# Patient Record
Sex: Male | Born: 1975 | Hispanic: Yes | Marital: Single | Smoking: Current every day smoker
Health system: Southern US, Community
[De-identification: ages and names within clinical notes are randomized; demographics above are authoritative.]

## PROBLEM LIST (undated history)

## (undated) DIAGNOSIS — R0989 Other specified symptoms and signs involving the circulatory and respiratory systems: Secondary | ICD-10-CM

## (undated) DIAGNOSIS — I10 Essential (primary) hypertension: Secondary | ICD-10-CM

## (undated) DIAGNOSIS — F209 Schizophrenia, unspecified: Secondary | ICD-10-CM

## (undated) DIAGNOSIS — J45909 Unspecified asthma, uncomplicated: Secondary | ICD-10-CM

## (undated) DIAGNOSIS — F419 Anxiety disorder, unspecified: Secondary | ICD-10-CM

## (undated) HISTORY — DX: Anxiety disorder, unspecified: F41.9

## (undated) HISTORY — DX: Other specified symptoms and signs involving the circulatory and respiratory systems: R09.89

## (undated) HISTORY — DX: Essential (primary) hypertension: I10

---

## 2002-01-23 ENCOUNTER — Inpatient Hospital Stay (HOSPITAL_COMMUNITY): Admission: EM | Admit: 2002-01-23 | Discharge: 2002-01-30 | Payer: Self-pay | Admitting: *Deleted

## 2002-01-27 ENCOUNTER — Encounter (HOSPITAL_COMMUNITY): Payer: Self-pay | Admitting: Psychiatry

## 2002-02-17 ENCOUNTER — Emergency Department (HOSPITAL_COMMUNITY): Admission: EM | Admit: 2002-02-17 | Discharge: 2002-02-18 | Payer: Self-pay | Admitting: Emergency Medicine

## 2002-02-18 ENCOUNTER — Inpatient Hospital Stay (HOSPITAL_COMMUNITY): Admission: EM | Admit: 2002-02-18 | Discharge: 2002-02-21 | Payer: Self-pay | Admitting: Psychiatry

## 2002-02-18 ENCOUNTER — Encounter: Payer: Self-pay | Admitting: Emergency Medicine

## 2002-04-23 ENCOUNTER — Inpatient Hospital Stay (HOSPITAL_COMMUNITY): Admission: EM | Admit: 2002-04-23 | Discharge: 2002-05-01 | Payer: Self-pay | Admitting: Psychiatry

## 2002-05-08 ENCOUNTER — Inpatient Hospital Stay (HOSPITAL_COMMUNITY): Admission: EM | Admit: 2002-05-08 | Discharge: 2002-05-17 | Payer: Self-pay | Admitting: Psychiatry

## 2002-05-18 ENCOUNTER — Emergency Department (HOSPITAL_COMMUNITY): Admission: EM | Admit: 2002-05-18 | Discharge: 2002-05-18 | Payer: Self-pay | Admitting: Emergency Medicine

## 2002-06-01 ENCOUNTER — Emergency Department (HOSPITAL_COMMUNITY): Admission: EM | Admit: 2002-06-01 | Discharge: 2002-06-01 | Payer: Self-pay | Admitting: Emergency Medicine

## 2002-09-19 ENCOUNTER — Emergency Department (HOSPITAL_COMMUNITY): Admission: EM | Admit: 2002-09-19 | Discharge: 2002-09-19 | Payer: Self-pay | Admitting: Emergency Medicine

## 2004-09-09 ENCOUNTER — Ambulatory Visit: Payer: Self-pay | Admitting: Psychiatry

## 2004-09-09 ENCOUNTER — Inpatient Hospital Stay (HOSPITAL_COMMUNITY): Admission: RE | Admit: 2004-09-09 | Discharge: 2004-09-18 | Payer: Self-pay | Admitting: Psychiatry

## 2005-02-25 ENCOUNTER — Inpatient Hospital Stay (HOSPITAL_COMMUNITY): Admission: AD | Admit: 2005-02-25 | Discharge: 2005-03-05 | Payer: Self-pay | Admitting: Psychiatry

## 2005-02-26 ENCOUNTER — Ambulatory Visit: Payer: Self-pay | Admitting: Psychiatry

## 2005-03-11 ENCOUNTER — Inpatient Hospital Stay (HOSPITAL_COMMUNITY): Admission: EM | Admit: 2005-03-11 | Discharge: 2005-03-17 | Payer: Self-pay | Admitting: Psychiatry

## 2005-03-22 ENCOUNTER — Inpatient Hospital Stay (HOSPITAL_COMMUNITY): Admission: RE | Admit: 2005-03-22 | Discharge: 2005-04-02 | Payer: Self-pay | Admitting: Psychiatry

## 2005-03-25 ENCOUNTER — Ambulatory Visit: Payer: Self-pay | Admitting: Psychiatry

## 2005-04-28 ENCOUNTER — Inpatient Hospital Stay (HOSPITAL_COMMUNITY): Admission: EM | Admit: 2005-04-28 | Discharge: 2005-05-04 | Payer: Self-pay | Admitting: Psychiatry

## 2005-04-28 ENCOUNTER — Ambulatory Visit: Payer: Self-pay | Admitting: Psychiatry

## 2010-12-25 DIAGNOSIS — F329 Major depressive disorder, single episode, unspecified: Secondary | ICD-10-CM

## 2010-12-25 DIAGNOSIS — F419 Anxiety disorder, unspecified: Secondary | ICD-10-CM

## 2010-12-25 DIAGNOSIS — I10 Essential (primary) hypertension: Secondary | ICD-10-CM

## 2010-12-25 DIAGNOSIS — E119 Type 2 diabetes mellitus without complications: Secondary | ICD-10-CM | POA: Insufficient documentation

## 2010-12-25 DIAGNOSIS — R0989 Other specified symptoms and signs involving the circulatory and respiratory systems: Secondary | ICD-10-CM

## 2013-03-09 ENCOUNTER — Ambulatory Visit (INDEPENDENT_AMBULATORY_CARE_PROVIDER_SITE_OTHER): Payer: Medicare Other

## 2013-03-09 VITALS — BP 107/73 | HR 76 | Resp 16

## 2013-03-09 DIAGNOSIS — E1142 Type 2 diabetes mellitus with diabetic polyneuropathy: Secondary | ICD-10-CM

## 2013-03-09 DIAGNOSIS — E1140 Type 2 diabetes mellitus with diabetic neuropathy, unspecified: Secondary | ICD-10-CM

## 2013-03-09 DIAGNOSIS — L608 Other nail disorders: Secondary | ICD-10-CM

## 2013-03-09 DIAGNOSIS — E1149 Type 2 diabetes mellitus with other diabetic neurological complication: Secondary | ICD-10-CM

## 2013-03-09 NOTE — Patient Instructions (Signed)

## 2013-03-09 NOTE — Progress Notes (Signed)
  Subjective:    Patient ID: Tony Cunningham, male    DOB: 1975-07-04, 37 y.o.   MRN: [id number]  HPI patient presents for followup diabetic foot and nail care in debridement of nails. No new changes medication her health status. Nails thick and incurvated in ingrowing. TRIM NAILS  Review of Systems  HENT: Positive for sinus pressure.   All other systems reviewed and are negative.       Objective:   Physical Exam  Constitutional: He is oriented to person, place, and time. He appears well-developed and well-nourished.  Cardiovascular:  Pulses:      Dorsalis pedis pulses are 2+ on the right side, and 2+ on the left side.       Posterior tibial pulses are 1+ on the right side, and 1+ on the left side.  Capillary refill timed 3-4 seconds all digits bilateral. Skin temperature warm. No varicosities noted. No edema noted. No open wounds or ulcerations.  Musculoskeletal:  Rectus foot type bilateral mild flexible digital contractures noted bilateral with adductovarus rotation . Normal muscle strengths.  Neurological: He is alert and oriented to person, place, and time. He has normal strength and normal reflexes.  Epicritic sensation is intact although diminished on Semmes Weinstein testing to distal digits. There is normal plantar response. DTRs unremarkable.  Skin: Skin is warm and dry. No cyanosis. Nails show no clubbing.  Skin color and pigment normal hair growth diminished bilateral temperature cool. Nails thickened and yellowed incurvated and brittle and friable tender on palpation and debridement.  Psychiatric: He has a normal mood and affect. His behavior is normal.          Assessment & Plan:  Systems diabetes with mild neuropathy cocking factors. Plan at this time is debridement of nails x10 the presence diabetes complications Tony Cunningham for future palliative care in an as-needed basis recommended 3 month followup  Alvan Dame DPM

## 2013-06-15 ENCOUNTER — Ambulatory Visit (INDEPENDENT_AMBULATORY_CARE_PROVIDER_SITE_OTHER): Payer: Medicare Other

## 2013-06-15 VITALS — BP 107/50 | HR 89 | Resp 18

## 2013-06-15 DIAGNOSIS — E1142 Type 2 diabetes mellitus with diabetic polyneuropathy: Secondary | ICD-10-CM

## 2013-06-15 DIAGNOSIS — L608 Other nail disorders: Secondary | ICD-10-CM

## 2013-06-15 DIAGNOSIS — E1140 Type 2 diabetes mellitus with diabetic neuropathy, unspecified: Secondary | ICD-10-CM

## 2013-06-15 DIAGNOSIS — E1149 Type 2 diabetes mellitus with other diabetic neurological complication: Secondary | ICD-10-CM

## 2013-06-15 NOTE — Patient Instructions (Signed)
Diabetes and Foot Care Diabetes may cause you to have problems because of poor blood supply (circulation) to your feet and legs. This may cause the skin on your feet to become thinner, break easier, and heal more slowly. Your skin may become dry, and the skin may peel and crack. You may also have nerve damage in your legs and feet causing decreased feeling in them. You may not notice minor injuries to your feet that could lead to infections or more serious problems. Taking care of your feet is one of the most important things you can do for yourself.  HOME CARE INSTRUCTIONS  Wear shoes at all times, even in the house. Do not go barefoot. Bare feet are easily injured.  Check your feet daily for blisters, cuts, and redness. If you cannot see the bottom of your feet, use a mirror or ask someone for help.  Wash your feet with warm water (do not use hot water) and mild soap. Then pat your feet and the areas between your toes until they are completely dry. Do not soak your feet as this can dry your skin.  Apply a moisturizing lotion or petroleum jelly (that does not contain alcohol and is unscented) to the skin on your feet and to dry, brittle toenails. Do not apply lotion between your toes.  Trim your toenails straight across. Do not dig under them or around the cuticle. File the edges of your nails with an emery board or nail file.  Do not cut corns or calluses or try to remove them with medicine.  Wear clean socks or stockings every day. Make sure they are not too tight. Do not wear knee-high stockings since they may decrease blood flow to your legs.  Wear shoes that fit properly and have enough cushioning. To break in new shoes, wear them for just a few hours a day. This prevents you from injuring your feet. Always look in your shoes before you put them on to be sure there are no objects inside.  Do not cross your legs. This may decrease the blood flow to your feet.  If you find a minor scrape,  cut, or break in the skin on your feet, keep it and the skin around it clean and dry. These areas may be cleansed with mild soap and water. Do not cleanse the area with peroxide, alcohol, or iodine.  When you remove an adhesive bandage, be sure not to damage the skin around it.  If you have a wound, look at it several times a day to make sure it is healing.  Do not use heating pads or hot water bottles. They may burn your skin. If you have lost feeling in your feet or legs, you may not know it is happening until it is too late.  Make sure your health care provider performs a complete foot exam at least annually or more often if you have foot problems. Report any cuts, sores, or bruises to your health care provider immediately. SEEK MEDICAL CARE IF:   You have an injury that is not healing.  You have cuts or breaks in the skin.  You have an ingrown nail.  You notice redness on your legs or feet.  You feel burning or tingling in your legs or feet.  You have pain or cramps in your legs and feet.  Your legs or feet are numb.  Your feet always feel cold. SEEK IMMEDIATE MEDICAL CARE IF:   There is increasing redness,   swelling, or pain in or around a wound.  There is a red line that goes up your leg.  Pus is coming from a wound.  You develop a fever or as directed by your health care provider.  You notice a bad smell coming from an ulcer or wound. Document Released: 05/08/2000 Document Revised: 01/11/2013 Document Reviewed: 10/18/2012 Exit[employer] Patient Information 2014 [employer].  

## 2013-06-15 NOTE — Progress Notes (Addendum)
   Subjective:    Patient ID: Tony Cunningham, male    DOB: 1975-09-05, 38 y.o.   MRN: [id number]  HPI I am here to get my toenails trimmed    Review of Systems no new changes or find     Objective:   Physical Exam Vascular status is intact as follows DP +2/4 bilateral PT plus one over 4 bilateral Refill timed 3-4 seconds all digits skin temperature warm turgor diminished no edema rubor pallor or varicosities noted neurologically epicritic and proprioceptive sensations decreased on Semmes Weinstein testing absent sensation distal digits and plantar forefoot and arch. There is normal plantar response DTRs are listed dermatologically skin color pigment normal hair growth present but diminished nails criptotic incurvated friable and brittle one through 5 bilateral. There is no open wounds ulcerations no secondary infection is noted current time findings consistent with onychomycosis and friability the nails noted that exam.       Assessment & Plan:  Assessment diabetes with peripheral neuropathy and complications thick dystrophic friable gratified nails debridement presence of diabetes and complications return for future palliative care and as-needed basis suggest a 3 month followup maintain continued cream or lotion to the dry skin daily also topical antifungal as needed to the affected nail such as Fungi-Nail. Next  Alvan Dame DPM  Addendum to note patient has also been approved for diabetic accident shoes 3 primary physician at this time patient is measured for Extra-depth shoes and 3 pairs of custom molded dual density Plastizote inlays. Patient does have diabetes with complicating factors digital contractures with history keratoses no current ulcer or no infection or patient has had diabetic shoes in the past with good success need replacing at this time as recommended. Patient will be contacted in the same for fitting and dispensing when shoes are ready next  Alvan Dame DPM

## 2013-09-14 ENCOUNTER — Ambulatory Visit (INDEPENDENT_AMBULATORY_CARE_PROVIDER_SITE_OTHER): Payer: Medicare Other

## 2013-09-14 VITALS — BP 104/67 | HR 74 | Resp 18

## 2013-09-14 DIAGNOSIS — E1140 Type 2 diabetes mellitus with diabetic neuropathy, unspecified: Secondary | ICD-10-CM

## 2013-09-14 DIAGNOSIS — L608 Other nail disorders: Secondary | ICD-10-CM

## 2013-09-14 DIAGNOSIS — E1149 Type 2 diabetes mellitus with other diabetic neurological complication: Secondary | ICD-10-CM

## 2013-09-14 NOTE — Patient Instructions (Signed)
Diabetes and Foot Care Diabetes may cause you to have problems because of poor blood supply (circulation) to your feet and legs. This may cause the skin on your feet to become thinner, break easier, and heal more slowly. Your skin may become dry, and the skin may peel and crack. You may also have nerve damage in your legs and feet causing decreased feeling in them. You may not notice minor injuries to your feet that could lead to infections or more serious problems. Taking care of your feet is one of the most important things you can do for yourself.  HOME CARE INSTRUCTIONS  Wear shoes at all times, even in the house. Do not go barefoot. Bare feet are easily injured.  Check your feet daily for blisters, cuts, and redness. If you cannot see the bottom of your feet, use a mirror or ask someone for help.  Wash your feet with warm water (do not use hot water) and mild soap. Then pat your feet and the areas between your toes until they are completely dry. Do not soak your feet as this can dry your skin.  Apply a moisturizing lotion or petroleum jelly (that does not contain alcohol and is unscented) to the skin on your feet and to dry, brittle toenails. Do not apply lotion between your toes.  Trim your toenails straight across. Do not dig under them or around the cuticle. File the edges of your nails with an emery board or nail file.  Do not cut corns or calluses or try to remove them with medicine.  Wear clean socks or stockings every day. Make sure they are not too tight. Do not wear knee-high stockings since they may decrease blood flow to your legs.  Wear shoes that fit properly and have enough cushioning. To break in new shoes, wear them for just a few hours a day. This prevents you from injuring your feet. Always look in your shoes before you put them on to be sure there are no objects inside.  Do not cross your legs. This may decrease the blood flow to your feet.  If you find a minor scrape,  cut, or break in the skin on your feet, keep it and the skin around it clean and dry. These areas may be cleansed with mild soap and water. Do not cleanse the area with peroxide, alcohol, or iodine.  When you remove an adhesive bandage, be sure not to damage the skin around it.  If you have a wound, look at it several times a day to make sure it is healing.  Do not use heating pads or hot water bottles. They may burn your skin. If you have lost feeling in your feet or legs, you may not know it is happening until it is too late.  Make sure your health care provider performs a complete foot exam at least annually or more often if you have foot problems. Report any cuts, sores, or bruises to your health care provider immediately. SEEK MEDICAL CARE IF:   You have an injury that is not healing.  You have cuts or breaks in the skin.  You have an ingrown nail.  You notice redness on your legs or feet.  You feel burning or tingling in your legs or feet.  You have pain or cramps in your legs and feet.  Your legs or feet are numb.  Your feet always feel cold. SEEK IMMEDIATE MEDICAL CARE IF:   There is increasing redness,   swelling, or pain in or around a wound.  There is a red line that goes up your leg.  Pus is coming from a wound.  You develop a fever or as directed by your health care provider.  You notice a bad smell coming from an ulcer or wound. Document Released: 05/08/2000 Document Revised: 01/11/2013 Document Reviewed: 10/18/2012 Exit[employer] Patient Information 2014 [employer].  

## 2013-09-14 NOTE — Progress Notes (Signed)
   Subjective:    Patient ID: Noel Rodier, male    DOB: 08/01/1975, 38 y.o.   MRN: [id number]  HPI I am here to get my toenails trimmed up    Review of Systems no new systemic changes or findings noted     Objective:   Physical Exam Lower extremity objective findings as follows pedal pulses palpable DP +2/4 bilateral PT plus one over 4 bilateral capillary refill timed 3-4 seconds all digits skin temperature warm turgor diminished there is no edema rubor pallor or varicosities noted. Neurologic epicritic and proprioceptive sensations intact although diminished on Semmes Weinstein testing to the digits and plantar forefoot. There is normal plantar response and DTRs noted. Dermatologically skin color pigment normal hair growth absent but diminished distally nails somewhat thick brittle criptotic incurvated 1 through 5 bilateral. No open wounds no infection is no discharge or drainage. Orthopedic exam reveals rectus foot type mild flexible digital contractures are noted no other osseous abnormalities noted.       Assessment & Plan:  Assessment this time his diabetes with mild peripheral neuropathy and complications thick brittle friable criptotic nails debrided 1 through 5 bilateral the presence of diabetes and neuropathy. Return in 3 months for followup and continued palliative care patient had been measured for diabetic shoes however the shoes and he selected our backorder will be contacted immediately as soon as those are available for fitting and dispensing.  Alvan Dame DPM

## 2013-12-21 ENCOUNTER — Ambulatory Visit: Payer: Medicare Other

## 2014-08-20 ENCOUNTER — Ambulatory Visit: Payer: Medicare Other

## 2015-09-19 ENCOUNTER — Other Ambulatory Visit: Payer: Self-pay | Admitting: Orthopedic Surgery

## 2015-09-19 DIAGNOSIS — M4306 Spondylolysis, lumbar region: Secondary | ICD-10-CM

## 2015-10-02 ENCOUNTER — Ambulatory Visit
Admission: RE | Admit: 2015-10-02 | Discharge: 2015-10-02 | Disposition: A | Discharge status: Home / Self Care | Payer: Medicare Other | Source: Ambulatory Visit | Attending: Orthopedic Surgery | Admitting: Orthopedic Surgery

## 2015-10-02 DIAGNOSIS — M4306 Spondylolysis, lumbar region: Secondary | ICD-10-CM

## 2015-10-02 MED ORDER — IOPAMIDOL (ISOVUE-M 200) INJECTION 41%
15.0000 mL | Freq: Once | INTRAMUSCULAR | Status: AC
  Administered 2015-10-02: 15 mL via INTRATHECAL

## 2015-10-02 MED ORDER — DIAZEPAM 5 MG PO TABS
10.0000 mg | ORAL_TABLET | Freq: Once | ORAL | Status: AC
  Administered 2015-10-02: 10 mg via ORAL

## 2015-10-02 NOTE — Discharge Instructions (Signed)
Myelogram Discharge Instructions  1. Go home and rest quietly for the next 24 hours.  It is important to lie flat for the next 24 hours.  Get up only to go to the restroom.  You may lie in the bed or on a couch on your back, your stomach, your left side or your right side.  You may have one pillow under your head.  You may have pillows between your knees while you are on your side or under your knees while you are on your back.  2. DO NOT drive today.  Recline the seat as far back as it will go, while still wearing your seat belt, on the way home.  3. You may get up to go to the bathroom as needed.  You may sit up for 10 minutes to eat.  You may resume your normal diet and medications unless otherwise indicated.  Drink lots of extra fluids today and tomorrow.  4. The incidence of headache, nausea, or vomiting is about 5% (one in 20 patients).  If you develop a headache, lie flat and drink plenty of fluids until the headache goes away.  Caffeinated beverages may be helpful.  If you develop severe nausea and vomiting or a headache that does not go away with flat bed rest, call [phone].  5. You may resume normal activities after your 24 hours of bed rest is over; however, do not exert yourself strongly or do any heavy lifting tomorrow. If when you get up you have a headache when standing, go back to bed and force fluids for another 24 hours.  6. Call your physician for a follow-up appointment.  The results of your myelogram will be sent directly to your physician by the following day.  7. If you have any questions or if complications develop after you arrive home, please call [phone].  Discharge instructions have been explained to the patient.  The patient, or the person responsible for the patient, fully understands these instructions.      May resume Remoron and Pristiq on Oct 03, 2015, after 11:00 am.

## 2015-10-02 NOTE — Progress Notes (Signed)
Patient states he has been off Pristiq and Remeron for at least the past two days.

## 2016-01-22 DIAGNOSIS — E875 Hyperkalemia: Secondary | ICD-10-CM

## 2016-01-22 DIAGNOSIS — F1110 Opioid abuse, uncomplicated: Secondary | ICD-10-CM | POA: Diagnosis not present

## 2016-01-22 DIAGNOSIS — F410 Panic disorder [episodic paroxysmal anxiety] without agoraphobia: Secondary | ICD-10-CM | POA: Diagnosis not present

## 2016-01-22 DIAGNOSIS — M549 Dorsalgia, unspecified: Secondary | ICD-10-CM | POA: Diagnosis not present

## 2016-01-22 DIAGNOSIS — R079 Chest pain, unspecified: Secondary | ICD-10-CM | POA: Diagnosis not present

## 2016-01-22 DIAGNOSIS — I10 Essential (primary) hypertension: Secondary | ICD-10-CM | POA: Diagnosis not present

## 2016-01-22 DIAGNOSIS — F329 Major depressive disorder, single episode, unspecified: Secondary | ICD-10-CM | POA: Diagnosis not present

## 2016-01-22 DIAGNOSIS — E119 Type 2 diabetes mellitus without complications: Secondary | ICD-10-CM

## 2016-01-23 DIAGNOSIS — E875 Hyperkalemia: Secondary | ICD-10-CM | POA: Diagnosis not present

## 2016-01-23 DIAGNOSIS — I10 Essential (primary) hypertension: Secondary | ICD-10-CM | POA: Diagnosis not present

## 2016-01-23 DIAGNOSIS — R079 Chest pain, unspecified: Secondary | ICD-10-CM | POA: Diagnosis not present

## 2016-01-23 DIAGNOSIS — F1110 Opioid abuse, uncomplicated: Secondary | ICD-10-CM | POA: Diagnosis not present

## 2016-01-23 DIAGNOSIS — M549 Dorsalgia, unspecified: Secondary | ICD-10-CM | POA: Diagnosis not present

## 2016-01-23 DIAGNOSIS — E119 Type 2 diabetes mellitus without complications: Secondary | ICD-10-CM | POA: Diagnosis not present

## 2016-01-23 DIAGNOSIS — F329 Major depressive disorder, single episode, unspecified: Secondary | ICD-10-CM | POA: Diagnosis not present

## 2016-01-23 DIAGNOSIS — F410 Panic disorder [episodic paroxysmal anxiety] without agoraphobia: Secondary | ICD-10-CM | POA: Diagnosis not present

## 2016-02-07 ENCOUNTER — Encounter (HOSPITAL_COMMUNITY): Payer: Self-pay

## 2016-02-07 ENCOUNTER — Inpatient Hospital Stay (HOSPITAL_COMMUNITY)
Admission: AD | Admit: 2016-02-07 | Discharge: 2016-02-21 | DRG: 885 | Disposition: A | Discharge status: Home / Self Care | Payer: Medicare Other | Source: Intra-hospital | Attending: Psychiatry | Admitting: Psychiatry

## 2016-02-07 DIAGNOSIS — G4700 Insomnia, unspecified: Secondary | ICD-10-CM | POA: Diagnosis present

## 2016-02-07 DIAGNOSIS — J45909 Unspecified asthma, uncomplicated: Secondary | ICD-10-CM | POA: Diagnosis present

## 2016-02-07 DIAGNOSIS — Z794 Long term (current) use of insulin: Secondary | ICD-10-CM | POA: Diagnosis not present

## 2016-02-07 DIAGNOSIS — F333 Major depressive disorder, recurrent, severe with psychotic symptoms: Secondary | ICD-10-CM | POA: Diagnosis present

## 2016-02-07 DIAGNOSIS — F17210 Nicotine dependence, cigarettes, uncomplicated: Secondary | ICD-10-CM | POA: Diagnosis present

## 2016-02-07 DIAGNOSIS — R45851 Suicidal ideations: Secondary | ICD-10-CM | POA: Diagnosis present

## 2016-02-07 DIAGNOSIS — E119 Type 2 diabetes mellitus without complications: Secondary | ICD-10-CM | POA: Diagnosis present

## 2016-02-07 DIAGNOSIS — Z79899 Other long term (current) drug therapy: Secondary | ICD-10-CM | POA: Diagnosis not present

## 2016-02-07 HISTORY — DX: Unspecified asthma, uncomplicated: J45.909

## 2016-02-07 LAB — GLUCOSE, CAPILLARY
GLUCOSE-CAPILLARY: 524 mg/dL — AB (ref 65–99)
Glucose-Capillary: 473 mg/dL — ABNORMAL HIGH (ref 65–99)

## 2016-02-07 MED ORDER — LISINOPRIL 20 MG PO TABS
20.0000 mg | ORAL_TABLET | Freq: Every day | ORAL | Status: DC
  Administered 2016-02-08 – 2016-02-21 (×11): 20 mg via ORAL
  Filled 2016-02-07 (×15): qty 1

## 2016-02-07 MED ORDER — HYDROCHLOROTHIAZIDE 25 MG PO TABS
25.0000 mg | ORAL_TABLET | Freq: Every day | ORAL | Status: DC
  Administered 2016-02-08 – 2016-02-21 (×12): 25 mg via ORAL
  Filled 2016-02-07 (×15): qty 1

## 2016-02-07 MED ORDER — INSULIN ASPART 100 UNIT/ML ~~LOC~~ SOLN
15.0000 [IU] | Freq: Once | SUBCUTANEOUS | Status: AC
Start: 2016-02-07 — End: 2016-02-07
  Administered 2016-02-07: 15 [IU] via SUBCUTANEOUS

## 2016-02-07 MED ORDER — VENLAFAXINE HCL ER 150 MG PO CP24
150.0000 mg | ORAL_CAPSULE | Freq: Every day | ORAL | Status: DC
  Administered 2016-02-08 – 2016-02-12 (×5): 150 mg via ORAL
  Filled 2016-02-07 (×7): qty 1

## 2016-02-07 MED ORDER — INSULIN ASPART 100 UNIT/ML ~~LOC~~ SOLN: 0.0000 [IU] | Freq: Three times a day (TID) | SUBCUTANEOUS | Status: DC

## 2016-02-07 MED ORDER — TRAZODONE HCL 50 MG PO TABS
50.0000 mg | ORAL_TABLET | Freq: Every evening | ORAL | Status: DC | PRN
  Filled 2016-02-07 (×2): qty 1

## 2016-02-07 MED ORDER — ACETAMINOPHEN 325 MG PO TABS
650.0000 mg | ORAL_TABLET | Freq: Four times a day (QID) | ORAL | Status: DC | PRN
  Administered 2016-02-08 – 2016-02-18 (×10): 650 mg via ORAL
  Filled 2016-02-07 (×9): qty 2

## 2016-02-07 MED ORDER — INSULIN ASPART 100 UNIT/ML ~~LOC~~ SOLN
0.0000 [IU] | Freq: Every day | SUBCUTANEOUS | Status: DC
  Administered 2016-02-09: 2 [IU] via SUBCUTANEOUS
  Administered 2016-02-11: 5 [IU] via SUBCUTANEOUS
  Administered 2016-02-12: 2 [IU] via SUBCUTANEOUS
  Administered 2016-02-13: 4 [IU] via SUBCUTANEOUS
  Administered 2016-02-14: 2 [IU] via SUBCUTANEOUS
  Administered 2016-02-15: 3 [IU] via SUBCUTANEOUS
  Administered 2016-02-17 – 2016-02-20 (×2): 2 [IU] via SUBCUTANEOUS

## 2016-02-07 MED ORDER — INSULIN ASPART 100 UNIT/ML ~~LOC~~ SOLN
0.0000 [IU] | Freq: Three times a day (TID) | SUBCUTANEOUS | Status: DC
  Administered 2016-02-07: 20 [IU] via SUBCUTANEOUS
  Administered 2016-02-08 (×2): 4 [IU] via SUBCUTANEOUS
  Administered 2016-02-09: 20 [IU] via SUBCUTANEOUS
  Administered 2016-02-09 (×2): 7 [IU] via SUBCUTANEOUS
  Administered 2016-02-10: 20 [IU] via SUBCUTANEOUS
  Administered 2016-02-10 (×2): 7 [IU] via SUBCUTANEOUS
  Administered 2016-02-11: 4 [IU] via SUBCUTANEOUS
  Administered 2016-02-11: 7 [IU] via SUBCUTANEOUS
  Administered 2016-02-11 – 2016-02-12 (×2): 4 [IU] via SUBCUTANEOUS
  Administered 2016-02-12: 20 [IU] via SUBCUTANEOUS
  Administered 2016-02-12 – 2016-02-13 (×3): 7 [IU] via SUBCUTANEOUS
  Administered 2016-02-13: 3 [IU] via SUBCUTANEOUS
  Administered 2016-02-14: 12:00:00 via SUBCUTANEOUS
  Administered 2016-02-14 (×2): 7 [IU] via SUBCUTANEOUS
  Administered 2016-02-15: 4 [IU] via SUBCUTANEOUS
  Administered 2016-02-15: 11 [IU] via SUBCUTANEOUS
  Administered 2016-02-15: 7 [IU] via SUBCUTANEOUS
  Administered 2016-02-16: 4 [IU] via SUBCUTANEOUS
  Administered 2016-02-16: 15 [IU] via SUBCUTANEOUS
  Administered 2016-02-16: 11 [IU] via SUBCUTANEOUS
  Administered 2016-02-17: 7 [IU] via SUBCUTANEOUS
  Administered 2016-02-17: 4 [IU] via SUBCUTANEOUS
  Administered 2016-02-17: 11 [IU] via SUBCUTANEOUS
  Administered 2016-02-18: 4 [IU] via SUBCUTANEOUS
  Administered 2016-02-18: 7 [IU] via SUBCUTANEOUS
  Administered 2016-02-18: 3 [IU] via SUBCUTANEOUS
  Administered 2016-02-19: 4 [IU] via SUBCUTANEOUS
  Administered 2016-02-19: 20 [IU] via SUBCUTANEOUS
  Administered 2016-02-19: 4 [IU] via SUBCUTANEOUS
  Administered 2016-02-20: 7 [IU] via SUBCUTANEOUS
  Administered 2016-02-20: 3 [IU] via SUBCUTANEOUS
  Administered 2016-02-20: 4 [IU] via SUBCUTANEOUS
  Administered 2016-02-21: 3 [IU] via SUBCUTANEOUS

## 2016-02-07 MED ORDER — SIMVASTATIN 40 MG PO TABS
40.0000 mg | ORAL_TABLET | Freq: Every day | ORAL | Status: DC
  Administered 2016-02-07 – 2016-02-20 (×14): 40 mg via ORAL
  Filled 2016-02-07: qty 2
  Filled 2016-02-07: qty 1
  Filled 2016-02-07: qty 2
  Filled 2016-02-07 (×14): qty 1

## 2016-02-07 MED ORDER — HYDROXYZINE HCL 25 MG PO TABS
25.0000 mg | ORAL_TABLET | Freq: Three times a day (TID) | ORAL | Status: DC | PRN
  Administered 2016-02-07 – 2016-02-20 (×20): 25 mg via ORAL
  Filled 2016-02-07 (×20): qty 1

## 2016-02-07 MED ORDER — LISINOPRIL-HYDROCHLOROTHIAZIDE 20-25 MG PO TABS: 1.0000 | ORAL_TABLET | Freq: Every day | ORAL | Status: DC

## 2016-02-07 MED ORDER — PREGABALIN 100 MG PO CAPS
100.0000 mg | ORAL_CAPSULE | Freq: Three times a day (TID) | ORAL | Status: DC
  Administered 2016-02-08 – 2016-02-21 (×40): 100 mg via ORAL
  Filled 2016-02-07 (×40): qty 1

## 2016-02-07 MED ORDER — INFLUENZA VAC SPLIT QUAD 0.5 ML IM SUSY
0.5000 mL | PREFILLED_SYRINGE | INTRAMUSCULAR | Status: DC
  Filled 2016-02-07: qty 0.5

## 2016-02-07 MED ORDER — PREDNISONE 20 MG PO TABS
20.0000 mg | ORAL_TABLET | Freq: Every day | ORAL | Status: AC
Start: 2016-02-08 — End: 2016-02-10
  Administered 2016-02-08 – 2016-02-10 (×3): 20 mg via ORAL
  Filled 2016-02-07 (×5): qty 1

## 2016-02-07 MED ORDER — MAGNESIUM HYDROXIDE 400 MG/5ML PO SUSP: 30.0000 mL | Freq: Every day | ORAL | Status: DC | PRN

## 2016-02-07 MED ORDER — MONTELUKAST SODIUM 10 MG PO TABS
10.0000 mg | ORAL_TABLET | Freq: Every day | ORAL | Status: DC
  Administered 2016-02-07 – 2016-02-20 (×14): 10 mg via ORAL
  Filled 2016-02-07 (×16): qty 1

## 2016-02-07 MED ORDER — MIRTAZAPINE 30 MG PO TABS
30.0000 mg | ORAL_TABLET | Freq: Every day | ORAL | Status: DC
  Administered 2016-02-07 – 2016-02-09 (×3): 30 mg via ORAL
  Filled 2016-02-07 (×6): qty 1

## 2016-02-07 MED ORDER — INSULIN ASPART 100 UNIT/ML ~~LOC~~ SOLN
6.0000 [IU] | Freq: Three times a day (TID) | SUBCUTANEOUS | Status: DC
  Administered 2016-02-08 – 2016-02-13 (×17): 6 [IU] via SUBCUTANEOUS

## 2016-02-07 MED ORDER — ALUM & MAG HYDROXIDE-SIMETH 200-200-20 MG/5ML PO SUSP: 30.0000 mL | ORAL | Status: DC | PRN

## 2016-02-07 NOTE — Progress Notes (Addendum)
Patient ID: Tony Cunningham, male   DOB: 17-Sep-1975, 40 y.o.   MRN: [id number] Received report from [facility]. RN. D: writer introduced self to client. Client expresses anxiety. A: Medications given, (see MAR). Staffed with Drenda Freeze, NP client CBG of 473. Instructed to give 20 units of Novolog, after  meal that was given on admit. Then follow up with NP for further orders after CBG in 1 hr. Staff will monitor q45min for safety. R: Client is safe on the unit.

## 2016-02-07 NOTE — Progress Notes (Signed)
Patient ID: Tony Cunningham, male   DOB: 02-10-1976, 40 y.o.   MRN: [id number] Reported given to [name], RN. For further assessment and follow up orders.

## 2016-02-07 NOTE — BH Assessment (Signed)
Tele Assessment Note  *assessed at [facility]   Tony Cunningham is an 40 y.o. male who presents to hospital with complaints of suicidal ideations with plan to overdose on "a handful of medication". These thoughts started 1.5 days ago. He states that he has a history of depression and it has been increasing lately. He has a history of inpatient admissions but could not provide dates. He states that he has been hearing voices as well that "sound like mumbling". Pt was flat and depressed with slow and soft speech. He states that he is having a hard time concentrating and it is difficult for him to talk right now. He states that he currently lives with his mom and they have a lot of conflict. He did not elaborate on what the conflict was. Pt states he is unemployed right now. He reports not being able to sleep and fair appetite. He states that he has an outpatient medication management provider and is taking his medications as prescribed. Pt denies HI or history of violence or aggression.    Diagnosis: Major Depressive Disorder Recurrent Severe with psychotic features   Past Medical History:  Past Medical History:  Diagnosis Date  . Anxiety   . Diabetes mellitus   . High blood pressure   . Sinus complaint     No past surgical history on file.  Family History: No family history on file.  Social History:  reports that he has been smoking Cigarettes.  He has been smoking about 0.50 packs per day. He has never used smokeless tobacco. He reports that he does not drink alcohol or use drugs.  Additional Social History:  Alcohol / Drug Use History of alcohol / drug use?: No history of alcohol / drug abuse  CIWA:   COWS:    PATIENT STRENGTHS: (choose at least two) Average or above average intelligence Motivation for treatment/growth  Allergies:  Allergies  Allergen Reactions  . Seroquel [Quetiapine Fumerate]     Home Medications:  (Not in a hospital admission)  OB/GYN Status:  No LMP for male  patient.  General Assessment Data Location of Assessment:  [facility]) TTS Assessment: Out of system Is this a Tele or Face-to-Face Assessment?: Tele Assessment (Out of system) Is this an Initial Assessment or a Re-assessment for this encounter?: Initial Assessment Marital status: Single Living Arrangements: Parent (Mom) Can pt return to current living arrangement?: Yes Admission Status: Voluntary Is patient capable of signing voluntary admission?: Yes Referral Source: Self/Family/Friend Insurance type: Medicare     Crisis Care Plan Living Arrangements: Parent (Mom) Legal Guardian: Other: (Self) Name of Psychiatrist: "can't remember" but states he has a provider Name of Therapist: no  Education Status Is patient currently in school?: No Highest grade of school patient has completed: 11th Name of school: NA Contact person: NA  Risk to self with the past 6 months Suicidal Ideation: Yes-Currently Present Has patient been a risk to self within the past 6 months prior to admission? : Yes Suicidal Intent: Yes-Currently Present Has patient had any suicidal intent within the past 6 months prior to admission? : Yes Is patient at risk for suicide?: Yes Suicidal Plan?: Yes-Currently Present Has patient had any suicidal plan within the past 6 months prior to admission? : Yes Specify Current Suicidal Plan: Overdose on a "handful of medication" Access to Means: Yes Specify Access to Suicidal Means: access to meds What has been your use of drugs/alcohol within the last 12 months?: denies use of drugs or alcohol Previous Attempts/Gestures:  No Triggers for Past Attempts: None known Intentional Self Injurious Behavior: None Family Suicide History: Unknown Recent stressful life event(s): Conflict (Comment) (conflict with mom) Persecutory voices/beliefs?: Yes Depression: Yes Depression Symptoms: Despondent, Fatigue, Feeling worthless/self pity Substance abuse history and/or treatment for  substance abuse?: No Suicide prevention information given to non-admitted patients: Not applicable  Risk to Others within the past 6 months Homicidal Ideation: No Does patient have any lifetime risk of violence toward others beyond the six months prior to admission? : No Thoughts of Harm to Others: No Current Homicidal Intent: No Current Homicidal Plan: No Access to Homicidal Means: No Identified Victim: none History of harm to others?: No Assessment of Violence: None Noted Violent Behavior Description: none Does patient have access to weapons?: No Criminal Charges Pending?: No Does patient have a court date: No Is patient on probation?: No  Psychosis Hallucinations: Auditory Delusions: None noted  Mental Status Report Appearance/Hygiene: Unremarkable Eye Contact: Fair Motor Activity: Freedom of movement Speech: Slow Level of Consciousness: Quiet/awake Mood: Depressed Affect: Blunted, Depressed Anxiety Level: None Thought Processes: Coherent Judgement: Impaired Orientation: Person, Place, Time, Situation Obsessive Compulsive Thoughts/Behaviors: None  Cognitive Functioning Concentration: Decreased Memory: Recent Intact, Remote Intact IQ: Average Insight: Fair Impulse Control: Fair Appetite: Fair Sleep: Decreased Total Hours of Sleep:  (not sleeping- unsure how many hours a night) Vegetative Symptoms: None  ADLScreening [facility] Assessment Services) Patient's cognitive ability adequate to safely complete daily activities?: Yes Patient able to express need for assistance with ADLs?: Yes Independently performs ADLs?: Yes (appropriate for developmental age)  Prior Inpatient Therapy Prior Inpatient Therapy: Yes Prior Therapy Dates:  (Pt unsure) Prior Therapy Facilty/Provider(s):  (pt unsure) Reason for Treatment: Depression, SI  Prior Outpatient Therapy Prior Outpatient Therapy: Yes Prior Therapy Dates: ongoing Prior Therapy Facilty/Provider(s): pt does not  remember name of provider Reason for Treatment: Depression Does patient have an ACCT team?: No Does patient have Intensive In-House Services?  : No Does patient have [facility] services? : No Does patient have P4CC services?: No  ADL Screening (condition at time of admission) Patient's cognitive ability adequate to safely complete daily activities?: Yes Is the patient deaf or have difficulty hearing?: No Does the patient have difficulty seeing, even when wearing glasses/contacts?: No Does the patient have difficulty concentrating, remembering, or making decisions?: No Patient able to express need for assistance with ADLs?: Yes Does the patient have difficulty dressing or bathing?: No Independently performs ADLs?: Yes (appropriate for developmental age) Does the patient have difficulty walking or climbing stairs?: No Weakness of Legs: None Weakness of Arms/Hands: None  Home Assistive Devices/Equipment Home Assistive Devices/Equipment: None  Therapy Consults (therapy consults require a physician order) PT Evaluation Needed: No OT Evalulation Needed: No SLP Evaluation Needed: No Abuse/Neglect Assessment (Assessment to be complete while patient is alone) Physical Abuse: Denies Verbal Abuse: Denies Sexual Abuse: Denies Exploitation of patient/patient's resources: Denies Self-Neglect: Denies Values / Beliefs Cultural Requests During Hospitalization: None Spiritual Requests During Hospitalization: None Consults Spiritual Care Consult Needed: No Social Work Consult Needed: No Merchant navy officer (For Healthcare) Does patient have an advance directive?: No Would patient like information on creating an advanced directive?: No - patient declined information    Additional Information 1:1 In Past 12 Months?: No CIRT Risk: No Elopement Risk: No Does patient have medical clearance?: Yes     Disposition:  Disposition Initial Assessment Completed for this Encounter: Yes Disposition  of Patient: Inpatient treatment program Type of inpatient treatment program: Adult  Tony Cunningham 02/07/2016 3:36 PM

## 2016-02-07 NOTE — Progress Notes (Signed)
Tony Cunningham is a 40 year old male being admitted voluntarily to [facility] from [facility] ED.  He came to the ED with thoughts of suicide for two days and thought about taking a handful of pills but decided against it.  He doesn't feel safe.  He admitted that he has been hearing voices but cannot understand what they are telling him.  He reported current stressor as not getting along with his mother.  He has history of diabetes, HTN, arthritis, high cholesterol and new onset neck pain in which they prescribed prednisone.  He continues to c/o neck pain 10/10.  He appears to be in no physical distress.  He continues to voice suicidal thoughts with no plan and is able to contract for safety on the unit.  He admits to hearing voices but reports they are just mumbling voices.  He denies HI or visual hallucinations.  He reported being very anxious, trouble sleeping, feeling hopeless, helpless and worthless.  He reported that he would like to be placed in a group home when he is discharged.  Encouraged him to talk with the doctor and social worker about his wishes.  Oriented him to the unit.  Admission paperwork completed and signed.  Belongings searched and secured in locker # 37.  Skin assessment completed and noted scar on chin from dog attack when he was younger.  Q 15 minute checks initiated for safety.  We will monitor the progress towards his goals.

## 2016-02-07 NOTE — Progress Notes (Signed)
Patient ID: Tony Cunningham, male   DOB: 03/22/1976, 40 y.o.   MRN: [id number] Per State regulations 482.30 this chart was reviewed for medical necessity with respect to the patient's admission/duration of stay.    Next review date: 02/11/16  Thurman Coyer, BSN, RN-BC  Case Manager

## 2016-02-07 NOTE — Progress Notes (Signed)
Psychoeducational Group Note  Date:  02/07/2016 Time:  2324  Group Topic/Focus:  Wrap-Up Group:   The focus of this group is to help patients review their daily goal of treatment and discuss progress on daily workbooks.   Participation Level: Did Not Attend  Participation Quality:  Not Applicable  Affect:  Not Applicable  Cognitive:  Not Applicable  Insight:  Not Applicable  Engagement in Group: Not Applicable  Additional Comments:  The patient did not attend group since he wasn't admitted until later in the evening.    Hazle Coca S 02/07/2016, 11:24 PM

## 2016-02-07 NOTE — Tx Team (Signed)
Initial Treatment Plan 02/07/2016 9:21 PM Tony Cunningham [id number]:[id number]    PATIENT STRESSORS: Financial difficulties Health problems Marital or family conflict   PATIENT STRENGTHS: Wellsite geologist fund of knowledge   PATIENT IDENTIFIED PROBLEMS: Depression  Anxiety  Suicidal ideation  Psychosis  "I want to get better and not have anxiety"  "I want my neck better"           DISCHARGE CRITERIA:  Improved stabilization in mood, thinking, and/or behavior Verbal commitment to aftercare and medication compliance  PRELIMINARY DISCHARGE PLAN: Outpatient therapy Medication management  PATIENT/FAMILY INVOLVEMENT: This treatment plan has been presented to and reviewed with the patient, Tony Cunningham.  The patient and family have been given the opportunity to ask questions and make suggestions.  Levin Bacon, RN 02/07/2016, 9:21 PM

## 2016-02-08 LAB — GLUCOSE, CAPILLARY
GLUCOSE-CAPILLARY: 327 mg/dL — AB (ref 65–99)
GLUCOSE-CAPILLARY: 419 mg/dL — AB (ref 65–99)
GLUCOSE-CAPILLARY: 420 mg/dL — AB (ref 65–99)
Glucose-Capillary: 160 mg/dL — ABNORMAL HIGH (ref 65–99)
Glucose-Capillary: 165 mg/dL — ABNORMAL HIGH (ref 65–99)
Glucose-Capillary: 266 mg/dL — ABNORMAL HIGH (ref 65–99)

## 2016-02-08 LAB — TSH: TSH: 0.334 u[IU]/mL — AB (ref 0.350–4.500)

## 2016-02-08 MED ORDER — INSULIN ASPART 100 UNIT/ML ~~LOC~~ SOLN
10.0000 [IU] | Freq: Once | SUBCUTANEOUS | Status: AC
  Administered 2016-02-08: 10 [IU] via SUBCUTANEOUS

## 2016-02-08 MED ORDER — METFORMIN HCL 500 MG PO TABS
500.0000 mg | ORAL_TABLET | Freq: Two times a day (BID) | ORAL | Status: DC
  Administered 2016-02-09 – 2016-02-21 (×25): 500 mg via ORAL
  Filled 2016-02-08 (×29): qty 1

## 2016-02-08 MED ORDER — INSULIN ASPART 100 UNIT/ML ~~LOC~~ SOLN
20.0000 [IU] | SUBCUTANEOUS | Status: AC
  Administered 2016-02-08: 20 [IU] via SUBCUTANEOUS

## 2016-02-08 MED ORDER — CANAGLIFLOZIN 300 MG PO TABS
300.0000 mg | ORAL_TABLET | Freq: Every day | ORAL | Status: DC
  Administered 2016-02-09 – 2016-02-21 (×13): 300 mg via ORAL
  Filled 2016-02-08 (×16): qty 1

## 2016-02-08 NOTE — H&P (Signed)
Psychiatric Admission Assessment Adult  Patient Identification: Tony Cunningham MRN:  [id number] Date of Evaluation:  02/08/2016 Chief Complaint:  MDD WITH PSYCHOTIC FEATURES Principal Diagnosis: MDD (major depressive disorder), recurrent, severe, with psychosis (HCC) Diagnosis:   Patient Active Problem List   Diagnosis Date Noted  . MDD (major depressive disorder), recurrent, severe, with psychosis (HCC) [F33.3] 02/07/2016    Priority: High  . Sinus complaint [R09.89] 12/25/2010  . Diabetes mellitus [250] 12/25/2010  . Depression [F32.9] 12/25/2010  . Anxiety [F41.9] 12/25/2010  . High blood pressure [I10] 12/25/2010   History of Present Illness: Tony Cunningham is an 40 y.o. male who presents to hospital with complaints of suicidal ideations with plan to overdose on "a handful of medication". These thoughts started 1.5 days ago. He states that he has a history of depression and it has been increasing lately. He has a history of inpatient admissions but could not provide dates. He states that he has been hearing voices as well that "sound like mumbling". Pt was flat and depressed with slow and soft speech. He states that he is having a hard time concentrating and it is difficult for him to talk right now. He states that he currently lives with his mom and they have a lot of conflict. He did not elaborate on what the conflict was. Pt states he is unemployed right now. He reports not being able to sleep and fair appetite. He states that he has an outpatient medication management provider and is taking his medications as prescribed. Pt denies HI or history of violence or aggression.    Associated Signs/Symptoms: Depression Symptoms:  depressed mood, (Hypo) Manic Symptoms:  Irritable Mood, Anxiety Symptoms:  Excessive Worry, Psychotic Symptoms:  na PTSD Symptoms: NA Total Time spent with patient: 45 minutes  Past Psychiatric History:  See HPi  Is the patient at risk to self? Yes.    Has the  patient been a risk to self in the past 6 months? Yes.    Has the patient been a risk to self within the distant past? Yes.    Is the patient a risk to others? No.  Has the patient been a risk to others in the past 6 months? No.  Has the patient been a risk to others within the distant past? No.   Prior Inpatient Therapy: Prior Inpatient Therapy: Yes Prior Therapy Dates:  (Pt unsure) Prior Therapy Facilty/Provider(s):  (pt unsure) Reason for Treatment: Depression, SI Prior Outpatient Therapy: Prior Outpatient Therapy: Yes Prior Therapy Dates: ongoing Prior Therapy Facilty/Provider(s): pt does not remember name of provider Reason for Treatment: Depression Does patient have an ACCT team?: No Does patient have Intensive In-House Services?  : No Does patient have [facility] services? : No Does patient have P4CC services?: No  Alcohol Screening: 1. How often do you have a drink containing alcohol?: Never 9. Have you or someone else been injured as a result of your drinking?: No 10. Has a relative or friend or a doctor or another health worker been concerned about your drinking or suggested you cut down?: No Alcohol Use Disorder Identification Test Final Score (AUDIT): 0 Brief Intervention: AUDIT score less than 7 or less-screening does not suggest unhealthy drinking-brief intervention not indicated Substance Abuse History in the last 12 months:  Yes.   Consequences of Substance Abuse: NA Previous Psychotropic Medications: Yes  Psychological Evaluations: Yes  Past Medical History:  Past Medical History:  Diagnosis Date  . Anxiety   . Asthma   .  Diabetes mellitus   . High blood pressure   . Sinus complaint    History reviewed. No pertinent surgical history. Family History: History reviewed. No pertinent family history. Family Psychiatric  History: see HPI Tobacco Screening: Have you used any form of tobacco in the last 30 days? (Cigarettes, Smokeless Tobacco, Cigars, and/or Pipes):  Yes Tobacco use, Select all that apply: 5 or more cigarettes per day Are you interested in Tobacco Cessation Medications?: Yes, will notify MD for an order Counseled patient on smoking cessation including recognizing danger situations, developing coping skills and basic information about quitting provided: Refused/Declined practical counseling Social History:  History  Alcohol Use No     History  Drug Use No    Additional Social History: Marital status: Single    Pain Medications: Ibuprofen Prescriptions: see MAR Over the Counter: see MAR History of alcohol / drug use?: No history of alcohol / drug abuse                    Allergies:   Allergies  Allergen Reactions  . Seroquel [Quetiapine Fumerate]    Lab Results:  Results for orders placed or performed during the hospital encounter of 02/07/16 (from the past 48 hour(s))  Glucose, capillary     Status: Abnormal   Collection Time: 02/07/16  9:12 PM  Result Value Ref Range   Glucose-Capillary 473 (H) 65 - 99 mg/dL   Comment 1 Notify RN   Glucose, capillary     Status: Abnormal   Collection Time: 02/07/16 10:45 PM  Result Value Ref Range   Glucose-Capillary 524 (HH) 65 - 99 mg/dL  Glucose, capillary     Status: Abnormal   Collection Time: 02/08/16 12:06 AM  Result Value Ref Range   Glucose-Capillary 327 (H) 65 - 99 mg/dL  Glucose, capillary     Status: Abnormal   Collection Time: 02/08/16  5:49 AM  Result Value Ref Range   Glucose-Capillary 160 (H) 65 - 99 mg/dL   Comment 1 Notify RN   Glucose, capillary     Status: Abnormal   Collection Time: 02/08/16 11:39 AM  Result Value Ref Range   Glucose-Capillary 165 (H) 65 - 99 mg/dL   Comment 1 Notify RN    Comment 2 Document in Chart     Blood Alcohol level:  No results found for: [facility]  Metabolic Disorder Labs:  No results found for: HGBA1C, MPG No results found for: PROLACTIN No results found for: CHOL, TRIG, HDL, CHOLHDL, VLDL, LDLCALC  Current  Medications: Current Facility-Administered Medications  Medication Dose Route Frequency Provider Last Rate Last Dose  . acetaminophen (TYLENOL) tablet 650 mg  650 mg Oral Q6H PRN Truman Hayward, FNP   650 mg at 02/08/16 1204  . alum & mag hydroxide-simeth (MAALOX/MYLANTA) 200-200-20 MG/5ML suspension 30 mL  30 mL Oral Q4H PRN Truman Hayward, FNP      . lisinopril (PRINIVIL,ZESTRIL) tablet 20 mg  20 mg Oral Daily Saramma Eappen, MD   20 mg at 02/08/16 0830   And  . hydrochlorothiazide (HYDRODIURIL) tablet 25 mg  25 mg Oral Daily Saramma Eappen, MD   25 mg at 02/08/16 0830  . hydrOXYzine (ATARAX/VISTARIL) tablet 25 mg  25 mg Oral TID PRN Truman Hayward, FNP   25 mg at 02/08/16 3151  . Influenza vac split quadrivalent PF (FLUARIX) injection 0.5 mL  0.5 mL Intramuscular Tomorrow-1000 Saramma Eappen, MD      . insulin aspart (novoLOG) injection 0-20 Units  0-20 Units Subcutaneous TID WC Kristeen Mans, NP   4 Units at 02/08/16 1206  . insulin aspart (novoLOG) injection 0-5 Units  0-5 Units Subcutaneous QHS Kristeen Mans, NP   Stopped at 02/07/16 2256  . insulin aspart (novoLOG) injection 6 Units  6 Units Subcutaneous TID WC Kristeen Mans, NP   6 Units at 02/08/16 1206  . magnesium hydroxide (MILK OF MAGNESIA) suspension 30 mL  30 mL Oral Daily PRN Truman Hayward, FNP      . mirtazapine (REMERON) tablet 30 mg  30 mg Oral QHS Kristeen Mans, NP   30 mg at 02/07/16 2142  . montelukast (SINGULAIR) tablet 10 mg  10 mg Oral QHS Kristeen Mans, NP   10 mg at 02/07/16 2142  . predniSONE (DELTASONE) tablet 20 mg  20 mg Oral Q breakfast Kristeen Mans, NP   20 mg at 02/08/16 0830  . pregabalin (LYRICA) capsule 100 mg  100 mg Oral TID Kristeen Mans, NP   100 mg at 02/08/16 1204  . simvastatin (ZOCOR) tablet 40 mg  40 mg Oral QHS Kristeen Mans, NP   40 mg at 02/07/16 2143  . venlafaxine XR (EFFEXOR-XR) 24 hr capsule 150 mg  150 mg Oral Q breakfast Kristeen Mans, NP   150 mg at 02/08/16 0830   PTA  Medications: Prescriptions Prior to Admission  Medication Sig Dispense Refill Last Dose  . aspirin 81 MG tablet Take 81 mg by mouth daily.     Past Week at Unknown time  . cyclobenzaprine (FLEXERIL) 10 MG tablet Take 10 mg by mouth 2 (two) times daily.     Past Week at Unknown time  . desvenlafaxine (PRISTIQ) 50 MG 24 hr tablet Take 50 mg by mouth daily.     Past Week at Unknown time  . Ergocalciferol (VITAMIN D2 PO) Take 1.25 mg by mouth daily. thursdays   Past Week at Unknown time  . fenofibrate (TRICOR) 145 MG tablet Take 145 mg by mouth daily.     Past Week at Unknown time  . fluticasone (FLONASE) 50 MCG/ACT nasal spray    Past Week at Unknown time  . glipiZIDE (GLUCOTROL) 10 MG tablet Take 10 mg by mouth daily.     Past Week at Unknown time  . insulin regular (NOVOLIN R) 100 units/mL injection Inject into the skin 3 (three) times daily before meals.     Past Week at Unknown time  . Liraglutide (VICTOZA) 18 MG/3ML SOPN Inject into the skin.   Past Week at Unknown time  . lisinopril-hydrochlorothiazide (PRINZIDE,ZESTORETIC) 20-25 MG per tablet Take 1 tablet by mouth daily.     Past Week at Unknown time  . mirtazapine (REMERON) 15 MG tablet Take 15 mg by mouth at bedtime.     Past Week at Unknown time  . omeprazole (PRILOSEC) 20 MG capsule Take 20 mg by mouth daily.     Past Week at Unknown time  . pioglitazone (ACTOS) 15 MG tablet Take 15 mg by mouth daily.     Past Week at Unknown time  . potassium chloride (K-DUR,KLOR-CON) 10 MEQ tablet    Past Week at Unknown time  . pregabalin (LYRICA) 75 MG capsule Take 75 mg by mouth 2 (two) times daily.     Past Week at Unknown time  . simvastatin (ZOCOR) 40 MG tablet Take 40 mg by mouth at bedtime.     Past Week at Unknown time  Musculoskeletal: Strength & Muscle Tone: within normal limits Gait & Station: normal Patient leans: [name]  Psychiatric Specialty Exam: Physical Exam  ROS  Blood pressure 105/73, pulse 77, temperature 98.4 F (36.9 C),  resp. rate 18, height 6' (1.829 m), weight 84.8 kg (187 lb), SpO2 98 %.Body mass index is 25.36 kg/m.  General Appearance: Disheveled  Eye Contact:  Good  Speech:  Garbled  Volume:  Normal  Mood:  Depressed  Affect:  Flat  Thought Process:  Disorganized  Orientation:  Full (Time, Place, and Person)  Thought Content:  Rumination  Suicidal Thoughts:  No  Homicidal Thoughts:  No  Memory:  Immediate;   Poor Recent;   Poor Remote;   Fair  Judgement:  Fair  Insight:  Fair  Psychomotor Activity:  Normal  Concentration:  Concentration: Fair and Attention Span: Fair  Recall:  [employer] of Knowledge:  Fair  Language:  Fair  Akathisia:  Yes  Handed:  Right  AIMS (if indicated):     Assets:  Communication Skills  ADL's:  Intact  Cognition:  WNL  Sleep:  Number of Hours: 5.5    Treatment Plan Summary: Daily contact with patient to assess and evaluate symptoms and progress in treatment and Medication management  Observation Level/Precautions:  15 minute checks  Laboratory:  per ED  Psychotherapy:  group  Medications:  Effexor XR 150 mg daily, Remeron 30 mg insomnia, Vistaril 25 mg PRN anxiety  Consultations:  As needed  Discharge Concerns:  safety  Estimated LOS:  2-7 days  Other:     Physician Treatment Plan for Primary Diagnosis: MDD (major depressive disorder), recurrent, severe, with psychosis (HCC) Long Term Goal(s): Improvement in symptoms so as ready for discharge  Short Term Goals: Ability to identify changes in lifestyle to reduce recurrence of condition will improve, Ability to verbalize feelings will improve, Ability to disclose and discuss suicidal ideas, Ability to demonstrate self-control will improve, Ability to identify and develop effective coping behaviors will improve, Ability to maintain clinical measurements within normal limits will improve, Compliance with prescribed medications will improve and Ability to identify triggers associated with substance  abuse/mental health issues will improve  Physician Treatment Plan for Secondary Diagnosis: Principal Problem:   MDD (major depressive disorder), recurrent, severe, with psychosis (HCC)  Long Term Goal(s): Improvement in symptoms so as ready for discharge  Short Term Goals: Ability to identify changes in lifestyle to reduce recurrence of condition will improve, Ability to verbalize feelings will improve, Ability to disclose and discuss suicidal ideas, Ability to demonstrate self-control will improve, Ability to identify and develop effective coping behaviors will improve, Ability to maintain clinical measurements within normal limits will improve, Compliance with prescribed medications will improve and Ability to identify triggers associated with substance abuse/mental health issues will improve  I certify that inpatient services furnished can reasonably be expected to improve the patient's condition.    [facility], NP [facility] 9/16/20173:52 PM  I have had examined the patient and agreed with the findings of H&P and treatment plan. Also have done suicide assessment on this patient at that time.

## 2016-02-08 NOTE — [facility] Group Notes (Signed)
[facility] LCSW Group Therapy  02/08/2016 1:00 PM  Type of Therapy:  Group Therapy  Participation Level:  Active  Participation Quality:  Appropriate  Affect:  Appropriate  Cognitive:  Alert and Oriented  Insight:  Limited  Engagement in Therapy:  Engaged  Modes of Intervention:  Discussion  Summary of Progress/Problems: Group discussed self sabotage. Group stayed very engaged on different areas of self sabotage. Group discussed several life changes: people, environment and thinking. Group further identified the importance of planning beyond the surface of the issue and engage with understanding the depth of change in the treatment and recover process. Group encouraged to identify several parts of themselves including strengths and weaknesses in order to create an appropriate plan for progressive recovery. Patient was silent but posture and eye contact affirm patient's engagement during group.   Beverly Sessions 02/08/2016

## 2016-02-08 NOTE — Plan of Care (Signed)
Problem: Medication: Goal: Compliance with prescribed medication regimen will improve Outcome: Progressing Pt has been compliant with medications tonight.    

## 2016-02-08 NOTE — Progress Notes (Signed)
Inpatient Diabetes Program Recommendations  AACE/ADA: New Consensus Statement on Inpatient Glycemic Control (2015)  Target Ranges:  Prepandial:   less than 140 mg/dL      Peak postprandial:   less than 180 mg/dL (1-2 hours)      Critically ill patients:  140 - 180 mg/dL   Lab Results  Component Value Date   GLUCAP 419 (H) 02/08/2016    Review of Glycemic Control  Diabetes history: DM2 Outpatient Diabetes medications: Victoza 1.8 mg QD, Actos15 mg QD, Regular insulin tidwc Current orders for Inpatient glycemic control: Novolog resistant tidwc and hs + 6 units tidwc  On Prednisone 20 mg QD. Uncontrolled blood sugars.  Inpatient Diabetes Program Recommendations:    Add Lantus 18 units QHS Add glipizide 10 mg QD Encourage pt to make healthy choices in cafeteria. HgbA1C to assess glycemic control prior to admission.  Will follow. Thank you. Ailene Ards, RD, LDN, CDE Inpatient Diabetes Coordinator [phone]

## 2016-02-08 NOTE — Progress Notes (Signed)
D: Pt was in the day room upon initial approach.  Pt has irritable, anxious affect and mood.  He reports his day was "so-so."  His goal is "trying to stay awake."  Pt denies SI/HI, denies hallucinations, reports pain from headache of 8/10.  Pt has been visible in milieu at times.  Pt attended evening group.   A: Introduced self to pt.  Met with pt and offered support and encouragement.  Actively listened to pt.  Pt is slow to respond to questions asked by Probation officer.  Medications administered per order.  PRN medication administered for anxiety.  On-call provider, Serena Colonel, notified of pt's CBG of 420 mg/dL.  Novolog 10 units x1 now ordered and administered.  CBG was 266 mg/dL when rechecked.  Provider notified, no further orders given.  R: Pt is compliant with medications.  Pt verbally contracts for safety.  Will continue to monitor and assess.

## 2016-02-08 NOTE — [facility] Suicide Risk Assessment (Signed)
[facility] Admission Suicide Risk Assessment   Nursing information obtained from:  Patient Demographic factors:  Male, Low socioeconomic status Current Mental Status:  Suicidal ideation indicated by patient Loss Factors:  Decline in physical health Historical Factors:  Family history of mental illness or substance abuse, Prior suicide attempts Risk Reduction Factors:  Living with another person, especially a relative  Total Time spent with patient: 1.5 hours Principal Problem: <principal problem not specified> Diagnosis:   Patient Active Problem List   Diagnosis Date Noted  . MDD (major depressive disorder), recurrent, severe, with psychosis (HCC) [F33.3] 02/07/2016  . Sinus complaint [R09.89] 12/25/2010  . Diabetes mellitus [250] 12/25/2010  . Depression [F32.9] 12/25/2010  . Anxiety [F41.9] 12/25/2010  . High blood pressure [I10] 12/25/2010   Subjective Data: Sad, depressed, flat affect. Amotivated. Not willing to talk much.   Continued Clinical Symptoms:  Alcohol Use Disorder Identification Test Final Score (AUDIT): 0 The "Alcohol Use Disorders Identification Test", Guidelines for Use in Primary Care, Second Edition.  World Science writer [facility]). Score between 0-7:  no or low risk or alcohol related problems. Score between 8-15:  moderate risk of alcohol related problems. Score between 16-19:  high risk of alcohol related problems. Score 20 or above:  warrants further diagnostic evaluation for alcohol dependence and treatment.   CLINICAL FACTORS:   Depression:   Anhedonia Comorbid alcohol abuse/dependence Hopelessness Impulsivity Alcohol/Substance Abuse/Dependencies   Musculoskeletal: Strength & Muscle Tone: within normal limits Gait & Station: normal Patient leans: no lean  Psychiatric Specialty Exam: Physical Exam  Constitutional: He appears well-developed.  HENT:  Head: Normocephalic.    Review of Systems  Cardiovascular: Negative for chest pain.   Psychiatric/Behavioral: Positive for depression, substance abuse and suicidal ideas.    Blood pressure 105/73, pulse 77, temperature 98.4 F (36.9 C), resp. rate 18, height 6' (1.829 m), weight 84.8 kg (187 lb), SpO2 98 %.Body mass index is 25.36 kg/m.  General Appearance: Casual  Eye Contact:  Fair  Speech:  Clear and Coherent and Slow  Volume:  Decreased  Mood:  Depressed and Dysphoric  Affect:  Congruent and Constricted  Thought Process:  Linear  Orientation:  Full (Time, Place, and Person)  Thought Content:  Rumination  Suicidal Thoughts:  No. Had toughts at admission  Homicidal Thoughts:  No  Memory:  Immediate;   Fair Recent;   Fair  Judgement:  Poor  Insight:  Shallow  Psychomotor Activity:  Decreased  Concentration:  Concentration: Fair and Attention Span: Fair  Recall:  [employer] of Knowledge:  Fair  Language:  Fair  Akathisia:  No  Handed:  Right  AIMS (if indicated):     Assets:  Desire for Improvement  ADL's:  Intact  Cognition:  WNL  Sleep:  Number of Hours: 5.5      COGNITIVE FEATURES THAT CONTRIBUTE TO RISK:  Closed-mindedness    SUICIDE RISK:   Moderate:  Frequent suicidal ideation with limited intensity, and duration, some specificity in terms of plans, no associated intent, good self-control, limited dysphoria/symptomatology, some risk factors present, and identifiable protective factors, including available and accessible social support.   PLAN OF CARE: Admit for stabilization. Safety and medication management. Stabilize and attend groups .   I certify that inpatient services furnished can reasonably be expected to improve the patient's condition.  Thresa Ross, MD 02/08/2016, 10:34 AM

## 2016-02-08 NOTE — Progress Notes (Signed)
CBG  CBG @ 2245 was 0524; Pt got 15 units of Novolog (STAT); CBG came down to 0327 @ 0006. CBG @ 0549 was 0160. Pt was never symptomatic.

## 2016-02-08 NOTE — Progress Notes (Signed)
Adult Psychoeducational Group Note  Date:  02/08/2016 Time:  9:13 PM  Group Topic/Focus:  Wrap-Up Group:   The focus of this group is to help patients review their daily goal of treatment and discuss progress on daily workbooks.   Participation Level:  Active  Participation Quality:  Appropriate and Attentive  Affect:  Appropriate  Cognitive:  Appropriate  Insight: Appropriate  Engagement in Group:  Engaged  Modes of Intervention:  Discussion  Additional Comments:  Pt stated his goal for today was to stay awake. Pt stated one positive thing for today is that he was able to get off the unit and play basketball. Pt was encouraged to make his needs known to staff.  Caswell Corwin 02/08/2016, 9:13 PM

## 2016-02-08 NOTE — Progress Notes (Signed)
D: Patient in bed this AM as he was awakened several times last night due to unstable blood sugars. Spoke briefly with patient 1:1. Rates sleep as poor, appetite as poor, energy as low and concentration as poor. Patient's affect flat, sad and anxious with congruent mood. Eye contact is brief. Mild confusion noted as patient repeats questions. Endorsing AH in the form of "mumbles." No VH. Denies pain, physical problems. Requesting "something to help me relax."  A: Medicated per orders, vistaril prn given. Emotional support offered and self inventory reviewed.   R: On reassess, patient is asleep. Patient denies SI/HI and remains safe on level III obs.

## 2016-02-08 NOTE — [facility] Group Notes (Signed)
[facility] Group Notes:  (Nursing/MHT/Case Management/Adjunct)  Date:  02/08/2016  Time:  0930  Type of Therapy:  Nurse Education  Participation Level:  Did Not Attend  Participation Quality:    Affect:    Cognitive:    Insight:    Engagement in Group:    Modes of Intervention:    Summary of Progress/Problems: Patient was invited however elected to remain in bed as he was up several times during the night with unstable CBGs.  Merian Capron [facility] 02/08/2016, 1000

## 2016-02-09 DIAGNOSIS — F333 Major depressive disorder, recurrent, severe with psychotic symptoms: Principal | ICD-10-CM

## 2016-02-09 LAB — GLUCOSE, CAPILLARY
GLUCOSE-CAPILLARY: 203 mg/dL — AB (ref 65–99)
Glucose-Capillary: 229 mg/dL — ABNORMAL HIGH (ref 65–99)
Glucose-Capillary: 356 mg/dL — ABNORMAL HIGH (ref 65–99)
Glucose-Capillary: 225 mg/dL — ABNORMAL HIGH (ref 65–99)

## 2016-02-09 LAB — COMPREHENSIVE METABOLIC PANEL
ALBUMIN: 4.1 g/dL (ref 3.5–5.0)
ALT: 28 U/L (ref 17–63)
AST: 25 U/L (ref 15–41)
Alkaline Phosphatase: 43 U/L (ref 38–126)
Anion gap: 10 (ref 5–15)
BUN: 36 mg/dL — AB (ref 6–20)
CALCIUM: 9.3 mg/dL (ref 8.9–10.3)
CHLORIDE: 95 mmol/L — AB (ref 101–111)
CO2: 29 mmol/L (ref 22–32)
Creatinine, Ser: 1.25 mg/dL — ABNORMAL HIGH (ref 0.61–1.24)
GFR calc Af Amer: >60 mL/min (ref >60)
GLUCOSE: 311 mg/dL — AB (ref 65–99)
POTASSIUM: 4 mmol/L (ref 3.5–5.1)
SODIUM: 134 mmol/L — AB (ref 135–145)
TOTAL PROTEIN: 6.9 g/dL (ref 6.5–8.1)
Total Bilirubin: 0.4 mg/dL (ref 0.3–1.2)

## 2016-02-09 LAB — LIPID PANEL
CHOL/HDL RATIO: 4.5 ratio
CHOLESTEROL: 197 mg/dL (ref 0–200)
HDL: 44 mg/dL (ref >40)
LDL Cholesterol: 99 mg/dL (ref 0–99)
Triglycerides: 269 mg/dL — ABNORMAL HIGH (ref <150)
VLDL: 54 mg/dL — AB (ref 0–40)

## 2016-02-09 MED ORDER — DICLOFENAC SODIUM 1 % TD GEL
2.0000 g | Freq: Two times a day (BID) | TRANSDERMAL | Status: DC
  Administered 2016-02-09 – 2016-02-21 (×17): 2 g via TOPICAL
  Filled 2016-02-09 (×2): qty 100

## 2016-02-09 MED ORDER — NICOTINE 21 MG/24HR TD PT24
21.0000 mg | MEDICATED_PATCH | Freq: Every day | TRANSDERMAL | Status: DC
Start: 2016-02-09 — End: 2016-02-21
  Administered 2016-02-09 – 2016-02-20 (×12): 21 mg via TRANSDERMAL
  Filled 2016-02-09 (×15): qty 1

## 2016-02-09 MED ORDER — INSULIN GLARGINE 100 UNIT/ML ~~LOC~~ SOLN
18.0000 [IU] | Freq: Every day | SUBCUTANEOUS | Status: DC
  Administered 2016-02-09 – 2016-02-12 (×4): 18 [IU] via SUBCUTANEOUS

## 2016-02-09 NOTE — Progress Notes (Signed)
[facility] MD Progress Note  02/09/2016 12:36 PM Tony Cunningham  MRN:  [id number] Subjective:  Patient is still c/o insomnia.   Objective:  Tony Cunningham, came in with suicidal thoughts.  Hx depression.  He states he feels ok.  He doesn't feel better nor worse.  He did report insomnia.  His speech is flat.  Forwarded little.  He denies SI HI.  No thought blocking evident.    Principal Problem: MDD (major depressive disorder), recurrent, severe, with psychosis (HCC) Diagnosis:   Patient Active Problem List   Diagnosis Date Noted  . MDD (major depressive disorder), recurrent, severe, with psychosis (HCC) [F33.3] 02/07/2016    Priority: High  . Sinus complaint [R09.89] 12/25/2010  . Diabetes mellitus [250] 12/25/2010  . Depression [F32.9] 12/25/2010  . Anxiety [F41.9] 12/25/2010  . High blood pressure [I10] 12/25/2010   Total Time spent with patient: 30 minutes  Past Psychiatric History: see HPI  Past Medical History:  Past Medical History:  Diagnosis Date  . Anxiety   . Asthma   . Diabetes mellitus   . High blood pressure   . Sinus complaint    History reviewed. No pertinent surgical history. Family History: History reviewed. No pertinent family history. Family Psychiatric  History: see HPI Social History:  History  Alcohol Use No     History  Drug Use No    Social History   Social History  . Marital status: Single    Spouse name: [name]  . Number of children: [name]  . Years of education: [name]   Social History Main Topics  . Smoking status: Current Every Day Smoker    Packs/day: 0.50    Types: Cigarettes  . Smokeless tobacco: Never Used  . Alcohol use No  . Drug use: No  . Sexual activity: Not Currently   Other Topics Concern  . None   Social History Narrative  . None   Additional Social History:    Pain Medications: Ibuprofen Prescriptions: see MAR Over the Counter: see MAR History of alcohol / drug use?: No history of alcohol / drug abuse      Sleep:  Good  Appetite:  Good  Current Medications: Current Facility-Administered Medications  Medication Dose Route Frequency Provider Last Rate Last Dose  . acetaminophen (TYLENOL) tablet 650 mg  650 mg Oral Q6H PRN Truman Hayward, FNP   650 mg at 02/09/16 0840  . alum & mag hydroxide-simeth (MAALOX/MYLANTA) 200-200-20 MG/5ML suspension 30 mL  30 mL Oral Q4H PRN Truman Hayward, FNP      . canagliflozin (INVOKANA) tablet 300 mg  300 mg Oral QAC breakfast Kristeen Mans, NP   300 mg at 02/09/16 4098  . lisinopril (PRINIVIL,ZESTRIL) tablet 20 mg  20 mg Oral Daily Jomarie Longs, MD   20 mg at 02/09/16 0839   And  . hydrochlorothiazide (HYDRODIURIL) tablet 25 mg  25 mg Oral Daily Jomarie Longs, MD   25 mg at 02/09/16 0840  . hydrOXYzine (ATARAX/VISTARIL) tablet 25 mg  25 mg Oral TID PRN Truman Hayward, FNP   25 mg at 02/08/16 2152  . Influenza vac split quadrivalent PF (FLUARIX) injection 0.5 mL  0.5 mL Intramuscular Tomorrow-1000 Saramma Eappen, MD      . insulin aspart (novoLOG) injection 0-20 Units  0-20 Units Subcutaneous TID WC Kristeen Mans, NP   7 Units at 02/09/16 1230  . insulin aspart (novoLOG) injection 0-5 Units  0-5 Units Subcutaneous QHS Kristeen Mans, NP   Stopped  at 02/07/16 2256  . insulin aspart (novoLOG) injection 6 Units  6 Units Subcutaneous TID WC Kristeen Mans, NP   6 Units at 02/09/16 1227  . magnesium hydroxide (MILK OF MAGNESIA) suspension 30 mL  30 mL Oral Daily PRN Truman Hayward, FNP      . metFORMIN (GLUCOPHAGE) tablet 500 mg  500 mg Oral BID WC Kristeen Mans, NP   500 mg at 02/09/16 0840  . mirtazapine (REMERON) tablet 30 mg  30 mg Oral QHS Kristeen Mans, NP   30 mg at 02/08/16 2152  . montelukast (SINGULAIR) tablet 10 mg  10 mg Oral QHS Kristeen Mans, NP   10 mg at 02/08/16 2152  . nicotine (NICODERM CQ - dosed in mg/24 hours) patch 21 mg  21 mg Transdermal Daily Jomarie Longs, MD   21 mg at 02/09/16 0846  . predniSONE (DELTASONE) tablet 20 mg  20 mg Oral Q breakfast  Kristeen Mans, NP   20 mg at 02/09/16 0840  . pregabalin (LYRICA) capsule 100 mg  100 mg Oral TID Kristeen Mans, NP   100 mg at 02/09/16 0840  . simvastatin (ZOCOR) tablet 40 mg  40 mg Oral QHS Kristeen Mans, NP   40 mg at 02/08/16 2152  . venlafaxine XR (EFFEXOR-XR) 24 hr capsule 150 mg  150 mg Oral Q breakfast Kristeen Mans, NP   150 mg at 02/09/16 7035    Lab Results:  Results for orders placed or performed during the hospital encounter of 02/07/16 (from the past 48 hour(s))  Glucose, capillary     Status: Abnormal   Collection Time: 02/07/16  9:12 PM  Result Value Ref Range   Glucose-Capillary 473 (H) 65 - 99 mg/dL   Comment 1 Notify RN   Glucose, capillary     Status: Abnormal   Collection Time: 02/07/16 10:45 PM  Result Value Ref Range   Glucose-Capillary 524 (HH) 65 - 99 mg/dL  Glucose, capillary     Status: Abnormal   Collection Time: 02/08/16 12:06 AM  Result Value Ref Range   Glucose-Capillary 327 (H) 65 - 99 mg/dL  Glucose, capillary     Status: Abnormal   Collection Time: 02/08/16  5:49 AM  Result Value Ref Range   Glucose-Capillary 160 (H) 65 - 99 mg/dL   Comment 1 Notify RN   Glucose, capillary     Status: Abnormal   Collection Time: 02/08/16 11:39 AM  Result Value Ref Range   Glucose-Capillary 165 (H) 65 - 99 mg/dL   Comment 1 Notify RN    Comment 2 Document in Chart   Glucose, capillary     Status: Abnormal   Collection Time: 02/08/16  5:08 PM  Result Value Ref Range   Glucose-Capillary 419 (H) 65 - 99 mg/dL   Comment 1 Notify RN    Comment 2 Document in Chart   Lipid panel     Status: Abnormal   Collection Time: 02/08/16  6:13 PM  Result Value Ref Range   Cholesterol 197 0 - 200 mg/dL   Triglycerides 009 (H) <150 mg/dL   HDL 44 >38 mg/dL   Total CHOL/HDL Ratio 4.5 RATIO   VLDL 54 (H) 0 - 40 mg/dL   LDL Cholesterol 99 0 - 99 mg/dL    Comment:        Total Cholesterol/HDL:CHD Risk Coronary Heart Disease Risk Table  Men   Women  1/2  Average Risk   3.4   3.3  Average Risk       5.0   4.4  2 X Average Risk   9.6   7.1  3 X Average Risk  23.4   11.0        Use the calculated Patient Ratio above and the CHD Risk Table to determine the patient's CHD Risk.        ATP III CLASSIFICATION (LDL):  <100     mg/dL   Optimal  081-683  mg/dL   Near or Above                    Optimal  130-159  mg/dL   Borderline  870-658  mg/dL   High  >260     mg/dL   Very High Performed at [facility]   TSH     Status: Abnormal   Collection Time: 02/08/16  6:13 PM  Result Value Ref Range   TSH 0.334 (L) 0.350 - 4.500 uIU/mL    Comment: Performed at [facility]  Glucose, capillary     Status: Abnormal   Collection Time: 02/08/16  8:37 PM  Result Value Ref Range   Glucose-Capillary 420 (H) 65 - 99 mg/dL   Comment 1 Notify RN   Glucose, capillary     Status: Abnormal   Collection Time: 02/08/16 10:41 PM  Result Value Ref Range   Glucose-Capillary 266 (H) 65 - 99 mg/dL  Glucose, capillary     Status: Abnormal   Collection Time: 02/09/16  6:15 AM  Result Value Ref Range   Glucose-Capillary 203 (H) 65 - 99 mg/dL   Comment 1 Notify RN   Glucose, capillary     Status: Abnormal   Collection Time: 02/09/16 12:01 PM  Result Value Ref Range   Glucose-Capillary 229 (H) 65 - 99 mg/dL    Blood Alcohol level:  No results found for: [facility]  Metabolic Disorder Labs: No results found for: HGBA1C, MPG No results found for: PROLACTIN Lab Results  Component Value Date   CHOL 197 02/08/2016   TRIG 269 (H) 02/08/2016   HDL 44 02/08/2016   CHOLHDL 4.5 02/08/2016   VLDL 54 (H) 02/08/2016   LDLCALC 99 02/08/2016    Physical Findings: AIMS: Facial and Oral Movements Muscles of Facial Expression: None, normal Lips and Perioral Area: None, normal Jaw: None, normal Tongue: None, normal,Extremity Movements Upper (arms, wrists, hands, fingers): None, normal Lower (legs, knees, ankles, toes): None, normal, Trunk  Movements Neck, shoulders, hips: None, normal, Overall Severity Severity of abnormal movements (highest score from questions above): None, normal Incapacitation due to abnormal movements: None, normal Patient's awareness of abnormal movements (rate only patient's report): No Awareness, Dental Status Current problems with teeth and/or dentures?: No Does patient usually wear dentures?: No  CIWA:    COWS:     Musculoskeletal: Strength & Muscle Tone: within normal limits Gait & Station: normal Patient leans: [name]  Psychiatric Specialty Exam: Physical Exam  Nursing note and vitals reviewed.   ROS  Blood pressure 108/75, pulse 66, temperature 97.6 F (36.4 C), resp. rate 16, height 6' (1.829 m), weight 84.8 kg (187 lb), SpO2 98 %.Body mass index is 25.36 kg/m.  General Appearance: Disheveled  Eye Contact:  Fair  Speech:  Clear and Coherent  Volume:  Normal  Mood:  Anxious  Affect:  Depressed and Flat  Thought Process:  Disorganized  Orientation:  Full (Time, Place, and Person)  Thought Content:  Rumination  Suicidal Thoughts:  No  Homicidal Thoughts:  No  Memory:  Immediate;   Good Recent;   Good Remote;   Good  Judgement:  Fair  Insight:  Fair  Psychomotor Activity:  Normal  Concentration:  Concentration: Fair and Attention Span: Fair  Recall:  Good  Fund of Knowledge:  Fair  Language:  Good  Akathisia:  Negative  Handed:  Right  AIMS (if indicated):     Assets:  Desire for Improvement Resilience  ADL's:  Intact  Cognition:  WNL  Sleep:  Number of Hours: 6.25   Treatment Plan Summary: Daily contact with patient to assess and evaluate symptoms and progress in treatment and Medication management  Admit for crisis management and mood stabilization. Medication management to re-stabilize current mood symptoms Group counseling sessions for coping skills Medical consults as needed Review and reinstate any pertinent home medications for other health problems   Lindwood Qua, NP [facility] 02/09/2016, 12:36 PM   Agree with NP Progress Note as above

## 2016-02-09 NOTE — [facility] Group Notes (Signed)
[facility] LCSW Group Therapy  02/09/2016 10:00 AM  Type of Therapy:  Group Therapy  Participation Level:  Did Not Attend   Beverly Sessions 02/09/2016, 1:05 PM

## 2016-02-09 NOTE — Progress Notes (Signed)
NSG 7a-7p shift:   D:  Pt. Had initially been resistant, irritable and guarded, but eventually rejoined the milieu as the day progressed.  His CBG's have continue to fluctuate due to noncompliance with diet.  He expresses frustration with vistaril because "it doesn't help; I think I need ativan".    A: Support, education, and encouragement provided as needed.  Level 3 checks continued for safety.  R: Pt. moderately receptive to intervention/s.  Safety maintained.  Joaquin Music, RN

## 2016-02-09 NOTE — [facility] Group Notes (Signed)
[facility] Group Notes:  (Nursing/MHT/Case Management/Adjunct)  Date:  02/09/2016  Time:  0900   Type of Therapy:  Nurse Education  Participation Level:  Did Not Attend  Summary of Progress/Problems: Pt didn't attend. He was invited.  Maurine Simmering 02/09/2016, 10:43 AM
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
## 2016-02-09 NOTE — Progress Notes (Signed)
Adult Psychoeducational Group Note  Date:  02/09/2016 Time:  8:54 PM  Group Topic/Focus:  Wrap-Up Group:   The focus of this group is to help patients review their daily goal of treatment and discuss progress on daily workbooks.   Participation Level:  Active  Participation Quality:  Appropriate and Attentive  Affect:  Appropriate  Cognitive:  Appropriate  Insight: Appropriate and Good  Engagement in Group:  Engaged  Modes of Intervention:  Discussion  Additional Comments:  Pt stated he had a good day today, his morning started out rough, but it got better as the day progressed. Pt was glad he got to go outside and it helped him open up. Pt was encouraged to make his needs known to staff.   Caswell Corwin 02/09/2016, 8:54 PM

## 2016-02-10 LAB — GLUCOSE, CAPILLARY
GLUCOSE-CAPILLARY: 223 mg/dL — AB (ref 65–99)
GLUCOSE-CAPILLARY: 213 mg/dL — AB (ref 65–99)
GLUCOSE-CAPILLARY: 147 mg/dL — AB (ref 65–99)
Glucose-Capillary: 436 mg/dL — ABNORMAL HIGH (ref 65–99)
Glucose-Capillary: 194 mg/dL — ABNORMAL HIGH (ref 65–99)

## 2016-02-10 LAB — HEMOGLOBIN A1C
HEMOGLOBIN A1C: 10 % — AB (ref 4.8–5.6)
MEAN PLASMA GLUCOSE: 240 mg/dL

## 2016-02-10 LAB — PROLACTIN: Prolactin: 3 ng/mL — ABNORMAL LOW (ref 4.0–15.2)

## 2016-02-10 MED ORDER — ARIPIPRAZOLE 10 MG PO TABS
10.0000 mg | ORAL_TABLET | Freq: Every day | ORAL | Status: DC
  Administered 2016-02-11 – 2016-02-21 (×11): 10 mg via ORAL
  Filled 2016-02-10 (×12): qty 1

## 2016-02-10 MED ORDER — ARIPIPRAZOLE 5 MG PO TABS
5.0000 mg | ORAL_TABLET | Freq: Every day | ORAL | Status: DC
  Administered 2016-02-10: 5 mg via ORAL
  Filled 2016-02-10 (×4): qty 1

## 2016-02-10 MED ORDER — TRAZODONE HCL 150 MG PO TABS
75.0000 mg | ORAL_TABLET | Freq: Every evening | ORAL | Status: DC | PRN
  Administered 2016-02-10: 75 mg via ORAL
  Filled 2016-02-10: qty 1

## 2016-02-10 MED ORDER — TRAZODONE HCL 50 MG PO TABS: 50.0000 mg | ORAL_TABLET | Freq: Every evening | ORAL | Status: DC | PRN

## 2016-02-10 NOTE — Progress Notes (Signed)
Inpatient Diabetes Program Recommendations  AACE/ADA: New Consensus Statement on Inpatient Glycemic Control (2015)  Target Ranges:  Prepandial:   less than 140 mg/dL      Peak postprandial:   less than 180 mg/dL (1-2 hours)      Critically ill patients:  140 - 180 mg/dL   Lab Results  Component Value Date   GLUCAP 213 (H) 02/10/2016   HGBA1C 10.0 (H) 02/09/2016    Review of Glycemic Control  FBS still elevated after addition of Lantus 18 units QHS. Post-prandial blood sugars continue to be elevated. Needs insulin adjustment. HgbA1C of 10.0% indicates poor glycemic control at home.  Inpatient Diabetes Program Recommendations:    Increase Lantus to 24 units QHS Increase Novolog to 8 units tidwc for meal coverage insulin.  Pt will need to f/u with PCP for diabetes management after d/c.  Will continue to follow. Thank you. Ailene Ards, RD, LDN, CDE Inpatient Diabetes Coordinator [phone]

## 2016-02-10 NOTE — Plan of Care (Signed)
Problem: Self-Concept: Goal: Level of anxiety will decrease Outcome: Not Progressing Continues to express feelings of anxiety.

## 2016-02-10 NOTE — Progress Notes (Signed)
Patient ID: Tony Cunningham, male   DOB: 01-Feb-1976, 40 y.o.   MRN: [id number] Patient's CBG rechecked.  CBG is 194.

## 2016-02-10 NOTE — Progress Notes (Signed)
Patient ID: Tony Cunningham, male   DOB: [birth year], 40 y.o.   MRN: [id number] Patient's CBG is 436.  Informed MD and NP of same.  NP advised give 20 units novolog plus 6 units of meal coverage.  Patient states he ate chocolate cake for lunch.  Will recheck CBG around 630 per NP.

## 2016-02-10 NOTE — Progress Notes (Signed)
Recreation Therapy Notes  Date: 02/10/16 Time: 0930 Location: 300 Hall Group Room  Group Topic: Stress Management  Goal Area(s) Addresses:  Patient will verbalize importance of using healthy stress management.  Patient will identify positive emotions associated with healthy stress management.   Intervention: Stress Management  Activity :  Progressive Muscle Relaxation.  LRT introduced the technique of progressive muscle relaxation.  LRT read a script so patients could participate in the activity.  Patients were to follow along as LRT read script.  Education:  Stress Management, Discharge Planning.   Education Outcome: Needs additional education  Clinical Observations/Feedback: Pt did not attend group.    [name], LRT/CTRS         [name], Stephone Gum A 02/10/2016 1:01 PM

## 2016-02-10 NOTE — Progress Notes (Signed)
D: Pt was in the day room upon initial approach.  Pt has anxious affect and mood.  He reports he feels "better, doing better."  His goal is to "try my best."  Pt reports the best part of his day was going outside "walking, talking, socializing."  Pt denies SI/HI, denies hallucinations, reports chronic neck pain of 6/10.  Pt has been visible in milieu interacting with peers and staff appropriately.  Pt attended evening group.   A: Actively listened to pt and offered support and encouragement.  Medication education provided.  Medications administered per order.  PRN medication administered for anxiety and pain. R: Pt is compliant with medications.  Pt verbally contracts for safety.  Will continue to monitor and assess.

## 2016-02-10 NOTE — Progress Notes (Signed)
Adult Psychoeducational Group Note  Date:  02/10/2016 Time:  11:25 AM  Group Topic/Focus:  Goals Group:   The focus of this group is to help patients establish daily goals to achieve during treatment and discuss how the patient can incorporate goal setting into their daily lives to aide in recovery.   Participation Level:  Active  Participation Quality:  Appropriate  Affect:  Appropriate  Cognitive:  Alert and Appropriate  Insight: Appropriate and Good  Engagement in Group:  Engaged  Modes of Intervention:  Discussion  Additional Comments:  Pt did participate in group this morning.  Pt states he has not had much sleep in the last few days and would like to talk to doctor about getting him on a new medication. Krislynn Gronau R Toshiko Kemler 02/10/2016, 11:25 AM

## 2016-02-10 NOTE — Progress Notes (Addendum)
[facility] MD Progress Note  02/10/2016 6:35 PM Tony Cunningham  MRN:  [id number] Subjective:  Patient reports partial improvement compared to admission but overall still feels depressed, sad. At this time denies active suicidal ideations and is able to contract for safety on the unit . Sleep, which he states has been poor, has improved partially, but is still " not good ". Hallucinations have decreased partially but have not resolved- describes as a mumbling that he cannot make out . Denies medication side effects.  Objective:  I have discussed case with treatment team and have met with patient. Patient presents with depressed mood and a constricted, vaguely irritable affect, but states he is feeling better than on admission and does smile appropriately during session . As noted, reports ongoing auditory hallucinations, does not appear internally preoccupied at this time and no delusions are noted . Denies medication side effects . No disruptive or agitated behaviors on unit, going to some groups . Limited socialization with peers    Principal Problem: MDD (major depressive disorder), recurrent, severe, with psychosis ([place]) Diagnosis:   Patient Active Problem List   Diagnosis Date Noted  . MDD (major depressive disorder), recurrent, severe, with psychosis ([place]) [F33.3] 02/07/2016  . Sinus complaint [R09.89] 12/25/2010  . Diabetes mellitus [250] 12/25/2010  . Depression [F32.9] 12/25/2010  . Anxiety [F41.9] 12/25/2010  . High blood pressure [I10] 12/25/2010   Total Time spent with patient: 20 minutes   Past Psychiatric History: see HPI  Past Medical History:  Past Medical History:  Diagnosis Date  . Anxiety   . Asthma   . Diabetes mellitus   . High blood pressure   . Sinus complaint    History reviewed. No pertinent surgical history. Family History: History reviewed. No pertinent family history. Family Psychiatric  History: see HPI Social History:  History  Alcohol Use No      History  Drug Use No    Social History   Social History  . Marital status: Single    Spouse name: [name]  . Number of children: [name]  . Years of education: [name]   Social History Main Topics  . Smoking status: Current Every Day Smoker    Packs/day: 0.50    Types: Cigarettes  . Smokeless tobacco: Never Used  . Alcohol use No  . Drug use: No  . Sexual activity: Not Currently   Other Topics Concern  . None   Social History Narrative  . None   Additional Social History:    Pain Medications: Ibuprofen Prescriptions: see MAR Over the Counter: see MAR History of alcohol / drug use?: No history of alcohol / drug abuse      Sleep:  Fair   Appetite:  Good  Current Medications: Current Facility-Administered Medications  Medication Dose Route Frequency Provider Last Rate Last Dose  . acetaminophen (TYLENOL) tablet 650 mg  650 mg Oral Q6H PRN Nanci Pina, FNP   650 mg at 02/09/16 1956  . alum & mag hydroxide-simeth (MAALOX/MYLANTA) 200-200-20 MG/5ML suspension 30 mL  30 mL Oral Q4H PRN Nanci Pina, FNP      . ARIPiprazole (ABILIFY) tablet 5 mg  5 mg Oral Daily Jenne Campus, MD   5 mg at 02/10/16 1448  . canagliflozin (INVOKANA) tablet 300 mg  300 mg Oral QAC breakfast Lurena Nida, NP   300 mg at 02/10/16 4401  . diclofenac sodium (VOLTAREN) 1 % transdermal gel 2 g  2 g Topical BID Kerrie Buffalo, NP  2 g at 02/10/16 1658  . lisinopril (PRINIVIL,ZESTRIL) tablet 20 mg  20 mg Oral Daily Ursula Alert, MD   Stopped at 02/10/16 0817   And  . hydrochlorothiazide (HYDRODIURIL) tablet 25 mg  25 mg Oral Daily Ursula Alert, MD   Stopped at 02/10/16 0817  . hydrOXYzine (ATARAX/VISTARIL) tablet 25 mg  25 mg Oral TID PRN Nanci Pina, FNP   25 mg at 02/10/16 1204  . Influenza vac split quadrivalent PF (FLUARIX) injection 0.5 mL  0.5 mL Intramuscular Tomorrow-1000 Saramma Eappen, MD      . insulin aspart (novoLOG) injection 0-20 Units  0-20 Units Subcutaneous TID WC Lurena Nida, NP   20 Units at 02/10/16 1655  . insulin aspart (novoLOG) injection 0-5 Units  0-5 Units Subcutaneous QHS Lurena Nida, NP   2 Units at 02/09/16 2140  . insulin aspart (novoLOG) injection 6 Units  6 Units Subcutaneous TID WC Lurena Nida, NP   6 Units at 02/10/16 1656  . insulin glargine (LANTUS) injection 18 Units  18 Units Subcutaneous QHS Lurena Nida, NP   18 Units at 02/09/16 2138  . magnesium hydroxide (MILK OF MAGNESIA) suspension 30 mL  30 mL Oral Daily PRN Nanci Pina, FNP      . metFORMIN (GLUCOPHAGE) tablet 500 mg  500 mg Oral BID WC Lurena Nida, NP   500 mg at 02/10/16 1656  . montelukast (SINGULAIR) tablet 10 mg  10 mg Oral QHS Lurena Nida, NP   10 mg at 02/09/16 2147  . nicotine (NICODERM CQ - dosed in mg/24 hours) patch 21 mg  21 mg Transdermal Daily Ursula Alert, MD   21 mg at 02/10/16 0817  . pregabalin (LYRICA) capsule 100 mg  100 mg Oral TID Lurena Nida, NP   100 mg at 02/10/16 1656  . simvastatin (ZOCOR) tablet 40 mg  40 mg Oral QHS Lurena Nida, NP   40 mg at 02/09/16 2147  . traZODone (DESYREL) tablet 50 mg  50 mg Oral QHS PRN Jenne Campus, MD      . venlafaxine XR (EFFEXOR-XR) 24 hr capsule 150 mg  150 mg Oral Q breakfast Lurena Nida, NP   150 mg at 02/10/16 0813    Lab Results:  Results for orders placed or performed during the hospital encounter of 02/07/16 (from the past 48 hour(s))  Glucose, capillary     Status: Abnormal   Collection Time: 02/08/16  8:37 PM  Result Value Ref Range   Glucose-Capillary 420 (H) 65 - 99 mg/dL   Comment 1 Notify RN   Glucose, capillary     Status: Abnormal   Collection Time: 02/08/16 10:41 PM  Result Value Ref Range   Glucose-Capillary 266 (H) 65 - 99 mg/dL  Glucose, capillary     Status: Abnormal   Collection Time: 02/09/16  6:15 AM  Result Value Ref Range   Glucose-Capillary 203 (H) 65 - 99 mg/dL   Comment 1 Notify RN   Hemoglobin A1c     Status: Abnormal   Collection Time: 02/09/16  6:25 AM   Result Value Ref Range   Hgb A1c MFr Bld 10.0 (H) 4.8 - 5.6 %    Comment: (NOTE)         Pre-diabetes: 5.7 - 6.4         Diabetes: >6.4         Glycemic control for adults with diabetes: <7.0    Mean Plasma  Glucose 240 mg/dL    Comment: (NOTE) Performed At: [facility] [place], [place] [id number] Lindon Romp MD [id number] Performed at [facility]   Glucose, capillary     Status: Abnormal   Collection Time: 02/09/16 12:01 PM  Result Value Ref Range   Glucose-Capillary 229 (H) 65 - 99 mg/dL  Glucose, capillary     Status: Abnormal   Collection Time: 02/09/16  5:05 PM  Result Value Ref Range   Glucose-Capillary 356 (H) 65 - 99 mg/dL  Comprehensive metabolic panel     Status: Abnormal   Collection Time: 02/09/16  6:14 PM  Result Value Ref Range   Sodium 134 (L) 135 - 145 mmol/L   Potassium 4.0 3.5 - 5.1 mmol/L   Chloride 95 (L) 101 - 111 mmol/L   CO2 29 22 - 32 mmol/L   Glucose, Bld 311 (H) 65 - 99 mg/dL   BUN 36 (H) 6 - 20 mg/dL   Creatinine, Ser 1.25 (H) 0.61 - 1.24 mg/dL   Calcium 9.3 8.9 - 10.3 mg/dL   Total Protein 6.9 6.5 - 8.1 g/dL   Albumin 4.1 3.5 - 5.0 g/dL   AST 25 15 - 41 U/L   ALT 28 17 - 63 U/L   Alkaline Phosphatase 43 38 - 126 U/L   Total Bilirubin 0.4 0.3 - 1.2 mg/dL   GFR calc non Af Amer >60 >60 mL/min   GFR calc Af Amer >60 >60 mL/min    Comment: (NOTE) The eGFR has been calculated using the CKD EPI equation. This calculation has not been validated in all clinical situations. eGFR's persistently <60 mL/min signify possible Chronic Kidney Disease.    Anion gap 10 5 - 15    Comment: Performed at [facility]  Glucose, capillary     Status: Abnormal   Collection Time: 02/09/16  8:37 PM  Result Value Ref Range   Glucose-Capillary 225 (H) 65 - 99 mg/dL  Glucose, capillary     Status: Abnormal   Collection Time: 02/10/16  6:04 AM  Result Value Ref Range   Glucose-Capillary 223 (H)  65 - 99 mg/dL  Glucose, capillary     Status: Abnormal   Collection Time: 02/10/16 12:02 PM  Result Value Ref Range   Glucose-Capillary 213 (H) 65 - 99 mg/dL  Glucose, capillary     Status: Abnormal   Collection Time: 02/10/16  4:49 PM  Result Value Ref Range   Glucose-Capillary 436 (H) 65 - 99 mg/dL  Glucose, capillary     Status: Abnormal   Collection Time: 02/10/16  6:27 PM  Result Value Ref Range   Glucose-Capillary 194 (H) 65 - 99 mg/dL    Blood Alcohol level:  No results found for: [facility]  Metabolic Disorder Labs: Lab Results  Component Value Date   HGBA1C 10.0 (H) 02/09/2016   MPG 240 02/09/2016   Lab Results  Component Value Date   PROLACTIN 3.0 (L) 02/08/2016   Lab Results  Component Value Date   CHOL 197 02/08/2016   TRIG 269 (H) 02/08/2016   HDL 44 02/08/2016   CHOLHDL 4.5 02/08/2016   VLDL 54 (H) 02/08/2016   LDLCALC 99 02/08/2016    Physical Findings: AIMS: Facial and Oral Movements Muscles of Facial Expression: None, normal Lips and Perioral Area: None, normal Jaw: None, normal Tongue: None, normal,Extremity Movements Upper (arms, wrists, hands, fingers): None, normal Lower (legs, knees, ankles, toes): None, normal, Trunk Movements Neck, shoulders, hips: None,  normal, Overall Severity Severity of abnormal movements (highest score from questions above): None, normal Incapacitation due to abnormal movements: None, normal Patient's awareness of abnormal movements (rate only patient's report): No Awareness, Dental Status Current problems with teeth and/or dentures?: No Does patient usually wear dentures?: No  CIWA:    COWS:     Musculoskeletal: Strength & Muscle Tone: within normal limits Gait & Station: normal Patient leans: [name]  Psychiatric Specialty Exam: Physical Exam  Nursing note and vitals reviewed.   ROS denies headache, denies chest pain, no shortness of breath  Blood pressure 108/69, pulse 79, temperature 98.2 F (36.8 C), temperature  source Oral, resp. rate 18, height 6' (1.829 m), weight 187 lb (84.8 kg), SpO2 98 %.Body mass index is 25.36 kg/m.  General Appearance: Fairly Groomed  Eye Contact:  Good  Speech:  Normal Rate  Volume:  Normal  Mood:  Depressed  Affect:  Restricted  Thought Process:  Linear  Orientation:  Full (Time, Place, and Person)  Thought Content:  Describes auditory hallucinations, described as mumbling - does not appear internally preoccupied and no delusions are expressed   Suicidal Thoughts:  At this time denies any plan or intention of hurting self or of suicide and contracts for safety on unit   Homicidal Thoughts:  No  Memory:  Immediate;   Good Recent;   Good Remote;   Good  Judgement:  Fair  Insight:  Fair  Psychomotor Activity:  Normal  Concentration:  Concentration: Good and Attention Span: Good  Recall:  Good  Fund of Knowledge:  Good  Language:  Good  Akathisia:  Negative  Handed:  Right  AIMS (if indicated):     Assets:  Desire for Improvement Resilience  ADL's:  Intact  Cognition:  WNL  Sleep:  Number of Hours: 6.25    Assessment - patient has improved partially, but remains depressed, with a constricted and slightly irritable affect, and reporting some ongoing neuro-vegetative symptoms ( fair sleep, decreased energy level) , as well as some persistence of auditory hallucinations, although does appear overtly psychotic or paranoid . Denies medication side effects at this time . Treatment Plan Summary: Daily contact with patient to assess and evaluate symptoms and progress in treatment and Medication management  Encourage group and milieu participation to work on coping skills and symptom reduction  Increase Abilify to 10 mgrs QDAY to address residual mood and psychotic symptoms Continue Effexor XR 150 mgrs QAM for depression  Increase Trazodone to 75 mgrs QHS PRN for insomnia  Continue Vistaril PRNs for anxiety as needed  Continue DM management  Will order T3/T4 levels  to follow up on low TSH, and will order CBC to follow up on renal function Treatment team working on disposition options    Neita Garnet, MD  02/10/2016, 6:35 PMPatient ID: Tony Cunningham, male   DOB: 1975-11-16, 40 y.o.   MRN: [id number]

## 2016-02-10 NOTE — Tx Team (Signed)
Interdisciplinary Treatment and Diagnostic Plan Update  02/10/2016 Time of Session: 9:40 AM  Tony Cunningham MRN: [id number]  Principal Diagnosis: MDD (major depressive disorder), recurrent, severe, with psychosis ([place])  Secondary Diagnoses: Principal Problem:   MDD (major depressive disorder), recurrent, severe, with psychosis ([place])   Current Medications:  Current Facility-Administered Medications  Medication Dose Route Frequency Provider Last Rate Last Dose  . acetaminophen (TYLENOL) tablet 650 mg  650 mg Oral Q6H PRN Nanci Pina, FNP   650 mg at 02/09/16 1956  . alum & mag hydroxide-simeth (MAALOX/MYLANTA) 200-200-20 MG/5ML suspension 30 mL  30 mL Oral Q4H PRN Nanci Pina, FNP      . canagliflozin (INVOKANA) tablet 300 mg  300 mg Oral QAC breakfast Lurena Nida, NP   300 mg at 02/10/16 1601  . diclofenac sodium (VOLTAREN) 1 % transdermal gel 2 g  2 g Topical BID Kerrie Buffalo, NP   2 g at 02/10/16 0932  . lisinopril (PRINIVIL,ZESTRIL) tablet 20 mg  20 mg Oral Daily Ursula Alert, MD   Stopped at 02/10/16 0817   And  . hydrochlorothiazide (HYDRODIURIL) tablet 25 mg  25 mg Oral Daily Ursula Alert, MD   Stopped at 02/10/16 0817  . hydrOXYzine (ATARAX/VISTARIL) tablet 25 mg  25 mg Oral TID PRN Nanci Pina, FNP   25 mg at 02/09/16 2147  . Influenza vac split quadrivalent PF (FLUARIX) injection 0.5 mL  0.5 mL Intramuscular Tomorrow-1000 Saramma Eappen, MD      . insulin aspart (novoLOG) injection 0-20 Units  0-20 Units Subcutaneous TID WC Lurena Nida, NP   7 Units at 02/10/16 [phone]  . insulin aspart (novoLOG) injection 0-5 Units  0-5 Units Subcutaneous QHS Lurena Nida, NP   2 Units at 02/09/16 2140  . insulin aspart (novoLOG) injection 6 Units  6 Units Subcutaneous TID WC Lurena Nida, NP   6 Units at 02/10/16 [phone]  . insulin glargine (LANTUS) injection 18 Units  18 Units Subcutaneous QHS Lurena Nida, NP   18 Units at 02/09/16 2138  . magnesium hydroxide (MILK OF MAGNESIA)  suspension 30 mL  30 mL Oral Daily PRN Nanci Pina, FNP      . metFORMIN (GLUCOPHAGE) tablet 500 mg  500 mg Oral BID WC Lurena Nida, NP   500 mg at 02/10/16 0813  . mirtazapine (REMERON) tablet 30 mg  30 mg Oral QHS Lurena Nida, NP   30 mg at 02/09/16 2147  . montelukast (SINGULAIR) tablet 10 mg  10 mg Oral QHS Lurena Nida, NP   10 mg at 02/09/16 2147  . nicotine (NICODERM CQ - dosed in mg/24 hours) patch 21 mg  21 mg Transdermal Daily Ursula Alert, MD   21 mg at 02/10/16 0817  . pregabalin (LYRICA) capsule 100 mg  100 mg Oral TID Lurena Nida, NP   100 mg at 02/10/16 0813  . simvastatin (ZOCOR) tablet 40 mg  40 mg Oral QHS Lurena Nida, NP   40 mg at 02/09/16 2147  . venlafaxine XR (EFFEXOR-XR) 24 hr capsule 150 mg  150 mg Oral Q breakfast Lurena Nida, NP   150 mg at 02/10/16 0813    PTA Medications: Prescriptions Prior to Admission  Medication Sig Dispense Refill Last Dose  . aspirin 81 MG tablet Take 81 mg by mouth daily.     Past Week at Unknown time  . cyclobenzaprine (FLEXERIL) 10 MG tablet Take 10 mg by mouth 2 (  two) times daily.     Past Week at Unknown time  . desvenlafaxine (PRISTIQ) 50 MG 24 hr tablet Take 50 mg by mouth daily.     Past Week at Unknown time  . Ergocalciferol (VITAMIN D2 PO) Take 1.25 mg by mouth daily. thursdays   Past Week at Unknown time  . fenofibrate (TRICOR) 145 MG tablet Take 145 mg by mouth daily.     Past Week at Unknown time  . fluticasone (FLONASE) 50 MCG/ACT nasal spray    Past Week at Unknown time  . glipiZIDE (GLUCOTROL) 10 MG tablet Take 10 mg by mouth daily.     Past Week at Unknown time  . insulin regular (NOVOLIN R) 100 units/mL injection Inject into the skin 3 (three) times daily before meals.     Past Week at Unknown time  . Liraglutide (VICTOZA) 18 MG/3ML SOPN Inject into the skin.   Past Week at Unknown time  . lisinopril-hydrochlorothiazide (PRINZIDE,ZESTORETIC) 20-25 MG per tablet Take 1 tablet by mouth daily.     Past Week at  Unknown time  . mirtazapine (REMERON) 15 MG tablet Take 15 mg by mouth at bedtime.     Past Week at Unknown time  . omeprazole (PRILOSEC) 20 MG capsule Take 20 mg by mouth daily.     Past Week at Unknown time  . pioglitazone (ACTOS) 15 MG tablet Take 15 mg by mouth daily.     Past Week at Unknown time  . potassium chloride (K-DUR,KLOR-CON) 10 MEQ tablet    Past Week at Unknown time  . pregabalin (LYRICA) 75 MG capsule Take 75 mg by mouth 2 (two) times daily.     Past Week at Unknown time  . simvastatin (ZOCOR) 40 MG tablet Take 40 mg by mouth at bedtime.     Past Week at Unknown time    Treatment Modalities: Medication Management, Group therapy, Case management,  1 to 1 session with clinician, Psychoeducation, Recreational therapy.   Physician Treatment Plan for Primary Diagnosis: MDD (major depressive disorder), recurrent, severe, with psychosis ([place]) Long Term Goal(s): Improvement in symptoms so as ready for discharge  Short Term Goals: Ability to identify changes in lifestyle to reduce recurrence of condition will improve, Ability to verbalize feelings will improve, Ability to disclose and discuss suicidal ideas, Ability to demonstrate self-control will improve, Ability to identify and develop effective coping behaviors will improve, Ability to maintain clinical measurements within normal limits will improve, Compliance with prescribed medications will improve and Ability to identify triggers associated with substance abuse/mental health issues will improve  Medication Management: Evaluate patient's response, side effects, and tolerance of medication regimen.  Therapeutic Interventions: 1 to 1 sessions, Unit Group sessions and Medication administration.  Evaluation of Outcomes: Not Met  Physician Treatment Plan for Secondary Diagnosis: Principal Problem:   MDD (major depressive disorder), recurrent, severe, with psychosis ([place])   Long Term Goal(s): Improvement in symptoms so as ready  for discharge  Short Term Goals: Ability to identify changes in lifestyle to reduce recurrence of condition will improve, Ability to verbalize feelings will improve, Ability to disclose and discuss suicidal ideas, Ability to demonstrate self-control will improve, Ability to identify and develop effective coping behaviors will improve, Ability to maintain clinical measurements within normal limits will improve, Compliance with prescribed medications will improve and Ability to identify triggers associated with substance abuse/mental health issues will improve  Medication Management: Evaluate patient's response, side effects, and tolerance of medication regimen.  Therapeutic Interventions: 1 to  1 sessions, Unit Group sessions and Medication administration.  Evaluation of Outcomes: Not Met   RN Treatment Plan for Primary Diagnosis: MDD (major depressive disorder), recurrent, severe, with psychosis ([place]) Long Term Goal(s): Knowledge of disease and therapeutic regimen to maintain health will improve  Short Term Goals: Ability to verbalize feelings will improve, Ability to disclose and discuss suicidal ideas and Ability to identify and develop effective coping behaviors will improve  Medication Management: RN will administer medications as ordered by provider, will assess and evaluate patient's response and provide education to patient for prescribed medication. RN will report any adverse and/or side effects to prescribing provider.  Therapeutic Interventions: 1 on 1 counseling sessions, Psychoeducation, Medication administration, Evaluate responses to treatment, Monitor vital signs and CBGs as ordered, Perform/monitor CIWA, COWS, AIMS and Fall Risk screenings as ordered, Perform wound care treatments as ordered.  Evaluation of Outcomes: Not Met   LCSW Treatment Plan for Primary Diagnosis: MDD (major depressive disorder), recurrent, severe, with psychosis ([place]) Long Term Goal(s): Safe transition to  appropriate next level of care at discharge, Engage patient in therapeutic group addressing interpersonal concerns.  Short Term Goals: Engage patient in aftercare planning with referrals and resources, Increase ability to appropriately verbalize feelings, Identify triggers associated with mental health/substance abuse issues and Increase skills for wellness and recovery  Therapeutic Interventions: Assess for all discharge needs, 1 to 1 time with Social worker, Explore available resources and support systems, Assess for adequacy in community support network, Educate family and significant other(s) on suicide prevention, Complete Psychosocial Assessment, Interpersonal group therapy.  Evaluation of Outcomes: Met   Progress in Treatment: Attending groups: Minimally Participating in groups: Minimally Taking medication as prescribed: Yes, MD continues to assess for medication changes as needed Toleration medication: Yes, no side effects reported at this time Family/Significant other contact made: No, CSW attempting to make contact with father Patient understands diagnosis: Continuing to assess Discussing patient identified problems/goals with staff: Yes Medical problems stabilized or resolved: Yes Denies suicidal/homicidal ideation: Yes Issues/concerns per patient self-inventory: None Other: [name]  New problem(s) identified: None identified at this time.   New Short Term/Long Term Goal(s): None identified at this time.   Discharge Plan or Barriers: CSW will assess for appropriate discharge plan and relevant barriers.   Reason for Continuation of Hospitalization: Anxiety Depression Medication stabilization Suicidal ideation   Estimated Length of Stay: 3-5 days  Attendees: Patient: 02/10/2016  9:40 AM  Physician: Dr. Parke Poisson 02/10/2016  9:40 AM  Nursing: Darrol Angel, Mayra Neer, RN 02/10/2016  9:40 AM  RN Care Manager: Lars Pinks, RN 02/10/2016  9:40 AM  Social Worker: Peri Maris, LCSW; Erasmo Downer Drinkard, LCSW 02/10/2016  9:40 AM  Recreational Therapist:  02/10/2016  9:40 AM  Other: Lindell Spar, NP; Ricky Ala, NP 02/10/2016  9:40 AM  Other:  02/10/2016  9:40 AM  Other: 02/10/2016  9:40 AM    Scribe for Treatment Team: Bo Mcclintock, LCSW 02/10/2016 9:40 AM

## 2016-02-10 NOTE — Progress Notes (Signed)
Patient ID: Tony Cunningham, male   DOB: 01/01/1976, 40 y.o.   MRN: [id number] D: Patient complains of severe neck pain.  He was given valtaren cream for same.  Patient rates his depression as a 7; hopelessness and anxiety as a 6.  His sleep continues to be poor; his appetite is fair; energy level is low and concentration is good.  He denies any thoughts of self harm.  He could not name a goal today, but states, "I will try my best."  Patient is soft spoken with flat, blunted affect.  His mood is depressed.  Patient is guarded with staff and peers. A: Continue to monitor medication management and MD orders.  Safety checks continued every 15 minutes per protocol.  Offer support and encouragement as needed. R: Patient is receptive to staff; his behavior is appropriate.

## 2016-02-10 NOTE — Plan of Care (Signed)
Problem: Safety: Goal: Periods of time without injury will increase Outcome: Progressing Pt has not harmed self or others tonight.  He denies SI/HI.  He verbally contracts for safety.

## 2016-02-10 NOTE — [facility] Group Notes (Signed)
[facility] LCSW Group Therapy  02/10/2016 1:15pm  Type of Therapy:  Group Therapy vercoming Obstacles  Participation Level:  Reserved  Participation Quality:  Attentive  Affect:  Appropriate  Cognitive:  Appropriate and Oriented  Insight:  Developing/Improving and Improving  Engagement in Therapy:  Improving  Modes of Intervention:  Discussion, Exploration, Problem-solving and Support  Description of Group:   In this group patients will be encouraged to explore what they see as obstacles to their own wellness and recovery. They will be guided to discuss their thoughts, feelings, and behaviors related to these obstacles. The group will process together ways to cope with barriers, with attention given to specific choices patients can make. Each patient will be challenged to identify changes they are motivated to make in order to overcome their obstacles. This group will be process-oriented, with patients participating in exploration of their own experiences as well as giving and receiving support and challenge from other group members.  Summary of Patient Progress: Pt participated minimally but was attentive throughout.   Therapeutic Modalities:   Cognitive Behavioral Therapy Solution Focused Therapy Motivational Interviewing Relapse Prevention Therapy   Chad Cordial, LCSWA 02/10/2016 4:40 PM

## 2016-02-11 LAB — BASIC METABOLIC PANEL
ANION GAP: 7 (ref 5–15)
BUN: 12 mg/dL (ref 6–20)
CALCIUM: 8.8 mg/dL — AB (ref 8.9–10.3)
CO2: 27 mmol/L (ref 22–32)
CREATININE: 0.63 mg/dL (ref 0.61–1.24)
Chloride: 102 mmol/L (ref 101–111)
GFR calc Af Amer: >60 mL/min (ref >60)
GLUCOSE: 189 mg/dL — AB (ref 65–99)
Potassium: 4.3 mmol/L (ref 3.5–5.1)
Sodium: 136 mmol/L (ref 135–145)

## 2016-02-11 LAB — GLUCOSE, CAPILLARY
GLUCOSE-CAPILLARY: 161 mg/dL — AB (ref 65–99)
GLUCOSE-CAPILLARY: 358 mg/dL — AB (ref 65–99)
Glucose-Capillary: 200 mg/dL — ABNORMAL HIGH (ref 65–99)
Glucose-Capillary: 245 mg/dL — ABNORMAL HIGH (ref 65–99)

## 2016-02-11 LAB — T4, FREE: Free T4: 0.87 ng/dL (ref 0.61–1.12)

## 2016-02-11 MED ORDER — TRAZODONE HCL 100 MG PO TABS
100.0000 mg | ORAL_TABLET | Freq: Every evening | ORAL | Status: DC | PRN
  Administered 2016-02-11 – 2016-02-20 (×9): 100 mg via ORAL
  Filled 2016-02-11 (×11): qty 1

## 2016-02-11 NOTE — [facility] Group Notes (Signed)
Pt attended spiritual care group on grief and loss facilitated by chaplain Burnis Kingfisher   Group opened with brief discussion and psycho-social ed around grief and loss in relationships and in relation to self - identifying life patterns, circumstances, changes that cause losses. Established group norm of speaking from own life experience. Group goal of establishing open and affirming space for members to share loss and experience with grief, normalize grief experience and provide psycho social education and grief support.   Tony Cunningham was present throughout group.  Alert and oriented x4 with appropriate affect.  Engaged in group, he voiced support for other group members.

## 2016-02-11 NOTE — Progress Notes (Signed)
Adult Psychoeducational Group Note  Date:  02/11/2016 Time:  9:31 PM  Group Topic/Focus:  Wrap-Up Group:   The focus of this group is to help patients review their daily goal of treatment and discuss progress on daily workbooks.   Participation Level:  Active  Participation Quality:  Appropriate  Affect:  Appropriate  Cognitive:  Appropriate  Insight: Appropriate  Engagement in Group:  Engaged  Modes of Intervention:  Discussion  Additional Comments:  Patient goal was to speak with MD and to try and get sleep. Patient has accomplished his goal. Casilda Carls 02/11/2016, 9:31 PM

## 2016-02-11 NOTE — [facility] Group Notes (Signed)
The focus of this group is to educate the patient on the purpose and policies of crisis stabilization and provide a format to answer questions about their admission.  The group details unit policies and expectations of patients while admitted. Patient attended the 0900 morning group. Patient was engaging and appropriate. 

## 2016-02-11 NOTE — Progress Notes (Signed)
Pt has been observed most of the evening in the dayroom.  He reports that his day has been ok.  He is still concerned about not getting any sleep tonight and was reminded that his sleep aid had been increased.  He contracts for safety.  He denies HI/AVH.  He makes his needs known to staff.  His CBG was 147, so he only received his Lantus.  Pt would like to go to a group home after discharge.  Support and encouragement offered.  Discharge plans are in process.  Safety maintained with q15 minute checks.

## 2016-02-11 NOTE — Progress Notes (Signed)
D: Patient denies SI/HI and A/V hallucinations; patient reports sleep is fair; reports appetite is fair; reports energy level is normal ; reports ability to concentrate is good; rates depression as 6/10; rates hopelessness 7/10; rates anxiety as 7/10;   A: Monitored q 15 minutes; patient encouraged to attend groups; patient educated about medications; patient given medications per physician orders; patient encouraged to express feelings and/or concerns  R: Patient sad and flat; patient forwards little information; patient is pleasant; patient is minimal with his peers; patient was able to set goal to talk with staff 1:1 when having feelings of SI; patient is taking medications as prescribed and tolerating medications; patient is attending all groups

## 2016-02-11 NOTE — Progress Notes (Addendum)
[facility] MD Progress Note  02/11/2016 5:01 PM Tony Cunningham  MRN:  [id number] Subjective:  Patient reports that he feels he is slowly getting better- he reports he feels less depressed, less severely anxious, and states that his hallucinations - described as mumblings- have subsided . He denies medication side effects . He does continue to complain of insomnia , states that last night slept fairly, in spite of Trazodone. He is future oriented, and states he is hoping he will be able to go to a residential setting on discharge.  Objective:  I have discussed case with treatment team and have met with patient. Patient has presented calm, cooperative on approach, no disruptive or agitated behaviors on unit. He is more visible on unit, but interactions with others are limited, and tends to forward little.  As above, reports partially improving mood , and decreased hallucinations - at this time not presenting internally preoccupied and no delusions expressed . Labs- Creatinine level normalized- 0.63       FT4 0.87 ( WNL)     Principal Problem: MDD (major depressive disorder), recurrent, severe, with psychosis (HCC) Diagnosis:   Patient Active Problem List   Diagnosis Date Noted  . MDD (major depressive disorder), recurrent, severe, with psychosis (HCC) [F33.3] 02/07/2016  . Sinus complaint [R09.89] 12/25/2010  . Diabetes mellitus [250] 12/25/2010  . Depression [F32.9] 12/25/2010  . Anxiety [F41.9] 12/25/2010  . High blood pressure [I10] 12/25/2010   Total Time spent with patient: 20 minutes   Past Psychiatric History: see HPI  Past Medical History:  Past Medical History:  Diagnosis Date  . Anxiety   . Asthma   . Diabetes mellitus   . High blood pressure   . Sinus complaint    History reviewed. No pertinent surgical history. Family History: History reviewed. No pertinent family history. Family Psychiatric  History: see HPI Social History:  History  Alcohol Use No     History  Drug Use  No    Social History   Social History  . Marital status: Single    Spouse name: [name]  . Number of children: [name]  . Years of education: [name]   Social History Main Topics  . Smoking status: Current Every Day Smoker    Packs/day: 0.50    Types: Cigarettes  . Smokeless tobacco: Never Used  . Alcohol use No  . Drug use: No  . Sexual activity: Not Currently   Other Topics Concern  . None   Social History Narrative  . None   Additional Social History:    Pain Medications: Ibuprofen Prescriptions: see MAR Over the Counter: see MAR History of alcohol / drug use?: No history of alcohol / drug abuse      Sleep:  Fair   Appetite:  Good  Current Medications: Current Facility-Administered Medications  Medication Dose Route Frequency Provider Last Rate Last Dose  . acetaminophen (TYLENOL) tablet 650 mg  650 mg Oral Q6H PRN Truman Hayward, FNP   650 mg at 02/11/16 0610  . alum & mag hydroxide-simeth (MAALOX/MYLANTA) 200-200-20 MG/5ML suspension 30 mL  30 mL Oral Q4H PRN Truman Hayward, FNP      . ARIPiprazole (ABILIFY) tablet 10 mg  10 mg Oral Daily Craige Cotta, MD   10 mg at 02/11/16 0827  . canagliflozin (INVOKANA) tablet 300 mg  300 mg Oral QAC breakfast Kristeen Mans, NP   300 mg at 02/11/16 1581  . diclofenac sodium (VOLTAREN) 1 % transdermal gel 2 g  2 g Topical BID Kerrie Buffalo, NP   2 g at 02/11/16 0827  . lisinopril (PRINIVIL,ZESTRIL) tablet 20 mg  20 mg Oral Daily Saramma Eappen, MD   20 mg at 02/11/16 0827   And  . hydrochlorothiazide (HYDRODIURIL) tablet 25 mg  25 mg Oral Daily Saramma Eappen, MD   25 mg at 02/11/16 0827  . hydrOXYzine (ATARAX/VISTARIL) tablet 25 mg  25 mg Oral TID PRN Nanci Pina, FNP   25 mg at 02/11/16 0610  . Influenza vac split quadrivalent PF (FLUARIX) injection 0.5 mL  0.5 mL Intramuscular Tomorrow-1000 Saramma Eappen, MD      . insulin aspart (novoLOG) injection 0-20 Units  0-20 Units Subcutaneous TID WC Lurena Nida, NP   4 Units at  02/11/16 1217  . insulin aspart (novoLOG) injection 0-5 Units  0-5 Units Subcutaneous QHS Lurena Nida, NP   2 Units at 02/09/16 2140  . insulin aspart (novoLOG) injection 6 Units  6 Units Subcutaneous TID WC Lurena Nida, NP   6 Units at 02/11/16 1256  . insulin glargine (LANTUS) injection 18 Units  18 Units Subcutaneous QHS Lurena Nida, NP   18 Units at 02/10/16 2216  . magnesium hydroxide (MILK OF MAGNESIA) suspension 30 mL  30 mL Oral Daily PRN Nanci Pina, FNP      . metFORMIN (GLUCOPHAGE) tablet 500 mg  500 mg Oral BID WC Lurena Nida, NP   500 mg at 02/11/16 0827  . montelukast (SINGULAIR) tablet 10 mg  10 mg Oral QHS Lurena Nida, NP   10 mg at 02/10/16 2215  . nicotine (NICODERM CQ - dosed in mg/24 hours) patch 21 mg  21 mg Transdermal Daily Ursula Alert, MD   21 mg at 02/11/16 0827  . pregabalin (LYRICA) capsule 100 mg  100 mg Oral TID Lurena Nida, NP   100 mg at 02/11/16 1211  . simvastatin (ZOCOR) tablet 40 mg  40 mg Oral QHS Lurena Nida, NP   40 mg at 02/10/16 2215  . traZODone (DESYREL) tablet 75 mg  75 mg Oral QHS PRN Jenne Campus, MD   75 mg at 02/10/16 2216  . venlafaxine XR (EFFEXOR-XR) 24 hr capsule 150 mg  150 mg Oral Q breakfast Lurena Nida, NP   150 mg at 02/11/16 0827    Lab Results:  Results for orders placed or performed during the hospital encounter of 02/07/16 (from the past 48 hour(s))  Glucose, capillary     Status: Abnormal   Collection Time: 02/09/16  5:05 PM  Result Value Ref Range   Glucose-Capillary 356 (H) 65 - 99 mg/dL  Comprehensive metabolic panel     Status: Abnormal   Collection Time: 02/09/16  6:14 PM  Result Value Ref Range   Sodium 134 (L) 135 - 145 mmol/L   Potassium 4.0 3.5 - 5.1 mmol/L   Chloride 95 (L) 101 - 111 mmol/L   CO2 29 22 - 32 mmol/L   Glucose, Bld 311 (H) 65 - 99 mg/dL   BUN 36 (H) 6 - 20 mg/dL   Creatinine, Ser 1.25 (H) 0.61 - 1.24 mg/dL   Calcium 9.3 8.9 - 10.3 mg/dL   Total Protein 6.9 6.5 - 8.1 g/dL    Albumin 4.1 3.5 - 5.0 g/dL   AST 25 15 - 41 U/L   ALT 28 17 - 63 U/L   Alkaline Phosphatase 43 38 - 126 U/L   Total Bilirubin 0.4 0.3 -  1.2 mg/dL   GFR calc non Af Amer >60 >60 mL/min   GFR calc Af Amer >60 >60 mL/min    Comment: (NOTE) The eGFR has been calculated using the CKD EPI equation. This calculation has not been validated in all clinical situations. eGFR's persistently <60 mL/min signify possible Chronic Kidney Disease.    Anion gap 10 5 - 15    Comment: Performed at [facility]  Glucose, capillary     Status: Abnormal   Collection Time: 02/09/16  8:37 PM  Result Value Ref Range   Glucose-Capillary 225 (H) 65 - 99 mg/dL  Glucose, capillary     Status: Abnormal   Collection Time: 02/10/16  6:04 AM  Result Value Ref Range   Glucose-Capillary 223 (H) 65 - 99 mg/dL  Glucose, capillary     Status: Abnormal   Collection Time: 02/10/16 12:02 PM  Result Value Ref Range   Glucose-Capillary 213 (H) 65 - 99 mg/dL  Glucose, capillary     Status: Abnormal   Collection Time: 02/10/16  4:49 PM  Result Value Ref Range   Glucose-Capillary 436 (H) 65 - 99 mg/dL  Glucose, capillary     Status: Abnormal   Collection Time: 02/10/16  6:27 PM  Result Value Ref Range   Glucose-Capillary 194 (H) 65 - 99 mg/dL  Glucose, capillary     Status: Abnormal   Collection Time: 02/10/16  9:55 PM  Result Value Ref Range   Glucose-Capillary 147 (H) 65 - 99 mg/dL   Comment 1 Notify RN   Glucose, capillary     Status: Abnormal   Collection Time: 02/11/16  5:59 AM  Result Value Ref Range   Glucose-Capillary 200 (H) 65 - 99 mg/dL   Comment 1 Notify RN   T4, free     Status: None   Collection Time: 02/11/16  6:14 AM  Result Value Ref Range   Free T4 0.87 0.61 - 1.12 ng/dL    Comment: (NOTE) Biotin ingestion may interfere with free T4 tests. If the results are inconsistent with the TSH level, previous test results, or the clinical presentation, then consider biotin interference.  If needed, order repeat testing after stopping biotin. Performed at [facility] metabolic panel     Status: Abnormal   Collection Time: 02/11/16  6:14 AM  Result Value Ref Range   Sodium 136 135 - 145 mmol/L   Potassium 4.3 3.5 - 5.1 mmol/L   Chloride 102 101 - 111 mmol/L   CO2 27 22 - 32 mmol/L   Glucose, Bld 189 (H) 65 - 99 mg/dL   BUN 12 6 - 20 mg/dL   Creatinine, Ser 0.63 0.61 - 1.24 mg/dL   Calcium 8.8 (L) 8.9 - 10.3 mg/dL   GFR calc non Af Amer >60 >60 mL/min   GFR calc Af Amer >60 >60 mL/min    Comment: (NOTE) The eGFR has been calculated using the CKD EPI equation. This calculation has not been validated in all clinical situations. eGFR's persistently <60 mL/min signify possible Chronic Kidney Disease.    Anion gap 7 5 - 15    Comment: Performed at [facility]  Glucose, capillary     Status: Abnormal   Collection Time: 02/11/16 12:03 PM  Result Value Ref Range   Glucose-Capillary 161 (H) 65 - 99 mg/dL    Blood Alcohol level:  No results found for: [facility]  Metabolic Disorder Labs: Lab Results  Component Value Date  HGBA1C 10.0 (H) 02/09/2016   MPG 240 02/09/2016   Lab Results  Component Value Date   PROLACTIN 3.0 (L) 02/08/2016   Lab Results  Component Value Date   CHOL 197 02/08/2016   TRIG 269 (H) 02/08/2016   HDL 44 02/08/2016   CHOLHDL 4.5 02/08/2016   VLDL 54 (H) 02/08/2016   LDLCALC 99 02/08/2016    Physical Findings: AIMS: Facial and Oral Movements Muscles of Facial Expression: None, normal Lips and Perioral Area: None, normal Jaw: None, normal Tongue: None, normal,Extremity Movements Upper (arms, wrists, hands, fingers): None, normal Lower (legs, knees, ankles, toes): None, normal, Trunk Movements Neck, shoulders, hips: None, normal, Overall Severity Severity of abnormal movements (highest score from questions above): None, normal Incapacitation due to abnormal movements: None, normal Patient's awareness  of abnormal movements (rate only patient's report): No Awareness, Dental Status Current problems with teeth and/or dentures?: No Does patient usually wear dentures?: No  CIWA:    COWS:     Musculoskeletal: Strength & Muscle Tone: within normal limits Gait & Station: normal Patient leans: [name]  Psychiatric Specialty Exam: Physical Exam  Nursing note and vitals reviewed.   ROS denies headache, denies chest pain, no shortness of breath  Blood pressure 105/66, pulse 65, temperature 98.5 F (36.9 C), temperature source Oral, resp. rate 16, height 6' (1.829 m), weight 187 lb (84.8 kg), SpO2 98 %.Body mass index is 25.36 kg/m.  General Appearance: Fairly Groomed  Eye Contact:  Good  Speech:  Normal Rate  Volume:  Normal  Mood:  Still depressed but has improved partially compared to admission  Affect:  Still constricted, but improving, smiles at times appropriately during session   Thought Process:  Linear  Orientation:  Full (Time, Place, and Person)  Thought Content:  Reports auditory hallucinations have decreased significantly, and does not express any delusions , does not appear internally preoccupied   Suicidal Thoughts:  At this time denies any plan or intention of hurting self or of suicide and contracts for safety on unit   Homicidal Thoughts:  No  Memory:  Immediate;   Good Recent;   Good Remote;   Good  Judgement:  Improving   Insight:   Improving   Psychomotor Activity:  Normal  Concentration:  Concentration: Good and Attention Span: Good  Recall:  Good  Fund of Knowledge:  Good  Language:  Good  Akathisia:  Negative  Handed:  Right  AIMS (if indicated):     Assets:  Desire for Improvement Resilience  ADL's:  Intact  Cognition:  WNL  Sleep:  Number of Hours: 4    Assessment - patient continues to gradually improve, and states he is less depressed, does present with slowly improving range of affect, and is more future oriented . Also reports auditory hallucinations  have subsided. Major complaint at this time is ongoing insomnia in spite of Trazodone, which he tolerated well, but which " did not help that much".  Treatment Plan Summary: Daily contact with patient to assess and evaluate symptoms and progress in treatment and Medication management  Encourage group and milieu participation to work on coping skills and symptom reduction  Continue  Abilify  10 mgrs QDAY to for  mood and psychotic symptoms Continue Effexor XR 150 mgrs QAM for depression  Increase Trazodone to 100 mgrs QHS PRN for insomnia  Continue Vistaril PRNs for anxiety as needed  Continue DM management  Treatment team working on disposition options    Neita Garnet, MD  02/11/2016,  5:01 PMPatient ID: Tony Cunningham, male   DOB: 1976/02/23, 40 y.o.   MRN: [id number]

## 2016-02-11 NOTE — [facility] Group Notes (Signed)
[facility] LCSW Group Therapy 02/11/2016 1:15 PM  Type of Therapy: Group Therapy- Feelings about Diagnosis  Pt did not attend, declined invitation.   Chad Cordial, LCSWA 02/11/2016 4:29 PM

## 2016-02-11 NOTE — [place] FL2 (Deleted)
[place] MEDICAID FL2 LEVEL OF CARE SCREENING TOOL     IDENTIFICATION  Patient Name: Tony Cunningham Birthdate: 1975-12-26 Sex: male Admission Date (Current Location): 02/07/2016  [facility] and [place] Number:  [name] [id number] T Facility and Address:   [facility], [address], [place]  [address])      Provider Number: [id number]  Attending Physician Name and Address:  Jomarie Longs, MD  Relative Name and Phone Number:       Current Level of Care: Hospital Recommended Level of Care: Assisted Living Facility, Family Care Home Prior Approval Number:    Date Approved/Denied:   PASRR Number:    Discharge Plan: Domiciliary (Rest home)    Current Diagnoses: Patient Active Problem List   Diagnosis Date Noted  . MDD (major depressive disorder), recurrent, severe, with psychosis (HCC) 02/07/2016  . Sinus complaint 12/25/2010  . Diabetes mellitus 12/25/2010  . Depression 12/25/2010  . Anxiety 12/25/2010  . High blood pressure 12/25/2010    Orientation RESPIRATION BLADDER Height & Weight     Self, Time, Situation, Place (Oriented x4)  Normal Continent Weight: 187 lb (84.8 kg) Height:  6' (182.9 cm)  BEHAVIORAL SYMPTOMS/MOOD NEUROLOGICAL BOWEL NUTRITION STATUS      Continent Diet (Regular diet)  AMBULATORY STATUS COMMUNICATION OF NEEDS Skin   Independent Verbally                         Personal Care Assistance Level of Assistance  Bathing, Feeding, Dressing, Total care Bathing Assistance: Independent Feeding assistance: Independent Dressing Assistance: Independent Total Care Assistance: Independent   Functional Limitations Info  Sight, Hearing, Speech Sight Info: Adequate Hearing Info: Adequate Speech Info: Adequate    SPECIAL CARE FACTORS FREQUENCY                       Contractures Contractures Info: Not present    Additional Factors Info  Psychotropic, Allergies   Allergies Info: Allergic to Seroquel Psychotropic  Info: For meds, see MAR         Current Medications (02/11/2016):  This is the current hospital active medication list Current Facility-Administered Medications  Medication Dose Route Frequency Provider Last Rate Last Dose  . acetaminophen (TYLENOL) tablet 650 mg  650 mg Oral Q6H PRN Truman Hayward, FNP   650 mg at 02/11/16 0610  . alum & mag hydroxide-simeth (MAALOX/MYLANTA) 200-200-20 MG/5ML suspension 30 mL  30 mL Oral Q4H PRN Truman Hayward, FNP      . ARIPiprazole (ABILIFY) tablet 10 mg  10 mg Oral Daily Craige Cotta, MD   10 mg at 02/11/16 0827  . canagliflozin (INVOKANA) tablet 300 mg  300 mg Oral QAC breakfast Kristeen Mans, NP   300 mg at 02/11/16 9892  . diclofenac sodium (VOLTAREN) 1 % transdermal gel 2 g  2 g Topical BID Adonis Brook, NP   2 g at 02/11/16 0827  . lisinopril (PRINIVIL,ZESTRIL) tablet 20 mg  20 mg Oral Daily Saramma Eappen, MD   20 mg at 02/11/16 0827   And  . hydrochlorothiazide (HYDRODIURIL) tablet 25 mg  25 mg Oral Daily Saramma Eappen, MD   25 mg at 02/11/16 0827  . hydrOXYzine (ATARAX/VISTARIL) tablet 25 mg  25 mg Oral TID PRN Truman Hayward, FNP   25 mg at 02/11/16 0610  . Influenza vac split quadrivalent PF (FLUARIX) injection 0.5 mL  0.5 mL Intramuscular Tomorrow-1000 Jomarie Longs, MD      .  insulin aspart (novoLOG) injection 0-20 Units  0-20 Units Subcutaneous TID WC Kristeen Mans, NP   4 Units at 02/11/16 0640  . insulin aspart (novoLOG) injection 0-5 Units  0-5 Units Subcutaneous QHS Kristeen Mans, NP   2 Units at 02/09/16 2140  . insulin aspart (novoLOG) injection 6 Units  6 Units Subcutaneous TID WC Kristeen Mans, NP   6 Units at 02/11/16 [phone]  . insulin glargine (LANTUS) injection 18 Units  18 Units Subcutaneous QHS Kristeen Mans, NP   18 Units at 02/10/16 2216  . magnesium hydroxide (MILK OF MAGNESIA) suspension 30 mL  30 mL Oral Daily PRN Truman Hayward, FNP      . metFORMIN (GLUCOPHAGE) tablet 500 mg  500 mg Oral BID WC Kristeen Mans, NP    500 mg at 02/11/16 0827  . montelukast (SINGULAIR) tablet 10 mg  10 mg Oral QHS Kristeen Mans, NP   10 mg at 02/10/16 2215  . nicotine (NICODERM CQ - dosed in mg/24 hours) patch 21 mg  21 mg Transdermal Daily Jomarie Longs, MD   21 mg at 02/11/16 0827  . pregabalin (LYRICA) capsule 100 mg  100 mg Oral TID Kristeen Mans, NP   100 mg at 02/11/16 0827  . simvastatin (ZOCOR) tablet 40 mg  40 mg Oral QHS Kristeen Mans, NP   40 mg at 02/10/16 2215  . traZODone (DESYREL) tablet 75 mg  75 mg Oral QHS PRN Craige Cotta, MD   75 mg at 02/10/16 2216  . venlafaxine XR (EFFEXOR-XR) 24 hr capsule 150 mg  150 mg Oral Q breakfast Kristeen Mans, NP   150 mg at 02/11/16 0827     Discharge Medications: Please see discharge summary for a list of discharge medications.  Relevant Imaging Results:  Relevant Lab Results:   Additional Information    Sallee Lange, LCSW

## 2016-02-12 LAB — GLUCOSE, CAPILLARY
Glucose-Capillary: 166 mg/dL — ABNORMAL HIGH (ref 65–99)
Glucose-Capillary: 226 mg/dL — ABNORMAL HIGH (ref 65–99)
Glucose-Capillary: 393 mg/dL — ABNORMAL HIGH (ref 65–99)
Glucose-Capillary: 233 mg/dL — ABNORMAL HIGH (ref 65–99)

## 2016-02-12 LAB — T3, FREE: T3, Free: 2.4 pg/mL (ref 2.0–4.4)

## 2016-02-12 MED ORDER — VENLAFAXINE HCL ER 150 MG PO CP24
225.0000 mg | ORAL_CAPSULE | Freq: Every day | ORAL | Status: DC
  Administered 2016-02-13 – 2016-02-17 (×5): 225 mg via ORAL
  Filled 2016-02-12 (×6): qty 1

## 2016-02-12 NOTE — Progress Notes (Signed)
Adult Psychoeducational Group Note  Date:  02/12/2016 Time:  10:31 PM  Group Topic/Focus:  Wrap-Up Group:   The focus of this group is to help patients review their daily goal of treatment and discuss progress on daily workbooks.   Participation Level:  Minimal  Participation Quality:  Appropriate  Affect:  Flat  Cognitive:  Alert  Insight: Appropriate  Engagement in Group:  Engaged  Modes of Intervention:  Discussion  Additional Comments:  Pt states that today was a good day. He is looking forward to getting his x-ray on his neck. Kaleen Odea R 02/12/2016, 10:31 PM

## 2016-02-12 NOTE — Progress Notes (Signed)
Patient ID: Tony Cunningham, male   DOB: 07-06-1975, 40 y.o.   MRN: [id number]  Pt currently presents with a flat affect and anxious behavior. Pt reports to writer that their goal is to "get some sleep." Pt asks the same question repeatedly, slow to respond. Pt reports poor sleep with current medication regimen. Pt verbalizes understanding of glucose monitoring but has concerns that his blood sugar will drop too low, pt CBG average 300. Pt seems to have paranoia about medications during administration, asks which one is which and what color multiple times, adheres to medication regiment with encouragement.  Pt provided with medications per providers orders. Pt's labs and vitals were monitored throughout the night. Pt supported emotionally and encouraged to express concerns and questions. Pt educated on medications and diabetes control medication.   Pt's safety ensured with 15 minute and environmental checks. Pt currently denies SI/HI and A/V hallucinations. Pt verbally agrees to seek staff if SI/HI or A/VH occurs and to consult with staff before acting on any harmful thoughts. Pt needs more teaching on blood sugar monitoring and sliding scale coverage. Will continue POC.

## 2016-02-12 NOTE — Progress Notes (Signed)
Pt seems a little more relaxed this evening, although he is still concerned about getting some restful sleep tonight.  He has been in the dayroom this evening, but has minimal interaction with his peers.  He denies SI/HI/AVH at this time.  Pt makes his needs known to staff.  He took his hs meds and also received the prn Trazodone 100 mg for sleep. He was told that it would take affect within the hour.  Prior to going to bed he asked if he could have coffee.  He was reminded that his chief complaint at this time was insomnia.  He stated that the coffee was decaf, but Clinical research associate reminded him that it still had some caffeine in it and was NOT caffeine free.  Pt settled for a cup of ice water at the urging of Clinical research associate.  His CBG was elevated tonight at 358.  He received 5 units of Novolog along with his Lantus.  Support and encouragement offered.  Pt is receptive to suggestions of staff. He is pleasant and cooperative.  Discharge plans are in process.  Pt wants to go to a group home instead of returning to his mother's home.  Safety maintained with q15 minute checks.

## 2016-02-12 NOTE — Progress Notes (Signed)
D: Patient denies SI/HI and A/V hallucinations; patient reports sleep is fair because he slept four hours; reports appetite is fair ; reports energy level is low ; reports ability to concentration is poor; rates depression as 5/10; rates hopelessness 8/10; rates anxiety as 7/10;   A: Monitored q 15 minutes; patient encouraged to attend groups; patient educated about medications; patient given medications per physician orders; patient encouraged to express feelings and/or concerns  R: Patient is cooperative; patient is flat and sad; patient is anxious at times; patient's interaction with staff and peers is minimal; patient was able to set goal to talk with staff 1:1 when having feelings of SI; patient is taking medications as prescribed and tolerating medications; patient is attending all groups

## 2016-02-12 NOTE — [facility] Group Notes (Signed)
[facility] LCSW Aftercare Discharge Planning Group Note  02/12/2016 8:45 AM  Pt did not attend, declined invitation.   Chad Cordial, LCSWA 02/12/2016 10:07 AM

## 2016-02-12 NOTE — Plan of Care (Signed)
Problem: Medication: Goal: Compliance with prescribed medication regimen will improve Outcome: Adequate for Discharge Pt is compliant with meds on the unit and asks appropriate questions staring new medications.

## 2016-02-12 NOTE — Progress Notes (Signed)
[facility] MD Progress Note  02/12/2016 3:45 PM Takoda Janowiak  MRN:  [id number] Subjective:  Patient reports partial improvement compared to admission - states he continues to feel depressed, but that he is better. Reports significantly missing /having ruminations about family members he has had little contact with in a long time, to include siblings who live out of state and father . States he is closest to mother,and was living with her , but that living with her on a 24/7 basis was resulting in increasing conflict . States " when I saw her once or twice a month, when I was in a group home, we had a really good relationship". Denies medication side effects.  Objective:  I have discussed case with treatment team and have met with patient. Remains calm , pleasant on approach, cooperative. Denies medication side effects. No disruptive or agitated behaviors on unit, does tend to keep to self, and although visible on unit, his interactions with peers are limited . His insomnia has improved significantly but partially. States he slept between 4-5 hours last night, which although still fair, is " much better than what I had been able to sleep before ". He remains future oriented and is hoping to be able to get into a group home setting . As noted, reports some improvement in mood , but continues to present with a somewhat constricted , sad affect , and acknowledges that he does continue to feel depressed .     Principal Problem: MDD (major depressive disorder), recurrent, severe, with psychosis ([place]) Diagnosis:   Patient Active Problem List   Diagnosis Date Noted  . MDD (major depressive disorder), recurrent, severe, with psychosis ([place]) [F33.3] 02/07/2016  . Sinus complaint [R09.89] 12/25/2010  . Diabetes mellitus [250] 12/25/2010  . Depression [F32.9] 12/25/2010  . Anxiety [F41.9] 12/25/2010  . High blood pressure [I10] 12/25/2010   Total Time spent with patient: 20 minutes   Past Psychiatric  History: see HPI  Past Medical History:  Past Medical History:  Diagnosis Date  . Anxiety   . Asthma   . Diabetes mellitus   . High blood pressure   . Sinus complaint    History reviewed. No pertinent surgical history. Family History: History reviewed. No pertinent family history. Family Psychiatric  History: see HPI Social History:  History  Alcohol Use No     History  Drug Use No    Social History   Social History  . Marital status: Single    Spouse name: [name]  . Number of children: [name]  . Years of education: [name]   Social History Main Topics  . Smoking status: Current Every Day Smoker    Packs/day: 0.50    Types: Cigarettes  . Smokeless tobacco: Never Used  . Alcohol use No  . Drug use: No  . Sexual activity: Not Currently   Other Topics Concern  . None   Social History Narrative  . None   Additional Social History:    Pain Medications: Ibuprofen Prescriptions: see MAR Over the Counter: see MAR History of alcohol / drug use?: No history of alcohol / drug abuse      Sleep:  Fair - gradually improving   Appetite:  Good  Current Medications: Current Facility-Administered Medications  Medication Dose Route Frequency Provider Last Rate Last Dose  . acetaminophen (TYLENOL) tablet 650 mg  650 mg Oral Q6H PRN Nanci Pina, FNP   650 mg at 02/12/16 1213  . alum & mag hydroxide-simeth (MAALOX/MYLANTA) 200-200-20  MG/5ML suspension 30 mL  30 mL Oral Q4H PRN Nanci Pina, FNP      . ARIPiprazole (ABILIFY) tablet 10 mg  10 mg Oral Daily Jenne Campus, MD   10 mg at 02/12/16 0809  . canagliflozin (INVOKANA) tablet 300 mg  300 mg Oral QAC breakfast Lurena Nida, NP   300 mg at 02/12/16 0631  . diclofenac sodium (VOLTAREN) 1 % transdermal gel 2 g  2 g Topical BID Kerrie Buffalo, NP   2 g at 02/12/16 0811  . lisinopril (PRINIVIL,ZESTRIL) tablet 20 mg  20 mg Oral Daily Saramma Eappen, MD   20 mg at 02/12/16 0809   And  . hydrochlorothiazide (HYDRODIURIL)  tablet 25 mg  25 mg Oral Daily Saramma Eappen, MD   25 mg at 02/12/16 0811  . hydrOXYzine (ATARAX/VISTARIL) tablet 25 mg  25 mg Oral TID PRN Nanci Pina, FNP   25 mg at 02/12/16 1212  . Influenza vac split quadrivalent PF (FLUARIX) injection 0.5 mL  0.5 mL Intramuscular Tomorrow-1000 Saramma Eappen, MD      . insulin aspart (novoLOG) injection 0-20 Units  0-20 Units Subcutaneous TID WC Lurena Nida, NP   7 Units at 02/12/16 1214  . insulin aspart (novoLOG) injection 0-5 Units  0-5 Units Subcutaneous QHS Lurena Nida, NP   5 Units at 02/11/16 2102  . insulin aspart (novoLOG) injection 6 Units  6 Units Subcutaneous TID WC Lurena Nida, NP   6 Units at 02/12/16 1215  . insulin glargine (LANTUS) injection 18 Units  18 Units Subcutaneous QHS Lurena Nida, NP   18 Units at 02/11/16 2102  . magnesium hydroxide (MILK OF MAGNESIA) suspension 30 mL  30 mL Oral Daily PRN Nanci Pina, FNP      . metFORMIN (GLUCOPHAGE) tablet 500 mg  500 mg Oral BID WC Lurena Nida, NP   500 mg at 02/12/16 0809  . montelukast (SINGULAIR) tablet 10 mg  10 mg Oral QHS Lurena Nida, NP   10 mg at 02/11/16 2101  . nicotine (NICODERM CQ - dosed in mg/24 hours) patch 21 mg  21 mg Transdermal Daily Ursula Alert, MD   21 mg at 02/12/16 0569  . pregabalin (LYRICA) capsule 100 mg  100 mg Oral TID Lurena Nida, NP   100 mg at 02/12/16 1213  . simvastatin (ZOCOR) tablet 40 mg  40 mg Oral QHS Lurena Nida, NP   40 mg at 02/11/16 2101  . traZODone (DESYREL) tablet 100 mg  100 mg Oral QHS PRN Jenne Campus, MD   100 mg at 02/11/16 2137  . venlafaxine XR (EFFEXOR-XR) 24 hr capsule 150 mg  150 mg Oral Q breakfast Lurena Nida, NP   150 mg at 02/12/16 7948    Lab Results:  Results for orders placed or performed during the hospital encounter of 02/07/16 (from the past 48 hour(s))  Glucose, capillary     Status: Abnormal   Collection Time: 02/10/16  4:49 PM  Result Value Ref Range   Glucose-Capillary 436 (H) 65 - 99 mg/dL   Glucose, capillary     Status: Abnormal   Collection Time: 02/10/16  6:27 PM  Result Value Ref Range   Glucose-Capillary 194 (H) 65 - 99 mg/dL  Glucose, capillary     Status: Abnormal   Collection Time: 02/10/16  9:55 PM  Result Value Ref Range   Glucose-Capillary 147 (H) 65 - 99 mg/dL   Comment  1 Notify RN   Glucose, capillary     Status: Abnormal   Collection Time: 02/11/16  5:59 AM  Result Value Ref Range   Glucose-Capillary 200 (H) 65 - 99 mg/dL   Comment 1 Notify RN   T4, free     Status: None   Collection Time: 02/11/16  6:14 AM  Result Value Ref Range   Free T4 0.87 0.61 - 1.12 ng/dL    Comment: (NOTE) Biotin ingestion may interfere with free T4 tests. If the results are inconsistent with the TSH level, previous test results, or the clinical presentation, then consider biotin interference. If needed, order repeat testing after stopping biotin. Performed at [facility]   T3, free     Status: None   Collection Time: 02/11/16  6:14 AM  Result Value Ref Range   T3, Free 2.4 2.0 - 4.4 pg/mL    Comment: (NOTE) Performed At: [facility] [place], [place] [id number] Lindon Romp MD [id number] Performed at [place] metabolic panel     Status: Abnormal   Collection Time: 02/11/16  6:14 AM  Result Value Ref Range   Sodium 136 135 - 145 mmol/L   Potassium 4.3 3.5 - 5.1 mmol/L   Chloride 102 101 - 111 mmol/L   CO2 27 22 - 32 mmol/L   Glucose, Bld 189 (H) 65 - 99 mg/dL   BUN 12 6 - 20 mg/dL   Creatinine, Ser 0.63 0.61 - 1.24 mg/dL   Calcium 8.8 (L) 8.9 - 10.3 mg/dL   GFR calc non Af Amer >60 >60 mL/min   GFR calc Af Amer >60 >60 mL/min    Comment: (NOTE) The eGFR has been calculated using the CKD EPI equation. This calculation has not been validated in all clinical situations. eGFR's persistently <60 mL/min signify possible Chronic Kidney Disease.    Anion gap 7 5 - 15    Comment: Performed at [facility]  Glucose, capillary     Status: Abnormal   Collection Time: 02/11/16 12:03 PM  Result Value Ref Range   Glucose-Capillary 161 (H) 65 - 99 mg/dL  Glucose, capillary     Status: Abnormal   Collection Time: 02/11/16  5:16 PM  Result Value Ref Range   Glucose-Capillary 245 (H) 65 - 99 mg/dL   Comment 1 Notify RN    Comment 2 Document in Chart   Glucose, capillary     Status: Abnormal   Collection Time: 02/11/16  7:58 PM  Result Value Ref Range   Glucose-Capillary 358 (H) 65 - 99 mg/dL  Glucose, capillary     Status: Abnormal   Collection Time: 02/12/16  5:59 AM  Result Value Ref Range   Glucose-Capillary 166 (H) 65 - 99 mg/dL  Glucose, capillary     Status: Abnormal   Collection Time: 02/12/16 12:05 PM  Result Value Ref Range   Glucose-Capillary 226 (H) 65 - 99 mg/dL    Blood Alcohol level:  No results found for: [facility]  Metabolic Disorder Labs: Lab Results  Component Value Date   HGBA1C 10.0 (H) 02/09/2016   MPG 240 02/09/2016   Lab Results  Component Value Date   PROLACTIN 3.0 (L) 02/08/2016   Lab Results  Component Value Date   CHOL 197 02/08/2016   TRIG 269 (H) 02/08/2016   HDL 44 02/08/2016   CHOLHDL 4.5 02/08/2016   VLDL 54 (H) 02/08/2016   LDLCALC 99 02/08/2016  Physical Findings: AIMS: Facial and Oral Movements Muscles of Facial Expression: None, normal Lips and Perioral Area: None, normal Jaw: None, normal Tongue: None, normal,Extremity Movements Upper (arms, wrists, hands, fingers): None, normal Lower (legs, knees, ankles, toes): None, normal, Trunk Movements Neck, shoulders, hips: None, normal, Overall Severity Severity of abnormal movements (highest score from questions above): None, normal Incapacitation due to abnormal movements: None, normal Patient's awareness of abnormal movements (rate only patient's report): No Awareness, Dental Status Current problems with teeth and/or dentures?: No Does patient usually wear  dentures?: No  CIWA:    COWS:     Musculoskeletal: Strength & Muscle Tone: within normal limits Gait & Station: normal Patient leans: [name]  Psychiatric Specialty Exam: Physical Exam  Nursing note and vitals reviewed.   ROS denies headache, denies chest pain, no shortness of breath  Blood pressure (!) 103/59, pulse 79, temperature 98.1 F (36.7 C), temperature source Oral, resp. rate 18, height 6' (1.829 m), weight 187 lb (84.8 kg), SpO2 98 %.Body mass index is 25.36 kg/m.  General Appearance: Fairly Groomed  Eye Contact:  Good  Speech:  Normal Rate  Volume:  Normal  Mood:  Still depressed but has improved partially compared to admission  Affect:  Still constricted, but improving, smiles at times appropriately during session   Thought Process:  Linear  Orientation:  Full (Time, Place, and Person)  Thought Content:  Reports auditory hallucinations have decreased significantly, and does not express any delusions , does not appear internally preoccupied   Suicidal Thoughts:  At this time denies any plan or intention of hurting self or of suicide and contracts for safety on unit   Homicidal Thoughts:  No  Memory:  Immediate;   Good Recent;   Good Remote;   Good  Judgement:  Improving   Insight:   Improving   Psychomotor Activity:  Normal  Concentration:  Concentration: Good and Attention Span: Good  Recall:  Good  Fund of Knowledge:  Good  Language:  Good  Akathisia:  Negative  Handed:  Right  AIMS (if indicated):     Assets:  Desire for Improvement Resilience  ADL's:  Intact  Cognition:  WNL  Sleep:  Number of Hours: 4    Assessment - patient continues to gradually improve, and states he is less depressed, does present with slowly improving range of affect, and is more future oriented . Also reports auditory hallucinations have subsided. Major complaint at this time is ongoing insomnia in spite of Trazodone, which he tolerated well, but which " did not help that  much".  Treatment Plan Summary: Daily contact with patient to assess and evaluate symptoms and progress in treatment and Medication management  Encourage group and milieu participation to work on coping skills and symptom reduction  Continue  Abilify  10 mgrs QDAY to for  mood and psychotic symptoms Increase  Effexor XR to 225 mgrs QAM for ongoing depression  Continue Trazodone  100 mgrs QHS PRN for insomnia  Continue Vistaril PRNs for anxiety as needed  Continue DM management  Treatment team working on disposition options -  Patient is requesting   being referred for group home placement:  [place] working on PASSR/ otherforms needed for this disposition process .    Neita Garnet, MD  02/12/2016, 3:45 PMPatient ID: Sung Amabile, male   DOB: 11-12-1975, 40 y.o.   MRN: [id number]

## 2016-02-12 NOTE — NC FL2 (Signed)
[name] MEDICAID FL2 LEVEL OF CARE SCREENING TOOL     IDENTIFICATION  Patient Name: Tony Cunningham Birthdate: 01/15/1976 Sex: male Admission Date (Current Location): 02/07/2016  [facility] and [place] Number:  [name] [id number] T Facility and Address:   [facility], [address] [place]  [address])      Provider Number: [id number]  Attending Physician Name and Address:  Nehemiah Massed MD  Relative Name and Phone Number:       Current Level of Care: Hospital Recommended Level of Care: Assisted Living Facility, Family Care Home Prior Approval Number:    Date Approved/Denied:   PASRR Number:    Discharge Plan: Domiciliary (Rest home)    Current Diagnoses: Patient Active Problem List   Diagnosis Date Noted  . MDD (major depressive disorder), recurrent, severe, with psychosis ([facility]) 02/07/2016  . Sinus complaint 12/25/2010  . Diabetes mellitus 12/25/2010  . Depression 12/25/2010  . Anxiety 12/25/2010  . High blood pressure 12/25/2010    Orientation RESPIRATION BLADDER Height & Weight     Self, Time, Situation, Place (Oriented x4)  Normal Continent Weight: 187 lb (84.8 kg) Height:  6' (182.9 cm)  BEHAVIORAL SYMPTOMS/MOOD NEUROLOGICAL BOWEL NUTRITION STATUS      Continent Diet (Regular diet)  AMBULATORY STATUS COMMUNICATION OF NEEDS Skin   Independent Verbally                         Personal Care Assistance Level of Assistance  Bathing, Feeding, Dressing, Total care Bathing Assistance: Independent Feeding assistance: Independent Dressing Assistance: Independent Total Care Assistance: Independent   Functional Limitations Info  Sight, Hearing, Speech Sight Info: Adequate Hearing Info: Adequate Speech Info: Adequate    SPECIAL CARE FACTORS FREQUENCY                       Contractures Contractures Info: Not present    Additional Factors Info  Psychotropic, Allergies   Allergies Info: Allergic to Seroquel Psychotropic Info:  For meds, see MAR         Current Medications (02/12/2016):  This is the current hospital active medication list Current Facility-Administered Medications  Medication Dose Route Frequency Provider Last Rate Last Dose  . acetaminophen (TYLENOL) tablet 650 mg  650 mg Oral Q6H PRN Truman Hayward, FNP   650 mg at 02/12/16 1213  . alum & mag hydroxide-simeth (MAALOX/MYLANTA) 200-200-20 MG/5ML suspension 30 mL  30 mL Oral Q4H PRN Truman Hayward, FNP      . ARIPiprazole (ABILIFY) tablet 10 mg  10 mg Oral Daily Craige Cotta, MD   10 mg at 02/12/16 0809  . canagliflozin (INVOKANA) tablet 300 mg  300 mg Oral QAC breakfast Kristeen Mans, NP   300 mg at 02/12/16 0631  . diclofenac sodium (VOLTAREN) 1 % transdermal gel 2 g  2 g Topical BID Adonis Brook, NP   2 g at 02/12/16 0811  . lisinopril (PRINIVIL,ZESTRIL) tablet 20 mg  20 mg Oral Daily Saramma Eappen, MD   20 mg at 02/12/16 0809   And  . hydrochlorothiazide (HYDRODIURIL) tablet 25 mg  25 mg Oral Daily Saramma Eappen, MD   25 mg at 02/12/16 0811  . hydrOXYzine (ATARAX/VISTARIL) tablet 25 mg  25 mg Oral TID PRN Truman Hayward, FNP   25 mg at 02/12/16 1212  . Influenza vac split quadrivalent PF (FLUARIX) injection 0.5 mL  0.5 mL Intramuscular Tomorrow-1000 Jomarie Longs, MD      .  insulin aspart (novoLOG) injection 0-20 Units  0-20 Units Subcutaneous TID WC Kristeen Mans, NP   7 Units at 02/12/16 1214  . insulin aspart (novoLOG) injection 0-5 Units  0-5 Units Subcutaneous QHS Kristeen Mans, NP   5 Units at 02/11/16 2102  . insulin aspart (novoLOG) injection 6 Units  6 Units Subcutaneous TID WC Kristeen Mans, NP   6 Units at 02/12/16 1215  . insulin glargine (LANTUS) injection 18 Units  18 Units Subcutaneous QHS Kristeen Mans, NP   18 Units at 02/11/16 2102  . magnesium hydroxide (MILK OF MAGNESIA) suspension 30 mL  30 mL Oral Daily PRN Truman Hayward, FNP      . metFORMIN (GLUCOPHAGE) tablet 500 mg  500 mg Oral BID WC Kristeen Mans, NP   500  mg at 02/12/16 0809  . montelukast (SINGULAIR) tablet 10 mg  10 mg Oral QHS Kristeen Mans, NP   10 mg at 02/11/16 2101  . nicotine (NICODERM CQ - dosed in mg/24 hours) patch 21 mg  21 mg Transdermal Daily Jomarie Longs, MD   21 mg at 02/12/16 4585  . pregabalin (LYRICA) capsule 100 mg  100 mg Oral TID Kristeen Mans, NP   100 mg at 02/12/16 1213  . simvastatin (ZOCOR) tablet 40 mg  40 mg Oral QHS Kristeen Mans, NP   40 mg at 02/11/16 2101  . traZODone (DESYREL) tablet 100 mg  100 mg Oral QHS PRN Craige Cotta, MD   100 mg at 02/11/16 2137  . venlafaxine XR (EFFEXOR-XR) 24 hr capsule 150 mg  150 mg Oral Q breakfast Kristeen Mans, NP   150 mg at 02/12/16 9292     Discharge Medications: Please see discharge summary for a list of discharge medications.  Relevant Imaging Results:  Relevant Lab Results:   Additional Information    Sallee Lange, LCSW

## 2016-02-12 NOTE — [facility] Group Notes (Signed)
[facility] LCSW Group Therapy 02/12/2016 1:15 PM  Type of Therapy: Group Therapy- Emotion Regulation  Participation Level: Reserved  Participation Quality:  Appropriate  Affect: Appropriate  Cognitive: Alert and Oriented   Insight:  Developing/Improving  Engagement in Therapy: Developing/Improving and Engaged   Modes of Intervention: Clarification, Confrontation, Discussion, Education, Exploration, Limit-setting, Orientation, Problem-solving, Rapport Building, Dance movement psychotherapist, Socialization and Support  Summary of Progress/Problems: The topic for group today was emotional regulation. This group focused on both positive and negative emotion identification and allowed group members to process ways to identify feelings, regulate negative emotions, and find healthy ways to manage internal/external emotions. Group members were asked to reflect on a time when their reaction to an emotion led to a negative outcome and explored how alternative responses using emotion regulation would have benefited them. Group members were also asked to discuss a time when emotion regulation was utilized when a negative emotion was experienced. Pt was reserved in discussion but was able to identify coping skills that could help regulate his anxiety and anger.    Chad Cordial, LCSWA 02/12/2016 4:02 PM

## 2016-02-12 NOTE — Progress Notes (Signed)
Recreation Therapy Notes  Date: 02/12/16 Time: 0930 Location: [address] Group Room  Group Topic: Stress Management  Goal Area(s) Addresses:  Patient will verbalize importance of using healthy stress management.  Patient will identify positive emotions associated with healthy stress management.   Intervention: Stress Management  Activity :  Karlton Lemon Imagery.  LRT introduced the technique of guided imagery to patients.  Patients were to follow along as LRT read script.  LRT read script so patients could participate in the technique.  Education:  Stress Management, Discharge Planning.   Education Outcome: Needs additional education  Clinical Observations/Feedback: Pt did not attend group.   [name], LRT/CTRS         [name] A 02/12/2016 12:25 PM

## 2016-02-13 LAB — GLUCOSE, CAPILLARY
GLUCOSE-CAPILLARY: 306 mg/dL — AB (ref 65–99)
Glucose-Capillary: 208 mg/dL — ABNORMAL HIGH (ref 65–99)
Glucose-Capillary: 150 mg/dL — ABNORMAL HIGH (ref 65–99)
Glucose-Capillary: 214 mg/dL — ABNORMAL HIGH (ref 65–99)

## 2016-02-13 MED ORDER — INSULIN GLARGINE 100 UNIT/ML ~~LOC~~ SOLN
20.0000 [IU] | Freq: Every day | SUBCUTANEOUS | Status: DC
  Administered 2016-02-13 – 2016-02-16 (×4): 20 [IU] via SUBCUTANEOUS

## 2016-02-13 MED ORDER — INSULIN ASPART 100 UNIT/ML ~~LOC~~ SOLN
8.0000 [IU] | Freq: Three times a day (TID) | SUBCUTANEOUS | Status: DC
Start: 2016-02-13 — End: 2016-02-17
  Administered 2016-02-13 – 2016-02-17 (×12): 8 [IU] via SUBCUTANEOUS

## 2016-02-13 NOTE — Progress Notes (Signed)
Adult Psychoeducational Group Note  Date:  02/13/2016 Time:  0900 am  Group Topic/Focus:  Orientation:   The focus of this group is to educate the patient on the purpose and policies of crisis stabilization and provide a format to answer questions about their admission.  The group details unit policies and expectations of patients while admitted.   Participation Level:  Active  Participation Quality:  Appropriate  Affect:  Anxious  Cognitive:  Appropriate  Insight: Appropriate  Engagement in Group:  Engaged  Modes of Intervention:  Discussion and Education  Additional Comments:   Celicia Minahan L 02/13/2016, 4:53 PM

## 2016-02-13 NOTE — Progress Notes (Signed)
Patient ID: Tony Cunningham, male   DOB: 1975-06-10, 40 y.o.   MRN: [id number]   Pt currently presents with a flat affect and sullen behavior. Pt reports to writer that their goal was to "see my family tonight." Pt states "I am sad" and ranks his "sadnnes" at an 8/10. Reports that he was happy and sad to see his family. Pt reports poor sleep with current medication regimen.   Pt provided with medications per providers orders. Pt's labs and vitals were monitored throughout the night. Pt supported emotionally and encouraged to express concerns and questions. Pt educated on medications and insulin injections.  Pt's safety ensured with 15 minute and environmental checks. Pt currently denies SI/HI and A/V hallucinations. Pt verbally agrees to seek staff if SI/HI or A/VH occurs and to consult with staff before acting on any harmful thoughts. Pt reports that he has mixed feelings about his discharge plan to reside in a group home. Will continue POC.

## 2016-02-13 NOTE — Progress Notes (Signed)
[facility] MD Progress Note  02/13/2016 4:51 PM Sair Faulcon  MRN:  [id number] Subjective:  Patient reports improving mood today, states " today is a better day ". He complains of chronic back and neck pain but states " it really does not bother me that much". He states he is motivated in going to [place] - states he is worried because " nobody has come to see me yet ". At this time reports improved sleep, energy level, and appetite. At this time denies medication side effects   Objective:  I have discussed case with treatment team and have met with patient. He has been visible on unit, and has been going to some groups . He states he has felt more sociable and that he has been " talking a little more " in groups and with peers . He presents with partially improved range of affect , smiles briefly at times . Eating pop corn, calm, cooperative on approach. Denies medication side effects. No disruptive or agitated behaviors on unit, behavior calm and in control . He is invested in going to a group home, states " it is the best plan for me ". Expresses concern that " nobody has come to see me yet ".  Of note, denies any hallucinations today and does not appear internally preoccupied       Principal Problem: MDD (major depressive disorder), recurrent, severe, with psychosis ([place]) Diagnosis:   Patient Active Problem List   Diagnosis Date Noted  . MDD (major depressive disorder), recurrent, severe, with psychosis ([place]) [F33.3] 02/07/2016  . Sinus complaint [R09.89] 12/25/2010  . Diabetes mellitus [250] 12/25/2010  . Depression [F32.9] 12/25/2010  . Anxiety [F41.9] 12/25/2010  . High blood pressure [I10] 12/25/2010   Total Time spent with patient: 20 minutes   Past Psychiatric History: see HPI  Past Medical History:  Past Medical History:  Diagnosis Date  . Anxiety   . Asthma   . Diabetes mellitus   . High blood pressure   . Sinus complaint    History reviewed. No pertinent surgical  history. Family History: History reviewed. No pertinent family history. Family Psychiatric  History: see HPI Social History:  History  Alcohol Use No     History  Drug Use No    Social History   Social History  . Marital status: Single    Spouse name: [name]  . Number of children: [name]  . Years of education: [name]   Social History Main Topics  . Smoking status: Current Every Day Smoker    Packs/day: 0.50    Types: Cigarettes  . Smokeless tobacco: Never Used  . Alcohol use No  . Drug use: No  . Sexual activity: Not Currently   Other Topics Concern  . None   Social History Narrative  . None   Additional Social History:    Pain Medications: Ibuprofen Prescriptions: see MAR Over the Counter: see MAR History of alcohol / drug use?: No history of alcohol / drug abuse      Sleep:   Improving   Appetite:  Good  Current Medications: Current Facility-Administered Medications  Medication Dose Route Frequency Provider Last Rate Last Dose  . acetaminophen (TYLENOL) tablet 650 mg  650 mg Oral Q6H PRN Nanci Pina, FNP   650 mg at 02/12/16 1213  . alum & mag hydroxide-simeth (MAALOX/MYLANTA) 200-200-20 MG/5ML suspension 30 mL  30 mL Oral Q4H PRN Nanci Pina, FNP      . ARIPiprazole (ABILIFY) tablet 10  mg  10 mg Oral Daily Jenne Campus, MD   10 mg at 02/13/16 0844  . canagliflozin (INVOKANA) tablet 300 mg  300 mg Oral QAC breakfast Lurena Nida, NP   300 mg at 02/13/16 9417  . diclofenac sodium (VOLTAREN) 1 % transdermal gel 2 g  2 g Topical BID Kerrie Buffalo, NP   2 g at 02/12/16 1630  . lisinopril (PRINIVIL,ZESTRIL) tablet 20 mg  20 mg Oral Daily Ursula Alert, MD   20 mg at 02/13/16 0844   And  . hydrochlorothiazide (HYDRODIURIL) tablet 25 mg  25 mg Oral Daily Ursula Alert, MD   25 mg at 02/13/16 0844  . hydrOXYzine (ATARAX/VISTARIL) tablet 25 mg  25 mg Oral TID PRN Nanci Pina, FNP   25 mg at 02/13/16 1212  . Influenza vac split quadrivalent PF (FLUARIX)  injection 0.5 mL  0.5 mL Intramuscular Tomorrow-1000 Saramma Eappen, MD      . insulin aspart (novoLOG) injection 0-20 Units  0-20 Units Subcutaneous TID WC Lurena Nida, NP   3 Units at 02/13/16 1214  . insulin aspart (novoLOG) injection 0-5 Units  0-5 Units Subcutaneous QHS Lurena Nida, NP   2 Units at 02/12/16 2142  . insulin aspart (novoLOG) injection 8 Units  8 Units Subcutaneous TID WC Kerrie Buffalo, NP      . insulin glargine (LANTUS) injection 20 Units  20 Units Subcutaneous QHS Kerrie Buffalo, NP      . magnesium hydroxide (MILK OF MAGNESIA) suspension 30 mL  30 mL Oral Daily PRN Nanci Pina, FNP      . metFORMIN (GLUCOPHAGE) tablet 500 mg  500 mg Oral BID WC Lurena Nida, NP   500 mg at 02/13/16 0844  . montelukast (SINGULAIR) tablet 10 mg  10 mg Oral QHS Lurena Nida, NP   10 mg at 02/12/16 2131  . nicotine (NICODERM CQ - dosed in mg/24 hours) patch 21 mg  21 mg Transdermal Daily Ursula Alert, MD   21 mg at 02/13/16 0849  . pregabalin (LYRICA) capsule 100 mg  100 mg Oral TID Lurena Nida, NP   100 mg at 02/13/16 1212  . simvastatin (ZOCOR) tablet 40 mg  40 mg Oral QHS Lurena Nida, NP   40 mg at 02/12/16 2131  . traZODone (DESYREL) tablet 100 mg  100 mg Oral QHS PRN Jenne Campus, MD   100 mg at 02/12/16 2139  . venlafaxine XR (EFFEXOR-XR) 24 hr capsule 225 mg  225 mg Oral Q breakfast Jenne Campus, MD   225 mg at 02/13/16 0844    Lab Results:  Results for orders placed or performed during the hospital encounter of 02/07/16 (from the past 48 hour(s))  Glucose, capillary     Status: Abnormal   Collection Time: 02/11/16  5:16 PM  Result Value Ref Range   Glucose-Capillary 245 (H) 65 - 99 mg/dL   Comment 1 Notify RN    Comment 2 Document in Chart   Glucose, capillary     Status: Abnormal   Collection Time: 02/11/16  7:58 PM  Result Value Ref Range   Glucose-Capillary 358 (H) 65 - 99 mg/dL  Glucose, capillary     Status: Abnormal   Collection Time: 02/12/16   5:59 AM  Result Value Ref Range   Glucose-Capillary 166 (H) 65 - 99 mg/dL  Glucose, capillary     Status: Abnormal   Collection Time: 02/12/16 12:05 PM  Result Value Ref  Range   Glucose-Capillary 226 (H) 65 - 99 mg/dL  Glucose, capillary     Status: Abnormal   Collection Time: 02/12/16  5:10 PM  Result Value Ref Range   Glucose-Capillary 393 (H) 65 - 99 mg/dL  Glucose, capillary     Status: Abnormal   Collection Time: 02/12/16  8:26 PM  Result Value Ref Range   Glucose-Capillary 233 (H) 65 - 99 mg/dL  Glucose, capillary     Status: Abnormal   Collection Time: 02/13/16  6:24 AM  Result Value Ref Range   Glucose-Capillary 208 (H) 65 - 99 mg/dL   Comment 1 QC Due   Glucose, capillary     Status: Abnormal   Collection Time: 02/13/16 12:05 PM  Result Value Ref Range   Glucose-Capillary 150 (H) 65 - 99 mg/dL    Blood Alcohol level:  No results found for: [facility]  Metabolic Disorder Labs: Lab Results  Component Value Date   HGBA1C 10.0 (H) 02/09/2016   MPG 240 02/09/2016   Lab Results  Component Value Date   PROLACTIN 3.0 (L) 02/08/2016   Lab Results  Component Value Date   CHOL 197 02/08/2016   TRIG 269 (H) 02/08/2016   HDL 44 02/08/2016   CHOLHDL 4.5 02/08/2016   VLDL 54 (H) 02/08/2016   LDLCALC 99 02/08/2016    Physical Findings: AIMS: Facial and Oral Movements Muscles of Facial Expression: None, normal Lips and Perioral Area: None, normal Jaw: None, normal Tongue: None, normal,Extremity Movements Upper (arms, wrists, hands, fingers): None, normal Lower (legs, knees, ankles, toes): None, normal, Trunk Movements Neck, shoulders, hips: None, normal, Overall Severity Severity of abnormal movements (highest score from questions above): None, normal Incapacitation due to abnormal movements: None, normal Patient's awareness of abnormal movements (rate only patient's report): No Awareness, Dental Status Current problems with teeth and/or dentures?: No Does patient  usually wear dentures?: No  CIWA:    COWS:     Musculoskeletal: Strength & Muscle Tone: within normal limits Gait & Station: normal Patient leans: [name]  Psychiatric Specialty Exam: Physical Exam  Nursing note and vitals reviewed.   ROS denies headache, denies chest pain, no shortness of breath, describes neck and lower back pain, discomfort   Blood pressure 95/67, pulse 84, temperature 98 F (36.7 C), temperature source Oral, resp. rate 16, height 6' (1.829 m), weight 187 lb (84.8 kg), SpO2 98 %.Body mass index is 25.36 kg/m.  General Appearance:  Grooming has tended to improve   Eye Contact:  Good  Speech:  Normal Rate  Volume:  Normal  Mood:   Less depressed, partially improved mood   Affect:   improving, more reactive   Thought Process:  Linear  Orientation:  Full (Time, Place, and Person)  Thought Content:  Reports improved hallucinations and states he has had no hallucinations today- does not appear internally preoccupied   Suicidal Thoughts:  At this time denies any plan or intention of hurting self or of suicide and contracts for safety on unit   Homicidal Thoughts:  No denies any violent or homicidal ideations   Memory:  Immediate;   Good Recent;   Good Remote;   Good  Judgement:  Improving   Insight:   Improving   Psychomotor Activity:  Normal  Concentration:  Concentration: Good and Attention Span: Good  Recall:  Good  Fund of Knowledge:  Good  Language:  Good  Akathisia:  Negative  Handed:  Right  AIMS (if indicated):     Assets:  Desire for Improvement Resilience  ADL's:  Intact  Cognition:  WNL  Sleep:  Number of Hours: 4.75    Assessment - Patient continues to improve gradually but significantly compared to his admission presentation - mood is improving , affect is more reactive, he is less avoidant and more visible in day room, grooming is improved, hallucinations have resolved. At this time treatment plan continues to be for patient to go to a Group Home  on discharge. Thus far tolerating medications well .   Treatment Plan Summary: Daily contact with patient to assess and evaluate symptoms and progress in treatment and Medication management  Encourage group and milieu participation to work on coping skills and symptom reduction  Continue  Abilify  10 mgrs QDAY to for  mood and psychotic symptoms Continue   Effexor XR  225 mgrs QAM for ongoing depression  Continue Trazodone  100 mgrs QHS PRN for insomnia  Continue Vistaril PRNs for anxiety as needed  Continue DM management  Treatment team working on disposition options -  Patient is requesting   being referred for group home placement:  [place] working on PASSR/ otherforms needed for this disposition process .    Neita Garnet, MD  02/13/2016, 4:50 PMPatient ID: Sung Amabile, male   DOB: 1976-01-31, 40 y.o.   MRN: [id number]

## 2016-02-13 NOTE — Progress Notes (Signed)
Pt attended karaoke group this evening.  

## 2016-02-13 NOTE — Progress Notes (Signed)
D: Pt presents with flat affect and depressed mood. Pt rates depression 6/10. Anxiety 7/10. Hopeless 7/10. Pt  Denies suicidal thoughts and verbally contracts for safety. Pt requested prn med for anxiety. Antianxiety med given at pt request. Pt c/o ongoing neck pain. Pt reported having pain to his neck for the last 4 weeks. Pt denies injury to neck. Pt given scheduled meds for discomfort. MD made aware.  A:  Diabetes recommendations adjusted by Gaspar Cola, NP. Medications administered as ordered per MD. Medications reviewed with pt. Verbal support provided. Pt encouraged to attend groups. 15 minute checks performed for safety.  R: Pt receptive to tx.

## 2016-02-13 NOTE — [facility] Group Notes (Signed)
[facility] Group Therapy 02/13/2016 1:15pm  Type of Therapy: Mental Health Association Presentation  Participation Level: Active  Participation Quality: Attentive  Affect: Appropriate  Cognitive: Oriented  Insight: Developing/Improving  Engagement in Therapy: Engaged  Modes of Intervention: Discussion, Education and Socialization  Summary of Progress/Problems: Mental Health Association (MHA) Speaker came to talk about his personal journey with substance abuse and addiction. The pt processed ways by which to relate to the speaker. MHA speaker provided handouts and educational information pertaining to groups and services offered by the [facility]. Pt was engaged in speaker's presentation and was receptive to resources provided.    Chad Cordial, LCSWA 02/13/2016 1:53 PM

## 2016-02-13 NOTE — Progress Notes (Signed)
Inpatient Diabetes Program Recommendations  AACE/ADA: New Consensus Statement on Inpatient Glycemic Control (2015)  Target Ranges:  Prepandial:   less than 140 mg/dL      Peak postprandial:   less than 180 mg/dL (1-2 hours)      Critically ill patients:  140 - 180 mg/dL   Lab Results  Component Value Date   GLUCAP 208 (H) 02/13/2016   HGBA1C 10.0 (H) 02/09/2016    Review of Glycemic Control    Review of Glycemic Control  FBS still elevated after addition of Lantus 18 units QHS. Post-prandial blood sugars continue to be elevated. Needs insulin adjustment. HgbA1C of 10.0% indicates poor glycemic control at home.  Inpatient Diabetes Program Recommendations:    Increase Lantus to 20 units QHS Increase Novolog to 8 units tidwc for meal coverage insulin.  Pt will need to f/u with PCP for diabetes management after d/c.  Will continue to follow. Thank you. Ailene Ards, RD, LDN, CDE Inpatient Diabetes Coordinator [phone]

## 2016-02-14 LAB — GLUCOSE, CAPILLARY
GLUCOSE-CAPILLARY: 165 mg/dL — AB (ref 65–99)
GLUCOSE-CAPILLARY: 239 mg/dL — AB (ref 65–99)
Glucose-Capillary: 236 mg/dL — ABNORMAL HIGH (ref 65–99)
Glucose-Capillary: 249 mg/dL — ABNORMAL HIGH (ref 65–99)

## 2016-02-14 NOTE — Progress Notes (Signed)
[facility] Group Notes:  (Nursing/MHT/Case Management/Adjunct)  Date:  02/14/2016  Time:  11:55 PM  Type of Therapy:  Psychoeducational Skills  Participation Level:  Minimal  Participation Quality:  Attentive  Affect:  Appropriate  Cognitive:  Appropriate  Insight:  Appropriate  Engagement in Group:  Developing/Improving  Modes of Intervention:  Education  Summary of Progress/Problems: Patient verbalized that he had a good day overall and that he spent his time coloring. As for the theme of the day, his relapse prevention will involve trying to stay as busy as possible.   Hazle Coca S 02/14/2016, 11:55 PM

## 2016-02-14 NOTE — Progress Notes (Signed)
[name] MD Progress Note  02/14/2016 4:19 PM Tony Cunningham  MRN:  [id number] Subjective:  States that he is getting better and better. Per nursing, he spends time in group but also tends to sleep a lot. He is compliant with taking meds.  We are monitoring blood sugars.   Objective:  I have discussed case with treatment team and have met with patient. Patient is brighter and mood has improved.  Seen attending groups.  Denies medication side effects. No disruptive or agitated behaviors on unit, behavior calm and in control . Denies any hallucinations today and does not appear internally preoccupied       Principal Problem: MDD (major depressive disorder), recurrent, severe, with psychosis ([place]) Diagnosis:   Patient Active Problem List   Diagnosis Date Noted  . MDD (major depressive disorder), recurrent, severe, with psychosis ([place]) [F33.3] 02/07/2016    Priority: High  . Sinus complaint [R09.89] 12/25/2010  . Diabetes mellitus [250] 12/25/2010  . Depression [F32.9] 12/25/2010  . Anxiety [F41.9] 12/25/2010  . High blood pressure [I10] 12/25/2010   Total Time spent with patient: 20 minutes   Past Psychiatric History: see HPI  Past Medical History:  Past Medical History:  Diagnosis Date  . Anxiety   . Asthma   . Diabetes mellitus   . High blood pressure   . Sinus complaint    History reviewed. No pertinent surgical history. Family History: History reviewed. No pertinent family history. Family Psychiatric  History: see HPI Social History:  History  Alcohol Use No     History  Drug Use No    Social History   Social History  . Marital status: Single    Spouse name: [name]  . Number of children: [name]  . Years of education: [name]   Social History Main Topics  . Smoking status: Current Every Day Smoker    Packs/day: 0.50    Types: Cigarettes  . Smokeless tobacco: Never Used  . Alcohol use No  . Drug use: No  . Sexual activity: Not Currently   Other Topics Concern  . None    Social History Narrative  . None   Additional Social History:    Pain Medications: Ibuprofen Prescriptions: see MAR Over the Counter: see MAR History of alcohol / drug use?: No history of alcohol / drug abuse      Sleep:   Improving   Appetite:  Good  Current Medications: Current Facility-Administered Medications  Medication Dose Route Frequency Provider Last Rate Last Dose  . acetaminophen (TYLENOL) tablet 650 mg  650 mg Oral Q6H PRN Nanci Pina, FNP   650 mg at 02/14/16 1226  . alum & mag hydroxide-simeth (MAALOX/MYLANTA) 200-200-20 MG/5ML suspension 30 mL  30 mL Oral Q4H PRN Nanci Pina, FNP      . ARIPiprazole (ABILIFY) tablet 10 mg  10 mg Oral Daily Jenne Campus, MD   10 mg at 02/14/16 0854  . canagliflozin (INVOKANA) tablet 300 mg  300 mg Oral QAC breakfast Lurena Nida, NP   300 mg at 02/14/16 0630  . diclofenac sodium (VOLTAREN) 1 % transdermal gel 2 g  2 g Topical BID Kerrie Buffalo, NP   2 g at 02/14/16 0856  . lisinopril (PRINIVIL,ZESTRIL) tablet 20 mg  20 mg Oral Daily Ursula Alert, MD   20 mg at 02/13/16 0844   And  . hydrochlorothiazide (HYDRODIURIL) tablet 25 mg  25 mg Oral Daily Ursula Alert, MD   25 mg at 02/13/16 0844  .  hydrOXYzine (ATARAX/VISTARIL) tablet 25 mg  25 mg Oral TID PRN Nanci Pina, FNP   25 mg at 02/14/16 0420  . Influenza vac split quadrivalent PF (FLUARIX) injection 0.5 mL  0.5 mL Intramuscular Tomorrow-1000 Saramma Eappen, MD      . insulin aspart (novoLOG) injection 0-20 Units  0-20 Units Subcutaneous TID WC Lurena Nida, NP      . insulin aspart (novoLOG) injection 0-5 Units  0-5 Units Subcutaneous QHS Lurena Nida, NP   4 Units at 02/13/16 2222  . insulin aspart (novoLOG) injection 8 Units  8 Units Subcutaneous TID WC Kerrie Buffalo, NP   8 Units at 02/14/16 1223  . insulin glargine (LANTUS) injection 20 Units  20 Units Subcutaneous QHS Kerrie Buffalo, NP   20 Units at 02/13/16 2223  . magnesium hydroxide (MILK OF  MAGNESIA) suspension 30 mL  30 mL Oral Daily PRN Nanci Pina, FNP      . metFORMIN (GLUCOPHAGE) tablet 500 mg  500 mg Oral BID WC Lurena Nida, NP   500 mg at 02/14/16 0854  . montelukast (SINGULAIR) tablet 10 mg  10 mg Oral QHS Lurena Nida, NP   10 mg at 02/13/16 2217  . nicotine (NICODERM CQ - dosed in mg/24 hours) patch 21 mg  21 mg Transdermal Daily Saramma Eappen, MD   21 mg at 02/14/16 0800  . pregabalin (LYRICA) capsule 100 mg  100 mg Oral TID Lurena Nida, NP   100 mg at 02/14/16 1225  . simvastatin (ZOCOR) tablet 40 mg  40 mg Oral QHS Lurena Nida, NP   40 mg at 02/13/16 2217  . traZODone (DESYREL) tablet 100 mg  100 mg Oral QHS PRN Jenne Campus, MD   100 mg at 02/13/16 2242  . venlafaxine XR (EFFEXOR-XR) 24 hr capsule 225 mg  225 mg Oral Q breakfast Jenne Campus, MD   225 mg at 02/14/16 0854    Lab Results:  Results for orders placed or performed during the hospital encounter of 02/07/16 (from the past 48 hour(s))  Glucose, capillary     Status: Abnormal   Collection Time: 02/12/16  5:10 PM  Result Value Ref Range   Glucose-Capillary 393 (H) 65 - 99 mg/dL  Glucose, capillary     Status: Abnormal   Collection Time: 02/12/16  8:26 PM  Result Value Ref Range   Glucose-Capillary 233 (H) 65 - 99 mg/dL  Glucose, capillary     Status: Abnormal   Collection Time: 02/13/16  6:24 AM  Result Value Ref Range   Glucose-Capillary 208 (H) 65 - 99 mg/dL   Comment 1 QC Due   Glucose, capillary     Status: Abnormal   Collection Time: 02/13/16 12:05 PM  Result Value Ref Range   Glucose-Capillary 150 (H) 65 - 99 mg/dL  Glucose, capillary     Status: Abnormal   Collection Time: 02/13/16  5:19 PM  Result Value Ref Range   Glucose-Capillary 214 (H) 65 - 99 mg/dL  Glucose, capillary     Status: Abnormal   Collection Time: 02/13/16  9:37 PM  Result Value Ref Range   Glucose-Capillary 306 (H) 65 - 99 mg/dL  Glucose, capillary     Status: Abnormal   Collection Time: 02/14/16   5:35 AM  Result Value Ref Range   Glucose-Capillary 236 (H) 65 - 99 mg/dL  Glucose, capillary     Status: Abnormal   Collection Time: 02/14/16 12:14 PM  Result  Value Ref Range   Glucose-Capillary 165 (H) 65 - 99 mg/dL    Blood Alcohol level:  No results found for: [facility]  Metabolic Disorder Labs: Lab Results  Component Value Date   HGBA1C 10.0 (H) 02/09/2016   MPG 240 02/09/2016   Lab Results  Component Value Date   PROLACTIN 3.0 (L) 02/08/2016   Lab Results  Component Value Date   CHOL 197 02/08/2016   TRIG 269 (H) 02/08/2016   HDL 44 02/08/2016   CHOLHDL 4.5 02/08/2016   VLDL 54 (H) 02/08/2016   LDLCALC 99 02/08/2016    Physical Findings: AIMS: Facial and Oral Movements Muscles of Facial Expression: None, normal Lips and Perioral Area: None, normal Jaw: None, normal Tongue: None, normal,Extremity Movements Upper (arms, wrists, hands, fingers): None, normal Lower (legs, knees, ankles, toes): None, normal, Trunk Movements Neck, shoulders, hips: None, normal, Overall Severity Severity of abnormal movements (highest score from questions above): None, normal Incapacitation due to abnormal movements: None, normal Patient's awareness of abnormal movements (rate only patient's report): No Awareness, Dental Status Current problems with teeth and/or dentures?: No Does patient usually wear dentures?: No  CIWA:    COWS:     Musculoskeletal: Strength & Muscle Tone: within normal limits Gait & Station: normal Patient leans: [name]  Psychiatric Specialty Exam: Physical Exam  Nursing note and vitals reviewed.   ROS denies headache, denies chest pain, no shortness of breath, describes neck and lower back pain, discomfort   Blood pressure 113/61, pulse (!) 101, temperature 98.1 F (36.7 C), temperature source Oral, resp. rate 16, height 6' (1.829 m), weight 84.8 kg (187 lb), SpO2 98 %.Body mass index is 25.36 kg/m.  General Appearance:  Grooming has tended to improve   Eye  Contact:  Good  Speech:  Normal Rate  Volume:  Normal  Mood:   Less depressed, partially improved mood   Affect:   improving, more reactive   Thought Process:  Linear  Orientation:  Full (Time, Place, and Person)  Thought Content:  Reports improved hallucinations and states he has had no hallucinations today- does not appear internally preoccupied   Suicidal Thoughts:  At this time denies any plan or intention of hurting self or of suicide and contracts for safety on unit   Homicidal Thoughts:  No denies any violent or homicidal ideations   Memory:  Immediate;   Good Recent;   Good Remote;   Good  Judgement:  Improving   Insight:   Improving   Psychomotor Activity:  Normal  Concentration:  Concentration: Good and Attention Span: Good  Recall:  Good  Fund of Knowledge:  Good  Language:  Good  Akathisia:  Negative  Handed:  Right  AIMS (if indicated):     Assets:  Desire for Improvement Resilience  ADL's:  Intact  Cognition:  WNL  Sleep:  Number of Hours: 5    Assessment - Patient continues to improve gradually but significantly compared to his admission presentation - mood is improving , affect is more reactive, he is less avoidant and more visible in day room, grooming is improved, hallucinations have resolved. At this time treatment plan continues to be for patient to go to a Group Home on discharge. Thus far tolerating medications well .   Treatment Plan Summary: Daily contact with patient to assess and evaluate symptoms and progress in treatment and Medication management  Encourage group and milieu participation to work on coping skills and symptom reduction  Continue  Abilify  10 mgrs  QDAY to for  mood and psychotic symptoms Continue   Effexor XR  225 mgrs QAM for ongoing depression  Continue Trazodone  100 mgrs QHS PRN for insomnia  Continue Vistaril PRNs for anxiety as needed  Continue DM management  Treatment team working on disposition options -  Patient is requesting    being referred for group home placement:  [facility] working on PASSR/ otherforms needed for this disposition process .    Janett Labella, NP [facility] 02/14/2016, 4:19 PM  Agree with NP progress note as above  Neita Garnet, MD

## 2016-02-14 NOTE — [facility] Group Notes (Signed)
[facility] LCSW Group Therapy 02/14/2016 1:15pm  Type of Therapy: Group Therapy- Feelings Around Relapse and Recovery  Participation Level: Reserved  Participation Quality:  Off-topic at times  Affect:  Withdrawn  Cognitive: Alert and Oriented   Insight:  Developing   Engagement in Therapy: Developing/Improving and Engaged   Modes of Intervention: Clarification, Confrontation, Discussion, Education, Exploration, Limit-setting, Orientation, Problem-solving, Rapport Building, Dance movement psychotherapist, Socialization and Support  Summary of Progress/Problems: The topic for today was feelings about relapse. The group discussed what relapse prevention is to them and identified triggers that they are on the path to relapse. Members also processed their feeling towards relapse and were able to relate to common experiences. Group also discussed coping skills that can be used for relapse prevention.  Pt attempted to participate in group discussion but it appeared that he had difficulty following the flow of conversation. He was able to identify a need to develop better coping skills to avoid relapse.    Therapeutic Modalities:   Cognitive Behavioral Therapy Solution-Focused Therapy Assertiveness Training Relapse Prevention Therapy    Lamar Sprinkles [phone] 02/14/2016 6:28 PM

## 2016-02-14 NOTE — Progress Notes (Signed)
Patient A/O, no noted distress. Denies SI/HI/AVH. Patient continues to c/o neck pain, administered prn pain regimens and scheduled. CBG 165 (lunch) and 239 (supper). Encourage patient this morning, not to stay in bed all day. He was able to master the task by seating in the day room. He stated he slept well. Depression 5/10, hopelessness 7/10, and anxiety 6/10. Educated patient on medications. Staff will continue to monitor, meet needs, and maintain safety.

## 2016-02-14 NOTE — Progress Notes (Signed)
Recreation Therapy Notes  Date: 02/14/16 Time: 0930 Location: 300 Hall Group Room  Group Topic: Stress Management  Goal Area(s) Addresses:  Patient will verbalize importance of using healthy stress management.  Patient will identify positive emotions associated with healthy stress management.   Behavioral Response: Engaged  Intervention: Stress Management  Activity :  [facility].  LRT introduced the concept of guided imagery.  LRT read a script to allow patients to participate in activity.  Patients were to follow along as LRT read script.  Education:  Stress Management, Discharge Planning.   Education Outcome: Acknowledges edcuation/In group clarification offered/Needs additional education  Clinical Observations/Feedback:  Pt attended group.   [name], LRT/CTRS         [name] A 02/14/2016 12:47 PM

## 2016-02-15 LAB — GLUCOSE, CAPILLARY
GLUCOSE-CAPILLARY: 220 mg/dL — AB (ref 65–99)
GLUCOSE-CAPILLARY: 293 mg/dL — AB (ref 65–99)
Glucose-Capillary: 166 mg/dL — ABNORMAL HIGH (ref 65–99)
Glucose-Capillary: 275 mg/dL — ABNORMAL HIGH (ref 65–99)

## 2016-02-15 NOTE — Progress Notes (Signed)
Data. Patient denies SI/HI/AVH. Affect continues to be blunt and his mood is depressed, per his report. His interactions with people are limited and patient reports he feels anxious R/T, "What people are saying about me", but when asked to elaborate, patient refused. Patient noted I the dayroom coloring. Patients hygiene poor and has odor. New scrubs given and patient encouraged to bathe and change cloths and have his personal cloths washed. On his self assessment patient reports 7/10 for depression and 6/10 for hopelessness and anxiety. His goal for today is: "Color and stay busy".  Action. PRN given for anxiety. Emotional support and encouragement offered. Education provided on medication, indications and side effect. Q 15 minute checks done for safety. Response. Safety on the unit maintained through 15 minute checks.  PRN medication somewhat effective. Medications taken as prescribed. Attended groups. Remained calm and appropriate through out shift.

## 2016-02-15 NOTE — Plan of Care (Signed)
Problem: Medication: Goal: Compliance with prescribed medication regimen will improve Outcome: Progressing Patient was compliant with his scheduled medications.   

## 2016-02-15 NOTE — Progress Notes (Signed)
Patient has been up and active on the unit, attended group this evening and has voiced no complaints. Patient currently denies having pain, -si/hi/a/v hall. Support and encouragement offered, safety maintained on unit, will continue to monitor.  

## 2016-02-15 NOTE — Progress Notes (Signed)
[facility] MD Progress Note  02/15/2016 1:19 PM Tony Cunningham  MRN:  [id number] Subjective:  I'm Doing good. I worked on some I statements today.   Objective:  I have discussed case with treatment team and have met with patient. Patient is brighter and mood has improved, he continues to present as constricted. He acknowledges Probation officer at this time, and greets appropriately although conversation is limited.   Seen attending groups, and his goal today is "I statements." He does report having some coping skills of breathing, walking, and talking to people.  Denies medication side effects. No disruptive or agitated behaviors on unit, behavior calm and in control. Denies any hallucinations today and does not appear internally preoccupied.   Principal Problem: MDD (major depressive disorder), recurrent, severe, with psychosis ([place]) Diagnosis:   Patient Active Problem List   Diagnosis Date Noted  . MDD (major depressive disorder), recurrent, severe, with psychosis ([place]) [F33.3] 02/07/2016  . Sinus complaint [R09.89] 12/25/2010  . Diabetes mellitus [250] 12/25/2010  . Depression [F32.9] 12/25/2010  . Anxiety [F41.9] 12/25/2010  . High blood pressure [I10] 12/25/2010   Total Time spent with patient: 20 minutes   Past Psychiatric History: see HPI  Past Medical History:  Past Medical History:  Diagnosis Date  . Anxiety   . Asthma   . Diabetes mellitus   . High blood pressure   . Sinus complaint    History reviewed. No pertinent surgical history. Family History: History reviewed. No pertinent family history. Family Psychiatric  History: see HPI Social History:  History  Alcohol Use No     History  Drug Use No    Social History   Social History  . Marital status: Single    Spouse name: [name]  . Number of children: [name]  . Years of education: [name]   Social History Main Topics  . Smoking status: Current Every Day Smoker    Packs/day: 0.50    Types: Cigarettes  . Smokeless tobacco: Never Used   . Alcohol use No  . Drug use: No  . Sexual activity: Not Currently   Other Topics Concern  . None   Social History Narrative  . None   Additional Social History:    Pain Medications: Ibuprofen Prescriptions: see MAR Over the Counter: see MAR History of alcohol / drug use?: No history of alcohol / drug abuse      Sleep:   Improving   Appetite:  Good  Current Medications: Current Facility-Administered Medications  Medication Dose Route Frequency Provider Last Rate Last Dose  . acetaminophen (TYLENOL) tablet 650 mg  650 mg Oral Q6H PRN Nanci Pina, FNP   650 mg at 02/14/16 2208  . alum & mag hydroxide-simeth (MAALOX/MYLANTA) 200-200-20 MG/5ML suspension 30 mL  30 mL Oral Q4H PRN Nanci Pina, FNP      . ARIPiprazole (ABILIFY) tablet 10 mg  10 mg Oral Daily Jenne Campus, MD   10 mg at 02/15/16 0824  . canagliflozin (INVOKANA) tablet 300 mg  300 mg Oral QAC breakfast Lurena Nida, NP   300 mg at 02/15/16 9407  . diclofenac sodium (VOLTAREN) 1 % transdermal gel 2 g  2 g Topical BID Kerrie Buffalo, NP   2 g at 02/15/16 6808  . lisinopril (PRINIVIL,ZESTRIL) tablet 20 mg  20 mg Oral Daily Ursula Alert, MD   20 mg at 02/15/16 0824   And  . hydrochlorothiazide (HYDRODIURIL) tablet 25 mg  25 mg Oral Daily Ursula Alert, MD  25 mg at 02/15/16 0824  . hydrOXYzine (ATARAX/VISTARIL) tablet 25 mg  25 mg Oral TID PRN Nanci Pina, FNP   25 mg at 02/14/16 0420  . Influenza vac split quadrivalent PF (FLUARIX) injection 0.5 mL  0.5 mL Intramuscular Tomorrow-1000 Saramma Eappen, MD      . insulin aspart (novoLOG) injection 0-20 Units  0-20 Units Subcutaneous TID WC Lurena Nida, NP   4 Units at 02/15/16 1203  . insulin aspart (novoLOG) injection 0-5 Units  0-5 Units Subcutaneous QHS Lurena Nida, NP   2 Units at 02/14/16 2202  . insulin aspart (novoLOG) injection 8 Units  8 Units Subcutaneous TID WC Kerrie Buffalo, NP   8 Units at 02/15/16 1203  . insulin glargine (LANTUS)  injection 20 Units  20 Units Subcutaneous QHS Kerrie Buffalo, NP   20 Units at 02/14/16 2201  . magnesium hydroxide (MILK OF MAGNESIA) suspension 30 mL  30 mL Oral Daily PRN Nanci Pina, FNP      . metFORMIN (GLUCOPHAGE) tablet 500 mg  500 mg Oral BID WC Lurena Nida, NP   500 mg at 02/15/16 0823  . montelukast (SINGULAIR) tablet 10 mg  10 mg Oral QHS Lurena Nida, NP   10 mg at 02/14/16 2201  . nicotine (NICODERM CQ - dosed in mg/24 hours) patch 21 mg  21 mg Transdermal Daily Ursula Alert, MD   21 mg at 02/15/16 0831  . pregabalin (LYRICA) capsule 100 mg  100 mg Oral TID Lurena Nida, NP   100 mg at 02/15/16 1159  . simvastatin (ZOCOR) tablet 40 mg  40 mg Oral QHS Lurena Nida, NP   40 mg at 02/14/16 2201  . traZODone (DESYREL) tablet 100 mg  100 mg Oral QHS PRN Jenne Campus, MD   100 mg at 02/14/16 2201  . venlafaxine XR (EFFEXOR-XR) 24 hr capsule 225 mg  225 mg Oral Q breakfast Jenne Campus, MD   225 mg at 02/15/16 8088    Lab Results:  Results for orders placed or performed during the hospital encounter of 02/07/16 (from the past 48 hour(s))  Glucose, capillary     Status: Abnormal   Collection Time: 02/13/16  5:19 PM  Result Value Ref Range   Glucose-Capillary 214 (H) 65 - 99 mg/dL  Glucose, capillary     Status: Abnormal   Collection Time: 02/13/16  9:37 PM  Result Value Ref Range   Glucose-Capillary 306 (H) 65 - 99 mg/dL  Glucose, capillary     Status: Abnormal   Collection Time: 02/14/16  5:35 AM  Result Value Ref Range   Glucose-Capillary 236 (H) 65 - 99 mg/dL  Glucose, capillary     Status: Abnormal   Collection Time: 02/14/16 12:14 PM  Result Value Ref Range   Glucose-Capillary 165 (H) 65 - 99 mg/dL  Glucose, capillary     Status: Abnormal   Collection Time: 02/14/16  4:47 PM  Result Value Ref Range   Glucose-Capillary 239 (H) 65 - 99 mg/dL  Glucose, capillary     Status: Abnormal   Collection Time: 02/14/16  8:35 PM  Result Value Ref Range    Glucose-Capillary 249 (H) 65 - 99 mg/dL   Comment 1 Notify RN   Glucose, capillary     Status: Abnormal   Collection Time: 02/15/16  5:41 AM  Result Value Ref Range   Glucose-Capillary 220 (H) 65 - 99 mg/dL   Comment 1 Notify RN  Glucose, capillary     Status: Abnormal   Collection Time: 02/15/16 11:48 AM  Result Value Ref Range   Glucose-Capillary 166 (H) 65 - 99 mg/dL   Comment 1 Notify RN    Comment 2 Document in Chart     Blood Alcohol level:  No results found for: [facility]  Metabolic Disorder Labs: Lab Results  Component Value Date   HGBA1C 10.0 (H) 02/09/2016   MPG 240 02/09/2016   Lab Results  Component Value Date   PROLACTIN 3.0 (L) 02/08/2016   Lab Results  Component Value Date   CHOL 197 02/08/2016   TRIG 269 (H) 02/08/2016   HDL 44 02/08/2016   CHOLHDL 4.5 02/08/2016   VLDL 54 (H) 02/08/2016   LDLCALC 99 02/08/2016    Physical Findings: AIMS: Facial and Oral Movements Muscles of Facial Expression: None, normal Lips and Perioral Area: None, normal Jaw: None, normal Tongue: None, normal,Extremity Movements Upper (arms, wrists, hands, fingers): None, normal Lower (legs, knees, ankles, toes): None, normal, Trunk Movements Neck, shoulders, hips: None, normal, Overall Severity Severity of abnormal movements (highest score from questions above): None, normal Incapacitation due to abnormal movements: None, normal Patient's awareness of abnormal movements (rate only patient's report): No Awareness, Dental Status Current problems with teeth and/or dentures?: No Does patient usually wear dentures?: No  CIWA:    COWS:     Musculoskeletal: Strength & Muscle Tone: within normal limits Gait & Station: normal Patient leans: [name]  Psychiatric Specialty Exam: Physical Exam  Nursing note and vitals reviewed.   ROS  denies headache, denies chest pain, no shortness of breath, describes neck and lower back pain, discomfort   Blood pressure 108/69, pulse 79,  temperature 99.2 F (37.3 C), temperature source Oral, resp. rate 16, height 6' (1.829 m), weight 84.8 kg (187 lb), SpO2 98 %.Body mass index is 25.36 kg/m.  General Appearance:  Grooming has tended to improve   Eye Contact:  Good  Speech:  Normal Rate  Volume:  Normal  Mood:   Less depressed, partially improved mood   Affect:   improving, more reactive   Thought Process:  Linear  Orientation:  Full (Time, Place, and Person)  Thought Content:  Reports improved hallucinations and states he has had no hallucinations today- does not appear internally preoccupied   Suicidal Thoughts:  At this time denies any plan or intention of hurting self or of suicide and contracts for safety on unit   Homicidal Thoughts:  No denies any violent or homicidal ideations   Memory:  Immediate;   Good Recent;   Good Remote;   Good  Judgement:  Improving   Insight:   Improving   Psychomotor Activity:  Normal  Concentration:  Concentration: Good and Attention Span: Good  Recall:  Good  Fund of Knowledge:  Good  Language:  Good  Akathisia:  Negative  Handed:  Right  AIMS (if indicated):     Assets:  Desire for Improvement Resilience  ADL's:  Intact  Cognition:  WNL  Sleep:  Number of Hours: 6    Assessment - Patient continues to improve gradually but significantly compared to his admission presentation - mood is improving , affect is more reactive, he is less avoidant and more visible in day room, grooming is improved, hallucinations have resolved. At this time treatment plan continues to be for patient to go to a Group Home on discharge. Thus far tolerating medications well .   Treatment Plan Summary: Daily contact with patient to  assess and evaluate symptoms and progress in treatment and Medication management   Encourage group and milieu participation to work on coping skills and symptom reduction  Continue  Abilify  10 mgrs QDAY to for  mood and psychotic symptoms Continue   Effexor XR  225 mgrs  QAM for ongoing depression  Continue Trazodone  100 mgrs QHS PRN for insomnia  Continue Vistaril PRNs for anxiety as needed  Continue DM management  Treatment team working on disposition options -  Patient is requesting   being referred for group home placement:  [place] working on PASSR/ other forms needed for this disposition process .   Nanci Pina, FNP [facility] 02/15/2016, 1:19 PM

## 2016-02-15 NOTE — [facility] Group Notes (Signed)
[facility] LCSW Group Note  Group session was about Understanding Change and Initiating Change.  Participation - Present.  Began the discussion by having group participants identify Changes they can Control and then Changes they Can't Control. Then revisited with the group the list of Changes that can be Controlled to identify if the Changes are Hard or Easy to control. Particularly focused the idea of Changing on Thinking, Personality and Actions which all participants identified as Hard to change.   1- Gained understanding of the inevitability of Change. 2- Assessed each Change individually. 3- Understand the importance of change or Rewards of change. 4- Identifying one small step toward big Change.  Patient progress - Patient very observant during group and spoke very breifly but was able tot encourage group by identifying the outcomes of change when you commit to them.   Beverly Sessions MSW, LCSW

## 2016-02-15 NOTE — [facility] Group Notes (Signed)
[facility] Group Notes:  (Nursing/MHT/Case Management/Adjunct)  Date:  02/15/2016  Time:  10:45 AM  Type of Therapy:  Nurse Education  Participation Level:  Active  Participation Quality:  Appropriate  Affect:  Appropriate  Cognitive:  Alert and Appropriate  Insight:  Appropriate  Engagement in Group:  Engaged  Modes of Intervention:  Problem-solving  Summary of Progress/Problems: Patient was able to state his goal for today as: To be a little happier. He was able to identify three positive statements about himself, that he will use throughout the day.  Almira Bar 02/15/2016, 10:45 AM

## 2016-02-16 LAB — GLUCOSE, CAPILLARY
GLUCOSE-CAPILLARY: 176 mg/dL — AB (ref 65–99)
GLUCOSE-CAPILLARY: 258 mg/dL — AB (ref 65–99)
GLUCOSE-CAPILLARY: 190 mg/dL — AB (ref 65–99)
Glucose-Capillary: 334 mg/dL — ABNORMAL HIGH (ref 65–99)

## 2016-02-16 NOTE — Plan of Care (Signed)
Problem: Education: Goal: Knowledge of the prescribed therapeutic regimen will improve Outcome: Progressing Patient has taken his medications as scheduled, with minimal prompting.

## 2016-02-16 NOTE — Progress Notes (Signed)
D: Revel states he had a good day. Denies SI/HI/AVH at this time. Contracts for safety.  A: Encouragement and support given. Q15 minute room checks for patient safety. Medications administered as prescribed.  R: Continue to monitor for patient safety and medication effectiveness.

## 2016-02-16 NOTE — Progress Notes (Signed)
[facility] Group Notes:  (Nursing/MHT/Case Management/Adjunct)  Date:  02/16/2016  Time:  12:59 AM  Type of Therapy:  Psychoeducational Skills  Participation Level:  Minimal  Participation Quality:  Attentive  Affect:  Flat  Cognitive:  Lacking  Insight:  Lacking  Engagement in Group:  Lacking  Modes of Intervention:  Education  Summary of Progress/Problems: The patient had little to share with the group except to say that he had a pretty good day. His coping skill (theme of the day) will be to "draw".   Hazle Coca S 02/16/2016, 12:59 AM

## 2016-02-16 NOTE — Progress Notes (Signed)
[facility] MD Progress Note  02/16/2016 11:36 AM Tony Cunningham  MRN:  [id number] Subjective:  I'm alright today. I forgot what we talked about today in group. "change." But I did participate and I like that group. I learned some more coping skills of video  Games, reading, and walking. I wanted to know what my diagnosis was? I am on Abilify and they say that it is for Bipolar.   Objective:  I have discussed case with treatment team and have met with patient. AM nurse reports patient is experiencing tremors and would like some Cogentin. No other somatic complaints noted at this time.  Patient continues to present with brighter affect and mood has improved, he is very engaging and more talkative today. He acknowledges Probation officer at this time, and greets appropriately although conversation and thought process has improved.  Seen attending groups, and his goal today is "I statements." He does report having some new coping skills of video games, walking, and reading.  Denies medication side effects. No disruptive or agitated behaviors on unit, behavior calm and in control. Denies any hallucinations today and does not appear internally preoccupied. Discussed with patient about his concerns, he denies any complaints at this time. He is concerned about his diagnosis however, and the medication he is taking is used for something else. Patient advised that we will refer him to psychologist at [facility] who will be able to test him for certain illnesses. He is advised that Abilify can be used and is used as a buffer to help with depression medication, as well as Bipolar.   Principal Problem: MDD (major depressive disorder), recurrent, severe, with psychosis ([place]) Diagnosis:   Patient Active Problem List   Diagnosis Date Noted  . MDD (major depressive disorder), recurrent, severe, with psychosis ([place]) [F33.3] 02/07/2016  . Sinus complaint [R09.89] 12/25/2010  . Diabetes mellitus [250] 12/25/2010  . Depression [F32.9] 12/25/2010   . Anxiety [F41.9] 12/25/2010  . High blood pressure [I10] 12/25/2010   Total Time spent with patient: 20 minutes   Past Psychiatric History: see HPI  Past Medical History:  Past Medical History:  Diagnosis Date  . Anxiety   . Asthma   . Diabetes mellitus   . High blood pressure   . Sinus complaint    History reviewed. No pertinent surgical history. Family History: History reviewed. No pertinent family history. Family Psychiatric  History: see HPI Social History:  History  Alcohol Use No     History  Drug Use No    Social History   Social History  . Marital status: Single    Spouse name: [name]  . Number of children: [name]  . Years of education: [name]   Social History Main Topics  . Smoking status: Current Every Day Smoker    Packs/day: 0.50    Types: Cigarettes  . Smokeless tobacco: Never Used  . Alcohol use No  . Drug use: No  . Sexual activity: Not Currently   Other Topics Concern  . None   Social History Narrative  . None   Additional Social History:    Pain Medications: Ibuprofen Prescriptions: see MAR Over the Counter: see MAR History of alcohol / drug use?: No history of alcohol / drug abuse      Sleep:   Good   Appetite:  Good  Current Medications: Current Facility-Administered Medications  Medication Dose Route Frequency Provider Last Rate Last Dose  . acetaminophen (TYLENOL) tablet 650 mg  650 mg Oral Q6H PRN Nanci Pina,  FNP   650 mg at 02/14/16 2208  . alum & mag hydroxide-simeth (MAALOX/MYLANTA) 200-200-20 MG/5ML suspension 30 mL  30 mL Oral Q4H PRN Nanci Pina, FNP      . ARIPiprazole (ABILIFY) tablet 10 mg  10 mg Oral Daily Jenne Campus, MD   10 mg at 02/16/16 0852  . canagliflozin (INVOKANA) tablet 300 mg  300 mg Oral QAC breakfast Lurena Nida, NP   300 mg at 02/16/16 0615  . diclofenac sodium (VOLTAREN) 1 % transdermal gel 2 g  2 g Topical BID Kerrie Buffalo, NP   2 g at 02/15/16 1717  . lisinopril (PRINIVIL,ZESTRIL) tablet  20 mg  20 mg Oral Daily Ursula Alert, MD   20 mg at 02/15/16 0824   And  . hydrochlorothiazide (HYDRODIURIL) tablet 25 mg  25 mg Oral Daily Saramma Eappen, MD   25 mg at 02/16/16 0852  . hydrOXYzine (ATARAX/VISTARIL) tablet 25 mg  25 mg Oral TID PRN Nanci Pina, FNP   25 mg at 02/15/16 1723  . Influenza vac split quadrivalent PF (FLUARIX) injection 0.5 mL  0.5 mL Intramuscular Tomorrow-1000 Saramma Eappen, MD      . insulin aspart (novoLOG) injection 0-20 Units  0-20 Units Subcutaneous TID WC Lurena Nida, NP   4 Units at 02/16/16 [phone]  . insulin aspart (novoLOG) injection 0-5 Units  0-5 Units Subcutaneous QHS Lurena Nida, NP   3 Units at 02/15/16 2145  . insulin aspart (novoLOG) injection 8 Units  8 Units Subcutaneous TID WC Kerrie Buffalo, NP   8 Units at 02/16/16 [phone]  . insulin glargine (LANTUS) injection 20 Units  20 Units Subcutaneous QHS Kerrie Buffalo, NP   20 Units at 02/15/16 2146  . magnesium hydroxide (MILK OF MAGNESIA) suspension 30 mL  30 mL Oral Daily PRN Nanci Pina, FNP      . metFORMIN (GLUCOPHAGE) tablet 500 mg  500 mg Oral BID WC Lurena Nida, NP   500 mg at 02/16/16 0852  . montelukast (SINGULAIR) tablet 10 mg  10 mg Oral QHS Lurena Nida, NP   10 mg at 02/15/16 2142  . nicotine (NICODERM CQ - dosed in mg/24 hours) patch 21 mg  21 mg Transdermal Daily Ursula Alert, MD   21 mg at 02/16/16 0851  . pregabalin (LYRICA) capsule 100 mg  100 mg Oral TID Lurena Nida, NP   100 mg at 02/16/16 0851  . simvastatin (ZOCOR) tablet 40 mg  40 mg Oral QHS Lurena Nida, NP   40 mg at 02/15/16 2142  . traZODone (DESYREL) tablet 100 mg  100 mg Oral QHS PRN Jenne Campus, MD   100 mg at 02/15/16 2232  . venlafaxine XR (EFFEXOR-XR) 24 hr capsule 225 mg  225 mg Oral Q breakfast Jenne Campus, MD   225 mg at 02/16/16 4128    Lab Results:  Results for orders placed or performed during the hospital encounter of 02/07/16 (from the past 48 hour(s))  Glucose, capillary      Status: Abnormal   Collection Time: 02/14/16 12:14 PM  Result Value Ref Range   Glucose-Capillary 165 (H) 65 - 99 mg/dL  Glucose, capillary     Status: Abnormal   Collection Time: 02/14/16  4:47 PM  Result Value Ref Range   Glucose-Capillary 239 (H) 65 - 99 mg/dL  Glucose, capillary     Status: Abnormal   Collection Time: 02/14/16  8:35 PM  Result Value Ref  Range   Glucose-Capillary 249 (H) 65 - 99 mg/dL   Comment 1 Notify RN   Glucose, capillary     Status: Abnormal   Collection Time: 02/15/16  5:41 AM  Result Value Ref Range   Glucose-Capillary 220 (H) 65 - 99 mg/dL   Comment 1 Notify RN   Glucose, capillary     Status: Abnormal   Collection Time: 02/15/16 11:48 AM  Result Value Ref Range   Glucose-Capillary 166 (H) 65 - 99 mg/dL   Comment 1 Notify RN    Comment 2 Document in Chart   Glucose, capillary     Status: Abnormal   Collection Time: 02/15/16  4:57 PM  Result Value Ref Range   Glucose-Capillary 293 (H) 65 - 99 mg/dL  Glucose, capillary     Status: Abnormal   Collection Time: 02/15/16  8:41 PM  Result Value Ref Range   Glucose-Capillary 275 (H) 65 - 99 mg/dL   Comment 1 Notify RN   Glucose, capillary     Status: Abnormal   Collection Time: 02/16/16  5:37 AM  Result Value Ref Range   Glucose-Capillary 176 (H) 65 - 99 mg/dL   Comment 1 Notify RN     Blood Alcohol level:  No results found for: [facility]  Metabolic Disorder Labs: Lab Results  Component Value Date   HGBA1C 10.0 (H) 02/09/2016   MPG 240 02/09/2016   Lab Results  Component Value Date   PROLACTIN 3.0 (L) 02/08/2016   Lab Results  Component Value Date   CHOL 197 02/08/2016   TRIG 269 (H) 02/08/2016   HDL 44 02/08/2016   CHOLHDL 4.5 02/08/2016   VLDL 54 (H) 02/08/2016   LDLCALC 99 02/08/2016    Physical Findings: AIMS: Facial and Oral Movements Muscles of Facial Expression: None, normal Lips and Perioral Area: None, normal Jaw: None, normal Tongue: None, normal,Extremity Movements Upper  (arms, wrists, hands, fingers): Minimal Lower (legs, knees, ankles, toes): None, normal, Trunk Movements Neck, shoulders, hips: None, normal, Overall Severity Severity of abnormal movements (highest score from questions above): Minimal Incapacitation due to abnormal movements: None, normal Patient's awareness of abnormal movements (rate only patient's report): Aware, mild distress, Dental Status Current problems with teeth and/or dentures?: No Does patient usually wear dentures?: No  CIWA:    COWS:     Musculoskeletal: Strength & Muscle Tone: within normal limits Gait & Station: normal Patient leans: [name]  Psychiatric Specialty Exam: Physical Exam  Nursing note and vitals reviewed.   ROS  denies headache, denies chest pain, no shortness of breath, describes neck and lower back pain, discomfort   Blood pressure 93/69, pulse 78, temperature 99.1 F (37.3 C), resp. rate 16, height 6' (1.829 m), weight 84.8 kg (187 lb), SpO2 98 %.Body mass index is 25.36 kg/m.  General Appearance:  Grooming has tended to improve   Eye Contact:  Good  Speech:  Normal Rate  Volume:  Normal  Mood:   Euthymic, brightens upon approach  Affect:   improving, more reactive congruent  Thought Process:  Goal Directed  Orientation:  Full (Time, Place, and Person)  Thought Content:   he has had no hallucinations today- does not appear internally preoccupied   Suicidal Thoughts:  At this time denies any plan or intention of hurting self or of suicide and contracts for safety on unit   Homicidal Thoughts:  No denies any violent or homicidal ideations   Memory:  Immediate;   Good Recent;   Good Remote;  Good  Judgement:  Improving   Insight:   Improving   Psychomotor Activity:  Normal  Concentration:  Concentration: Good and Attention Span: Good  Recall:  Good  Fund of Knowledge:  Good  Language:  Good  Akathisia:  Negative  Handed:  Right  AIMS (if indicated):     Assets:  Desire for  Improvement Resilience  ADL's:  Intact  Cognition:  WNL  Sleep:  Number of Hours: 5.5    Assessment - Patient continues to improve gradually but significantly compared to his admission presentation - mood is improving , affect is more reactive, he is less avoidant and more visible in day room, grooming is improved, hallucinations have resolved. At this time treatment plan continues to be for patient to go to a Group Home on discharge. Thus far tolerating medications well .   Treatment Plan Summary: Daily contact with patient to assess and evaluate symptoms and progress in treatment and Medication management   Encourage group and milieu participation to work on coping skills and symptom reduction  Continue  Abilify  10 mgrs QDAY to for  mood and psychotic symptoms Continue   Effexor XR  225 mgrs QAM for ongoing depression  Continue Trazodone  100 mgrs QHS PRN for insomnia  Continue Vistaril PRNs for anxiety as needed  Continue DM management  Treatment team working on disposition options -  Patient is requesting   being referred for group home placement:  [place] working on PASSR/ other forms needed for this disposition process .   Nanci Pina, FNP [facility] 02/16/2016, 11:36 AM

## 2016-02-16 NOTE — Progress Notes (Signed)
Data. Patient denies SI/HI/AVH. Affect continues to be blunt, though he will smile with interaction. Patient having lags in responcess to questions and often answers inappropriately and then asks for the question to be repeated, then answers appropriately. Appears to be responding to internal stimulation and to be paranoid.   Patient interacting only minimally with staff and patients. He will sit in the day room, with others, but does not initiate interaction. He has spent much of shift in his room. On his self assessment patient reports 4/10 for depression, 8/10 for hopelessness and 6/10 for anxiety. Patient does not have a goal today. Action. Emotional support and encouragement offered. Education provided on medication, indications and side effect. Q 15 minute checks done for safety. Response. Safety on the unit maintained through 15 minute checks.  Medications taken as prescribed. Attended groups. Remained calm and appropriate through out shift.

## 2016-02-16 NOTE — [facility] Group Notes (Signed)
[facility] LCSW Group Therapy  02/16/2016 10:00 AM  Type of Therapy:  Group Therapy  Participation Level:  None  Participation Quality:  Appropriate  Affect:  Appropriate  Cognitive:  Alert and Oriented  Insight:  None  Engagement in Therapy:  Lacking  Modes of Intervention:  Discussion  Summary of Progress/Problems: Group today was about appropriate goal setting beyond treatment. Patients began by visualizing Goals for themselves and then thinking about barriers for those goals. Patients were then asked to identify 1 small thing they can do in order to work on that barrier. Almost all of the group participants could identify an appropriate, manageable short term goal and most of them identified that they had never processed their goals into smaller pieces. Group went on to discuss how to create smaller goals to address larger issues and process the difference between excuses and problems.  Beverly Sessions 02/16/2016, 5:06 PM

## 2016-02-16 NOTE — Plan of Care (Signed)
Problem: Health Behavior/Discharge Planning: Goal: Compliance with prescribed medication regimen will improve Outcome: Progressing Pt has been compliant with scheduled medications tonight.    

## 2016-02-16 NOTE — Progress Notes (Signed)
D: Pt was in the day room upon initial approach.  Pt has depressed affect and mood.  He reports he had a "rough" day.  Pt reports "I can't concentrate right."  Pt is slow to respond to some questions asked by Clinical research associate.  Pt denies SI/HI, denies hallucinations, denies pain.  Pt has been visible in milieu interacting with peers and staff appropriately.  Pt attended evening group.   A: Introduced self to pt.  Actively listened to pt and offered support and encouragement.  Medication administered per order.  PRN medication administered for sleep. R: Pt is compliant with medications.  Pt verbally contracts for safety.  Will continue to monitor and assess.

## 2016-02-16 NOTE — [facility] Group Notes (Signed)
[facility] Group Notes:  (Nursing/MHT/Case Management/Adjunct)  Date:  02/16/2016  Time:  4:35 PM  Type of Therapy:  Psychoeducational Skills  Participation Level:  Minimal  Participation Quality:  Appropriate  Affect:  Depressed  Cognitive:  Appropriate  Insight:  Limited  Engagement in Group:  Limited  Modes of Intervention:  Discussion and Education  Summary of Progress/Problems: Patient attended group. He was unable to think of a goal at time of group.   Marzetta Board E 02/16/2016, 4:35 PM

## 2016-02-17 DIAGNOSIS — Z794 Long term (current) use of insulin: Secondary | ICD-10-CM

## 2016-02-17 DIAGNOSIS — Z79899 Other long term (current) drug therapy: Secondary | ICD-10-CM

## 2016-02-17 DIAGNOSIS — F17210 Nicotine dependence, cigarettes, uncomplicated: Secondary | ICD-10-CM

## 2016-02-17 LAB — GLUCOSE, CAPILLARY
GLUCOSE-CAPILLARY: 151 mg/dL — AB (ref 65–99)
GLUCOSE-CAPILLARY: 241 mg/dL — AB (ref 65–99)
Glucose-Capillary: 256 mg/dL — ABNORMAL HIGH (ref 65–99)
Glucose-Capillary: 241 mg/dL — ABNORMAL HIGH (ref 65–99)

## 2016-02-17 MED ORDER — BENZTROPINE MESYLATE 0.5 MG PO TABS
0.5000 mg | ORAL_TABLET | Freq: Two times a day (BID) | ORAL | Status: DC | PRN
  Administered 2016-02-17 – 2016-02-18 (×3): 0.5 mg via ORAL
  Filled 2016-02-17 (×3): qty 1

## 2016-02-17 MED ORDER — INSULIN ASPART 100 UNIT/ML ~~LOC~~ SOLN
10.0000 [IU] | Freq: Three times a day (TID) | SUBCUTANEOUS | Status: DC
  Administered 2016-02-17 – 2016-02-20 (×10): 10 [IU] via SUBCUTANEOUS

## 2016-02-17 MED ORDER — INSULIN GLARGINE 100 UNIT/ML ~~LOC~~ SOLN
22.0000 [IU] | Freq: Every day | SUBCUTANEOUS | Status: DC
  Administered 2016-02-17 – 2016-02-20 (×4): 22 [IU] via SUBCUTANEOUS

## 2016-02-17 MED ORDER — VENLAFAXINE HCL ER 37.5 MG PO CP24
187.5000 mg | ORAL_CAPSULE | Freq: Every day | ORAL | Status: DC
  Administered 2016-02-18 – 2016-02-21 (×4): 187.5 mg via ORAL
  Filled 2016-02-17 (×5): qty 1

## 2016-02-17 NOTE — Progress Notes (Signed)
Inpatient Diabetes Program Recommendations  AACE/ADA: New Consensus Statement on Inpatient Glycemic Control (2015)  Target Ranges:  Prepandial:   less than 140 mg/dL      Peak postprandial:   less than 180 mg/dL (1-2 hours)      Critically ill patients:  140 - 180 mg/dL   Results for Tony Cunningham, WINDERS (MRN [id number]) as of 02/17/2016 09:27  Ref. Range 02/16/2016 05:37 02/16/2016 11:33 02/16/2016 16:53 02/16/2016 20:42  Glucose-Capillary Latest Ref Range: 65 - 99 mg/dL 159 (H) 458 (H) 592 (H) 190 (H)    Current Insulin Orders: Lantus 20 units QHS      Novolog Resistant Correction Scale/ SSI (0-20 units) TID AC + HS      Novolog 8 units tidwc      Metformin 500 mg bid      Invokana 300 mg daily     MD- Please consider the following in-hospital insulin adjustments:  1. Increase Lantus slightly to 22 units QHS  2. Increase Novolog Meal Coverage to 10 units tid with meals (hold if pt eats <50% of meal)      --Will follow patient during hospitalization--  Ambrose Finland RN, MSN, CDE Diabetes Coordinator Inpatient Glycemic Control Team Team Pager: [phone] (8a-5p)

## 2016-02-17 NOTE — Progress Notes (Signed)
Adult Psychoeducational Group Note  Date:  02/16/2016 Time:  2135  Group Topic/Focus:  Wrap-Up Group:   The focus of this group is to help patients review their daily goal of treatment and discuss progress on daily workbooks.   Participation Level:  Active  Participation Quality:  Appropriate  Affect:  Appropriate  Cognitive:  Alert, Appropriate and Oriented  Insight: Appropriate  Engagement in Group:  Engaged  Modes of Intervention:  Discussion  Additional Comments:  Patient attended group and said that his day was a 8.  He did not have any goals for today, but his coping skill was socializing with others.   Tony Cunningham Tony Cunningham 02/17/2016, 12:55 AM

## 2016-02-17 NOTE — Progress Notes (Signed)
Adult Psychoeducational Group Note  Date:  02/17/2016 Time:  10:28 PM  Group Topic/Focus:  Wrap-Up Group:   The focus of this group is to help patients review their daily goal of treatment and discuss progress on daily workbooks.   Participation Level:  Active  Participation Quality:  Appropriate  Affect:  Appropriate  Cognitive:  Alert  Insight: Appropriate  Engagement in Group:  Engaged  Modes of Intervention:  Discussion  Additional Comments:  Patient states, "I had a very good day". Patient's goal for today was to stay awake. Patient met goal.  Tajai Ihde L Rogena Deupree 02/17/2016, 10:28 PM

## 2016-02-17 NOTE — Progress Notes (Signed)
D: Patient up and visible in the milieu. Spoke with patient 1:1. Rates sleep good, appetite good, energy normal and concentration good. Patient's affect flat, anxious and preoccupied with depressed and anxious mood. Rating his depression at a 4/10, hopelessness at a 310 and anxiety at an 8/10. States goal for today is to "stay awake and stay focused." Complains of chronic neck pain of a 7/10, no other physical problems.   A: Medicated per orders, tylenol given prn for pain. Emotional support offered and self inventory reviewed.   R: On reassess, patient's pain is a 4/10. Patient denies SI/HI and remains safe on level III obs.

## 2016-02-17 NOTE — [facility] Group Notes (Signed)
[facility] LCSW Group Therapy  02/17/2016 1:15pm  Type of Therapy:  Group Therapy vercoming Obstacles  Participation Level:  Reserved  Participation Quality:  Attentive  Affect:  Appropriate  Cognitive:  Appropriate and Oriented  Insight:  Limited  Engagement in Therapy:  Improving  Modes of Intervention:  Discussion, Exploration, Problem-solving and Support  Description of Group:   In this group patients will be encouraged to explore what they see as obstacles to their own wellness and recovery. They will be guided to discuss their thoughts, feelings, and behaviors related to these obstacles. The group will process together ways to cope with barriers, with attention given to specific choices patients can make. Each patient will be challenged to identify changes they are motivated to make in order to overcome their obstacles. This group will be process-oriented, with patients participating in exploration of their own experiences as well as giving and receiving support and challenge from other group members.  Summary of Patient Progress: Pt identified conflict with his mother as a primary obstacle that he is facing. Pt was attentive to conversation of peers and was engaged; he offered affirmative gestures as well.    Therapeutic Modalities:   Cognitive Behavioral Therapy Solution Focused Therapy Motivational Interviewing Relapse Prevention Therapy   Chad Cordial, LCSWA 02/17/2016 4:25 PM

## 2016-02-17 NOTE — Progress Notes (Signed)
Patient ID: Tony Cunningham, male   DOB: 05-27-1975, 40 y.o.   MRN: [id number] PER STATE REGULATIONS 482.30  THIS CHART WAS REVIEWED FOR MEDICAL NECESSITY WITH RESPECT TO THE PATIENT'S ADMISSION/ DURATION OF STAY.  NEXT REVIEW DATE: 02/19/2016  Willa Rough, RN, BSN CASE MANAGER

## 2016-02-17 NOTE — Progress Notes (Signed)
[facility] MD Progress Note  02/17/2016 11:53 AM Tony Cunningham  MRN:  [id number] Subjective:  Patient reporting feelinI'm alright today but complaining of upper extremity tremors which is quite visible to this writer when patient stretches his upper extremities out. Venlafaxine has been increased from zero to 225 mg over the past 10 days and patient is currently on 80m abilify. Patient also complains of his heart pounding but heart rate this this morning 83.     Objective:  I have discussed case with treatment team and have met with patient. AM nurse reports patient is experiencing tremors and would like to have something for. No other somatic complaints noted at this time.    Principal Problem: MDD (major depressive disorder), recurrent, severe, with psychosis ([place] Diagnosis:   Patient Active Problem List   Diagnosis Date Noted  . MDD (major depressive disorder), recurrent, severe, with psychosis ([place] [F33.3] 02/07/2016  . Sinus complaint [R09.89] 12/25/2010  . Diabetes mellitus [250] 12/25/2010  . Depression [F32.9] 12/25/2010  . Anxiety [F41.9] 12/25/2010  . High blood pressure [I10] 12/25/2010   Total Time spent with patient: 30 minutes   Past Psychiatric History: see HPI  Past Medical History:  Past Medical History:  Diagnosis Date  . Anxiety   . Asthma   . Diabetes mellitus   . High blood pressure   . Sinus complaint    History reviewed. No pertinent surgical history. Family History: History reviewed. No pertinent family history. Family Psychiatric  History: see HPI Social History:  History  Alcohol Use No     History  Drug Use No    Social History   Social History  . Marital status: Single    Spouse name: [name]  . Number of children: [name]  . Years of education: [name]   Social History Main Topics  . Smoking status: Current Every Day Smoker    Packs/day: 0.50    Types: Cigarettes  . Smokeless tobacco: Never Used  . Alcohol use No  . Drug use: No  . Sexual activity:  Not Currently   Other Topics Concern  . None   Social History Narrative  . None   Additional Social History:    Pain Medications: Ibuprofen Prescriptions: see MAR Over the Counter: see MAR History of alcohol / drug use?: No history of alcohol / drug abuse      Sleep:   Good   Appetite:  Good  Current Medications: Current Facility-Administered Medications  Medication Dose Route Frequency Provider Last Rate Last Dose  . acetaminophen (TYLENOL) tablet 650 mg  650 mg Oral Q6H PRN TNanci Pina FNP   650 mg at 02/17/16 0810  . alum & mag hydroxide-simeth (MAALOX/MYLANTA) 200-200-20 MG/5ML suspension 30 mL  30 mL Oral Q4H PRN TNanci Pina FNP      . ARIPiprazole (ABILIFY) tablet 10 mg  10 mg Oral Daily FJenne Campus MD   10 mg at 02/17/16 0809  . canagliflozin (INVOKANA) tablet 300 mg  300 mg Oral QAC breakfast FLurena Nida NP   300 mg at 02/17/16 0559  . diclofenac sodium (VOLTAREN) 1 % transdermal gel 2 g  2 g Topical BID SKerrie Buffalo NP   2 g at 02/17/16 0813  . lisinopril (PRINIVIL,ZESTRIL) tablet 20 mg  20 mg Oral Daily Saramma Eappen, MD   20 mg at 02/17/16 0809   And  . hydrochlorothiazide (HYDRODIURIL) tablet 25 mg  25 mg Oral Daily SUrsula Alert MD   25 mg at 02/17/16  0800  . hydrOXYzine (ATARAX/VISTARIL) tablet 25 mg  25 mg Oral TID PRN Nanci Pina, FNP   25 mg at 02/16/16 1718  . Influenza vac split quadrivalent PF (FLUARIX) injection 0.5 mL  0.5 mL Intramuscular Tomorrow-1000 Saramma Eappen, MD      . insulin aspart (novoLOG) injection 0-20 Units  0-20 Units Subcutaneous TID WC Lurena Nida, NP   4 Units at 02/17/16 0601  . insulin aspart (novoLOG) injection 0-5 Units  0-5 Units Subcutaneous QHS Lurena Nida, NP   3 Units at 02/15/16 2145  . insulin aspart (novoLOG) injection 8 Units  8 Units Subcutaneous TID WC Kerrie Buffalo, NP   8 Units at 02/17/16 0600  . insulin glargine (LANTUS) injection 20 Units  20 Units Subcutaneous QHS Kerrie Buffalo, NP    20 Units at 02/16/16 2207  . magnesium hydroxide (MILK OF MAGNESIA) suspension 30 mL  30 mL Oral Daily PRN Nanci Pina, FNP      . metFORMIN (GLUCOPHAGE) tablet 500 mg  500 mg Oral BID WC Lurena Nida, NP   500 mg at 02/17/16 0809  . montelukast (SINGULAIR) tablet 10 mg  10 mg Oral QHS Lurena Nida, NP   10 mg at 02/16/16 2208  . nicotine (NICODERM CQ - dosed in mg/24 hours) patch 21 mg  21 mg Transdermal Daily Ursula Alert, MD   21 mg at 02/17/16 0809  . pregabalin (LYRICA) capsule 100 mg  100 mg Oral TID Lurena Nida, NP   100 mg at 02/17/16 0809  . simvastatin (ZOCOR) tablet 40 mg  40 mg Oral QHS Lurena Nida, NP   40 mg at 02/16/16 2208  . traZODone (DESYREL) tablet 100 mg  100 mg Oral QHS PRN Jenne Campus, MD   100 mg at 02/16/16 2208  . venlafaxine XR (EFFEXOR-XR) 24 hr capsule 225 mg  225 mg Oral Q breakfast Jenne Campus, MD   225 mg at 02/17/16 0809    Lab Results:  Results for orders placed or performed during the hospital encounter of 02/07/16 (from the past 48 hour(s))  Glucose, capillary     Status: Abnormal   Collection Time: 02/15/16  4:57 PM  Result Value Ref Range   Glucose-Capillary 293 (H) 65 - 99 mg/dL  Glucose, capillary     Status: Abnormal   Collection Time: 02/15/16  8:41 PM  Result Value Ref Range   Glucose-Capillary 275 (H) 65 - 99 mg/dL   Comment 1 Notify RN   Glucose, capillary     Status: Abnormal   Collection Time: 02/16/16  5:37 AM  Result Value Ref Range   Glucose-Capillary 176 (H) 65 - 99 mg/dL   Comment 1 Notify RN   Glucose, capillary     Status: Abnormal   Collection Time: 02/16/16 11:33 AM  Result Value Ref Range   Glucose-Capillary 334 (H) 65 - 99 mg/dL  Glucose, capillary     Status: Abnormal   Collection Time: 02/16/16  4:53 PM  Result Value Ref Range   Glucose-Capillary 258 (H) 65 - 99 mg/dL  Glucose, capillary     Status: Abnormal   Collection Time: 02/16/16  8:42 PM  Result Value Ref Range   Glucose-Capillary 190 (H)  65 - 99 mg/dL   Comment 1 Notify RN   Glucose, capillary     Status: Abnormal   Collection Time: 02/17/16  5:26 AM  Result Value Ref Range   Glucose-Capillary 151 (H) 65 -  99 mg/dL   Comment 1 Notify RN     Blood Alcohol level:  No results found for: [facility]  Metabolic Disorder Labs: Lab Results  Component Value Date   HGBA1C 10.0 (H) 02/09/2016   MPG 240 02/09/2016   Lab Results  Component Value Date   PROLACTIN 3.0 (L) 02/08/2016   Lab Results  Component Value Date   CHOL 197 02/08/2016   TRIG 269 (H) 02/08/2016   HDL 44 02/08/2016   CHOLHDL 4.5 02/08/2016   VLDL 54 (H) 02/08/2016   LDLCALC 99 02/08/2016    Physical Findings: AIMS: Facial and Oral Movements Muscles of Facial Expression: None, normal Lips and Perioral Area: None, normal Jaw: None, normal Tongue: None, normal,Extremity Movements Upper (arms, wrists, hands, fingers): Minimal Lower (legs, knees, ankles, toes): None, normal, Trunk Movements Neck, shoulders, hips: None, normal, Overall Severity Severity of abnormal movements (highest score from questions above): Minimal Incapacitation due to abnormal movements: None, normal Patient's awareness of abnormal movements (rate only patient's report): Aware, mild distress, Dental Status Current problems with teeth and/or dentures?: No Does patient usually wear dentures?: No  CIWA:    COWS:     Musculoskeletal: Strength & Muscle Tone: within normal limits Gait & Station: normal Patient leans: [name]  Psychiatric Specialty Exam: Physical Exam  Nursing note and vitals reviewed.   ROS denies headache, denies chest pain, no shortness of breath, describes neck and lower back pain, discomfort   Blood pressure 116/79, pulse 83, temperature 97.1 F (36.2 C), temperature source Oral, resp. rate 18, height 6' (1.829 m), weight 84.8 kg (187 lb), SpO2 98 %.Body mass index is 25.36 kg/m.  General Appearance:  Grooming has tended to improve   Eye Contact:  Good   Speech:  Normal Rate  Volume:  Normal  Mood:   Euthymic, brightens upon approach  Affect:   improving, more reactive congruent  Thought Process:  Goal Directed  Orientation:  Full (Time, Place, and Person)  Thought Content:   he has had no hallucinations today- does not appear internally preoccupied   Suicidal Thoughts:  At this time denies any plan or intention of hurting self or of suicide and contracts for safety on unit   Homicidal Thoughts:  No denies any violent or homicidal ideations   Memory:  Immediate;   Good Recent;   Good Remote;   Good  Judgement:  Improving   Insight:   Improving   Psychomotor Activity:  Normal  Concentration:  Concentration: Good and Attention Span: Good  Recall:  Good  Fund of Knowledge:  Good  Language:  Good  Akathisia:  Negative  Handed:  Right  AIMS (if indicated):     Assets:  Desire for Improvement Resilience  ADL's:  Intact  Cognition:  WNL  Sleep:  Number of Hours: 5.75    Assessment - Patient continues to improve gradually but significantly compared to his admission presentation - mood is improving , affect is more reactive, he is less avoidant and more visible in day room, grooming is improved, hallucinations have resolved. At this time treatment plan continues to be for patient to go to a Group Home on discharge. Thus far tolerating medications well .   Treatment Plan Summary: Daily contact with patient to assess and evaluate symptoms and progress in treatment and Medication management  Decrease venlafaxine to 187.5 daily Start cogentin .26m bid Encourage group and milieu participation to work on coping skills and symptom reduction  Continue  Abilify  10 mgrs QDAY  to for  mood and psychotic symptoms Continue   Effexor XR  225 mgrs QAM for ongoing depression  Continue Trazodone  100 mgrs QHS PRN for insomnia  Continue Vistaril PRNs for anxiety as needed  Continue DM management  Treatment team working on disposition options -   Patient is requesting   being referred for group home placement:  CSW working on PASSR/ other forms needed for this disposition process .   Ruffin Frederick, MD [facility] 02/17/2016, 11:53 AM

## 2016-02-17 NOTE — Tx Team (Signed)
Interdisciplinary Treatment and Diagnostic Plan Update **Late entry from 9/22** 02/17/2016 Time of Session: 8:54 AM  Tony Cunningham MRN: [id number]  Principal Diagnosis: MDD (major depressive disorder), recurrent, severe, with psychosis (HCC)  Secondary Diagnoses: Principal Problem:   MDD (major depressive disorder), recurrent, severe, with psychosis (HCC)   Current Medications:  Current Facility-Administered Medications  Medication Dose Route Frequency Provider Last Rate Last Dose  . acetaminophen (TYLENOL) tablet 650 mg  650 mg Oral Q6H PRN Truman Hayward, FNP   650 mg at 02/17/16 0810  . alum & mag hydroxide-simeth (MAALOX/MYLANTA) 200-200-20 MG/5ML suspension 30 mL  30 mL Oral Q4H PRN Truman Hayward, FNP      . ARIPiprazole (ABILIFY) tablet 10 mg  10 mg Oral Daily Craige Cotta, MD   10 mg at 02/17/16 0809  . canagliflozin (INVOKANA) tablet 300 mg  300 mg Oral QAC breakfast Kristeen Mans, NP   300 mg at 02/17/16 0559  . diclofenac sodium (VOLTAREN) 1 % transdermal gel 2 g  2 g Topical BID Adonis Brook, NP   2 g at 02/17/16 0813  . lisinopril (PRINIVIL,ZESTRIL) tablet 20 mg  20 mg Oral Daily Saramma Eappen, MD   20 mg at 02/17/16 0809   And  . hydrochlorothiazide (HYDRODIURIL) tablet 25 mg  25 mg Oral Daily Saramma Eappen, MD   25 mg at 02/17/16 0800  . hydrOXYzine (ATARAX/VISTARIL) tablet 25 mg  25 mg Oral TID PRN Truman Hayward, FNP   25 mg at 02/16/16 1718  . Influenza vac split quadrivalent PF (FLUARIX) injection 0.5 mL  0.5 mL Intramuscular Tomorrow-1000 Saramma Eappen, MD      . insulin aspart (novoLOG) injection 0-20 Units  0-20 Units Subcutaneous TID WC Kristeen Mans, NP   4 Units at 02/17/16 0601  . insulin aspart (novoLOG) injection 0-5 Units  0-5 Units Subcutaneous QHS Kristeen Mans, NP   3 Units at 02/15/16 2145  . insulin aspart (novoLOG) injection 8 Units  8 Units Subcutaneous TID WC Adonis Brook, NP   8 Units at 02/17/16 0600  . insulin glargine (LANTUS) injection  20 Units  20 Units Subcutaneous QHS Adonis Brook, NP   20 Units at 02/16/16 2207  . magnesium hydroxide (MILK OF MAGNESIA) suspension 30 mL  30 mL Oral Daily PRN Truman Hayward, FNP      . metFORMIN (GLUCOPHAGE) tablet 500 mg  500 mg Oral BID WC Kristeen Mans, NP   500 mg at 02/17/16 0809  . montelukast (SINGULAIR) tablet 10 mg  10 mg Oral QHS Kristeen Mans, NP   10 mg at 02/16/16 2208  . nicotine (NICODERM CQ - dosed in mg/24 hours) patch 21 mg  21 mg Transdermal Daily Jomarie Longs, MD   21 mg at 02/17/16 0809  . pregabalin (LYRICA) capsule 100 mg  100 mg Oral TID Kristeen Mans, NP   100 mg at 02/17/16 0809  . simvastatin (ZOCOR) tablet 40 mg  40 mg Oral QHS Kristeen Mans, NP   40 mg at 02/16/16 2208  . traZODone (DESYREL) tablet 100 mg  100 mg Oral QHS PRN Craige Cotta, MD   100 mg at 02/16/16 2208  . venlafaxine XR (EFFEXOR-XR) 24 hr capsule 225 mg  225 mg Oral Q breakfast Craige Cotta, MD   225 mg at 02/17/16 0809    PTA Medications: Prescriptions Prior to Admission  Medication Sig Dispense Refill Last Dose  . aspirin 81 MG tablet Take  81 mg by mouth daily.     Past Week at Unknown time  . cyclobenzaprine (FLEXERIL) 10 MG tablet Take 10 mg by mouth 2 (two) times daily.     Past Week at Unknown time  . desvenlafaxine (PRISTIQ) 50 MG 24 hr tablet Take 50 mg by mouth daily.     Past Week at Unknown time  . Ergocalciferol (VITAMIN D2 PO) Take 1.25 mg by mouth daily. thursdays   Past Week at Unknown time  . fenofibrate (TRICOR) 145 MG tablet Take 145 mg by mouth daily.     Past Week at Unknown time  . fluticasone (FLONASE) 50 MCG/ACT nasal spray    Past Week at Unknown time  . glipiZIDE (GLUCOTROL) 10 MG tablet Take 10 mg by mouth daily.     Past Week at Unknown time  . insulin regular (NOVOLIN R) 100 units/mL injection Inject into the skin 3 (three) times daily before meals.     Past Week at Unknown time  . Liraglutide (VICTOZA) 18 MG/3ML SOPN Inject into the skin.   Past Week at  Unknown time  . lisinopril-hydrochlorothiazide (PRINZIDE,ZESTORETIC) 20-25 MG per tablet Take 1 tablet by mouth daily.     Past Week at Unknown time  . mirtazapine (REMERON) 15 MG tablet Take 15 mg by mouth at bedtime.     Past Week at Unknown time  . omeprazole (PRILOSEC) 20 MG capsule Take 20 mg by mouth daily.     Past Week at Unknown time  . pioglitazone (ACTOS) 15 MG tablet Take 15 mg by mouth daily.     Past Week at Unknown time  . potassium chloride (K-DUR,KLOR-CON) 10 MEQ tablet    Past Week at Unknown time  . pregabalin (LYRICA) 75 MG capsule Take 75 mg by mouth 2 (two) times daily.     Past Week at Unknown time  . simvastatin (ZOCOR) 40 MG tablet Take 40 mg by mouth at bedtime.     Past Week at Unknown time    Treatment Modalities: Medication Management, Group therapy, Case management,  1 to 1 session with clinician, Psychoeducation, Recreational therapy.   Physician Treatment Plan for Primary Diagnosis: MDD (major depressive disorder), recurrent, severe, with psychosis (HCC) Long Term Goal(s): Improvement in symptoms so as ready for discharge  Short Term Goals: Ability to identify changes in lifestyle to reduce recurrence of condition will improve, Ability to verbalize feelings will improve, Ability to disclose and discuss suicidal ideas, Ability to demonstrate self-control will improve, Ability to identify and develop effective coping behaviors will improve, Ability to maintain clinical measurements within normal limits will improve, Compliance with prescribed medications will improve and Ability to identify triggers associated with substance abuse/mental health issues will improve  Medication Management: Evaluate patient's response, side effects, and tolerance of medication regimen.  Therapeutic Interventions: 1 to 1 sessions, Unit Group sessions and Medication administration.  Evaluation of Outcomes: Progressing  Physician Treatment Plan for Secondary Diagnosis: Principal  Problem:   MDD (major depressive disorder), recurrent, severe, with psychosis (HCC)   Long Term Goal(s): Improvement in symptoms so as ready for discharge  Short Term Goals: Ability to identify changes in lifestyle to reduce recurrence of condition will improve, Ability to verbalize feelings will improve, Ability to disclose and discuss suicidal ideas, Ability to demonstrate self-control will improve, Ability to identify and develop effective coping behaviors will improve, Ability to maintain clinical measurements within normal limits will improve, Compliance with prescribed medications will improve and Ability to identify triggers  associated with substance abuse/mental health issues will improve  Medication Management: Evaluate patient's response, side effects, and tolerance of medication regimen.  Therapeutic Interventions: 1 to 1 sessions, Unit Group sessions and Medication administration.  Evaluation of Outcomes: Progressing   RN Treatment Plan for Primary Diagnosis: MDD (major depressive disorder), recurrent, severe, with psychosis (HCC) Long Term Goal(s): Knowledge of disease and therapeutic regimen to maintain health will improve  Short Term Goals: Ability to verbalize feelings will improve, Ability to disclose and discuss suicidal ideas and Ability to identify and develop effective coping behaviors will improve  Medication Management: RN will administer medications as ordered by provider, will assess and evaluate patient's response and provide education to patient for prescribed medication. RN will report any adverse and/or side effects to prescribing provider.  Therapeutic Interventions: 1 on 1 counseling sessions, Psychoeducation, Medication administration, Evaluate responses to treatment, Monitor vital signs and CBGs as ordered, Perform/monitor CIWA, COWS, AIMS and Fall Risk screenings as ordered, Perform wound care treatments as ordered.  Evaluation of Outcomes:  Progressing   LCSW Treatment Plan for Primary Diagnosis: MDD (major depressive disorder), recurrent, severe, with psychosis (HCC) Long Term Goal(s): Safe transition to appropriate next level of care at discharge, Engage patient in therapeutic group addressing interpersonal concerns.  Short Term Goals: Engage patient in aftercare planning with referrals and resources, Increase ability to appropriately verbalize feelings, Identify triggers associated with mental health/substance abuse issues and Increase skills for wellness and recovery  Therapeutic Interventions: Assess for all discharge needs, 1 to 1 time with Social worker, Explore available resources and support systems, Assess for adequacy in community support network, Educate family and significant other(s) on suicide prevention, Complete Psychosocial Assessment, Interpersonal group therapy.  Evaluation of Outcomes: Progressing   Progress in Treatment: Attending groups: Yes Participating in groups: Minimally Taking medication as prescribed: Yes, MD continues to assess for medication changes as needed Toleration medication: Yes, no side effects reported at this time Family/Significant other contact made: No, CSW attempting to make contact with father Patient understands diagnosis: Continuing to assess Discussing patient identified problems/goals with staff: Yes Medical problems stabilized or resolved: Yes Denies suicidal/homicidal ideation: Yes Issues/concerns per patient self-inventory: None Other: [name]  New problem(s) identified: None identified at this time.   New Short Term/Long Term Goal(s): None identified at this time.   Discharge Plan or Barriers: CSW will assess for appropriate discharge plan and relevant barriers.   02/17/16: CSW seeking group home placement for Pt; awaiting responses.   Reason for Continuation of Hospitalization: Anxiety Depression Medication stabilization Suicidal ideation   Estimated Length of  Stay: 3-5 days  Attendees: Patient: 02/17/2016  8:54 AM  Physician: Dr. Jama Flavors 02/17/2016  8:54 AM  Nursing: Aubery Lapping, RN 02/17/2016  8:54 AM  RN Care Manager: Onnie Boer, RN 02/17/2016  8:54 AM  Social Worker: Chad Cordial, LCSW; Kristin Drinkard, LCSW 02/17/2016  8:54 AM  Recreational Therapist:  02/17/2016  8:54 AM  Other: Armandina Stammer, NP; Hillery Jacks, NP 02/17/2016  8:54 AM  Other:  02/17/2016  8:54 AM  Other: 02/17/2016  8:54 AM    Scribe for Treatment Team: Elaina Hoops, LCSW 02/17/2016 8:54 AM

## 2016-02-17 NOTE — Plan of Care (Signed)
Problem: Safety: Goal: Ability to remain free from injury will improve Outcome: Progressing Patient denies SI. Verbal contract for safety should that change.  Problem: Medication: Goal: Compliance with prescribed medication regimen will improve Outcome: Progressing Patient med compliant.

## 2016-02-18 LAB — GLUCOSE, CAPILLARY
GLUCOSE-CAPILLARY: 164 mg/dL — AB (ref 65–99)
GLUCOSE-CAPILLARY: 150 mg/dL — AB (ref 65–99)
Glucose-Capillary: 225 mg/dL — ABNORMAL HIGH (ref 65–99)
Glucose-Capillary: 165 mg/dL — ABNORMAL HIGH (ref 65–99)

## 2016-02-18 MED ORDER — TUBERCULIN PPD 5 UNIT/0.1ML ID SOLN
5.0000 [IU] | Freq: Once | INTRADERMAL | Status: AC
  Administered 2016-02-18: 5 [IU] via INTRADERMAL

## 2016-02-18 MED ORDER — BENZTROPINE MESYLATE 0.5 MG PO TABS
0.5000 mg | ORAL_TABLET | Freq: Two times a day (BID) | ORAL | Status: DC
  Administered 2016-02-18 – 2016-02-19 (×2): 0.5 mg via ORAL
  Filled 2016-02-18 (×4): qty 1

## 2016-02-18 NOTE — [facility] Group Notes (Signed)
[facility] LCSW Group Therapy 02/18/2016 1:15 PM  Type of Therapy: Group Therapy- Feelings about Diagnosis  Participation Level: Minimal  Participation Quality:  Attentive  Affect:  Appropriate  Cognitive: Alert and Oriented   Insight:  Unable to assess  Engagement in Therapy: Developing/Improving and Engaged   Modes of Intervention: Clarification, Confrontation, Discussion, Education, Exploration, Limit-setting, Orientation, Problem-solving, Rapport Building, Dance movement psychotherapist, Socialization and Support  Description of Group:   This group will allow patients to explore their thoughts and feelings about diagnoses they have received. Patients will be guided to explore their level of understanding and acceptance of these diagnoses. Facilitator will encourage patients to process their thoughts and feelings about the reactions of others to their diagnosis, and will guide patients in identifying ways to discuss their diagnosis with significant others in their lives. This group will be process-oriented, with patients participating in exploration of their own experiences as well as giving and receiving support and challenge from other group members.  Summary of Progress/Problems:  Pt continues to participate minimally in group discussion but remains attentive throughout.  Therapeutic Modalities:   Cognitive Behavioral Therapy Solution Focused Therapy Motivational Interviewing Relapse Prevention Therapy  Chad Cordial, LCSWA 02/18/2016 3:49 PM

## 2016-02-18 NOTE — [facility] Group Notes (Signed)
[facility] Group Notes:  (Nursing/MHT/Case Management/Adjunct)  Date:  02/18/2016  Time:  0900 am  Type of Therapy:  Orientation Group  Participation Level:  Minimal  Participation Quality:  Resistant  Affect:  Flat  Cognitive:  Appropriate  Insight:  Improving  Engagement in Group:  Resistant  Modes of Intervention:  Support  Summary of Progress/Problems: Mohsen asked appropriate questions.  He sat quietly and did not engage with most of the conversation during group.  Cranford Mon 02/18/2016, 10:51 AM

## 2016-02-18 NOTE — Progress Notes (Signed)
[facility] MD Progress Note  02/18/2016 11:57 AM Tony Cunningham  MRN:  [id number] Subjective:  Patient reporting feeling better and denies hearing voices today and said he did 2 days ago. He is waiting for assessment by a group home today but says he can go stay with Mother if not accepted. No suicide ideation.  Continues to complain of upper extremiety tremors which got better with a dose of cogentin.       Objective:  I have discussed case with treatment team and have met with patient. AM nurse reports patient is still experiencing tremors.   Principal Problem: MDD (major depressive disorder), recurrent, severe, with psychosis ([place]) Diagnosis:   Patient Active Problem List   Diagnosis Date Noted  . MDD (major depressive disorder), recurrent, severe, with psychosis ([place]) [F33.3] 02/07/2016  . Sinus complaint [R09.89] 12/25/2010  . Diabetes mellitus [250] 12/25/2010  . Depression [F32.9] 12/25/2010  . Anxiety [F41.9] 12/25/2010  . High blood pressure [I10] 12/25/2010   Total Time spent with patient: 30 minutes   Past Psychiatric History: see HPI  Past Medical History:  Past Medical History:  Diagnosis Date  . Anxiety   . Asthma   . Diabetes mellitus   . High blood pressure   . Sinus complaint    History reviewed. No pertinent surgical history. Family History: History reviewed. No pertinent family history. Family Psychiatric  History: see HPI Social History:  History  Alcohol Use No     History  Drug Use No    Social History   Social History  . Marital status: Single    Spouse name: [name]  . Number of children: [name]  . Years of education: [name]   Social History Main Topics  . Smoking status: Current Every Day Smoker    Packs/day: 0.50    Types: Cigarettes  . Smokeless tobacco: Never Used  . Alcohol use No  . Drug use: No  . Sexual activity: Not Currently   Other Topics Concern  . None   Social History Narrative  . None   Additional Social History:    Pain Medications:  Ibuprofen Prescriptions: see MAR Over the Counter: see MAR History of alcohol / drug use?: No history of alcohol / drug abuse      Sleep:   Good   Appetite:  Good  Current Medications: Current Facility-Administered Medications  Medication Dose Route Frequency Provider Last Rate Last Dose  . acetaminophen (TYLENOL) tablet 650 mg  650 mg Oral Q6H PRN Nanci Pina, FNP   650 mg at 02/17/16 0810  . alum & mag hydroxide-simeth (MAALOX/MYLANTA) 200-200-20 MG/5ML suspension 30 mL  30 mL Oral Q4H PRN Nanci Pina, FNP      . ARIPiprazole (ABILIFY) tablet 10 mg  10 mg Oral Daily Jenne Campus, MD   10 mg at 02/18/16 8127  . benztropine (COGENTIN) tablet 0.5 mg  0.5 mg Oral BID PRN Sueanne Margarita, MD   0.5 mg at 02/17/16 2126  . benztropine (COGENTIN) tablet 0.5 mg  0.5 mg Oral BID Sueanne Margarita, MD      . canagliflozin [facility]) tablet 300 mg  300 mg Oral QAC breakfast Lurena Nida, NP   300 mg at 02/18/16 0644  . diclofenac sodium (VOLTAREN) 1 % transdermal gel 2 g  2 g Topical BID Kerrie Buffalo, NP   2 g at 02/18/16 0815  . lisinopril (PRINIVIL,ZESTRIL) tablet 20 mg  20 mg Oral Daily Ursula Alert, MD   20 mg  at 02/18/16 0813   And  . hydrochlorothiazide (HYDRODIURIL) tablet 25 mg  25 mg Oral Daily Ursula Alert, MD   25 mg at 02/18/16 0812  . hydrOXYzine (ATARAX/VISTARIL) tablet 25 mg  25 mg Oral TID PRN Nanci Pina, FNP   25 mg at 02/17/16 1626  . Influenza vac split quadrivalent PF (FLUARIX) injection 0.5 mL  0.5 mL Intramuscular Tomorrow-1000 Saramma Eappen, MD      . insulin aspart (novoLOG) injection 0-20 Units  0-20 Units Subcutaneous TID WC Lurena Nida, NP   4 Units at 02/18/16 [phone]  . insulin aspart (novoLOG) injection 0-5 Units  0-5 Units Subcutaneous QHS Lurena Nida, NP   2 Units at 02/17/16 2129  . insulin aspart (novoLOG) injection 10 Units  10 Units Subcutaneous TID WC Encarnacion Slates, NP   10 Units at 02/18/16 0645  . insulin glargine (LANTUS) injection 22  Units  22 Units Subcutaneous QHS Encarnacion Slates, NP   22 Units at 02/17/16 2129  . magnesium hydroxide (MILK OF MAGNESIA) suspension 30 mL  30 mL Oral Daily PRN Nanci Pina, FNP      . metFORMIN (GLUCOPHAGE) tablet 500 mg  500 mg Oral BID WC Lurena Nida, NP   500 mg at 02/18/16 2956  . montelukast (SINGULAIR) tablet 10 mg  10 mg Oral QHS Lurena Nida, NP   10 mg at 02/17/16 2122  . nicotine (NICODERM CQ - dosed in mg/24 hours) patch 21 mg  21 mg Transdermal Daily Ursula Alert, MD   21 mg at 02/18/16 0814  . pregabalin (LYRICA) capsule 100 mg  100 mg Oral TID Lurena Nida, NP   100 mg at 02/18/16 2130  . simvastatin (ZOCOR) tablet 40 mg  40 mg Oral QHS Lurena Nida, NP   40 mg at 02/17/16 2122  . traZODone (DESYREL) tablet 100 mg  100 mg Oral QHS PRN Jenne Campus, MD   100 mg at 02/17/16 2302  . tuberculin injection 5 Units  5 Units Intradermal Once Sueanne Margarita, MD      . venlafaxine XR (EFFEXOR-XR) 24 hr capsule 187.5 mg  187.5 mg Oral Q breakfast Sueanne Margarita, MD   187.5 mg at 02/18/16 0813    Lab Results:  Results for orders placed or performed during the hospital encounter of 02/07/16 (from the past 48 hour(s))  Glucose, capillary     Status: Abnormal   Collection Time: 02/16/16  4:53 PM  Result Value Ref Range   Glucose-Capillary 258 (H) 65 - 99 mg/dL  Glucose, capillary     Status: Abnormal   Collection Time: 02/16/16  8:42 PM  Result Value Ref Range   Glucose-Capillary 190 (H) 65 - 99 mg/dL   Comment 1 Notify RN   Glucose, capillary     Status: Abnormal   Collection Time: 02/17/16  5:26 AM  Result Value Ref Range   Glucose-Capillary 151 (H) 65 - 99 mg/dL   Comment 1 Notify RN   Glucose, capillary     Status: Abnormal   Collection Time: 02/17/16 12:02 PM  Result Value Ref Range   Glucose-Capillary 241 (H) 65 - 99 mg/dL   Comment 1 Notify RN   Glucose, capillary     Status: Abnormal   Collection Time: 02/17/16  5:28 PM  Result Value Ref Range    Glucose-Capillary 256 (H) 65 - 99 mg/dL  Glucose, capillary     Status: Abnormal   Collection  Time: 02/17/16  8:57 PM  Result Value Ref Range   Glucose-Capillary 241 (H) 65 - 99 mg/dL  Glucose, capillary     Status: Abnormal   Collection Time: 02/18/16  6:11 AM  Result Value Ref Range   Glucose-Capillary 164 (H) 65 - 99 mg/dL  Glucose, capillary     Status: Abnormal   Collection Time: 02/18/16 11:49 AM  Result Value Ref Range   Glucose-Capillary 150 (H) 65 - 99 mg/dL    Blood Alcohol level:  No results found for: [facility]  Metabolic Disorder Labs: Lab Results  Component Value Date   HGBA1C 10.0 (H) 02/09/2016   MPG 240 02/09/2016   Lab Results  Component Value Date   PROLACTIN 3.0 (L) 02/08/2016   Lab Results  Component Value Date   CHOL 197 02/08/2016   TRIG 269 (H) 02/08/2016   HDL 44 02/08/2016   CHOLHDL 4.5 02/08/2016   VLDL 54 (H) 02/08/2016   LDLCALC 99 02/08/2016    Physical Findings: AIMS: Facial and Oral Movements Muscles of Facial Expression: None, normal Lips and Perioral Area: None, normal Jaw: None, normal Tongue: None, normal,Extremity Movements Upper (arms, wrists, hands, fingers): Minimal Lower (legs, knees, ankles, toes): None, normal, Trunk Movements Neck, shoulders, hips: None, normal, Overall Severity Severity of abnormal movements (highest score from questions above): Minimal Incapacitation due to abnormal movements: None, normal Patient's awareness of abnormal movements (rate only patient's report): Aware, mild distress, Dental Status Current problems with teeth and/or dentures?: No Does patient usually wear dentures?: No  CIWA:    COWS:     Musculoskeletal: Strength & Muscle Tone: within normal limits Gait & Station: normal Patient leans: [name]  Psychiatric Specialty Exam: Physical Exam  Nursing note and vitals reviewed.   ROS denies headache, denies chest pain, no shortness of breath, describes neck and lower back pain, discomfort    Blood pressure 109/77, pulse 90, temperature 97.7 F (36.5 C), temperature source Oral, resp. rate 16, height 6' (1.829 m), weight 84.8 kg (187 lb), SpO2 98 %.Body mass index is 25.36 kg/m.  General Appearance:  Grooming has tended to improve   Eye Contact:  Good  Speech:  Normal Rate  Volume:  Normal  Mood:   Euthymic, brightens upon approach  Affect:   improving, more reactive congruent  Thought Process:  Goal Directed  Orientation:  Full (Time, Place, and Person)  Thought Content:   No hallucinations reported today  Suicidal Thoughts:  At this time denies any plan or intention of hurting self or of suicide and contracts for safety on unit   Homicidal Thoughts:  No denies any violent or homicidal ideations   Memory:  Immediate;   Good Recent;   Good Remote;   Good  Judgement:  Improving   Insight:   Improving   Psychomotor Activity:  Normal  Concentration:  Concentration: Good and Attention Span: Good  Recall:  Good  Fund of Knowledge:  Good  Language:  Good  Akathisia:  Negative  Handed:  Right  AIMS (if indicated):     Assets:  Desire for Improvement Resilience  ADL's:  Intact  Cognition:  WNL  Sleep:  Number of Hours: 5.5    Assessment - Patient continues to improve gradually but significantly compared to his admission presentation - mood is improving , affect is more reactive, he is less avoidant and more visible in day room, grooming is improved, hallucinations have resolved. At this time treatment plan continues to be for patient to go to  a Group Home on discharge. Thus far tolerating medications well .   Treatment Plan Summary: Daily contact with patient to assess and evaluate symptoms and progress in treatment and Medication management  Decrease venlafaxine to 187.5 daily Continue cogentin .13m bid Encourage group and milieu participation to work on cRadiographer, therapeuticand symptom reduction  Continue  Abilify  10 mgrs QDAY to for  mood and psychotic symptoms Continue    Effexor XR  225 mgrs QAM for ongoing depression  Continue Trazodone  100 mgrs QHS PRN for insomnia  Continue Vistaril PRNs for anxiety as needed  Continue DM management  Treatment team working on disposition options -  Patient is requesting   being referred for group home placement:  [place] other [place] needed for this disposition process .   URuffin Frederick MD [facility]9/26/2017, 11:57 AM

## 2016-02-18 NOTE — Progress Notes (Signed)
D: Pt presents with flat affect and depressed mood. Pt denies SI, HI and AVH at present. Per pt "I'm okay,  I'm not hearing no voices right now". Rates his pain 5/10. Visible in milieu with peers during activities. Minimal but appropriate interactions observed.  A: Emotional support and availability provided to pt. Scheduled and PRN medications (Cogentin--tremors) administered as prescribed. Encouraged to voice concerns. Q 15 minutes checks maintained for safety without incident thus far.  R: Pt receptive to care. Compliant with medications as ordered. Cooperative with unit routines. Attended scheduled unit groups. Remains safe on and off unit without issues. POC remains effective.

## 2016-02-18 NOTE — B[facility] Group Notes (Signed)
Chaplain encountered Tony Cunningham in his room on [address].   Invited to attend group on Grief and Loss.  Tony Cunningham did not attend group.

## 2016-02-18 NOTE — Progress Notes (Signed)
D: Pt endorses moderate anxiety; states, "I am a little anxious right now." Pt was observed in the dayroom withdrawn to self. Pt is flat and slow to respond. Pt could be easily angered; became verbally aggressive when name was mispronounced by another Pt. A: Medications offered as prescribed.  Support, encouragement, and safe environment provided.  15-minute safety checks continue. R: Pt was med compliant.  Pt attended wrap-up group. Safety checks continue.

## 2016-02-18 NOTE — [facility] Group Notes (Signed)
Adult Psychoeducational Group Note  Date:  02/18/2016 Time:  9:13 PM  Group Topic/Focus:  Wrap-Up Group:   The focus of this group is to help patients review their daily goal of treatment and discuss progress on daily workbooks.   Participation Level:  Active  Participation Quality:  Appropriate  Affect:  Appropriate  Cognitive:  Appropriate  Insight: Good  Engagement in Group:  Engaged  Modes of Intervention:  Discussion  Additional Comments:  Pt stated that his goal for today was to talk to the doctor about his discharge. He feels that he has accomplished his goal. Today he rated his day an 8 out of 10. Delia Chimes 02/18/2016, 9:13 PM

## 2016-02-18 NOTE — Progress Notes (Signed)
Patient ID: Tony Cunningham, male   DOB: 1975-08-18, 40 y.o.   MRN: [id number] D: Client visible on the unit, reports of his day "its good, feel good, not sluggish, felt like I  had energy" A: Writer provided emotional support, encouraged client to verbalize any concerns. Staff will monitor q10min for safety R: Client is safe on the unit, attended group.

## 2016-02-19 LAB — GLUCOSE, CAPILLARY
GLUCOSE-CAPILLARY: 193 mg/dL — AB (ref 65–99)
GLUCOSE-CAPILLARY: 181 mg/dL — AB (ref 65–99)
GLUCOSE-CAPILLARY: 220 mg/dL — AB (ref 65–99)
Glucose-Capillary: 351 mg/dL — ABNORMAL HIGH (ref 65–99)
Glucose-Capillary: 173 mg/dL — ABNORMAL HIGH (ref 65–99)

## 2016-02-19 MED ORDER — BENZTROPINE MESYLATE 1 MG PO TABS
1.0000 mg | ORAL_TABLET | Freq: Two times a day (BID) | ORAL | Status: DC
  Administered 2016-02-19 – 2016-02-21 (×4): 1 mg via ORAL
  Filled 2016-02-19 (×6): qty 1

## 2016-02-19 NOTE — Progress Notes (Signed)
[facility] MD Progress Note  02/19/2016 10:29 AM Tony Cunningham  MRN:  [id number] Subjective:  Patient reporting feeling better and denies hearing voices today and said he did 2 days ago. He is waiting for assessment by a group home today but says he can go stay with Mother if not accepted. No suicide ideation.  Upper extremity tremors are less but he is now complaining of muscle stiffness.         Objective:  I have discussed case with treatment team and have met with patient. AM nurse reports that patients assessment by a group home was successful and that they might have a bed for him in 2 days  Principal Problem: MDD (major depressive disorder), recurrent, severe, with psychosis ([name]) Diagnosis:   Patient Active Problem List   Diagnosis Date Noted  . MDD (major depressive disorder), recurrent, severe, with psychosis (Tony Cunningham) [F33.3] 02/07/2016  . Sinus complaint [R09.89] 12/25/2010  . Diabetes mellitus [250] 12/25/2010  . Depression [F32.9] 12/25/2010  . Anxiety [F41.9] 12/25/2010  . High blood pressure [I10] 12/25/2010   Total Time spent with patient: 30 minutes   Past Psychiatric History: see HPI  Past Medical History:  Past Medical History:  Diagnosis Date  . Anxiety   . Asthma   . Diabetes mellitus   . High blood pressure   . Sinus complaint    History reviewed. No pertinent surgical history. Family History: History reviewed. No pertinent family history. Family Psychiatric  History: see HPI Social History:  History  Alcohol Use No     History  Drug Use No    Social History   Social History  . Marital status: Single    Spouse name: [name]  . Number of children: [name]  . Years of education: [name]   Social History Main Topics  . Smoking status: Current Every Day Smoker    Packs/day: 0.50    Types: Cigarettes  . Smokeless tobacco: Never Used  . Alcohol use No  . Drug use: No  . Sexual activity: Not Currently   Other Topics Concern  . None   Social History Narrative  .  None   Additional Social History:    Pain Medications: Ibuprofen Prescriptions: see MAR Over the Counter: see MAR History of alcohol / drug use?: No history of alcohol / drug abuse      Sleep:   Good   Appetite:  Good  Current Medications: Current Facility-Administered Medications  Medication Dose Route Frequency Provider Last Rate Last Dose  . acetaminophen (TYLENOL) tablet 650 mg  650 mg Oral Q6H PRN Nanci Pina, FNP   650 mg at 02/18/16 1717  . alum & mag hydroxide-simeth (MAALOX/MYLANTA) 200-200-20 MG/5ML suspension 30 mL  30 mL Oral Q4H PRN Nanci Pina, FNP      . ARIPiprazole (ABILIFY) tablet 10 mg  10 mg Oral Daily Jenne Campus, MD   10 mg at 02/19/16 0825  . benztropine (COGENTIN) tablet 0.5 mg  0.5 mg Oral BID PRN Sueanne Margarita, MD   0.5 mg at 02/18/16 1209  . benztropine (COGENTIN) tablet 0.5 mg  0.5 mg Oral BID Sueanne Margarita, MD   0.5 mg at 02/19/16 0825  . canagliflozin (INVOKANA) tablet 300 mg  300 mg Oral QAC breakfast Lurena Nida, NP   300 mg at 02/19/16 0630  . diclofenac sodium (VOLTAREN) 1 % transdermal gel 2 g  2 g Topical BID Kerrie Buffalo, NP   2 g at 02/18/16 1718  .  lisinopril (PRINIVIL,ZESTRIL) tablet 20 mg  20 mg Oral Daily Ursula Alert, MD   20 mg at 02/19/16 0826   And  . hydrochlorothiazide (HYDRODIURIL) tablet 25 mg  25 mg Oral Daily Saramma Eappen, MD   25 mg at 02/19/16 0825  . hydrOXYzine (ATARAX/VISTARIL) tablet 25 mg  25 mg Oral TID PRN Nanci Pina, FNP   25 mg at 02/17/16 1626  . Influenza vac split quadrivalent PF (FLUARIX) injection 0.5 mL  0.5 mL Intramuscular Tomorrow-1000 Saramma Eappen, MD      . insulin aspart (novoLOG) injection 0-20 Units  0-20 Units Subcutaneous TID WC Lurena Nida, NP   4 Units at 02/19/16 0630  . insulin aspart (novoLOG) injection 0-5 Units  0-5 Units Subcutaneous QHS Lurena Nida, NP   2 Units at 02/17/16 2129  . insulin aspart (novoLOG) injection 10 Units  10 Units Subcutaneous TID WC Encarnacion Slates, NP   10 Units at 02/19/16 0631  . insulin glargine (LANTUS) injection 22 Units  22 Units Subcutaneous QHS Encarnacion Slates, NP   22 Units at 02/18/16 2218  . magnesium hydroxide (MILK OF MAGNESIA) suspension 30 mL  30 mL Oral Daily PRN Nanci Pina, FNP      . metFORMIN (GLUCOPHAGE) tablet 500 mg  500 mg Oral BID WC Lurena Nida, NP   500 mg at 02/19/16 0826  . montelukast (SINGULAIR) tablet 10 mg  10 mg Oral QHS Lurena Nida, NP   10 mg at 02/18/16 2217  . nicotine (NICODERM CQ - dosed in mg/24 hours) patch 21 mg  21 mg Transdermal Daily Ursula Alert, MD   21 mg at 02/19/16 0825  . pregabalin (LYRICA) capsule 100 mg  100 mg Oral TID Lurena Nida, NP   100 mg at 02/19/16 0825  . simvastatin (ZOCOR) tablet 40 mg  40 mg Oral QHS Lurena Nida, NP   40 mg at 02/18/16 2217  . traZODone (DESYREL) tablet 100 mg  100 mg Oral QHS PRN Jenne Campus, MD   100 mg at 02/17/16 2302  . tuberculin injection 5 Units  5 Units Intradermal Once Sueanne Margarita, MD   5 Units at 02/18/16 1200  . venlafaxine XR (EFFEXOR-XR) 24 hr capsule 187.5 mg  187.5 mg Oral Q breakfast Sueanne Margarita, MD   187.5 mg at 02/19/16 0825    Lab Results:  Results for orders placed or performed during the hospital encounter of 02/07/16 (from the past 48 hour(s))  Glucose, capillary     Status: Abnormal   Collection Time: 02/17/16 12:02 PM  Result Value Ref Range   Glucose-Capillary 241 (H) 65 - 99 mg/dL   Comment 1 Notify RN   Glucose, capillary     Status: Abnormal   Collection Time: 02/17/16  5:28 PM  Result Value Ref Range   Glucose-Capillary 256 (H) 65 - 99 mg/dL  Glucose, capillary     Status: Abnormal   Collection Time: 02/17/16  8:57 PM  Result Value Ref Range   Glucose-Capillary 241 (H) 65 - 99 mg/dL  Glucose, capillary     Status: Abnormal   Collection Time: 02/18/16  6:11 AM  Result Value Ref Range   Glucose-Capillary 164 (H) 65 - 99 mg/dL  Glucose, capillary     Status: Abnormal   Collection Time:  02/18/16 11:49 AM  Result Value Ref Range   Glucose-Capillary 150 (H) 65 - 99 mg/dL  Glucose, capillary  Status: Abnormal   Collection Time: 02/18/16  5:00 PM  Result Value Ref Range   Glucose-Capillary 225 (H) 65 - 99 mg/dL   Comment 1 Notify RN   Glucose, capillary     Status: Abnormal   Collection Time: 02/18/16  8:14 PM  Result Value Ref Range   Glucose-Capillary 165 (H) 65 - 99 mg/dL  Glucose, capillary     Status: Abnormal   Collection Time: 02/19/16  6:26 AM  Result Value Ref Range   Glucose-Capillary 193 (H) 65 - 99 mg/dL    Blood Alcohol level:  No results found for: [facility]  Metabolic Disorder Labs: Lab Results  Component Value Date   HGBA1C 10.0 (H) 02/09/2016   MPG 240 02/09/2016   Lab Results  Component Value Date   PROLACTIN 3.0 (L) 02/08/2016   Lab Results  Component Value Date   CHOL 197 02/08/2016   TRIG 269 (H) 02/08/2016   HDL 44 02/08/2016   CHOLHDL 4.5 02/08/2016   VLDL 54 (H) 02/08/2016   LDLCALC 99 02/08/2016    Physical Findings: AIMS: Facial and Oral Movements Muscles of Facial Expression: None, normal Lips and Perioral Area: None, normal Jaw: None, normal Tongue: None, normal,Extremity Movements Upper (arms, wrists, hands, fingers): Minimal Lower (legs, knees, ankles, toes): None, normal, Trunk Movements Neck, shoulders, hips: None, normal, Overall Severity Severity of abnormal movements (highest score from questions above): Minimal Incapacitation due to abnormal movements: None, normal Patient's awareness of abnormal movements (rate only patient's report): Aware, mild distress, Dental Status Current problems with teeth and/or dentures?: No Does patient usually wear dentures?: No  CIWA:    COWS:     Musculoskeletal: Strength & Muscle Tone: within normal limits Gait & Station: normal Patient leans: [name]  Psychiatric Specialty Exam: Physical Exam  Nursing note and vitals reviewed.   ROS denies headache, denies chest pain, no  shortness of breath, describes neck and lower back pain, discomfort   Blood pressure 92/65, pulse 78, temperature 97.3 F (36.3 C), temperature source Oral, resp. rate 18, height 6' (1.829 m), weight 84.8 kg (187 lb), SpO2 98 %.Body mass index is 25.36 kg/m.  General Appearance:  Grooming has tended to improve   Eye Contact:  Good  Speech:  Normal Rate  Volume:  Normal  Mood:   Euthymic, brightens upon approach  Affect:   improving, more reactive congruent  Thought Process:  Goal Directed  Orientation:  Full (Time, Place, and Person)  Thought Content:   No hallucinations reported today  Suicidal Thoughts:  At this time denies any plan or intention of hurting self or of suicide and contracts for safety on unit   Homicidal Thoughts:  No denies any violent or homicidal ideations   Memory:  Immediate;   Good Recent;   Good Remote;   Good  Judgement:  Improving   Insight:   Improving   Psychomotor Activity:  Normal  Concentration:  Concentration: Good and Attention Span: Good  Recall:  Good  Fund of Knowledge:  Good  Language:  Good  Akathisia:  Negative  Handed:  Right  AIMS (if indicated):     Assets:  Desire for Improvement Resilience  ADL's:  Intact  Cognition:  WNL  Sleep:  Number of Hours: 6    Assessment - Patient continues to improve gradually but significantly compared to his admission presentation - mood is improving , affect is more reactive, he is less avoidant and more visible in day room, grooming is improved, hallucinations have resolved.  At this time treatment plan continues to be for patient to go to a Group Home on discharge. Thus far tolerating medications well .   Treatment Plan Summary: Daily contact with patient to assess and evaluate symptoms and progress in treatment and Medication management  Decrease venlafaxine to 187.5 daily Increase  cogentin to 77m bid Encourage group and milieu participation to work on coping skills and symptom reduction  Continue   Abilify  10 mgrs QDAY to for  mood and psychotic symptoms Continue   Effexor XR  225 mgrs QAM for ongoing depression  Continue Trazodone  100 mgrs QHS PRN for insomnia  Continue Vistaril PRNs for anxiety as needed  Continue DM management  Treatment team working on disposition options -  Patient is requesting   being referred for group home placement:  [place] other [place] needed for [place] disposition process .   URuffin Frederick MD [facility]9/27/2017, 10:29 AM

## 2016-02-19 NOTE — [facility] Group Notes (Signed)
Pt attended group today. Pt stated he spoke to Dr regarding shaking in his hands. He said the Dr. Prescribed him something to help him with that. Pt stated he feels better and the medicine has helped.

## 2016-02-19 NOTE — Progress Notes (Signed)
Patient ID: Tony Cunningham, male   DOB: [birth year], 40 y.o.   MRN: [id number] PER STATE REGULATIONS 482.30  THIS CHART WAS REVIEWED FOR MEDICAL NECESSITY WITH RESPECT TO THE PATIENT'S ADMISSION/ DURATION OF STAY.  NEXT REVIEW DATE: 02/23/2016  Willa Rough, RN, BSN CASE MANAGER

## 2016-02-19 NOTE — Plan of Care (Addendum)
Problem: Self-Concept: Goal: Ability to verbalize positive feelings about self will improve Outcome: Progressing Client verbalizes positive feelings about self AEB report of his day "It's been good" "feel good, not sluggish, felt like I had energy"

## 2016-02-19 NOTE — Plan of Care (Signed)
Problem: Safety: Goal: Periods of time without injury will increase Outcome: Progressing Pt. remains a low fall risk, denies SI/HI at this time, Q 15 checks in place.    

## 2016-02-19 NOTE — [facility] Group Notes (Signed)
[facility] LCSW Group Therapy 02/19/2016 1:15 PM  Type of Therapy: Group Therapy- Emotion Regulation  Participation Level: Minimal  Participation Quality:  Reserved  Affect: Appropriate  Cognitive: Alert and Oriented   Insight:  Developing/Improving  Engagement in Therapy: Developing/Improving and Engaged   Modes of Intervention: Clarification, Confrontation, Discussion, Education, Exploration, Limit-setting, Orientation, Problem-solving, Rapport Building, Dance movement psychotherapist, Socialization and Support  Summary of Progress/Problems: The topic for group today was emotional regulation. This group focused on both positive and negative emotion identification and allowed group members to process ways to identify feelings, regulate negative emotions, and find healthy ways to manage internal/external emotions. Group members were asked to reflect on a time when their reaction to an emotion led to a negative outcome and explored how alternative responses using emotion regulation would have benefited them. Group members were also asked to discuss a time when emotion regulation was utilized when a negative emotion was experienced. Pt continues to be reserved in discussion but was attentive and receptive to the handout.    Chad Cordial, LCSWA 02/19/2016 3:28 PM

## 2016-02-19 NOTE — Progress Notes (Signed)
D: Pt presents with flat affect and depressed mood. Pt reports decreased depression 7/10. Anxiety 6/10. hopeless 4/10. Pt denies suicidal thoughts and verbally contracts for safety. Pt denies AVH. Pt stated that he's going to a group home after discharge. Pt noted to have mild bilat hand tremors. Pt given scheduled cogentin for tremors. Pt compliant with tx today.  A: Medications administered as ordered per MD. Verbal support provided. Pt encouraged to attend groups. 15 minute checks performed for safety.  R: Pt receptive to tx.

## 2016-02-20 LAB — GLUCOSE, CAPILLARY
GLUCOSE-CAPILLARY: 150 mg/dL — AB (ref 65–99)
GLUCOSE-CAPILLARY: 225 mg/dL — AB (ref 65–99)
Glucose-Capillary: 185 mg/dL — ABNORMAL HIGH (ref 65–99)
Glucose-Capillary: 249 mg/dL — ABNORMAL HIGH (ref 65–99)

## 2016-02-20 MED ORDER — INSULIN ASPART 100 UNIT/ML ~~LOC~~ SOLN
15.0000 [IU] | Freq: Three times a day (TID) | SUBCUTANEOUS | Status: DC
  Administered 2016-02-21: 15 [IU] via SUBCUTANEOUS

## 2016-02-20 NOTE — [facility] Group Notes (Signed)
[facility] Group Therapy 02/20/2016 1:15pm  Type of Therapy: Mental Health Association Presentation  Participation Level: Active  Participation Quality: Attentive  Affect: Appropriate  Cognitive: Oriented  Insight: Developing/Improving  Engagement in Therapy: Engaged  Modes of Intervention: Discussion, Education and Socialization  Summary of Progress/Problems: Mental Health Association (MHA) Speaker came to talk about his personal journey with substance abuse and addiction. The pt processed ways by which to relate to the speaker. MHA speaker provided handouts and educational information pertaining to groups and services offered by the [facility]. Pt was engaged in speaker's presentation and was receptive to resources provided.    Chad Cordial, LCSWA 02/20/2016 1:55 PM

## 2016-02-20 NOTE — Progress Notes (Deleted)
Error in charting.

## 2016-02-20 NOTE — [facility] Group Notes (Signed)
Pt attended group and the subject was on lifestyle changes. Writer ask what is one thing you would change about yourself or your life? Pt stated to start eating better and not argue with mom as much.

## 2016-02-20 NOTE — Progress Notes (Signed)
Adult Psychoeducational Group Note  Date:  02/20/2016 Time:  0845 am  Group Topic/Focus:  Orientation:   The focus of this group is to educate the patient on the purpose and policies of crisis stabilization and provide a format to answer questions about their admission.  The group details unit policies and expectations of patients while admitted.   Participation Level:  Active  Participation Quality:  Appropriate  Affect:  Appropriate  Cognitive:  Appropriate  Insight: Appropriate  Engagement in Group:  Engaged  Modes of Intervention:  Discussion, Education and Orientation  Additional Comments:   Tony Cunningham 02/20/2016, 4:06 PM

## 2016-02-20 NOTE — Progress Notes (Signed)
Inpatient Diabetes Program Recommendations  AACE/ADA: New Consensus Statement on Inpatient Glycemic Control (2015)  Target Ranges:  Prepandial:   less than 140 mg/dL      Peak postprandial:   less than 180 mg/dL (1-2 hours)      Critically ill patients:  140 - 180 mg/dL   Results for MEZIAH, BLASINGAME (MRN [id number]) as of 02/20/2016 11:50  Ref. Range 02/18/2016 06:11 02/18/2016 11:49 02/18/2016 17:00 02/18/2016 20:14 02/19/2016 06:26 02/19/2016 11:48 02/19/2016 12:02 02/19/2016 17:20 02/19/2016 21:09 02/20/2016 06:15  Glucose-Capillary Latest Ref Range: 65 - 99 mg/dL 086 (H) 578 (H) 469 (H) 165 (H) 193 (H) 220 (H) 181 (H) 351 (H) 173 (H) 150 (H)   Results for CRIS, TALAVERA (MRN [id number]) as of 02/20/2016 11:50  Ref. Range 02/09/2016 06:25  Hemoglobin A1C Latest Ref Range: 4.8 - 5.6 % 10.0 (H)    Review of Glycemic Control  Diabetes history: DM2 Outpatient Diabetes medications: Current orders for Inpatient glycemic control: Novolog 0-20 units TID WC, 0-5 units QHS, 10 units w/ meals, Lantus 22 units QHS, Metformin 500 mg BID, Invokana 300 mg daily before breakfast.   Inpatient Diabetes Program Recommendations: Please consider increasing Novolog meal coverage to 15 units TID with meals.  Thank you,  Kristine Linea, RN, BSN Diabetes Coordinator Inpatient Diabetes Program [phone] (Team Pager) [phone] ([facility] office) [phone] [facility] office) [phone] [facility] office)

## 2016-02-20 NOTE — Progress Notes (Signed)
  D :Pt reports decreasing symptoms. Depression 5/10. Anxiety 6/10. Pt reports constantly feeling anxious. Pt requested vistaril for anxiety. Pt denies suicidal thoughts. Pt c/o bilat hand tremors. Pt given scheduled cogentin.  A: PPD read and documented. See chart for documentation. Medications reviewed with pt. Medications administered as ordered per MD. Verbal support provided. Pt encouraged to attend groups. 15 minute checks performed for safety. R: Pt receptive to tx.

## 2016-02-20 NOTE — Plan of Care (Signed)
Problem: Activity: Goal: Interest or engagement in activities will improve Outcome: Progressing Pt. attended karoke this evening.

## 2016-02-20 NOTE — Progress Notes (Signed)
D: Pt. is up and visible in the milieu, to himself.  Denies having any SI/HI/AVH/Pain at this time. Pt. presents with a depressed affect and mood. Pt. attended karaoke this evening.Pt. States "my day was great". When asked about D/C, Pt. states he feels ready to go to the group home.  A: Encouragement and support given. Meds. ordered and given. BS @ bedtime was 249. PRNs Trazodone and Visteril requested and given. Will re-eval as necessary.   R: Safety maintained with Q 15 checks. Continues to follow treatment plan and will monitor closely. No questions/concerns at this time.

## 2016-02-20 NOTE — Tx Team (Signed)
Interdisciplinary Treatment and Diagnostic Plan Update **Late entry from 9/27** 02/20/2016 Time of Session: 1:51 PM  Tony Cunningham MRN: [id number]  Principal Diagnosis: MDD (major depressive disorder), recurrent, severe, with psychosis (HCC)  Secondary Diagnoses: Principal Problem:   MDD (major depressive disorder), recurrent, severe, with psychosis (HCC)   Current Medications:  Current Facility-Administered Medications  Medication Dose Route Frequency Provider Last Rate Last Dose  . acetaminophen (TYLENOL) tablet 650 mg  650 mg Oral Q6H PRN Truman Hayward, FNP   650 mg at 02/18/16 1717  . alum & mag hydroxide-simeth (MAALOX/MYLANTA) 200-200-20 MG/5ML suspension 30 mL  30 mL Oral Q4H PRN Truman Hayward, FNP      . ARIPiprazole (ABILIFY) tablet 10 mg  10 mg Oral Daily Craige Cotta, MD   10 mg at 02/20/16 0829  . benztropine (COGENTIN) tablet 0.5 mg  0.5 mg Oral BID PRN Molinda Bailiff, MD   0.5 mg at 02/18/16 1209  . benztropine (COGENTIN) tablet 1 mg  1 mg Oral BID Molinda Bailiff, MD   1 mg at 02/20/16 0829  . canagliflozin (INVOKANA) tablet 300 mg  300 mg Oral QAC breakfast Kristeen Mans, NP   300 mg at 02/20/16 0175  . diclofenac sodium (VOLTAREN) 1 % transdermal gel 2 g  2 g Topical BID Adonis Brook, NP   2 g at 02/20/16 0829  . lisinopril (PRINIVIL,ZESTRIL) tablet 20 mg  20 mg Oral Daily Jomarie Longs, MD   20 mg at 02/20/16 0828   And  . hydrochlorothiazide (HYDRODIURIL) tablet 25 mg  25 mg Oral Daily Saramma Eappen, MD   25 mg at 02/20/16 0828  . hydrOXYzine (ATARAX/VISTARIL) tablet 25 mg  25 mg Oral TID PRN Truman Hayward, FNP   25 mg at 02/20/16 0645  . Influenza vac split quadrivalent PF (FLUARIX) injection 0.5 mL  0.5 mL Intramuscular Tomorrow-1000 Saramma Eappen, MD      . insulin aspart (novoLOG) injection 0-20 Units  0-20 Units Subcutaneous TID WC Kristeen Mans, NP   4 Units at 02/20/16 1219  . insulin aspart (novoLOG) injection 0-5 Units  0-5 Units Subcutaneous QHS  Kristeen Mans, NP   2 Units at 02/17/16 2129  . insulin aspart (novoLOG) injection 10 Units  10 Units Subcutaneous TID WC Sanjuana Kava, NP   10 Units at 02/20/16 1220  . insulin glargine (LANTUS) injection 22 Units  22 Units Subcutaneous QHS Sanjuana Kava, NP   22 Units at 02/19/16 2253  . magnesium hydroxide (MILK OF MAGNESIA) suspension 30 mL  30 mL Oral Daily PRN Truman Hayward, FNP      . metFORMIN (GLUCOPHAGE) tablet 500 mg  500 mg Oral BID WC Kristeen Mans, NP   500 mg at 02/20/16 1025  . montelukast (SINGULAIR) tablet 10 mg  10 mg Oral QHS Kristeen Mans, NP   10 mg at 02/19/16 2253  . nicotine (NICODERM CQ - dosed in mg/24 hours) patch 21 mg  21 mg Transdermal Daily Jomarie Longs, MD   21 mg at 02/20/16 0829  . pregabalin (LYRICA) capsule 100 mg  100 mg Oral TID Kristeen Mans, NP   100 mg at 02/20/16 1217  . simvastatin (ZOCOR) tablet 40 mg  40 mg Oral QHS Kristeen Mans, NP   40 mg at 02/19/16 2253  . traZODone (DESYREL) tablet 100 mg  100 mg Oral QHS PRN Craige Cotta, MD   100 mg at 02/19/16 2253  .  venlafaxine XR (EFFEXOR-XR) 24 hr capsule 187.5 mg  187.5 mg Oral Q breakfast Molinda Bailiff, MD   187.5 mg at 02/20/16 1610    PTA Medications: Prescriptions Prior to Admission  Medication Sig Dispense Refill Last Dose  . aspirin 81 MG tablet Take 81 mg by mouth daily.     Past Week at Unknown time  . cyclobenzaprine (FLEXERIL) 10 MG tablet Take 10 mg by mouth 2 (two) times daily.     Past Week at Unknown time  . desvenlafaxine (PRISTIQ) 50 MG 24 hr tablet Take 50 mg by mouth daily.     Past Week at Unknown time  . Ergocalciferol (VITAMIN D2 PO) Take 1.25 mg by mouth daily. thursdays   Past Week at Unknown time  . fenofibrate (TRICOR) 145 MG tablet Take 145 mg by mouth daily.     Past Week at Unknown time  . fluticasone (FLONASE) 50 MCG/ACT nasal spray    Past Week at Unknown time  . glipiZIDE (GLUCOTROL) 10 MG tablet Take 10 mg by mouth daily.     Past Week at Unknown time  .  insulin regular (NOVOLIN R) 100 units/mL injection Inject into the skin 3 (three) times daily before meals.     Past Week at Unknown time  . Liraglutide (VICTOZA) 18 MG/3ML SOPN Inject into the skin.   Past Week at Unknown time  . lisinopril-hydrochlorothiazide (PRINZIDE,ZESTORETIC) 20-25 MG per tablet Take 1 tablet by mouth daily.     Past Week at Unknown time  . mirtazapine (REMERON) 15 MG tablet Take 15 mg by mouth at bedtime.     Past Week at Unknown time  . omeprazole (PRILOSEC) 20 MG capsule Take 20 mg by mouth daily.     Past Week at Unknown time  . pioglitazone (ACTOS) 15 MG tablet Take 15 mg by mouth daily.     Past Week at Unknown time  . potassium chloride (K-DUR,KLOR-CON) 10 MEQ tablet    Past Week at Unknown time  . pregabalin (LYRICA) 75 MG capsule Take 75 mg by mouth 2 (two) times daily.     Past Week at Unknown time  . simvastatin (ZOCOR) 40 MG tablet Take 40 mg by mouth at bedtime.     Past Week at Unknown time    Treatment Modalities: Medication Management, Group therapy, Case management,  1 to 1 session with clinician, Psychoeducation, Recreational therapy.   Physician Treatment Plan for Primary Diagnosis: MDD (major depressive disorder), recurrent, severe, with psychosis (HCC) Long Term Goal(s): Improvement in symptoms so as ready for discharge  Short Term Goals: Ability to identify changes in lifestyle to reduce recurrence of condition will improve, Ability to verbalize feelings will improve, Ability to disclose and discuss suicidal ideas, Ability to demonstrate self-control will improve, Ability to identify and develop effective coping behaviors will improve, Ability to maintain clinical measurements within normal limits will improve, Compliance with prescribed medications will improve and Ability to identify triggers associated with substance abuse/mental health issues will improve  Medication Management: Evaluate patient's response, side effects, and tolerance of  medication regimen.  Therapeutic Interventions: 1 to 1 sessions, Unit Group sessions and Medication administration.  Evaluation of Outcomes: Adequate for Discharge  Physician Treatment Plan for Secondary Diagnosis: Principal Problem:   MDD (major depressive disorder), recurrent, severe, with psychosis (HCC)   Long Term Goal(s): Improvement in symptoms so as ready for discharge  Short Term Goals: Ability to identify changes in lifestyle to reduce recurrence of condition will improve,  Ability to verbalize feelings will improve, Ability to disclose and discuss suicidal ideas, Ability to demonstrate self-control will improve, Ability to identify and develop effective coping behaviors will improve, Ability to maintain clinical measurements within normal limits will improve, Compliance with prescribed medications will improve and Ability to identify triggers associated with substance abuse/mental health issues will improve  Medication Management: Evaluate patient's response, side effects, and tolerance of medication regimen.  Therapeutic Interventions: 1 to 1 sessions, Unit Group sessions and Medication administration.  Evaluation of Outcomes: Adequate for Discharge   RN Treatment Plan for Primary Diagnosis: MDD (major depressive disorder), recurrent, severe, with psychosis (HCC) Long Term Goal(s): Knowledge of disease and therapeutic regimen to maintain health will improve  Short Term Goals: Ability to verbalize feelings will improve, Ability to disclose and discuss suicidal ideas and Ability to identify and develop effective coping behaviors will improve  Medication Management: RN will administer medications as ordered by provider, will assess and evaluate patient's response and provide education to patient for prescribed medication. RN will report any adverse and/or side effects to prescribing provider.  Therapeutic Interventions: 1 on 1 counseling sessions, Psychoeducation, Medication  administration, Evaluate responses to treatment, Monitor vital signs and CBGs as ordered, Perform/monitor CIWA, COWS, AIMS and Fall Risk screenings as ordered, Perform wound care treatments as ordered.  Evaluation of Outcomes: Adequate for Discharge   LCSW Treatment Plan for Primary Diagnosis: MDD (major depressive disorder), recurrent, severe, with psychosis (HCC) Long Term Goal(s): Safe transition to appropriate next level of care at discharge, Engage patient in therapeutic group addressing interpersonal concerns.  Short Term Goals: Engage patient in aftercare planning with referrals and resources, Increase ability to appropriately verbalize feelings, Identify triggers associated with mental health/substance abuse issues and Increase skills for wellness and recovery  Therapeutic Interventions: Assess for all discharge needs, 1 to 1 time with Social worker, Explore available resources and support systems, Assess for adequacy in community support network, Educate family and significant other(s) on suicide prevention, Complete Psychosocial Assessment, Interpersonal group therapy.  Evaluation of Outcomes: Adequate for Discharge   Progress in Treatment: Attending groups: Yes Participating in groups: Minimally Taking medication as prescribed: Yes, MD continues to assess for medication changes as needed Toleration medication: Yes, no side effects reported at this time Family/Significant other contact made: No, CSW attempting to make contact with father/mother Patient understands diagnosis: Continuing to assess Discussing patient identified problems/goals with staff: Yes Medical problems stabilized or resolved: Yes Denies suicidal/homicidal ideation: Yes Issues/concerns per patient self-inventory: None Other: [name]  New problem(s) identified: None identified at this time.   New Short Term/Long Term Goal(s): None identified at this time.   Discharge Plan or Barriers: CSW will assess for  appropriate discharge plan and relevant barriers.   02/17/16: CSW seeking group home placement for Pt; awaiting responses.  02/20/2016: Pt dishcarging to [facility]; will follow-up with [facility]   Reason for Continuation of Hospitalization: Anxiety Depression Medication stabilization Suicidal ideation   Estimated Length of Stay: 1 day  Attendees: Patient: 02/20/2016  1:51 PM  Physician: Dr. Seth Bake 02/20/2016  1:51 PM  Nursing: Merian Capron, Liborio Nixon 02/20/2016  1:51 PM  RN Care Manager: Onnie Boer, RN 02/20/2016  1:51 PM  Social Worker: Chad Cordial, LCSW; Kristin Drinkard, LCSW 02/20/2016  1:51 PM  Recreational Therapist:  02/20/2016  1:51 PM  Other: Armandina Stammer, NP; Hillery Jacks, NP 02/20/2016  1:51 PM  Other:  02/20/2016  1:51 PM  Other: 02/20/2016  1:51 PM  Scribe for Treatment Team: Elaina Hoops, LCSW 02/20/2016 1:51 PM

## 2016-02-20 NOTE — Progress Notes (Signed)
D: Pt. is up and visible in the milieu, watching TV alone. Denies having any SI/HI/AVH/Pain at this time. Pt. presents with a depressed and flat affect and mood. Pt. states that he gets "frustrated with this tense feeling in my upper arms and shoulders". Pt. has mild bilat. tremors. Pt. was given opportunity to discuss feelings and encourage to pass on complaint to treatment team in the a.m.   A: Encouragement and support given. Pt. BS @ bedtime was 173. Meds. ordered and given. PRN Trazodone and Visteril requested and given. Will re-eval as necessary.   R: Safety maintained with Q 15 checks. Continues to follow treatment plan and will monitor closely.

## 2016-02-20 NOTE — Progress Notes (Signed)
Writer reviewed DM recommendation with Nanine Means, NP. NP gave verbal order to writer to modify meal coverage Novolog insulin to 15 u. Order modified by this Clinical research associate and witness by Foy Gu[place], RN., for second opinion.

## 2016-02-20 NOTE — Progress Notes (Signed)
[facility] MD Progress Note  02/20/2016 1:29 PM Tony Cunningham  MRN:  [id number] Subjective:  Patient reporting feeling better and denies hearing voices today.Less upper extremity tremors and neck stiffness.  Objective:  I have discussed case with treatment team and have met with patient. AM nurse reports that patient has had no complaints. Says he is nervous about his discharge tomorrow even though he is looking forwards to it  Principal Problem: MDD (major depressive disorder), recurrent, severe, with psychosis ([place]) Diagnosis:   Patient Active Problem List   Diagnosis Date Noted  . MDD (major depressive disorder), recurrent, severe, with psychosis ([place]) [F33.3] 02/07/2016  . Sinus complaint [R09.89] 12/25/2010  . Diabetes mellitus [250] 12/25/2010  . Depression [F32.9] 12/25/2010  . Anxiety [F41.9] 12/25/2010  . High blood pressure [I10] 12/25/2010   Total Time spent with patient: 30 minutes   Past Psychiatric History: see HPI  Past Medical History:  Past Medical History:  Diagnosis Date  . Anxiety   . Asthma   . Diabetes mellitus   . High blood pressure   . Sinus complaint    History reviewed. No pertinent surgical history. Family History: History reviewed. No pertinent family history. Family Psychiatric  History: see HPI Social History:  History  Alcohol Use No     History  Drug Use No    Social History   Social History  . Marital status: Single    Spouse name: [name]  . Number of children: [name]  . Years of education: [name]   Social History Main Topics  . Smoking status: Current Every Day Smoker    Packs/day: 0.50    Types: Cigarettes  . Smokeless tobacco: Never Used  . Alcohol use No  . Drug use: No  . Sexual activity: Not Currently   Other Topics Concern  . None   Social History Narrative  . None   Additional Social History:    Pain Medications: Ibuprofen Prescriptions: see MAR Over the Counter: see MAR History of alcohol / drug use?: No history of alcohol /  drug abuse      Sleep:   Good   Appetite:  Good  Current Medications: Current Facility-Administered Medications  Medication Dose Route Frequency Provider Last Rate Last Dose  . acetaminophen (TYLENOL) tablet 650 mg  650 mg Oral Q6H PRN Nanci Pina, FNP   650 mg at 02/18/16 1717  . alum & mag hydroxide-simeth (MAALOX/MYLANTA) 200-200-20 MG/5ML suspension 30 mL  30 mL Oral Q4H PRN Nanci Pina, FNP      . ARIPiprazole (ABILIFY) tablet 10 mg  10 mg Oral Daily Jenne Campus, MD   10 mg at 02/20/16 0829  . benztropine (COGENTIN) tablet 0.5 mg  0.5 mg Oral BID PRN Sueanne Margarita, MD   0.5 mg at 02/18/16 1209  . benztropine (COGENTIN) tablet 1 mg  1 mg Oral BID Sueanne Margarita, MD   1 mg at 02/20/16 0829  . canagliflozin (INVOKANA) tablet 300 mg  300 mg Oral QAC breakfast Lurena Nida, NP   300 mg at 02/20/16 5993  . diclofenac sodium (VOLTAREN) 1 % transdermal gel 2 g  2 g Topical BID Kerrie Buffalo, NP   2 g at 02/20/16 0829  . lisinopril (PRINIVIL,ZESTRIL) tablet 20 mg  20 mg Oral Daily Ursula Alert, MD   20 mg at 02/20/16 5701   And  . hydrochlorothiazide (HYDRODIURIL) tablet 25 mg  25 mg Oral Daily Ursula Alert, MD   25 mg at 02/20/16  2992  . hydrOXYzine (ATARAX/VISTARIL) tablet 25 mg  25 mg Oral TID PRN Nanci Pina, FNP   25 mg at 02/20/16 0645  . Influenza vac split quadrivalent PF (FLUARIX) injection 0.5 mL  0.5 mL Intramuscular Tomorrow-1000 Saramma Eappen, MD      . insulin aspart (novoLOG) injection 0-20 Units  0-20 Units Subcutaneous TID WC Lurena Nida, NP   4 Units at 02/20/16 1219  . insulin aspart (novoLOG) injection 0-5 Units  0-5 Units Subcutaneous QHS Lurena Nida, NP   2 Units at 02/17/16 2129  . insulin aspart (novoLOG) injection 10 Units  10 Units Subcutaneous TID WC Encarnacion Slates, NP   10 Units at 02/20/16 1220  . insulin glargine (LANTUS) injection 22 Units  22 Units Subcutaneous QHS Encarnacion Slates, NP   22 Units at 02/19/16 2253  . magnesium hydroxide  (MILK OF MAGNESIA) suspension 30 mL  30 mL Oral Daily PRN Nanci Pina, FNP      . metFORMIN (GLUCOPHAGE) tablet 500 mg  500 mg Oral BID WC Lurena Nida, NP   500 mg at 02/20/16 4268  . montelukast (SINGULAIR) tablet 10 mg  10 mg Oral QHS Lurena Nida, NP   10 mg at 02/19/16 2253  . nicotine (NICODERM CQ - dosed in mg/24 hours) patch 21 mg  21 mg Transdermal Daily Ursula Alert, MD   21 mg at 02/20/16 0829  . pregabalin (LYRICA) capsule 100 mg  100 mg Oral TID Lurena Nida, NP   100 mg at 02/20/16 1217  . simvastatin (ZOCOR) tablet 40 mg  40 mg Oral QHS Lurena Nida, NP   40 mg at 02/19/16 2253  . traZODone (DESYREL) tablet 100 mg  100 mg Oral QHS PRN Jenne Campus, MD   100 mg at 02/19/16 2253  . venlafaxine XR (EFFEXOR-XR) 24 hr capsule 187.5 mg  187.5 mg Oral Q breakfast Sueanne Margarita, MD   187.5 mg at 02/20/16 3419    Lab Results:  Results for orders placed or performed during the hospital encounter of 02/07/16 (from the past 48 hour(s))  Glucose, capillary     Status: Abnormal   Collection Time: 02/18/16  5:00 PM  Result Value Ref Range   Glucose-Capillary 225 (H) 65 - 99 mg/dL   Comment 1 Notify RN   Glucose, capillary     Status: Abnormal   Collection Time: 02/18/16  8:14 PM  Result Value Ref Range   Glucose-Capillary 165 (H) 65 - 99 mg/dL  Glucose, capillary     Status: Abnormal   Collection Time: 02/19/16  6:26 AM  Result Value Ref Range   Glucose-Capillary 193 (H) 65 - 99 mg/dL  Glucose, capillary     Status: Abnormal   Collection Time: 02/19/16 11:48 AM  Result Value Ref Range   Glucose-Capillary 220 (H) 65 - 99 mg/dL  Glucose, capillary     Status: Abnormal   Collection Time: 02/19/16 12:02 PM  Result Value Ref Range   Glucose-Capillary 181 (H) 65 - 99 mg/dL   Comment 1 Notify RN   Glucose, capillary     Status: Abnormal   Collection Time: 02/19/16  5:20 PM  Result Value Ref Range   Glucose-Capillary 351 (H) 65 - 99 mg/dL   Comment 1 Notify RN    Glucose, capillary     Status: Abnormal   Collection Time: 02/19/16  9:09 PM  Result Value Ref Range   Glucose-Capillary 173 (H) 65 -  99 mg/dL  Glucose, capillary     Status: Abnormal   Collection Time: 02/20/16  6:15 AM  Result Value Ref Range   Glucose-Capillary 150 (H) 65 - 99 mg/dL  Glucose, capillary     Status: Abnormal   Collection Time: 02/20/16 12:16 PM  Result Value Ref Range   Glucose-Capillary 185 (H) 65 - 99 mg/dL   Comment 1 Notify RN     Blood Alcohol level:  No results found for: [facility]  Metabolic Disorder Labs: Lab Results  Component Value Date   HGBA1C 10.0 (H) 02/09/2016   MPG 240 02/09/2016   Lab Results  Component Value Date   PROLACTIN 3.0 (L) 02/08/2016   Lab Results  Component Value Date   CHOL 197 02/08/2016   TRIG 269 (H) 02/08/2016   HDL 44 02/08/2016   CHOLHDL 4.5 02/08/2016   VLDL 54 (H) 02/08/2016   LDLCALC 99 02/08/2016    Physical Findings: AIMS: Facial and Oral Movements Muscles of Facial Expression: None, normal Lips and Perioral Area: None, normal Jaw: None, normal Tongue: None, normal,Extremity Movements Upper (arms, wrists, hands, fingers): Minimal Lower (legs, knees, ankles, toes): None, normal, Trunk Movements Neck, shoulders, hips: None, normal, Overall Severity Severity of abnormal movements (highest score from questions above): Minimal Incapacitation due to abnormal movements: None, normal Patient's awareness of abnormal movements (rate only patient's report): Aware, mild distress, Dental Status Current problems with teeth and/or dentures?: No Does patient usually wear dentures?: No  CIWA:    COWS:     Musculoskeletal: Strength & Muscle Tone: within normal limits Gait & Station: normal Patient leans: [name]  Psychiatric Specialty Exam: Physical Exam  Nursing note and vitals reviewed.   ROS denies headache, denies chest pain, no shortness of breath, describes neck and lower back pain, discomfort   Blood pressure  108/61, pulse 87, temperature 98.4 F (36.9 C), temperature source Oral, resp. rate 18, height 6' (1.829 m), weight 84.8 kg (187 lb), SpO2 98 %.Body mass index is 25.36 kg/m.  General Appearance:  Grooming has tended to improve   Eye Contact:  Good  Speech:  Normal Rate  Volume:  Normal  Mood:   Euthymic, brightens upon approach  Affect:   improving, more reactive congruent  Thought Process:  Goal Directed  Orientation:  Full (Time, Place, and Person)  Thought Content:   No hallucinations reported today  Suicidal Thoughts:  At this time denies any plan or intention of hurting self or of suicide and contracts for safety on unit   Homicidal Thoughts:  No denies any violent or homicidal ideations   Memory:  Immediate;   Good Recent;   Good Remote;   Good  Judgement:  Improving   Insight:   Improving   Psychomotor Activity:  Normal  Concentration:  Concentration: Good and Attention Span: Good  Recall:  Good  Fund of Knowledge:  Good  Language:  Good  Akathisia:  Negative  Handed:  Right  AIMS (if indicated):     Assets:  Desire for Improvement Resilience  ADL's:  Intact  Cognition:  WNL  Sleep:  Number of Hours: 6    Assessment - Patient continues to improve gradually but significantly compared to his admission presentation - mood is improving , affect is more reactive, he is less avoidant and more visible in day room, grooming is improved, hallucinations have resolved. At this time treatment plan continues to be for patient to go to a Group Home on discharge. Thus far tolerating medications well .  Treatment Plan Summary: Daily contact with patient to assess and evaluate symptoms and progress in treatment and Medication management  Decrease venlafaxine to 187.5 daily Increase  cogentin to 65m bid Encourage group and milieu participation to work on coping skills and symptom reduction  Continue  Abilify  10 mgrs QDAY to for  mood and psychotic symptoms Continue   Effexor XR   225 mgrs QAM for ongoing depression  Continue Trazodone  100 mgrs QHS PRN for insomnia  Continue Vistaril PRNs for anxiety as needed  Continue DM management  Treatment team working on disposition options -  Patient is requesting   being referred for group home placement:  CJenkinsworking on PASSR/ other forms needed for this disposition process .   URuffin Frederick MD [facility]9/28/2017, 1:29 PM

## 2016-02-21 LAB — GLUCOSE, CAPILLARY: Glucose-Capillary: 129 mg/dL — ABNORMAL HIGH (ref 65–99)

## 2016-02-21 MED ORDER — INSULIN ASPART 100 UNIT/ML ~~LOC~~ SOLN: 15.0000 [IU] | Freq: Three times a day (TID) | SUBCUTANEOUS | 0 refills | Status: DC

## 2016-02-21 MED ORDER — SIMVASTATIN 40 MG PO TABS: 40.0000 mg | ORAL_TABLET | Freq: Every day | ORAL | 0 refills | Status: DC

## 2016-02-21 MED ORDER — LISINOPRIL-HYDROCHLOROTHIAZIDE 20-25 MG PO TABS: 1.0000 | ORAL_TABLET | Freq: Every day | ORAL | 0 refills | Status: DC

## 2016-02-21 MED ORDER — ARIPIPRAZOLE 10 MG PO TABS: 10.0000 mg | ORAL_TABLET | Freq: Every day | ORAL | 0 refills | Status: DC

## 2016-02-21 MED ORDER — DICLOFENAC SODIUM 1 % TD GEL: 2.0000 g | Freq: Two times a day (BID) | TRANSDERMAL | Status: DC

## 2016-02-21 MED ORDER — LISINOPRIL-HYDROCHLOROTHIAZIDE 20-25 MG PO TABS: 1.0000 | ORAL_TABLET | Freq: Every day | ORAL | Status: DC

## 2016-02-21 MED ORDER — TRAZODONE HCL 100 MG PO TABS: 100.0000 mg | ORAL_TABLET | Freq: Every evening | ORAL | 0 refills | Status: DC | PRN

## 2016-02-21 MED ORDER — METFORMIN HCL 500 MG PO TABS: 500.0000 mg | ORAL_TABLET | Freq: Two times a day (BID) | ORAL | Status: DC

## 2016-02-21 MED ORDER — MONTELUKAST SODIUM 10 MG PO TABS: 10.0000 mg | ORAL_TABLET | Freq: Every day | ORAL | Status: DC

## 2016-02-21 MED ORDER — NICOTINE 21 MG/24HR TD PT24: 21.0000 mg | MEDICATED_PATCH | Freq: Every day | TRANSDERMAL | 0 refills | Status: DC

## 2016-02-21 MED ORDER — PREGABALIN 75 MG PO CAPS: 75.0000 mg | ORAL_CAPSULE | Freq: Two times a day (BID) | ORAL | Status: DC

## 2016-02-21 MED ORDER — VENLAFAXINE HCL ER 37.5 MG PO CP24: 187.5000 mg | ORAL_CAPSULE | Freq: Every day | ORAL | 0 refills | Status: DC

## 2016-02-21 MED ORDER — CANAGLIFLOZIN 300 MG PO TABS: 300.0000 mg | ORAL_TABLET | Freq: Every day | ORAL | 0 refills | Status: DC

## 2016-02-21 MED ORDER — PREGABALIN 75 MG PO CAPS: 75.0000 mg | ORAL_CAPSULE | Freq: Two times a day (BID) | ORAL | 0 refills | Status: DC

## 2016-02-21 MED ORDER — MONTELUKAST SODIUM 10 MG PO TABS: 10.0000 mg | ORAL_TABLET | Freq: Every day | ORAL | 0 refills | Status: DC

## 2016-02-21 MED ORDER — METFORMIN HCL 500 MG PO TABS: 500.0000 mg | ORAL_TABLET | Freq: Two times a day (BID) | ORAL | 0 refills | Status: DC

## 2016-02-21 MED ORDER — INSULIN GLARGINE 100 UNIT/ML ~~LOC~~ SOLN: 22.0000 [IU] | Freq: Every day | SUBCUTANEOUS | 0 refills | Status: DC

## 2016-02-21 MED ORDER — BENZTROPINE MESYLATE 1 MG PO TABS: 1.0000 mg | ORAL_TABLET | Freq: Two times a day (BID) | ORAL | 0 refills | Status: DC

## 2016-02-21 MED ORDER — HYDROXYZINE HCL 25 MG PO TABS: 25.0000 mg | ORAL_TABLET | Freq: Three times a day (TID) | ORAL | 0 refills | Status: DC | PRN

## 2016-02-21 NOTE — Discharge Summary (Signed)
Physician Discharge Summary Note  Patient:  Tony Cunningham is an 40 y.o., male MRN:  [id number] DOB:  1975/09/09 Patient phone:  [phone] (home)  Patient address:   [address] [place] [address],  Total Time spent with patient: Greater than 30 minutes  Date of Admission:  02/07/2016  Date of Discharge: 02-21-16  Reason for Admission: Suicidal thoughts with plans to overdose.  Principal Problem: MDD (major depressive disorder), recurrent, severe, with psychosis [facility])  Discharge Diagnoses: Patient Active Problem List   Diagnosis Date Noted  . MDD (major depressive disorder), recurrent, severe, with psychosis ([facility]) [F33.3] 02/07/2016  . Sinus complaint [R09.89] 12/25/2010  . Diabetes mellitus [250] 12/25/2010  . Depression [F32.9] 12/25/2010  . Anxiety [F41.9] 12/25/2010  . High blood pressure [I10] 12/25/2010   Past Psychiatric History: Major depression  Past Medical History:  Past Medical History:  Diagnosis Date  . Anxiety   . Asthma   . Diabetes mellitus   . High blood pressure   . Sinus complaint    History reviewed. No pertinent surgical history.  Family History: History reviewed. No pertinent family history.  Family Psychiatric  History: See H&P  Social History:  History  Alcohol Use No     History  Drug Use No    Social History   Social History  . Marital status: Single    Spouse name: [name]  . Number of children: [name]  . Years of education: [name]   Social History Main Topics  . Smoking status: Current Every Day Smoker    Packs/day: 0.50    Types: Cigarettes  . Smokeless tobacco: Never Used  . Alcohol use No  . Drug use: No  . Sexual activity: Not Currently   Other Topics Concern  . None   Social History Narrative  . None   Hospital Course: Monico Diazis an 40 y.o.malewhopresents to hospital with complaints of suicidal ideations with plan to overdose on "a handful of medication". These thoughts started 1.5 days ago. He states  that he has a history of depression and it has been increasing lately. He has a history of inpatient admissions but could not provide dates. He states that he has been hearing voices as well that "sound like mumbling". Pt was flat and depressed with slow and soft speech. He states that he is having a hard time concentrating and it is difficult for him to talk right now.    Estiven was admitted to th [facility] adult unit with complaints of worsening symptoms of depression triggering auditory hallucinations & suicidal ideations with plans to overdose. He did not cite any related stressors as the trigger. He does have have hx of chronic depression. Codey was in need of mood stabilization treatments. During the course of his treatment, he was medicated & discharged on, Abilify for mood control, Benztropine 1 mg for EPS, Hydroxyzine 25 mg prn for anxiety, Nicotine patch 21 mg for smoking cessation, Effexor 187.5 mg for depression & Trazodone 100 mg for insomnia. He was enrolled & participated in the group counseling sessions being offered & held on this unit. He was counseled & learned coping skills that should help him cope better & maintain mood stability after discharge. He was resumed on all her pertinent home medications for the other previously existing medical issues that he presented. He tolerated her treatment regimen without any adverse effects reported.   While his treatment was on going, Alexi's improvement was monitored by observation & his daily reports of  symptom reduction noted.  His emotional & mental status were monitored by daily self-inventory reports completed by him & the clinical staff. He was evaluated daily by the treatment team for mood stability & the need for continued recovery after discharge. His motivation was an integral factor in his recovery & mood stability. he was offered further treatment options upon discharge & will follow up with the outpatient psychiatric services as listed below.      Upon discharge, Devern was both mentally & medically stable for discharge. He is currently denying suicidal, homicidal ideation, auditory, visual/tactile hallucinations, delusional thoughts & or paranoia. He left [facility] with all personal belongings in no apparent distress. Transportation his group home staff.  Physical Findings: AIMS: Facial and Oral Movements Muscles of Facial Expression: None, normal Lips and Perioral Area: None, normal Jaw: None, normal Tongue: None, normal,Extremity Movements Upper (arms, wrists, hands, fingers): Minimal Lower (legs, knees, ankles, toes): None, normal, Trunk Movements Neck, shoulders, hips: None, normal, Overall Severity Severity of abnormal movements (highest score from questions above): None, normal Incapacitation due to abnormal movements: None, normal Patient's awareness of abnormal movements (rate only patient's report): Aware, no distress, Dental Status Current problems with teeth and/or dentures?: No Does patient usually wear dentures?: No  CIWA:    COWS:     Musculoskeletal: Strength & Muscle Tone: within normal limits Gait & Station: normal Patient leans: [name]  Psychiatric Specialty Exam: Physical Exam  Constitutional: He appears well-developed.  HENT:  Head: Normocephalic.  Eyes: Pupils are equal, round, and reactive to light.  Neck: Normal range of motion.  Cardiovascular: Normal rate.   Respiratory: Effort normal.  GI: Soft.  Genitourinary:  Genitourinary Comments: Denies any issues in this area  Musculoskeletal: Normal range of motion.  Neurological: He is alert.  Skin: Skin is warm.    Review of Systems  Constitutional: Negative.   HENT: Negative.   Eyes: Negative.   Respiratory: Negative.   Cardiovascular: Negative.   Gastrointestinal: Negative.   Genitourinary: Negative.   Musculoskeletal: Negative.   Skin: Negative.   Neurological: Negative.   Endo/Heme/Allergies: Negative.   Psychiatric/Behavioral: Positive  for depression (Stable). Negative for hallucinations, memory loss, substance abuse and suicidal ideas. The patient has insomnia (Stable). The patient is not nervous/anxious.     Blood pressure 102/66, pulse 75, temperature 97.7 F (36.5 C), temperature source Oral, resp. rate 16, height 6' (1.829 m), weight 84.8 kg (187 lb), SpO2 98 %.Body mass index is 25.36 kg/m.  See Md's SRA   Have you used any form of tobacco in the last 30 days? (Cigarettes, Smokeless Tobacco, Cigars, and/or Pipes): Yes  Has this patient used any form of tobacco in the last 30 days? (Cigarettes, Smokeless Tobacco, Cigars, and/or Pipes):Yes, provided with nicotine patch prescription.  Blood Alcohol level:  No results found for: [facility]  Metabolic Disorder Labs:  Lab Results  Component Value Date   HGBA1C 10.0 (H) 02/09/2016   MPG 240 02/09/2016   Lab Results  Component Value Date   PROLACTIN 3.0 (L) 02/08/2016   Lab Results  Component Value Date   CHOL 197 02/08/2016   TRIG 269 (H) 02/08/2016   HDL 44 02/08/2016   CHOLHDL 4.5 02/08/2016   VLDL 54 (H) 02/08/2016   LDLCALC 99 02/08/2016    See Psychiatric Specialty Exam and Suicide Risk Assessment completed by Attending Physician prior to discharge.  Discharge destination:  Home  Is patient on multiple antipsychotic therapies at discharge:  No   Has Patient had three  or more failed trials of antipsychotic monotherapy by history:  No  Recommended Plan for Multiple Antipsychotic Therapies: NA    Medication List    STOP taking these medications   aspirin 81 MG tablet   cyclobenzaprine 10 MG tablet Commonly known as:  FLEXERIL   fenofibrate 145 MG tablet Commonly known as:  TRICOR   fluticasone 50 MCG/ACT nasal spray Commonly known as:  FLONASE   glipiZIDE 10 MG tablet Commonly known as:  GLUCOTROL   mirtazapine 15 MG tablet Commonly known as:  REMERON   NOVOLIN R 100 units/mL injection Generic drug:  insulin regular   omeprazole 20 MG  capsule Commonly known as:  PRILOSEC   pioglitazone 15 MG tablet Commonly known as:  ACTOS   potassium chloride 10 MEQ tablet Commonly known as:  K-DUR,KLOR-CON   PRISTIQ 50 MG 24 hr tablet Generic drug:  desvenlafaxine   VICTOZA 18 MG/3ML Sopn Generic drug:  Liraglutide   VITAMIN D2 PO     TAKE these medications     Indication  ARIPiprazole 10 MG tablet Commonly known as:  ABILIFY Take 1 tablet (10 mg total) by mouth daily. For mood control Start taking on:  02/22/2016  Indication:  Mood control   benztropine 1 MG tablet Commonly known as:  COGENTIN Take 1 tablet (1 mg total) by mouth 2 (two) times daily. For prevention of drug induced tremors  Indication:  Extrapyramidal Reaction caused by Medications   canagliflozin 300 MG Tabs tablet Commonly known as:  INVOKANA Take 1 tablet (300 mg total) by mouth daily before breakfast. For diabetes management Start taking on:  02/22/2016  Indication:  Type 2 Diabetes   diclofenac sodium 1 % Gel Commonly known as:  VOLTAREN Apply 2 g topically 2 (two) times daily. For arthritic pain  Indication:  Joint Damage causing Pain and Loss of Function   hydrOXYzine 25 MG tablet Commonly known as:  ATARAX/VISTARIL Take 1 tablet (25 mg total) by mouth 3 (three) times daily as needed for anxiety.  Indication:  Anxiety Neurosis   insulin aspart 100 UNIT/ML injection Commonly known as:  novoLOG Inject 15 Units into the skin 3 (three) times daily with meals. For diabetes management  Indication:  Type 2 Diabetes   insulin glargine 100 UNIT/ML injection Commonly known as:  LANTUS Inject 0.22 mLs (22 Units total) into the skin at bedtime. For diabetes management  Indication:  Type 2 Diabetes   lisinopril-hydrochlorothiazide 20-25 MG tablet Commonly known as:  PRINZIDE,ZESTORETIC Take 1 tablet by mouth daily. For high blood pressure What changed:  additional instructions  Indication:  High Blood Pressure Disorder   metFORMIN 500 MG  tablet Commonly known as:  GLUCOPHAGE Take 1 tablet (500 mg total) by mouth 2 (two) times daily with a meal. For diabetes management  Indication:  Type 2 Diabetes   montelukast 10 MG tablet Commonly known as:  SINGULAIR Take 1 tablet (10 mg total) by mouth at bedtime. For Asthma  Indication:  Asthma   nicotine 21 mg/24hr patch Commonly known as:  NICODERM CQ - dosed in mg/24 hours Place 1 patch (21 mg total) onto the skin daily. For smoking cessation Start taking on:  02/22/2016  Indication:  Nicotine Addiction   pregabalin 75 MG capsule Commonly known as:  LYRICA Take 1 capsule (75 mg total) by mouth 2 (two) times daily. For pain What changed:  additional instructions  Indication:  Neuropathic Pain   simvastatin 40 MG tablet Commonly known as:  ZOCOR Take 1  tablet (40 mg total) by mouth at bedtime. For high cholesterol What changed:  additional instructions  Indication:  Inherited Heterozygous Hypercholesterolemia   traZODone 100 MG tablet Commonly known as:  DESYREL Take 1 tablet (100 mg total) by mouth at bedtime as needed for sleep.  Indication:  Trouble Sleeping   venlafaxine XR 37.5 MG 24 hr capsule Commonly known as:  EFFEXOR-XR Take 5 capsules (187.5 mg total) by mouth daily with breakfast. For depression Start taking on:  02/22/2016  Indication:  Major Depressive Disorder      Follow-up Information    [facility] .   Why:  Pleas use the walk-in clinic (open M-F 8am-3pm) to be established with medication management and therapy. Contact information: [address] [place] [address] [phone]          Follow-up recommendations: Activity:  As tolerated Diet: As recommended by your primary care doctor. Keep all scheduled follow-up appointments as recommended.  Comments: Patient is instructed prior to discharge to: Take all medications as prescribed by his/her mental healthcare provider. Report any adverse effects and or reactions  from the medicines to his/her outpatient provider promptly. Patient has been instructed & cautioned: To not engage in alcohol and or illegal drug use while on prescription medicines. In the event of worsening symptoms, patient is instructed to call the crisis hotline, 911 and or go to the nearest ED for appropriate evaluation and treatment of symptoms. To follow-up with his/her primary care provider for your other medical issues, concerns and or health care needs.   Signed: Sanjuana Kava, NP, PMHNP, FNP-BC 02/21/2016, 9:33 AM

## 2016-02-21 NOTE — [facility] Suicide Risk Assessment (Signed)
[facility] INPATIENT:  Family/Significant Other Suicide Prevention Education  Suicide Prevention Education:  Contact Attempts: [name], Pt's mother [phone],  has been identified by the patient as the family member/significant other with whom the patient will be residing, and identified as the person(s) who will aid the patient in the event of a mental health crisis.  With written consent from the patient, two attempts were made to provide suicide prevention education, prior to and/or following the patient's discharge.  We were unsuccessful in providing suicide prevention education.  A suicide education pamphlet was given to the patient to share with family/significant other.  Date and time of first attemptL: 02/20/16 @ 1:00pm Date and time of second attempt: 02/21/16 @ 10:30am  Tony Cunningham 02/21/2016, 10:29 AM

## 2016-02-21 NOTE — [place] FL2 (Signed)
[place] MEDICAID FL2 LEVEL OF CARE SCREENING TOOL     IDENTIFICATION  Patient Name: Tony Cunningham Birthdate: [birth year] Sex: male Admission Date (Current Location): 02/07/2016  [facility] and [place] Number:  [name] [id number] Facility and Address:   [facility], [address], [place]  [address])      Provider Number: [id number]  Attending Physician Name and Address:  Jomarie Longs, MD  Relative Name and Phone Number:       Current Level of Care: Hospital Recommended Level of Care: Assisted Living Facility, [facility] Prior Approval Number:    Date Approved/Denied: 02/17/16 PASRR Number: [id number]  Discharge Plan: Domiciliary (Rest home) [facility]   Current Diagnoses: Patient Active Problem List   Diagnosis Date Noted  . MDD (major depressive disorder), recurrent, severe, with psychosis (HCC) 02/07/2016  . Sinus complaint 12/25/2010  . Diabetes mellitus 12/25/2010  . Depression 12/25/2010  . Anxiety 12/25/2010  . High blood pressure 12/25/2010    Orientation RESPIRATION BLADDER Height & Weight     Self, Time, Situation, Place (Oriented x4)  Normal Continent Weight: 187 lb (84.8 kg) Height:  6' (182.9 cm)  BEHAVIORAL SYMPTOMS/MOOD NEUROLOGICAL BOWEL NUTRITION STATUS   (WNL)   Continent Diet ((with diabetic accomodations))  AMBULATORY STATUS COMMUNICATION OF NEEDS Skin   Independent Verbally                         Personal Care Assistance Level of Assistance  Bathing, Feeding, Dressing, Total care Bathing Assistance: Independent Feeding assistance: Independent Dressing Assistance: Independent Total Care Assistance: Independent   Functional Limitations Info  Sight, Hearing, Speech Sight Info: Adequate Hearing Info: Adequate Speech Info: Adequate    SPECIAL CARE FACTORS FREQUENCY                       Contractures Contractures Info: Not present    Additional Factors Info  Psychotropic, Allergies    Allergies Info: Seroquel Psychotropic Info: see below         Current Medications (02/21/2016):  This is the current hospital active medication list Current Facility-Administered Medications  Medication Dose Route Frequency Provider Last Rate Last Dose  . acetaminophen (TYLENOL) tablet 650 mg  650 mg Oral Q6H PRN Truman Hayward, FNP   650 mg at 02/18/16 1717  . alum & mag hydroxide-simeth (MAALOX/MYLANTA) 200-200-20 MG/5ML suspension 30 mL  30 mL Oral Q4H PRN Truman Hayward, FNP      . ARIPiprazole (ABILIFY) tablet 10 mg  10 mg Oral Daily Craige Cotta, MD   10 mg at 02/21/16 0842  . benztropine (COGENTIN) tablet 0.5 mg  0.5 mg Oral BID PRN Molinda Bailiff, MD   0.5 mg at 02/18/16 1209  . benztropine (COGENTIN) tablet 1 mg  1 mg Oral BID Molinda Bailiff, MD   1 mg at 02/21/16 0842  . canagliflozin (INVOKANA) tablet 300 mg  300 mg Oral QAC breakfast Kristeen Mans, NP   300 mg at 02/21/16 0636  . diclofenac sodium (VOLTAREN) 1 % transdermal gel 2 g  2 g Topical BID Adonis Brook, NP   2 g at 02/20/16 0829  . lisinopril (PRINIVIL,ZESTRIL) tablet 20 mg  20 mg Oral Daily Jomarie Longs, MD   20 mg at 02/21/16 0842   And  . hydrochlorothiazide (HYDRODIURIL) tablet 25 mg  25 mg Oral Daily Jomarie Longs, MD   25 mg at 02/21/16 0842  . hydrOXYzine (  ATARAX/VISTARIL) tablet 25 mg  25 mg Oral TID PRN Truman Hayward, FNP   25 mg at 02/20/16 2156  . Influenza vac split quadrivalent PF (FLUARIX) injection 0.5 mL  0.5 mL Intramuscular Tomorrow-1000 Saramma Eappen, MD      . insulin aspart (novoLOG) injection 0-20 Units  0-20 Units Subcutaneous TID WC Kristeen Mans, NP   3 Units at 02/21/16 [phone]  . insulin aspart (novoLOG) injection 0-5 Units  0-5 Units Subcutaneous QHS Kristeen Mans, NP   2 Units at 02/20/16 2152  . insulin aspart (novoLOG) injection 15 Units  15 Units Subcutaneous TID WC Charm Rings, NP   15 Units at 02/21/16 [phone]  . insulin glargine (LANTUS) injection 22 Units  22 Units  Subcutaneous QHS Sanjuana Kava, NP   22 Units at 02/20/16 2153  . magnesium hydroxide (MILK OF MAGNESIA) suspension 30 mL  30 mL Oral Daily PRN Truman Hayward, FNP      . metFORMIN (GLUCOPHAGE) tablet 500 mg  500 mg Oral BID WC Kristeen Mans, NP   500 mg at 02/21/16 0842  . montelukast (SINGULAIR) tablet 10 mg  10 mg Oral QHS Kristeen Mans, NP   10 mg at 02/20/16 2156  . nicotine (NICODERM CQ - dosed in mg/24 hours) patch 21 mg  21 mg Transdermal Daily Jomarie Longs, MD   21 mg at 02/20/16 0829  . pregabalin (LYRICA) capsule 100 mg  100 mg Oral TID Kristeen Mans, NP   100 mg at 02/21/16 0845  . simvastatin (ZOCOR) tablet 40 mg  40 mg Oral QHS Kristeen Mans, NP   40 mg at 02/20/16 2156  . traZODone (DESYREL) tablet 100 mg  100 mg Oral QHS PRN Craige Cotta, MD   100 mg at 02/20/16 2156  . venlafaxine XR (EFFEXOR-XR) 24 hr capsule 187.5 mg  187.5 mg Oral Q breakfast Molinda Bailiff, MD   187.5 mg at 02/21/16 0841     Discharge Medications: TAKE these medications     Indication  ARIPiprazole 10 MG tablet Commonly known as:  ABILIFY Take 1 tablet (10 mg total) by mouth daily. For mood control Start taking on:  02/22/2016 Indication:  Mood control  benztropine 1 MG tablet Commonly known as:  COGENTIN Take 1 tablet (1 mg total) by mouth 2 (two) times daily. For prevention of drug induced tremors Indication:  Extrapyramidal Reaction caused by Medications  canagliflozin 300 MG Tabs tablet Commonly known as:  INVOKANA Take 1 tablet (300 mg total) by mouth daily before breakfast. For diabetes management Start taking on:  02/22/2016 Indication:  Type 2 Diabetes  diclofenac sodium 1 % Gel Commonly known as:  VOLTAREN Apply 2 g topically 2 (two) times daily. For arthritic pain Indication:  Joint Damage causing Pain and Loss of Function  hydrOXYzine 25 MG tablet Commonly known as:  ATARAX/VISTARIL Take 1 tablet (25 mg total) by mouth 3 (three) times daily as needed for anxiety. Indication:   Anxiety Neurosis  insulin aspart 100 UNIT/ML injection Commonly known as:  novoLOG Inject 15 Units into the skin 3 (three) times daily with meals. For diabetes management Indication:  Type 2 Diabetes  insulin glargine 100 UNIT/ML injection Commonly known as:  LANTUS Inject 0.22 mLs (22 Units total) into the skin at bedtime. For diabetes management Indication:  Type 2 Diabetes  lisinopril-hydrochlorothiazide 20-25 MG tablet Commonly known as:  PRINZIDE,ZESTORETIC Take 1 tablet by mouth daily. For high blood pressure What changed:  additional instructions Indication:  High Blood Pressure Disorder  metFORMIN 500 MG tablet Commonly known as:  GLUCOPHAGE Take 1 tablet (500 mg total) by mouth 2 (two) times daily with a meal. For diabetes management Indication:  Type 2 Diabetes  montelukast 10 MG tablet Commonly known as:  SINGULAIR Take 1 tablet (10 mg total) by mouth at bedtime. For Asthma Indication:  Asthma  nicotine 21 mg/24hr patch Commonly known as:  NICODERM CQ - dosed in mg/24 hours Place 1 patch (21 mg total) onto the skin daily. For smoking cessation Start taking on:  02/22/2016 Indication:  Nicotine Addiction  pregabalin 75 MG capsule Commonly known as:  LYRICA Take 1 capsule (75 mg total) by mouth 2 (two) times daily. For pain What changed:  additional instructions Indication:  Neuropathic Pain  simvastatin 40 MG tablet Commonly known as:  ZOCOR Take 1 tablet (40 mg total) by mouth at bedtime. For high cholesterol What changed:  additional instructions Indication:  Inherited Heterozygous Hypercholesterolemia  traZODone 100 MG tablet Commonly known as:  DESYREL Take 1 tablet (100 mg total) by mouth at bedtime as needed for sleep. Indication:  Trouble Sleeping  venlafaxine XR 37.5 MG 24 hr capsule Commonly known as:  EFFEXOR-XR Take 5 capsules (187.5 mg total) by mouth daily with breakfast. For depression Start taking on:  02/22/2016 Indication:  Major Depressive Disorder      Relevant Imaging Results: [name]  Relevant Lab Results:   Additional Information    Jessicalynn Deshong Lavonna Rua, LCSW

## 2016-02-21 NOTE — BHH Suicide Risk Assessment (Signed)
[facility] Discharge Suicide Risk Assessment   Principal Problem: MDD (major depressive disorder), recurrent, severe, with psychosis (HCC) Discharge Diagnoses:  Patient Active Problem List   Diagnosis Date Noted  . MDD (major depressive disorder), recurrent, severe, with psychosis (HCC) [F33.3] 02/07/2016  . Sinus complaint [R09.89] 12/25/2010  . Diabetes mellitus [250] 12/25/2010  . Depression [F32.9] 12/25/2010  . Anxiety [F41.9] 12/25/2010  . High blood pressure [I10] 12/25/2010    Total Time spent with patient: 30 minutes  Musculoskeletal: Strength & Muscle Tone: within normal limits Gait & Station: normal Patient leans: [name]  Psychiatric Specialty Exam: ROS  Blood pressure 102/66, pulse 75, temperature 97.7 F (36.5 C), temperature source Oral, resp. rate 16, height 6' (1.829 m), weight 84.8 kg (187 lb), SpO2 98 %.Body mass index is 25.36 kg/m.  General Appearance: Casual  Eye Contact::  Good  Speech:  Clear and Coherent and Normal Rate409  Volume:  Normal  Mood:  Euthymic  Affect:  Congruent  Thought Process:  Coherent and Goal Directed  Orientation:  Full (Time, Place, and Person)  Thought Content:  Logical  Suicidal Thoughts:  No  Homicidal Thoughts:  No  Memory:  Immediate;   Fair Recent;   Fair Remote;   Fair  Judgement:  Fair  Insight:  Fair  Psychomotor Activity:  Normal  Concentration:  Fair  Recall:  [employer] of Knowledge:Fair  Language: Fair  Akathisia:  No  Handed:  Right  AIMS (if indicated):     Assets:  Communication Skills Desire for Improvement Financial Resources/Insurance Housing Social Support Transportation  Sleep:  Number of Hours: 5.5  Cognition: WNL  ADL's:  Intact   Mental Status Per Nursing Assessment::   On Admission:  Suicidal ideation indicated by patient  Demographic Factors:  Male and Low socioeconomic status  Loss Factors: None  Historical Factors: Family history of suicide and Family history of mental illness or  substance abuse  Risk Reduction Factors:   Religious beliefs about death, Living with another person, especially a relative and Positive therapeutic relationship  Continued Clinical Symptoms: None   Cognitive Features That Contribute To Risk:  None    Suicide Risk:  Minimal: No identifiable suicidal ideation.  Patients presenting with no risk factors but with morbid ruminations; may be classified as minimal risk based on the severity of the depressive symptoms  Follow-up Information    [facility] .   Why:  Pleas use the walk-in clinic (open M-F 8am-3pm) to be established with medication management and therapy. Contact information: [address] [place] [address] [phone]           Plan Of Care/Follow-up recommendations:  Activity:  Patent is being referred to an assissted living facility who will supervise his care  Wynelle Bourgeois, MD 02/21/2016, 10:14 AM

## 2016-02-21 NOTE — Progress Notes (Signed)
Tony Cunningham is readied for his dc to group home this am. He completes his suicide safety plan, it is reviewed with him by Clinical research associate and cc given to him to take with him. All belongings in his locker are returned to him and suicide safety plan along with AVS along with MD Suicide Risk assessment and  MD Transition record  All reveiwed with pt these documents arre given to patient to take with him. He writes on his daily assessment he rates his depression " 6", he does not rate his anxiety and his hopelessness he rates a "5", respectively. He does say that he has had thougths of suicide today..but he says " I do this all the time./..I  know I'm ok". 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 ALl dc teaching completed and pt escorted to building entrance and dc'd per poc.Per SW , previously faxed documents for pt admission stays on chart. NP made aware of pt statements.

## 2016-02-21 NOTE — Progress Notes (Signed)
  [facility] Adult Case Management Discharge Plan :  Will you be returning to the same living situation after discharge:  No. Pt is going to [facility] At discharge, do you have transportation home?: Yes,  group home staff to provide transportation Do you have the ability to pay for your medications: Yes,  Pt provided with prescriptions  Release of information consent forms completed and in the chart;  Patient's signature needed at discharge.  Patient to Follow up at: Follow-up Information    [facility] .   Why:  Pleas use the walk-in clinic (open M-F 8am-3pm) to be established with medication management and therapy. Contact information: [address] [place] [address] [phone]           Next level of care provider has access to [facility] Link:no  Safety Planning and Suicide Prevention discussed: Yes,  with Pt; 2 unsuccessful attempts with mother  Have you used any form of tobacco in the last 30 days? (Cigarettes, Smokeless Tobacco, Cigars, and/or Pipes): Yes  Has patient been referred to the Quitline?: Patient refused referral  Patient has been referred for addiction treatment: Yes  Tony Cunningham 02/21/2016, 10:30 AM

## 2016-03-18 NOTE — Clinical Social Work Note (Addendum)
Call from Tami Lin, [facility] [phone]), re patient's need for refills on psychiatric medications.  Per Aundria Rud, pt went to [facility] in first week of October and was not scheduled by facility for medications management.  Aundria Rud states she was told to "wait for them to call w an appointment."  CSW encouraged Aundria Rud to return to [facility] and have patient seen ASAP for medications as he discharged w prescription for 30 days of meds only and this cannot be refilled.  Aundria Rud voiced understanding and also stated that she would contact [facility] if she cannot get prompt service at [facility].  Santa Genera, LCSW Lead Clinical Social Worker Phone:  [phone]

## 2016-07-05 ENCOUNTER — Emergency Department
Admission: EM | Admit: 2016-07-05 | Discharge: 2016-07-06 | Disposition: A | Discharge status: Other Acute Inpatient Hospital | Payer: Medicare Other | Attending: Emergency Medicine | Admitting: Emergency Medicine

## 2016-07-05 DIAGNOSIS — Z79899 Other long term (current) drug therapy: Secondary | ICD-10-CM | POA: Insufficient documentation

## 2016-07-05 DIAGNOSIS — F329 Major depressive disorder, single episode, unspecified: Secondary | ICD-10-CM | POA: Insufficient documentation

## 2016-07-05 DIAGNOSIS — F17210 Nicotine dependence, cigarettes, uncomplicated: Secondary | ICD-10-CM | POA: Insufficient documentation

## 2016-07-05 DIAGNOSIS — R45851 Suicidal ideations: Secondary | ICD-10-CM

## 2016-07-05 DIAGNOSIS — J45909 Unspecified asthma, uncomplicated: Secondary | ICD-10-CM | POA: Insufficient documentation

## 2016-07-05 DIAGNOSIS — F333 Major depressive disorder, recurrent, severe with psychotic symptoms: Secondary | ICD-10-CM | POA: Diagnosis not present

## 2016-07-05 DIAGNOSIS — E119 Type 2 diabetes mellitus without complications: Secondary | ICD-10-CM | POA: Diagnosis not present

## 2016-07-05 DIAGNOSIS — Z794 Long term (current) use of insulin: Secondary | ICD-10-CM | POA: Diagnosis not present

## 2016-07-05 DIAGNOSIS — R440 Auditory hallucinations: Secondary | ICD-10-CM

## 2016-07-05 LAB — COMPREHENSIVE METABOLIC PANEL
ALBUMIN: 3.9 g/dL (ref 3.5–5.0)
ALK PHOS: 57 U/L (ref 38–126)
ALT: 24 U/L (ref 17–63)
AST: 16 U/L (ref 15–41)
Anion gap: 10 (ref 5–15)
BILIRUBIN TOTAL: 1.2 mg/dL (ref 0.3–1.2)
BUN: 18 mg/dL (ref 6–20)
CO2: 28 mmol/L (ref 22–32)
CREATININE: 0.85 mg/dL (ref 0.61–1.24)
Calcium: 9.3 mg/dL (ref 8.9–10.3)
Chloride: 97 mmol/L — ABNORMAL LOW (ref 101–111)
GFR calc Af Amer: >60 mL/min (ref >60)
Glucose, Bld: 201 mg/dL — ABNORMAL HIGH (ref 65–99)
POTASSIUM: 3.9 mmol/L (ref 3.5–5.1)
SODIUM: 135 mmol/L (ref 135–145)
TOTAL PROTEIN: 6.6 g/dL (ref 6.5–8.1)

## 2016-07-05 LAB — URINE DRUG SCREEN, QUALITATIVE (ARMC ONLY)
Amphetamines, Ur Screen: NOT DETECTED
BARBITURATES, UR SCREEN: NOT DETECTED
BENZODIAZEPINE, UR SCRN: NOT DETECTED
COCAINE METABOLITE, UR ~~LOC~~: NOT DETECTED
Cannabinoid 50 Ng, Ur ~~LOC~~: NOT DETECTED
MDMA (Ecstasy)Ur Screen: NOT DETECTED
METHADONE SCREEN, URINE: NOT DETECTED
OPIATE, UR SCREEN: NOT DETECTED
PHENCYCLIDINE (PCP) UR S: NOT DETECTED
Tricyclic, Ur Screen: NOT DETECTED

## 2016-07-05 LAB — CBC
HEMATOCRIT: 46.3 % (ref 40.0–52.0)
HEMOGLOBIN: 16.8 g/dL (ref 13.0–18.0)
MCH: 31.9 pg (ref 26.0–34.0)
MCHC: 36.3 g/dL — ABNORMAL HIGH (ref 32.0–36.0)
MCV: 88 fL (ref 80.0–100.0)
Platelets: 205 10*3/uL (ref 150–440)
RBC: 5.27 MIL/uL (ref 4.40–5.90)
RDW: 13 % (ref 11.5–14.5)
WBC: 7.3 10*3/uL (ref 3.8–10.6)

## 2016-07-05 LAB — SALICYLATE LEVEL: Salicylate Lvl: <7 mg/dL (ref 2.8–30.0)

## 2016-07-05 LAB — ETHANOL

## 2016-07-05 LAB — GLUCOSE, CAPILLARY
Glucose-Capillary: 323 mg/dL — ABNORMAL HIGH (ref 65–99)
Glucose-Capillary: 392 mg/dL — ABNORMAL HIGH (ref 65–99)

## 2016-07-05 LAB — ACETAMINOPHEN LEVEL: Acetaminophen (Tylenol), Serum: <10 ug/mL — ABNORMAL LOW (ref 10–30)

## 2016-07-05 MED ORDER — SIMVASTATIN 40 MG PO TABS
40.0000 mg | ORAL_TABLET | Freq: Every day | ORAL | Status: DC
  Administered 2016-07-05: 40 mg via ORAL
  Filled 2016-07-05: qty 1

## 2016-07-05 MED ORDER — NICOTINE 21 MG/24HR TD PT24
21.0000 mg | MEDICATED_PATCH | Freq: Every day | TRANSDERMAL | Status: DC
  Administered 2016-07-05 – 2016-07-06 (×2): 21 mg via TRANSDERMAL
  Filled 2016-07-05 (×2): qty 1

## 2016-07-05 MED ORDER — CANAGLIFLOZIN 300 MG PO TABS: 300.0000 mg | ORAL_TABLET | Freq: Every day | ORAL | Status: DC

## 2016-07-05 MED ORDER — HYDROXYZINE HCL 25 MG PO TABS: 25.0000 mg | ORAL_TABLET | Freq: Three times a day (TID) | ORAL | Status: DC | PRN

## 2016-07-05 MED ORDER — INSULIN GLARGINE 100 UNIT/ML ~~LOC~~ SOLN
22.0000 [IU] | Freq: Every day | SUBCUTANEOUS | Status: DC
  Administered 2016-07-05: 22 [IU] via SUBCUTANEOUS
  Filled 2016-07-05 (×2): qty 0.22

## 2016-07-05 MED ORDER — OLANZAPINE 5 MG PO TABS
5.0000 mg | ORAL_TABLET | Freq: Two times a day (BID) | ORAL | Status: DC
  Administered 2016-07-05 – 2016-07-06 (×2): 5 mg via ORAL
  Filled 2016-07-05 (×2): qty 1

## 2016-07-05 MED ORDER — LISINOPRIL 20 MG PO TABS
20.0000 mg | ORAL_TABLET | Freq: Every day | ORAL | Status: DC
  Administered 2016-07-06: 20 mg via ORAL
  Filled 2016-07-05: qty 1

## 2016-07-05 MED ORDER — BENZTROPINE MESYLATE 1 MG PO TABS
1.0000 mg | ORAL_TABLET | Freq: Two times a day (BID) | ORAL | Status: DC
  Administered 2016-07-06 (×2): 1 mg via ORAL
  Filled 2016-07-05 (×2): qty 1

## 2016-07-05 MED ORDER — INSULIN ASPART 100 UNIT/ML ~~LOC~~ SOLN
0.0000 [IU] | Freq: Three times a day (TID) | SUBCUTANEOUS | Status: DC
  Administered 2016-07-05: 11 [IU] via SUBCUTANEOUS
  Administered 2016-07-06: 5 [IU] via SUBCUTANEOUS
  Administered 2016-07-06: 3 [IU] via SUBCUTANEOUS
  Filled 2016-07-05 (×2): qty 5
  Filled 2016-07-05: qty 11
  Filled 2016-07-05: qty 3

## 2016-07-05 MED ORDER — LORAZEPAM 2 MG PO TABS
2.0000 mg | ORAL_TABLET | Freq: Four times a day (QID) | ORAL | Status: DC | PRN
  Administered 2016-07-05 – 2016-07-06 (×2): 2 mg via ORAL
  Filled 2016-07-05 (×3): qty 1

## 2016-07-05 MED ORDER — MONTELUKAST SODIUM 10 MG PO TABS
10.0000 mg | ORAL_TABLET | Freq: Every day | ORAL | Status: DC
  Administered 2016-07-05: 10 mg via ORAL
  Filled 2016-07-05 (×2): qty 1

## 2016-07-05 MED ORDER — OLANZAPINE 5 MG PO TABS: 5.0000 mg | ORAL_TABLET | Freq: Every day | ORAL | Status: DC

## 2016-07-05 MED ORDER — HYDROCHLOROTHIAZIDE 25 MG PO TABS
25.0000 mg | ORAL_TABLET | Freq: Every day | ORAL | Status: DC
  Administered 2016-07-06: 25 mg via ORAL
  Filled 2016-07-05 (×2): qty 1

## 2016-07-05 MED ORDER — TRAZODONE HCL 100 MG PO TABS
100.0000 mg | ORAL_TABLET | Freq: Every evening | ORAL | Status: DC | PRN
  Administered 2016-07-05: 100 mg via ORAL
  Filled 2016-07-05: qty 1

## 2016-07-05 MED ORDER — LISINOPRIL-HYDROCHLOROTHIAZIDE 20-25 MG PO TABS: 1.0000 | ORAL_TABLET | Freq: Every day | ORAL | Status: DC

## 2016-07-05 NOTE — ED Notes (Signed)
Report was received Amy H,RN; Pt. Verbalizes no complaints or distress; verbalizes having S.I.; with a plan to walk into traffic; Hi. Continue to monitor with 15 min. Monitoring.

## 2016-07-05 NOTE — ED Notes (Signed)
Misty Stanley from Group Home called to give number [phone]. Call for problems. I also verified patient's home medications as well at this time and entered into the computer.
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
## 2016-07-05 NOTE — ED Provider Notes (Addendum)
[facility] Emergency Department Provider Note  Time seen: 12:39 PM  I have reviewed the triage vital signs and the nursing notes.   HISTORY  Chief Complaint Suicidal    HPI Tony Cunningham is a 41 y.o. male with a past medical history of anxiety, diabetes, high blood pressure, presents to the emergency department for suicidal ideation. According to the patient he has been hearing voices for a long time but over the past few days they have been telling him to kill himself by walking into traffic. Denies any homicidal ideation. Denies any substance uses. Patient does state he's having mild headache with pain in the back of his head which is been ongoing for the past 3 or 4 days.  Past Medical History:  Diagnosis Date  . Anxiety   . Asthma   . Diabetes mellitus   . High blood pressure   . Sinus complaint     Patient Active Problem List   Diagnosis Date Noted  . MDD (major depressive disorder), recurrent, severe, with psychosis (HCC) 02/07/2016  . Sinus complaint 12/25/2010  . Diabetes mellitus 12/25/2010  . Depression 12/25/2010  . Anxiety 12/25/2010  . High blood pressure 12/25/2010    No past surgical history on file.  Prior to Admission medications   Medication Sig Start Date End Date Taking? Authorizing Provider  ARIPiprazole (ABILIFY) 10 MG tablet Take 1 tablet (10 mg total) by mouth daily. For mood control 02/22/16   Sanjuana Kava, NP  benztropine (COGENTIN) 1 MG tablet Take 1 tablet (1 mg total) by mouth 2 (two) times daily. For prevention of drug induced tremors 02/21/16   Sanjuana Kava, NP  canagliflozin (INVOKANA) 300 MG TABS tablet Take 1 tablet (300 mg total) by mouth daily before breakfast. For diabetes management 02/22/16   Sanjuana Kava, NP  diclofenac sodium (VOLTAREN) 1 % GEL Apply 2 g topically 2 (two) times daily. For arthritic pain 02/21/16   Sanjuana Kava, NP  hydrOXYzine (ATARAX/VISTARIL) 25 MG tablet Take 1 tablet (25 mg total) by mouth  3 (three) times daily as needed for anxiety. 02/21/16   Sanjuana Kava, NP  insulin aspart (NOVOLOG) 100 UNIT/ML injection Inject 15 Units into the skin 3 (three) times daily with meals. For diabetes management 02/21/16   Sanjuana Kava, NP  insulin glargine (LANTUS) 100 UNIT/ML injection Inject 0.22 mLs (22 Units total) into the skin at bedtime. For diabetes management 02/21/16   Sanjuana Kava, NP  lisinopril-hydrochlorothiazide (PRINZIDE,ZESTORETIC) 20-25 MG tablet Take 1 tablet by mouth daily. For high blood pressure 02/21/16   Sanjuana Kava, NP  metFORMIN (GLUCOPHAGE) 500 MG tablet Take 1 tablet (500 mg total) by mouth 2 (two) times daily with a meal. For diabetes management 02/21/16   Sanjuana Kava, NP  montelukast (SINGULAIR) 10 MG tablet Take 1 tablet (10 mg total) by mouth at bedtime. For Asthma 02/21/16   Sanjuana Kava, NP  nicotine (NICODERM CQ - DOSED IN MG/24 HOURS) 21 mg/24hr patch Place 1 patch (21 mg total) onto the skin daily. For smoking cessation 02/22/16   Sanjuana Kava, NP  pregabalin (LYRICA) 75 MG capsule Take 1 capsule (75 mg total) by mouth 2 (two) times daily. For pain 02/21/16   Sanjuana Kava, NP  simvastatin (ZOCOR) 40 MG tablet Take 1 tablet (40 mg total) by mouth at bedtime. For high cholesterol 02/21/16   Sanjuana Kava, NP  traZODone (DESYREL) 100 MG tablet Take 1 tablet (  100 mg total) by mouth at bedtime as needed for sleep. 02/21/16   Sanjuana Kava, NP  venlafaxine XR (EFFEXOR-XR) 37.5 MG 24 hr capsule Take 5 capsules (187.5 mg total) by mouth daily with breakfast. For depression 02/22/16   Sanjuana Kava, NP    Allergies  Allergen Reactions  . Seroquel [Quetiapine Fumerate]     No family history on file.  Social History Social History  Substance Use Topics  . Smoking status: Current Every Day Smoker    Packs/day: 0.50    Types: Cigarettes  . Smokeless tobacco: Never Used  . Alcohol use No    Review of Systems Constitutional: Negative for fever. Cardiovascular:  Negative for chest pain. Respiratory: Negative for shortness of breath. Gastrointestinal: Negative for abdominal pain Neurological: Mild headache. Denies focal weakness or numbness. 10-point ROS otherwise negative.  ____________________________________________   PHYSICAL EXAM:  VITAL SIGNS: ED Triage Vitals  Enc Vitals Group     BP 07/05/16 1131 113/81     Pulse Rate 07/05/16 1131 (!) 106     Resp 07/05/16 1131 20     Temp 07/05/16 1131 98.8 F (37.1 C)     Temp Source 07/05/16 1131 Oral     SpO2 07/05/16 1131 95 %     Weight 07/05/16 1131 180 lb (81.6 kg)     Height 07/05/16 1131 6' (1.829 m)     Head Circumference --      Peak Flow --      Pain Score 07/05/16 1144 8     Pain Loc --      Pain Edu? --      Excl. in GC? --     Constitutional: Alert and oriented. Well appearing and in no distress. Eyes: Normal exam ENT   Head: Normocephalic and atraumatic   Mouth/Throat: Mucous membranes are moist. Cardiovascular: Normal rate, regular rhythm. No murmur Respiratory: Normal respiratory effort without tachypnea nor retractions. Breath sounds are clear Gastrointestinal: Soft and nontender. No distention. Musculoskeletal: Nontender with normal range of motion in all extremities.  Neurologic:  Normal speech and language. No gross focal neurologic deficits Skin:  Skin is warm, dry and intact.  Psychiatric: Patient endorsing suicidal ideation.  ____________________________________________   INITIAL IMPRESSION / ASSESSMENT AND PLAN / ED COURSE  Pertinent labs & imaging results that were available during my care of the patient were reviewed by me and considered in my medical decision making (see chart for details).  The patient presents to the emergency department with auditory hallucinations and suicidal ideation. Given the patient's symptoms will place under involuntary commitment and have psychiatry see the patient. Patient is on medical complaint today is a mild  headache. We will treat with Tylenol. Labs are reassuring.  Patient's labs are largely within normal limits.   Patient has been seen by psychiatry and they're recommending inpatient admission. We will dose Ativan as needed.  ____________________________________________   FINAL CLINICAL IMPRESSION(S) / ED DIAGNOSES  Auditory hallucinations Suicidal ideation    Minna Antis, MD 07/05/16 1433    Minna Antis, MD 07/05/16 1452

## 2016-07-05 NOTE — BH Assessment (Signed)
Referral information for Psychiatric Hospitalization faxed to;    [facility] [phone] or [phone]   [facility] [phone]),    [facility] [phone] or [phone]),    [address] [phone]),    [facility] [phone]),    [facility] [phone]),    [facility] [phone]).

## 2016-07-05 NOTE — ED Notes (Signed)
Pt medicated with ativan, orders updated in the computer. nicoderm patch applied.

## 2016-07-05 NOTE — ED Notes (Signed)
[facility] called and initiated [facility]

## 2016-07-05 NOTE — ED Notes (Signed)
Pt  cooperative with staff. Pt endorses intermittent SI with a plan to walk into traffic. Pt stated he was in the hospital because "I want to get my life back together." Pt stated he has not slept in 4 days. Pt confused as to why he was moved from the quad. RN explained the purpose of this unit. Pt slow to respond, often giving RN a blank stare. No concerns voiced. Maintained on 15 minute checks and observation by security camera for safety.

## 2016-07-05 NOTE — ED Triage Notes (Addendum)
BIB EMS from home. Pt reports pain in back of head, states he has trouble concentrating and remembering things for past 3-4. Pt states he has been shaking for past 3 days and has not slept in 4 days.  Pt states he feels like he is going crazy. Pt begins to cry in triage. States has been hearing voices telling him to walk out into traffic. Reports a history of depression and being on effexor. States he does not remember if he has taken his medication. States he lives in a group home. Denies alcohol or drug abuse

## 2016-07-05 NOTE — ED Notes (Signed)
ED Provider at bedside. 

## 2016-07-05 NOTE — BH Assessment (Signed)
Writer made attempts to contact patient's [employer] to get collateral information but was unsuccessful. Numbers listed in his chart are his personal cellular [phone]) and the other number is a nonworking number([phone]).

## 2016-07-06 ENCOUNTER — Encounter: Payer: Self-pay | Admitting: Psychiatry

## 2016-07-06 ENCOUNTER — Inpatient Hospital Stay
Admission: EM | Admit: 2016-07-06 | Discharge: 2016-07-17 | DRG: 885 | Disposition: A | Discharge status: Home / Self Care | Payer: Medicare Other | Source: Intra-hospital | Attending: Psychiatry | Admitting: Psychiatry

## 2016-07-06 DIAGNOSIS — F333 Major depressive disorder, recurrent, severe with psychotic symptoms: Principal | ICD-10-CM | POA: Diagnosis present

## 2016-07-06 DIAGNOSIS — R45851 Suicidal ideations: Secondary | ICD-10-CM | POA: Diagnosis present

## 2016-07-06 DIAGNOSIS — F17200 Nicotine dependence, unspecified, uncomplicated: Secondary | ICD-10-CM

## 2016-07-06 DIAGNOSIS — Z8249 Family history of ischemic heart disease and other diseases of the circulatory system: Secondary | ICD-10-CM

## 2016-07-06 DIAGNOSIS — Z79899 Other long term (current) drug therapy: Secondary | ICD-10-CM

## 2016-07-06 DIAGNOSIS — I10 Essential (primary) hypertension: Secondary | ICD-10-CM | POA: Diagnosis present

## 2016-07-06 DIAGNOSIS — G4700 Insomnia, unspecified: Secondary | ICD-10-CM | POA: Diagnosis present

## 2016-07-06 DIAGNOSIS — F259 Schizoaffective disorder, unspecified: Secondary | ICD-10-CM | POA: Diagnosis not present

## 2016-07-06 DIAGNOSIS — Z888 Allergy status to other drugs, medicaments and biological substances status: Secondary | ICD-10-CM

## 2016-07-06 DIAGNOSIS — F17210 Nicotine dependence, cigarettes, uncomplicated: Secondary | ICD-10-CM | POA: Diagnosis present

## 2016-07-06 DIAGNOSIS — F1910 Other psychoactive substance abuse, uncomplicated: Secondary | ICD-10-CM

## 2016-07-06 DIAGNOSIS — E785 Hyperlipidemia, unspecified: Secondary | ICD-10-CM | POA: Diagnosis present

## 2016-07-06 DIAGNOSIS — Z833 Family history of diabetes mellitus: Secondary | ICD-10-CM

## 2016-07-06 DIAGNOSIS — E1165 Type 2 diabetes mellitus with hyperglycemia: Secondary | ICD-10-CM | POA: Diagnosis present

## 2016-07-06 DIAGNOSIS — F329 Major depressive disorder, single episode, unspecified: Secondary | ICD-10-CM | POA: Diagnosis not present

## 2016-07-06 DIAGNOSIS — F251 Schizoaffective disorder, depressive type: Secondary | ICD-10-CM | POA: Diagnosis not present

## 2016-07-06 DIAGNOSIS — Z794 Long term (current) use of insulin: Secondary | ICD-10-CM

## 2016-07-06 DIAGNOSIS — E119 Type 2 diabetes mellitus without complications: Secondary | ICD-10-CM

## 2016-07-06 DIAGNOSIS — G2401 Drug induced subacute dyskinesia: Secondary | ICD-10-CM

## 2016-07-06 DIAGNOSIS — J45909 Unspecified asthma, uncomplicated: Secondary | ICD-10-CM

## 2016-07-06 LAB — GLUCOSE, CAPILLARY
Glucose-Capillary: 215 mg/dL — ABNORMAL HIGH (ref 65–99)
Glucose-Capillary: 180 mg/dL — ABNORMAL HIGH (ref 65–99)
Glucose-Capillary: 308 mg/dL — ABNORMAL HIGH (ref 65–99)
Glucose-Capillary: 385 mg/dL — ABNORMAL HIGH (ref 65–99)

## 2016-07-06 MED ORDER — TRAZODONE HCL 100 MG PO TABS: 100.0000 mg | ORAL_TABLET | Freq: Every evening | ORAL | Status: DC | PRN

## 2016-07-06 MED ORDER — ALUM & MAG HYDROXIDE-SIMETH 200-200-20 MG/5ML PO SUSP: 30.0000 mL | ORAL | Status: DC | PRN

## 2016-07-06 MED ORDER — HYDROXYZINE HCL 25 MG PO TABS: 25.0000 mg | ORAL_TABLET | Freq: Three times a day (TID) | ORAL | Status: DC | PRN

## 2016-07-06 MED ORDER — ACETAMINOPHEN 325 MG PO TABS
650.0000 mg | ORAL_TABLET | Freq: Four times a day (QID) | ORAL | Status: DC | PRN
  Administered 2016-07-06: 650 mg via ORAL
  Filled 2016-07-06: qty 2

## 2016-07-06 MED ORDER — MAGNESIUM HYDROXIDE 400 MG/5ML PO SUSP: 30.0000 mL | Freq: Every day | ORAL | Status: DC | PRN

## 2016-07-06 MED ORDER — BENZTROPINE MESYLATE 1 MG PO TABS
1.0000 mg | ORAL_TABLET | Freq: Two times a day (BID) | ORAL | Status: DC
  Administered 2016-07-06 – 2016-07-07 (×2): 1 mg via ORAL
  Filled 2016-07-06 (×2): qty 1

## 2016-07-06 MED ORDER — ARIPIPRAZOLE 10 MG PO TABS
10.0000 mg | ORAL_TABLET | Freq: Every day | ORAL | Status: DC
  Administered 2016-07-06: 10 mg via ORAL
  Filled 2016-07-06: qty 1

## 2016-07-06 MED ORDER — SIMVASTATIN 40 MG PO TABS
40.0000 mg | ORAL_TABLET | Freq: Every day | ORAL | Status: DC
  Administered 2016-07-06 – 2016-07-16 (×11): 40 mg via ORAL
  Filled 2016-07-06 (×11): qty 1

## 2016-07-06 MED ORDER — METFORMIN HCL 500 MG PO TABS
500.0000 mg | ORAL_TABLET | Freq: Two times a day (BID) | ORAL | Status: DC
  Administered 2016-07-06: 500 mg via ORAL
  Filled 2016-07-06: qty 1

## 2016-07-06 MED ORDER — LISINOPRIL 10 MG PO TABS
20.0000 mg | ORAL_TABLET | Freq: Every day | ORAL | Status: DC
  Administered 2016-07-07 – 2016-07-16 (×10): 20 mg via ORAL
  Filled 2016-07-06 (×10): qty 2

## 2016-07-06 MED ORDER — INSULIN ASPART 100 UNIT/ML ~~LOC~~ SOLN
0.0000 [IU] | Freq: Three times a day (TID) | SUBCUTANEOUS | Status: DC
  Administered 2016-07-06: 11 [IU] via SUBCUTANEOUS
  Administered 2016-07-07: 15 [IU] via SUBCUTANEOUS
  Administered 2016-07-07: 8 [IU] via SUBCUTANEOUS
  Administered 2016-07-07: 15 [IU] via SUBCUTANEOUS
  Administered 2016-07-08: 8 [IU] via SUBCUTANEOUS
  Administered 2016-07-08: 2 [IU] via SUBCUTANEOUS
  Administered 2016-07-08: 8 [IU] via SUBCUTANEOUS
  Administered 2016-07-09: 15 [IU] via SUBCUTANEOUS
  Administered 2016-07-09: 11 [IU] via SUBCUTANEOUS
  Administered 2016-07-09: 3 [IU] via SUBCUTANEOUS
  Administered 2016-07-10 (×2): 11 [IU] via SUBCUTANEOUS
  Administered 2016-07-10: 5 [IU] via SUBCUTANEOUS
  Administered 2016-07-11: 3 [IU] via SUBCUTANEOUS
  Administered 2016-07-11 (×2): 2 [IU] via SUBCUTANEOUS
  Administered 2016-07-12 (×2): 8 [IU] via SUBCUTANEOUS
  Administered 2016-07-13: 11 [IU] via SUBCUTANEOUS
  Administered 2016-07-13: 3 [IU] via SUBCUTANEOUS
  Administered 2016-07-14: 11 [IU] via SUBCUTANEOUS
  Administered 2016-07-14: 5 [IU] via SUBCUTANEOUS
  Administered 2016-07-15: 3 [IU] via SUBCUTANEOUS
  Administered 2016-07-15: 8 [IU] via SUBCUTANEOUS
  Administered 2016-07-15: 3 [IU] via SUBCUTANEOUS
  Administered 2016-07-16 (×2): 2 [IU] via SUBCUTANEOUS
  Administered 2016-07-16: 3 [IU] via SUBCUTANEOUS
  Filled 2016-07-06: qty 3
  Filled 2016-07-06: qty 11
  Filled 2016-07-06: qty 8
  Filled 2016-07-06 (×2): qty 3
  Filled 2016-07-06: qty 8
  Filled 2016-07-06: qty 11
  Filled 2016-07-06: qty 2
  Filled 2016-07-06: qty 1
  Filled 2016-07-06: qty 2
  Filled 2016-07-06: qty 5
  Filled 2016-07-06: qty 8
  Filled 2016-07-06: qty 11
  Filled 2016-07-06 (×2): qty 2
  Filled 2016-07-06: qty 11
  Filled 2016-07-06: qty 8
  Filled 2016-07-06: qty 5
  Filled 2016-07-06: qty 11
  Filled 2016-07-06: qty 15
  Filled 2016-07-06: qty 21
  Filled 2016-07-06: qty 2

## 2016-07-06 MED ORDER — METFORMIN HCL 500 MG PO TABS
500.0000 mg | ORAL_TABLET | Freq: Two times a day (BID) | ORAL | Status: DC
  Administered 2016-07-07 – 2016-07-10 (×7): 500 mg via ORAL
  Filled 2016-07-06 (×7): qty 1

## 2016-07-06 MED ORDER — MONTELUKAST SODIUM 10 MG PO TABS
10.0000 mg | ORAL_TABLET | Freq: Every day | ORAL | Status: DC
  Administered 2016-07-06 – 2016-07-16 (×11): 10 mg via ORAL
  Filled 2016-07-06 (×11): qty 1

## 2016-07-06 MED ORDER — VENLAFAXINE HCL ER 75 MG PO CP24
75.0000 mg | ORAL_CAPSULE | Freq: Every day | ORAL | Status: DC
  Administered 2016-07-07 – 2016-07-09 (×3): 75 mg via ORAL
  Filled 2016-07-06 (×3): qty 1

## 2016-07-06 MED ORDER — VENLAFAXINE HCL ER 75 MG PO CP24: 75.0000 mg | ORAL_CAPSULE | Freq: Every day | ORAL | Status: DC

## 2016-07-06 MED ORDER — ARIPIPRAZOLE 10 MG PO TABS
10.0000 mg | ORAL_TABLET | Freq: Every day | ORAL | Status: DC
  Administered 2016-07-07: 10 mg via ORAL
  Filled 2016-07-06 (×2): qty 1

## 2016-07-06 MED ORDER — HYDROCHLOROTHIAZIDE 25 MG PO TABS
25.0000 mg | ORAL_TABLET | Freq: Every day | ORAL | Status: DC
  Administered 2016-07-07 – 2016-07-16 (×10): 25 mg via ORAL
  Filled 2016-07-06 (×10): qty 1

## 2016-07-06 MED ORDER — INSULIN GLARGINE 100 UNIT/ML ~~LOC~~ SOLN
22.0000 [IU] | Freq: Every day | SUBCUTANEOUS | Status: DC
  Administered 2016-07-06: 22 [IU] via SUBCUTANEOUS
  Filled 2016-07-06 (×2): qty 0.22

## 2016-07-06 MED ORDER — LORAZEPAM 2 MG PO TABS
2.0000 mg | ORAL_TABLET | Freq: Four times a day (QID) | ORAL | Status: DC | PRN
  Administered 2016-07-06 – 2016-07-07 (×2): 2 mg via ORAL
  Filled 2016-07-06 (×2): qty 1

## 2016-07-06 MED ORDER — NICOTINE 21 MG/24HR TD PT24
21.0000 mg | MEDICATED_PATCH | Freq: Every day | TRANSDERMAL | Status: DC
  Administered 2016-07-07 – 2016-07-17 (×11): 21 mg via TRANSDERMAL
  Filled 2016-07-06 (×11): qty 1

## 2016-07-06 NOTE — Progress Notes (Signed)
Patient ID: Tony Cunningham, male   DOB: June 10, 1975, 41 y.o.   MRN: [id number] PER STATE REGULATIONS 482.30  THIS CHART WAS REVIEWED FOR MEDICAL NECESSITY WITH RESPECT TO THE PATIENT'S ADMISSION/DURATION OF STAY.  NEXT REVIEW DATE: 07/10/16  Loura Halt, RN, BSN CASE MANAGER

## 2016-07-06 NOTE — [facility] Group Notes (Signed)
[facility] Group Notes:  (Nursing/MHT/Case Management/Adjunct)  Date:  07/06/2016  Time:  11:39 PM  Type of Therapy:  Group Therapy  Participation Level:  Active  Participation Quality:  Attentive  Affect:  Flat  Cognitive:  Alert  Insight:  Appropriate  Engagement in Group:  Limited  Modes of Intervention:  Discussion  Summary of Progress/Problems: Pt came into group late. When pt arrived he stated that he did not anything to say because he had just arrived on the unit today.   Veva Holes 07/06/2016, 11:39 PM

## 2016-07-06 NOTE — ED Notes (Addendum)
Pt compliant  with medications. CBG was 308. Pt aware he will be transferred to BMU this afternoon. Pt accepting. Maintained on 15 minute checks and observation by security camera for safety.

## 2016-07-06 NOTE — ED Provider Notes (Signed)
-----------------------------------------   9:09 AM on 07/06/2016 -----------------------------------------   Blood pressure 121/81, pulse 97, temperature 98.3 F (36.8 C), temperature source Oral, resp. rate 18, height 6' (1.829 m), weight 180 lb (81.6 kg), SpO2 100 %.  The patient had no acute events since last update.  Calm and cooperative at this time. Meets inpatient criteria, weight listed to multiple facilities.    Rebecka Apley, MD 07/06/16 [phone]

## 2016-07-06 NOTE — Progress Notes (Addendum)
Patient with depressed affect, cooperative with admission interview and admission assessment. Skin check with no wounds or bruises. Skin check with no contraband. Patient slightly anxious, but manages well. Patient oriented to unit and schedule briefly. Slight delay when responding. Denies HI at this time. States AH present as mumbling telling him to hurt self, patient is able to ignore. States he "came off medications at group home:. Safety maintained. Patient showers, dresses and eats evening meal. Insulin administered as ordered. Safety maintained.

## 2016-07-06 NOTE — ED Notes (Signed)

## 2016-07-06 NOTE — Tx Team (Signed)
Initial Treatment Plan 07/06/2016 5:06 PM [name] KCL:[id number]    PATIENT STRESSORS: Health problems Medication change or noncompliance   PATIENT STRENGTHS: Capable of independent living Physical Health   PATIENT IDENTIFIED PROBLEMS: Anxiety    Auditory hallucinations/Suicidal ideas                 DISCHARGE CRITERIA:  Adequate post-discharge living arrangements Improved stabilization in mood, thinking, and/or behavior  PRELIMINARY DISCHARGE PLAN: Attend aftercare/continuing care group Return to previous living arrangement  PATIENT/FAMILY INVOLVEMENT: This treatment plan has been presented to and reviewed with the patient, Tony Cunningham, and/or family member, .  The patient and family have been given the opportunity to ask questions and make suggestions.  Ignacia Felling, RN 07/06/2016, 5:06 PM

## 2016-07-06 NOTE — ED Notes (Signed)
Pt will be transferred to BMU. Pt is IVC. Belongings will be sent with pt. Pt accepting.

## 2016-07-06 NOTE — ED Notes (Signed)
Patient resting quietly in room. No noted distress or abnormal behaviors noted. Will continue 15 minute checks and observation by security camera for safety. 

## 2016-07-06 NOTE — Consult Note (Signed)
[facility] Consult   Reason for Consult:  Consult for 41 year old man came to the hospital saying he was suicidal Referring Physician:  Quale Patient Identification: Tony Cunningham MRN:  [id number] Principal Diagnosis: MDD (major depressive disorder), recurrent, severe, with psychosis ([place]) Diagnosis:   Patient Active Problem List   Diagnosis Date Noted  . MDD (major depressive disorder), recurrent, severe, with psychosis ([place]) [F33.3] 02/07/2016  . Sinus complaint [R09.89] 12/25/2010  . Diabetes ([place]) [E11.9] 12/25/2010  . Depression [F32.9] 12/25/2010  . Anxiety [F41.9] 12/25/2010  . High blood pressure [I10] 12/25/2010    Total Time spent with patient: 1 hour  Subjective:   Tony Cunningham is a 41 y.o. male patient admitted with "I'm suicidal and hearing voices".  HPI:  Patient interviewed. Chart reviewed. 41 year old man came to the emergency room from his group home. They called an ambulance. He says that he is suicidal. He says he has thoughts of wanting to kill himself by cutting himself. He's been feeling bad for about 3 days. Mood feels down and sad. He says that he is hearing voices telling him to hurt himself. He also says that he is having memories of his mother. This is a little hard for me to put together as he tells me that his mother is still alive and that he still is in contact with her. He also tells me that the people at the group home have been unpleasant to him but not going to any detail about that. He presents as a aloof and withdrawn person and is difficult to get much history from him. Denies alcohol or drug abuse. Says that he does take his medication as prescribed. No homicidal ideation.  Social history: Living in a group home. Has been there about 4-6 months he estimates. Says that he does not like it.  Medical history: Has diabetes and is insulin-dependent. Also high blood pressure and possible COPD  Substance abuse history: He denies that he drinks  or uses drugs and denies there being any past history of alcohol or drug abuse.  Past Psychiatric History: Patient has a long history of mental illness and has had multiple hospitalizations including multiple hospitalizations in [place]. Most of those records are unavailable to me. 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 Not sure whether he ever returns to an adequate baseline. He tells me that he has tried to cut himself and kill himself in the past. Denies any history of violence. He can't remember the names of any of his medicines. Outpatient medicines listed on admission included Abilify and Effexor.  Risk to Self: Suicidal Ideation: Yes-Currently Present Suicidal Intent: Yes-Currently Present Is patient at risk for suicide?: Yes Suicidal Plan?: Yes-Currently Present Specify Current Suicidal Plan: Cutting self Access to Means: No What has been your use of drugs/alcohol within the last 12 months?: Reports of none How many times?: 2 Other Self Harm Risks: Reports of none Triggers for Past Attempts: Family contact, Hallucinations Intentional Self Injurious Behavior: None Risk to Others: Homicidal Ideation: No Thoughts of Harm to Others: No Current Homicidal Intent: No Current Homicidal Plan: No Access to Homicidal Means: No Identified Victim: Reports of none History of harm to others?: No Assessment of Violence: None Noted Violent Behavior Description: Reports of none Does patient have access to weapons?: No Criminal Charges Pending?: No Does patient have a court date: No Prior Inpatient Therapy: Prior Inpatient Therapy: Yes Prior Therapy Dates: Unable to remember dates  Prior Therapy Facilty/Provider(s): [place], [place] Reason for Treatment: SI & A/H Prior Outpatient Therapy: Prior Outpatient Therapy: Yes Prior Therapy Dates: Current Prior Therapy Facilty/Provider(s): Unable to remember dates Reason  for Treatment: SI & A/V Does patient have an ACCT team?: No Does patient have Intensive In-House Services?  : No Does patient have [facility] services? : No Does patient have P4CC services?: No  Past Medical History:  Past Medical History:  Diagnosis Date  . Anxiety   . Asthma   . Diabetes mellitus   . High blood pressure   . Sinus complaint    No past surgical history on file. Family History: No family history on file. Family Psychiatric  History: Denies knowing of any family history Social History:  History  Alcohol Use No     History  Drug Use No    Social History   Social History  . Marital status: Single    Spouse name: [name]  . Number of children: [name]  . Years of education: [name]   Social History Main Topics  . Smoking status: Current Every Day Smoker    Packs/day: 0.50    Types: Cigarettes  . Smokeless tobacco: Never Used  . Alcohol use No  . Drug use: No  . Sexual activity: Not Currently   Other Topics Concern  . Not on file   Social History Narrative  . No narrative on file   Additional Social History:    Allergies:   Allergies  Allergen Reactions  . Seroquel [Quetiapine Fumerate]     Labs:  Results for orders placed or performed during the hospital encounter of 07/05/16 (from the past 48 hour(s))  Comprehensive metabolic panel     Status: Abnormal   Collection Time: 07/05/16 11:47 AM  Result Value Ref Range   Sodium 135 135 - 145 mmol/L   Potassium 3.9 3.5 - 5.1 mmol/L   Chloride 97 (L) 101 - 111 mmol/L   CO2 28 22 - 32 mmol/L   Glucose, Bld 201 (H) 65 - 99 mg/dL   BUN 18 6 - 20 mg/dL   Creatinine, Ser 0.85 0.61 - 1.24 mg/dL   Calcium 9.3 8.9 - 10.3 mg/dL   Total Protein 6.6 6.5 - 8.1 g/dL   Albumin 3.9 3.5 - 5.0 g/dL   AST 16 15 - 41 U/L   ALT 24 17 - 63 U/L   Alkaline Phosphatase 57 38 - 126 U/L   Total Bilirubin 1.2 0.3 - 1.2 mg/dL   GFR calc non Af Amer >60 >60 mL/min   GFR calc Af Amer >60 >60 mL/min    Comment: (NOTE) The eGFR has  been calculated using the CKD EPI equation. This calculation has not been validated in all clinical situations. eGFR's persistently <60 mL/min signify possible Chronic Kidney Disease.    Anion gap 10 5 - 15  Ethanol     Status: None   Collection Time: 07/05/16 11:47 AM  Result Value Ref Range   Alcohol, Ethyl (B) <5 <5 mg/dL    Comment:        LOWEST DETECTABLE LIMIT FOR SERUM ALCOHOL IS 5 mg/dL FOR MEDICAL PURPOSES ONLY   Salicylate level     Status: None   Collection Time: 07/05/16 11:47 AM  Result Value Ref Range   Salicylate Lvl <1.1 2.8 - 30.0 mg/dL  Acetaminophen level     Status: Abnormal   Collection Time: 07/05/16 11:47 AM  Result Value Ref Range   Acetaminophen (  Tylenol), Serum <10 (L) 10 - 30 ug/mL    Comment:        THERAPEUTIC CONCENTRATIONS VARY SIGNIFICANTLY. A RANGE OF 10-30 ug/mL MAY BE AN EFFECTIVE CONCENTRATION FOR MANY PATIENTS. HOWEVER, SOME ARE BEST TREATED AT CONCENTRATIONS OUTSIDE THIS RANGE. ACETAMINOPHEN CONCENTRATIONS >150 ug/mL AT 4 HOURS AFTER INGESTION AND >50 ug/mL AT 12 HOURS AFTER INGESTION ARE OFTEN ASSOCIATED WITH TOXIC REACTIONS.   cbc     Status: Abnormal   Collection Time: 07/05/16 11:47 AM  Result Value Ref Range   WBC 7.3 3.8 - 10.6 K/uL   RBC 5.27 4.40 - 5.90 MIL/uL   Hemoglobin 16.8 13.0 - 18.0 g/dL    Comment: RESULT REPEATED AND VERIFIED   HCT 46.3 40.0 - 52.0 %    Comment: RESULT REPEATED AND VERIFIED   MCV 88.0 80.0 - 100.0 fL   MCH 31.9 26.0 - 34.0 pg   MCHC 36.3 (H) 32.0 - 36.0 g/dL   RDW 13.0 11.5 - 14.5 %   Platelets 205 150 - 440 K/uL  Urine Drug Screen, Qualitative     Status: None   Collection Time: 07/05/16 11:47 AM  Result Value Ref Range   Tricyclic, Ur Screen NONE DETECTED NONE DETECTED   Amphetamines, Ur Screen NONE DETECTED NONE DETECTED   MDMA (Ecstasy)Ur Screen NONE DETECTED NONE DETECTED   Cocaine Metabolite,Ur [place] NONE DETECTED NONE DETECTED   Opiate, Ur Screen NONE DETECTED NONE DETECTED    Phencyclidine (PCP) Ur S NONE DETECTED NONE DETECTED   Cannabinoid 50 Ng, Ur [place] NONE DETECTED NONE DETECTED   Barbiturates, Ur Screen NONE DETECTED NONE DETECTED   Benzodiazepine, Ur Scrn NONE DETECTED NONE DETECTED   Methadone Scn, Ur NONE DETECTED NONE DETECTED    Comment: (NOTE) 725  Tricyclics, urine               Cutoff 1000 ng/mL 200  Amphetamines, urine             Cutoff 1000 ng/mL 300  MDMA (Ecstasy), urine           Cutoff 500 ng/mL 400  Cocaine Metabolite, urine       Cutoff 300 ng/mL 500  Opiate, urine                   Cutoff 300 ng/mL 600  Phencyclidine (PCP), urine      Cutoff 25 ng/mL 700  Cannabinoid, urine              Cutoff 50 ng/mL 800  Barbiturates, urine             Cutoff 200 ng/mL 900  Benzodiazepine, urine           Cutoff 200 ng/mL 1000 Methadone, urine                Cutoff 300 ng/mL 1100 1200 The urine drug screen provides only a preliminary, unconfirmed 1300 analytical test result and should not be used for non-medical 1400 purposes. Clinical consideration and professional judgment should 1500 be applied to any positive drug screen result due to possible 1600 interfering substances. A more specific alternate chemical method 1700 must be used in order to obtain a confirmed analytical result.  1800 Gas chromato graphy / mass spectrometry (GC/MS) is the preferred 1900 confirmatory method.   Glucose, capillary     Status: Abnormal   Collection Time: 07/05/16  4:53 PM  Result Value Ref Range   Glucose-Capillary 323 (H) 65 - 99 mg/dL  Glucose,  capillary     Status: Abnormal   Collection Time: 07/05/16  8:35 PM  Result Value Ref Range   Glucose-Capillary 392 (H) 65 - 99 mg/dL  Glucose, capillary     Status: Abnormal   Collection Time: 07/06/16  6:43 AM  Result Value Ref Range   Glucose-Capillary 215 (H) 65 - 99 mg/dL  Glucose, capillary     Status: Abnormal   Collection Time: 07/06/16 11:33 AM  Result Value Ref Range   Glucose-Capillary 180 (H) 65 - 99  mg/dL    Current Facility-Administered Medications  Medication Dose Route Frequency Provider Last Rate Last Dose  . ARIPiprazole (ABILIFY) tablet 10 mg  10 mg Oral Daily Gonzella Lex, MD      . benztropine (COGENTIN) tablet 1 mg  1 mg Oral BID Harvest Dark, MD   1 mg at 07/06/16 0815  . lisinopril (PRINIVIL,ZESTRIL) tablet 20 mg  20 mg Oral Daily Harvest Dark, MD   20 mg at 07/06/16 0815   And  . hydrochlorothiazide (HYDRODIURIL) tablet 25 mg  25 mg Oral Daily Harvest Dark, MD   25 mg at 07/06/16 1027  . hydrOXYzine (ATARAX/VISTARIL) tablet 25 mg  25 mg Oral TID PRN Harvest Dark, MD      . insulin aspart (novoLOG) injection 0-15 Units  0-15 Units Subcutaneous TID WC Harvest Dark, MD   3 Units at 07/06/16 1240  . insulin glargine (LANTUS) injection 22 Units  22 Units Subcutaneous QHS Harvest Dark, MD   22 Units at 07/05/16 2110  . LORazepam (ATIVAN) tablet 2 mg  2 mg Oral Q6H PRN Harvest Dark, MD   2 mg at 07/06/16 1027  . metFORMIN (GLUCOPHAGE) tablet 500 mg  500 mg Oral BID WC Gonzella Lex, MD      . montelukast (SINGULAIR) tablet 10 mg  10 mg Oral QHS Harvest Dark, MD   10 mg at 07/05/16 2111  . nicotine (NICODERM CQ - dosed in mg/24 hours) patch 21 mg  21 mg Transdermal Daily Harvest Dark, MD   21 mg at 07/06/16 0816  . simvastatin (ZOCOR) tablet 40 mg  40 mg Oral QHS Harvest Dark, MD   40 mg at 07/05/16 2111  . traZODone (DESYREL) tablet 100 mg  100 mg Oral QHS PRN Harvest Dark, MD   100 mg at 07/05/16 2112  . [START ON 07/07/2016] venlafaxine XR (EFFEXOR-XR) 24 hr capsule 75 mg  75 mg Oral Q breakfast Gonzella Lex, MD       Current Outpatient Prescriptions  Medication Sig Dispense Refill  . ARIPiprazole (ABILIFY) 10 MG tablet Take 1 tablet (10 mg total) by mouth daily. For mood control 30 tablet 0  . benztropine (COGENTIN) 1 MG tablet Take 1 tablet (1 mg total) by mouth 2 (two) times daily. For prevention of drug induced tremors  60 tablet 0  . canagliflozin (INVOKANA) 300 MG TABS tablet Take 1 tablet (300 mg total) by mouth daily before breakfast. For diabetes management 30 tablet 0  . hydrOXYzine (ATARAX/VISTARIL) 25 MG tablet Take 1 tablet (25 mg total) by mouth 3 (three) times daily as needed for anxiety. 60 tablet 0  . insulin glargine (LANTUS) 100 UNIT/ML injection Inject 0.22 mLs (22 Units total) into the skin at bedtime. For diabetes management 10 mL 0  . lisinopril-hydrochlorothiazide (PRINZIDE,ZESTORETIC) 20-25 MG tablet Take 1 tablet by mouth daily. For high blood pressure 30 tablet 0  . montelukast (SINGULAIR) 10 MG tablet Take 1 tablet (10 mg total) by  mouth at bedtime. For Asthma 30 tablet 0  . simvastatin (ZOCOR) 40 MG tablet Take 1 tablet (40 mg total) by mouth at bedtime. For high cholesterol 15 tablet 0  . traZODone (DESYREL) 100 MG tablet Take 1 tablet (100 mg total) by mouth at bedtime as needed for sleep. (Patient taking differently: Take 150 mg by mouth at bedtime as needed for sleep. ) 30 tablet 0  . diclofenac sodium (VOLTAREN) 1 % GEL Apply 2 g topically 2 (two) times daily. For arthritic pain    . insulin aspart (NOVOLOG) 100 UNIT/ML injection Inject 15 Units into the skin 3 (three) times daily with meals. For diabetes management 10 mL 0  . metFORMIN (GLUCOPHAGE) 500 MG tablet Take 1 tablet (500 mg total) by mouth 2 (two) times daily with a meal. For diabetes management 60 tablet 0  . nicotine (NICODERM CQ - DOSED IN MG/24 HOURS) 21 mg/24hr patch Place 1 patch (21 mg total) onto the skin daily. For smoking cessation 28 patch 0  . pregabalin (LYRICA) 75 MG capsule Take 1 capsule (75 mg total) by mouth 2 (two) times daily. For pain 16 capsule 0  . venlafaxine XR (EFFEXOR-XR) 37.5 MG 24 hr capsule Take 5 capsules (187.5 mg total) by mouth daily with breakfast. For depression (Patient taking differently: Take 150 mg by mouth daily with breakfast. For depression) 150 capsule 0     Musculoskeletal: Strength & Muscle Tone: within normal limits Gait & Station: normal Patient leans: [name]  Psychiatric Specialty Exam: Physical Exam  Nursing note and vitals reviewed. Constitutional: He appears well-developed and well-nourished.  HENT:  Head: Normocephalic and atraumatic.  Eyes: Conjunctivae are normal. Pupils are equal, round, and reactive to light.  Neck: Normal range of motion.  Cardiovascular: Regular rhythm and normal heart sounds.   Respiratory: Effort normal. No respiratory distress.  GI: Soft.  Musculoskeletal: Normal range of motion.  Neurological: He is alert.  Skin: Skin is warm and dry.  Psychiatric: His affect is blunt. His speech is delayed. He is slowed, withdrawn and actively hallucinating. Thought content is paranoid. Cognition and memory are impaired. He expresses impulsivity. He exhibits a depressed mood. He expresses suicidal ideation.    Review of Systems  Constitutional: Negative.   HENT: Negative.   Eyes: Negative.   Respiratory: Negative.   Cardiovascular: Negative.   Gastrointestinal: Negative.   Musculoskeletal: Negative.   Skin: Negative.   Neurological: Negative.   Psychiatric/Behavioral: Positive for depression, hallucinations, memory loss and suicidal ideas. Negative for substance abuse. The patient is nervous/anxious and has insomnia.     Blood pressure 121/81, pulse 97, temperature 98.3 F (36.8 C), temperature source Oral, resp. rate 18, height 6' (1.829 m), weight 81.6 kg (180 lb), SpO2 100 %.Body mass index is 24.41 kg/m.  General Appearance: Fairly Groomed  Eye Contact:  Minimal  Speech:  Slow  Volume:  Decreased  Mood:  Depressed and Dysphoric  Affect:  Constricted  Thought Process:  Goal Directed  Orientation:  Full (Time, Place, and Person)  Thought Content:  Rumination  Suicidal Thoughts:  Yes.  with intent/plan  Homicidal Thoughts:  No  Memory:  Immediate;   Good Recent;   Fair Remote;   Fair  Judgement:   Impaired  Insight:  Shallow  Psychomotor Activity:  Decreased  Concentration:  Concentration: Fair  Recall:  [employer] of Knowledge:  Fair  Language:  Fair  Akathisia:  No  Handed:  Right  AIMS (if indicated):  Assets:  Communication Skills Desire for Improvement Financial Resources/Insurance Housing Physical Health Resilience Social Support  ADL's:  Intact  Cognition:  Impaired,  Mild  Sleep:        Treatment Plan Summary: Daily contact with patient to assess and evaluate symptoms and progress in treatment, Medication management and Plan Patient is being admitted to the psychiatry unit. I am changing the venlafaxine to 75 mg a day and changing the Zyprexa he has been given here back to the Abilify. Continue his insulin and restart metformin. Continue medicine for blood pressure. Full set of labs will be obtained. Engage patient in groups and activities on the unit. 15 minute checks in place. Reviewed case with TTS.  Disposition: Recommend psychiatric Inpatient admission when medically cleared. Supportive therapy provided about ongoing stressors.  Alethia Berthold, MD 07/06/2016 3:19 PM

## 2016-07-06 NOTE — BH Assessment (Signed)
Pt has been placed on the wait list for [facility]

## 2016-07-06 NOTE — BH Assessment (Signed)
Patient has been accepted to [facility].  Patient assigned to room 304 Admitting physician is Dr. Ardyth Harps Attending physician is Dr. Ardyth Harps Call report to [phone] Representative was Zola Button and [facility] Staff are aware of admission

## 2016-07-07 ENCOUNTER — Encounter: Payer: Self-pay | Admitting: Psychiatry

## 2016-07-07 DIAGNOSIS — F251 Schizoaffective disorder, depressive type: Secondary | ICD-10-CM

## 2016-07-07 DIAGNOSIS — E785 Hyperlipidemia, unspecified: Secondary | ICD-10-CM

## 2016-07-07 DIAGNOSIS — F17200 Nicotine dependence, unspecified, uncomplicated: Secondary | ICD-10-CM

## 2016-07-07 DIAGNOSIS — J45909 Unspecified asthma, uncomplicated: Secondary | ICD-10-CM

## 2016-07-07 LAB — TSH: TSH: 2.739 u[IU]/mL (ref 0.350–4.500)

## 2016-07-07 LAB — BASIC METABOLIC PANEL
Anion gap: 10 (ref 5–15)
BUN: 16 mg/dL (ref 6–20)
CALCIUM: 9.2 mg/dL (ref 8.9–10.3)
CHLORIDE: 91 mmol/L — AB (ref 101–111)
CO2: 30 mmol/L (ref 22–32)
CREATININE: 1 mg/dL (ref 0.61–1.24)
Glucose, Bld: 326 mg/dL — ABNORMAL HIGH (ref 65–99)
Potassium: 3.7 mmol/L (ref 3.5–5.1)
Sodium: 131 mmol/L — ABNORMAL LOW (ref 135–145)

## 2016-07-07 LAB — CBC
HEMATOCRIT: 49.8 % (ref 40.0–52.0)
HEMOGLOBIN: 17.1 g/dL (ref 13.0–18.0)
MCH: 30.9 pg (ref 26.0–34.0)
MCHC: 34.4 g/dL (ref 32.0–36.0)
MCV: 89.9 fL (ref 80.0–100.0)
Platelets: 226 10*3/uL (ref 150–440)
RBC: 5.54 MIL/uL (ref 4.40–5.90)
RDW: 13.2 % (ref 11.5–14.5)
WBC: 7 10*3/uL (ref 3.8–10.6)

## 2016-07-07 LAB — GLUCOSE, CAPILLARY
GLUCOSE-CAPILLARY: 295 mg/dL — AB (ref 65–99)
GLUCOSE-CAPILLARY: 475 mg/dL — AB (ref 65–99)
GLUCOSE-CAPILLARY: 446 mg/dL — AB (ref 65–99)
GLUCOSE-CAPILLARY: 395 mg/dL — AB (ref 65–99)
GLUCOSE-CAPILLARY: 336 mg/dL — AB (ref 65–99)
GLUCOSE-CAPILLARY: 240 mg/dL — AB (ref 65–99)
Glucose-Capillary: 422 mg/dL — ABNORMAL HIGH (ref 65–99)
Glucose-Capillary: 442 mg/dL — ABNORMAL HIGH (ref 65–99)

## 2016-07-07 LAB — LIPID PANEL
CHOL/HDL RATIO: 3.6 ratio
CHOLESTEROL: 120 mg/dL (ref 0–200)
HDL: 33 mg/dL — ABNORMAL LOW (ref >40)
LDL Cholesterol: 56 mg/dL (ref 0–99)
Triglycerides: 153 mg/dL — ABNORMAL HIGH (ref <150)
VLDL: 31 mg/dL (ref 0–40)

## 2016-07-07 MED ORDER — CLONAZEPAM 0.5 MG PO TABS
0.5000 mg | ORAL_TABLET | Freq: Every day | ORAL | Status: DC
  Administered 2016-07-07 – 2016-07-08 (×2): 0.5 mg via ORAL
  Filled 2016-07-07 (×2): qty 1

## 2016-07-07 MED ORDER — INSULIN ASPART 100 UNIT/ML ~~LOC~~ SOLN: 0.0000 [IU] | Freq: Three times a day (TID) | SUBCUTANEOUS | Status: DC

## 2016-07-07 MED ORDER — INSULIN ASPART 100 UNIT/ML ~~LOC~~ SOLN
6.0000 [IU] | Freq: Three times a day (TID) | SUBCUTANEOUS | Status: DC
  Administered 2016-07-07: 6 [IU] via SUBCUTANEOUS

## 2016-07-07 MED ORDER — OLANZAPINE 5 MG PO TABS
7.5000 mg | ORAL_TABLET | Freq: Every day | ORAL | Status: DC
  Administered 2016-07-07: 7.5 mg via ORAL
  Filled 2016-07-07: qty 2

## 2016-07-07 MED ORDER — AMANTADINE HCL 100 MG PO CAPS
100.0000 mg | ORAL_CAPSULE | Freq: Two times a day (BID) | ORAL | Status: DC
  Administered 2016-07-07 – 2016-07-17 (×20): 100 mg via ORAL
  Filled 2016-07-07 (×20): qty 1

## 2016-07-07 MED ORDER — INSULIN ASPART 100 UNIT/ML ~~LOC~~ SOLN
0.0000 [IU] | Freq: Every day | SUBCUTANEOUS | Status: DC
  Administered 2016-07-07: 5 [IU] via SUBCUTANEOUS
  Administered 2016-07-10 – 2016-07-13 (×2): 2 [IU] via SUBCUTANEOUS
  Filled 2016-07-07: qty 5

## 2016-07-07 MED ORDER — INSULIN ASPART 100 UNIT/ML ~~LOC~~ SOLN
10.0000 [IU] | Freq: Once | SUBCUTANEOUS | Status: AC
  Administered 2016-07-07: 10 [IU] via SUBCUTANEOUS
  Filled 2016-07-07: qty 10

## 2016-07-07 MED ORDER — INSULIN ASPART 100 UNIT/ML ~~LOC~~ SOLN
15.0000 [IU] | Freq: Three times a day (TID) | SUBCUTANEOUS | Status: DC
  Administered 2016-07-07 – 2016-07-13 (×17): 15 [IU] via SUBCUTANEOUS
  Filled 2016-07-07 (×7): qty 15
  Filled 2016-07-07: qty 1
  Filled 2016-07-07 (×8): qty 15

## 2016-07-07 MED ORDER — INSULIN GLARGINE 100 UNIT/ML ~~LOC~~ SOLN
30.0000 [IU] | Freq: Every day | SUBCUTANEOUS | Status: DC
  Administered 2016-07-07: 30 [IU] via SUBCUTANEOUS
  Filled 2016-07-07 (×2): qty 0.3

## 2016-07-07 NOTE — BHH Suicide Risk Assessment (Addendum)
[facility] Admission Suicide Risk Assessment   Nursing information obtained from:    Demographic factors:    Current Mental Status:    Loss Factors:    Historical Factors:    Risk Reduction Factors:     Total Time spent with patient: 1 hour Principal Problem: Schizoaffective disorder (HCC) Diagnosis:   Patient Active Problem List   Diagnosis Date Noted  . Tobacco use disorder [F17.200] 07/07/2016  . Dyslipidemia [E78.5] 07/07/2016  . Asthma [J45.909] 07/07/2016  . Schizoaffective disorder (HCC) [F25.9] 07/07/2016  . HTN (hypertension) [I10] 07/06/2016  . Diabetes (HCC) [E11.9] 12/25/2010   Subjective Data:   Continued Clinical Symptoms:  Alcohol Use Disorder Identification Test Final Score (AUDIT): 0 The "Alcohol Use Disorders Identification Test", Guidelines for Use in Primary Care, Second Edition.  World Science writer [facility]). Score between 0-7:  no or low risk or alcohol related problems. Score between 8-15:  moderate risk of alcohol related problems. Score between 16-19:  high risk of alcohol related problems. Score 20 or above:  warrants further diagnostic evaluation for alcohol dependence and treatment.   CLINICAL FACTORS:   Depression:   Hopelessness Insomnia Severe Currently Psychotic Previous Psychiatric Diagnoses and Treatments Medical Diagnoses and Treatments/Surgeries   M  Psychiatric Specialty Exam: Physical Exam  ROS  Blood pressure 104/76, pulse 89, temperature 98.6 F (37 C), temperature source Oral, resp. rate 18, height 6' (1.829 m), weight 74.8 kg (165 lb), SpO2 100 %.Body mass index is 22.38 kg/m.                                                    Sleep:  Number of Hours: 6.45      COGNITIVE FEATURES THAT CONTRIBUTE TO RISK:  Loss of executive function    SUICIDE RISK:   Moderate:  Frequent suicidal ideation with limited intensity, and duration, some specificity in terms of plans, no associated intent, good  self-control, limited dysphoria/symptomatology, some risk factors present, and identifiable protective factors, including available and accessible social support.  PLAN OF CARE: admit to [facility]  I certify that inpatient services furnished can reasonably be expected to improve the patient's condition.   [name], MD 07/07/2016, 4:05 PM

## 2016-07-07 NOTE — Progress Notes (Signed)
41 year old man admitted from group home after reporting feeling suicidal and stating he wanted to kill himself by cutting himself. Today he has been compliant with medications.Denies denies SI/HI and contracts for safety, denies depression, reports anxiety 7/10. Requested and received ativan 2 mg at 0800 with good effect. He is disheveled and his gait is unsteady with shuffling and swaying. Bed was moved to be closer to the nursing station and encouraged to use a wheelchair for safety.   At 1127 capillary glucose was 422, MD notified. 15 units sliding scale and 6 units standing novolog were administered.  1305 recheck was 475. MD notified again and diabetes management consulted. They reviewed home medications and made recommendations.  1430 recheck was 442. MD notified 10 units one time was ordered. Will recheck again at 1630 for efficacy of coverage.   Will continue to monitor for change in status and encourage a safe environment.

## 2016-07-07 NOTE — Progress Notes (Signed)
Hospitalist, Dr. Amado Coe called via telephone. Updated Dr. Amado Coe regarding BMP and CBC results. Per Dr. Amado Coe, give tonight's dose of lantus (see MAR) at 2000 instead of 2200. Will update order.

## 2016-07-07 NOTE — Progress Notes (Addendum)
Received page from RN caring for pt.  CBG at 11:30am was 422 mg/dl followed by CBG of 654 mg/dl at 1pm.  Reviewed pt's home meds.  Per records, pt supposedly takes Novolog 15 units TID with meals at home.  Eating well per RN caring for pt.  Recommend increasing Novolog to 15 units TID with meals (hold if pt eats <50% of meal).  RN to call MD with recs and secure orders for pt.  Also recommend checking CBG by 2 to 3pm and make sure CBG is coming down.  If not, could give another dose of Novolog for correction but will need MD order for one time dose of Novolog.  RN aware.   --Will follow patient during hospitalization--  Ambrose Finland RN, MSN, CDE Diabetes Coordinator Inpatient Glycemic Control Team Team Pager: [phone] (8a-5p)

## 2016-07-07 NOTE — Progress Notes (Signed)
Patient was seen by hospitalist. Last blood sugar after meal time insulin and sliding scale was 395. Will retake blood glucose at 1830 and administer bedtime lantus at 1900 per MD request. Dr. Ardyth Harps updated on patient status per request.

## 2016-07-07 NOTE — Progress Notes (Signed)
Recreation Therapy Notes  INPATIENT RECREATION THERAPY ASSESSMENT  Patient Details Name: Tony Cunningham MRN: [id number] DOB: [birth year] Today's Date: 07/07/2016  Patient Stressors: Family, Friends Youth worker mom who he hasn't seen in a while; lack of supportive friends)  Coping Skills:   Isolate, Arguments, Avoidance, Exercise, Art/Dance, Talking, Sports, Music  Personal Challenges: Anger, Communication, Concentration, Decision-Making, Expressing Yourself, Relationships, Self-Esteem/Confidence, Social Interaction, Stress Management, Time Management, Trusting Others  Leisure Interests (2+):  Sports - [employer], Games - Clinical cytogeneticist games, Individual - Other (Comment) (Art)  Awareness of Community Resources:     [employer]:     Current Use:    If no, Barriers?:    Patient Strengths:  Paramedic, creative  Patient Identified Areas of Improvement:  Drawing  Current Recreation Participation:  Nothing  Patient Goal for Hospitalization:  None  [place] of Residence:  [place] of Residence:  [place]   Current SI (including self-harm):   ("I don't know")  Current HI:   ("Sometimes I do feel that way")  Consent to Intern Participation: [name]   Jacquelynn Cree, LRT/CTRS 07/07/2016, 4:07 PM

## 2016-07-07 NOTE — Plan of Care (Signed)
Problem: Safety: Goal: Ability to remain free from injury will improve Outcome: Progressing Reminded to wear non-slip socks, Q15 minute checks maintained, assess for increased sedation from medications.

## 2016-07-07 NOTE — [facility] Group Notes (Signed)
[facility] LCSW Group Therapy Note  Date/Time: 07/07/16 1500  Type of Therapy/Topic:  Group Therapy:  Feelings about Diagnosis  Participation Level:  Active   Mood: pleasant   Description of Group:    This group will allow patients to explore their thoughts and feelings about diagnoses they have received. Patients will be guided to explore their level of understanding and acceptance of these diagnoses. Facilitator will encourage patients to process their thoughts and feelings about the reactions of others to their diagnosis, and will guide patients in identifying ways to discuss their diagnosis with significant others in their lives. This group will be process-oriented, with patients participating in exploration of their own experiences as well as giving and receiving support and challenge from other group members.   Therapeutic Goals: 1. Patient will demonstrate understanding of diagnosis as evidence by identifying two or more symptoms of the disorder:  2. Patient will be able to express two feelings regarding the diagnosis 3. Patient will demonstrate ability to communicate their needs through discussion and/or role plays  Summary of Patient Progress: Pt was an active participant in the group and made a number of contributions to the discussion.  Pt was able to identify thoughts and feelings related to his diagnosis.     Therapeutic Modalities:   Cognitive Behavioral Therapy Brief Therapy Feelings Identification   Daleen Squibb, LCSW

## 2016-07-07 NOTE — Consult Note (Signed)
Reason for Consult: No chief complaint on file.  Referring Physician: [name], MD  Chief complaint - Accu-Chek greater than 450 HPI: Tony Cunningham is an 42 y.o. male with past medical history of hyperlipidemia asthma and uncontrolled diabetes mellitus is admitted to the [facility] for major depressive disorder with psychosis. Hospitalist team is consulted as the patient's Accu-Chek was greater than 450. Patient was watching TV in [facility] during my examination. Denies any complaints. He reported that he always feels thirsty which is not a new complaint. Having trouble falling asleep otherwise no complaints    Past Medical History:  Diagnosis Date  . Anxiety   . Asthma   . Diabetes mellitus   . High blood pressure   . Sinus complaint     History reviewed. No pertinent surgical history.  Family history diabetes mellitus and hypertension runs in his family Social History:  reports that he has been smoking Cigarettes.  He has been smoking about 0.50 packs per day. He has never used smokeless tobacco. He reports that he does not drink alcohol or use drugs.  Allergies:  Allergies  Allergen Reactions  . Seroquel [Quetiapine Fumerate]     Medications: I have reviewed the patient's current medications.  Results for orders placed or performed during the hospital encounter of 07/06/16 (from the past 48 hour(s))  Glucose, capillary     Status: Abnormal   Collection Time: 07/06/16  9:11 PM  Result Value Ref Range   Glucose-Capillary 385 (H) 65 - 99 mg/dL  Glucose, capillary     Status: Abnormal   Collection Time: 07/07/16  6:37 AM  Result Value Ref Range   Glucose-Capillary 295 (H) 65 - 99 mg/dL  Lipid panel     Status: Abnormal   Collection Time: 07/07/16  6:45 AM  Result Value Ref Range   Cholesterol 120 0 - 200 mg/dL   Triglycerides 401 (H) <150 mg/dL   HDL 33 (L) >02 mg/dL   Total CHOL/HDL Ratio 3.6 RATIO   VLDL 31 0 - 40 mg/dL   LDL Cholesterol 56 0 - 99 mg/dL    Comment:         Total Cholesterol/HDL:CHD Risk Coronary Heart Disease Risk Table                     Men   Women  1/2 Average Risk   3.4   3.3  Average Risk       5.0   4.4  2 X Average Risk   9.6   7.1  3 X Average Risk  23.4   11.0        Use the calculated Patient Ratio above and the CHD Risk Table to determine the patient's CHD Risk.        ATP III CLASSIFICATION (LDL):  <100     mg/dL   Optimal  725-366  mg/dL   Near or Above                    Optimal  130-159  mg/dL   Borderline  440-347  mg/dL   High  >425     mg/dL   Very High   TSH     Status: None   Collection Time: 07/07/16  6:45 AM  Result Value Ref Range   TSH 2.739 0.350 - 4.500 uIU/mL    Comment: Performed by a 3rd Generation assay with a functional sensitivity of <=0.01 uIU/mL.  Glucose, capillary     Status:  Abnormal   Collection Time: 07/07/16 11:27 AM  Result Value Ref Range   Glucose-Capillary 422 (H) 65 - 99 mg/dL   Comment 1 Call MD NNP PA CNM   Glucose, capillary     Status: Abnormal   Collection Time: 07/07/16  1:04 PM  Result Value Ref Range   Glucose-Capillary 475 (H) 65 - 99 mg/dL  Glucose, capillary     Status: Abnormal   Collection Time: 07/07/16  2:24 PM  Result Value Ref Range   Glucose-Capillary 446 (H) 65 - 99 mg/dL  Glucose, capillary     Status: Abnormal   Collection Time: 07/07/16  4:26 PM  Result Value Ref Range   Glucose-Capillary 442 (H) 65 - 99 mg/dL    No results found.  ROS:  CONSTITUTIONAL: Denies fevers, chills. Denies any fatigue, weakness.  EYES: Denies blurry vision, double vision, eye pain. EARS, NOSE, THROAT: Denies tinnitus, ear pain, hearing loss. RESPIRATORY: Denies cough, wheeze, shortness of breath.  CARDIOVASCULAR: Denies chest pain, palpitations, edema.  GASTROINTESTINAL: Denies nausea, vomiting, diarrhea, abdominal pain. Denies bright red blood per rectum. GENITOURINARY: Denies dysuria, hematuria. ENDOCRINE: Denies nocturia or thyroid problems. HEMATOLOGIC AND  LYMPHATIC: Denies easy bruising or bleeding. SKIN: Denies rash or lesion. MUSCULOSKELETAL: Denies pain in neck, back, shoulder, knees, hips or arthritic symptoms.  NEUROLOGIC: Denies paralysis, paresthesias.  PSYCHIATRIC: Denies anxiety or depressive symptoms. Blood pressure 104/76, pulse 89, temperature 98.6 F (37 C), temperature source Oral, resp. rate 18, height 6' (1.829 m), weight 74.8 kg (165 lb), SpO2 100 %.   PHYSICAL EXAMINATION:  GENERAL: Well-nourished, well-developed , currently in no acute distress.  HEAD: Normocephalic, atraumatic.  EYES: Pupils equal, round, and reactive to light. Extraocular muscles intact. No scleral icterus.  MOUTH: Moist mucosal membranes. Dentition intact. No abscess noted. EARS, NOSE, THROAT: Clear without exudates. No external lesions.  NECK: Supple. No thyromegaly. No nodules. No JVD.  PULMONARY: Clear to auscultation bilaterally without wheezes, rales, or rhonchi. No use of accessory muscles. Good respiratory effort. CHEST: Nontender to palpation.  CARDIOVASCULAR: S1, S2, regular rate and rhythm. No murmurs, rubs, or gallops.  GASTROINTESTINAL: Soft, nontender, nondistended. No masses. Positive bowel sounds. No hepatosplenomegaly. MUSCULOSKELETAL: No swelling, clubbing, edema. Range of motion full in all extremities. NEUROLOGIC: Cranial nerves II-XII intact. No gross focal neurological deficits. Sensation intact. Reflexes intact. SKIN: No ulcerations, lesions, rash, cyanosis. Skin warm, dry. Turgor intact. PSYCHIATRIC: Mood, affect within normal limits. Patient awake, alert, oriented x 3. Insight and judgment intact.   Assessment/Plan:  Tony Cunningham is an 41 y.o. male with past medical history of hyperlipidemia asthma and uncontrolled diabetes mellitus is admitted to the [facility] for major depressive disorder with psychosis. Hospitalist team is consulted as the patient's Accu-Chek was greater than 450.  # Insulin-dependent diabetes mellitus  uncontrolled Check hemoglobin A1c Patient has just received her NovoLog 30 units at 4:30 PM, repeat Accu-Chek at around 360 according to the nurse's report Patient is asymptomatic Stat BMP and CBC ordered which are pending at this time Patients outpatient Diabetes medications: Invokana 300 mg QAM, Lantus 22 untis QHS, Novolog 15 units TID with meals, Metformin 500 mg BID patient's NovoLog 15 units 3 times a day with meals was resumed at the time of admission including metformin 500 mg by mouth twice a day. Patient is on moderate dose insulin sliding scale Lantus dose increased from 22 units to 30 units daily at bedtime Diabetic coordinator is following   # HTN- continue home medication lisinopril hydrochlorothiazide and titrate as  needed  #Hyperlipidemia continue Zocor home medication  #History of asthma-continue home medications Singulair  # Tobacco abuse disorder - nicotine patch 21 mg daily     plan of care discussed with the patient and RN     TOTAL TIME TAKING CARE OF THIS PATIENT: 41 minutes.   Note: This dictation was prepared with Dragon dictation along with smaller phrase technology. Any transcriptional errors that result from this process are unintentional.   @[facility] @ Pager - [phone] 07/07/2016, 5:45 PM

## 2016-07-07 NOTE — Plan of Care (Signed)
Problem: Activity: Goal: Sleeping patterns will improve Outcome: Progressing Patient slept for Estimated Hours of 6.45; every 15 minutes safety round maintained, no injury or falls during this shift.    

## 2016-07-07 NOTE — Progress Notes (Signed)
Inpatient Diabetes Program Recommendations  AACE/ADA: New Consensus Statement on Inpatient Glycemic Control (2015)  Target Ranges:  Prepandial:   less than 140 mg/dL      Peak postprandial:   less than 180 mg/dL (1-2 hours)      Critically ill patients:  140 - 180 mg/dL   Results for Tony Cunningham, Tony Cunningham (MRN [id number]) as of 07/07/2016 09:38  Ref. Range 07/06/2016 06:43 07/06/2016 11:33 07/06/2016 16:07 07/06/2016 21:11 07/07/2016 06:37  Glucose-Capillary Latest Ref Range: 65 - 99 mg/dL 314 (H) 970 (H) 263 (H) 385 (H) 295 (H)  Review of Glycemic Control  Diabetes history: DM2 Outpatient Diabetes medications: Invokana 300 mg QAM, Lantus 22 untis QHS, Novolog 15 units TID with meals, Metformin 500 mg BID Current orders for Inpatient glycemic control: Lantus 22 units QHS, Novolog 0-15 units TID with meals, Novolog 0-5 units QHS, Metformin 500 mg BID  Inpatient Diabetes Program Recommendations: Insulin - Basal: Please consider increasing Lantus to 30 units QHS. Correction (SSI): Please consider adding Novolog 0-5 units QHS for bedtime correction. Insulin - Meal Coverage: Please consider ordering Novolog 6 units TID with meals for meal coverage.  Thanks, Orlando Penner, RN, MSN, CDE Diabetes Coordinator Inpatient Diabetes Program [phone] (Team Pager from 8am to 5pm)
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
## 2016-07-07 NOTE — Progress Notes (Signed)
Endocrine no longer seeing pts in the hospital.  Trying to contact hospitalist for a consult.

## 2016-07-07 NOTE — Progress Notes (Signed)
Recreation Therapy Notes  Date: 02.13.18 Time: 9:30 am Location: Craft Room  Group Topic: Goal Setting  Goal Area(s) Addresses:  Patient will write at least one goal. Patient will write at least one obstacle.  Behavioral Response: Attentive, Interactive  Intervention: Recovery Goal Chart  Activity: Patients were instructed to make a Recovery Goal Chart including goals, obstacles, the date they started working on their goal, and the date they achieved their goal.  Education: LRT educated patients on healthy ways they can celebrate reaching their goals.  Education Outcome: In group clarification offered  Clinical Observations/Feedback: Patient wrote goals and obstacles. Patient contributed to group discussion by stating that he does not always want to work on his goals.   Jacquelynn Cree, LRT/CTRS 07/07/2016 10:15 AM

## 2016-07-07 NOTE — Progress Notes (Signed)
Patient ID: Tony Cunningham, male   DOB: [birth year], 41 y.o.   MRN: [id number] Appearance: rough looking and disheveled, lean, noted for lips smacking (TD), CBG=385, received 22 Units of Lantus at bedtime, requested/received Ativan 2 mg PO, PRN for Anxiety/Agitation. Mood and affect pleasant, quiet, withdrawn, minimal interactions with peers. Patient had an incontinence of Bowel "I didn't make it on time to the bathroom ..." assisted to clean up by Ms Annice Pih, MHT.

## 2016-07-08 LAB — GLUCOSE, CAPILLARY
GLUCOSE-CAPILLARY: 135 mg/dL — AB (ref 65–99)
GLUCOSE-CAPILLARY: 89 mg/dL (ref 65–99)
Glucose-Capillary: 296 mg/dL — ABNORMAL HIGH (ref 65–99)
Glucose-Capillary: 177 mg/dL — ABNORMAL HIGH (ref 65–99)
Glucose-Capillary: 284 mg/dL — ABNORMAL HIGH (ref 65–99)

## 2016-07-08 LAB — PROLACTIN: Prolactin: 3.4 ng/mL — ABNORMAL LOW (ref 4.0–15.2)

## 2016-07-08 LAB — HEMOGLOBIN A1C
HEMOGLOBIN A1C: 12.8 % — AB (ref 4.8–5.6)
Mean Plasma Glucose: 321 mg/dL

## 2016-07-08 MED ORDER — INSULIN GLARGINE 100 UNIT/ML ~~LOC~~ SOLN
30.0000 [IU] | Freq: Every day | SUBCUTANEOUS | Status: DC
  Filled 2016-07-08 (×2): qty 0.3

## 2016-07-08 MED ORDER — OLANZAPINE 10 MG PO TABS
10.0000 mg | ORAL_TABLET | Freq: Every day | ORAL | Status: DC
  Administered 2016-07-08 – 2016-07-12 (×5): 10 mg via ORAL
  Filled 2016-07-08 (×5): qty 1

## 2016-07-08 MED ORDER — INSULIN GLARGINE 100 UNIT/ML ~~LOC~~ SOLN
36.0000 [IU] | Freq: Every day | SUBCUTANEOUS | Status: DC
Start: 2016-07-08 — End: 2016-07-08
  Filled 2016-07-08: qty 0.36

## 2016-07-08 NOTE — Progress Notes (Signed)
Patient ID: Tony Cunningham, male   DOB: 1975-09-30, 41 y.o.   MRN: [id number] Still disheveled, quiet, pleasant, no behavioral problems, denied pain, denied SI/HI, denied AV/H; fixated on medications at bedtime, medication adjustments discussed, CBG=240, Lantus given as scheduled. Patient was reminded to call for help, explained why he was moved closer to the nurses' station, the reason for [facility]; all the changes were made to prevent a Fall. Patient out of his room about midnight with bottom of his scrub mid way, confused, sleep walking, unaware of his surroundings, Officer Juliette Alcide assisted to show him to the day room to fetch water and back to his room; medications regimen review and adjustment may be needed to reduce delirium.

## 2016-07-08 NOTE — Progress Notes (Signed)
Recreation Therapy Notes  Date: 02.14.18 Time: 9:30 am Location: Craft Room  Group Topic: Self-esteem  Goal Area(s) Addresses:  Patient will write at least one positive trait about self. Patient will verbalize benefit of having a healthy self-esteem.  Behavioral Response: Did not attend  Intervention: I Am  Activity: Patients were given a worksheet with the letter I on it and were instructed to write as many positive traits inside the letter.  Education: LRT educated patients on ways they can increase their self-esteem.  Education Outcome: Patient did not attend group.   Clinical Observations/Feedback: Patient did not attend group.  Jacquelynn Cree, LRT/CTRS 07/08/2016 9:58 AM

## 2016-07-08 NOTE — Plan of Care (Signed)
Problem: Activity: Goal: Sleeping patterns will improve Outcome: Progressing Patient slept for Estimated Hours of 5.45; every 15 minutes safety round maintained, no injury or falls during this shift.    

## 2016-07-08 NOTE — Progress Notes (Addendum)
[facility] MD Progress Note  07/08/2016 9:11 AM Tony Cunningham  MRN:  [id number] Subjective:  41 y/o male came to our ER on 2/11 by EMS due to pain in back of head, stated he had trouble concentrating and remembering things for past 3-4. Pt stated he had been shaking for past 3 days and had not slept in 4 days. Patient reported auditory hallucinations telling him to walk into traffic.  He later reported having command hallucinations telling him to cut himself and bleed out.  Said he had prior suicidal attempts in the past which were commanded by hallucinations.   Patient lives in a group home [phone] ([place]) and has been there since December 2017. Prior to that he was living with his mother in [place].  Patient has peers support services through [facility] in [place]. He goes to [facility] in [place].  2/14 patient was seen during treatment team today. He says that he has been is sleeping and eating okay. He says that he hasn't had any hallucinations in 2 days. He denies having suicidality, homicidality. He denies having side effects from medications other than tremors. He denies any physical complaints. He does continue to feel depressed and rates his mood as a 7 out of 10. Feels that his mood has not improved since admission. He appeared confused. He told us today was 07/03/2016. Says that he was in [facility], he couldn't tell me why he was in the hospital or in which part of the hospital he wants. He said that he was only here for treatment. When I ask him what holiday was today he said he was Gwynne Edinger (today is actually [employer])   Per nursing: Still disheveled, quiet, pleasant, no behavioral problems, denied pain, denied SI/HI, denied AV/H; fixated on medications at bedtime, medication adjustments discussed, CBG=240, Lantus given as scheduled. Patient was reminded to call for help, explained why he was moved closer to the  nurses' station, the reason for [facility]; all the changes were made to prevent a Fall. Patient out of his room about midnight with bottom of his scrub mid way, confused, sleep walking, unaware of his surroundings, Officer Rip Harbour assisted to show him to the day room to fetch water and back to his room; medications regimen review and adjustment may be needed to reduce delirium.  Principal Problem: Schizoaffective disorder ([place]) Diagnosis:   Patient Active Problem List   Diagnosis Date Noted  . Tobacco use disorder [F17.200] 07/07/2016  . Dyslipidemia [E78.5] 07/07/2016  . Asthma [J45.909] 07/07/2016  . Schizoaffective disorder ([place]) [F25.9] 07/07/2016  . HTN (hypertension) [I10] 07/06/2016  . Diabetes ([place]) [E11.9] 12/25/2010   Total Time spent with patient: 30 minutes  Past Psychiatric History: Patient has a long history of mental illness and has had multiple hospitalizations including multiple hospitalizations in [place]. Most of those records are unavailable to me. 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 Not sure whether he ever returns to an adequate baseline. He tells me that he has tried to cut himself and kill himself in the past. Denies any history of violence. He can't remember the names of any of his medicines. Outpatient medicines listed on admission included Abilify and Effexor.  Also hospitalized at [facility], [place].  Per review of records looks like he has been only diagnosed with major depressive disorder, he also has been diagnosed with polysubstance abuse that he has abused  prescription medications.   Past Medical History: Has diabetes and is insulin-dependent. Also high blood pressure and possible COPD Past Medical History:  Diagnosis Date  . Anxiety   . Asthma   . Diabetes mellitus   . High blood pressure   . Sinus complaint    History reviewed. No pertinent surgical history.  Family History: History  reviewed. No pertinent family history.  Family Psychiatric  History: unknown pt was uncooperative  Social History: Living in a group home. Has been there about 4-6 months he estimates. Says that he does not like it.  11 grade education.  Does not have a legal guardian. History  Alcohol Use No     History  Drug Use No    Social History   Social History  . Marital status: Single    Spouse name: [name]  . Number of children: [name]  . Years of education: [name]   Social History Main Topics  . Smoking status: Current Every Day Smoker    Packs/day: 0.50    Types: Cigarettes  . Smokeless tobacco: Never Used  . Alcohol use No  . Drug use: No  . Sexual activity: Not Currently   Other Topics Concern  . None   Social History Narrative  . None     Current Medications: Current Facility-Administered Medications  Medication Dose Route Frequency Provider Last Rate Last Dose  . acetaminophen (TYLENOL) tablet 650 mg  650 mg Oral Q6H PRN Gonzella Lex, MD   650 mg at 07/06/16 2120  . alum & mag hydroxide-simeth (MAALOX/MYLANTA) 200-200-20 MG/5ML suspension 30 mL  30 mL Oral Q4H PRN Gonzella Lex, MD      . amantadine (SYMMETREL) capsule 100 mg  100 mg Oral BID Hildred Priest, MD   100 mg at 07/08/16 0745  . clonazePAM (KLONOPIN) tablet 0.5 mg  0.5 mg Oral QHS Hildred Priest, MD   0.5 mg at 07/07/16 2029  . lisinopril (PRINIVIL,ZESTRIL) tablet 20 mg  20 mg Oral Daily Gonzella Lex, MD   20 mg at 07/08/16 0745   And  . hydrochlorothiazide (HYDRODIURIL) tablet 25 mg  25 mg Oral Daily Gonzella Lex, MD   25 mg at 07/08/16 0745  . insulin aspart (novoLOG) injection 0-15 Units  0-15 Units Subcutaneous TID WC Gonzella Lex, MD   8 Units at 07/08/16 [phone]  . insulin aspart (novoLOG) injection 0-5 Units  0-5 Units Subcutaneous QHS Hildred Priest, MD   5 Units at 07/07/16 2148  . insulin aspart (novoLOG) injection 15 Units  15 Units Subcutaneous TID WC Hildred Priest, MD   15 Units at 07/08/16 0817  . insulin glargine (LANTUS) injection 36 Units  36 Units Subcutaneous QHS Srikar Sudini, MD      . magnesium hydroxide (MILK OF MAGNESIA) suspension 30 mL  30 mL Oral Daily PRN Gonzella Lex, MD      . metFORMIN (GLUCOPHAGE) tablet 500 mg  500 mg Oral BID WC Gonzella Lex, MD   500 mg at 07/08/16 0745  . montelukast (SINGULAIR) tablet 10 mg  10 mg Oral QHS Gonzella Lex, MD   10 mg at 07/07/16 2030  . nicotine (NICODERM CQ - dosed in mg/24 hours) patch 21 mg  21 mg Transdermal Daily Gonzella Lex, MD   21 mg at 07/08/16 0748  . OLANZapine (ZYPREXA) tablet 7.5 mg  7.5 mg Oral QHS Hildred Priest, MD   7.5 mg at 07/07/16 2029  . simvastatin (ZOCOR) tablet  40 mg  40 mg Oral QHS Gonzella Lex, MD   40 mg at 07/07/16 2148  . venlafaxine XR (EFFEXOR-XR) 24 hr capsule 75 mg  75 mg Oral Q breakfast Gonzella Lex, MD   75 mg at 07/08/16 0745    Lab Results:  Results for orders placed or performed during the hospital encounter of 07/06/16 (from the past 48 hour(s))  Glucose, capillary     Status: Abnormal   Collection Time: 07/06/16  9:11 PM  Result Value Ref Range   Glucose-Capillary 385 (H) 65 - 99 mg/dL  Glucose, capillary     Status: Abnormal   Collection Time: 07/07/16  6:37 AM  Result Value Ref Range   Glucose-Capillary 295 (H) 65 - 99 mg/dL  Lipid panel     Status: Abnormal   Collection Time: 07/07/16  6:45 AM  Result Value Ref Range   Cholesterol 120 0 - 200 mg/dL   Triglycerides 153 (H) <150 mg/dL   HDL 33 (L) >40 mg/dL   Total CHOL/HDL Ratio 3.6 RATIO   VLDL 31 0 - 40 mg/dL   LDL Cholesterol 56 0 - 99 mg/dL    Comment:        Total Cholesterol/HDL:CHD Risk Coronary Heart Disease Risk Table                     Men   Women  1/2 Average Risk   3.4   3.3  Average Risk       5.0   4.4  2 X Average Risk   9.6   7.1  3 X Average Risk  23.4   11.0        Use the calculated Patient Ratio above and the CHD Risk  Table to determine the patient's CHD Risk.        ATP III CLASSIFICATION (LDL):  <100     mg/dL   Optimal  100-129  mg/dL   Near or Above                    Optimal  130-159  mg/dL   Borderline  160-189  mg/dL   High  >190     mg/dL   Very High   Prolactin     Status: Abnormal   Collection Time: 07/07/16  6:45 AM  Result Value Ref Range   Prolactin 3.4 (L) 4.0 - 15.2 ng/mL    Comment: (NOTE) Performed At: [facility] [place], [place] [id number] Lindon Romp MD [id number]   TSH     Status: None   Collection Time: 07/07/16  6:45 AM  Result Value Ref Range   TSH 2.739 0.350 - 4.500 uIU/mL    Comment: Performed by a 3rd Generation assay with a functional sensitivity of <=0.01 uIU/mL.  Glucose, capillary     Status: Abnormal   Collection Time: 07/07/16 11:27 AM  Result Value Ref Range   Glucose-Capillary 422 (H) 65 - 99 mg/dL   Comment 1 Call MD NNP PA CNM   Glucose, capillary     Status: Abnormal   Collection Time: 07/07/16  1:04 PM  Result Value Ref Range   Glucose-Capillary 475 (H) 65 - 99 mg/dL  Glucose, capillary     Status: Abnormal   Collection Time: 07/07/16  2:24 PM  Result Value Ref Range   Glucose-Capillary 446 (H) 65 - 99 mg/dL  Glucose, capillary     Status: Abnormal   Collection Time: 07/07/16  4:26 PM  Result Value Ref Range   Glucose-Capillary 442 (H) 65 - 99 mg/dL  Glucose, capillary     Status: Abnormal   Collection Time: 07/07/16  5:09 PM  Result Value Ref Range   Glucose-Capillary 395 (H) 65 - 99 mg/dL  Basic metabolic panel     Status: Abnormal   Collection Time: 07/07/16  5:25 PM  Result Value Ref Range   Sodium 131 (L) 135 - 145 mmol/L   Potassium 3.7 3.5 - 5.1 mmol/L   Chloride 91 (L) 101 - 111 mmol/L   CO2 30 22 - 32 mmol/L   Glucose, Bld 326 (H) 65 - 99 mg/dL   BUN 16 6 - 20 mg/dL   Creatinine, Ser 1.00 0.61 - 1.24 mg/dL   Calcium 9.2 8.9 - 10.3 mg/dL   GFR calc non Af Amer >60 >60 mL/min   GFR calc Af Amer  >60 >60 mL/min    Comment: (NOTE) The eGFR has been calculated using the CKD EPI equation. This calculation has not been validated in all clinical situations. eGFR's persistently <60 mL/min signify possible Chronic Kidney Disease.    Anion gap 10 5 - 15  CBC     Status: None   Collection Time: 07/07/16  5:25 PM  Result Value Ref Range   WBC 7.0 3.8 - 10.6 K/uL   RBC 5.54 4.40 - 5.90 MIL/uL   Hemoglobin 17.1 13.0 - 18.0 g/dL   HCT 49.8 40.0 - 52.0 %   MCV 89.9 80.0 - 100.0 fL   MCH 30.9 26.0 - 34.0 pg   MCHC 34.4 32.0 - 36.0 g/dL   RDW 13.2 11.5 - 14.5 %   Platelets 226 150 - 440 K/uL  Glucose, capillary     Status: Abnormal   Collection Time: 07/07/16  6:32 PM  Result Value Ref Range   Glucose-Capillary 336 (H) 65 - 99 mg/dL  Glucose, capillary     Status: Abnormal   Collection Time: 07/07/16  8:27 PM  Result Value Ref Range   Glucose-Capillary 240 (H) 65 - 99 mg/dL  Glucose, capillary     Status: Abnormal   Collection Time: 07/08/16  6:48 AM  Result Value Ref Range   Glucose-Capillary 296 (H) 65 - 99 mg/dL    Blood Alcohol level:  Lab Results  Component Value Date   ETH <5 74/94/4967    Metabolic Disorder Labs: Lab Results  Component Value Date   HGBA1C 10.0 (H) 02/09/2016   MPG 240 02/09/2016   Lab Results  Component Value Date   PROLACTIN 3.4 (L) 07/07/2016   PROLACTIN 3.0 (L) 02/08/2016   Lab Results  Component Value Date   CHOL 120 07/07/2016   TRIG 153 (H) 07/07/2016   HDL 33 (L) 07/07/2016   CHOLHDL 3.6 07/07/2016   VLDL 31 07/07/2016   LDLCALC 56 07/07/2016   LDLCALC 99 02/08/2016    Physical Findings: AIMS: Facial and Oral Movements Muscles of Facial Expression: Mild Lips and Perioral Area: Moderate Jaw: Mild Tongue: Minimal,Extremity Movements Upper (arms, wrists, hands, fingers): None, normal Lower (legs, knees, ankles, toes): None, normal, Trunk Movements Neck, shoulders, hips: None, normal, Overall Severity Severity of abnormal  movements (highest score from questions above): Minimal Incapacitation due to abnormal movements: None, normal Patient's awareness of abnormal movements (rate only patient's report): No Awareness, Dental Status Current problems with teeth and/or dentures?: No Does patient usually wear dentures?: No (Patient on Cogentin)  CIWA:    COWS:     Musculoskeletal:  Strength & Muscle Tone: within normal limits Gait & Station: shuffle Patient leans: [name]  Psychiatric Specialty Exam: Physical Exam  Constitutional: He is oriented to person, place, and time. He appears well-developed and well-nourished.  HENT:  Head: Normocephalic and atraumatic.  Eyes: Conjunctivae and EOM are normal.  Neck: Normal range of motion.  Respiratory: Effort normal.  Musculoskeletal: Normal range of motion.  Neurological: He is alert and oriented to person, place, and time.    Review of Systems  Constitutional: Negative.   HENT: Negative.   Eyes: Negative.   Respiratory: Negative.   Cardiovascular: Negative.   Gastrointestinal: Negative.   Genitourinary: Negative.   Skin: Negative.   Neurological: Negative.   Endo/Heme/Allergies: Negative.   Psychiatric/Behavioral: Positive for depression. Negative for hallucinations, memory loss, substance abuse and suicidal ideas. The patient is nervous/anxious. The patient does not have insomnia.     Blood pressure 104/76, pulse 89, temperature 98.6 F (37 C), temperature source Oral, resp. rate 18, height 6' (1.829 m), weight 74.8 kg (165 lb), SpO2 100 %.Body mass index is 22.38 kg/m.  General Appearance: Fairly Groomed  Eye Contact:  Minimal  Speech:  Slow  Volume:  Decreased  Mood:  Dysphoric  Affect:  Blunt  Thought Process:  Linear and Descriptions of Associations: Intact  Orientation:  Full (Time, Place, and Person)  Thought Content:  Hallucinations: None  Suicidal Thoughts:  No  Homicidal Thoughts:  No  Memory:  Immediate;   Poor Recent;   Poor Remote;    Poor  Judgement:  Impaired  Insight:  Lacking  Psychomotor Activity:  Decreased  Concentration:  Concentration: Poor and Attention Span: Poor  Recall:  Poor  Fund of Knowledge:  Poor  Language:  Poor  Akathisia:  No  Handed:    AIMS (if indicated):     Assets:  Agricultural consultant Housing  ADL's:  Intact  Cognition:  Impaired,  Mild  Sleep:  Number of Hours: 5.45     Treatment Plan Summary:  41 y/o male with depression, SI and hallucinations.  Multiple prior hospitalization and prior suicidal attempts.  Likely Schizoaffective diosorder.  Per review of records patient has history of abusing opiates, Lyrica, Remeron and Flexeril. Here he has been frequently asking for Ativan or clonazepam.  Depressive symptoms: continue Effexor 75 mg daily  Psychosis: will d/c abilify as he could have been experiencing akathisia.  He has been started on olanzapine. He yesterday he received 7.5 mg. Today I'll plan to increase the dose to 10 mg daily at bedtime  Akathisia: will d/c benztropine and use amantadine 100 mg po bid  Insomnia: Continue  klonopin 0.5 mg qhs   History of abusing opiates, Flexeril, Lyrica and mirtazapine. We'll benefit from continuing outpatient substance abuse services. We'll not discharge him controlled substances.   Agitation: patient has not been agitated. Orders for Ativan had been discontinued as he appears confused   For diabetes the patient will be started on NovoLog 15 units 3 times a day with meals. Lantus will be increased to 30 units at bedtime. Patient has orders for supplemental insulin.  Also continue metformin 500 mg twice a day.   Patient is currently being followed by hospitalist service  Hypertension: Continue lisinopril 20 mg a day and hydrochlorothiazide 25 mg a day  Dyslipidemia continue Zocor 40 mg at bedtime  Asthma continue Singulair 10 mg at bedtime  Tobacco use disorder continue nicotine patch 21 mg  a day  Unsteady gait: No evidence of unsteady  gait today. Continue  fall precautions  Labs: hemoglobin A1c, lipid panel, TSH all completed.  Diet carb modified and low sodium  Vital signs daily  Precautions every 15 minute checks  Hospitalization status involuntary  Disposition: Once stable the patient will be discharged back to his group home. Today I contacted the group home. They reported the patient is not problematic. He is welcome back  Follow up: continue to f/u with [facility] in [place].  Continue with peers support through [facility] in [place].  [facility] is [name] phone number is [phone].   Hildred Priest, MD 07/08/2016, 9:11 AM

## 2016-07-08 NOTE — [facility] Suicide Risk Assessment (Signed)
[facility] INPATIENT:  Family/Significant Other Suicide Prevention Education  Suicide Prevention Education:  Education Completed; Tami Lin, Group Home staff,  (name of family member/significant other) has been identified by the patient as the family member/significant other with whom the patient will be residing, and identified as the person(s) who will aid the patient in the event of a mental health crisis (suicidal ideations/suicide attempt).  With written consent from the patient, the family member/significant other has been provided the following suicide prevention education, prior to the and/or following the discharge of the patient.  The suicide prevention education provided includes the following:  Suicide risk factors  Suicide prevention and interventions  National Suicide Hotline telephone number  [facility] assessment telephone number  [facility] Emergency Assistance 911  [facility] and/or Residential Mobile Crisis Unit telephone number  Request made of family/significant other to:  Remove weapons (e.g., guns, rifles, knives), all items previously/currently identified as safety concern.    Remove drugs/medications (over-the-counter, prescriptions, illicit drugs), all items previously/currently identified as a safety concern.  The family member/significant other verbalizes understanding of the suicide prevention education information provided.  The family member/significant other agrees to remove the items of safety concern listed above.  Glennon Mac, MSW, LCSW 07/08/2016, 5:04 PM

## 2016-07-08 NOTE — Progress Notes (Signed)
Pt requesting klonopin for anxiety, klonopin ordered only at bedtime. Pt has been in bed asleep all morning except to come out for meals and treatment team meeting. Dr. Ardyth Harps informed, no new orders at this time. Will continue to monitor.

## 2016-07-08 NOTE — Tx Team (Addendum)
Interdisciplinary Treatment and Diagnostic Plan Update  07/08/2016 Time of Session: 10:15am Tony Cunningham MRN: [id number]  Principal Diagnosis: Schizoaffective disorder [facility])  Secondary Diagnoses: Principal Problem:   Schizoaffective disorder (HCC) Active Problems:   Diabetes (HCC)   HTN (hypertension)   Tobacco use disorder   Dyslipidemia   Asthma   Current Medications:  Current Facility-Administered Medications  Medication Dose Route Frequency Provider Last Rate Last Dose  . acetaminophen (TYLENOL) tablet 650 mg  650 mg Oral Q6H PRN [name], MD   650 mg at 07/06/16 2120  . alum & mag hydroxide-simeth (MAALOX/MYLANTA) 200-200-20 MG/5ML suspension 30 mL  30 mL Oral Q4H PRN [name], MD      . amantadine (SYMMETREL) capsule 100 mg  100 mg Oral BID [name], MD   100 mg at 07/08/16 0745  . clonazePAM (KLONOPIN) tablet 0.5 mg  0.5 mg Oral QHS [name], MD   0.5 mg at 07/07/16 2029  . lisinopril (PRINIVIL,ZESTRIL) tablet 20 mg  20 mg Oral Daily [name], MD   20 mg at 07/08/16 0745   And  . hydrochlorothiazide (HYDRODIURIL) tablet 25 mg  25 mg Oral Daily [name], MD   25 mg at 07/08/16 0745  . insulin aspart (novoLOG) injection 0-15 Units  0-15 Units Subcutaneous TID WC [name], MD   2 Units at 07/08/16 1207  . insulin aspart (novoLOG) injection 0-5 Units  0-5 Units Subcutaneous QHS [name], MD   5 Units at 07/07/16 2148  . insulin aspart (novoLOG) injection 15 Units  15 Units Subcutaneous TID WC [name], MD   15 Units at 07/08/16 1207  . insulin glargine (LANTUS) injection 30 Units  30 Units Subcutaneous QHS Srikar Sudini, MD      . magnesium hydroxide (MILK OF MAGNESIA) suspension 30 mL  30 mL Oral Daily PRN [name], MD      . metFORMIN (GLUCOPHAGE) tablet 500 mg  500 mg Oral BID WC [name], MD   500 mg at 07/08/16 0745  . montelukast (SINGULAIR) tablet 10 mg  10 mg Oral  QHS [name], MD   10 mg at 07/07/16 2030  . nicotine (NICODERM CQ - dosed in mg/24 hours) patch 21 mg  21 mg Transdermal Daily [name], MD   21 mg at 07/08/16 0748  . OLANZapine (ZYPREXA) tablet 10 mg  10 mg Oral QHS [name], MD      . simvastatin (ZOCOR) tablet 40 mg  40 mg Oral QHS [name], MD   40 mg at 07/07/16 2148  . venlafaxine XR (EFFEXOR-XR) 24 hr capsule 75 mg  75 mg Oral Q breakfast [name], MD   75 mg at 07/08/16 0745   PTA Medications: Prescriptions Prior to Admission  Medication Sig Dispense Refill Last Dose  . ARIPiprazole (ABILIFY) 10 MG tablet Take 1 tablet (10 mg total) by mouth daily. For mood control 30 tablet 0   . benztropine (COGENTIN) 1 MG tablet Take 1 tablet (1 mg total) by mouth 2 (two) times daily. For prevention of drug induced tremors 60 tablet 0   . canagliflozin (INVOKANA) 300 MG TABS tablet Take 1 tablet (300 mg total) by mouth daily before breakfast. For diabetes management 30 tablet 0   . diclofenac sodium (VOLTAREN) 1 % GEL Apply 2 g topically 2 (two) times daily. For arthritic pain     . hydrOXYzine (ATARAX/VISTARIL) 25 MG tablet Take 1  tablet (25 mg total) by mouth 3 (three) times daily as needed for anxiety. 60 tablet 0   . insulin aspart (NOVOLOG) 100 UNIT/ML injection Inject 15 Units into the skin 3 (three) times daily with meals. For diabetes management 10 mL 0   . insulin glargine (LANTUS) 100 UNIT/ML injection Inject 0.22 mLs (22 Units total) into the skin at bedtime. For diabetes management 10 mL 0   . lisinopril-hydrochlorothiazide (PRINZIDE,ZESTORETIC) 20-25 MG tablet Take 1 tablet by mouth daily. For high blood pressure 30 tablet 0   . metFORMIN (GLUCOPHAGE) 500 MG tablet Take 1 tablet (500 mg total) by mouth 2 (two) times daily with a meal. For diabetes management 60 tablet 0   . montelukast (SINGULAIR) 10 MG tablet Take 1 tablet (10 mg total) by mouth at bedtime. For Asthma 30 tablet 0   . nicotine  (NICODERM CQ - DOSED IN MG/24 HOURS) 21 mg/24hr patch Place 1 patch (21 mg total) onto the skin daily. For smoking cessation 28 patch 0   . pregabalin (LYRICA) 75 MG capsule Take 1 capsule (75 mg total) by mouth 2 (two) times daily. For pain 16 capsule 0   . simvastatin (ZOCOR) 40 MG tablet Take 1 tablet (40 mg total) by mouth at bedtime. For high cholesterol 15 tablet 0   . traZODone (DESYREL) 100 MG tablet Take 1 tablet (100 mg total) by mouth at bedtime as needed for sleep. (Patient taking differently: Take 150 mg by mouth at bedtime as needed for sleep. ) 30 tablet 0   . venlafaxine XR (EFFEXOR-XR) 37.5 MG 24 hr capsule Take 5 capsules (187.5 mg total) by mouth daily with breakfast. For depression (Patient taking differently: Take 150 mg by mouth daily with breakfast. For depression) 150 capsule 0     Patient Stressors: Health problems Medication change or noncompliance  Patient Strengths: Capable of independent living Physical Health  Treatment Modalities: Medication Management, Group therapy, Case management,  1 to 1 session with clinician, Psychoeducation, Recreational therapy.   Physician Treatment Plan for Primary Diagnosis: Schizoaffective disorder (HCC) Long Term Goal(s): Improvement in symptoms so as ready for discharge Improvement in symptoms so as ready for discharge   Short Term Goals: Ability to identify changes in lifestyle to reduce recurrence of condition will improve Ability to verbalize feelings will improve Ability to disclose and discuss suicidal ideas Ability to demonstrate self-control will improve Ability to disclose and discuss suicidal ideas Ability to demonstrate self-control will improve Ability to identify and develop effective coping behaviors will improve  Medication Management: Evaluate patient's response, side effects, and tolerance of medication regimen.  Therapeutic Interventions: 1 to 1 sessions, Unit Group sessions and Medication  administration.  Evaluation of Outcomes: Progressing  Physician Treatment Plan for Secondary Diagnosis: Principal Problem:   Schizoaffective disorder (HCC) Active Problems:   Diabetes (HCC)   HTN (hypertension)   Tobacco use disorder   Dyslipidemia   Asthma  Long Term Goal(s): Improvement in symptoms so as ready for discharge Improvement in symptoms so as ready for discharge   Short Term Goals: Ability to identify changes in lifestyle to reduce recurrence of condition will improve Ability to verbalize feelings will improve Ability to disclose and discuss suicidal ideas Ability to demonstrate self-control will improve Ability to disclose and discuss suicidal ideas Ability to demonstrate self-control will improve Ability to identify and develop effective coping behaviors will improve     Medication Management: Evaluate patient's response, side effects, and tolerance of medication regimen.  Therapeutic Interventions: 1  to 1 sessions, Unit Group sessions and Medication administration.  Evaluation of Outcomes: Progressing   RN Treatment Plan for Primary Diagnosis: Schizoaffective disorder (HCC) Long Term Goal(s): Knowledge of disease and therapeutic regimen to maintain health will improve  Short Term Goals: Ability to verbalize frustration and anger appropriately will improve, Ability to demonstrate self-control, Ability to disclose and discuss suicidal ideas, Ability to identify and develop effective coping behaviors will improve and Compliance with prescribed medications will improve  Medication Management: RN will administer medications as ordered by provider, will assess and evaluate patient's response and provide education to patient for prescribed medication. RN will report any adverse and/or side effects to prescribing provider.  Therapeutic Interventions: 1 on 1 counseling sessions, Psychoeducation, Medication administration, Evaluate responses to treatment, Monitor vital  signs and CBGs as ordered, Perform/monitor CIWA, COWS, AIMS and Fall Risk screenings as ordered, Perform wound care treatments as ordered.  Evaluation of Outcomes: Progressing   LCSW Treatment Plan for Primary Diagnosis: Schizoaffective disorder (HCC) Long Term Goal(s): Safe transition to appropriate next level of care at discharge, Engage patient in therapeutic group addressing interpersonal concerns.  Short Term Goals: Engage patient in aftercare planning with referrals and resources, Increase social support, Increase ability to appropriately verbalize feelings and Increase skills for wellness and recovery  Therapeutic Interventions: Assess for all discharge needs, 1 to 1 time with Social worker, Explore available resources and support systems, Assess for adequacy in community support network, Educate family and significant other(s) on suicide prevention, Complete Psychosocial Assessment, Interpersonal group therapy.  Evaluation of Outcomes: Progressing    Recreational Therapy Treatment Plan for Primary Diagnosis: Schizoaffective disorder (HCC) Long Term Goal(s): Interest or engagement in recreation therapeutic interventions will improve  Short Term Goals: Increase self-esteem, Increase stress management skills  Treatment Modalities: Group Therapy and Individual Treatment Sessions  Therapeutic Interventions: Psychoeducation  Evaluation of Outcomes: Progressing   Progress in Treatment: Attending groups: No. Participating in groups: No. Taking medication as prescribed: Yes. Toleration medication: Yes. Family/Significant other contact made: Yes, individual(s) contacted:   [facility] Patient understands diagnosis: Yes. Discussing patient identified problems/goals with staff: Yes. Medical problems stabilized or resolved: Yes. Denies suicidal/homicidal ideation: Yes. Issues/concerns per patient self-inventory: No. Other:    New problem(s) identified: No, Describe:      New Short Term/Long Term Goal(s):  Discharge Plan or Barriers:   Reason for Continuation of Hospitalization: Anxiety Delusions  Depression Hallucinations  Estimated Length of Stay:5-7 days  Attendees: Patient: Tony Cunningham 07/08/2016 5:25 PM  Physician: Ardyth Harps 07/08/2016 5:25 PM  Nursing: Leonia Reader 07/08/2016 5:25 PM  RN Care Manager: 07/08/2016 5:25 PM  Social Worker: Marshell Garfinkel 07/08/2016 5:25 PM  Recreational Therapist: Hershal Coria, LRT/CTRS 07/08/2016 5:25 PM  Other:  07/08/2016 5:25 PM  Other:  07/08/2016 5:25 PM  Other: 07/08/2016 5:25 PM    Scribe for Treatment Team: Glennon Mac, LCSW 07/08/2016 5:25 PM

## 2016-07-08 NOTE — [facility] Group Notes (Signed)
[facility] Group Notes:  (Nursing/MHT/Case Management/Adjunct)  Date:  07/08/2016  Time:  4:20 AM  Type of Therapy:  Psychoeducational Skills  Participation Level:  Active  Participation Quality:  Appropriate, Attentive and Sharing  Affect:  Appropriate  Cognitive:  Appropriate  Insight:  Appropriate  Engagement in Group:  Engaged  Modes of Intervention:  Discussion, Socialization and Support  Summary of Progress/Problems:  Tony Cunningham 07/08/2016, 4:20 AM

## 2016-07-08 NOTE — Progress Notes (Signed)
Pt awake, alert, oriented. Isolative to room, requesting to stay in bed and sleep this morning, but up out of bed appropriately interacting with staff/peers this afternoon. Went outside for group, but did not attend other groups today. Denies SI/HI/AVH. Medication compliant. Lip smacking noted this afternoon. Appetite good, blood glucose improving with insulin regimen. Pt is pleasant and cooperative with nurse, appears to be ambulating better than yesterday, steady.   Support and encouragement provided. medication administered with education. Safety maintained with every 15 minute checks. Will continue to monitor.

## 2016-07-08 NOTE — B[facility] Counselor (Signed)
Adult Comprehensive Assessment  Patient ID: Tony Cunningham, male   DOB: [birth year], 41 y.o.   MRN: [id number]  Information Source: Information source: Patient  Current Stressors:  Family Relationships: (P) limited support Financial / Lack of resources (include bankruptcy): fixed income Social relationships: limited supports  Living/Environment/Situation:  Living Arrangements: Group Home What is atmosphere in current home: Chaotic  Family History:  Marital status: Single Are you sexually active?: Yes What is your sexual orientation?: heterosexual Has your sexual activity been affected by drugs, alcohol, medication, or emotional stress?: none Does patient have children?: No How many children?: 0  Childhood History:  By whom was/is the patient raised?: Both parents Additional childhood history information: Pt reports having a good childhood.  No issues reported.  Description of patient's relationship with caregiver when they were a child: Pt reports being closer to his father.  Witnessed domestic violence?: No Has patient been effected by domestic violence as an adult?: No  Education:  Highest grade of school patient has completed: 11th Grade  Employment/Work Situation:   Employment situation: On disability Why is patient on disability: mental health How long has patient been on disability: "all my life" Patient's job has been impacted by current illness: No What is the longest time patient has a held a job?: [employer] Where was the patient employed at that time?: 8-11 years Has patient ever been in the [employer]?: No Has patient ever served in combat?: No Did You Receive Any Psychiatric Treatment/Services While in Equities trader?: No Are There Guns or Other Weapons in Your Home?: No  Financial Resources:   Surveyor, quantity resources: Insurance claims handler  Alcohol/Substance Abuse:   If attempted suicide, did drugs/alcohol play a role in this?: No Alcohol/Substance Abuse  Treatment Hx: Denies past history  Social Support System:   Forensic psychologist System: Estate agent:   Leisure and Hobbies: video games  Strengths/Needs:      Discharge Plan:   Does patient have access to transportation?:  ([facility] ) Currently receiving community mental health services: Yes (From Whom) ([facility],    [facility] [employer] [facility], Office manager through [facility].) If no, would patient like referral for services when discharged?: No Does patient have financial barriers related to discharge medications?: No  Summary/Recommendations:   Summary and Recommendations (to be completed by the evaluator): Pt is 41 yo male with history of shizophrenia who was admitted to unitwith complaints of worsening voices.  He was living at [facility] group home and will likely return there at discharge.  While on the unit he will have the opportuni  [employer]. 07/08/2016

## 2016-07-09 LAB — GLUCOSE, CAPILLARY
GLUCOSE-CAPILLARY: 340 mg/dL — AB (ref 65–99)
GLUCOSE-CAPILLARY: 370 mg/dL — AB (ref 65–99)
Glucose-Capillary: 177 mg/dL — ABNORMAL HIGH (ref 65–99)
Glucose-Capillary: 75 mg/dL (ref 65–99)
Glucose-Capillary: 139 mg/dL — ABNORMAL HIGH (ref 65–99)

## 2016-07-09 MED ORDER — INSULIN GLARGINE 100 UNIT/ML ~~LOC~~ SOLN
30.0000 [IU] | Freq: Every day | SUBCUTANEOUS | Status: DC
  Administered 2016-07-09 – 2016-07-12 (×4): 30 [IU] via SUBCUTANEOUS
  Filled 2016-07-09 (×5): qty 0.3

## 2016-07-09 MED ORDER — VENLAFAXINE HCL ER 75 MG PO CP24
150.0000 mg | ORAL_CAPSULE | Freq: Every day | ORAL | Status: DC
  Administered 2016-07-10 – 2016-07-13 (×4): 150 mg via ORAL
  Filled 2016-07-09 (×4): qty 2

## 2016-07-09 NOTE — [facility] Group Notes (Signed)
[facility] LCSW Group Therapy Note  Type of Therapy and Topic:  Group Therapy:  Goals Group: SMART Goals  Participation Level:  Patient did not attend group. CSW invited patient to group.   Description of Group:   The purpose of a daily goals group is to assist and guide patients in setting recovery/wellness-related goals.  The objective is to set goals as they relate to the crisis in which they were admitted. Patients will be using SMART goal modalities to set measurable goals.  Characteristics of realistic goals will be discussed and patients will be assisted in setting and processing how one will reach their goal. Facilitator will also assist patients in applying interventions and coping skills learned in psycho-education groups to the SMART goal and process how one will achieve defined goal.  Therapeutic Goals: -Patients will develop and document one goal related to or their crisis in which brought them into treatment. -Patients will be guided by LCSW using SMART goal setting modality in how to set a measurable, attainable, realistic and time sensitive goal.  -Patients will process barriers in reaching goal. -Patients will process interventions in how to overcome and successful in reaching goal.   Summary of Patient Progress:  Patient Goal: Patient did not attend group. CSW invited patient to group.    Therapeutic Modalities:   Motivational Interviewing Engineer, manufacturing systems Therapy Crisis Intervention Model SMART goals setting   Orel Hord G. Garnette Czech MSW, LCSWA 07/09/2016 11:11 AM

## 2016-07-09 NOTE — Plan of Care (Signed)
Problem: Activity: Goal: Sleeping patterns will improve Outcome: Progressing Patient slept for Estimated Hours of 7; every 15 minutes safety round maintained, no injury or falls during this shift.    

## 2016-07-09 NOTE — Progress Notes (Signed)
Inpatient Diabetes Program Recommendations  AACE/ADA: New Consensus Statement on Inpatient Glycemic Control (2015)  Target Ranges:  Prepandial:   less than 140 mg/dL      Peak postprandial:   less than 180 mg/dL (1-2 hours)      Critically ill patients:  140 - 180 mg/dL   Lab Results  Component Value Date   GLUCAP 340 (H) 07/09/2016   HGBA1C 12.8 (H) 07/07/2016    Review of Glycemic Control  Results for SUSANO, CLECKLER (MRN [id number]) as of 07/09/2016 09:04  Ref. Range 07/08/2016 11:20 07/08/2016 15:08 07/08/2016 16:38 07/08/2016 21:07 07/09/2016 06:51  Glucose-Capillary Latest Ref Range: 65 - 99 mg/dL 235 (H) 361 (H) 443 (H) 89 340 (H)    Diabetes history: DM2 Outpatient Diabetes medications: Invokana 300 mg QAM, Lantus 22 untis QHS, Novolog 15 units TID with meals, Metformin 500 mg BID Current orders for Inpatient glycemic control: Lantus 30 units QHS, Novolog 0-15 units TID with meals, Novolog 0-5 units QHS, Metformin 500 mg BID, Novolog 15 units tid   Fasting blood sugar 340 mg/dl this morning because the Lantus insulin was held last night. DO NOT HOLD BASAL INSULIN.  Continue Lantus insulin as ordered.   Susette Racer, RN, BA, MHA, CDE Diabetes Coordinator Inpatient Diabetes Program  [phone] (Team Pager) [phone] [facility] Office) 07/09/2016 9:17 AM

## 2016-07-09 NOTE — B[facility] Group Notes (Signed)
[facility] Group Notes:  (Nursing/MHT/Case Management/Adjunct)  Date:  07/09/2016  Time:  9:31 PM  Type of Therapy:  Psychoeducational Skills  Participation Level:  Active  Participation Quality:  Appropriate  Affect:  Appropriate  Cognitive:  Appropriate  Insight:  Good  Engagement in Group:  Supportive  Modes of Intervention:  Activity  Summary of Progress/Problems:  Tony Cunningham 07/09/2016, 9:31 PM

## 2016-07-09 NOTE — Progress Notes (Signed)
Recreation Therapy Notes  Date: 02.15.18 Time: 9:30 am Location: Craft Room  Group Topic: Leisure Education  Goal Area(s) Addresses:  Patient will identify things they are grateful for.  Patient will identify how being grateful can influence decision making.  Behavioral Response: Did not attend  Intervention: Grateful Wheel  Activity: Patients were given an I Am Grateful For worksheet and were instructed to write things they are grateful for under each category.  Education: LRT educated patients on leisure and why it is important.  Education Outcome: Patient did not attend group.   Clinical Observations/Feedback: Patient did not attend group.  Jacquelynn Cree, LRT/CTRS 07/09/2016 10:00 AM

## 2016-07-09 NOTE — B[facility] Group Notes (Signed)
[facility] LCSW Group Therapy Note  Date/Time: 07/09/16  Type of Therapy/Topic:  Group Therapy:  Balance in Life  Participation Level:  moderate  Description of Group:    This group will address the concept of balance and how it feels and looks when one is unbalanced. Patients will be encouraged to process areas in their lives that are out of balance, and identify reasons for remaining unbalanced. Facilitators will guide patients utilizing problem- solving interventions to address and correct the stressor making their life unbalanced. Understanding and applying boundaries will be explored and addressed for obtaining  and maintaining a balanced life. Patients will be encouraged to explore ways to assertively make their unbalanced needs known to significant others in their lives, using other group members and facilitator for support and feedback.  Therapeutic Goals: 1. Patient will identify two or more emotions or situations they have that consume much of in their lives. 2. Patient will identify signs/triggers that life has become out of balance:  3. Patient will identify two ways to set boundaries in order to achieve balance in their lives:  4. Patient will demonstrate ability to communicate their needs through discussion and/or role plays  Summary of Patient Progress: PT has some difficulty participating in the group discussion but did identify that he saw less of his family than he would like.  Pt reports that he attends a day program as a way to productively use his time since he is not employed.      Therapeutic Modalities:   Cognitive Behavioral Therapy Solution-Focused Therapy Assertiveness Training  Daleen Squibb, [place]

## 2016-07-09 NOTE — Progress Notes (Signed)
Patient ID: Tony Cunningham, male   DOB: 1975-10-28, 41 y.o.   MRN: [id number] CBG=89, long acting insulin (30Units Lantus) held, appearance still disheveled, no behavioral problems, denied SI/SIB, denied AV/H; fixated on medications. No moment of acute delirium today.

## 2016-07-09 NOTE — Progress Notes (Signed)
[facility] MD Progress Note  07/09/2016 9:41 AM Tony Cunningham  MRN:  [id number] Subjective:  41 y/o male came to our ER on 2/11 by EMS due to pain in back of head, stated he had trouble concentrating and remembering things for past 3-4. Pt stated he had been shaking for past 3 days and had not slept in 4 days. Patient reported auditory hallucinations telling him to walk into traffic.  He later reported having command hallucinations telling him to cut himself and bleed out.  Said he had prior suicidal attempts in the past which were commanded by hallucinations.   Patient lives in a group home 336 [phone] ([place]) and has been there since December 2017. Prior to that he was living with his mother in [place].  Patient has peers support services through [facility] in [place]. He goes to [facility] in [place].  2/14 patient was seen during treatment team today. He says that he has been is sleeping and eating okay. He says that he hasn't had any hallucinations in 2 days. He denies having suicidality, homicidality. He denies having side effects from medications other than tremors. He denies any physical complaints. He does continue to feel depressed and rates his mood as a 7 out of 10. Feels that his mood has not improved since admission. He appeared confused. He told us today was 07/03/2016. Says that he was in [facility], he couldn't tell me why he was in the hospital or in which part of the hospital he wants. He said that he was only here for treatment. When I ask him what holiday was today he said he was Gwynne Edinger (today is actually Valentins)  2/15 patient continues to provide very short and brief responses. Patient tells me he has been eating okay and has been is sleeping well at night. He no longer has hallucinations or suicidal thoughts. He continues to feel depressed and rates his mood as a 7 out of 10 x 10 being the worst. He is  tolerating well medications and denies any side effects. He continues to feel very weak and says that he has tremors. He has been asking for prn medications often especially benzodiazepines. Per nursing staff the patient stated in his room all day yesterday and did not participate in any groups  This morning the patient was found sleeping in his bed and appeared very anergic. He was immediately encouraged to get out of bed and start participating in programming. After that the patient was seen walking around the unit.  Per nursing: CBG=89, long acting insulin (30Units Lantus) held, appearance still disheveled, no behavioral problems, denied SI/SIB, denied AV/H; fixated on medications. No moment of acute delirium today.  Pt awake, alert, oriented. Isolative to room, requesting to stay in bed and sleep this morning, but up out of bed appropriately interacting with staff/peers this afternoon. Went outside for group, but did not attend other groups today. Denies SI/HI/AVH. Medication compliant. Lip smacking noted this afternoon. Appetite good, blood glucose improving with insulin regimen. Pt is pleasant and cooperative with nurse, appears to be ambulating better than yesterday, steady.   Support and encouragement provided. medication administered with education. Safety maintained with every 15 minute checks. Will continue to monitor.   Principal Problem: Schizoaffective disorder ([place]) Diagnosis:   Patient Active Problem List   Diagnosis Date Noted  . Tobacco use disorder [F17.200] 07/07/2016  . Dyslipidemia [E78.5] 07/07/2016  . Asthma [J45.909] 07/07/2016  . Schizoaffective  disorder ([place]) [F25.9] 07/07/2016  . HTN (hypertension) [I10] 07/06/2016  . Diabetes ([place]) [E11.9] 12/25/2010   Total Time spent with patient: 30 minutes  Past Psychiatric History: Patient has a long history of mental illness and has had multiple hospitalizations including multiple hospitalizations in [place]. Most of  those records are unavailable to me. 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 Not sure whether he ever returns to an adequate baseline. He tells me that he has tried to cut himself and kill himself in the past. Denies any history of violence. He can't remember the names of any of his medicines. Outpatient medicines listed on admission included Abilify and Effexor.  Also hospitalized at [facility], [place].  Per review of records looks like he has been only diagnosed with major depressive disorder, he also has been diagnosed with polysubstance abuse that he has abused prescription medications.   Past Medical History: Has diabetes and is insulin-dependent. Also high blood pressure and possible COPD Past Medical History:  Diagnosis Date  . Anxiety   . Asthma   . Diabetes mellitus   . High blood pressure   . Sinus complaint    History reviewed. No pertinent surgical history.  Family History: History reviewed. No pertinent family history.  Family Psychiatric  History: unknown pt was uncooperative  Social History: Living in a group home. Has been there about 4-6 months he estimates. Says that he does not like it.  11 grade education.  Does not have a legal guardian. History  Alcohol Use No     History  Drug Use No    Social History   Social History  . Marital status: Single    Spouse name: [name]  . Number of children: [name]  . Years of education: [name]   Social History Main Topics  . Smoking status: Current Every Day Smoker    Packs/day: 0.50    Types: Cigarettes  . Smokeless tobacco: Never Used  . Alcohol use No  . Drug use: No  . Sexual activity: Not Currently   Other Topics Concern  . None   Social History Narrative  . None     Current Medications: Current Facility-Administered Medications  Medication Dose Route Frequency Provider Last Rate Last Dose  . acetaminophen (TYLENOL) tablet 650 mg  650 mg Oral Q6H  PRN Gonzella Lex, MD   650 mg at 07/06/16 2120  . alum & mag hydroxide-simeth (MAALOX/MYLANTA) 200-200-20 MG/5ML suspension 30 mL  30 mL Oral Q4H PRN Gonzella Lex, MD      . amantadine (SYMMETREL) capsule 100 mg  100 mg Oral BID Hildred Priest, MD   100 mg at 07/09/16 0814  . lisinopril (PRINIVIL,ZESTRIL) tablet 20 mg  20 mg Oral Daily Gonzella Lex, MD   20 mg at 07/09/16 3382   And  . hydrochlorothiazide (HYDRODIURIL) tablet 25 mg  25 mg Oral Daily Gonzella Lex, MD   25 mg at 07/09/16 0814  . insulin aspart (novoLOG) injection 0-15 Units  0-15 Units Subcutaneous TID WC Gonzella Lex, MD   11 Units at 07/09/16 0818  . insulin aspart (novoLOG) injection 0-5 Units  0-5 Units Subcutaneous QHS Hildred Priest, MD   5 Units at 07/07/16 2148  . insulin aspart (novoLOG) injection 15 Units  15 Units Subcutaneous TID WC Hildred Priest, MD   15 Units at 07/09/16 [phone]  . insulin glargine (LANTUS) injection 30 Units  30 Units Subcutaneous  QHS Hildred Priest, MD      . magnesium hydroxide (MILK OF MAGNESIA) suspension 30 mL  30 mL Oral Daily PRN Gonzella Lex, MD      . metFORMIN (GLUCOPHAGE) tablet 500 mg  500 mg Oral BID WC Gonzella Lex, MD   500 mg at 07/09/16 0814  . montelukast (SINGULAIR) tablet 10 mg  10 mg Oral QHS Gonzella Lex, MD   10 mg at 07/08/16 2116  . nicotine (NICODERM CQ - dosed in mg/24 hours) patch 21 mg  21 mg Transdermal Daily Gonzella Lex, MD   21 mg at 07/09/16 0818  . OLANZapine (ZYPREXA) tablet 10 mg  10 mg Oral QHS Hildred Priest, MD   10 mg at 07/08/16 2116  . simvastatin (ZOCOR) tablet 40 mg  40 mg Oral QHS Gonzella Lex, MD   40 mg at 07/08/16 2116  . [START ON 07/10/2016] venlafaxine XR (EFFEXOR-XR) 24 hr capsule 150 mg  150 mg Oral Q breakfast Hildred Priest, MD        Lab Results:  Results for orders placed or performed during the hospital encounter of 07/06/16 (from the past 48 hour(s))   Glucose, capillary     Status: Abnormal   Collection Time: 07/07/16 11:27 AM  Result Value Ref Range   Glucose-Capillary 422 (H) 65 - 99 mg/dL   Comment 1 Call MD NNP PA CNM   Glucose, capillary     Status: Abnormal   Collection Time: 07/07/16  1:04 PM  Result Value Ref Range   Glucose-Capillary 475 (H) 65 - 99 mg/dL  Glucose, capillary     Status: Abnormal   Collection Time: 07/07/16  2:24 PM  Result Value Ref Range   Glucose-Capillary 446 (H) 65 - 99 mg/dL  Glucose, capillary     Status: Abnormal   Collection Time: 07/07/16  4:26 PM  Result Value Ref Range   Glucose-Capillary 442 (H) 65 - 99 mg/dL  Glucose, capillary     Status: Abnormal   Collection Time: 07/07/16  5:09 PM  Result Value Ref Range   Glucose-Capillary 395 (H) 65 - 99 mg/dL  Basic metabolic panel     Status: Abnormal   Collection Time: 07/07/16  5:25 PM  Result Value Ref Range   Sodium 131 (L) 135 - 145 mmol/L   Potassium 3.7 3.5 - 5.1 mmol/L   Chloride 91 (L) 101 - 111 mmol/L   CO2 30 22 - 32 mmol/L   Glucose, Bld 326 (H) 65 - 99 mg/dL   BUN 16 6 - 20 mg/dL   Creatinine, Ser 1.00 0.61 - 1.24 mg/dL   Calcium 9.2 8.9 - 10.3 mg/dL   GFR calc non Af Amer >60 >60 mL/min   GFR calc Af Amer >60 >60 mL/min    Comment: (NOTE) The eGFR has been calculated using the CKD EPI equation. This calculation has not been validated in all clinical situations. eGFR's persistently <60 mL/min signify possible Chronic Kidney Disease.    Anion gap 10 5 - 15  CBC     Status: None   Collection Time: 07/07/16  5:25 PM  Result Value Ref Range   WBC 7.0 3.8 - 10.6 K/uL   RBC 5.54 4.40 - 5.90 MIL/uL   Hemoglobin 17.1 13.0 - 18.0 g/dL   HCT 49.8 40.0 - 52.0 %   MCV 89.9 80.0 - 100.0 fL   MCH 30.9 26.0 - 34.0 pg   MCHC 34.4 32.0 - 36.0 g/dL  RDW 13.2 11.5 - 14.5 %   Platelets 226 150 - 440 K/uL  Glucose, capillary     Status: Abnormal   Collection Time: 07/07/16  6:32 PM  Result Value Ref Range   Glucose-Capillary 336 (H)  65 - 99 mg/dL  Glucose, capillary     Status: Abnormal   Collection Time: 07/07/16  8:27 PM  Result Value Ref Range   Glucose-Capillary 240 (H) 65 - 99 mg/dL  Glucose, capillary     Status: Abnormal   Collection Time: 07/08/16  6:48 AM  Result Value Ref Range   Glucose-Capillary 296 (H) 65 - 99 mg/dL  Glucose, capillary     Status: Abnormal   Collection Time: 07/08/16 11:20 AM  Result Value Ref Range   Glucose-Capillary 135 (H) 65 - 99 mg/dL  Glucose, capillary     Status: Abnormal   Collection Time: 07/08/16  3:08 PM  Result Value Ref Range   Glucose-Capillary 177 (H) 65 - 99 mg/dL  Glucose, capillary     Status: Abnormal   Collection Time: 07/08/16  4:38 PM  Result Value Ref Range   Glucose-Capillary 284 (H) 65 - 99 mg/dL   Comment 1 Notify RN   Glucose, capillary     Status: None   Collection Time: 07/08/16  9:07 PM  Result Value Ref Range   Glucose-Capillary 89 65 - 99 mg/dL  Glucose, capillary     Status: Abnormal   Collection Time: 07/09/16  6:51 AM  Result Value Ref Range   Glucose-Capillary 340 (H) 65 - 99 mg/dL    Blood Alcohol level:  Lab Results  Component Value Date   ETH <5 01/75/1025    Metabolic Disorder Labs: Lab Results  Component Value Date   HGBA1C 12.8 (H) 07/07/2016   MPG 321 07/07/2016   MPG 240 02/09/2016   Lab Results  Component Value Date   PROLACTIN 3.4 (L) 07/07/2016   PROLACTIN 3.0 (L) 02/08/2016   Lab Results  Component Value Date   CHOL 120 07/07/2016   TRIG 153 (H) 07/07/2016   HDL 33 (L) 07/07/2016   CHOLHDL 3.6 07/07/2016   VLDL 31 07/07/2016   LDLCALC 56 07/07/2016   LDLCALC 99 02/08/2016    Physical Findings: AIMS: Facial and Oral Movements Muscles of Facial Expression: Mild Lips and Perioral Area: Moderate Jaw: Mild Tongue: Minimal,Extremity Movements Upper (arms, wrists, hands, fingers): None, normal Lower (legs, knees, ankles, toes): None, normal, Trunk Movements Neck, shoulders, hips: None, normal, Overall  Severity Severity of abnormal movements (highest score from questions above): Minimal Incapacitation due to abnormal movements: None, normal Patient's awareness of abnormal movements (rate only patient's report): No Awareness, Dental Status Current problems with teeth and/or dentures?: No Does patient usually wear dentures?: No (Patient on Cogentin)  CIWA:    COWS:     Musculoskeletal: Strength & Muscle Tone: within normal limits Gait & Station: shuffle Patient leans: [name]  Psychiatric Specialty Exam: Physical Exam  Constitutional: He is oriented to person, place, and time. He appears well-developed and well-nourished.  HENT:  Head: Normocephalic and atraumatic.  Eyes: Conjunctivae and EOM are normal.  Neck: Normal range of motion.  Respiratory: Effort normal.  Musculoskeletal: Normal range of motion.  Neurological: He is alert and oriented to person, place, and time.    Review of Systems  Constitutional: Negative.   HENT: Negative.   Eyes: Negative.   Respiratory: Negative.   Cardiovascular: Negative.   Gastrointestinal: Negative.   Genitourinary: Negative.   Skin: Negative.  Neurological: Negative.   Endo/Heme/Allergies: Negative.   Psychiatric/Behavioral: Positive for depression. Negative for hallucinations, memory loss, substance abuse and suicidal ideas. The patient is nervous/anxious. The patient does not have insomnia.     Blood pressure 99/68, pulse 79, temperature 97.7 F (36.5 C), resp. rate 18, height 6' (1.829 m), weight 74.8 kg (165 lb), SpO2 100 %.Body mass index is 22.38 kg/m.  General Appearance: Fairly Groomed  Eye Contact:  Minimal  Speech:  Slow  Volume:  Decreased  Mood:  Dysphoric  Affect:  Blunt  Thought Process:  Linear and Descriptions of Associations: Intact  Orientation:  Full (Time, Place, and Person)  Thought Content:  Hallucinations: None  Suicidal Thoughts:  No  Homicidal Thoughts:  No  Memory:  Immediate;   Poor Recent;    Poor Remote;   Poor  Judgement:  Impaired  Insight:  Lacking  Psychomotor Activity:  Decreased  Concentration:  Concentration: Poor and Attention Span: Poor  Recall:  Poor  Fund of Knowledge:  Poor  Language:  Poor  Akathisia:  No  Handed:    AIMS (if indicated):     Assets:  Agricultural consultant Housing  ADL's:  Intact  Cognition:  Impaired,  Mild  Sleep:  Number of Hours: 7     Treatment Plan Summary:  41 y/o male with depression, SI and hallucinations.  Multiple prior hospitalization and prior suicidal attempts.  Likely Schizoaffective diosorder.  Not much different since admission, answers are still very basic and sure. The patient has not been participating in programming and has been in his room most of the time. Appears quite anergic.  Per review of records patient has history of abusing opiates, Lyrica, Remeron and Flexeril. Here he has been frequently asking for Ativan or clonazepam.  Depressive symptoms: continue Effexor But we'll increase to 150 mg daily  Psychosis: She is to slowly be improving with olanzapine which is currently at 10 mg daily at bedtime  Akathisia: will d/c benztropine and use amantadine 100 mg po bid  Insomnia: Patient has been is sleeping well. There is no evidence of akathisia at this point. I will discontinue clonazepam. Patient has history of abusing prescription medications in the past.   History of abusing opiates, Flexeril, Lyrica and mirtazapine. We'll benefit from continuing outpatient substance abuse services. We'll not discharge him controlled substances.   Agitation: patient has not been agitated. Orders for Ativan had been discontinued as he appears confused   For diabetes: continue  NovoLog 15 units 3 times a day with meals. Lantus 30 units at bedtime. Patient has orders for supplemental insulin.  Also continue metformin 500 mg twice a day.   Patient is currently being followed by hospitalist  service  Hypertension: Continue lisinopril 20 mg a day and hydrochlorothiazide 25 mg a day  Dyslipidemia continue Zocor 40 mg at bedtime  Asthma continue Singulair 10 mg at bedtime  Tobacco use disorder continue nicotine patch 21 mg a day  Unsteady gait: No evidence of unsteady gait today. Continue  fall precautions  Labs: hemoglobin A1c, lipid panel, TSH all completed.  Diet carb modified and low sodium  Vital signs daily  Precautions every 15 minute checks  Hospitalization status involuntary  Disposition: Once stable the patient will be discharged back to his group home. Today I contacted the group home. They reported the patient is not problematic. He is welcome back  Follow up: continue to f/u with [facility] in [place].  Continue with peers support through  [facility] in [place].  [facility] is [name]'s phone number is [phone].  Possible discharge in the next 5-7 days.   Hildred Priest, MD 07/09/2016, 9:41 AM

## 2016-07-10 LAB — GLUCOSE, CAPILLARY
Glucose-Capillary: 312 mg/dL — ABNORMAL HIGH (ref 65–99)
Glucose-Capillary: 211 mg/dL — ABNORMAL HIGH (ref 65–99)
Glucose-Capillary: 327 mg/dL — ABNORMAL HIGH (ref 65–99)
Glucose-Capillary: 238 mg/dL — ABNORMAL HIGH (ref 65–99)

## 2016-07-10 MED ORDER — METFORMIN HCL 500 MG PO TABS
1000.0000 mg | ORAL_TABLET | Freq: Two times a day (BID) | ORAL | Status: DC
  Administered 2016-07-10 – 2016-07-17 (×14): 1000 mg via ORAL
  Filled 2016-07-10 (×14): qty 2

## 2016-07-10 NOTE — Progress Notes (Signed)
Inpatient Diabetes Program Recommendations  AACE/ADA: New Consensus Statement on Inpatient Glycemic Control (2015)  Target Ranges:  Prepandial:   less than 140 mg/dL      Peak postprandial:   less than 180 mg/dL (1-2 hours)      Critically ill patients:  140 - 180 mg/dL   Lab Results  Component Value Date   GLUCAP 312 (H) 07/10/2016   HGBA1C 12.8 (H) 07/07/2016    Review of Glycemic Control   Results for HARU, SHAFF (MRN [id number]) as of 07/10/2016 08:06  Ref. Range 07/09/2016 11:49 07/09/2016 16:19 07/09/2016 20:11 07/09/2016 21:33 07/10/2016 06:33  Glucose-Capillary Latest Ref Range: 65 - 99 mg/dL 765 (H) 465 (H) 75 035 (H) 312 (H)    Diabetes history:DM2 Outpatient Diabetes medications: Invokana 300 mg QAM, Lantus 22 untis QHS, Novolog 15 units TID with meals, Metformin 500 mg BID Current orders for Inpatient glycemic control: Lantus 30 units QHS, Novolog 0-15 units TID with meals, Novolog 0-5 units QHS, Metformin 500 mg BID, Novolog 15 units tid  A1C 12.8%.  Fasting blood sugar still high despite Lantus insulin. Consider increasing Metformin to 1000mg  bid.    RN- please be sure that insulin is given within one of of the blood sugar being checked- this will ensure the Novolog correction insulin dose is accurately dosed.   , RN, BA, MHA, CDE Diabetes Coordinator Inpatient Glycemic Control Team [phone] (Team Pager) [phone] [facility]) 07/10/2016 8:10 AM

## 2016-07-10 NOTE — Progress Notes (Signed)
[facility] MD Progress Note  07/10/2016 1:03 AM Tony Cunningham  MRN:  [id number] Subjective:  41 y/o male came to our ER on 2/11 by EMS due to pain in back of head, stated he had trouble concentrating and remembering things for past 3-4. Pt stated he had been shaking for past 3 days and had not slept in 4 days. Patient reported auditory hallucinations telling him to walk into traffic.  He later reported having command hallucinations telling him to cut himself and bleed out.  Said he had prior suicidal attempts in the past which were commanded by hallucinations.   Patient lives in a group home [phone] [facility]) and has been there since December 2017. Prior to that he was living with his mother in [place].  Patient has peers support services through [facility] in [place]. He goes to [facility] in [place].  2/16 patient continues to be a poor historian. His thought processes slow. He only provides very vague and short answers. He seems less distressed than at admission, his affect appears brighter. He he has been coming out of his room more but but is still needs encouragement to get out of participating in groups. This morning I encouraged him to participate in recreational therapy groups actually did quite well there and was very happy coloring. The patient seems to be slowly improving and responding to the medications. He has been denying suicidality and hallucinations.  Per nursing: Isolates in room except for medications and snack. Blood sugar 139. 30 units Lantus given as ordered. Appears dishelved and unkempt. Lip smacking noted this evening. .pt requesting klonopin. Pt informed klonopin discontinued. No behavior issues noted. No voiced thoughts of hurting himself. Denies AV/H. q 15 min checks maintained for safety. No c/o pain/discomfort noted.   Principal Problem: Schizoaffective disorder (HCC) Diagnosis:   Patient Active Problem List    Diagnosis Date Noted  . Tobacco use disorder [F17.200] 07/07/2016  . Dyslipidemia [E78.5] 07/07/2016  . Asthma [J45.909] 07/07/2016  . Schizoaffective disorder (HCC) [F25.9] 07/07/2016  . HTN (hypertension) [I10] 07/06/2016  . Diabetes (HCC) [E11.9] 12/25/2010   Total Time spent with patient: 30 minutes  Past Psychiatric History: Patient has a long history of mental illness and has had multiple hospitalizations including multiple hospitalizations in [place]. Most of those records are unavailable to me. 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 Not sure whether he ever returns to an adequate baseline. He tells me that he has tried to cut himself and kill himself in the past. Denies any history of violence. He can't remember the names of any of his medicines. Outpatient medicines listed on admission included Abilify and Effexor.  Also hospitalized at [facility], [place].  Per review of records looks like he has been only diagnosed with major depressive disorder, he also has been diagnosed with polysubstance abuse that he has abused prescription medications.   Past Medical History: Has diabetes and is insulin-dependent. Also high blood pressure and possible COPD Past Medical History:  Diagnosis Date  . Anxiety   . Asthma   . Diabetes mellitus   . High blood pressure   . Sinus complaint    History reviewed. No pertinent surgical history.  Family History: History reviewed. No pertinent family history.  Family Psychiatric  History: unknown pt was uncooperative  Social History: Living in a group home. Has been there about 4-6 months he estimates. Says that he  does not like it.  11 grade education.  Does not have a legal guardian. History  Alcohol Use No     History  Drug Use No    Social History   Social History  . Marital status: Single    Spouse name: [name]  . Number of children: [name]  . Years of education: [name]    Social History Main Topics  . Smoking status: Current Every Day Smoker    Packs/day: 0.50    Types: Cigarettes  . Smokeless tobacco: Never Used  . Alcohol use No  . Drug use: No  . Sexual activity: Not Currently   Other Topics Concern  . None   Social History Narrative  . None     Current Medications: Current Facility-Administered Medications  Medication Dose Route Frequency Provider Last Rate Last Dose  . acetaminophen (TYLENOL) tablet 650 mg  650 mg Oral Q6H PRN [name], MD   650 mg at 07/06/16 2120  . alum & mag hydroxide-simeth (MAALOX/MYLANTA) 200-200-20 MG/5ML suspension 30 mL  30 mL Oral Q4H PRN [name], MD      . amantadine (SYMMETREL) capsule 100 mg  100 mg Oral BID [name], MD   100 mg at 07/09/16 1614  . lisinopril (PRINIVIL,ZESTRIL) tablet 20 mg  20 mg Oral Daily [name], MD   20 mg at 07/09/16 9528   And  . hydrochlorothiazide (HYDRODIURIL) tablet 25 mg  25 mg Oral Daily [name], MD   25 mg at 07/09/16 0814  . insulin aspart (novoLOG) injection 0-15 Units  0-15 Units Subcutaneous TID WC [name], MD   3 Units at 07/09/16 1628  . insulin aspart (novoLOG) injection 0-5 Units  0-5 Units Subcutaneous QHS [name], MD   5 Units at 07/07/16 2148  . insulin aspart (novoLOG) injection 15 Units  15 Units Subcutaneous TID WC [name], MD   15 Units at 07/09/16 1627  . insulin glargine (LANTUS) injection 30 Units  30 Units Subcutaneous QHS [name], MD   30 Units at 07/09/16 2137  . magnesium hydroxide (MILK OF MAGNESIA) suspension 30 mL  30 mL Oral Daily PRN [name], MD      . metFORMIN (GLUCOPHAGE) tablet 500 mg  500 mg Oral BID WC [name], MD   500 mg at 07/09/16 1614  . montelukast (SINGULAIR) tablet 10 mg  10 mg Oral QHS [name], MD   10 mg at 07/09/16 2138  . nicotine (NICODERM CQ - dosed in mg/24 hours) patch 21 mg  21 mg Transdermal Daily [name], MD   21 mg at 07/09/16 0818  . OLANZapine (ZYPREXA) tablet 10 mg  10 mg Oral QHS [name], MD   10 mg at 07/09/16 2138  . simvastatin (ZOCOR) tablet 40 mg  40 mg Oral QHS [name], MD   40 mg at 07/09/16 2138  . venlafaxine XR (EFFEXOR-XR) 24 hr capsule 150 mg  150 mg Oral Q breakfast [name], MD        Lab Results:  Results for orders placed or performed during the hospital encounter of 07/06/16 (from the past 48 hour(s))  Glucose, capillary     Status: Abnormal   Collection Time: 07/08/16  6:48 AM  Result Value Ref Range   Glucose-Capillary 296 (H) 65 - 99 mg/dL  Glucose, capillary     Status: Abnormal   Collection Time: 07/08/16 11:20 AM  Result  Value Ref Range   Glucose-Capillary 135 (H) 65 - 99 mg/dL  Glucose, capillary     Status: Abnormal   Collection Time: 07/08/16  3:08 PM  Result Value Ref Range   Glucose-Capillary 177 (H) 65 - 99 mg/dL  Glucose, capillary     Status: Abnormal   Collection Time: 07/08/16  4:38 PM  Result Value Ref Range   Glucose-Capillary 284 (H) 65 - 99 mg/dL   Comment 1 Notify RN   Glucose, capillary     Status: None   Collection Time: 07/08/16  9:07 PM  Result Value Ref Range   Glucose-Capillary 89 65 - 99 mg/dL  Glucose, capillary     Status: Abnormal   Collection Time: 07/09/16  6:51 AM  Result Value Ref Range   Glucose-Capillary 340 (H) 65 - 99 mg/dL  Glucose, capillary     Status: Abnormal   Collection Time: 07/09/16 11:49 AM  Result Value Ref Range   Glucose-Capillary 370 (H) 65 - 99 mg/dL  Glucose, capillary     Status: Abnormal   Collection Time: 07/09/16  4:19 PM  Result Value Ref Range   Glucose-Capillary 177 (H) 65 - 99 mg/dL  Glucose, capillary     Status: None   Collection Time: 07/09/16  8:11 PM  Result Value Ref Range   Glucose-Capillary 75 65 - 99 mg/dL   Comment 1 Notify RN    Comment 2 Document in Chart   Glucose, capillary     Status: Abnormal   Collection Time: 07/09/16   9:33 PM  Result Value Ref Range   Glucose-Capillary 139 (H) 65 - 99 mg/dL    Blood Alcohol level:  Lab Results  Component Value Date   ETH <5 07/05/2016    Metabolic Disorder Labs: Lab Results  Component Value Date   HGBA1C 12.8 (H) 07/07/2016   MPG 321 07/07/2016   MPG 240 02/09/2016   Lab Results  Component Value Date   PROLACTIN 3.4 (L) 07/07/2016   PROLACTIN 3.0 (L) 02/08/2016   Lab Results  Component Value Date   CHOL 120 07/07/2016   TRIG 153 (H) 07/07/2016   HDL 33 (L) 07/07/2016   CHOLHDL 3.6 07/07/2016   VLDL 31 07/07/2016   LDLCALC 56 07/07/2016   LDLCALC 99 02/08/2016    Physical Findings: AIMS: Facial and Oral Movements Muscles of Facial Expression: Mild Lips and Perioral Area: Moderate Jaw: Mild Tongue: Minimal,Extremity Movements Upper (arms, wrists, hands, fingers): None, normal Lower (legs, knees, ankles, toes): None, normal, Trunk Movements Neck, shoulders, hips: None, normal, Overall Severity Severity of abnormal movements (highest score from questions above): Minimal Incapacitation due to abnormal movements: None, normal Patient's awareness of abnormal movements (rate only patient's report): No Awareness, Dental Status Current problems with teeth and/or dentures?: No Does patient usually wear dentures?: No (Patient on Cogentin)  CIWA:    COWS:     Musculoskeletal: Strength & Muscle Tone: within normal limits Gait & Station: shuffle Patient leans: [name]  Psychiatric Specialty Exam: Physical Exam  Constitutional: He is oriented to person, place, and time. He appears well-developed and well-nourished.  HENT:  Head: Normocephalic and atraumatic.  Eyes: Conjunctivae and EOM are normal.  Neck: Normal range of motion.  Respiratory: Effort normal.  Musculoskeletal: Normal range of motion.  Neurological: He is alert and oriented to person, place, and time.    Review of Systems  Constitutional: Negative.   HENT: Negative.   Eyes: Negative.    Respiratory: Negative.   Cardiovascular: Negative.  Gastrointestinal: Negative.   Genitourinary: Negative.   Skin: Negative.   Neurological: Negative.   Endo/Heme/Allergies: Negative.   Psychiatric/Behavioral: Positive for depression. Negative for hallucinations, memory loss, substance abuse and suicidal ideas. The patient is nervous/anxious. The patient does not have insomnia.     Blood pressure 99/68, pulse 79, temperature 97.7 F (36.5 C), resp. rate 18, height 6' (1.829 m), weight 74.8 kg (165 lb), SpO2 100 %.Body mass index is 22.38 kg/m.  General Appearance: Fairly Groomed  Eye Contact:  Minimal  Speech:  Slow  Volume:  Decreased  Mood:  Dysphoric  Affect:  Blunt  Thought Process:  Linear and Descriptions of Associations: Intact  Orientation:  Full (Time, Place, and Person)  Thought Content:  Hallucinations: None  Suicidal Thoughts:  No  Homicidal Thoughts:  No  Memory:  Immediate;   Poor Recent;   Poor Remote;   Poor  Judgement:  Impaired  Insight:  Lacking  Psychomotor Activity:  Decreased  Concentration:  Concentration: Poor and Attention Span: Poor  Recall:  Poor  Fund of Knowledge:  Poor  Language:  Poor  Akathisia:  No  Handed:    AIMS (if indicated):     Assets:  Architect Housing  ADL's:  Intact  Cognition:  Impaired,  Mild  Sleep:  Number of Hours: 7     Treatment Plan Summary:  41 y/o male with depression, SI and hallucinations.  Multiple prior hospitalization and prior suicidal attempts.  Likely Schizoaffective diosorder.  Not much different since admission, answers are still very basic and sure. The patient has not been participating in programming and has been in his room most of the time. Appears quite anergic.  Per review of records patient has history of abusing opiates, Lyrica, Remeron and Flexeril. Here he has been frequently asking for Ativan or clonazepam.  Depressive symptoms: continue Effexor  which has been increased to 150 mg po q am  Psychosis: he is to slowly be improving with olanzapine which is currently at 10 mg daily at bedtime.  Pt was taking abilify at admission that was likely causing akathisia  Akathisia: continue amantadine 100 mg po bid  Insomnia: sleeping well at night  History of abusing opiates, Flexeril, Lyrica and mirtazapine. We'll benefit from continuing outpatient substance abuse services. We'll not discharge him with controlled substances.  Keeps asking nurses for klonopin   For diabetes: continue  NovoLog 15 units 3 times a day with meals. Lantus 30 units at bedtime. Patient has orders for supplemental insulin.  Also continue metformin 500 mg twice a day.   Patient is currently being followed by hospitalist service  Hypertension: Continue lisinopril 20 mg a day and hydrochlorothiazide 25 mg a day  Dyslipidemia continue Zocor 40 mg at bedtime  Asthma continue Singulair 10 mg at bedtime  Tobacco use disorder continue nicotine patch 21 mg a day  Unsteady gait: No evidence of unsteady gait today. Continue  fall precautions  Labs: hemoglobin A1c, lipid panel, TSH all completed.  Diet carb modified and low sodium  Vital signs daily  Precautions every 15 minute checks  Hospitalization status now voluntary  Disposition: Once stable the patient will be discharged back to his group home.   Follow up: continue to f/u with [facility] in [place].  Continue with peers support through [facility] in [place].  Pe[facility] support is [name]'s phone number is [phone]. Also attending day program.  Possible discharge in the next 3- 5 days. Hopefully we can discharge  back to the group home early next week.   [name], MD 07/10/2016, 1:03 AM

## 2016-07-10 NOTE — [facility] Group Notes (Signed)
[facility] LCSW Group Therapy Note  Date/Time: 07/10/16, 1300  Type of Therapy and Topic:  Group Therapy:  Feelings around Relapse and Recovery  Participation Level:  Active   Mood: pleasant  Description of Group:    Patients in this group will discuss emotions they experience before and after a relapse. They will process how experiencing these feelings, or avoidance of experiencing them, relates to having a relapse. Facilitator will guide patients to explore emotions they have related to recovery. Patients will be encouraged to process which emotions are more powerful. They will be guided to discuss the emotional reaction significant others in their lives may have to patients' relapse or recovery. Patients will be assisted in exploring ways to respond to the emotions of others without this contributing to a relapse.  Therapeutic Goals: 1. Patient will identify two or more emotions that lead to relapse for them:  2. Patient will identify two emotions that result when they relapse:  3. Patient will identify two emotions related to recovery:  4. Patient will demonstrate ability to communicate their needs through discussion and/or role plays.   Summary of Patient Progress: Pt was able to identify sadness as an emotion that he struggles to cope with.  Pt made several contributions to the group discussion regarding relapse prevention planning and triggers.     Therapeutic Modalities:   Cognitive Behavioral Therapy Solution-Focused Therapy Assertiveness Training Relapse Prevention Therapy  Daleen Squibb, LCSW

## 2016-07-10 NOTE — Plan of Care (Signed)
Problem: [facility] Participation in Recreation Therapeutic Interventions Goal: STG-Patient will demonstrate improved self esteem by identif STG: Self-Esteem - Within 4 treatment sessions, patient will verbalize at least 5 positive affirmation statements in each of 2 treatment sessions to increase self-esteem.  Outcome: Progressing Treatment Session 1; Completed 1 out of 2: At approximately 2:45 pm, LRT met with patient in consultation room. Patient verbalized 5 positive affirmation statements. Patient reported it felt "very good". LRT encouraged patient to continue saying positive affirmation statements.  Leonette Monarch, LRT/CTRS 02.16.18 3:29 pm Goal: STG-Other Recreation Therapy Goal (Specify) STG: Stress Management - Within 4 treatment sessions, patient will verbalize understanding of the stress management techniques in each of 2 treatment sessions to increase stress management skills.  Outcome: Progressing Treatment Session 1; Completed 1 out of 2: At approximately 2:45 pm, LRT met with patient in consultation room. LRT educated and provided patient with handouts on stress management techniques. Patient verbalized understanding. LRT encouraged patient to read over and practice the stress management techniques.  Leonette Monarch, LRT/CTRS 02.16.18 3:30 pm

## 2016-07-10 NOTE — Progress Notes (Signed)
Isolates in room except for medications and snack. Blood sugar 139. 30 units Lantus given as ordered. Appears dishelved and unkempt. Lip smacking noted this evening. .pt requesting klonopin. Pt informed klonopin discontinued. No behavior issues noted. No voiced thoughts of hurting himself. Denies AV/H. q 15 min checks maintained for safety. No c/o pain/discomfort noted.

## 2016-07-10 NOTE — Progress Notes (Signed)
Recreation Therapy Notes  Date: 02.16.18 Time: 9:30 am Location: Craft Room  Group Topic: Coping Skills  Goal Area(s) Addresses:  Patient will participate in healthy coping skill. Patient will verbalize benefit of using art as a coping skill.  Behavioral Response: Attentive, Interactive  Intervention: Coloring  Activity: Patients were given coloring sheets to color and were instructed to think about the emotions they were feeling as well as what their minds were focused on.  Education: LRT educated patients on healthy coping skills.  Education Outcome: Acknowledges education/In group clarification offered   Clinical Observations/Feedback: Patient colored coloring sheet. Patient contributed to group discussion by stating what emotions he was feeling and what his mind was focused on.  Jacquelynn Cree, LRT/CTRS 07/10/2016 10:27 AM

## 2016-07-10 NOTE — Progress Notes (Signed)
Calm and cooperative. Blood sugar 312 at 0630. No s/s of hyper/hypoglycemia. Will continue to  Monitor.

## 2016-07-11 DIAGNOSIS — F259 Schizoaffective disorder, unspecified: Secondary | ICD-10-CM

## 2016-07-11 LAB — GLUCOSE, CAPILLARY
GLUCOSE-CAPILLARY: 179 mg/dL — AB (ref 65–99)
GLUCOSE-CAPILLARY: 137 mg/dL — AB (ref 65–99)
Glucose-Capillary: 134 mg/dL — ABNORMAL HIGH (ref 65–99)
Glucose-Capillary: 155 mg/dL — ABNORMAL HIGH (ref 65–99)

## 2016-07-11 MED ORDER — TRAZODONE HCL 50 MG PO TABS
50.0000 mg | ORAL_TABLET | Freq: Every evening | ORAL | Status: DC | PRN
Start: 2016-07-11 — End: 2016-07-14
  Administered 2016-07-11: 50 mg via ORAL
  Filled 2016-07-11: qty 1

## 2016-07-11 NOTE — [facility] Group Notes (Signed)
[facility] LCSW Group Therapy  07/11/2016 3:51 PM  Type of Therapy:  Group Therapy  Participation Level:  Patient did not attend group. CSW invited patient to group.   Summary of Progress/Problems: Self-responsibility/accountability- Patients discussed self responsibility/accountability  and how it impacts them. Patients were asked to define these concepts in their own words. They discussed taking ownership of their actions and the challenges they have with taking accountability for their self. CSW introduced "YOU vs. I" statements and explained how "I" statements identifies how they feel about the situation without being threatening or offensive to others and could help improve their communication with others. Examples were provided of each. They were challenged to identify changes that are needed in order to improve self responsibility/accountability. CSW provided inspirational quotes that focused on the patients taking accountability for actions both good and bad. Patients were asked to read the quotes out loud and share their thoughts with the group about their quote.  Theadore Blunck G. Garnette Czech MSW, LCSWA 07/11/2016, 3:52 PM

## 2016-07-11 NOTE — Progress Notes (Signed)
[facility] MD Progress Note  07/11/2016 3:22 PM Tony Cunningham  MRN:  [id number] Subjective:   41 y/o male with depression, SI and hallucinations. Multiple prior hospitalization and prior suicidal attempts,  Schizoaffective disorder. Pt still endorsing depression,  but denies SI. Pt has blunted affect, reports mood as " so so" , poor sleep, denies AH.  Principal Problem: Schizoaffective disorder (HCC) Diagnosis:   Patient Active Problem List   Diagnosis Date Noted  . Tobacco use disorder [F17.200] 07/07/2016  . Dyslipidemia [E78.5] 07/07/2016  . Asthma [J45.909] 07/07/2016  . Schizoaffective disorder (HCC) [F25.9] 07/07/2016  . HTN (hypertension) [I10] 07/06/2016  . Diabetes (HCC) [E11.9] 12/25/2010   Total Time spent with patient: 30 minutes  Past Psychiatric History:   Past Medical History:  Past Medical History:  Diagnosis Date  . Anxiety   . Asthma   . Diabetes mellitus   . High blood pressure   . Sinus complaint    History reviewed. No pertinent surgical history. Family History: History reviewed. No pertinent family history. Family Psychiatric  History:  Social History:  History  Alcohol Use No     History  Drug Use No    Social History   Social History  . Marital status: Single    Spouse name: [name]  . Number of children: [name]  . Years of education: [name]   Social History Main Topics  . Smoking status: Current Every Day Smoker    Packs/day: 0.50    Types: Cigarettes  . Smokeless tobacco: Never Used  . Alcohol use No  . Drug use: No  . Sexual activity: Not Currently   Other Topics Concern  . None   Social History Narrative  . None   Additional Social History:                         Sleep: Poor  Appetite:  Fair  Current Medications: Current Facility-Administered Medications  Medication Dose Route Frequency Provider Last Rate Last Dose  . acetaminophen (TYLENOL) tablet 650 mg  650 mg Oral Q6H PRN [name], MD   650 mg at 07/06/16 2120  .  alum & mag hydroxide-simeth (MAALOX/MYLANTA) 200-200-20 MG/5ML suspension 30 mL  30 mL Oral Q4H PRN [name], MD      . amantadine (SYMMETREL) capsule 100 mg  100 mg Oral BID [name], MD   100 mg at 07/11/16 0846  . lisinopril (PRINIVIL,ZESTRIL) tablet 20 mg  20 mg Oral Daily [name], MD   20 mg at 07/11/16 0846   And  . hydrochlorothiazide (HYDRODIURIL) tablet 25 mg  25 mg Oral Daily [name], MD   25 mg at 07/11/16 0846  . insulin aspart (novoLOG) injection 0-15 Units  0-15 Units Subcutaneous TID WC [name], MD   2 Units at 07/11/16 1139  . insulin aspart (novoLOG) injection 0-5 Units  0-5 Units Subcutaneous QHS [name], MD   2 Units at 07/10/16 2142  . insulin aspart (novoLOG) injection 15 Units  15 Units Subcutaneous TID WC [name], MD   15 Units at 07/11/16 1141  . insulin glargine (LANTUS) injection 30 Units  30 Units Subcutaneous QHS [name], MD   30 Units at 07/10/16 2143  . magnesium hydroxide (MILK OF MAGNESIA) suspension 30 mL  30 mL Oral Daily PRN [name], MD      . metFORMIN (GLUCOPHAGE) tablet 1,000 mg  1,000 mg Oral BID WC [name],  MD   1,000 mg at 07/11/16 0846  . montelukast (SINGULAIR) tablet 10 mg  10 mg Oral QHS [name], MD   10 mg at 07/10/16 2136  . nicotine (NICODERM CQ - dosed in mg/24 hours) patch 21 mg  21 mg Transdermal Daily [name], MD   21 mg at 07/11/16 0846  . OLANZapine (ZYPREXA) tablet 10 mg  10 mg Oral QHS [name], MD   10 mg at 07/10/16 2136  . simvastatin (ZOCOR) tablet 40 mg  40 mg Oral QHS [name], MD   40 mg at 07/10/16 2137  . venlafaxine XR (EFFEXOR-XR) 24 hr capsule 150 mg  150 mg Oral Q breakfast [name], MD   150 mg at 07/11/16 4132    Lab Results:  Results for orders placed or performed during the hospital encounter of 07/06/16 (from the past 48 hour(s))  Glucose, capillary      Status: Abnormal   Collection Time: 07/09/16  4:19 PM  Result Value Ref Range   Glucose-Capillary 177 (H) 65 - 99 mg/dL  Glucose, capillary     Status: None   Collection Time: 07/09/16  8:11 PM  Result Value Ref Range   Glucose-Capillary 75 65 - 99 mg/dL   Comment 1 Notify RN    Comment 2 Document in Chart   Glucose, capillary     Status: Abnormal   Collection Time: 07/09/16  9:33 PM  Result Value Ref Range   Glucose-Capillary 139 (H) 65 - 99 mg/dL  Glucose, capillary     Status: Abnormal   Collection Time: 07/10/16  6:33 AM  Result Value Ref Range   Glucose-Capillary 312 (H) 65 - 99 mg/dL   Comment 1 Notify RN    Comment 2 Document in Chart   Glucose, capillary     Status: Abnormal   Collection Time: 07/10/16 11:21 AM  Result Value Ref Range   Glucose-Capillary 211 (H) 65 - 99 mg/dL   Comment 1 Notify RN   Glucose, capillary     Status: Abnormal   Collection Time: 07/10/16  4:05 PM  Result Value Ref Range   Glucose-Capillary 327 (H) 65 - 99 mg/dL   Comment 1 Notify RN   Glucose, capillary     Status: Abnormal   Collection Time: 07/10/16  9:40 PM  Result Value Ref Range   Glucose-Capillary 238 (H) 65 - 99 mg/dL  Glucose, capillary     Status: Abnormal   Collection Time: 07/11/16  6:13 AM  Result Value Ref Range   Glucose-Capillary 179 (H) 65 - 99 mg/dL  Glucose, capillary     Status: Abnormal   Collection Time: 07/11/16 11:21 AM  Result Value Ref Range   Glucose-Capillary 137 (H) 65 - 99 mg/dL    Blood Alcohol level:  Lab Results  Component Value Date   ETH <5 07/05/2016    Metabolic Disorder Labs: Lab Results  Component Value Date   HGBA1C 12.8 (H) 07/07/2016   MPG 321 07/07/2016   MPG 240 02/09/2016   Lab Results  Component Value Date   PROLACTIN 3.4 (L) 07/07/2016   PROLACTIN 3.0 (L) 02/08/2016   Lab Results  Component Value Date   CHOL 120 07/07/2016   TRIG 153 (H) 07/07/2016   HDL 33 (L) 07/07/2016   CHOLHDL 3.6 07/07/2016   VLDL 31  07/07/2016   LDLCALC 56 07/07/2016   LDLCALC 99 02/08/2016    Physical Findings: AIMS: Facial and Oral Movements Muscles of Facial  Expression: Mild Lips and Perioral Area: Moderate Jaw: Mild Tongue: Minimal,Extremity Movements Upper (arms, wrists, hands, fingers): None, normal Lower (legs, knees, ankles, toes): None, normal, Trunk Movements Neck, shoulders, hips: None, normal, Overall Severity Severity of abnormal movements (highest score from questions above): Minimal Incapacitation due to abnormal movements: None, normal Patient's awareness of abnormal movements (rate only patient's report): No Awareness, Dental Status Current problems with teeth and/or dentures?: No Does patient usually wear dentures?: No (Patient on Cogentin)  CIWA:    COWS:     Musculoskeletal: Strength & Muscle Tone: within normal limits Gait & Station: shuffle Patient leans: [name]  Psychiatric Specialty Exam: Physical Exam  Nursing note and vitals reviewed.   ROS  Blood pressure 101/70, pulse 63, temperature 98.1 F (36.7 C), resp. rate 16, height 6' (1.829 m), weight 74.8 kg (165 lb), SpO2 99 %.Body mass index is 22.38 kg/m.  General Appearance: Fairly Groomed  Eye Contact:  Minimal  Speech:  Slow  Volume:  Decreased  Mood:  Dysphoric  Affect:  Blunt  Thought Process:  Linear and Descriptions of Associations: Intact  Orientation:  Full (Time, Place, and Person)  Thought Content:  Hallucinations: None  Suicidal Thoughts:  No  Homicidal Thoughts:  No  Memory:  Immediate;   Poor Recent;   Poor Remote;   Poor  Judgement:  Impaired  Insight:  Lacking  Psychomotor Activity:  Decreased  Concentration:  Concentration: Poor and Attention Span: Poor  Recall:  Poor  Fund of Knowledge:  Poor  Language:  Poor  Akathisia:  No  Handed:    AIMS (if indicated):     Assets:  Architect Housing  ADL's:  Intact  Cognition:  Impaired,  Mild  Sleep:  Number of  Hours: 7.3                                                          Treatment Plan Summary: Daily contact with patient to assess and evaluate symptoms and progress in treatment and Medication management 41 y/o male with depression, SI and hallucinations. Multiple prior hospitalization and prior suicidal attempts. Likely Schizoaffective diosorder.   The patient has not been participating in programming and has been in his room most of the time. Appears quite anergic.  Patient has history of abusing opiates, Lyrica, Remeron and Flexeril.  Depressive symptoms: continue Effexor which has been increased to 150 mg po q am  Psychosis: he is to slowly be improving with olanzapine which is currently at 10 mg daily at bedtime.  Pt was taking abilify at admission that was likely causing akathisia  Akathisia: continue amantadine 100 mg po bid  Insomnia: trazodone for sleep  History of abusing opiates, Flexeril, Lyrica and mirtazapine. We'll benefit from continuing outpatient substance abuse services. We'll not discharge him with controlled substances.    For diabetes: continue  NovoLog 15units 3 times a day with meals. Lantus 30 units at bedtime. Patient has orders for supplemental insulin. Also continue metformin 500 mg twice a day.  Patient is currently being followed by hospitalist service  Hypertension: Continue lisinopril 20 mg a day and hydrochlorothiazide 25 mg a day  Dyslipidemia continue Zocor 40 mg at bedtime  Asthma continue Singulair 10 mg at bedtime  Tobacco use disorder continue nicotine patch 21 mg a day  Unsteady gait: No evidence of unsteady gait today. Continue  fall precautions  Labs: hemoglobin A1c, lipid panel, TSH all completed.  Diet carb modified and low sodium  Vital signs daily  Precautions every 15 minute checks  Hospitalization status now voluntary  Disposition: Once stable the patient will be discharged back  to his group home.   Follow up: continue to f/u with [facility] in [place].  Continue with peers support through [facility] in [place].  Peers support is [name]'s phone number is [phone]. Also attending day program.  Possible discharge in the next 3- 5 days  Beverly Sessions, MD 07/11/2016, 3:22 PM

## 2016-07-11 NOTE — Tx Team (Signed)
Interdisciplinary Treatment and Diagnostic Plan Update  07/11/2016 (Late Entry from 07/10/2016) Time of Session: 10:30am Tony Cunningham MRN: [id number]  Principal Diagnosis: Schizoaffective disorder [facility])  Secondary Diagnoses: Principal Problem:   Schizoaffective disorder (HCC) Active Problems:   Diabetes (HCC)   HTN (hypertension)   Tobacco use disorder   Dyslipidemia   Asthma   Current Medications:  Current Facility-Administered Medications  Medication Dose Route Frequency Provider Last Rate Last Dose  . acetaminophen (TYLENOL) tablet 650 mg  650 mg Oral Q6H PRN [name], MD   650 mg at 07/06/16 2120  . alum & mag hydroxide-simeth (MAALOX/MYLANTA) 200-200-20 MG/5ML suspension 30 mL  30 mL Oral Q4H PRN [name], MD      . amantadine (SYMMETREL) capsule 100 mg  100 mg Oral BID [name], MD   100 mg at 07/11/16 0846  . lisinopril (PRINIVIL,ZESTRIL) tablet 20 mg  20 mg Oral Daily [name], MD   20 mg at 07/11/16 0846   And  . hydrochlorothiazide (HYDRODIURIL) tablet 25 mg  25 mg Oral Daily [name], MD   25 mg at 07/11/16 0846  . insulin aspart (novoLOG) injection 0-15 Units  0-15 Units Subcutaneous TID WC [name], MD   3 Units at 07/11/16 [phone]  . insulin aspart (novoLOG) injection 0-5 Units  0-5 Units Subcutaneous QHS [name], MD   2 Units at 07/10/16 2142  . insulin aspart (novoLOG) injection 15 Units  15 Units Subcutaneous TID WC [name], MD   15 Units at 07/11/16 0850  . insulin glargine (LANTUS) injection 30 Units  30 Units Subcutaneous QHS [name], MD   30 Units at 07/10/16 2143  . magnesium hydroxide (MILK OF MAGNESIA) suspension 30 mL  30 mL Oral Daily PRN [name], MD      . metFORMIN (GLUCOPHAGE) tablet 1,000 mg  1,000 mg Oral BID WC [name], MD   1,000 mg at 07/11/16 0846  . montelukast (SINGULAIR) tablet 10 mg  10 mg Oral QHS [name], MD   10 mg at  07/10/16 2136  . nicotine (NICODERM CQ - dosed in mg/24 hours) patch 21 mg  21 mg Transdermal Daily [name], MD   21 mg at 07/11/16 0846  . OLANZapine (ZYPREXA) tablet 10 mg  10 mg Oral QHS [name], MD   10 mg at 07/10/16 2136  . simvastatin (ZOCOR) tablet 40 mg  40 mg Oral QHS [name], MD   40 mg at 07/10/16 2137  . venlafaxine XR (EFFEXOR-XR) 24 hr capsule 150 mg  150 mg Oral Q breakfast [name], MD   150 mg at 07/11/16 5449   PTA Medications: Prescriptions Prior to Admission  Medication Sig Dispense Refill Last Dose  . ARIPiprazole (ABILIFY) 10 MG tablet Take 1 tablet (10 mg total) by mouth daily. For mood control 30 tablet 0   . benztropine (COGENTIN) 1 MG tablet Take 1 tablet (1 mg total) by mouth 2 (two) times daily. For prevention of drug induced tremors 60 tablet 0   . canagliflozin (INVOKANA) 300 MG TABS tablet Take 1 tablet (300 mg total) by mouth daily before breakfast. For diabetes management 30 tablet 0   . diclofenac sodium (VOLTAREN) 1 % GEL Apply 2 g topically 2 (two) times daily. For arthritic pain     . hydrOXYzine (ATARAX/VISTARIL) 25 MG tablet Take 1 tablet (25 mg total) by mouth 3 (three) times daily as needed for anxiety.  60 tablet 0   . insulin aspart (NOVOLOG) 100 UNIT/ML injection Inject 15 Units into the skin 3 (three) times daily with meals. For diabetes management 10 mL 0   . insulin glargine (LANTUS) 100 UNIT/ML injection Inject 0.22 mLs (22 Units total) into the skin at bedtime. For diabetes management 10 mL 0   . lisinopril-hydrochlorothiazide (PRINZIDE,ZESTORETIC) 20-25 MG tablet Take 1 tablet by mouth daily. For high blood pressure 30 tablet 0   . metFORMIN (GLUCOPHAGE) 500 MG tablet Take 1 tablet (500 mg total) by mouth 2 (two) times daily with a meal. For diabetes management 60 tablet 0   . montelukast (SINGULAIR) 10 MG tablet Take 1 tablet (10 mg total) by mouth at bedtime. For Asthma 30 tablet 0   . nicotine  (NICODERM CQ - DOSED IN MG/24 HOURS) 21 mg/24hr patch Place 1 patch (21 mg total) onto the skin daily. For smoking cessation 28 patch 0   . pregabalin (LYRICA) 75 MG capsule Take 1 capsule (75 mg total) by mouth 2 (two) times daily. For pain 16 capsule 0   . simvastatin (ZOCOR) 40 MG tablet Take 1 tablet (40 mg total) by mouth at bedtime. For high cholesterol 15 tablet 0   . traZODone (DESYREL) 100 MG tablet Take 1 tablet (100 mg total) by mouth at bedtime as needed for sleep. (Patient taking differently: Take 150 mg by mouth at bedtime as needed for sleep. ) 30 tablet 0   . venlafaxine XR (EFFEXOR-XR) 37.5 MG 24 hr capsule Take 5 capsules (187.5 mg total) by mouth daily with breakfast. For depression (Patient taking differently: Take 150 mg by mouth daily with breakfast. For depression) 150 capsule 0     Patient Stressors: Health problems Medication change or noncompliance  Patient Strengths: Capable of independent living Physical Health  Treatment Modalities: Medication Management, Group therapy, Case management,  1 to 1 session with clinician, Psychoeducation, Recreational therapy.   Physician Treatment Plan for Primary Diagnosis: Schizoaffective disorder (HCC) Long Term Goal(s): Improvement in symptoms so as ready for discharge Improvement in symptoms so as ready for discharge   Short Term Goals: Ability to identify changes in lifestyle to reduce recurrence of condition will improve Ability to verbalize feelings will improve Ability to disclose and discuss suicidal ideas Ability to demonstrate self-control will improve Ability to disclose and discuss suicidal ideas Ability to demonstrate self-control will improve Ability to identify and develop effective coping behaviors will improve  Medication Management: Evaluate patient's response, side effects, and tolerance of medication regimen.  Therapeutic Interventions: 1 to 1 sessions, Unit Group sessions and Medication  administration.  Evaluation of Outcomes: Progressing  Physician Treatment Plan for Secondary Diagnosis: Principal Problem:   Schizoaffective disorder (HCC) Active Problems:   Diabetes (HCC)   HTN (hypertension)   Tobacco use disorder   Dyslipidemia   Asthma  Long Term Goal(s): Improvement in symptoms so as ready for discharge Improvement in symptoms so as ready for discharge   Short Term Goals: Ability to identify changes in lifestyle to reduce recurrence of condition will improve Ability to verbalize feelings will improve Ability to disclose and discuss suicidal ideas Ability to demonstrate self-control will improve Ability to disclose and discuss suicidal ideas Ability to demonstrate self-control will improve Ability to identify and develop effective coping behaviors will improve     Medication Management: Evaluate patient's response, side effects, and tolerance of medication regimen.  Therapeutic Interventions: 1 to 1 sessions, Unit Group sessions and Medication administration.  Evaluation of Outcomes: Progressing  RN Treatment Plan for Primary Diagnosis: Schizoaffective disorder (HCC) Long Term Goal(s): Knowledge of disease and therapeutic regimen to maintain health will improve  Short Term Goals: Ability to verbalize frustration and anger appropriately will improve, Ability to demonstrate self-control, Ability to disclose and discuss suicidal ideas, Ability to identify and develop effective coping behaviors will improve and Compliance with prescribed medications will improve  Medication Management: RN will administer medications as ordered by provider, will assess and evaluate patient's response and provide education to patient for prescribed medication. RN will report any adverse and/or side effects to prescribing provider.  Therapeutic Interventions: 1 on 1 counseling sessions, Psychoeducation, Medication administration, Evaluate responses to treatment, Monitor vital  signs and CBGs as ordered, Perform/monitor CIWA, COWS, AIMS and Fall Risk screenings as ordered, Perform wound care treatments as ordered.  Evaluation of Outcomes: Progressing   LCSW Treatment Plan for Primary Diagnosis: Schizoaffective disorder (HCC) Long Term Goal(s): Safe transition to appropriate next level of care at discharge, Engage patient in therapeutic group addressing interpersonal concerns.  Short Term Goals: Engage patient in aftercare planning with referrals and resources, Increase social support, Increase ability to appropriately verbalize feelings and Increase skills for wellness and recovery  Therapeutic Interventions: Assess for all discharge needs, 1 to 1 time with Social worker, Explore available resources and support systems, Assess for adequacy in community support network, Educate family and significant other(s) on suicide prevention, Complete Psychosocial Assessment, Interpersonal group therapy.  Evaluation of Outcomes: Progressing   Progress in Treatment: Attending groups: Yes. Participating in groups: Yes. Taking medication as prescribed: Yes. Toleration medication: Yes. Family/Significant other contact made: Yes, individual(s) contacted:  group home owner Patient understands diagnosis: Yes. Discussing patient identified problems/goals with staff: Yes. Medical problems stabilized or resolved: Yes. Denies suicidal/homicidal ideation: Yes. Issues/concerns per patient self-inventory: No. Other: n/a  New problem(s) identified: None identified at this time.   New Short Term/Long Term Goal(s): None identified at this time.   Discharge Plan or Barriers: Patient will discharge back to group home and follow-up with [facility] in [place].   Reason for Continuation of Hospitalization: Delusions  Depression Hallucinations  Estimated Length of Stay: 7 days.   Attendees: Patient: Tony Cunningham 07/11/2016 10:04 AM  Physician: Dr. Radene JourneyJayme Cloud, MD 07/11/2016 10:04 AM  Nursing: Leonia Reader, RN 07/11/2016 10:04 AM  RN Care Manager: 07/11/2016 10:04 AM  Social Worker: Fredrich Birks. Garnette Czech MSW, LCSWA 07/11/2016 10:04 AM  Recreational Therapist: Jacquelynn Cree, LRT/CTRS 07/11/2016 10:04 AM  Other:  07/11/2016 10:04 AM  Other:  07/11/2016 10:04 AM  Other: 07/11/2016 10:04 AM    Scribe for Treatment Team: Arelia Longest, LCSWA 07/11/2016 10:07 AM

## 2016-07-11 NOTE — Progress Notes (Signed)
Affect flat.  Denies SI?  More talkative today evening joking.  Talking about when he is going to get to home.  Asked, "Do you think I am getting better"  Explained that he is and that he is more talkative and that he is smiling now. No auditory or visual hallucinations.  Support and encouragement given.  Safety maintained.

## 2016-07-12 LAB — GLUCOSE, CAPILLARY
GLUCOSE-CAPILLARY: 266 mg/dL — AB (ref 65–99)
Glucose-Capillary: 92 mg/dL (ref 65–99)
Glucose-Capillary: 290 mg/dL — ABNORMAL HIGH (ref 65–99)
Glucose-Capillary: 122 mg/dL — ABNORMAL HIGH (ref 65–99)

## 2016-07-12 MED ORDER — HYDROXYZINE HCL 50 MG PO TABS
50.0000 mg | ORAL_TABLET | Freq: Once | ORAL | Status: AC | PRN
  Administered 2016-07-12: 50 mg via ORAL
  Filled 2016-07-12: qty 1

## 2016-07-12 NOTE — [facility] Group Notes (Signed)
[facility] LCSW Group Therapy  07/12/2016 3:19 PM  Type of Therapy:  Group Therapy  Participation Level:  Minimal  Participation Quality:  Attentive  Affect:  Appropriate  Cognitive:  Alert  Insight:  Improving  Engagement in Therapy:  Improving  Modes of Intervention:  Activity, Discussion, Education, Problem-solving, Reality Testing, Socialization and Support  Summary of Progress/Problems: Recognizing Triggers: Patients defined triggers and discussed the importance of recognizing their personal warning signs. Patients identified their own triggers and how they tend to cope with stressful situations. Patients discussed areas such as people, places, things, and thoughts that rigger certain emotions for them. CSW provided support to patients and discussed safety planning for when these triggers occur. Group participants had opportunities to share openly with the group and participate in a group discussion while providing support and feedback to their peers. Patient discussed noticing physical symptoms in his body when he gets angry or upset. Patient states he starts to shake, isolation, and headaches.   Jayanna Kroeger G. Garnette Czech MSW, LCSWA 07/12/2016, 3:21 PM

## 2016-07-12 NOTE — Progress Notes (Signed)
[facility] MD Progress Note  07/12/2016 12:59 PM Tony Cunningham  MRN:  [id number] Subjective:   41 y/o male with depression, SI and hallucinations. Multiple prior hospitalization and prior suicidal attempts,  Schizoaffective disorder. Pt reports that  depression is better, but he is still isolative, guarded, responding to IS at times, ,   denies SI. Pt has blunted affect, reports mood as " ok" ,   Principal Problem: Schizoaffective disorder (HCC) Diagnosis:   Patient Active Problem List   Diagnosis Date Noted  . Tobacco use disorder [F17.200] 07/07/2016  . Dyslipidemia [E78.5] 07/07/2016  . Asthma [J45.909] 07/07/2016  . Schizoaffective disorder (HCC) [F25.9] 07/07/2016  . HTN (hypertension) [I10] 07/06/2016  . Diabetes (HCC) [E11.9] 12/25/2010   Total Time spent with patient: 30 minutes  Past Psychiatric History:   Past Medical History:  Past Medical History:  Diagnosis Date  . Anxiety   . Asthma   . Diabetes mellitus   . High blood pressure   . Sinus complaint    History reviewed. No pertinent surgical history. Family History: History reviewed. No pertinent family history. Family Psychiatric  History:  Social History:  History  Alcohol Use No     History  Drug Use No    Social History   Social History  . Marital status: Single    Spouse name: [name]  . Number of children: [name]  . Years of education: [name]   Social History Main Topics  . Smoking status: Current Every Day Smoker    Packs/day: 0.50    Types: Cigarettes  . Smokeless tobacco: Never Used  . Alcohol use No  . Drug use: No  . Sexual activity: Not Currently   Other Topics Concern  . None   Social History Narrative  . None   Additional Social History:                         Sleep: Poor  Appetite:  Fair  Current Medications: Current Facility-Administered Medications  Medication Dose Route Frequency Provider Last Rate Last Dose  . acetaminophen (TYLENOL) tablet 650 mg  650 mg Oral Q6H PRN  [name], MD   650 mg at 07/06/16 2120  . alum & mag hydroxide-simeth (MAALOX/MYLANTA) 200-200-20 MG/5ML suspension 30 mL  30 mL Oral Q4H PRN [name], MD      . amantadine (SYMMETREL) capsule 100 mg  100 mg Oral BID [name], MD   100 mg at 07/12/16 0915  . lisinopril (PRINIVIL,ZESTRIL) tablet 20 mg  20 mg Oral Daily [name], MD   20 mg at 07/12/16 0915   And  . hydrochlorothiazide (HYDRODIURIL) tablet 25 mg  25 mg Oral Daily [name], MD   25 mg at 07/12/16 0915  . insulin aspart (novoLOG) injection 0-15 Units  0-15 Units Subcutaneous TID WC [name], MD   8 Units at 07/12/16 [phone]  . insulin aspart (novoLOG) injection 0-5 Units  0-5 Units Subcutaneous QHS [name], MD   2 Units at 07/10/16 2142  . insulin aspart (novoLOG) injection 15 Units  15 Units Subcutaneous TID WC [name], MD   15 Units at 07/12/16 0859  . insulin glargine (LANTUS) injection 30 Units  30 Units Subcutaneous QHS [name], MD   30 Units at 07/11/16 2158  . magnesium hydroxide (MILK OF MAGNESIA) suspension 30 mL  30 mL Oral Daily PRN [name], MD      . metFORMIN (GLUCOPHAGE)  tablet 1,000 mg  1,000 mg Oral BID WC [name], MD   1,000 mg at 07/12/16 0915  . montelukast (SINGULAIR) tablet 10 mg  10 mg Oral QHS [name], MD   10 mg at 07/11/16 2158  . nicotine (NICODERM CQ - dosed in mg/24 hours) patch 21 mg  21 mg Transdermal Daily [name], MD   21 mg at 07/12/16 0916  . OLANZapine (ZYPREXA) tablet 10 mg  10 mg Oral QHS [name], MD   10 mg at 07/11/16 2158  . simvastatin (ZOCOR) tablet 40 mg  40 mg Oral QHS [name], MD   40 mg at 07/11/16 2158  . traZODone (DESYREL) tablet 50 mg  50 mg Oral QHS PRN Beverly Sessions, MD   50 mg at 07/11/16 2158  . venlafaxine XR (EFFEXOR-XR) 24 hr capsule 150 mg  150 mg Oral Q breakfast [name], MD   150 mg at 07/12/16  0915    Lab Results:  Results for orders placed or performed during the hospital encounter of 07/06/16 (from the past 48 hour(s))  Glucose, capillary     Status: Abnormal   Collection Time: 07/10/16  4:05 PM  Result Value Ref Range   Glucose-Capillary 327 (H) 65 - 99 mg/dL   Comment 1 Notify RN   Glucose, capillary     Status: Abnormal   Collection Time: 07/10/16  9:40 PM  Result Value Ref Range   Glucose-Capillary 238 (H) 65 - 99 mg/dL  Glucose, capillary     Status: Abnormal   Collection Time: 07/11/16  6:13 AM  Result Value Ref Range   Glucose-Capillary 179 (H) 65 - 99 mg/dL  Glucose, capillary     Status: Abnormal   Collection Time: 07/11/16 11:21 AM  Result Value Ref Range   Glucose-Capillary 137 (H) 65 - 99 mg/dL  Glucose, capillary     Status: Abnormal   Collection Time: 07/11/16  4:22 PM  Result Value Ref Range   Glucose-Capillary 134 (H) 65 - 99 mg/dL  Glucose, capillary     Status: Abnormal   Collection Time: 07/11/16  8:17 PM  Result Value Ref Range   Glucose-Capillary 155 (H) 65 - 99 mg/dL  Glucose, capillary     Status: Abnormal   Collection Time: 07/12/16  6:27 AM  Result Value Ref Range   Glucose-Capillary 266 (H) 65 - 99 mg/dL  Glucose, capillary     Status: None   Collection Time: 07/12/16 11:47 AM  Result Value Ref Range   Glucose-Capillary 92 65 - 99 mg/dL    Blood Alcohol level:  Lab Results  Component Value Date   ETH <5 07/05/2016    Metabolic Disorder Labs: Lab Results  Component Value Date   HGBA1C 12.8 (H) 07/07/2016   MPG 321 07/07/2016   MPG 240 02/09/2016   Lab Results  Component Value Date   PROLACTIN 3.4 (L) 07/07/2016   PROLACTIN 3.0 (L) 02/08/2016   Lab Results  Component Value Date   CHOL 120 07/07/2016   TRIG 153 (H) 07/07/2016   HDL 33 (L) 07/07/2016   CHOLHDL 3.6 07/07/2016   VLDL 31 07/07/2016   LDLCALC 56 07/07/2016   LDLCALC 99 02/08/2016    Physical Findings: AIMS: Facial and Oral Movements Muscles of  Facial Expression: Mild Lips and Perioral Area: Moderate Jaw: Mild Tongue: Minimal,Extremity Movements Upper (arms, wrists, hands, fingers): None, normal Lower (legs, knees, ankles, toes): None, normal, Trunk Movements Neck, shoulders, hips: None, normal, Overall  Severity Severity of abnormal movements (highest score from questions above): Minimal Incapacitation due to abnormal movements: None, normal Patient's awareness of abnormal movements (rate only patient's report): No Awareness, Dental Status Current problems with teeth and/or dentures?: No Does patient usually wear dentures?: No (Patient on Cogentin)  CIWA:    COWS:     Musculoskeletal: Strength & Muscle Tone: within normal limits Gait & Station: shuffle Patient leans: [name]  Psychiatric Specialty Exam: Physical Exam  Nursing note and vitals reviewed.   Review of Systems  Psychiatric/Behavioral: Positive for hallucinations.    Blood pressure 104/71, pulse 61, temperature 98.1 F (36.7 C), temperature source Oral, resp. rate 16, height 6' (1.829 m), weight 74.8 kg (165 lb), SpO2 99 %.Body mass index is 22.38 kg/m.  General Appearance: Fairly Groomed  Eye Contact:  Minimal  Speech:  Slow  Volume:  Decreased  Mood:  Dysphoric  Affect:  Blunted  Thought Process:  Linear and Descriptions of Associations: Intact  Orientation:  Full (Time, Place, and Person)  Thought Content: responding to IS at times, guarded  Suicidal Thoughts:  No  Homicidal Thoughts:  No  Memory:  Immediate;   Poor Recent;   Poor Remote;   Poor  Judgement:  Impaired  Insight:  Lacking  Psychomotor Activity:  Decreased  Concentration:  Concentration: Poor and Attention Span: Poor  Recall:  Poor  Fund of Knowledge:  Poor  Language:  Poor  Akathisia:  No  Handed:    AIMS (if indicated):     Assets:  Architect Housing  ADL's:  Intact  Cognition:  Impaired,  Mild  Sleep:  Number of Hours:                                                           Treatment Plan Summary: Daily contact with patient to assess and evaluate symptoms and progress in treatment and Medication management 41 y/o male with depression, SI and hallucinations. Multiple prior hospitalization and prior suicidal attempts. Likely Schizoaffective diosorder.   The patient has not been participating in programming and has been in his room most of the time. Appears quite anergic.  Patient has history of abusing opiates, Lyrica, Remeron and Flexeril.  Depressive symptoms: continue Effexor which has been increased to 150 mg po q am  Psychosis: he is to slowly be improving with olanzapine which is currently at 10 mg daily at bedtime.  Pt was taking abilify at admission that was likely causing akathisia  Akathisia: continue amantadine 100 mg po bid  Insomnia: trazodone for sleep  History of abusing opiates, Flexeril, Lyrica and mirtazapine. We'll benefit from continuing outpatient substance abuse services. We'll not discharge him with controlled substances.    For diabetes: continue  NovoLog 15units 3 times a day with meals. Lantus 30 units at bedtime. Patient has orders for supplemental insulin. Also continue metformin 500 mg twice a day.  Patient is currently being followed by hospitalist service  Hypertension: Continue lisinopril 20 mg a day and hydrochlorothiazide 25 mg a day  Dyslipidemia continue Zocor 40 mg at bedtime  Asthma continue Singulair 10 mg at bedtime  Tobacco use disorder continue nicotine patch 21 mg a day  Unsteady gait: No evidence of unsteady gait today. Continue  fall precautions  Labs: hemoglobin A1c, lipid panel, TSH all  completed.  Diet carb modified and low sodium  Vital signs daily  Precautions every 15 minute checks  Hospitalization status now voluntary  Disposition: Once stable the patient will be discharged back to his group home.    Follow up: continue to f/u with [facility] in [place].  Continue with peers support through [facility] in [place].  Peers support is [name]'s phone number is [phone]. Also attending day program.  Possible discharge in the next 3- 5 days  Beverly Sessions, MD 07/12/2016, 12:59 PMPatient ID: Tony Cunningham, male   DOB: [birth year], 41 y.o.   MRN: [id number]

## 2016-07-12 NOTE — Progress Notes (Signed)
Pt denies SI/HI/AVH. Guarded. Visible in milieu minimal interaction with peers. Denies pain. Affect blunted. Medication compliant. Voices no additional concerns at this time. Safety maintained. Will continue to monitor 

## 2016-07-12 NOTE — Progress Notes (Signed)
Patient affect slightly brighter, speech quiet with good eye contact. Verbalizes needs appropriately to staff. Quiet with peers. MD into visit. Blood glucose monitored and recorded with no s/s of hypo/hyperglycemia. No SI/HI at this time. Safety maintained.

## 2016-07-12 NOTE — Plan of Care (Signed)
Problem: Activity: Goal: Interest or engagement in activities will improve Outcome: Progressing Pt visible in dayroom interacting appropriately with staff and peers during free time.

## 2016-07-12 NOTE — Plan of Care (Signed)
Problem: Coping: Goal: Ability to use eye contact when communicating with others will improve Outcome: Progressing Pt does not avoid eye contact when communicating with others, but eye contact noted to be intense at times.

## 2016-07-12 NOTE — Progress Notes (Signed)
Pt denies SI/HI/AVH. Guarded. Visible in milieu minimal interaction with peers. Denies pain. Affect blunted. Medication compliant. Voices no additional concerns at this time. Safety maintained. Will continue to monitor

## 2016-07-13 LAB — GLUCOSE, CAPILLARY
GLUCOSE-CAPILLARY: 231 mg/dL — AB (ref 65–99)
Glucose-Capillary: 316 mg/dL — ABNORMAL HIGH (ref 65–99)
Glucose-Capillary: 120 mg/dL — ABNORMAL HIGH (ref 65–99)
Glucose-Capillary: 170 mg/dL — ABNORMAL HIGH (ref 65–99)

## 2016-07-13 MED ORDER — INSULIN ASPART 100 UNIT/ML ~~LOC~~ SOLN
17.0000 [IU] | Freq: Three times a day (TID) | SUBCUTANEOUS | Status: DC
  Administered 2016-07-13 – 2016-07-17 (×11): 17 [IU] via SUBCUTANEOUS
  Filled 2016-07-13 (×7): qty 17

## 2016-07-13 MED ORDER — OLANZAPINE 5 MG PO TABS
15.0000 mg | ORAL_TABLET | Freq: Every day | ORAL | Status: DC
  Administered 2016-07-13 – 2016-07-14 (×2): 15 mg via ORAL
  Filled 2016-07-13 (×2): qty 1
  Filled 2016-07-13: qty 2

## 2016-07-13 MED ORDER — INSULIN GLARGINE 100 UNIT/ML ~~LOC~~ SOLN
34.0000 [IU] | Freq: Every day | SUBCUTANEOUS | Status: DC
  Administered 2016-07-13: 34 [IU] via SUBCUTANEOUS
  Filled 2016-07-13 (×2): qty 0.34

## 2016-07-13 MED ORDER — VENLAFAXINE HCL ER 75 MG PO CP24
225.0000 mg | ORAL_CAPSULE | Freq: Every day | ORAL | Status: DC
  Administered 2016-07-14: 225 mg via ORAL
  Filled 2016-07-13: qty 3

## 2016-07-13 NOTE — Progress Notes (Signed)
Pt denies SI/HI/AVH, although at times appears to be preoccupied with internal stimuli. Walking around unit with fists clenched. Stated "I wish they hadn't stopped the Ativan". Eye contact, staring. MD notified and one time order for Vistaril 50 was given for anxiety, provided relief. Pt medication compliant. Visible in dayroom with minimal interaction. Denies pain. Cooperative with plan of care. Safety maintained. Will continue to monitor.

## 2016-07-13 NOTE — Progress Notes (Signed)
Recreation Therapy Notes  Date: 02.19.18 Time: 9:30 am Location: Day Room on [facility]   Group Topic: Self-expression   Goal Area(s) Addresses:  Patient will be able to identify a color that represents each emotion. Patient will verbalize benefit of using as a means of self-expression. Patient will verbalize one emotion experienced while participating in activity.   Behavioral Response: Attentive, Interactive   Intervention: The Colors Within Me   Activity: Patients were given a blank face worksheet and were instructed to pick a color for each emotion they were feeling and show on the worksheet how much of that emotion they were feeling.   Education: LRT educated patients on other forms of self-expression.   Education Outcome: In group clarification offered   Clinical Observations/Feedback: Patient picked a color for each emotion he was feeling and showed on the worksheet how much of that emotion he was feeling. Patient contributed to group discussion by stating what emotions he was feeling.  Jacquelynn Cree, LRT/CTRS 07/13/2016 10:22 AM

## 2016-07-13 NOTE — Progress Notes (Signed)
Patient presents with sad affect but brightens on approach. Denies SI, HI, AVH. Pt noted smiling, but minimal interaction with staff and peers. Isolates to self and room, comes out mainly for meals. No complaints of anxiety. Encouragement and support offered. Medications given as prescribed. Safety checks maintained.  Pt receptive and remains safe on unit with q 15 min checks.

## 2016-07-13 NOTE — Plan of Care (Signed)
Problem: Education: Goal: Emotional status will improve Outcome: Progressing Patient noted smiling. Denies SI, HI, AVH. Reports feeling better.

## 2016-07-13 NOTE — Progress Notes (Addendum)
Inpatient Diabetes Program Recommendations  AACE/ADA: New Consensus Statement on Inpatient Glycemic Control (2015)  Target Ranges:  Prepandial:   less than 140 mg/dL      Peak postprandial:   less than 180 mg/dL (1-2 hours)      Critically ill patients:  140 - 180 mg/dL   Results for FREEMAN, BORBA (MRN [id number]) as of 07/13/2016 07:59  Ref. Range 07/12/2016 06:27 07/12/2016 11:47 07/12/2016 15:50 07/12/2016 20:06 07/13/2016 06:29  Glucose-Capillary Latest Ref Range: 65 - 99 mg/dL 350 (H) 92 093 (H) 818 (H) 316 (H)   Review of Glycemic Control   Current orders for Inpatient glycemic control: Lantus 30 units QHS, Novolog 15 units TID with meals, Novolog 0-15 units TID with meals, Novolog 0-5 units QHS, Metformin 1000 mg BID  Inpatient Diabetes Program Recommendations: Insulin - Basal: Please consider increasing Lantus to 34 units QHS. Insulin - Meal Coverage: Please consider increasing meal coverage to Novolog 17 units TID with meals.  Thanks, Orlando Penner, RN, MSN, CDE Diabetes Coordinator Inpatient Diabetes Program [phone] (Team Pager from 8am to 5pm)

## 2016-07-13 NOTE — [facility] Group Notes (Signed)
[facility] Group Notes:  (Nursing/MHT/Case Management/Adjunct)  Date:  07/13/2016  Time:  10:24 PM  Type of Therapy:  Psychoeducational Skills  Participation Level:  Active  Participation Quality:  Appropriate  Affect:  Appropriate  Cognitive:  Alert  Insight:  Good  Engagement in Group:  Engaged  Modes of Intervention:  Activity  Summary of Progress/Problems:  Tony Cunningham 07/13/2016, 10:24 PM

## 2016-07-13 NOTE — Progress Notes (Signed)
[facility] MD Progress Note  07/13/2016 12:42 PM Tony Cunningham  MRN:  [id number] Subjective:  41 y/o male came to our ER on 2/11 by EMS due to pain in back of head, stated he had trouble concentrating and remembering things for past 3-4. Pt stated he had been shaking for past 3 days and had not slept in 4 days. Patient reported auditory hallucinations telling him to walk into traffic.  He later reported having command hallucinations telling him to cut himself and bleed out.  Said he had prior suicidal attempts in the past which were commanded by hallucinations.   Patient lives in a group home 336 [phone] [facility]) and has been there since December 2017. Prior to that he was living with his mother in [place].  Patient has peers support services through [facility] in [place]. He goes to [facility] in [place].  2/19 patient says he does not feel ready for discharge. He says "I know my body". Patient says he feels very anxious and has racing thoughts. He continues to be very vague in his answers. He is withdrawn and stays in his room a lot. He needs to be prompted in order to participate in programming. He denies auditory or visual hallucinations. He denies suicidality or homicidality.  Per nursing: Pt denies SI/HI/AVH, although at times appears to be preoccupied with internal stimuli. Walking around unit with fists clenched. Stated "I wish they hadn't stopped the Ativan". Eye contact, staring. MD notified and one time order for Vistaril 50 was given for anxiety, provided relief. Pt medication compliant. Visible in dayroom with minimal interaction. Denies pain. Cooperative with plan of care. Safety maintained. Will continue to monitor.  Principal Problem: Schizoaffective disorder (HCC) Diagnosis:   Patient Active Problem List   Diagnosis Date Noted  . Tobacco use disorder [F17.200] 07/07/2016  . Dyslipidemia [E78.5] 07/07/2016  . Asthma [J45.909]  07/07/2016  . Schizoaffective disorder (HCC) [F25.9] 07/07/2016  . HTN (hypertension) [I10] 07/06/2016  . Diabetes (HCC) [E11.9] 12/25/2010   Total Time spent with patient: 30 minutes  Past Psychiatric History: Patient has a long history of mental illness and has had multiple hospitalizations including multiple hospitalizations in [place]. Most of those records are unavailable to me. 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 Not sure whether he ever returns to an adequate baseline. He tells me that he has tried to cut himself and kill himself in the past. Denies any history of violence. He can't remember the names of any of his medicines. Outpatient medicines listed on admission included Abilify and Effexor.  Also hospitalized at [facility], [place].  Per review of records looks like he has been only diagnosed with major depressive disorder, he also has been diagnosed with polysubstance abuse that he has abused prescription medications.   Past Medical History: Has diabetes and is insulin-dependent. Also high blood pressure and possible COPD Past Medical History:  Diagnosis Date  . Anxiety   . Asthma   . Diabetes mellitus   . High blood pressure   . Sinus complaint    History reviewed. No pertinent surgical history.  Family History: History reviewed. No pertinent family history.  Family Psychiatric  History: unknown pt was uncooperative  Social History: Living in a group home. Has been there about 4-6 months he estimates. Says that he does not like it.  11 grade education.  Does not have a legal guardian. History  Alcohol Use No     History  Drug Use No    Social History   Social History  . Marital status: Single    Spouse name: [name]  . Number of children: [name]  . Years of education: [name]   Social History Main Topics  . Smoking status: Current Every Day Smoker    Packs/day: 0.50    Types: Cigarettes  .  Smokeless tobacco: Never Used  . Alcohol use No  . Drug use: No  . Sexual activity: Not Currently   Other Topics Concern  . None   Social History Narrative  . None     Current Medications: Current Facility-Administered Medications  Medication Dose Route Frequency Provider Last Rate Last Dose  . acetaminophen (TYLENOL) tablet 650 mg  650 mg Oral Q6H PRN [name], MD   650 mg at 07/06/16 2120  . alum & mag hydroxide-simeth (MAALOX/MYLANTA) 200-200-20 MG/5ML suspension 30 mL  30 mL Oral Q4H PRN [name], MD      . amantadine (SYMMETREL) capsule 100 mg  100 mg Oral BID [name], MD   100 mg at 07/13/16 0827  . lisinopril (PRINIVIL,ZESTRIL) tablet 20 mg  20 mg Oral Daily [name], MD   20 mg at 07/13/16 0827   And  . hydrochlorothiazide (HYDRODIURIL) tablet 25 mg  25 mg Oral Daily [name], MD   25 mg at 07/13/16 0827  . insulin aspart (novoLOG) injection 0-15 Units  0-15 Units Subcutaneous TID WC [name], MD   11 Units at 07/13/16 [phone]  . insulin aspart (novoLOG) injection 0-5 Units  0-5 Units Subcutaneous QHS [name], MD   2 Units at 07/10/16 2142  . insulin aspart (novoLOG) injection 17 Units  17 Units Subcutaneous TID WC [name], MD   17 Units at 07/13/16 1204  . insulin glargine (LANTUS) injection 34 Units  34 Units Subcutaneous QHS [name], MD      . magnesium hydroxide (MILK OF MAGNESIA) suspension 30 mL  30 mL Oral Daily PRN [name], MD      . metFORMIN (GLUCOPHAGE) tablet 1,000 mg  1,000 mg Oral BID WC [name], MD   1,000 mg at 07/13/16 0827  . montelukast (SINGULAIR) tablet 10 mg  10 mg Oral QHS [name], MD   10 mg at 07/12/16 2209  . nicotine (NICODERM CQ - dosed in mg/24 hours) patch 21 mg  21 mg Transdermal Daily [name], MD   21 mg at 07/13/16 0827  . OLANZapine (ZYPREXA) tablet 15 mg  15 mg Oral QHS [name], MD      .  simvastatin (ZOCOR) tablet 40 mg  40 mg Oral QHS [name], MD   40 mg at 07/12/16 2209  . traZODone (DESYREL) tablet 50 mg  50 mg Oral QHS PRN Beverly Sessions, MD   50 mg at 07/11/16 2158  . [START ON 07/14/2016] venlafaxine XR (EFFEXOR-XR) 24 hr capsule 225 mg  225 mg Oral Q breakfast [name], MD        Lab Results:  Results for orders placed or performed during the hospital encounter of 07/06/16 (from the past 48 hour(s))  Glucose, capillary     Status: Abnormal   Collection Time: 07/11/16  4:22 PM  Result Value Ref Range   Glucose-Capillary 134 (H) 65 - 99 mg/dL  Glucose, capillary     Status: Abnormal   Collection Time: 07/11/16  8:17  PM  Result Value Ref Range   Glucose-Capillary 155 (H) 65 - 99 mg/dL  Glucose, capillary     Status: Abnormal   Collection Time: 07/12/16  6:27 AM  Result Value Ref Range   Glucose-Capillary 266 (H) 65 - 99 mg/dL  Glucose, capillary     Status: None   Collection Time: 07/12/16 11:47 AM  Result Value Ref Range   Glucose-Capillary 92 65 - 99 mg/dL  Glucose, capillary     Status: Abnormal   Collection Time: 07/12/16  3:50 PM  Result Value Ref Range   Glucose-Capillary 290 (H) 65 - 99 mg/dL  Glucose, capillary     Status: Abnormal   Collection Time: 07/12/16  8:06 PM  Result Value Ref Range   Glucose-Capillary 122 (H) 65 - 99 mg/dL  Glucose, capillary     Status: Abnormal   Collection Time: 07/13/16  6:29 AM  Result Value Ref Range   Glucose-Capillary 316 (H) 65 - 99 mg/dL  Glucose, capillary     Status: Abnormal   Collection Time: 07/13/16 11:14 AM  Result Value Ref Range   Glucose-Capillary 120 (H) 65 - 99 mg/dL    Blood Alcohol level:  Lab Results  Component Value Date   ETH <5 07/05/2016    Metabolic Disorder Labs: Lab Results  Component Value Date   HGBA1C 12.8 (H) 07/07/2016   MPG 321 07/07/2016   MPG 240 02/09/2016   Lab Results  Component Value Date   PROLACTIN 3.4 (L) 07/07/2016   PROLACTIN 3.0  (L) 02/08/2016   Lab Results  Component Value Date   CHOL 120 07/07/2016   TRIG 153 (H) 07/07/2016   HDL 33 (L) 07/07/2016   CHOLHDL 3.6 07/07/2016   VLDL 31 07/07/2016   LDLCALC 56 07/07/2016   LDLCALC 99 02/08/2016    Physical Findings: AIMS: Facial and Oral Movements Muscles of Facial Expression: Mild Lips and Perioral Area: Moderate Jaw: Mild Tongue: Minimal,Extremity Movements Upper (arms, wrists, hands, fingers): None, normal Lower (legs, knees, ankles, toes): None, normal, Trunk Movements Neck, shoulders, hips: None, normal, Overall Severity Severity of abnormal movements (highest score from questions above): Minimal Incapacitation due to abnormal movements: None, normal Patient's awareness of abnormal movements (rate only patient's report): No Awareness, Dental Status Current problems with teeth and/or dentures?: No Does patient usually wear dentures?: No (Patient on Cogentin)  CIWA:    COWS:     Musculoskeletal: Strength & Muscle Tone: within normal limits Gait & Station: shuffle Patient leans: [name]  Psychiatric Specialty Exam: Physical Exam  Constitutional: He is oriented to person, place, and time. He appears well-developed and well-nourished.  HENT:  Head: Normocephalic and atraumatic.  Eyes: Conjunctivae and EOM are normal.  Neck: Normal range of motion.  Respiratory: Effort normal.  Musculoskeletal: Normal range of motion.  Neurological: He is alert and oriented to person, place, and time.    Review of Systems  Constitutional: Negative.   HENT: Negative.   Eyes: Negative.   Respiratory: Negative.   Cardiovascular: Negative.   Gastrointestinal: Negative.   Genitourinary: Negative.   Skin: Negative.   Neurological: Negative.   Endo/Heme/Allergies: Negative.   Psychiatric/Behavioral: Positive for depression. Negative for hallucinations, memory loss, substance abuse and suicidal ideas. The patient is nervous/anxious. The patient does not have  insomnia.     Blood pressure 104/71, pulse 61, temperature 98.2 F (36.8 C), temperature source Oral, resp. rate 18, height 6' (1.829 m), weight 74.8 kg (165 lb), SpO2 99 %.Body mass index is 22.38  kg/m.  General Appearance: Fairly Groomed  Eye Contact:  Minimal  Speech:  Slow  Volume:  Decreased  Mood:  Dysphoric  Affect:  Blunt  Thought Process:  Linear and Descriptions of Associations: Intact  Orientation:  Full (Time, Place, and Person)  Thought Content:  Hallucinations: None  Suicidal Thoughts:  No  Homicidal Thoughts:  No  Memory:  Immediate;   Poor Recent;   Poor Remote;   Poor  Judgement:  Impaired  Insight:  Lacking  Psychomotor Activity:  Decreased  Concentration:  Concentration: Poor and Attention Span: Poor  Recall:  Poor  Fund of Knowledge:  Poor  Language:  Poor  Akathisia:  No  Handed:    AIMS (if indicated):     Assets:  Architect Housing  ADL's:  Intact  Cognition:  Impaired,  Mild  Sleep:  Number of Hours: 6.15     Treatment Plan Summary:  Patient tells me he is still not ready for discharge. He complains of having severe anxiety and racing thoughts.  41 y/o male with depression, SI and hallucinations.  Multiple prior hospitalization and prior suicidal attempts.  Likely Schizoaffective diosorder.  Not much different since admission, answers are still very basic and sure. The patient has not been participating in programming and has been in his room most of the time. Appears quite anergic.  Per review of records patient has history of abusing opiates, Lyrica, Remeron and Flexeril. Here he has been frequently asking for Ativan or clonazepam.  Depressive symptoms: continue Effexor XR. I will increase the dose of Effexor XR to 300 mg today  Psychosis: he is to slowly be improving with olanzapine. Today due to his concerns with no improvement of his anxiety and racing thoughts I plan to increase the olanzapine to  15 mg at night.  Pt was taking abilify at admission that was likely causing akathisia  Akathisia: continue amantadine 100 mg po bid  Insomnia: sleeping well at night  History of abusing opiates, Flexeril, Lyrica and mirtazapine. We'll benefit from continuing outpatient substance abuse services. We'll not discharge him with controlled substances.  Keeps asking nurses for klonopin   For diabetes: continue  NovoLog but will increase to 17 units 3 times a day with meals. Lantus will be increased to 34 units at bedtime. Patient has orders for supplemental insulin.  Also continue metformin 500 mg twice a day.   Patient is currently being followed by hospitalist service. Recommendations received today from diabetes coordinator  Hypertension: Continue lisinopril 20 mg a day and hydrochlorothiazide 25 mg a day  Dyslipidemia continue Zocor 40 mg at bedtime  Asthma continue Singulair 10 mg at bedtime  Tobacco use disorder continue nicotine patch 21 mg a day  Unsteady gait: No evidence of unsteady gait today.   Labs: hemoglobin A1c, lipid panel, TSH all completed.  Diet carb modified and low sodium  Vital signs daily  Precautions every 15 minute checks  Hospitalization status now voluntary  Disposition: Once stable the patient will be discharged back to his group home.   Follow up: continue to f/u with [facility] in [place].  Continue with peers support through [facility] in [place].  Peers support is [name]'s phone number is [phone]. Also attending day program.  Possible discharge in the next 3 days.    [name], MD 07/13/2016, 12:42 PM

## 2016-07-13 NOTE — [facility] Group Notes (Signed)
[facility] Group Notes:  (Nursing/MHT/Case Management/Adjunct)  Date:  07/13/2016  Time:  1:16 AM  Type of Therapy:  Group Therapy  Participation Level:  Active  Participation Quality:  Appropriate  Affect:  Appropriate  Cognitive:  Appropriate  Insight:  Good and Improving  Engagement in Group:  Developing/Improving  Modes of Intervention:  n/a  Summary of Progress/Problems:  Tony Cunningham 07/13/2016, 1:16 AM

## 2016-07-13 NOTE — [facility] Group Notes (Signed)
[facility] LCSW Group Therapy Note  Date/Time: 07/13/2016, 1:00pm  Type of Therapy and Topic:  Group Therapy:  Overcoming Obstacles  Participation Level:  Minimal  Description of Group:    In this group patients will be encouraged to explore what they see as obstacles to their own wellness and recovery. They will be guided to discuss their thoughts, feelings, and behaviors related to these obstacles. The group will process together ways to cope with barriers, with attention given to specific choices patients can make. Each patient will be challenged to identify changes they are motivated to make in order to overcome their obstacles. This group will be process-oriented, with patients participating in exploration of their own experiences as well as giving and receiving support and challenge from other group members.  Therapeutic Goals: 1. Patient will identify personal and current obstacles as they relate to admission. 2. Patient will identify barriers that currently interfere with their wellness or overcoming obstacles.  3. Patient will identify feelings, thought process and behaviors related to these barriers. 4. Patient will identify two changes they are willing to make to overcome these obstacles:    Summary of Patient Progress   Pt quiet during group without much participation but afterward stayed to talk with CSW about his continued anxiety and feeling as though thoughts are disorganized and he's having trouble focusing.   Therapeutic Modalities:   Cognitive Behavioral Therapy Solution Focused Therapy Motivational Interviewing Relapse Prevention Therapy  Jake Shark, MSW, LCSW

## 2016-07-13 NOTE — Plan of Care (Signed)
Problem: [facility] Participation in Recreation Therapeutic Interventions Goal: STG-Patient will demonstrate improved self esteem by identif STG: Self-Esteem - Within 4 treatment sessions, patient will verbalize at least 5 positive affirmation statements in each of 2 treatment sessions to increase self-esteem.  Outcome: Completed/Met Date Met: 07/13/16 Treatment Session 2; Completed 2 out of 2: At approximately 11:30 am, LRT met with patient in consultation room. Patient verbalized 5 positive affirmation statements. Patient reported it felt good. LRT encouraged patient to continue saying positive affirmation statements.  Leonette Monarch, LRT/CTRS 02.19.18 12:07 pm Goal: STG-Other Recreation Therapy Goal (Specify) STG: Stress Management - Within 4 treatment sessions, patient will verbalize understanding of the stress management techniques in each of 2 treatment sessions to increase stress management skills.  Outcome: Completed/Met Date Met: 07/13/16 Treatment Session 2; Completed 2 out of 2: At approximately 11:30 am, LRT met with patient in consultation room. Patient verbalized understanding of the stress management techniques. LRT encouraged patient to use the techniques.  Leonette Monarch, LRT/CTRS  02.19.18 12:09 pm

## 2016-07-14 LAB — GLUCOSE, CAPILLARY
GLUCOSE-CAPILLARY: 239 mg/dL — AB (ref 65–99)
GLUCOSE-CAPILLARY: 322 mg/dL — AB (ref 65–99)
GLUCOSE-CAPILLARY: 129 mg/dL — AB (ref 65–99)
Glucose-Capillary: 77 mg/dL (ref 65–99)

## 2016-07-14 MED ORDER — INSULIN GLARGINE 100 UNIT/ML ~~LOC~~ SOLN
37.0000 [IU] | Freq: Every day | SUBCUTANEOUS | Status: DC
  Administered 2016-07-14 – 2016-07-16 (×3): 37 [IU] via SUBCUTANEOUS
  Filled 2016-07-14 (×4): qty 0.37

## 2016-07-14 MED ORDER — INSULIN ASPART 100 UNIT/ML ~~LOC~~ SOLN
8.0000 [IU] | Freq: Once | SUBCUTANEOUS | Status: AC
  Administered 2016-07-14: 8 [IU] via SUBCUTANEOUS
  Filled 2016-07-14: qty 8

## 2016-07-14 MED ORDER — VENLAFAXINE HCL ER 75 MG PO CP24
300.0000 mg | ORAL_CAPSULE | Freq: Every day | ORAL | Status: DC
  Administered 2016-07-15 – 2016-07-17 (×3): 300 mg via ORAL
  Filled 2016-07-14 (×3): qty 4

## 2016-07-14 NOTE — Progress Notes (Signed)
Inpatient Diabetes Program Recommendations  AACE/ADA: New Consensus Statement on Inpatient Glycemic Control (2015)  Target Ranges:  Prepandial:   less than 140 mg/dL      Peak postprandial:   less than 180 mg/dL (1-2 hours)      Critically ill patients:  140 - 180 mg/dL  Results for JASER, FULLEN (MRN [id number]) as of 07/14/2016 07:40  Ref. Range 07/13/2016 06:29 07/13/2016 11:14 07/13/2016 16:24 07/13/2016 21:15 07/14/2016 07:29  Glucose-Capillary Latest Ref Range: 65 - 99 mg/dL 194 (H) 174 (H) 081 (H) 231 (H) 239 (H)    Review of Glycemic Control  Current orders for Inpatient glycemic control:  Lantus 34 units QHS, Novolog 17 units TID with meals, Novolog 0-15 units TID with meals, Novolog 0-5 units QHS, Metformin 1000 mg BID  Inpatient Diabetes Program Recommendations:  Insulin - Basal: Plase consider increasing Lantus to 37 units QHS.  Thanks, Orlando Penner, RN, MSN, CDE Diabetes Coordinator Inpatient Diabetes Program [phone] (Team Pager from 8am to 5pm)

## 2016-07-14 NOTE — Progress Notes (Signed)
Recreation Therapy Notes  Date: 02.20.18 Time: 9:30 am Location: Day Room on [facility]   Group Topic: Goal Setting   Goal Area(s) Addresses:  Patient will be able to identify one goal. Patient will verbalize benefit of setting goals.   Behavioral Response: Arrived late,Attentive   Intervention: Step By Step   Activity: Patients were given a worksheet with a foot on it and were instructed to write a goal inside the foot. Patients were instructed to write supportive statements on the outside of the foot.   Education: LRT educated patients on ways to celebrate reaching their goals in a healthy way.   Education Outcome: In group clarification offered   Clinical Observations/Feedback: Patient arrived to group at approximately 9:55 am. LRT explained activity. Patient wrote goal. Patient did not contribute to group discussion.  Jacquelynn Cree, LRT/CTRS 07/14/2016 10:11 AM

## 2016-07-14 NOTE — Progress Notes (Signed)
Patient ID: Tony Cunningham, male   DOB: 06/25/1975, 40 y.o.   MRN: [id number] Minimal interaction with others, but visible in and out of his room, gait steady, appearance slightly improved, still asking for more medications; TD - lip smacking and involuntarily moving noted, denied any CAH - Command Auditory Hallucinations, denied SI/SIB.

## 2016-07-14 NOTE — Plan of Care (Addendum)
Problem: Activity: Goal: Sleeping patterns will improve Outcome: Progressing Patient slept for Estimated Hours of 7.45; every 15 minutes safety round maintained, no injury or falls during this shift.    

## 2016-07-14 NOTE — Progress Notes (Addendum)
Patient with depressed affect, withdrawn, quiet with peers. Quiet even when staff initiates interaction. No SI/HI at this time. Minimal interaction with peers. Rests in bed in room during free time. Safety maintained. Blood glucose monitored and recorded. Blood glucose 77 at noon time. MD aware and Diabetic coordination notified. Hold 17 units and administer 8 units with meal. No s/s of hyper/hypoglycemia.

## 2016-07-14 NOTE — Progress Notes (Addendum)
[facility] MD Progress Note  07/14/2016 10:54 AM Tony Cunningham  MRN:  [id number] Subjective:  41 y/o male came to our ER on 2/11 by EMS due to pain in back of head, stated he had trouble concentrating and remembering things for past 3-4. Pt stated he had been shaking for past 3 days and had not slept in 4 days. Patient reported auditory hallucinations telling him to walk into traffic.  He later reported having command hallucinations telling him to cut himself and bleed out.  Said he had prior suicidal attempts in the past which were commanded by hallucinations.   Patient lives in a group home [phone] [facility]) and has been there since December 2017. Prior to that he was living with his mother in [place].  Patient has peers support services through [facility] in [place]. He goes to [facility] in [place].  2/19 patient says he does not feel ready for discharge. He says "I know my body". Patient says he feels very anxious and has racing thoughts. He continues to be very vague in his answers. He is withdrawn and stays in his room a lot. He needs to be prompted in order to participate in programming. He denies auditory or visual hallucinations. He denies suicidality or homicidality.  2/20 patient was found in his room relating the morning. Lying in bed with the lights off. Once again his report of issues and problems is very limited. His answers are very vague. Patient is really unable to express how he feels. He says he is a little bit better than yesterday but at the same time tells me he doesn't feel his medications are working. He tells me he is very tired but then contradicted himself just minutes later. He was encouraged to attend group which he died. Social worker told me that he appears to be preoccupied as if he were having hallucinations. He however has been consistently denying having hallucinations. Some of the nurses also over the weekend felt  that he could have been interacting to internal stimuli. Yesterday dose of olanzapine was increased from 10 mg to 15.  Per nursing: Patient with depressed affect, withdrawn, quiet with peers. Quiet even when staff initiates interaction. No SI/HI at this time. Minimal interaction with peers. Rests in bed in room during free time. Safety maintained.   Principal Problem: Schizoaffective disorder (HCC) Diagnosis:   Patient Active Problem List   Diagnosis Date Noted  . Tobacco use disorder [F17.200] 07/07/2016  . Dyslipidemia [E78.5] 07/07/2016  . Asthma [J45.909] 07/07/2016  . Schizoaffective disorder (HCC) [F25.9] 07/07/2016  . HTN (hypertension) [I10] 07/06/2016  . Diabetes (HCC) [E11.9] 12/25/2010   Total Time spent with patient: 30 minutes  Past Psychiatric History: Patient has a long history of mental illness and has had multiple hospitalizations including multiple hospitalizations in [place]. Most of those records are unavailable to me. 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 Not sure whether he ever returns to an adequate baseline. He tells me that he has tried to cut himself and kill himself in the past. Denies any history of violence. He can't remember the names of any of his medicines. Outpatient medicines listed on admission included Abilify and Effexor.  Also hospitalized at [facility], [place].  Per review of records looks like he has been only diagnosed with major depressive disorder, he also has been diagnosed with polysubstance abuse that he has abused prescription  medications.   Past Medical History: Has diabetes and is insulin-dependent. Also high blood pressure and possible COPD Past Medical History:  Diagnosis Date  . Anxiety   . Asthma   . Diabetes mellitus   . High blood pressure   . Sinus complaint    History reviewed. No pertinent surgical history.  Family History: History reviewed. No pertinent  family history.  Family Psychiatric  History: unknown pt was uncooperative  Social History: Living in a group home. Has been there about 4-6 months he estimates. Says that he does not like it.  11 grade education.  Does not have a legal guardian. History  Alcohol Use No     History  Drug Use No    Social History   Social History  . Marital status: Single    Spouse name: [name]  . Number of children: [name]  . Years of education: [name]   Social History Main Topics  . Smoking status: Current Every Day Smoker    Packs/day: 0.50    Types: Cigarettes  . Smokeless tobacco: Never Used  . Alcohol use No  . Drug use: No  . Sexual activity: Not Currently   Other Topics Concern  . None   Social History Narrative  . None     Current Medications: Current Facility-Administered Medications  Medication Dose Route Frequency Provider Last Rate Last Dose  . acetaminophen (TYLENOL) tablet 650 mg  650 mg Oral Q6H PRN [name], MD   650 mg at 07/06/16 2120  . alum & mag hydroxide-simeth (MAALOX/MYLANTA) 200-200-20 MG/5ML suspension 30 mL  30 mL Oral Q4H PRN [name], MD      . amantadine (SYMMETREL) capsule 100 mg  100 mg Oral BID [name], MD   100 mg at 07/14/16 0849  . lisinopril (PRINIVIL,ZESTRIL) tablet 20 mg  20 mg Oral Daily [name], MD   20 mg at 07/14/16 0848   And  . hydrochlorothiazide (HYDRODIURIL) tablet 25 mg  25 mg Oral Daily [name], MD   25 mg at 07/14/16 0848  . insulin aspart (novoLOG) injection 0-15 Units  0-15 Units Subcutaneous TID WC [name], MD   5 Units at 07/14/16 0756  . insulin aspart (novoLOG) injection 0-5 Units  0-5 Units Subcutaneous QHS [name], MD   2 Units at 07/13/16 2143  . insulin aspart (novoLOG) injection 17 Units  17 Units Subcutaneous TID WC [name], MD   17 Units at 07/14/16 0755  . insulin glargine (LANTUS) injection 37 Units  37 Units Subcutaneous QHS [name], MD      . magnesium hydroxide (MILK OF MAGNESIA) suspension 30 mL  30 mL Oral Daily PRN [name], MD      . metFORMIN (GLUCOPHAGE) tablet 1,000 mg  1,000 mg Oral BID WC [name], MD   1,000 mg at 07/14/16 0849  . montelukast (SINGULAIR) tablet 10 mg  10 mg Oral QHS [name], MD   10 mg at 07/13/16 2141  . nicotine (NICODERM CQ - dosed in mg/24 hours) patch 21 mg  21 mg Transdermal Daily [name], MD   21 mg at 07/14/16 0848  . OLANZapine (ZYPREXA) tablet 15 mg  15 mg Oral QHS [name], MD   15 mg at 07/13/16 2141  . simvastatin (ZOCOR) tablet 40 mg  40 mg Oral QHS [name], MD   40 mg at 07/13/16 2141  . traZODone (DESYREL) tablet 50  mg  50 mg Oral QHS PRN Beverly Sessions, MD   50 mg at 07/11/16 2158  . venlafaxine XR (EFFEXOR-XR) 24 hr capsule 225 mg  225 mg Oral Q breakfast [name], MD   225 mg at 07/14/16 3151    Lab Results:  Results for orders placed or performed during the hospital encounter of 07/06/16 (from the past 48 hour(s))  Glucose, capillary     Status: None   Collection Time: 07/12/16 11:47 AM  Result Value Ref Range   Glucose-Capillary 92 65 - 99 mg/dL  Glucose, capillary     Status: Abnormal   Collection Time: 07/12/16  3:50 PM  Result Value Ref Range   Glucose-Capillary 290 (H) 65 - 99 mg/dL  Glucose, capillary     Status: Abnormal   Collection Time: 07/12/16  8:06 PM  Result Value Ref Range   Glucose-Capillary 122 (H) 65 - 99 mg/dL  Glucose, capillary     Status: Abnormal   Collection Time: 07/13/16  6:29 AM  Result Value Ref Range   Glucose-Capillary 316 (H) 65 - 99 mg/dL  Glucose, capillary     Status: Abnormal   Collection Time: 07/13/16 11:14 AM  Result Value Ref Range   Glucose-Capillary 120 (H) 65 - 99 mg/dL  Glucose, capillary     Status: Abnormal   Collection Time: 07/13/16  4:24 PM  Result Value Ref Range   Glucose-Capillary 170 (H) 65 - 99 mg/dL  Glucose,  capillary     Status: Abnormal   Collection Time: 07/13/16  9:15 PM  Result Value Ref Range   Glucose-Capillary 231 (H) 65 - 99 mg/dL  Glucose, capillary     Status: Abnormal   Collection Time: 07/14/16  7:29 AM  Result Value Ref Range   Glucose-Capillary 239 (H) 65 - 99 mg/dL    Blood Alcohol level:  Lab Results  Component Value Date   ETH <5 07/05/2016    Metabolic Disorder Labs: Lab Results  Component Value Date   HGBA1C 12.8 (H) 07/07/2016   MPG 321 07/07/2016   MPG 240 02/09/2016   Lab Results  Component Value Date   PROLACTIN 3.4 (L) 07/07/2016   PROLACTIN 3.0 (L) 02/08/2016   Lab Results  Component Value Date   CHOL 120 07/07/2016   TRIG 153 (H) 07/07/2016   HDL 33 (L) 07/07/2016   CHOLHDL 3.6 07/07/2016   VLDL 31 07/07/2016   LDLCALC 56 07/07/2016   LDLCALC 99 02/08/2016    Physical Findings: AIMS: Facial and Oral Movements Muscles of Facial Expression: Mild Lips and Perioral Area: Moderate Jaw: Mild Tongue: Minimal,Extremity Movements Upper (arms, wrists, hands, fingers): None, normal Lower (legs, knees, ankles, toes): None, normal, Trunk Movements Neck, shoulders, hips: None, normal, Overall Severity Severity of abnormal movements (highest score from questions above): Minimal Incapacitation due to abnormal movements: None, normal Patient's awareness of abnormal movements (rate only patient's report): No Awareness, Dental Status Current problems with teeth and/or dentures?: No Does patient usually wear dentures?: No (Patient on Cogentin)  CIWA:    COWS:     Musculoskeletal: Strength & Muscle Tone: within normal limits Gait & Station: shuffle Patient leans: [name]  Psychiatric Specialty Exam: Physical Exam  Constitutional: He is oriented to person, place, and time. He appears well-developed and well-nourished.  HENT:  Head: Normocephalic and atraumatic.  Eyes: Conjunctivae and EOM are normal.  Neck: Normal range of motion.  Respiratory: Effort  normal.  Musculoskeletal: Normal range of motion.  Neurological: He is alert and  oriented to person, place, and time.    Review of Systems  Constitutional: Negative.   HENT: Negative.   Eyes: Negative.   Respiratory: Negative.   Cardiovascular: Negative.   Gastrointestinal: Negative.   Genitourinary: Negative.   Skin: Negative.   Neurological: Negative.   Endo/Heme/Allergies: Negative.   Psychiatric/Behavioral: Positive for depression. Negative for hallucinations, memory loss, substance abuse and suicidal ideas. The patient is nervous/anxious. The patient does not have insomnia.     Blood pressure 107/67, pulse 64, temperature 98.2 F (36.8 C), temperature source Oral, resp. rate 18, height 6' (1.829 m), weight 74.8 kg (165 lb), SpO2 99 %.Body mass index is 22.38 kg/m.  General Appearance: Fairly Groomed  Eye Contact:  Minimal  Speech:  Slow  Volume:  Decreased  Mood:  Dysphoric  Affect:  Blunt  Thought Process:  Linear and Descriptions of Associations: Intact  Orientation:  Full (Time, Place, and Person)  Thought Content:  Hallucinations: None  Suicidal Thoughts:  No  Homicidal Thoughts:  No  Memory:  Immediate;   Poor Recent;   Poor Remote;   Poor  Judgement:  Impaired  Insight:  Lacking  Psychomotor Activity:  Decreased  Concentration:  Concentration: Poor and Attention Span: Poor  Recall:  Poor  Fund of Knowledge:  Poor  Language:  Poor  Akathisia:  No  Handed:    AIMS (if indicated):     Assets:  Architect Housing  ADL's:  Intact  Cognition:  Impaired,  Mild  Sleep:  Number of Hours: 7.45     Treatment Plan Summary:  Patient tells me he is still not ready for discharge. He complains of having severe anxiety and racing thoughts.  41 y/o male with depression, SI and hallucinations.  Multiple prior hospitalization and prior suicidal attempts.  Likely Schizoaffective diosorder.  Not much different since admission,  answers are still very basic and sure. The patient has not been participating in programming and has been in his room most of the time. Appears quite anergic.  Per review of records patient has history of abusing opiates, Lyrica, Remeron and Flexeril. Here he has been frequently asking for Ativan or clonazepam.  Depressive symptoms: continue Effexor XR Which has been titrated up to 300 mg a day  Psychosis: He appears to be slowly improving with olanzapine. Continue olanzapine 15 mg by mouth daily at bedtime (this dose was just increased last night).  Akathisia: continue amantadine 100 mg po bid  Insomnia: sleeping well at night. More than 7 hours  History of abusing opiates, Flexeril, Lyrica and mirtazapine. We'll benefit from continuing outpatient substance abuse services. We'll not discharge him with controlled substances.  Keeps asking nurses for klonopin   For diabetes: continue  NovoLog but will increase to 17 units 3 times a day with meals. Lantus will be increased to 37 units at bedtime. Patient has orders for supplemental insulin.  Also continue metformin 500 mg twice a day.   Patient is currently being followed by hospitalist service. Recommendations received today from diabetes coordinator  Hypertension: Continue lisinopril 20 mg a day and hydrochlorothiazide 25 mg a day  Dyslipidemia continue Zocor 40 mg at bedtime  Asthma continue Singulair 10 mg at bedtime  Tobacco use disorder continue nicotine patch 21 mg a day  Unsteady gait: No evidence of unsteady gait today.   Labs: hemoglobin A1c, lipid panel, TSH all completed.  Diet carb modified and low sodium  Vital signs daily  Precautions every 15 minute checks  Hospitalization status now voluntary  Disposition: Once stable the patient will be discharged back to his group home.   Follow up: continue to f/u with [facility] in [place].  Continue with peers support through [facility] in [place].   Peers support is [name]'s phone number is [phone]. Also attending day program.  Possible discharge in the next 3 days.    [name], MD 07/14/2016, 10:54 AM

## 2016-07-14 NOTE — [facility] Group Notes (Signed)
[facility] LCSW Group Therapy Note  Date/Time:07/14/2016, 3pm  Type of Therapy/Topic:  Group Therapy:  Feelings about Diagnosis  Participation Level:  Active   Mood: States mood okay   Description of Group:    This group will allow patients to explore their thoughts and feelings about diagnoses they have received. Patients will be guided to explore their level of understanding and acceptance of these diagnoses. Facilitator will encourage patients to process their thoughts and feelings about the reactions of others to their diagnosis, and will guide patients in identifying ways to discuss their diagnosis with significant others in their lives. This group will be process-oriented, with patients participating in exploration of their own experiences as well as giving and receiving support and challenge from other group members.   Therapeutic Goals: 1. Patient will demonstrate understanding of diagnosis as evidence by identifying two or more symptoms of the disorder:  2. Patient will be able to express two feelings regarding the diagnosis 3. Patient will demonstrate ability to communicate their needs through discussion and/or role plays  Summary of Patient Progress:    Pt able to meet therapeutic goals listed above.    Therapeutic Modalities:   Cognitive Behavioral Therapy Brief Therapy Feelings Identification   Jake Shark, MSW, LCSW

## 2016-07-15 LAB — GLUCOSE, CAPILLARY
GLUCOSE-CAPILLARY: 267 mg/dL — AB (ref 65–99)
GLUCOSE-CAPILLARY: 181 mg/dL — AB (ref 65–99)
GLUCOSE-CAPILLARY: 135 mg/dL — AB (ref 65–99)
GLUCOSE-CAPILLARY: 141 mg/dL — AB (ref 65–99)

## 2016-07-15 MED ORDER — OLANZAPINE 10 MG PO TABS
20.0000 mg | ORAL_TABLET | Freq: Every day | ORAL | Status: DC
  Administered 2016-07-15 – 2016-07-16 (×2): 20 mg via ORAL
  Filled 2016-07-15 (×2): qty 2

## 2016-07-15 MED ORDER — TRAZODONE HCL 100 MG PO TABS
100.0000 mg | ORAL_TABLET | Freq: Every evening | ORAL | Status: DC | PRN
  Administered 2016-07-15 – 2016-07-16 (×2): 100 mg via ORAL
  Filled 2016-07-15 (×2): qty 1

## 2016-07-15 NOTE — Progress Notes (Signed)
Recreation Therapy Notes  Date: 02.21.18 Time: 9:30 am Location: Craft Room  Group Topic: Self-esteem  Goal Area(s) Addresses:  Patients will be able to identify at least one positive trait about self. Patient will verbalize benefit of having a healthy self-esteem.  Behavioral Response: Did not attend  Intervention: I Am  Activity: Patients were given worksheets with the letter I on it and were instructed to write as many positive traits about themselves inside the letter.  Education: LRT educated patients on ways they can increase their self-esteem.  Education Outcome: Patient did not attend group.   Clinical Observations/Feedback: Patient did not attend group.  Jacquelynn Cree, LRT/CTRS 07/15/2016 10:21 AM

## 2016-07-15 NOTE — [facility] Group Notes (Signed)
[facility] LCSW Group Therapy  07/15/2016 2:08 PM  Type of Therapy:  Group Therapy  Participation Level:  Patient did not attend group. CSW invited patient to group.   Summary of Progress/Problems: Emotional Regulation: Patients will identify both negative and positive emotions. They will discuss emotions they have difficulty regulating and how they impact their lives. Patients will be asked to identify healthy coping skills to combat unhealthy reactions to negative emotions.    Yiannis Tulloch G. Garnette Czech MSW, LCSWA 07/15/2016, 2:09 PM

## 2016-07-15 NOTE — Plan of Care (Signed)
Problem: Activity: Goal: Sleeping patterns will improve Outcome: Progressing Patient slept for Estimated Hours of 6.15; every 15 minutes safety round maintained, no injury or falls during this shift.    

## 2016-07-15 NOTE — Progress Notes (Signed)
Patient ID: Tony Cunningham, male   DOB: [birth year], 41 y.o.   MRN: [id number] Appearance and skin condition remains poorly kept, A&Ox3, denied SI/HI, denied AV/H. Continues to be fixated on medications; "where is my Trazodone, I suppose to get it at bedtime, my doctor ordered it .Marland Kitchen.." Patient was satisfied after I showed him that the medication was discontinued.

## 2016-07-15 NOTE — Plan of Care (Signed)
Problem: Activity: Goal: Sleeping patterns will improve Outcome: Progressing Patient slept for Estimated Hours of 5.45; every 15 minutes safety round maintained, no injury or falls during this shift.    

## 2016-07-15 NOTE — Plan of Care (Signed)
Problem: Education: Goal: Mental status will improve Outcome: Progressing Denies AVH. Smiling out of room more

## 2016-07-15 NOTE — Progress Notes (Signed)
Inpatient Diabetes Program Recommendations  AACE/ADA: New Consensus Statement on Inpatient Glycemic Control (2015)  Target Ranges:  Prepandial:   less than 140 mg/dL      Peak postprandial:   less than 180 mg/dL (1-2 hours)      Critically ill patients:  140 - 180 mg/dL   Results for KAHLEN, MORAIS (MRN [id number]) as of 07/15/2016 12:34  Ref. Range 07/14/2016 07:29 07/14/2016 11:24 07/14/2016 16:33 07/14/2016 20:43  Glucose-Capillary Latest Ref Range: 65 - 99 mg/dL 732 (H) 77 202 (H) 542 (H)   Results for KHAI, ARRONA (MRN [id number]) as of 07/15/2016 12:34  Ref. Range 07/15/2016 06:45 07/15/2016 11:21  Glucose-Capillary Latest Ref Range: 65 - 99 mg/dL 315 (H) 176 (H)     Current orders for Inpatient glycemic control: Lantus 37 units QHS, Novolog 17 units TID with meals, Novolog 0-15 units TID with meals, Novolog 0-5 units QHS, Metformin 1000 mg BID  Inpatient Diabetes Program Recommendations:  Insulin - Basal: Plase consider increasing Lantus to 40 units QHS.     --Will follow patient during hospitalization--  Ambrose Finland RN, MSN, CDE Diabetes Coordinator Inpatient Glycemic Control Team Team Pager: [phone] (8a-5p)

## 2016-07-15 NOTE — Progress Notes (Signed)
[facility] MD Progress Note  07/15/2016 11:35 AM Tony Cunningham  MRN:  [id number] Subjective:  41 y/o male came to our ER on 2/11 by EMS due to pain in back of head, stated he had trouble concentrating and remembering things for past 3-4. Pt stated he had been shaking for past 3 days and had not slept in 4 days. Patient reported auditory hallucinations telling him to walk into traffic.  He later reported having command hallucinations telling him to cut himself and bleed out.  Said he had prior suicidal attempts in the past which were commanded by hallucinations.   Patient lives in a group home [phone] [facility]) and has been there since December 2017. Prior to that he was living with his mother in [place].  Patient has peers support services through [facility] in [place]. He goes to [facility] in [place].  2/19 patient says he does not feel ready for discharge. He says "I know my body". Patient says he feels very anxious and has racing thoughts. He continues to be very vague in his answers. He is withdrawn and stays in his room a lot. He needs to be prompted in order to participate in programming. He denies auditory or visual hallucinations. He denies suicidality or homicidality.  2/20 patient was found in his room relating the morning. Lying in bed with the lights off. Once again his report of issues and problems is very limited. His answers are very vague. Patient is really unable to express how he feels. He says he is a little bit better than yesterday but at the same time tells me he doesn't feel his medications are working. He tells me he is very tired but then contradicted himself just minutes later. He was encouraged to attend group which he died. Social worker told me that he appears to be preoccupied as if he were having hallucinations. He however has been consistently denying having hallucinations. Some of the nurses also over the weekend felt  that he could have been interacting to internal stimuli. Yesterday dose of olanzapine was increased from 10 mg to 15.  2/21 really quite vague. He was found in his room lying in bed with the lights off. He tells me he feels tired. He did not sleep very well last night. His energy is low. His mood is a 4 out of 10 x 10 being the best. His insight is still very high. He tells me his thoughts continue to race but they are not as bad as they were before. He denies suicidality, homicidality or auditory or visual hallucinations. He denies any physical complaints.  Per nursing: Appearance and skin condition remains poorly kept, A&Ox3, denied SI/HI, denied AV/H. Continues to be fixated on medications; "where is my Trazodone, I suppose to get it at bedtime, my doctor ordered it .Marland Kitchen.." Patient was satisfied after I showed him that the medication was discontinued.   Principal Problem: Schizoaffective disorder (HCC) Diagnosis:   Patient Active Problem List   Diagnosis Date Noted  . Tobacco use disorder [F17.200] 07/07/2016  . Dyslipidemia [E78.5] 07/07/2016  . Asthma [J45.909] 07/07/2016  . Schizoaffective disorder (HCC) [F25.9] 07/07/2016  . HTN (hypertension) [I10] 07/06/2016  . Diabetes (HCC) [E11.9] 12/25/2010   Total Time spent with patient: 30 minutes  Past Psychiatric History: Patient has a long history of mental illness and has had multiple hospitalizations including multiple hospitalizations in [place]. Most of those records are unavailable to me. 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 Not sure whether he ever returns to an adequate baseline. He tells me that he has tried to cut himself and kill himself in the past. Denies any history of violence. He can't remember the names of any of his medicines. Outpatient medicines listed on admission included Abilify and Effexor.  Also hospitalized at [facility], [place].  Per review of  records looks like he has been only diagnosed with major depressive disorder, he also has been diagnosed with polysubstance abuse that he has abused prescription medications.   Past Medical History: Has diabetes and is insulin-dependent. Also high blood pressure and possible COPD Past Medical History:  Diagnosis Date  . Anxiety   . Asthma   . Diabetes mellitus   . High blood pressure   . Sinus complaint    History reviewed. No pertinent surgical history.  Family History: History reviewed. No pertinent family history.  Family Psychiatric  History: unknown pt was uncooperative  Social History: Living in a group home. Has been there about 4-6 months he estimates. Says that he does not like it.  11 grade education.  Does not have a legal guardian. History  Alcohol Use No     History  Drug Use No    Social History   Social History  . Marital status: Single    Spouse name: [name]  . Number of children: [name]  . Years of education: [name]   Social History Main Topics  . Smoking status: Current Every Day Smoker    Packs/day: 0.50    Types: Cigarettes  . Smokeless tobacco: Never Used  . Alcohol use No  . Drug use: No  . Sexual activity: Not Currently   Other Topics Concern  . None   Social History Narrative  . None     Current Medications: Current Facility-Administered Medications  Medication Dose Route Frequency Provider Last Rate Last Dose  . acetaminophen (TYLENOL) tablet 650 mg  650 mg Oral Q6H PRN [name], MD   650 mg at 07/06/16 2120  . alum & mag hydroxide-simeth (MAALOX/MYLANTA) 200-200-20 MG/5ML suspension 30 mL  30 mL Oral Q4H PRN [name], MD      . amantadine (SYMMETREL) capsule 100 mg  100 mg Oral BID [name], MD   100 mg at 07/15/16 0826  . lisinopril (PRINIVIL,ZESTRIL) tablet 20 mg  20 mg Oral Daily [name], MD   20 mg at 07/15/16 0825   And  . hydrochlorothiazide (HYDRODIURIL) tablet 25 mg  25 mg Oral Daily [name],  MD   25 mg at 07/15/16 0825  . insulin aspart (novoLOG) injection 0-15 Units  0-15 Units Subcutaneous TID WC [name], MD   3 Units at 07/15/16 1200  . insulin aspart (novoLOG) injection 0-5 Units  0-5 Units Subcutaneous QHS [name], MD   2 Units at 07/13/16 2143  . insulin aspart (novoLOG) injection 17 Units  17 Units Subcutaneous TID WC [name], MD   17 Units at 07/15/16 1123  . insulin glargine (LANTUS) injection 37 Units  37 Units Subcutaneous QHS [name], MD   37 Units at 07/14/16 2210  . magnesium hydroxide (MILK OF MAGNESIA) suspension 30 mL  30 mL Oral Daily PRN [name], MD      . metFORMIN (GLUCOPHAGE) tablet 1,000 mg  1,000 mg Oral BID WC [name], MD   1,000 mg at 07/15/16 0825  .  montelukast (SINGULAIR) tablet 10 mg  10 mg Oral QHS [name], MD   10 mg at 07/14/16 2209  . nicotine (NICODERM CQ - dosed in mg/24 hours) patch 21 mg  21 mg Transdermal Daily [name], MD   21 mg at 07/15/16 0826  . OLANZapine (ZYPREXA) tablet 15 mg  15 mg Oral QHS [name], MD   15 mg at 07/14/16 2210  . simvastatin (ZOCOR) tablet 40 mg  40 mg Oral QHS [name], MD   40 mg at 07/14/16 2210  . venlafaxine XR (EFFEXOR-XR) 24 hr capsule 300 mg  300 mg Oral Q breakfast [name], MD   300 mg at 07/15/16 5597    Lab Results:  Results for orders placed or performed during the hospital encounter of 07/06/16 (from the past 48 hour(s))  Glucose, capillary     Status: Abnormal   Collection Time: 07/13/16  4:24 PM  Result Value Ref Range   Glucose-Capillary 170 (H) 65 - 99 mg/dL  Glucose, capillary     Status: Abnormal   Collection Time: 07/13/16  9:15 PM  Result Value Ref Range   Glucose-Capillary 231 (H) 65 - 99 mg/dL  Glucose, capillary     Status: Abnormal   Collection Time: 07/14/16  7:29 AM  Result Value Ref Range   Glucose-Capillary 239 (H) 65 - 99 mg/dL  Glucose,  capillary     Status: None   Collection Time: 07/14/16 11:24 AM  Result Value Ref Range   Glucose-Capillary 77 65 - 99 mg/dL  Glucose, capillary     Status: Abnormal   Collection Time: 07/14/16  4:33 PM  Result Value Ref Range   Glucose-Capillary 322 (H) 65 - 99 mg/dL   Comment 1 Notify RN   Glucose, capillary     Status: Abnormal   Collection Time: 07/14/16  8:43 PM  Result Value Ref Range   Glucose-Capillary 129 (H) 65 - 99 mg/dL  Glucose, capillary     Status: Abnormal   Collection Time: 07/15/16  6:45 AM  Result Value Ref Range   Glucose-Capillary 267 (H) 65 - 99 mg/dL  Glucose, capillary     Status: Abnormal   Collection Time: 07/15/16 11:21 AM  Result Value Ref Range   Glucose-Capillary 181 (H) 65 - 99 mg/dL    Blood Alcohol level:  Lab Results  Component Value Date   ETH <5 07/05/2016    Metabolic Disorder Labs: Lab Results  Component Value Date   HGBA1C 12.8 (H) 07/07/2016   MPG 321 07/07/2016   MPG 240 02/09/2016   Lab Results  Component Value Date   PROLACTIN 3.4 (L) 07/07/2016   PROLACTIN 3.0 (L) 02/08/2016   Lab Results  Component Value Date   CHOL 120 07/07/2016   TRIG 153 (H) 07/07/2016   HDL 33 (L) 07/07/2016   CHOLHDL 3.6 07/07/2016   VLDL 31 07/07/2016   LDLCALC 56 07/07/2016   LDLCALC 99 02/08/2016    Physical Findings: AIMS: Facial and Oral Movements Muscles of Facial Expression: Mild Lips and Perioral Area: Moderate Jaw: Mild Tongue: Minimal,Extremity Movements Upper (arms, wrists, hands, fingers): None, normal Lower (legs, knees, ankles, toes): None, normal, Trunk Movements Neck, shoulders, hips: None, normal, Overall Severity Severity of abnormal movements (highest score from questions above): Minimal Incapacitation due to abnormal movements: None, normal Patient's awareness of abnormal movements (rate only patient's report): No Awareness, Dental Status Current problems with teeth and/or dentures?: No Does patient usually wear  dentures?: No (  Patient on Cogentin)  CIWA:    COWS:     Musculoskeletal: Strength & Muscle Tone: within normal limits Gait & Station: shuffle Patient leans: [name]  Psychiatric Specialty Exam: Physical Exam  Constitutional: He is oriented to person, place, and time. He appears well-developed and well-nourished.  HENT:  Head: Normocephalic and atraumatic.  Eyes: Conjunctivae and EOM are normal.  Neck: Normal range of motion.  Respiratory: Effort normal.  Musculoskeletal: Normal range of motion.  Neurological: He is alert and oriented to person, place, and time.    Review of Systems  Constitutional: Negative.   HENT: Negative.   Eyes: Negative.   Respiratory: Negative.   Cardiovascular: Negative.   Gastrointestinal: Negative.   Genitourinary: Negative.   Skin: Negative.   Neurological: Negative.   Endo/Heme/Allergies: Negative.   Psychiatric/Behavioral: Positive for depression. Negative for hallucinations, memory loss, substance abuse and suicidal ideas. The patient is nervous/anxious. The patient does not have insomnia.     Blood pressure 105/71, pulse 63, temperature 98.3 F (36.8 C), resp. rate 18, height 6' (1.829 m), weight 74.8 kg (165 lb), SpO2 100 %.Body mass index is 22.38 kg/m.  General Appearance: Fairly Groomed  Eye Contact:  Minimal  Speech:  Slow  Volume:  Decreased  Mood:  Dysphoric  Affect:  Blunt  Thought Process:  Linear and Descriptions of Associations: Intact  Orientation:  Full (Time, Place, and Person)  Thought Content:  Hallucinations: None  Suicidal Thoughts:  No  Homicidal Thoughts:  No  Memory:  Immediate;   Poor Recent;   Poor Remote;   Poor  Judgement:  Impaired  Insight:  Lacking  Psychomotor Activity:  Decreased  Concentration:  Concentration: Poor and Attention Span: Poor  Recall:  Poor  Fund of Knowledge:  Poor  Language:  Poor  Akathisia:  No  Handed:    AIMS (if indicated):     Assets:  Medical laboratory scientific officer Housing  ADL's:  Intact  Cognition:  Impaired,  Mild  Sleep:  Number of Hours: 5.45     Treatment Plan Summary:  Patient tells me he is still not ready for discharge. He complains of having severe anxiety and racing thoughts.  41 y/o male with depression, SI and hallucinations.  Multiple prior hospitalization and prior suicidal attempts.  Likely Schizoaffective diosorder.  Not much different since admission, answers are still very basic and sure. The patient has not been participating in programming and has been in his room most of the time. Appears quite anergic.  Per review of records patient has history of abusing opiates, Lyrica, Remeron and Flexeril. Here he has been frequently asking for Ativan or clonazepam.  Depressive symptoms: continue Effexor XR Which has been titrated up to 300 mg a day  Psychosis: He appears to be slowly improving with olanzapine. Continue olanzapine but we'll increase the dose tonight to 20 mg at bedtime  Akathisia: continue amantadine 100 mg po bid  Insomnia: will target insomnia with zyprexa.  History of abusing opiates, Flexeril, Lyrica and mirtazapine. We'll benefit from continuing outpatient substance abuse services. We'll not discharge him with controlled substances.  Keeps asking nurses for klonopin   For diabetes: continue  NovoLog but will increase to 17 units 3 times a day with meals. Lantus  37 units at bedtime. Patient has orders for supplemental insulin.  Also continue metformin 500 mg twice a day.   Patient is currently being followed by hospitalist service. Recommendations received today from diabetes coordinator  Hypertension: Continue lisinopril 20  mg a day and hydrochlorothiazide 25 mg a day  Dyslipidemia continue Zocor 40 mg at bedtime  Asthma continue Singulair 10 mg at bedtime  Tobacco use disorder continue nicotine patch 21 mg a day  Unsteady gait: No evidence of unsteady gait today.   Labs:  hemoglobin A1c, lipid panel, TSH all completed.  Diet carb modified and low sodium  Vital signs daily  Precautions every 15 minute checks  Hospitalization status now voluntary  Disposition: Once stable the patient will be discharged back to his group home.   Follow up: continue to f/u with [facility] in [place].  Continue with peers support through [facility] in [place].  Peers support is [name]'s phone number is [phone]. Also attending day program.  Possible discharge Friday   [name], MD 07/15/2016, 11:35 AM

## 2016-07-15 NOTE — Tx Team (Signed)
Interdisciplinary Treatment and Diagnostic Plan Update  07/15/2016  Time of Session: 10:30am Fitz Matsuo MRN: [id number]  Principal Diagnosis: Schizoaffective disorder [facility])  Secondary Diagnoses: Principal Problem:   Schizoaffective disorder (HCC) Active Problems:   Diabetes (HCC)   HTN (hypertension)   Tobacco use disorder   Dyslipidemia   Asthma   Current Medications:  Current Facility-Administered Medications  Medication Dose Route Frequency Provider Last Rate Last Dose  . acetaminophen (TYLENOL) tablet 650 mg  650 mg Oral Q6H PRN [name], MD   650 mg at 07/06/16 2120  . alum & mag hydroxide-simeth (MAALOX/MYLANTA) 200-200-20 MG/5ML suspension 30 mL  30 mL Oral Q4H PRN [name], MD      . amantadine (SYMMETREL) capsule 100 mg  100 mg Oral BID [name], MD   100 mg at 07/15/16 0826  . lisinopril (PRINIVIL,ZESTRIL) tablet 20 mg  20 mg Oral Daily [name], MD   20 mg at 07/15/16 0825   And  . hydrochlorothiazide (HYDRODIURIL) tablet 25 mg  25 mg Oral Daily [name], MD   25 mg at 07/15/16 0825  . insulin aspart (novoLOG) injection 0-15 Units  0-15 Units Subcutaneous TID WC [name], MD   8 Units at 07/15/16 [phone]  . insulin aspart (novoLOG) injection 0-5 Units  0-5 Units Subcutaneous QHS [name], MD   2 Units at 07/13/16 2143  . insulin aspart (novoLOG) injection 17 Units  17 Units Subcutaneous TID WC [name], MD   17 Units at 07/15/16 [phone]  . insulin glargine (LANTUS) injection 37 Units  37 Units Subcutaneous QHS [name], MD   37 Units at 07/14/16 2210  . magnesium hydroxide (MILK OF MAGNESIA) suspension 30 mL  30 mL Oral Daily PRN [name], MD      . metFORMIN (GLUCOPHAGE) tablet 1,000 mg  1,000 mg Oral BID WC [name], MD   1,000 mg at 07/15/16 0825  . montelukast (SINGULAIR) tablet 10 mg  10 mg Oral QHS [name], MD   10 mg at 07/14/16 2209  . nicotine  (NICODERM CQ - dosed in mg/24 hours) patch 21 mg  21 mg Transdermal Daily [name], MD   21 mg at 07/15/16 0826  . OLANZapine (ZYPREXA) tablet 15 mg  15 mg Oral QHS [name], MD   15 mg at 07/14/16 2210  . simvastatin (ZOCOR) tablet 40 mg  40 mg Oral QHS [name], MD   40 mg at 07/14/16 2210  . venlafaxine XR (EFFEXOR-XR) 24 hr capsule 300 mg  300 mg Oral Q breakfast [name], MD   300 mg at 07/15/16 6503   PTA Medications: Prescriptions Prior to Admission  Medication Sig Dispense Refill Last Dose  . ARIPiprazole (ABILIFY) 10 MG tablet Take 1 tablet (10 mg total) by mouth daily. For mood control 30 tablet 0   . benztropine (COGENTIN) 1 MG tablet Take 1 tablet (1 mg total) by mouth 2 (two) times daily. For prevention of drug induced tremors 60 tablet 0   . canagliflozin (INVOKANA) 300 MG TABS tablet Take 1 tablet (300 mg total) by mouth daily before breakfast. For diabetes management 30 tablet 0   . diclofenac sodium (VOLTAREN) 1 % GEL Apply 2 g topically 2 (two) times daily. For arthritic pain     . hydrOXYzine (ATARAX/VISTARIL) 25 MG tablet Take 1 tablet (25 mg total) by mouth 3 (three) times daily as needed for anxiety. 60 tablet 0   .  insulin aspart (NOVOLOG) 100 UNIT/ML injection Inject 15 Units into the skin 3 (three) times daily with meals. For diabetes management 10 mL 0   . insulin glargine (LANTUS) 100 UNIT/ML injection Inject 0.22 mLs (22 Units total) into the skin at bedtime. For diabetes management 10 mL 0   . lisinopril-hydrochlorothiazide (PRINZIDE,ZESTORETIC) 20-25 MG tablet Take 1 tablet by mouth daily. For high blood pressure 30 tablet 0   . metFORMIN (GLUCOPHAGE) 500 MG tablet Take 1 tablet (500 mg total) by mouth 2 (two) times daily with a meal. For diabetes management 60 tablet 0   . montelukast (SINGULAIR) 10 MG tablet Take 1 tablet (10 mg total) by mouth at bedtime. For Asthma 30 tablet 0   . nicotine (NICODERM CQ - DOSED IN MG/24  HOURS) 21 mg/24hr patch Place 1 patch (21 mg total) onto the skin daily. For smoking cessation 28 patch 0   . pregabalin (LYRICA) 75 MG capsule Take 1 capsule (75 mg total) by mouth 2 (two) times daily. For pain 16 capsule 0   . simvastatin (ZOCOR) 40 MG tablet Take 1 tablet (40 mg total) by mouth at bedtime. For high cholesterol 15 tablet 0   . traZODone (DESYREL) 100 MG tablet Take 1 tablet (100 mg total) by mouth at bedtime as needed for sleep. (Patient taking differently: Take 150 mg by mouth at bedtime as needed for sleep. ) 30 tablet 0   . venlafaxine XR (EFFEXOR-XR) 37.5 MG 24 hr capsule Take 5 capsules (187.5 mg total) by mouth daily with breakfast. For depression (Patient taking differently: Take 150 mg by mouth daily with breakfast. For depression) 150 capsule 0     Patient Stressors: Health problems Medication change or noncompliance  Patient Strengths: Capable of independent living Physical Health  Treatment Modalities: Medication Management, Group therapy, Case management,  1 to 1 session with clinician, Psychoeducation, Recreational therapy.   Physician Treatment Plan for Primary Diagnosis: Schizoaffective disorder (HCC) Long Term Goal(s): Improvement in symptoms so as ready for discharge Improvement in symptoms so as ready for discharge   Short Term Goals: Ability to identify changes in lifestyle to reduce recurrence of condition will improve Ability to verbalize feelings will improve Ability to disclose and discuss suicidal ideas Ability to demonstrate self-control will improve Ability to disclose and discuss suicidal ideas Ability to demonstrate self-control will improve Ability to identify and develop effective coping behaviors will improve  Medication Management: Evaluate patient's response, side effects, and tolerance of medication regimen.  Therapeutic Interventions: 1 to 1 sessions, Unit Group sessions and Medication administration.  Evaluation of Outcomes:  Progressing  Physician Treatment Plan for Secondary Diagnosis: Principal Problem:   Schizoaffective disorder (HCC) Active Problems:   Diabetes (HCC)   HTN (hypertension)   Tobacco use disorder   Dyslipidemia   Asthma  Long Term Goal(s): Improvement in symptoms so as ready for discharge Improvement in symptoms so as ready for discharge   Short Term Goals: Ability to identify changes in lifestyle to reduce recurrence of condition will improve Ability to verbalize feelings will improve Ability to disclose and discuss suicidal ideas Ability to demonstrate self-control will improve Ability to disclose and discuss suicidal ideas Ability to demonstrate self-control will improve Ability to identify and develop effective coping behaviors will improve     Medication Management: Evaluate patient's response, side effects, and tolerance of medication regimen.  Therapeutic Interventions: 1 to 1 sessions, Unit Group sessions and Medication administration.  Evaluation of Outcomes: Progressing   RN Treatment Plan for  Primary Diagnosis: Schizoaffective disorder (HCC) Long Term Goal(s): Knowledge of disease and therapeutic regimen to maintain health will improve  Short Term Goals: Ability to verbalize frustration and anger appropriately will improve, Ability to demonstrate self-control, Ability to disclose and discuss suicidal ideas, Ability to identify and develop effective coping behaviors will improve and Compliance with prescribed medications will improve  Medication Management: RN will administer medications as ordered by provider, will assess and evaluate patient's response and provide education to patient for prescribed medication. RN will report any adverse and/or side effects to prescribing provider.  Therapeutic Interventions: 1 on 1 counseling sessions, Psychoeducation, Medication administration, Evaluate responses to treatment, Monitor vital signs and CBGs as ordered, Perform/monitor  CIWA, COWS, AIMS and Fall Risk screenings as ordered, Perform wound care treatments as ordered.  Evaluation of Outcomes: Progressing   LCSW Treatment Plan for Primary Diagnosis: Schizoaffective disorder (HCC) Long Term Goal(s): Safe transition to appropriate next level of care at discharge, Engage patient in therapeutic group addressing interpersonal concerns.  Short Term Goals: Engage patient in aftercare planning with referrals and resources, Increase social support, Increase ability to appropriately verbalize feelings and Increase skills for wellness and recovery  Therapeutic Interventions: Assess for all discharge needs, 1 to 1 time with Social worker, Explore available resources and support systems, Assess for adequacy in community support network, Educate family and significant other(s) on suicide prevention, Complete Psychosocial Assessment, Interpersonal group therapy.  Evaluation of Outcomes: Progressing   Progress in Treatment: Attending groups: Yes. Participating in groups: Yes. Taking medication as prescribed: Yes. Toleration medication: Yes. Family/Significant other contact made: Yes, individual(s) contacted:  group home owner Patient understands diagnosis: Yes. Discussing patient identified problems/goals with staff: Yes. Medical problems stabilized or resolved: Yes. Denies suicidal/homicidal ideation: Yes. Issues/concerns per patient self-inventory: No. Other: n/a  New problem(s) identified: None identified at this time.   New Short Term/Long Term Goal(s): None identified at this time.   Discharge Plan or Barriers: Patient will discharge back to group home and follow-up with [facility] in [place].   Reason for Continuation of Hospitalization: Delusions  Depression Hallucinations  Estimated Length of Stay: 7 days.   Attendees: Patient: Tony Cunningham 07/15/2016 11:02 AM  Physician: Dr. Radene JourneyJayme Cloud, MD 07/15/2016 11:02 AM   Nursing: Hulan Amato, RN 07/15/2016 11:02 AM  RN Care Manager: 07/15/2016 11:02 AM  Social Worker: Hampton Abbot, MSW, LCSWA 07/15/2016 11:02 AM  Recreational Therapist: Jacquelynn Cree, LRT/CTRS 07/15/2016 11:02 AM    Scribe for Treatment Team: Lynden Oxford, LCSWA 07/15/2016 11:02 AM

## 2016-07-16 DIAGNOSIS — G2401 Drug induced subacute dyskinesia: Secondary | ICD-10-CM

## 2016-07-16 LAB — GLUCOSE, CAPILLARY
GLUCOSE-CAPILLARY: 184 mg/dL — AB (ref 65–99)
GLUCOSE-CAPILLARY: 128 mg/dL — AB (ref 65–99)
GLUCOSE-CAPILLARY: 100 mg/dL — AB (ref 65–99)
Glucose-Capillary: 128 mg/dL — ABNORMAL HIGH (ref 65–99)

## 2016-07-16 MED ORDER — INSULIN GLARGINE 100 UNIT/ML ~~LOC~~ SOLN: 37.0000 [IU] | Freq: Every day | SUBCUTANEOUS | 0 refills | Status: DC

## 2016-07-16 MED ORDER — AMANTADINE HCL 100 MG PO CAPS: 100.0000 mg | ORAL_CAPSULE | Freq: Two times a day (BID) | ORAL | 0 refills | Status: DC

## 2016-07-16 MED ORDER — VENLAFAXINE HCL ER 150 MG PO CP24: 300.0000 mg | ORAL_CAPSULE | Freq: Every day | ORAL | 0 refills | Status: DC

## 2016-07-16 MED ORDER — METFORMIN HCL 1000 MG PO TABS: 1000.0000 mg | ORAL_TABLET | Freq: Two times a day (BID) | ORAL | 0 refills | Status: DC

## 2016-07-16 MED ORDER — OLANZAPINE 20 MG PO TABS: 20.0000 mg | ORAL_TABLET | Freq: Every day | ORAL | 0 refills | Status: DC

## 2016-07-16 MED ORDER — TRAZODONE HCL 100 MG PO TABS: 100.0000 mg | ORAL_TABLET | Freq: Every day | ORAL | 0 refills | Status: DC

## 2016-07-16 MED ORDER — INSULIN ASPART 100 UNIT/ML ~~LOC~~ SOLN: 17.0000 [IU] | Freq: Three times a day (TID) | SUBCUTANEOUS | 0 refills | Status: DC

## 2016-07-16 NOTE — Progress Notes (Signed)
Inpatient Diabetes Program Recommendations  AACE/ADA: New Consensus Statement on Inpatient Glycemic Control (2015)  Target Ranges:  Prepandial:   less than 140 mg/dL      Peak postprandial:   less than 180 mg/dL (1-2 hours)      Critically ill patients:  140 - 180 mg/dL   Lab Results  Component Value Date   GLUCAP 184 (H) 07/16/2016   HGBA1C 12.8 (H) 07/07/2016    Review of Glycemic Control  Results for [name], PINK (MRN [id number]) as of 07/16/2016 11:48  Ref. Range 07/15/2016 11:21 07/15/2016 16:58 07/15/2016 20:50 07/16/2016 06:49 07/16/2016 11:38  Glucose-Capillary Latest Ref Range: 65 - 99 mg/dL 782 (H) 423 (H) 536 (H) 128 (H) 184 (H)    Current orders for Inpatient glycemic control: Lantus 37 units QHS, Novolog 17units TID with meals, Novolog 0-15 units TID with meals, Novolog 0-5 units QHS, Metformin 1000 mg BID  Agree with current medications for blood sugar management.   Susette Racer, RN, BA, MHA, CDE Diabetes Coordinator Inpatient Diabetes Program  [phone] (Team Pager) [phone] [facility] Office) 07/16/2016 11:50 AM

## 2016-07-16 NOTE — Progress Notes (Signed)
Recreation Therapy Notes  Date: 02.22.18 Time: 8:30 am Location: Craft Room  Group Topic: Leisure Education  Goal Area(s) Addresses:  Patient will identify things they are grateful for. Patient will identify how being grateful can influence decision making.  Behavioral Response: Did not attend  Intervention: Grateful Wheel  Activity: Patients were given an I am Grateful For worksheet and were instructed to write things they are grateful for under each category.  Education: LRT educated patients on leisure and why it is important  Education Outcome: Patient did not attend group.   Clinical Observations/Feedback: Patient did not attend group.  Jacquelynn Cree, LRT/CTRS 07/16/2016 10:15 AM
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
## 2016-07-16 NOTE — Progress Notes (Signed)
[facility] MD Progress Note  07/16/2016 9:52 AM Tony Cunningham  MRN:  [id number] Subjective:  41 y/o male came to our ER on 2/11 by EMS due to pain in back of head, stated he had trouble concentrating and remembering things for past 3-4. Pt stated he had been shaking for past 3 days and had not slept in 4 days. Patient reported auditory hallucinations telling him to walk into traffic.  He later reported having command hallucinations telling him to cut himself and bleed out.  Said he had prior suicidal attempts in the past which were commanded by hallucinations.   Patient lives in a group home [phone] [facility]) and has been there since December 2017. Prior to that he was living with his mother in [place].  Patient has peers support services through [facility] in [place]. He goes to [facility] in [place].  2/19 patient says he does not feel ready for discharge. He says "I know my body". Patient says he feels very anxious and has racing thoughts. He continues to be very vague in his answers. He is withdrawn and stays in his room a lot. He needs to be prompted in order to participate in programming. He denies auditory or visual hallucinations. He denies suicidality or homicidality.  2/20 patient was found in his room relating the morning. Lying in bed with the lights off. Once again his report of issues and problems is very limited. His answers are very vague. Patient is really unable to express how he feels. He says he is a little bit better than yesterday but at the same time tells me he doesn't feel his medications are working. He tells me he is very tired but then contradicted himself just minutes later. He was encouraged to attend group which he died. Social worker told me that he appears to be preoccupied as if he were having hallucinations. He however has been consistently denying having hallucinations. Some of the nurses also over the weekend felt  that he could have been interacting to internal stimuli. Yesterday dose of olanzapine was increased from 10 mg to 15.  2/21 really quite vague. He was found in his room lying in bed with the lights off. He tells me he feels tired. He did not sleep very well last night. His energy is low. His mood is a 4 out of 10 x 10 being the best. His insight is still very high. He tells me his thoughts continue to race but they are not as bad as they were before. He denies suicidality, homicidality or auditory or visual hallucinations. He denies any physical complaints.  2/22 patient during answer any questions today. He did have a smile on his face. He is unable to tell me much but denies having side effects from medications, hallucinations, suicidality or homicidality. He is open to the day of discharge back to the group home in the next day or 2.  Per nursing: D: Pt denies SI/HI/AVH. Pt is pleasant and cooperative, affect is flat and sad. Patient's thought are organized. Writer noted patient having involuntary movement like lip smacking and clinching his teeth together. Pt  appears less anxious and he is interacting with peers and staff appropriately.  A: Pt was offered support and encouragement. Pt was given scheduled medications. Pt was encouraged to attend groups. Q 15 minute checks were done for safety.  R:Pt did not attend group. Pt is complaint with  medication. Pt has no complaints.Pt receptive  to treatment and safety maintained on unit.  Principal Problem: Schizoaffective disorder (HCC) Diagnosis:   Patient Active Problem List   Diagnosis Date Noted  . Tardive dyskinesia [G24.01] 07/16/2016  . Tobacco use disorder [F17.200] 07/07/2016  . Dyslipidemia [E78.5] 07/07/2016  . Asthma [J45.909] 07/07/2016  . Schizoaffective disorder (HCC) [F25.9] 07/07/2016  . HTN (hypertension) [I10] 07/06/2016  . Diabetes (HCC) [E11.9] 12/25/2010   Total Time spent with patient: 30 minutes  Past Psychiatric  History: Patient has a long history of mental illness and has had multiple hospitalizations including multiple hospitalizations in [place]. Most of those records are unavailable to me. 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 Not sure whether he ever returns to an adequate baseline. He tells me that he has tried to cut himself and kill himself in the past. Denies any history of violence. He can't remember the names of any of his medicines. Outpatient medicines listed on admission included Abilify and Effexor.  Also hospitalized at [facility] [place].  Per review of records looks like he has been only diagnosed with major depressive disorder, he also has been diagnosed with polysubstance abuse that he has abused prescription medications.   Past Medical History: Has diabetes and is insulin-dependent. Also high blood pressure and possible COPD Past Medical History:  Diagnosis Date  . Anxiety   . Asthma   . Diabetes mellitus   . High blood pressure   . Sinus complaint    History reviewed. No pertinent surgical history.  Family History: History reviewed. No pertinent family history.  Family Psychiatric  History: unknown pt was uncooperative  Social History: Living in a group home. Has been there about 4-6 months he estimates. Says that he does not like it.  11 grade education.  Does not have a legal guardian. History  Alcohol Use No     History  Drug Use No    Social History   Social History  . Marital status: Single    Spouse name: [name]  . Number of children: [name]  . Years of education: [name]   Social History Main Topics  . Smoking status: Current Every Day Smoker    Packs/day: 0.50    Types: Cigarettes  . Smokeless tobacco: Never Used  . Alcohol use No  . Drug use: No  . Sexual activity: Not Currently   Other Topics Concern  . None   Social History Narrative  . None     Current Medications: Current  Facility-Administered Medications  Medication Dose Route Frequency Provider Last Rate Last Dose  . acetaminophen (TYLENOL) tablet 650 mg  650 mg Oral Q6H PRN [name], MD   650 mg at 07/06/16 2120  . alum & mag hydroxide-simeth (MAALOX/MYLANTA) 200-200-20 MG/5ML suspension 30 mL  30 mL Oral Q4H PRN [name], MD      . amantadine (SYMMETREL) capsule 100 mg  100 mg Oral BID [name], MD   100 mg at 07/16/16 7591  . lisinopril (PRINIVIL,ZESTRIL) tablet 20 mg  20 mg Oral Daily [name], MD   20 mg at 07/16/16 6384   And  . hydrochlorothiazide (HYDRODIURIL) tablet 25 mg  25 mg Oral Daily [name], MD   25 mg at 07/16/16 6659  . insulin aspart (novoLOG) injection 0-15 Units  0-15 Units Subcutaneous TID WC [name], MD   2 Units at 07/16/16 [id number]  . insulin aspart (novoLOG) injection 0-5 Units  0-5 Units Subcutaneous QHS [name], MD   2 Units at 07/13/16 2143  . insulin aspart (novoLOG) injection 17 Units  17 Units Subcutaneous TID WC [name], MD   17 Units at 07/16/16 0818  . insulin glargine (LANTUS) injection 37 Units  37 Units Subcutaneous QHS [name], MD   37 Units at 07/15/16 2154  . magnesium hydroxide (MILK OF MAGNESIA) suspension 30 mL  30 mL Oral Daily PRN [name], MD      . metFORMIN (GLUCOPHAGE) tablet 1,000 mg  1,000 mg Oral BID WC [name], MD   1,000 mg at 07/16/16 0814  . montelukast (SINGULAIR) tablet 10 mg  10 mg Oral QHS [name], MD   10 mg at 07/15/16 2153  . nicotine (NICODERM CQ - dosed in mg/24 hours) patch 21 mg  21 mg Transdermal Daily [name], MD   21 mg at 07/16/16 0814  . OLANZapine (ZYPREXA) tablet 20 mg  20 mg Oral QHS [name], MD   20 mg at 07/15/16 2153  . simvastatin (ZOCOR) tablet 40 mg  40 mg Oral QHS [name], MD   40 mg at 07/15/16 2153  . traZODone (DESYREL) tablet 100 mg  100 mg Oral QHS PRN [name],  MD   100 mg at 07/15/16 2306  . venlafaxine XR (EFFEXOR-XR) 24 hr capsule 300 mg  300 mg Oral Q breakfast [name], MD   300 mg at 07/16/16 0813    Lab Results:  Results for orders placed or performed during the hospital encounter of 07/06/16 (from the past 48 hour(s))  Glucose, capillary     Status: None   Collection Time: 07/14/16 11:24 AM  Result Value Ref Range   Glucose-Capillary 77 65 - 99 mg/dL  Glucose, capillary     Status: Abnormal   Collection Time: 07/14/16  4:33 PM  Result Value Ref Range   Glucose-Capillary 322 (H) 65 - 99 mg/dL   Comment 1 Notify RN   Glucose, capillary     Status: Abnormal   Collection Time: 07/14/16  8:43 PM  Result Value Ref Range   Glucose-Capillary 129 (H) 65 - 99 mg/dL  Glucose, capillary     Status: Abnormal   Collection Time: 07/15/16  6:45 AM  Result Value Ref Range   Glucose-Capillary 267 (H) 65 - 99 mg/dL  Glucose, capillary     Status: Abnormal   Collection Time: 07/15/16 11:21 AM  Result Value Ref Range   Glucose-Capillary 181 (H) 65 - 99 mg/dL  Glucose, capillary     Status: Abnormal   Collection Time: 07/15/16  4:58 PM  Result Value Ref Range   Glucose-Capillary 135 (H) 65 - 99 mg/dL  Glucose, capillary     Status: Abnormal   Collection Time: 07/15/16  8:50 PM  Result Value Ref Range   Glucose-Capillary 141 (H) 65 - 99 mg/dL   Comment 1 Notify RN     Blood Alcohol level:  Lab Results  Component Value Date   ETH <5 07/05/2016    Metabolic Disorder Labs: Lab Results  Component Value Date   HGBA1C 12.8 (H) 07/07/2016   MPG 321 07/07/2016   MPG 240 02/09/2016   Lab Results  Component Value Date   PROLACTIN 3.4 (L) 07/07/2016   PROLACTIN 3.0 (L) 02/08/2016   Lab Results  Component Value Date   CHOL 120 07/07/2016   TRIG 153 (H) 07/07/2016   HDL 33 (L) 07/07/2016  CHOLHDL 3.6 07/07/2016   VLDL 31 07/07/2016   LDLCALC 56 07/07/2016   LDLCALC 99 02/08/2016    Physical Findings: AIMS: Facial  and Oral Movements Muscles of Facial Expression: Mild Lips and Perioral Area: Moderate Jaw: Mild Tongue: Minimal,Extremity Movements Upper (arms, wrists, hands, fingers): None, normal Lower (legs, knees, ankles, toes): None, normal, Trunk Movements Neck, shoulders, hips: None, normal, Overall Severity Severity of abnormal movements (highest score from questions above): Minimal Incapacitation due to abnormal movements: None, normal Patient's awareness of abnormal movements (rate only patient's report): No Awareness, Dental Status Current problems with teeth and/or dentures?: No Does patient usually wear dentures?: No (Patient on Cogentin)  CIWA:    COWS:     Musculoskeletal: Strength & Muscle Tone: within normal limits Gait & Station: shuffle Patient leans: [name]  Psychiatric Specialty Exam: Physical Exam  Constitutional: He is oriented to person, place, and time. He appears well-developed and well-nourished.  HENT:  Head: Normocephalic and atraumatic.  Eyes: Conjunctivae and EOM are normal.  Neck: Normal range of motion.  Respiratory: Effort normal.  Musculoskeletal: Normal range of motion.  Neurological: He is alert and oriented to person, place, and time.    Review of Systems  Constitutional: Negative.   HENT: Negative.   Eyes: Negative.   Respiratory: Negative.   Cardiovascular: Negative.   Gastrointestinal: Negative.   Genitourinary: Negative.   Skin: Negative.   Neurological: Negative.   Endo/Heme/Allergies: Negative.   Psychiatric/Behavioral: Positive for depression. Negative for hallucinations, memory loss, substance abuse and suicidal ideas. The patient is nervous/anxious. The patient does not have insomnia.     Blood pressure 104/73, pulse 68, temperature 98.3 F (36.8 C), resp. rate 18, height 6' (1.829 m), weight 74.8 kg (165 lb), SpO2 100 %.Body mass index is 22.38 kg/m.  General Appearance: Fairly Groomed  Eye Contact:  Minimal  Speech:  Slow  Volume:   Decreased  Mood:  Dysphoric  Affect:  Constricted  Thought Process:  Linear and Descriptions of Associations: Intact  Orientation:  Full (Time, Place, and Person)  Thought Content:  Hallucinations: None  Suicidal Thoughts:  No  Homicidal Thoughts:  No  Memory:  Immediate;   Poor Recent;   Poor Remote;   Poor  Judgement:  Impaired  Insight:  Lacking  Psychomotor Activity:  Decreased  Concentration:  Concentration: Poor and Attention Span: Poor  Recall:  Poor  Fund of Knowledge:  Poor  Language:  Poor  Akathisia:  No  Handed:    AIMS (if indicated):     Assets:  Architect Housing  ADL's:  Intact  Cognition:  Impaired,  Mild  Sleep:  Number of Hours: 6.3     Treatment Plan Summary:  Patient tells me he is still not ready for discharge. He complains of having severe anxiety and racing thoughts.  41 y/o male with depression, SI and hallucinations.  Multiple prior hospitalization and prior suicidal attempts.  Likely Schizoaffective diosorder.  Not much different since admission, answers are still very basic and sure. The patient has not been participating in programming and has been in his room most of the time. Appears quite anergic.  Per review of records patient has history of abusing opiates, Lyrica, Remeron and Flexeril. Here he has been frequently asking for Ativan or clonazepam.  Depressive symptoms: continue Effexor XR Which has been titrated up to 300 mg a day  Psychosis: He appears to be slowly improving with olanzapine. Continue olanzapine 20 mg at bedtime which was just  increased yesterday  Akathisia: continue amantadine 100 mg po bid  Insomnia: will target insomnia with zyprexa--- slept 6 hours last night  History of abusing opiates, Flexeril, Lyrica and mirtazapine. We'll benefit from continuing outpatient substance abuse services. We'll not discharge him with controlled substances.  Keeps asking nurses for  klonopin   For diabetes: continue  NovoLog  17 units 3 times a day with meals. Lantus  37 units at bedtime. Patient has orders for supplemental insulin.  Also continue metformin 500 mg twice a day.   Patient is currently being followed by hospitalist service. Recommendations received today from diabetes coordinator  Hypertension: Continue lisinopril 20 mg a day and hydrochlorothiazide 25 mg a day  Dyslipidemia continue Zocor 40 mg at bedtime  Asthma continue Singulair 10 mg at bedtime  Tobacco use disorder continue nicotine patch 21 mg a day  Unsteady gait: No evidence of unsteady gait today. Resolved  Labs: hemoglobin A1c, lipid panel, TSH all completed.  Diet carb modified and low sodium  Vital signs daily  Precautions every 15 minute checks  Hospitalization status now voluntary  Disposition: Once stable the patient will be discharged back to his group home.   Follow up: continue to f/u with [facility] in [place].  Continue with peers support through [facility] in [place].  Peers support is [name]'s phone number is [phone]. Also attending day program.  Possible discharge in 24 h.   [name], MD 07/16/2016, 9:52 AM

## 2016-07-16 NOTE — Progress Notes (Signed)
Affect flat.  Denies SI/HI/AVH.  Pleasant and cooperative.  Medication and group compliant.   Visible in the milieu.  Talks when engaged.  Smiles at time.  Verbalizes that he is ready to go home.  Support and encouragement offered.  Safety maintained.

## 2016-07-16 NOTE — [facility] Group Notes (Signed)
[facility] LCSW Group Therapy  07/16/2016 2:44 PM  Type of Therapy:  Group Therapy  Participation Level:  Patient did not attend group. CSW invited patient to group.   Summary of Progress/Problems: Balance in life: Patients will discuss the concept of balance and how it looks and feels to be unbalanced. Pt will identify areas in their life that is unbalanced and ways to become more balanced. They discussed what aspects in their lives has influenced their self care. Patients also discussed self care in the areas of self regulation/control, hygiene/appearance, sleep/relaxation, healthy leisure, healthy eating habits, exercise, inner peace/spirituality, self improvement, sobriety, and health management. They were challenged to identify changes that are needed in order to improve self care.   Darrien Belter G. Garnette Czech MSW, LCSWA 07/16/2016, 2:45 PM

## 2016-07-16 NOTE — [facility] Group Notes (Signed)
[facility] LCSW Group Therapy Note  Type of Therapy and Topic:  Group Therapy:  Goals Group: SMART Goals  Participation Level:  Patient did not attend group. CSW invited patient to group.   Description of Group:   The purpose of a daily goals group is to assist and guide patients in setting recovery/wellness-related goals.  The objective is to set goals as they relate to the crisis in which they were admitted. Patients will be using SMART goal modalities to set measurable goals.  Characteristics of realistic goals will be discussed and patients will be assisted in setting and processing how one will reach their goal. Facilitator will also assist patients in applying interventions and coping skills learned in psycho-education groups to the SMART goal and process how one will achieve defined goal.  Therapeutic Goals: -Patients will develop and document one goal related to or their crisis in which brought them into treatment. -Patients will be guided by LCSW using SMART goal setting modality in how to set a measurable, attainable, realistic and time sensitive goal.  -Patients will process barriers in reaching goal. -Patients will process interventions in how to overcome and successful in reaching goal.   Summary of Patient Progress:  Patient Goal: Patient did not attend group. CSW invited patient to group.    Therapeutic Modalities:   Motivational Interviewing Engineer, manufacturing systems Therapy Crisis Intervention Model SMART goals setting  Tony Cunningham MSW, [facility] 07/16/2016 10:57 AM

## 2016-07-16 NOTE — Plan of Care (Signed)
Problem: Education: Goal: Verbalization of understanding the information provided will improve Outcome: Progressing Patient verbalized information on  Medication and plan of car.

## 2016-07-16 NOTE — Progress Notes (Signed)
D: Pt denies SI/HI/AVH. Pt is pleasant and cooperative, affect is flat and sad. Patient's thought are organized. Writer noted patient having involuntary movement like lip smacking and clinching his teeth together. Pt  appears less anxious and he is interacting with peers and staff appropriately.  A: Pt was offered support and encouragement. Pt was given scheduled medications. Pt was encouraged to attend groups. Q 15 minute checks were done for safety.  R:Pt did not attend group. Pt is complaint with  medication. Pt has no complaints.Pt receptive to treatment and safety maintained on unit.

## 2016-07-16 NOTE — [place] FL2 (Signed)
[place] MEDICAID FL2 LEVEL OF CARE SCREENING TOOL     IDENTIFICATION  Patient Name: Tony Cunningham Birthdate: 28-Oct-1975 Sex: male Admission Date (Current Location): 07/06/2016  [place] and [place] Number:  [address] [id number] T Facility and Address:  [facility], [address] [place], [place] [address]      Provider Number: [id number]  Attending Physician Name and Address:  Barnabas Harries*  Relative Name and Phone Number:  Durenda Age [phone]    Current Level of Care: Hospital Recommended Level of Care: Family Care Home (Group home) Prior Approval Number:    Date Approved/Denied:   PASRR Number:    Discharge Plan: Other (Comment) (group home)    Current Diagnoses: Patient Active Problem List   Diagnosis Date Noted  . Tardive dyskinesia 07/16/2016  . Tobacco use disorder 07/07/2016  . Dyslipidemia 07/07/2016  . Asthma 07/07/2016  . Schizoaffective disorder (HCC) 07/07/2016  . HTN (hypertension) 07/06/2016  . Diabetes (HCC) 12/25/2010    Orientation RESPIRATION BLADDER Height & Weight     Self, Time, Situation, Place (x4)  Normal Continent Weight: 165 lb (74.8 kg) Height:  6' (182.9 cm)  BEHAVIORAL SYMPTOMS/MOOD NEUROLOGICAL BOWEL NUTRITION STATUS   ([name])   Continent  ([name])  AMBULATORY STATUS COMMUNICATION OF NEEDS Skin   Independent Verbally Normal                       Personal Care Assistance Level of Assistance   ([name])           Functional Limitations Info   ([name])          SPECIAL CARE FACTORS FREQUENCY   ([name])                    Contractures Contractures Info: Not present    Additional Factors Info   (Full code status)               Current Medications (07/16/2016):  This is the current hospital active medication list Current Facility-Administered Medications  Medication Dose Route Frequency Provider Last Rate Last Dose  . acetaminophen (TYLENOL) tablet 650 mg  650 mg Oral Q6H  PRN [name], MD   650 mg at 07/06/16 2120  . alum & mag hydroxide-simeth (MAALOX/MYLANTA) 200-200-20 MG/5ML suspension 30 mL  30 mL Oral Q4H PRN [name], MD      . amantadine (SYMMETREL) capsule 100 mg  100 mg Oral BID [name], MD   100 mg at 07/16/16 3419  . lisinopril (PRINIVIL,ZESTRIL) tablet 20 mg  20 mg Oral Daily [name], MD   20 mg at 07/16/16 3790   And  . hydrochlorothiazide (HYDRODIURIL) tablet 25 mg  25 mg Oral Daily [name], MD   25 mg at 07/16/16 2409  . insulin aspart (novoLOG) injection 0-15 Units  0-15 Units Subcutaneous TID WC [name], MD   3 Units at 07/16/16 1140  . insulin aspart (novoLOG) injection 0-5 Units  0-5 Units Subcutaneous QHS [name], MD   2 Units at 07/13/16 2143  . insulin aspart (novoLOG) injection 17 Units  17 Units Subcutaneous TID WC [name], MD   17 Units at 07/16/16 1141  . insulin glargine (LANTUS) injection 37 Units  37 Units Subcutaneous QHS [name], MD   37 Units at 07/15/16 2154  . magnesium hydroxide (MILK OF MAGNESIA) suspension 30 mL  30 mL Oral Daily PRN [name], MD      .  metFORMIN (GLUCOPHAGE) tablet 1,000 mg  1,000 mg Oral BID WC [name], MD   1,000 mg at 07/16/16 7829  . montelukast (SINGULAIR) tablet 10 mg  10 mg Oral QHS [name], MD   10 mg at 07/15/16 2153  . nicotine (NICODERM CQ - dosed in mg/24 hours) patch 21 mg  21 mg Transdermal Daily [name], MD   21 mg at 07/16/16 0814  . OLANZapine (ZYPREXA) tablet 20 mg  20 mg Oral QHS [name], MD   20 mg at 07/15/16 2153  . simvastatin (ZOCOR) tablet 40 mg  40 mg Oral QHS [name], MD   40 mg at 07/15/16 2153  . traZODone (DESYREL) tablet 100 mg  100 mg Oral QHS PRN [name], MD   100 mg at 07/15/16 2306  . venlafaxine XR (EFFEXOR-XR) 24 hr capsule 300 mg  300 mg Oral Q breakfast [name], MD   300 mg at  07/16/16 5621     Discharge Medications: Please see discharge summary for a list of discharge medications.  Relevant Imaging Results:  Relevant Lab Results:   Additional Information    Lynden Oxford, LCSWA

## 2016-07-16 NOTE — [facility] Suicide Risk Assessment (Signed)
[facility] Discharge Suicide Risk Assessment   Principal Problem: Schizoaffective disorder [facility]) Discharge Diagnoses:  Patient Active Problem List   Diagnosis Date Noted  . Tardive dyskinesia [G24.01] 07/16/2016  . Tobacco use disorder [F17.200] 07/07/2016  . Dyslipidemia [E78.5] 07/07/2016  . Asthma [J45.909] 07/07/2016  . Schizoaffective disorder (HCC) [F25.9] 07/07/2016  . HTN (hypertension) [I10] 07/06/2016  . Diabetes (HCC) [E11.9] 12/25/2010      Psychiatric Specialty Exam: ROS  Blood pressure 98/67, pulse 74, temperature 98.2 F (36.8 C), temperature source Oral, resp. rate 18, height 6' (1.829 m), weight 74.8 kg (165 lb), SpO2 99 %.Body mass index is 22.38 kg/m.                                                       Mental Status Per Nursing Assessment::   On Admission:     Demographic Factors:  Male  Loss Factors: Decrease in vocational status  Historical Factors: Impulsivity  Risk Reduction Factors:   Living with another person, especially a relative and Positive social support  No access to guns  Continued Clinical Symptoms:  Previous Psychiatric Diagnoses and Treatments Medical Diagnoses and Treatments/Surgeries  Cognitive Features That Contribute To Risk:  Loss of executive function    Suicide Risk:  Minimal: No identifiable suicidal ideation.  Patients presenting with no risk factors but with morbid ruminations; may be classified as minimal risk based on the severity of the depressive symptoms  Follow-up Information    [facility]. Go on 07/22/2016.   Why:  Your appointment with [facility] is Wednesday, Feb. 28th @ 11:40 AM. Please have your discharge paperwork at this appointment for medication management and adjustments.  Contact information: [address] [place] [address] [phone]           [name], MD 07/17/2016, 9:18 AM

## 2016-07-17 DIAGNOSIS — F1910 Other psychoactive substance abuse, uncomplicated: Secondary | ICD-10-CM

## 2016-07-17 LAB — GLUCOSE, CAPILLARY: Glucose-Capillary: 115 mg/dL — ABNORMAL HIGH (ref 65–99)

## 2016-07-17 NOTE — Progress Notes (Signed)
  [facility] Adult Case Management Discharge Plan :  Will you be returning to the same living situation after discharge:  Yes,  back to group home. At discharge, do you have transportation home?: Yes,  group home will pick pt up. Do you have the ability to pay for your medications: Yes,  Medicaid/Medicare  Release of information consent forms completed and in the chart;  Patient's signature needed at discharge.  Patient to Follow up at: Follow-up Information    [facility]. Go on 07/22/2016.   Why:  Your appointment with [facility] is Wednesday, Feb. 28th @ 11:40 AM. Please have your discharge paperwork at this appointment for medication management and adjustments.  Contact information: [address] [place] [address] [phone]           Next level of care provider has access to [facility] Link:no  Safety Planning and Suicide Prevention discussed: Yes,  group home director and patient.  Have you used any form of tobacco in the last 30 days? (Cigarettes, Smokeless Tobacco, Cigars, and/or Pipes): Yes  Has patient been referred to the Quitline?: Patient refused referral  Patient has been referred for addiction treatment: [name]  Lynden Oxford, MSW, LCSW-A 07/17/2016, 9:10 AM

## 2016-07-17 NOTE — Progress Notes (Signed)
Recreation Therapy Notes  Date: 02.23.18 Time: 9:30 am Location: Craft Room  Group Topic: Coping Skills  Goal Area(s) Addresses:  Patient will participate in a healthy coping skill. Patient will verbalize benefit of using art as a coping skill.  Behavioral Response: Attentive  Intervention: Coloring  Activity: Patients were given coloring sheets to color and were instructed to think about the emotions they were feeling as well as what their minds were focused on.  Education: LRT educated patients on healthy coping skills.  Education Outcome: In group clarification offered   Clinical Observations/Feedback: Patient colored coloring sheet. Patient did not contribute to group discussion.  Jacquelynn Cree, LRT/CTRS 07/17/2016 10:30 AM

## 2016-07-17 NOTE — Progress Notes (Addendum)
D: Pt denies SI/HI/AVH. Pt is pleasant and cooperative, affect is flat and sad. Patient's thought are organized. Writer noted patient having involuntary movement ; lip smacking during shift. Pt  appears less anxious and he is interacting with peers and staff appropriately.  A: Pt was offered support and encouragement. Pt was given scheduled medications. Pt was encouraged to attend groups. Q 15 minute checks were done for safety.  R:Pt did not attend group. Pt is complaint with  medication. Pt has no complaints.Pt receptive to treatment and safety maintained on unit.

## 2016-07-17 NOTE — [facility] Group Notes (Signed)
[facility] Group Notes:  (Nursing/MHT/Case Management/Adjunct)  Date:  07/17/2016  Time:  12:15 AM  Type of Therapy:  Psychoeducational Skills  Participation Level:  Active  Participation Quality:  Appropriate  Affect:  Appropriate  Cognitive:  Appropriate  Insight:  Good  Engagement in Group:  Engaged  Modes of Intervention:  Activity  Summary of Progress/Problems:  Tony Cunningham 07/17/2016, 12:15 AM

## 2016-07-17 NOTE — Tx Team (Signed)
Interdisciplinary Treatment and Diagnostic Plan Update  07/17/2016  Time of Session: 10:30am [name] MRN: [id number]  Principal Diagnosis: Schizoaffective disorder [facility])  Secondary Diagnoses: Principal Problem:   Schizoaffective disorder (HCC) Active Problems:   Diabetes (HCC)   HTN (hypertension)   Tobacco use disorder   Dyslipidemia   Asthma   Tardive dyskinesia   Polysubstance abuse   Current Medications:  Current Facility-Administered Medications  Medication Dose Route Frequency Provider Last Rate Last Dose  . acetaminophen (TYLENOL) tablet 650 mg  650 mg Oral Q6H PRN [name], MD   650 mg at 07/06/16 2120  . alum & mag hydroxide-simeth (MAALOX/MYLANTA) 200-200-20 MG/5ML suspension 30 mL  30 mL Oral Q4H PRN [name], MD      . amantadine (SYMMETREL) capsule 100 mg  100 mg Oral BID [name], MD   100 mg at 07/17/16 0801  . lisinopril (PRINIVIL,ZESTRIL) tablet 20 mg  20 mg Oral Daily [name], MD   20 mg at 07/16/16 4098   And  . hydrochlorothiazide (HYDRODIURIL) tablet 25 mg  25 mg Oral Daily [name], MD   25 mg at 07/16/16 1191  . insulin aspart (novoLOG) injection 0-15 Units  0-15 Units Subcutaneous TID WC [name], MD   2 Units at 07/16/16 1625  . insulin aspart (novoLOG) injection 0-5 Units  0-5 Units Subcutaneous QHS [name], MD   2 Units at 07/13/16 2143  . insulin aspart (novoLOG) injection 17 Units  17 Units Subcutaneous TID WC [name], MD   17 Units at 07/17/16 0802  . insulin glargine (LANTUS) injection 37 Units  37 Units Subcutaneous QHS [name], MD   37 Units at 07/16/16 2144  . magnesium hydroxide (MILK OF MAGNESIA) suspension 30 mL  30 mL Oral Daily PRN [name], MD      . metFORMIN (GLUCOPHAGE) tablet 1,000 mg  1,000 mg Oral BID WC [name], MD   1,000 mg at 07/17/16 0801  . montelukast (SINGULAIR) tablet 10 mg  10 mg Oral QHS [name], MD   10 mg at 07/16/16 2143  . nicotine (NICODERM CQ - dosed in mg/24 hours) patch 21 mg  21 mg Transdermal Daily [name], MD   21 mg at 07/17/16 0801  . OLANZapine (ZYPREXA) tablet 20 mg  20 mg Oral QHS [name], MD   20 mg at 07/16/16 2143  . simvastatin (ZOCOR) tablet 40 mg  40 mg Oral QHS [name], MD   40 mg at 07/16/16 2143  . traZODone (DESYREL) tablet 100 mg  100 mg Oral QHS PRN [name], MD   100 mg at 07/16/16 2148  . venlafaxine XR (EFFEXOR-XR) 24 hr capsule 300 mg  300 mg Oral Q breakfast [name], MD   300 mg at 07/17/16 0801   PTA Medications: Prescriptions Prior to Admission  Medication Sig Dispense Refill Last Dose  . ARIPiprazole (ABILIFY) 10 MG tablet Take 1 tablet (10 mg total) by mouth daily. For mood control 30 tablet 0   . benztropine (COGENTIN) 1 MG tablet Take 1 tablet (1 mg total) by mouth 2 (two) times daily. For prevention of drug induced tremors 60 tablet 0   . canagliflozin (INVOKANA) 300 MG TABS tablet Take 1 tablet (300 mg total) by mouth daily before breakfast. For diabetes management 30 tablet 0   . diclofenac sodium (VOLTAREN) 1 % GEL Apply 2 g topically 2 (two) times daily.  For arthritic pain     . hydrOXYzine (ATARAX/VISTARIL) 25 MG tablet Take 1 tablet (25 mg total) by mouth 3 (three) times daily as needed for anxiety. 60 tablet 0   . insulin aspart (NOVOLOG) 100 UNIT/ML injection Inject 15 Units into the skin 3 (three) times daily with meals. For diabetes management 10 mL 0   . insulin glargine (LANTUS) 100 UNIT/ML injection Inject 0.22 mLs (22 Units total) into the skin at bedtime. For diabetes management 10 mL 0   . lisinopril-hydrochlorothiazide (PRINZIDE,ZESTORETIC) 20-25 MG tablet Take 1 tablet by mouth daily. For high blood pressure 30 tablet 0   . metFORMIN (GLUCOPHAGE) 500 MG tablet Take 1 tablet (500 mg total) by mouth 2 (two) times daily with a meal. For diabetes management 60 tablet 0   .  montelukast (SINGULAIR) 10 MG tablet Take 1 tablet (10 mg total) by mouth at bedtime. For Asthma 30 tablet 0   . nicotine (NICODERM CQ - DOSED IN MG/24 HOURS) 21 mg/24hr patch Place 1 patch (21 mg total) onto the skin daily. For smoking cessation 28 patch 0   . pregabalin (LYRICA) 75 MG capsule Take 1 capsule (75 mg total) by mouth 2 (two) times daily. For pain 16 capsule 0   . simvastatin (ZOCOR) 40 MG tablet Take 1 tablet (40 mg total) by mouth at bedtime. For high cholesterol 15 tablet 0   . traZODone (DESYREL) 100 MG tablet Take 1 tablet (100 mg total) by mouth at bedtime as needed for sleep. (Patient taking differently: Take 150 mg by mouth at bedtime as needed for sleep. ) 30 tablet 0   . venlafaxine XR (EFFEXOR-XR) 37.5 MG 24 hr capsule Take 5 capsules (187.5 mg total) by mouth daily with breakfast. For depression (Patient taking differently: Take 150 mg by mouth daily with breakfast. For depression) 150 capsule 0     Patient Stressors: Health problems Medication change or noncompliance  Patient Strengths: Capable of independent living Physical Health  Treatment Modalities: Medication Management, Group therapy, Case management,  1 to 1 session with clinician, Psychoeducation, Recreational therapy.   Physician Treatment Plan for Primary Diagnosis: Schizoaffective disorder (HCC) Long Term Goal(s): Improvement in symptoms so as ready for discharge Improvement in symptoms so as ready for discharge   Short Term Goals: Ability to identify changes in lifestyle to reduce recurrence of condition will improve Ability to verbalize feelings will improve Ability to disclose and discuss suicidal ideas Ability to demonstrate self-control will improve Ability to disclose and discuss suicidal ideas Ability to demonstrate self-control will improve Ability to identify and develop effective coping behaviors will improve  Medication Management: Evaluate patient's response, side effects, and tolerance  of medication regimen.  Therapeutic Interventions: 1 to 1 sessions, Unit Group sessions and Medication administration.  Evaluation of Outcomes: Adequate for discharge.  Physician Treatment Plan for Secondary Diagnosis: Principal Problem:   Schizoaffective disorder (HCC) Active Problems:   Diabetes (HCC)   HTN (hypertension)   Tobacco use disorder   Dyslipidemia   Asthma   Tardive dyskinesia   Polysubstance abuse  Long Term Goal(s): Improvement in symptoms so as ready for discharge Improvement in symptoms so as ready for discharge   Short Term Goals: Ability to identify changes in lifestyle to reduce recurrence of condition will improve Ability to verbalize feelings will improve Ability to disclose and discuss suicidal ideas Ability to demonstrate self-control will improve Ability to disclose and discuss suicidal ideas Ability to demonstrate self-control will improve Ability to identify and develop  effective coping behaviors will improve     Medication Management: Evaluate patient's response, side effects, and tolerance of medication regimen.  Therapeutic Interventions: 1 to 1 sessions, Unit Group sessions and Medication administration.  Evaluation of Outcomes: Adequate for discharge.  RN Treatment Plan for Primary Diagnosis: Schizoaffective disorder (HCC) Long Term Goal(s): Knowledge of disease and therapeutic regimen to maintain health will improve  Short Term Goals: Ability to verbalize frustration and anger appropriately will improve, Ability to demonstrate self-control, Ability to disclose and discuss suicidal ideas, Ability to identify and develop effective coping behaviors will improve and Compliance with prescribed medications will improve  Medication Management: RN will administer medications as ordered by provider, will assess and evaluate patient's response and provide education to patient for prescribed medication. RN will report any adverse and/or side effects to  prescribing provider.  Therapeutic Interventions: 1 on 1 counseling sessions, Psychoeducation, Medication administration, Evaluate responses to treatment, Monitor vital signs and CBGs as ordered, Perform/monitor CIWA, COWS, AIMS and Fall Risk screenings as ordered, Perform wound care treatments as ordered.  Evaluation of Outcomes: Adequate for discharge.   LCSW Treatment Plan for Primary Diagnosis: Schizoaffective disorder [facility]) Long Term Goal(s): Safe transition to appropriate next level of care at discharge, Engage patient in therapeutic group addressing interpersonal concerns.  Short Term Goals: Engage patient in aftercare planning with referrals and resources, Increase social support, Increase ability to appropriately verbalize feelings and Increase skills for wellness and recovery  Therapeutic Interventions: Assess for all discharge needs, 1 to 1 time with Social worker, Explore available resources and support systems, Assess for adequacy in community support network, Educate family and significant other(s) on suicide prevention, Complete Psychosocial Assessment, Interpersonal group therapy.  Evaluation of Outcomes: Adequate for discharge.   Progress in Treatment: Attending groups: Yes. Participating in groups: Yes. Taking medication as prescribed: Yes. Toleration medication: Yes. Family/Significant other contact made: Yes, individual(s) contacted:  group home owner Patient understands diagnosis: Yes. Discussing patient identified problems/goals with staff: Yes. Medical problems stabilized or resolved: Yes. Denies suicidal/homicidal ideation: Yes. Issues/concerns per patient self-inventory: No. Other: n/a  New problem(s) identified: None identified at this time.   New Short Term/Long Term Goal(s): None identified at this time.   Discharge Plan or Barriers: Patient will discharge back to group home and follow-up with [facility] in [place].   Reason for  Continuation of Hospitalization: Delusions  Depression Hallucinations  Estimated Length of Stay: D/C 07/17/2016  Attendees: Patient: Tony Cunningham 07/17/2016 11:07 AM  Physician: Dr. Radene JourneyJayme Cloud, MD 07/17/2016 11:07 AM  Nursing: Leonia Reader, BSN, RN 07/17/2016 11:07 AM  RN Care Manager: 07/17/2016 11:07 AM  Social Worker: Hampton Abbot, MSW, LCSWA 07/17/2016 11:07 AM  Recreational Therapist: Jacquelynn Cree, LRT/CTRS 07/17/2016 11:07 AM    Scribe for Treatment Team: Lynden Oxford, LCSWA 07/17/2016 11:07 AM

## 2016-07-17 NOTE — Progress Notes (Signed)
Recreation Therapy Notes  INPATIENT RECREATION TR PLAN  Patient Details Name: Tony Cunningham MRN: [id number] DOB: 27-Sep-1975 Today's Date: 07/17/2016  Rec Therapy Plan Is patient appropriate for Therapeutic Recreation?: Yes Treatment times per week: At least once a week TR Treatment/Interventions: 1:1 session, Group participation (Comment) (Appropriate participation in daily recreational therapy tx)  Discharge Criteria Pt will be discharged from therapy if:: Treatment goals are met, Discharged Treatment plan/goals/alternatives discussed and agreed upon by:: Patient/family  Discharge Summary Short term goals set: See Care Plan Short term goals met: Complete Progress toward goals comments: One-to-one attended Which groups?: Goal setting, Coping skills, Other (Comment) (Self-expression) One-to-one attended: Self-esteem, stress management Reason goals not met: [name] Therapeutic equipment acquired: None Reason patient discharged from therapy: Discharge from hospital Pt/family agrees with progress & goals achieved: Yes Date patient discharged from therapy: 07/17/16   Leonette [facility]arch, LRT/CTRS 07/17/2016, 2:30 PM

## 2016-07-17 NOTE — Progress Notes (Signed)
Patient verbalizes that he is nervous about leaving.  States that he feels safer here.  Asked if he trusted the staff at the group home and if he wanted to return.  Verbalized that he did.  Further stated that if he starts to hear voices or feel unsafe to let group home staff know just like he does here.  Verbalized that he would.   Discharge instructions reviewed with patient and group home staff member, both verbalized understanding.  Informed group home staff of patient concerns and staff member reassured patient that they would look out for him and keep him safe.  FL2, prescriptions given and copy of discharge instructions, transition record and discharge suicide risk assessment given to group home staff member.  Personal belongings returned.  Care relinquished to group home staff.  This Clinical research associate escorted both to main entrance.

## 2016-07-24 ENCOUNTER — Encounter (HOSPITAL_COMMUNITY): Payer: Self-pay | Admitting: Emergency Medicine

## 2016-07-24 ENCOUNTER — Emergency Department (HOSPITAL_COMMUNITY)
Admission: EM | Admit: 2016-07-24 | Discharge: 2016-07-25 | Disposition: A | Discharge status: Other Acute Inpatient Hospital | Payer: Medicare Other | Attending: Emergency Medicine | Admitting: Emergency Medicine

## 2016-07-24 DIAGNOSIS — F17210 Nicotine dependence, cigarettes, uncomplicated: Secondary | ICD-10-CM | POA: Diagnosis not present

## 2016-07-24 DIAGNOSIS — Z794 Long term (current) use of insulin: Secondary | ICD-10-CM | POA: Insufficient documentation

## 2016-07-24 DIAGNOSIS — R440 Auditory hallucinations: Secondary | ICD-10-CM

## 2016-07-24 DIAGNOSIS — G2401 Drug induced subacute dyskinesia: Secondary | ICD-10-CM | POA: Diagnosis not present

## 2016-07-24 DIAGNOSIS — J45909 Unspecified asthma, uncomplicated: Secondary | ICD-10-CM | POA: Diagnosis not present

## 2016-07-24 DIAGNOSIS — F251 Schizoaffective disorder, depressive type: Secondary | ICD-10-CM | POA: Diagnosis not present

## 2016-07-24 DIAGNOSIS — Z79899 Other long term (current) drug therapy: Secondary | ICD-10-CM | POA: Insufficient documentation

## 2016-07-24 DIAGNOSIS — I10 Essential (primary) hypertension: Secondary | ICD-10-CM | POA: Insufficient documentation

## 2016-07-24 DIAGNOSIS — G4700 Insomnia, unspecified: Secondary | ICD-10-CM | POA: Insufficient documentation

## 2016-07-24 DIAGNOSIS — E119 Type 2 diabetes mellitus without complications: Secondary | ICD-10-CM | POA: Diagnosis not present

## 2016-07-24 LAB — URINALYSIS, ROUTINE W REFLEX MICROSCOPIC
BILIRUBIN URINE: NEGATIVE
Glucose, UA: >=500 mg/dL — AB
HGB URINE DIPSTICK: NEGATIVE
Ketones, ur: 5 mg/dL — AB
Leukocytes, UA: NEGATIVE
NITRITE: NEGATIVE
PROTEIN: NEGATIVE mg/dL
SPECIFIC GRAVITY, URINE: 1.009 (ref 1.005–1.030)
Squamous Epithelial / LPF: NONE SEEN
pH: 6 (ref 5.0–8.0)

## 2016-07-24 LAB — COMPREHENSIVE METABOLIC PANEL
ALBUMIN: 4.1 g/dL (ref 3.5–5.0)
ALT: 23 U/L (ref 17–63)
ANION GAP: 11 (ref 5–15)
AST: 17 U/L (ref 15–41)
Alkaline Phosphatase: 75 U/L (ref 38–126)
BUN: 14 mg/dL (ref 6–20)
CALCIUM: 9.2 mg/dL (ref 8.9–10.3)
CO2: 29 mmol/L (ref 22–32)
Chloride: 90 mmol/L — ABNORMAL LOW (ref 101–111)
Creatinine, Ser: 0.88 mg/dL (ref 0.61–1.24)
GFR calc non Af Amer: >60 mL/min (ref >60)
GLUCOSE: 323 mg/dL — AB (ref 65–99)
POTASSIUM: 4.7 mmol/L (ref 3.5–5.1)
SODIUM: 130 mmol/L — AB (ref 135–145)
TOTAL PROTEIN: 6.7 g/dL (ref 6.5–8.1)
Total Bilirubin: 0.9 mg/dL (ref 0.3–1.2)

## 2016-07-24 LAB — CBC
HEMATOCRIT: 47.8 % (ref 39.0–52.0)
HEMOGLOBIN: 17.2 g/dL — AB (ref 13.0–17.0)
MCH: 31.6 pg (ref 26.0–34.0)
MCHC: 36 g/dL (ref 30.0–36.0)
MCV: 87.9 fL (ref 78.0–100.0)
Platelets: 195 10*3/uL (ref 150–400)
RBC: 5.44 MIL/uL (ref 4.22–5.81)
RDW: 12.9 % (ref 11.5–15.5)
WBC: 9 10*3/uL (ref 4.0–10.5)

## 2016-07-24 LAB — RAPID URINE DRUG SCREEN, HOSP PERFORMED
Amphetamines: NOT DETECTED
BARBITURATES: NOT DETECTED
Benzodiazepines: NOT DETECTED
Cocaine: NOT DETECTED
Opiates: NOT DETECTED
TETRAHYDROCANNABINOL: NOT DETECTED

## 2016-07-24 LAB — CBG MONITORING, ED: GLUCOSE-CAPILLARY: 325 mg/dL — AB (ref 65–99)

## 2016-07-24 LAB — LIPASE, BLOOD: Lipase: 36 U/L (ref 11–51)

## 2016-07-24 LAB — ETHANOL

## 2016-07-24 LAB — SALICYLATE LEVEL

## 2016-07-24 LAB — ACETAMINOPHEN LEVEL: Acetaminophen (Tylenol), Serum: <10 ug/mL — ABNORMAL LOW (ref 10–30)

## 2016-07-24 MED ORDER — MONTELUKAST SODIUM 10 MG PO TABS
10.0000 mg | ORAL_TABLET | Freq: Every day | ORAL | Status: DC
  Administered 2016-07-24 – 2016-07-25 (×2): 10 mg via ORAL
  Filled 2016-07-24 (×2): qty 1

## 2016-07-24 MED ORDER — OLANZAPINE 10 MG PO TABS
20.0000 mg | ORAL_TABLET | Freq: Every day | ORAL | Status: DC
  Administered 2016-07-24 – 2016-07-25 (×2): 20 mg via ORAL
  Filled 2016-07-24 (×2): qty 2

## 2016-07-24 MED ORDER — METFORMIN HCL 500 MG PO TABS
1000.0000 mg | ORAL_TABLET | Freq: Two times a day (BID) | ORAL | Status: DC
  Administered 2016-07-25 (×2): 1000 mg via ORAL
  Filled 2016-07-24 (×2): qty 2

## 2016-07-24 MED ORDER — LISINOPRIL-HYDROCHLOROTHIAZIDE 20-25 MG PO TABS: 1.0000 | ORAL_TABLET | Freq: Every day | ORAL | Status: DC

## 2016-07-24 MED ORDER — HYDROCHLOROTHIAZIDE 25 MG PO TABS
25.0000 mg | ORAL_TABLET | Freq: Every day | ORAL | Status: DC
  Administered 2016-07-25: 25 mg via ORAL
  Filled 2016-07-24: qty 1

## 2016-07-24 MED ORDER — LISINOPRIL 20 MG PO TABS
20.0000 mg | ORAL_TABLET | Freq: Every day | ORAL | Status: DC
  Administered 2016-07-25: 20 mg via ORAL
  Filled 2016-07-24: qty 1

## 2016-07-24 MED ORDER — HYDRALAZINE HCL 25 MG PO TABS
25.0000 mg | ORAL_TABLET | Freq: Three times a day (TID) | ORAL | Status: DC
Start: 2016-07-24 — End: 2016-07-26
  Administered 2016-07-24 – 2016-07-25 (×4): 25 mg via ORAL
  Filled 2016-07-24 (×4): qty 1

## 2016-07-24 MED ORDER — INSULIN GLARGINE 100 UNIT/ML ~~LOC~~ SOLN
22.0000 [IU] | Freq: Every day | SUBCUTANEOUS | Status: DC
  Administered 2016-07-24 – 2016-07-25 (×2): 22 [IU] via SUBCUTANEOUS
  Filled 2016-07-24 (×2): qty 0.22

## 2016-07-24 MED ORDER — VENLAFAXINE HCL ER 75 MG PO CP24
150.0000 mg | ORAL_CAPSULE | Freq: Every day | ORAL | Status: DC
  Administered 2016-07-25: 150 mg via ORAL
  Filled 2016-07-24: qty 2

## 2016-07-24 MED ORDER — CANAGLIFLOZIN 300 MG PO TABS
300.0000 mg | ORAL_TABLET | Freq: Every day | ORAL | Status: DC
  Administered 2016-07-25: 300 mg via ORAL
  Filled 2016-07-24 (×2): qty 1

## 2016-07-24 MED ORDER — AMANTADINE HCL 100 MG PO CAPS
100.0000 mg | ORAL_CAPSULE | Freq: Two times a day (BID) | ORAL | Status: DC
  Administered 2016-07-24 – 2016-07-25 (×3): 100 mg via ORAL
  Filled 2016-07-24 (×3): qty 1

## 2016-07-24 MED ORDER — INSULIN ASPART 100 UNIT/ML ~~LOC~~ SOLN
10.0000 [IU] | Freq: Once | SUBCUTANEOUS | Status: AC
  Administered 2016-07-24: 10 [IU] via SUBCUTANEOUS
  Filled 2016-07-24: qty 1

## 2016-07-24 MED ORDER — TRAZODONE HCL 100 MG PO TABS
100.0000 mg | ORAL_TABLET | Freq: Every day | ORAL | Status: DC
  Administered 2016-07-24 – 2016-07-25 (×2): 100 mg via ORAL
  Filled 2016-07-24 (×2): qty 1

## 2016-07-24 MED ORDER — SIMVASTATIN 40 MG PO TABS
40.0000 mg | ORAL_TABLET | Freq: Every day | ORAL | Status: DC
  Administered 2016-07-24 – 2016-07-25 (×2): 40 mg via ORAL
  Filled 2016-07-24 (×2): qty 1
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
## 2016-07-24 NOTE — ED Notes (Signed)
Bed: FHL45 Expected date:  Expected time:  Means of arrival:  Comments: [place]

## 2016-07-24 NOTE — ED Notes (Addendum)
Pt has in his belongings bag a pair of grey sneakers, blue jeans with a black belt, a blue and white jacket and a green and lack shirt. Pt has been wanded by security

## 2016-07-24 NOTE — ED Provider Notes (Signed)
[facility]-EMERGENCY DEPT Provider Note   CSN: [id number] Arrival date & time: 07/24/16  1206     History   Chief Complaint Chief Complaint  Patient presents with  . Abdominal Pain  . Nausea  . Hallucinations    HPI Tony Cunningham is a 41 y.o. male who presents to the ED reporting posterior neck and head pain, mild nausea, anxiety, worsening insomnia and auditory hallucinations that are telling him that "I need to die" and are telling him to walk in front of a car to kill himself. Patient states "I feel like going crazy". Patient denies current suicidal ideation, homicidal ideation, visual or tactile hallucinations. Patient denies tobacco abuse and illicit drug use. The symptoms started last night. Patient has a long history of anxiety, depression, suicidal ideations and hallucinations, akathisia with multiple prior hospitalizations for prior suicidal attempts. Patient also has history of insomnia, opiate abuse, T2DM on insulin and metformin, hypertension, dyslipidemia, asthma, tobacco abuse. Patient is living in a group home, [facility], he is here with his peers support who assisted with history of present illness. Patient tells me that the last time he had auditory hallucinations was when he was admitted to the hospital on 07/17/2016, after discharge he denies having auditory hallucinations, suicidal ideations or homicidal ideations. Patient states he has been compliant with all his home medications. Patient states that he was recently started on olanzapine a week ago, he is concerned that this medication is causing adverse effects.    Per chart review patient was admitted for suicidal ideations and hallucinations and discharged on 07/17/2016. Discharge summary notes that patient has been taking olanzapine for quite some time.   HPI  Past Medical History:  Diagnosis Date  . Anxiety   . Asthma   . Diabetes mellitus   . High blood pressure   . Sinus complaint     Patient Active  Problem List   Diagnosis Date Noted  . Polysubstance abuse 07/17/2016  . Tardive dyskinesia 07/16/2016  . Tobacco use disorder 07/07/2016  . Dyslipidemia 07/07/2016  . Asthma 07/07/2016  . Schizoaffective disorder (HCC) 07/07/2016  . HTN (hypertension) 07/06/2016  . Diabetes (HCC) 12/25/2010    History reviewed. No pertinent surgical history.     Home Medications    Prior to Admission medications   Medication Sig Start Date End Date Taking? Authorizing Provider  amantadine (SYMMETREL) 100 MG capsule Take 1 capsule (100 mg total) by mouth 2 (two) times daily. 07/16/16   [name], MD  insulin aspart (NOVOLOG) 100 UNIT/ML injection Inject 17 Units into the skin 3 (three) times daily with meals. 07/16/16   [name], MD  insulin glargine (LANTUS) 100 UNIT/ML injection Inject 0.37 mLs (37 Units total) into the skin at bedtime. 07/16/16   [name], MD  lisinopril-hydrochlorothiazide (PRINZIDE,ZESTORETIC) 20-25 MG tablet Take 1 tablet by mouth daily. For high blood pressure 02/21/16   Sanjuana Kava, NP  metFORMIN (GLUCOPHAGE) 1000 MG tablet Take 1 tablet (1,000 mg total) by mouth 2 (two) times daily with a meal. 07/16/16   [name], MD  montelukast (SINGULAIR) 10 MG tablet Take 1 tablet (10 mg total) by mouth at bedtime. For Asthma 02/21/16   Sanjuana Kava, NP  OLANZapine (ZYPREXA) 20 MG tablet Take 1 tablet (20 mg total) by mouth at bedtime. 07/16/16   [name], MD  simvastatin (ZOCOR) 40 MG tablet Take 1 tablet (40 mg total) by mouth at bedtime. For high cholesterol 02/21/16   Sanjuana Kava,  NP  traZODone (DESYREL) 100 MG tablet Take 1 tablet (100 mg total) by mouth at bedtime. 07/16/16   [name], MD  venlafaxine XR (EFFEXOR-XR) 150 MG 24 hr capsule Take 2 capsules (300 mg total) by mouth daily with breakfast. 07/17/16   [name], MD    Family History No family history on  file.  Social History Social History  Substance Use Topics  . Smoking status: Current Every Day Smoker    Packs/day: 0.50    Types: Cigarettes  . Smokeless tobacco: Never Used  . Alcohol use No     Allergies   Patient has no known allergies.   Review of Systems Review of Systems  Constitutional: Negative for chills and fever.  HENT: Negative for congestion and sore throat.   Eyes: Negative for visual disturbance.  Respiratory: Negative for cough and shortness of breath.   Cardiovascular: Negative for chest pain and palpitations.  Gastrointestinal: Positive for nausea. Negative for abdominal pain, blood in stool, constipation, diarrhea and vomiting.  Endocrine: Negative for polydipsia and polyuria.  Genitourinary: Negative for difficulty urinating, flank pain and hematuria.  Musculoskeletal: Positive for neck pain. Negative for joint swelling and myalgias.  Skin: Negative for rash.  Neurological: Positive for headaches. Negative for dizziness, syncope, weakness and light-headedness.  Hematological: Negative.   Psychiatric/Behavioral: Positive for hallucinations and sleep disturbance. Negative for suicidal ideas. The patient is nervous/anxious.      Physical Exam Updated Vital Signs BP 117/77   Pulse 82   Temp 98 F (36.7 C) (Oral)   Resp 15   Wt 81.6 kg   SpO2 95%   BMI 24.41 kg/m   Physical Exam  Constitutional: He is oriented to person, place, and time. He appears well-developed and well-nourished. No distress.  Anxious appearing. Flat affect.  HENT:  Head: Normocephalic and atraumatic.  Nose: Nose normal.  Mouth/Throat: Oropharynx is clear and moist. No oropharyngeal exudate.  Eyes: Conjunctivae and EOM are normal. Pupils are equal, round, and reactive to light.  Neck: Normal range of motion. Neck supple. No JVD present. No tracheal deviation present.  Left sided and posterior neck tenderness.  Occipital prominence tenderness.  FROM of cervical spine.   Cardiovascular: Normal rate, regular rhythm, normal heart sounds and intact distal pulses.   No murmur heard. Pulmonary/Chest: Effort normal and breath sounds normal. No respiratory distress. He has no wheezes. He has no rales.  Abdominal: Soft. Bowel sounds are normal. He exhibits no distension. There is no tenderness.  Musculoskeletal: Normal range of motion. He exhibits no deformity.  Lymphadenopathy:    He has no cervical adenopathy.  Neurological: He is alert and oriented to person, place, and time. No cranial nerve deficit or sensory deficit. He exhibits normal muscle tone.  Generalized low amplitude tremor. No nystagmus. No meningeal signs.   Skin: Skin is warm and dry. Capillary refill takes less than 2 seconds.  Psychiatric: He has a normal mood and affect. His behavior is normal. Judgment and thought content normal.  Nursing note and vitals reviewed.    ED Treatments / Results  Labs (all labs ordered are listed, but only abnormal results are displayed) Labs Reviewed  COMPREHENSIVE METABOLIC PANEL - Abnormal; Notable for the following:       Result Value   Sodium 130 (*)    Chloride 90 (*)    Glucose, Bld 323 (*)    All other components within normal limits  CBC - Abnormal; Notable for the following:    Hemoglobin 17.2 (*)  All other components within normal limits  URINALYSIS, ROUTINE W REFLEX MICROSCOPIC - Abnormal; Notable for the following:    Color, Urine STRAW (*)    Glucose, UA >=500 (*)    Ketones, ur 5 (*)    Bacteria, UA FEW (*)    All other components within normal limits  ACETAMINOPHEN LEVEL - Abnormal; Notable for the following:    Acetaminophen (Tylenol), Serum <10 (*)    All other components within normal limits  LIPASE, BLOOD  ETHANOL  SALICYLATE LEVEL  RAPID URINE DRUG SCREEN, HOSP PERFORMED    EKG  EKG Interpretation None       Radiology No results found.  Procedures Procedures (including critical care time)  Medications Ordered  in ED Medications - No data to display   Initial Impression / Assessment and Plan / ED Course  I have reviewed the triage vital signs and the nursing notes.  Pertinent labs & imaging results that were available during my care of the patient were reviewed by me and considered in my medical decision making (see chart for details).  Clinical Course as of Jul 25 1654  Fri Jul 24, 2016  1559 Patient's companion called group home to ask about recent medication changes, per group home patient started taking olanzapine one week ago.  All other meds unchanged  [CG]  1651 Updated patient about labs.  Patient still reporting hallucinations, states he does not feel safe going home  [CG]    Clinical Course User Index [CG] Liberty Handy, PA-C   41 year old male with extensive psychiatric history including depression, anxiety, previous suicidal attempts and recent admission for hallucinations and suicidal ideations on 07/17/2016 presents to the emergency department reporting auditory hallucinations telling him to kill himself and walk into traffic. Hallucinations started last night and are associated with pain to the back of his head and his neck. No other associated symptoms. I reviewed patient's recent discharge summary, it appears that at that time he also presented with pain in the back of his head and neck and auditory hallucinations. Patient's companion is at bedside and states patient has not been sleeping well since discharge and has been stating "i am going crazy".  Patient's companion called patient's group home who states patient has been compliant with psych medications. Patient is having active auditory hallucinations. ED lab work unremarkable. Patient will be placed on psych hold. TTS consulted.  Final Clinical Impressions(s) / ED Diagnoses   Final diagnoses:  Auditory hallucinations    New Prescriptions New Prescriptions   No medications on file     Jerrell Mylar 07/24/16  1656    Azalia Bilis, MD 07/24/16 [phone]

## 2016-07-24 NOTE — ED Notes (Signed)
SBAR Report received from previous nurse. Pt received calm and visible on unit. Pt denies current SI/ HI, depression, anxiety 5/10, or pain at this time, and appears otherwise stable and free of distress pt endorses current A/ V H at this time stating they are"milder". Pt reminded of camera surveillance, q 15 min rounds, and rules of the milieu. Will continue to assess.

## 2016-07-24 NOTE — ED Triage Notes (Signed)
Pt complaint of generalized abdominal pain with associated nausea onset this morning. Pt denies emesis or diarrhea. Pt continues to verbalize AH related to new medication change; denies SI/HI.

## 2016-07-24 NOTE — ED Notes (Signed)
Called EDP to verify that pt was to received long acting insulin in full regular dose. EDP confirmed pt dosage for this night, even though pt has already received 10 units short acting.

## 2016-07-25 ENCOUNTER — Encounter (HOSPITAL_COMMUNITY): Payer: Self-pay | Admitting: Registered Nurse

## 2016-07-25 DIAGNOSIS — G2401 Drug induced subacute dyskinesia: Secondary | ICD-10-CM

## 2016-07-25 DIAGNOSIS — F251 Schizoaffective disorder, depressive type: Secondary | ICD-10-CM

## 2016-07-25 DIAGNOSIS — Z79899 Other long term (current) drug therapy: Secondary | ICD-10-CM | POA: Diagnosis not present

## 2016-07-25 DIAGNOSIS — R440 Auditory hallucinations: Secondary | ICD-10-CM | POA: Diagnosis not present

## 2016-07-25 DIAGNOSIS — F17210 Nicotine dependence, cigarettes, uncomplicated: Secondary | ICD-10-CM | POA: Diagnosis not present

## 2016-07-25 LAB — CBG MONITORING, ED
Glucose-Capillary: 223 mg/dL — ABNORMAL HIGH (ref 65–99)
Glucose-Capillary: 253 mg/dL — ABNORMAL HIGH (ref 65–99)

## 2016-07-25 MED ORDER — CANAGLIFLOZIN 300 MG PO TABS: 300.0000 mg | ORAL_TABLET | Freq: Every day | ORAL | Status: DC

## 2016-07-25 MED ORDER — BENZTROPINE MESYLATE 0.5 MG PO TABS
0.5000 mg | ORAL_TABLET | Freq: Two times a day (BID) | ORAL | Status: DC
  Administered 2016-07-25 (×2): 0.5 mg via ORAL
  Filled 2016-07-25 (×2): qty 1

## 2016-07-25 NOTE — ED Notes (Signed)
Patient transported to [facility] via [facility]; no distress noted at discharge.

## 2016-07-25 NOTE — ED Notes (Signed)
Called to give report. Per unit secretary, advised to call again in an hour when nurse would be available for report.

## 2016-07-25 NOTE — Progress Notes (Addendum)
Thomes Cake from [facility] called and accepted pt to Dr Robet Leu.  Bed 105B. Call report to [phone].  Bed is available immediately. Garner Nash, MSW, LCSW Clinical Social Worker 07/25/2016 5:10 PM

## 2016-07-25 NOTE — BH Assessment (Signed)
Patient has been referred to the following:  [facility] - [phone] [facility] - [phone] [facility] - [phone] [facility] - [phone] [address] - [phone] [facility] - [phone] [facility] - [phone] [facility] - [phone] [facility]  - [phone] [place] [address] - [phone] [facility] - [phone]  Davina Poke, [place] Therapeutic Triage Specialist [facility] 07/25/2016 12:43 PM

## 2016-07-25 NOTE — ED Notes (Signed)
Introduced self to patient. Pt oriented to unit expectations.  Assessed pt for:  A) Anxiety &/or agitation: Pt has been calm and cooperative today. He stays in his room.  Pt reports hearing voices to step out in front of a car. His speech is slow and uncertain. He has tremors.   S) Safety: Safety maintained with q-15-minute checks and hourly rounds by staff.  A) ADLs: Pt able to perform ADLs independently.  P) Pick-Up (room cleanliness): Pt's room clean and free of clutter.

## 2016-07-25 NOTE — ED Notes (Signed)
Patient resting in bed with his eyes closed; no distress noted.

## 2016-07-25 NOTE — Discharge Instructions (Signed)
Transfer to [facility] 

## 2016-07-25 NOTE — ED Notes (Signed)
Attempted to call report to [facility], no answer at this time.

## 2016-07-25 NOTE — ED Notes (Signed)
[facility] notified of need for transportation of patient to [facility].

## 2016-07-25 NOTE — ED Provider Notes (Signed)
BH team indicates pt accepted to [facility], Dr Robet Leu accepting doc.   Patient appears comfortable, nad.  Vitals:   07/25/16 1020 07/25/16 2037  BP: 107/77 102/65  Pulse: 96 77  Resp: 18 18  Temp:  98.3 F (36.8 C)   Pt currently appears stable for transfer.     Cathren Laine, MD 07/25/16 2214

## 2016-07-25 NOTE — Consult Note (Signed)
[place] Psychiatry Consult   Reason for Consult:  Psychosis Referring Physician:  EDP Patient Identification: Tony Cunningham MRN:  [id number] Principal Diagnosis: Schizoaffective disorder [facility]) Diagnosis:   Patient Active Problem List   Diagnosis Date Noted  . Polysubstance abuse [F19.10] 07/17/2016  . Tardive dyskinesia [G24.01] 07/16/2016  . Tobacco use disorder [F17.200] 07/07/2016  . Dyslipidemia [E78.5] 07/07/2016  . Asthma [J45.909] 07/07/2016  . Schizoaffective disorder ([place]) [F25.9] 07/07/2016  . HTN (hypertension) [I10] 07/06/2016  . Diabetes ([place]) [E11.9] 12/25/2010    Total Time spent with patient: 45 minutes  Subjective:   Tony Cunningham is a 41 y.o. male patient presented to [facility] with complaints of auditory hallucinations.  HPI:  Tony Cunningham 41 y.o. male   Past Psychiatric History: patient seen by Dr. Modesta Messing and this provider.  Chart reviewed 07/25/16.   On evaluation:  Tony Cunningham reports that he was hearing voices telling him to get ran over.  States that the voices were getting worse and couldn't controlled and it was difficult to distract.  He denies suicidal thoughts but states that he is unable to control the voices.  Patient endorses depression, fatigue, and decreased appetite.  Patient denies homicidal ideation   Risk to Self: Suicidal Ideation: Yes-Currently Present Suicidal Intent: Yes-Currently Present Is patient at risk for suicide?: No Suicidal Plan?: Yes-Currently Present Specify Current Suicidal Plan:  (cutting self ) Access to Means: No What has been your use of drugs/alcohol within the last 12 months?:  (denies; hx of oxycodone use) How many times?:  (2x's) Other Self Harm Risks:  (reports of none ) Triggers for Past Attempts: Family contact, Hallucinations Intentional Self Injurious Behavior: None Risk to Others: Homicidal Ideation: No Thoughts of Harm to Others: No Current Homicidal Intent: No Current Homicidal Plan: No Access to  Homicidal Means: No Identified Victim:  (Reports none ) History of harm to others?: No Assessment of Violence: None Noted Does patient have access to weapons?: No Criminal Charges Pending?: No Does patient have a court date: No Prior Inpatient Therapy: Prior Inpatient Therapy: Yes Prior Therapy Dates: Unable to remember dates Prior Therapy Facilty/Provider(s): [place], [place] Reason for Treatment: SI & A/H Prior Outpatient Therapy: Prior Outpatient Therapy: Yes Prior Therapy Dates: Current Prior Therapy Facilty/Provider(s): Unable to remember dates Reason for Treatment: SI & A/V Does patient have an ACCT team?: No Does patient have Intensive In-House Services?  : No Does patient have [facility] services? : No  Past Medical History:  Past Medical History:  Diagnosis Date  . Anxiety   . Asthma   . Diabetes mellitus   . High blood pressure   . Sinus complaint    History reviewed. No pertinent surgical history. Family History: History reviewed. No pertinent family history. Family Psychiatric  History: Unaware Social History:  History  Alcohol Use No     History  Drug Use No    Social History   Social History  . Marital status: Single    Spouse name: [name]  . Number of children: [name]  . Years of education: [name]   Social History Main Topics  . Smoking status: Current Every Day Smoker    Packs/day: 0.50    Types: Cigarettes  . Smokeless tobacco: Never Used  . Alcohol use No  . Drug use: No  . Sexual activity: Not Currently   Other Topics Concern  . None   Social History Narrative  . None   Additional Social History:    Allergies:  No Known Allergies  Labs:  Results for orders placed or performed during the hospital encounter of 07/24/16 (from the past 48 hour(s))  Lipase, blood     Status: None   Collection Time: 07/24/16 12:21 PM  Result Value Ref Range   Lipase 36 11 - 51 U/L  Comprehensive metabolic panel     Status: Abnormal    Collection Time: 07/24/16 12:21 PM  Result Value Ref Range   Sodium 130 (L) 135 - 145 mmol/L   Potassium 4.7 3.5 - 5.1 mmol/L   Chloride 90 (L) 101 - 111 mmol/L   CO2 29 22 - 32 mmol/L   Glucose, Bld 323 (H) 65 - 99 mg/dL   BUN 14 6 - 20 mg/dL   Creatinine, Ser 0.88 0.61 - 1.24 mg/dL   Calcium 9.2 8.9 - 10.3 mg/dL   Total Protein 6.7 6.5 - 8.1 g/dL   Albumin 4.1 3.5 - 5.0 g/dL   AST 17 15 - 41 U/L   ALT 23 17 - 63 U/L   Alkaline Phosphatase 75 38 - 126 U/L   Total Bilirubin 0.9 0.3 - 1.2 mg/dL   GFR calc non Af Amer >60 >60 mL/min   GFR calc Af Amer >60 >60 mL/min    Comment: (NOTE) The eGFR has been calculated using the CKD EPI equation. This calculation has not been validated in all clinical situations. eGFR's persistently <60 mL/min signify possible Chronic Kidney Disease.    Anion gap 11 5 - 15  CBC     Status: Abnormal   Collection Time: 07/24/16 12:21 PM  Result Value Ref Range   WBC 9.0 4.0 - 10.5 K/uL   RBC 5.44 4.22 - 5.81 MIL/uL   Hemoglobin 17.2 (H) 13.0 - 17.0 g/dL   HCT 47.8 39.0 - 52.0 %   MCV 87.9 78.0 - 100.0 fL   MCH 31.6 26.0 - 34.0 pg   MCHC 36.0 30.0 - 36.0 g/dL   RDW 12.9 11.5 - 15.5 %   Platelets 195 150 - 400 K/uL  Ethanol     Status: None   Collection Time: 07/24/16 12:21 PM  Result Value Ref Range   Alcohol, Ethyl (B) <5 <5 mg/dL    Comment:        LOWEST DETECTABLE LIMIT FOR SERUM ALCOHOL IS 5 mg/dL FOR MEDICAL PURPOSES ONLY   Salicylate level     Status: None   Collection Time: 07/24/16 12:21 PM  Result Value Ref Range   Salicylate Lvl <1.7 2.8 - 30.0 mg/dL  Acetaminophen level     Status: Abnormal   Collection Time: 07/24/16 12:21 PM  Result Value Ref Range   Acetaminophen (Tylenol), Serum <10 (L) 10 - 30 ug/mL    Comment:        THERAPEUTIC CONCENTRATIONS VARY SIGNIFICANTLY. A RANGE OF 10-30 ug/mL MAY BE AN EFFECTIVE CONCENTRATION FOR MANY PATIENTS. HOWEVER, SOME ARE BEST TREATED AT CONCENTRATIONS OUTSIDE  THIS RANGE. ACETAMINOPHEN CONCENTRATIONS >150 ug/mL AT 4 HOURS AFTER INGESTION AND >50 ug/mL AT 12 HOURS AFTER INGESTION ARE OFTEN ASSOCIATED WITH TOXIC REACTIONS.   Urinalysis, Routine w reflex microscopic     Status: Abnormal   Collection Time: 07/24/16  3:15 PM  Result Value Ref Range   Color, Urine STRAW (A) YELLOW   APPearance CLEAR CLEAR   Specific Gravity, Urine 1.009 1.005 - 1.030   pH 6.0 5.0 - 8.0   Glucose, UA >=500 (A) NEGATIVE mg/dL   Hgb urine dipstick NEGATIVE NEGATIVE   Bilirubin Urine  NEGATIVE NEGATIVE   Ketones, ur 5 (A) NEGATIVE mg/dL   Protein, ur NEGATIVE NEGATIVE mg/dL   Nitrite NEGATIVE NEGATIVE   Leukocytes, UA NEGATIVE NEGATIVE   RBC / HPF 0-5 0 - 5 RBC/hpf   WBC, UA 0-5 0 - 5 WBC/hpf   Bacteria, UA FEW (A) NONE SEEN   Squamous Epithelial / LPF NONE SEEN NONE SEEN  Rapid urine drug screen (hospital performed)     Status: None   Collection Time: 07/24/16  3:15 PM  Result Value Ref Range   Opiates NONE DETECTED NONE DETECTED   Cocaine NONE DETECTED NONE DETECTED   Benzodiazepines NONE DETECTED NONE DETECTED   Amphetamines NONE DETECTED NONE DETECTED   Tetrahydrocannabinol NONE DETECTED NONE DETECTED   Barbiturates NONE DETECTED NONE DETECTED    Comment:        DRUG SCREEN FOR MEDICAL PURPOSES ONLY.  IF CONFIRMATION IS NEEDED FOR ANY PURPOSE, NOTIFY LAB WITHIN 5 DAYS.        LOWEST DETECTABLE LIMITS FOR URINE DRUG SCREEN Drug Class       Cutoff (ng/mL) Amphetamine      1000 Barbiturate      200 Benzodiazepine   798 Tricyclics       921 Opiates          300 Cocaine          300 THC              50   CBG monitoring, ED     Status: Abnormal   Collection Time: 07/24/16  6:55 PM  Result Value Ref Range   Glucose-Capillary 325 (H) 65 - 99 mg/dL    Current Facility-Administered Medications  Medication Dose Route Frequency Provider Last Rate Last Dose  . amantadine (SYMMETREL) capsule 100 mg  100 mg Oral BID Kinnie Feil, PA-C   100 mg  at 07/25/16 1022  . benztropine (COGENTIN) tablet 0.5 mg  0.5 mg Oral BID Shuvon B Rankin, NP      . canagliflozin (INVOKANA) tablet 300 mg  300 mg Oral QAC breakfast Kinnie Feil, PA-C   300 mg at 07/25/16 1941  . hydrALAZINE (APRESOLINE) tablet 25 mg  25 mg Oral TID Kinnie Feil, PA-C   25 mg at 07/25/16 1022  . hydrochlorothiazide (HYDRODIURIL) tablet 25 mg  25 mg Oral Daily Jola Schmidt, MD   25 mg at 07/25/16 1022  . insulin glargine (LANTUS) injection 22 Units  22 Units Subcutaneous QHS Kinnie Feil, PA-C   22 Units at 07/24/16 2305  . lisinopril (PRINIVIL,ZESTRIL) tablet 20 mg  20 mg Oral Daily Jola Schmidt, MD   20 mg at 07/25/16 1022  . metFORMIN (GLUCOPHAGE) tablet 1,000 mg  1,000 mg Oral BID WC Kinnie Feil, PA-C   1,000 mg at 07/25/16 0724  . montelukast (SINGULAIR) tablet 10 mg  10 mg Oral QHS Kinnie Feil, PA-C   10 mg at 07/24/16 2257  . OLANZapine (ZYPREXA) tablet 20 mg  20 mg Oral QHS Kinnie Feil, PA-C   20 mg at 07/24/16 2257  . simvastatin (ZOCOR) tablet 40 mg  40 mg Oral QHS Kinnie Feil, PA-C   40 mg at 07/24/16 2257  . traZODone (DESYREL) tablet 100 mg  100 mg Oral QHS Kinnie Feil, PA-C   100 mg at 07/24/16 2257  . venlafaxine XR (EFFEXOR-XR) 24 hr capsule 150 mg  150 mg Oral Q breakfast Kinnie Feil, PA-C   150 mg at 07/25/16  0724   Current Outpatient Prescriptions  Medication Sig Dispense Refill  . amantadine (SYMMETREL) 100 MG capsule Take 1 capsule (100 mg total) by mouth 2 (two) times daily. 60 capsule 0  . canagliflozin (INVOKANA) 300 MG TABS tablet Take 300 mg by mouth daily before breakfast.    . hydrALAZINE (APRESOLINE) 25 MG tablet Take 25 mg by mouth 3 (three) times daily.    . insulin glargine (LANTUS) 100 UNIT/ML injection Inject 0.37 mLs (37 Units total) into the skin at bedtime. (Patient taking differently: Inject 22 Units into the skin at bedtime. ) 11 mL 0  . lisinopril-hydrochlorothiazide (PRINZIDE,ZESTORETIC)  20-25 MG tablet Take 1 tablet by mouth daily. For high blood pressure 30 tablet 0  . metFORMIN (GLUCOPHAGE) 1000 MG tablet Take 1 tablet (1,000 mg total) by mouth 2 (two) times daily with a meal. 60 tablet 0  . montelukast (SINGULAIR) 10 MG tablet Take 1 tablet (10 mg total) by mouth at bedtime. For Asthma 30 tablet 0  . OLANZapine (ZYPREXA) 20 MG tablet Take 1 tablet (20 mg total) by mouth at bedtime. 30 tablet 0  . simvastatin (ZOCOR) 40 MG tablet Take 1 tablet (40 mg total) by mouth at bedtime. For high cholesterol 15 tablet 0  . traZODone (DESYREL) 150 MG tablet Take 150 mg by mouth at bedtime.    Marland Kitchen venlafaxine XR (EFFEXOR-XR) 150 MG 24 hr capsule Take 2 capsules (300 mg total) by mouth daily with breakfast. (Patient taking differently: Take 150 mg by mouth daily with breakfast. ) 60 capsule 0  . traZODone (DESYREL) 100 MG tablet Take 1 tablet (100 mg total) by mouth at bedtime. (Patient not taking: Reported on 07/24/2016) 30 tablet 0    Musculoskeletal: Strength & Muscle Tone: within normal limits Gait & Station: normal Patient leans: [name]  Psychiatric Specialty Exam: Physical Exam  Constitutional: He appears well-developed and well-nourished.    Review of Systems  Psychiatric/Behavioral: Positive for depression, substance abuse and suicidal ideas. The patient is nervous/anxious and has insomnia.   All other systems reviewed and are negative.   Blood pressure 107/77, pulse 96, temperature 98.1 F (36.7 C), temperature source Oral, resp. rate 18, weight 81.6 kg (180 lb), SpO2 98 %.Body mass index is 24.41 kg/m.  General Appearance: Fairly Groomed  Eye Contact:  Good  Speech:  Clear and Coherent  Volume:  Decreased  Mood:  Depressed  Affect:  Constricted  Thought Process:  Linear  Orientation:  Full (Time, Place, and Person)  Thought Content:  Hallucinations: Auditory Command:  to walk in to traffic  Suicidal Thoughts:  No  Homicidal Thoughts:  No  Memory:  Immediate;    Poor Recent;   Poor Remote;   Poor  Judgement:  Impaired  Insight:  Lacking  Psychomotor Activity:  Decreased  Concentration:  Concentration: Poor and Attention Span: Poor  Recall:  Poor  Fund of Knowledge:  Fair  Language:  Fair  Akathisia:  Yes  Handed:  Right  AIMS (if indicated):     Assets:  Communication Skills Desire for Improvement  ADL's:  Intact  Cognition:  WNL  Sleep:        Treatment Plan Summary: Daily contact with patient to assess and evaluate symptoms and progress in treatment and Medication management Medications started Zyprexa 20 mg daily at bed time for psychosis Trazodone 100 mg Qhs prn for insomnia Effexor XR 150 mg at breakfast for Major Depression Cogentin 0.5 mg bid for EPS Disposition: Recommend psychiatric Inpatient admission when  medically cleared.    Rankin, Shuvon, NP 07/25/2016 3:36 PM

## 2016-09-23 ENCOUNTER — Encounter: Payer: Self-pay | Admitting: Emergency Medicine

## 2016-09-23 ENCOUNTER — Emergency Department
Admission: EM | Admit: 2016-09-23 | Discharge: 2016-09-23 | Disposition: A | Discharge status: Other Acute Inpatient Hospital | Payer: Medicare Other | Attending: Emergency Medicine | Admitting: Emergency Medicine

## 2016-09-23 ENCOUNTER — Inpatient Hospital Stay
Admission: AD | Admit: 2016-09-23 | Discharge: 2016-10-03 | DRG: 885 | Disposition: A | Discharge status: Home / Self Care | Payer: Medicare Other | Source: Intra-hospital | Attending: Psychiatry | Admitting: Psychiatry

## 2016-09-23 DIAGNOSIS — R440 Auditory hallucinations: Secondary | ICD-10-CM | POA: Insufficient documentation

## 2016-09-23 DIAGNOSIS — J45909 Unspecified asthma, uncomplicated: Secondary | ICD-10-CM | POA: Diagnosis present

## 2016-09-23 DIAGNOSIS — G2401 Drug induced subacute dyskinesia: Secondary | ICD-10-CM | POA: Diagnosis present

## 2016-09-23 DIAGNOSIS — Z915 Personal history of self-harm: Secondary | ICD-10-CM

## 2016-09-23 DIAGNOSIS — E871 Hypo-osmolality and hyponatremia: Secondary | ICD-10-CM | POA: Insufficient documentation

## 2016-09-23 DIAGNOSIS — F17210 Nicotine dependence, cigarettes, uncomplicated: Secondary | ICD-10-CM | POA: Diagnosis not present

## 2016-09-23 DIAGNOSIS — I10 Essential (primary) hypertension: Secondary | ICD-10-CM | POA: Diagnosis present

## 2016-09-23 DIAGNOSIS — F251 Schizoaffective disorder, depressive type: Principal | ICD-10-CM | POA: Diagnosis present

## 2016-09-23 DIAGNOSIS — E1165 Type 2 diabetes mellitus with hyperglycemia: Secondary | ICD-10-CM | POA: Diagnosis present

## 2016-09-23 DIAGNOSIS — Z79899 Other long term (current) drug therapy: Secondary | ICD-10-CM | POA: Insufficient documentation

## 2016-09-23 DIAGNOSIS — E119 Type 2 diabetes mellitus without complications: Secondary | ICD-10-CM

## 2016-09-23 DIAGNOSIS — F419 Anxiety disorder, unspecified: Secondary | ICD-10-CM | POA: Diagnosis present

## 2016-09-23 DIAGNOSIS — Z794 Long term (current) use of insulin: Secondary | ICD-10-CM | POA: Diagnosis not present

## 2016-09-23 DIAGNOSIS — E785 Hyperlipidemia, unspecified: Secondary | ICD-10-CM | POA: Diagnosis present

## 2016-09-23 DIAGNOSIS — F1910 Other psychoactive substance abuse, uncomplicated: Secondary | ICD-10-CM | POA: Diagnosis present

## 2016-09-23 DIAGNOSIS — Z720 Tobacco use: Secondary | ICD-10-CM | POA: Diagnosis not present

## 2016-09-23 DIAGNOSIS — G8929 Other chronic pain: Secondary | ICD-10-CM | POA: Insufficient documentation

## 2016-09-23 DIAGNOSIS — Z046 Encounter for general psychiatric examination, requested by authority: Secondary | ICD-10-CM | POA: Diagnosis present

## 2016-09-23 DIAGNOSIS — Z716 Tobacco abuse counseling: Secondary | ICD-10-CM

## 2016-09-23 DIAGNOSIS — F1110 Opioid abuse, uncomplicated: Secondary | ICD-10-CM | POA: Diagnosis present

## 2016-09-23 DIAGNOSIS — M542 Cervicalgia: Secondary | ICD-10-CM | POA: Diagnosis not present

## 2016-09-23 DIAGNOSIS — G4700 Insomnia, unspecified: Secondary | ICD-10-CM | POA: Diagnosis present

## 2016-09-23 DIAGNOSIS — Z7984 Long term (current) use of oral hypoglycemic drugs: Secondary | ICD-10-CM | POA: Insufficient documentation

## 2016-09-23 DIAGNOSIS — R739 Hyperglycemia, unspecified: Secondary | ICD-10-CM

## 2016-09-23 DIAGNOSIS — R45851 Suicidal ideations: Secondary | ICD-10-CM | POA: Diagnosis present

## 2016-09-23 DIAGNOSIS — F17200 Nicotine dependence, unspecified, uncomplicated: Secondary | ICD-10-CM | POA: Diagnosis present

## 2016-09-23 LAB — URINALYSIS, COMPLETE (UACMP) WITH MICROSCOPIC
BILIRUBIN URINE: NEGATIVE
Glucose, UA: >=500 mg/dL — AB
Hgb urine dipstick: NEGATIVE
KETONES UR: 20 mg/dL — AB
Leukocytes, UA: NEGATIVE
Nitrite: NEGATIVE
PH: 6 (ref 5.0–8.0)
PROTEIN: 30 mg/dL — AB
SQUAMOUS EPITHELIAL / LPF: NONE SEEN
Specific Gravity, Urine: 1.012 (ref 1.005–1.030)

## 2016-09-23 LAB — CBC
HCT: 47 % (ref 40.0–52.0)
Hemoglobin: 16.4 g/dL (ref 13.0–18.0)
MCH: 31.8 pg (ref 26.0–34.0)
MCHC: 34.9 g/dL (ref 32.0–36.0)
MCV: 91 fL (ref 80.0–100.0)
Platelets: 215 10*3/uL (ref 150–440)
RBC: 5.17 MIL/uL (ref 4.40–5.90)
RDW: 13.1 % (ref 11.5–14.5)
WBC: 8.5 10*3/uL (ref 3.8–10.6)

## 2016-09-23 LAB — BASIC METABOLIC PANEL
ANION GAP: 7 (ref 5–15)
BUN: 9 mg/dL (ref 6–20)
CALCIUM: 8.3 mg/dL — AB (ref 8.9–10.3)
CO2: 29 mmol/L (ref 22–32)
Chloride: 95 mmol/L — ABNORMAL LOW (ref 101–111)
Creatinine, Ser: 0.57 mg/dL — ABNORMAL LOW (ref 0.61–1.24)
Glucose, Bld: 237 mg/dL — ABNORMAL HIGH (ref 65–99)
Potassium: 3.6 mmol/L (ref 3.5–5.1)
SODIUM: 131 mmol/L — AB (ref 135–145)

## 2016-09-23 LAB — URINE DRUG SCREEN, QUALITATIVE (ARMC ONLY)
Amphetamines, Ur Screen: NOT DETECTED
BARBITURATES, UR SCREEN: NOT DETECTED
BENZODIAZEPINE, UR SCRN: NOT DETECTED
CANNABINOID 50 NG, UR ~~LOC~~: NOT DETECTED
Cocaine Metabolite,Ur ~~LOC~~: NOT DETECTED
MDMA (Ecstasy)Ur Screen: NOT DETECTED
Methadone Scn, Ur: NOT DETECTED
OPIATE, UR SCREEN: NOT DETECTED
PHENCYCLIDINE (PCP) UR S: NOT DETECTED
Tricyclic, Ur Screen: NOT DETECTED

## 2016-09-23 LAB — COMPREHENSIVE METABOLIC PANEL
ALT: 19 U/L (ref 17–63)
ANION GAP: 13 (ref 5–15)
AST: 22 U/L (ref 15–41)
Albumin: 4 g/dL (ref 3.5–5.0)
Alkaline Phosphatase: 68 U/L (ref 38–126)
BUN: 10 mg/dL (ref 6–20)
CHLORIDE: 91 mmol/L — AB (ref 101–111)
CO2: 27 mmol/L (ref 22–32)
CREATININE: 0.69 mg/dL (ref 0.61–1.24)
Calcium: 9 mg/dL (ref 8.9–10.3)
GFR calc Af Amer: >60 mL/min (ref >60)
Glucose, Bld: 396 mg/dL — ABNORMAL HIGH (ref 65–99)
POTASSIUM: 4.3 mmol/L (ref 3.5–5.1)
Sodium: 131 mmol/L — ABNORMAL LOW (ref 135–145)
Total Bilirubin: 0.6 mg/dL (ref 0.3–1.2)
Total Protein: 6.5 g/dL (ref 6.5–8.1)

## 2016-09-23 LAB — ETHANOL

## 2016-09-23 LAB — SALICYLATE LEVEL

## 2016-09-23 LAB — GLUCOSE, CAPILLARY
GLUCOSE-CAPILLARY: 349 mg/dL — AB (ref 65–99)
GLUCOSE-CAPILLARY: 217 mg/dL — AB (ref 65–99)
GLUCOSE-CAPILLARY: 335 mg/dL — AB (ref 65–99)
Glucose-Capillary: 322 mg/dL — ABNORMAL HIGH (ref 65–99)

## 2016-09-23 LAB — ACETAMINOPHEN LEVEL

## 2016-09-23 MED ORDER — INSULIN ASPART 100 UNIT/ML ~~LOC~~ SOLN
0.0000 [IU] | Freq: Three times a day (TID) | SUBCUTANEOUS | Status: DC
  Administered 2016-09-23: 5 [IU] via SUBCUTANEOUS
  Filled 2016-09-23: qty 5

## 2016-09-23 MED ORDER — METFORMIN HCL 500 MG PO TABS
1000.0000 mg | ORAL_TABLET | Freq: Two times a day (BID) | ORAL | Status: DC
  Administered 2016-09-23: 1000 mg via ORAL
  Filled 2016-09-23: qty 2

## 2016-09-23 MED ORDER — SODIUM CHLORIDE 0.9 % IV BOLUS (SEPSIS)
1000.0000 mL | Freq: Once | INTRAVENOUS | Status: AC
  Administered 2016-09-23: 1000 mL via INTRAVENOUS

## 2016-09-23 MED ORDER — ACETAMINOPHEN 325 MG PO TABS
650.0000 mg | ORAL_TABLET | Freq: Four times a day (QID) | ORAL | Status: DC | PRN
  Administered 2016-09-24 – 2016-10-02 (×6): 650 mg via ORAL
  Filled 2016-09-23 (×8): qty 2

## 2016-09-23 MED ORDER — ALUM & MAG HYDROXIDE-SIMETH 200-200-20 MG/5ML PO SUSP: 30.0000 mL | ORAL | Status: DC | PRN

## 2016-09-23 MED ORDER — SIMVASTATIN 40 MG PO TABS
40.0000 mg | ORAL_TABLET | Freq: Every day | ORAL | Status: DC
  Administered 2016-09-24 – 2016-10-02 (×9): 40 mg via ORAL
  Filled 2016-09-23 (×9): qty 1

## 2016-09-23 MED ORDER — LISINOPRIL 20 MG PO TABS
20.0000 mg | ORAL_TABLET | Freq: Every day | ORAL | Status: DC
  Administered 2016-09-24 – 2016-10-03 (×10): 20 mg via ORAL
  Filled 2016-09-23 (×10): qty 1

## 2016-09-23 MED ORDER — VENLAFAXINE HCL ER 150 MG PO CP24: 300.0000 mg | ORAL_CAPSULE | Freq: Every day | ORAL | Status: DC

## 2016-09-23 MED ORDER — LISINOPRIL 10 MG PO TABS: 20.0000 mg | ORAL_TABLET | Freq: Every day | ORAL | Status: DC

## 2016-09-23 MED ORDER — AMANTADINE HCL 100 MG PO CAPS
100.0000 mg | ORAL_CAPSULE | Freq: Two times a day (BID) | ORAL | Status: DC
  Administered 2016-09-23: 100 mg via ORAL
  Filled 2016-09-23: qty 1

## 2016-09-23 MED ORDER — AMANTADINE HCL 100 MG PO CAPS
100.0000 mg | ORAL_CAPSULE | Freq: Two times a day (BID) | ORAL | Status: DC
  Administered 2016-09-24 – 2016-09-25 (×4): 100 mg via ORAL
  Filled 2016-09-23 (×4): qty 1

## 2016-09-23 MED ORDER — MONTELUKAST SODIUM 10 MG PO TABS
10.0000 mg | ORAL_TABLET | Freq: Every day | ORAL | Status: DC
  Administered 2016-09-23: 10 mg via ORAL
  Filled 2016-09-23: qty 1

## 2016-09-23 MED ORDER — INSULIN ASPART 100 UNIT/ML ~~LOC~~ SOLN
0.0000 [IU] | Freq: Three times a day (TID) | SUBCUTANEOUS | Status: DC
  Administered 2016-09-24: 11 [IU] via SUBCUTANEOUS

## 2016-09-23 MED ORDER — HYDRALAZINE HCL 50 MG PO TABS
25.0000 mg | ORAL_TABLET | Freq: Three times a day (TID) | ORAL | Status: DC
  Filled 2016-09-23: qty 1

## 2016-09-23 MED ORDER — SIMVASTATIN 10 MG PO TABS
40.0000 mg | ORAL_TABLET | Freq: Every day | ORAL | Status: DC
  Administered 2016-09-23: 40 mg via ORAL
  Filled 2016-09-23: qty 4

## 2016-09-23 MED ORDER — TRAZODONE HCL 100 MG PO TABS: 100.0000 mg | ORAL_TABLET | Freq: Every day | ORAL | Status: DC

## 2016-09-23 MED ORDER — INSULIN GLARGINE 100 UNIT/ML ~~LOC~~ SOLN
37.0000 [IU] | Freq: Every day | SUBCUTANEOUS | Status: DC
  Filled 2016-09-23: qty 0.37

## 2016-09-23 MED ORDER — LURASIDONE HCL 40 MG PO TABS
40.0000 mg | ORAL_TABLET | Freq: Every day | ORAL | Status: DC
  Administered 2016-09-24: 40 mg via ORAL
  Filled 2016-09-23: qty 1

## 2016-09-23 MED ORDER — HYDRALAZINE HCL 50 MG PO TABS
25.0000 mg | ORAL_TABLET | Freq: Three times a day (TID) | ORAL | Status: DC
  Administered 2016-09-24 – 2016-10-03 (×29): 25 mg via ORAL
  Filled 2016-09-23 (×13): qty 1
  Filled 2016-09-23: qty 2
  Filled 2016-09-23 (×11): qty 1
  Filled 2016-09-23: qty 2
  Filled 2016-09-23 (×5): qty 1

## 2016-09-23 MED ORDER — INSULIN ASPART 100 UNIT/ML ~~LOC~~ SOLN
SUBCUTANEOUS | Status: AC
  Filled 2016-09-23: qty 1

## 2016-09-23 MED ORDER — METFORMIN HCL 500 MG PO TABS
1000.0000 mg | ORAL_TABLET | Freq: Two times a day (BID) | ORAL | Status: DC
  Administered 2016-09-24 – 2016-10-03 (×19): 1000 mg via ORAL
  Filled 2016-09-23 (×19): qty 2

## 2016-09-23 MED ORDER — TRAZODONE HCL 100 MG PO TABS
100.0000 mg | ORAL_TABLET | Freq: Every day | ORAL | Status: DC
  Administered 2016-09-23: 100 mg via ORAL
  Filled 2016-09-23: qty 1

## 2016-09-23 MED ORDER — VENLAFAXINE HCL ER 75 MG PO CP24
300.0000 mg | ORAL_CAPSULE | Freq: Every day | ORAL | Status: DC
  Administered 2016-09-24 – 2016-09-25 (×2): 300 mg via ORAL
  Filled 2016-09-23 (×2): qty 4

## 2016-09-23 MED ORDER — NICOTINE 21 MG/24HR TD PT24
21.0000 mg | MEDICATED_PATCH | Freq: Every day | TRANSDERMAL | Status: DC
  Administered 2016-09-24 – 2016-10-03 (×10): 21 mg via TRANSDERMAL
  Filled 2016-09-23 (×10): qty 1

## 2016-09-23 MED ORDER — MONTELUKAST SODIUM 10 MG PO TABS
10.0000 mg | ORAL_TABLET | Freq: Every day | ORAL | Status: DC
  Administered 2016-09-24 – 2016-10-02 (×9): 10 mg via ORAL
  Filled 2016-09-23 (×10): qty 1

## 2016-09-23 MED ORDER — MAGNESIUM HYDROXIDE 400 MG/5ML PO SUSP: 30.0000 mL | Freq: Every day | ORAL | Status: DC | PRN

## 2016-09-23 MED ORDER — LURASIDONE HCL 80 MG PO TABS: 40.0000 mg | ORAL_TABLET | Freq: Every day | ORAL | Status: DC

## 2016-09-23 MED ORDER — INSULIN ASPART 100 UNIT/ML ~~LOC~~ SOLN
10.0000 [IU] | Freq: Once | SUBCUTANEOUS | Status: AC
  Administered 2016-09-23: 10 [IU] via SUBCUTANEOUS
  Filled 2016-09-23: qty 10

## 2016-09-23 MED ORDER — INSULIN GLARGINE 100 UNIT/ML ~~LOC~~ SOLN
37.0000 [IU] | Freq: Every day | SUBCUTANEOUS | Status: DC
  Administered 2016-09-23: 37 [IU] via SUBCUTANEOUS
  Filled 2016-09-23: qty 0.37

## 2016-09-23 NOTE — Progress Notes (Signed)
Patient ID: Tony Cunningham, male   DOB: 13-Dec-1975, 41 y.o.   MRN: [id number] Admitted to unit after considered medically stable, appearance and conditions poorly kept, bushy and unkept beards; noticeable EPS -Tardive Dyskinesia: involuntary movement of mouth, no smacking of lips yet; chart review indicated that he received Symmetrel prior to admission to the unit; patient is stiff and has parkinson-like walk. Towels, wash cloth and toiletries provided, patient encouraged to take a shower after unit and room orientation.  Skin assessment and contraband search completed, Gross Skin Intact, no open wounds, no tattoo site, noticeable for a lot of body hair. No contraband found on patient and in his belongings.

## 2016-09-23 NOTE — Tx Team (Signed)
Initial Treatment Plan 09/23/2016 11:36 PM Tony Cunningham NOB:[id number]    PATIENT STRESSORS: Financial difficulties Medication change or noncompliance   PATIENT STRENGTHS: Motivation for treatment/growth   PATIENT IDENTIFIED PROBLEMS: Mood Instability  Suicidal Thoughts ---to jump in front of Traffic  Auditory Hallucination.Marland KitchenHearing voices telling me to jump in front of traffic.."  DM II               DISCHARGE CRITERIA:  Ability to meet basic life and health needs Improved stabilization in mood, thinking, and/or behavior Motivation to continue treatment in a less acute level of care  PRELIMINARY DISCHARGE PLAN: Outpatient therapy Return to previous living arrangement  PATIENT/FAMILY INVOLVEMENT: This treatment plan has been presented to and reviewed with the patient, Tony Cunningham.  The patient have been given the opportunity to ask questions and make suggestions.  Cleotis Nipper, RN 09/23/2016, 11:36 PM

## 2016-09-23 NOTE — ED Notes (Signed)
ekg was completed and given to dr .Sharma Covert

## 2016-09-23 NOTE — ED Notes (Signed)
PT IVC PENDING PSYCH CONSULT. 

## 2016-09-23 NOTE — BH Assessment (Addendum)
TTS interview complete. Dr. Toni Amend recommends inpatient admission pending medical clearance. Unit Charge RN [facility]) provided with pt MRN. Pt chart pending review for possible placement with [facility].

## 2016-09-23 NOTE — ED Notes (Signed)
Pt given glass of ice water 

## 2016-09-23 NOTE — BHH Counselor (Signed)
Patient is to be admitted to [facility] by Dr. Toni Amend.  Attending Physician will be Dr. Ardyth Harps.   Patient has been assigned to room 314, by [facility] Charge Nurse Gwen.   Intake Paper Work has been signed and placed on patient chart.  ER staff is aware of the admission.

## 2016-09-23 NOTE — ED Notes (Signed)
PT IVC/SEEN BY DR [name]/RECOMMENDS PSYCHIATRIC INPATIENT ADMISSION WHEN MEDICALLY CLEARED. 

## 2016-09-23 NOTE — Consult Note (Signed)
[facility] Face-to-Face Psychiatry Consult   Reason for Consult:  Consult for 41 year old man with a history of chronic mental health problems who presented to the emergency room reporting suicidal thoughts. Referring Physician:  Mariea Clonts Patient Identification: Tony Cunningham MRN:  [id number] Principal Diagnosis: Schizoaffective disorder [facility]) Diagnosis:   Patient Active Problem List   Diagnosis Date Noted  . Suicidal ideation [R45.851] 09/23/2016  . Auditory hallucinations [R44.0]   . Polysubstance abuse [F19.10] 07/17/2016  . Tardive dyskinesia [G24.01] 07/16/2016  . Tobacco use disorder [F17.200] 07/07/2016  . Dyslipidemia [E78.5] 07/07/2016  . Asthma [J45.909] 07/07/2016  . Schizoaffective disorder ([place]) [F25.9] 07/07/2016  . HTN (hypertension) [I10] 07/06/2016  . Diabetes ([place]) [E11.9] 12/25/2010    Total Time spent with patient: 1 hour  Subjective:   Tony Cunningham is a 42 y.o. male patient admitted with "the voices are really bad".  HPI:  Patient interviewed chart reviewed. Patient reports that he's been having loud auditory hallucinations that have been much worse for the past 2 days. Haven't been able to sleep for the last few days. Mood is feeling depressed and agitated. He says he is having active thoughts about killing himself. Yesterday he considered jumping into upon to drown himself in today thought about walking out into traffic. He says the voices tell him that he should go and kill himself. Mood feels very sad depressed and upset also somewhat angry although without any homicidal ideation. Patient claims to be compliant with all of his medicine. Denies that he's been abusing drugs or alcohol. Doesn't report any specific new psychosocial stress.  Medical history: Patient has diabetes requiring insulin. His blood sugar on presentation here was over 300 and appears to be poorly controlled. Also history of high blood pressure chronic neck pain  Social history: Patient resides in a group  home is been there for about a month or 2. Doesn't like it. Says he doesn't stay in very close contact with family medical members. Closest family member is a mother [employer] cabin crew.  Substance abuse history: The old chart carries a diagnosis of polysubstance abuse but the patient denies he's been using alcohol or drugs and denies any past history of it and I can't find much detail.  Past Psychiatric History: Patient has a history of chronic mental health problems carries a diagnosis of either severe psychotic depression or schizoaffective disorder. Has had multiple hospitalizations going back a few years. Doesn't appear to of really ever fully recovered. He does have a past history of self injury and suicide attempts. He's been on multiple antipsychotics and antidepressants with only transient improvement. Currently appears to be on Zyprexa which could be part of why his blood sugar is so very high.  Risk to Self: Is patient at risk for suicide?: Yes Risk to Others:   Prior Inpatient Therapy:   Prior Outpatient Therapy:    Past Medical History:  Past Medical History:  Diagnosis Date  . Anxiety   . Asthma   . Diabetes mellitus   . High blood pressure   . Sinus complaint    History reviewed. No pertinent surgical history. Family History: No family history on file. Family Psychiatric  History: He does not know of any family history of mental health problems Social History:  History  Alcohol Use No     History  Drug Use No    Social History   Social History  . Marital status: Single    Spouse name: [name]  . Number of children: [name]  . Years  of education: [name]   Social History Main Topics  . Smoking status: Current Every Day Smoker    Packs/day: 0.50    Types: Cigarettes  . Smokeless tobacco: Never Used  . Alcohol use No  . Drug use: No  . Sexual activity: Not Currently   Other Topics Concern  . None   Social History Narrative  . None   Additional Social History:    Allergies:   No Known Allergies  Labs:  Results for orders placed or performed during the hospital encounter of 09/23/16 (from the past 48 hour(s))  Comprehensive metabolic panel     Status: Abnormal   Collection Time: 09/23/16 11:19 AM  Result Value Ref Range   Sodium 131 (L) 135 - 145 mmol/L   Potassium 4.3 3.5 - 5.1 mmol/L   Chloride 91 (L) 101 - 111 mmol/L   CO2 27 22 - 32 mmol/L   Glucose, Bld 396 (H) 65 - 99 mg/dL   BUN 10 6 - 20 mg/dL   Creatinine, Ser 0.69 0.61 - 1.24 mg/dL   Calcium 9.0 8.9 - 10.3 mg/dL   Total Protein 6.5 6.5 - 8.1 g/dL   Albumin 4.0 3.5 - 5.0 g/dL   AST 22 15 - 41 U/L   ALT 19 17 - 63 U/L   Alkaline Phosphatase 68 38 - 126 U/L   Total Bilirubin 0.6 0.3 - 1.2 mg/dL   GFR calc non Af Amer >60 >60 mL/min   GFR calc Af Amer >60 >60 mL/min    Comment: (NOTE) The eGFR has been calculated using the CKD EPI equation. This calculation has not been validated in all clinical situations. eGFR's persistently <60 mL/min signify possible Chronic Kidney Disease.    Anion gap 13 5 - 15  Ethanol     Status: None   Collection Time: 09/23/16 11:19 AM  Result Value Ref Range   Alcohol, Ethyl (B) <5 <5 mg/dL    Comment:        LOWEST DETECTABLE LIMIT FOR SERUM ALCOHOL IS 5 mg/dL FOR MEDICAL PURPOSES ONLY   Salicylate level     Status: None   Collection Time: 09/23/16 11:19 AM  Result Value Ref Range   Salicylate Lvl <9.3 2.8 - 30.0 mg/dL  Acetaminophen level     Status: Abnormal   Collection Time: 09/23/16 11:19 AM  Result Value Ref Range   Acetaminophen (Tylenol), Serum <10 (L) 10 - 30 ug/mL    Comment:        THERAPEUTIC CONCENTRATIONS VARY SIGNIFICANTLY. A RANGE OF 10-30 ug/mL MAY BE AN EFFECTIVE CONCENTRATION FOR MANY PATIENTS. HOWEVER, SOME ARE BEST TREATED AT CONCENTRATIONS OUTSIDE THIS RANGE. ACETAMINOPHEN CONCENTRATIONS >150 ug/mL AT 4 HOURS AFTER INGESTION AND >50 ug/mL AT 12 HOURS AFTER INGESTION ARE OFTEN ASSOCIATED WITH TOXIC REACTIONS.   cbc      Status: None   Collection Time: 09/23/16 11:19 AM  Result Value Ref Range   WBC 8.5 3.8 - 10.6 K/uL   RBC 5.17 4.40 - 5.90 MIL/uL   Hemoglobin 16.4 13.0 - 18.0 g/dL   HCT 47.0 40.0 - 52.0 %   MCV 91.0 80.0 - 100.0 fL   MCH 31.8 26.0 - 34.0 pg   MCHC 34.9 32.0 - 36.0 g/dL   RDW 13.1 11.5 - 14.5 %   Platelets 215 150 - 440 K/uL  Urine Drug Screen, Qualitative     Status: None   Collection Time: 09/23/16 11:19 AM  Result Value Ref Range   Tricyclic, Ur  Screen NONE DETECTED NONE DETECTED   Amphetamines, Ur Screen NONE DETECTED NONE DETECTED   MDMA (Ecstasy)Ur Screen NONE DETECTED NONE DETECTED   Cocaine Metabolite,Ur [name] NONE DETECTED NONE DETECTED   Opiate, Ur Screen NONE DETECTED NONE DETECTED   Phencyclidine (PCP) Ur S NONE DETECTED NONE DETECTED   Cannabinoid 50 Ng, Ur [place] NONE DETECTED NONE DETECTED   Barbiturates, Ur Screen NONE DETECTED NONE DETECTED   Benzodiazepine, Ur Scrn NONE DETECTED NONE DETECTED   Methadone Scn, Ur NONE DETECTED NONE DETECTED    Comment: (NOTE) 546  Tricyclics, urine               Cutoff 1000 ng/mL 200  Amphetamines, urine             Cutoff 1000 ng/mL 300  MDMA (Ecstasy), urine           Cutoff 500 ng/mL 400  Cocaine Metabolite, urine       Cutoff 300 ng/mL 500  Opiate, urine                   Cutoff 300 ng/mL 600  Phencyclidine (PCP), urine      Cutoff 25 ng/mL 700  Cannabinoid, urine              Cutoff 50 ng/mL 800  Barbiturates, urine             Cutoff 200 ng/mL 900  Benzodiazepine, urine           Cutoff 200 ng/mL 1000 Methadone, urine                Cutoff 300 ng/mL 1100 1200 The urine drug screen provides only a preliminary, unconfirmed 1300 analytical test result and should not be used for non-medical 1400 purposes. Clinical consideration and professional judgment should 1500 be applied to any positive drug screen result due to possible 1600 interfering substances. A more specific alternate chemical method 1700 must be used in order to  obtain a confirmed analytical result.  1800 Gas chromato graphy / mass spectrometry (GC/MS) is the preferred 1900 confirmatory method.   Urinalysis, Complete w Microscopic     Status: Abnormal   Collection Time: 09/23/16 11:19 AM  Result Value Ref Range   Color, Urine YELLOW (A) YELLOW   APPearance CLEAR (A) CLEAR   Specific Gravity, Urine 1.012 1.005 - 1.030   pH 6.0 5.0 - 8.0   Glucose, UA >=500 (A) NEGATIVE mg/dL   Hgb urine dipstick NEGATIVE NEGATIVE   Bilirubin Urine NEGATIVE NEGATIVE   Ketones, ur 20 (A) NEGATIVE mg/dL   Protein, ur 30 (A) NEGATIVE mg/dL   Nitrite NEGATIVE NEGATIVE   Leukocytes, UA NEGATIVE NEGATIVE   RBC / HPF 0-5 0 - 5 RBC/hpf   WBC, UA 0-5 0 - 5 WBC/hpf   Bacteria, UA FEW (A) NONE SEEN   Squamous Epithelial / LPF NONE SEEN NONE SEEN   Mucous PRESENT    Budding Yeast PRESENT    Sperm, UA PRESENT   Glucose, capillary     Status: Abnormal   Collection Time: 09/23/16 12:16 PM  Result Value Ref Range   Glucose-Capillary 322 (H) 65 - 99 mg/dL  Glucose, capillary     Status: Abnormal   Collection Time: 09/23/16  1:23 PM  Result Value Ref Range   Glucose-Capillary 349 (H) 65 - 99 mg/dL    Current Facility-Administered Medications  Medication Dose Route Frequency Provider Last Rate Last Dose  . amantadine (SYMMETREL) capsule 100 mg  100 mg Oral BID Gonzella Lex, MD      . hydrALAZINE (APRESOLINE) tablet 25 mg  25 mg Oral Q8H Royann Wildasin T Annel Zunker, MD      . insulin aspart (novoLOG) injection 0-15 Units  0-15 Units Subcutaneous TID WC Gonzella Lex, MD      . insulin glargine (LANTUS) injection 37 Units  37 Units Subcutaneous QHS Gonzella Lex, MD      . lisinopril (PRINIVIL,ZESTRIL) tablet 20 mg  20 mg Oral Daily Gonzella Lex, MD      . Derrill Memo ON 09/24/2016] lurasidone (LATUDA) tablet 40 mg  40 mg Oral Q breakfast Gonzella Lex, MD      . metFORMIN (GLUCOPHAGE) tablet 1,000 mg  1,000 mg Oral BID WC Gonzella Lex, MD      . montelukast (SINGULAIR) tablet 10  mg  10 mg Oral QHS Gonzella Lex, MD      . simvastatin (ZOCOR) tablet 40 mg  40 mg Oral q1800 Gonzella Lex, MD      . traZODone (DESYREL) tablet 100 mg  100 mg Oral QHS Gonzella Lex, MD      . Derrill Memo ON 09/24/2016] venlafaxine XR (EFFEXOR-XR) 24 hr capsule 300 mg  300 mg Oral Q breakfast Gonzella Lex, MD       Current Outpatient Prescriptions  Medication Sig Dispense Refill  . amantadine (SYMMETREL) 100 MG capsule Take 1 capsule (100 mg total) by mouth 2 (two) times daily. 60 capsule 0  . canagliflozin (INVOKANA) 300 MG TABS tablet Take 300 mg by mouth daily before breakfast.    . hydrALAZINE (APRESOLINE) 25 MG tablet Take 25 mg by mouth 3 (three) times daily.    . insulin glargine (LANTUS) 100 UNIT/ML injection Inject 0.37 mLs (37 Units total) into the skin at bedtime. (Patient taking differently: Inject 22 Units into the skin at bedtime. ) 11 mL 0  . lisinopril-hydrochlorothiazide (PRINZIDE,ZESTORETIC) 20-25 MG tablet Take 1 tablet by mouth daily. For high blood pressure 30 tablet 0  . metFORMIN (GLUCOPHAGE) 1000 MG tablet Take 1 tablet (1,000 mg total) by mouth 2 (two) times daily with a meal. 60 tablet 0  . montelukast (SINGULAIR) 10 MG tablet Take 1 tablet (10 mg total) by mouth at bedtime. For Asthma 30 tablet 0  . OLANZapine (ZYPREXA) 20 MG tablet Take 1 tablet (20 mg total) by mouth at bedtime. 30 tablet 0  . simvastatin (ZOCOR) 40 MG tablet Take 1 tablet (40 mg total) by mouth at bedtime. For high cholesterol 15 tablet 0  . traZODone (DESYREL) 100 MG tablet Take 1 tablet (100 mg total) by mouth at bedtime. (Patient not taking: Reported on 07/24/2016) 30 tablet 0  . traZODone (DESYREL) 150 MG tablet Take 150 mg by mouth at bedtime.    Marland Kitchen venlafaxine XR (EFFEXOR-XR) 150 MG 24 hr capsule Take 2 capsules (300 mg total) by mouth daily with breakfast. (Patient taking differently: Take 150 mg by mouth daily with breakfast. ) 60 capsule 0    Musculoskeletal: Strength & Muscle Tone: within  normal limits Gait & Station: normal Patient leans: [name]  Psychiatric Specialty Exam: Physical Exam  Nursing note and vitals reviewed. Constitutional: He appears well-developed and well-nourished.  HENT:  Head: Normocephalic and atraumatic.  Eyes: Conjunctivae are normal. Pupils are equal, round, and reactive to light.  Neck: Normal range of motion.  Cardiovascular: Regular rhythm and normal heart sounds.   Respiratory: Effort normal and breath sounds normal. No respiratory  distress.  GI: Soft.  Musculoskeletal: Normal range of motion.  Neurological: He is alert.  Skin: Skin is warm and dry.  Psychiatric: His affect is blunt. His speech is delayed. He is slowed and withdrawn. He expresses impulsivity and inappropriate judgment. He exhibits a depressed mood. He expresses suicidal ideation. He expresses suicidal plans. He exhibits abnormal recent memory.    Review of Systems  Constitutional: Negative.   HENT: Negative.   Eyes: Negative.   Respiratory: Negative.   Cardiovascular: Negative.   Gastrointestinal: Negative.   Musculoskeletal: Negative.   Skin: Negative.   Neurological: Negative.   Psychiatric/Behavioral: Positive for depression, hallucinations, memory loss and suicidal ideas. Negative for substance abuse. The patient is nervous/anxious and has insomnia.     Blood pressure 117/73, pulse 91, temperature 98.1 F (36.7 C), temperature source Oral, resp. rate 16, height '5\' 7"'  (1.702 m), weight 80.7 kg (178 lb), SpO2 96 %.Body mass index is 27.88 kg/m.  General Appearance: Casual  Eye Contact:  Minimal  Speech:  Slow  Volume:  Decreased  Mood:  Depressed  Affect:  Constricted  Thought Process:  Goal Directed  Orientation:  Full (Time, Place, and Person)  Thought Content:  Rumination and Tangential  Suicidal Thoughts:  Yes.  with intent/plan  Homicidal Thoughts:  Yes.  without intent/plan  Memory:  Immediate;   Fair Recent;   Fair Remote;   Fair  Judgement:  Fair   Insight:  Fair  Psychomotor Activity:  Decreased  Concentration:  Concentration: Fair  Recall:  [employer] of Knowledge:  Fair  Language:  Fair  Akathisia:  No  Handed:  Right  AIMS (if indicated):     Assets:  Desire for Improvement Housing Social Support  ADL's:  Impaired  Cognition:  Impaired,  Mild  Sleep:        Treatment Plan Summary: Daily contact with patient to assess and evaluate symptoms and progress in treatment, Medication management and Plan 41 year old man with a history of chronic mental health problems presents to the emergency room claiming that for the past 2 days his symptoms have been much worse. He says he's hearing voices that tell him to kill himself. He describes active thoughts and plans about trying to kill himself. Patient's mood is sad and depressed he is very withdrawn and isolated. Unclear if he may of been medicine noncompliant but his blood sugars very high. Case reviewed with emergency room doctor and TTS. Patient will ultimately need inpatient psychiatric hospitalization. They're going to try and get his blood sugars down a little bit here in the emergency room. I have restarted outpatient medication but I discontinued the Zyprexa because of concern about his blood sugar. In any case it doesn't seem like it was helping. Replace it with Latuda 40 mg a day. Patient will be admitted down stairs with full set of labs for precautions.  Disposition: Recommend psychiatric Inpatient admission when medically cleared. Supportive therapy provided about ongoing stressors. Discussed crisis plan, support from social network, calling 911, coming to the Emergency Department, and calling Suicide Hotline.  Alethia Berthold, MD 09/23/2016 2:43 PM

## 2016-09-23 NOTE — ED Triage Notes (Signed)
Arrives with mobile crisis. History of hearing voices, now telling him to harm himself. States has had thoughts of harming self, would run in traffic or drown self.

## 2016-09-23 NOTE — ED Provider Notes (Signed)
[facility] Emergency Department Provider Note  ____________________________________________  Time seen: Approximately 12:25 PM  I have reviewed the triage vital signs and the nursing notes.   HISTORY  Chief Complaint Psychiatric Evaluation    HPI Tony Cunningham is a 41 y.o. male with a history of schizoaffective disorder, polysubstance abuse, tardive dyskinesia, presenting for auditory hallucinations per the patient reports that he has been hearing voices "telling me to get run over." He has some chronic neck pain but otherwise denies any acute medical complaints. On arrival to the emergency department his blood sugar is 396. He denies any infectious symptoms. He states he has been taking his metformin, last dose this morning.   Past Medical History:  Diagnosis Date  . Anxiety   . Asthma   . Diabetes mellitus   . High blood pressure   . Sinus complaint     Patient Active Problem List   Diagnosis Date Noted  . Auditory hallucinations   . Polysubstance abuse 07/17/2016  . Tardive dyskinesia 07/16/2016  . Tobacco use disorder 07/07/2016  . Dyslipidemia 07/07/2016  . Asthma 07/07/2016  . Schizoaffective disorder ([facility]) 07/07/2016  . HTN (hypertension) 07/06/2016  . Diabetes ([facility]) 12/25/2010    History reviewed. No pertinent surgical history.  Current Outpatient Rx  . Order #: [id number] Class: Print  . Order #: [id number] Class: Historical Med  . Order #: [id number] Class: Historical Med  . Order #: [id number] Class: Print  . Order #: [id number] Class: Normal  . Order #: [id number] Class: Print  . Order #: [id number] Class: Normal  . Order #: [id number] Class: Print  . Order #: [id number] Class: Normal  . Order #: [id number] Class: Print  . Order #: [id number] Class: Historical Med  . Order #: [id number] Class: Print    Allergies Patient has no known allergies.  No family history on file.  Social History Social History  Substance Use Topics  . Smoking  status: Current Every Day Smoker    Packs/day: 0.50    Types: Cigarettes  . Smokeless tobacco: Never Used  . Alcohol use No    Review of Systems Constitutional: No fever/chills. No lightheadedness or syncope. Eyes: No visual changes. No eye discharge. ENT: No sore throat. No congestion or rhinorrhea. No ear pain. Positive chronic neck pain that is unchanged. Cardiovascular: Denies chest pain. Denies palpitations. Respiratory: Denies shortness of breath.  No cough. Gastrointestinal: No abdominal pain.  No nausea, no vomiting.  No diarrhea.  No constipation. Tolerating food well. Genitourinary: Negative for dysuria. Musculoskeletal: Negative for back pain. Skin: Negative for rash. Neurological: Negative for headaches. No focal numbness, tingling or weakness.  Psychiatric:Auditory hallucinations. Denies SI, HI or visual hallucinations but Endocrine:Hyperglycemia. 10-point ROS otherwise negative.  ____________________________________________   PHYSICAL EXAM:  VITAL SIGNS: ED Triage Vitals [09/23/16 1059]  Enc Vitals Group     BP 103/63     Pulse Rate (!) 103     Resp 18     Temp 98.8 F (37.1 C)     Temp Source Oral     SpO2 96 %     Weight 178 lb (80.7 kg)     Height 5\' 7"  (1.702 m)     Head Circumference      Peak Flow      Pain Score      Pain Loc      Pain Edu?      Excl. in GC?     Constitutional: Alert and oriented. Well appearing and in no acute distress. Answers questions appropriately. Eyes: Conjunctivae are normal.  EOMI. No scleral icterus. Head: Atraumatic. Nose: No congestion/rhinnorhea. Mouth/Throat: Mucous membranes are moist.  Neck: No stridor.  Supple.  No meningismus. Full range of motion without significant pain. No midline C-spine tenderness to palpation, step-offs or deformities. Cardiovascular: Normal rate, regular rhythm. No murmurs, rubs or gallops.  Respiratory: Normal respiratory effort.  No accessory muscle use or retractions. Lungs CTAB.   No wheezes, rales or ronchi. Gastrointestinal: Soft, nontender and nondistended.  No guarding or rebound.  No peritoneal signs. Musculoskeletal: No LE edema.  Neurologic:  A&Ox3.  Speech is clear.  Face and smile are symmetric.  EOMI.  Moves all extremities well. Skin:  Skin is warm, dry and intact. No rash noted. Psychiatric: Depressed mood and flat affect. On my examination, the patient endorses auditory hallucinations which are telling him to kill himself. No HI or visual hallucinations.  ____________________________________________   LABS (all labs ordered are listed, but only abnormal results are displayed)  Labs Reviewed  COMPREHENSIVE METABOLIC PANEL - Abnormal; Notable for the following:       Result Value   Sodium 131 (*)    Chloride 91 (*)    Glucose, Bld 396 (*)    All other components within normal limits  ACETAMINOPHEN LEVEL - Abnormal; Notable for the following:    Acetaminophen (Tylenol), Serum <10 (*)    All other components within normal limits  GLUCOSE, CAPILLARY - Abnormal; Notable for the following:    Glucose-Capillary 322 (*)    All other components within normal limits  ETHANOL  SALICYLATE LEVEL  CBC  URINE DRUG SCREEN, QUALITATIVE ([facility] ONLY)   ____________________________________________  EKG  ED ECG REPORT I, Rockne Menghini, the attending physician, personally viewed and interpreted this ECG.   Date: 09/23/2016  EKG Time: 1313  Rate: 90  Rhythm: normal sinus rhythm  Axis: normal  Intervals:none  ST&T Change: No STEMI  ____________________________________________  RADIOLOGY  No results found.  ____________________________________________   PROCEDURES  Procedure(s) performed: None  Procedures  Critical Care performed: No ____________________________________________   INITIAL IMPRESSION / ASSESSMENT AND PLAN / ED COURSE  Pertinent labs & imaging results that were available during my care of the patient were reviewed by me  and considered in my medical decision making (see chart for details).  41 y.o. male with diabetes, schizoaffective disorder, presenting for auditory hallucinations time to kill himself. From a medical perspective, the patient does not have any acute symptoms, but he is found to be profoundly hyperglycemic without DKA today. He has a mild hyponatremia, which is likely from his hyperglycemia. We will encourage oral hydration, as he is able to tolerate by mouth. In addition, I'll give him subcutaneous insulin, and we will reevaluate his hyperglycemia. He has no signs or symptoms of other causes of hypoglycemia including infection today. Plan psychiatric disposition.  ----------------------------------------- 2:45 PM on 09/23/2016 -----------------------------------------  The patient's workup in the emergency department is reassuring. His hyperglycemia is improving and he is medically cleared for psychiatric disposition. He has been seen by the psychiatrist, who does feel that he warrants inpatient admission. ____________________________________________  FINAL CLINICAL IMPRESSION(S) / ED DIAGNOSES  Final diagnoses:  Hyperglycemia  Chronic neck pain  Hyponatremia  Auditory hallucinations         NEW MEDICATIONS STARTED DURING THIS VISIT:  New Prescriptions   No medications on file      Rockne Menghini, MD 09/23/16 1446

## 2016-09-23 NOTE — ED Notes (Signed)
CBG done. Then given food tray per Dr Sharma Covert.

## 2016-09-23 NOTE — BH Assessment (Signed)
Tele Assessment Note   Tony Cunningham is an 41 y.o. male. Pt has h/o schizoaffective d/o. Pt reports AH w/ command to harm self. Pt reports SI with plan to walk into traffic. Pt unable to contract for safety. Pt denis HI, command to harm others and access to weapons. Pt reports multiple depressive sxs. Pt denies substance use. Pt reports ongoing outpatient therapy. Clinician is unable to understand pt responses when asked name of therapist/facility. Pt reports decreased grooming and staying in bed more Pt reports sleep disturbance and typically receives 3hrs of sleep per night. Pt reports 4lb weight loss. Pt denies h/o abuse. Pt denies any difficulty performing ADLs.    Diagnosis: Schizoaffective d/o  Past Medical History:  Past Medical History:  Diagnosis Date  . Anxiety   . Asthma   . Diabetes mellitus   . High blood pressure   . Sinus complaint     History reviewed. No pertinent surgical history.  Family History: No family history on file.  Social History:  reports that he has been smoking Cigarettes.  He has been smoking about 0.50 packs per day. He has never used smokeless tobacco. He reports that he does not drink alcohol or use drugs.  Additional Social History:  Alcohol / Drug Use Pain Medications: Pt denies abuse. Prescriptions: Pt denies abuse. Over the Counter: Pt denies abuse. History of alcohol / drug use?: No history of alcohol / drug abuse  CIWA: CIWA-Ar BP: 117/73 Pulse Rate: 91 COWS:    PATIENT STRENGTHS: (choose at least two) Average or above average intelligence Supportive family/friends  Allergies: No Known Allergies  Home Medications:  (Not in a hospital admission)  OB/GYN Status:  No LMP for male patient.  General Assessment Data Location of Assessment: [facility] ED TTS Assessment: In system Is this a Tele or Face-to-Face Assessment?: Face-to-Face Is this an Initial Assessment or a Re-assessment for this encounter?: Initial Assessment Marital status:   (Not Reported) Is patient pregnant?: No Pregnancy Status: No Living Arrangements: Group Home Can pt return to current living arrangement?: Yes Admission Status: Voluntary Is patient capable of signing voluntary admission?: Yes Referral Source: Self/Family/Friend Insurance type: Medicare     Crisis Care Plan Living Arrangements: Group Home Name of Psychiatrist: Pt reports outpatient care Name of Therapist: Pt reports outpatient care  Education Status Is patient currently in school?: No Highest grade of school patient has completed: 11  Risk to self with the past 6 months Suicidal Ideation: Yes-Currently Present Has patient been a risk to self within the past 6 months prior to admission? : No Suicidal Intent: Yes-Currently Present Has patient had any suicidal intent within the past 6 months prior to admission? : No Is patient at risk for suicide?: Yes Suicidal Plan?: Yes-Currently Present Has patient had any suicidal plan within the past 6 months prior to admission? : No Specify Current Suicidal Plan: Walk into traffic Access to Means: Yes Specify Access to Suicidal Means: Access to traffic What has been your use of drugs/alcohol within the last 12 months?: Pt denies alcohol/drug use Previous Attempts/Gestures: Yes How many times?: 2 Other Self Harm Risks: AH w/ command to harm self Triggers for Past Attempts: Unknown Intentional Self Injurious Behavior: None Family Suicide History: Unknown Recent stressful life event(s): Other (Comment) (Psychosis) Persecutory voices/beliefs?: No Depression: Yes Depression Symptoms: Insomnia, Feeling angry/irritable Substance abuse history and/or treatment for substance abuse?: No Suicide prevention information given to non-admitted patients: Not applicable  Risk to Others within the past 6 months Homicidal  Ideation: No Does patient have any lifetime risk of violence toward others beyond the six months prior to admission? : No Thoughts  of Harm to Others: No Current Homicidal Intent: No Current Homicidal Plan: No Access to Homicidal Means: No History of harm to others?: No Does patient have access to weapons?: No Criminal Charges Pending?: No Does patient have a court date: No Is patient on probation?: No  Psychosis Hallucinations: Auditory, With command Delusions: None noted  Mental Status Report Appearance/Hygiene: In scrubs Eye Contact: Fair Motor Activity: Unremarkable Speech: Logical/coherent, Soft Level of Consciousness: Quiet/awake, Sleeping Mood: Depressed Affect: Depressed Anxiety Level: Minimal Thought Processes: Coherent, Relevant Judgement: Unimpaired Orientation: Person, Place, Time, Situation Obsessive Compulsive Thoughts/Behaviors: None  Cognitive Functioning Concentration: Normal Memory: Recent Intact, Remote Intact IQ: Average Insight: Good Impulse Control: Fair Appetite: Fair Weight Loss: 4 Weight Gain: 0 Sleep: Decreased Total Hours of Sleep: 3 Vegetative Symptoms: Decreased grooming, Staying in bed  ADLScreening [facility] Assessment Services) Patient's cognitive ability adequate to safely complete daily activities?: Yes Patient able to express need for assistance with ADLs?: Yes Independently performs ADLs?: Yes (appropriate for developmental age)  Prior Inpatient Therapy Prior Inpatient Therapy: Yes Prior Therapy Dates: Multiple Prior Therapy Facilty/Provider(s): [facility] Reason for Treatment: Schizoaffective d/o  Prior Outpatient Therapy Prior Outpatient Therapy: Yes Prior Therapy Dates: Ongoing Prior Therapy Facilty/Provider(s): [facility] Reason for Treatment: Schizoaffective d/o Does patient have an ACCT team?: No Does patient have Intensive In-House Services?  : No Does patient have [facility] services? : Unknown Does patient have P4CC services?: No  ADL Screening (condition at time of admission) Patient's cognitive ability adequate to safely complete daily activities?: Yes Is  the patient deaf or have difficulty hearing?: No Does the patient have difficulty seeing, even when wearing glasses/contacts?: No Does the patient have difficulty concentrating, remembering, or making decisions?: No Patient able to express need for assistance with ADLs?: Yes Does the patient have difficulty dressing or bathing?: No Independently performs ADLs?: Yes (appropriate for developmental age) Does the patient have difficulty walking or climbing stairs?: No Weakness of Legs: None Weakness of Arms/Hands: None  Home Assistive Devices/Equipment Home Assistive Devices/Equipment: None  Therapy Consults (therapy consults require a physician order) PT Evaluation Needed: No OT Evalulation Needed: No SLP Evaluation Needed: No Abuse/Neglect Assessment (Assessment to be complete while patient is alone) Physical Abuse: Denies Verbal Abuse: Denies Sexual Abuse: Denies Exploitation of patient/patient's resources: Denies Self-Neglect: Denies Values / Beliefs Cultural Requests During Hospitalization: None Spiritual Requests During Hospitalization: None Consults Spiritual Care Consult Needed: No Social Work Consult Needed: No Merchant navy officer (For Healthcare) Does Patient Have a Medical Advance Directive?: No Would patient like information on creating a medical advance directive?: No - Patient declined Nutrition Screen- [facility] Adult/[facility]/AP Patient's home diet: Regular  Additional Information 1:1 In Past 12 Months?: No CIRT Risk: No Elopement Risk: No Does patient have medical clearance?: No     Disposition:  Disposition Initial Assessment Completed for this Encounter: Yes Disposition of Patient: Inpatient treatment program Type of inpatient treatment program: Adult (Pending medical clearance per Dr. Toni Amend)  Tony Cunningham J [place] 09/23/2016 6:11 PM

## 2016-09-24 ENCOUNTER — Encounter: Payer: Self-pay | Admitting: Psychiatry

## 2016-09-24 LAB — BASIC METABOLIC PANEL
Anion gap: 9 (ref 5–15)
BUN: 8 mg/dL (ref 6–20)
CO2: 28 mmol/L (ref 22–32)
CREATININE: 0.49 mg/dL — AB (ref 0.61–1.24)
Calcium: 8.7 mg/dL — ABNORMAL LOW (ref 8.9–10.3)
Chloride: 98 mmol/L — ABNORMAL LOW (ref 101–111)
GFR calc Af Amer: >60 mL/min (ref >60)
Glucose, Bld: 330 mg/dL — ABNORMAL HIGH (ref 65–99)
Potassium: 3.8 mmol/L (ref 3.5–5.1)
SODIUM: 135 mmol/L (ref 135–145)

## 2016-09-24 LAB — GLUCOSE, CAPILLARY
GLUCOSE-CAPILLARY: 234 mg/dL — AB (ref 65–99)
Glucose-Capillary: 347 mg/dL — ABNORMAL HIGH (ref 65–99)
Glucose-Capillary: 217 mg/dL — ABNORMAL HIGH (ref 65–99)
Glucose-Capillary: 266 mg/dL — ABNORMAL HIGH (ref 65–99)

## 2016-09-24 LAB — TSH: TSH: 1.689 u[IU]/mL (ref 0.350–4.500)

## 2016-09-24 MED ORDER — INSULIN ASPART 100 UNIT/ML ~~LOC~~ SOLN
0.0000 [IU] | Freq: Every day | SUBCUTANEOUS | Status: DC
  Administered 2016-09-24: 2 [IU] via SUBCUTANEOUS
  Administered 2016-09-25: 4 [IU] via SUBCUTANEOUS
  Administered 2016-09-26: 3 [IU] via SUBCUTANEOUS
  Administered 2016-09-28: 5 [IU] via SUBCUTANEOUS
  Administered 2016-09-29: 3 [IU] via SUBCUTANEOUS
  Administered 2016-09-30: 4 [IU] via SUBCUTANEOUS
  Filled 2016-09-24: qty 3
  Filled 2016-09-24: qty 2
  Filled 2016-09-24: qty 3
  Filled 2016-09-24: qty 5

## 2016-09-24 MED ORDER — INSULIN ASPART 100 UNIT/ML ~~LOC~~ SOLN
0.0000 [IU] | Freq: Three times a day (TID) | SUBCUTANEOUS | Status: DC
  Administered 2016-09-24: 5 [IU] via SUBCUTANEOUS
  Administered 2016-09-24: 8 [IU] via SUBCUTANEOUS
  Administered 2016-09-25: 11 [IU] via SUBCUTANEOUS
  Administered 2016-09-25: 2 [IU] via SUBCUTANEOUS
  Administered 2016-09-25: 5 [IU] via SUBCUTANEOUS
  Administered 2016-09-26: 2 [IU] via SUBCUTANEOUS
  Administered 2016-09-26: 11 [IU] via SUBCUTANEOUS
  Administered 2016-09-27: 15 [IU] via SUBCUTANEOUS
  Administered 2016-09-27: 3 [IU] via SUBCUTANEOUS
  Administered 2016-09-27: 2 [IU] via SUBCUTANEOUS
  Administered 2016-09-28 (×2): 3 [IU] via SUBCUTANEOUS
  Administered 2016-09-29: 11 [IU] via SUBCUTANEOUS
  Administered 2016-09-29: 3 [IU] via SUBCUTANEOUS
  Administered 2016-09-29 – 2016-09-30 (×2): 15 [IU] via SUBCUTANEOUS
  Administered 2016-09-30: 8 [IU] via SUBCUTANEOUS
  Administered 2016-09-30 – 2016-10-01 (×2): 3 [IU] via SUBCUTANEOUS
  Administered 2016-10-01: 5 [IU] via SUBCUTANEOUS
  Administered 2016-10-02: 8 [IU] via SUBCUTANEOUS
  Administered 2016-10-02 (×2): 3 [IU] via SUBCUTANEOUS
  Administered 2016-10-03: 8 [IU] via SUBCUTANEOUS
  Filled 2016-09-24: qty 15
  Filled 2016-09-24 (×3): qty 3
  Filled 2016-09-24 (×2): qty 11
  Filled 2016-09-24: qty 4
  Filled 2016-09-24: qty 5
  Filled 2016-09-24 (×2): qty 2
  Filled 2016-09-24: qty 3
  Filled 2016-09-24: qty 11
  Filled 2016-09-24: qty 15
  Filled 2016-09-24: qty 5
  Filled 2016-09-24: qty 8
  Filled 2016-09-24: qty 7
  Filled 2016-09-24: qty 3
  Filled 2016-09-24: qty 5
  Filled 2016-09-24: qty 8

## 2016-09-24 MED ORDER — INSULIN GLARGINE 100 UNIT/ML ~~LOC~~ SOLN
41.0000 [IU] | Freq: Every day | SUBCUTANEOUS | Status: DC
  Administered 2016-09-24 – 2016-09-30 (×7): 41 [IU] via SUBCUTANEOUS
  Filled 2016-09-24 (×8): qty 0.41

## 2016-09-24 MED ORDER — DIPHENHYDRAMINE HCL 25 MG PO CAPS
50.0000 mg | ORAL_CAPSULE | Freq: Every day | ORAL | Status: DC
  Administered 2016-09-24: 50 mg via ORAL
  Filled 2016-09-24: qty 2

## 2016-09-24 MED ORDER — LAMOTRIGINE 25 MG PO TABS
25.0000 mg | ORAL_TABLET | Freq: Every day | ORAL | Status: DC
  Administered 2016-09-24 – 2016-09-30 (×7): 25 mg via ORAL
  Filled 2016-09-24 (×7): qty 1

## 2016-09-24 MED ORDER — ZIPRASIDONE HCL 40 MG PO CAPS
60.0000 mg | ORAL_CAPSULE | Freq: Two times a day (BID) | ORAL | Status: DC
  Administered 2016-09-24 – 2016-09-25 (×2): 60 mg via ORAL
  Filled 2016-09-24 (×2): qty 1

## 2016-09-24 NOTE — [facility]HH Suicide Risk Assessment (Signed)
[facility] Admission Suicide Risk Assessment   Nursing information obtained from:  Patient, Review of record Demographic factors:  Male, Caucasian, Low socioeconomic status, Unemployed Current Mental Status:  Suicide plan, Self-harm thoughts, Intention to act on suicide plan Loss Factors:  Financial problems / change in socioeconomic status Historical Factors:  Prior suicide attempts Risk Reduction Factors:  NA  Total Time spent with patient: 1 hour Principal Problem: Schizoaffective disorder, depressive type (HCC) Diagnosis:   Patient Active Problem List   Diagnosis Date Noted  . Suicidal ideation [R45.851] 09/23/2016  . Schizoaffective disorder, depressive type (HCC) [F25.1] 09/23/2016  . Polysubstance abuse [F19.10] 07/17/2016  . Tardive dyskinesia [G24.01] 07/16/2016  . Tobacco use disorder [F17.200] 07/07/2016  . Dyslipidemia [E78.5] 07/07/2016  . Asthma [J45.909] 07/07/2016  . HTN (hypertension) [I10] 07/06/2016  . Diabetes (HCC) [E11.9] 12/25/2010   Subjective Data:   Continued Clinical Symptoms:  Alcohol Use Disorder Identification Test Final Score (AUDIT): 0 The "Alcohol Use Disorders Identification Test", Guidelines for Use in Primary Care, Second Edition.  World Science writer [facility]). Score between 0-7:  no or low risk or alcohol related problems. Score between 8-15:  moderate risk of alcohol related problems. Score between 16-19:  high risk of alcohol related problems. Score 20 or above:  warrants further diagnostic evaluation for alcohol dependence and treatment.   CLINICAL FACTORS:   Depression:   Impulsivity Insomnia More than one psychiatric diagnosis Previous Psychiatric Diagnoses and Treatments Medical Diagnoses and Treatments/Surgeries     Psychiatric Specialty Exam: Physical Exam  ROS  Blood pressure (!) 100/58, pulse 79, temperature 98.1 F (36.7 C), temperature source Oral, resp. rate 18, height 5\' 8"  (1.727 m), weight 80.7 kg (178 lb), SpO2 98  %.Body mass index is 27.06 kg/m.                                                    Sleep:  Number of Hours: 4      COGNITIVE FEATURES THAT CONTRIBUTE TO RISK:  Loss of executive function    SUICIDE RISK:   Moderate:  Frequent suicidal ideation with limited intensity, and duration, some specificity in terms of plans, no associated intent, good self-control, limited dysphoria/symptomatology, some risk factors present, and identifiable protective factors, including available and accessible social support.  PLAN OF CARE: admit to [facility]  I certify that inpatient services furnished can reasonably be expected to improve the patient's condition.   [facility], MD 09/24/2016, 11:24 AM

## 2016-09-24 NOTE — Plan of Care (Signed)
Problem: Activity: Goal: Sleeping patterns will improve Outcome: Progressing Patient slept for Estimated Hours of 4; every 15 minutes safety round maintained, no injury or falls during this shift.

## 2016-09-24 NOTE — Progress Notes (Signed)
Recreation Therapy Notes  Date: 05.03.18 Time: 9:30 am Location: Craft Room  Group Topic: Leisure Education  Goal Area(s) Addresses:  Patient will identify things they are grateful for. Patient will identify how being grateful can influence decision making.  Behavioral Response: Did not attend  Intervention: Grateful Wheel  Activity: Patients were given an I Am Grateful For worksheet and were instructed to write things they were grateful for under each category.  Education: LRT educated patients on leisure.  Education Outcome: Patient did not attend group.  Clinical Observations/Feedback: Patient did not attend group.  Jacquelynn Cree, LRT/CTRS 09/24/2016 10:13 AM

## 2016-09-24 NOTE — Plan of Care (Signed)
Problem: Medication: Goal: Compliance with prescribed medication regimen will improve Outcome: Progressing Pt is compliant with medications.
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
## 2016-09-24 NOTE — Progress Notes (Signed)
Recreation Therapy Notes  INPATIENT RECREATION THERAPY ASSESSMENT  Patient Details Name: Tony Cunningham MRN: [id number] DOB: Jan 25, 1976 Today's Date: 09/24/2016  Patient Stressors: Other (Comment) (Group home - lots of arguments)  Coping Skills:   Arguments, Avoidance, Exercise, Art/Dance, Talking, Music, Sports, Other (Comment) (Breathing)  Personal Challenges: Anger, Communication, Concentration, Decision-Making, Expressing Yourself, Relationships, Self-Esteem/Confidence, Stress Management, Trusting Others  Leisure Interests (2+):  Games - Video games, Exercise - Walking  Awareness of Community Resources:  No  Community Resources:     Current Use:    If no, Barriers?:    Patient Strengths:  Good at drawing, quiet  Patient Identified Areas of Improvement:  'I don't know"  Current Recreation Participation:  Nothing  Patient Goal for Hospitalization:  To get better  [place] of Residence:  [place] of Residence:  [place]   Current SI (including self-harm):  No  Current HI:  No  Consent to Intern Participation: [name]   Jacquelynn Cree, LRT/CTRS 09/24/2016, 4:26 PM

## 2016-09-24 NOTE — [facility] Group Notes (Signed)
[facility] LCSW Group Therapy  09/24/2016 3:13 PM  Type of Therapy:  Group Therapy  Participation Level:  Patient did not attend group. CSW invited patient to group.   Summary of Progress/Problems: Balance in life: Patients will discuss the concept of balance and how it looks and feels to be unbalanced. Pt will identify areas in their life that is unbalanced and ways to become more balanced. They discussed what aspects in their lives has influenced their self care. Patients also discussed self care in the areas of self regulation/control, hygiene/appearance, sleep/relaxation, healthy leisure, healthy eating habits, exercise, inner peace/spirituality, self improvement, sobriety, and health management. They were challenged to identify changes that are needed in order to improve self care.  Aquil Duhe G. Garnette Czech MSW, LCSWA 09/24/2016, 3:15 PM

## 2016-09-24 NOTE — Progress Notes (Signed)
Inpatient Diabetes Program Recommendations  AACE/ADA: New Consensus Statement on Inpatient Glycemic Control (2015)  Target Ranges:  Prepandial:   less than 140 mg/dL      Peak postprandial:   less than 180 mg/dL (1-2 hours)      Critically ill patients:  140 - 180 mg/dL   Lab Results  Component Value Date   GLUCAP 347 (H) 09/24/2016   HGBA1C 12.8 (H) 07/07/2016    Review of Glycemic Control  Results for [place], BLOOR (MRN [id number]) as of 09/24/2016 07:50  Ref. Range 09/23/2016 12:16 09/23/2016 13:23 09/23/2016 17:07 09/23/2016 21:53 09/24/2016 06:47  Glucose-Capillary Latest Ref Range: 65 - 99 mg/dL 157 (H) 262 (H) 035 (H) 335 (H) 347 (H)     Diabetes history: Type 2 Outpatient Diabetes medications: Lantus 37 units qhs, Metformin 1000mg  bid  Current orders for Inpatient glycemic control: Lantus 37 units qhs, Metformin 1000mg  bid, Novolog 0-15 units tid  Inpatient Diabetes Program Recommendations:     Fasting blood sugar elevated- consider increasing Lantus to 41 units qhs (10% increase from current dose).  Consider adding Novolog 0-5 units qhs   , RN, , Susette Racer, CDE Diabetes Coordinator Inpatient Diabetes Program  [phone] (Team Pager) [phone] [facility] Office) 09/24/2016 7:53 AM

## 2016-09-24 NOTE — Progress Notes (Signed)
Pt isolative to room, in bed most of the day. Refused breakfast, ate lunch. Complains of poor appetite. Minimal interaction with staff/peers. Did go outside with group for short time this afternoon. Continues to have tardive dyskinesia, shuffling gait, tremors, clenched teeth. Complained of headache, PRN pain medication given as ordered. Compliant with medications. Pleasant and cooperative. Complains of poor sleep, issues at group home. Denies SI/HI/AVH. Does not appear to be responding to internal stimuli. Appears disheveled with body odor present. Support and encouragement provided. Medications administered as ordered with education. Safety maintained with every 15 minute checks. Will continue to monitor.

## 2016-09-24 NOTE — Progress Notes (Signed)
Chaplain visited the patient and patient said that he did not make a request to see a Chaplain. Chaplain thanked patient and left.   09/24/16 0900  Clinical Encounter Type  Visited With Patient  Visit Type Initial  Referral From Nurse  Consult/Referral To Chaplain  Spiritual Encounters  Spiritual Needs Other (Comment)

## 2016-09-25 LAB — GLUCOSE, CAPILLARY
GLUCOSE-CAPILLARY: 210 mg/dL — AB (ref 65–99)
GLUCOSE-CAPILLARY: 339 mg/dL — AB (ref 65–99)
Glucose-Capillary: 154 mg/dL — ABNORMAL HIGH (ref 65–99)
Glucose-Capillary: 315 mg/dL — ABNORMAL HIGH (ref 65–99)

## 2016-09-25 LAB — HEMOGLOBIN A1C
Hgb A1c MFr Bld: 11.2 % — ABNORMAL HIGH (ref 4.8–5.6)
MEAN PLASMA GLUCOSE: 275 mg/dL

## 2016-09-25 LAB — PROLACTIN: PROLACTIN: 24.5 ng/mL — AB (ref 4.0–15.2)

## 2016-09-25 MED ORDER — LURASIDONE HCL 40 MG PO TABS
80.0000 mg | ORAL_TABLET | Freq: Every day | ORAL | Status: DC
  Administered 2016-09-25: 80 mg via ORAL
  Filled 2016-09-25: qty 2

## 2016-09-25 MED ORDER — TEMAZEPAM 15 MG PO CAPS
15.0000 mg | ORAL_CAPSULE | Freq: Every day | ORAL | Status: DC
  Administered 2016-09-25 – 2016-09-28 (×4): 15 mg via ORAL
  Filled 2016-09-25 (×5): qty 1

## 2016-09-25 MED ORDER — PRIMIDONE 50 MG PO TABS
50.0000 mg | ORAL_TABLET | Freq: Three times a day (TID) | ORAL | Status: DC
  Administered 2016-09-25 – 2016-09-27 (×6): 50 mg via ORAL
  Filled 2016-09-25 (×7): qty 1

## 2016-09-25 MED ORDER — VENLAFAXINE HCL ER 75 MG PO CP24
150.0000 mg | ORAL_CAPSULE | Freq: Every day | ORAL | Status: DC
  Administered 2016-09-26 – 2016-10-03 (×8): 150 mg via ORAL
  Filled 2016-09-25 (×8): qty 2

## 2016-09-25 NOTE — [facility] Group Notes (Signed)
[facility] LCSW Group Therapy  09/25/2016 3:44 PM  Type of Therapy:  Group Therapy  Participation Level:  Patient did not attend group. CSW invited patient to group.   Summary of Progress/Problems: Feelings around Relapse. Group members discussed the meaning of relapse and shared personal stories of relapse, how it affected them and others, and how they perceived themselves during this time. Group members were encouraged to identify triggers, warning signs and coping skills used when facing the possibility of relapse. Social supports were discussed and explored in detail. Patients also discussed facing disappointment and how that can trigger someone to relapse.  Kewan Mcnease G. Garnette Czech MSW, LCSWA 09/25/2016, 3:44 PM

## 2016-09-25 NOTE — Progress Notes (Signed)
D: Pt denies SI/HI/AVH. Pt is pleasant and cooperative, affect is flat speech is tangential, thoughts are sometimes disorganized, he appears less anxious and he is interacting with peers and staff appropriately.  A: Pt was offered support and encouragement. Pt was given scheduled medications. Pt was encouraged to attend groups. Q 15 minute checks were done for safety.  R:Pt attends groups and interacts well with peers and staff. Pt is taking medication. Pt has no complaints.Pt receptive to treatment and safety maintained on unit.

## 2016-09-25 NOTE — Progress Notes (Signed)
Recreation Therapy Notes  Date: 05.04.18 Time: 9:30 am Location: Craft Room  Group Topic: Coping Skills  Goal Area(s) Addresses:  Patient will participate in healthy coping skill. Patient will verbalize benefit of using art as a coping skill.  Behavioral Response: Did not attend  Intervention: Coloring  Activity: Patients were given coloring sheets to color and were instructed to think about the emotions they were feeling as well as what their minds were focused on.  Education: LRT educated patients on healthy coping skills.  Education Outcome: Patient did not attend group.  Clinical Observations/Feedback: Patient did not attend group.  Jacquelynn Cree, LRT/CTRS 09/25/2016 10:14 AM

## 2016-09-25 NOTE — Progress Notes (Signed)
First am patient reports that he was not hearing well.  Endorses AH, cannot make out what they are saying and complaining of anxiety and having headache. Patient returned to his room to lay down. Dr. Jennet Maduro made aware.  As day progressed patient verbalized feeling much better.  Visible in the milieu.  `No initiation of conversation although engages when approached.  Denies SI and depression.  Continually asks "Do you think I will be okay"  Support and encouragement offered.  Safety checks maintained.  Scheduled medications given.

## 2016-09-25 NOTE — Tx Team (Addendum)
Interdisciplinary Treatment and Diagnostic Plan Update  09/25/2016 Time of Session: 1107 Tony Cunningham MRN: [id number]  Principal Diagnosis: Schizoaffective disorder, depressive type ([place])  Secondary Diagnoses: Principal Problem:   Schizoaffective disorder, depressive type ([place]) Active Problems:   Diabetes ([place]   HTN (hypertension)   Tobacco use disorder   Dyslipidemia   Asthma   Tardive dyskinesia   Polysubstance abuse   Suicidal ideation   Current Medications:  Current Facility-Administered Medications  Medication Dose Route Frequency Provider Last Rate Last Dose  . acetaminophen (TYLENOL) tablet 650 mg  650 mg Oral Q6H PRN Gonzella Lex, MD   650 mg at 09/24/16 1400  . alum & mag hydroxide-simeth (MAALOX/MYLANTA) 200-200-20 MG/5ML suspension 30 mL  30 mL Oral Q4H PRN Gonzella Lex, MD      . amantadine (SYMMETREL) capsule 100 mg  100 mg Oral BID Gonzella Lex, MD   100 mg at 09/25/16 0851  . diphenhydrAMINE (BENADRYL) capsule 50 mg  50 mg Oral QHS Hildred Priest, MD   50 mg at 09/24/16 2221  . hydrALAZINE (APRESOLINE) tablet 25 mg  25 mg Oral Q8H Gonzella Lex, MD   25 mg at 09/25/16 0640  . insulin aspart (novoLOG) injection 0-15 Units  0-15 Units Subcutaneous TID WC Hildred Priest, MD   5 Units at 09/25/16 1136  . insulin aspart (novoLOG) injection 0-5 Units  0-5 Units Subcutaneous QHS Hildred Priest, MD   2 Units at 09/24/16 2222  . insulin glargine (LANTUS) injection 41 Units  41 Units Subcutaneous QHS Hildred Priest, MD   41 Units at 09/24/16 2221  . lamoTRIgine (LAMICTAL) tablet 25 mg  25 mg Oral Daily Hildred Priest, MD   25 mg at 09/25/16 0852  . lisinopril (PRINIVIL,ZESTRIL) tablet 20 mg  20 mg Oral Daily Gonzella Lex, MD   20 mg at 09/25/16 0851  . magnesium hydroxide (MILK OF MAGNESIA) suspension 30 mL  30 mL Oral Daily PRN Gonzella Lex, MD      . metFORMIN (GLUCOPHAGE) tablet 1,000 mg  1,000 mg Oral BID WC  Gonzella Lex, MD   1,000 mg at 09/25/16 0851  . montelukast (SINGULAIR) tablet 10 mg  10 mg Oral QHS Gonzella Lex, MD   10 mg at 09/24/16 2220  . nicotine (NICODERM CQ - dosed in mg/24 hours) patch 21 mg  21 mg Transdermal Daily Hildred Priest, MD   21 mg at 09/25/16 0852  . simvastatin (ZOCOR) tablet 40 mg  40 mg Oral q1800 Gonzella Lex, MD   40 mg at 09/24/16 1649  . venlafaxine XR (EFFEXOR-XR) 24 hr capsule 300 mg  300 mg Oral Q breakfast Gonzella Lex, MD   300 mg at 09/25/16 0851  . ziprasidone (GEODON) capsule 60 mg  60 mg Oral BID WC Hildred Priest, MD   60 mg at 09/25/16 3875   PTA Medications: Prescriptions Prior to Admission  Medication Sig Dispense Refill Last Dose  . amantadine (SYMMETREL) 100 MG capsule Take 1 capsule (100 mg total) by mouth 2 (two) times daily. (Patient not taking: Reported on 09/23/2016) 60 capsule 0 Not Taking at Unknown time  . benztropine (COGENTIN) 0.5 MG tablet Take 0.5 mg by mouth daily.   09/23/2016 at 0800  . insulin glargine (LANTUS) 100 UNIT/ML injection Inject 0.37 mLs (37 Units total) into the skin at bedtime. (Patient not taking: Reported on 09/23/2016) 11 mL 0 Not Taking at Unknown time  . insulin NPH-regular Human (NOVOLIN 70/30) (70-30)  100 UNIT/ML injection Inject 30 Units into the skin daily with breakfast.   09/23/2016 at 0800  . lisinopril-hydrochlorothiazide (PRINZIDE,ZESTORETIC) 20-25 MG tablet Take 1 tablet by mouth daily. For high blood pressure 30 tablet 0 09/23/2016 at 0800  . meloxicam (MOBIC) 7.5 MG tablet Take 7.5 mg by mouth daily.   09/23/2016 at 0800  . metFORMIN (GLUCOPHAGE) 1000 MG tablet Take 1 tablet (1,000 mg total) by mouth 2 (two) times daily with a meal. 60 tablet 0 09/23/2016 at 0800  . montelukast (SINGULAIR) 10 MG tablet Take 1 tablet (10 mg total) by mouth at bedtime. For Asthma 30 tablet 0 09/22/2016 at 2000  . OLANZapine (ZYPREXA) 20 MG tablet Take 1 tablet (20 mg total) by mouth at bedtime. (Patient not  taking: Reported on 09/23/2016) 30 tablet 0 Not Taking at Unknown time  . risperiDONE (RISPERDAL) 2 MG tablet Take 2 mg by mouth 2 (two) times daily.   09/23/2016 at 0800  . simvastatin (ZOCOR) 40 MG tablet Take 1 tablet (40 mg total) by mouth at bedtime. For high cholesterol 15 tablet 0 09/22/2016 at 2000  . traZODone (DESYREL) 100 MG tablet Take 1 tablet (100 mg total) by mouth at bedtime. (Patient taking differently: Take 150 mg by mouth at bedtime. ) 30 tablet 0 09/22/2016 at 2000  . venlafaxine XR (EFFEXOR-XR) 150 MG 24 hr capsule Take 2 capsules (300 mg total) by mouth daily with breakfast. 60 capsule 0 07/24/2016 at Unknown time    Patient Stressors: Financial difficulties Medication change or noncompliance  Patient Strengths: Motivation for treatment/growth  Treatment Modalities: Medication Management, Group therapy, Case management,  1 to 1 session with clinician, Psychoeducation, Recreational therapy.   Physician Treatment Plan for Primary Diagnosis: Schizoaffective disorder, depressive type ([place]) Long Term Goal(s): Improvement in symptoms so as ready for discharge Improvement in symptoms so as ready for discharge   Short Term Goals: Ability to verbalize feelings will improve Ability to demonstrate self-control will improve Ability to identify and develop effective coping behaviors will improve Ability to identify and develop effective coping behaviors will improve Ability to identify triggers associated with substance abuse/mental health issues will improve  Medication Management: Evaluate patient's response, side effects, and tolerance of medication regimen.  Therapeutic Interventions: 1 to 1 sessions, Unit Group sessions and Medication administration.  Evaluation of Outcomes: Not Met  Physician Treatment Plan for Secondary Diagnosis: Principal Problem:   Schizoaffective disorder, depressive type ([place]) Active Problems:   Diabetes ([place])   HTN (hypertension)   Tobacco use  disorder   Dyslipidemia   Asthma   Tardive dyskinesia   Polysubstance abuse   Suicidal ideation  Long Term Goal(s): Improvement in symptoms so as ready for discharge Improvement in symptoms so as ready for discharge   Short Term Goals: Ability to verbalize feelings will improve Ability to demonstrate self-control will improve Ability to identify and develop effective coping behaviors will improve Ability to identify and develop effective coping behaviors will improve Ability to identify triggers associated with substance abuse/mental health issues will improve     Medication Management: Evaluate patient's response, side effects, and tolerance of medication regimen.  Therapeutic Interventions: 1 to 1 sessions, Unit Group sessions and Medication administration.  Evaluation of Outcomes: Not Met   RN Treatment Plan for Primary Diagnosis: Schizoaffective disorder, depressive type ([place]) Long Term Goal(s): Knowledge of disease and therapeutic regimen to maintain health will improve  Short Term Goals: Ability to identify and develop effective coping behaviors will improve and Compliance with prescribed  medications will improve  Medication Management: RN will administer medications as ordered by provider, will assess and evaluate patient's response and provide education to patient for prescribed medication. RN will report any adverse and/or side effects to prescribing provider.  Therapeutic Interventions: 1 on 1 counseling sessions, Psychoeducation, Medication administration, Evaluate responses to treatment, Monitor vital signs and CBGs as ordered, Perform/monitor CIWA, COWS, AIMS and Fall Risk screenings as ordered, Perform wound care treatments as ordered.  Evaluation of Outcomes: Not Met   LCSW Treatment Plan for Primary Diagnosis: Schizoaffective disorder, depressive type ([place]) Long Term Goal(s): Safe transition to appropriate next level of care at discharge, Engage patient in  therapeutic group addressing interpersonal concerns.  Short Term Goals: Engage patient in aftercare planning with referrals and resources and Increase skills for wellness and recovery  Therapeutic Interventions: Assess for all discharge needs, 1 to 1 time with Social worker, Explore available resources and support systems, Assess for adequacy in community support network, Educate family and significant other(s) on suicide prevention, Complete Psychosocial Assessment, Interpersonal group therapy.  Evaluation of Outcomes: Not Met    Recreational Therapy Treatment Plan for Primary Diagnosis: Schizoaffective disorder, depressive type ([place]) Long Term Goal(s): Patient will participate in recreation therapy treatment in at least 2 group sessions without prompting from LRT  Short Term Goals: Increase self-esteem  Treatment Modalities: Group Therapy and Individual Treatment Sessions  Therapeutic Interventions: Psychoeducation  Evaluation of Outcomes: Progressing   Progress in Treatment: Attending groups: No. Participating in groups: No. Taking medication as prescribed: Yes. Toleration medication: Yes. Family/Significant other contact made: No, will contact:  mother Patient understands diagnosis: Yes. Discussing patient identified problems/goals with staff: Yes. Medical problems stabilized or resolved: Yes. Denies suicidal/homicidal ideation: Yes. Issues/concerns per patient self-inventory: No. Other: none  New problem(s) identified: No, Describe:  none  New Short Term/Long Term Goal(s): Pt goal: I can't think straight.  Discharge Plan or Barriers: Pt will return to outpatient care.  Reason for Continuation of Hospitalization: Hallucinations Medication stabilization  Estimated Length of Stay: 5-7 days.  Attendees: Patient: Tony Cunningham 09/25/2016  Physician: Dr. Bary Leriche, MD 09/25/2016   Nursing:  09/25/2016   RN Care Manager: 09/25/2016   Social Worker: Lurline Idol, LCSW 09/25/2016    Recreational Therapist: Drue Flirt, LRT/CTRS  09/25/2016   Other:  09/25/2016   Other:  09/25/2016        Scribe for Treatment Team: Joanne Chars, [place] 09/25/2016 11:44 AM

## 2016-09-25 NOTE — BHH Counselor (Signed)
Adult Comprehensive Assessment  Patient ID: Tony Cunningham, male   DOB: Oct 08, 1975, 41 y.o.   MRN: [id number]  Information Source: Information source: Patient  Current Stressors:  Social relationships: PT reports problems at his group home.  Reports people yelling at him.  Living/Environment/Situation:  Living Arrangements: Group Home Living conditions (as described by patient or guardian): Pt does not like it at this group home. How long has patient lived in current situation?: 3 months What is atmosphere in current home: Chaotic  Family History:  Marital status: Single Are you sexually active?: Yes What is your sexual orientation?: heterosexual Has your sexual activity been affected by drugs, alcohol, medication, or emotional stress?: none Does patient have children?: No  Childhood History:  By whom was/is the patient raised?: Both parents Additional childhood history information: Pt reports having a good childhood.  No issues reported.  Description of patient's relationship with caregiver when they were a child: Pt reports being closer to his father.  Patient's description of current relationship with people who raised him/her: Pt sees his parents weekly.  Pt reports he gets along with his parents. How were you disciplined when you got in trouble as a child/adolescent?: appropriate discipline Does patient have siblings?: Yes Number of Siblings: 3 Description of patient's current relationship with siblings: 2 brothers, 1 sister - Pt reports being close to 1 brother and his sister.   Did patient suffer any verbal/emotional/physical/sexual abuse as a child?: No Did patient suffer from severe childhood neglect?: No Has patient ever been sexually abused/assaulted/raped as an adolescent or adult?: No Was the patient ever a victim of a crime or a disaster?: No Witnessed domestic violence?: No Has patient been effected by domestic violence as an adult?: No  Education:  Highest grade of  school patient has completed: 11 Currently a Consulting civil engineer?: No Learning disability?:  (unsure)  Employment/Work Situation:   Employment situation: On disability Why is patient on disability: mental health How long has patient been on disability: "all my life" Patient's job has been impacted by current illness: No What is the longest time patient has a held a job?: [employer] Where was the patient employed at that time?: 8-11 years Has patient ever been in the [employer]?: No Are There Guns or Other Weapons in Your Home?: No  Financial Resources:   Surveyor, quantity resources: Writer Does patient have a Lawyer or guardian?: No  Alcohol/Substance Abuse:   What has been your use of drugs/alcohol within the last 12 months?: Pt denies any alcohol or drug use If attempted suicide, did drugs/alcohol play a role in this?: No Alcohol/Substance Abuse Treatment Hx: Denies past history Has alcohol/substance abuse ever caused legal problems?: No  Social Support System:   Conservation officer, nature Support System: Fair Museum/gallery exhibitions officer System: mom Type of faith/religion: Scientist, research (life sciences):   Leisure and Hobbies: video games  Strengths/Needs:   What things does the patient do well?: Pt unable to answer In what areas does patient struggle / problems for patient: PT unable to answer  Discharge Plan:   Does patient have access to transportation?: Yes Will patient be returning to same living situation after discharge?: Yes Currently receiving community mental health services: Yes (From Whom) Does patient have financial barriers related to discharge medications?: No  Summary/Recommendations:   Summary and Recommendations (to be completed by the evaluator): Pt is 41 year old male from [place].  He is diagnosed with schizoaffective disorder and was admitted after reporting command auditory hallucinations telling him  to harm himself.  Recommendations for pt  include crisis stabilization, therapeutic milieu, attend and participate in groups, medication management, and development of comprehensive mental wellness plan.  Upon discharge, pt will return to outpatient care.  Tony Cunningham. 09/25/2016

## 2016-09-25 NOTE — Progress Notes (Signed)
Endorses AH but doesn't hear them clearly. [name] SI/HI/VH. Affect anxious and preoccupied. Denies pain. Frequently isolates to room. Denies pain. Appropriate and polite during interaction. Medication compliant. Voices no additional concerns at this time. Safety maintained. Will continue to monitor.

## 2016-09-25 NOTE — Progress Notes (Signed)
[facility] MD Progress Note  09/25/2016 11:46 AM Tony Cunningham  MRN:  [id number]  Subjective:  41 year old single male with history of schizoaffective disorder depressed type patient came voluntarily to our emergency department in the company of mobile crisis unit. He was complaining of hearing voices which were commanding him to harm himself. Patient reported that he was thinking about killing himself by running into traffic or drowning himself.  5/4. [name] appears anxious today. States he did not sleep well and is feeling very nervous. Experiencing shaking and hand tremors. Denies suicidality, homicidality, auditory or visual hallucinations.   Per Nurse:  D: Pt denies SI/HI/AVH. Pt is pleasant and cooperative, affect is flat speech is tangential, thoughts are sometimes disorganized, he appears less anxious and he is interacting with peers and staff appropriately.   A: Pt was offered support and encouragement. Pt was given scheduled medications. Pt was encouraged to attend groups. Q 15 minute checks were done for safety.   R:Pt attends groups and interacts well with peers and staff. Pt is taking medication. Pt has no complaints.Pt receptive to treatment and safety maintained on unit  Principal Problem: Schizoaffective disorder, depressive type ([place]) Diagnosis:   Patient Active Problem List   Diagnosis Date Noted  . Suicidal ideation [R45.851] 09/23/2016  . Schizoaffective disorder, depressive type (Tony Cunningham) [F25.1] 09/23/2016  . Polysubstance abuse [F19.10] 07/17/2016  . Tardive dyskinesia [G24.01] 07/16/2016  . Tobacco use disorder [F17.200] 07/07/2016  . Dyslipidemia [E78.5] 07/07/2016  . Asthma [J45.909] 07/07/2016  . HTN (hypertension) [I10] 07/06/2016  . Diabetes ([place]) [E11.9] 12/25/2010   Total Time spent with patient: 30 minutes  Past Psychiatric History: Patient has a long history of mental illness and has had multiple hospitalizations including multiple hospitalizations in [place].  Most of those records are unavailable to me. 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 Not sure whether he ever returns to an adequate baseline. He tells me that he has tried to cut himself and kill himself in the past. Denies any history of violence. He can't remember the names of any of his medicines. Outpatient medicines listed on admission included Abilify and Effexor. Also hospitalized at [facility] [place].  Past Medical History:  Past Medical History:  Diagnosis Date  . Anxiety   . Asthma   . Diabetes mellitus   . High blood pressure   . Sinus complaint    History reviewed. No pertinent surgical history.   Family History: History reviewed. No pertinent family history.   Family Psychiatric  History: None known   Social History: Living in a group home. Has been there about 4-6 months he estimates. Says that he does not like it. 11 grade education. Does not have a legal guardian  History  Alcohol Use No     History  Drug Use No    Social History   Social History  . Marital status: Single    Spouse name: [name]  . Number of children: [name]  . Years of education: [name]   Social History Main Topics  . Smoking status: Current Every Day Smoker    Packs/day: 0.50    Types: Cigarettes  . Smokeless tobacco: Never Used  . Alcohol use No  . Drug use: No  . Sexual activity: Not Currently   Other Topics Concern  . None   Social History Narrative  . None   Sleep: Poor  Appetite:  Fair  Current Medications: Current Facility-Administered Medications  Medication Dose Route Frequency Provider Last Rate Last Dose  . acetaminophen (TYLENOL) tablet 650 mg  650 mg Oral Q6H PRN Gonzella Lex, MD   650 mg at 09/24/16 1400  . alum & mag hydroxide-simeth (MAALOX/MYLANTA) 200-200-20 MG/5ML suspension 30 mL  30 mL Oral Q4H PRN Gonzella Lex, MD      . amantadine (SYMMETREL) capsule 100 mg  100 mg Oral BID Gonzella Lex, MD    100 mg at 09/25/16 0851  . diphenhydrAMINE (BENADRYL) capsule 50 mg  50 mg Oral QHS Hildred Priest, MD   50 mg at 09/24/16 2221  . hydrALAZINE (APRESOLINE) tablet 25 mg  25 mg Oral Q8H Gonzella Lex, MD   25 mg at 09/25/16 0640  . insulin aspart (novoLOG) injection 0-15 Units  0-15 Units Subcutaneous TID WC Hildred Priest, MD   5 Units at 09/25/16 1136  . insulin aspart (novoLOG) injection 0-5 Units  0-5 Units Subcutaneous QHS Hildred Priest, MD   2 Units at 09/24/16 2222  . insulin glargine (LANTUS) injection 41 Units  41 Units Subcutaneous QHS Hildred Priest, MD   41 Units at 09/24/16 2221  . lamoTRIgine (LAMICTAL) tablet 25 mg  25 mg Oral Daily Hildred Priest, MD   25 mg at 09/25/16 0852  . lisinopril (PRINIVIL,ZESTRIL) tablet 20 mg  20 mg Oral Daily Gonzella Lex, MD   20 mg at 09/25/16 0851  . magnesium hydroxide (MILK OF MAGNESIA) suspension 30 mL  30 mL Oral Daily PRN Gonzella Lex, MD      . metFORMIN (GLUCOPHAGE) tablet 1,000 mg  1,000 mg Oral BID WC Gonzella Lex, MD   1,000 mg at 09/25/16 0851  . montelukast (SINGULAIR) tablet 10 mg  10 mg Oral QHS Gonzella Lex, MD   10 mg at 09/24/16 2220  . nicotine (NICODERM CQ - dosed in mg/24 hours) patch 21 mg  21 mg Transdermal Daily Hildred Priest, MD   21 mg at 09/25/16 0852  . simvastatin (ZOCOR) tablet 40 mg  40 mg Oral q1800 Gonzella Lex, MD   40 mg at 09/24/16 1649  . venlafaxine XR (EFFEXOR-XR) 24 hr capsule 300 mg  300 mg Oral Q breakfast Gonzella Lex, MD   300 mg at 09/25/16 0851  . ziprasidone (GEODON) capsule 60 mg  60 mg Oral BID WC Hildred Priest, MD   60 mg at 09/25/16 4174    Lab Results:  Results for orders placed or performed during the hospital encounter of 09/23/16 (from the past 48 hour(s))  Glucose, capillary     Status: Abnormal   Collection Time: 09/24/16  6:47 AM  Result Value Ref Range   Glucose-Capillary 347 (H) 65 - 99 mg/dL   Hemoglobin A1c     Status: Abnormal   Collection Time: 09/24/16  7:20 AM  Result Value Ref Range   Hgb A1c MFr Bld 11.2 (H) 4.8 - 5.6 %    Comment: (NOTE)         Pre-diabetes: 5.7 - 6.4         Diabetes: >6.4         Glycemic control for adults with diabetes: <7.0    Mean Plasma Glucose 275 mg/dL    Comment: (NOTE) Performed At: [facility] [address] [place], [place] [id number] Lindon Romp MD [id number]:[id number]   Prolactin     Status: Abnormal   Collection Time: 09/24/16  7:20 AM  Result Value Ref Range   Prolactin  24.5 (H) 4.0 - 15.2 ng/mL    Comment: (NOTE) Performed At: [facility] [place], [place] [id number] Lindon Romp MD [id number]   TSH     Status: None   Collection Time: 09/24/16  7:20 AM  Result Value Ref Range   TSH 1.689 0.350 - 4.500 uIU/mL    Comment: Performed by a 3rd Generation assay with a functional sensitivity of <=0.01 uIU/mL.  Basic metabolic panel     Status: Abnormal   Collection Time: 09/24/16  7:20 AM  Result Value Ref Range   Sodium 135 135 - 145 mmol/L   Potassium 3.8 3.5 - 5.1 mmol/L   Chloride 98 (L) 101 - 111 mmol/L   CO2 28 22 - 32 mmol/L   Glucose, Bld 330 (H) 65 - 99 mg/dL   BUN 8 6 - 20 mg/dL   Creatinine, Ser 0.49 (L) 0.61 - 1.24 mg/dL   Calcium 8.7 (L) 8.9 - 10.3 mg/dL   GFR calc non Af Amer >60 >60 mL/min   GFR calc Af Amer >60 >60 mL/min    Comment: (NOTE) The eGFR has been calculated using the CKD EPI equation. This calculation has not been validated in all clinical situations. eGFR's persistently <60 mL/min signify possible Chronic Kidney Disease.    Anion gap 9 5 - 15  Glucose, capillary     Status: Abnormal   Collection Time: 09/24/16 11:40 AM  Result Value Ref Range   Glucose-Capillary 217 (H) 65 - 99 mg/dL  Glucose, capillary     Status: Abnormal   Collection Time: 09/24/16  4:45 PM  Result Value Ref Range   Glucose-Capillary 266 (H) 65 - 99 mg/dL  Glucose, capillary      Status: Abnormal   Collection Time: 09/24/16  8:29 PM  Result Value Ref Range   Glucose-Capillary 234 (H) 65 - 99 mg/dL  Glucose, capillary     Status: Abnormal   Collection Time: 09/25/16  6:53 AM  Result Value Ref Range   Glucose-Capillary 154 (H) 65 - 99 mg/dL  Glucose, capillary     Status: Abnormal   Collection Time: 09/25/16 11:31 AM  Result Value Ref Range   Glucose-Capillary 210 (H) 65 - 99 mg/dL    Blood Alcohol level:  Lab Results  Component Value Date   ETH <5 09/23/2016   ETH <5 03/55/9741    Metabolic Disorder Labs: Lab Results  Component Value Date   HGBA1C 11.2 (H) 09/24/2016   MPG 275 09/24/2016   MPG 321 07/07/2016   Lab Results  Component Value Date   PROLACTIN 24.5 (H) 09/24/2016   PROLACTIN 3.4 (L) 07/07/2016   Lab Results  Component Value Date   CHOL 120 07/07/2016   TRIG 153 (H) 07/07/2016   HDL 33 (L) 07/07/2016   CHOLHDL 3.6 07/07/2016   VLDL 31 07/07/2016   LDLCALC 56 07/07/2016   LDLCALC 99 02/08/2016    Physical Findings: AIMS: Facial and Oral Movements Muscles of Facial Expression: Severe Lips and Perioral Area: Severe Jaw: None, normal Tongue: Moderate,Extremity Movements Upper (arms, wrists, hands, fingers): Mild Lower (legs, knees, ankles, toes): None, normal, Trunk Movements Neck, shoulders, hips: None, normal, Overall Severity Severity of abnormal movements (highest score from questions above): Moderate Incapacitation due to abnormal movements: None, normal Patient's awareness of abnormal movements (rate only patient's report): No Awareness, Dental Status Current problems with teeth and/or dentures?: No Does patient usually wear dentures?: No  CIWA:    COWS:  Musculoskeletal: Strength & Muscle Tone: within normal limits Gait & Station: normal Patient leans: [name]  Psychiatric Specialty Exam: Physical Exam  Constitutional: He is oriented to person, place, and time. He appears well-developed and well-nourished.   HENT:  Head: Normocephalic.  Respiratory: Effort normal.  Neurological: He is alert and oriented to person, place, and time.    Review of Systems  Constitutional: Negative.   HENT: Negative.   Eyes: Negative.   Respiratory: Negative.   Cardiovascular: Negative.   Gastrointestinal: Negative.   Genitourinary: Negative.   Musculoskeletal: Negative.   Skin: Negative.   Neurological: Positive for tremors. Negative for dizziness, tingling, sensory change, speech change, focal weakness, seizures, loss of consciousness and headaches.  Endo/Heme/Allergies: Negative.   Psychiatric/Behavioral: Negative for depression, hallucinations, memory loss, substance abuse and suicidal ideas. The patient is nervous/anxious. The patient does not have insomnia.     Blood pressure 118/84, pulse 74, temperature 98.5 F (36.9 C), temperature source Oral, resp. rate 18, height 5' 8" (1.727 m), weight 80.7 kg (178 lb), SpO2 99 %.Body mass index is 27.06 kg/m.  General Appearance: Disheveled  Eye Contact:  Good  Speech:  Slow  Volume:  Decreased  Mood:  Anxious and Dysphoric  Affect:  Appropriate  Thought Process:  Irrelevant  Orientation:  NA  Thought Content:  Illogical  Suicidal Thoughts:  No  Homicidal Thoughts:  No  Memory:  Immediate;   Fair Recent;   Poor Remote;   Poor  Judgement:  Poor  Insight:  Lacking  Psychomotor Activity:  Normal  Concentration:  Concentration: Fair and Attention Span: Fair  Recall:  Poor  Fund of Knowledge:  Poor  Language:  Fair  Akathisia:  No  Handed:    AIMS (if indicated):     Assets:    ADL's:  Intact  Cognition:  Impaired,  Mild  Sleep:  Number of Hours: 7.75     Treatment Plan Summary:  41 y/o male with depression, SI and hallucinations. Multiple prior hospitalization and prior suicidal attempts. Likely Schizoaffective diosorder.   Per review of records patient has history of abusing opiates, Lyrica, Remeron and Flexeril. Here he has been  frequently asking for Ativan or clonazepam.  Depressive symptoms: continue Effexor; lamictal for mood stabilization  Psychosis: d/c olanzapine due to poorly controlled diabetes and Geodon for restlessness and started Latuda 80 mg.   Akathisia/TD: continue amantadine 100 mg po bid  Insomnia: Continue benadryl   History of abusing opiates, Flexeril, Lyrica and mirtazapine. We'll benefit from continuing outpatient substance abuse services. We'll not discharge him with controlled substances.   For diabetes: Lantus 41 units at bedtime as recommended by DM coordinator. Patient has orders for supplemental insulin. Also continue metformin bid   Hypertension: Continue lisinopril a day and Hydralazine tid  Dyslipidemia continue Zocor at bedtime  Asthma continue Singulair at bedtime  Tobacco use disorder: pt receivednicotine patch 21 mg a day  Labs: hemoglobin A1c, lipid panel, TSH all completed---we had all of these from Feb 2018  Diet carb modified and low sodium  Disposition: will be discharged back to his group home.   Follow up: continue to f/u with [facility] in [place]. Continue day program.  Will refer for case coordination   Doristine Mango, [place] 09/25/2016, 11:46 AM

## 2016-09-25 NOTE — Plan of Care (Signed)
Problem: Coping: Goal: Ability to verbalize feelings will improve Outcome: Progressing Patient verbalized feelings to staff.    

## 2016-09-26 LAB — GLUCOSE, CAPILLARY
GLUCOSE-CAPILLARY: 134 mg/dL — AB (ref 65–99)
GLUCOSE-CAPILLARY: 229 mg/dL — AB (ref 65–99)
Glucose-Capillary: 98 mg/dL (ref 65–99)
Glucose-Capillary: 122 mg/dL — ABNORMAL HIGH (ref 65–99)
Glucose-Capillary: 318 mg/dL — ABNORMAL HIGH (ref 65–99)

## 2016-09-26 MED ORDER — DIPHENHYDRAMINE HCL 25 MG PO CAPS
50.0000 mg | ORAL_CAPSULE | Freq: Once | ORAL | Status: AC
Start: 2016-09-26 — End: 2016-09-26
  Administered 2016-09-26: 50 mg via ORAL
  Filled 2016-09-26: qty 2

## 2016-09-26 MED ORDER — LORAZEPAM 2 MG/ML IJ SOLN
2.0000 mg | INTRAMUSCULAR | Status: AC
  Administered 2016-09-26: 2 mg via INTRAMUSCULAR
  Filled 2016-09-26: qty 1

## 2016-09-26 MED ORDER — KETOROLAC TROMETHAMINE 30 MG/ML IJ SOLN
30.0000 mg | Freq: Once | INTRAMUSCULAR | Status: AC
  Administered 2016-09-26: 30 mg via INTRAMUSCULAR
  Filled 2016-09-26: qty 1

## 2016-09-26 MED ORDER — AMANTADINE HCL 100 MG PO CAPS
100.0000 mg | ORAL_CAPSULE | Freq: Two times a day (BID) | ORAL | Status: DC
  Administered 2016-09-26 – 2016-10-03 (×14): 100 mg via ORAL
  Filled 2016-09-26 (×14): qty 1

## 2016-09-26 MED ORDER — DIPHENHYDRAMINE HCL 50 MG/ML IJ SOLN
50.0000 mg | INTRAMUSCULAR | Status: AC
  Administered 2016-09-26: 50 mg via INTRAMUSCULAR
  Filled 2016-09-26: qty 1

## 2016-09-26 NOTE — [facility] Group Notes (Signed)
[facility] LCSW Group Therapy  09/26/2016 2:17 PM  Type of Therapy:  Group Therapy  Participation Level:  Patient did not attend group. CSW invited patient to group.   Summary of Progress/Problems: Communications: Patients identify how individuals communicate with one another appropriately and inappropriately. Patients will be guided to discuss their thoughts, feelings, and behaviors related to barriers when communicating. The group will process together ways to execute positive and appropriate communications.   Gurvir Schrom G. Garnette Czech MSW, LCSWA 09/26/2016, 2:17 PM

## 2016-09-26 NOTE — Progress Notes (Signed)
No new orders per Dr. Caprice Kluver see pt today.

## 2016-09-26 NOTE — Progress Notes (Signed)
Pt to nurse stating "I think I need my neck x-rayed. I'm going crazy. I just want to relax, but I can't." Pt tearful, crying. Denies AVH. Appears to be very stiff, having hand tremors, shuffling gait, anxious. Paged Dr. Toni Amend. Tylenol given as ordered PRN. Safety maintained. Will continue to monitor.

## 2016-09-26 NOTE — Progress Notes (Signed)
Spent most of the day in bed, lying flat on back with obvious tremors of his body. Reports relief after IM of ativan, benadryl and Toradol given as ordered for neck pain/stiffness. Denies hallucinations to this nurse, but endorses "feeling crazy, what if I might hurt someone?" Denies SI/HI/AVH. Did not attend group. Pt is medication compliant. Appears flat, depressed. Support and encouragement provided. Medications administered with education. Safety maintained with every 15 minute checks. Will continue to monitor.

## 2016-09-26 NOTE — Plan of Care (Signed)
Problem: Activity: Goal: Interest or engagement in leisure activities will improve Outcome: Not Progressing Pt currently reports not feel well, "feeling crazy." In bed most of the day, poor appetite.

## 2016-09-26 NOTE — Progress Notes (Signed)
[facility] MD Progress Note  09/26/2016 2:28 PM Tony Cunningham  MRN:  [id number]  Subjective:  41 year old single male with history of schizoaffective disorder depressed type patient came voluntarily to our emergency department in the company of mobile crisis unit. He was complaining of hearing voices which were commanding him to harm himself. Patient reported that he was thinking about killing himself by running into traffic or drowning himself.  5/4. Tony Cunningham appears anxious today. States he did not sleep well and is feeling very nervous. Experiencing shaking and hand tremors. Denies suicidality, homicidality, auditory or visual hallucinations.   Patient seen today is Saturday the fifth. Today he is complaining of severe neck pain. Also feeling tremulous all over. He was given Benadryl orally earlier in the day without any clear benefit. Continues to complain of pain. When I saw him in his room he is also complaining of just generally feeling terrible. Says he is still feeling confused and angry and mixed up. Still has hallucinations. At the same time the neck pain and the tremulousness are driving him crazy. Patient has not been violent or threatening on the unit. Generally appears to be cooperative. Just started Latuda last night which was a replacement for Geodon which replaced his previous Zyprexa  Per Nurse:  D: Pt denies SI/HI/AVH. Pt is pleasant and cooperative, affect is flat speech is tangential, thoughts are sometimes disorganized, he appears less anxious and he is interacting with peers and staff appropriately.   A: Pt was offered support and encouragement. Pt was given scheduled medications. Pt was encouraged to attend groups. Q 15 minute checks were done for safety.   R:Pt attends groups and interacts well with peers and staff. Pt is taking medication. Pt has no complaints.Pt receptive to treatment and safety maintained on unit  Principal Problem: Schizoaffective disorder, depressive type  (HCC) Diagnosis:   Patient Active Problem List   Diagnosis Date Noted  . Suicidal ideation [R45.851] 09/23/2016  . Schizoaffective disorder, depressive type (HCC) [F25.1] 09/23/2016  . Polysubstance abuse [F19.10] 07/17/2016  . Tardive dyskinesia [G24.01] 07/16/2016  . Tobacco use disorder [F17.200] 07/07/2016  . Dyslipidemia [E78.5] 07/07/2016  . Asthma [J45.909] 07/07/2016  . HTN (hypertension) [I10] 07/06/2016  . Diabetes (HCC) [E11.9] 12/25/2010   Total Time spent with patient: 30 minutes  Past Psychiatric History: Patient has a long history of mental illness and has had multiple hospitalizations including multiple hospitalizations in [place]. Most of those records are unavailable to me. 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 Not sure whether he ever returns to an adequate baseline. He tells me that he has tried to cut himself and kill himself in the past. Denies any history of violence. He can't remember the names of any of his medicines. Outpatient medicines listed on admission included Abilify and Effexor. Also hospitalized at [facility], [facility].  Past Medical History:  Past Medical History:  Diagnosis Date  . Anxiety   . Asthma   . Diabetes mellitus   . High blood pressure   . Sinus complaint    History reviewed. No pertinent surgical history.   Family History: History reviewed. No pertinent family history.   Family Psychiatric  History: None known   Social History: Living in a group home. Has been there about 4-6 months he estimates. Says that he does not like it. 11 grade education. Does not have a legal guardian  History  Alcohol Use No  History  Drug Use No    Social History   Social History  . Marital status: Single    Spouse name: [name]  . Number of children: [name]  . Years of education: [name]   Social History Main Topics  . Smoking status: Current Every Day Smoker    Packs/day: 0.50     Types: Cigarettes  . Smokeless tobacco: Never Used  . Alcohol use No  . Drug use: No  . Sexual activity: Not Currently   Other Topics Concern  . None   Social History Narrative  . None   Sleep: Poor  Appetite:  Fair  Current Medications: Current Facility-Administered Medications  Medication Dose Route Frequency Provider Last Rate Last Dose  . acetaminophen (TYLENOL) tablet 650 mg  650 mg Oral Q6H PRN Tymber Stallings, [name], MD   650 mg at 09/26/16 1055  . alum & mag hydroxide-simeth (MAALOX/MYLANTA) 200-200-20 MG/5ML suspension 30 mL  30 mL Oral Q4H PRN Wright Gravely T, MD      . amantadine (SYMMETREL) capsule 100 mg  100 mg Oral BID Norrine Ballester T, MD      . hydrALAZINE (APRESOLINE) tablet 25 mg  25 mg Oral Q8H Zailah Zagami T, MD   25 mg at 09/26/16 1410  . insulin aspart (novoLOG) injection 0-15 Units  0-15 Units Subcutaneous TID WC [name], MD   11 Units at 09/26/16 1115  . insulin aspart (novoLOG) injection 0-5 Units  0-5 Units Subcutaneous QHS [name], MD   4 Units at 09/25/16 2146  . insulin glargine (LANTUS) injection 41 Units  41 Units Subcutaneous QHS [name], MD   41 Units at 09/25/16 2145  . lamoTRIgine (LAMICTAL) tablet 25 mg  25 mg Oral Daily [name], MD   25 mg at 09/26/16 0842  . lisinopril (PRINIVIL,ZESTRIL) tablet 20 mg  20 mg Oral Daily Sherronda Sweigert, [name], MD   20 mg at 09/26/16 0842  . magnesium hydroxide (MILK OF MAGNESIA) suspension 30 mL  30 mL Oral Daily PRN Bryton Romagnoli T, MD      . metFORMIN (GLUCOPHAGE) tablet 1,000 mg  1,000 mg Oral BID WC Eilee Schader, [name], MD   1,000 mg at 09/26/16 0842  . montelukast (SINGULAIR) tablet 10 mg  10 mg Oral QHS Daryus Sowash, [name], MD   10 mg at 09/25/16 2144  . nicotine (NICODERM CQ - dosed in mg/24 hours) patch 21 mg  21 mg Transdermal Daily [name], MD   21 mg at 09/26/16 0845  . primidone (MYSOLINE) tablet 50 mg  50 mg Oral Q8H [name],  [name], MD   50 mg at 09/26/16 1411  . simvastatin (ZOCOR) tablet 40 mg  40 mg Oral q1800 Josephine Rudnick, [name], MD   40 mg at 09/25/16 1621  . temazepam (RESTORIL) capsule 15 mg  15 mg Oral QHS [name], [name], MD   15 mg at 09/25/16 2144  . venlafaxine XR (EFFEXOR-XR) 24 hr capsule 150 mg  150 mg Oral Q breakfast [name], [name], MD   150 mg at 09/26/16 5027    Lab Results:  Results for orders placed or performed during the hospital encounter of 09/23/16 (from the past 48 hour(s))  Glucose, capillary     Status: Abnormal   Collection Time: 09/24/16  4:45 PM  Result Value Ref Range   Glucose-Capillary 266 (H) 65 - 99 mg/dL  Glucose, capillary     Status: Abnormal   Collection Time: 09/24/16  8:29 PM  Result Value Ref Range   Glucose-Capillary 234 (H) 65 - 99 mg/dL  Glucose, capillary     Status: Abnormal   Collection Time: 09/25/16  6:53 AM  Result Value Ref Range   Glucose-Capillary 154 (H) 65 - 99 mg/dL  Glucose, capillary     Status: Abnormal   Collection Time: 09/25/16 11:31 AM  Result Value Ref Range   Glucose-Capillary 210 (H) 65 - 99 mg/dL  Glucose, capillary     Status: Abnormal   Collection Time: 09/25/16  4:12 PM  Result Value Ref Range   Glucose-Capillary 339 (H) 65 - 99 mg/dL   Comment 1 Notify RN   Glucose, capillary     Status: Abnormal   Collection Time: 09/25/16  7:59 PM  Result Value Ref Range   Glucose-Capillary 315 (H) 65 - 99 mg/dL  Glucose, capillary     Status: None   Collection Time: 09/26/16  6:41 AM  Result Value Ref Range   Glucose-Capillary 98 65 - 99 mg/dL  Glucose, capillary     Status: Abnormal   Collection Time: 09/26/16  8:50 AM  Result Value Ref Range   Glucose-Capillary 122 (H) 65 - 99 mg/dL  Glucose, capillary     Status: Abnormal   Collection Time: 09/26/16 11:13 AM  Result Value Ref Range   Glucose-Capillary 318 (H) 65 - 99 mg/dL   Comment 1 Notify RN     Blood Alcohol level:  Lab Results  Component Value Date   ETH <5  09/23/2016   ETH <5 07/24/2016    Metabolic Disorder Labs: Lab Results  Component Value Date   HGBA1C 11.2 (H) 09/24/2016   MPG 275 09/24/2016   MPG 321 07/07/2016   Lab Results  Component Value Date   PROLACTIN 24.5 (H) 09/24/2016   PROLACTIN 3.4 (L) 07/07/2016   Lab Results  Component Value Date   CHOL 120 07/07/2016   TRIG 153 (H) 07/07/2016   HDL 33 (L) 07/07/2016   CHOLHDL 3.6 07/07/2016   VLDL 31 07/07/2016   LDLCALC 56 07/07/2016   LDLCALC 99 02/08/2016    Physical Findings: AIMS: Facial and Oral Movements Muscles of Facial Expression: Severe Lips and Perioral Area: Severe Jaw: None, normal Tongue: Moderate,Extremity Movements Upper (arms, wrists, hands, fingers): Mild Lower (legs, knees, ankles, toes): None, normal, Trunk Movements Neck, shoulders, hips: None, normal, Overall Severity Severity of abnormal movements (highest score from questions above): Moderate Incapacitation due to abnormal movements: None, normal Patient's awareness of abnormal movements (rate only patient's report): No Awareness, Dental Status Current problems with teeth and/or dentures?: No Does patient usually wear dentures?: No  CIWA:    COWS:     Musculoskeletal: Strength & Muscle Tone: within normal limits Gait & Station: normal Patient leans: [name]  Psychiatric Specialty Exam: Physical Exam  Nursing note and vitals reviewed. Constitutional: He is oriented to person, place, and time. He appears well-developed and well-nourished.  HENT:  Head: Normocephalic and atraumatic.  Eyes: Conjunctivae are normal. Pupils are equal, round, and reactive to light.  Neck: Normal range of motion.  Cardiovascular: Normal heart sounds.   Respiratory: Effort normal.  GI: Soft.  Musculoskeletal: Normal range of motion.  Neurological: He is alert and oriented to person, place, and time. He displays tremor.  Patient's neck does appear to be stiff. He is able to move it but he is having pain. He  has tremors all over. Definitely has cogwheel rigidity in his arms.  Skin: Skin is warm and dry.  Psychiatric: His mood appears anxious. He is agitated, slowed and withdrawn. Thought content is paranoid. Cognition and memory are impaired. He expresses impulsivity. He expresses no homicidal and no suicidal ideation. He is noncommunicative. He is inattentive.    Review of Systems  Constitutional: Negative.   HENT: Negative.   Eyes: Negative.   Respiratory: Negative.   Cardiovascular: Negative.   Gastrointestinal: Negative.   Genitourinary: Negative.   Musculoskeletal: Positive for neck pain.  Skin: Negative.   Neurological: Positive for tremors. Negative for dizziness, tingling, sensory change, speech change, focal weakness, seizures, loss of consciousness and headaches.  Endo/Heme/Allergies: Negative.   Psychiatric/Behavioral: Negative for depression, hallucinations, memory loss, substance abuse and suicidal ideas. The patient is nervous/anxious. The patient does not have insomnia.     Blood pressure 120/71, pulse 76, temperature 98.2 F (36.8 C), resp. rate 18, height 5\' 8"  (1.727 m), weight 80.7 kg (178 lb), SpO2 99 %.Body mass index is 27.06 kg/m.  General Appearance: Disheveled  Eye Contact:  Good  Speech:  Slow  Volume:  Decreased  Mood:  Anxious and Dysphoric  Affect:  Appropriate  Thought Process:  Irrelevant  Orientation:  NA  Thought Content:  Illogical  Suicidal Thoughts:  No  Homicidal Thoughts:  No  Memory:  Immediate;   Fair Recent;   Poor Remote;   Poor  Judgement:  Poor  Insight:  Lacking  Psychomotor Activity:  Normal  Concentration:  Concentration: Fair and Attention Span: Fair  Recall:  Poor  Fund of Knowledge:  Poor  Language:  Fair  Akathisia:  No  Handed:    AIMS (if indicated):     Assets:    ADL's:  Intact  Cognition:  Impaired,  Mild  Sleep:  Number of Hours: 6.15     Treatment Plan Summary:  41 y/o male with depression, SI and  hallucinations. Multiple prior hospitalization and prior suicidal attempts. Likely Schizoaffective diosorder.   Per review of records patient has history of abusing opiates, Lyrica, Remeron and Flexeril. Here he has been frequently asking for Ativan or clonazepam.  Depressive symptoms: continue Effexor; lamictal for mood stabilization  Psychosis:Continues to have psychotic symptoms. Has not so far been able to tolerate a single medicine. Currently having what appear to probably be at least in part dystonic symptoms to antipsychotic. For now discontinuing Latuda until we can figure out what's going on medically.   Akathisia/TD: Amantadine had been transiently discontinued. Today the patient appears to be tremulous all over with stiff neck pain and cogwheel rigidity. He has been given IM doses of Benadryl and will also be restarted on his amantadine.  Insomnia: Continue benadryl   History of abusing opiates, Flexeril, Lyrica and mirtazapine. We'll benefit from continuing outpatient substance abuse services. We'll not discharge him with controlled substances.   For diabetes: Lantus 41 units at bedtime as recommended by DM coordinator. Patient has orders for supplemental insulin. Also continue metformin bid blood sugars tend to be still especially during the day. May need to have medical consult.  Hypertension: Continue lisinopril a day and Hydralazine tid  Dyslipidemia continue Zocor at bedtime  Asthma continue Singulair at bedtime  Tobacco use disorder: pt receivednicotine patch 21 mg a day  Labs: hemoglobin A1c, lipid panel, TSH all completed---we had all of these from Feb 2018  Diet carb modified and low sodium  Disposition: will be discharged back to his group home.   Follow up: continue to f/u with [facility] in [place]. Continue day program.  Will refer for case coordination patient encouraged to let nurses know how he is feeling. We are transiently  changing his medicine to try and help him feel better. He is given one IM shot of Ativan today as well.  Mordecai Rasmussen, MD 09/26/2016, 2:28 PM

## 2016-09-26 NOTE — [facility] Group Notes (Signed)
[facility] Group Notes:  (Nursing/MHT/Case Management/Adjunct)  Date:  09/26/2016  Time:  11:37 PM  Type of Therapy:  Group Therapy  Participation Level:  Did Not Attend  Participation Quality:   Summary of Progress/Problems:  Tony Cunningham 09/26/2016, 11:37 PM

## 2016-09-26 NOTE — Progress Notes (Signed)
Pt denies SI/HI/AVH. States he feels like he is "going crazy" and "feels angry". Eye contact staring and intense. Slow motor activity. Medication compliant. Minimal interaction with staff or peers. Ate evening snack. Medication compliant. Voices no additional concerns at this time. Encouragement and support provided. Safety maintained. Will continue to monitor.

## 2016-09-27 LAB — GLUCOSE, CAPILLARY
GLUCOSE-CAPILLARY: 171 mg/dL — AB (ref 65–99)
GLUCOSE-CAPILLARY: 137 mg/dL — AB (ref 65–99)
GLUCOSE-CAPILLARY: 99 mg/dL (ref 65–99)
Glucose-Capillary: 394 mg/dL — ABNORMAL HIGH (ref 65–99)

## 2016-09-27 MED ORDER — LORAZEPAM 2 MG/ML IJ SOLN
2.0000 mg | Freq: Once | INTRAMUSCULAR | Status: AC
  Administered 2016-09-27: 2 mg via INTRAMUSCULAR
  Filled 2016-09-27: qty 1

## 2016-09-27 MED ORDER — OLANZAPINE 5 MG PO TBDP
10.0000 mg | ORAL_TABLET | Freq: Once | ORAL | Status: AC
  Administered 2016-09-27: 10 mg via ORAL
  Filled 2016-09-27: qty 2

## 2016-09-27 MED ORDER — LURASIDONE HCL 40 MG PO TABS
40.0000 mg | ORAL_TABLET | Freq: Every day | ORAL | Status: DC
  Administered 2016-09-27 – 2016-09-28 (×2): 40 mg via ORAL
  Filled 2016-09-27 (×3): qty 1

## 2016-09-27 NOTE — [facility] Group Notes (Signed)
[facility] LCSW Group Therapy  09/27/2016 2:45 PM  Type of Therapy:  Group Therapy  Participation Level:  Patient did not attend group. CSW invited patient to group.   Summary of Progress/Problems: Coping Skills: Patients defined and discussed healthy coping skills. Patients identified healthy coping skills they would like to try during hospitalization and after discharge. CSW offered insight to varying coping skills that may have been new to patients such as practicing mindfulness.  Jerrion Tabbert G. Garnette Czech MSW, LCSWA 09/27/2016, 2:45 PM

## 2016-09-27 NOTE — Progress Notes (Signed)
Pt denies SI/HI/AVH. States he is "doing better than yesterday". Delayed responses. Intense eye contact. Improved affect noted from last evening. Forwards little and frequently isolates. Denies pain. Medication compliant. Voices no additional concerns at this time. Encouragement and support provided. Safety maintained. Will continue to monitor.

## 2016-09-27 NOTE — Progress Notes (Signed)
Patient looks tense and angry.  Holding self very stiff.  States "I am not doing good, I am very depressed and I feel like I am going crazy."  Patient eye contact is intense.   Delayed responses.  Denies hearing voices.  Isolating to self this shift.  Support offered.  Safety checks maintained.  Scheduled medications given.

## 2016-09-27 NOTE — Plan of Care (Signed)
Problem: Coping: Goal: Ability to cope will improve Outcome: Not Progressing Verbalizes that he feels like he is going to explode and that he does not know what he can do to help the situation.

## 2016-09-27 NOTE — Progress Notes (Signed)
[facility] MD Progress Note  09/27/2016 12:31 PM Clayton Bosserman  MRN:  [id number]  Subjective:  41 year old single male with history of schizoaffective disorder depressed type patient came voluntarily to our emergency department in the company of mobile crisis unit. He was complaining of hearing voices which were commanding him to harm himself. Patient reported that he was thinking about killing himself by running into traffic or drowning himself.  5/4. Mr Chrisangel appears anxious today. States he did not sleep well and is feeling very nervous. Experiencing shaking and hand tremors. Denies suicidality, homicidality, auditory or visual hallucinations.   Patient seen today is Saturday the fifth. Today he is complaining of severe neck pain. Also feeling tremulous all over. He was given Benadryl orally earlier in the day without any clear benefit. Continues to complain of pain. When I saw him in his room he is also complaining of just generally feeling terrible. Says he is still feeling confused and angry and mixed up. Still has hallucinations. At the same time the neck pain and the tremulousness are driving him crazy. Patient has not been violent or threatening on the unit. Generally appears to be cooperative. Just started Latuda last night which was a replacement for Geodon which replaced his previous Zyprexa  Patient seen today, Sunday the sixth. Patient this morning is complaining of the same things he complained of yesterday. He looks very agitated and wound up. Tremulous. Complains that he feels like he is going crazy and going to lose his mind. Talks about possibly feeling like he will explode. Not specifically threatening but looks very edgy. Nursing is aware of this as well. Patient has not taken any antipsychotics since yesterday and did receive resumption of his amantadine as well as Benadryl doses. I am no longer convinced that his shakiness is related to his antipsychotic medicine. Patient did have elevated  blood glucose probably from his Zyprexa. Not clear what medicines he will tolerate the best.  Per Nurse:  D: Pt denies SI/HI/AVH. Pt is pleasant and cooperative, affect is flat speech is tangential, thoughts are sometimes disorganized, he appears less anxious and he is interacting with peers and staff appropriately.   A: Pt was offered support and encouragement. Pt was given scheduled medications. Pt was encouraged to attend groups. Q 15 minute checks were done for safety.   R:Pt attends groups and interacts well with peers and staff. Pt is taking medication. Pt has no complaints.Pt receptive to treatment and safety maintained on unit  Principal Problem: Schizoaffective disorder, depressive type (HCC) Diagnosis:   Patient Active Problem List   Diagnosis Date Noted  . Suicidal ideation [R45.851] 09/23/2016  . Schizoaffective disorder, depressive type (HCC) [F25.1] 09/23/2016  . Polysubstance abuse [F19.10] 07/17/2016  . Tardive dyskinesia [G24.01] 07/16/2016  . Tobacco use disorder [F17.200] 07/07/2016  . Dyslipidemia [E78.5] 07/07/2016  . Asthma [J45.909] 07/07/2016  . HTN (hypertension) [I10] 07/06/2016  . Diabetes (HCC) [E11.9] 12/25/2010   Total Time spent with patient: 30 minutes  Past Psychiatric History: Patient has a long history of mental illness and has had multiple hospitalizations including multiple hospitalizations in [place]. Most of those records are unavailable to me. 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 Not sure whether he ever returns to an adequate baseline. He tells me that he has tried to cut himself and kill himself in the past. Denies any history of violence. He can't remember the names of any of his medicines. Outpatient medicines  listed on admission included Abilify and Effexor. Also hospitalized at [facility], [place].  Past Medical History:  Past Medical History:  Diagnosis Date  . Anxiety    . Asthma   . Diabetes mellitus   . High blood pressure   . Sinus complaint    History reviewed. No pertinent surgical history.   Family History: History reviewed. No pertinent family history.   Family Psychiatric  History: None known   Social History: Living in a group home. Has been there about 4-6 months he estimates. Says that he does not like it. 11 grade education. Does not have a legal guardian  History  Alcohol Use No     History  Drug Use No    Social History   Social History  . Marital status: Single    Spouse name: [name]  . Number of children: [name]  . Years of education: [name]   Social History Main Topics  . Smoking status: Current Every Day Smoker    Packs/day: 0.50    Types: Cigarettes  . Smokeless tobacco: Never Used  . Alcohol use No  . Drug use: No  . Sexual activity: Not Currently   Other Topics Concern  . None   Social History Narrative  . None   Sleep: Poor  Appetite:  Fair  Current Medications: Current Facility-Administered Medications  Medication Dose Route Frequency Provider Last Rate Last Dose  . acetaminophen (TYLENOL) tablet 650 mg  650 mg Oral Q6H PRN Millie Shorb, [name], MD   650 mg at 09/27/16 1121  . alum & mag hydroxide-simeth (MAALOX/MYLANTA) 200-200-20 MG/5ML suspension 30 mL  30 mL Oral Q4H PRN Linzy Darling T, MD      . amantadine (SYMMETREL) capsule 100 mg  100 mg Oral BID Massimo Hartland T, MD   100 mg at 09/27/16 0846  . hydrALAZINE (APRESOLINE) tablet 25 mg  25 mg Oral Q8H Kealohilani Maiorino T, MD   25 mg at 09/27/16 1143  . insulin aspart (novoLOG) injection 0-15 Units  0-15 Units Subcutaneous TID WC [name], MD   15 Units at 09/27/16 1142  . insulin aspart (novoLOG) injection 0-5 Units  0-5 Units Subcutaneous QHS [name], MD   3 Units at 09/26/16 2121  . insulin glargine (LANTUS) injection 41 Units  41 Units Subcutaneous QHS [name], MD   41 Units at 09/26/16 2123  . lamoTRIgine  (LAMICTAL) tablet 25 mg  25 mg Oral Daily [name], MD   25 mg at 09/27/16 0846  . lisinopril (PRINIVIL,ZESTRIL) tablet 20 mg  20 mg Oral Daily Benigna Delisi, [name], MD   20 mg at 09/27/16 0846  . magnesium hydroxide (MILK OF MAGNESIA) suspension 30 mL  30 mL Oral Daily PRN Mavery Milling T, MD      . metFORMIN (GLUCOPHAGE) tablet 1,000 mg  1,000 mg Oral BID WC Urania Pearlman, [name], MD   1,000 mg at 09/27/16 0846  . montelukast (SINGULAIR) tablet 10 mg  10 mg Oral QHS Kristyn Obyrne, [name], MD   10 mg at 09/26/16 2120  . nicotine (NICODERM CQ - dosed in mg/24 hours) patch 21 mg  21 mg Transdermal Daily [name], MD   21 mg at 09/27/16 0848  . primidone (MYSOLINE) tablet 50 mg  50 mg Oral Q8H [name], [name], MD   50 mg at 09/27/16 0626  . simvastatin (ZOCOR) tablet 40 mg  40 mg Oral q1800 Jabre Heo, [name], MD   40 mg at 09/26/16 1634  .  temazepam (RESTORIL) capsule 15 mg  15 mg Oral QHS [name], [name], MD   15 mg at 09/26/16 2120  . venlafaxine XR (EFFEXOR-XR) 24 hr capsule 150 mg  150 mg Oral Q breakfast [name], [name], MD   150 mg at 09/27/16 0846    Lab Results:  Results for orders placed or performed during the hospital encounter of 09/23/16 (from the past 48 hour(s))  Glucose, capillary     Status: Abnormal   Collection Time: 09/25/16  4:12 PM  Result Value Ref Range   Glucose-Capillary 339 (H) 65 - 99 mg/dL   Comment 1 Notify RN   Glucose, capillary     Status: Abnormal   Collection Time: 09/25/16  7:59 PM  Result Value Ref Range   Glucose-Capillary 315 (H) 65 - 99 mg/dL  Glucose, capillary     Status: None   Collection Time: 09/26/16  6:41 AM  Result Value Ref Range   Glucose-Capillary 98 65 - 99 mg/dL  Glucose, capillary     Status: Abnormal   Collection Time: 09/26/16  8:50 AM  Result Value Ref Range   Glucose-Capillary 122 (H) 65 - 99 mg/dL  Glucose, capillary     Status: Abnormal   Collection Time: 09/26/16 11:13 AM  Result Value Ref  Range   Glucose-Capillary 318 (H) 65 - 99 mg/dL   Comment 1 Notify RN   Glucose, capillary     Status: Abnormal   Collection Time: 09/26/16  4:32 PM  Result Value Ref Range   Glucose-Capillary 134 (H) 65 - 99 mg/dL  Glucose, capillary     Status: Abnormal   Collection Time: 09/26/16  8:19 PM  Result Value Ref Range   Glucose-Capillary 229 (H) 65 - 99 mg/dL  Glucose, capillary     Status: Abnormal   Collection Time: 09/27/16  6:30 AM  Result Value Ref Range   Glucose-Capillary 171 (H) 65 - 99 mg/dL  Glucose, capillary     Status: Abnormal   Collection Time: 09/27/16 11:38 AM  Result Value Ref Range   Glucose-Capillary 394 (H) 65 - 99 mg/dL   Comment 1 Notify RN     Blood Alcohol level:  Lab Results  Component Value Date   ETH <5 09/23/2016   ETH <5 07/24/2016    Metabolic Disorder Labs: Lab Results  Component Value Date   HGBA1C 11.2 (H) 09/24/2016   MPG 275 09/24/2016   MPG 321 07/07/2016   Lab Results  Component Value Date   PROLACTIN 24.5 (H) 09/24/2016   PROLACTIN 3.4 (L) 07/07/2016   Lab Results  Component Value Date   CHOL 120 07/07/2016   TRIG 153 (H) 07/07/2016   HDL 33 (L) 07/07/2016   CHOLHDL 3.6 07/07/2016   VLDL 31 07/07/2016   LDLCALC 56 07/07/2016   LDLCALC 99 02/08/2016    Physical Findings: AIMS: Facial and Oral Movements Muscles of Facial Expression: Severe Lips and Perioral Area: Severe Jaw: None, normal Tongue: Moderate,Extremity Movements Upper (arms, wrists, hands, fingers): Mild Lower (legs, knees, ankles, toes): None, normal, Trunk Movements Neck, shoulders, hips: None, normal, Overall Severity Severity of abnormal movements (highest score from questions above): Moderate Incapacitation due to abnormal movements: None, normal Patient's awareness of abnormal movements (rate only patient's report): No Awareness, Dental Status Current problems with teeth and/or dentures?: No Does patient usually wear dentures?: No  CIWA:    COWS:      Musculoskeletal: Strength & Muscle Tone: within normal limits Gait & Station: normal Patient  leans: [name]  Psychiatric Specialty Exam: Physical Exam  Nursing note and vitals reviewed. Constitutional: He is oriented to person, place, and time. He appears well-developed and well-nourished.  HENT:  Head: Normocephalic and atraumatic.  Eyes: Conjunctivae are normal. Pupils are equal, round, and reactive to light.  Neck: Normal range of motion.  Cardiovascular: Normal heart sounds.   Respiratory: Effort normal.  GI: Soft.  Musculoskeletal: Normal range of motion.  Neurological: He is alert and oriented to person, place, and time. He displays tremor.  Patient's neck does appear to be stiff. He is able to move it but he is having pain. He has tremors all over. Definitely has cogwheel rigidity in his arms.  Skin: Skin is warm and dry.  Psychiatric: His mood appears anxious. He is agitated, slowed and withdrawn. Thought content is paranoid. Cognition and memory are impaired. He expresses impulsivity. He expresses no homicidal and no suicidal ideation. He is noncommunicative. He is inattentive.    Review of Systems  Constitutional: Negative.   HENT: Negative.   Eyes: Negative.   Respiratory: Negative.   Cardiovascular: Negative.   Gastrointestinal: Negative.   Genitourinary: Negative.   Musculoskeletal: Positive for neck pain.  Skin: Negative.   Neurological: Positive for tremors. Negative for dizziness, tingling, sensory change, speech change, focal weakness, seizures, loss of consciousness and headaches.  Endo/Heme/Allergies: Negative.   Psychiatric/Behavioral: Negative for depression, hallucinations, memory loss, substance abuse and suicidal ideas. The patient is nervous/anxious. The patient does not have insomnia.     Blood pressure 113/74, pulse 78, temperature 98.7 F (37.1 C), temperature source Oral, resp. rate 18, height 5\' 8"  (1.727 m), weight 80.7 kg (178 lb), SpO2 99 %.Body  mass index is 27.06 kg/m.  General Appearance: Disheveled  Eye Contact:  Good  Speech:  Slow  Volume:  Decreased  Mood:  Anxious and Dysphoric  Affect:  Appropriate  Thought Process:  Irrelevant  Orientation:  NA  Thought Content:  Illogical  Suicidal Thoughts:  No  Homicidal Thoughts:  No  Memory:  Immediate;   Fair Recent;   Poor Remote;   Poor  Judgement:  Poor  Insight:  Lacking  Psychomotor Activity:  Normal  Concentration:  Concentration: Fair and Attention Span: Fair  Recall:  Poor  Fund of Knowledge:  Poor  Language:  Fair  Akathisia:  No  Handed:    AIMS (if indicated):     Assets:    ADL's:  Intact  Cognition:  Impaired,  Mild  Sleep:  Number of Hours: 7.45     Treatment Plan Summary:  41 y/o male with depression, SI and hallucinations. Multiple prior hospitalization and prior suicidal attempts. Likely Schizoaffective diosorder.   Per review of records patient has history of abusing opiates, Lyrica, Remeron and Flexeril. Here he has been frequently asking for Ativan or clonazepam.  Depressive symptoms: continue Effexor; lamictal for mood stabilization  Psychosis:Continues to have psychotic symptoms. Has not so far been able to tolerate a single medicine. Currently having what appear to probably be at least in part dystonic symptoms to antipsychotic. For now discontinuing Latuda until we can figure out what's going on medically.   Akathisia/TD: Amantadine had been transiently discontinued. Today the patient appears to be tremulous all over with stiff neck pain and cogwheel rigidity. He has been given IM doses of Benadryl and will also be restarted on his amantadine.  Insomnia: Continue benadryl   History of abusing opiates, Flexeril, Lyrica and mirtazapine. We'll benefit from continuing outpatient substance abuse  services. We'll not discharge him with controlled substances.   For diabetes: Lantus 41 units at bedtime as recommended by DM coordinator.  Patient has orders for supplemental insulin. Also continue metformin bid blood sugars tend to be still especially during the day. May need to have medical consult.  Hypertension: Continue lisinopril a day and Hydralazine tid  Dyslipidemia continue Zocor at bedtime  Asthma continue Singulair at bedtime  Tobacco use disorder: pt receivednicotine patch 21 mg a day  Labs: hemoglobin A1c, lipid panel, TSH all completed---we had all of these from Feb 2018  Diet carb modified and low sodium  Disposition: will be discharged back to his group home.   Follow up: continue to f/u with [facility] in [place]. Continue day program.  Will refer for case coordination patient encouraged to let nurses know how he is feeling. We are transiently changing his medicine to try and help him feel better. He is given one IM shot of Ativan today as well.  I did agree to give him another shot of Ativan today as the patient was looking very jittery. I also ordered a 1 time oral dose of Zyprexa. Reviewing his antipsychotics it is not completely clear what the best courses. At first I thought to put him back on his Zyprexa but I'm afraid of a return to high blood sugars. I will try resuming the Latuda as I am now not sure that that medicine was causing any side effects. Case reviewed with nursing.  Mordecai Rasmussen, MD 09/27/2016, 12:31 PM

## 2016-09-28 LAB — GLUCOSE, CAPILLARY
GLUCOSE-CAPILLARY: 192 mg/dL — AB (ref 65–99)
GLUCOSE-CAPILLARY: 118 mg/dL — AB (ref 65–99)
Glucose-Capillary: 166 mg/dL — ABNORMAL HIGH (ref 65–99)
Glucose-Capillary: 409 mg/dL — ABNORMAL HIGH (ref 65–99)

## 2016-09-28 MED ORDER — GABAPENTIN 100 MG PO CAPS
200.0000 mg | ORAL_CAPSULE | Freq: Three times a day (TID) | ORAL | Status: DC
  Administered 2016-09-28 – 2016-10-02 (×11): 200 mg via ORAL
  Filled 2016-09-28 (×12): qty 2

## 2016-09-28 NOTE — [facility] Group Notes (Signed)
[facility] Group Notes:  (Nursing/MHT/Case Management/Adjunct)  Date:  09/28/2016  Time:  4:11 PM  Type of Therapy:  Psychoeducational Skills  Participation Level:  Active  Participation Quality:  Appropriate  Affect:  Appropriate  Cognitive:  Alert and Oriented  Insight:  Appropriate  Engagement in Group:  Engaged  Modes of Intervention:  Discussion and Education  Summary of Progress/Problems:  Tony Cunningham 09/28/2016, 4:11 PM

## 2016-09-28 NOTE — Progress Notes (Signed)
Pt appears less anxious, stiff today. Reports feeling better, but states many times "I won't ever get out of here." Requests ativan, no orders for ativan. Continues to have delayed responses at times. Went outside with peers and participates in group discussion. Did not attend scheduled groups however. Pt is medication compliant. Reports good appetite, good sleep last night. Denies SI/HI/AVH, pain. Support and encouragement provided. Medications administered as ordered with education. Safety maintained with every 15 minute checks. Will continue to monitor.

## 2016-09-28 NOTE — [facility] Group Notes (Signed)
[facility] LCSW Group Therapy   09/28/2016 1:00 pm Type of Therapy: Group Therapy   Participation Level: CSW invited patient to group. Patient did not attend.   Hampton Abbot, MSW, LCSWA 09/28/2016, 2:09PM

## 2016-09-28 NOTE — Plan of Care (Signed)
Problem: Self-Concept: Goal: Level of anxiety will decrease Outcome: Progressing Appears less anxious, less stiff. Takes medications as ordered.

## 2016-09-28 NOTE — Progress Notes (Signed)
Patient continues to have the intense eye contact and delay in response.Denies suicidal or homicidal ideations and AV hallucinations.Isolated in room most of the time.Compliant with medications.Did not attended groups.Appetite & energy level good.Support & encouragement given.

## 2016-09-28 NOTE — Progress Notes (Signed)
Recreation Therapy Notes  Date: 05.07.18 Time: 9:30 am Location: Craft Room  Group Topic: Self-expression  Goal Area(s) Addresses:  Patient will effectively use art as a means of self-expression. Patient will recognize positive benefit of self-expression. Patient will be able to identify one emotion experienced during group session. Patient will identify use art as a coping skill.  Behavioral Response: Did not attend  Intervention: Two Faces of Me  Activity: Patients were given a blank face worksheet and were instructed to draw a line down the middle. On one side, patients were instructed to draw or write how they felt when they were admitted to the hospital. On the other side, patients were instructed to draw or write how they want to feel when they are d/c.  Education: LRT educated patients on other forms of self-expression.  Education Outcome: Patient did not attend group.  Clinical Observations/Feedback: Patient did not attend group.  Jacquelynn Cree, LRT/CTRS 09/28/2016 10:00 AM

## 2016-09-28 NOTE — Progress Notes (Signed)
[facility] MD Progress Note  09/28/2016 7:56 PM Tony Cunningham  MRN:  [id number]  Subjective:  41 year old single male with history of schizoaffective disorder depressed type patient came voluntarily to our emergency department in the company of mobile crisis unit. He was complaining of hearing voices which were commanding him to harm himself. Patient reported that he was thinking about killing himself by running into traffic or drowning himself.  5/4. Tony Cunningham appears anxious today. States he did not sleep well and is feeling very nervous. Experiencing shaking and hand tremors. Denies suicidality, homicidality, auditory or visual hallucinations.   Patient seen today is Saturday the fifth. Today he is complaining of severe neck pain. Also feeling tremulous all over. He was given Benadryl orally earlier in the day without any clear benefit. Continues to complain of pain. When I saw him in his room he is also complaining of just generally feeling terrible. Says he is still feeling confused and angry and mixed up. Still has hallucinations. At the same time the neck pain and the tremulousness are driving him crazy. Patient has not been violent or threatening on the unit. Generally appears to be cooperative. Just started Latuda last night which was a replacement for Geodon which replaced his previous Zyprexa  Patient seen today, Sunday the sixth. Patient this morning is complaining of the same things he complained of yesterday. He looks very agitated and wound up. Tremulous. Complains that he feels like he is going crazy and going to lose his mind. Talks about possibly feeling like he will explode. Not specifically threatening but looks very edgy. Nursing is aware of this as well. Patient has not taken any antipsychotics since yesterday and did receive resumption of his amantadine as well as Benadryl doses. I am no longer convinced that his shakiness is related to his antipsychotic medicine. Patient did have elevated  blood glucose probably from his Zyprexa. Not clear what medicines he will tolerate the best.  09/28/2016. Tony Cunningham is in his room flat on his belly most of the daay. When approached, he complains of severe anxiety and is asking for injection of Ativan. This was given over the weekend with improvement. I will not offer benzodiazepines or painkillers for neck pain. He complains of feeling depressed, restless and anxious. He denies auditory hallucinations. Sleep and appetite are fair,  Per Nurse:  Pt appears less anxious, stiff today. Reports feeling better, but states many times "I won't ever get out of here." Requests ativan, no orders for ativan. Continues to have delayed responses at times. Went outside with peers and participates in group discussion. Did not attend scheduled groups however. Pt is medication compliant. Reports good appetite, good sleep last night. Denies SI/HI/AVH, pain. Support and encouragement provided. Medications administered as ordered with education. Safety maintained with every 15 minute checks. Will continue to monitor.   Principal Problem: Schizoaffective disorder, depressive type (HCC) Diagnosis:   Patient Active Problem List   Diagnosis Date Noted  . Suicidal ideation [R45.851] 09/23/2016  . Schizoaffective disorder, depressive type (HCC) [F25.1] 09/23/2016  . Polysubstance abuse [F19.10] 07/17/2016  . Tardive dyskinesia [G24.01] 07/16/2016  . Tobacco use disorder [F17.200] 07/07/2016  . Dyslipidemia [E78.5] 07/07/2016  . Asthma [J45.909] 07/07/2016  . HTN (hypertension) [I10] 07/06/2016  . Diabetes (HCC) [E11.9] 12/25/2010   Total Time spent with patient: 30 minutes  Past Psychiatric History: Patient has a long history of mental illness and has had multiple hospitalizations including multiple hospitalizations in [place]. Most of those records are unavailable  to me. 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 Not sure whether he ever returns to an adequate baseline. He tells me that he has tried to cut himself and kill himself in the past. Denies any history of violence. He can't remember the names of any of his medicines. Outpatient medicines listed on admission included Abilify and Effexor. Also hospitalized at [facility], [place].  Past Medical History:  Past Medical History:  Diagnosis Date  . Anxiety   . Asthma   . Diabetes mellitus   . High blood pressure   . Sinus complaint    History reviewed. No pertinent surgical history.   Family History: History reviewed. No pertinent family history.   Family Psychiatric  History: None known   Social History: Living in a group home. Has been there about 4-6 months he estimates. Says that he does not like it. 11 grade education. Does not have a legal guardian  History  Alcohol Use No     History  Drug Use No    Social History   Social History  . Marital status: Single    Spouse name: [name]  . Number of children: [name]  . Years of education: [name]   Social History Main Topics  . Smoking status: Current Every Day Smoker    Packs/day: 0.50    Types: Cigarettes  . Smokeless tobacco: Never Used  . Alcohol use No  . Drug use: No  . Sexual activity: Not Currently   Other Topics Concern  . None   Social History Narrative  . None   Sleep: Poor  Appetite:  Fair  Current Medications: Current Facility-Administered Medications  Medication Dose Route Frequency Provider Last Rate Last Dose  . acetaminophen (TYLENOL) tablet 650 mg  650 mg Oral Q6H PRN [name], [name], MD   650 mg at 09/27/16 1121  . alum & mag hydroxide-simeth (MAALOX/MYLANTA) 200-200-20 MG/5ML suspension 30 mL  30 mL Oral Q4H PRN [name], [name], MD      . amantadine (SYMMETREL) capsule 100 mg  100 mg Oral BID [name], [name], MD   100 mg at 09/28/16 1631  . gabapentin (NEURONTIN) capsule 200 mg  200 mg Oral TID Yicel Shannon B, MD   200 mg at 09/28/16  1631  . hydrALAZINE (APRESOLINE) tablet 25 mg  25 mg Oral Q8H [name], [name], MD   25 mg at 09/28/16 1449  . insulin aspart (novoLOG) injection 0-15 Units  0-15 Units Subcutaneous TID WC [name], MD   3 Units at 09/28/16 1150  . insulin aspart (novoLOG) injection 0-5 Units  0-5 Units Subcutaneous QHS [name], MD   3 Units at 09/26/16 2121  . insulin glargine (LANTUS) injection 41 Units  41 Units Subcutaneous QHS [name], MD   41 Units at 09/27/16 2126  . lamoTRIgine (LAMICTAL) tablet 25 mg  25 mg Oral Daily [name], MD   25 mg at 09/28/16 0835  . lisinopril (PRINIVIL,ZESTRIL) tablet 20 mg  20 mg Oral Daily [name], [name], MD   20 mg at 09/28/16 0835  . lurasidone (LATUDA) tablet 40 mg  40 mg Oral QHS [name], [name], MD   40 mg at 09/27/16 2124  . magnesium hydroxide (MILK OF MAGNESIA) suspension 30 mL  30 mL Oral Daily PRN [name], [name], MD      . metFORMIN (GLUCOPHAGE) tablet 1,000 mg  1,000 mg Oral BID WC [name], John  T, MD   1,000 mg at 09/28/16 1631  . montelukast (SINGULAIR) tablet 10 mg  10 mg Oral QHS [name], [name], MD   10 mg at 09/27/16 2124  . nicotine (NICODERM CQ - dosed in mg/24 hours) patch 21 mg  21 mg Transdermal Daily [name], MD   21 mg at 09/28/16 0835  . simvastatin (ZOCOR) tablet 40 mg  40 mg Oral q1800 [name], [name], MD   40 mg at 09/28/16 1631  . temazepam (RESTORIL) capsule 15 mg  15 mg Oral QHS Clifton Kovacic B, MD   15 mg at 09/27/16 2124  . venlafaxine XR (EFFEXOR-XR) 24 hr capsule 150 mg  150 mg Oral Q breakfast Sindi Beckworth B, MD   150 mg at 09/28/16 2707    Lab Results:  Results for orders placed or performed during the hospital encounter of 09/23/16 (from the past 48 hour(s))  Glucose, capillary     Status: Abnormal   Collection Time: 09/26/16  8:19 PM  Result Value Ref Range   Glucose-Capillary 229 (H) 65 - 99 mg/dL  Glucose, capillary     Status:  Abnormal   Collection Time: 09/27/16  6:30 AM  Result Value Ref Range   Glucose-Capillary 171 (H) 65 - 99 mg/dL  Glucose, capillary     Status: Abnormal   Collection Time: 09/27/16 11:38 AM  Result Value Ref Range   Glucose-Capillary 394 (H) 65 - 99 mg/dL   Comment 1 Notify RN   Glucose, capillary     Status: Abnormal   Collection Time: 09/27/16  4:39 PM  Result Value Ref Range   Glucose-Capillary 137 (H) 65 - 99 mg/dL   Comment 1 Notify RN   Glucose, capillary     Status: None   Collection Time: 09/27/16  9:02 PM  Result Value Ref Range   Glucose-Capillary 99 65 - 99 mg/dL   Comment 1 Notify RN   Glucose, capillary     Status: Abnormal   Collection Time: 09/28/16  6:28 AM  Result Value Ref Range   Glucose-Capillary 166 (H) 65 - 99 mg/dL  Glucose, capillary     Status: Abnormal   Collection Time: 09/28/16 11:47 AM  Result Value Ref Range   Glucose-Capillary 192 (H) 65 - 99 mg/dL  Glucose, capillary     Status: Abnormal   Collection Time: 09/28/16  4:29 PM  Result Value Ref Range   Glucose-Capillary 118 (H) 65 - 99 mg/dL    Blood Alcohol level:  Lab Results  Component Value Date   ETH <5 09/23/2016   ETH <5 07/24/2016    Metabolic Disorder Labs: Lab Results  Component Value Date   HGBA1C 11.2 (H) 09/24/2016   MPG 275 09/24/2016   MPG 321 07/07/2016   Lab Results  Component Value Date   PROLACTIN 24.5 (H) 09/24/2016   PROLACTIN 3.4 (L) 07/07/2016   Lab Results  Component Value Date   CHOL 120 07/07/2016   TRIG 153 (H) 07/07/2016   HDL 33 (L) 07/07/2016   CHOLHDL 3.6 07/07/2016   VLDL 31 07/07/2016   LDLCALC 56 07/07/2016   LDLCALC 99 02/08/2016    Physical Findings: AIMS: Facial and Oral Movements Muscles of Facial Expression: Severe Lips and Perioral Area: Severe Jaw: None, normal Tongue: Moderate,Extremity Movements Upper (arms, wrists, hands, fingers): Mild Lower (legs, knees, ankles, toes): None, normal, Trunk Movements Neck, shoulders, hips:  None, normal, Overall Severity Severity of abnormal movements (highest score from questions above): Moderate Incapacitation due  to abnormal movements: None, normal Patient's awareness of abnormal movements (rate only patient's report): No Awareness, Dental Status Current problems with teeth and/or dentures?: No Does patient usually wear dentures?: No  CIWA:    COWS:     Musculoskeletal: Strength & Muscle Tone: within normal limits Gait & Station: normal Patient leans: [name]  Psychiatric Specialty Exam: Physical Exam  Nursing note and vitals reviewed. Constitutional: He appears well-nourished.  Neurological: He displays tremor.  Patient's neck does appear to be stiff. He is able to move it but he is having pain. He has tremors all over. Definitely has cogwheel rigidity in his arms.  Psychiatric: His speech is normal. His mood appears anxious. He is withdrawn. Thought content is paranoid. Cognition and memory are normal. He expresses impulsivity. He expresses no homicidal ideation.    Review of Systems  Musculoskeletal: Positive for neck pain.  Neurological: Positive for tremors.  Psychiatric/Behavioral: The patient is nervous/anxious.     Blood pressure 110/75, pulse 91, temperature 98.3 F (36.8 C), temperature source Oral, resp. rate 19, height 5\' 8"  (1.727 m), weight 80.7 kg (178 lb), SpO2 99 %.Body mass index is 27.06 kg/m.  General Appearance: Disheveled  Eye Contact:  Good  Speech:  Slow  Volume:  Decreased  Mood:  Anxious and Dysphoric  Affect:  Appropriate  Thought Process:  Irrelevant  Orientation:  NA  Thought Content:  Illogical  Suicidal Thoughts:  No  Homicidal Thoughts:  No  Memory:  Immediate;   Fair Recent;   Poor Remote;   Poor  Judgement:  Poor  Insight:  Lacking  Psychomotor Activity:  Normal  Concentration:  Concentration: Fair and Attention Span: Fair  Recall:  Poor  Fund of Knowledge:  Poor  Language:  Fair  Akathisia:  No  Handed:    AIMS (if  indicated):     Assets:    ADL's:  Intact  Cognition:  Impaired,  Mild  Sleep:  Number of Hours: 8.15     Treatment Plan Summary:  Tony. Is a 41 year old male with a history of schizoaffective disorder admitted for worsening depression, SI and hallucinations. There is also a history of abusing opiates, Lyrica, Remeron and Flexeril.  1. Suicidal ideation. The patient is able to contract for safety in the hospital.  2. Mood and psychosis. We lowered Effexor XR to 150 mg a day for depression and started Lamictal for mood stabilization. We discontinued olanzapine due to poorly controlled diabetes and started Geodon 60 mg twice daily. Geodon was discontinued due to what appeared like akathisia. We started 45.  3. EPS. We continued amantadine 100 mg po bid  4. Insomnia. Slept better with Restoril.   5. History of abusing opiates, Flexeril, Lyrica and mirtazapine. We'll benefit from continuing outpatient substance abuse services. We'll not discharge him with controlled substances.   6. Diabetes. We continued Metformin, increased Lantus to 41 units at bedtime as recommended by DM coordinator, continue ADA, SSI and blood glucose monitoring.   7. Hypertension. Continue lisinopril and Hydralazine.  8. Dyslipidemia. Continue Zocor.   9. Asthma. Continue Singulair 10 mg at bedtime  10. Tobacco use disorder. Nicotine patch is available.   11. Metabolic syndrome monitoring. Hemoglobin A1c, lipid panel, TSH all completed in Feb 2018.  12. EKG. Normal sinus rhythm, QTc 433.  13. Disposition. He will be discharged back to his group home. He will follow up with CB and continue day program. Will refer for case coordination.    Mar 2018, MD 09/28/2016, 7:56  PM

## 2016-09-28 NOTE — [facility] Suicide Risk Assessment (Addendum)
[facility] INPATIENT:  Family/Significant Other Suicide Prevention Education  Suicide Prevention Education:  Contact Attempts: [name], mother, [phone], has been identified by the patient as the family member/significant other with whom the patient will be residing, and identified as the person(s) who will aid the patient in the event of a mental health crisis.  With written consent from the patient, two attempts were made to provide suicide prevention education, prior to and/or following the patient's discharge.  We were unsuccessful in providing suicide prevention education.  A suicide education pamphlet was given to the patient to share with family/significant other.  Date and time of first attempt:09/28/16, 66 Date and time of second attempt: 09/29/16, 1027  [name], LCSW 09/28/2016, 1:27 PM

## 2016-09-29 LAB — GLUCOSE, CAPILLARY
GLUCOSE-CAPILLARY: 157 mg/dL — AB (ref 65–99)
GLUCOSE-CAPILLARY: 323 mg/dL — AB (ref 65–99)
Glucose-Capillary: 365 mg/dL — ABNORMAL HIGH (ref 65–99)
Glucose-Capillary: 282 mg/dL — ABNORMAL HIGH (ref 65–99)

## 2016-09-29 MED ORDER — LURASIDONE HCL 40 MG PO TABS
80.0000 mg | ORAL_TABLET | Freq: Every day | ORAL | Status: DC
  Administered 2016-09-29 – 2016-10-02 (×4): 80 mg via ORAL
  Filled 2016-09-29 (×4): qty 2

## 2016-09-29 MED ORDER — TEMAZEPAM 15 MG PO CAPS
30.0000 mg | ORAL_CAPSULE | Freq: Every day | ORAL | Status: DC
  Administered 2016-09-29 – 2016-10-02 (×4): 30 mg via ORAL
  Filled 2016-09-29 (×4): qty 2

## 2016-09-29 NOTE — Plan of Care (Signed)
Problem: Education: Goal: Utilization of techniques to improve thought processes will improve Outcome: Progressing Pt attended group today and stayed out of bed. Voiced his goal was to stay out of bed "to feel better." Improvement noted.

## 2016-09-29 NOTE — Progress Notes (Signed)
Recreation Therapy Notes  Date: 05.08.18 Time: 9:30 am Location: Craft Room  Group Topic: Goal Setting  Goal Area(s) Addresses:  Patient will identify a personal goal. Patient will identify at least one supportive comment.  Behavioral Response: Did not attend  Intervention: Step By Step  Activity: Patients were given a worksheet with a foot on it. Patients were instructed to write their goal inside the foot and supportive comments outside of the foot.  Education: LRT educated patients on healthy ways they can celebrate reaching their goals.  Education Outcome: Patient did not attend group.  Clinical Observations/Feedback: Patient did not attend group.  Jacquelynn Cree, LRT/CTRS 09/29/2016 10:00 AM

## 2016-09-29 NOTE — [facility] Group Notes (Signed)
[facility] LCSW Group Therapy Note  Date/Time: 09/29/16, 1500  Type of Therapy/Topic:  Group Therapy:  Feelings about Diagnosis  Participation Level:  Active   Mood: pleasant   Description of Group:    This group will allow patients to explore their thoughts and feelings about diagnoses they have received. Patients will be guided to explore their level of understanding and acceptance of these diagnoses. Facilitator will encourage patients to process their thoughts and feelings about the reactions of others to their diagnosis, and will guide patients in identifying ways to discuss their diagnosis with significant others in their lives. This group will be process-oriented, with patients participating in exploration of their own experiences as well as giving and receiving support and challenge from other group members.   Therapeutic Goals: 1. Patient will demonstrate understanding of diagnosis as evidence by identifying two or more symptoms of the disorder:  2. Patient will be able to express two feelings regarding the diagnosis 3. Patient will demonstrate ability to communicate their needs through discussion and/or role plays  Summary of Patient Progress: PT had good participation today in group.  He shared with the group that he has a bipolar diagnosis and asked CSW to go through the symptoms of bipolar disorder as well.  He lost focus towards the end but this was improvement over what CSW has seen previously.        Therapeutic Modalities:   Cognitive Behavioral Therapy Brief Therapy Feelings Identification   Daleen Squibb, LCSW

## 2016-09-29 NOTE — [facility] Group Notes (Signed)
[facility] Group Notes:  (Nursing/MHT/Case Management/Adjunct)  Date:  09/29/2016  Time:  6:23 AM  Type of Therapy:  Group Therapy  Participation Level:  Active  Participation Quality:  Appropriate  Affect:  Appropriate  Cognitive:  Appropriate  Insight:  Appropriate  Engagement in Group:  Engaged  Modes of Intervention:  n/a  Summary of Progress/Problems:  Veva Holes 09/29/2016, 6:23 AM

## 2016-09-29 NOTE — Plan of Care (Signed)
Problem: [facility] Participation in Recreation Therapeutic Interventions Goal: STG-Patient will demonstrate improved self esteem by identif STG: Self-Esteem - Within 4 treatment sessions, patient will verbalize at least 5 positive affirmation statements in each of 2 treatment sessions to increase self-esteem.  Outcome: Progressing Treatment Session 1; Completed 1 out of 2: At approximately 4:20 pm, LRT met with patient in patient room. Patient verbalized 5 positive affirmation statements. Patient reported it felt "good". LRT encouraged patient to continue saying positive affirmation statements.  Leonette Monarch, LRT/CTRS 05.08.18 4:35 pm

## 2016-09-29 NOTE — Progress Notes (Signed)
Attempted to discuss high A1C with patient. Mental Health Aid with me as I had a discussion with the patient as I was  uncomfortable with the patient (When I initially asked how he was, he stated "not good")  When I asked what he took at home for his diabetes, he reports Novolog 20 units at supper (none listed on his medication record)and he reports no other medications.  When I discussed the high A1C of 11.2%, he asked who got his blood and when. Re-assured it was with his other blood work draw- Mental Health Aide assisted in deescalating the patient's concerns.  I did not discuss his diabetes any further.   Susette Racer, RN, BA, MHA, CDE Diabetes Coordinator Inpatient Diabetes Program  [phone] (Team Pager) [phone] [facility] Office) 09/29/2016 1:31 PM

## 2016-09-29 NOTE — Plan of Care (Signed)
Problem: Coping: Goal: Ability to verbalize feelings will improve Outcome: Progressing Patient verbalized his feelings has improved .

## 2016-09-29 NOTE — Progress Notes (Signed)
Referral to care coordination made to [facility], [place]. Garner Nash, MSW, LCSW Clinical Social Worker 09/29/2016 10:27 AM

## 2016-09-29 NOTE — Progress Notes (Signed)
Pt appears much more relaxed today, appropriately interacts with staff/peers. Attends some groups. Some responses continue to be delayed, but others are sudden, depending on the conversation. Vacant stare. Continues to state, "I feel like I'm going to go crazy, am I crazy?" Pt is not able to tell me what he thinks/feels makes him "crazy." Fixated on taking ativan. Denies SI/HI/AVH. Pleasant, calm, cooperative. Pt is medication compliant. VS stable. Support and encouragement provided. Medications administered as ordered with education. Safety maintained with every 15 minute checks. Will continue to monitor.

## 2016-09-29 NOTE — Progress Notes (Signed)
[facility] MD Progress Note  09/29/2016 6:24 PM Tony Cunningham  MRN:  [id number]  Subjective:  41 year old single male with history of schizoaffective disorder depressed type patient came voluntarily to our emergency department in the company of mobile crisis unit. He was complaining of hearing voices which were commanding him to harm himself. Patient reported that he was thinking about killing himself by running into traffic or drowning himself.  5/4. Tony Cunningham appears anxious today. States he did not sleep well and is feeling very nervous. Experiencing shaking and hand tremors. Denies suicidality, homicidality, auditory or visual hallucinations.   Patient seen today is Saturday the fifth. Today he is complaining of severe neck pain. Also feeling tremulous all over. He was given Benadryl orally earlier in the day without any clear benefit. Continues to complain of pain. When I saw him in his room he is also complaining of just generally feeling terrible. Says he is still feeling confused and angry and mixed up. Still has hallucinations. At the same time the neck pain and the tremulousness are driving him crazy. Patient has not been violent or threatening on the unit. Generally appears to be cooperative. Just started Latuda last night which was a replacement for Geodon which replaced his previous Zyprexa  Patient seen today, Sunday the sixth. Patient this morning is complaining of the same things he complained of yesterday. He looks very agitated and wound up. Tremulous. Complains that he feels like he is going crazy and going to lose his mind. Talks about possibly feeling like he will explode. Not specifically threatening but looks very edgy. Nursing is aware of this as well. Patient has not taken any antipsychotics since yesterday and did receive resumption of his amantadine as well as Benadryl doses. I am no longer convinced that his shakiness is related to his antipsychotic medicine. Patient did have elevated  blood glucose probably from his Zyprexa. Not clear what medicines he will tolerate the best.  09/28/2016. Tony Cunningham is in his room flat on his belly most of the daay. When approached, he complains of severe anxiety and is asking for injection of Ativan. This was given over the weekend with improvement. I will not offer benzodiazepines or painkillers for neck pain. He complains of feeling depressed, restless and anxious. He denies auditory hallucinations. Sleep and appetite are fair.  09/29/2016. Tony Cunningham remains irritable, suspicious, edgy. He made odd comments to diabetes nurse coordinator and was unable to discuss high HgbA1C today. He keeps mostly to his room and hardly interacts with peers or staff. He was aking for Ativan injections today again. I do not see any tremors, stiffness or restlessness. He agreed to increase Latuda. He is not open to a mood stabilizer. Complains of poor sleep. There are no somatic complaints.  Per Nurse:  D: Pt denies SI/HI/AVH, affect is flat. Patient's thought are organized, with some delayed response noted during conversation, able to follow simple commands and receptive on the unit. Pt is pleasant and cooperative. Pt appears less anxious and he is interacting with peers and staff appropriately.  A: Pt was offered support and encouragement. Pt was given scheduled medications. Pt was encouraged to attend groups. Q 15 minute checks were done for safety.  R:Pt attends groups and interacts well with peers and staff. Pt is taking medication. Pt has no complaints.Pt receptive to treatment and safety maintained on unit.  Principal Problem: Schizoaffective disorder, depressive type (HCC) Diagnosis:   Patient Active Problem List   Diagnosis Date Noted  .  Suicidal ideation [R45.851] 09/23/2016  . Schizoaffective disorder, depressive type (HCC) [F25.1] 09/23/2016  . Polysubstance abuse [F19.10] 07/17/2016  . Tardive dyskinesia [G24.01] 07/16/2016  . Tobacco use disorder  [F17.200] 07/07/2016  . Dyslipidemia [E78.5] 07/07/2016  . Asthma [J45.909] 07/07/2016  . HTN (hypertension) [I10] 07/06/2016  . Diabetes (HCC) [E11.9] 12/25/2010   Total Time spent with patient: 30 minutes  Past Psychiatric History: Patient has a long history of mental illness and has had multiple hospitalizations including multiple hospitalizations in [place]. Most of those records are unavailable to me. 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 Not sure whether he ever returns to an adequate baseline. He tells me that he has tried to cut himself and kill himself in the past. Denies any history of violence. He can't remember the names of any of his medicines. Outpatient medicines listed on admission included Abilify and Effexor. Also hospitalized at [facility] [place].  Past Medical History:  Past Medical History:  Diagnosis Date  . Anxiety   . Asthma   . Diabetes mellitus   . High blood pressure   . Sinus complaint    History reviewed. No pertinent surgical history.   Family History: History reviewed. No pertinent family history.   Family Psychiatric  History: None known   Social History: Living in a group home. Has been there about 4-6 months he estimates. Says that he does not like it. 11 grade education. Does not have a legal guardian  History  Alcohol Use No     History  Drug Use No    Social History   Social History  . Marital status: Single    Spouse name: [name]  . Number of children: [name]  . Years of education: [name]   Social History Main Topics  . Smoking status: Current Every Day Smoker    Packs/day: 0.50    Types: Cigarettes  . Smokeless tobacco: Never Used  . Alcohol use No  . Drug use: No  . Sexual activity: Not Currently   Other Topics Concern  . None   Social History Narrative  . None   Sleep: Poor  Appetite:  Fair  Current Medications: Current Facility-Administered Medications   Medication Dose Route Frequency Provider Last Rate Last Dose  . acetaminophen (TYLENOL) tablet 650 mg  650 mg Oral Q6H PRN [name], [name], MD   650 mg at 09/29/16 0843  . alum & mag hydroxide-simeth (MAALOX/MYLANTA) 200-200-20 MG/5ML suspension 30 mL  30 mL Oral Q4H PRN [name], [name], MD      . amantadine (SYMMETREL) capsule 100 mg  100 mg Oral BID [name], [name], MD   100 mg at 09/29/16 1636  . gabapentin (NEURONTIN) capsule 200 mg  200 mg Oral TID Gean Laursen B, MD   200 mg at 09/29/16 1636  . hydrALAZINE (APRESOLINE) tablet 25 mg  25 mg Oral Q8H [name], [name], MD   25 mg at 09/29/16 1456  . insulin aspart (novoLOG) injection 0-15 Units  0-15 Units Subcutaneous TID WC [name], MD   15 Units at 09/29/16 1636  . insulin aspart (novoLOG) injection 0-5 Units  0-5 Units Subcutaneous QHS [name], MD   5 Units at 09/28/16 2147  . insulin glargine (LANTUS) injection 41 Units  41 Units Subcutaneous QHS [name], MD   41 Units at 09/28/16 2148  . lamoTRIgine (LAMICTAL) tablet 25 mg  25 mg Oral Daily [name], MD  25 mg at 09/29/16 0746  . lisinopril (PRINIVIL,ZESTRIL) tablet 20 mg  20 mg Oral Daily [name], [name], MD   20 mg at 09/29/16 0746  . lurasidone (LATUDA) tablet 80 mg  80 mg Oral Q supper Britnay Magnussen B, MD   80 mg at 09/29/16 1809  . magnesium hydroxide (MILK OF MAGNESIA) suspension 30 mL  30 mL Oral Daily PRN [name], [name], MD      . metFORMIN (GLUCOPHAGE) tablet 1,000 mg  1,000 mg Oral BID WC [name], [name], MD   1,000 mg at 09/29/16 1636  . montelukast (SINGULAIR) tablet 10 mg  10 mg Oral QHS [name], [name], MD   10 mg at 09/28/16 2148  . nicotine (NICODERM CQ - dosed in mg/24 hours) patch 21 mg  21 mg Transdermal Daily [name], MD   21 mg at 09/29/16 0751  . simvastatin (ZOCOR) tablet 40 mg  40 mg Oral q1800 [name], [name], MD   40 mg at 09/29/16 1636  . temazepam  (RESTORIL) capsule 30 mg  30 mg Oral QHS Darcee Dekker B, MD      . venlafaxine XR (EFFEXOR-XR) 24 hr capsule 150 mg  150 mg Oral Q breakfast Kedarius Aloisi B, MD   150 mg at 09/29/16 0746    Lab Results:  Results for orders placed or performed during the hospital encounter of 09/23/16 (from the past 48 hour(s))  Glucose, capillary     Status: None   Collection Time: 09/27/16  9:02 PM  Result Value Ref Range   Glucose-Capillary 99 65 - 99 mg/dL   Comment 1 Notify RN   Glucose, capillary     Status: Abnormal   Collection Time: 09/28/16  6:28 AM  Result Value Ref Range   Glucose-Capillary 166 (H) 65 - 99 mg/dL  Glucose, capillary     Status: Abnormal   Collection Time: 09/28/16 11:47 AM  Result Value Ref Range   Glucose-Capillary 192 (H) 65 - 99 mg/dL  Glucose, capillary     Status: Abnormal   Collection Time: 09/28/16  4:29 PM  Result Value Ref Range   Glucose-Capillary 118 (H) 65 - 99 mg/dL  Glucose, capillary     Status: Abnormal   Collection Time: 09/28/16  9:44 PM  Result Value Ref Range   Glucose-Capillary 409 (H) 65 - 99 mg/dL  Glucose, capillary     Status: Abnormal   Collection Time: 09/29/16  6:51 AM  Result Value Ref Range   Glucose-Capillary 157 (H) 65 - 99 mg/dL  Glucose, capillary     Status: Abnormal   Collection Time: 09/29/16 11:42 AM  Result Value Ref Range   Glucose-Capillary 323 (H) 65 - 99 mg/dL  Glucose, capillary     Status: Abnormal   Collection Time: 09/29/16  4:33 PM  Result Value Ref Range   Glucose-Capillary 365 (H) 65 - 99 mg/dL    Blood Alcohol level:  Lab Results  Component Value Date   ETH <5 09/23/2016   ETH <5 07/24/2016    Metabolic Disorder Labs: Lab Results  Component Value Date   HGBA1C 11.2 (H) 09/24/2016   MPG 275 09/24/2016   MPG 321 07/07/2016   Lab Results  Component Value Date   PROLACTIN 24.5 (H) 09/24/2016   PROLACTIN 3.4 (L) 07/07/2016   Lab Results  Component Value Date   CHOL 120 07/07/2016   TRIG  153 (H) 07/07/2016   HDL 33 (L) 07/07/2016   CHOLHDL 3.6 07/07/2016  VLDL 31 07/07/2016   LDLCALC 56 07/07/2016   LDLCALC 99 02/08/2016    Physical Findings: AIMS: Facial and Oral Movements Muscles of Facial Expression: Severe Lips and Perioral Area: Severe Jaw: None, normal Tongue: Moderate,Extremity Movements Upper (arms, wrists, hands, fingers): Mild Lower (legs, knees, ankles, toes): None, normal, Trunk Movements Neck, shoulders, hips: None, normal, Overall Severity Severity of abnormal movements (highest score from questions above): Moderate Incapacitation due to abnormal movements: None, normal Patient's awareness of abnormal movements (rate only patient's report): No Awareness, Dental Status Current problems with teeth and/or dentures?: No Does patient usually wear dentures?: No  CIWA:    COWS:     Musculoskeletal: Strength & Muscle Tone: within normal limits Gait & Station: normal Patient leans: [name]  Psychiatric Specialty Exam: Physical Exam  Nursing note and vitals reviewed. Constitutional: He appears well-nourished.  Neurological: He displays tremor.  Patient's neck does appear to be stiff. He is able to move it but he is having pain. He has tremors all over. Definitely has cogwheel rigidity in his arms.  Psychiatric: His speech is normal. His mood appears anxious. He is withdrawn. Thought content is paranoid. Cognition and memory are normal. He expresses impulsivity. He expresses no homicidal ideation.    Review of Systems  Musculoskeletal: Positive for neck pain.  Neurological: Positive for tremors.  Psychiatric/Behavioral: The patient is nervous/anxious.     Blood pressure 115/76, pulse 69, temperature 98.5 F (36.9 C), temperature source Oral, resp. rate 18, height 5\' 8"  (1.727 m), weight 80.7 kg (178 lb), SpO2 99 %.Body mass index is 27.06 kg/m.  General Appearance: Disheveled  Eye Contact:  Good  Speech:  Slow  Volume:  Decreased  Mood:  Anxious and  Dysphoric  Affect:  Appropriate  Thought Process:  Irrelevant  Orientation:  NA  Thought Content:  Illogical  Suicidal Thoughts:  No  Homicidal Thoughts:  No  Memory:  Immediate;   Fair Recent;   Poor Remote;   Poor  Judgement:  Poor  Insight:  Lacking  Psychomotor Activity:  Normal  Concentration:  Concentration: Fair and Attention Span: Fair  Recall:  Poor  Fund of Knowledge:  Poor  Language:  Fair  Akathisia:  No  Handed:    AIMS (if indicated):     Assets:    ADL's:  Intact  Cognition:  Impaired,  Mild  Sleep:  Number of Hours: 6.5     Treatment Plan Summary:  Tony. Ghosh is a 41 year old male with a history of schizoaffective disorder admitted for worsening depression, SI and hallucinations. There is also a history of abusing opiates, Lyrica, Remeron and Flexeril.  1. Suicidal ideation. The patient is able to contract for safety in the hospital.  2. Mood and psychosis. We lowered Effexor XR to 150 mg a day for depression and started Lamictal for mood stabilization. We discontinued olanzapine due to poorly controlled diabetes and briefly gave Geodon. Geodon was discontinued due to what appeared to be akathisia. We started 45. I will increase it to 80 mg.  3. EPS. We continued amantadine 100 mg po bid  4. Insomnia. We increased Restoril to 30 mg..   5. History of abusing opiates, Flexeril, Lyrica and mirtazapine. We'll benefit from continuing outpatient substance abuse services. We'll not discharge him with controlled substances.   6. Diabetes. We continued Metformin, increased Lantus to 41 units at bedtime as recommended by DM coordinator, continue ADA, SSI and blood glucose monitoring.   7. Hypertension. Continue lisinopril and Hydralazine.  8.  Dyslipidemia. Continue Zocor.   9. Asthma. Continue Singulair 10 mg at bedtime  10. Tobacco use disorder. Nicotine patch is available.   11. Metabolic syndrome monitoring. Hemoglobin A1c, lipid panel, TSH all  completed in Feb 2018.  12. EKG. Normal sinus rhythm, QTc 433.  13. Disposition. He will be discharged back to his group home. He will follow up with CB and continue day program. Will refer for case coordination.    Kristine Linea, MD 09/29/2016, 6:24 PM

## 2016-09-29 NOTE — Progress Notes (Signed)
D: Pt denies SI/HI/AVH, affect is flat. Patient's thought are organized, with some delayed response noted during conversation, able to follow simple commands and receptive on the unit. Pt is pleasant and cooperative. Pt appears less anxious and he is interacting with peers and staff appropriately.  A: Pt was offered support and encouragement. Pt was given scheduled medications. Pt was encouraged to attend groups. Q 15 minute checks were done for safety.  R:Pt attends groups and interacts well with peers and staff. Pt is taking medication. Pt has no complaints.Pt receptive to treatment and safety maintained on unit.

## 2016-09-30 LAB — GLUCOSE, CAPILLARY
GLUCOSE-CAPILLARY: 151 mg/dL — AB (ref 65–99)
Glucose-Capillary: 492 mg/dL — ABNORMAL HIGH (ref 65–99)
Glucose-Capillary: 272 mg/dL — ABNORMAL HIGH (ref 65–99)
Glucose-Capillary: 307 mg/dL — ABNORMAL HIGH (ref 65–99)

## 2016-09-30 MED ORDER — LAMOTRIGINE 25 MG PO TABS
50.0000 mg | ORAL_TABLET | Freq: Every day | ORAL | Status: DC
  Administered 2016-10-01 – 2016-10-02 (×2): 50 mg via ORAL
  Filled 2016-09-30 (×2): qty 2

## 2016-09-30 MED ORDER — INSULIN ASPART 100 UNIT/ML ~~LOC~~ SOLN
6.0000 [IU] | Freq: Three times a day (TID) | SUBCUTANEOUS | Status: DC
  Administered 2016-09-30 – 2016-10-01 (×4): 6 [IU] via SUBCUTANEOUS
  Filled 2016-09-30 (×4): qty 6
  Filled 2016-09-30: qty 0.06

## 2016-09-30 NOTE — Progress Notes (Addendum)
Inpatient Diabetes Program Recommendations  AACE/ADA: New Consensus Statement on Inpatient Glycemic Control (2015)  Target Ranges:  Prepandial:   less than 140 mg/dL      Peak postprandial:   less than 180 mg/dL (1-2 hours)      Critically ill patients:  140 - 180 mg/dL   Results for DEBRA, CALABRETTA (MRN [id number]) as of 09/30/2016 08:30  Ref. Range 09/29/2016 06:51 09/29/2016 11:42 09/29/2016 16:33 09/29/2016 20:09  Glucose-Capillary Latest Ref Range: 65 - 99 mg/dL 127 (H) 517 (H) 001 (H) 282 (H)    Home DM Meds: Lantus 37 units QHS (Patient Not taking)       Novolog 70/30 Mix- 30 units daily (unsure if patient taking)       Metformin 1000 mg BID  Current Insulin Orders: Lantus 41 units QHS           Novolog Moderate Correction Scale/ SSI (0-15 units) TID AC + HS      Metformin 1000 mg BID     MD- Note fasting blood glucose stable, however, patient having elevated post-prandial CBGs.  Eating 100% of meals per documentation.  Please consider starting Novolog Meal Coverage for this patient in-hospital:  Novolog 6 units TID with meals (hold if pt eats <50% of meal)  Can use the Glycemic Control Order set     --Will follow patient during hospitalization--  Ambrose Finland RN, MSN, CDE Diabetes Coordinator Inpatient Glycemic Control Team Team Pager: [phone] (8a-5p)

## 2016-09-30 NOTE — Tx Team (Signed)
Interdisciplinary Treatment and Diagnostic Plan Update  09/30/2016 Time of Session: 1130 Tony Cunningham MRN: [id number]  Principal Diagnosis: Schizoaffective disorder, depressive type (HCC)  Secondary Diagnoses: Principal Problem:   Schizoaffective disorder, depressive type (HCC) Active Problems:   Diabetes (HCC)   HTN (hypertension)   Tobacco use disorder   Dyslipidemia   Asthma   Tardive dyskinesia   Polysubstance abuse   Suicidal ideation   Current Medications:  Current Facility-Administered Medications  Medication Dose Route Frequency Provider Last Rate Last Dose  . acetaminophen (TYLENOL) tablet 650 mg  650 mg Oral Q6H PRN [name], [name], MD   650 mg at 09/29/16 0843  . alum & mag hydroxide-simeth (MAALOX/MYLANTA) 200-200-20 MG/5ML suspension 30 mL  30 mL Oral Q4H PRN [name], [name], MD      . amantadine (SYMMETREL) capsule 100 mg  100 mg Oral BID [name], [name], MD   100 mg at 09/30/16 0854  . gabapentin (NEURONTIN) capsule 200 mg  200 mg Oral TID [name], [name], MD   200 mg at 09/30/16 1143  . hydrALAZINE (APRESOLINE) tablet 25 mg  25 mg Oral Q8H [name], [name], MD   25 mg at 09/30/16 1143  . insulin aspart (novoLOG) injection 0-15 Units  0-15 Units Subcutaneous TID WC [name], MD   15 Units at 09/30/16 1143  . insulin aspart (novoLOG) injection 0-5 Units  0-5 Units Subcutaneous QHS [name], MD   3 Units at 09/29/16 2144  . insulin glargine (LANTUS) injection 41 Units  41 Units Subcutaneous QHS [name], MD   41 Units at 09/29/16 2141  . lamoTRIgine (LAMICTAL) tablet 25 mg  25 mg Oral Daily [name], MD   25 mg at 09/30/16 0854  . lisinopril (PRINIVIL,ZESTRIL) tablet 20 mg  20 mg Oral Daily [name], [name], MD   20 mg at 09/30/16 0854  . lurasidone (LATUDA) tablet 80 mg  80 mg Oral Q supper [name], [name], MD   80 mg at 09/29/16 1809  . magnesium hydroxide (MILK OF MAGNESIA) suspension 30  mL  30 mL Oral Daily PRN [name], [name], MD      . metFORMIN (GLUCOPHAGE) tablet 1,000 mg  1,000 mg Oral BID WC [name], [name], MD   1,000 mg at 09/30/16 0854  . montelukast (SINGULAIR) tablet 10 mg  10 mg Oral QHS [name], [name], MD   10 mg at 09/29/16 2139  . nicotine (NICODERM CQ - dosed in mg/24 hours) patch 21 mg  21 mg Transdermal Daily [name], MD   21 mg at 09/30/16 0854  . simvastatin (ZOCOR) tablet 40 mg  40 mg Oral q1800 [name], [name], MD   40 mg at 09/29/16 1636  . temazepam (RESTORIL) capsule 30 mg  30 mg Oral QHS [name], [name], MD   30 mg at 09/29/16 2140  . venlafaxine XR (EFFEXOR-XR) 24 hr capsule 150 mg  150 mg Oral Q breakfast [name], [name], MD   150 mg at 09/30/16 0854   PTA Medications: Prescriptions Prior to Admission  Medication Sig Dispense Refill Last Dose  . amantadine (SYMMETREL) 100 MG capsule Take 1 capsule (100 mg total) by mouth 2 (two) times daily. (Patient not taking: Reported on 09/23/2016) 60 capsule 0 Not Taking at Unknown time  . benztropine (COGENTIN) 0.5 MG tablet Take 0.5 mg by mouth daily.   09/23/2016 at 0800  . insulin glargine (LANTUS) 100 UNIT/ML injection Inject 0.37 mLs (37 Units total) into the  skin at bedtime. (Patient not taking: Reported on 09/23/2016) 11 mL 0 Not Taking at Unknown time  . insulin NPH-regular Human (NOVOLIN 70/30) (70-30) 100 UNIT/ML injection Inject 30 Units into the skin daily with breakfast.   09/23/2016 at 0800  . lisinopril-hydrochlorothiazide (PRINZIDE,ZESTORETIC) 20-25 MG tablet Take 1 tablet by mouth daily. For high blood pressure 30 tablet 0 09/23/2016 at 0800  . meloxicam (MOBIC) 7.5 MG tablet Take 7.5 mg by mouth daily.   09/23/2016 at 0800  . metFORMIN (GLUCOPHAGE) 1000 MG tablet Take 1 tablet (1,000 mg total) by mouth 2 (two) times daily with a meal. 60 tablet 0 09/23/2016 at 0800  . montelukast (SINGULAIR) 10 MG tablet Take 1 tablet (10 mg total) by mouth at bedtime. For Asthma 30 tablet 0  09/22/2016 at 2000  . OLANZapine (ZYPREXA) 20 MG tablet Take 1 tablet (20 mg total) by mouth at bedtime. (Patient not taking: Reported on 09/23/2016) 30 tablet 0 Not Taking at Unknown time  . risperiDONE (RISPERDAL) 2 MG tablet Take 2 mg by mouth 2 (two) times daily.   09/23/2016 at 0800  . simvastatin (ZOCOR) 40 MG tablet Take 1 tablet (40 mg total) by mouth at bedtime. For high cholesterol 15 tablet 0 09/22/2016 at 2000  . traZODone (DESYREL) 100 MG tablet Take 1 tablet (100 mg total) by mouth at bedtime. (Patient taking differently: Take 150 mg by mouth at bedtime. ) 30 tablet 0 09/22/2016 at 2000  . venlafaxine XR (EFFEXOR-XR) 150 MG 24 hr capsule Take 2 capsules (300 mg total) by mouth daily with breakfast. 60 capsule 0 07/24/2016 at Unknown time    Patient Stressors: Financial difficulties Medication change or noncompliance  Patient Strengths: Motivation for treatment/growth  Treatment Modalities: Medication Management, Group therapy, Case management,  1 to 1 session with clinician, Psychoeducation, Recreational therapy.   Physician Treatment Plan for Primary Diagnosis: Schizoaffective disorder, depressive type (HCC) Long Term Goal(s): Improvement in symptoms so as ready for discharge Improvement in symptoms so as ready for discharge   Short Term Goals: Ability to verbalize feelings will improve Ability to demonstrate self-control will improve Ability to identify and develop effective coping behaviors will improve Ability to identify and develop effective coping behaviors will improve Ability to identify triggers associated with substance abuse/mental health issues will improve  Medication Management: Evaluate patient's response, side effects, and tolerance of medication regimen.  Therapeutic Interventions: 1 to 1 sessions, Unit Group sessions and Medication administration.  Evaluation of Outcomes: Progressing  Physician Treatment Plan for Secondary Diagnosis: Principal Problem:    Schizoaffective disorder, depressive type (HCC) Active Problems:   Diabetes (HCC)   HTN (hypertension)   Tobacco use disorder   Dyslipidemia   Asthma   Tardive dyskinesia   Polysubstance abuse   Suicidal ideation  Long Term Goal(s): Improvement in symptoms so as ready for discharge Improvement in symptoms so as ready for discharge   Short Term Goals: Ability to verbalize feelings will improve Ability to demonstrate self-control will improve Ability to identify and develop effective coping behaviors will improve Ability to identify and develop effective coping behaviors will improve Ability to identify triggers associated with substance abuse/mental health issues will improve     Medication Management: Evaluate patient's response, side effects, and tolerance of medication regimen.  Therapeutic Interventions: 1 to 1 sessions, Unit Group sessions and Medication administration.  Evaluation of Outcomes: Progressing   RN Treatment Plan for Primary Diagnosis: Schizoaffective disorder, depressive type (HCC) Long Term Goal(s): Knowledge of disease and therapeutic regimen  to maintain health will improve  Short Term Goals: Ability to identify and develop effective coping behaviors will improve and Compliance with prescribed medications will improve  Medication Management: RN will administer medications as ordered by provider, will assess and evaluate patient's response and provide education to patient for prescribed medication. RN will report any adverse and/or side effects to prescribing provider.  Therapeutic Interventions: 1 on 1 counseling sessions, Psychoeducation, Medication administration, Evaluate responses to treatment, Monitor vital signs and CBGs as ordered, Perform/monitor CIWA, COWS, AIMS and Fall Risk screenings as ordered, Perform wound care treatments as ordered.  Evaluation of Outcomes: Progressing   LCSW Treatment Plan for Primary Diagnosis: Schizoaffective disorder,  depressive type (HCC) Long Term Goal(s): Safe transition to appropriate next level of care at discharge, Engage patient in therapeutic group addressing interpersonal concerns.  Short Term Goals: Engage patient in aftercare planning with referrals and resources and Increase skills for wellness and recovery  Therapeutic Interventions: Assess for all discharge needs, 1 to 1 time with Social worker, Explore available resources and support systems, Assess for adequacy in community support network, Educate family and significant other(s) on suicide prevention, Complete Psychosocial Assessment, Interpersonal group therapy.  Evaluation of Outcomes: Progressing    Recreational Therapy Treatment Plan for Primary Diagnosis: Schizoaffective disorder, depressive type (HCC) Long Term Goal(s): Patient will participate in recreation therapy treatment in at least 2 group sessions without prompting from LRT  Short Term Goals: Increase self-esteem  Treatment Modalities: Group Therapy and Individual Treatment Sessions  Therapeutic Interventions: Psychoeducation  Evaluation of Outcomes: Progressing   Progress in Treatment: Attending groups: Yes Participating in groups: Yes Taking medication as prescribed: Yes. Toleration medication: Yes. Family/Significant other contact made: No.  Attempts made Patient understands diagnosis: Yes. Discussing patient identified problems/goals with staff: Yes. Medical problems stabilized or resolved: Yes. Denies suicidal/homicidal ideation: Yes. Issues/concerns per patient self-inventory: No. Other: none  New problem(s) identified: No, Describe:  none  New Short Term/Long Term Goal(s): Pt goal: "I can't think straight."  Discharge Plan or Barriers: Pt will return to outpatient care.  Reason for Continuation of Hospitalization: Hallucinations Medication stabilization  Estimated Length of Stay: 5 days.  Attendees: Patient: 09/30/2016   Physician: Dr. Jennet Maduro,  MD 09/30/2016   Nursing: Leonia Reader, RN 09/30/2016   RN Care Manager: 09/30/2016   Social Worker: Daleen Squibb, LCSW 09/30/2016   Recreational Therapist: Hershal Coria, LRT/CTRS  09/30/2016   Other:  09/30/2016   Other:  09/30/2016   Other: 09/30/2016           Scribe for Treatment Team: [name], LCSW 09/30/2016 1:11 PM

## 2016-09-30 NOTE — [facility] Group Notes (Signed)
[facility] Group Notes:  (Nursing/MHT/Case Management/Adjunct)  Date:  09/30/2016  Time:  4:07 AM  Type of Therapy:  Psychoeducational Skills  Participation Level:  Active  Participation Quality:  Appropriate  Affect:  Appropriate  Cognitive:  Appropriate  Insight:  Appropriate and Good  Engagement in Group:  Engaged  Modes of Intervention:  Discussion, Socialization and Support  Summary of Progress/Problems:  Tony Cunningham 09/30/2016, 4:07 AM

## 2016-09-30 NOTE — Progress Notes (Signed)
Recreation Therapy Notes  Date: 05.09.18 Time: 9:30 am Location: Craft Room  Group Topic: Self-esteem  Goal Area(s) Addresses:  Patient will write at least one positive trait about self. Patient will verbalize benefit of having a healthy self-esteem.  Behavioral Response: Attentive  Intervention: I Am  Activity: Patients were given a worksheet with the letter I on it and were instructed to write as many positive traits about themselves inside the letter.  Education: LRT educated patients on ways to increase their self-esteem.  Education Outcome: In group clarification offered  Clinical Observations/Feedback: Patient wrote positive traits about self. Patient did not contribute to group discussion.  Jacquelynn Cree, LRT/CTRS 09/30/2016 10:14 AM

## 2016-09-30 NOTE — [facility] Group Notes (Signed)
[facility] LCSW Group Therapy   09/30/2016  1:00 pm   Type of Therapy: Group Therapy   Participation Level: Patient invited but did not attend.   Hampton Abbot, MSW, LCSWA 09/30/2016, 1:39PM

## 2016-09-30 NOTE — Plan of Care (Signed)
Problem: Coping: Goal: Ability to cope will improve Outcome: Not Progressing Verbalizes that he feels like he going to loose.  Unable to utilize any coping skills, requesting to have medications.  States "Can you help me"

## 2016-09-30 NOTE — Progress Notes (Signed)
Patient affect remains tense. Glaring at staff and peers bizarrely. Thoughts process seems to be somewhat paranoid and anxious. He continues to have delayed response times. Support offered.  Safety checks maintained.

## 2016-09-30 NOTE — Progress Notes (Signed)
[facility] MD Progress Note  09/30/2016 4:54 PM Jerl Munyan  MRN:  [id number]  Subjective:  41 year old single male with history of schizoaffective disorder depressed type patient came voluntarily to our emergency department in the company of mobile crisis unit. He was complaining of hearing voices which were commanding him to harm himself. Patient reported that he was thinking about killing himself by running into traffic or drowning himself.  5/4. Mr Denise appears anxious today. States he did not sleep well and is feeling very nervous. Experiencing shaking and hand tremors. Denies suicidality, homicidality, auditory or visual hallucinations.   Patient seen today is Saturday the fifth. Today he is complaining of severe neck pain. Also feeling tremulous all over. He was given Benadryl orally earlier in the day without any clear benefit. Continues to complain of pain. When I saw him in his room he is also complaining of just generally feeling terrible. Says he is still feeling confused and angry and mixed up. Still has hallucinations. At the same time the neck pain and the tremulousness are driving him crazy. Patient has not been violent or threatening on the unit. Generally appears to be cooperative. Just started Latuda last night which was a replacement for Geodon which replaced his previous Zyprexa  Patient seen today, Sunday the sixth. Patient this morning is complaining of the same things he complained of yesterday. He looks very agitated and wound up. Tremulous. Complains that he feels like he is going crazy and going to lose his mind. Talks about possibly feeling like he will explode. Not specifically threatening but looks very edgy. Nursing is aware of this as well. Patient has not taken any antipsychotics since yesterday and did receive resumption of his amantadine as well as Benadryl doses. I am no longer convinced that his shakiness is related to his antipsychotic medicine. Patient did have elevated  blood glucose probably from his Zyprexa. Not clear what medicines he will tolerate the best.  09/28/2016. Mr. Wendle is in his room flat on his belly most of the daay. When approached, he complains of severe anxiety and is asking for injection of Ativan. This was given over the weekend with improvement. I will not offer benzodiazepines or painkillers for neck pain. He complains of feeling depressed, restless and anxious. He denies auditory hallucinations. Sleep and appetite are fair.  09/29/2016. Mr. Rohail remains irritable, suspicious, edgy. He made odd comments to diabetes nurse coordinator and was unable to discuss high HgbA1C today. He keeps mostly to his room and hardly interacts with peers or staff. He was aking for Ativan injections today again. I do not see any tremors, stiffness or restlessness. He agreed to increase Latuda. He is not open to a mood stabilizer. Complains of poor sleep. There are no somatic complaints.  09/30/2016. Mr. Malakie seems much better today. He is out of his room and I was able to interview him in the office for the forst time. He is more relaxed and able to participate in conversation. He started going to groups. Sleep was still interrupted. He no longer complains of tremors or neck pain. Hid BG was rather high today. He admits to having snacks. I will add Novolog per [facility] recommendation. This morning the patient had an episode of crying for help "to feel better". It resolved without pharmacological intervention. The patient forgot about it by the time we talked.   Per Nurse:  D: Pt at the time of assessment was flat, isolative and withdrawn to self even when in  the dayroom. Pt at the time endorsed moderate anxiety and some mild neck pain; states, "my neck pain is about a 3 now; anxiety is about a 6." Pt however denied SI, HI or AVH. Pt could be slow to respond to question-as if to be thinking about the best answer to give. Pt remained calm and cooperative. A: Medications  offered as prescribed. Pt educated on non-pharmacological methods in coping with anxiety. All patient's questions and concerns addressed. Support, encouragement, and safe environment provided. Will continue to monitor for any changes. 15-minute safety checks continue. R: Pt was med compliant. Pt attended wrap-up group. Safety checks continue.  Principal Problem: Schizoaffective disorder, depressive type (HCC) Diagnosis:   Patient Active Problem List   Diagnosis Date Noted  . Suicidal ideation [R45.851] 09/23/2016  . Schizoaffective disorder, depressive type (HCC) [F25.1] 09/23/2016  . Polysubstance abuse [F19.10] 07/17/2016  . Tardive dyskinesia [G24.01] 07/16/2016  . Tobacco use disorder [F17.200] 07/07/2016  . Dyslipidemia [E78.5] 07/07/2016  . Asthma [J45.909] 07/07/2016  . HTN (hypertension) [I10] 07/06/2016  . Diabetes (HCC) [E11.9] 12/25/2010   Total Time spent with patient: 30 minutes  Past Psychiatric History: Patient has a long history of mental illness and has had multiple hospitalizations including multiple hospitalizations in [place]. Most of those records are unavailable to me. 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 Not sure whether he ever returns to an adequate baseline. He tells me that he has tried to cut himself and kill himself in the past. Denies any history of violence. He can't remember the names of any of his medicines. Outpatient medicines listed on admission included Abilify and Effexor. Also hospitalized at [facility], [place].  Past Medical History:  Past Medical History:  Diagnosis Date  . Anxiety   . Asthma   . Diabetes mellitus   . High blood pressure   . Sinus complaint    History reviewed. No pertinent surgical history.   Family History: History reviewed. No pertinent family history.   Family Psychiatric  History: None known   Social History: Living in a group home. Has been there  about 4-6 months he estimates. Says that he does not like it. 11 grade education. Does not have a legal guardian  History  Alcohol Use No     History  Drug Use No    Social History   Social History  . Marital status: Single    Spouse name: [name]  . Number of children: [name]  . Years of education: [name]   Social History Main Topics  . Smoking status: Current Every Day Smoker    Packs/day: 0.50    Types: Cigarettes  . Smokeless tobacco: Never Used  . Alcohol use No  . Drug use: No  . Sexual activity: Not Currently   Other Topics Concern  . None   Social History Narrative  . None   Sleep: Poor  Appetite:  Fair  Current Medications: Current Facility-Administered Medications  Medication Dose Route Frequency Provider Last Rate Last Dose  . acetaminophen (TYLENOL) tablet 650 mg  650 mg Oral Q6H PRN [name], [name], MD   650 mg at 09/29/16 0843  . alum & mag hydroxide-simeth (MAALOX/MYLANTA) 200-200-20 MG/5ML suspension 30 mL  30 mL Oral Q4H PRN [name], [name], MD      . amantadine (SYMMETREL) capsule 100 mg  100 mg Oral BID [name], [name], MD   100 mg at 09/30/16 1638  . gabapentin (  NEURONTIN) capsule 200 mg  200 mg Oral TID Gisele Pack B, MD   200 mg at 09/30/16 1638  . hydrALAZINE (APRESOLINE) tablet 25 mg  25 mg Oral Q8H [name], [name], MD   25 mg at 09/30/16 1143  . insulin aspart (novoLOG) injection 0-15 Units  0-15 Units Subcutaneous TID WC [name], MD   8 Units at 09/30/16 1640  . insulin aspart (novoLOG) injection 0-5 Units  0-5 Units Subcutaneous QHS [name], MD   3 Units at 09/29/16 2144  . insulin aspart (novoLOG) injection 6 Units  6 Units Subcutaneous TID WC Hashir Deleeuw B, MD   6 Units at 09/30/16 1640  . insulin glargine (LANTUS) injection 41 Units  41 Units Subcutaneous QHS [name], MD   41 Units at 09/29/16 2141  . [START ON 10/01/2016] lamoTRIgine (LAMICTAL) tablet 50 mg  50 mg Oral Daily  Kayte Borchard B, MD      . lisinopril (PRINIVIL,ZESTRIL) tablet 20 mg  20 mg Oral Daily [name], [name], MD   20 mg at 09/30/16 0854  . lurasidone (LATUDA) tablet 80 mg  80 mg Oral Q supper Doni Bacha B, MD   80 mg at 09/30/16 1638  . magnesium hydroxide (MILK OF MAGNESIA) suspension 30 mL  30 mL Oral Daily PRN [name], [name], MD      . metFORMIN (GLUCOPHAGE) tablet 1,000 mg  1,000 mg Oral BID WC [name], [name], MD   1,000 mg at 09/30/16 1638  . montelukast (SINGULAIR) tablet 10 mg  10 mg Oral QHS [name], [name], MD   10 mg at 09/29/16 2139  . nicotine (NICODERM CQ - dosed in mg/24 hours) patch 21 mg  21 mg Transdermal Daily [name], MD   21 mg at 09/30/16 0854  . simvastatin (ZOCOR) tablet 40 mg  40 mg Oral q1800 [name], [name], MD   40 mg at 09/30/16 1638  . temazepam (RESTORIL) capsule 30 mg  30 mg Oral QHS Jaze Rodino B, MD   30 mg at 09/29/16 2140  . venlafaxine XR (EFFEXOR-XR) 24 hr capsule 150 mg  150 mg Oral Q breakfast Candiss Galeana B, MD   150 mg at 09/30/16 0854    Lab Results:  Results for orders placed or performed during the hospital encounter of 09/23/16 (from the past 48 hour(s))  Glucose, capillary     Status: Abnormal   Collection Time: 09/28/16  9:44 PM  Result Value Ref Range   Glucose-Capillary 409 (H) 65 - 99 mg/dL  Glucose, capillary     Status: Abnormal   Collection Time: 09/29/16  6:51 AM  Result Value Ref Range   Glucose-Capillary 157 (H) 65 - 99 mg/dL  Glucose, capillary     Status: Abnormal   Collection Time: 09/29/16 11:42 AM  Result Value Ref Range   Glucose-Capillary 323 (H) 65 - 99 mg/dL  Glucose, capillary     Status: Abnormal   Collection Time: 09/29/16  4:33 PM  Result Value Ref Range   Glucose-Capillary 365 (H) 65 - 99 mg/dL  Glucose, capillary     Status: Abnormal   Collection Time: 09/29/16  8:09 PM  Result Value Ref Range   Glucose-Capillary 282 (H) 65 - 99 mg/dL  Glucose, capillary      Status: Abnormal   Collection Time: 09/30/16  6:42 AM  Result Value Ref Range   Glucose-Capillary 151 (H) 65 - 99 mg/dL  Glucose, capillary     Status: Abnormal  Collection Time: 09/30/16 11:36 AM  Result Value Ref Range   Glucose-Capillary 492 (H) 65 - 99 mg/dL   Comment 1 Notify RN   Glucose, capillary     Status: Abnormal   Collection Time: 09/30/16  4:31 PM  Result Value Ref Range   Glucose-Capillary 272 (H) 65 - 99 mg/dL   Comment 1 Notify RN     Blood Alcohol level:  Lab Results  Component Value Date   ETH <5 09/23/2016   ETH <5 07/24/2016    Metabolic Disorder Labs: Lab Results  Component Value Date   HGBA1C 11.2 (H) 09/24/2016   MPG 275 09/24/2016   MPG 321 07/07/2016   Lab Results  Component Value Date   PROLACTIN 24.5 (H) 09/24/2016   PROLACTIN 3.4 (L) 07/07/2016   Lab Results  Component Value Date   CHOL 120 07/07/2016   TRIG 153 (H) 07/07/2016   HDL 33 (L) 07/07/2016   CHOLHDL 3.6 07/07/2016   VLDL 31 07/07/2016   LDLCALC 56 07/07/2016   LDLCALC 99 02/08/2016    Physical Findings: AIMS: Facial and Oral Movements Muscles of Facial Expression: Severe Lips and Perioral Area: Severe Jaw: None, normal Tongue: Moderate,Extremity Movements Upper (arms, wrists, hands, fingers): Mild Lower (legs, knees, ankles, toes): None, normal, Trunk Movements Neck, shoulders, hips: None, normal, Overall Severity Severity of abnormal movements (highest score from questions above): Moderate Incapacitation due to abnormal movements: None, normal Patient's awareness of abnormal movements (rate only patient's report): No Awareness, Dental Status Current problems with teeth and/or dentures?: No Does patient usually wear dentures?: No  CIWA:    COWS:     Musculoskeletal: Strength & Muscle Tone: within normal limits Gait & Station: normal Patient leans: [name]  Psychiatric Specialty Exam: Physical Exam  Nursing note and vitals reviewed. Constitutional: He appears  well-nourished.  Neurological: He displays tremor.  Patient's neck does appear to be stiff. He is able to move it but he is having pain. He has tremors all over. Definitely has cogwheel rigidity in his arms.  Psychiatric: His speech is normal. His mood appears anxious. He is withdrawn. Thought content is paranoid. Cognition and memory are normal. He expresses impulsivity. He expresses no homicidal ideation.    Review of Systems  Musculoskeletal: Positive for neck pain.  Neurological: Positive for tremors.  Psychiatric/Behavioral: The patient is nervous/anxious.     Blood pressure 108/75, pulse 68, temperature 98.3 F (36.8 C), temperature source Oral, resp. rate 18, height 5\' 8"  (1.727 m), weight 80.7 kg (178 lb), SpO2 99 %.Body mass index is 27.06 kg/m.  General Appearance: Disheveled  Eye Contact:  Good  Speech:  Slow  Volume:  Decreased  Mood:  Anxious and Dysphoric  Affect:  Appropriate  Thought Process:  Irrelevant  Orientation:  NA  Thought Content:  Illogical  Suicidal Thoughts:  No  Homicidal Thoughts:  No  Memory:  Immediate;   Fair Recent;   Poor Remote;   Poor  Judgement:  Poor  Insight:  Lacking  Psychomotor Activity:  Normal  Concentration:  Concentration: Fair and Attention Span: Fair  Recall:  Poor  Fund of Knowledge:  Poor  Language:  Fair  Akathisia:  No  Handed:    AIMS (if indicated):     Assets:    ADL's:  Intact  Cognition:  Impaired,  Mild  Sleep:  Number of Hours: 6.5     Treatment Plan Summary:  Mr. Shin is a 41 year old male with a history of schizoaffective disorder admitted for  worsening depression, SI and hallucinations. There is also a history of abusing opiates, Lyrica, Remeron and Flexeril.  1. Suicidal ideation. The patient is able to contract for safety in the hospital.  2. Mood and psychosis. We lowered Effexor XR to 150 mg a day for depression and started Lamictal for mood stabilization. We discontinued olanzapine due to poorly  controlled diabetes and briefly gave Geodon. Geodon was discontinued due to what appeared to be akathisia. Wecontinue Latuda 80 mg and increase Lamictal to 50 mg.    3. EPS. We continued amantadine 100 mg po bid  4. Insomnia. We increased Restoril to 30 mg..   5. History of abusing opiates, Flexeril, Lyrica and mirtazapine. We'll benefit from continuing outpatient substance abuse services. We'll not discharge him with controlled substances.   6. Diabetes. We continued Metformin, increased Lantus to 41 units at bedtime and added Novolog 6 units with meas as recommended by DM coordinator, continue ADA, SSI and blood glucose monitoring.   7. Hypertension. Continue lisinopril and Hydralazine.  8. Dyslipidemia. Continue Zocor.   9. Asthma. Continue Singulair 10 mg at bedtime  10. Tobacco use disorder. Nicotine patch is available.   11. Metabolic syndrome monitoring. Hemoglobin A1c, lipid panel, TSH all completed in Feb 2018.  12. EKG. Normal sinus rhythm, QTc 433.  13. Disposition. He will be discharged back to his group home. He will follow up with CB and continue day program. Will refer for case coordination.    Kristine Linea, MD 09/30/2016, 4:54 PM

## 2016-09-30 NOTE — Progress Notes (Signed)
D: Pt at the time of assessment was flat, isolative and withdrawn to self even when in the dayroom. Pt at the time endorsed moderate anxiety and some mild neck pain; states, "my neck pain is about a 3 now; anxiety is about a 6." Pt however denied SI, HI or AVH. Pt could be slow to respond to question-as if to be thinking about the best answer to give. Pt remained calm and cooperative. A: Medications offered as prescribed. Pt educated on non-pharmacological methods in coping with anxiety. All patient's questions and concerns addressed. Support, encouragement, and safe environment provided. Will continue to monitor for any changes. 15-minute safety checks continue. R: Pt was med compliant. Pt attended wrap-up group. Safety checks continue.

## 2016-09-30 NOTE — Progress Notes (Signed)
Patient affect is tense.  Stares.  Tremors noted to arms and hands.  Continues to have delayed response times.  At one point during the day presented to nurses station in tears stating "I feel like I am going to loose it"  Dr. Jennet Maduro informed.  No further orders received.  Instructed patient to return to room and practice deep breathing.  Patient chose to sit in hall and one to the security offices talked with patient and patient started to calm.  Support offered.  Safety checks maintained.

## 2016-10-01 LAB — GLUCOSE, CAPILLARY
Glucose-Capillary: 79 mg/dL (ref 65–99)
Glucose-Capillary: 241 mg/dL — ABNORMAL HIGH (ref 65–99)
Glucose-Capillary: 190 mg/dL — ABNORMAL HIGH (ref 65–99)
Glucose-Capillary: 95 mg/dL (ref 65–99)

## 2016-10-01 MED ORDER — INSULIN ASPART 100 UNIT/ML ~~LOC~~ SOLN
7.0000 [IU] | Freq: Three times a day (TID) | SUBCUTANEOUS | Status: DC
  Administered 2016-10-02 – 2016-10-03 (×4): 7 [IU] via SUBCUTANEOUS
  Filled 2016-10-01 (×2): qty 7

## 2016-10-01 MED ORDER — INSULIN GLARGINE 100 UNIT/ML ~~LOC~~ SOLN
39.0000 [IU] | Freq: Every day | SUBCUTANEOUS | Status: DC
  Administered 2016-10-01 – 2016-10-02 (×2): 39 [IU] via SUBCUTANEOUS
  Filled 2016-10-01 (×3): qty 0.39

## 2016-10-01 NOTE — [facility] Group Notes (Signed)
[facility] LCSW Group Therapy  10/01/2016 1:40 PM  Type of Therapy:  Group Therapy  Participation Level:  Active  Participation Quality:  Appropriate  Affect:  Anxious and Appropriate  Cognitive:  Lacking  Insight:  Limited  Engagement in Therapy:  Engaged  Modes of Intervention:  Discussion, Education, Problem-solving, Dance movement psychotherapist and Support  Summary of Progress/Problems: Balance in life: Patients will discuss the concept of balance and how it looks and feels to be unbalanced. Pt will identify areas in their life that is unbalanced and ways to become more balanced. They discussed what aspects in their lives has influenced their self care. Patients also discussed self care in the areas of self regulation/control, hygiene/appearance, sleep/relaxation, healthy leisure, healthy eating habits, exercise, inner peace/spirituality, self improvement, sobriety, and health management. They were challenged to identify changes that are needed in order to improve self care.  Baker Moronta G. Garnette Czech MSW, LCSWA 10/01/2016, 1:40 PM

## 2016-10-01 NOTE — Progress Notes (Signed)
Inpatient Diabetes Program Recommendations  AACE/ADA: New Consensus Statement on Inpatient Glycemic Control (2015)  Target Ranges:  Prepandial:   less than 140 mg/dL      Peak postprandial:   less than 180 mg/dL (1-2 hours)      Critically ill patients:  140 - 180 mg/dL   Lab Results  Component Value Date   GLUCAP 241 (H) 10/01/2016   HGBA1C 11.2 (H) 09/24/2016    Review of Glycemic Control  Results for SWAYZE, PRIES (MRN [id number]) as of 10/01/2016 13:30  Ref. Range 09/30/2016 11:36 09/30/2016 16:31 09/30/2016 21:47 10/01/2016 06:49 10/01/2016 11:44  Glucose-Capillary Latest Ref Range: 65 - 99 mg/dL 902 (H) 409 (H) 735 (H) 79 241 (H)    Diabetes history: Type 2 Outpatient Diabetes medications: Lantus 37 units qhs, Metformin 1000mg  bid  Current orders for Inpatient glycemic control: Lantus 41 units qhs, Metformin 1000mg  bid, Novolog 0-15 units tid, Novolog 0-5 units qhs,  Novolog 6 units tid  Inpatient Diabetes Program Recommendations:   Consider decreasing Lantus to 39 units qhs and increase Novolog mealtime insulin to 7 units tid  , RN, , Susette Racer, CDE Diabetes Coordinator Inpatient Diabetes Program  [phone] (Team Pager) [phone] [facility] Office) 10/01/2016 1:35 PM

## 2016-10-01 NOTE — Progress Notes (Signed)
Denies SI/HI/AVH.  Affect flat.  Continues to stare/glare.  Continues to have delayed responses.  When asked how you are doing states "Alright"  Attended recreational therapy group.  Minimal interaction.  Isolates self.  Support and encouragement offered and safety checks maintained.

## 2016-10-01 NOTE — [facility] Group Notes (Signed)
[facility] Group Notes:  (Nursing/MHT/Case Management/Adjunct)  Date:  10/01/2016  Time:  6:06 PM  Type of Therapy:  Psychoeducational Skills  Participation Level:  Active  Participation Quality:  Appropriate, Attentive and Sharing  Affect:  Appropriate  Cognitive:  Alert and Appropriate  Insight:  Appropriate  Engagement in Group:  Engaged  Modes of Intervention:  Discussion, Education and Support  Summary of Progress/Problems:  Lynelle Smoke Maycee Blasco 10/01/2016, 6:06 PM

## 2016-10-01 NOTE — [facility] Group Notes (Signed)
[facility] Group Notes:  (Nursing/MHT/Case Management/Adjunct)  Date:  10/01/2016  Time:  10:18 PM  Type of Therapy:  Group Therapy  Participation Level:  Active  Participation Quality:  Appropriate  Affect:  Appropriate  Cognitive:  Alert  Insight:  Appropriate  Engagement in Group:  Engaged  Modes of Intervention:  Support  Summary of Progress/Problems:  Tony Cunningham 10/01/2016, 10:18 PM

## 2016-10-01 NOTE — Progress Notes (Signed)
[facility] MD Progress Note  10/01/2016 6:32 PM Tony Cunningham  MRN:  [id number]  Subjective:  41 year old single male with history of schizoaffective disorder depressed type patient came voluntarily to our emergency department in the company of mobile crisis unit. He was complaining of hearing voices which were commanding him to harm himself. Patient reported that he was thinking about killing himself by running into traffic or drowning himself.  5/4. [name] appears anxious today. States he did not sleep well and is feeling very nervous. Experiencing shaking and hand tremors. Denies suicidality, homicidality, auditory or visual hallucinations.   Patient seen today is Saturday the fifth. Today he is complaining of severe neck pain. Also feeling tremulous all over. He was given Benadryl orally earlier in the day without any clear benefit. Continues to complain of pain. When I saw him in his room he is also complaining of just generally feeling terrible. Says he is still feeling confused and angry and mixed up. Still has hallucinations. At the same time the neck pain and the tremulousness are driving him crazy. Patient has not been violent or threatening on the unit. Generally appears to be cooperative. Just started Latuda last night which was a replacement for Geodon which replaced his previous Zyprexa  Patient seen today, Sunday the sixth. Patient this morning is complaining of the same things he complained of yesterday. He looks very agitated and wound up. Tremulous. Complains that he feels like he is going crazy and going to lose his mind. Talks about possibly feeling like he will explode. Not specifically threatening but looks very edgy. Nursing is aware of this as well. Patient has not taken any antipsychotics since yesterday and did receive resumption of his amantadine as well as Benadryl doses. I am no longer convinced that his shakiness is related to his antipsychotic medicine. Patient did have elevated  blood glucose probably from his Zyprexa. Not clear what medicines he will tolerate the best.  09/28/2016. [name] is in his room flat on his belly most of the daay. When approached, he complains of severe anxiety and is asking for injection of Ativan. This was given over the weekend with improvement. I will not offer benzodiazepines or painkillers for neck pain. He complains of feeling depressed, restless and anxious. He denies auditory hallucinations. Sleep and appetite are fair.  09/29/2016. [name] remains irritable, suspicious, edgy. He made odd comments to diabetes nurse coordinator and was unable to discuss high HgbA1C today. He keeps mostly to his room and hardly interacts with peers or staff. He was aking for Ativan injections today again. I do not see any tremors, stiffness or restlessness. He agreed to increase Latuda. He is not open to a mood stabilizer. Complains of poor sleep. There are no somatic complaints.  09/30/2016. [name] seems much better today. He is out of his room and I was able to interview him in the office for the forst time. He is more relaxed and able to participate in conversation. He started going to groups. Sleep was still interrupted. He no longer complains of tremors or neck pain. Hid BG was rather high today. He admits to having snacks. I will add Novolog per [facility] recommendation. This morning the patient had an episode of crying for help "to feel better". It resolved without pharmacological intervention. The patient forgot about it by the time we talked.   10/01/2016. [name] was hardly visible on the unit today. He continues to isolate. He is ableto hold a brief  conversation. There is still latency of respnse. He denies hallucinations and claims to feel better. He denies side effects of medications but still feels on the edge.  Per Nurse:  Patient affect remains tense. Glaring at staff and peers bizarrely. Thoughts process seems to be somewhat paranoid and anxious.  He continues to have delayed response times. Support offered. Safety checks maintained.   Principal Problem: Schizoaffective disorder, depressive type (HCC) Diagnosis:   Patient Active Problem List   Diagnosis Date Noted  . Suicidal ideation [R45.851] 09/23/2016  . Schizoaffective disorder, depressive type (HCC) [F25.1] 09/23/2016  . Polysubstance abuse [F19.10] 07/17/2016  . Tardive dyskinesia [G24.01] 07/16/2016  . Tobacco use disorder [F17.200] 07/07/2016  . Dyslipidemia [E78.5] 07/07/2016  . Asthma [J45.909] 07/07/2016  . HTN (hypertension) [I10] 07/06/2016  . Diabetes (HCC) [E11.9] 12/25/2010   Total Time spent with patient: 30 minutes  Past Psychiatric History: Patient has a long history of mental illness and has had multiple hospitalizations including multiple hospitalizations in [place]. Most of those records are unavailable to me. 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 Not sure whether he ever returns to an adequate baseline. He tells me that he has tried to cut himself and kill himself in the past. Denies any history of violence. He can't remember the names of any of his medicines. Outpatient medicines listed on admission included Abilify and Effexor. Also hospitalized at [facility], [place].  Past Medical History:  Past Medical History:  Diagnosis Date  . Anxiety   . Asthma   . Diabetes mellitus   . High blood pressure   . Sinus complaint    History reviewed. No pertinent surgical history.   Family History: History reviewed. No pertinent family history.   Family Psychiatric  History: None known   Social History: Living in a group home. Has been there about 4-6 months he estimates. Says that he does not like it. 11 grade education. Does not have a legal guardian  History  Alcohol Use No     History  Drug Use No    Social History   Social History  . Marital status: Single    Spouse name: [name]   . Number of children: [name]  . Years of education: [name]   Social History Main Topics  . Smoking status: Current Every Day Smoker    Packs/day: 0.50    Types: Cigarettes  . Smokeless tobacco: Never Used  . Alcohol use No  . Drug use: No  . Sexual activity: Not Currently   Other Topics Concern  . None   Social History Narrative  . None   Sleep: Poor  Appetite:  Fair  Current Medications: Current Facility-Administered Medications  Medication Dose Route Frequency Provider Last Rate Last Dose  . acetaminophen (TYLENOL) tablet 650 mg  650 mg Oral Q6H PRN [name], [name], MD   650 mg at 09/29/16 0843  . alum & mag hydroxide-simeth (MAALOX/MYLANTA) 200-200-20 MG/5ML suspension 30 mL  30 mL Oral Q4H PRN [name], [name], MD      . amantadine (SYMMETREL) capsule 100 mg  100 mg Oral BID [name], [name], MD   100 mg at 10/01/16 1655  . gabapentin (NEURONTIN) capsule 200 mg  200 mg Oral TID Isadore Bokhari B, MD   200 mg at 10/01/16 1652  . hydrALAZINE (APRESOLINE) tablet 25 mg  25 mg Oral Q8H [name], [name], MD   25 mg at 10/01/16 1201  .  insulin aspart (novoLOG) injection 0-15 Units  0-15 Units Subcutaneous TID WC [name], MD   3 Units at 10/01/16 1700  . insulin aspart (novoLOG) injection 0-5 Units  0-5 Units Subcutaneous QHS [name], MD   4 Units at 09/30/16 2308  . [START ON 10/02/2016] insulin aspart (novoLOG) injection 7 Units  7 Units Subcutaneous TID WC Benjimen Kelley B, MD      . insulin glargine (LANTUS) injection 39 Units  39 Units Subcutaneous QHS T[place]on Roker B, MD      . lamoTRIgine (LAMICTAL) tablet 50 mg  50 mg Oral Daily Raymar Joiner B, MD   50 mg at 10/01/16 0759  . lisinopril (PRINIVIL,ZESTRIL) tablet 20 mg  20 mg Oral Daily [name], [name], MD   20 mg at 10/01/16 0759  . lurasidone (LATUDA) tablet 80 mg  80 mg Oral Q supper Leinaala Catanese B, MD   80 mg at 10/01/16 1655  . magnesium hydroxide (MILK OF  MAGNESIA) suspension 30 mL  30 mL Oral Daily PRN [name], [name], MD      . metFORMIN (GLUCOPHAGE) tablet 1,000 mg  1,000 mg Oral BID WC [name], [name], MD   1,000 mg at 10/01/16 1652  . montelukast (SINGULAIR) tablet 10 mg  10 mg Oral QHS [name], [name], MD   10 mg at 09/30/16 2143  . nicotine (NICODERM CQ - dosed in mg/24 hours) patch 21 mg  21 mg Transdermal Daily [name], MD   21 mg at 10/01/16 0800  . simvastatin (ZOCOR) tablet 40 mg  40 mg Oral q1800 [name], [name], MD   40 mg at 10/01/16 1800  . temazepam (RESTORIL) capsule 30 mg  30 mg Oral QHS Regan Mcbryar B, MD   30 mg at 09/30/16 2143  . venlafaxine XR (EFFEXOR-XR) 24 hr capsule 150 mg  150 mg Oral Q breakfast Paris Hohn B, MD   150 mg at 10/01/16 0759    Lab Results:  Results for orders placed or performed during the hospital encounter of 09/23/16 (from the past 48 hour(s))  Glucose, capillary     Status: Abnormal   Collection Time: 09/29/16  8:09 PM  Result Value Ref Range   Glucose-Capillary 282 (H) 65 - 99 mg/dL  Glucose, capillary     Status: Abnormal   Collection Time: 09/30/16  6:42 AM  Result Value Ref Range   Glucose-Capillary 151 (H) 65 - 99 mg/dL  Glucose, capillary     Status: Abnormal   Collection Time: 09/30/16 11:36 AM  Result Value Ref Range   Glucose-Capillary 492 (H) 65 - 99 mg/dL   Comment 1 Notify RN   Glucose, capillary     Status: Abnormal   Collection Time: 09/30/16  4:31 PM  Result Value Ref Range   Glucose-Capillary 272 (H) 65 - 99 mg/dL   Comment 1 Notify RN   Glucose, capillary     Status: Abnormal   Collection Time: 09/30/16  9:47 PM  Result Value Ref Range   Glucose-Capillary 307 (H) 65 - 99 mg/dL  Glucose, capillary     Status: None   Collection Time: 10/01/16  6:49 AM  Result Value Ref Range   Glucose-Capillary 79 65 - 99 mg/dL  Glucose, capillary     Status: Abnormal   Collection Time: 10/01/16 11:44 AM  Result Value Ref Range   Glucose-Capillary  241 (H) 65 - 99 mg/dL   Comment 1 Document in Chart   Glucose, capillary  Status: Abnormal   Collection Time: 10/01/16  4:30 PM  Result Value Ref Range   Glucose-Capillary 190 (H) 65 - 99 mg/dL   Comment 1 Notify RN     Blood Alcohol level:  Lab Results  Component Value Date   ETH <5 09/23/2016   ETH <5 07/24/2016    Metabolic Disorder Labs: Lab Results  Component Value Date   HGBA1C 11.2 (H) 09/24/2016   MPG 275 09/24/2016   MPG 321 07/07/2016   Lab Results  Component Value Date   PROLACTIN 24.5 (H) 09/24/2016   PROLACTIN 3.4 (L) 07/07/2016   Lab Results  Component Value Date   CHOL 120 07/07/2016   TRIG 153 (H) 07/07/2016   HDL 33 (L) 07/07/2016   CHOLHDL 3.6 07/07/2016   VLDL 31 07/07/2016   LDLCALC 56 07/07/2016   LDLCALC 99 02/08/2016    Physical Findings: AIMS: Facial and Oral Movements Muscles of Facial Expression: Severe Lips and Perioral Area: Severe Jaw: None, normal Tongue: Moderate,Extremity Movements Upper (arms, wrists, hands, fingers): Mild Lower (legs, knees, ankles, toes): None, normal, Trunk Movements Neck, shoulders, hips: None, normal, Overall Severity Severity of abnormal movements (highest score from questions above): Moderate Incapacitation due to abnormal movements: None, normal Patient's awareness of abnormal movements (rate only patient's report): No Awareness, Dental Status Current problems with teeth and/or dentures?: No Does patient usually wear dentures?: No  CIWA:    COWS:     Musculoskeletal: Strength & Muscle Tone: within normal limits Gait & Station: normal Patient leans: [name]  Psychiatric Specialty Exam: Physical Exam  Nursing note and vitals reviewed. Constitutional: He appears well-nourished.  Neurological: He displays tremor.  Patient's neck does appear to be stiff. He is able to move it but he is having pain. He has tremors all over. Definitely has cogwheel rigidity in his arms.  Psychiatric: His speech is  normal. His mood appears anxious. He is withdrawn. Thought content is paranoid. Cognition and memory are normal. He expresses impulsivity. He expresses no homicidal ideation.    Review of Systems  Musculoskeletal: Positive for neck pain.  Neurological: Positive for tremors.  Psychiatric/Behavioral: The patient is nervous/anxious.     Blood pressure 136/87, pulse 72, temperature 98.4 F (36.9 C), temperature source Oral, resp. rate 18, height 5\' 8"  (1.727 m), weight 80.7 kg (178 lb), SpO2 99 %.Body mass index is 27.06 kg/m.  General Appearance: Disheveled  Eye Contact:  Good  Speech:  Slow  Volume:  Decreased  Mood:  Anxious and Dysphoric  Affect:  Appropriate  Thought Process:  Irrelevant  Orientation:  NA  Thought Content:  Illogical  Suicidal Thoughts:  No  Homicidal Thoughts:  No  Memory:  Immediate;   Fair Recent;   Poor Remote;   Poor  Judgement:  Poor  Insight:  Lacking  Psychomotor Activity:  Normal  Concentration:  Concentration: Fair and Attention Span: Fair  Recall:  Poor  Fund of Knowledge:  Poor  Language:  Fair  Akathisia:  No  Handed:    AIMS (if indicated):     Assets:    ADL's:  Intact  Cognition:  Impaired,  Mild  Sleep:  Number of Hours: 5.25     Treatment Plan Summary:  [name] is a 41 year old male with a history of schizoaffective disorder admitted for worsening depression, SI and hallucinations. There is also a history of abusing opiates, Lyrica, Remeron and Flexeril.  1. Suicidal ideation. The patient is able to contract for safety in the hospital.  2. Mood and psychosis. Zyprexa was discontinued on admission due to poorly controlled diabetes. We briefly gave Geodon but it caused akathisia. We startedLatuda. He currently takes 80 mg. We continued Effexor and Lamicta, now at 50 mg nightly.     3. EPS. We continued amantadine 100 mg po bid  4. Insomnia. We increased Restoril to 30 mg but he still sleeps poorly.   5. History of abusing  opiates, Flexeril, Lyrica and mirtazapine. We'll benefit from continuing outpatient substance abuse services. We'll not discharge him with controlled substances.   6. Diabetes. We continued Metformin and adjusted insulin according to diabetes nurse coordinator suggestions. Today it is Lantus to 39 units at bedtime and Novolog 7 units with meals. He continues on ADA, SSI and blood glucose monitoring. Unfortunately, the patient consumes vast amount og snacks.  7. Hypertension. Continue lisinopril and Hydralazine.  8. Dyslipidemia. Continue Zocor.   9. Asthma. Continue Singulair 10 mg at bedtime  10. Tobacco use disorder. Nicotine patch is available.   11. Metabolic syndrome monitoring. Hemoglobin A1c, lipid panel, TSH all completed in Feb 2018.  12. EKG. Normal sinus rhythm, QTc 433.  13. Disposition. He will be discharged back to his group home. He will follow up with Dr. Mila Palmer at [facility] and continue day program. Case coordination was discussed with the SW.    Kristine Linea, MD 10/01/2016, 6:32 PM

## 2016-10-01 NOTE — [facility] Group Notes (Signed)
[facility] LCSW Group Therapy Note  Type of Therapy and Topic:  Group Therapy:  Goals Group: SMART Goals  Participation Level:  Patient did not attend group. CSW invited patient to group.   Description of Group:   The purpose of a daily goals group is to assist and guide patients in setting recovery/wellness-related goals.  The objective is to set goals as they relate to the crisis in which they were admitted. Patients will be using SMART goal modalities to set measurable goals.  Characteristics of realistic goals will be discussed and patients will be assisted in setting and processing how one will reach their goal. Facilitator will also assist patients in applying interventions and coping skills learned in psycho-education groups to the SMART goal and process how one will achieve defined goal.  Therapeutic Goals: -Patients will develop and document one goal related to or their crisis in which brought them into treatment. -Patients will be guided by LCSW using SMART goal setting modality in how to set a measurable, attainable, realistic and time sensitive goal.  -Patients will process barriers in reaching goal. -Patients will process interventions in how to overcome and successful in reaching goal.   Summary of Patient Progress:  Patient Goal: None identified at this time, patient did not attend group.   Therapeutic Modalities:   Motivational Interviewing Engineer, manufacturing systems Therapy Crisis Intervention Model SMART goals setting  Layonna Dobie G. Garnette Czech MSW, LCSWA 10/01/2016 10:59 AM

## 2016-10-01 NOTE — Progress Notes (Signed)
Recreation Therapy Notes  Date: 05.10.18 Time: 9:30 am Location: Craft Room  Group Topic: Leisure Education  Goal Area(s) Addresses:  Patient will identify things they are grateful for. Patient will identify how being grateful can influence decision making.  Behavioral Response: Attentive, Interactive  Intervention: Grateful Wheel  Activity: Patients were given an I Am Grateful For worksheet and were instructed to write things they were grateful for under each category.  Education: LRT educated patients on leisure.  Education Outcome: In group clarification offered  Clinical Observations/Feedback: Patient wrote things he was grateful for. Patient contributed to group discussion by stating things he is grateful for. Patient asked if he would be in the hospital forever. LRT informed patient he would not be in the hospital forever. At the end of group, patient asked if he would be okay. LRT told patient he would be fine.  Jacquelynn Cree, LRT/CTRS 10/01/2016 10:14 AM

## 2016-10-02 DIAGNOSIS — F251 Schizoaffective disorder, depressive type: Principal | ICD-10-CM

## 2016-10-02 LAB — GLUCOSE, CAPILLARY
GLUCOSE-CAPILLARY: 241 mg/dL — AB (ref 65–99)
GLUCOSE-CAPILLARY: 257 mg/dL — AB (ref 65–99)
GLUCOSE-CAPILLARY: 167 mg/dL — AB (ref 65–99)
Glucose-Capillary: 161 mg/dL — ABNORMAL HIGH (ref 65–99)

## 2016-10-02 MED ORDER — LURASIDONE HCL 80 MG PO TABS: 80.0000 mg | ORAL_TABLET | Freq: Every day | ORAL | 0 refills | Status: DC

## 2016-10-02 MED ORDER — AMANTADINE HCL 100 MG PO CAPS: 100.0000 mg | ORAL_CAPSULE | Freq: Two times a day (BID) | ORAL | 0 refills | Status: DC

## 2016-10-02 MED ORDER — LISINOPRIL 20 MG PO TABS: 20.0000 mg | ORAL_TABLET | Freq: Every day | ORAL | 0 refills | Status: DC

## 2016-10-02 MED ORDER — INSULIN GLARGINE 100 UNIT/ML ~~LOC~~ SOLN: 39.0000 [IU] | Freq: Every day | SUBCUTANEOUS | 0 refills | Status: DC

## 2016-10-02 MED ORDER — DIAZEPAM 10 MG PO TABS: 10.0000 mg | ORAL_TABLET | Freq: Every day | ORAL | 0 refills | Status: DC

## 2016-10-02 MED ORDER — GABAPENTIN 300 MG PO CAPS
300.0000 mg | ORAL_CAPSULE | Freq: Three times a day (TID) | ORAL | Status: DC
  Administered 2016-10-02 – 2016-10-03 (×4): 300 mg via ORAL
  Filled 2016-10-02 (×4): qty 1

## 2016-10-02 MED ORDER — HYDRALAZINE HCL 25 MG PO TABS: 25.0000 mg | ORAL_TABLET | Freq: Three times a day (TID) | ORAL | 0 refills | Status: DC

## 2016-10-02 MED ORDER — VENLAFAXINE HCL ER 150 MG PO CP24: 150.0000 mg | ORAL_CAPSULE | Freq: Every day | ORAL | 0 refills | Status: DC

## 2016-10-02 MED ORDER — INSULIN ASPART 100 UNIT/ML ~~LOC~~ SOLN: 7.0000 [IU] | Freq: Three times a day (TID) | SUBCUTANEOUS | 0 refills | Status: DC

## 2016-10-02 MED ORDER — GABAPENTIN 300 MG PO CAPS: 300.0000 mg | ORAL_CAPSULE | Freq: Three times a day (TID) | ORAL | 0 refills | Status: DC

## 2016-10-02 NOTE — Progress Notes (Signed)
D: Pt at the time of assessment was flat, isolative and withdrawn to room; remained in bed through the evening. Pt at the time endorsed moderate anxiety and depression; states, "I am still feeling depressed and anxious today." Pt denied pain, SI, HI or AVH. Pt could be slow to respond to questions. Pt remained calm and cooperative. A: Medications offered as prescribed. All patient's questions and concerns addressed. Support, encouragement, and safe environment provided. Will continue to monitor for any changes. 15-minute safety checks continue. R: Pt was med compliant. Pt attended wrap-up group. Safety checks continue.

## 2016-10-02 NOTE — Plan of Care (Signed)
Problem: [facility] Participation in Recreation Therapeutic Interventions Goal: STG-Patient will demonstrate improved self esteem by identif STG: Self-Esteem - Within 4 treatment sessions, patient will verbalize at least 5 positive affirmation statements in each of 2 treatment sessions to increase self-esteem.  Outcome: Completed/Met Date Met: 10/02/16 Treatment Session 2; Completed 2 out of 2: At approximately 11:35 am, LRT met with patient in patient room. Patient verbalized 5 positive affirmation statements. Patient reported it felt "good". LRT encouraged patient to continue saying positive affirmation statements.  Leonette Monarch, LRT/CTRS 05.11.18 11:51 am

## 2016-10-02 NOTE — [facility] Group Notes (Signed)
[facility] Group Notes:  (Nursing/MHT/Case Management/Adjunct)  Date:  10/02/2016  Time:  4:28 PM  Type of Therapy:  Psychoeducational Skills  Participation Level:  Did Not Attend  Tony Cunningham 10/02/2016, 4:28 PM

## 2016-10-02 NOTE — [facility] Suicide Risk Assessment (Addendum)
[facility] Discharge Suicide Risk Assessment   Principal Problem: Schizoaffective disorder, depressive type [facility]) Discharge Diagnoses:  Patient Active Problem List   Diagnosis Date Noted  . Suicidal ideation [R45.851] 09/23/2016  . Schizoaffective disorder, depressive type (HCC) [F25.1] 09/23/2016  . Polysubstance abuse [F19.10] 07/17/2016  . Tardive dyskinesia [G24.01] 07/16/2016  . Tobacco use disorder [F17.200] 07/07/2016  . Dyslipidemia [E78.5] 07/07/2016  . Asthma [J45.909] 07/07/2016  . HTN (hypertension) [I10] 07/06/2016  . Diabetes (HCC) [E11.9] 12/25/2010    Psychiatric Specialty Exam: ROS  Blood pressure 117/82, pulse 62, temperature 98.5 F (36.9 C), temperature source Oral, resp. rate 18, height 5\' 8"  (1.727 m), weight 80.7 kg (178 lb), SpO2 99 %.Body mass index is 27.06 kg/m.                                                       Mental Status Per Nursing Assessment::   On Admission:  Suicide plan, Self-harm thoughts, Intention to act on suicide plan  Demographic Factors:  Male  Loss Factors: Decrease in vocational status  Historical Factors: Prior suicide attempts and Impulsivity  Risk Reduction Factors:   Living with another person, especially a relative and Positive social support  No access to guns  Continued Clinical Symptoms:  More than one psychiatric diagnosis Previous Psychiatric Diagnoses and Treatments  Cognitive Features That Contribute To Risk:  Closed-mindedness and Loss of executive function    Suicide Risk:  Minimal: No identifiable suicidal ideation.  Patients presenting with no risk factors but with morbid ruminations; may be classified as minimal risk based on the severity of the depressive symptoms  Follow-up Information    [facility], [phone] Behavioral. Go on 10/07/2016.   Why:  Please attend your follow up appointment at [facility] with Dr. [facility] on 10/07/16 at 10:20am.  Please bring a copy of your  hospital discharge paperwork. Contact information: [address] [place] [phone]             [phone], MD 10/02/2016, 11:55 AM

## 2016-10-02 NOTE — Progress Notes (Signed)
  [facility] Adult Case Management Discharge Plan :  Will you be returning to the same living situation after discharge:  Yes,  group home At discharge, do you have transportation home?: Yes,  group home Do you have the ability to pay for your medications: Yes,  medicare  Release of information consent forms completed and in the chart;  Patient's signature needed at discharge.  Patient to Follow up at: Follow-up Information    [facility], [place]. Go on 10/07/2016.   Why:  Please attend your follow up appointment at [facility] with Dr. Thana Ates on 10/07/16 at 10:20am.  Please bring a copy of your hospital discharge paperwork. Contact information: [address] [place] [phone]           Next level of care provider has access to [facility] Link:no  Safety Planning and Suicide Prevention discussed: No. Attempts made  Have you used any form of tobacco in the last 30 days? (Cigarettes, Smokeless Tobacco, Cigars, and/or Pipes): Yes  Has patient been referred to the Quitline?: Patient refused referral  Patient has been referred for addiction treatment: Yes  [name], LCSW 10/02/2016, 1:05 PM

## 2016-10-02 NOTE — Progress Notes (Signed)
First am patient states that he feels better although continues to have intense look.  Later in morning presented to this writer crying stating that his neck hurt so bad that he was going to loose it.  Patient was standing with fists balled up and arms tense/rigid.  Instructed the patient to return to his room and lay down.  Informed Dr. Ardyth Harps during treatment team.  Dr. Ardyth Harps stated that patient said he felt better and had requested to go home.  Remainder of shift patient noted in halls.  No further complaints.  Support offered.  Safety maintained.

## 2016-10-02 NOTE — Progress Notes (Signed)
Recreation Therapy Notes  Date: 05.11.18 Time: 9:30 am Location: Craft Room  Group Topic: Coping Skills  Goal Area(s) Addresses:  Patient will participate in healthy coping skill. Patient will verbalize what makes art a good coping skill.  Behavioral Response: Did not attend  Intervention: Coloring  Activity: Patients were given coloring sheets to color and were instructed to think of what emotions they were feeling as well as what their minds were focused on.  Education: LRT educated patients on healthy coping skills.  Education Outcome: Patient did not attend group.  Clinical Observations/Feedback: Patient did not attend group.  Jacquelynn Cree, LRT/CTRS 10/02/2016 10:19 AM
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
## 2016-10-02 NOTE — [facility] Group Notes (Signed)
[facility] LCSW Group Therapy  10/02/2016 1:29 PM  Type of Therapy:  Group Therapy  Participation Level:  Patient did not attend group. CSW invited patient to group.   Summary of Progress/Problems: Stress management: Patients defined and discussed the topic of stress and the related symptoms and triggers for stress. Patients identified healthy coping skills they would like to try during hospitalization and after discharge to manage stress in a healthy way. CSW offered insight to varying stress management techniques.   Charly Holcomb G. Garnette Czech MSW, LCSWA 10/02/2016, 1:29 PM

## 2016-10-02 NOTE — Progress Notes (Addendum)
[facility] MD Progress Note  10/02/2016 12:35 PM Tony Cunningham  MRN:  [id number]  Subjective:  41 year old single male with history of schizoaffective disorder depressed type patient came voluntarily to our emergency department in the company of mobile crisis unit. He was complaining of hearing voices which were commanding him to harm himself. Patient reported that he was thinking about killing himself by running into traffic or drowning himself.  5/4. Tony Cunningham appears anxious today. States he did not sleep well and is feeling very nervous. Experiencing shaking and hand tremors. Denies suicidality, homicidality, auditory or visual hallucinations.   Patient seen today is Saturday the fifth. Today he is complaining of severe neck pain. Also feeling tremulous all over. He was given Benadryl orally earlier in the day without any clear benefit. Continues to complain of pain. When I saw him in his room he is also complaining of just generally feeling terrible. Says he is still feeling confused and angry and mixed up. Still has hallucinations. At the same time the neck pain and the tremulousness are driving him crazy. Patient has not been violent or threatening on the unit. Generally appears to be cooperative. Just started Latuda last night which was a replacement for Geodon which replaced his previous Zyprexa  Patient seen today, Sunday the sixth. Patient this morning is complaining of the same things he complained of yesterday. He looks very agitated and wound up. Tremulous. Complains that he feels like he is going crazy and going to lose his mind. Talks about possibly feeling like he will explode. Not specifically threatening but looks very edgy. Nursing is aware of this as well. Patient has not taken any antipsychotics since yesterday and did receive resumption of his amantadine as well as Benadryl doses. I am no longer convinced that his shakiness is related to his antipsychotic medicine. Patient did have elevated  blood glucose probably from his Zyprexa. Not clear what medicines he will tolerate the best.  09/28/2016. Tony Cunningham is in his room flat on his belly most of the daay. When approached, he complains of severe anxiety and is asking for injection of Ativan. This was given over the weekend with improvement. I will not offer benzodiazepines or painkillers for neck pain. He complains of feeling depressed, restless and anxious. He denies auditory hallucinations. Sleep and appetite are fair.  09/29/2016. Tony Cunningham remains irritable, suspicious, edgy. He made odd comments to diabetes nurse coordinator and was unable to discuss high HgbA1C today. He keeps mostly to his room and hardly interacts with peers or staff. He was aking for Ativan injections today again. I do not see any tremors, stiffness or restlessness. He agreed to increase Latuda. He is not open to a mood stabilizer. Complains of poor sleep. There are no somatic complaints.  09/30/2016. Tony Cunningham seems much better today. He is out of his room and I was able to interview him in the office for the forst time. He is more relaxed and able to participate in conversation. He started going to groups. Sleep was still interrupted. He no longer complains of tremors or neck pain. Hid BG was rather high today. He admits to having snacks. I will add Novolog per [facility] recommendation. This morning the patient had an episode of crying for help "to feel better". It resolved without pharmacological intervention. The patient forgot about it by the time we talked.   10/01/2016. Tony Cunningham was hardly visible on the unit today. He continues to isolate. He is ableto hold a brief  conversation. There is still latency of respnse. He denies hallucinations and claims to feel better. He denies side effects of medications but still feels on the edge.  5/11 patient reports he is actually feeling better. He says that he is not longer having thoughts of suicide or hallucinations. He was  actually smiling what we were talking. He denies having side effects from medications. Says that he has been is sleeping and eating better. As far as physical complaints he complains of neck pain which he has been complaining about for days. Earlier in the day he told there nurse that he was going to "lose it" because he didn't feel well due to the neck pain  Per Nurse:  [name] Gammon that he feels like he going to loose.  Unable to utilize any coping skills, requesting to have medications.  States "Can you help me"  Pt attended group today and stayed out of bed. Voiced his goal was to stay out of bed "to feel better." Improvement noted.    Principal Problem: Schizoaffective disorder, depressive type (HCC) Diagnosis:   Patient Active Problem List   Diagnosis Date Noted  . Suicidal ideation [R45.851] 09/23/2016  . Schizoaffective disorder, depressive type (HCC) [F25.1] 09/23/2016  . Polysubstance abuse [F19.10] 07/17/2016  . Tardive dyskinesia [G24.01] 07/16/2016  . Tobacco use disorder [F17.200] 07/07/2016  . Dyslipidemia [E78.5] 07/07/2016  . Asthma [J45.909] 07/07/2016  . HTN (hypertension) [I10] 07/06/2016  . Diabetes (HCC) [E11.9] 12/25/2010   Total Time spent with patient: 30 minutes  Past Psychiatric History: Patient has a long history of mental illness and has had multiple hospitalizations including multiple hospitalizations in [place]. Most of those records are unavailable to me. 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 Not sure whether he ever returns to an adequate baseline. He tells me that he has tried to cut himself and kill himself in the past. Denies any history of violence. He can't remember the names of any of his medicines. Outpatient medicines listed on admission included Abilify and Effexor. Also hospitalized at [facility], [place].  Past Medical History:  Past Medical History:  Diagnosis Date  . Anxiety    . Asthma   . Diabetes mellitus   . High blood pressure   . Sinus complaint    History reviewed. No pertinent surgical history.   Family History: History reviewed. No pertinent family history.   Family Psychiatric  History: None known   Social History: Living in a group home. Has been there about 4-6 months he estimates. Says that he does not like it. 11 grade education. Does not have a legal guardian  History  Alcohol Use No     History  Drug Use No    Social History   Social History  . Marital status: Single    Spouse name: [name]  . Number of children: [name]  . Years of education: [name]   Social History Main Topics  . Smoking status: Current Every Day Smoker    Packs/day: 0.50    Types: Cigarettes  . Smokeless tobacco: Never Used  . Alcohol use No  . Drug use: No  . Sexual activity: Not Currently   Other Topics Concern  . None   Social History Narrative  . None    Current Medications: Current Facility-Administered Medications  Medication Dose Route Frequency Provider Last Rate Last Dose  . acetaminophen (TYLENOL) tablet 650 mg  650 mg Oral Q6H PRN [name], [name],  MD   650 mg at 10/02/16 0648  . alum & mag hydroxide-simeth (MAALOX/MYLANTA) 200-200-20 MG/5ML suspension 30 mL  30 mL Oral Q4H PRN [name], [name], MD      . amantadine (SYMMETREL) capsule 100 mg  100 mg Oral BID [name], [name], MD   100 mg at 10/02/16 0857  . gabapentin (NEURONTIN) capsule 300 mg  300 mg Oral TID [name], MD   300 mg at 10/02/16 1206  . hydrALAZINE (APRESOLINE) tablet 25 mg  25 mg Oral Q8H [name], [name], MD   25 mg at 10/02/16 1206  . insulin aspart (novoLOG) injection 0-15 Units  0-15 Units Subcutaneous TID WC [name], MD   3 Units at 10/02/16 1208  . insulin aspart (novoLOG) injection 0-5 Units  0-5 Units Subcutaneous QHS [name], MD   4 Units at 09/30/16 2308  . insulin aspart (novoLOG) injection 7 Units  7 Units Subcutaneous  TID WC [name], [name], MD   7 Units at 10/02/16 1209  . insulin glargine (LANTUS) injection 39 Units  39 Units Subcutaneous QHS [name], [name], MD   39 Units at 10/01/16 2200  . lisinopril (PRINIVIL,ZESTRIL) tablet 20 mg  20 mg Oral Daily [name], [name], MD   20 mg at 10/02/16 0857  . lurasidone (LATUDA) tablet 80 mg  80 mg Oral Q supper [name], [name], MD   80 mg at 10/01/16 1655  . magnesium hydroxide (MILK OF MAGNESIA) suspension 30 mL  30 mL Oral Daily PRN [name], [name], MD      . metFORMIN (GLUCOPHAGE) tablet 1,000 mg  1,000 mg Oral BID WC [name], [name], MD   1,000 mg at 10/02/16 0857  . montelukast (SINGULAIR) tablet 10 mg  10 mg Oral QHS [name], [name], MD   10 mg at 10/01/16 2158  . nicotine (NICODERM CQ - dosed in mg/24 hours) patch 21 mg  21 mg Transdermal Daily [name], MD   21 mg at 10/02/16 0857  . simvastatin (ZOCOR) tablet 40 mg  40 mg Oral q1800 [name], [name], MD   40 mg at 10/01/16 1800  . temazepam (RESTORIL) capsule 30 mg  30 mg Oral QHS [name], [name], MD   30 mg at 10/01/16 2158  . venlafaxine XR (EFFEXOR-XR) 24 hr capsule 150 mg  150 mg Oral Q breakfast [name], [name], MD   150 mg at 10/02/16 0857    Lab Results:  Results for orders placed or performed during the hospital encounter of 09/23/16 (from the past 48 hour(s))  Glucose, capillary     Status: Abnormal   Collection Time: 09/30/16  4:31 PM  Result Value Ref Range   Glucose-Capillary 272 (H) 65 - 99 mg/dL   Comment 1 Notify RN   Glucose, capillary     Status: Abnormal   Collection Time: 09/30/16  9:47 PM  Result Value Ref Range   Glucose-Capillary 307 (H) 65 - 99 mg/dL  Glucose, capillary     Status: None   Collection Time: 10/01/16  6:49 AM  Result Value Ref Range   Glucose-Capillary 79 65 - 99 mg/dL  Glucose, capillary     Status: Abnormal   Collection Time: 10/01/16 11:44 AM  Result Value Ref Range   Glucose-Capillary 241 (H) 65 - 99 mg/dL    Comment 1 Document in Chart   Glucose, capillary     Status: Abnormal   Collection Time: 10/01/16  4:30 PM  Result Value Ref  Range   Glucose-Capillary 190 (H) 65 - 99 mg/dL   Comment 1 Notify RN   Glucose, capillary     Status: None   Collection Time: 10/01/16  8:41 PM  Result Value Ref Range   Glucose-Capillary 95 65 - 99 mg/dL  Glucose, capillary     Status: Abnormal   Collection Time: 10/02/16  6:39 AM  Result Value Ref Range   Glucose-Capillary 161 (H) 65 - 99 mg/dL  Glucose, capillary     Status: Abnormal   Collection Time: 10/02/16 11:26 AM  Result Value Ref Range   Glucose-Capillary 241 (H) 65 - 99 mg/dL    Blood Alcohol level:  Lab Results  Component Value Date   ETH <5 09/23/2016   ETH <5 07/24/2016    Metabolic Disorder Labs: Lab Results  Component Value Date   HGBA1C 11.2 (H) 09/24/2016   MPG 275 09/24/2016   MPG 321 07/07/2016   Lab Results  Component Value Date   PROLACTIN 24.5 (H) 09/24/2016   PROLACTIN 3.4 (L) 07/07/2016   Lab Results  Component Value Date   CHOL 120 07/07/2016   TRIG 153 (H) 07/07/2016   HDL 33 (L) 07/07/2016   CHOLHDL 3.6 07/07/2016   VLDL 31 07/07/2016   LDLCALC 56 07/07/2016   LDLCALC 99 02/08/2016    Physical Findings: AIMS: Facial and Oral Movements Muscles of Facial Expression: Severe Lips and Perioral Area: Severe Jaw: None, normal Tongue: Moderate,Extremity Movements Upper (arms, wrists, hands, fingers): Mild Lower (legs, knees, ankles, toes): None, normal, Trunk Movements Neck, shoulders, hips: None, normal, Overall Severity Severity of abnormal movements (highest score from questions above): Moderate Incapacitation due to abnormal movements: None, normal Patient's awareness of abnormal movements (rate only patient's report): No Awareness, Dental Status Current problems with teeth and/or dentures?: No Does patient usually wear dentures?: No  CIWA:    COWS:     Musculoskeletal: Strength & Muscle Tone: within  normal limits Gait & Station: normal Patient leans: [name]  Psychiatric Specialty Exam: Physical Exam  Nursing note and vitals reviewed. Constitutional: He appears well-developed and well-nourished.  Neurological: He displays tremor.  Patient's neck does appear to be stiff. He is able to move it but he is having pain. He has tremors all over. Definitely has cogwheel rigidity in his arms.  Psychiatric: His speech is normal. Cognition and memory are normal.    Review of Systems  Constitutional: Negative.   HENT: Negative.   Eyes: Negative.   Respiratory: Negative.   Cardiovascular: Negative.   Gastrointestinal: Negative.   Genitourinary: Negative.   Musculoskeletal: Positive for neck pain. Negative for back pain, falls, joint pain and myalgias.  Skin: Negative.   Neurological: Positive for tremors. Negative for dizziness, tingling, sensory change, speech change, focal weakness, seizures and headaches.  Endo/Heme/Allergies: Negative.   Psychiatric/Behavioral: Negative for depression, hallucinations, memory loss, substance abuse and suicidal ideas. The patient is nervous/anxious. The patient does not have insomnia.     Blood pressure 117/82, pulse 62, temperature 98.5 F (36.9 C), temperature source Oral, resp. rate 18, height 5\' 8"  (1.727 m), weight 80.7 kg (178 lb), SpO2 99 %.Body mass index is 27.06 kg/m.  General Appearance: Disheveled  Eye Contact:  Good  Speech:  Slow  Volume:  Decreased  Mood:  Anxious and Dysphoric  Affect:  Appropriate  Thought Process:  Linear and Descriptions of Associations: Intact  Orientation:  NA  Thought Content:  Logical and Hallucinations: None  Suicidal Thoughts:  No  Homicidal Thoughts:  No  Memory:  Immediate;   Fair Recent;   Poor Remote;   Poor  Judgement:  Impaired  Insight:  Shallow  Psychomotor Activity:  Normal  Concentration:  Concentration: Fair and Attention Span: Fair  Recall:  Poor  Fund of Knowledge:  Poor  Language:  Fair   Akathisia:  No  Handed:    AIMS (if indicated):     Assets:    ADL's:  Intact  Cognition:  Impaired,  Mild  Sleep:  Number of Hours: 5.25     Treatment Plan Summary:  Tony Cunningham is a 41 year old male with a history of schizoaffective disorder admitted for worsening depression, SI and hallucinations. There is also a history of abusing opiates, Lyrica, Remeron and Flexeril.  Suicidal ideation. The patient is able to contract for safety in the hospital.  Mood and psychosis. Zyprexa was discontinued on admission due to poorly controlled diabetes. We briefly gave Geodon but it caused akathisia. We startedLatuda. He currently takes 80 mg. We continued Effexor. Plan to discontinue Lamictal as patient is already on gabapentin  For anxiety the patient will be discharged on Effexor, gabapentin and Valium  EPS. We continued amantadine 100 mg po bid  Insomnia: currently on arge him Valium 10 mg daily at bedtime  History of abusing opiates, Flexeril, Lyrica and mirtazapine. We'll benefit from continuing outpatient substance abuse services.   Diabetes. We continued Metformin and adjusted insulin according to diabetes nurse coordinator suggestions. Today it is Lantus to 39 units at bedtime and Novolog 7 units with meals. He continues on ADA, SSI and blood glucose monitoring. Unfortunately, the patient consumes vast amount og snacks.  Hypertension. Continue lisinopril and Hydralazine.  Dyslipidemia. Continue Zocor.   Asthma. Continue Singulair 10 mg at bedtime  Tobacco use disorder. Nicotine patch is available.   Metabolic syndrome monitoring. Hemoglobin A1c, lipid panel, TSH all completed in Feb 2018.  EKG. Normal sinus rhythm, QTc 433.  Disposition. He will be discharged back to his group home. He will follow up with Dr. Mila Palmer at [facility] and continue day program. Case coordination was discussed with the SW.   We'll try to discharge in the next 24-48 hours as patient reports he is  doing better.   [name], MD 10/02/2016, 12:35 PM

## 2016-10-03 LAB — GLUCOSE, CAPILLARY
GLUCOSE-CAPILLARY: 269 mg/dL — AB (ref 65–99)
GLUCOSE-CAPILLARY: 246 mg/dL — AB (ref 65–99)

## 2016-10-03 NOTE — [facility] Group Notes (Signed)
[facility] Group Notes:  (Nursing/MHT/Case Management/Adjunct)  Date:  10/03/2016  Time:  2:14 AM  Type of Therapy:  Psychoeducational Skills  Participation Level:  Did Not Attend   Foy Guadalajara 10/03/2016, 2:14 AM

## 2016-10-03 NOTE — Progress Notes (Signed)
D: Pt denies SI/HI/AVH. Pt is pleasant and cooperative. Pt stated he was feeling better. Pt seen walking the unit at times, but kept to himself a lot.   A: Pt was offered support and encouragement. Pt was given scheduled medications. Pt was encourage to attend groups. Q 15 minute checks were done for safety.   R:Pt attends groups and interacts well with peers and staff. Pt is taking medication. Pt has no complaints.Pt receptive to treatment and safety maintained on unit.

## 2016-10-03 NOTE — NC FL2 (Signed)
MEDICAID FL2 LEVEL OF CARE SCREENING TOOL     IDENTIFICATION  Patient Name: Tony Cunningham Birthdate: 24-Jul-1975 Sex: male Admission Date (Current Location): 09/23/2016  [place] and [place] Number:  [address] [id number] T Facility and Address:  [facility], [address] [place], [place] [address]      Provider Number: [id number]  Attending Physician Name and Address:  Van Clines*  Relative Name and Phone Number:  [name], mother.  [phone]    Current Level of Care: Hospital Recommended Level of Care: Family Care Home Prior Approval Number:    Date Approved/Denied:   PASRR Number:    Discharge Plan: Other (Comment) (group home)    Current Diagnoses: Patient Active Problem List   Diagnosis Date Noted  . Suicidal ideation 09/23/2016  . Schizoaffective disorder, depressive type (HCC) 09/23/2016  . Polysubstance abuse 07/17/2016  . Tardive dyskinesia 07/16/2016  . Tobacco use disorder 07/07/2016  . Dyslipidemia 07/07/2016  . Asthma 07/07/2016  . HTN (hypertension) 07/06/2016  . Diabetes (HCC) 12/25/2010    Orientation RESPIRATION BLADDER Height & Weight     Self, Time, Situation, Place  Normal Continent Weight: 178 lb (80.7 kg) Height:  5\' 8"  (172.7 cm)  BEHAVIORAL SYMPTOMS/MOOD NEUROLOGICAL BOWEL NUTRITION STATUS   (na)  (na) Continent  (na)  AMBULATORY STATUS COMMUNICATION OF NEEDS Skin   Independent Verbally Normal                       Personal Care Assistance Level of Assistance   (na)           Functional Limitations Info   (na)          SPECIAL CARE FACTORS FREQUENCY   (na)                    Contractures Contractures Info: Not present    Additional Factors Info   (na)               Current Medications (10/03/2016):  This is the current hospital active medication list Current Facility-Administered Medications  Medication Dose Route Frequency Provider Last Rate  Last Dose  . acetaminophen (TYLENOL) tablet 650 mg  650 mg Oral Q6H PRN [name], 12/03/2016, MD   650 mg at 10/02/16 1730  . alum & mag hydroxide-simeth (MAALOX/MYLANTA) 200-200-20 MG/5ML suspension 30 mL  30 mL Oral Q4H PRN [name], [name], MD      . amantadine (SYMMETREL) capsule 100 mg  100 mg Oral BID [name], 12/02/16, MD   100 mg at 10/03/16 0917  . gabapentin (NEURONTIN) capsule 300 mg  300 mg Oral TID 12/03/16, MD   300 mg at 10/03/16 1146  . hydrALAZINE (APRESOLINE) tablet 25 mg  25 mg Oral Q8H [name], 12/03/16, MD   25 mg at 10/03/16 12/03/16  . insulin aspart (novoLOG) injection 0-15 Units  0-15 Units Subcutaneous TID WC 07-24-1984, MD   8 Units at 10/03/16 [phone]  . insulin aspart (novoLOG) injection 0-5 Units  0-5 Units Subcutaneous QHS 0093, MD   4 Units at 09/30/16 2308  . insulin aspart (novoLOG) injection 7 Units  7 Units Subcutaneous TID WC [name], [name], MD   7 Units at 10/03/16 0928  . insulin glargine (LANTUS) injection 39 Units  39 Units Subcutaneous QHS [name], [name], MD   39 Units at 10/02/16 2125  . lisinopril (PRINIVIL,ZESTRIL) tablet 20 mg  20 mg Oral Daily [name], [name],  MD   20 mg at 10/03/16 0918  . lurasidone (LATUDA) tablet 80 mg  80 mg Oral Q supper [name], [name], MD   80 mg at 10/02/16 1728  . magnesium hydroxide (MILK OF MAGNESIA) suspension 30 mL  30 mL Oral Daily PRN [name], [name], MD      . metFORMIN (GLUCOPHAGE) tablet 1,000 mg  1,000 mg Oral BID WC [name], [name], MD   1,000 mg at 10/03/16 0917  . montelukast (SINGULAIR) tablet 10 mg  10 mg Oral QHS [name], [name], MD   10 mg at 10/02/16 2124  . nicotine (NICODERM CQ - dosed in mg/24 hours) patch 21 mg  21 mg Transdermal Daily [name], MD   21 mg at 10/03/16 0917  . simvastatin (ZOCOR) tablet 40 mg  40 mg Oral q1800 [name], [name], MD   40 mg at 10/02/16 1728  . temazepam (RESTORIL) capsule 30 mg  30 mg Oral QHS  [name], [name], MD   30 mg at 10/02/16 2125  . venlafaxine XR (EFFEXOR-XR) 24 hr capsule 150 mg  150 mg Oral Q breakfast [name], [name], MD   150 mg at 10/03/16 9485     Discharge Medications: Please see discharge summary for a list of discharge medications.  Relevant Imaging Results:  Relevant Lab Results:   Additional Information None  [name], LCSW

## 2016-10-03 NOTE — Progress Notes (Signed)
D: Pt presents anxious, fidgety with flat affect on approach. Denies SI, HI, AVH and pain at time of assessment. Brightens up on interactions and forwards in his conversation with staff.  Rates his depression 0/10 and anxiety 4/10 "because I want to go back to my group home".  A: Scheduled medications administered as prescribed, effects monitored. D/C instructions including prescriptions and follow up appointment reviewed with pt and group home personnel Misty Stanley). All belongings in locker 15 returned to pt at time of departure. Encouraged pt voice concerns and comply with d/c orders as listed in AVS. Q 15 minutes safety checks maintained till time of d/c without self harm gestures or outburst.  R: Pt receptive to care. Verbalized understanding related to d/c instructions. Ambulatory with a steady gait. Appears to be in no physical distress at this time.

## 2016-10-03 NOTE — Plan of Care (Signed)
Problem: Coping: Goal: Ability to cope will improve Outcome: Progressing Pt stated he was doing better this evening, and was seen walking the unit appearing to be in no acute distress

## 2016-10-05 NOTE — Progress Notes (Signed)
Recreation Therapy Notes  INPATIENT RECREATION TR PLAN  Patient Details Name: Tony Cunningham MRN: [id number] DOB: 1975-11-07 Today's Date: 10/05/2016  Rec Therapy Plan Is patient appropriate for Therapeutic Recreation?: Yes Treatment times per week: At least once a week TR Treatment/Interventions: 1:1 session, Group participation (Comment) (Appropriate participation in daily recreational therapy)  Discharge Criteria Pt will be discharged from therapy if:: Treatment goals are met, Discharged Treatment plan/goals/alternatives discussed and agreed upon by:: Patient/family  Discharge Summary Short term goals set: See Care Plan Short term goals met: Complete Progress toward goals comments: One-to-one attended Which groups?: Leisure education, Self-esteem One-to-one attended: Self-esteem Reason goals not met: [name] Therapeutic equipment acquired: None Reason patient discharged from therapy: Discharge from hospital Pt/family agrees with progress & goals achieved: Yes Date patient discharged from therapy: 10/03/16   Leonette Monarch, LRT/CTRS 10/05/2016, 5:39 PM

## 2016-10-07 ENCOUNTER — Emergency Department
Admission: EM | Admit: 2016-10-07 | Discharge: 2016-10-07 | Disposition: A | Discharge status: Home / Self Care | Payer: Medicare Other | Attending: Emergency Medicine | Admitting: Emergency Medicine

## 2016-10-07 ENCOUNTER — Encounter: Payer: Self-pay | Admitting: Emergency Medicine

## 2016-10-07 ENCOUNTER — Emergency Department
Admission: EM | Admit: 2016-10-07 | Discharge: 2016-10-08 | Disposition: A | Discharge status: Home / Self Care | Payer: Medicare Other | Attending: Emergency Medicine | Admitting: Emergency Medicine

## 2016-10-07 DIAGNOSIS — M542 Cervicalgia: Secondary | ICD-10-CM | POA: Diagnosis present

## 2016-10-07 DIAGNOSIS — R739 Hyperglycemia, unspecified: Secondary | ICD-10-CM

## 2016-10-07 DIAGNOSIS — E119 Type 2 diabetes mellitus without complications: Secondary | ICD-10-CM | POA: Insufficient documentation

## 2016-10-07 DIAGNOSIS — Z794 Long term (current) use of insulin: Secondary | ICD-10-CM | POA: Insufficient documentation

## 2016-10-07 DIAGNOSIS — F329 Major depressive disorder, single episode, unspecified: Secondary | ICD-10-CM | POA: Insufficient documentation

## 2016-10-07 DIAGNOSIS — Z79899 Other long term (current) drug therapy: Secondary | ICD-10-CM | POA: Insufficient documentation

## 2016-10-07 DIAGNOSIS — J45909 Unspecified asthma, uncomplicated: Secondary | ICD-10-CM | POA: Diagnosis not present

## 2016-10-07 DIAGNOSIS — F17210 Nicotine dependence, cigarettes, uncomplicated: Secondary | ICD-10-CM | POA: Diagnosis not present

## 2016-10-07 DIAGNOSIS — I10 Essential (primary) hypertension: Secondary | ICD-10-CM | POA: Diagnosis not present

## 2016-10-07 DIAGNOSIS — M436 Torticollis: Secondary | ICD-10-CM | POA: Insufficient documentation

## 2016-10-07 DIAGNOSIS — R451 Restlessness and agitation: Secondary | ICD-10-CM | POA: Diagnosis present

## 2016-10-07 DIAGNOSIS — F251 Schizoaffective disorder, depressive type: Secondary | ICD-10-CM

## 2016-10-07 DIAGNOSIS — F32A Depression, unspecified: Secondary | ICD-10-CM

## 2016-10-07 DIAGNOSIS — E1165 Type 2 diabetes mellitus with hyperglycemia: Secondary | ICD-10-CM | POA: Insufficient documentation

## 2016-10-07 LAB — CBC
HEMATOCRIT: 46.3 % (ref 40.0–52.0)
Hemoglobin: 16.2 g/dL (ref 13.0–18.0)
MCH: 32.1 pg (ref 26.0–34.0)
MCHC: 34.9 g/dL (ref 32.0–36.0)
MCV: 92 fL (ref 80.0–100.0)
Platelets: 242 10*3/uL (ref 150–440)
RBC: 5.04 MIL/uL (ref 4.40–5.90)
RDW: 13 % (ref 11.5–14.5)
WBC: 7.1 10*3/uL (ref 3.8–10.6)

## 2016-10-07 LAB — ACETAMINOPHEN LEVEL: Acetaminophen (Tylenol), Serum: <10 ug/mL — ABNORMAL LOW (ref 10–30)

## 2016-10-07 LAB — ETHANOL

## 2016-10-07 LAB — URINE DRUG SCREEN, QUALITATIVE (ARMC ONLY)
Amphetamines, Ur Screen: NOT DETECTED
Barbiturates, Ur Screen: NOT DETECTED
Benzodiazepine, Ur Scrn: POSITIVE — AB
COCAINE METABOLITE, UR ~~LOC~~: NOT DETECTED
Cannabinoid 50 Ng, Ur ~~LOC~~: NOT DETECTED
MDMA (Ecstasy)Ur Screen: NOT DETECTED
METHADONE SCREEN, URINE: NOT DETECTED
OPIATE, UR SCREEN: NOT DETECTED
Phencyclidine (PCP) Ur S: NOT DETECTED
Tricyclic, Ur Screen: NOT DETECTED

## 2016-10-07 LAB — COMPREHENSIVE METABOLIC PANEL
ALBUMIN: 4.2 g/dL (ref 3.5–5.0)
ALT: 25 U/L (ref 17–63)
AST: 20 U/L (ref 15–41)
Alkaline Phosphatase: 82 U/L (ref 38–126)
Anion gap: 9 (ref 5–15)
BILIRUBIN TOTAL: 0.4 mg/dL (ref 0.3–1.2)
BUN: 9 mg/dL (ref 6–20)
CO2: 28 mmol/L (ref 22–32)
CREATININE: 0.76 mg/dL (ref 0.61–1.24)
Calcium: 9.1 mg/dL (ref 8.9–10.3)
Chloride: 93 mmol/L — ABNORMAL LOW (ref 101–111)
GFR calc Af Amer: >60 mL/min (ref >60)
GLUCOSE: 534 mg/dL — AB (ref 65–99)
Potassium: 4.5 mmol/L (ref 3.5–5.1)
Sodium: 130 mmol/L — ABNORMAL LOW (ref 135–145)
TOTAL PROTEIN: 6.9 g/dL (ref 6.5–8.1)

## 2016-10-07 LAB — SALICYLATE LEVEL: Salicylate Lvl: <7 mg/dL (ref 2.8–30.0)

## 2016-10-07 LAB — GLUCOSE, CAPILLARY: Glucose-Capillary: 500 mg/dL — ABNORMAL HIGH (ref 65–99)

## 2016-10-07 MED ORDER — INSULIN ASPART 100 UNIT/ML ~~LOC~~ SOLN
15.0000 [IU] | Freq: Once | SUBCUTANEOUS | Status: AC
  Administered 2016-10-07: 15 [IU] via SUBCUTANEOUS
  Filled 2016-10-07: qty 15

## 2016-10-07 MED ORDER — DIAZEPAM 5 MG PO TABS
5.0000 mg | ORAL_TABLET | Freq: Once | ORAL | Status: AC
  Administered 2016-10-07: 5 mg via ORAL
  Filled 2016-10-07: qty 1

## 2016-10-07 MED ORDER — ACETAMINOPHEN 500 MG PO TABS
1000.0000 mg | ORAL_TABLET | Freq: Once | ORAL | Status: AC
  Administered 2016-10-07: 1000 mg via ORAL
  Filled 2016-10-07: qty 2

## 2016-10-07 MED ORDER — KETOROLAC TROMETHAMINE 30 MG/ML IJ SOLN
60.0000 mg | Freq: Once | INTRAMUSCULAR | Status: AC
  Administered 2016-10-07: 60 mg via INTRAMUSCULAR
  Filled 2016-10-07: qty 2

## 2016-10-07 MED ORDER — CYCLOBENZAPRINE HCL 10 MG PO TABS: 10.0000 mg | ORAL_TABLET | Freq: Three times a day (TID) | ORAL | 0 refills | Status: DC | PRN

## 2016-10-07 MED ORDER — IBUPROFEN 600 MG PO TABS
600.0000 mg | ORAL_TABLET | Freq: Once | ORAL | Status: AC
  Administered 2016-10-07: 600 mg via ORAL
  Filled 2016-10-07: qty 1

## 2016-10-07 NOTE — BH Assessment (Signed)
Pt psychiatrically cleared per Dr.[name].

## 2016-10-07 NOTE — BH Assessment (Signed)
Pt psychiatrically cleared and recommended for d/c per Dr. Toni Amend.

## 2016-10-07 NOTE — Discharge Instructions (Signed)
1. Please have Mr. Marcott get his prescriptions filled. 2. Apply warm compress to affected area several times daily. 3. Return to the ER for worsening symptoms, extremity weakness, numbness/tingling or other concerns.

## 2016-10-07 NOTE — BH Assessment (Addendum)
Tele Assessment Note   Tony Cunningham is an 41 y.o. male. Pt has h/o Schizoaffective d/o. Pt was d/c from [facility] on 5.12.18 after receiving treatment for SI and psychosis. Pt currently presents to ED with c/o neck pain and concerns regarding medication ineffectiveness. Pt reports reports  Increased anxiety and agitation. Pt denies SI/HI and current hallucinations. Pt denies access to weapons.  Pt does have h/o suicide attempt and multiple inpatient admissions. Pt responds "I don't know" when asked if he has a medication management provider. Pt reports no ongoing OPT. Pt states he has not slept in 40 days. Pt responses are delayed as if experiencing difficulty with processing however, does respond appropriately to all inquires.  Diagnosis: Schizoaffective d/o  Past Medical History:  Past Medical History:  Diagnosis Date  . Anxiety   . Asthma   . Diabetes mellitus   . High blood pressure   . Sinus complaint     History reviewed. No pertinent surgical history.  Family History: No family history on file.  Social History:  reports that he has been smoking Cigarettes.  He has been smoking about 0.50 packs per day. He has never used smokeless tobacco. He reports that he does not drink alcohol or use drugs.  Additional Social History:  Alcohol / Drug Use Pain Medications: Pt denies abuse. Prescriptions: Pt denies abuse. Over the Counter: Pt denies abuse. History of alcohol / drug use?: No history of alcohol / drug abuse  CIWA: CIWA-Ar BP: 124/76 Pulse Rate: 88 COWS:    PATIENT STRENGTHS: (choose at least two) Average or above average intelligence Supportive family/friends  Allergies: No Known Allergies  Home Medications:  (Not in a hospital admission)  OB/GYN Status:  No LMP for male patient.  General Assessment Data Location of Assessment: [facility] ED TTS Assessment: In system Is this a Tele or Face-to-Face Assessment?: Face-to-Face Is this an Initial Assessment or a  Re-assessment for this encounter?: Initial Assessment Marital status: Single Is patient pregnant?: No Pregnancy Status: No Living Arrangements: Group Home ([facility]) Can pt return to current living arrangement?: Yes Admission Status: Voluntary Is patient capable of signing voluntary admission?: Yes Referral Source: Self/Family/Friend Insurance type: Medicare     Crisis Care Plan Living Arrangements: Group Home [facility]) Name of Psychiatrist: Pt states "I don't know" Name of Therapist: None  Education Status Is patient currently in school?: No Highest grade of school patient has completed: 11  Risk to self with the past 6 months Suicidal Ideation: No-Not Currently/Within Last 6 Months Has patient been a risk to self within the past 6 months prior to admission? : Yes Suicidal Intent: No-Not Currently/Within Last 6 Months Has patient had any suicidal intent within the past 6 months prior to admission? : Yes Is patient at risk for suicide?: No Suicidal Plan?: No-Not Currently/Within Last 6 Months Has patient had any suicidal plan within the past 6 months prior to admission? : Yes Access to Means: No What has been your use of drugs/alcohol within the last 12 months?: Pt denies drug/alcohol use Previous Attempts/Gestures: Yes How many times?: 2 Other Self Harm Risks: Pt denies Triggers for Past Attempts: Unknown Intentional Self Injurious Behavior: None Family Suicide History: Unknown Recent stressful life event(s): Other (Comment) (Medication Ineffectiveness, Neck Pain) Persecutory voices/beliefs?: No Depression: Yes Depression Symptoms: Insomnia, Tearfulness, Isolating Substance abuse history and/or treatment for substance abuse?: No Suicide prevention information given to non-admitted patients: Not applicable  Risk to Others within the past  6 months Homicidal Ideation: No Does patient have any lifetime risk of violence toward others  beyond the six months prior to admission? : No Thoughts of Harm to Others: No Current Homicidal Intent: No Current Homicidal Plan: No Access to Homicidal Means: No History of harm to others?: No Does patient have access to weapons?: No Criminal Charges Pending?: No Does patient have a court date: No Is patient on probation?: No  Psychosis Hallucinations: None noted Delusions: None noted  Mental Status Report Appearance/Hygiene: In scrubs, Disheveled Eye Contact: Fair Motor Activity: Restlessness Speech: Slow Level of Consciousness: Quiet/awake Mood: Anxious Affect: Anxious Anxiety Level: Moderate Thought Processes: Relevant, Coherent Judgement: Unimpaired Orientation: Person, Place, Time, Situation Obsessive Compulsive Thoughts/Behaviors: None  Cognitive Functioning Concentration: Decreased Memory: Recent Intact, Remote Intact IQ: Average Insight: Fair Impulse Control: Fair Appetite: Fair Weight Loss:  (Pt unsure) Weight Gain:  (Pt unsure) Sleep: No Change Total Hours of Sleep: 0 (pt reports last sleeping 40 days ago) Vegetative Symptoms: None  ADLScreening [facility] Assessment Services) Patient's cognitive ability adequate to safely complete daily activities?: Yes Patient able to express need for assistance with ADLs?: Yes Independently performs ADLs?: Yes (appropriate for developmental age)  Prior Inpatient Therapy Prior Inpatient Therapy: Yes Prior Therapy Dates: Multiple Prior Therapy Facilty/Provider(s): [facility] Reason for Treatment: Schizoaffective d/o  Prior Outpatient Therapy Prior Outpatient Therapy: Yes Prior Therapy Dates: Unkwn Prior Therapy Facilty/Provider(s): Unkwn Reason for Treatment: Schizoaffective d/o Does patient have an ACCT team?: No Does patient have Intensive In-House Services?  : No Does patient have [facility] services? : Unknown Does patient have P4CC services?: No  ADL Screening (condition at time of admission) Patient's cognitive  ability adequate to safely complete daily activities?: Yes Is the patient deaf or have difficulty hearing?: No Does the patient have difficulty seeing, even when wearing glasses/contacts?: No Does the patient have difficulty concentrating, remembering, or making decisions?: Yes Patient able to express need for assistance with ADLs?: Yes Does the patient have difficulty dressing or bathing?: No Independently performs ADLs?: Yes (appropriate for developmental age) Does the patient have difficulty walking or climbing stairs?: No Weakness of Legs: None Weakness of Arms/Hands: None  Home Assistive Devices/Equipment Home Assistive Devices/Equipment: None  Therapy Consults (therapy consults require a physician order) PT Evaluation Needed: No OT Evalulation Needed: No SLP Evaluation Needed: No Abuse/Neglect Assessment (Assessment to be complete while patient is alone) Physical Abuse: Denies Verbal Abuse: Denies Sexual Abuse: Denies Exploitation of patient/patient's resources: Denies Self-Neglect: Denies Values / Beliefs Cultural Requests During Hospitalization: None Spiritual Requests During Hospitalization: None Consults Spiritual Care Consult Needed: No Social Work Consult Needed: No Merchant navy officer (For Healthcare) Does Patient Have a Medical Advance Directive?: No Would patient like information on creating a medical advance directive?: No - Patient declined    Additional Information 1:1 In Past 12 Months?: No CIRT Risk: No Elopement Risk: No Does patient have medical clearance?: No     Disposition:  Disposition Initial Assessment Completed for this Encounter: Yes Disposition of Patient: Other dispositions Other disposition(s): Other (Comment) (Psych MD Consult)  Quintana Canelo J [place] 10/07/2016 3:54 PM

## 2016-10-07 NOTE — ED Notes (Signed)
Pt given meal tray.

## 2016-10-07 NOTE — ED Provider Notes (Signed)
[facility] Emergency Department Provider Note  ____________________________________________   First MD Initiated Contact with Patient 10/07/16 1409     (approximate)  I have reviewed the triage vital signs and the nursing notes.   HISTORY  Chief Complaint Medical Clearance    HPI Tony Aguas is a 41 y.o. male who self presents to the emergency department with mobile crisis representative secondary to increasing anxiety and agitation. He has a complex psychiatric history and says that recently he has felt more anxious. To the nurse he denies suicidal ideation to me he said he no longer wants to live. He is unable to give a clear plan. He does have a psychiatrist but is not seen a psychiatrist in the past 3 weeks. He also reports roughly 24 hours of moderate severity aching bilateral neck pain worse with movement and improved with rest. He denies trauma.   Past Medical History:  Diagnosis Date  . Anxiety   . Asthma   . Diabetes mellitus   . High blood pressure   . Sinus complaint     Patient Active Problem List   Diagnosis Date Noted  . Suicidal ideation 09/23/2016  . Schizoaffective disorder, depressive type (HCC) 09/23/2016  . Polysubstance abuse 07/17/2016  . Tardive dyskinesia 07/16/2016  . Tobacco use disorder 07/07/2016  . Dyslipidemia 07/07/2016  . Asthma 07/07/2016  . HTN (hypertension) 07/06/2016  . Diabetes (HCC) 12/25/2010    History reviewed. No pertinent surgical history.  Prior to Admission medications   Medication Sig Start Date End Date Taking? Authorizing Provider  amantadine (SYMMETREL) 100 MG capsule Take 1 capsule (100 mg total) by mouth 2 (two) times daily. 10/02/16  Yes [name], MD  diazepam (VALIUM) 10 MG tablet Take 1 tablet (10 mg total) by mouth daily. Give daily with supper 10/02/16  Yes [name], MD  gabapentin (NEURONTIN) 300 MG capsule Take 1 capsule (300 mg total) by mouth 3  (three) times daily. 10/02/16  Yes [name], MD  hydrALAZINE (APRESOLINE) 25 MG tablet Take 1 tablet (25 mg total) by mouth every 8 (eight) hours. 10/02/16  Yes [name], MD  insulin aspart (NOVOLOG) 100 UNIT/ML injection Inject 7 Units into the skin 3 (three) times daily with meals. 10/02/16  Yes [name], MD  insulin glargine (LANTUS) 100 UNIT/ML injection Inject 0.39 mLs (39 Units total) into the skin at bedtime. 10/02/16  Yes [name], MD  lisinopril (PRINIVIL,ZESTRIL) 20 MG tablet Take 1 tablet (20 mg total) by mouth daily. 10/03/16  Yes [name], MD  lurasidone (LATUDA) 80 MG TABS tablet Take 1 tablet (80 mg total) by mouth daily with supper. 10/02/16  Yes [name], MD  meloxicam (MOBIC) 7.5 MG tablet Take 7.5 mg by mouth daily.   Yes [provider]  metFORMIN (GLUCOPHAGE) 1000 MG tablet Take 1 tablet (1,000 mg total) by mouth 2 (two) times daily with a meal. 07/16/16  Yes [name], MD  montelukast (SINGULAIR) 10 MG tablet Take 1 tablet (10 mg total) by mouth at bedtime. For Asthma 02/21/16  Yes Armandina Stammer I, NP  simvastatin (ZOCOR) 40 MG tablet Take 1 tablet (40 mg total) by mouth at bedtime. For high cholesterol 02/21/16  Yes Armandina Stammer I, NP  venlafaxine XR (EFFEXOR-XR) 150 MG 24 hr capsule Take 1 capsule (150 mg total) by mouth daily with breakfast. 10/03/16  Yes [name], MD    Allergies Patient has no known allergies.  No family history on file.  Social  History Social History  Substance Use Topics  . Smoking status: Current Every Day Smoker    Packs/day: 0.50    Types: Cigarettes  . Smokeless tobacco: Never Used  . Alcohol use No    Review of Systems Constitutional: No fever/chills Eyes: No visual changes. ENT: No sore throat. Cardiovascular: Denies chest pain. Respiratory: Denies shortness of breath. Gastrointestinal: No  abdominal pain.  No nausea, no vomiting.  No diarrhea.  No constipation. Genitourinary: Negative for dysuria. Musculoskeletal: Negative for back pain. Skin: Negative for rash. Neurological: Negative for headaches, focal weakness or numbness.   ____________________________________________   PHYSICAL EXAM:  VITAL SIGNS: ED Triage Vitals  Enc Vitals Group     BP 10/07/16 1322 124/76     Pulse Rate 10/07/16 1322 88     Resp 10/07/16 1322 15     Temp 10/07/16 1322 99.4 F (37.4 C)     Temp Source 10/07/16 1322 Oral     SpO2 10/07/16 1322 95 %     Weight 10/07/16 1322 170 lb (77.1 kg)     Height 10/07/16 1322 6' (1.829 m)     Head Circumference --      Peak Flow --      Pain Score 10/07/16 1331 9     Pain Loc --      Pain Edu? --      Excl. in GC? --     Constitutional: Flat affect speaks in slow methodical sentences Eyes: PERRL EOMI. Head: Atraumatic. Nose: No congestion/rhinnorhea. Mouth/Throat: No trismus Neck: No stridor.  No midline tenderness for range of motion some spasm with right greater than left paraspinal tenderness Cardiovascular: Normal rate, regular rhythm. Grossly normal heart sounds.  Good peripheral circulation. Respiratory: Normal respiratory effort.  No retractions. Lungs CTAB and moving good air Gastrointestinal: Soft nontender Musculoskeletal: No lower extremity edema   Neurologic:  Normal speech and language. No gross focal neurologic deficits are appreciated. Skin:  Skin is warm, dry and intact. No rash noted. Psychiatric: Odd and flat affect    _______________________________________   LABS (all labs ordered are listed, but only abnormal results are displayed)  Labs Reviewed  COMPREHENSIVE METABOLIC PANEL - Abnormal; Notable for the following:       Result Value   Sodium 130 (*)    Chloride 93 (*)    Glucose, Bld 534 (*)    All other components within normal limits  ACETAMINOPHEN LEVEL - Abnormal; Notable for the following:     Acetaminophen (Tylenol), Serum <10 (*)    All other components within normal limits  URINE DRUG SCREEN, QUALITATIVE ([facility] ONLY) - Abnormal; Notable for the following:    Benzodiazepine, Ur Scrn POSITIVE (*)    All other components within normal limits  ETHANOL  SALICYLATE LEVEL  CBC    Hyperglycemia with no signs of diabetic ketoacidosis __________________________________________  EKG   ____________________________________________  RADIOLOGY   ____________________________________________   PROCEDURES  Procedure(s) performed: no  Procedures  Critical Care performed: no  Observation: no ____________________________________________   INITIAL IMPRESSION / ASSESSMENT AND PLAN / ED COURSE  Pertinent labs & imaging results that were available during my care of the patient were reviewed by me and considered in my medical decision making (see chart for details).  The patient comes to the emergency department with 2 complaints. First she reports being suicidal and requesting to speak to a psychiatrist. Secondly he reports what sounds like musculoskeletal neck pain. He is hyperglycemic here but with no signs of DKA.  I will treat him with subcutaneous insulin as well as NSAIDs for his pain and consult psychiatry. He is medically stable for psychiatric evaluation.      ____________________________________________   FINAL CLINICAL IMPRESSION(S) / ED DIAGNOSES  Final diagnoses:  Depression, unspecified depression type  Hyperglycemia      NEW MEDICATIONS STARTED DURING THIS VISIT:  New Prescriptions   No medications on file     Note:  This document was prepared using Dragon voice recognition software and may include unintentional dictation errors.     Merrily Brittle, MD 10/07/16 [phone]

## 2016-10-07 NOTE — ED Triage Notes (Addendum)
Pt presents to ED via [facility] from [facility] for neck and shoulder pain. Pt was just DC from here today with diagnosis of pinched nerve and prescription of flexeril. Pt states he did not get prescription filled. Pt slow to answer, states he has memory loss. Alert and oriented, no distress noted. EMS reports pt ate supper. VSS with EMS.

## 2016-10-07 NOTE — ED Triage Notes (Signed)
Pt comes into the ED via POv with the mobile criss representative c/o increased anxiety and agitation.  Patient was recently released from inpatient behavioral health on new medication.  Patient still feels anxious but denies SI or HI.  Patient presents restless and poor attention at this time.  Patient c/o neck and back pain and feels as though "my health just isn't right".  Patient agrees to see the psychiatrist to make sure all medications are doing okay and that they can help with the anxiety and lack of sleep. Group home is [facility] and can be reached at [phone].  Representative is Tami Lin.

## 2016-10-07 NOTE — Consult Note (Signed)
[facility] Consult   Reason for Consult:  Consult for 41 year old man with a history of schizophrenia came voluntarily to the emergency room today because of neck pain Referring Physician:  [place] Patient Identification: Tony Cunningham MRN:  [id number] Principal Diagnosis: Schizoaffective disorder, depressive type [facility]) Diagnosis:   Patient Active Problem List   Diagnosis Date Noted  . Muscle spasm [M62.838] 10/07/2016  . Suicidal ideation [R45.851] 09/23/2016  . Schizoaffective disorder, depressive type ([place]) [F25.1] 09/23/2016  . Polysubstance abuse [F19.10] 07/17/2016  . Tardive dyskinesia [G24.01] 07/16/2016  . Tobacco use disorder [F17.200] 07/07/2016  . Dyslipidemia [E78.5] 07/07/2016  . Asthma [J45.909] 07/07/2016  . HTN (hypertension) [I10] 07/06/2016  . Diabetes ([place]) [E11.9] 12/25/2010    Total Time spent with patient: 1 hour  Subjective:   Tony Cunningham is a 41 y.o. male patient admitted with "neck pain and anxiety".  HPI:  Patient interviewed. Chart reviewed. Known from previous hospitalization. 41 year old man with schizoaffective disorder who was just discharged from the hospital 4 days ago returns voluntarily complaining of neck pain and anxiety. He tells me that his neck and shoulders have been hurting almost all of the time. He feels nervous and tense. Can't specify anything in particular he feels nervous about. Sleep has been poor and interrupted. He denies having any auditory hallucinations since leaving the hospital. Denies any feelings of aggression. Denies suicidal ideation. Denies that he's been abusing drugs or alcohol. Reports that he has been compliant with offered medication. No specific new stressor identified.  Medical history: Patient has diabetes insulin-dependent also asthma history of dyslipidemia and high blood pressure  Social history: Patient is residing in a group home. He's been there for a few years. He says that he is tolerating it  okay. He likes some the people who live there with him. He finds it boring but not awful.  Substance abuse history: Reportedly has a past history of substance abuse but denies that he's been using any drugs or alcohol recently and it does not seem to of been a recent part of his issue.  Past Psychiatric History: Several years of schizoaffective disorder chronic psychosis. Several previous hospitalizations. Just discharged 4 days ago after a relatively lengthy hospitalization here. Has had some trouble finding medication that is both effective and tolerable. The current Latuda seems to be as good as anything so far. Patient does have a past history of suicidality and agitation.  Risk to Self: Suicidal Ideation: No-Not Currently/Within Last 6 Months Suicidal Intent: No-Not Currently/Within Last 6 Months Is patient at risk for suicide?: No Suicidal Plan?: No-Not Currently/Within Last 6 Months Access to Means: No What has been your use of drugs/alcohol within the last 12 months?: Pt denies drug/alcohol use How many times?: 2 Other Self Harm Risks: Pt denies Triggers for Past Attempts: Unknown Intentional Self Injurious Behavior: None Risk to Others: Homicidal Ideation: No Thoughts of Harm to Others: No Current Homicidal Intent: No Current Homicidal Plan: No Access to Homicidal Means: No History of harm to others?: No Does patient have access to weapons?: No Criminal Charges Pending?: No Does patient have a court date: No Prior Inpatient Therapy: Prior Inpatient Therapy: Yes Prior Therapy Dates: Multiple Prior Therapy Facilty/Provider(s): [facility] Reason for Treatment: Schizoaffective d/o Prior Outpatient Therapy: Prior Outpatient Therapy: Yes Prior Therapy Dates: Unkwn Prior Therapy Facilty/Provider(s): [facility]wn Reason for Treatment: Schizoaffective d/o Does patient have an ACCT team?: No Does patient have Intensive In-House Services?  : No Does patient have [facility] services? : Unknown Does  patient have P4CC services?: No  Past Medical History:  Past Medical History:  Diagnosis Date  . Anxiety   . Asthma   . Diabetes mellitus   . High blood pressure   . Sinus complaint    History reviewed. No pertinent surgical history. Family History: No family history on file. Family Psychiatric  History: Denies any family history Social History:  History  Alcohol Use No     History  Drug Use No    Social History   Social History  . Marital status: Single    Spouse name: [name]  . Number of children: [name]  . Years of education: [name]   Social History Main Topics  . Smoking status: Current Every Day Smoker    Packs/day: 0.50    Types: Cigarettes  . Smokeless tobacco: Never Used  . Alcohol use No  . Drug use: No  . Sexual activity: Not Currently   Other Topics Concern  . None   Social History Narrative  . None   Additional Social History:    Allergies:  No Known Allergies  Labs:  Results for orders placed or performed during the hospital encounter of 10/07/16 (from the past 48 hour(s))  Comprehensive metabolic panel     Status: Abnormal   Collection Time: 10/07/16  1:35 PM  Result Value Ref Range   Sodium 130 (L) 135 - 145 mmol/L   Potassium 4.5 3.5 - 5.1 mmol/L   Chloride 93 (L) 101 - 111 mmol/L   CO2 28 22 - 32 mmol/L   Glucose, Bld 534 (HH) 65 - 99 mg/dL    Comment: CRITICAL RESULT CALLED TO, READ BACK BY AND VERIFIED WITH NELLIE MONAR ON 10/07/16 AT 1409 QSD    BUN 9 6 - 20 mg/dL   Creatinine, Ser 0.76 0.61 - 1.24 mg/dL   Calcium 9.1 8.9 - 10.3 mg/dL   Total Protein 6.9 6.5 - 8.1 g/dL   Albumin 4.2 3.5 - 5.0 g/dL   AST 20 15 - 41 U/L   ALT 25 17 - 63 U/L   Alkaline Phosphatase 82 38 - 126 U/L   Total Bilirubin 0.4 0.3 - 1.2 mg/dL   GFR calc non Af Amer >60 >60 mL/min   GFR calc Af Amer >60 >60 mL/min    Comment: (NOTE) The eGFR has been calculated using the CKD EPI equation. This calculation has not been validated in all clinical situations. eGFR's  persistently <60 mL/min signify possible Chronic Kidney Disease.    Anion gap 9 5 - 15  Ethanol     Status: None   Collection Time: 10/07/16  1:35 PM  Result Value Ref Range   Alcohol, Ethyl (B) <5 <5 mg/dL    Comment:        LOWEST DETECTABLE LIMIT FOR SERUM ALCOHOL IS 5 mg/dL FOR MEDICAL PURPOSES ONLY   Salicylate level     Status: None   Collection Time: 10/07/16  1:35 PM  Result Value Ref Range   Salicylate Lvl <9.4 2.8 - 30.0 mg/dL  Acetaminophen level     Status: Abnormal   Collection Time: 10/07/16  1:35 PM  Result Value Ref Range   Acetaminophen (Tylenol), Serum <10 (L) 10 - 30 ug/mL    Comment:        THERAPEUTIC CONCENTRATIONS VARY SIGNIFICANTLY. A RANGE OF 10-30 ug/mL MAY BE AN EFFECTIVE CONCENTRATION FOR MANY PATIENTS. HOWEVER, SOME ARE BEST TREATED AT CONCENTRATIONS OUTSIDE THIS RANGE. ACETAMINOPHEN CONCENTRATIONS >150 ug/mL AT 4 HOURS AFTER INGESTION  AND >50 ug/mL AT 12 HOURS AFTER INGESTION ARE OFTEN ASSOCIATED WITH TOXIC REACTIONS.   cbc     Status: None   Collection Time: 10/07/16  1:35 PM  Result Value Ref Range   WBC 7.1 3.8 - 10.6 K/uL   RBC 5.04 4.40 - 5.90 MIL/uL   Hemoglobin 16.2 13.0 - 18.0 g/dL   HCT 46.3 40.0 - 52.0 %   MCV 92.0 80.0 - 100.0 fL   MCH 32.1 26.0 - 34.0 pg   MCHC 34.9 32.0 - 36.0 g/dL   RDW 13.0 11.5 - 14.5 %   Platelets 242 150 - 440 K/uL  Urine Drug Screen, Qualitative     Status: Abnormal   Collection Time: 10/07/16  1:35 PM  Result Value Ref Range   Tricyclic, Ur Screen NONE DETECTED NONE DETECTED   Amphetamines, Ur Screen NONE DETECTED NONE DETECTED   MDMA (Ecstasy)Ur Screen NONE DETECTED NONE DETECTED   Cocaine Metabolite,Ur [place] NONE DETECTED NONE DETECTED   Opiate, Ur Screen NONE DETECTED NONE DETECTED   Phencyclidine (PCP) Ur S NONE DETECTED NONE DETECTED   Cannabinoid 50 Ng, Ur [place] NONE DETECTED NONE DETECTED   Barbiturates, Ur Screen NONE DETECTED NONE DETECTED   Benzodiazepine, Ur Scrn POSITIVE (A) NONE  DETECTED   Methadone Scn, Ur NONE DETECTED NONE DETECTED    Comment: (NOTE) 194  Tricyclics, urine               Cutoff 1000 ng/mL 200  Amphetamines, urine             Cutoff 1000 ng/mL 300  MDMA (Ecstasy), urine           Cutoff 500 ng/mL 400  Cocaine Metabolite, urine       Cutoff 300 ng/mL 500  Opiate, urine                   Cutoff 300 ng/mL 600  Phencyclidine (PCP), urine      Cutoff 25 ng/mL 700  Cannabinoid, urine              Cutoff 50 ng/mL 800  Barbiturates, urine             Cutoff 200 ng/mL 900  Benzodiazepine, urine           Cutoff 200 ng/mL 1000 Methadone, urine                Cutoff 300 ng/mL 1100 1200 The urine drug screen provides only a preliminary, unconfirmed 1300 analytical test result and should not be used for non-medical 1400 purposes. Clinical consideration and professional judgment should 1500 be applied to any positive drug screen result due to possible 1600 interfering substances. A more specific alternate chemical method 1700 must be used in order to obtain a confirmed analytical result.  1800 Gas chromato graphy / mass spectrometry (GC/MS) is the preferred 1900 confirmatory method.   Glucose, capillary     Status: Abnormal   Collection Time: 10/07/16  5:26 PM  Result Value Ref Range   Glucose-Capillary 500 (H) 65 - 99 mg/dL    No current facility-administered medications for this encounter.    Current Outpatient Prescriptions  Medication Sig Dispense Refill  . amantadine (SYMMETREL) 100 MG capsule Take 1 capsule (100 mg total) by mouth 2 (two) times daily. 60 capsule 0  . diazepam (VALIUM) 10 MG tablet Take 1 tablet (10 mg total) by mouth daily. Give daily with supper 30 tablet 0  . gabapentin (NEURONTIN) 300 MG  capsule Take 1 capsule (300 mg total) by mouth 3 (three) times daily. 90 capsule 0  . hydrALAZINE (APRESOLINE) 25 MG tablet Take 1 tablet (25 mg total) by mouth every 8 (eight) hours. 90 tablet 0  . insulin aspart (NOVOLOG) 100 UNIT/ML  injection Inject 7 Units into the skin 3 (three) times daily with meals. 7 mL 0  . insulin glargine (LANTUS) 100 UNIT/ML injection Inject 0.39 mLs (39 Units total) into the skin at bedtime. 12 mL 0  . lisinopril (PRINIVIL,ZESTRIL) 20 MG tablet Take 1 tablet (20 mg total) by mouth daily. 30 tablet 0  . lurasidone (LATUDA) 80 MG TABS tablet Take 1 tablet (80 mg total) by mouth daily with supper. 30 tablet 0  . meloxicam (MOBIC) 7.5 MG tablet Take 7.5 mg by mouth daily.    . metFORMIN (GLUCOPHAGE) 1000 MG tablet Take 1 tablet (1,000 mg total) by mouth 2 (two) times daily with a meal. 60 tablet 0  . montelukast (SINGULAIR) 10 MG tablet Take 1 tablet (10 mg total) by mouth at bedtime. For Asthma 30 tablet 0  . simvastatin (ZOCOR) 40 MG tablet Take 1 tablet (40 mg total) by mouth at bedtime. For high cholesterol 15 tablet 0  . venlafaxine XR (EFFEXOR-XR) 150 MG 24 hr capsule Take 1 capsule (150 mg total) by mouth daily with breakfast. 30 capsule 0  . cyclobenzaprine (FLEXERIL) 10 MG tablet Take 1 tablet (10 mg total) by mouth 3 (three) times daily as needed for muscle spasms. 60 tablet 0    Musculoskeletal: Strength & Muscle Tone: within normal limits Gait & Station: normal Patient leans: [name]  Psychiatric Specialty Exam: Physical Exam  Nursing note and vitals reviewed. Constitutional: He appears well-developed and well-nourished.  HENT:  Head: Normocephalic and atraumatic.  Eyes: Conjunctivae are normal. Pupils are equal, round, and reactive to light.  Neck: Normal range of motion.  Cardiovascular: Regular rhythm and normal heart sounds.   Respiratory: Effort normal. No respiratory distress.  GI: Soft.  Musculoskeletal: Normal range of motion.  Neurological: He is alert.  Skin: Skin is warm and dry.  Psychiatric: His mood appears anxious. His speech is delayed. He is slowed. Thought content is paranoid. Cognition and memory are impaired. He expresses inappropriate judgment. He expresses no  homicidal and no suicidal ideation. He exhibits abnormal recent memory.    Review of Systems  Constitutional: Negative.   HENT: Negative.   Eyes: Negative.   Respiratory: Negative.   Cardiovascular: Negative.   Gastrointestinal: Negative.   Musculoskeletal: Positive for myalgias and neck pain.  Skin: Negative.   Neurological: Negative.   Psychiatric/Behavioral: Negative for depression, hallucinations, memory loss, substance abuse and suicidal ideas. The patient is nervous/anxious and has insomnia.     Blood pressure 124/76, pulse 88, temperature 99.4 F (37.4 C), temperature source Oral, resp. rate 15, height 6' (1.829 m), weight 77.1 kg (170 lb), SpO2 95 %.Body mass index is 23.06 kg/m.  General Appearance: Casual  Eye Contact:  Fair  Speech:  Clear and Coherent and Slow  Volume:  Decreased  Mood:  Dysphoric  Affect:  Constricted  Thought Process:  Disorganized  Orientation:  Full (Time, Place, and Person)  Thought Content:  Rumination and Tangential  Suicidal Thoughts:  No  Homicidal Thoughts:  No  Memory:  Immediate;   Good Recent;   Fair Remote;   Fair  Judgement:  Fair  Insight:  Shallow  Psychomotor Activity:  Decreased  Concentration:  Concentration: Good  Recall:  Fair  Fund of Knowledge:  Fair  Language:  Fair  Akathisia:  No  Handed:  Right  AIMS (if indicated):     Assets:  Desire for Improvement Financial Resources/Insurance Housing Resilience  ADL's:  Intact  Cognition:  Impaired,  Mild  Sleep:        Treatment Plan Summary: Daily contact with patient to assess and evaluate symptoms and progress in treatment, Medication management and Plan This is a 41 year old man with chronic psychotic and mood disorder who returns to the hospital with what appears to probably be psychosomatic pain. I recall very clearly from his recent hospitalization that the patient would develop psychosomatic muscle tension and pain when nervous. This seems to be the same thing.  He is not reporting acute psychotic symptoms and there is no evidence that he is acutely dangerous. I reviewed his medicines on discharge. He claims that they are not giving him anything for anxiety at home although if he is on what the discharge summary indicates he should be getting Valium at night as well as several other medications. I suggest a muscle relaxer as a reasonable compromise given all the other medicine he is on. Prescription written for Flexeril 10 mg 2 or 3 times a day as needed for muscle pain. Patient does not require hospitalization or involuntary commitment. Supportive counseling and explanation of plan with patient and ER doctor and TTS.  Disposition: Patient does not meet criteria for psychiatric inpatient admission. Supportive therapy provided about ongoing stressors.  Alethia Berthold, MD 10/07/2016 5:31 PM

## 2016-10-07 NOTE — ED Provider Notes (Signed)
[facility] Emergency Department Provider Note   ____________________________________________   First MD Initiated Contact with Patient 10/07/16 2326     (approximate)  I have reviewed the triage vital signs and the nursing notes.   HISTORY  Chief Complaint Neck Pain    HPI Jaxiel Kines is a 41 y.o. male who returns to the ED from group home for neck and shoulder pain. Patient was just discharged by psychiatry approximately 5:30 PM; seen foranxiety/agitation. While he was in the ED he also complained of a 1-2 day history of neck and shoulder pain bilaterally worse with movement. Thinks he may have "slept funny". States his group home did not get his prescription filled. In fact, when I asked him if he was taking the Valium as prescribed by his psychiatrist on 5/11, the patient tells me that the group home has not filled that either. Denies associated extremity weakness, numbness or tingling. Denies recent fever, chills, chest pain, shortness of breath, abdominal pain, nausea, vomiting, diarrhea. Denies recent travel or trauma.   Past Medical History:  Diagnosis Date  . Anxiety   . Asthma   . Diabetes mellitus   . High blood pressure   . Sinus complaint     Patient Active Problem List   Diagnosis Date Noted  . Muscle spasm 10/07/2016  . Suicidal ideation 09/23/2016  . Schizoaffective disorder, depressive type ([facility]) 09/23/2016  . Polysubstance abuse 07/17/2016  . Tardive dyskinesia 07/16/2016  . Tobacco use disorder 07/07/2016  . Dyslipidemia 07/07/2016  . Asthma 07/07/2016  . HTN (hypertension) 07/06/2016  . Diabetes ([facility]) 12/25/2010    History reviewed. No pertinent surgical history.  Prior to Admission medications   Medication Sig Start Date End Date Taking? Authorizing Provider  amantadine (SYMMETREL) 100 MG capsule Take 1 capsule (100 mg total) by mouth 2 (two) times daily. 10/02/16   [name], MD  cyclobenzaprine  (FLEXERIL) 10 MG tablet Take 1 tablet (10 mg total) by mouth 3 (three) times daily as needed for muscle spasms. 10/07/16   [name], [name], MD  diazepam (VALIUM) 10 MG tablet Take 1 tablet (10 mg total) by mouth daily. Give daily with supper 10/02/16   [name], MD  gabapentin (NEURONTIN) 300 MG capsule Take 1 capsule (300 mg total) by mouth 3 (three) times daily. 10/02/16   [name], MD  hydrALAZINE (APRESOLINE) 25 MG tablet Take 1 tablet (25 mg total) by mouth every 8 (eight) hours. 10/02/16   [name], MD  insulin aspart (NOVOLOG) 100 UNIT/ML injection Inject 7 Units into the skin 3 (three) times daily with meals. 10/02/16   [name], MD  insulin glargine (LANTUS) 100 UNIT/ML injection Inject 0.39 mLs (39 Units total) into the skin at bedtime. 10/02/16   [name], MD  lisinopril (PRINIVIL,ZESTRIL) 20 MG tablet Take 1 tablet (20 mg total) by mouth daily. 10/03/16   [name], MD  lurasidone (LATUDA) 80 MG TABS tablet Take 1 tablet (80 mg total) by mouth daily with supper. 10/02/16   [name], MD  meloxicam (MOBIC) 7.5 MG tablet Take 7.5 mg by mouth daily.    [provider]  metFORMIN (GLUCOPHAGE) 1000 MG tablet Take 1 tablet (1,000 mg total) by mouth 2 (two) times daily with a meal. 07/16/16   [name], MD  montelukast (SINGULAIR) 10 MG tablet Take 1 tablet (10 mg total) by mouth at bedtime. For Asthma 02/21/16   Armandina Stammer I, NP  simvastatin (ZOCOR) 40 MG tablet Take 1  tablet (40 mg total) by mouth at bedtime. For high cholesterol 02/21/16   Armandina Stammer I, NP  venlafaxine XR (EFFEXOR-XR) 150 MG 24 hr capsule Take 1 capsule (150 mg total) by mouth daily with breakfast. 10/03/16   [name], MD    Allergies Patient has no known allergies.  History reviewed. No pertinent family history.  Social History Social History  Substance Use  Topics  . Smoking status: Current Every Day Smoker    Packs/day: 0.50    Types: Cigarettes  . Smokeless tobacco: Never Used  . Alcohol use No    Review of Systems  Constitutional: No fever/chills. Eyes: No visual changes. ENT: No sore throat. Cardiovascular: Denies chest pain. Respiratory: Denies shortness of breath. Gastrointestinal: No abdominal pain.  No nausea, no vomiting.  No diarrhea.  No constipation. Genitourinary: Negative for dysuria. Musculoskeletal: Positive for neck pain. Skin: Negative for rash. Neurological: Negative for headaches, focal weakness or numbness.   ____________________________________________   PHYSICAL EXAM:  VITAL SIGNS: ED Triage Vitals  Enc Vitals Group     BP 10/07/16 2226 130/81     Pulse Rate 10/07/16 2226 72     Resp 10/07/16 2226 18     Temp 10/07/16 2226 98 F (36.7 C)     Temp Source 10/07/16 2226 Oral     SpO2 10/07/16 2226 100 %     Weight 10/07/16 2228 170 lb (77.1 kg)     Height 10/07/16 2228 6' (1.829 m)     Head Circumference --      Peak Flow --      Pain Score 10/07/16 2226 9     Pain Loc --      Pain Edu? --      Excl. in GC? --     Constitutional: Alert and oriented. Well appearing and in no acute distress. Eyes: Conjunctivae are normal. PERRL. EOMI. Head: Atraumatic. Nose: No congestion/rhinnorhea. Mouth/Throat: Mucous membranes are moist.  Oropharynx non-erythematous. Neck: No stridor.  No cervical spine tenderness to palpation.  Bilateral paraspinal muscle spasms. Decreased range of motion secondary to pain. No carotid bruits. Cardiovascular: Normal rate, regular rhythm. Grossly normal heart sounds.  Good peripheral circulation. Respiratory: Normal respiratory effort.  No retractions. Lungs CTAB. Gastrointestinal: Soft and nontender. No distention. No abdominal bruits. No CVA tenderness. Musculoskeletal: No lower extremity tenderness nor edema.  No joint effusions. Neurologic:  Normal speech and language. No  gross focal neurologic deficits are appreciated. No gait instability. Skin:  Skin is warm, dry and intact. No rash noted. Psychiatric: Mood and affect are flat. Speech and behavior are normal.  ____________________________________________   LABS (all labs ordered are listed, but only abnormal results are displayed)  Labs Reviewed - No data to display ____________________________________________  EKG  None ____________________________________________  RADIOLOGY  None ____________________________________________   PROCEDURES  Procedure(s) performed: None  Procedures  Critical Care performed: No  ____________________________________________   INITIAL IMPRESSION / ASSESSMENT AND PLAN / ED COURSE  Pertinent labs & imaging results that were available during my care of the patient were reviewed by me and considered in my medical decision making (see chart for details).  41 year old male with schizophrenia, diabetes and torticollis. Did not have his Flexeril filled from earlier visit. No focal neurological deficits on my examination. Does not exhibit unintentional or jerky movements suggestive of tardive dyskinesia, which patient has had previously. Feel most likely torticollis. Hold steroid taper given patient's diabetes and recent hyperglycemia. Patient tells me he is not actually taking Valium  which was prescribed by his psychiatrist because his group home has not yet filled the prescription. Will administer IM Toradol, oral Valium and referred to orthopedics for follow-up. Strict return precautions given. Patient verbalizes understanding and agrees with plan of care.   ____________________________________________   FINAL CLINICAL IMPRESSION(S) / ED DIAGNOSES  Final diagnoses:  Torticollis, acute      NEW MEDICATIONS STARTED DURING THIS VISIT:  New Prescriptions   No medications on file     Note:  This document was prepared using Dragon voice recognition  software and may include unintentional dictation errors.    Irean Hong, MD 10/08/16 [phone]

## 2016-10-07 NOTE — ED Notes (Signed)
Talked to Dr. Pershing Proud about pt care, no orders at this time

## 2016-10-07 NOTE — ED Notes (Signed)
[name] at bedside 

## 2016-10-07 NOTE — ED Provider Notes (Signed)
-----------------------------------------   5:34 PM on 10/07/2016 -----------------------------------------   Blood pressure 124/76, pulse 88, temperature 99.4 F (37.4 C), temperature source Oral, resp. rate 15, height 6' (1.829 m), weight 170 lb (77.1 kg), SpO2 95 %.  The patient had no acute events since last update.  Calm and cooperative at this time.    Patient's sugar is now 500. Given subcutaneous insulin. Will likely take longer for the sugar to resolve because of this. Has also eaten pizza in the interim. Says that he has his insulin as well as metformin at home and will comply with their usage. Deemed appropriate for discharge by Dr. Toni Amend was also given him a prescription for Flexeril. Patient will be discharged home. Not in DKA. No nausea or vomiting. Normal bicarbonate. Normal vital signs.    Myrna Blazer, MD 10/07/16 [phone]

## 2016-10-07 NOTE — ED Notes (Signed)
MD Rifenbark informed of critical lab value

## 2016-10-08 ENCOUNTER — Encounter: Payer: Self-pay | Admitting: Psychiatry

## 2016-10-08 ENCOUNTER — Inpatient Hospital Stay
Admission: AD | Admit: 2016-10-08 | Discharge: 2016-10-20 | DRG: 885 | Disposition: A | Discharge status: Home / Self Care | Payer: Medicare Other | Source: Intra-hospital | Attending: Psychiatry | Admitting: Psychiatry

## 2016-10-08 ENCOUNTER — Emergency Department
Admission: EM | Admit: 2016-10-08 | Discharge: 2016-10-08 | Disposition: A | Discharge status: Other Acute Inpatient Hospital | Payer: Medicare Other | Attending: Emergency Medicine | Admitting: Emergency Medicine

## 2016-10-08 DIAGNOSIS — E119 Type 2 diabetes mellitus without complications: Secondary | ICD-10-CM | POA: Diagnosis not present

## 2016-10-08 DIAGNOSIS — G2401 Drug induced subacute dyskinesia: Secondary | ICD-10-CM | POA: Diagnosis present

## 2016-10-08 DIAGNOSIS — R45851 Suicidal ideations: Secondary | ICD-10-CM | POA: Insufficient documentation

## 2016-10-08 DIAGNOSIS — E11649 Type 2 diabetes mellitus with hypoglycemia without coma: Secondary | ICD-10-CM | POA: Diagnosis present

## 2016-10-08 DIAGNOSIS — Z79899 Other long term (current) drug therapy: Secondary | ICD-10-CM | POA: Insufficient documentation

## 2016-10-08 DIAGNOSIS — R456 Violent behavior: Secondary | ICD-10-CM | POA: Diagnosis present

## 2016-10-08 DIAGNOSIS — J45909 Unspecified asthma, uncomplicated: Secondary | ICD-10-CM | POA: Diagnosis present

## 2016-10-08 DIAGNOSIS — F251 Schizoaffective disorder, depressive type: Secondary | ICD-10-CM | POA: Diagnosis present

## 2016-10-08 DIAGNOSIS — I10 Essential (primary) hypertension: Secondary | ICD-10-CM | POA: Diagnosis present

## 2016-10-08 DIAGNOSIS — E1165 Type 2 diabetes mellitus with hyperglycemia: Secondary | ICD-10-CM | POA: Diagnosis present

## 2016-10-08 DIAGNOSIS — G4700 Insomnia, unspecified: Secondary | ICD-10-CM | POA: Diagnosis not present

## 2016-10-08 DIAGNOSIS — M542 Cervicalgia: Secondary | ICD-10-CM | POA: Diagnosis present

## 2016-10-08 DIAGNOSIS — F1910 Other psychoactive substance abuse, uncomplicated: Secondary | ICD-10-CM | POA: Diagnosis present

## 2016-10-08 DIAGNOSIS — F17210 Nicotine dependence, cigarettes, uncomplicated: Secondary | ICD-10-CM | POA: Diagnosis present

## 2016-10-08 DIAGNOSIS — K117 Disturbances of salivary secretion: Secondary | ICD-10-CM | POA: Diagnosis not present

## 2016-10-08 DIAGNOSIS — E785 Hyperlipidemia, unspecified: Secondary | ICD-10-CM | POA: Diagnosis present

## 2016-10-08 DIAGNOSIS — F259 Schizoaffective disorder, unspecified: Secondary | ICD-10-CM | POA: Diagnosis not present

## 2016-10-08 DIAGNOSIS — Z915 Personal history of self-harm: Secondary | ICD-10-CM | POA: Diagnosis not present

## 2016-10-08 DIAGNOSIS — K5900 Constipation, unspecified: Secondary | ICD-10-CM | POA: Diagnosis present

## 2016-10-08 DIAGNOSIS — R4689 Other symptoms and signs involving appearance and behavior: Secondary | ICD-10-CM

## 2016-10-08 DIAGNOSIS — F17200 Nicotine dependence, unspecified, uncomplicated: Secondary | ICD-10-CM | POA: Diagnosis present

## 2016-10-08 DIAGNOSIS — F419 Anxiety disorder, unspecified: Secondary | ICD-10-CM | POA: Diagnosis present

## 2016-10-08 DIAGNOSIS — Z794 Long term (current) use of insulin: Secondary | ICD-10-CM

## 2016-10-08 DIAGNOSIS — F558 Abuse of other non-psychoactive substances: Secondary | ICD-10-CM | POA: Diagnosis not present

## 2016-10-08 LAB — GLUCOSE, CAPILLARY
GLUCOSE-CAPILLARY: 258 mg/dL — AB (ref 65–99)
Glucose-Capillary: 364 mg/dL — ABNORMAL HIGH (ref 65–99)
Glucose-Capillary: 196 mg/dL — ABNORMAL HIGH (ref 65–99)
Glucose-Capillary: 164 mg/dL — ABNORMAL HIGH (ref 65–99)

## 2016-10-08 MED ORDER — HYDROXYZINE HCL 25 MG PO TABS
ORAL_TABLET | ORAL | Status: AC
Start: 2016-10-08 — End: 2016-10-08
  Filled 2016-10-08: qty 2

## 2016-10-08 MED ORDER — VENLAFAXINE HCL ER 75 MG PO CP24
150.0000 mg | ORAL_CAPSULE | Freq: Every day | ORAL | Status: DC
  Administered 2016-10-09: 150 mg via ORAL
  Filled 2016-10-08: qty 2

## 2016-10-08 MED ORDER — METFORMIN HCL 500 MG PO TABS
1000.0000 mg | ORAL_TABLET | Freq: Two times a day (BID) | ORAL | Status: DC
Start: 2016-10-08 — End: 2016-10-08
  Administered 2016-10-08 (×2): 1000 mg via ORAL
  Filled 2016-10-08 (×2): qty 2

## 2016-10-08 MED ORDER — DIAZEPAM 5 MG PO TABS
10.0000 mg | ORAL_TABLET | Freq: Every day | ORAL | Status: DC
  Administered 2016-10-09 – 2016-10-10 (×2): 10 mg via ORAL
  Filled 2016-10-08 (×2): qty 2

## 2016-10-08 MED ORDER — ALUM & MAG HYDROXIDE-SIMETH 200-200-20 MG/5ML PO SUSP: 30.0000 mL | ORAL | Status: DC | PRN

## 2016-10-08 MED ORDER — GABAPENTIN 300 MG PO CAPS
300.0000 mg | ORAL_CAPSULE | Freq: Three times a day (TID) | ORAL | Status: DC
  Administered 2016-10-08 (×3): 300 mg via ORAL
  Filled 2016-10-08 (×3): qty 1

## 2016-10-08 MED ORDER — HYDRALAZINE HCL 25 MG PO TABS
25.0000 mg | ORAL_TABLET | Freq: Three times a day (TID) | ORAL | Status: DC
  Administered 2016-10-08 (×3): 25 mg via ORAL
  Filled 2016-10-08 (×4): qty 1

## 2016-10-08 MED ORDER — MELOXICAM 7.5 MG PO TABS
7.5000 mg | ORAL_TABLET | Freq: Every day | ORAL | Status: DC
  Administered 2016-10-08: 7.5 mg via ORAL
  Filled 2016-10-08: qty 1

## 2016-10-08 MED ORDER — INSULIN ASPART 100 UNIT/ML ~~LOC~~ SOLN: 0.0000 [IU] | Freq: Three times a day (TID) | SUBCUTANEOUS | Status: DC

## 2016-10-08 MED ORDER — VENLAFAXINE HCL ER 75 MG PO CP24
150.0000 mg | ORAL_CAPSULE | Freq: Every day | ORAL | Status: DC
  Administered 2016-10-08: 150 mg via ORAL
  Filled 2016-10-08: qty 2

## 2016-10-08 MED ORDER — SIMVASTATIN 40 MG PO TABS
40.0000 mg | ORAL_TABLET | Freq: Every day | ORAL | Status: DC
  Administered 2016-10-08: 40 mg via ORAL
  Filled 2016-10-08: qty 1

## 2016-10-08 MED ORDER — HYDROXYZINE HCL 25 MG PO TABS
25.0000 mg | ORAL_TABLET | Freq: Three times a day (TID) | ORAL | Status: DC | PRN
  Administered 2016-10-08: 25 mg via ORAL
  Filled 2016-10-08: qty 1

## 2016-10-08 MED ORDER — SIMVASTATIN 40 MG PO TABS
40.0000 mg | ORAL_TABLET | Freq: Every day | ORAL | Status: DC
  Administered 2016-10-09 – 2016-10-12 (×4): 40 mg via ORAL
  Filled 2016-10-08 (×4): qty 1

## 2016-10-08 MED ORDER — MAGNESIUM HYDROXIDE 400 MG/5ML PO SUSP: 30.0000 mL | Freq: Every day | ORAL | Status: DC | PRN

## 2016-10-08 MED ORDER — LISINOPRIL 20 MG PO TABS
20.0000 mg | ORAL_TABLET | Freq: Every day | ORAL | Status: DC
  Administered 2016-10-09 – 2016-10-20 (×10): 20 mg via ORAL
  Filled 2016-10-08 (×10): qty 1

## 2016-10-08 MED ORDER — HYDROXYZINE HCL 25 MG PO TABS
50.0000 mg | ORAL_TABLET | Freq: Once | ORAL | Status: AC
  Administered 2016-10-08: 50 mg via ORAL

## 2016-10-08 MED ORDER — INSULIN ASPART 100 UNIT/ML ~~LOC~~ SOLN
7.0000 [IU] | Freq: Three times a day (TID) | SUBCUTANEOUS | Status: DC
  Administered 2016-10-09 – 2016-10-14 (×16): 7 [IU] via SUBCUTANEOUS
  Filled 2016-10-08: qty 5
  Filled 2016-10-08: qty 1
  Filled 2016-10-08 (×4): qty 7
  Filled 2016-10-08: qty 2
  Filled 2016-10-08 (×2): qty 7
  Filled 2016-10-08: qty 3
  Filled 2016-10-08 (×7): qty 7

## 2016-10-08 MED ORDER — GABAPENTIN 300 MG PO CAPS
ORAL_CAPSULE | ORAL | Status: AC
  Filled 2016-10-08: qty 1

## 2016-10-08 MED ORDER — METFORMIN HCL 500 MG PO TABS
1000.0000 mg | ORAL_TABLET | Freq: Two times a day (BID) | ORAL | Status: DC
  Administered 2016-10-09 – 2016-10-20 (×24): 1000 mg via ORAL
  Filled 2016-10-08 (×24): qty 2

## 2016-10-08 MED ORDER — MONTELUKAST SODIUM 10 MG PO TABS
10.0000 mg | ORAL_TABLET | Freq: Every day | ORAL | Status: DC
  Administered 2016-10-08: 10 mg via ORAL
  Filled 2016-10-08: qty 1

## 2016-10-08 MED ORDER — LISINOPRIL 20 MG PO TABS
20.0000 mg | ORAL_TABLET | Freq: Every day | ORAL | Status: DC
  Administered 2016-10-08: 20 mg via ORAL
  Filled 2016-10-08: qty 2

## 2016-10-08 MED ORDER — INSULIN ASPART 100 UNIT/ML ~~LOC~~ SOLN: 0.0000 [IU] | Freq: Every day | SUBCUTANEOUS | Status: DC

## 2016-10-08 MED ORDER — INSULIN GLARGINE 100 UNIT/ML ~~LOC~~ SOLN
39.0000 [IU] | Freq: Every day | SUBCUTANEOUS | Status: DC
  Administered 2016-10-08: 39 [IU] via SUBCUTANEOUS
  Filled 2016-10-08 (×3): qty 0.39

## 2016-10-08 MED ORDER — HYDRALAZINE HCL 50 MG PO TABS
25.0000 mg | ORAL_TABLET | Freq: Three times a day (TID) | ORAL | Status: DC
  Filled 2016-10-08: qty 2

## 2016-10-08 MED ORDER — LURASIDONE HCL 40 MG PO TABS
80.0000 mg | ORAL_TABLET | Freq: Every day | ORAL | Status: DC
  Administered 2016-10-09: 80 mg via ORAL
  Filled 2016-10-08: qty 2

## 2016-10-08 MED ORDER — MELOXICAM 7.5 MG PO TABS
7.5000 mg | ORAL_TABLET | Freq: Every day | ORAL | Status: DC
  Administered 2016-10-09 – 2016-10-12 (×4): 7.5 mg via ORAL
  Filled 2016-10-08 (×4): qty 1

## 2016-10-08 MED ORDER — AMANTADINE HCL 100 MG PO CAPS
100.0000 mg | ORAL_CAPSULE | Freq: Two times a day (BID) | ORAL | Status: DC
  Administered 2016-10-09: 100 mg via ORAL
  Filled 2016-10-08: qty 1

## 2016-10-08 MED ORDER — MONTELUKAST SODIUM 10 MG PO TABS
10.0000 mg | ORAL_TABLET | Freq: Every day | ORAL | Status: DC
  Administered 2016-10-09 – 2016-10-12 (×4): 10 mg via ORAL
  Filled 2016-10-08 (×4): qty 1

## 2016-10-08 MED ORDER — INSULIN ASPART 100 UNIT/ML ~~LOC~~ SOLN
7.0000 [IU] | Freq: Three times a day (TID) | SUBCUTANEOUS | Status: DC
  Administered 2016-10-08 (×3): 7 [IU] via SUBCUTANEOUS
  Filled 2016-10-08 (×3): qty 7

## 2016-10-08 MED ORDER — ACETAMINOPHEN 325 MG PO TABS
650.0000 mg | ORAL_TABLET | Freq: Four times a day (QID) | ORAL | Status: DC | PRN
  Administered 2016-10-13: 650 mg via ORAL
  Filled 2016-10-08: qty 2

## 2016-10-08 MED ORDER — AMANTADINE HCL 100 MG PO CAPS
100.0000 mg | ORAL_CAPSULE | Freq: Two times a day (BID) | ORAL | Status: DC
  Administered 2016-10-08 (×2): 100 mg via ORAL
  Filled 2016-10-08 (×3): qty 1

## 2016-10-08 MED ORDER — INSULIN GLARGINE 100 UNIT/ML ~~LOC~~ SOLN
39.0000 [IU] | Freq: Every day | SUBCUTANEOUS | Status: DC
  Administered 2016-10-09 – 2016-10-12 (×4): 39 [IU] via SUBCUTANEOUS
  Filled 2016-10-08 (×5): qty 0.39

## 2016-10-08 MED ORDER — DIAZEPAM 5 MG PO TABS
10.0000 mg | ORAL_TABLET | Freq: Every day | ORAL | Status: DC
  Administered 2016-10-08: 10 mg via ORAL
  Filled 2016-10-08: qty 2

## 2016-10-08 MED ORDER — GABAPENTIN 300 MG PO CAPS
300.0000 mg | ORAL_CAPSULE | Freq: Three times a day (TID) | ORAL | Status: DC
  Administered 2016-10-09 – 2016-10-14 (×17): 300 mg via ORAL
  Filled 2016-10-08 (×17): qty 1

## 2016-10-08 MED ORDER — LURASIDONE HCL 80 MG PO TABS
80.0000 mg | ORAL_TABLET | Freq: Every day | ORAL | Status: DC
  Administered 2016-10-08: 80 mg via ORAL
  Filled 2016-10-08: qty 1

## 2016-10-08 NOTE — ED Notes (Signed)

## 2016-10-08 NOTE — ED Triage Notes (Signed)
Pt states he has been feeling aggressive towards the group home staff, denies any SI. Pt was seen here yesterday for the same.

## 2016-10-08 NOTE — ED Provider Notes (Signed)
[facility] Emergency Department Provider Note   ____________________________________________   First MD Initiated Contact with Patient 10/08/16 0304     (approximate)  I have reviewed the triage vital signs and the nursing notes.   HISTORY  Chief Complaint Aggressive Behavior    HPI Tony Cunningham is a 41 y.o. male who returns to the ED from group home accompanied by police with a chief complaint of aggression and anger. This is the patient's third visit to the ED in the past 24 hours. He has a history of schizoaffective disorder who was seen yesterday for same and discharged by psychiatry. He returned for a secondary complaint of neck spasm/torticollis. Shortly after he return to the group home, patient states he got upset with a staff member and started playing on the door with his fists. States he asked to come back before he hurt anyone. Currently is still angry but denies active SI/HI/AH/VH.He was given a Valium several hours ago and states his neck discomfort has improved. Currently voices no medical complaints. Requesting something for his anger.   Past Medical History:  Diagnosis Date  . Anxiety   . Asthma   . Diabetes mellitus   . High blood pressure   . Sinus complaint     Patient Active Problem List   Diagnosis Date Noted  . Muscle spasm 10/07/2016  . Suicidal ideation 09/23/2016  . Schizoaffective disorder, depressive type (HCC) 09/23/2016  . Polysubstance abuse 07/17/2016  . Tardive dyskinesia 07/16/2016  . Tobacco use disorder 07/07/2016  . Dyslipidemia 07/07/2016  . Asthma 07/07/2016  . HTN (hypertension) 07/06/2016  . Diabetes (HCC) 12/25/2010    No past surgical history on file.  Prior to Admission medications   Medication Sig Start Date End Date Taking? Authorizing Provider  amantadine (SYMMETREL) 100 MG capsule Take 1 capsule (100 mg total) by mouth 2 (two) times daily. 10/02/16  Yes [name], MD    cyclobenzaprine (FLEXERIL) 10 MG tablet Take 1 tablet (10 mg total) by mouth 3 (three) times daily as needed for muscle spasms. 10/07/16  Yes [name], [name], MD  diazepam (VALIUM) 10 MG tablet Take 1 tablet (10 mg total) by mouth daily. Give daily with supper 10/02/16  Yes [name], MD  gabapentin (NEURONTIN) 300 MG capsule Take 1 capsule (300 mg total) by mouth 3 (three) times daily. 10/02/16  Yes [name], MD  hydrALAZINE (APRESOLINE) 25 MG tablet Take 1 tablet (25 mg total) by mouth every 8 (eight) hours. 10/02/16  Yes [name], MD  insulin aspart (NOVOLOG) 100 UNIT/ML injection Inject 7 Units into the skin 3 (three) times daily with meals. 10/02/16  Yes [name], MD  insulin glargine (LANTUS) 100 UNIT/ML injection Inject 0.39 mLs (39 Units total) into the skin at bedtime. 10/02/16  Yes [name], MD  lisinopril (PRINIVIL,ZESTRIL) 20 MG tablet Take 1 tablet (20 mg total) by mouth daily. 10/03/16  Yes [name], MD  lurasidone (LATUDA) 80 MG TABS tablet Take 1 tablet (80 mg total) by mouth daily with supper. 10/02/16  Yes [name], MD  meloxicam (MOBIC) 7.5 MG tablet Take 7.5 mg by mouth daily.   Yes [provider]  metFORMIN (GLUCOPHAGE) 1000 MG tablet Take 1 tablet (1,000 mg total) by mouth 2 (two) times daily with a meal. 07/16/16  Yes [name], MD  montelukast (SINGULAIR) 10 MG tablet Take 1 tablet (10 mg total) by mouth at bedtime. For Asthma 02/21/16  Yes Sanjuana Kava, NP  simvastatin (ZOCOR) 40 MG tablet Take 1 tablet (40 mg total) by mouth at bedtime. For high cholesterol 02/21/16  Yes Armandina Stammer I, NP  venlafaxine XR (EFFEXOR-XR) 150 MG 24 hr capsule Take 1 capsule (150 mg total) by mouth daily with breakfast. 10/03/16  Yes [name], MD    Allergies Patient has no known allergies.  No family history on file.  Social  History Social History  Substance Use Topics  . Smoking status: Current Every Day Smoker    Packs/day: 0.50    Types: Cigarettes  . Smokeless tobacco: Never Used  . Alcohol use No    Review of Systems  Constitutional: No fever/chills. Eyes: No visual changes. ENT: No sore throat. Cardiovascular: Denies chest pain. Respiratory: Denies shortness of breath. Gastrointestinal: No abdominal pain.  No nausea, no vomiting.  No diarrhea.  No constipation. Genitourinary: Negative for dysuria. Musculoskeletal: Positive for neck pain. Negative for back pain. Skin: Negative for rash. Neurological: Negative for headaches, focal weakness or numbness. Psychiatric:Positive for anger and aggression. ____________________________________________   PHYSICAL EXAM:  VITAL SIGNS: ED Triage Vitals [10/08/16 0242]  Enc Vitals Group     BP 126/83     Pulse Rate 78     Resp 18     Temp 98.3 F (36.8 C)     Temp Source Oral     SpO2 100 %     Weight 170 lb (77.1 kg)     Height 5\' 9"  (1.753 m)     Head Circumference      Peak Flow      Pain Score 10     Pain Loc      Pain Edu?      Excl. in GC?     Constitutional: Alert and oriented. Well appearing and in no acute distress. Eyes: Conjunctivae are normal. PERRL. EOMI. Head: Atraumatic. Nose: No congestion/rhinnorhea. Mouth/Throat: Mucous membranes are moist.  Oropharynx non-erythematous. Neck: No stridor.  No cervical spine tenderness to palpation.  Decreased paraspinal muscle spasms from earlier exam. Cardiovascular: Normal rate, regular rhythm. Grossly normal heart sounds.  Good peripheral circulation. Respiratory: Normal respiratory effort.  No retractions. Lungs CTAB. Gastrointestinal: Soft and nontender. No distention. No abdominal bruits. No CVA tenderness. Musculoskeletal: No lower extremity tenderness nor edema.  No joint effusions. Neurologic:  Normal speech and language. No gross focal neurologic deficits are appreciated. No  gait instability. Skin:  Skin is warm, dry and intact. No rash noted. Psychiatric: Mood and affect are angry. Speech and behavior are normal.  ____________________________________________   LABS (all labs ordered are listed, but only abnormal results are displayed)  Labs Reviewed  CBG MONITORING, ED  CBG MONITORING, ED   ____________________________________________  EKG  None ____________________________________________  RADIOLOGY  None ____________________________________________   PROCEDURES  Procedure(s) performed: None  Procedures  Critical Care performed: No  ____________________________________________   INITIAL IMPRESSION / ASSESSMENT AND PLAN / ED COURSE  Pertinent labs & imaging results that were available during my care of the patient were reviewed by me and considered in my medical decision making (see chart for details).  41 year old male with schizoaffective disorder who returns to the ED anger and aggression. He is angry but cooperative. Patient had lab work and urinalysis within the past 24 hours. I have reviewed these and will order patient's home medications and Accu-Checks to monitor his blood sugar. Will keep patient voluntary in the ED pending TTS and psychiatry consults.  Clinical Course as of Oct 09 651  Thu Oct 08, 2016  1610 No events overnight. Patient sleeping. Remains voluntary pending psychiatry consult today.  [JS]    Clinical Course User Index [JS] Irean Hong, MD     ____________________________________________   FINAL CLINICAL IMPRESSION(S) / ED DIAGNOSES  Final diagnoses:  Schizoaffective disorder, unspecified type (HCC)  Aggression      NEW MEDICATIONS STARTED DURING THIS VISIT:  New Prescriptions   No medications on file     Note:  This document was prepared using Dragon voice recognition software and may include unintentional dictation errors.    Irean Hong, MD 10/08/16 [id number]

## 2016-10-08 NOTE — BH Assessment (Signed)
Patient is to be admitted to [facility] by Dr. Toni Amend.  Attending Physician will be Dr. Ardyth Harps.   Patient has been assigned to room 322, by [facility] Charge Nurse Escudilla Bonita F.   Intake Paper Work has been signed and placed on patient chart.  ER staff is aware of the admission Rivka Barbara, ER Sect.; Dr. Alphonzo Lemmings, ER MD; Vikki Ports Patient's Nurse & Leotis Shames, Patient Access).

## 2016-10-08 NOTE — Consult Note (Signed)
[name] Face-to-Face Psychiatry Consult   Reason for Consult:  Consult for 41 year old man with a history of mental illness who comes to the emergency room from his group home for the third time in 24 hours Referring Physician:  Scotty Court Patient Identification: Tony Cunningham MRN:  [id number] Principal Diagnosis: Schizoaffective disorder, depressive type [facility] Diagnosis:   Patient Active Problem List   Diagnosis Date Noted  . Muscle spasm [M62.838] 10/07/2016  . Suicidal ideation [R45.851] 09/23/2016  . Schizoaffective disorder, depressive type (HCC) [F25.1] 09/23/2016  . Polysubstance abuse [F19.10] 07/17/2016  . Tardive dyskinesia [G24.01] 07/16/2016  . Tobacco use disorder [F17.200] 07/07/2016  . Dyslipidemia [E78.5] 07/07/2016  . Asthma [J45.909] 07/07/2016  . HTN (hypertension) [I10] 07/06/2016  . Diabetes (HCC) [E11.9] 12/25/2010    Total Time spent with patient: 1 hour  Subjective:   Tony Cunningham is a 41 y.o. male patient admitted with "rage".  HPI:  Patient interviewed chart reviewed. Patient known from previous encounters. 41 year old man was brought from his group home last night with reports that he was angry hostile and agitated. This was his third presentation to the emergency room within 24 hours. The previous 2 presentations he had mostly been complaining about neck pain but had made it clear that this was also tied up with his anxiety. Patient tells me today that he feels so angry and enraged at the group home that he can't stay there. He is having thoughts about hurting someone else. Can't name anyone in particular. Denies thoughts about killing himself. He denies that he's been having hallucinations for the last day. He says he has been compliant with medicine. He was just discharged from the hospital on May 12 to this group home and returned here after only 4 days. Denies that he's been drinking or using drugs.  Social history: 41 year old man who appears to be chronically  disabled. Used to live with his parents more recently has been living in group homes. Does not seem to like it very well although he did tell me yesterday that he liked some of the people that he lived with. Parents I believe are still living.  Medical history: Patient has diabetes insulin-dependent. Hypertension. Dyslipidemia history of asthma. Possible tardive dyskinesia.  Substance abuse history: He denies any alcohol or drug abuse current or past  Past Psychiatric History: Patient has had several hospitalizations mostly clustered in recent years. Was at [facility] in 2017. 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 Diagnosis more recently has been schizoaffective disorder as it doesn't seem like he really ever returns to a functional baseline. Multiple antidepressants and antipsychotics have been tried. Zyprexa had been discontinued because of concern about elevated blood sugars. Geodon was tried briefly on his last hospitalization and more recently Jordan. History of suicide attempts or not. He has told me that he does have a history of cutting and suicide although there've been other times when he denied it. Denies having been seriously violent in the past although he sometimes presents as very angry  Risk to Self: Is patient at risk for suicide?: No Risk to Others:   Prior Inpatient Therapy:   Prior Outpatient Therapy:    Past Medical History:  Past Medical History:  Diagnosis Date  . Anxiety   . Asthma   . Diabetes mellitus   . High blood pressure   . Sinus complaint  No past surgical history on file. Family History: No family history on file. Family Psychiatric  History: Patient denies any family history of mental illness Social History:  History  Alcohol Use No     History  Drug Use No    Social History   Social History  . Marital status:  Single    Spouse name: [name]  . Number of children: [name]  . Years of education: [name]   Social History Main Topics  . Smoking status: Current Every Day Smoker    Packs/day: 0.50    Types: Cigarettes  . Smokeless tobacco: Never Used  . Alcohol use No  . Drug use: No  . Sexual activity: Not Currently   Other Topics Concern  . Not on file   Social History Narrative  . No narrative on file   Additional Social History:    Allergies:  No Known Allergies  Labs:  Results for orders placed or performed during the hospital encounter of 10/08/16 (from the past 48 hour(s))  Glucose, capillary     Status: Abnormal   Collection Time: 10/08/16  9:43 AM  Result Value Ref Range   Glucose-Capillary 364 (H) 65 - 99 mg/dL  Glucose, capillary     Status: Abnormal   Collection Time: 10/08/16 12:31 PM  Result Value Ref Range   Glucose-Capillary 258 (H) 65 - 99 mg/dL    Current Facility-Administered Medications  Medication Dose Route Frequency Provider Last Rate Last Dose  . amantadine (SYMMETREL) capsule 100 mg  100 mg Oral BID Irean Hong, MD   100 mg at 10/08/16 2549  . diazepam (VALIUM) tablet 10 mg  10 mg Oral Daily Irean Hong, MD   10 mg at 10/08/16 8264  . gabapentin (NEURONTIN) capsule 300 mg  300 mg Oral TID Irean Hong, MD   300 mg at 10/08/16 1527  . hydrALAZINE (APRESOLINE) tablet 25 mg  25 mg Oral Q8H Irean Hong, MD   25 mg at 10/08/16 1527  . insulin aspart (novoLOG) injection 7 Units  7 Units Subcutaneous TID WC Irean Hong, MD   7 Units at 10/08/16 1232  . insulin glargine (LANTUS) injection 39 Units  39 Units Subcutaneous QHS Irean Hong, MD      . lisinopril (PRINIVIL,ZESTRIL) tablet 20 mg  20 mg Oral Daily Irean Hong, MD   20 mg at 10/08/16 1583  . lurasidone (LATUDA) tablet 80 mg  80 mg Oral Q supper Irean Hong, MD      . meloxicam [facility]) tablet 7.5 mg  7.5 mg Oral Daily Irean Hong, MD   7.5 mg at 10/08/16 0940  . metFORMIN (GLUCOPHAGE) tablet 1,000 mg  1,000 mg  Oral BID WC Irean Hong, MD   1,000 mg at 10/08/16 7680  . montelukast (SINGULAIR) tablet 10 mg  10 mg Oral QHS Irean Hong, MD      . simvastatin (ZOCOR) tablet 40 mg  40 mg Oral QHS Irean Hong, MD      . venlafaxine XR [facility]) 24 hr capsule 150 mg  150 mg Oral Q breakfast Irean Hong, MD   150 mg at 10/08/16 8811   Current Outpatient Prescriptions  Medication Sig Dispense Refill  . amantadine (SYMMETREL) 100 MG capsule Take 1 capsule (100 mg total) by mouth 2 (two) times daily. 60 capsule 0  . cyclobenzaprine (FLEXERIL) 10 MG tablet Take 1 tablet (10 mg total) by mouth 3 (three) times daily  as needed for muscle spasms. 60 tablet 0  . diazepam (VALIUM) 10 MG tablet Take 1 tablet (10 mg total) by mouth daily. Give daily with supper 30 tablet 0  . gabapentin (NEURONTIN) 300 MG capsule Take 1 capsule (300 mg total) by mouth 3 (three) times daily. 90 capsule 0  . hydrALAZINE (APRESOLINE) 25 MG tablet Take 1 tablet (25 mg total) by mouth every 8 (eight) hours. 90 tablet 0  . insulin aspart (NOVOLOG) 100 UNIT/ML injection Inject 7 Units into the skin 3 (three) times daily with meals. 7 mL 0  . insulin glargine (LANTUS) 100 UNIT/ML injection Inject 0.39 mLs (39 Units total) into the skin at bedtime. 12 mL 0  . lisinopril (PRINIVIL,ZESTRIL) 20 MG tablet Take 1 tablet (20 mg total) by mouth daily. 30 tablet 0  . lurasidone (LATUDA) 80 MG TABS tablet Take 1 tablet (80 mg total) by mouth daily with supper. 30 tablet 0  . meloxicam (MOBIC) 7.5 MG tablet Take 7.5 mg by mouth daily.    . metFORMIN (GLUCOPHAGE) 1000 MG tablet Take 1 tablet (1,000 mg total) by mouth 2 (two) times daily with a meal. 60 tablet 0  . montelukast (SINGULAIR) 10 MG tablet Take 1 tablet (10 mg total) by mouth at bedtime. For Asthma 30 tablet 0  . simvastatin (ZOCOR) 40 MG tablet Take 1 tablet (40 mg total) by mouth at bedtime. For high cholesterol 15 tablet 0  . venlafaxine XR (EFFEXOR-XR) 150 MG 24 hr capsule Take 1 capsule  (150 mg total) by mouth daily with breakfast. 30 capsule 0    Musculoskeletal: Strength & Muscle Tone: within normal limits Gait & Station: normal Patient leans: [name]  Psychiatric Specialty Exam: Physical Exam  Nursing note and vitals reviewed. Constitutional: He appears well-developed and well-nourished.  HENT:  Head: Normocephalic and atraumatic.  Eyes: Conjunctivae are normal. Pupils are equal, round, and reactive to light.  Neck: Normal range of motion.  Cardiovascular: Regular rhythm and normal heart sounds.   Respiratory: Effort normal. No respiratory distress.  GI: Soft.  Musculoskeletal: Normal range of motion.  Neurological: He is alert.  Skin: Skin is warm and dry.  Psychiatric: His affect is blunt. His speech is delayed. He is slowed and withdrawn. He expresses impulsivity. He expresses homicidal ideation. He expresses no suicidal ideation. He exhibits abnormal recent memory.    Review of Systems  Constitutional: Negative.   HENT: Negative.   Eyes: Negative.   Respiratory: Negative.   Cardiovascular: Negative.   Gastrointestinal: Negative.   Musculoskeletal: Negative.   Skin: Negative.   Neurological: Negative.   Psychiatric/Behavioral: Positive for depression. Negative for hallucinations, memory loss, substance abuse and suicidal ideas. The patient is nervous/anxious and has insomnia.     Blood pressure 118/85, pulse 92, temperature 99.1 F (37.3 C), temperature source Oral, resp. rate 20, height 5\' 9"  (1.753 m), weight 77.1 kg (170 lb), SpO2 96 %.Body mass index is 25.1 kg/m.  General Appearance: Disheveled  Eye Contact:  Fair  Speech:  Slow  Volume:  Decreased  Mood:  Depressed and Irritable  Affect:  Constricted  Thought Process:  Goal Directed  Orientation:  Full (Time, Place, and Person)  Thought Content:  Illogical and Paranoid Ideation  Suicidal Thoughts:  No  Homicidal Thoughts:  Yes.  without intent/plan  Memory:  Immediate;   Good Recent;    Fair Remote;   Fair  Judgement:  Impaired  Insight:  Shallow  Psychomotor Activity:  Decreased  Concentration:  Concentration: Fair  Recall:  Jennelle Human of Knowledge:  Fair  Language:  Fair  Akathisia:  No  Handed:  Right  AIMS (if indicated):     Assets:  Desire for Improvement Housing Resilience  ADL's:  Impaired  Cognition:  Impaired,  Mild  Sleep:        Treatment Plan Summary: Daily contact with patient to assess and evaluate symptoms and progress in treatment, Medication management and Plan 41 year old man who appears to be failing to function at his group home. Reporting feelings of rage and homicidal ideation. Still seems to be very sluggish and withdrawn. Patient will be admitted back to the psychiatric ward. Continue IVC. Continue current outpatient medication recheck labs. Case reviewed with TTS and emergency room physician.  Disposition: Recommend psychiatric Inpatient admission when medically cleared. Supportive therapy provided about ongoing stressors.  Mordecai Rasmussen, MD 10/08/2016 4:32 PM

## 2016-10-08 NOTE — ED Notes (Signed)
BEHAVIORAL HEALTH ROUNDING Patient sleeping: No. Patient alert and oriented: yes Behavior appropriate: Yes.  ; If no, describe:  Nutrition and fluids offered: yes Toileting and hygiene offered: Yes  Sitter present: q15 minute observations and security  monitoring Law enforcement present: Yes  ODS  

## 2016-10-08 NOTE — ED Notes (Signed)
Pt. States he got upset with a staff member at group home.  Pt. States he was banging on door with his fists.

## 2016-10-08 NOTE — ED Notes (Signed)
Report to include Situation, Background, Assessment, and Recommendations received from Amy RN. Patient alert and oriented, warm and dry, in no acute distress. Patient denies SI, HI, AVH and pain. Patient made aware of Q15 minute rounds and security cameras for their safety. Patient instructed to come to me with needs or concerns.  

## 2016-10-08 NOTE — ED Notes (Signed)
Dinner brought to patient 

## 2016-10-08 NOTE — ED Notes (Signed)
Pt transferred to [facility] from ED in wine colored scrubs. Pt wanded and oriented to unit. Pt currently denies SI/HI and A/V hallucinations.Pt remains safe with 15 minute checks

## 2016-10-08 NOTE — Progress Notes (Signed)
Inpatient Diabetes Program Recommendations  AACE/ADA: New Consensus Statement on Inpatient Glycemic Control (2015)  Target Ranges:  Prepandial:   less than 140 mg/dL      Peak postprandial:   less than 180 mg/dL (1-2 hours)      Critically ill patients:  140 - 180 mg/dL   Lab Results  Component Value Date   GLUCAP 364 (H) 10/08/2016   HGBA1C 11.2 (H) 09/24/2016    Review of Glycemic Control   Results for Tony Cunningham, Tony Cunningham (MRN [id number]) as of 10/08/2016 10:32  Ref. Range 10/07/2016 17:26 10/08/2016 09:43  Glucose-Capillary Latest Ref Range: 65 - 99 mg/dL 086 (H) 578 (H)    Diabetes history:Type 2 Outpatient Diabetes medications: Lantus 39 units qhs, Metformin 1000mg  bid, Novolog 7 units tid  Current orders for Inpatient glycemic control: Lantus 39 units qhs, Metformin 1000mg  bid, Novolog 7 units tid  Inpatient Diabetes Program Recommendations: Please add Novolog 0-15 units tid, Novolog 0-5 units qhs  Please change diet to carb modified.  Please refrain from giving patient juice- diet drinks only.   Since the patient was in the ED last night, it appears he missed his Lantus insulin which is now contributing to the elevated CBG- consider giving earlier than hs today.    , RN, BA, MHA, CDE Diabetes Coordinator Inpatient Diabetes Program  [phone] (Team Pager) [phone] [facility] Office) 10/08/2016 10:34 AM ,

## 2016-10-08 NOTE — ED Notes (Signed)
Hourly rounding reveals patient in room. No complaints, stable, in no acute distress. Q15 minute rounds and monitoring via Security Cameras to continue. 

## 2016-10-08 NOTE — ED Notes (Signed)
Contacted group home of need to come pick up pt for d/c. Spoke with Leonette Most and he said he could not come or provide transportation but gave me owner's Misty Stanley) [phone]. Misty Stanley contacted and called back to say she would provide transportation

## 2016-10-08 NOTE — ED Notes (Signed)
Called BHU to provide report for pt to move to [facility] per MD order   I was told that I would need to wait for a call back

## 2016-10-08 NOTE — ED Notes (Signed)
Pt. Requested something to drink, pt. Given grape juice upon request.

## 2016-10-08 NOTE — ED Notes (Addendum)
ED  Is the patient under IVC or is there intent for IVC:  voluntary Is the patient medically cleared: Yes.   Is there vacancy in the ED BHU: Yes.   Is the population mix appropriate for patient: Yes.   Is the patient awaiting placement in inpatient or outpatient setting:   Has the patient had a psychiatric consult:  Consult pending  Survey of unit performed for contraband, proper placement and condition of furniture, tampering with fixtures in bathroom, shower, and each patient room: Yes.  ; Findings:  APPEARANCE/BEHAVIOR Calm and cooperative NEURO ASSESSMENT Orientation: oriented x 4 Denies pain Hallucinations: No.None noted (Hallucinations) Speech: Normal Gait: normal RESPIRATORY ASSESSMENT Even  Unlabored respirations  CARDIOVASCULAR ASSESSMENT Pulses equal   regular rate  Skin warm and dry   GASTROINTESTINAL ASSESSMENT no GI complaint EXTREMITIES Full ROM  PLAN OF CARE Provide calm/safe environment. Vital signs assessed twice daily. ED BHU Assessment once each 12-hour shift. Collaborate with intake RN daily or as condition indicates. Assure the ED provider has rounded once each shift. Provide and encourage hygiene. Provide redirection as needed. Assess for escalating behavior; address immediately and inform ED provider.  Assess family dynamic and appropriateness for visitation as needed: Yes.  ; If necessary, describe findings:  Educate the patient/family about BHU procedures/visitation: Yes.  ; If necessary, describe findings:

## 2016-10-08 NOTE — ED Notes (Signed)
Pt approached nursing station and reported that he "had an accident" (feces) in his pants. Pt given new scrubs, underwear, socks, towels and toiletries for shower. Pt taking shower at this time.

## 2016-10-08 NOTE — ED Notes (Signed)
Pt. Changed into burgundy scrubs upon arrival to 20 H bed.  Pt. Had one shirt, one pair of jeans, pair of shoes and one pair of underwear put in one bag.

## 2016-10-08 NOTE — ED Notes (Signed)
Pt observed with no unusual behavior  Appropriate to stimulation  No verbalized needs or concerns at this time  NAD assessed  Continue to monitor 

## 2016-10-08 NOTE — BH Assessment (Signed)
Patient presenting from [facility] with concerns of aggressive behavior towards staff. Pt denies SI and any auditory/visual hallucinations.  Pt is upset because the group home staff did not fill his prescription for pain.  Pt was seen and d/c from the ED yesterday for the same.  Pt is voluntary.  Pt does not meet criteria for inpatient hospitalization.

## 2016-10-09 DIAGNOSIS — F251 Schizoaffective disorder, depressive type: Principal | ICD-10-CM

## 2016-10-09 LAB — CBC WITH DIFFERENTIAL/PLATELET
BASOS ABS: 0.1 10*3/uL (ref 0–0.1)
BASOS PCT: 1 %
EOS ABS: 0.2 10*3/uL (ref 0–0.7)
Eosinophils Relative: 4 %
HCT: 45 % (ref 40.0–52.0)
Hemoglobin: 15.9 g/dL (ref 13.0–18.0)
Lymphocytes Relative: 37 %
Lymphs Abs: 2.3 10*3/uL (ref 1.0–3.6)
MCH: 32 pg (ref 26.0–34.0)
MCHC: 35.3 g/dL (ref 32.0–36.0)
MCV: 90.5 fL (ref 80.0–100.0)
MONO ABS: 0.3 10*3/uL (ref 0.2–1.0)
Monocytes Relative: 5 %
Neutro Abs: 3.3 10*3/uL (ref 1.4–6.5)
Neutrophils Relative %: 53 %
Platelets: 238 10*3/uL (ref 150–440)
RBC: 4.98 MIL/uL (ref 4.40–5.90)
RDW: 13.1 % (ref 11.5–14.5)
WBC: 6.1 10*3/uL (ref 3.8–10.6)

## 2016-10-09 LAB — GLUCOSE, CAPILLARY
GLUCOSE-CAPILLARY: 234 mg/dL — AB (ref 65–99)
GLUCOSE-CAPILLARY: 271 mg/dL — AB (ref 65–99)
Glucose-Capillary: 323 mg/dL — ABNORMAL HIGH (ref 65–99)
Glucose-Capillary: 185 mg/dL — ABNORMAL HIGH (ref 65–99)
Glucose-Capillary: 167 mg/dL — ABNORMAL HIGH (ref 65–99)

## 2016-10-09 LAB — LIPID PANEL
Cholesterol: 127 mg/dL (ref 0–200)
HDL: 39 mg/dL — ABNORMAL LOW (ref >40)
LDL CALC: 72 mg/dL (ref 0–99)
Total CHOL/HDL Ratio: 3.3 RATIO
Triglycerides: 80 mg/dL (ref <150)
VLDL: 16 mg/dL (ref 0–40)

## 2016-10-09 LAB — TSH: TSH: 1.474 u[IU]/mL (ref 0.350–4.500)

## 2016-10-09 LAB — TROPONIN I

## 2016-10-09 MED ORDER — CLOZAPINE 25 MG PO TABS
50.0000 mg | ORAL_TABLET | Freq: Every day | ORAL | Status: DC
  Administered 2016-10-09 – 2016-10-10 (×2): 50 mg via ORAL
  Filled 2016-10-09 (×2): qty 2

## 2016-10-09 MED ORDER — VENLAFAXINE HCL ER 75 MG PO CP24
75.0000 mg | ORAL_CAPSULE | Freq: Every day | ORAL | Status: DC
  Administered 2016-10-10 – 2016-10-12 (×3): 75 mg via ORAL
  Filled 2016-10-09 (×3): qty 1

## 2016-10-09 MED ORDER — NICOTINE 21 MG/24HR TD PT24
21.0000 mg | MEDICATED_PATCH | Freq: Every day | TRANSDERMAL | Status: DC
  Administered 2016-10-09 – 2016-10-19 (×11): 21 mg via TRANSDERMAL
  Filled 2016-10-09 (×12): qty 1

## 2016-10-09 MED ORDER — INSULIN ASPART 100 UNIT/ML ~~LOC~~ SOLN
0.0000 [IU] | Freq: Every day | SUBCUTANEOUS | Status: DC
  Administered 2016-10-10: 4 [IU] via SUBCUTANEOUS
  Administered 2016-10-12 – 2016-10-13 (×2): 3 [IU] via SUBCUTANEOUS
  Administered 2016-10-13: 5 [IU] via SUBCUTANEOUS
  Administered 2016-10-15: 3 [IU] via SUBCUTANEOUS
  Administered 2016-10-16 – 2016-10-17 (×2): 4 [IU] via SUBCUTANEOUS
  Administered 2016-10-18: 2 [IU] via SUBCUTANEOUS
  Administered 2016-10-19: 5 [IU] via SUBCUTANEOUS
  Filled 2016-10-09: qty 15
  Filled 2016-10-09: qty 8
  Filled 2016-10-09 (×3): qty 4

## 2016-10-09 MED ORDER — FLUVOXAMINE MALEATE 50 MG PO TABS
50.0000 mg | ORAL_TABLET | Freq: Every day | ORAL | Status: DC
  Administered 2016-10-09 – 2016-10-11 (×3): 50 mg via ORAL
  Filled 2016-10-09 (×3): qty 1

## 2016-10-09 MED ORDER — LIDOCAINE 5 % EX PTCH
1.0000 | MEDICATED_PATCH | CUTANEOUS | Status: DC
  Administered 2016-10-09 – 2016-10-20 (×12): 1 via TRANSDERMAL
  Filled 2016-10-09 (×12): qty 1

## 2016-10-09 MED ORDER — INSULIN ASPART 100 UNIT/ML ~~LOC~~ SOLN
0.0000 [IU] | Freq: Three times a day (TID) | SUBCUTANEOUS | Status: DC
  Administered 2016-10-09: 8 [IU] via SUBCUTANEOUS
  Administered 2016-10-09 – 2016-10-10 (×3): 3 [IU] via SUBCUTANEOUS
  Administered 2016-10-11: 11 [IU] via SUBCUTANEOUS
  Administered 2016-10-12: 8 [IU] via SUBCUTANEOUS
  Administered 2016-10-12: 3 [IU] via SUBCUTANEOUS
  Administered 2016-10-13: 2 [IU] via SUBCUTANEOUS
  Administered 2016-10-13: 15 [IU] via SUBCUTANEOUS
  Administered 2016-10-13: 5 [IU] via SUBCUTANEOUS
  Administered 2016-10-14: 3 [IU] via SUBCUTANEOUS
  Administered 2016-10-15: 15 [IU] via SUBCUTANEOUS
  Administered 2016-10-15: 3 [IU] via SUBCUTANEOUS
  Administered 2016-10-16: 2 [IU] via SUBCUTANEOUS
  Administered 2016-10-16: 3 [IU] via SUBCUTANEOUS
  Administered 2016-10-16: 8 [IU] via SUBCUTANEOUS
  Administered 2016-10-17: 11 [IU] via SUBCUTANEOUS
  Administered 2016-10-17: 8 [IU] via SUBCUTANEOUS
  Administered 2016-10-17 – 2016-10-18 (×3): 5 [IU] via SUBCUTANEOUS
  Administered 2016-10-19: 8 [IU] via SUBCUTANEOUS
  Administered 2016-10-19: 11 [IU] via SUBCUTANEOUS
  Administered 2016-10-20: 3 [IU] via SUBCUTANEOUS
  Administered 2016-10-20: 8 [IU] via SUBCUTANEOUS
  Administered 2016-10-20: 3 [IU] via SUBCUTANEOUS
  Filled 2016-10-09: qty 3
  Filled 2016-10-09: qty 11
  Filled 2016-10-09: qty 8
  Filled 2016-10-09: qty 11
  Filled 2016-10-09: qty 5
  Filled 2016-10-09 (×3): qty 3
  Filled 2016-10-09: qty 5
  Filled 2016-10-09 (×2): qty 8

## 2016-10-09 NOTE — Progress Notes (Signed)
Spoke with Tawanna Cooler in PT and she states that if someone doesn't make it down here this afternoon, they will come in the morning.

## 2016-10-09 NOTE — Plan of Care (Signed)
Problem: Education: Goal: Verbalization of understanding the information provided will improve Outcome: Progressing Treatment process explained: patient verbalized understanding

## 2016-10-09 NOTE — Plan of Care (Signed)
Problem: Education: Goal: Emotional status will improve Outcome: Progressing Patient was educated about group activities planned on the unit. Verbalized understanding and willing to participate

## 2016-10-09 NOTE — Progress Notes (Signed)
Patient newly admitted. Currently in bed sleeping. No sign of discomfort while asleep. Safety precautions maintained.

## 2016-10-09 NOTE — Plan of Care (Signed)
Problem: Coping: Goal: Ability to verbalize frustrations and anger appropriately will improve Outcome: Progressing Patient reported that he is is here to "work on my behavior, my anger.Marland Kitchenibuprofen get angry easily"

## 2016-10-09 NOTE — Progress Notes (Signed)
Pharmacy Consult for Clozapine Monitoring:  41 yo M to start a trial of Clozapine by Dr. Huntley Dec.   PER MD note 10/09/16- Schizoaffective d/o depressed type: multiple failures to antipsychotics. Pt frequently hospitalized.  Will start a trial of clozaril. Start 50 mg qhs.  For depressive Symptoms and we will start Luvox 50 mg daily at bedtime. Luvox will also increase the levels of Clozaril. By doing these we will need to lower doses of Clozaril for therapeutic effect. How will start tapering off Effexor. I will decrease his dose today to 75 mg Continued Latuda 80 mg for now however I plan to cross taper this medication with Clozaril. Likely [facility] will be discontinued prior to discharge   5/18  ANC= 3.3 prior to beginning Clozaril  This information was registered in the clozapine [facility] program. Next labs on 10/16/16   Bari Mantis PharmD Clinical Pharmacist 10/09/2016

## 2016-10-09 NOTE — Progress Notes (Signed)
Recreation Therapy Notes  Date: 05.18.18 Time: 9:30 am Location: Craft Room  Group Topic: Coping Skills  Goal Area(s) Addresses:  Patient will identify healthy coping skills. Patient will verbalize benefit of using healthy coping skills.  Behavioral Response: Did not attend   Intervention: Coping Skills Alphabet  Activity: Patients were given a Coping Skills Alphabet worksheet and were instructed to write healthy coping skills for each letter of the alphabet.  Education: LRT educated patients on healthy coping skills.  Education Outcome: Patient did not attend group.  Clinical Observations/Feedback: Patient did not attend group.  Jacquelynn Cree, LRT/CTRS 10/09/2016 10:11 AM

## 2016-10-09 NOTE — [facility] Suicide Risk Assessment (Addendum)
[facility] INPATIENT:  Family/Significant Other Suicide Prevention Education  Suicide Prevention Education:  Contact Attempts: [name], mother, [phone], has been identified by the patient as the family member/significant other with whom the patient will be residing, and identified as the person(s) who will aid the patient in the event of a mental health crisis.  With written consent from the patient, two attempts were made to provide suicide prevention education, prior to and/or following the patient's discharge.  We were unsuccessful in providing suicide prevention education.  A suicide education pamphlet was given to the patient to share with family/significant other.  Date and time of first attempt:10/09/16, 1550 Date and time of second attempt:10/12/16, 1125  [name], LCSW 10/09/2016, 3:51 PM

## 2016-10-09 NOTE — Progress Notes (Signed)
Initial Nutrition Assessment  DOCUMENTATION CODES:   Not applicable  INTERVENTION:   Diabetic Snacks  NUTRITION DIAGNOSIS:   Unintentional weight loss related to other (see comment) (diabetes) as evidenced by 13 percent weight loss in 8 months.  GOAL:   Patient will meet greater than or equal to 90% of their needs  MONITOR:   PO intake, Labs, Weight trends  REASON FOR ASSESSMENT:   Consult Assessment of nutrition requirement/status  ASSESSMENT:   41 year old man was brought from his group home last night with reports that he was angry hostile and agitated.    RD received consult to order diabetic snacks for this pt. RD discussed with RN appropriate snacks for this pt. Apparently, pt is able to get snacks of his choosing at 10am and 8pm snack times. RD recommend to RN to only allow pt to get diabetic friendly snacks. Pt sleeping at time of visit. RN suggested that RD not speak with pt today as pt is withdrawn and not talking to staff. RD will order diabetic friendly snacks. Per chart, pt has lost 24lbs(13%) in 8 months; this is significant given history. Pt currently eating 80-100% meals.   Medications reviewed and include: insulin, meloxicam, metformin, nicotine  Labs reviewed: Na 130(L), Cl 93(L) Blood glucose- 534 AIC 11.2(H)- 5/3  Diet Order:  Diet Carb Modified Fluid consistency: Thin; Room service appropriate? Yes  Skin:  Reviewed, no issues  Last BM:  none since admit  Height:   Ht Readings from Last 1 Encounters:  10/08/16 5\' 9"  (1.753 m)    Weight:   Wt Readings from Last 1 Encounters:  10/08/16 163 lb (73.9 kg)    Ideal Body Weight:  72.7 kg  BMI:  Body mass index is 24.07 kg/m.  Estimated Nutritional Needs:   Kcal:  1850-2150kcal/day   Protein:  74-89g/day   Fluid:  >1.8L/day   EDUCATION NEEDS:   Education needs no appropriate at this time  10/10/16 MS, RD, LDN Pager #- [phone]

## 2016-10-09 NOTE — Progress Notes (Signed)
Recreation Therapy Notes  At approximately 3:25 pm, LRT attempted assessment. Patient requested he would rather sleep and wanted LRT to come back later.  Jacquelynn Cree, LRT/CTRS 10/09/2016 3:58 PM

## 2016-10-09 NOTE — Evaluation (Signed)
Physical Therapy Evaluation Patient Details Name: Tony Cunningham MRN: [id number] DOB: 27-Sep-1975 Today's Date: 10/09/2016   History of Present Illness  41 y/o male here from group home after feelings of depression, multiple ER/admit visits.  Pt reports 2 weeks of neck pain (after falling out of bed?)  Clinical Impression  Difficult to fully assess pt as he was reserve and needed heavy provocation to gather minimal information.  He did take well to trying exercises/stretches after PT demonstrated and cues.  We spent ~10 minutes doing multiple reps of each stretch bilaterally with 15+ second holds. Instructed pt to do these 2-4x/day as he is able.  Pt reported good understanding, pt likely does not require further PT intervention.     Follow Up Recommendations No PT follow up    Equipment Recommendations       Recommendations for Other Services       Precautions / Restrictions Precautions Precautions: None Restrictions Weight Bearing Restrictions: No      Mobility  Bed Mobility Overal bed mobility: Independent             General bed mobility comments: no issues with mobility in/out of bed  Transfers Overall transfer level: Independent Equipment used: None             General transfer comment: Good confidence and balance getting to standing  Ambulation/Gait Ambulation/Gait assistance: Independent Ambulation Distance (Feet): 40 Feet Assistive device: None       General Gait Details: Pt has been walking all around the unit w/o issues, no safety concerns with ambulation  Stairs            Wheelchair Mobility    Modified Rankin (Stroke Patients Only)       Balance Overall balance assessment: Independent                                           Pertinent Vitals/Pain Pain Assessment: 0-10 Pain Score: 7  Pain Location: pt struggles to report, at times states it's more central, at times more laterally.  Reports clavicular pain, then  rhomboid, very scattered/inconsistent reports of pain.    Home Living Family/patient expects to be discharged to:: Private residence (hopes to go to [employer] home) Living Arrangements: Group Home             Home Equipment: None      Prior Function Level of Independence: Independent         Comments: Group home resident, does not like it.       Hand Dominance        Extremity/Trunk Assessment   Upper Extremity Assessment Upper Extremity Assessment: Generalized weakness;Overall [facility] for tasks assessed    Lower Extremity Assessment Lower Extremity Assessment: Overall WFL for tasks assessed;Generalized weakness    Cervical / Trunk Assessment Cervical / Trunk Assessment: Other exceptions Cervical / Trunk Exceptions: with with ~50 degrees of R rotation, ~60 L, limited side bending bilaterally with tight UTs, pain with flex and more so with extension  Communication   Communication: Expressive difficulties (pt needed heavy cuing to try getting basic history, etc)  Cognition Arousal/Alertness: Awake/alert Behavior During Therapy: Agitated;Anxious Overall Cognitive Status: Difficult to assess                                 General Comments: Pt  very difficult to pry much information out of, inconsistent reports of pain/limitations/etc      General Comments      Exercises Other Exercises Other Exercises: b/l UT stretches, levator scap stretches and corner push-up scap retraction streches.  Instructed pt and performed multiple bouts of each with 15+ second holds.  Pt able to perform with minimal supervision and reports good understanding.    Assessment/Plan    PT Assessment Patent does not need any further PT services  PT Problem List         PT Treatment Interventions      PT Goals (Current goals can be found in the Care Plan section)  Acute Rehab PT Goals Patient Stated Goal: Get rid of his neck pain PT Goal Formulation: All assessment and  education complete, DC therapy    Frequency     Barriers to discharge        Co-evaluation               AM-PAC PT "6 Clicks" Daily Activity  Outcome Measure Difficulty turning over in bed (including adjusting bedclothes, sheets and blankets)?: None Difficulty moving from lying on back to sitting on the side of the bed? : None Difficulty sitting down on and standing up from a chair with arms (e.g., wheelchair, bedside commode, etc,.)?: None Help needed moving to and from a bed to chair (including a wheelchair)?: None Help needed walking in hospital room?: None Help needed climbing 3-5 steps with a railing? : None 6 Click Score: 24    End of Session   Activity Tolerance: Patient limited by pain Patient left: in bed   PT Visit Diagnosis: Pain Pain - Right/Left: Right Pain - part of body: Shoulder    Time: 0102-7253 PT Time Calculation (min) (ACUTE ONLY): 27 min   Charges:   PT Evaluation $PT Eval Low Complexity: 1 Procedure PT Treatments $Therapeutic Exercise: 8-22 mins   PT G Codes:   PT G-Codes **NOT FOR INPATIENT CLASS** Functional Assessment Tool Used: AM-PAC 6 Clicks Basic Mobility Functional Limitation: Mobility: Walking and moving around Mobility: Walking and Moving Around Current Status (G6440): 0 percent impaired, limited or restricted Mobility: Walking and Moving Around Goal Status (H4742): 0 percent impaired, limited or restricted Mobility: Walking and Moving Around Discharge Status (V9563): 0 percent impaired, limited or restricted    Malachi Pro, DPT 10/09/2016, 4:46 PM

## 2016-10-09 NOTE — [facility] Counselor (Signed)
Adult Comprehensive Assessment  Patient ID: Tony Cunningham, male   DOB: December 02, 1975, 41 y.o.   MRN: [id number]  Information Source: Information source: Patient (PT reports his anxiety has been quite bad since discharge)  Current Stressors:  Physical health (include injuries & life threatening diseases): Pt says he worries about his health a lot.  Living/Environment/Situation:  Living Arrangements: Group Home Living conditions (as described by patient or guardian): Pt reports he was very anxious at group home, better at [facility] day program. How long has patient lived in current situation?: 3 months  Family History:  Marital status: Single Are you sexually active?: Yes What is your sexual orientation?: heterosexual Has your sexual activity been affected by drugs, alcohol, medication, or emotional stress?: none Does patient have children?: No  Childhood History:  By whom was/is the patient raised?: Both parents Additional childhood history information: Pt reports having a good childhood.  No issues reported.  Description of patient's relationship with caregiver when they were a child: Pt reports being closer to his father.  Patient's description of current relationship with people who raised him/her: Pt sees his parents weekly.  Pt reports he gets along with his parents. How were you disciplined when you got in trouble as a child/adolescent?: appropriate discipline Does patient have siblings?: Yes Description of patient's current relationship with siblings: 2 brothers, 1 sister - Pt reports being close to 1 brother and his sister.   Did patient suffer any verbal/emotional/physical/sexual abuse as a child?: No Did patient suffer from severe childhood neglect?: No Has patient ever been sexually abused/assaulted/raped as an adolescent or adult?: No Was the patient ever a victim of a crime or a disaster?: No Witnessed domestic violence?: No Has patient been effected by domestic violence as an  adult?: No  Education:  Highest grade of school patient has completed: 11 Currently a student?: No  Employment/Work Situation:   Employment situation: On disability Why is patient on disability: mental health How long has patient been on disability: "all my life" Patient's job has been impacted by current illness: No What is the longest time patient has a held a job?: [employer] Where was the patient employed at that time?: 8-11 years Has patient ever been in the [employer]?: No Are There Guns or Other Weapons in Your Home?: No  Financial Resources:   Surveyor, quantity resources: Writer Does patient have a Lawyer or guardian?: No  Alcohol/Substance Abuse:   What has been your use of drugs/alcohol within the last 12 months?: PT denies all drug or alcohol use If attempted suicide, did drugs/alcohol play a role in this?: No Alcohol/Substance Abuse Treatment Hx: Denies past history Has alcohol/substance abuse ever caused legal problems?: No  Social Support System:   Patient's Community Support System: Good Describe Community Support System: mom Type of faith/religion: Scientist, research (life sciences):   Leisure and Hobbies: video games  Strengths/Needs:   What things does the patient do well?: Pt reports going to [facility] day program and going to church were positive. In what areas does patient struggle / problems for patient: anxiety   Discharge Plan:   Does patient have access to transportation?: Yes Will patient be returning to same living situation after discharge?: Yes Currently receiving community mental health services: Yes (From Whom) ([facility]) Does patient have financial barriers related to discharge medications?: No  Summary/Recommendations:   Summary and Recommendations (to be completed by the evaluator): Pt is 41 year old male from [facility].  Pt is diagnosed with schizoaffective disorder  and was admitted due to psychosis after multiple recent ED visits.   Recommendations for pt include crisis stabilization, therapeutic milieu, attend and participate in group, medication management, and development of comprehensive mental wellness plan.  Pt will return to outpatient services upon discharge at [facility].  [name]. 10/09/2016

## 2016-10-09 NOTE — Tx Team (Signed)
Initial Treatment Plan 10/09/2016 3:07 AM Tony Cunningham ZGY:[id number]    PATIENT STRESSORS: Health problems Loss of support system   PATIENT STRENGTHS: Ability for insight Average or above average intelligence Communication skills Motivation for treatment/growth   PATIENT IDENTIFIED PROBLEMS: Depression  anxiety  Irritability                 DISCHARGE CRITERIA:  Ability to meet basic life and health needs Adequate post-discharge living arrangements Improved stabilization in mood, thinking, and/or behavior Motivation to continue treatment in a less acute level of care Safe-care adequate arrangements made  PRELIMINARY DISCHARGE PLAN: Outpatient therapy Participate in family therapy Placement in alternative living arrangements  PATIENT/FAMILY INVOLVEMENT: This treatment plan has been presented to and reviewed with the patient, Tony Cunningham.The patient has been given the opportunity to ask questions and make suggestions.  Olin Pia, RN 10/09/2016, 3:07 AM

## 2016-10-09 NOTE — Progress Notes (Signed)
D: Patient is compliant with his medications.  He is quiet and guarded with staff.  Patient was given a lidocaine patch for his neck pain and he states, "it feels good."  Patient has delayed responses when questions are asked.  He appears irritable and paranoid.  Patient shook his head "no" when asked if he had thoughts of self harm.  A diabetic consult was ordered and completed.  His CBGs are running lower with the sliding scale.  Patient remains isolative to room and has not been attending groups.  He is attending meals in the craft room.  Patient wants to go back to live with his mother upon discharge.  He does not want to return to the group home. A: Continue to monitor medication management and MD orders.  Safety checks completed every 15 minutes per protocol.  Offer support and encouragement as needed. R: Patient is isolative and withdrawn; he is guarded and cautious with staff.

## 2016-10-09 NOTE — [facility] Group Notes (Signed)
[facility] LCSW Group Therapy  10/09/2016 3:18 PM  Type of Therapy:  Group Therapy  Participation Level:  Patient did not attend group. CSW invited patient to group.   Summary of Progress/Problems: Stress management: Patients defined and discussed the topic of stress and the related symptoms and triggers for stress. Patients identified healthy coping skills they would like to try during hospitalization and after discharge to manage stress in a healthy way. CSW offered insight to varying stress management techniques.   Tony Cunningham G. Garnette Czech MSW, LCSWA 10/09/2016, 3:18 PM

## 2016-10-09 NOTE — Progress Notes (Signed)
Patient ID: Tony Cunningham, male   DOB: May 22, 1976, 41 y.o.   MRN: [id number] Patient presents voluntarily from the group home where he resides. Patient's caregivers complained that pt has been displaying aggressive behavior toward staff, irritable and not willing to follow directions. Denying suicidal thoughts but reports that he has been feeling depressed. Denies hallucinations: alert and oriented. Patient's was assessed, admitted and oriented to the unit. Skin assessment completed by this nurse, assisted by Tresa Endo, RN. No skin impairments noted. Patient was directed to his room and safety precautions initiated. Will be evaluated in AM.

## 2016-10-09 NOTE — Tx Team (Signed)
Interdisciplinary Treatment and Diagnostic Plan Update  10/09/2016 Time of Session: [place] MRN: [id number]  Principal Diagnosis: Schizoaffective disorder, depressive type ([place])  Secondary Diagnoses: Principal Problem:   Schizoaffective disorder, depressive type ([place]) Active Problems:   Diabetes ([place])   HTN (hypertension)   Tobacco use disorder   Dyslipidemia   Asthma   Tardive dyskinesia   Polysubstance abuse   Current Medications:  Current Facility-Administered Medications  Medication Dose Route Frequency Provider Last Rate Last Dose  . acetaminophen (TYLENOL) tablet 650 mg  650 mg Oral Q6H PRN [name], [name], MD      . alum & mag hydroxide-simeth (MAALOX/MYLANTA) 200-200-20 MG/5ML suspension 30 mL  30 mL Oral Q4H PRN [name], [name], MD      . cloZAPine (CLOZARIL) tablet 50 mg  50 mg Oral QHS Hildred Priest, MD      . diazepam (VALIUM) tablet 10 mg  10 mg Oral Daily [name], Madie Reno, MD   10 mg at 10/09/16 0752  . fluvoxaMINE (LUVOX) tablet 50 mg  50 mg Oral QHS Hildred Priest, MD      . gabapentin (NEURONTIN) capsule 300 mg  300 mg Oral TID [name], [name], MD   300 mg at 10/09/16 1119  . hydrALAZINE (APRESOLINE) tablet 25 mg  25 mg Oral Q8H [name], [name], MD      . insulin aspart (novoLOG) injection 0-15 Units  0-15 Units Subcutaneous TID WC Hildred Priest, MD   3 Units at 10/09/16 1145  . insulin aspart (novoLOG) injection 0-5 Units  0-5 Units Subcutaneous QHS Hernandez-Gonzalez, Seth Bake, MD      . insulin aspart (novoLOG) injection 7 Units  7 Units Subcutaneous TID WC [name], Madie Reno, MD   7 Units at 10/09/16 0753  . insulin glargine (LANTUS) injection 39 Units  39 Units Subcutaneous QHS [name], [name], MD      . lidocaine (LIDODERM) 5 % 1 patch  1 patch Transdermal Q24H Hildred Priest, MD   1 patch at 10/09/16 1119  . lisinopril (PRINIVIL,ZESTRIL) tablet 20 mg  20 mg Oral Daily [name], Madie Reno, MD   20 mg at 10/09/16  0752  . lurasidone (LATUDA) tablet 80 mg  80 mg Oral Q supper [name], [name], MD      . magnesium hydroxide (MILK OF MAGNESIA) suspension 30 mL  30 mL Oral Daily PRN [name], [name], MD      . meloxicam (MOBIC) tablet 7.5 mg  7.5 mg Oral Daily [name], [name], MD   7.5 mg at 10/09/16 0752  . metFORMIN (GLUCOPHAGE) tablet 1,000 mg  1,000 mg Oral BID WC [name], Madie Reno, MD   1,000 mg at 10/09/16 0752  . montelukast (SINGULAIR) tablet 10 mg  10 mg Oral QHS [name], [name], MD      . nicotine (NICODERM CQ - dosed in mg/24 hours) patch 21 mg  21 mg Transdermal Daily Hildred Priest, MD   21 mg at 10/09/16 1122  . simvastatin (ZOCOR) tablet 40 mg  40 mg Oral QHS [name], [name], MD      . Derrill Memo ON 10/10/2016] venlafaxine XR (EFFEXOR-XR) 24 hr capsule 75 mg  75 mg Oral Q breakfast Hildred Priest, MD       PTA Medications: Prescriptions Prior to Admission  Medication Sig Dispense Refill Last Dose  . amantadine (SYMMETREL) 100 MG capsule Take 1 capsule (100 mg total) by mouth 2 (two) times daily. 60 capsule 0 10/07/2016 at Unknown time  .  diazepam (VALIUM) 10 MG tablet Take 1 tablet (10 mg total) by mouth daily. Give daily with supper 30 tablet 0 10/07/2016 at Unknown time  . gabapentin (NEURONTIN) 300 MG capsule Take 1 capsule (300 mg total) by mouth 3 (three) times daily. 90 capsule 0 10/07/2016 at Unknown time  . hydrALAZINE (APRESOLINE) 25 MG tablet Take 1 tablet (25 mg total) by mouth every 8 (eight) hours. 90 tablet 0 10/07/2016 at Unknown time  . insulin aspart (NOVOLOG) 100 UNIT/ML injection Inject 7 Units into the skin 3 (three) times daily with meals. 7 mL 0 10/07/2016 at Unknown time  . insulin glargine (LANTUS) 100 UNIT/ML injection Inject 0.39 mLs (39 Units total) into the skin at bedtime. 12 mL 0 10/07/2016 at Unknown time  . lisinopril (PRINIVIL,ZESTRIL) 20 MG tablet Take 1 tablet (20 mg total) by mouth daily. 30 tablet 0 10/07/2016 at Unknown time  . lurasidone (LATUDA) 80  MG TABS tablet Take 1 tablet (80 mg total) by mouth daily with supper. 30 tablet 0 10/07/2016 at Unknown time  . meloxicam (MOBIC) 7.5 MG tablet Take 7.5 mg by mouth daily.   10/07/2016 at Unknown time  . metFORMIN (GLUCOPHAGE) 1000 MG tablet Take 1 tablet (1,000 mg total) by mouth 2 (two) times daily with a meal. 60 tablet 0 10/07/2016 at Unknown time  . montelukast (SINGULAIR) 10 MG tablet Take 1 tablet (10 mg total) by mouth at bedtime. For Asthma 30 tablet 0 10/07/2016 at Unknown time  . simvastatin (ZOCOR) 40 MG tablet Take 1 tablet (40 mg total) by mouth at bedtime. For high cholesterol 15 tablet 0 10/07/2016 at Unknown time  . venlafaxine XR (EFFEXOR-XR) 150 MG 24 hr capsule Take 1 capsule (150 mg total) by mouth daily with breakfast. 30 capsule 0 10/07/2016 at Unknown time  . cyclobenzaprine (FLEXERIL) 10 MG tablet Take 1 tablet (10 mg total) by mouth 3 (three) times daily as needed for muscle spasms. 60 tablet 0 10/07/2016 at Unknown time    Patient Stressors: Health problems Loss of support system  Patient Strengths: Ability for insight Average or above average intelligence Communication skills Motivation for treatment/growth  Treatment Modalities: Medication Management, Group therapy, Case management,  1 to 1 session with clinician, Psychoeducation, Recreational therapy.   Physician Treatment Plan for Primary Diagnosis: Schizoaffective disorder, depressive type ([place]) Long Term Goal(s): Improvement in symptoms so as ready for discharge Improvement in symptoms so as ready for discharge   Short Term Goals: Ability to verbalize feelings will improve Ability to demonstrate self-control will improve Ability to identify and develop effective coping behaviors will improve Ability to identify and develop effective coping behaviors will improve Ability to identify triggers associated with substance abuse/mental health issues will improve  Medication Management: Evaluate patient's response,  side effects, and tolerance of medication regimen.  Therapeutic Interventions: 1 to 1 sessions, Unit Group sessions and Medication administration.  Evaluation of Outcomes: Not Met  Physician Treatment Plan for Secondary Diagnosis: Principal Problem:   Schizoaffective disorder, depressive type ([place]) Active Problems:   Diabetes ([place])   HTN (hypertension)   Tobacco use disorder   Dyslipidemia   Asthma   Tardive dyskinesia   Polysubstance abuse  Long Term Goal(s): Improvement in symptoms so as ready for discharge Improvement in symptoms so as ready for discharge   Short Term Goals: Ability to verbalize feelings will improve Ability to demonstrate self-control will improve Ability to identify and develop effective coping behaviors will improve Ability to identify and develop effective coping behaviors  will improve Ability to identify triggers associated with substance abuse/mental health issues will improve     Medication Management: Evaluate patient's response, side effects, and tolerance of medication regimen.  Therapeutic Interventions: 1 to 1 sessions, Unit Group sessions and Medication administration.  Evaluation of Outcomes: Not Met   RN Treatment Plan for Primary Diagnosis: Schizoaffective disorder, depressive type ([place]) Long Term Goal(s): Knowledge of disease and therapeutic regimen to maintain health will improve  Short Term Goals: Ability to identify and develop effective coping behaviors will improve  Medication Management: RN will administer medications as ordered by provider, will assess and evaluate patient's response and provide education to patient for prescribed medication. RN will report any adverse and/or side effects to prescribing provider.  Therapeutic Interventions: 1 on 1 counseling sessions, Psychoeducation, Medication administration, Evaluate responses to treatment, Monitor vital signs and CBGs as ordered, Perform/monitor CIWA, COWS, AIMS and Fall Risk  screenings as ordered, Perform wound care treatments as ordered.  Evaluation of Outcomes: Not Met   LCSW Treatment Plan for Primary Diagnosis: Schizoaffective disorder, depressive type ([place]) Long Term Goal(s): Safe transition to appropriate next level of care at discharge, Engage patient in therapeutic group addressing interpersonal concerns.  Short Term Goals: Engage patient in aftercare planning with referrals and resources and Increase skills for wellness and recovery  Therapeutic Interventions: Assess for all discharge needs, 1 to 1 time with Social worker, Explore available resources and support systems, Assess for adequacy in community support network, Educate family and significant other(s) on suicide prevention, Complete Psychosocial Assessment, Interpersonal group therapy.  Evaluation of Outcomes: Not Met   Progress in Treatment: Attending groups: No. Participating in groups: No. Taking medication as prescribed: Yes. Toleration medication: Yes. Family/Significant other contact made: No, will contact:  when given permission Patient understands diagnosis: Yes. Discussing patient identified problems/goals with staff: Yes. Medical problems stabilized or resolved: Yes. Denies suicidal/homicidal ideation: Yes. Issues/concerns per patient self-inventory: No. Other: none  New problem(s) identified: No, Describe:  none  New Short Term/Long Term Goal(s): PT unable to articulate a goal today.  Discharge Plan or Barriers: Return to day program at [facility].  Reason for Continuation of Hospitalization: Medication stabilization  Estimated Length of Stay: 5-7 days.  Attendees: Patient: Tony Cunningham 10/09/2016   Physician: Dr. Jerilee Hoh, MD 10/09/2016   Nursing: Polly Cobia, RN 10/09/2016   RN Care Manager: 10/09/2016  Social Worker: Lurline Idol, LCSW 10/09/2016   Recreational Therapist: Everitt Amber, LRT/CTRS  10/09/2016   Other:  10/09/2016   Other:  10/09/2016   Other: 10/09/2016         Scribe for Treatment Team: Joanne Chars, LCSW 10/09/2016 11:45 AM

## 2016-10-09 NOTE — Plan of Care (Signed)
Problem: Safety: Goal: Ability to demonstrate self-control will improve Outcome: Progressing Patient demonstrates self control and has not exhibited any aggressive behavior.

## 2016-10-09 NOTE — [facility] Suicide Risk Assessment (Signed)
[facility] Admission Suicide Risk Assessment   Nursing information obtained from:    Demographic factors:    Current Mental Status:    Loss Factors:    Historical Factors:    Risk Reduction Factors:     Total Time spent with patient:  Principal Problem: Schizoaffective disorder, depressive type (HCC) Diagnosis:   Patient Active Problem List   Diagnosis Date Noted  . Schizoaffective disorder, depressive type (HCC) [F25.1] 09/23/2016  . Polysubstance abuse [F19.10] 07/17/2016  . Tardive dyskinesia [G24.01] 07/16/2016  . Tobacco use disorder [F17.200] 07/07/2016  . Dyslipidemia [E78.5] 07/07/2016  . Asthma [J45.909] 07/07/2016  . HTN (hypertension) [I10] 07/06/2016  . Diabetes (HCC) [E11.9] 12/25/2010   Subjective Data:   Continued Clinical Symptoms:  Alcohol Use Disorder Identification Test Final Score (AUDIT): 0 The "Alcohol Use Disorders Identification Test", Guidelines for Use in Primary Care, Second Edition.  World Science writer [facility]). Score between 0-7:  no or low risk or alcohol related problems. Score between 8-15:  moderate risk of alcohol related problems. Score between 16-19:  high risk of alcohol related problems. Score 20 or above:  warrants further diagnostic evaluation for alcohol dependence and treatment.   CLINICAL FACTORS:   Severe Anxiety and/or Agitation Depression:   Insomnia More than one psychiatric diagnosis Previous Psychiatric Diagnoses and Treatments Medical Diagnoses and Treatments/Surgeries   Musculoskeletal:   Psychiatric Specialty Exam: Physical Exam  ROS  Blood pressure 123/80, pulse 82, temperature 98.7 F (37.1 C), temperature source Oral, resp. rate 19, height 5\' 9"  (1.753 m), weight 73.9 kg (163 lb), SpO2 98 %.Body mass index is 24.07 kg/m.                                                    Sleep:  Number of Hours: 5.5      COGNITIVE FEATURES THAT CONTRIBUTE TO RISK:  Loss of executive function     SUICIDE RISK:   Moderate:  Frequent suicidal ideation with limited intensity, and duration, some specificity in terms of plans, no associated intent, good self-control, limited dysphoria/symptomatology, some risk factors present, and identifiable protective factors, including available and accessible social support.  PLAN OF CARE: admit to [facility]  I certify that inpatient services furnished can reasonably be expected to improve the patient's condition.   [facility], MD 10/09/2016, 9:19 AM

## 2016-10-09 NOTE — Progress Notes (Addendum)
Pharmacy Consult for Clozapine Monitoring:  41 yo M to start a trial of Clozapine by Dr. Huntley Dec. Patient needs to be enrolled in Clozapine REMS program by MD.  Lisette Grinder MD note 10/09/16- Schizoaffective d/o depressed type: multiple failures to antipsychotics. Pt frequently hospitalized.  Will start a trial of clozaril. Start 50 mg qhs.  For depressive Symptoms and we will start Luvox 50 mg daily at bedtime. Luvox will also increase the levels of Clozaril. By doing these we will need to lower doses of Clozaril for therapeutic effect. How will start tapering off Effexor. I will decrease his dose today to 75 mg Continued Latuda 80 mg for now however I plan to cross taper this medication with Clozaril. Likely Kasandra Knudsen will be discontinued prior to discharge   5/18  ANC= 3.3 prior to beginning Clozaril  Patient was not in REMS system when I entered information. Will need to submit to Clozaril REMS once pt is registered.   Bari Mantis PharmD Clinical Pharmacist 10/09/2016

## 2016-10-09 NOTE — Progress Notes (Signed)
Inpatient Diabetes Program Recommendations  AACE/ADA: New Consensus Statement on Inpatient Glycemic Control (2015)  Target Ranges:  Prepandial:   less than 140 mg/dL      Peak postprandial:   less than 180 mg/dL (1-2 hours)      Critically ill patients:  140 - 180 mg/dL   Lab Results  Component Value Date   GLUCAP 234 (H) 10/09/2016   HGBA1C 11.2 (H) 09/24/2016    Review of Glycemic Control  Results for Tony Cunningham, Tony Cunningham (MRN [id number]) as of 10/09/2016 10:37  Ref. Range 10/08/2016 12:31 10/08/2016 18:42 10/08/2016 21:03 10/08/2016 23:17 10/09/2016 06:58  Glucose-Capillary Latest Ref Range: 65 - 99 mg/dL 409 (H) 811 (H) 914 (H) 323 (H) 234 (H)    Diabetes history:Type 2  Outpatient Diabetes medications: Lantus 39 units qhs, Metformin 1000mg  bid, Novolog 7 units tid  Current orders for Inpatient glycemic control: Lantus 39 units qhs, Metformin 1000mg  bid, Novolog 0-15 units tid, Novolog 0-5 units qhs,  Novolog 7 units tid  Inpatient Diabetes Program Recommendations: Noted patient did not receive Novolog correction last night and just prior to admission, patient missed a dose of Lantus (10/07/16)- Now that the Novolog mealtime, Novolog correction and Lantus is ordered, I anticipate the blood sugars will improve.   I saw this patient on his last admission on 10/01/16.   10/09/16, RN, BA, MHA, CDE Diabetes Coordinator Inpatient Diabetes Program  [phone] (Team Pager) [phone] [facility] Office) 10/09/2016 10:42 AM
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
## 2016-10-10 LAB — GLUCOSE, CAPILLARY
GLUCOSE-CAPILLARY: 79 mg/dL (ref 65–99)
Glucose-Capillary: 151 mg/dL — ABNORMAL HIGH (ref 65–99)
Glucose-Capillary: 171 mg/dL — ABNORMAL HIGH (ref 65–99)
Glucose-Capillary: 320 mg/dL — ABNORMAL HIGH (ref 65–99)

## 2016-10-10 LAB — HEMOGLOBIN A1C
Hgb A1c MFr Bld: 11.2 % — ABNORMAL HIGH (ref 4.8–5.6)
Mean Plasma Glucose: 275 mg/dL

## 2016-10-10 MED ORDER — LURASIDONE HCL 40 MG PO TABS
60.0000 mg | ORAL_TABLET | Freq: Every day | ORAL | Status: DC
  Administered 2016-10-10 – 2016-10-11 (×2): 60 mg via ORAL
  Filled 2016-10-10 (×2): qty 2

## 2016-10-10 MED ORDER — DIAZEPAM 5 MG PO TABS
5.0000 mg | ORAL_TABLET | Freq: Every day | ORAL | Status: DC
  Administered 2016-10-11 – 2016-10-12 (×2): 5 mg via ORAL
  Filled 2016-10-10 (×2): qty 1

## 2016-10-10 NOTE — Progress Notes (Signed)
Pt spent the day in bed except to eat and take medications. Minimal interactions with staff/peers, delayed response noted. Continues to complain of neck pain, but is noted to be sleeping on his left side in bed most of the day. Lidoderm patch to neck as ordered. Does not appear to be as anxious as he was during previous encounters. Pt has been compliant with eating diabetic snacks, making wise meal choices today with better blood glucose levels. Did not attend group. Pt is medication compliant. Support and encouragement provided. Medication administered as ordered with education. Safety maintained with every 15 minute checks. Will continue to monitor.

## 2016-10-10 NOTE — Plan of Care (Signed)
Problem: Activity: Goal: Interest or engagement in leisure activities will improve Outcome: Not Progressing Pt isolative to room today, slept in bed all day except for meals/meds. Encouragement provided.

## 2016-10-10 NOTE — Progress Notes (Signed)
[facility] MD Progress Note  10/10/2016 2:12 PM Tony Cunningham  MRN:  [id number]  Subjective:   41 year old single male with history of schizoaffective disorder depressed type patient came voluntarily to our emergency department in the company of mobile crisis unit. He was complaining of hearing voices which were commanding him to harm himself. Patient reported that he was thinking about killing himself by running into traffic or drowning himself.   10/10/16:The patient has been isolative to his room this morning and had minimal interaction with staff and peers. He did eventually come out of his room for a little while but did not interact with peers. He was feeling very lethargic and tired this morning. He denies any current active or passive suicidal thoughts or psychotic symptoms. He says he does not want to go back to the group home because the other residents there are mean to him. He did not answer all questions asked that she was falling asleep and lethargic during the visit. He has been compliant with psychotropic medications. He slept over 8 hours last night. Blood pressure was slightly elevated this morning. Blood sugars have become more controlled compared to the time of admission.  Principal Problem: Schizoaffective disorder, depressive type (HCC) Diagnosis:   Patient Active Problem List   Diagnosis Date Noted  . Schizoaffective disorder, depressive type (HCC) [F25.1] 09/23/2016  . Polysubstance abuse [F19.10] 07/17/2016  . Tardive dyskinesia [G24.01] 07/16/2016  . Tobacco use disorder [F17.200] 07/07/2016  . Dyslipidemia [E78.5] 07/07/2016  . Asthma [J45.909] 07/07/2016  . HTN (hypertension) [I10] 07/06/2016  . Diabetes (HCC) [E11.9] 12/25/2010   Total Time spent with patient: 30 minutes  Past Psychiatric History: Patient has a long history of mental illness and has had multiple hospitalizations including multiple hospitalizations in [place]. Most of those records are unavailable to  me. 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 Not sure whether he ever returns to an adequate baseline. He tells me that he has tried to cut himself and kill himself in the past. Denies any history of violence. He can't remember the names of any of his medicines. Outpatient medicines listed on admission included Abilify and Effexor. Also hospitalized at [facility], [place].  Past Medical History:  Past Medical History:  Diagnosis Date  . Anxiety   . Asthma   . Diabetes mellitus   . High blood pressure   . Sinus complaint    History reviewed. No pertinent surgical history.   Family History: History reviewed. No pertinent family history.   Family Psychiatric  History: None known   Social History: Living in a group home. Has been there about 4-6 months he estimates. Says that he does not like it. 11 grade education. Does not have a legal guardian  History  Alcohol Use No     History  Drug Use No    Social History   Social History  . Marital status: Single    Spouse name: [name]  . Number of children: [name]  . Years of education: [name]   Social History Main Topics  . Smoking status: Current Every Day Smoker    Packs/day: 0.50    Types: Cigarettes  . Smokeless tobacco: Never Used  . Alcohol use No  . Drug use: No  . Sexual activity: Not Currently   Other Topics Concern  . None   Social History Narrative  . None   Sleep: Poor  Appetite:  Fair  Current Medications: Current  Facility-Administered Medications  Medication Dose Route Frequency Provider Last Rate Last Dose  . acetaminophen (TYLENOL) tablet 650 mg  650 mg Oral Q6H PRN [name], [name], MD      . alum & mag hydroxide-simeth (MAALOX/MYLANTA) 200-200-20 MG/5ML suspension 30 mL  30 mL Oral Q4H PRN [name], [name], MD      . cloZAPine (CLOZARIL) tablet 50 mg  50 mg Oral QHS [name], MD   50 mg at 10/09/16 2103  . [START ON 10/11/2016]  diazepam (VALIUM) tablet 5 mg  5 mg Oral Daily Darliss Ridgel, MD      . fluvoxaMINE (LUVOX) tablet 50 mg  50 mg Oral QHS [name], MD   50 mg at 10/09/16 2105  . gabapentin (NEURONTIN) capsule 300 mg  300 mg Oral TID [name], [name], MD   300 mg at 10/10/16 1137  . insulin aspart (novoLOG) injection 0-15 Units  0-15 Units Subcutaneous TID WC [name], MD   3 Units at 10/10/16 0756  . insulin aspart (novoLOG) injection 0-5 Units  0-5 Units Subcutaneous QHS Hernandez-Gonzalez, Sue Lush, MD      . insulin aspart (novoLOG) injection 7 Units  7 Units Subcutaneous TID WC [name], [name], MD   7 Units at 10/10/16 1241  . insulin glargine (LANTUS) injection 39 Units  39 Units Subcutaneous QHS [name], [name], MD   39 Units at 10/09/16 2105  . lidocaine (LIDODERM) 5 % 1 patch  1 patch Transdermal Q24H [name], MD   1 patch at 10/10/16 1136  . lisinopril (PRINIVIL,ZESTRIL) tablet 20 mg  20 mg Oral Daily [name], [name], MD   20 mg at 10/10/16 0756  . lurasidone (LATUDA) tablet 60 mg  60 mg Oral Q supper Darliss Ridgel, MD      . magnesium hydroxide (MILK OF MAGNESIA) suspension 30 mL  30 mL Oral Daily PRN [name], [name], MD      . meloxicam (MOBIC) tablet 7.5 mg  7.5 mg Oral Daily [name], [name], MD   7.5 mg at 10/10/16 0756  . metFORMIN (GLUCOPHAGE) tablet 1,000 mg  1,000 mg Oral BID WC [name], [name], MD   1,000 mg at 10/10/16 0756  . montelukast (SINGULAIR) tablet 10 mg  10 mg Oral QHS [name], [name], MD   10 mg at 10/09/16 2104  . nicotine (NICODERM CQ - dosed in mg/24 hours) patch 21 mg  21 mg Transdermal Daily [name], MD   21 mg at 10/10/16 0759  . simvastatin (ZOCOR) tablet 40 mg  40 mg Oral QHS [name], [name], MD   40 mg at 10/09/16 2105  . venlafaxine XR (EFFEXOR-XR) 24 hr capsule 75 mg  75 mg Oral Q breakfast [name], MD   75 mg at 10/10/16 0756    Lab Results:  Results for orders placed or performed  during the hospital encounter of 10/08/16 (from the past 48 hour(s))  Glucose, capillary     Status: Abnormal   Collection Time: 10/08/16 11:17 PM  Result Value Ref Range   Glucose-Capillary 323 (H) 65 - 99 mg/dL  Glucose, capillary     Status: Abnormal   Collection Time: 10/09/16  6:58 AM  Result Value Ref Range   Glucose-Capillary 234 (H) 65 - 99 mg/dL  Hemoglobin X9J     Status: Abnormal   Collection Time: 10/09/16  7:59 AM  Result Value Ref Range   Hgb A1c MFr Bld 11.2 (H) 4.8 - 5.6 %  Comment: (NOTE)         Pre-diabetes: 5.7 - 6.4         Diabetes: >6.4         Glycemic control for adults with diabetes: <7.0    Mean Plasma Glucose 275 mg/dL    Comment: (NOTE) Performed At: [facility] [address] [place], [place] [id number] Mila Homer MD [id number]   Lipid panel     Status: Abnormal   Collection Time: 10/09/16  7:59 AM  Result Value Ref Range   Cholesterol 127 0 - 200 mg/dL   Triglycerides 80 <403 mg/dL   HDL 39 (L) >47 mg/dL   Total CHOL/HDL Ratio 3.3 RATIO   VLDL 16 0 - 40 mg/dL   LDL Cholesterol 72 0 - 99 mg/dL    Comment:        Total Cholesterol/HDL:CHD Risk Coronary Heart Disease Risk Table                     Men   Women  1/2 Average Risk   3.4   3.3  Average Risk       5.0   4.4  2 X Average Risk   9.6   7.1  3 X Average Risk  23.4   11.0        Use the calculated Patient Ratio above and the CHD Risk Table to determine the patient's CHD Risk.        ATP III CLASSIFICATION (LDL):  <100     mg/dL   Optimal  425-956  mg/dL   Near or Above                    Optimal  130-159  mg/dL   Borderline  387-564  mg/dL   High  >332     mg/dL   Very High   TSH     Status: None   Collection Time: 10/09/16  7:59 AM  Result Value Ref Range   TSH 1.474 0.350 - 4.500 uIU/mL    Comment: Performed by a 3rd Generation assay with a functional sensitivity of <=0.01 uIU/mL.  Troponin I     Status: None   Collection Time: 10/09/16  7:59 AM   Result Value Ref Range   Troponin I <0.03 <0.03 ng/mL  CBC with Differential/Platelet     Status: None   Collection Time: 10/09/16  7:59 AM  Result Value Ref Range   WBC 6.1 3.8 - 10.6 K/uL   RBC 4.98 4.40 - 5.90 MIL/uL   Hemoglobin 15.9 13.0 - 18.0 g/dL   HCT 95.1 88.4 - 16.6 %   MCV 90.5 80.0 - 100.0 fL   MCH 32.0 26.0 - 34.0 pg   MCHC 35.3 32.0 - 36.0 g/dL   RDW 06.3 01.6 - 01.0 %   Platelets 238 150 - 440 K/uL   Neutrophils Relative % 53 %   Neutro Abs 3.3 1.4 - 6.5 K/uL   Lymphocytes Relative 37 %   Lymphs Abs 2.3 1.0 - 3.6 K/uL   Monocytes Relative 5 %   Monocytes Absolute 0.3 0.2 - 1.0 K/uL   Eosinophils Relative 4 %   Eosinophils Absolute 0.2 0 - 0.7 K/uL   Basophils Relative 1 %   Basophils Absolute 0.1 0 - 0.1 K/uL  Glucose, capillary     Status: Abnormal   Collection Time: 10/09/16 11:42 AM  Result Value Ref Range   Glucose-Capillary 185 (H) 65 - 99 mg/dL  Glucose, capillary     Status: Abnormal   Collection Time: 10/09/16  4:38 PM  Result Value Ref Range   Glucose-Capillary 271 (H) 65 - 99 mg/dL  Glucose, capillary     Status: Abnormal   Collection Time: 10/09/16  8:26 PM  Result Value Ref Range   Glucose-Capillary 167 (H) 65 - 99 mg/dL  Glucose, capillary     Status: Abnormal   Collection Time: 10/10/16  6:37 AM  Result Value Ref Range   Glucose-Capillary 151 (H) 65 - 99 mg/dL  Glucose, capillary     Status: None   Collection Time: 10/10/16 11:35 AM  Result Value Ref Range   Glucose-Capillary 79 65 - 99 mg/dL    Blood Alcohol level:  Lab Results  Component Value Date   ETH <5 10/07/2016   ETH <5 09/23/2016    Metabolic Disorder Labs: Lab Results  Component Value Date   HGBA1C 11.2 (H) 10/09/2016   MPG 275 10/09/2016   MPG 275 09/24/2016   Lab Results  Component Value Date   PROLACTIN 24.5 (H) 09/24/2016   PROLACTIN 3.4 (L) 07/07/2016   Lab Results  Component Value Date   CHOL 127 10/09/2016   TRIG 80 10/09/2016   HDL 39 (L)  10/09/2016   CHOLHDL 3.3 10/09/2016   VLDL 16 10/09/2016   LDLCALC 72 10/09/2016   LDLCALC 56 07/07/2016    Physical Findings: AIMS:  , ,  ,  ,    CIWA:    COWS:     Musculoskeletal: Strength & Muscle Tone: within normal limits Gait & Station: normal Patient leans: [name]  Psychiatric Specialty Exam: Physical Exam  Nursing note and vitals reviewed. Constitutional: He appears well-nourished.  Neurological: He displays tremor.  Patient's neck does appear to be stiff. He is able to move it but he is having pain. He has tremors all over. Definitely has cogwheel rigidity in his arms.  Psychiatric: His speech is normal. His mood appears anxious. He is withdrawn. Thought content is paranoid. Cognition and memory are normal. He expresses impulsivity. He expresses no homicidal ideation.    Review of Systems  Constitutional: Negative.  Negative for chills, fever and weight loss.  HENT: Negative.  Negative for congestion, hearing loss and sore throat.   Eyes: Negative.  Negative for blurred vision and double vision.  Respiratory: Negative.  Negative for cough, hemoptysis, sputum production and shortness of breath.   Cardiovascular: Negative.  Negative for chest pain and palpitations.  Gastrointestinal: Negative for abdominal pain, constipation, diarrhea, heartburn, nausea and vomiting.  Genitourinary: Negative.  Negative for dysuria, frequency and urgency.  Musculoskeletal: Positive for neck pain. Negative for back pain, joint pain and myalgias.  Skin: Negative.  Negative for rash.  Neurological: Negative.  Negative for dizziness, tingling, tremors, sensory change, speech change and headaches.  Endo/Heme/Allergies: Negative.  Negative for environmental allergies. Does not bruise/bleed easily.  Psychiatric/Behavioral: The patient is nervous/anxious.     Blood pressure (!) 125/101, pulse (!) 126, temperature 98.6 F (37 C), temperature source Oral, resp. rate 18, height 5\' 9"  (1.753 m),  weight 73.9 kg (163 lb), SpO2 98 %.Body mass index is 24.07 kg/m.  General Appearance: Disheveled  Eye Contact:  Good  Speech:  Slow  Volume:  Decreased  Mood:  Dysphoric  Affect:  Blunt  Thought Process:  Irrelevant  Orientation:  NA  Thought Content:  Illogical  Suicidal Thoughts:  No  Homicidal Thoughts:  No  Memory:  Immediate;   Fair Recent;  Poor Remote;   Poor  Judgement:  Poor  Insight:  Lacking  Psychomotor Activity:  Normal  Concentration:  Concentration: Fair and Attention Span: Fair  Recall:  Poor  Fund of Knowledge:  Poor  Language:  Fair  Akathisia:  No  Handed:    AIMS (if indicated):     Assets:    ADL's:  Intact  Cognition:  Impaired,  Mild  Sleep:  Number of Hours: 8     Treatment Plan Summary: 41 y/o male with depression, SI and hallucinations. Multiple prior hospitalization and prior suicidal attempts. Just d/c last week.  Per review of records patient has history of abusing opiates, Lyrica, Remeron and Flexeril. Here he has been frequently asking for Ativan or clonazepam.  Schizoaffective d/o depressed type: multiple failures to antipsychotics. Pt frequently hospitalized.  He was started by Dr Ardyth Harps on a trial of clozaril at 50mg  po nightly but is very lethargic this morning.  For depressive Symptoms and we will start Luvox 50 mg daily at bedtime. Luvox will also increase the levels of Clozaril. By doing these we will need to lower doses of Clozaril for therapeutic effect.Will taper off of Effexor. Effexor dosage has been decreased to a total of 75 mg by mouth daily. Secondary to lethargy today and the addition of cholesterol, will lower Latuda from 80 mg by mouth daily with dinner to a total of 60 mg by mouth daily with dinner. We'll cross taper Clozaril and Latuda.   Akathisia/TD: I will discontinue amantadine for now. Clozaril should improve tardive dyskinesia  Insomnia: Patient on Valium 10 mgby mouth. No reported that medication should  be taken at bedtime but orders for daytime. We'll decrease Valium to a total 5 mg by mouth daily. Will eventually discontinue Valium.  History of abusing opiates, Flexeril, Lyrica and mirtazapine. We'll benefit from continuing outpatient substance abuse services.   For diabetes: Continue Lantus 39 units daily at bedtime. Continue NovoLog 70 units 3 times a day with meals. Continue metformin 1000 mg twice a day. Will place orders for supplemental insulin. Blood sugars are much better control. He will be maintained on a carbohydrate limited diet.  Hypertension: Continue lisinopril 20 mg a day. Hydralazine 25 mg tid will be d/c for now as his BP is wnl w/o it. He didn't receive it this am or after lunch  Dyslipidemia continue Zocor 40 mg at bedtime  Asthma continue Singulair 10 mg at bedtime  Neck pain: Continue Neurontin 300 mg 3 times a day and Mobic  whichwasd to 7.5 mg bid.  Will order lidocaine patch to neck. Multiple visits to the ER complaining of neck pain  Constipation: will order miralax daily and colace  Tobacco use disorder: will ordernicotine patch 21 mg a day  Labs: hemoglobin A1c, lipid panel, TSH all completed---we had all of these from Feb 2018  Diet carb modified and low sodium  Disposition:  back to his group home once stable   Follow up: continue to f/u with [facility] in [place]. Continue day program.  Will refer for case coordination   Labs Will monitor ANC and WBC  EKG on 5/3 wnl. We'll recheck EKG given the fact that he is starting Clozaril.  Consults: will consult DM coordinator and dietician  Levora Angel, MD 10/10/2016, 2:12 PM

## 2016-10-10 NOTE — [facility] Group Notes (Signed)
[facility] LCSW Group Therapy  10/10/2016 3:24 PM  Type of Therapy:  Group Therapy  Participation Level:  Patient did not attend group. CSW invited patient to group.   Summary of Progress/Problems: Coping Skills: Patients defined and discussed healthy coping skills. Patients identified healthy coping skills they would like to try during hospitalization and after discharge. CSW offered insight to varying coping skills that may have been new to patients such as practicing mindfulness.  Haniyah Maciolek G. Garnette Czech MSW, LCSWA 10/10/2016, 3:24 PM

## 2016-10-10 NOTE — Plan of Care (Signed)
Problem: Education: Goal: Utilization of techniques to improve thought processes will improve Outcome: Progressing Patient was assertive about his glucose monitoring and time for his snack.

## 2016-10-10 NOTE — Progress Notes (Signed)
Patient was reminded not to eat snack and he agreed.  However,  an individual snack never arrived to the unit and before staff prepared a snack, he picked up a granola bar and consumed it.  Plan is to continue to reinforce consuming snacks prepared by dietary.

## 2016-10-11 LAB — GLUCOSE, CAPILLARY
GLUCOSE-CAPILLARY: 314 mg/dL — AB (ref 65–99)
GLUCOSE-CAPILLARY: 119 mg/dL — AB (ref 65–99)
GLUCOSE-CAPILLARY: 89 mg/dL (ref 65–99)
Glucose-Capillary: 89 mg/dL (ref 65–99)

## 2016-10-11 MED ORDER — CLOZAPINE 100 MG PO TABS
100.0000 mg | ORAL_TABLET | Freq: Every day | ORAL | Status: DC
  Administered 2016-10-11: 100 mg via ORAL
  Filled 2016-10-11: qty 1

## 2016-10-11 NOTE — [facility] Group Notes (Signed)
[facility] Group Notes:  (Nursing/MHT/Case Management/Adjunct)  Date:  10/11/2016  Time:  6:00 AM  Type of Therapy:  Psychoeducational Skills  Participation Level:  Did Not Attend  Summary of Progress/Problems:  Tony Cunningham 10/11/2016, 6:00 AM

## 2016-10-11 NOTE — Progress Notes (Signed)
Pt stayed in bed all day except for meals/meds. Denies SI/HI/AVH. Complains of neck pain but reports relief from medications as ordered. No behavioral issues noted. Delayed responses. Appears anxious, tense. Did not attend group. Pt compliant with eating appropriate snacks as provided by dietary. Intense eye contact. Medication compliant. Support and encouragement provided. Medications administered as ordered with education. Safety maintained with every 15 minute checks. Will continue to monitor.

## 2016-10-11 NOTE — [facility] Group Notes (Signed)
[facility] LCSW Group Therapy  10/11/2016 3:54 PM  Type of Therapy:  Group Therapy  Participation Level:  Patient did not attend group. CSW invited patient to group.   Summary of Progress/Problems: Stages of Grief: Group facilitator defined grief and the stages of the grieving process. Facilitator offered support to patients and allowed patients to share openly about their experiences with grief. Patients explored how their individual experiences with grief have impacted their wellness and recovery. Each patient was challenged to recognize their obstacles in finding acceptance after experiencing a loss. Facilitator processed with patients new coping skills to overcome their challenges with grief. Group members received and gave support to group members.   Avril Busser G. Garnette Czech MSW, LCSWA 10/11/2016, 3:54 PM

## 2016-10-11 NOTE — Plan of Care (Signed)
Problem: Activity: Goal: Interest or engagement in leisure activities will improve Outcome: Not Progressing Pt isolative to room, in bed all day today. Did not attend group. Encouragement provided.

## 2016-10-11 NOTE — Progress Notes (Signed)
[facility] MD Progress Note  10/11/2016 1:43 PM Tony Cunningham  MRN:  [id number]  Subjective:   41 year old single male with history of schizoaffective disorder depressed type patient came voluntarily to our emergency department in the company of mobile crisis unit. He was complaining of hearing voices which were commanding him to harm himself. Patient reported that he was thinking about killing himself by running into traffic or drowning himself.  10/10/16:The patient has been isolative to his room this morning and had minimal interaction with staff and peers. He did eventually come out of his room for a little while but did not interact with peers. He was feeling very lethargic and tired this morning. He denies any current active or passive suicidal thoughts or psychotic symptoms. He says he does not want to go back to the group home because the other residents there are mean to him. He did not answer all questions asked that she was falling asleep and lethargic during the visit. He has been compliant with psychotropic medications. He slept over 8 hours last night. Blood pressure was slightly elevated this morning. Blood sugars have become more controlled compared to the time of admission.  10/11/16:  The patient reports that his anxiety has decreased with the Clozaril and that is the only positive changes noticed with the medication. He is not as sedated or tired this morning compared to yesterday. He did get up for breakfast but otherwise has been isolative to his room. He did not go outside with the other patients and has not been attending groups. The patient gave very refer responses to questions asked. He denies any current active or passive suicidal thoughts or hallucinations. He denied paranoid thoughts but did express paranoid thoughts about the group home and people being upset with him for calling the ambulance. He denies any somatic complaints. Blood sugars have been elevated. Per nursing, he slept  7.45 hours last night but has been sleeping intermittently throughout the daytime. Vital signs are stable.    Principal Problem: Schizoaffective disorder, depressive type (HCC) Diagnosis:   Patient Active Problem List   Diagnosis Date Noted  . Schizoaffective disorder, depressive type (HCC) [F25.1] 09/23/2016  . Polysubstance abuse [F19.10] 07/17/2016  . Tardive dyskinesia [G24.01] 07/16/2016  . Tobacco use disorder [F17.200] 07/07/2016  . Dyslipidemia [E78.5] 07/07/2016  . Asthma [J45.909] 07/07/2016  . HTN (hypertension) [I10] 07/06/2016  . Diabetes (HCC) [E11.9] 12/25/2010   Total Time spent with patient: 30 minutes  Past Psychiatric History: Patient has a long history of mental illness and has had multiple hospitalizations including multiple hospitalizations in [place]. Most of those records are unavailable to me. 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 Not sure whether he ever returns to an adequate baseline. He tells me that he has tried to cut himself and kill himself in the past. Denies any history of violence. He can't remember the names of any of his medicines. Outpatient medicines listed on admission included Abilify and Effexor. Also hospitalized at [facility], [place].  Past Medical History:  Past Medical History:  Diagnosis Date  . Anxiety   . Asthma   . Diabetes mellitus   . High blood pressure   . Sinus complaint    History reviewed. No pertinent surgical history.   Family History: History reviewed. No pertinent family history.   Family Psychiatric  History: None known   Social History: Living in a group home. Has been there about 4-6  months he estimates. Says that he does not like it. 11 grade education. Does not have a legal guardian  History  Alcohol Use No     History  Drug Use No    Social History   Social History  . Marital status: Single    Spouse name: [name]  . Number of children:  [name]  . Years of education: [name]   Social History Main Topics  . Smoking status: Current Every Day Smoker    Packs/day: 0.50    Types: Cigarettes  . Smokeless tobacco: Never Used  . Alcohol use No  . Drug use: No  . Sexual activity: Not Currently   Other Topics Concern  . None   Social History Narrative  . None   Sleep: Improved but also sleeping during daytime  Appetite:  Fair  Current Medications: Current Facility-Administered Medications  Medication Dose Route Frequency Provider Last Rate Last Dose  . acetaminophen (TYLENOL) tablet 650 mg  650 mg Oral Q6H PRN [name], [name], MD      . alum & mag hydroxide-simeth (MAALOX/MYLANTA) 200-200-20 MG/5ML suspension 30 mL  30 mL Oral Q4H PRN [name], [name], MD      . cloZAPine (CLOZARIL) tablet 100 mg  100 mg Oral QHS Darliss Ridgel, MD      . diazepam (VALIUM) tablet 5 mg  5 mg Oral Daily Darliss Ridgel, MD   5 mg at 10/11/16 0747  . fluvoxaMINE (LUVOX) tablet 50 mg  50 mg Oral QHS [name], MD   50 mg at 10/10/16 2143  . gabapentin (NEURONTIN) capsule 300 mg  300 mg Oral TID [name], [name], MD   300 mg at 10/11/16 1202  . insulin aspart (novoLOG) injection 0-15 Units  0-15 Units Subcutaneous TID WC [name], MD   11 Units at 10/11/16 0747  . insulin aspart (novoLOG) injection 0-5 Units  0-5 Units Subcutaneous QHS [name], MD   4 Units at 10/10/16 2039  . insulin aspart (novoLOG) injection 7 Units  7 Units Subcutaneous TID WC [name], [name], MD   7 Units at 10/11/16 1203  . insulin glargine (LANTUS) injection 39 Units  39 Units Subcutaneous QHS [name], [name], MD   39 Units at 10/10/16 2041  . lidocaine (LIDODERM) 5 % 1 patch  1 patch Transdermal Q24H [name], MD   1 patch at 10/11/16 1202  . lisinopril (PRINIVIL,ZESTRIL) tablet 20 mg  20 mg Oral Daily [name], [name], MD   20 mg at 10/11/16 0747  . lurasidone (LATUDA) tablet 60 mg  60 mg Oral Q supper Darliss Ridgel, MD   60 mg at 10/10/16 1634  . magnesium hydroxide (MILK OF MAGNESIA) suspension 30 mL  30 mL Oral Daily PRN [name], [name], MD      . meloxicam (MOBIC) tablet 7.5 mg  7.5 mg Oral Daily [name], [name], MD   7.5 mg at 10/11/16 0747  . metFORMIN (GLUCOPHAGE) tablet 1,000 mg  1,000 mg Oral BID WC [name], [name], MD   1,000 mg at 10/11/16 0747  . montelukast (SINGULAIR) tablet 10 mg  10 mg Oral QHS [name], [name], MD   10 mg at 10/10/16 2140  . nicotine (NICODERM CQ - dosed in mg/24 hours) patch 21 mg  21 mg Transdermal Daily [name], MD   21 mg at 10/11/16 0751  . simvastatin (ZOCOR) tablet 40 mg  40 mg Oral QHS [name], [name], MD  40 mg at 10/10/16 2142  . venlafaxine XR (EFFEXOR-XR) 24 hr capsule 75 mg  75 mg Oral Q breakfast [name], MD   75 mg at 10/11/16 0747    Lab Results:  Results for orders placed or performed during the hospital encounter of 10/08/16 (from the past 48 hour(s))  Glucose, capillary     Status: Abnormal   Collection Time: 10/09/16  4:38 PM  Result Value Ref Range   Glucose-Capillary 271 (H) 65 - 99 mg/dL  Glucose, capillary     Status: Abnormal   Collection Time: 10/09/16  8:26 PM  Result Value Ref Range   Glucose-Capillary 167 (H) 65 - 99 mg/dL  Glucose, capillary     Status: Abnormal   Collection Time: 10/10/16  6:37 AM  Result Value Ref Range   Glucose-Capillary 151 (H) 65 - 99 mg/dL  Glucose, capillary     Status: None   Collection Time: 10/10/16 11:35 AM  Result Value Ref Range   Glucose-Capillary 79 65 - 99 mg/dL  Glucose, capillary     Status: Abnormal   Collection Time: 10/10/16  4:31 PM  Result Value Ref Range   Glucose-Capillary 171 (H) 65 - 99 mg/dL   Comment 1 Notify RN   Glucose, capillary     Status: Abnormal   Collection Time: 10/10/16  8:32 PM  Result Value Ref Range   Glucose-Capillary 320 (H) 65 - 99 mg/dL   Comment 1 Notify RN   Glucose, capillary     Status: Abnormal   Collection Time:  10/11/16  6:24 AM  Result Value Ref Range   Glucose-Capillary 314 (H) 65 - 99 mg/dL  Glucose, capillary     Status: None   Collection Time: 10/11/16 11:45 AM  Result Value Ref Range   Glucose-Capillary 89 65 - 99 mg/dL   Comment 1 Notify RN     Blood Alcohol level:  Lab Results  Component Value Date   ETH <5 10/07/2016   ETH <5 09/23/2016    Metabolic Disorder Labs: Lab Results  Component Value Date   HGBA1C 11.2 (H) 10/09/2016   MPG 275 10/09/2016   MPG 275 09/24/2016   Lab Results  Component Value Date   PROLACTIN 24.5 (H) 09/24/2016   PROLACTIN 3.4 (L) 07/07/2016   Lab Results  Component Value Date   CHOL 127 10/09/2016   TRIG 80 10/09/2016   HDL 39 (L) 10/09/2016   CHOLHDL 3.3 10/09/2016   VLDL 16 10/09/2016   LDLCALC 72 10/09/2016   LDLCALC 56 07/07/2016     Musculoskeletal: Strength & Muscle Tone: within normal limits Gait & Station: normal Patient leans: [name]  Psychiatric Specialty Exam: Physical Exam  Nursing note and vitals reviewed. Constitutional: He appears well-nourished.  Neurological: He displays tremor.  Patient's neck does appear to be stiff. He is able to move it but he is having pain. He has tremors all over. Definitely has cogwheel rigidity in his arms.  Psychiatric: His speech is normal. His mood appears anxious. He is withdrawn. Thought content is paranoid. Cognition and memory are normal. He expresses impulsivity. He expresses no homicidal ideation.    Review of Systems  Constitutional: Negative.  Negative for chills, fever and weight loss.  HENT: Negative.  Negative for congestion, hearing loss and sore throat.   Eyes: Negative.  Negative for blurred vision and double vision.  Respiratory: Negative.  Negative for cough, hemoptysis, sputum production and shortness of breath.   Cardiovascular: Negative.  Negative for chest pain  and palpitations.  Gastrointestinal: Negative for abdominal pain, constipation, diarrhea, heartburn, nausea  and vomiting.  Genitourinary: Negative.  Negative for dysuria, frequency and urgency.  Musculoskeletal: Positive for neck pain. Negative for back pain, joint pain and myalgias.  Skin: Negative.  Negative for rash.  Neurological: Negative.  Negative for dizziness, tingling, tremors, sensory change, speech change and headaches.  Endo/Heme/Allergies: Negative.  Negative for environmental allergies. Does not bruise/bleed easily.  Psychiatric/Behavioral: The patient is nervous/anxious.     Blood pressure 118/86, pulse 96, temperature 98 F (36.7 C), temperature source Oral, resp. rate 18, height 5\' 9"  (1.753 m), weight 73.9 kg (163 lb), SpO2 98 %.Body mass index is 24.07 kg/m.  General Appearance: Disheveled  Eye Contact:  Good  Speech:  Slow  Volume:  Decreased  Mood:  Dysphoric  Affect:  Blunt  Thought Process:  Irrelevant  Orientation:  NA  Thought Content:  Illogical  Suicidal Thoughts:  No  Homicidal Thoughts:  No  Memory:  Immediate;   Fair Recent;   Poor Remote;   Poor  Judgement:  Poor  Insight:  Lacking  Psychomotor Activity:  Normal  Concentration:  Concentration: Fair and Attention Span: Fair  Recall:  Poor  Fund of Knowledge:  Poor  Language:  Fair  Akathisia:  No  Handed:    AIMS (if indicated):     Assets:    ADL's:  Intact  Cognition:  Impaired,  Mild  Sleep:  Number of Hours: 7.45     Treatment Plan Summary: 41 y/o male with depression, SI and hallucinations. Multiple prior hospitalization and prior suicidal attempts. Just d/c last week.  Per review of records patient has history of abusing opiates, Lyrica, Remeron and Flexeril. Here he has been frequently asking for Ativan or clonazepam.  Schizoaffective d/o depressed type: multiple failures to antipsychotics. Pt frequently hospitalized.  He was started by Dr Ardyth Harps on a trial of clozaril at 50mg  po nightly and dose will be increased to 100mg  po nightly.  For depressive Symptoms he was started on  Luvox 50 mg daily at bedtime. Luvox will also increase the levels of Clozaril. By doing these we will need to lower doses of Clozaril for therapeutic effect.Will taper off of Effexor. Effexor dosage has been decreased to a total of 75 mg by mouth daily. Secondary to lethargy on Saturday, will lower Latuda from 80 mg by mouth daily with dinner to a total of 60 mg by mouth daily with dinner. We'll cross taper Clozaril and Latuda.   Akathisia/TD: Amantadine was discontinued for now. Clozaril should improve tardive dyskinesia  Insomnia: Decreased Valium to a total 5 mg by mouth daily. Will eventually discontinue Valium due to risk of dependency. Order placed for daytime by Dr Ardyth Harps rather than bedtime?Marland Kitchen   History of abusing opiates, Flexeril, Lyrica and mirtazapine. We'll benefit from continuing outpatient substance abuse services.   For diabetes: Continue Lantus 39 units daily at bedtime. Continue NovoLog 70 units 3 times a day with meals. Continue metformin 1000 mg twice a day. Will place orders for supplemental insulin. Blood sugars are much better control. He will be maintained on a carbohydrate limited diet.  Hypertension: Continue lisinopril 20 mg a day. Hydralazine 25 mg tid will be d/c for now as his BP is wnl w/o it. He didn't receive it this am or after lunch  Dyslipidemia continue Zocor 40 mg at bedtime  Asthma continue Singulair 10 mg at bedtime  Neck pain: Continue Neurontin 300 mg 3 times a day  and Mobic  whichwasd to 7.5 mg bid.  Will order lidocaine patch to neck. Multiple visits to the ER complaining of neck pain  Constipation: will order miralax daily and colace  Tobacco use disorder: will ordernicotine patch 21 mg a day  Labs: hemoglobin A1c, lipid panel, TSH all completed---we had all of these from Feb 2018  Diet carb modified and low sodium  Disposition:  back to his group home once stable   Follow up: continue to f/u with [facility] in  [place]. Continue day program.  Will refer for case coordination   Labs Will monitor ANC and WBC  EKG on 5/3 wnl. We'll recheck EKG given the fact that he is starting Clozaril.  Consults: will consult DM coordinator and dietician  Levora Angel, MD 10/11/2016, 1:43 PM

## 2016-10-11 NOTE — Progress Notes (Signed)
Patient continues to display more interest in medications and diabetes management.  He asked for the names of his medications and can state the purpose of each.  When BG was obtained, he started a discussion on food choices for HS snack and revealed that he "will not eat the bread".  He repeatedly asked for a granola bar which led to a discussion on CHO content in foods.  He demonstrated understanding the carbohydrate content information but continued to state he would not eat the bread.  He consumed milk with cereal instead and agreed not to have any extra calories.

## 2016-10-11 NOTE — Plan of Care (Signed)
Problem: Coping: Goal: Ability to verbalize frustrations and anger appropriately will improve Outcome: Progressing Patient spent more time out of his room this evening and did a good job of complying with consumption of his snack from dietary versus eating food from the dayroom.  He had increased eye contact and used sentences when talking with Clinical research associate (versus just one word answers).  He denies negative thoughts or impulses and contracts for safety.

## 2016-10-11 NOTE — Plan of Care (Signed)
Problem: Activity: Goal: Interest or engagement in leisure activities will improve Outcome: Progressing Patient engaged with Clinical research associate in education discussion on nutritional choices for diabetes.

## 2016-10-12 LAB — GLUCOSE, CAPILLARY
GLUCOSE-CAPILLARY: 287 mg/dL — AB (ref 65–99)
GLUCOSE-CAPILLARY: 66 mg/dL (ref 65–99)
GLUCOSE-CAPILLARY: 67 mg/dL (ref 65–99)
GLUCOSE-CAPILLARY: 269 mg/dL — AB (ref 65–99)
Glucose-Capillary: 133 mg/dL — ABNORMAL HIGH (ref 65–99)
Glucose-Capillary: 189 mg/dL — ABNORMAL HIGH (ref 65–99)

## 2016-10-12 MED ORDER — VENLAFAXINE HCL ER 37.5 MG PO CP24
37.5000 mg | ORAL_CAPSULE | Freq: Every day | ORAL | Status: DC
  Administered 2016-10-13 – 2016-10-14 (×2): 37.5 mg via ORAL
  Filled 2016-10-12 (×3): qty 1

## 2016-10-12 MED ORDER — POLYETHYLENE GLYCOL 3350 17 G PO PACK
17.0000 g | PACK | Freq: Every day | ORAL | Status: DC
  Administered 2016-10-13 – 2016-10-20 (×6): 17 g via ORAL
  Filled 2016-10-12 (×9): qty 1

## 2016-10-12 MED ORDER — LURASIDONE HCL 40 MG PO TABS
40.0000 mg | ORAL_TABLET | Freq: Every day | ORAL | Status: AC
  Administered 2016-10-12: 40 mg via ORAL
  Filled 2016-10-12: qty 1

## 2016-10-12 MED ORDER — CLOZAPINE 100 MG PO TABS
150.0000 mg | ORAL_TABLET | Freq: Every day | ORAL | Status: DC
  Administered 2016-10-12: 150 mg via ORAL
  Filled 2016-10-12: qty 2

## 2016-10-12 MED ORDER — FLUVOXAMINE MALEATE 50 MG PO TABS
50.0000 mg | ORAL_TABLET | Freq: Every day | ORAL | Status: DC
  Administered 2016-10-12: 50 mg via ORAL
  Filled 2016-10-12: qty 1

## 2016-10-12 MED ORDER — FLUVOXAMINE MALEATE 50 MG PO TABS: 100.0000 mg | ORAL_TABLET | Freq: Every day | ORAL | Status: DC

## 2016-10-12 MED ORDER — PROPRANOLOL HCL 20 MG PO TABS
20.0000 mg | ORAL_TABLET | Freq: Two times a day (BID) | ORAL | Status: DC
  Administered 2016-10-12 – 2016-10-20 (×15): 20 mg via ORAL
  Filled 2016-10-12 (×15): qty 1

## 2016-10-12 MED ORDER — MELOXICAM 7.5 MG PO TABS
7.5000 mg | ORAL_TABLET | Freq: Two times a day (BID) | ORAL | Status: DC
  Administered 2016-10-12 – 2016-10-20 (×17): 7.5 mg via ORAL
  Filled 2016-10-12 (×17): qty 1

## 2016-10-12 NOTE — Progress Notes (Signed)
Recreation Therapy Notes  Date: 05.21.18 Time: 9:30 am Location: Craft Room  Group Topic: Self-expression  Goal Area(s) Addresses:  Patient will be able to identify a color that represents each emotion. Patient will verbalize benefit of using art as a means of self-expression. Patient will verbalize one emotion experienced while participating in activity.  Behavioral Response: Did not attend  Intervention: The Colors Within Me  Activity: Patients were given a blank face worksheet and were instructed to pick a color for each emotion they were feeling and show on the worksheet how much of that emotion they were feeling.  Education: LRT educated patient on other forms of self-expression.  Education Outcome: Patient did not attend group.  Clinical Observations/Feedback: Patient did not attend group.  Jacquelynn Cree, LRT/CTRS 10/12/2016 10:14 AM

## 2016-10-12 NOTE — Plan of Care (Signed)
Problem: Self-Concept: Goal: Level of anxiety will decrease Outcome: Progressing Denies feeling anxious, however pt is isolative to room. Encouragement provided.

## 2016-10-12 NOTE — [facility] Group Notes (Signed)
[facility] LCSW Group Therapy Note  Date/Time:10/12/16, 1300  Type of Therapy and Topic:  Group Therapy:  Overcoming Obstacles  Participation Level:  Pt did not attend group.  Description of Group:    In this group patients will be encouraged to explore what they see as obstacles to their own wellness and recovery. They will be guided to discuss their thoughts, feelings, and behaviors related to these obstacles. The group will process together ways to cope with barriers, with attention given to specific choices patients can make. Each patient will be challenged to identify changes they are motivated to make in order to overcome their obstacles. This group will be process-oriented, with patients participating in exploration of their own experiences as well as giving and receiving support and challenge from other group members.  Therapeutic Goals: 1. Patient will identify personal and current obstacles as they relate to admission. 2. Patient will identify barriers that currently interfere with their wellness or overcoming obstacles.  3. Patient will identify feelings, thought process and behaviors related to these barriers. 4. Patient will identify two changes they are willing to make to overcome these obstacles:    Summary of Patient Progress      Therapeutic Modalities:   Cognitive Behavioral Therapy Solution Focused Therapy Motivational Interviewing Relapse Prevention Therapy  Greg Navina Wohlers, LCSW 

## 2016-10-12 NOTE — Progress Notes (Signed)
Hypoglycemic Event  CBG:66  Treatment: 15 GM carbohydrate snack  Symptoms: None  Follow-up CBG: Time:1144/1217 CBG Result:67/133  Possible Reasons for Event: Inadequate meal intake  Comments/MD notified:CBG before lunch, 66. After lunch at 1217, CBG 133. No insulin given. Will continue to monitor.     Tonye Pearson

## 2016-10-12 NOTE — Progress Notes (Addendum)
[facility] - Closed MD Progress Note  10/12/2016 12:39 PM Tony Cunningham  MRN:  [id number]  Subjective:   41 year old single male with history of schizoaffective disorder depressed type patient came voluntarily to our emergency department in the company of mobile crisis unit. He was complaining of hearing voices which were commanding him to harm himself. Patient reported that he was thinking about killing himself by running into traffic or drowning himself.  10/10/16:The patient has been isolative to his room this morning and had minimal interaction with staff and peers. He did eventually come out of his room for a little while but did not interact with peers. He was feeling very lethargic and tired this morning. He denies any current active or passive suicidal thoughts or psychotic symptoms. He says he does not want to go back to the group home because the other residents there are mean to him. He did not answer all questions asked that she was falling asleep and lethargic during the visit. He has been compliant with psychotropic medications. He slept over 8 hours last night. Blood pressure was slightly elevated this morning. Blood sugars have become more controlled compared to the time of admission.  10/11/16:  The patient reports that his anxiety has decreased with the Clozaril and that is the only positive changes noticed with the medication. He is not as sedated or tired this morning compared to yesterday. He did get up for breakfast but otherwise has been isolative to his room. He did not go outside with the other patients and has not been attending groups. The patient gave very refer responses to questions asked. He denies any current active or passive suicidal thoughts or hallucinations. He denied paranoid thoughts but did express paranoid thoughts about the group home and people being upset with him for calling the ambulance. He denies any somatic complaints. Blood sugars have been elevated. Per nursing, he slept  7.45 hours last night but has been sleeping intermittently throughout the daytime. Vital signs are stable.  5/21 patient has been withdrawn to his room. Minimal participation in programming. Tells me he is not suicidal, homicidal and does not have hallucinations. He reports feeling well denies problems with appetite, energy or concentration. Says that  he is not sleeping well throughout the night. Nurses told me he has been is sleeping all throughout the day. Patient denies lightheadedness, constipation, drooling or dizziness.  Per nursing: Patient continues to display more interest in medications and diabetes management.  He asked for the names of his medications and can state the purpose of each.  When BG was obtained, he started a discussion on food choices for HS snack and revealed that he "will not eat the bread".  He repeatedly asked for a granola bar which led to a discussion on CHO content in foods.  He demonstrated understanding the carbohydrate content information but continued to state he would not eat the bread.  He consumed milk with cereal instead and agreed not to have any extra calories.  Principal Problem: Schizoaffective disorder, depressive type (HCC) Diagnosis:   Patient Active Problem List   Diagnosis Date Noted  . Schizoaffective disorder, depressive type (HCC) [F25.1] 09/23/2016  . Polysubstance abuse [F19.10] 07/17/2016  . Tardive dyskinesia [G24.01] 07/16/2016  . Tobacco use disorder [F17.200] 07/07/2016  . Dyslipidemia [E78.5] 07/07/2016  . Asthma [J45.909] 07/07/2016  . HTN (hypertension) [I10] 07/06/2016  . Diabetes (HCC) [E11.9] 12/25/2010   Total Time spent with patient: 30 minutes  Past Psychiatric History: Patient has a long history  of mental illness and has had multiple hospitalizations including multiple hospitalizations in [place]. Most of those records are unavailable to me. 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 Not sure whether he ever returns to an adequate baseline. He tells me that he has tried to cut himself and kill himself in the past. Denies any history of violence. He can't remember the names of any of his medicines. Outpatient medicines listed on admission included Abilify and Effexor. Also hospitalized at [facility] [place].  Past Medical History:  Past Medical History:  Diagnosis Date  . Anxiety   . Asthma   . Diabetes mellitus   . High blood pressure   . Sinus complaint    History reviewed. No pertinent surgical history.   Family History: History reviewed. No pertinent family history.   Family Psychiatric  History: None known   Social History: Living in a group home. Has been there about 4-6 months he estimates. Says that he does not like it. 11 grade education. Does not have a legal guardian  History  Alcohol Use No     History  Drug Use No    Social History   Social History  . Marital status: Single    Spouse name: [name]  . Number of children: [name]  . Years of education: [name]   Social History Main Topics  . Smoking status: Current Every Day Smoker    Packs/day: 0.50    Types: Cigarettes  . Smokeless tobacco: Never Used  . Alcohol use No  . Drug use: No  . Sexual activity: Not Currently   Other Topics Concern  . None   Social History Narrative  . None   Sleep: Improved but also sleeping during daytime  Appetite:  Fair  Current Medications: Current Facility-Administered Medications  Medication Dose Route Frequency Provider Last Rate Last Dose  . acetaminophen (TYLENOL) tablet 650 mg  650 mg Oral Q6H PRN [name], [name], MD      . alum & mag hydroxide-simeth (MAALOX/MYLANTA) 200-200-20 MG/5ML suspension 30 mL  30 mL Oral Q4H PRN [name], [name], MD      . cloZAPine (CLOZARIL) tablet 150 mg  150 mg Oral QHS [name], MD      . fluvoxaMINE (LUVOX) tablet 100 mg  100 mg Oral QHS [name], MD      .  gabapentin (NEURONTIN) capsule 300 mg  300 mg Oral TID [name], [name], MD   300 mg at 10/12/16 1214  . insulin aspart (novoLOG) injection 0-15 Units  0-15 Units Subcutaneous TID WC [name], MD   8 Units at 10/12/16 0801  . insulin aspart (novoLOG) injection 0-5 Units  0-5 Units Subcutaneous QHS [name], MD   4 Units at 10/10/16 2039  . insulin aspart (novoLOG) injection 7 Units  7 Units Subcutaneous TID WC [name], [name], MD   7 Units at 10/12/16 0800  . insulin glargine (LANTUS) injection 39 Units  39 Units Subcutaneous QHS [name], [name], MD   39 Units at 10/11/16 2135  . lidocaine (LIDODERM) 5 % 1 patch  1 patch Transdermal Q24H [name], MD   1 patch at 10/12/16 1222  . lisinopril (PRINIVIL,ZESTRIL) tablet 20 mg  20 mg Oral Daily [name], [name], MD   20 mg at 10/12/16 0802  . lurasidone (LATUDA) tablet 40 mg  40 mg Oral Q supper [name], MD      .  magnesium hydroxide (MILK OF MAGNESIA) suspension 30 mL  30 mL Oral Daily PRN [name], [name], MD      . meloxicam (MOBIC) tablet 7.5 mg  7.5 mg Oral BID [name], MD      . metFORMIN (GLUCOPHAGE) tablet 1,000 mg  1,000 mg Oral BID WC [name], [name], MD   1,000 mg at 10/12/16 0802  . montelukast (SINGULAIR) tablet 10 mg  10 mg Oral QHS [name], [name], MD   10 mg at 10/11/16 2135  . nicotine (NICODERM CQ - dosed in mg/24 hours) patch 21 mg  21 mg Transdermal Daily [name], MD   21 mg at 10/12/16 0805  . polyethylene glycol (MIRALAX / GLYCOLAX) packet 17 g  17 g Oral Daily [name], MD      . propranolol (INDERAL) tablet 20 mg  20 mg Oral BID [name], MD      . simvastatin (ZOCOR) tablet 40 mg  40 mg Oral QHS [name], [name], MD   40 mg at 10/11/16 2135  . [START ON 10/13/2016] venlafaxine XR (EFFEXOR-XR) 24 hr capsule 37.5 mg  37.5 mg Oral Q breakfast [name], MD        Lab  Results:  Results for orders placed or performed during the hospital encounter of 10/08/16 (from the past 48 hour(s))  Glucose, capillary     Status: Abnormal   Collection Time: 10/10/16  4:31 PM  Result Value Ref Range   Glucose-Capillary 171 (H) 65 - 99 mg/dL   Comment 1 Notify RN   Glucose, capillary     Status: Abnormal   Collection Time: 10/10/16  8:32 PM  Result Value Ref Range   Glucose-Capillary 320 (H) 65 - 99 mg/dL   Comment 1 Notify RN   Glucose, capillary     Status: Abnormal   Collection Time: 10/11/16  6:24 AM  Result Value Ref Range   Glucose-Capillary 314 (H) 65 - 99 mg/dL  Glucose, capillary     Status: None   Collection Time: 10/11/16 11:45 AM  Result Value Ref Range   Glucose-Capillary 89 65 - 99 mg/dL   Comment 1 Notify RN   Glucose, capillary     Status: Abnormal   Collection Time: 10/11/16  4:35 PM  Result Value Ref Range   Glucose-Capillary 119 (H) 65 - 99 mg/dL  Glucose, capillary     Status: None   Collection Time: 10/11/16  8:50 PM  Result Value Ref Range   Glucose-Capillary 89 65 - 99 mg/dL  Glucose, capillary     Status: Abnormal   Collection Time: 10/12/16  6:42 AM  Result Value Ref Range   Glucose-Capillary 287 (H) 65 - 99 mg/dL  Glucose, capillary     Status: None   Collection Time: 10/12/16 11:28 AM  Result Value Ref Range   Glucose-Capillary 66 65 - 99 mg/dL  Glucose, capillary     Status: None   Collection Time: 10/12/16 11:44 AM  Result Value Ref Range   Glucose-Capillary 67 65 - 99 mg/dL  Glucose, capillary     Status: Abnormal   Collection Time: 10/12/16 12:17 PM  Result Value Ref Range   Glucose-Capillary 133 (H) 65 - 99 mg/dL    Blood Alcohol level:  Lab Results  Component Value Date   ETH <5 10/07/2016   ETH <5 09/23/2016    Metabolic Disorder Labs: Lab Results  Component Value Date   HGBA1C 11.2 (H) 10/09/2016   MPG 275 10/09/2016  MPG 275 09/24/2016   Lab Results  Component Value Date   PROLACTIN 24.5 (H)  09/24/2016   PROLACTIN 3.4 (L) 07/07/2016   Lab Results  Component Value Date   CHOL 127 10/09/2016   TRIG 80 10/09/2016   HDL 39 (L) 10/09/2016   CHOLHDL 3.3 10/09/2016   VLDL 16 10/09/2016   LDLCALC 72 10/09/2016   LDLCALC 56 07/07/2016     Musculoskeletal: Strength & Muscle Tone: within normal limits Gait & Station: normal Patient leans: [name]  Psychiatric Specialty Exam: Physical Exam  Nursing note and vitals reviewed. Constitutional: He is oriented to person, place, and time. He appears well-developed and well-nourished.  HENT:  Head: Normocephalic and atraumatic.  Eyes: Conjunctivae and EOM are normal.  Neck: Normal range of motion.  Respiratory: Effort normal.  Neurological: He is alert and oriented to person, place, and time. He displays no tremor.  Psychiatric: His speech is normal. His mood appears anxious. He is withdrawn. Thought content is paranoid. Cognition and memory are normal. He expresses impulsivity. He expresses no homicidal ideation.    Review of Systems  Constitutional: Negative.  Negative for chills, fever and weight loss.  HENT: Negative.  Negative for congestion, hearing loss and sore throat.   Eyes: Negative.  Negative for blurred vision and double vision.  Respiratory: Negative.  Negative for cough, hemoptysis, sputum production and shortness of breath.   Cardiovascular: Negative.  Negative for chest pain and palpitations.  Gastrointestinal: Negative for abdominal pain, constipation, diarrhea, heartburn, nausea and vomiting.  Genitourinary: Negative.  Negative for dysuria, frequency and urgency.  Musculoskeletal: Positive for neck pain. Negative for back pain, joint pain and myalgias.  Skin: Negative.  Negative for rash.  Neurological: Positive for tremors. Negative for dizziness, tingling, sensory change, speech change and headaches.  Endo/Heme/Allergies: Negative.  Negative for environmental allergies. Does not bruise/bleed easily.   Psychiatric/Behavioral: Positive for depression. Negative for hallucinations, memory loss, substance abuse and suicidal ideas. The patient is nervous/anxious. The patient does not have insomnia.     Blood pressure (!) 114/92, pulse (!) 107, temperature 97.8 F (36.6 C), temperature source Oral, resp. rate 17, height 5\' 9"  (1.753 m), weight 73.9 kg (163 lb), SpO2 100 %.Body mass index is 24.07 kg/m.  General Appearance: Disheveled  Eye Contact:  Good  Speech:  Slow  Volume:  Decreased  Mood:  Dysphoric  Affect:  Blunt  Thought Process:  Linear and Descriptions of Associations: Intact Concrete  Orientation:  NA  Thought Content:  Logical  Suicidal Thoughts:  No  Homicidal Thoughts:  No  Memory:  Immediate;   Fair Recent;   Poor Remote;   Poor  Judgement:  Poor  Insight:  Lacking  Psychomotor Activity:  Normal  Concentration:  Concentration: Fair and Attention Span: Fair  Recall:  Poor  Fund of Knowledge:  Poor  Language:  Fair  Akathisia:  No  Handed:    AIMS (if indicated):     Assets:    ADL's:  Intact  Cognition:  Impaired,  Mild  Sleep:  Number of Hours: 7.15     Treatment Plan Summary: 41 y/o male with depression, SI and hallucinations. Multiple prior hospitalization and prior suicidal attempts. Just d/c last week.  Per review of records patient has history of abusing opiates, Lyrica, Remeron and Flexeril. Here he has been frequently asking for Ativan or clonazepam.  Schizoaffective d/o depressed type: multiple failures to antipsychotics. Pt frequently hospitalized.  -Currently on a trial of Clozaril. I will increase  the dose to 150 mg daily at bedtime -For clozaril augmentation he is on Luvox 50 mg q day -Will decrease latuda to 40 mg and discontinue tomorrow -Effexor will be decreased to 37.5 mg--plan to taper off  Akathisia/TD: Amantadine was discontinued for now. Clozaril should improve tardive dyskinesia  Tremors: I will order propranolol 20 mg twice a  day  Insomnia: Will target insomnia with Clozaril  History of abusing opiates, Flexeril, Lyrica and mirtazapine. We'll benefit from continuing outpatient substance abuse services.   For diabetes: Continue Lantus 39 units daily at bedtime. Continue NovoLog 7 units 3 times a day with meals. Continue metformin 1000 mg twice a day. Will place orders for supplemental insulin. Blood sugars are much better control. He will be maintained on a carbohydrate limited diet.  Hypertension: Continue lisinopril 20 mg a day. For tachycardia and mildly increased blood pressure I will add propanolol 20 mg twice a day  Dyslipidemia continue Zocor 40 mg at bedtime  Asthma continue Singulair 10 mg at bedtime  Neck pain: Continue Neurontin 300 mg 3 times a day and Mobic  whichwasd to 7.5 mg bid.  Will order lidocaine patch to neck. Multiple visits to the ER complaining of neck pain  Constipation: will order miralax daily   Tobacco use disorder: will ordernicotine patch 21 mg a day  Labs: hemoglobin A1c, lipid panel, TSH all completed---we had all of these from Feb 2018  Diet carb modified and low sodium  Disposition:  back to his group home once stable   Follow up: continue to f/u with [facility] in [place]. Continue day program.  Will refer for case coordination   Labs Will monitor ANC and WBC  EKG completed  Consults: will consult DM coordinator and dietician   [name], MD 10/12/2016, 12:39 PM

## 2016-10-12 NOTE — Progress Notes (Signed)
Recreation Therapy Notes  At approximately 1:45 pm, LRT attempted assessment. Patient reported he wanted to sleep.  Jacquelynn Cree, LRT/CTRS 10/12/2016 2:43 PM

## 2016-10-12 NOTE — Progress Notes (Signed)
Inpatient Diabetes Program Recommendations  AACE/ADA: New Consensus Statement on Inpatient Glycemic Control (2015)  Target Ranges:  Prepandial:   less than 140 mg/dL      Peak postprandial:   less than 180 mg/dL (1-2 hours)      Critically ill patients:  140 - 180 mg/dL   Lab Results  Component Value Date   GLUCAP 66 10/12/2016   HGBA1C 11.2 (H) 10/09/2016    Review of Glycemic Control:  Results for TAZ, VANNESS (MRN [id number]) as of 10/12/2016 11:31  Ref. Range 10/11/2016 06:24 10/11/2016 11:45 10/11/2016 16:35 10/11/2016 20:50 10/12/2016 06:42 10/12/2016 11:28  Glucose-Capillary Latest Ref Range: 65 - 99 mg/dL 390 (H) 89 300 (H) 89 923 (H) 66   Inpatient Diabetes Program Recommendations:    Consider increasing Lantus to 42 units q HS and decrease Novolog meal coverage to 5 units tid with meals.  Thanks, Beryl Meager, RN, BC-ADM Inpatient Diabetes Coordinator Pager [phone] (8a-5p)

## 2016-10-12 NOTE — [facility] Group Notes (Signed)
[facility] Group Notes:  (Nursing/MHT/Case Management/Adjunct)  Date:  10/12/2016  Time:  5:33 PM  Type of Therapy:  Psychoeducational Skills  Participation Level:  Did Not Attend  Gunnar Bulla [facility] 10/12/2016, 5:35 PM

## 2016-10-12 NOTE — Progress Notes (Signed)
Pt has again been in his bed all day today, except during meals/med time. He was provided encouragement by this nurse multiple time throughout the day to get out of bed, go to group. Pt was provided clean scrubs/underwear/socks, hygiene products and towels this afternoon with much encouragement to shower provided. Pt reported before dinner that he did take a shower, but appears and smells as if he has not. Pt also reports he put dirty scrubs back on when questioned. Pt with short answers, sometimes delayed. Compliant with diabetic snacks. Counseled on rising out of bed to eat breakfast so that more snacks are not needed to maintain blood glucose. Denies SI/HI/AVH. Continues to complain of neck pain, patch applied and medications administered as ordered. Appears to have a good appetite. Pt is compliant with all medications except he refused Miralax as ordered, states he had a bowel movement yesterday. Support and encouragement provided. Medications administered as ordered with education. Safety maintained with every 15 minute checks. Will continue to montior.

## 2016-10-12 NOTE — [facility] Counselor (Signed)
[facility] Group Notes:  (Nursing/MHT/Case Management/Adjunct)  Date:  10/12/2016  Time:  4:12 AM  Type of Therapy:  Group Therapy  Participation Level:  Minimal  Participation Quality:  Appropriate  Affect:  Appropriate  Cognitive:  Appropriate  Insight:  Appropriate  Engagement in Group:  Limited  Modes of Intervention:  n/a  Summary of Progress/Problems:  Tony Cunningham 10/12/2016, 4:12 AM

## 2016-10-12 NOTE — Progress Notes (Signed)
CBG 66. Pt has been refusing to eat breakfast. Currently eating a snack. No acute distress noted. Safety maintained with every 15 minute checks. Will continue to monitor.

## 2016-10-13 LAB — GLUCOSE, CAPILLARY
GLUCOSE-CAPILLARY: 254 mg/dL — AB (ref 65–99)
Glucose-Capillary: 381 mg/dL — ABNORMAL HIGH (ref 65–99)
Glucose-Capillary: 143 mg/dL — ABNORMAL HIGH (ref 65–99)
Glucose-Capillary: 368 mg/dL — ABNORMAL HIGH (ref 65–99)
Glucose-Capillary: 211 mg/dL — ABNORMAL HIGH (ref 65–99)

## 2016-10-13 MED ORDER — IPRATROPIUM BROMIDE 0.06 % NA SOLN
1.0000 | Freq: Every day | NASAL | Status: DC
  Administered 2016-10-13 – 2016-10-20 (×5): 1 via NASAL
  Filled 2016-10-13: qty 15

## 2016-10-13 MED ORDER — CLOZAPINE 25 MG PO TABS
150.0000 mg | ORAL_TABLET | Freq: Every day | ORAL | Status: DC
  Administered 2016-10-13 – 2016-10-14 (×2): 150 mg via ORAL
  Filled 2016-10-13 (×2): qty 2

## 2016-10-13 MED ORDER — MONTELUKAST SODIUM 10 MG PO TABS
10.0000 mg | ORAL_TABLET | Freq: Every day | ORAL | Status: DC
  Administered 2016-10-13 – 2016-10-20 (×8): 10 mg via ORAL
  Filled 2016-10-13 (×8): qty 1

## 2016-10-13 MED ORDER — INSULIN GLARGINE 100 UNIT/ML ~~LOC~~ SOLN
42.0000 [IU] | Freq: Every day | SUBCUTANEOUS | Status: DC
  Administered 2016-10-13 – 2016-10-19 (×7): 42 [IU] via SUBCUTANEOUS
  Filled 2016-10-13 (×8): qty 0.42

## 2016-10-13 MED ORDER — SIMVASTATIN 40 MG PO TABS
40.0000 mg | ORAL_TABLET | Freq: Every day | ORAL | Status: DC
  Administered 2016-10-13 – 2016-10-20 (×8): 40 mg via ORAL
  Filled 2016-10-13 (×8): qty 1

## 2016-10-13 MED ORDER — FLUVOXAMINE MALEATE 50 MG PO TABS
50.0000 mg | ORAL_TABLET | Freq: Every day | ORAL | Status: DC
  Administered 2016-10-13 – 2016-10-15 (×3): 50 mg via ORAL
  Filled 2016-10-13 (×3): qty 1

## 2016-10-13 NOTE — Progress Notes (Signed)
Patient ID: Tony Cunningham, male   DOB: 1975-08-07, 41 y.o.   MRN: [id number] Poorly maintained, poorly kept appearance, poor hygiene, delayed cognitive response with tardive dyskinesia, CBG monitored and covered on SSI; medication compliant, no aggressive behaviors during this shift. Denies SI/HI, denies AV/H; will continue to encourage patient to take a shower, change scrubs and maintain a good appearance.

## 2016-10-13 NOTE — Progress Notes (Signed)
D: Pt A & O X3. Presents animated but anxious. Speech is logical and soft, responses are delayed. Tremors noted in bilateral hands during medication administration. Denies SI, HI, AVH and pain when assessed.  Pt appears disheveled with body odor, encouraged to shower and change clothes on multiple interactions but to no avail.  A: Emotional support and availability provided. All medications administered as per physician's orders, effects monitored. Verbal education done on appropriate diabetic snacks as pt was found eating nutrigrain bar. Q 15 minutes safety checks maintained without self harm gestures or outburst to report thus far. R: Pt remains medication compliant. Denies adverse drug reactions. verbalized understanding related to meals /snacks. Tolerates all PO intake well. Did not attend groups this shift. Will continue pt for safety and mood stability.

## 2016-10-13 NOTE — Plan of Care (Signed)
Problem: Nutrition: Goal: Adequate nutrition will be maintained Outcome: Progressing Pt tolerates all PO intake well. Required verbal education on his current diet and compliance  (Diabetic) this shift. Understanding verbalized "ok".  Problem: Medication: Goal: Compliance with prescribed medication regimen will improve Outcome: Progressing Pt has been medication compliant. Denies adverse drug reactions when assessed.

## 2016-10-13 NOTE — [facility] Group Notes (Signed)
[facility] LCSW Group Therapy Note  Date/Time: 10/13/2016, 2:00 PM  Type of Therapy/Topic:  Group Therapy:  Feelings about Diagnosis  Participation Level:  Did Not Attend     Therapeutic Modalities:   Cognitive Behavioral Therapy Brief Therapy Feelings Identification   Hampton Abbot, MSW, LCSW-A 10/13/2016, 3:11 PM

## 2016-10-13 NOTE — Progress Notes (Signed)
Recreation Therapy Notes  Date: 05.22.18 Time: 9:30 am Location: Craft Room  Group Topic: Goal Setting  Goal Area(s) Addresses:  Patient will write at least one goal. Patient will write at least one obstacle.  Behavioral Response: Did not attend   Intervention: Recovery Goal Chart  Activity: Patients were instructed to make a Recovery Goal Chart including their goals, obstacles, the date they started working on their goal, and the date they achieved their goal.  Education: LRT educated patients on healthy ways to celebrate reaching their goals.  Education Outcome: Patient did not attend group.  Clinical Observations/Feedback: Patient did not attend group.  Jacquelynn Cree, LRT/CTRS 10/13/2016 10:10 AM

## 2016-10-13 NOTE — Progress Notes (Signed)
   Inpatient Diabetes Program Recommendations  AACE/ADA: New Consensus Statement on Inpatient Glycemic Control (2015)  Target Ranges:  Prepandial:   less than 140 mg/dL      Peak postprandial:   less than 180 mg/dL (1-2 hours)      Critically ill patients:  140 - 180 mg/dL   Lab Results  Component Value Date   GLUCAP 143 (H) 10/13/2016   HGBA1C 11.2 (H) 10/09/2016    Review of Glycemic ControlResults for JAKYRI, BRUNKHORST (MRN [id number]) as of 10/13/2016 10:50  Ref. Range 10/12/2016 12:17 10/12/2016 16:07 10/12/2016 20:25 10/13/2016 01:48 10/13/2016 06:57  Glucose-Capillary Latest Ref Range: 65 - 99 mg/dL 951 (H) 884 (H) 166 (H) 381 (H) 143 (H)    Inpatient Diabetes Program Recommendations:   Consider increasing Lantus to 42 units q HS and decrease Novolog meal coverage to 5 units tid with meals.  Thanks, Beryl Meager, RN, BC-ADM Inpatient Diabetes Coordinator Pager [phone] (8a-5p)

## 2016-10-13 NOTE — Progress Notes (Signed)
[facility] MD Progress Note  10/13/2016 8:33 AM Tony Cunningham  MRN:  [id number]  Subjective:   41 year old single male with history of schizoaffective disorder depressed type patient came voluntarily to our emergency department in the company of mobile crisis unit. He was complaining of hearing voices which were commanding him to harm himself. Patient reported that he was thinking about killing himself by running into traffic or drowning himself.  10/10/16:The patient has been isolative to his room this morning and had minimal interaction with staff and peers. He did eventually come out of his room for a little while but did not interact with peers. He was feeling very lethargic and tired this morning. He denies any current active or passive suicidal thoughts or psychotic symptoms. He says he does not want to go back to the group home because the other residents there are mean to him. He did not answer all questions asked that she was falling asleep and lethargic during the visit. He has been compliant with psychotropic medications. He slept over 8 hours last night. Blood pressure was slightly elevated this morning. Blood sugars have become more controlled compared to the time of admission.  10/11/16:  The patient reports that his anxiety has decreased with the Clozaril and that is the only positive changes noticed with the medication. He is not as sedated or tired this morning compared to yesterday. He did get up for breakfast but otherwise has been isolative to his room. He did not go outside with the other patients and has not been attending groups. The patient gave very refer responses to questions asked. He denies any current active or passive suicidal thoughts or hallucinations. He denied paranoid thoughts but did express paranoid thoughts about the group home and people being upset with him for calling the ambulance. He denies any somatic complaints. Blood sugars have been elevated. Per nursing, he slept  7.45 hours last night but has been sleeping intermittently throughout the daytime. Vital signs are stable.  5/21 patient has been withdrawn to his room. Minimal participation in programming. Tells me he is not suicidal, homicidal and does not have hallucinations. He reports feeling well denies problems with appetite, energy or concentration. Says that  he is not sleeping well throughout the night. Nurses told me he has been is sleeping all throughout the day. Patient denies lightheadedness, constipation, drooling or dizziness.  5/22 withdrawn to his room. Sleeping during the daytime. Not going to groups. Poor hygiene and grooming. He denies having any lightheadedness, dizziness, constipation or chest pain. He does complain of drooling. He denies suicidality, homicidality or hallucinations. He says that he is having thoughts about pushing his mother but he will never do that because she is his angel. Patient makes some statements that appeared to be unrelated to the questions. This is not sleeping well Dimas Aguas is sleeping a lot throughout the day). He does complain of some mild sedation. During assessment he did not appear sedated  Per nursing: Poorly maintained, poorly kept appearance, poor hygiene, delayed cognitive response with tardive dyskinesia, CBG monitored and covered on SSI; medication compliant, no aggressive behaviors during this shift. Denies SI/HI, denies AV/H; will continue to encourage patient to take a shower, change scrubs and maintain a good appearance.  Principal Problem: Schizoaffective disorder, depressive type (HCC) Diagnosis:   Patient Active Problem List   Diagnosis Date Noted  . Schizoaffective disorder, depressive type (HCC) [F25.1] 09/23/2016  . Polysubstance abuse [F19.10] 07/17/2016  . Tardive dyskinesia [G24.01] 07/16/2016  .  Tobacco use disorder [F17.200] 07/07/2016  . Dyslipidemia [E78.5] 07/07/2016  . Asthma [J45.909] 07/07/2016  . HTN (hypertension) [I10]  07/06/2016  . Diabetes (HCC) [E11.9] 12/25/2010   Total Time spent with patient: 30 minutes  Past Psychiatric History: Patient has a long history of mental illness and has had multiple hospitalizations including multiple hospitalizations in [place]. Most of those records are unavailable to me. 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 Not sure whether he ever returns to an adequate baseline. He tells me that he has tried to cut himself and kill himself in the past. Denies any history of violence. He can't remember the names of any of his medicines. Outpatient medicines listed on admission included Abilify and Effexor. Also hospitalized at [facility], [place].  Past Medical History:  Past Medical History:  Diagnosis Date  . Anxiety   . Asthma   . Diabetes mellitus   . High blood pressure   . Sinus complaint    History reviewed. No pertinent surgical history.   Family History: History reviewed. No pertinent family history.   Family Psychiatric  History: None known   Social History: Living in a group home. Has been there about 4-6 months he estimates. Says that he does not like it. 11 grade education. Does not have a legal guardian  History  Alcohol Use No     History  Drug Use No    Social History   Social History  . Marital status: Single    Spouse name: [name]  . Number of children: [name]  . Years of education: [name]   Social History Main Topics  . Smoking status: Current Every Day Smoker    Packs/day: 0.50    Types: Cigarettes  . Smokeless tobacco: Never Used  . Alcohol use No  . Drug use: No  . Sexual activity: Not Currently   Other Topics Concern  . None   Social History Narrative  . None     Current Medications: Current Facility-Administered Medications  Medication Dose Route Frequency Provider Last Rate Last Dose  . acetaminophen (TYLENOL) tablet 650 mg  650 mg Oral Q6H PRN [name], [name], MD    650 mg at 10/13/16 0156  . alum & mag hydroxide-simeth (MAALOX/MYLANTA) 200-200-20 MG/5ML suspension 30 mL  30 mL Oral Q4H PRN [name], [name], MD      . cloZAPine (CLOZARIL) tablet 150 mg  150 mg Oral QHS [name], MD   150 mg at 10/12/16 2126  . fluvoxaMINE (LUVOX) tablet 50 mg  50 mg Oral QHS [name], MD   50 mg at 10/12/16 2126  . gabapentin (NEURONTIN) capsule 300 mg  300 mg Oral TID [name], [name], MD   300 mg at 10/12/16 1642  . insulin aspart (novoLOG) injection 0-15 Units  0-15 Units Subcutaneous TID WC [name], MD   3 Units at 10/12/16 1644  . insulin aspart (novoLOG) injection 0-5 Units  0-5 Units Subcutaneous QHS [name], MD   5 Units at 10/13/16 0154  . insulin aspart (novoLOG) injection 7 Units  7 Units Subcutaneous TID WC [name], [name], MD   7 Units at 10/12/16 1643  . insulin glargine (LANTUS) injection 39 Units  39 Units Subcutaneous QHS [name], [name], MD   39 Units at 10/12/16 2125  . lidocaine (LIDODERM) 5 % 1 patch  1 patch Transdermal Q24H [name], MD   1 patch at 10/12/16 1222  .  lisinopril (PRINIVIL,ZESTRIL) tablet 20 mg  20 mg Oral Daily [name], [name], MD   20 mg at 10/12/16 0802  . magnesium hydroxide (MILK OF MAGNESIA) suspension 30 mL  30 mL Oral Daily PRN [name], [name], MD      . meloxicam (MOBIC) tablet 7.5 mg  7.5 mg Oral BID [name], MD   7.5 mg at 10/12/16 1642  . metFORMIN (GLUCOPHAGE) tablet 1,000 mg  1,000 mg Oral BID WC [name], [name], MD   1,000 mg at 10/12/16 1641  . montelukast (SINGULAIR) tablet 10 mg  10 mg Oral QHS [name], [name], MD   10 mg at 10/12/16 2126  . nicotine (NICODERM CQ - dosed in mg/24 hours) patch 21 mg  21 mg Transdermal Daily [name], MD   21 mg at 10/12/16 0805  . polyethylene glycol (MIRALAX / GLYCOLAX) packet 17 g  17 g Oral Daily [name], MD      . propranolol (INDERAL) tablet  20 mg  20 mg Oral BID [name], MD   20 mg at 10/12/16 1824  . simvastatin (ZOCOR) tablet 40 mg  40 mg Oral QHS [name], [name], MD   40 mg at 10/12/16 2126  . venlafaxine XR (EFFEXOR-XR) 24 hr capsule 37.5 mg  37.5 mg Oral Q breakfast [name], MD        Lab Results:  Results for orders placed or performed during the hospital encounter of 10/08/16 (from the past 48 hour(s))  Glucose, capillary     Status: None   Collection Time: 10/11/16 11:45 AM  Result Value Ref Range   Glucose-Capillary 89 65 - 99 mg/dL   Comment 1 Notify RN   Glucose, capillary     Status: Abnormal   Collection Time: 10/11/16  4:35 PM  Result Value Ref Range   Glucose-Capillary 119 (H) 65 - 99 mg/dL  Glucose, capillary     Status: None   Collection Time: 10/11/16  8:50 PM  Result Value Ref Range   Glucose-Capillary 89 65 - 99 mg/dL  Glucose, capillary     Status: Abnormal   Collection Time: 10/12/16  6:42 AM  Result Value Ref Range   Glucose-Capillary 287 (H) 65 - 99 mg/dL  Glucose, capillary     Status: None   Collection Time: 10/12/16 11:28 AM  Result Value Ref Range   Glucose-Capillary 66 65 - 99 mg/dL  Glucose, capillary     Status: None   Collection Time: 10/12/16 11:44 AM  Result Value Ref Range   Glucose-Capillary 67 65 - 99 mg/dL  Glucose, capillary     Status: Abnormal   Collection Time: 10/12/16 12:17 PM  Result Value Ref Range   Glucose-Capillary 133 (H) 65 - 99 mg/dL  Glucose, capillary     Status: Abnormal   Collection Time: 10/12/16  4:07 PM  Result Value Ref Range   Glucose-Capillary 189 (H) 65 - 99 mg/dL  Glucose, capillary     Status: Abnormal   Collection Time: 10/12/16  8:25 PM  Result Value Ref Range   Glucose-Capillary 269 (H) 65 - 99 mg/dL  Glucose, capillary     Status: Abnormal   Collection Time: 10/13/16  1:48 AM  Result Value Ref Range   Glucose-Capillary 381 (H) 65 - 99 mg/dL  Glucose, capillary     Status: Abnormal   Collection Time:  10/13/16  6:57 AM  Result Value Ref Range   Glucose-Capillary 143 (H) 65 - 99 mg/dL  Blood Alcohol level:  Lab Results  Component Value Date   ETH <5 10/07/2016   ETH <5 09/23/2016    Metabolic Disorder Labs: Lab Results  Component Value Date   HGBA1C 11.2 (H) 10/09/2016   MPG 275 10/09/2016   MPG 275 09/24/2016   Lab Results  Component Value Date   PROLACTIN 24.5 (H) 09/24/2016   PROLACTIN 3.4 (L) 07/07/2016   Lab Results  Component Value Date   CHOL 127 10/09/2016   TRIG 80 10/09/2016   HDL 39 (L) 10/09/2016   CHOLHDL 3.3 10/09/2016   VLDL 16 10/09/2016   LDLCALC 72 10/09/2016   LDLCALC 56 07/07/2016     Musculoskeletal: Strength & Muscle Tone: within normal limits Gait & Station: normal Patient leans: [name]  Psychiatric Specialty Exam: Physical Exam  Nursing note and vitals reviewed. Constitutional: He is oriented to person, place, and time. He appears well-developed and well-nourished.  HENT:  Head: Normocephalic and atraumatic.  Eyes: Conjunctivae and EOM are normal.  Neck: Normal range of motion.  Respiratory: Effort normal.  Neurological: He is alert and oriented to person, place, and time. He displays no tremor.    Review of Systems  Constitutional: Negative.  Negative for chills, fever and weight loss.  HENT: Negative.  Negative for congestion, hearing loss and sore throat.   Eyes: Negative.  Negative for blurred vision and double vision.  Respiratory: Negative.  Negative for cough, hemoptysis, sputum production and shortness of breath.   Cardiovascular: Negative.  Negative for chest pain and palpitations.  Gastrointestinal: Negative for abdominal pain, constipation, diarrhea, heartburn, nausea and vomiting.  Genitourinary: Negative.  Negative for dysuria, frequency and urgency.  Musculoskeletal: Positive for neck pain. Negative for back pain, joint pain and myalgias.  Skin: Negative.  Negative for rash.  Neurological: Positive for tremors.  Negative for dizziness, tingling, sensory change, speech change and headaches.  Endo/Heme/Allergies: Negative.  Negative for environmental allergies. Does not bruise/bleed easily.  Psychiatric/Behavioral: Positive for depression. Negative for hallucinations, memory loss, substance abuse and suicidal ideas. The patient is nervous/anxious. The patient does not have insomnia.     Blood pressure 114/77, pulse 90, temperature 97.9 F (36.6 C), resp. rate 18, height 5\' 9"  (1.753 m), weight 73.9 kg (163 lb), SpO2 100 %.Body mass index is 24.07 kg/m.  General Appearance: Disheveled  Eye Contact:  Good  Speech:  Slow  Volume:  Decreased  Mood:  Dysphoric  Affect:  Blunt  Thought Process:  Linear and Descriptions of Associations: Intact Concrete  Orientation:  NA  Thought Content:  Logical  Suicidal Thoughts:  No  Homicidal Thoughts:  No  Memory:  Immediate;   Fair Recent;   Poor Remote;   Poor  Judgement:  Poor  Insight:  Lacking  Psychomotor Activity:  Normal  Concentration:  Concentration: Fair and Attention Span: Fair  Recall:  Poor  Fund of Knowledge:  Poor  Language:  Fair  Akathisia:  No  Handed:    AIMS (if indicated):     Assets:    ADL's:  Intact  Cognition:  Impaired,  Mild  Sleep:  Number of Hours: 5.45     Treatment Plan Summary: 41 y/o male with depression, SI and hallucinations. Multiple prior hospitalization and prior suicidal attempts. Just d/c last week.  Per review of records patient has history of abusing opiates, Lyrica, Remeron and Flexeril. Here he has been frequently asking for Ativan or clonazepam.  Schizoaffective d/o depressed type: multiple failures to antipsychotics. Pt frequently hospitalized.  -  Currently on a trial of Clozaril. Continue 150 mg daily at 1700.  Will check clozaril level on Friday. -For clozaril augmentation he is on Luvox 50 mg at 1700 -Latuda has been taper off -Effexor will be decreased to 37.5 mg--plan to taper  off  Sialorrhea: will start atrovent sublingual at bedtime  Akathisia/TD: Amantadine was discontinued for now. Clozaril should improve tardive dyskinesia  Tremors: continue propranolol 20 mg twice a day  Insomnia: Will target insomnia with Clozaril  History of abusing opiates, Flexeril, Lyrica and mirtazapine. We'll benefit from continuing outpatient substance abuse services.   For diabetes: Continue Lantus 39 units daily at bedtime. Continue NovoLog 7 units 3 times a day with meals. Continue metformin 1000 mg twice a day. Will place orders for supplemental insulin. Blood sugars are much better control. He will be maintained on a carbohydrate limited diet.  Hypertension: Continue lisinopril 20 mg a day and propanolol 20 mg twice a day.  BP and HR wnl  Dyslipidemia continue Zocor 40 mg at 1700  Asthma continue Singulair 10 mg q daily  Neck pain: Continue Neurontin 300 mg 3 times a day and Mobic 7.5 mg bid. Continue lidocaine patch to neck. Multiple visits to the ER complaining of neck pain  Constipation:continue miralax daily   Tobacco use disorder: continue nicotine patch 21 mg a day  Labs: hemoglobin A1c, lipid panel, TSH all completed---we had all of these from Feb 2018  Diet carb modified and low sodium  Disposition:  back to his group home once stable   Follow up: continue to f/u with [facility] in [place]. Continue day program.  Will refer for case coordination   Labs: Plan to order clozaril level on Friday night.  Plan to discharge if levels are therapeutic.  EKG completed  Consults: consulted DM coordinator, dietician and PT  Likely will be d/c early next week   [name], MD 10/13/2016, 8:33 AM

## 2016-10-13 NOTE — Plan of Care (Signed)
Problem: Activity: Goal: Sleeping patterns will improve Outcome: Progressing Patient slept for Estimated Hours of 5.45; every 15 minutes safety round maintained, no injury or falls during this shift.    

## 2016-10-14 LAB — GLUCOSE, CAPILLARY
GLUCOSE-CAPILLARY: 101 mg/dL — AB (ref 65–99)
GLUCOSE-CAPILLARY: 55 mg/dL — AB (ref 65–99)
Glucose-Capillary: 132 mg/dL — ABNORMAL HIGH (ref 65–99)
Glucose-Capillary: 160 mg/dL — ABNORMAL HIGH (ref 65–99)
Glucose-Capillary: 98 mg/dL (ref 65–99)
Glucose-Capillary: 159 mg/dL — ABNORMAL HIGH (ref 65–99)

## 2016-10-14 MED ORDER — GLUCOSE 40 % PO GEL
1.0000 | Freq: Once | ORAL | Status: DC | PRN
Start: 2016-10-14 — End: 2016-10-20
  Filled 2016-10-14: qty 1

## 2016-10-14 MED ORDER — INSULIN ASPART 100 UNIT/ML ~~LOC~~ SOLN
5.0000 [IU] | Freq: Three times a day (TID) | SUBCUTANEOUS | Status: DC
  Administered 2016-10-14 – 2016-10-20 (×16): 5 [IU] via SUBCUTANEOUS
  Filled 2016-10-14 (×10): qty 5

## 2016-10-14 NOTE — Plan of Care (Signed)
Problem: Activity: Goal: Imbalance in normal sleep/wake cycle will improve Outcome: Not Progressing Falls asleep during meals and anytime sits still.

## 2016-10-14 NOTE — [facility] Group Notes (Signed)
[facility] Group Notes:  (Nursing/MHT/Case Management/Adjunct)  Date:  10/14/2016  Time:  4:48 PM  Type of Therapy:  Psychoeducational Skills  Participation Level:  Did Not Attend  Twanna Hy 10/14/2016, 4:48 PM

## 2016-10-14 NOTE — Progress Notes (Signed)
[facility] MD Progress Note  10/14/2016 8:44 AM Tony Cunningham  MRN:  [id number]  Subjective:   41 year old single male with history of schizoaffective disorder depressed type patient came voluntarily to our emergency department in the company of mobile crisis unit. He was complaining of hearing voices which were commanding him to harm himself. Patient reported that he was thinking about killing himself by running into traffic or drowning himself.  10/10/16:The patient has been isolative to his room this morning and had minimal interaction with staff and peers. He did eventually come out of his room for a little while but did not interact with peers. He was feeling very lethargic and tired this morning. He denies any current active or passive suicidal thoughts or psychotic symptoms. He says he does not want to go back to the group home because the other residents there are mean to him. He did not answer all questions asked that she was falling asleep and lethargic during the visit. He has been compliant with psychotropic medications. He slept over 8 hours last night. Blood pressure was slightly elevated this morning. Blood sugars have become more controlled compared to the time of admission.  10/11/16:  The patient reports that his anxiety has decreased with the Clozaril and that is the only positive changes noticed with the medication. He is not as sedated or tired this morning compared to yesterday. He did get up for breakfast but otherwise has been isolative to his room. He did not go outside with the other patients and has not been attending groups. The patient gave very refer responses to questions asked. He denies any current active or passive suicidal thoughts or hallucinations. He denied paranoid thoughts but did express paranoid thoughts about the group home and people being upset with him for calling the ambulance. He denies any somatic complaints. Blood sugars have been elevated. Per nursing, he slept  7.45 hours last night but has been sleeping intermittently throughout the daytime. Vital signs are stable.  5/21 patient has been withdrawn to his room. Minimal participation in programming. Tells me he is not suicidal, homicidal and does not have hallucinations. He reports feeling well denies problems with appetite, energy or concentration. Says that  he is not sleeping well throughout the night. Nurses told me he has been is sleeping all throughout the day. Patient denies lightheadedness, constipation, drooling or dizziness.  5/22 withdrawn to his room. Sleeping during the daytime. Not going to groups. Poor hygiene and grooming. He denies having any lightheadedness, dizziness, constipation or chest pain. He does complain of drooling. He denies suicidality, homicidality or hallucinations. He says that he is having thoughts about pushing his mother but he will never do that because she is his angel. Patient makes some statements that appeared to be unrelated to the questions. This is not sleeping well Dimas Aguas is sleeping a lot throughout the day). He does complain of some mild sedation. During assessment he did not appear sedated  5/23 without change. He is not participating in any groups. He is hygiene and grooming are poor. She stays in bed all day on him in his room for meals. He denies to me having any dizziness, lightheadedness, chest pain, constipation. He does complain of feeling a little bit tired. Denies suicidality, homicidality or auditory or visual hallucinations.   Per nursing: Poorly maintained, poorly kept appearance, poor hygiene, delayed cognitive response with tardive dyskinesia, CBG monitored and covered on SSI; medication compliant, no aggressive behaviors during this shift. Denies SI/HI, denies  AV/H; will continue to encourage patient to take a shower, change scrubs and maintain a good appearance.  Principal Problem: Schizoaffective disorder, depressive type (HCC) Diagnosis:    Patient Active Problem List   Diagnosis Date Noted  . Schizoaffective disorder, depressive type (HCC) [F25.1] 09/23/2016  . Polysubstance abuse [F19.10] 07/17/2016  . Tardive dyskinesia [G24.01] 07/16/2016  . Tobacco use disorder [F17.200] 07/07/2016  . Dyslipidemia [E78.5] 07/07/2016  . Asthma [J45.909] 07/07/2016  . HTN (hypertension) [I10] 07/06/2016  . Diabetes (HCC) [E11.9] 12/25/2010   Total Time spent with patient: 30 minutes  Past Psychiatric History: Patient has a long history of mental illness and has had multiple hospitalizations including multiple hospitalizations in [place]. Most of those records are unavailable to me. 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 Not sure whether he ever returns to an adequate baseline. He tells me that he has tried to cut himself and kill himself in the past. Denies any history of violence. He can't remember the names of any of his medicines. Outpatient medicines listed on admission included Abilify and Effexor. Also hospitalized at [facility], [place].  Past Medical History:  Past Medical History:  Diagnosis Date  . Anxiety   . Asthma   . Diabetes mellitus   . High blood pressure   . Sinus complaint    History reviewed. No pertinent surgical history.   Family History: History reviewed. No pertinent family history.   Family Psychiatric  History: None known   Social History: Living in a group home. Has been there about 4-6 months he estimates. Says that he does not like it. 11 grade education. Does not have a legal guardian  History  Alcohol Use No     History  Drug Use No    Social History   Social History  . Marital status: Single    Spouse name: [name]  . Number of children: [name]  . Years of education: [name]   Social History Main Topics  . Smoking status: Current Every Day Smoker    Packs/day: 0.50    Types: Cigarettes  . Smokeless tobacco: Never Used  .  Alcohol use No  . Drug use: No  . Sexual activity: Not Currently   Other Topics Concern  . None   Social History Narrative  . None     Current Medications: Current Facility-Administered Medications  Medication Dose Route Frequency Provider Last Rate Last Dose  . acetaminophen (TYLENOL) tablet 650 mg  650 mg Oral Q6H PRN [name], [name], MD   650 mg at 10/13/16 0156  . alum & mag hydroxide-simeth (MAALOX/MYLANTA) 200-200-20 MG/5ML suspension 30 mL  30 mL Oral Q4H PRN [name], [name], MD      . cloZAPine (CLOZARIL) tablet 150 mg  150 mg Oral QHS [name], MD   150 mg at 10/13/16 1744  . fluvoxaMINE (LUVOX) tablet 50 mg  50 mg Oral QHS [name], MD   50 mg at 10/13/16 1744  . gabapentin (NEURONTIN) capsule 300 mg  300 mg Oral TID [name], [name], MD   300 mg at 10/14/16 5277  . insulin aspart (novoLOG) injection 0-15 Units  0-15 Units Subcutaneous TID WC [name], MD   3 Units at 10/14/16 [phone]  . insulin aspart (novoLOG) injection 0-5 Units  0-5 Units Subcutaneous QHS [name], MD   3 Units at 10/13/16 2203  . insulin aspart (novoLOG) injection 7 Units  7 Units Subcutaneous TID  WC [name], [name], MD   7 Units at 10/14/16 [phone]  . insulin glargine (LANTUS) injection 42 Units  42 Units Subcutaneous QHS [name], MD   42 Units at 10/13/16 2203  . ipratropium (ATROVENT) 0.06 % nasal spray 1 spray  1 spray Each Nare QPC supper [name], MD   1 spray at 10/13/16 1745  . lidocaine (LIDODERM) 5 % 1 patch  1 patch Transdermal Q24H [name], MD   1 patch at 10/12/16 1222  . lisinopril (PRINIVIL,ZESTRIL) tablet 20 mg  20 mg Oral Daily [name], [name], MD   20 mg at 10/14/16 5093  . magnesium hydroxide (MILK OF MAGNESIA) suspension 30 mL  30 mL Oral Daily PRN [name], [name], MD      . meloxicam (MOBIC) tablet 7.5 mg  7.5 mg Oral BID [name], MD   7.5 mg at  10/14/16 2671  . metFORMIN (GLUCOPHAGE) tablet 1,000 mg  1,000 mg Oral BID WC [name], [name], MD   1,000 mg at 10/14/16 2458  . montelukast (SINGULAIR) tablet 10 mg  10 mg Oral Daily [name], MD   10 mg at 10/14/16 0998  . nicotine (NICODERM CQ - dosed in mg/24 hours) patch 21 mg  21 mg Transdermal Daily [name], MD   21 mg at 10/14/16 0839  . polyethylene glycol (MIRALAX / GLYCOLAX) packet 17 g  17 g Oral Daily [name], MD   17 g at 10/14/16 3382  . propranolol (INDERAL) tablet 20 mg  20 mg Oral BID [name], MD   20 mg at 10/14/16 5053  . simvastatin (ZOCOR) tablet 40 mg  40 mg Oral QHS [name], MD   40 mg at 10/13/16 1744  . venlafaxine XR (EFFEXOR-XR) 24 hr capsule 37.5 mg  37.5 mg Oral Q breakfast [name], MD   37.5 mg at 10/14/16 9767    Lab Results:  Results for orders placed or performed during the hospital encounter of 10/08/16 (from the past 48 hour(s))  Glucose, capillary     Status: None   Collection Time: 10/12/16 11:28 AM  Result Value Ref Range   Glucose-Capillary 66 65 - 99 mg/dL  Glucose, capillary     Status: None   Collection Time: 10/12/16 11:44 AM  Result Value Ref Range   Glucose-Capillary 67 65 - 99 mg/dL  Glucose, capillary     Status: Abnormal   Collection Time: 10/12/16 12:17 PM  Result Value Ref Range   Glucose-Capillary 133 (H) 65 - 99 mg/dL  Glucose, capillary     Status: Abnormal   Collection Time: 10/12/16  4:07 PM  Result Value Ref Range   Glucose-Capillary 189 (H) 65 - 99 mg/dL  Glucose, capillary     Status: Abnormal   Collection Time: 10/12/16  8:25 PM  Result Value Ref Range   Glucose-Capillary 269 (H) 65 - 99 mg/dL  Glucose, capillary     Status: Abnormal   Collection Time: 10/13/16  1:48 AM  Result Value Ref Range   Glucose-Capillary 381 (H) 65 - 99 mg/dL  Glucose, capillary     Status: Abnormal   Collection Time: 10/13/16  6:57  AM  Result Value Ref Range   Glucose-Capillary 143 (H) 65 - 99 mg/dL  Glucose, capillary     Status: Abnormal   Collection Time: 10/13/16 11:27 AM  Result Value Ref Range   Glucose-Capillary 368 (H) 65 - 99 mg/dL   Comment 1 Notify RN   Glucose, capillary  Status: Abnormal   Collection Time: 10/13/16  4:56 PM  Result Value Ref Range   Glucose-Capillary 211 (H) 65 - 99 mg/dL   Comment 1 Notify RN   Glucose, capillary     Status: Abnormal   Collection Time: 10/13/16  8:26 PM  Result Value Ref Range   Glucose-Capillary 254 (H) 65 - 99 mg/dL  Glucose, capillary     Status: Abnormal   Collection Time: 10/14/16  6:46 AM  Result Value Ref Range   Glucose-Capillary 132 (H) 65 - 99 mg/dL  Glucose, capillary     Status: Abnormal   Collection Time: 10/14/16  8:34 AM  Result Value Ref Range   Glucose-Capillary 160 (H) 65 - 99 mg/dL   Comment 1 Document in Chart     Blood Alcohol level:  Lab Results  Component Value Date   ETH <5 10/07/2016   ETH <5 09/23/2016    Metabolic Disorder Labs: Lab Results  Component Value Date   HGBA1C 11.2 (H) 10/09/2016   MPG 275 10/09/2016   MPG 275 09/24/2016   Lab Results  Component Value Date   PROLACTIN 24.5 (H) 09/24/2016   PROLACTIN 3.4 (L) 07/07/2016   Lab Results  Component Value Date   CHOL 127 10/09/2016   TRIG 80 10/09/2016   HDL 39 (L) 10/09/2016   CHOLHDL 3.3 10/09/2016   VLDL 16 10/09/2016   LDLCALC 72 10/09/2016   LDLCALC 56 07/07/2016     Musculoskeletal: Strength & Muscle Tone: within normal limits Gait & Station: normal Patient leans: [name]  Psychiatric Specialty Exam: Physical Exam  Nursing note and vitals reviewed. Constitutional: He is oriented to person, place, and time. He appears well-developed and well-nourished.  HENT:  Head: Normocephalic and atraumatic.  Eyes: Conjunctivae and EOM are normal.  Neck: Normal range of motion.  Respiratory: Effort normal.  Neurological: He is alert and oriented to  person, place, and time. He displays no tremor.    Review of Systems  Constitutional: Negative.  Negative for chills, fever and weight loss.  HENT: Negative.  Negative for congestion, hearing loss and sore throat.   Eyes: Negative.  Negative for blurred vision and double vision.  Respiratory: Negative.  Negative for cough, hemoptysis, sputum production and shortness of breath.   Cardiovascular: Negative.  Negative for chest pain and palpitations.  Gastrointestinal: Negative for abdominal pain, constipation, diarrhea, heartburn, nausea and vomiting.  Genitourinary: Negative.  Negative for dysuria, frequency and urgency.  Musculoskeletal: Positive for neck pain. Negative for back pain, joint pain and myalgias.  Skin: Negative.  Negative for rash.  Neurological: Positive for tremors. Negative for dizziness, tingling, sensory change, speech change and headaches.  Endo/Heme/Allergies: Negative.  Negative for environmental allergies. Does not bruise/bleed easily.  Psychiatric/Behavioral: Positive for depression. Negative for hallucinations, memory loss, substance abuse and suicidal ideas. The patient is nervous/anxious. The patient does not have insomnia.     Blood pressure 102/66, pulse 89, temperature 97.9 F (36.6 C), resp. rate 18, height 5\' 9"  (1.753 m), weight 73.9 kg (163 lb), SpO2 100 %.Body mass index is 24.07 kg/m.  General Appearance: Disheveled  Eye Contact:  Good  Speech:  Slow  Volume:  Decreased  Mood:  Dysphoric  Affect:  Blunt  Thought Process:  Linear and Descriptions of Associations: Intact Concrete  Orientation:  NA  Thought Content:  Logical  Suicidal Thoughts:  No  Homicidal Thoughts:  No  Memory:  Immediate;   Fair Recent;   Poor Remote;   Poor  Judgement:  Poor  Insight:  Lacking  Psychomotor Activity:  Normal  Concentration:  Concentration: Fair and Attention Span: Fair  Recall:  Poor  Fund of Knowledge:  Poor  Language:  Fair  Akathisia:  No  Handed:     AIMS (if indicated):     Assets:    ADL's:  Intact  Cognition:  Impaired,  Mild  Sleep:  Number of Hours: 7     Treatment Plan Summary: 41 y/o male with depression, SI and hallucinations. Multiple prior hospitalization and prior suicidal attempts. Just d/c last week.  Per review of records patient has history of abusing opiates, Lyrica, Remeron and Flexeril. Here he has been frequently asking for Ativan or clonazepam.  Schizoaffective d/o depressed type: multiple failures to antipsychotics. Pt frequently hospitalized.  -Currently on a trial of Clozaril. Continue 150 mg daily at 1700.  Will check clozaril level This evening as he has been withdrawn to his room and appears somewhat sedated -For clozaril augmentation he is on Luvox 50 mg at 1700 -Latuda has been taper off -Effexor will be decreased to 37.5 mg--will discontinue today  Sialorrhea: Continue atrovent sublingual at bedtime  Akathisia/TD: Amantadine was discontinued for now. Clozaril should improve tardive dyskinesia  Tremors: continue propranolol 20 mg twice a day  Insomnia: Will target insomnia with Clozaril--- sleeping well  History of abusing opiates, Flexeril, Lyrica and mirtazapine. We'll benefit from continuing outpatient substance abuse services.   For diabetes: Continue Lantus 42 units daily at bedtime. Continue NovoLog but will decrease to 5 units 3 times a day with meals. Continue metformin 1000 mg twice a day. Will place orders for supplemental insulin. Bloodglucose are much better control. He will be maintained on a carbohydrate limited diet.  Hypertension: Continue lisinopril 20 mg a day and propanolol 20 mg twice a day.  BP and HR wnl  Dyslipidemia continue Zocor 40 mg at 1700  Asthma continue Singulair 10 mg q daily  Neck pain: Continue Neurontin 300 mg 3 times a day and Mobic 7.5 mg bid. Continue lidocaine patch to neck. Multiple visits to the ER complaining of neck  pain  Constipation:continue miralax daily   Tobacco use disorder: continue nicotine patch 21 mg a day  Labs: hemoglobin A1c, lipid panel, TSH all completed---we had all of these from Feb 2018  Diet carb modified and low sodium  Disposition:  back to his group home once stable   Follow up: continue to f/u with [facility] in [place]. Continue day program.  Will refer for case coordination   Labs: Plan to order clozaril level tonight  EKG completed  Consults: consulted DM coordinator, dietician and PT  Likely will be d/c early next week   [name], MD 10/14/2016, 8:44 AM

## 2016-10-14 NOTE — Plan of Care (Signed)
Problem: Coping: Goal: Ability to verbalize feelings will improve Outcome: Progressing Patient verbalized feelings to staff.    

## 2016-10-14 NOTE — [facility] Group Notes (Signed)
May 23/2018   Type of Therapy:  Group Therapy Emotional Regulation Participation Level:  Active  Participation Quality:  Attentive  Affect:  Appropriate  Cognitive:  Appropriate  Insight:  Engaged  Engagement in Therapy:  Developing/Improving  Modes of Intervention:  Activity, Clarification, Discussion, Education, Exploration and Socialization   The purpose of this group is to assist patients in learning to regulate negative emotions and experience positive emotions.  Patients will be guided to discuss ways in which they have been vulnerable to their negative emotions.  These vulnerabilities will be juxtaposed with experiences of positive emotions or situations, and  patients challenged to use positive emotions to combat negative ones.  Special emphasis will be placed on coping with negative emotions in conflict situations,  and patients will process healthy conflict resolution skills.    Therapeutic Goals:   1. Patient will identify two positive emotions or experiences to reflect on in order to balance out negative emotions:    2. Patient will label two or more emotions that they find the most difficult to experience:    3. Patient will be able to demonstrate positive conflict resolution skills through discussion or role plays:      Summary of Progress/Problems: The topic for today was Emotional Regulation Peers were asked to reflect on 2 incidents when they lives were controlled by emotions.  With facilitation from  group Leader those situations were reflected by patient and their peers.  Tony Cunningham was able to discuss with her peers her angry outburst. She shared many examples and provided insight to others.Tony Cunningham had agreed to come to group and even though he appeared sleepy he was able to follow his peers in discussion. He expressed he gets mad at his group home director and when he is angry when she yells at him he regulates his emotions by leaving the room, going to  porch and going for a walk.  Patient's were then to review ways to improve thier emotions and to share options that assisted them in reducing thier own emotional disregulation ( taking medications, keep doctors appointments, create a daily routine,excercize, volunteer, call friend/family and several stress reduction techniques were reviewed.)    Tony Tasker LCSW

## 2016-10-14 NOTE — Progress Notes (Signed)
Affect flat and eye contact is intense.  Notable tremors.  Poor hygiene and notable body odor.  Stays in room sleeping.  Falls asleep during meals and anytime sits still.  Delayed responses.  Support and encouragement offered.  Safety maintained.

## 2016-10-14 NOTE — Tx Team (Signed)
Interdisciplinary Treatment and Diagnostic Plan Update  10/14/2016 Time of Session: 1125 Tony Cunningham MRN: [id number]  Principal Diagnosis: Schizoaffective disorder, depressive type (HCC)  Secondary Diagnoses: Principal Problem:   Schizoaffective disorder, depressive type (HCC) Active Problems:   Diabetes (HCC)   HTN (hypertension)   Tobacco use disorder   Dyslipidemia   Asthma   Tardive dyskinesia   Polysubstance abuse   Current Medications:  Current Facility-Administered Medications  Medication Dose Route Frequency Provider Last Rate Last Dose  . acetaminophen (TYLENOL) tablet 650 mg  650 mg Oral Q6H PRN [name], [name], MD   650 mg at 10/13/16 0156  . alum & mag hydroxide-simeth (MAALOX/MYLANTA) 200-200-20 MG/5ML suspension 30 mL  30 mL Oral Q4H PRN [name], [name], MD      . cloZAPine (CLOZARIL) tablet 150 mg  150 mg Oral QHS Hernandez-Gonzalez, Andrea, MD   150 mg at 10/13/16 1744  . fluvoxaMINE (LUVOX) tablet 50 mg  50 mg Oral QHS Hernandez-Gonzalez, Andrea, MD   50 mg at 10/13/16 1744  . gabapentin (NEURONTIN) capsule 300 mg  300 mg Oral TID [name], [name], MD   300 mg at 10/14/16 0838  . insulin aspart (novoLOG) injection 0-15 Units  0-15 Units Subcutaneous TID WC Hernandez-Gonzalez, Andrea, MD   3 Units at 10/14/16 0838  . insulin aspart (novoLOG) injection 0-5 Units  0-5 Units Subcutaneous QHS Hernandez-Gonzalez, Andrea, MD   3 Units at 10/13/16 2203  . insulin aspart (novoLOG) injection 7 Units  7 Units Subcutaneous TID WC [name], [name], MD   7 Units at 10/14/16 0842  . insulin glargine (LANTUS) injection 42 Units  42 Units Subcutaneous QHS Hernandez-Gonzalez, Andrea, MD   42 Units at 10/13/16 2203  . ipratropium (ATROVENT) 0.06 % nasal spray 1 spray  1 spray Each Nare QPC supper Hernandez-Gonzalez, Andrea, MD   1 spray at 10/13/16 1745  . lidocaine (LIDODERM) 5 % 1 patch  1 patch Transdermal Q24H Hernandez-Gonzalez, Andrea, MD   1 patch at 10/12/16 1222  . lisinopril  (PRINIVIL,ZESTRIL) tablet 20 mg  20 mg Oral Daily [name], [name], MD   20 mg at 10/14/16 0838  . magnesium hydroxide (MILK OF MAGNESIA) suspension 30 mL  30 mL Oral Daily PRN [name], [name], MD      . meloxicam (MOBIC) tablet 7.5 mg  7.5 mg Oral BID Hernandez-Gonzalez, Andrea, MD   7.5 mg at 10/14/16 0838  . metFORMIN (GLUCOPHAGE) tablet 1,000 mg  1,000 mg Oral BID WC [name], [name], MD   1,000 mg at 10/14/16 0838  . montelukast (SINGULAIR) tablet 10 mg  10 mg Oral Daily Hernandez-Gonzalez, Andrea, MD   10 mg at 10/14/16 0838  . nicotine (NICODERM CQ - dosed in mg/24 hours) patch 21 mg  21 mg Transdermal Daily Hernandez-Gonzalez, Andrea, MD   21 mg at 10/14/16 0839  . polyethylene glycol (MIRALAX / GLYCOLAX) packet 17 g  17 g Oral Daily Hernandez-Gonzalez, Andrea, MD   17 g at 10/14/16 0837  . propranolol (INDERAL) tablet 20 mg  20 mg Oral BID Hernandez-Gonzalez, Andrea, MD   20 mg at 10/14/16 0838  . simvastatin (ZOCOR) tablet 40 mg  40 mg Oral QHS Hernandez-Gonzalez, Andrea, MD   40 mg at 10/13/16 1744  . venlafaxine XR (EFFEXOR-XR) 24 hr capsule 37.5 mg  37.5 mg Oral Q breakfast Hernandez-Gonzalez, Andrea, MD   37.5 mg at 10/14/16 0838   PTA Medications: Prescriptions Prior to Admission  Medication Sig Dispense Refill Last Dose  .   amantadine (SYMMETREL) 100 MG capsule Take 1 capsule (100 mg total) by mouth 2 (two) times daily. 60 capsule 0 10/07/2016 at Unknown time  . diazepam (VALIUM) 10 MG tablet Take 1 tablet (10 mg total) by mouth daily. Give daily with supper 30 tablet 0 10/07/2016 at Unknown time  . gabapentin (NEURONTIN) 300 MG capsule Take 1 capsule (300 mg total) by mouth 3 (three) times daily. 90 capsule 0 10/07/2016 at Unknown time  . hydrALAZINE (APRESOLINE) 25 MG tablet Take 1 tablet (25 mg total) by mouth every 8 (eight) hours. 90 tablet 0 10/07/2016 at Unknown time  . insulin aspart (NOVOLOG) 100 UNIT/ML injection Inject 7 Units into the skin 3 (three) times daily with meals. 7 mL  0 10/07/2016 at Unknown time  . insulin glargine (LANTUS) 100 UNIT/ML injection Inject 0.39 mLs (39 Units total) into the skin at bedtime. 12 mL 0 10/07/2016 at Unknown time  . lisinopril (PRINIVIL,ZESTRIL) 20 MG tablet Take 1 tablet (20 mg total) by mouth daily. 30 tablet 0 10/07/2016 at Unknown time  . lurasidone (LATUDA) 80 MG TABS tablet Take 1 tablet (80 mg total) by mouth daily with supper. 30 tablet 0 10/07/2016 at Unknown time  . meloxicam (MOBIC) 7.5 MG tablet Take 7.5 mg by mouth daily.   10/07/2016 at Unknown time  . metFORMIN (GLUCOPHAGE) 1000 MG tablet Take 1 tablet (1,000 mg total) by mouth 2 (two) times daily with a meal. 60 tablet 0 10/07/2016 at Unknown time  . montelukast (SINGULAIR) 10 MG tablet Take 1 tablet (10 mg total) by mouth at bedtime. For Asthma 30 tablet 0 10/07/2016 at Unknown time  . simvastatin (ZOCOR) 40 MG tablet Take 1 tablet (40 mg total) by mouth at bedtime. For high cholesterol 15 tablet 0 10/07/2016 at Unknown time  . venlafaxine XR (EFFEXOR-XR) 150 MG 24 hr capsule Take 1 capsule (150 mg total) by mouth daily with breakfast. 30 capsule 0 10/07/2016 at Unknown time  . cyclobenzaprine (FLEXERIL) 10 MG tablet Take 1 tablet (10 mg total) by mouth 3 (three) times daily as needed for muscle spasms. 60 tablet 0 10/07/2016 at Unknown time    Patient Stressors: Health problems Loss of support system  Patient Strengths: Ability for insight Average or above average intelligence Communication skills Motivation for treatment/growth  Treatment Modalities: Medication Management, Group therapy, Case management,  1 to 1 session with clinician, Psychoeducation, Recreational therapy.   Physician Treatment Plan for Primary Diagnosis: Schizoaffective disorder, depressive type ([facility]) Long Term Goal(s): Improvement in symptoms so as ready for discharge Improvement in symptoms so as ready for discharge   Short Term Goals: Ability to verbalize feelings will improve Ability to  demonstrate self-control will improve Ability to identify and develop effective coping behaviors will improve Ability to identify and develop effective coping behaviors will improve Ability to identify triggers associated with substance abuse/mental health issues will improve  Medication Management: Evaluate patient's response, side effects, and tolerance of medication regimen.  Therapeutic Interventions: 1 to 1 sessions, Unit Group sessions and Medication administration.  Evaluation of Outcomes: Not Met  Physician Treatment Plan for Secondary Diagnosis: Principal Problem:   Schizoaffective disorder, depressive type ([place]) Active Problems:   Diabetes ([place])   HTN (hypertension)   Tobacco use disorder   Dyslipidemia   Asthma   Tardive dyskinesia   Polysubstance abuse  Long Term Goal(s): Improvement in symptoms so as ready for discharge Improvement in symptoms so as ready for discharge   Short Term Goals: Ability to verbalize feelings  will improve Ability to demonstrate self-control will improve Ability to identify and develop effective coping behaviors will improve Ability to identify and develop effective coping behaviors will improve Ability to identify triggers associated with substance abuse/mental health issues will improve     Medication Management: Evaluate patient's response, side effects, and tolerance of medication regimen.  Therapeutic Interventions: 1 to 1 sessions, Unit Group sessions and Medication administration.  Evaluation of Outcomes: Not Met   RN Treatment Plan for Primary Diagnosis: Schizoaffective disorder, depressive type (HCC) Long Term Goal(s): Knowledge of disease and therapeutic regimen to maintain health will improve  Short Term Goals: Ability to identify and develop effective coping behaviors will improve  Medication Management: RN will administer medications as ordered by provider, will assess and evaluate patient's response and provide education  to patient for prescribed medication. RN will report any adverse and/or side effects to prescribing provider.  Therapeutic Interventions: 1 on 1 counseling sessions, Psychoeducation, Medication administration, Evaluate responses to treatment, Monitor vital signs and CBGs as ordered, Perform/monitor CIWA, COWS, AIMS and Fall Risk screenings as ordered, Perform wound care treatments as ordered.  Evaluation of Outcomes: Not Met   LCSW Treatment Plan for Primary Diagnosis: Schizoaffective disorder, depressive type (HCC) Long Term Goal(s): Safe transition to appropriate next level of care at discharge, Engage patient in therapeutic group addressing interpersonal concerns.  Short Term Goals: Engage patient in aftercare planning with referrals and resources and Increase skills for wellness and recovery  Therapeutic Interventions: Assess for all discharge needs, 1 to 1 time with Social worker, Explore available resources and support systems, Assess for adequacy in community support network, Educate family and significant other(s) on suicide prevention, Complete Psychosocial Assessment, Interpersonal group therapy.  Evaluation of Outcomes: Not Met   Progress in Treatment: Attending groups: No. Participating in groups: No. Taking medication as prescribed: Yes. Toleration medication: Yes. Family/Significant other contact made: No, will contact:  when given permission Patient understands diagnosis: Yes. Discussing patient identified problems/goals with staff: Yes. Medical problems stabilized or resolved: Yes. Denies suicidal/homicidal ideation: Yes. Issues/concerns per patient self-inventory: No. Other: none  New problem(s) identified: No, Describe:  none  New Short Term/Long Term Goal(s): PT unable to articulate a goal today.  Discharge Plan or Barriers: Return to day program at [facility].  Reason for Continuation of Hospitalization: Medication stabilization  Estimated Length of Stay: 5-7  days.  Attendees: Patient: Tony Cunningham 10/09/2016   Physician: Dr. Hernandez, MD 10/09/2016   Nursing: Phyllis Cobb, BSN, RN 10/09/2016   RN Care Manager: 10/09/2016  Social Worker:  , MSW, LCSW-A 10/09/2016   Recreational Therapist: Beth Greene, LRT/CTRS  10/09/2016   Other:  10/09/2016   Other:  10/09/2016   Other: 10/09/2016        Scribe for Treatment Team:  R , LCSWA 10/14/2016 11:58 AM 

## 2016-10-14 NOTE — Progress Notes (Signed)
Initial Nutrition Assessment  DOCUMENTATION CODES:   Not applicable  INTERVENTION:   D/c snacks as pt is eating snacks from the day room and ordered dietary snacks.  Recommend pt getting only one snack during snack times  RD will provide diabetes education when appropriate   NUTRITION DIAGNOSIS:   Unintentional weight loss related to other (see comment) (diabetes) as evidenced by 13 percent weight loss in 8 months.  GOAL:   Patient will meet greater than or equal to 90% of their needs  MONITOR:   PO intake, Labs, Weight trends  ASSESSMENT:   41 year old single male with history of DM, schizoaffective disorder depressed type patient came voluntarily to our emergency department in the company of mobile crisis unit. He was complaining of hearing voices which were commanding him to harm himself. Patient reported that he was thinking about killing himself by running into traffic or drowning himself.   RD spoke with RN today. RN feels pt is still not appropriate for diabetes education. Pt sleeping more than usual today and still does not seem interested in diabetes management. RD left handout for pt to read on nutrition and type II diabetes. Per RN, pt has good appetite and eating meals and snacks ordered plus snacks from the day room. Since pt is eating double snacks and it is difficult to limit day room snacks, RD will discontinue ordered dietary snacks. RD will provide diabetes education when appropriate and pt seems more interested in self-care.   Medications reviewed and include: insulin, meloxicam, metformin, nicotine, miralax  Blood glucoses- 211, 254, 132, 160, 98 x 24hrs- improved AIC 11.2(H)- 5/18  Diet Order:  Diet Carb Modified Fluid consistency: Thin; Room service appropriate? Yes  Skin:  Reviewed, no issues  Last BM:  5/20  Height:   Ht Readings from Last 1 Encounters:  10/08/16 5\' 9"  (1.753 m)    Weight:   Wt Readings from Last 1 Encounters:  10/08/16 163  lb (73.9 kg)    Ideal Body Weight:  72.7 kg  BMI:  Body mass index is 24.07 kg/m.  Estimated Nutritional Needs:   Kcal:  1850-2150kcal/day   Protein:  74-89g/day   Fluid:  >1.8L/day   EDUCATION NEEDS:   Education needs no appropriate at this time  10/10/16 MS, RD, LDN Pager #- [phone]

## 2016-10-14 NOTE — Progress Notes (Signed)
Recreation Therapy Notes  Date: 05.23.18 Time: 9:30 am Location: Craft Room  Group Topic: Self-esteem  Goal Area(s) Addresses:  Patient will write at least one positive trait about self. Patient will verbalize benefit of having a healthy self-esteem.  Behavioral Response: Did not attend   Intervention: I Am  Activity: Patients were given a worksheet with the letter I on it and were instructed to write as many positive traits about themselves inside the letter.  Education: LRT educated patients on ways to increase their self-esteem.   Education Outcome: Patient did not attend group.  Clinical Observations/Feedback: Patient did not attend group.  Jacquelynn Cree, LRT/CTRS 10/14/2016 10:12 AM

## 2016-10-14 NOTE — Progress Notes (Signed)
D: Pt denies SI/HI/AVH, but was noted to be responding to internal stimuli. Pt is pleasant and cooperative, affect is flat but brightens upon approach. Patient appears to have some confusion to place, time and situation and he is not interacting with peers and staff appropriately.  A: Pt was offered support and encouragement. Pt was given scheduled medications. Pt was encouraged to attend groups. Q 15 minute checks were done for safety.  R: Pt attended group and does not interacts well with peers and staff. Pt is taking medication. Pt has no complaints.Pt receptive to treatment and safety maintained on unit.

## 2016-10-15 LAB — CLOZAPINE (CLOZARIL)
CLOZAPINE LVL: 212 ng/mL — AB (ref 350–650)
NorClozapine: 88 ng/mL
TOTAL(CLOZ+ NORCLOZ): 300 ng/mL

## 2016-10-15 LAB — GLUCOSE, CAPILLARY
GLUCOSE-CAPILLARY: 354 mg/dL — AB (ref 65–99)
Glucose-Capillary: 93 mg/dL (ref 65–99)
Glucose-Capillary: 198 mg/dL — ABNORMAL HIGH (ref 65–99)
Glucose-Capillary: 279 mg/dL — ABNORMAL HIGH (ref 65–99)

## 2016-10-15 LAB — TROPONIN I

## 2016-10-15 MED ORDER — CLOZAPINE 100 MG PO TABS: 100.0000 mg | ORAL_TABLET | Freq: Every day | ORAL | Status: DC

## 2016-10-15 MED ORDER — CLOZAPINE 25 MG PO TABS
75.0000 mg | ORAL_TABLET | Freq: Every day | ORAL | Status: DC
  Administered 2016-10-15: 75 mg via ORAL
  Filled 2016-10-15: qty 3

## 2016-10-15 NOTE — Progress Notes (Signed)
D: Patient was noted to be sleeping heavily, appear lethargic,called his name numerous times, he didn't t respond, he was noted to be sweating profusely with strong body odor, patient was touched and gently shaken was able to respond to tactile stimuli, and eventually he opened his eyes, became aroused and with assistance patient was was placed in a semi fowler position and his blood glucose was obtained. Patient's blood glucose was 55, immediately the hypoglycemia protocol was initiated,  patient was given 1 glucose tablet and 15 minutes later blood glucose was rechecked with a reading of 105. Patient's bedding and clothes was changed and he was given evening diabetic snack. Will continue to closely monitor

## 2016-10-15 NOTE — Progress Notes (Signed)
Inpatient Diabetes Program Recommendations  AACE/ADA: New Consensus Statement on Inpatient Glycemic Control (2015)  Target Ranges:  Prepandial:   less than 140 mg/dL      Peak postprandial:   less than 180 mg/dL (1-2 hours)      Critically ill patients:  140 - 180 mg/dL   Lab Results  Component Value Date   GLUCAP 93 10/15/2016   HGBA1C 11.2 (H) 10/09/2016    Review of Glycemic Control  Results for Tony Cunningham, Tony Cunningham (MRN [id number]) as of 10/15/2016 10:48  Ref. Range 10/14/2016 11:34 10/14/2016 16:09 10/14/2016 20:36 10/14/2016 22:28 10/15/2016 06:59  Glucose-Capillary Latest Ref Range: 65 - 99 mg/dL 98 144 (H) 55 (L) 315 (H) 93    Diabetes history:Type 2  Outpatient Diabetes medications: Lantus 39units qhs, Metformin 1000mg  bid, Novolog 7 units tid  Current orders for Inpatient glycemic control: Lantus 42units qhs, Metformin 1000mg  bid, Novolog 0-15 units tid, Novolog 0-5 units qhs, Novolog 5 units tid  Inpatient Diabetes Program Recommendations:   Low blood sugar yesterday afternoon because patient got Novolog 5 units (meal coverage insulin) but he didn't eat anything therefore low blood sugar resulted.   Patient did not receive Novolog mealtime insulin this am even though the patient ate 100%- anticipate patient will have an elevated CBG at lunch. Correct to hold sliding scale "correction insulin" since the CBG was 93mg /dl.   Please give the patient mealtime (set dose of Novolog) if the patient eats greater than 50%, hold it if he eats less than 50%.   Agree with current orders.   , RN, BA, MHA, CDE Diabetes Coordinator Inpatient Diabetes Program  [phone] (Team Pager) [phone] [facility] Office) 10/15/2016 10:52 AM

## 2016-10-15 NOTE — Progress Notes (Signed)
Affect brighter today.  Less lethargic.  Continues to isolate to room.  Denies SI/HI/AVH.  Mediation compliant. Support offered.  Safety maintained.

## 2016-10-15 NOTE — Progress Notes (Signed)
Recreation Therapy Notes  Date: 05.24.18 Time: 9:30 am Location: Craft Room  Group Topic: Leisure Education  Goal Area(s) Addresses:  Patient will identify things they are grateful for. Patient will identify how being grateful can influence decision making.  Behavioral Response: Did not attend   Intervention: Grateful Wheel  Activity: Patients were given an I Am Grateful For worksheet and were instructed to write things they are grateful for under each category.  Education: LRT educated patients on leisure.  Education Outcome: Patient did not attend group.  Clinical Observations/Feedback: Patient did not attend group.  Jacquelynn Cree, LRT/CTRS 10/15/2016 10:04 AM

## 2016-10-15 NOTE — [facility] Group Notes (Signed)
[facility] LCSW Group Therapy Note  Type of Therapy and Topic:  Group Therapy:  Goals Group: SMART Goals  Participation Level:  Patient did not attend group. CSW invited patient to group.   Description of Group:   The purpose of a daily goals group is to assist and guide patients in setting recovery/wellness-related goals.  The objective is to set goals as they relate to the crisis in which they were admitted. Patients will be using SMART goal modalities to set measurable goals.  Characteristics of realistic goals will be discussed and patients will be assisted in setting and processing how one will reach their goal. Facilitator will also assist patients in applying interventions and coping skills learned in psycho-education groups to the SMART goal and process how one will achieve defined goal.  Therapeutic Goals: -Patients will develop and document one goal related to or their crisis in which brought them into treatment. -Patients will be guided by LCSW using SMART goal setting modality in how to set a measurable, attainable, realistic and time sensitive goal.  -Patients will process barriers in reaching goal. -Patients will process interventions in how to overcome and successful in reaching goal.   Summary of Patient Progress:  Patient Goal: Patient did not attend group. CSW invited patient to group.    Therapeutic Modalities:   Motivational Interviewing Engineer, manufacturing systems Therapy Crisis Intervention Model SMART goals setting  Belynda Pagaduan G. Garnette Czech MSW, LCSWA 10/15/2016 10:06 AM

## 2016-10-15 NOTE — Progress Notes (Signed)
[facility] MD Progress Note  10/15/2016 8:58 AM Tony Cunningham  MRN:  [id number]  Subjective:   41 year old single male with history of schizoaffective disorder depressed type patient came voluntarily to our emergency department in the company of mobile crisis unit. He was complaining of hearing voices which were commanding him to harm himself. Patient reported that he was thinking about killing himself by running into traffic or drowning himself.  10/10/16:The patient has been isolative to his room this morning and had minimal interaction with staff and peers. He did eventually come out of his room for a little while but did not interact with peers. He was feeling very lethargic and tired this morning. He denies any current active or passive suicidal thoughts or psychotic symptoms. He says he does not want to go back to the group home because the other residents there are mean to him. He did not answer all questions asked that she was falling asleep and lethargic during the visit. He has been compliant with psychotropic medications. He slept over 8 hours last night. Blood pressure was slightly elevated this morning. Blood sugars have become more controlled compared to the time of admission.  10/11/16:  The patient reports that his anxiety has decreased with the Clozaril and that is the only positive changes noticed with the medication. He is not as sedated or tired this morning compared to yesterday. He did get up for breakfast but otherwise has been isolative to his room. He did not go outside with the other patients and has not been attending groups. The patient gave very refer responses to questions asked. He denies any current active or passive suicidal thoughts or hallucinations. He denied paranoid thoughts but did express paranoid thoughts about the group home and people being upset with him for calling the ambulance. He denies any somatic complaints. Blood sugars have been elevated. Per nursing, he slept  7.45 hours last night but has been sleeping intermittently throughout the daytime. Vital signs are stable.  5/21 patient has been withdrawn to his room. Minimal participation in programming. Tells me he is not suicidal, homicidal and does not have hallucinations. He reports feeling well denies problems with appetite, energy or concentration. Says that  he is not sleeping well throughout the night. Nurses told me he has been is sleeping all throughout the day. Patient denies lightheadedness, constipation, drooling or dizziness.  5/22 withdrawn to his room. Sleeping during the daytime. Not going to groups. Poor hygiene and grooming. He denies having any lightheadedness, dizziness, constipation or chest pain. He does complain of drooling. He denies suicidality, homicidality or hallucinations. He says that he is having thoughts about pushing his mother but he will never do that because she is his angel. Patient makes some statements that appeared to be unrelated to the questions. This is not sleeping well Tony Cunningham is sleeping a lot throughout the day). He does complain of some mild sedation. During assessment he did not appear sedated  5/23 without change. He is not participating in any groups. He is hygiene and grooming are poor. She stays in bed all day on him in his room for meals. He denies to me having any dizziness, lightheadedness, chest pain, constipation. He does complain of feeling a little bit tired. Denies suicidality, homicidality or auditory or visual hallucinations.   5/24 pairs staff the patient was overly sedated yesterday. To the point that he was falling asleep while eating. He has been withdrawn and stays in his room most of the time.  I will decrease the Clozaril dose to 75 mg tonight. Clozaril level is currently pending. Patient says that he feels a little bit better than before. He does complain of feeling tired. Other than that he denies any lightheadedness, dizziness, chest pain,  constipation or sialorrhea. He denies SI, HI or auditory or visual hallucinations. He does tell me he continues to have thoughts that he would rather go to sleep and not wake up  Per nursing: D: Patient was noted to be sleeping heavily, appear lethargic,called his name numerous times, he didn't t respond, he was noted to be sweating profusely with strong body odor, patient was touched and gently shaken was able to respond to tactile stimuli, and eventually he opened his eyes, became aroused and with assistance patient was was placed in a semi fowler position and his blood glucose was obtained. Patient's blood glucose was 55, immediately the hypoglycemia protocol was initiated,  patient was given 1 glucose tablet and 15 minutes later blood glucose was rechecked with a reading of 105. Patient's bedding and clothes was changed and he was given evening diabetic snack. Will continue to closely monitor   Affect flat and eye contact is intense.  Notable tremors.  Poor hygiene and notable body odor.  Stays in room sleeping.  Falls asleep during meals and anytime sits still.  Delayed responses.  Support and encouragement offered.  Safety maintained.    Principal Problem: Schizoaffective disorder, depressive type (HCC) Diagnosis:   Patient Active Problem List   Diagnosis Date Noted  . Schizoaffective disorder, depressive type (HCC) [F25.1] 09/23/2016  . Polysubstance abuse [F19.10] 07/17/2016  . Tardive dyskinesia [G24.01] 07/16/2016  . Tobacco use disorder [F17.200] 07/07/2016  . Dyslipidemia [E78.5] 07/07/2016  . Asthma [J45.909] 07/07/2016  . HTN (hypertension) [I10] 07/06/2016  . Diabetes (HCC) [E11.9] 12/25/2010   Total Time spent with patient: 30 minutes  Past Psychiatric History: Patient has a long history of mental illness and has had multiple hospitalizations including multiple hospitalizations in [place]. Most of those records are unavailable to me. 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 Not sure whether he ever returns to an adequate baseline. He tells me that he has tried to cut himself and kill himself in the past. Denies any history of violence. He can't remember the names of any of his medicines. Outpatient medicines listed on admission included Abilify and Effexor. Also hospitalized at [facility], [place].  Past Medical History:  Past Medical History:  Diagnosis Date  . Anxiety   . Asthma   . Diabetes mellitus   . High blood pressure   . Sinus complaint    History reviewed. No pertinent surgical history.   Family History: History reviewed. No pertinent family history.   Family Psychiatric  History: None known   Social History: Living in a group home. Has been there about 4-6 months he estimates. Says that he does not like it. 11 grade education. Does not have a legal guardian  History  Alcohol Use No     History  Drug Use No    Social History   Social History  . Marital status: Single    Spouse name: [name]  . Number of children: [name]  . Years of education: [name]   Social History Main Topics  . Smoking status: Current Every Day Smoker    Packs/day: 0.50    Types: Cigarettes  . Smokeless tobacco: Never Used  . Alcohol use No  .  Drug use: No  . Sexual activity: Not Currently   Other Topics Concern  . None   Social History Narrative  . None     Current Medications: Current Facility-Administered Medications  Medication Dose Route Frequency Provider Last Rate Last Dose  . acetaminophen (TYLENOL) tablet 650 mg  650 mg Oral Q6H PRN [name], [name], MD   650 mg at 10/13/16 0156  . alum & mag hydroxide-simeth (MAALOX/MYLANTA) 200-200-20 MG/5ML suspension 30 mL  30 mL Oral Q4H PRN [name], [name], MD      . cloZAPine (CLOZARIL) tablet 75 mg  75 mg Oral QHS Hernandez-Gonzalez, Sue Lush, MD      . dextrose (GLUTOSE) 40 % oral gel 37.5 g  1 Tube Oral Once PRN [name], MD      .  fluvoxaMINE (LUVOX) tablet 50 mg  50 mg Oral QHS [name], MD   50 mg at 10/14/16 1642  . insulin aspart (novoLOG) injection 0-15 Units  0-15 Units Subcutaneous TID WC [name], MD   3 Units at 10/14/16 [phone]  . insulin aspart (novoLOG) injection 0-5 Units  0-5 Units Subcutaneous QHS [name], MD   3 Units at 10/13/16 2203  . insulin aspart (novoLOG) injection 5 Units  5 Units Subcutaneous TID WC [name], MD   5 Units at 10/14/16 1643  . insulin glargine (LANTUS) injection 42 Units  42 Units Subcutaneous QHS [name], MD   42 Units at 10/14/16 2236  . ipratropium (ATROVENT) 0.06 % nasal spray 1 spray  1 spray Each Nare QPC supper [name], MD   1 spray at 10/14/16 1646  . lidocaine (LIDODERM) 5 % 1 patch  1 patch Transdermal Q24H [name], MD   1 patch at 10/14/16 1234  . lisinopril (PRINIVIL,ZESTRIL) tablet 20 mg  20 mg Oral Daily [name], [name], MD   20 mg at 10/14/16 9604  . magnesium hydroxide (MILK OF MAGNESIA) suspension 30 mL  30 mL Oral Daily PRN [name], [name], MD      . meloxicam (MOBIC) tablet 7.5 mg  7.5 mg Oral BID [name], MD   7.5 mg at 10/15/16 0815  . metFORMIN (GLUCOPHAGE) tablet 1,000 mg  1,000 mg Oral BID WC [name], [name], MD   1,000 mg at 10/15/16 0815  . montelukast (SINGULAIR) tablet 10 mg  10 mg Oral Daily [name], MD   10 mg at 10/15/16 0815  . nicotine (NICODERM CQ - dosed in mg/24 hours) patch 21 mg  21 mg Transdermal Daily [name], MD   21 mg at 10/15/16 0815  . polyethylene glycol (MIRALAX / GLYCOLAX) packet 17 g  17 g Oral Daily [name], MD   17 g at 10/15/16 0815  . propranolol (INDERAL) tablet 20 mg  20 mg Oral BID [name], MD   20 mg at 10/14/16 1642  . simvastatin (ZOCOR) tablet 40 mg  40 mg Oral QHS [name], MD   40 mg at  10/14/16 1642    Lab Results:  Results for orders placed or performed during the hospital encounter of 10/08/16 (from the past 48 hour(s))  Glucose, capillary     Status: Abnormal   Collection Time: 10/13/16 11:27 AM  Result Value Ref Range   Glucose-Capillary 368 (H) 65 - 99 mg/dL   Comment 1 Notify RN   Glucose, capillary     Status: Abnormal   Collection Time: 10/13/16  4:56 PM  Result Value Ref Range  Glucose-Capillary 211 (H) 65 - 99 mg/dL   Comment 1 Notify RN   Glucose, capillary     Status: Abnormal   Collection Time: 10/13/16  8:26 PM  Result Value Ref Range   Glucose-Capillary 254 (H) 65 - 99 mg/dL  Glucose, capillary     Status: Abnormal   Collection Time: 10/14/16  6:46 AM  Result Value Ref Range   Glucose-Capillary 132 (H) 65 - 99 mg/dL  Glucose, capillary     Status: Abnormal   Collection Time: 10/14/16  8:34 AM  Result Value Ref Range   Glucose-Capillary 160 (H) 65 - 99 mg/dL   Comment 1 Document in Chart   Glucose, capillary     Status: None   Collection Time: 10/14/16 11:34 AM  Result Value Ref Range   Glucose-Capillary 98 65 - 99 mg/dL   Comment 1 Notify RN   Glucose, capillary     Status: Abnormal   Collection Time: 10/14/16  4:09 PM  Result Value Ref Range   Glucose-Capillary 101 (H) 65 - 99 mg/dL  Glucose, capillary     Status: Abnormal   Collection Time: 10/14/16  8:36 PM  Result Value Ref Range   Glucose-Capillary 55 (L) 65 - 99 mg/dL  Glucose, capillary     Status: Abnormal   Collection Time: 10/14/16 10:28 PM  Result Value Ref Range   Glucose-Capillary 159 (H) 65 - 99 mg/dL  Glucose, capillary     Status: None   Collection Time: 10/15/16  6:59 AM  Result Value Ref Range   Glucose-Capillary 93 65 - 99 mg/dL    Blood Alcohol level:  Lab Results  Component Value Date   ETH <5 10/07/2016   ETH <5 09/23/2016    Metabolic Disorder Labs: Lab Results  Component Value Date   HGBA1C 11.2 (H) 10/09/2016   MPG 275 10/09/2016   MPG 275  09/24/2016   Lab Results  Component Value Date   PROLACTIN 24.5 (H) 09/24/2016   PROLACTIN 3.4 (L) 07/07/2016   Lab Results  Component Value Date   CHOL 127 10/09/2016   TRIG 80 10/09/2016   HDL 39 (L) 10/09/2016   CHOLHDL 3.3 10/09/2016   VLDL 16 10/09/2016   LDLCALC 72 10/09/2016   LDLCALC 56 07/07/2016     Musculoskeletal: Strength & Muscle Tone: within normal limits Gait & Station: normal Patient leans: [name]  Psychiatric Specialty Exam: Physical Exam  Nursing note and vitals reviewed. Constitutional: He is oriented to person, place, and time. He appears well-developed and well-nourished.  HENT:  Head: Normocephalic and atraumatic.  Eyes: Conjunctivae and EOM are normal.  Neck: Normal range of motion.  Respiratory: Effort normal.  Neurological: He is alert and oriented to person, place, and time. He displays no tremor.    Review of Systems  Constitutional: Negative.  Negative for chills, fever and weight loss.  HENT: Negative.  Negative for congestion, hearing loss and sore throat.   Eyes: Negative.  Negative for blurred vision and double vision.  Respiratory: Negative.  Negative for cough, hemoptysis, sputum production and shortness of breath.   Cardiovascular: Negative.  Negative for chest pain and palpitations.  Gastrointestinal: Negative for abdominal pain, constipation, diarrhea, heartburn, nausea and vomiting.  Genitourinary: Negative.  Negative for dysuria, frequency and urgency.  Musculoskeletal: Positive for neck pain. Negative for back pain, joint pain and myalgias.  Skin: Negative.  Negative for rash.  Neurological: Positive for tremors. Negative for dizziness, tingling, sensory change, speech change and headaches.  Endo/Heme/Allergies:  Negative.  Negative for environmental allergies. Does not bruise/bleed easily.  Psychiatric/Behavioral: Positive for depression. Negative for hallucinations, memory loss, substance abuse and suicidal ideas. The patient is  nervous/anxious. The patient does not have insomnia.     Blood pressure 99/67, pulse 96, temperature 98.2 F (36.8 C), temperature source Oral, resp. rate 18, height 5\' 9"  (1.753 m), weight 73.9 kg (163 lb), SpO2 100 %.Body mass index is 24.07 kg/m.  General Appearance: Disheveled  Eye Contact:  Good  Speech:  Slow  Volume:  Decreased  Mood:  Dysphoric  Affect:  Blunt  Thought Process:  Linear and Descriptions of Associations: Intact Concrete  Orientation:  NA  Thought Content:  Logical  Suicidal Thoughts:  No  Homicidal Thoughts:  No  Memory:  Immediate;   Fair Recent;   Poor Remote;   Poor  Judgement:  Poor  Insight:  Lacking  Psychomotor Activity:  Normal  Concentration:  Concentration: Fair and Attention Span: Fair  Recall:  Poor  Fund of Knowledge:  Poor  Language:  Fair  Akathisia:  No  Handed:    AIMS (if indicated):     Assets:    ADL's:  Intact  Cognition:  Impaired,  Mild  Sleep:  Number of Hours: 6.75     Treatment Plan Summary: 41 y/o male with depression, SI and hallucinations. Multiple prior hospitalization and prior suicidal attempts. Just d/c last week.  Per review of records patient has history of abusing opiates, Lyrica, Remeron and Flexeril. Here he has been frequently asking for Ativan or clonazepam.  Schizoaffective d/o depressed type: multiple failures to antipsychotics. Pt frequently hospitalized.  -Currently on a trial of Clozaril. I will decrease dose of Clozaril to 75 mg this evening as patient has been withdrawn and is sleeping more than usual. Clozaril level was obtained at last night results are pending  -For clozaril augmentation he is on Luvox 50 mg at 1700   Sialorrhea: Continue atrovent sublingual at bedtime  Akathisia/TD: Amantadine was discontinued for now. Clozaril should improve tardive dyskinesia  Tremors: continue propranolol 20 mg twice a day  Insomnia: Will target insomnia with Clozaril--- sleeping well  History  of abusing opiates, Flexeril, Lyrica and mirtazapine. We'll benefit from continuing outpatient substance abuse services.   For diabetes: Continue Lantus 42 units daily at bedtime. Continue NovoLog but will decrease to 5 units 3 times a day with meals. Continue metformin 1000 mg twice a day. Will place orders for supplemental insulin. Had an episode of hypoglycemia last night. Appears that he skipped dinner  Hypertension: Continue lisinopril 20 mg a day and propanolol 20 mg twice a day.  BP and HR wnl  Dyslipidemia continue Zocor 40 mg at 1700  Asthma continue Singulair 10 mg q daily  Neck pain: Continue Neurontin 300 mg 3 times a day and Mobic 7.5 mg bid. Continue lidocaine patch to neck. Multiple visits to the ER complaining of neck pain  Constipation:continue miralax daily   Tobacco use disorder: continue nicotine patch 21 mg a day  Labs: hemoglobin A1c, lipid panel, TSH all completed---we had all of these from Feb 2018  Diet carb modified and low sodium  Disposition:  back to his group home once stable   Follow up: continue to f/u with Mar 2018 in [place]. Continue day program.  Will refer for case coordination   Labs: Plan to order clozaril level checked on 5/23--results pending.  We will also check again troponins  EKG completed  Consults: consulted DM coordinator,  dietician and PT  Likely will be d/c early next week   [name], MD 10/15/2016, 8:58 AM

## 2016-10-15 NOTE — [facility] Group Notes (Signed)
[facility] LCSW Group Therapy  10/15/2016 1:32 PM  Type of Therapy:  Group Therapy  Participation Level:  Patient did not attend group. CSW invited patient to group.   Summary of Progress/Problems: Balance in life: Patients will discuss the concept of balance and how it looks and feels to be unbalanced. Pt will identify areas in their life that is unbalanced and ways to become more balanced. They discussed what aspects in their lives has influenced their self care. Patients also discussed self care in the areas of self regulation/control, hygiene/appearance, sleep/relaxation, healthy leisure, healthy eating habits, exercise, inner peace/spirituality, self improvement, sobriety, and health management. They were challenged to identify changes that are needed in order to improve self care.  Lamarcus Spira G. Garnette Czech MSW, LCSWA 10/15/2016, 1:34 PM

## 2016-10-16 LAB — CBC WITH DIFFERENTIAL/PLATELET
BASOS PCT: 1 %
Basophils Absolute: 0.1 10*3/uL (ref 0–0.1)
EOS ABS: 0.4 10*3/uL (ref 0–0.7)
EOS PCT: 5 %
HCT: 44.2 % (ref 40.0–52.0)
HEMOGLOBIN: 15.5 g/dL (ref 13.0–18.0)
Lymphocytes Relative: 49 %
Lymphs Abs: 3.3 10*3/uL (ref 1.0–3.6)
MCH: 32.3 pg (ref 26.0–34.0)
MCHC: 35 g/dL (ref 32.0–36.0)
MCV: 92.1 fL (ref 80.0–100.0)
MONO ABS: 0.5 10*3/uL (ref 0.2–1.0)
MONOS PCT: 7 %
NEUTROS PCT: 38 %
Neutro Abs: 2.6 10*3/uL (ref 1.4–6.5)
Platelets: 197 10*3/uL (ref 150–440)
RBC: 4.79 MIL/uL (ref 4.40–5.90)
RDW: 12.7 % (ref 11.5–14.5)
WBC: 6.8 10*3/uL (ref 3.8–10.6)

## 2016-10-16 LAB — GLUCOSE, CAPILLARY
GLUCOSE-CAPILLARY: 194 mg/dL — AB (ref 65–99)
GLUCOSE-CAPILLARY: 135 mg/dL — AB (ref 65–99)
Glucose-Capillary: 275 mg/dL — ABNORMAL HIGH (ref 65–99)
Glucose-Capillary: 360 mg/dL — ABNORMAL HIGH (ref 65–99)

## 2016-10-16 MED ORDER — CLOZAPINE 100 MG PO TABS
100.0000 mg | ORAL_TABLET | Freq: Every day | ORAL | Status: DC
  Administered 2016-10-16 – 2016-10-18 (×3): 100 mg via ORAL
  Filled 2016-10-16 (×3): qty 1

## 2016-10-16 MED ORDER — FLUVOXAMINE MALEATE 50 MG PO TABS
50.0000 mg | ORAL_TABLET | Freq: Two times a day (BID) | ORAL | Status: DC
  Administered 2016-10-16 – 2016-10-18 (×5): 50 mg via ORAL
  Filled 2016-10-16 (×5): qty 1

## 2016-10-16 NOTE — Progress Notes (Signed)
PT Cancellation Note  Patient Details Name: Tony Cunningham MRN: [id number] DOB: May 03, 1976   Cancelled Treatment:    Reason Eval/Treat Not Completed: Other (comment) New order received.  Spoke with primary RN who states patient without change in status since initial evaluation (10/09/16).  Patient ambulating independently (without difficulty) throughout unit and has HEP to address chronic neck stiffness/soreness (reviewed in previous evaluation). Patient safe for discharge to home environment; may consider outpatient PT follow up for pain management if symptoms persist upon discharge.  Will complete re-peat order at this time, as patient without change in status to necessitate formal re-evaluation.  RN informed/aware and in agreement.   Jaicee Michelotti H. Manson Passey, PT, DPT, NCS 10/16/16, 10:23 AM [phone]

## 2016-10-16 NOTE — Progress Notes (Signed)
Recreation Therapy Notes  Date: 05.25.18 Time: 9:30 am Location: Craft Room  Group Topic: Coping Skills  Goal Area(s) Addresses:  Patient will participate in healthy coping skill.  Patient will verbalize benefit of using art as a coping skill.  Behavioral Response: Did not attend  Intervention: Coloring  Activity: Patients were given coloring sheets to color and were instructed to think of the emotions they were feeling and what their minds were focused on.  Education: LRT educated patients on healthy coping skills.  Education Outcome: Patient did not attend group.  Clinical Observations/Feedback: Patient did not attend group.  Jacquelynn Cree, LRT/CTRS 10/16/2016 10:24 AM

## 2016-10-16 NOTE — Progress Notes (Signed)
Inpatient Diabetes Program Recommendations  AACE/ADA: New Consensus Statement on Inpatient Glycemic Control (2015)  Target Ranges:  Prepandial:   less than 140 mg/dL      Peak postprandial:   less than 180 mg/dL (1-2 hours)      Critically ill patients:  140 - 180 mg/dL   Lab Results  Component Value Date   GLUCAP 194 (H) 10/16/2016   HGBA1C 11.2 (H) 10/09/2016    Review of Glycemic Control  Results for Tony Cunningham, Tony Cunningham (MRN [id number]) as of 10/16/2016 07:19  Ref. Range 10/15/2016 06:59 10/15/2016 11:30 10/15/2016 16:34 10/15/2016 20:39 10/16/2016 07:01  Glucose-Capillary Latest Ref Range: 65 - 99 mg/dL 93 664 (H) 403 (H) 474 (H) 194 (H)    Diabetes history:Type 2  Outpatient Diabetes medications: Lantus 39units qhs, Metformin 1000mg  bid, Novolog 7 units tid  Current orders for Inpatient glycemic control: Lantus 42units qhs, Metformin 1000mg  bid, Novolog 0-15 units tid, Novolog 0-5 units qhs, Novolog 5units tid  Inpatient Diabetes Program Recommendations:  Agree with current medications for blood sugar management.   , RN, BA, MHA, CDE Diabetes Coordinator Inpatient Diabetes Program  [phone] (Team Pager) [phone] [facility] Office) 10/16/2016 7:21 AM

## 2016-10-16 NOTE — Progress Notes (Signed)
1:1 observation note:  Patient has sleeping majority of day.  He has been getting up and going to meals.  He has been pleasant with staff.  He remains a 1:1 due to prior aggressive behavior.  No aggressive behavior noted today.

## 2016-10-16 NOTE — Progress Notes (Signed)
Patient ID: Tony Cunningham, male   DOB: Apr 22, 1976, 41 y.o.   MRN: [id number] Isolates to room, pleasant on approach, no delusions, no confusion, no hallucinations observed or verbalized, "nothing much, I'm doing pretty good, I can say A+...." CBG=279, Lantus given as scheduled, denied pain.

## 2016-10-16 NOTE — [facility] Group Notes (Signed)
[facility] Group Notes:  (Nursing/MHT/Case Management/Adjunct)  Date:  10/16/2016  Time:  6:13 AM  Type of Therapy:  Psychoeducational Skills  Participation Level:  Did Not Attend   Summary of Progress/Problems:  Chancy Milroy 10/16/2016, 6:13 AM

## 2016-10-16 NOTE — Tx Team (Signed)
Interdisciplinary Treatment and Diagnostic Plan Update  10/16/2016 Time of Session: 11:00am Tony Cunningham MRN: [id number]  Principal Diagnosis: Schizoaffective disorder, depressive type (HCC)  Secondary Diagnoses: Principal Problem:   Schizoaffective disorder, depressive type (HCC) Active Problems:   Diabetes (HCC)   HTN (hypertension)   Tobacco use disorder   Dyslipidemia   Asthma   Tardive dyskinesia   Polysubstance abuse   Current Medications:  Current Facility-Administered Medications  Medication Dose Route Frequency Provider Last Rate Last Dose  . acetaminophen (TYLENOL) tablet 650 mg  650 mg Oral Q6H PRN [name], [name], MD   650 mg at 10/13/16 0156  . alum & mag hydroxide-simeth (MAALOX/MYLANTA) 200-200-20 MG/5ML suspension 30 mL  30 mL Oral Q4H PRN [name], [name], MD      . cloZAPine (CLOZARIL) tablet 100 mg  100 mg Oral QHS Hernandez-Gonzalez, Sue Lush, MD      . dextrose (GLUTOSE) 40 % oral gel 37.5 g  1 Tube Oral Once PRN [name], MD      . fluvoxaMINE (LUVOX) tablet 50 mg  50 mg Oral BID [name], MD      . insulin aspart (novoLOG) injection 0-15 Units  0-15 Units Subcutaneous TID WC [name], MD   2 Units at 10/16/16 1141  . insulin aspart (novoLOG) injection 0-5 Units  0-5 Units Subcutaneous QHS [name], MD   3 Units at 10/15/16 2200  . insulin aspart (novoLOG) injection 5 Units  5 Units Subcutaneous TID WC [name], MD   5 Units at 10/16/16 1138  . insulin glargine (LANTUS) injection 42 Units  42 Units Subcutaneous QHS [name], MD   42 Units at 10/15/16 2200  . ipratropium (ATROVENT) 0.06 % nasal spray 1 spray  1 spray Each Nare QPC supper [name], MD   1 spray at 10/15/16 1728  . lidocaine (LIDODERM) 5 % 1 patch  1 patch Transdermal Q24H [name], MD   1 patch at 10/16/16 1138  . lisinopril (PRINIVIL,ZESTRIL) tablet 20 mg  20  mg Oral Daily [name], [name], MD   20 mg at 10/16/16 0824  . magnesium hydroxide (MILK OF MAGNESIA) suspension 30 mL  30 mL Oral Daily PRN [name], [name], MD      . meloxicam (MOBIC) tablet 7.5 mg  7.5 mg Oral BID [name], MD   7.5 mg at 10/16/16 0824  . metFORMIN (GLUCOPHAGE) tablet 1,000 mg  1,000 mg Oral BID WC [name], [name], MD   1,000 mg at 10/16/16 0824  . montelukast (SINGULAIR) tablet 10 mg  10 mg Oral Daily [name], MD   10 mg at 10/16/16 0824  . nicotine (NICODERM CQ - dosed in mg/24 hours) patch 21 mg  21 mg Transdermal Daily [name], MD   21 mg at 10/16/16 0825  . polyethylene glycol (MIRALAX / GLYCOLAX) packet 17 g  17 g Oral Daily [name], MD   17 g at 10/16/16 5697  . propranolol (INDERAL) tablet 20 mg  20 mg Oral BID [name], MD   20 mg at 10/16/16 0824  . simvastatin (ZOCOR) tablet 40 mg  40 mg Oral QHS [name], MD   40 mg at 10/15/16 1727   PTA Medications: Prescriptions Prior to Admission  Medication Sig Dispense Refill Last Dose  . amantadine (SYMMETREL) 100 MG capsule Take 1 capsule (100 mg total) by mouth 2 (two) times daily. 60 capsule 0 10/07/2016 at Unknown time  . diazepam (VALIUM) 10 MG tablet Take  1 tablet (10 mg total) by mouth daily. Give daily with supper 30 tablet 0 10/07/2016 at Unknown time  . gabapentin (NEURONTIN) 300 MG capsule Take 1 capsule (300 mg total) by mouth 3 (three) times daily. 90 capsule 0 10/07/2016 at Unknown time  . hydrALAZINE (APRESOLINE) 25 MG tablet Take 1 tablet (25 mg total) by mouth every 8 (eight) hours. 90 tablet 0 10/07/2016 at Unknown time  . insulin aspart (NOVOLOG) 100 UNIT/ML injection Inject 7 Units into the skin 3 (three) times daily with meals. 7 mL 0 10/07/2016 at Unknown time  . insulin glargine (LANTUS) 100 UNIT/ML injection Inject 0.39 mLs (39 Units total) into the skin at bedtime. 12 mL 0 10/07/2016 at Unknown time   . lisinopril (PRINIVIL,ZESTRIL) 20 MG tablet Take 1 tablet (20 mg total) by mouth daily. 30 tablet 0 10/07/2016 at Unknown time  . lurasidone (LATUDA) 80 MG TABS tablet Take 1 tablet (80 mg total) by mouth daily with supper. 30 tablet 0 10/07/2016 at Unknown time  . meloxicam (MOBIC) 7.5 MG tablet Take 7.5 mg by mouth daily.   10/07/2016 at Unknown time  . metFORMIN (GLUCOPHAGE) 1000 MG tablet Take 1 tablet (1,000 mg total) by mouth 2 (two) times daily with a meal. 60 tablet 0 10/07/2016 at Unknown time  . montelukast (SINGULAIR) 10 MG tablet Take 1 tablet (10 mg total) by mouth at bedtime. For Asthma 30 tablet 0 10/07/2016 at Unknown time  . simvastatin (ZOCOR) 40 MG tablet Take 1 tablet (40 mg total) by mouth at bedtime. For high cholesterol 15 tablet 0 10/07/2016 at Unknown time  . venlafaxine XR (EFFEXOR-XR) 150 MG 24 hr capsule Take 1 capsule (150 mg total) by mouth daily with breakfast. 30 capsule 0 10/07/2016 at Unknown time  . cyclobenzaprine (FLEXERIL) 10 MG tablet Take 1 tablet (10 mg total) by mouth 3 (three) times daily as needed for muscle spasms. 60 tablet 0 10/07/2016 at Unknown time    Patient Stressors: Health problems Loss of support system  Patient Strengths: Ability for insight Average or above average intelligence Communication skills Motivation for treatment/growth  Treatment Modalities: Medication Management, Group therapy, Case management,  1 to 1 session with clinician, Psychoeducation, Recreational therapy.   Physician Treatment Plan for Primary Diagnosis: Schizoaffective disorder, depressive type (HCC) Long Term Goal(s): Improvement in symptoms so as ready for discharge Improvement in symptoms so as ready for discharge   Short Term Goals: Ability to verbalize feelings will improve Ability to demonstrate self-control will improve Ability to identify and develop effective coping behaviors will improve Ability to identify and develop effective coping behaviors will  improve Ability to identify triggers associated with substance abuse/mental health issues will improve  Medication Management: Evaluate patient's response, side effects, and tolerance of medication regimen.  Therapeutic Interventions: 1 to 1 sessions, Unit Group sessions and Medication administration.  Evaluation of Outcomes: Progressing  Physician Treatment Plan for Secondary Diagnosis: Principal Problem:   Schizoaffective disorder, depressive type (HCC) Active Problems:   Diabetes (HCC)   HTN (hypertension)   Tobacco use disorder   Dyslipidemia   Asthma   Tardive dyskinesia   Polysubstance abuse  Long Term Goal(s): Improvement in symptoms so as ready for discharge Improvement in symptoms so as ready for discharge   Short Term Goals: Ability to verbalize feelings will improve Ability to demonstrate self-control will improve Ability to identify and develop effective coping behaviors will improve Ability to identify and develop effective coping behaviors will improve Ability to identify triggers associated  with substance abuse/mental health issues will improve     Medication Management: Evaluate patient's response, side effects, and tolerance of medication regimen.  Therapeutic Interventions: 1 to 1 sessions, Unit Group sessions and Medication administration.  Evaluation of Outcomes: Progressing   RN Treatment Plan for Primary Diagnosis: Schizoaffective disorder, depressive type (HCC) Long Term Goal(s): Knowledge of disease and therapeutic regimen to maintain health will improve  Short Term Goals: Ability to identify and develop effective coping behaviors will improve  Medication Management: RN will administer medications as ordered by provider, will assess and evaluate patient's response and provide education to patient for prescribed medication. RN will report any adverse and/or side effects to prescribing provider.  Therapeutic Interventions: 1 on 1 counseling sessions,  Psychoeducation, Medication administration, Evaluate responses to treatment, Monitor vital signs and CBGs as ordered, Perform/monitor CIWA, COWS, AIMS and Fall Risk screenings as ordered, Perform wound care treatments as ordered.  Evaluation of Outcomes: Progressing   LCSW Treatment Plan for Primary Diagnosis: Schizoaffective disorder, depressive type (HCC) Long Term Goal(s): Safe transition to appropriate next level of care at discharge, Engage patient in therapeutic group addressing interpersonal concerns.  Short Term Goals: Engage patient in aftercare planning with referrals and resources and Increase skills for wellness and recovery  Therapeutic Interventions: Assess for all discharge needs, 1 to 1 time with Social worker, Explore available resources and support systems, Assess for adequacy in community support network, Educate family and significant other(s) on suicide prevention, Complete Psychosocial Assessment, Interpersonal group therapy.  Evaluation of Outcomes: Progressing   Progress in Treatment: Attending groups: No. Participating in groups: No. Taking medication as prescribed: Yes. Toleration medication: Yes. Family/Significant other contact made: Yes, individual(s) contacted:  mother Patient understands diagnosis: Yes. Discussing patient identified problems/goals with staff: Yes. Medical problems stabilized or resolved: Yes. Denies suicidal/homicidal ideation: Yes. Issues/concerns per patient self-inventory: No. Other: n/a  New problem(s) identified: None identified at this time.   New Short Term/Long Term Goal(s): None identified at this time.   Discharge Plan or Barriers: Patient will return to day program at [facility].   Reason for Continuation of Hospitalization: Medication stabilization  Estimated Length of Stay: 5 to 7 days.   Attendees: Patient: 10/16/2016 12:09 PM  Physician: Dr. Radene JourneyJayme Cloud, MD 10/16/2016 12:09 PM  Nursing: Horton Marshall, RN  10/16/2016 12:09 PM  RN Care Manager: 10/16/2016 12:09 PM  Social Worker: Fredrich Birks. Garnette Czech MSW, LCSWA 10/16/2016 12:09 PM  Recreational Therapist: Jacquelynn Cree, LRT/CTRS 10/16/2016 12:09 PM  Other:  10/16/2016 12:09 PM  Other:  10/16/2016 12:09 PM  Other: 10/16/2016 12:09 PM    Scribe for Treatment Team: Arelia Longest, LCSWA 10/16/2016 12:09 PM

## 2016-10-16 NOTE — Progress Notes (Signed)
D: Patient is interacting with staff.  He continues to remains isolative to room, however, his interaction has improved.  Patient is sleeping well.  He has a PT consult ordered due to continuing neck pain and stiffness.  Called PT and Tawanna Cooler advised they had consulted on him on the 18th.  Patient was given some exercised to do and no further evaluation was recommended.  Will advised MD of same.  Patient denies any thoughts of self harm.  Patient has not attended any groups.   A: Continue to monitor medication management and MD orders.  Safety checks completed every 15 minutes per protocol.  Offer support and encouragement as needed. R: Patient is receptive to staff; his behavior is appropriate.

## 2016-10-16 NOTE — Progress Notes (Signed)
[facility] MD Progress Note  10/16/2016 8:41 AM Tony Cunningham  MRN:  [id number]  Subjective:   41 year old single male with history of schizoaffective disorder depressed type patient came voluntarily to our emergency department in the company of mobile crisis unit. He was complaining of hearing voices which were commanding him to harm himself. Patient reported that he was thinking about killing himself by running into traffic or drowning himself.  10/10/16:The patient has been isolative to his room this morning and had minimal interaction with staff and peers. He did eventually come out of his room for a little while but did not interact with peers. He was feeling very lethargic and tired this morning. He denies any current active or passive suicidal thoughts or psychotic symptoms. He says he does not want to go back to the group home because the other residents there are mean to him. He did not answer all questions asked that she was falling asleep and lethargic during the visit. He has been compliant with psychotropic medications. He slept over 8 hours last night. Blood pressure was slightly elevated this morning. Blood sugars have become more controlled compared to the time of admission.  10/11/16:  The patient reports that his anxiety has decreased with the Clozaril and that is the only positive changes noticed with the medication. He is not as sedated or tired this morning compared to yesterday. He did get up for breakfast but otherwise has been isolative to his room. He did not go outside with the other patients and has not been attending groups. The patient gave very refer responses to questions asked. He denies any current active or passive suicidal thoughts or hallucinations. He denied paranoid thoughts but did express paranoid thoughts about the group home and people being upset with him for calling the ambulance. He denies any somatic complaints. Blood sugars have been elevated. Per nursing, he slept  7.45 hours last night but has been sleeping intermittently throughout the daytime. Vital signs are stable.  5/21 patient has been withdrawn to his room. Minimal participation in programming. Tells me he is not suicidal, homicidal and does not have hallucinations. He reports feeling well denies problems with appetite, energy or concentration. Says that  he is not sleeping well throughout the night. Nurses told me he has been is sleeping all throughout the day. Patient denies lightheadedness, constipation, drooling or dizziness.  5/22 withdrawn to his room. Sleeping during the daytime. Not going to groups. Poor hygiene and grooming. He denies having any lightheadedness, dizziness, constipation or chest pain. He does complain of drooling. He denies suicidality, homicidality or hallucinations. He says that he is having thoughts about pushing his mother but he will never do that because she is his angel. Patient makes some statements that appeared to be unrelated to the questions. This is not sleeping well Tony Cunningham is sleeping a lot throughout the day). He does complain of some mild sedation. During assessment he did not appear sedated  5/23 without change. He is not participating in any groups. He is hygiene and grooming are poor. She stays in bed all day on him in his room for meals. He denies to me having any dizziness, lightheadedness, chest pain, constipation. He does complain of feeling a little bit tired. Denies suicidality, homicidality or auditory or visual hallucinations.   5/24 pairs staff the patient was overly sedated yesterday. To the point that he was falling asleep while eating. He has been withdrawn and stays in his room most of the time.  I will decrease the Clozaril dose to 75 mg tonight. Clozaril level is currently pending. Patient says that he feels a little bit better than before. He does complain of feeling tired. Other than that he denies any lightheadedness, dizziness, chest pain,  constipation or sialorrhea. He denies SI, HI or auditory or visual hallucinations. He does tell me he continues to have thoughts that he would rather go to sleep and not wake up  5/25 [name] was able to say that he was better today. He seems less sedated than before. He also has better grooming and hygiene today. He denies having suicidality or homicidality. He denies hallucinations. He denies side effects from medications. He denies any physical complaints. He has been cooperative with medications. Still withdrawn to not attending groups.  Per nursing: Isolates to room, pleasant on approach, no delusions, no confusion, no hallucinations observed or verbalized, "nothing much, I'm doing pretty good, I can say A+...." CBG=279, Lantus given as scheduled, denied pain.  Affect brighter today.  Less lethargic.  Continues to isolate to room.  Denies SI/HI/AVH.  Mediation compliant. Support offered.  Safety maintained.    Principal Problem: Schizoaffective disorder, depressive type (HCC) Diagnosis:   Patient Active Problem List   Diagnosis Date Noted  . Schizoaffective disorder, depressive type (HCC) [F25.1] 09/23/2016  . Polysubstance abuse [F19.10] 07/17/2016  . Tardive dyskinesia [G24.01] 07/16/2016  . Tobacco use disorder [F17.200] 07/07/2016  . Dyslipidemia [E78.5] 07/07/2016  . Asthma [J45.909] 07/07/2016  . HTN (hypertension) [I10] 07/06/2016  . Diabetes (HCC) [E11.9] 12/25/2010   Total Time spent with patient: 30 minutes  Past Psychiatric History: Patient has a long history of mental illness and has had multiple hospitalizations including multiple hospitalizations in [place]. Most of those records are unavailable to me. 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 Not sure whether he ever returns to an adequate baseline. He tells me that he has tried to cut himself and kill himself in the past. Denies any history of violence. He can't  remember the names of any of his medicines. Outpatient medicines listed on admission included Abilify and Effexor. Also hospitalized at [facility], [place].  Past Medical History:  Past Medical History:  Diagnosis Date  . Anxiety   . Asthma   . Diabetes mellitus   . High blood pressure   . Sinus complaint    History reviewed. No pertinent surgical history.   Family History: History reviewed. No pertinent family history.   Family Psychiatric  History: None known   Social History: Living in a group home. Has been there about 4-6 months he estimates. Says that he does not like it. 11 grade education. Does not have a legal guardian  History  Alcohol Use No     History  Drug Use No    Social History   Social History  . Marital status: Single    Spouse name: [name]  . Number of children: [name]  . Years of education: [name]   Social History Main Topics  . Smoking status: Current Every Day Smoker    Packs/day: 0.50    Types: Cigarettes  . Smokeless tobacco: Never Used  . Alcohol use No  . Drug use: No  . Sexual activity: Not Currently   Other Topics Concern  . None   Social History Narrative  . None     Current Medications: Current Facility-Administered Medications  Medication Dose Route Frequency Provider Last Rate Last Dose  .  acetaminophen (TYLENOL) tablet 650 mg  650 mg Oral Q6H PRN [name], [name], MD   650 mg at 10/13/16 0156  . alum & mag hydroxide-simeth (MAALOX/MYLANTA) 200-200-20 MG/5ML suspension 30 mL  30 mL Oral Q4H PRN [name], [name], MD      . cloZAPine (CLOZARIL) tablet 75 mg  75 mg Oral QHS [name], MD   75 mg at 10/15/16 1728  . dextrose (GLUTOSE) 40 % oral gel 37.5 g  1 Tube Oral Once PRN [name], MD      . fluvoxaMINE (LUVOX) tablet 50 mg  50 mg Oral QHS [name], MD   50 mg at 10/15/16 1728  . insulin aspart (novoLOG) injection 0-15 Units  0-15 Units Subcutaneous TID WC [name], MD   3 Units at 10/16/16 0825  . insulin aspart (novoLOG) injection 0-5 Units  0-5 Units Subcutaneous QHS [name], MD   3 Units at 10/15/16 2200  . insulin aspart (novoLOG) injection 5 Units  5 Units Subcutaneous TID WC [name], MD   5 Units at 10/16/16 [phone]  . insulin glargine (LANTUS) injection 42 Units  42 Units Subcutaneous QHS [name], MD   42 Units at 10/15/16 2200  . ipratropium (ATROVENT) 0.06 % nasal spray 1 spray  1 spray Each Nare QPC supper [name], MD   1 spray at 10/15/16 1728  . lidocaine (LIDODERM) 5 % 1 patch  1 patch Transdermal Q24H [name], MD   1 patch at 10/15/16 1150  . lisinopril (PRINIVIL,ZESTRIL) tablet 20 mg  20 mg Oral Daily [name], [name], MD   20 mg at 10/16/16 0824  . magnesium hydroxide (MILK OF MAGNESIA) suspension 30 mL  30 mL Oral Daily PRN [name], [name], MD      . meloxicam (MOBIC) tablet 7.5 mg  7.5 mg Oral BID [name], MD   7.5 mg at 10/16/16 0824  . metFORMIN (GLUCOPHAGE) tablet 1,000 mg  1,000 mg Oral BID WC [name], [name], MD   1,000 mg at 10/16/16 0824  . montelukast (SINGULAIR) tablet 10 mg  10 mg Oral Daily [name], MD   10 mg at 10/16/16 0824  . nicotine (NICODERM CQ - dosed in mg/24 hours) patch 21 mg  21 mg Transdermal Daily [name], MD   21 mg at 10/16/16 0825  . polyethylene glycol (MIRALAX / GLYCOLAX) packet 17 g  17 g Oral Daily [name], MD   17 g at 10/16/16 9604  . propranolol (INDERAL) tablet 20 mg  20 mg Oral BID [name], MD   20 mg at 10/16/16 0824  . simvastatin (ZOCOR) tablet 40 mg  40 mg Oral QHS [name], MD   40 mg at 10/15/16 1727    Lab Results:  Results for orders placed or performed during the hospital encounter of 10/08/16 (from the past 48 hour(s))  Glucose, capillary     Status: None   Collection Time: 10/14/16  11:34 AM  Result Value Ref Range   Glucose-Capillary 98 65 - 99 mg/dL   Comment 1 Notify RN   Clozapine (clozaril)     Status: Abnormal   Collection Time: 10/14/16  3:13 PM  Result Value Ref Range   Clozapine Lvl 212 (L) 350 - 650 ng/mL    Comment:               **Please note reference interval change**   NorClozapine 88 Not Estab. ng/mL   Total(Cloz+Norcloz) 300 ng/mL  Comment: (NOTE) Patients dosed with 400 mg clozapine daily for 4 weeks were most likely to exhibit a therapeutic effect when the sum of clozapine and norclozapine concentrations were at least 450 ng/mL. Charlott Rakes, et al. Juel Burrow Consensus Guidelines for Therapeutic Drug Monitoring in Psychiatry: Update 2011, Pharmacopsychiatry Sep 2011; 44(6):195-235.                                Detection Limit = 20 Performed At: [facility] [address] [place], [place] [id number] Mila Homer MD WU:[id number]   Glucose, capillary     Status: Abnormal   Collection Time: 10/14/16  4:09 PM  Result Value Ref Range   Glucose-Capillary 101 (H) 65 - 99 mg/dL  Glucose, capillary     Status: Abnormal   Collection Time: 10/14/16  8:36 PM  Result Value Ref Range   Glucose-Capillary 55 (L) 65 - 99 mg/dL  Glucose, capillary     Status: Abnormal   Collection Time: 10/14/16 10:28 PM  Result Value Ref Range   Glucose-Capillary 159 (H) 65 - 99 mg/dL  Glucose, capillary     Status: None   Collection Time: 10/15/16  6:59 AM  Result Value Ref Range   Glucose-Capillary 93 65 - 99 mg/dL  Troponin I     Status: None   Collection Time: 10/15/16 10:00 AM  Result Value Ref Range   Troponin I <0.03 <0.03 ng/mL  Glucose, capillary     Status: Abnormal   Collection Time: 10/15/16 11:30 AM  Result Value Ref Range   Glucose-Capillary 354 (H) 65 - 99 mg/dL  Glucose, capillary     Status: Abnormal   Collection Time: 10/15/16  4:34 PM  Result Value Ref Range   Glucose-Capillary 198 (H) 65 - 99 mg/dL  Glucose,  capillary     Status: Abnormal   Collection Time: 10/15/16  8:39 PM  Result Value Ref Range   Glucose-Capillary 279 (H) 65 - 99 mg/dL  Glucose, capillary     Status: Abnormal   Collection Time: 10/16/16  7:01 AM  Result Value Ref Range   Glucose-Capillary 194 (H) 65 - 99 mg/dL  CBC with Differential/Platelet     Status: None   Collection Time: 10/16/16  7:55 AM  Result Value Ref Range   WBC 6.8 3.8 - 10.6 K/uL   RBC 4.79 4.40 - 5.90 MIL/uL   Hemoglobin 15.5 13.0 - 18.0 g/dL   HCT 95.6 21.3 - 08.6 %   MCV 92.1 80.0 - 100.0 fL   MCH 32.3 26.0 - 34.0 pg   MCHC 35.0 32.0 - 36.0 g/dL   RDW 57.8 46.9 - 62.9 %   Platelets 197 150 - 440 K/uL   Neutrophils Relative % 38 %   Neutro Abs 2.6 1.4 - 6.5 K/uL   Lymphocytes Relative 49 %   Lymphs Abs 3.3 1.0 - 3.6 K/uL   Monocytes Relative 7 %   Monocytes Absolute 0.5 0.2 - 1.0 K/uL   Eosinophils Relative 5 %   Eosinophils Absolute 0.4 0 - 0.7 K/uL   Basophils Relative 1 %   Basophils Absolute 0.1 0 - 0.1 K/uL    Blood Alcohol level:  Lab Results  Component Value Date   [facility] <5 10/07/2016   ETH <5 09/23/2016    Metabolic Disorder Labs: Lab Results  Component Value Date   HGBA1C 11.2 (H) 10/09/2016   MPG 275 10/09/2016  MPG 275 09/24/2016   Lab Results  Component Value Date   PROLACTIN 24.5 (H) 09/24/2016   PROLACTIN 3.4 (L) 07/07/2016   Lab Results  Component Value Date   CHOL 127 10/09/2016   TRIG 80 10/09/2016   HDL 39 (L) 10/09/2016   CHOLHDL 3.3 10/09/2016   VLDL 16 10/09/2016   LDLCALC 72 10/09/2016   LDLCALC 56 07/07/2016     Musculoskeletal: Strength & Muscle Tone: within normal limits Gait & Station: normal Patient leans: [name]  Psychiatric Specialty Exam: Physical Exam  Nursing note and vitals reviewed. Constitutional: He is oriented to person, place, and time. He appears well-developed and well-nourished.  HENT:  Head: Normocephalic and atraumatic.  Eyes: Conjunctivae and EOM are normal.  Neck:  Normal range of motion.  Respiratory: Effort normal.  Neurological: He is alert and oriented to person, place, and time. He displays no tremor.    Review of Systems  Constitutional: Negative.  Negative for chills, fever and weight loss.  HENT: Negative.  Negative for congestion, hearing loss and sore throat.   Eyes: Negative.  Negative for blurred vision and double vision.  Respiratory: Negative.  Negative for cough, hemoptysis, sputum production and shortness of breath.   Cardiovascular: Negative.  Negative for chest pain and palpitations.  Gastrointestinal: Negative for abdominal pain, constipation, diarrhea, heartburn, nausea and vomiting.  Genitourinary: Negative.  Negative for dysuria, frequency and urgency.  Musculoskeletal: Positive for neck pain. Negative for back pain, joint pain and myalgias.  Skin: Negative.  Negative for rash.  Neurological: Positive for tremors. Negative for dizziness, tingling, sensory change, speech change and headaches.  Endo/Heme/Allergies: Negative.  Negative for environmental allergies. Does not bruise/bleed easily.  Psychiatric/Behavioral: Positive for depression. Negative for hallucinations, memory loss, substance abuse and suicidal ideas. The patient is nervous/anxious. The patient does not have insomnia.     Blood pressure 105/75, pulse 82, temperature 98.6 F (37 C), temperature source Oral, resp. rate 18, height 5\' 9"  (1.753 m), weight 73.9 kg (163 lb), SpO2 100 %.Body mass index is 24.07 kg/m.  General Appearance: Disheveled  Eye Contact:  Good  Speech:  Slow  Volume:  Decreased  Mood:  Dysphoric  Affect:  Constricted  Thought Process:  Linear and Descriptions of Associations: Intact Concrete  Orientation:  NA  Thought Content:  Logical  Suicidal Thoughts:  No  Homicidal Thoughts:  No  Memory:  Immediate;   Fair Recent;   Poor Remote;   Poor  Judgement:  Poor  Insight:  Lacking  Psychomotor Activity:  Normal  Concentration:   Concentration: Fair and Attention Span: Fair  Recall:  Poor  Fund of Knowledge:  Poor  Language:  Fair  Akathisia:  No  Handed:    AIMS (if indicated):     Assets:    ADL's:  Intact  Cognition:  Impaired,  Mild  Sleep:  Number of Hours: 7.3     Treatment Plan Summary: 41 y/o male with depression, SI and hallucinations. Multiple prior hospitalization and prior suicidal attempts. Just d/c last week.  Per review of records patient has history of abusing opiates, Lyrica, Remeron and Flexeril. Here he has been frequently asking for Ativan or clonazepam.  Schizoaffective d/o depressed type: multiple failures to antipsychotics. Pt frequently hospitalized.   -Currently on a trial of Clozaril. Level on 5/23 was 212 (low) but too sedated with 150 mg qhs.  Will change clozaril to 100 mg at 1700  -For clozaril augmentation he is on Luvox 50 mg bid  Sialorrhea: Continue atrovent sublingual at bedtime  Akathisia/TD: Amantadine was discontinued for now. Clozaril should improve tardive dyskinesia  Tremors: continue propranolol 20 mg twice a day  Insomnia: Will target insomnia with Clozaril--- sleeping well  History of abusing opiates, Flexeril, Lyrica and mirtazapine. We'll benefit from continuing outpatient substance abuse services.   For diabetes: Continue Lantus 42 units daily at bedtime. Continue NovoLog but will decrease to 5 units 3 times a day with meals. Continue metformin 1000 mg twice a day. Will place orders for supplemental insulin. Had an episode of hypoglycemia last night. Appears that he skipped dinner  Hypertension: Continue lisinopril 20 mg a day and propanolol 20 mg twice a day.  BP and HR wnl  Dyslipidemia continue Zocor 40 mg at 1700  Asthma continue Singulair 10 mg q daily  Neck pain: Continue Mobic 7.5 mg bid. Continue lidocaine patch to neck. Multiple visits to the ER complaining of neck pain.  PT pending  Constipation:continue miralax daily    Tobacco use disorder: continue nicotine patch 21 mg a day  Labs: hemoglobin A1c, lipid panel, TSH all completed---we had all of these from Feb 2018  Diet carb modified and low sodium  Disposition:  back to his group home once stable   Follow up: continue to f/u with [facility] in [place]. Continue day program.  Will refer for case coordination    EKG completed  Consults: consulted DM coordinator, dietician and PT   [name], MD 10/16/2016, 8:41 AM

## 2016-10-16 NOTE — Plan of Care (Signed)
Problem: Education: Goal: Mental status will improve Outcome: Progressing Patient is interacting well with staff.  He remains isolative to his room during the day.  He is compliant with his medications.

## 2016-10-16 NOTE — Progress Notes (Signed)
Pharmacy Consult for Clozapine Monitoring:  41 yo M to start a trial of Clozapine by Dr. Huntley Dec.   PER MD note 10/09/16- Schizoaffective d/o depressed type: multiple failures to antipsychotics. Pt frequently hospitalized.  Will start a trial of clozaril. Start 50 mg qhs.  For depressive Symptoms and we will start Luvox 50 mg daily at bedtime. Luvox will also increase the levels of Clozaril. By doing these we will need to lower doses of Clozaril for therapeutic effect. How will start tapering off Effexor. I will decrease his dose today to 75 mg Continued Latuda 80 mg for now however I plan to cross taper this medication with Clozaril. Likely Kasandra Knudsen will be discontinued prior to discharge  Patient  was registered in the clozapine [facility] program and will get weekly monitoring.  5/25 Current regimine is Clozaril 75mg  daily plus Luvox 50mg  daily.  Clozapine level is 212   5/18  ANC= 3.3 prior to beginning Clozaril 5/25 ANC  2.6  Next labs due 10/23/16  6/25, RPh 10/16/2016

## 2016-10-16 NOTE — Progress Notes (Signed)
1:1 observation note:  Patient continues to sleep during the day.  He went to day room for dinner.  He has been observed pacing the hallway mumbling to himself.  He appears to be responding to internal stimuli.  Patient has been pleasant with staff.  No aggressive behavior noted today.  Patient continues on 1:1 observation due to prior aggressive behavior.

## 2016-10-16 NOTE — Plan of Care (Signed)
Problem: Activity: Goal: Sleeping patterns will improve Outcome: Progressing Patient slept for Estimated Hours of 7.30; every 15 minutes safety round maintained, no injury or falls during this shift.    

## 2016-10-16 NOTE — [facility] Group Notes (Signed)
[facility] LCSW Group Therapy   10/16/2016 1 PM   Type of Therapy: Group Therapy   Participation Level: Pt invited but did not attend.    Hampton Abbot, MSW, LCSWA 10/16/2016, 1:57PM

## 2016-10-17 LAB — GLUCOSE, CAPILLARY
GLUCOSE-CAPILLARY: 214 mg/dL — AB (ref 65–99)
Glucose-Capillary: 259 mg/dL — ABNORMAL HIGH (ref 65–99)
Glucose-Capillary: 304 mg/dL — ABNORMAL HIGH (ref 65–99)
Glucose-Capillary: 338 mg/dL — ABNORMAL HIGH (ref 65–99)

## 2016-10-17 NOTE — Progress Notes (Addendum)
[facility] MD Progress Note  10/17/2016 2:52 PM Tony Cunningham  MRN:  [id number]  Subjective:   41 year old single male with history of schizoaffective disorder depressed type patient came voluntarily to our emergency department in the company of mobile crisis unit. He was complaining of hearing voices which were commanding him to harm himself. Patient reported that he was thinking about killing himself by running into traffic or drowning himself.  10/10/16:The patient has been isolative to his room this morning and had minimal interaction with staff and peers. He did eventually come out of his room for a little while but did not interact with peers. He was feeling very lethargic and tired this morning. He denies any current active or passive suicidal thoughts or psychotic symptoms. He says he does not want to go back to the group home because the other residents there are mean to him. He did not answer all questions asked that she was falling asleep and lethargic during the visit. He has been compliant with psychotropic medications. He slept over 8 hours last night. Blood pressure was slightly elevated this morning. Blood sugars have become more controlled compared to the time of admission.  10/11/16:  The patient reports that his anxiety has decreased with the Clozaril and that is the only positive changes noticed with the medication. He is not as sedated or tired this morning compared to yesterday. He did get up for breakfast but otherwise has been isolative to his room. He did not go outside with the other patients and has not been attending groups. The patient gave very refer responses to questions asked. He denies any current active or passive suicidal thoughts or hallucinations. He denied paranoid thoughts but did express paranoid thoughts about the group home and people being upset with him for calling the ambulance. He denies any somatic complaints. Blood sugars have been elevated. Per nursing, he slept  7.45 hours last night but has been sleeping intermittently throughout the daytime. Vital signs are stable.  5/21 patient has been withdrawn to his room. Minimal participation in programming. Tells me he is not suicidal, homicidal and does not have hallucinations. He reports feeling well denies problems with appetite, energy or concentration. Says that  he is not sleeping well throughout the night. Nurses told me he has been is sleeping all throughout the day. Patient denies lightheadedness, constipation, drooling or dizziness.  5/22 withdrawn to his room. Sleeping during the daytime. Not going to groups. Poor hygiene and grooming. He denies having any lightheadedness, dizziness, constipation or chest pain. He does complain of drooling. He denies suicidality, homicidality or hallucinations. He says that he is having thoughts about pushing his mother but he will never do that because she is his angel. Patient makes some statements that appeared to be unrelated to the questions. This is not sleeping well Tony Cunningham is sleeping a lot throughout the day). He does complain of some mild sedation. During assessment he did not appear sedated  5/23 without change. He is not participating in any groups. He is hygiene and grooming are poor. She stays in bed all day on him in his room for meals. He denies to me having any dizziness, lightheadedness, chest pain, constipation. He does complain of feeling a little bit tired. Denies suicidality, homicidality or auditory or visual hallucinations.   5/24 pairs staff the patient was overly sedated yesterday. To the point that he was falling asleep while eating. He has been withdrawn and stays in his room most of the time.  I will decrease the Clozaril dose to 75 mg tonight. Clozaril level is currently pending. Patient says that he feels a little bit better than before. He does complain of feeling tired. Other than that he denies any lightheadedness, dizziness, chest pain,  constipation or sialorrhea. He denies SI, HI or auditory or visual hallucinations. He does tell me he continues to have thoughts that he would rather go to sleep and not wake up  5/25 Tony Cunningham was able to say that he was better today. He seems less sedated than before. He also has better grooming and hygiene today. He denies having suicidality or homicidality. He denies hallucinations. He denies side effects from medications. He denies any physical complaints. He has been cooperative with medications. Still withdrawn to not attending groups.  10/17/2016. Tony Cunningham has no complaints except for the tremor. Sleep and appetite are good. He no longer uses a wheelchair. Tolerates medications wee. He is mostly in his room and not engaging, not even going ouside when the weather is so pleasant. He is no longer 1:1. There are no behavioral problems.  Per nursing: 1:1 observation note:  Patient continues to sleep during the day.  He went to day room for dinner.  He has been observed pacing the hallway mumbling to himself.  He appears to be responding to internal stimuli.  Patient has been pleasant with staff.  No aggressive behavior noted today.  Patient continues on 1:1 observation due to prior aggressive behavior.  Principal Problem: Schizoaffective disorder, depressive type (HCC) Diagnosis:   Patient Active Problem List   Diagnosis Date Noted  . Schizoaffective disorder, depressive type (HCC) [F25.1] 09/23/2016  . Polysubstance abuse [F19.10] 07/17/2016  . Tardive dyskinesia [G24.01] 07/16/2016  . Tobacco use disorder [F17.200] 07/07/2016  . Dyslipidemia [E78.5] 07/07/2016  . Asthma [J45.909] 07/07/2016  . HTN (hypertension) [I10] 07/06/2016  . Diabetes (HCC) [E11.9] 12/25/2010   Total Time spent with patient: 30 minutes  Past Psychiatric History: Patient has a long history of mental illness and has had multiple hospitalizations including multiple hospitalizations in [place]. Most of those records  are unavailable to me. 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 Not sure whether he ever returns to an adequate baseline. He tells me that he has tried to cut himself and kill himself in the past. Denies any history of violence. He can't remember the names of any of his medicines. Outpatient medicines listed on admission included Abilify and Effexor. Also hospitalized at [facility], [place].  Past Medical History:  Past Medical History:  Diagnosis Date  . Anxiety   . Asthma   . Diabetes mellitus   . High blood pressure   . Sinus complaint    History reviewed. No pertinent surgical history.   Family History: History reviewed. No pertinent family history.   Family Psychiatric  History: None known   Social History: Living in a group home. Has been there about 4-6 months he estimates. Says that he does not like it. 11 grade education. Does not have a legal guardian  History  Alcohol Use No     History  Drug Use No    Social History   Social History  . Marital status: Single    Spouse name: [name]  . Number of children: [name]  . Years of education: [name]   Social History Main Topics  . Smoking status: Current Every Day Smoker    Packs/day: 0.50  Types: Cigarettes  . Smokeless tobacco: Never Used  . Alcohol use No  . Drug use: No  . Sexual activity: Not Currently   Other Topics Concern  . None   Social History Narrative  . None     Current Medications: Current Facility-Administered Medications  Medication Dose Route Frequency Provider Last Rate Last Dose  . acetaminophen (TYLENOL) tablet 650 mg  650 mg Oral Q6H PRN [name], [name], MD   650 mg at 10/13/16 0156  . alum & mag hydroxide-simeth (MAALOX/MYLANTA) 200-200-20 MG/5ML suspension 30 mL  30 mL Oral Q4H PRN [name], [name], MD      . cloZAPine (CLOZARIL) tablet 100 mg  100 mg Oral QHS [name], MD   100 mg at 10/16/16 1641  . dextrose  (GLUTOSE) 40 % oral gel 37.5 g  1 Tube Oral Once PRN [name], MD      . fluvoxaMINE (LUVOX) tablet 50 mg  50 mg Oral BID [name], MD   50 mg at 10/17/16 0924  . insulin aspart (novoLOG) injection 0-15 Units  0-15 Units Subcutaneous TID WC [name], MD   11 Units at 10/17/16 1237  . insulin aspart (novoLOG) injection 0-5 Units  0-5 Units Subcutaneous QHS [name], MD   4 Units at 10/16/16 2158  . insulin aspart (novoLOG) injection 5 Units  5 Units Subcutaneous TID WC [name], MD   5 Units at 10/17/16 1238  . insulin glargine (LANTUS) injection 42 Units  42 Units Subcutaneous QHS [name], MD   42 Units at 10/16/16 2158  . ipratropium (ATROVENT) 0.06 % nasal spray 1 spray  1 spray Each Nare QPC supper [name], MD   1 spray at 10/16/16 1643  . lidocaine (LIDODERM) 5 % 1 patch  1 patch Transdermal Q24H [name], MD   1 patch at 10/17/16 1233  . lisinopril (PRINIVIL,ZESTRIL) tablet 20 mg  20 mg Oral Daily [name], [name], MD   20 mg at 10/17/16 0926  . magnesium hydroxide (MILK OF MAGNESIA) suspension 30 mL  30 mL Oral Daily PRN [name], [name], MD      . meloxicam (MOBIC) tablet 7.5 mg  7.5 mg Oral BID [name], MD   7.5 mg at 10/17/16 0926  . metFORMIN (GLUCOPHAGE) tablet 1,000 mg  1,000 mg Oral BID WC [name], [name], MD   1,000 mg at 10/17/16 1610  . montelukast (SINGULAIR) tablet 10 mg  10 mg Oral Daily [name], MD   10 mg at 10/17/16 0926  . nicotine (NICODERM CQ - dosed in mg/24 hours) patch 21 mg  21 mg Transdermal Daily [name], MD   21 mg at 10/17/16 9604  . polyethylene glycol (MIRALAX / GLYCOLAX) packet 17 g  17 g Oral Daily [name], MD   17 g at 10/16/16 5409  . propranolol (INDERAL) tablet 20 mg  20 mg Oral BID [name], MD   20 mg at 10/17/16 0926  .  simvastatin (ZOCOR) tablet 40 mg  40 mg Oral QHS [name], MD   40 mg at 10/16/16 1641    Lab Results:  Results for orders placed or performed during the hospital encounter of 10/08/16 (from the past 48 hour(s))  Glucose, capillary     Status: Abnormal   Collection Time: 10/15/16  4:34 PM  Result Value Ref Range   Glucose-Capillary 198 (H) 65 - 99 mg/dL  Glucose, capillary     Status: Abnormal   Collection  Time: 10/15/16  8:39 PM  Result Value Ref Range   Glucose-Capillary 279 (H) 65 - 99 mg/dL  Glucose, capillary     Status: Abnormal   Collection Time: 10/16/16  7:01 AM  Result Value Ref Range   Glucose-Capillary 194 (H) 65 - 99 mg/dL  CBC with Differential/Platelet     Status: None   Collection Time: 10/16/16  7:55 AM  Result Value Ref Range   WBC 6.8 3.8 - 10.6 K/uL   RBC 4.79 4.40 - 5.90 MIL/uL   Hemoglobin 15.5 13.0 - 18.0 g/dL   HCT 50.3 88.8 - 28.0 %   MCV 92.1 80.0 - 100.0 fL   MCH 32.3 26.0 - 34.0 pg   MCHC 35.0 32.0 - 36.0 g/dL   RDW 03.4 91.7 - 91.5 %   Platelets 197 150 - 440 K/uL   Neutrophils Relative % 38 %   Neutro Abs 2.6 1.4 - 6.5 K/uL   Lymphocytes Relative 49 %   Lymphs Abs 3.3 1.0 - 3.6 K/uL   Monocytes Relative 7 %   Monocytes Absolute 0.5 0.2 - 1.0 K/uL   Eosinophils Relative 5 %   Eosinophils Absolute 0.4 0 - 0.7 K/uL   Basophils Relative 1 %   Basophils Absolute 0.1 0 - 0.1 K/uL  Glucose, capillary     Status: Abnormal   Collection Time: 10/16/16 11:35 AM  Result Value Ref Range   Glucose-Capillary 135 (H) 65 - 99 mg/dL  Glucose, capillary     Status: Abnormal   Collection Time: 10/16/16  4:38 PM  Result Value Ref Range   Glucose-Capillary 275 (H) 65 - 99 mg/dL  Glucose, capillary     Status: Abnormal   Collection Time: 10/16/16  8:53 PM  Result Value Ref Range   Glucose-Capillary 360 (H) 65 - 99 mg/dL  Glucose, capillary     Status: Abnormal   Collection Time: 10/17/16  6:45 AM  Result Value Ref Range    Glucose-Capillary 259 (H) 65 - 99 mg/dL   Comment 1 Document in Chart   Glucose, capillary     Status: Abnormal   Collection Time: 10/17/16 11:45 AM  Result Value Ref Range   Glucose-Capillary 304 (H) 65 - 99 mg/dL    Blood Alcohol level:  Lab Results  Component Value Date   ETH <5 10/07/2016   ETH <5 09/23/2016    Metabolic Disorder Labs: Lab Results  Component Value Date   HGBA1C 11.2 (H) 10/09/2016   MPG 275 10/09/2016   MPG 275 09/24/2016   Lab Results  Component Value Date   PROLACTIN 24.5 (H) 09/24/2016   PROLACTIN 3.4 (L) 07/07/2016   Lab Results  Component Value Date   CHOL 127 10/09/2016   TRIG 80 10/09/2016   HDL 39 (L) 10/09/2016   CHOLHDL 3.3 10/09/2016   VLDL 16 10/09/2016   LDLCALC 72 10/09/2016   LDLCALC 56 07/07/2016     Musculoskeletal: Strength & Muscle Tone: within normal limits Gait & Station: normal Patient leans: [name]  Psychiatric Specialty Exam: Physical Exam  Nursing note and vitals reviewed. Constitutional: He is oriented to person, place, and time. He appears well-developed and well-nourished.  HENT:  Head: Normocephalic and atraumatic.  Eyes: Conjunctivae and EOM are normal.  Neck: Normal range of motion.  Respiratory: Effort normal.  Neurological: He is alert and oriented to person, place, and time. He displays no tremor.  Psychiatric: His speech is normal. Thought content normal. His affect is blunt. He is slowed and  withdrawn. Cognition and memory are normal. He expresses impulsivity.    Review of Systems  Constitutional: Negative.  Negative for chills, fever and weight loss.  HENT: Negative.  Negative for congestion, hearing loss and sore throat.   Eyes: Negative.  Negative for blurred vision and double vision.  Respiratory: Negative.  Negative for cough, hemoptysis, sputum production and shortness of breath.   Cardiovascular: Negative.  Negative for chest pain and palpitations.  Gastrointestinal: Negative for abdominal pain,  constipation, diarrhea, heartburn, nausea and vomiting.  Genitourinary: Negative.  Negative for dysuria, frequency and urgency.  Musculoskeletal: Positive for neck pain. Negative for back pain, joint pain and myalgias.  Skin: Negative.  Negative for rash.  Neurological: Positive for tremors. Negative for dizziness, tingling, sensory change, speech change and headaches.  Endo/Heme/Allergies: Negative.  Negative for environmental allergies. Does not bruise/bleed easily.  Psychiatric/Behavioral: Positive for depression. Negative for hallucinations, memory loss, substance abuse and suicidal ideas. The patient is nervous/anxious. The patient does not have insomnia.     Blood pressure 105/72, pulse 68, temperature 98 F (36.7 C), resp. rate 18, height 5\' 9"  (1.753 m), weight 73.9 kg (163 lb), SpO2 100 %.Body mass index is 24.07 kg/m.  General Appearance: Disheveled  Eye Contact:  Good  Speech:  Slow  Volume:  Decreased  Mood:  Dysphoric  Affect:  Constricted  Thought Process:  Linear and Descriptions of Associations: Intact Concrete  Orientation:  NA  Thought Content:  Logical  Suicidal Thoughts:  No  Homicidal Thoughts:  No  Memory:  Immediate;   Fair Recent;   Poor Remote;   Poor  Judgement:  Poor  Insight:  Lacking  Psychomotor Activity:  Normal  Concentration:  Concentration: Fair and Attention Span: Fair  Recall:  Poor  Fund of Knowledge:  Poor  Language:  Fair  Akathisia:  No  Handed:    AIMS (if indicated):     Assets:    ADL's:  Intact  Cognition:  Impaired,  Mild  Sleep:  Number of Hours: 5.75     Treatment Plan Summary: 41 y/o male with depression, SI and hallucinations. Multiple prior hospitalization and prior suicidal attempts. Just d/c last week.  Per review of records patient has history of abusing opiates, Lyrica, Remeron and Flexeril. Here he has been frequently asking for Ativan or clonazepam.  Schizoaffective d/o depressed type: multiple failures to  antipsychotics. Pt frequently hospitalized.   -Currently on a trial of Clozaril. Level on 5/23 was 212 (low) but too sedated with 150 mg qhs.  Will change clozaril to 100 mg at 1700  -For clozaril augmentation he is on Luvox 50 mg bid  Sialorrhea: Continue atrovent sublingual at bedtime  Akathisia/TD: Amantadine was discontinued for now. Clozaril should improve tardive dyskinesia  Tremors: continue propranolol 20 mg twice a day  Insomnia: Will target insomnia with Clozaril--- sleeping well  History of abusing opiates, Flexeril, Lyrica and mirtazapine. We'll benefit from continuing outpatient substance abuse services.   For diabetes: Continue Lantus 42 units daily at bedtime. Continue NovoLog but will decrease to 5 units 3 times a day with meals. Continue metformin 1000 mg twice a day. Will place orders for supplemental insulin. Had an episode of hypoglycemia last night. Appears that he skipped dinner  Hypertension: Continue lisinopril 20 mg a day and propanolol 20 mg twice a day.  BP and HR wnl  Dyslipidemia continue Zocor 40 mg at 1700  Asthma continue Singulair 10 mg q daily  Neck pain: Continue Mobic 7.5 mg  bid. Continue lidocaine patch to neck. Multiple visits to the ER complaining of neck pain.  PT pending  Constipation:continue miralax daily   Tobacco use disorder: continue nicotine patch 21 mg a day  Labs: hemoglobin A1c, lipid panel, TSH all completed---we had all of these from Feb 2018  Diet carb modified and low sodium  Disposition:  back to his group home once stable   Follow up: continue to f/u with [facility] in [place]. Continue day program.  Will refer for case coordination   EKG completed  Consults: consulted DM coordinator, dietician and PT   Kristine Linea, MD 10/17/2016, 2:52 PM

## 2016-10-17 NOTE — Plan of Care (Signed)
Problem: Coping: Goal: Ability to verbalize feelings will improve Outcome: Progressing Patient verbalized some feelings towards staff.

## 2016-10-17 NOTE — Progress Notes (Signed)
D: Pt denies SI/HI/AVH, but noted responding to internal stimuli, patient on 1:1 no distress noted, patient is in the dayroom with other patient, staring inappropriately at some patients and sometimes intrusive. Patient was frequently redirected, will continue to closely monitor.

## 2016-10-17 NOTE — Progress Notes (Signed)
Pt. Is isolative to room for much of shift, but does attend groups and go outside.  Attended all meals.  Calm and cooperative, med-compliant.  Speech is slurred, difficult to understand.  Pt. Made statements about a video game and genesis.  Unsure if this is disorganized/illogical thoughts are if slurred speech is causing communication barriers.  Appears slightly annoyed with nurse asks for clarification on his statements.  Otherwise, very pleasant.  Remains disheveled and malodorous.  Nurse encouraged pt. To shower, pt. Reported that he had already.  Med-compliant.  Denies SI/HI/AVH, although responses are vague.  No behavioral issues to report.

## 2016-10-17 NOTE — Plan of Care (Signed)
Problem: Nutrition: Goal: Adequate nutrition will be maintained Outcome: Progressing Adequate food intake

## 2016-10-17 NOTE — [facility] Group Notes (Signed)
[facility] LCSW Group Therapy  10/17/2016 2:52 PM  Type of Therapy:  Group Therapy  Participation Level:  Patient did not attend group. CSW invited patient to group.   Summary of Progress/Problems: Coping Skills: Patients defined and discussed healthy coping skills. Patients identified healthy coping skills they would like to try during hospitalization and after discharge. CSW offered insight to varying coping skills that may have been new to patients such as practicing mindfulness.  Tony Cunningham G. Tony Cunningham MSW, LCSWA 10/17/2016, 2:52 PM

## 2016-10-17 NOTE — [facility] Group Notes (Signed)
[facility] Group Notes:  (Nursing/MHT/Case Management/Adjunct)  Date:  10/17/2016  Time:  11:59 PM  Type of Therapy:  Group Therapy  Participation Level:  Did Not Attend  Participation Quality Summary of Progress/Problems:  Tony Cunningham 10/17/2016, 11:59 PM

## 2016-10-17 NOTE — [facility] Group Notes (Signed)
[facility] Group Notes:  (Nursing/MHT/Case Management/Adjunct)  Date:  10/17/2016  Time:  5:08 AM  Type of Therapy:  Psychoeducational Skills  Participation Level:  Did Not Attend  Summary of Progress/Problems:  Chancy Milroy 10/17/2016, 5:08 AM

## 2016-10-18 LAB — GLUCOSE, CAPILLARY
GLUCOSE-CAPILLARY: 232 mg/dL — AB (ref 65–99)
Glucose-Capillary: 126 mg/dL — ABNORMAL HIGH (ref 65–99)
Glucose-Capillary: 56 mg/dL — ABNORMAL LOW (ref 65–99)
Glucose-Capillary: 215 mg/dL — ABNORMAL HIGH (ref 65–99)
Glucose-Capillary: 215 mg/dL — ABNORMAL HIGH (ref 65–99)

## 2016-10-18 NOTE — Plan of Care (Signed)
Problem: Coping: Goal: Ability to verbalize feelings will improve Outcome: Progressing Patient demonstrates more awareness of unit routine and is compliant with diabetes management and medication administration.  He has improved eye contact and is more assertive in interaction with staff.  As he has improved concentration, he is less tolerant of "having to wait" and has improved assertive communication.

## 2016-10-18 NOTE — Plan of Care (Signed)
Problem: Safety: Goal: Ability to disclose and discuss suicidal ideas will improve Outcome: Not Progressing Forwards little.  Answers yes and not questions.  Isolates to room except for meals and medication pass.

## 2016-10-18 NOTE — [facility] Group Notes (Signed)
[facility] LCSW Group Therapy  10/18/2016 2:40 PM  Type of Therapy:  Group Therapy  Participation Level:  Did Not Attend  Summary of Progress/Problems: Communications: Patients identify how individuals communicate with one another appropriately and inappropriately. Patients will be guided to discuss their thoughts, feelings, and behaviors related to barriers when communicating. The group will process together ways to execute positive and appropriate communications.   Rianna Lukes G. Garnette Czech MSW, LCSWA 10/18/2016, 2:40 PM

## 2016-10-18 NOTE — Plan of Care (Signed)
Problem: Safety: Goal: Ability to disclose and discuss suicidal ideas will improve Outcome: Not Progressing Patient was observed in bed sleeping at the beginning of this shift.  He was found to be in a wet bed, soaked with urine.  He was difficult to arouse.  BG was 215 at that time.  He was escorted to the shower by two staff and required constant verbal commands to cooperate with showering, drying, and allowing staff to dress him in scrubs.  He was taken to the dayroom via wheelchair and directed to eat snack while being observed for total intake.  He consumed a granola bar and a carton of milk. He left the dayroom and returned to bed.  He was transferred to a room closer to the nurses station for observation.

## 2016-10-18 NOTE — Progress Notes (Signed)
Hypoglycemic Event  CBG: 56  Treatment: 15 GM carbohydrate snack  Symptoms: Sweaty and lethargic  Follow-up CBG: Time:1155 CBG Result: 126  Possible Reasons for Event: Unknown  Comments/MD notified:Dr. Mliss Fritz, Abigail Miyamoto

## 2016-10-18 NOTE — Progress Notes (Signed)
[facility] MD Progress Note  10/18/2016 1:20 PM Tony Cunningham  MRN:  [id number]  Subjective:   41 year old single male with history of schizoaffective disorder depressed type patient came voluntarily to our emergency department in the company of mobile crisis unit. He was complaining of hearing voices which were commanding him to harm himself. Patient reported that he was thinking about killing himself by running into traffic or drowning himself.  10/10/16:The patient has been isolative to his room this morning and had minimal interaction with staff and peers. He did eventually come out of his room for a little while but did not interact with peers. He was feeling very lethargic and tired this morning. He denies any current active or passive suicidal thoughts or psychotic symptoms. He says he does not want to go back to the group home because the other residents there are mean to him. He did not answer all questions asked that she was falling asleep and lethargic during the visit. He has been compliant with psychotropic medications. He slept over 8 hours last night. Blood pressure was slightly elevated this morning. Blood sugars have become more controlled compared to the time of admission.  10/11/16:  The patient reports that his anxiety has decreased with the Clozaril and that is the only positive changes noticed with the medication. He is not as sedated or tired this morning compared to yesterday. He did get up for breakfast but otherwise has been isolative to his room. He did not go outside with the other patients and has not been attending groups. The patient gave very refer responses to questions asked. He denies any current active or passive suicidal thoughts or hallucinations. He denied paranoid thoughts but did express paranoid thoughts about the group home and people being upset with him for calling the ambulance. He denies any somatic complaints. Blood sugars have been elevated. Per nursing, he slept  7.45 hours last night but has been sleeping intermittently throughout the daytime. Vital signs are stable.  5/21 patient has been withdrawn to his room. Minimal participation in programming. Tells me he is not suicidal, homicidal and does not have hallucinations. He reports feeling well denies problems with appetite, energy or concentration. Says that  he is not sleeping well throughout the night. Nurses told me he has been is sleeping all throughout the day. Patient denies lightheadedness, constipation, drooling or dizziness.  5/22 withdrawn to his room. Sleeping during the daytime. Not going to groups. Poor hygiene and grooming. He denies having any lightheadedness, dizziness, constipation or chest pain. He does complain of drooling. He denies suicidality, homicidality or hallucinations. He says that he is having thoughts about pushing his mother but he will never do that because she is his angel. Patient makes some statements that appeared to be unrelated to the questions. This is not sleeping well Tony Cunningham is sleeping a lot throughout the day). He does complain of some mild sedation. During assessment he did not appear sedated  5/23 without change. He is not participating in any groups. He is hygiene and grooming are poor. She stays in bed all day on him in his room for meals. He denies to me having any dizziness, lightheadedness, chest pain, constipation. He does complain of feeling a little bit tired. Denies suicidality, homicidality or auditory or visual hallucinations.   5/24 pairs staff the patient was overly sedated yesterday. To the point that he was falling asleep while eating. He has been withdrawn and stays in his room most of the time.  I will decrease the Clozaril dose to 75 mg tonight. Clozaril level is currently pending. Patient says that he feels a little bit better than before. He does complain of feeling tired. Other than that he denies any lightheadedness, dizziness, chest pain,  constipation or sialorrhea. He denies SI, HI or auditory or visual hallucinations. He does tell me he continues to have thoughts that he would rather go to sleep and not wake up  5/25 Tony Cunningham was able to say that he was better today. He seems less sedated than before. He also has better grooming and hygiene today. He denies having suicidality or homicidality. He denies hallucinations. He denies side effects from medications. He denies any physical complaints. He has been cooperative with medications. Still withdrawn to not attending groups.  10/17/2016. Tony Cunningham has no complaints except for the tremor. Sleep and appetite are good. He no longer uses a wheelchair. Tolerates medications wee. He is mostly in his room and not engaging, not even going ouside when the weather is so pleasant. He is no longer 1:1. There are no behavioral problems.  10/18/2016. Tony Cunningham is hardly leaving his room. He has no complaints. There are no somatic complaints. He does not participate. He accepts medications as prescribed. He did not have breakfast today and blood sugar was low at lunch. He recovered after lunch which he ate in full.    Per nursing: Problem: Coping: Goal: Ability to verbalize feelings will improve Outcome: Progressing Patient demonstrates more awareness of unit routine and is compliant with diabetes management and medication administration.  He has improved eye contact and is more assertive in interaction with staff.  As he has improved concentration, he is less tolerant of "having to wait" and has improved assertive communication.   Principal Problem: Schizoaffective disorder, depressive type (HCC) Diagnosis:   Patient Active Problem List   Diagnosis Date Noted  . Schizoaffective disorder, depressive type (HCC) [F25.1] 09/23/2016  . Polysubstance abuse [F19.10] 07/17/2016  . Tardive dyskinesia [G24.01] 07/16/2016  . Tobacco use disorder [F17.200] 07/07/2016  . Dyslipidemia [E78.5] 07/07/2016  .  Asthma [J45.909] 07/07/2016  . HTN (hypertension) [I10] 07/06/2016  . Diabetes (HCC) [E11.9] 12/25/2010   Total Time spent with patient: 30 minutes  Past Psychiatric History: Patient has a long history of mental illness and has had multiple hospitalizations including multiple hospitalizations in [place]. Most of those records are unavailable to me. 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 Not sure whether he ever returns to an adequate baseline. He tells me that he has tried to cut himself and kill himself in the past. Denies any history of violence. He can't remember the names of any of his medicines. Outpatient medicines listed on admission included Abilify and Effexor. Also hospitalized at [facility], [place].  Past Medical History:  Past Medical History:  Diagnosis Date  . Anxiety   . Asthma   . Diabetes mellitus   . High blood pressure   . Sinus complaint    History reviewed. No pertinent surgical history.   Family History: History reviewed. No pertinent family history.   Family Psychiatric  History: None known   Social History: Living in a group home. Has been there about 4-6 months he estimates. Says that he does not like it. 11 grade education. Does not have a legal guardian  History  Alcohol Use No     History  Drug Use No    Social History  Social History  . Marital status: Single    Spouse name: [name]  . Number of children: [name]  . Years of education: [name]   Social History Main Topics  . Smoking status: Current Every Day Smoker    Packs/day: 0.50    Types: Cigarettes  . Smokeless tobacco: Never Used  . Alcohol use No  . Drug use: No  . Sexual activity: Not Currently   Other Topics Concern  . None   Social History Narrative  . None     Current Medications: Current Facility-Administered Medications  Medication Dose Route Frequency Provider Last Rate Last Dose  . acetaminophen (TYLENOL)  tablet 650 mg  650 mg Oral Q6H PRN [name], [name], MD   650 mg at 10/13/16 0156  . alum & mag hydroxide-simeth (MAALOX/MYLANTA) 200-200-20 MG/5ML suspension 30 mL  30 mL Oral Q4H PRN [name], [name], MD      . cloZAPine (CLOZARIL) tablet 100 mg  100 mg Oral QHS [name], MD   100 mg at 10/17/16 1648  . dextrose (GLUTOSE) 40 % oral gel 37.5 g  1 Tube Oral Once PRN [name], MD      . fluvoxaMINE (LUVOX) tablet 50 mg  50 mg Oral BID [name], MD   50 mg at 10/18/16 0813  . insulin aspart (novoLOG) injection 0-15 Units  0-15 Units Subcutaneous TID WC [name], MD   5 Units at 10/18/16 0813  . insulin aspart (novoLOG) injection 0-5 Units  0-5 Units Subcutaneous QHS [name], MD   4 Units at 10/17/16 2046  . insulin aspart (novoLOG) injection 5 Units  5 Units Subcutaneous TID WC [name], MD   5 Units at 10/18/16 [phone]  . insulin glargine (LANTUS) injection 42 Units  42 Units Subcutaneous QHS [name], MD   42 Units at 10/17/16 2045  . ipratropium (ATROVENT) 0.06 % nasal spray 1 spray  1 spray Each Nare QPC supper [name], MD   1 spray at 10/16/16 1643  . lidocaine (LIDODERM) 5 % 1 patch  1 patch Transdermal Q24H [name], MD   1 patch at 10/18/16 1228  . lisinopril (PRINIVIL,ZESTRIL) tablet 20 mg  20 mg Oral Daily [name], [name], MD   20 mg at 10/17/16 0926  . magnesium hydroxide (MILK OF MAGNESIA) suspension 30 mL  30 mL Oral Daily PRN [name], [name], MD      . meloxicam (MOBIC) tablet 7.5 mg  7.5 mg Oral BID [name], MD   7.5 mg at 10/18/16 0813  . metFORMIN (GLUCOPHAGE) tablet 1,000 mg  1,000 mg Oral BID WC [name], [name], MD   1,000 mg at 10/18/16 0813  . montelukast (SINGULAIR) tablet 10 mg  10 mg Oral Daily [name], MD   10 mg at 10/18/16 0813  . nicotine (NICODERM CQ - dosed in mg/24 hours) patch  21 mg  21 mg Transdermal Daily [name], MD   21 mg at 10/18/16 0813  . polyethylene glycol (MIRALAX / GLYCOLAX) packet 17 g  17 g Oral Daily [name], MD   17 g at 10/18/16 2563  . propranolol (INDERAL) tablet 20 mg  20 mg Oral BID [name], MD   20 mg at 10/17/16 1648  . simvastatin (ZOCOR) tablet 40 mg  40 mg Oral QHS [name], MD   40 mg at 10/17/16 1648    Lab Results:  Results for orders placed or performed during the hospital encounter of 10/08/16 (from  the past 48 hour(s))  Glucose, capillary     Status: Abnormal   Collection Time: 10/16/16  4:38 PM  Result Value Ref Range   Glucose-Capillary 275 (H) 65 - 99 mg/dL  Glucose, capillary     Status: Abnormal   Collection Time: 10/16/16  8:53 PM  Result Value Ref Range   Glucose-Capillary 360 (H) 65 - 99 mg/dL  Glucose, capillary     Status: Abnormal   Collection Time: 10/17/16  6:45 AM  Result Value Ref Range   Glucose-Capillary 259 (H) 65 - 99 mg/dL   Comment 1 Document in Chart   Glucose, capillary     Status: Abnormal   Collection Time: 10/17/16 11:45 AM  Result Value Ref Range   Glucose-Capillary 304 (H) 65 - 99 mg/dL  Glucose, capillary     Status: Abnormal   Collection Time: 10/17/16  4:45 PM  Result Value Ref Range   Glucose-Capillary 214 (H) 65 - 99 mg/dL  Glucose, capillary     Status: Abnormal   Collection Time: 10/17/16  8:32 PM  Result Value Ref Range   Glucose-Capillary 338 (H) 65 - 99 mg/dL   Comment 1 Notify RN    Comment 2 Document in Chart   Glucose, capillary     Status: Abnormal   Collection Time: 10/18/16  6:28 AM  Result Value Ref Range   Glucose-Capillary 232 (H) 65 - 99 mg/dL   Comment 1 Notify RN    Comment 2 Document in Chart   Glucose, capillary     Status: Abnormal   Collection Time: 10/18/16 11:37 AM  Result Value Ref Range   Glucose-Capillary 56 (L) 65 - 99 mg/dL  Glucose, capillary     Status: Abnormal   Collection  Time: 10/18/16 12:02 PM  Result Value Ref Range   Glucose-Capillary 126 (H) 65 - 99 mg/dL    Blood Alcohol level:  Lab Results  Component Value Date   ETH <5 10/07/2016   ETH <5 09/23/2016    Metabolic Disorder Labs: Lab Results  Component Value Date   HGBA1C 11.2 (H) 10/09/2016   MPG 275 10/09/2016   MPG 275 09/24/2016   Lab Results  Component Value Date   PROLACTIN 24.5 (H) 09/24/2016   PROLACTIN 3.4 (L) 07/07/2016   Lab Results  Component Value Date   CHOL 127 10/09/2016   TRIG 80 10/09/2016   HDL 39 (L) 10/09/2016   CHOLHDL 3.3 10/09/2016   VLDL 16 10/09/2016   LDLCALC 72 10/09/2016   LDLCALC 56 07/07/2016     Musculoskeletal: Strength & Muscle Tone: within normal limits Gait & Station: normal Patient leans: [name]  Psychiatric Specialty Exam: Physical Exam  Nursing note and vitals reviewed. Constitutional: He is oriented to person, place, and time. He appears well-developed and well-nourished.  HENT:  Head: Normocephalic and atraumatic.  Eyes: Conjunctivae and EOM are normal.  Neck: Normal range of motion.  Respiratory: Effort normal.  Neurological: He is alert and oriented to person, place, and time. He displays no tremor.  Psychiatric: His speech is normal. Thought content normal. His affect is blunt. He is slowed and withdrawn. Cognition and memory are normal. He expresses impulsivity.    Review of Systems  Constitutional: Negative.  Negative for chills, fever and weight loss.  HENT: Negative.  Negative for congestion, hearing loss and sore throat.   Eyes: Negative.  Negative for blurred vision and double vision.  Respiratory: Negative.  Negative for cough, hemoptysis, sputum production and shortness of breath.  Cardiovascular: Negative.  Negative for chest pain and palpitations.  Gastrointestinal: Negative for abdominal pain, constipation, diarrhea, heartburn, nausea and vomiting.  Genitourinary: Negative.  Negative for dysuria, frequency and  urgency.  Musculoskeletal: Positive for neck pain. Negative for back pain, joint pain and myalgias.  Skin: Negative.  Negative for rash.  Neurological: Positive for tremors. Negative for dizziness, tingling, sensory change, speech change and headaches.  Endo/Heme/Allergies: Negative.  Negative for environmental allergies. Does not bruise/bleed easily.  Psychiatric/Behavioral: Positive for depression. Negative for hallucinations, memory loss, substance abuse and suicidal ideas. The patient is nervous/anxious. The patient does not have insomnia.     Blood pressure 106/72, pulse 85, temperature 98.5 F (36.9 C), temperature source Oral, resp. rate 18, height 5\' 9"  (1.753 m), weight 73.9 kg (163 lb), SpO2 98 %.Body mass index is 24.07 kg/m.  General Appearance: Disheveled  Eye Contact:  Good  Speech:  Slow  Volume:  Decreased  Mood:  Dysphoric  Affect:  Constricted  Thought Process:  Linear and Descriptions of Associations: Intact Concrete  Orientation:  NA  Thought Content:  Logical  Suicidal Thoughts:  No  Homicidal Thoughts:  No  Memory:  Immediate;   Fair Recent;   Poor Remote;   Poor  Judgement:  Poor  Insight:  Lacking  Psychomotor Activity:  Normal  Concentration:  Concentration: Fair and Attention Span: Fair  Recall:  Poor  Fund of Knowledge:  Poor  Language:  Fair  Akathisia:  No  Handed:    AIMS (if indicated):     Assets:    ADL's:  Intact  Cognition:  Impaired,  Mild  Sleep:  Number of Hours: 5     Treatment Plan Summary: 41 y/o male with depression, SI and hallucinations. Multiple prior hospitalization and prior suicidal attempts. Just d/c last week.  Per review of records patient has history of abusing opiates, Lyrica, Remeron and Flexeril. Here he has been frequently asking for Ativan or clonazepam.  Schizoaffective d/o depressed type: multiple failures to antipsychotics. Pt frequently hospitalized.   -Currently on a trial of Clozaril. Level on 5/23 was  212 (low) but too sedated with 150 mg qhs.  Will change clozaril to 100 mg at 1700  -For clozaril augmentation he is on Luvox 50 mg bid  Sialorrhea: Continue atrovent sublingual at bedtime  Akathisia/TD: Amantadine was discontinued for now. Clozaril should improve tardive dyskinesia  Tremors: continue propranolol 20 mg twice a day  Insomnia: Will target insomnia with Clozaril--- sleeping well  History of abusing opiates, Flexeril, Lyrica and mirtazapine. We'll benefit from continuing outpatient substance abuse services.   For diabetes: Continue Lantus 42 units daily at bedtime. Continue NovoLog but will decrease to 5 units 3 times a day with meals. Continue metformin 1000 mg twice a day. Will place orders for supplemental insulin. Had an episode of hypoglycemia last night. Appears that he skipped dinner  Hypertension: Continue lisinopril 20 mg a day and propanolol 20 mg twice a day.  BP and HR wnl  Dyslipidemia continue Zocor 40 mg at 1700  Asthma continue Singulair 10 mg q daily  Neck pain: Continue Mobic 7.5 mg bid. Continue lidocaine patch to neck. Multiple visits to the ER complaining of neck pain.  PT pending  Constipation:continue miralax daily   Tobacco use disorder: continue nicotine patch 21 mg a day  Labs: hemoglobin A1c, lipid panel, TSH all completed---we had all of these from Feb 2018  Diet carb modified and low sodium  Disposition:  back to his  group home once stable   Follow up: continue to f/u with [facility] in [place]. Continue day program.  Will refer for case coordination   EKG completed  Consults: consulted DM coordinator, dietician and PT   Kristine Linea, MD 10/18/2016, 1:20 PM

## 2016-10-19 LAB — GLUCOSE, CAPILLARY
Glucose-Capillary: 292 mg/dL — ABNORMAL HIGH (ref 65–99)
Glucose-Capillary: 90 mg/dL (ref 65–99)
Glucose-Capillary: 320 mg/dL — ABNORMAL HIGH (ref 65–99)
Glucose-Capillary: 439 mg/dL — ABNORMAL HIGH (ref 65–99)

## 2016-10-19 MED ORDER — FLUVOXAMINE MALEATE 50 MG PO TABS
50.0000 mg | ORAL_TABLET | Freq: Every day | ORAL | Status: DC
  Administered 2016-10-19 – 2016-10-20 (×2): 50 mg via ORAL
  Filled 2016-10-19 (×2): qty 1

## 2016-10-19 MED ORDER — CLOZAPINE 100 MG PO TABS
100.0000 mg | ORAL_TABLET | Freq: Every day | ORAL | Status: DC
  Administered 2016-10-20: 100 mg via ORAL
  Filled 2016-10-19: qty 1

## 2016-10-19 NOTE — Progress Notes (Signed)
[facility] MD Progress Note  10/19/2016 7:43 AM [employer]  MRN:  [id number]  Subjective:   41 year old single male with history of schizoaffective disorder depressed type patient came voluntarily to our emergency department in the company of mobile crisis unit. He was complaining of hearing voices which were commanding him to harm himself. Patient reported that he was thinking about killing himself by running into traffic or drowning himself.  10/10/16:The patient has been isolative to his room this morning and had minimal interaction with staff and peers. He did eventually come out of his room for a little while but did not interact with peers. He was feeling very lethargic and tired this morning. He denies any current active or passive suicidal thoughts or psychotic symptoms. He says he does not want to go back to the group home because the other residents there are mean to him. He did not answer all questions asked that she was falling asleep and lethargic during the visit. He has been compliant with psychotropic medications. He slept over 8 hours last night. Blood pressure was slightly elevated this morning. Blood sugars have become more controlled compared to the time of admission.  10/11/16:  The patient reports that his anxiety has decreased with the Clozaril and that is the only positive changes noticed with the medication. He is not as sedated or tired this morning compared to yesterday. He did get up for breakfast but otherwise has been isolative to his room. He did not go outside with the other patients and has not been attending groups. The patient gave very refer responses to questions asked. He denies any current active or passive suicidal thoughts or hallucinations. He denied paranoid thoughts but did express paranoid thoughts about the group home and people being upset with him for calling the ambulance. He denies any somatic complaints. Blood sugars have been elevated. Per nursing, he slept  7.45 hours last night but has been sleeping intermittently throughout the daytime. Vital signs are stable.  5/21 patient has been withdrawn to his room. Minimal participation in programming. Tells me he is not suicidal, homicidal and does not have hallucinations. He reports feeling well denies problems with appetite, energy or concentration. Says that  he is not sleeping well throughout the night. Nurses told me he has been is sleeping all throughout the day. Patient denies lightheadedness, constipation, drooling or dizziness.  5/22 withdrawn to his room. Sleeping during the daytime. Not going to groups. Poor hygiene and grooming. He denies having any lightheadedness, dizziness, constipation or chest pain. He does complain of drooling. He denies suicidality, homicidality or hallucinations. He says that he is having thoughts about pushing his mother but he will never do that because she is his angel. Patient makes some statements that appeared to be unrelated to the questions. This is not sleeping well Dimas Aguas is sleeping a lot throughout the day). He does complain of some mild sedation. During assessment he did not appear sedated  5/23 without change. He is not participating in any groups. He is hygiene and grooming are poor. She stays in bed all day on him in his room for meals. He denies to me having any dizziness, lightheadedness, chest pain, constipation. He does complain of feeling a little bit tired. Denies suicidality, homicidality or auditory or visual hallucinations.   5/24 pairs staff the patient was overly sedated yesterday. To the point that he was falling asleep while eating. He has been withdrawn and stays in his room most of the time.  I will decrease the Clozaril dose to 75 mg tonight. Clozaril level is currently pending. Patient says that he feels a little bit better than before. He does complain of feeling tired. Other than that he denies any lightheadedness, dizziness, chest pain,  constipation or sialorrhea. He denies SI, HI or auditory or visual hallucinations. He does tell me he continues to have thoughts that he would rather go to sleep and not wake up  5/25 Mr. Girvan was able to say that he was better today. He seems less sedated than before. He also has better grooming and hygiene today. He denies having suicidality or homicidality. He denies hallucinations. He denies side effects from medications. He denies any physical complaints. He has been cooperative with medications. Still withdrawn to not attending groups.  10/17/2016. Mr. Sames has no complaints except for the tremor. Sleep and appetite are good. He no longer uses a wheelchair. Tolerates medications wee. He is mostly in his room and not engaging, not even going ouside when the weather is so pleasant. He is no longer 1:1. There are no behavioral problems.  5/27Mr. Galli is hardly leaving his room. He has no complaints. There are no somatic complaints. He does not participate. He accepts medications as prescribed. He did not have breakfast today and blood sugar was low at lunch. He recovered after lunch which he ate in full.    5/28 in bed, not participating in any groups. He denies side effects. He does not appear sedated when I go to see him but he is in his room lying in bed, he is not sleeping his awake. He denies any dizziness, lightheadedness, chest pain. He denies constipation. There is evidence of drooling on the pillow this morning. And apparently had an episode of enuresis yesterday. He denies hallucinations. Denies suicidality or homicidality. Patient as usual is a poor historian  Per nursing: Patient was observed in bed sleeping at the beginning of this shift.  He was found to be in a wet bed, soaked with urine.  He was difficult to arouse.  BG was 215 at that time.  He was escorted to the shower by two staff and required constant verbal commands to cooperate with showering, drying, and allowing staff to dress  him in scrubs.  He was taken to the dayroom via wheelchair and directed to eat snack while being observed for total intake.  He consumed a granola bar and a carton of milk. He left the dayroom and returned to bed.  He was transferred to a room closer to the nurses station for observation.   Principal Problem: Schizoaffective disorder, depressive type (HCC) Diagnosis:   Patient Active Problem List   Diagnosis Date Noted  . Schizoaffective disorder, depressive type (HCC) [F25.1] 09/23/2016  . Polysubstance abuse [F19.10] 07/17/2016  . Tardive dyskinesia [G24.01] 07/16/2016  . Tobacco use disorder [F17.200] 07/07/2016  . Dyslipidemia [E78.5] 07/07/2016  . Asthma [J45.909] 07/07/2016  . HTN (hypertension) [I10] 07/06/2016  . Diabetes (HCC) [E11.9] 12/25/2010   Total Time spent with patient: 30 minutes  Past Psychiatric History: Patient has a long history of mental illness and has had multiple hospitalizations including multiple hospitalizations in [place]. Most of those records are unavailable to me. 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 Not sure whether he ever returns to an adequate baseline. He tells me that he has tried to cut himself and kill himself in the past. Denies any history of violence. He  can't remember the names of any of his medicines. Outpatient medicines listed on admission included Abilify and Effexor. Also hospitalized at [facility], [place].  Past Medical History:  Past Medical History:  Diagnosis Date  . Anxiety   . Asthma   . Diabetes mellitus   . High blood pressure   . Sinus complaint    History reviewed. No pertinent surgical history.   Family History: History reviewed. No pertinent family history.   Family Psychiatric  History: None known   Social History: Living in a group home. Has been there about 4-6 months he estimates. Says that he does not like it. 11 grade education. Does not have a  legal guardian  History  Alcohol Use No     History  Drug Use No    Social History   Social History  . Marital status: Single    Spouse name: [name]  . Number of children: [name]  . Years of education: [name]   Social History Main Topics  . Smoking status: Current Every Day Smoker    Packs/day: 0.50    Types: Cigarettes  . Smokeless tobacco: Never Used  . Alcohol use No  . Drug use: No  . Sexual activity: Not Currently   Other Topics Concern  . None   Social History Narrative  . None     Current Medications: Current Facility-Administered Medications  Medication Dose Route Frequency Provider Last Rate Last Dose  . acetaminophen (TYLENOL) tablet 650 mg  650 mg Oral Q6H PRN [name], [name], MD   650 mg at 10/13/16 0156  . alum & mag hydroxide-simeth (MAALOX/MYLANTA) 200-200-20 MG/5ML suspension 30 mL  30 mL Oral Q4H PRN [name], [name], MD      . Melene Muller ON 10/20/2016] cloZAPine (CLOZARIL) tablet 100 mg  100 mg Oral QHS Hernandez-Gonzalez, Sue Lush, MD      . dextrose (GLUTOSE) 40 % oral gel 37.5 g  1 Tube Oral Once PRN [name], MD      . fluvoxaMINE (LUVOX) tablet 50 mg  50 mg Oral Daily Hernandez-Gonzalez, Sue Lush, MD      . insulin aspart (novoLOG) injection 0-15 Units  0-15 Units Subcutaneous TID WC [name], MD   5 Units at 10/18/16 1650  . insulin aspart (novoLOG) injection 0-5 Units  0-5 Units Subcutaneous QHS [name], MD   2 Units at 10/18/16 2202  . insulin aspart (novoLOG) injection 5 Units  5 Units Subcutaneous TID WC [name], MD   5 Units at 10/18/16 1655  . insulin glargine (LANTUS) injection 42 Units  42 Units Subcutaneous QHS [name], MD   42 Units at 10/18/16 2201  . ipratropium (ATROVENT) 0.06 % nasal spray 1 spray  1 spray Each Nare QPC supper [name], MD   1 spray at 10/16/16 1643  . lidocaine (LIDODERM) 5 % 1 patch  1 patch Transdermal Q24H  [name], MD   1 patch at 10/18/16 1228  . lisinopril (PRINIVIL,ZESTRIL) tablet 20 mg  20 mg Oral Daily [name], [name], MD   20 mg at 10/17/16 0926  . magnesium hydroxide (MILK OF MAGNESIA) suspension 30 mL  30 mL Oral Daily PRN [name], [name], MD      . meloxicam (MOBIC) tablet 7.5 mg  7.5 mg Oral BID [name], MD   7.5 mg at 10/18/16 1650  . metFORMIN (GLUCOPHAGE) tablet 1,000 mg  1,000 mg Oral BID WC [name], [name], MD   1,000 mg at 10/18/16 1650  .  montelukast (SINGULAIR) tablet 10 mg  10 mg Oral Daily [name], MD   10 mg at 10/18/16 0813  . nicotine (NICODERM CQ - dosed in mg/24 hours) patch 21 mg  21 mg Transdermal Daily [name], MD   21 mg at 10/18/16 0813  . polyethylene glycol (MIRALAX / GLYCOLAX) packet 17 g  17 g Oral Daily [name], MD   17 g at 10/18/16 1610  . propranolol (INDERAL) tablet 20 mg  20 mg Oral BID [name], MD   20 mg at 10/18/16 1650  . simvastatin (ZOCOR) tablet 40 mg  40 mg Oral QHS [name], MD   40 mg at 10/18/16 1650    Lab Results:  Results for orders placed or performed during the hospital encounter of 10/08/16 (from the past 48 hour(s))  Glucose, capillary     Status: Abnormal   Collection Time: 10/17/16 11:45 AM  Result Value Ref Range   Glucose-Capillary 304 (H) 65 - 99 mg/dL  Glucose, capillary     Status: Abnormal   Collection Time: 10/17/16  4:45 PM  Result Value Ref Range   Glucose-Capillary 214 (H) 65 - 99 mg/dL  Glucose, capillary     Status: Abnormal   Collection Time: 10/17/16  8:32 PM  Result Value Ref Range   Glucose-Capillary 338 (H) 65 - 99 mg/dL   Comment 1 Notify RN    Comment 2 Document in Chart   Glucose, capillary     Status: Abnormal   Collection Time: 10/18/16  6:28 AM  Result Value Ref Range   Glucose-Capillary 232 (H) 65 - 99 mg/dL   Comment 1 Notify RN    Comment 2 Document in Chart   Glucose,  capillary     Status: Abnormal   Collection Time: 10/18/16 11:37 AM  Result Value Ref Range   Glucose-Capillary 56 (L) 65 - 99 mg/dL  Glucose, capillary     Status: Abnormal   Collection Time: 10/18/16 12:02 PM  Result Value Ref Range   Glucose-Capillary 126 (H) 65 - 99 mg/dL  Glucose, capillary     Status: Abnormal   Collection Time: 10/18/16  4:44 PM  Result Value Ref Range   Glucose-Capillary 215 (H) 65 - 99 mg/dL   Comment 1 Document in Chart   Glucose, capillary     Status: Abnormal   Collection Time: 10/18/16  8:10 PM  Result Value Ref Range   Glucose-Capillary 215 (H) 65 - 99 mg/dL  Glucose, capillary     Status: Abnormal   Collection Time: 10/19/16  6:30 AM  Result Value Ref Range   Glucose-Capillary 292 (H) 65 - 99 mg/dL    Blood Alcohol level:  Lab Results  Component Value Date   ETH <5 10/07/2016   ETH <5 09/23/2016    Metabolic Disorder Labs: Lab Results  Component Value Date   HGBA1C 11.2 (H) 10/09/2016   MPG 275 10/09/2016   MPG 275 09/24/2016   Lab Results  Component Value Date   PROLACTIN 24.5 (H) 09/24/2016   PROLACTIN 3.4 (L) 07/07/2016   Lab Results  Component Value Date   CHOL 127 10/09/2016   TRIG 80 10/09/2016   HDL 39 (L) 10/09/2016   CHOLHDL 3.3 10/09/2016   VLDL 16 10/09/2016   LDLCALC 72 10/09/2016   LDLCALC 56 07/07/2016     Musculoskeletal: Strength & Muscle Tone: within normal limits Gait & Station: normal Patient leans: [name]  Psychiatric Specialty Exam: Physical Exam  Nursing note and vitals  reviewed. Constitutional: He is oriented to person, place, and time. He appears well-developed and well-nourished.  HENT:  Head: Normocephalic and atraumatic.  Eyes: Conjunctivae and EOM are normal.  Neck: Normal range of motion.  Respiratory: Effort normal.  Musculoskeletal: Normal range of motion.  Neurological: He is alert and oriented to person, place, and time. He displays no tremor.  Psychiatric: His speech is normal. Thought  content normal. His affect is blunt. He is slowed and withdrawn. Cognition and memory are normal. He expresses impulsivity.    Review of Systems  Constitutional: Negative.  Negative for chills, fever and weight loss.  HENT: Negative.  Negative for congestion, hearing loss and sore throat.   Eyes: Negative.  Negative for blurred vision and double vision.  Respiratory: Negative.  Negative for cough, hemoptysis, sputum production and shortness of breath.   Cardiovascular: Negative.  Negative for chest pain and palpitations.  Gastrointestinal: Negative for abdominal pain, constipation, diarrhea, heartburn, nausea and vomiting.  Genitourinary: Negative.  Negative for dysuria, frequency and urgency.  Musculoskeletal: Positive for neck pain. Negative for back pain, joint pain and myalgias.  Skin: Negative.  Negative for rash.  Neurological: Positive for tremors. Negative for dizziness, tingling, sensory change, speech change and headaches.  Endo/Heme/Allergies: Negative.  Negative for environmental allergies. Does not bruise/bleed easily.  Psychiatric/Behavioral: Positive for depression. Negative for hallucinations, memory loss, substance abuse and suicidal ideas. The patient is nervous/anxious. The patient does not have insomnia.     Blood pressure 115/79, pulse 93, temperature 98.8 F (37.1 C), temperature source Oral, resp. rate 18, height 5\' 9"  (1.753 m), weight 73.9 kg (163 lb), SpO2 98 %.Body mass index is 24.07 kg/m.  General Appearance: Disheveled  Eye Contact:  Good  Speech:  Slow  Volume:  Decreased  Mood:  Dysphoric  Affect:  Constricted  Thought Process:  Linear and Descriptions of Associations: Intact Concrete  Orientation:  NA  Thought Content:  Logical  Suicidal Thoughts:  No  Homicidal Thoughts:  No  Memory:  Immediate;   Fair Recent;   Poor Remote;   Poor  Judgement:  Poor  Insight:  Lacking  Psychomotor Activity:  Normal  Concentration:  Concentration: Fair and  Attention Span: Fair  Recall:  Poor  Fund of Knowledge:  Poor  Language:  Fair  Akathisia:  No  Handed:    AIMS (if indicated):     Assets:    ADL's:  Intact  Cognition:  Impaired,  Mild  Sleep:  Number of Hours: 7     Treatment Plan Summary: 41 y/o male with depression, SI and hallucinations. Multiple prior hospitalization and prior suicidal attempts. Just d/c last week.  Per review of records patient has history of abusing opiates, Lyrica, Remeron and Flexeril. Here he has been frequently asking for Ativan or clonazepam.  Schizoaffective d/o depressed type: multiple failures to antipsychotics. Pt frequently hospitalized.   -Currently on a trial of Clozaril. Level on 5/23 was 212 (low) but too sedated with 150 mg qhs.  Continue clozaril to 100 mg at 1700--(will hold today due to sedation)  -For clozaril augmentation he is on Luvox but pt more sedated when luvox increased to bid.  Will decrease to 50 mg q day  Sialorrhea: Continue atrovent sublingual at bedtime  Akathisia/TD: Amantadine was discontinued for now. Clozaril should improve tardive dyskinesia  Tremors: continue propranolol 20 mg twice a day  Insomnia: Will target insomnia with Clozaril--- sleeping well  History of abusing opiates, Flexeril, Lyrica and mirtazapine. We'll benefit  from continuing outpatient substance abuse services.   For diabetes: Continue Lantus 42 units daily at bedtime. Continue NovoLog  5 units 3 times a day with meals. Continue metformin 1000 mg twice a day. Will place orders for supplemental insulin.   Hypertension: Continue lisinopril 20 mg a day and propanolol 20 mg twice a day.  BP and HR wnl  Dyslipidemia continue Zocor 40 mg at 1700  Asthma continue Singulair 10 mg q daily  Neck pain: Continue Mobic 7.5 mg bid. Continue lidocaine patch to neck. Multiple visits to the ER complaining of neck pain.    Constipation:continue miralax daily   Tobacco use disorder: continue  nicotine patch 21 mg a day  Labs: hemoglobin A1c, lipid panel, TSH all completed---we had all of these from Feb 2018  Diet carb modified and low sodium  Disposition:  back to his group home once stable   Follow up: continue to f/u with [facility] in [place]. Continue day program.  Will refer for case coordination   EKG completed  Consults: consulted DM coordinator, dietician and PT  I'm considering a possible discharge in the next 3 days. He doesn't seem to be able to tolerate more than 100 mg of Clozaril with 50 mg of Luvox.  [name], MD 10/19/2016, 7:43 AM

## 2016-10-19 NOTE — Progress Notes (Signed)
Patient stayed in bed all day.Unwillingness to participate in activities,poor hygiene.Denies suicidal or homicidal ideations and AV hallucinations.Compliant with medications.Appetite & energy level good.Verbalized that he will take shower this evening.Support & encouragement given.

## 2016-10-20 LAB — CBC WITH DIFFERENTIAL/PLATELET
BASOS ABS: 0.1 10*3/uL (ref 0–0.1)
Basophils Relative: 1 %
EOS ABS: 0.3 10*3/uL (ref 0–0.7)
EOS PCT: 5 %
HCT: 43 % (ref 40.0–52.0)
Hemoglobin: 15.1 g/dL (ref 13.0–18.0)
LYMPHS ABS: 2.6 10*3/uL (ref 1.0–3.6)
LYMPHS PCT: 43 %
MCH: 31.6 pg (ref 26.0–34.0)
MCHC: 35.1 g/dL (ref 32.0–36.0)
MCV: 90 fL (ref 80.0–100.0)
MONO ABS: 0.4 10*3/uL (ref 0.2–1.0)
Monocytes Relative: 7 %
Neutro Abs: 2.7 10*3/uL (ref 1.4–6.5)
Neutrophils Relative %: 44 %
PLATELETS: 205 10*3/uL (ref 150–440)
RBC: 4.78 MIL/uL (ref 4.40–5.90)
RDW: 12.8 % (ref 11.5–14.5)
WBC: 6 10*3/uL (ref 3.8–10.6)

## 2016-10-20 LAB — GLUCOSE, CAPILLARY
GLUCOSE-CAPILLARY: 299 mg/dL — AB (ref 65–99)
GLUCOSE-CAPILLARY: 152 mg/dL — AB (ref 65–99)
GLUCOSE-CAPILLARY: 155 mg/dL — AB (ref 65–99)

## 2016-10-20 MED ORDER — FLUVOXAMINE MALEATE 50 MG PO TABS: 50.0000 mg | ORAL_TABLET | Freq: Every day | ORAL | 0 refills | Status: DC

## 2016-10-20 MED ORDER — CLOZAPINE 100 MG PO TABS: 100.0000 mg | ORAL_TABLET | Freq: Every day | ORAL | 0 refills | Status: DC

## 2016-10-20 MED ORDER — IPRATROPIUM BROMIDE 0.06 % NA SOLN: 1.0000 | Freq: Every day | NASAL | 0 refills | Status: DC

## 2016-10-20 MED ORDER — LIDOCAINE 5 % EX PTCH: 1.0000 | MEDICATED_PATCH | CUTANEOUS | 0 refills | Status: DC

## 2016-10-20 MED ORDER — INSULIN ASPART 100 UNIT/ML ~~LOC~~ SOLN: 5.0000 [IU] | Freq: Three times a day (TID) | SUBCUTANEOUS | 0 refills | Status: DC

## 2016-10-20 MED ORDER — PROPRANOLOL HCL 20 MG PO TABS: 20.0000 mg | ORAL_TABLET | Freq: Two times a day (BID) | ORAL | 0 refills | Status: DC

## 2016-10-20 MED ORDER — MELOXICAM 7.5 MG PO TABS: 7.5000 mg | ORAL_TABLET | Freq: Two times a day (BID) | ORAL | 0 refills | Status: DC

## 2016-10-20 MED ORDER — POLYETHYLENE GLYCOL 3350 17 G PO PACK: 17.0000 g | PACK | Freq: Every day | ORAL | 0 refills | Status: DC

## 2016-10-20 MED ORDER — INSULIN GLARGINE 100 UNIT/ML ~~LOC~~ SOLN: 42.0000 [IU] | Freq: Every day | SUBCUTANEOUS | 0 refills | Status: DC

## 2016-10-20 NOTE — NC FL2 (Signed)
[place] MEDICAID FL2 LEVEL OF CARE SCREENING TOOL     IDENTIFICATION  Patient Name: Tony Cunningham Birthdate: 17-Feb-1976 Sex: male Admission Date (Current Location): 10/08/2016  [place] and [place] Number:  [address] [id number] T Facility and Address:  [facility], [address] [place], [place] [address]      Provider Number: [id number]  Attending Physician Name and Address:  Van Clines*  Relative Name and Phone Number:  [name], mother.  [phone]    Current Level of Care: Domiciliary (Rest home) Recommended Level of Care: Family Care Home Prior Approval Number:    Date Approved/Denied:   PASRR Number:    Discharge Plan: Other (Comment)    Current Diagnoses: Patient Active Problem List   Diagnosis Date Noted  . Schizoaffective disorder, depressive type (HCC) 09/23/2016  . Polysubstance abuse 07/17/2016  . Tardive dyskinesia 07/16/2016  . Tobacco use disorder 07/07/2016  . Dyslipidemia 07/07/2016  . Asthma 07/07/2016  . HTN (hypertension) 07/06/2016  . Diabetes (HCC) 12/25/2010    Orientation RESPIRATION BLADDER Height & Weight     Self, Time, Situation, Place  Normal Continent Weight: 163 lb (73.9 kg) Height:  5\' 9"  (175.3 cm)  BEHAVIORAL SYMPTOMS/MOOD NEUROLOGICAL BOWEL NUTRITION STATUS     (na) Continent  (diabetic)  AMBULATORY STATUS COMMUNICATION OF NEEDS Skin   Independent Verbally Normal                       Personal Care Assistance Level of Assistance   (na)           Functional Limitations Info   (na)          SPECIAL CARE FACTORS FREQUENCY   (nana)                    Contractures Contractures Info: Not present    Additional Factors Info   (diabetic)               Current Medications (10/20/2016):  This is the current hospital active medication list Current Facility-Administered Medications  Medication Dose Route Frequency Provider Last Rate Last Dose  .  acetaminophen (TYLENOL) tablet 650 mg  650 mg Oral Q6H PRN [name], 10/22/2016, MD   650 mg at 10/13/16 0156  . alum & mag hydroxide-simeth (MAALOX/MYLANTA) 200-200-20 MG/5ML suspension 30 mL  30 mL Oral Q4H PRN [name], [name], MD      . cloZAPine (CLOZARIL) tablet 100 mg  100 mg Oral QHS Hernandez-Gonzalez, 10/15/16, MD      . dextrose (GLUTOSE) 40 % oral gel 37.5 g  1 Tube Oral Once PRN Sue Lush, MD      . fluvoxaMINE (LUVOX) tablet 50 mg  50 mg Oral Daily [name], MD   50 mg at 10/19/16 1653  . insulin aspart (novoLOG) injection 0-15 Units  0-15 Units Subcutaneous TID WC 07-24-1984, MD   8 Units at 10/20/16 [phone]  . insulin aspart (novoLOG) injection 0-5 Units  0-5 Units Subcutaneous QHS 5038, MD   5 Units at 10/19/16 2124  . insulin aspart (novoLOG) injection 5 Units  5 Units Subcutaneous TID WC 2125, MD   5 Units at 10/19/16 1655  . insulin glargine (LANTUS) injection 42 Units  42 Units Subcutaneous QHS 10/21/16, MD   42 Units at 10/19/16 2122  . ipratropium (ATROVENT) 0.06 % nasal spray 1 spray  1 spray Each Nare QPC supper 2123, MD   1 spray at 10/16/16  1643  . lidocaine (LIDODERM) 5 % 1 patch  1 patch Transdermal Q24H [name], MD   1 patch at 10/20/16 0855  . lisinopril (PRINIVIL,ZESTRIL) tablet 20 mg  20 mg Oral Daily [name], [name], MD   20 mg at 10/20/16 0844  . magnesium hydroxide (MILK OF MAGNESIA) suspension 30 mL  30 mL Oral Daily PRN [name], [name], MD      . meloxicam (MOBIC) tablet 7.5 mg  7.5 mg Oral BID [name], MD   7.5 mg at 10/20/16 0844  . metFORMIN (GLUCOPHAGE) tablet 1,000 mg  1,000 mg Oral BID WC [name], [name], MD   1,000 mg at 10/20/16 0843  . montelukast (SINGULAIR) tablet 10 mg  10 mg Oral Daily [name], MD   10 mg at 10/20/16 0842  . nicotine (NICODERM CQ - dosed in mg/24 hours)  patch 21 mg  21 mg Transdermal Daily [name], MD   21 mg at 10/19/16 0830  . polyethylene glycol (MIRALAX / GLYCOLAX) packet 17 g  17 g Oral Daily [name], MD   17 g at 10/20/16 0843  . propranolol (INDERAL) tablet 20 mg  20 mg Oral BID [name], MD   20 mg at 10/20/16 0843  . simvastatin (ZOCOR) tablet 40 mg  40 mg Oral QHS [name], MD   40 mg at 10/19/16 1654     Discharge Medications: Please see discharge summary for a list of discharge medications.  Relevant Imaging Results:  Relevant Lab Results:   Additional Information None  [name], LCSW

## 2016-10-20 NOTE — Progress Notes (Signed)
Patient discharged home. DC instructions provided and explained with group home representative and patient. Medications reviewed. Rx given as well as Fl2, AVS, transition, and discharge risk assessment. Denies SI, HI, AVH. Belongings returned. Pt stable at discharge.

## 2016-10-20 NOTE — Progress Notes (Signed)
CSW spoke to Tami Lin from [facility] care group home re pt discharge today. She can pick him up when he is ready.  CSW spoke to [place] at [facility] and pt can return to that program tomorrow. Garner Nash, MSW, LCSW Clinical Social Worker 10/20/2016 9:42 AM

## 2016-10-20 NOTE — [facility] Group Notes (Signed)
[facility] Group Notes:  (Nursing/MHT/Case Management/Adjunct)  Date:  10/20/2016  Time:  5:07 AM  Type of Therapy:  Group Therapy  Participation Level:  Did Not Attend    Veva Holes 10/20/2016, 5:07 AM

## 2016-10-20 NOTE — Progress Notes (Signed)
Patient ID: Tony Cunningham, male   DOB: [birth year], 41 y.o.   MRN: [id number]  Quiet, mostly in his room, denied pain, denied SI/SIB/HI/AVH, CBG=439@bedtime , 42 Units Lantus and 5 Units of novoLOG given, Charge Nurse informed the MDOC, patient is asymptomatic and monitored.

## 2016-10-20 NOTE — Progress Notes (Signed)
  [facility] Adult Case Management Discharge Plan :  Will you be returning to the same living situation after discharge:  Yes,  group home At discharge, do you have transportation home?: Yes,  group home Do you have the ability to pay for your medications: Yes,  medicaid  Release of information consent forms completed and in the chart;  Patient's signature needed at discharge.  Patient to Follow up at: Follow-up Information    [facility]. Go to.   Why:  Can return to day program the day after discharge.  Group home will transport.   Contact information: [address] [place], [place] [address] Phone: [phone] Fax: [phone]       [facility] [place]. Go on 10/21/2016.   Why:  Please attend your follow up appointment with Dr. Thana Ates on Wednesday, 10/21/16, at 3:00pm.  Please bring a copy of your hospital discharge paperwork. Contact information: [address] [place] [address] [phone]           Next level of care provider has access to [facility] Link:no  Safety Planning and Suicide Prevention discussed: No. Attempts made.  Have you used any form of tobacco in the last 30 days? (Cigarettes, Smokeless Tobacco, Cigars, and/or Pipes): Yes  Has patient been referred to the Quitline?: Patient refused referral  Patient has been referred for addiction treatment: Yes  [name], LCSW 10/20/2016, 11:34 AM

## 2016-10-20 NOTE — Plan of Care (Signed)
Problem: Activity: Goal: Sleeping patterns will improve Outcome: Progressing Patient slept for Estimated Hours of 8; every 15 minutes safety round maintained, no injury or falls during this shift.    

## 2016-10-20 NOTE — Discharge Instructions (Signed)
Patient will need to be taken weekly to [facility] for blood work (CBC with differential). [facility] will only dispense Clozaril weekly after [facility] faxes the results to them.  [facility] has already received the order for blood work and instructions to fax results to pharmacy ASAP.  There cannot be delays as the patient cannot be without the medication for longer than 48 hours

## 2016-10-20 NOTE — [place] FL2 (Signed)
[place] MEDICAID FL2 LEVEL OF CARE SCREENING TOOL     IDENTIFICATION  Patient Name: Tony Cunningham Birthdate: 17-Feb-1976 Sex: male Admission Date (Current Location): 10/08/2016  [place] and [place] Number:  [address] [id number] T Facility and Address:  [facility], [address] [place], [place] [address]      Provider Number: [id number]  Attending Physician Name and Address:  Van Clines*  Relative Name and Phone Number:  [name], mother.  [phone]    Current Level of Care: Domiciliary (Rest home) Recommended Level of Care: Family Care Home Prior Approval Number:    Date Approved/Denied:   PASRR Number:    Discharge Plan: Other (Comment)    Current Diagnoses: Patient Active Problem List   Diagnosis Date Noted  . Schizoaffective disorder, depressive type (HCC) 09/23/2016  . Polysubstance abuse 07/17/2016  . Tardive dyskinesia 07/16/2016  . Tobacco use disorder 07/07/2016  . Dyslipidemia 07/07/2016  . Asthma 07/07/2016  . HTN (hypertension) 07/06/2016  . Diabetes (HCC) 12/25/2010    Orientation RESPIRATION BLADDER Height & Weight     Self, Time, Situation, Place  Normal Continent Weight: 163 lb (73.9 kg) Height:  5\' 9"  (175.3 cm)  BEHAVIORAL SYMPTOMS/MOOD NEUROLOGICAL BOWEL NUTRITION STATUS     (na) Continent  (diabetic)  AMBULATORY STATUS COMMUNICATION OF NEEDS Skin   Independent Verbally Normal                       Personal Care Assistance Level of Assistance   (na)           Functional Limitations Info   (na)          SPECIAL CARE FACTORS FREQUENCY   (nana)                    Contractures Contractures Info: Not present    Additional Factors Info   (diabetic)               Current Medications (10/20/2016):  This is the current hospital active medication list Current Facility-Administered Medications  Medication Dose Route Frequency Provider Last Rate Last Dose  .  acetaminophen (TYLENOL) tablet 650 mg  650 mg Oral Q6H PRN [name], 10/22/2016, MD   650 mg at 10/13/16 0156  . alum & mag hydroxide-simeth (MAALOX/MYLANTA) 200-200-20 MG/5ML suspension 30 mL  30 mL Oral Q4H PRN [name], [name], MD      . cloZAPine (CLOZARIL) tablet 100 mg  100 mg Oral QHS Hernandez-Gonzalez, 10/15/16, MD      . dextrose (GLUTOSE) 40 % oral gel 37.5 g  1 Tube Oral Once PRN Sue Lush, MD      . fluvoxaMINE (LUVOX) tablet 50 mg  50 mg Oral Daily [name], MD   50 mg at 10/19/16 1653  . insulin aspart (novoLOG) injection 0-15 Units  0-15 Units Subcutaneous TID WC 07-24-1984, MD   8 Units at 10/20/16 [phone]  . insulin aspart (novoLOG) injection 0-5 Units  0-5 Units Subcutaneous QHS 5038, MD   5 Units at 10/19/16 2124  . insulin aspart (novoLOG) injection 5 Units  5 Units Subcutaneous TID WC 2125, MD   5 Units at 10/19/16 1655  . insulin glargine (LANTUS) injection 42 Units  42 Units Subcutaneous QHS 10/21/16, MD   42 Units at 10/19/16 2122  . ipratropium (ATROVENT) 0.06 % nasal spray 1 spray  1 spray Each Nare QPC supper 2123, MD   1 spray at 10/16/16  1643  . lidocaine (LIDODERM) 5 % 1 patch  1 patch Transdermal Q24H Hernandez-Gonzalez, Andrea, MD   1 patch at 10/20/16 0855  . lisinopril (PRINIVIL,ZESTRIL) tablet 20 mg  20 mg Oral Daily [name], [name], MD   20 mg at 10/20/16 0844  . magnesium hydroxide (MILK OF MAGNESIA) suspension 30 mL  30 mL Oral Daily PRN [name], [name], MD      . meloxicam (MOBIC) tablet 7.5 mg  7.5 mg Oral BID Hernandez-Gonzalez, Andrea, MD   7.5 mg at 10/20/16 0844  . metFORMIN (GLUCOPHAGE) tablet 1,000 mg  1,000 mg Oral BID WC [name], [name], MD   1,000 mg at 10/20/16 0843  . montelukast (SINGULAIR) tablet 10 mg  10 mg Oral Daily Hernandez-Gonzalez, Andrea, MD   10 mg at 10/20/16 0842  . nicotine (NICODERM CQ - dosed in mg/24 hours)  patch 21 mg  21 mg Transdermal Daily Hernandez-Gonzalez, Andrea, MD   21 mg at 10/19/16 0830  . polyethylene glycol (MIRALAX / GLYCOLAX) packet 17 g  17 g Oral Daily Hernandez-Gonzalez, Andrea, MD   17 g at 10/20/16 0843  . propranolol (INDERAL) tablet 20 mg  20 mg Oral BID Hernandez-Gonzalez, Andrea, MD   20 mg at 10/20/16 0843  . simvastatin (ZOCOR) tablet 40 mg  40 mg Oral QHS Hernandez-Gonzalez, Andrea, MD   40 mg at 10/19/16 1654     Discharge Medications: Please see discharge summary for a list of discharge medications.  Relevant Imaging Results:  Relevant Lab Results:   Additional Information None  Deran Barro Jon, LCSW    

## 2016-10-20 NOTE — [facility] Suicide Risk Assessment (Signed)
[facility] Discharge Suicide Risk Assessment   Principal Problem: Schizoaffective disorder, depressive type [facility]) Discharge Diagnoses:  Patient Active Problem List   Diagnosis Date Noted  . Schizoaffective disorder, depressive type (HCC) [F25.1] 09/23/2016  . Polysubstance abuse [F19.10] 07/17/2016  . Tardive dyskinesia [G24.01] 07/16/2016  . Tobacco use disorder [F17.200] 07/07/2016  . Dyslipidemia [E78.5] 07/07/2016  . Asthma [J45.909] 07/07/2016  . HTN (hypertension) [I10] 07/06/2016  . Diabetes (HCC) [E11.9] 12/25/2010    Psychiatric Specialty Exam: ROS  Blood pressure 103/76, pulse 97, temperature 98.7 F (37.1 C), resp. rate 18, height 5\' 9"  (1.753 m), weight 73.9 kg (163 lb), SpO2 98 %.Body mass index is 24.07 kg/m.                                                       Mental Status Per Nursing Assessment::   On Admission:     Demographic Factors:  Male and Caucasian  Loss Factors: Decrease in vocational status and Decline in physical health  Historical Factors: Family history of mental illness or substance abuse  Risk Reduction Factors:   Living with another person, especially a relative and Positive social support  No access to guns  Continued Clinical Symptoms:  Previous Psychiatric Diagnoses and Treatments  Cognitive Features That Contribute To Risk:  Closed-mindedness and Loss of executive function    Suicide Risk:  Minimal: No identifiable suicidal ideation.  Patients presenting with no risk factors but with morbid ruminations; may be classified as minimal risk based on the severity of the depressive symptoms  Follow-up Information    [facility] Follow up on 10/20/2016.   Why:  Hospital discharge follow up appt at [facility] on 5/29 at 2:30 PM.  Will be scheduled for medications managment, including management of Clozaril if prescribed, at this appointment.   Contact information: [address] [place]  [place] [phone]        [facility]. Go to.   Why:  Can return to day program the day after discharge.  Group home will transport.   Contact information: [address] [place], [place] Phone: [phone] Fax: [phone]           [phone], MD 10/20/2016, 9:21 AM
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
## 2016-10-20 NOTE — [facility] Group Notes (Signed)
[facility] LCSW Group Therapy Note  Date/Time: 10/20/16, 1500  Type of Therapy/Topic:  Group Therapy:  Feelings about Diagnosis  Participation Level:  Did Not Attend   Mood:   Description of Group:    This group will allow patients to explore their thoughts and feelings about diagnoses they have received. Patients will be guided to explore their level of understanding and acceptance of these diagnoses. Facilitator will encourage patients to process their thoughts and feelings about the reactions of others to their diagnosis, and will guide patients in identifying ways to discuss their diagnosis with significant others in their lives. This group will be process-oriented, with patients participating in exploration of their own experiences as well as giving and receiving support and challenge from other group members.   Therapeutic Goals: 1. Patient will demonstrate understanding of diagnosis as evidence by identifying two or more symptoms of the disorder:  2. Patient will be able to express two feelings regarding the diagnosis 3. Patient will demonstrate ability to communicate their needs through discussion and/or role plays  Summary of Patient Progress:        Therapeutic Modalities:   Cognitive Behavioral Therapy Brief Therapy Feelings Identification   Greg Boston Cookson, LCSW 

## 2016-10-20 NOTE — Progress Notes (Signed)
Recreation Therapy Notes  Date: 05.29.18 Time: 9:30 am Location: Craft Room  Group Topic: Self-expression  Goal Area(s) Addresses:  Patient will identify one color per emotion listed on wheel. Patient will verbalize benefit of using art as a means of self-expression. Patient will verbalize one emotion experienced during session. Patient will be educated on other forms of self-expression.  Behavioral Response: Did not attend  Intervention: Emotion Wheel  Activity: Patients were given an Emotion Wheel worksheet and were instructed to pick a color for each emotion listed on the wheel.  Education: LRT educated patients on other forms of self-expression.   Education Outcome: Patient did not attend group.  Clinical Observations/Feedback: Patient did not attend group.  Jacquelynn Cree, LRT/CTRS 10/20/2016 10:03 AM

## 2016-10-21 ENCOUNTER — Encounter: Payer: Self-pay | Admitting: Intensive Care

## 2016-10-21 ENCOUNTER — Emergency Department
Admission: EM | Admit: 2016-10-21 | Discharge: 2016-11-06 | Disposition: A | Discharge status: Other Acute Inpatient Hospital | Payer: Medicare Other | Attending: Emergency Medicine | Admitting: Emergency Medicine

## 2016-10-21 ENCOUNTER — Emergency Department: Payer: Medicare Other

## 2016-10-21 DIAGNOSIS — Z794 Long term (current) use of insulin: Secondary | ICD-10-CM | POA: Diagnosis not present

## 2016-10-21 DIAGNOSIS — J45909 Unspecified asthma, uncomplicated: Secondary | ICD-10-CM | POA: Diagnosis present

## 2016-10-21 DIAGNOSIS — F1910 Other psychoactive substance abuse, uncomplicated: Secondary | ICD-10-CM | POA: Insufficient documentation

## 2016-10-21 DIAGNOSIS — G2401 Drug induced subacute dyskinesia: Secondary | ICD-10-CM | POA: Insufficient documentation

## 2016-10-21 DIAGNOSIS — F17210 Nicotine dependence, cigarettes, uncomplicated: Secondary | ICD-10-CM | POA: Insufficient documentation

## 2016-10-21 DIAGNOSIS — R45851 Suicidal ideations: Secondary | ICD-10-CM | POA: Diagnosis not present

## 2016-10-21 DIAGNOSIS — Z79899 Other long term (current) drug therapy: Secondary | ICD-10-CM | POA: Diagnosis not present

## 2016-10-21 DIAGNOSIS — I10 Essential (primary) hypertension: Secondary | ICD-10-CM | POA: Diagnosis not present

## 2016-10-21 DIAGNOSIS — R440 Auditory hallucinations: Secondary | ICD-10-CM

## 2016-10-21 DIAGNOSIS — F251 Schizoaffective disorder, depressive type: Secondary | ICD-10-CM

## 2016-10-21 DIAGNOSIS — E119 Type 2 diabetes mellitus without complications: Secondary | ICD-10-CM | POA: Diagnosis not present

## 2016-10-21 LAB — CBC
HEMATOCRIT: 42.2 % (ref 40.0–52.0)
HEMOGLOBIN: 15 g/dL (ref 13.0–18.0)
MCH: 31.9 pg (ref 26.0–34.0)
MCHC: 35.6 g/dL (ref 32.0–36.0)
MCV: 89.8 fL (ref 80.0–100.0)
Platelets: 213 10*3/uL (ref 150–440)
RBC: 4.7 MIL/uL (ref 4.40–5.90)
RDW: 12.8 % (ref 11.5–14.5)
WBC: 8.2 10*3/uL (ref 3.8–10.6)

## 2016-10-21 LAB — URINE DRUG SCREEN, QUALITATIVE (ARMC ONLY)
AMPHETAMINES, UR SCREEN: NOT DETECTED
BENZODIAZEPINE, UR SCRN: POSITIVE — AB
Barbiturates, Ur Screen: NOT DETECTED
COCAINE METABOLITE, UR ~~LOC~~: NOT DETECTED
Cannabinoid 50 Ng, Ur ~~LOC~~: NOT DETECTED
MDMA (Ecstasy)Ur Screen: NOT DETECTED
METHADONE SCREEN, URINE: NOT DETECTED
OPIATE, UR SCREEN: NOT DETECTED
Phencyclidine (PCP) Ur S: NOT DETECTED
TRICYCLIC, UR SCREEN: NOT DETECTED

## 2016-10-21 LAB — COMPREHENSIVE METABOLIC PANEL
ALBUMIN: 3.5 g/dL (ref 3.5–5.0)
ALT: 29 U/L (ref 17–63)
ANION GAP: 7 (ref 5–15)
AST: 27 U/L (ref 15–41)
Alkaline Phosphatase: 66 U/L (ref 38–126)
BUN: 9 mg/dL (ref 6–20)
CHLORIDE: 98 mmol/L — AB (ref 101–111)
CO2: 28 mmol/L (ref 22–32)
Calcium: 8.7 mg/dL — ABNORMAL LOW (ref 8.9–10.3)
Creatinine, Ser: 0.69 mg/dL (ref 0.61–1.24)
GFR calc Af Amer: >60 mL/min (ref >60)
GFR calc non Af Amer: >60 mL/min (ref >60)
Glucose, Bld: 319 mg/dL — ABNORMAL HIGH (ref 65–99)
POTASSIUM: 4 mmol/L (ref 3.5–5.1)
SODIUM: 133 mmol/L — AB (ref 135–145)
Total Bilirubin: 0.6 mg/dL (ref 0.3–1.2)
Total Protein: 5.8 g/dL — ABNORMAL LOW (ref 6.5–8.1)

## 2016-10-21 LAB — ETHANOL: Alcohol, Ethyl (B): <5 mg/dL (ref <5)

## 2016-10-21 LAB — ACETAMINOPHEN LEVEL

## 2016-10-21 LAB — GLUCOSE, CAPILLARY
GLUCOSE-CAPILLARY: 475 mg/dL — AB (ref 65–99)
GLUCOSE-CAPILLARY: 416 mg/dL — AB (ref 65–99)
GLUCOSE-CAPILLARY: 291 mg/dL — AB (ref 65–99)

## 2016-10-21 LAB — SALICYLATE LEVEL

## 2016-10-21 MED ORDER — POLYETHYLENE GLYCOL 3350 17 G PO PACK
17.0000 g | PACK | Freq: Every day | ORAL | Status: DC
  Administered 2016-10-21 – 2016-10-24 (×2): 17 g via ORAL
  Filled 2016-10-21 (×10): qty 1

## 2016-10-21 MED ORDER — SIMVASTATIN 40 MG PO TABS
40.0000 mg | ORAL_TABLET | Freq: Every day | ORAL | Status: DC
  Administered 2016-10-22 – 2016-11-06 (×16): 40 mg via ORAL
  Filled 2016-10-21 (×17): qty 1

## 2016-10-21 MED ORDER — METFORMIN HCL 500 MG PO TABS
1000.0000 mg | ORAL_TABLET | Freq: Two times a day (BID) | ORAL | Status: DC
  Administered 2016-10-21 – 2016-11-06 (×33): 1000 mg via ORAL
  Filled 2016-10-21 (×33): qty 2

## 2016-10-21 MED ORDER — LISINOPRIL 20 MG PO TABS
20.0000 mg | ORAL_TABLET | Freq: Every day | ORAL | Status: DC
  Administered 2016-10-21 – 2016-11-06 (×16): 20 mg via ORAL
  Filled 2016-10-21 (×4): qty 1
  Filled 2016-10-21: qty 2
  Filled 2016-10-21 (×12): qty 1

## 2016-10-21 MED ORDER — ACETAMINOPHEN 325 MG PO TABS
650.0000 mg | ORAL_TABLET | Freq: Four times a day (QID) | ORAL | Status: DC | PRN
  Administered 2016-10-21: 650 mg via ORAL
  Filled 2016-10-21: qty 2

## 2016-10-21 MED ORDER — INSULIN ASPART 100 UNIT/ML ~~LOC~~ SOLN
5.0000 [IU] | Freq: Three times a day (TID) | SUBCUTANEOUS | Status: DC
  Administered 2016-10-22 – 2016-11-06 (×45): 5 [IU] via SUBCUTANEOUS
  Filled 2016-10-21: qty 8
  Filled 2016-10-21: qty 5
  Filled 2016-10-21: qty 7
  Filled 2016-10-21: qty 5
  Filled 2016-10-21: qty 7
  Filled 2016-10-21 (×13): qty 5
  Filled 2016-10-21: qty 8
  Filled 2016-10-21 (×6): qty 5
  Filled 2016-10-21: qty 1
  Filled 2016-10-21 (×4): qty 5
  Filled 2016-10-21: qty 8
  Filled 2016-10-21 (×9): qty 5
  Filled 2016-10-21: qty 8
  Filled 2016-10-21: qty 5

## 2016-10-21 MED ORDER — MELOXICAM 7.5 MG PO TABS
7.5000 mg | ORAL_TABLET | Freq: Two times a day (BID) | ORAL | Status: DC
  Administered 2016-10-21 – 2016-11-06 (×33): 7.5 mg via ORAL
  Filled 2016-10-21 (×33): qty 1

## 2016-10-21 MED ORDER — INSULIN ASPART 100 UNIT/ML ~~LOC~~ SOLN
0.0000 [IU] | Freq: Three times a day (TID) | SUBCUTANEOUS | Status: DC
  Administered 2016-10-21: 15 [IU] via SUBCUTANEOUS
  Administered 2016-10-22: 8 [IU] via SUBCUTANEOUS
  Administered 2016-10-22 (×2): 3 [IU] via SUBCUTANEOUS
  Administered 2016-10-23 (×3): 2 [IU] via SUBCUTANEOUS
  Administered 2016-10-24 – 2016-10-27 (×5): 3 [IU] via SUBCUTANEOUS
  Administered 2016-10-27: 2 [IU] via SUBCUTANEOUS
  Administered 2016-10-28: 3 [IU] via SUBCUTANEOUS
  Administered 2016-10-28: 5 [IU] via SUBCUTANEOUS
  Administered 2016-10-29: 8 [IU] via SUBCUTANEOUS
  Administered 2016-10-29 – 2016-10-30 (×2): 5 [IU] via SUBCUTANEOUS
  Administered 2016-10-30: 8 [IU] via SUBCUTANEOUS
  Administered 2016-10-30 – 2016-10-31 (×2): 3 [IU] via SUBCUTANEOUS
  Administered 2016-10-31: 2 [IU] via SUBCUTANEOUS
  Administered 2016-11-01: 3 [IU] via SUBCUTANEOUS
  Administered 2016-11-02: 5 [IU] via SUBCUTANEOUS
  Administered 2016-11-02: 3 [IU] via SUBCUTANEOUS
  Administered 2016-11-02: 5 [IU] via SUBCUTANEOUS
  Administered 2016-11-03 (×2): 3 [IU] via SUBCUTANEOUS
  Administered 2016-11-04 (×3): 2 [IU] via SUBCUTANEOUS
  Administered 2016-11-05 – 2016-11-06 (×5): 3 [IU] via SUBCUTANEOUS
  Filled 2016-10-21 (×3): qty 3
  Filled 2016-10-21 (×2): qty 2
  Filled 2016-10-21 (×2): qty 5
  Filled 2016-10-21 (×2): qty 3
  Filled 2016-10-21: qty 8
  Filled 2016-10-21: qty 7
  Filled 2016-10-21 (×2): qty 3
  Filled 2016-10-21: qty 5
  Filled 2016-10-21: qty 3
  Filled 2016-10-21: qty 8
  Filled 2016-10-21: qty 15
  Filled 2016-10-21 (×2): qty 3
  Filled 2016-10-21: qty 2
  Filled 2016-10-21: qty 3
  Filled 2016-10-21: qty 8
  Filled 2016-10-21: qty 2

## 2016-10-21 MED ORDER — FLUVOXAMINE MALEATE 50 MG PO TABS
50.0000 mg | ORAL_TABLET | Freq: Every day | ORAL | Status: DC
  Administered 2016-10-21 – 2016-11-06 (×17): 50 mg via ORAL
  Filled 2016-10-21 (×17): qty 1

## 2016-10-21 MED ORDER — PROPRANOLOL HCL 10 MG PO TABS
20.0000 mg | ORAL_TABLET | Freq: Two times a day (BID) | ORAL | Status: DC
  Administered 2016-10-21 – 2016-11-06 (×33): 20 mg via ORAL
  Filled 2016-10-21 (×5): qty 2
  Filled 2016-10-21: qty 1
  Filled 2016-10-21 (×29): qty 2

## 2016-10-21 MED ORDER — INSULIN GLARGINE 100 UNIT/ML ~~LOC~~ SOLN
42.0000 [IU] | Freq: Every day | SUBCUTANEOUS | Status: DC
  Administered 2016-10-21 – 2016-11-06 (×17): 42 [IU] via SUBCUTANEOUS
  Filled 2016-10-21 (×25): qty 0.42

## 2016-10-21 MED ORDER — MONTELUKAST SODIUM 10 MG PO TABS
10.0000 mg | ORAL_TABLET | Freq: Every day | ORAL | Status: DC
  Administered 2016-10-21 – 2016-11-06 (×17): 10 mg via ORAL
  Filled 2016-10-21 (×20): qty 1

## 2016-10-21 MED ORDER — CLOZAPINE 100 MG PO TABS
100.0000 mg | ORAL_TABLET | Freq: Every day | ORAL | Status: DC
  Administered 2016-10-21 – 2016-10-25 (×5): 100 mg via ORAL
  Filled 2016-10-21: qty 1
  Filled 2016-10-21: qty 4
  Filled 2016-10-21 (×3): qty 1

## 2016-10-21 NOTE — ED Notes (Signed)

## 2016-10-21 NOTE — ED Notes (Signed)
Gave patient a sprite. 

## 2016-10-21 NOTE — ED Triage Notes (Addendum)
Patient reports "I feel suicidal and hearing voices telling me to hurt myself" No plan. [facility] is here voluntary [facility]. Calm and cooperative in triage. Patient c/o neck pain from a fall X2 week ago. No acute distress noted in triage

## 2016-10-21 NOTE — BH Assessment (Addendum)
Tele Assessment Note   Tony Cunningham is an 41 y.o. male presenting voluntarily for assessment. Pt has h/o Schizoaffective d/o, multiple inpatient admissions and two suicide attempts (per chart). Pt provides minimal responses to clinician and states he does not feel like speaking. Pt reports SI. Pt denies plan but, does report intent to harm himself. Pt responds "I don't know" when asked about HI. Pt initially denied hallucinations and then reported AVH. Pt reports no sleep x38hrs.   The following was obtain from this encounters psychiatry consult:  Patient interviewed chart reviewed. Patient familiar to me from previous encounters. 41 year old man with chronic mental illness. He was discharged from the inpatient psychiatric unit at our hospital yesterday afternoon. He was to go to a group home. At the time of discharge it was documented clinically that the patient appeared to be doing well and was not reporting any psychosis or suicidality. And tells me that after he went to the group home last night he started to feel bad. He started to have auditory hallucinations which she says tell him to cut himself. He also started to feel like he was going to hurt himself and that he couldn't stand to stay there. He inform the group home today and they brought him back over to the hospital. Patient is unclear about whether he actually received his medication any time since his discharge. He does not report however any specific stressor that he can clearly identify ref  Diagnosis: dx  Past Medical History:  Past Medical History:  Diagnosis Date  . Anxiety   . Asthma   . Diabetes mellitus   . High blood pressure   . Sinus complaint     History reviewed. No pertinent surgical history.  Family History: History reviewed. No pertinent family history.  Social History:  reports that he has been smoking Cigarettes.  He has been smoking about 0.50 packs per day. He has never used smokeless tobacco. He reports  that he does not drink alcohol or use drugs.  Additional Social History:  Alcohol / Drug Use Pain Medications: Pt denies abuse. Prescriptions: Pt denies abuse. Over the Counter: Pt denies abuse. History of alcohol / drug use?: No history of alcohol / drug abuse  CIWA: CIWA-Ar BP: 124/72 Pulse Rate: 84 COWS:    PATIENT STRENGTHS: (choose at least two) Average or above average intelligence General fund of knowledge  Allergies: No Known Allergies  Home Medications:  (Not in a hospital admission)  OB/GYN Status:  No LMP for male patient.  General Assessment Data Location of Assessment: [facility] ED TTS Assessment: In system Is this a Tele or Face-to-Face Assessment?: Face-to-Face Is this an Initial Assessment or a Re-assessment for this encounter?: Initial Assessment Marital status: Single Is patient pregnant?: No Pregnancy Status: No Living Arrangements: Group Home Can pt return to current living arrangement?: Yes Admission Status: Voluntary Is patient capable of signing voluntary admission?: Yes Referral Source: Other (Group Home) Insurance type: Medicare     Crisis Care Plan Living Arrangements: Group Home Name of Psychiatrist: UTA Name of Therapist: UTA  Education Status Is patient currently in school?: No Highest grade of school patient has completed: 11  Risk to self with the past 6 months Suicidal Ideation: Yes-Currently Present Has patient been a risk to self within the past 6 months prior to admission? : Yes Suicidal Intent: Yes-Currently Present Has patient had any suicidal intent within the past 6 months prior to admission? : Yes Is patient at risk for suicide?: Yes  Suicidal Plan?: No-Not Currently/Within Last 6 Months Has patient had any suicidal plan within the past 6 months prior to admission? : Yes Specify Current Suicidal Plan: None Reported Access to Means:  (C) Specify Access to Suicidal Means: UTA What has been your use of drugs/alcohol within  the last 12 months?: Pt denies drug/alcohol use. Previous Attempts/Gestures: Yes How many times?: 2 Other Self Harm Risks: UTA, None per chart Triggers for Past Attempts: Unknown Intentional Self Injurious Behavior: None Family Suicide History: Unknown Recent stressful life event(s):  (UTA) Persecutory voices/beliefs?:  (UTA) Depression:  (UTA) Depression Symptoms:  (UTA) Substance abuse history and/or treatment for substance abuse?: No Suicide prevention information given to non-admitted patients: Not applicable  Risk to Others within the past 6 months Homicidal Ideation:  ("I don't know") Does patient have any lifetime risk of violence toward others beyond the six months prior to admission? :  (UTA) Thoughts of Harm to Others:  (UTA) Current Homicidal Intent:  (UTA) Current Homicidal Plan:  (UTA) Access to Homicidal Means:  (UTA) History of harm to others?:  (UTA) Does patient have access to weapons?:  (UTA) Criminal Charges Pending?:  (UTA) Does patient have a court date:  (UTA) Is patient on probation?:  (UTA)  Psychosis Hallucinations: Auditory, Visual (Pt initially denied, then reported AVH ) Delusions: None noted  Mental Status Report Appearance/Hygiene: In scrubs Eye Contact: Poor Motor Activity: Unremarkable Speech: Logical/coherent, Soft Level of Consciousness: Quiet/awake Mood: Depressed Affect: Appropriate to circumstance Anxiety Level: Minimal Thought Processes: Coherent, Relevant Judgement: Unimpaired Orientation: Person, Place, Time, Situation Obsessive Compulsive Thoughts/Behaviors: None  Cognitive Functioning Concentration: Decreased Memory: Recent Intact, Remote Intact IQ: Average Insight: Fair Impulse Control: Fair Appetite:  (UTA) Weight Loss:  (UTA) Weight Gain:  (UTA) Sleep: No Change Total Hours of Sleep:  (Pt reports no sleep x 38hrs) Vegetative Symptoms: Unable to Assess  ADLScreening [facility] Assessment Services) Patient's cognitive  ability adequate to safely complete daily activities?: Yes Patient able to express need for assistance with ADLs?: Yes Independently performs ADLs?: Yes (appropriate for developmental age)  Prior Inpatient Therapy Prior Inpatient Therapy: Yes Prior Therapy Dates: Multiple Prior Therapy Facilty/Provider(s): [facility] Reason for Treatment: Schizoaffective d/o  Prior Outpatient Therapy Prior Outpatient Therapy: Yes Prior Therapy Dates: Unkwn Prior Therapy Facilty/Provider(s): Unkwn Reason for Treatment: Schizoaffective d/o Does patient have an ACCT team?: No Does patient have Intensive In-House Services?  : No Does patient have [facility] services? : Unknown Does patient have P4CC services?: No  ADL Screening (condition at time of admission) Patient's cognitive ability adequate to safely complete daily activities?: Yes Is the patient deaf or have difficulty hearing?: No Does the patient have difficulty seeing, even when wearing glasses/contacts?: No Does the patient have difficulty concentrating, remembering, or making decisions?: Yes Patient able to express need for assistance with ADLs?: Yes Does the patient have difficulty dressing or bathing?: No Independently performs ADLs?: Yes (appropriate for developmental age) Does the patient have difficulty walking or climbing stairs?: No Weakness of Legs: None Weakness of Arms/Hands: None  Home Assistive Devices/Equipment Home Assistive Devices/Equipment: None  Therapy Consults (therapy consults require a physician order) PT Evaluation Needed: No OT Evalulation Needed: No SLP Evaluation Needed: No Abuse/Neglect Assessment (Assessment to be complete while patient is alone) Physical Abuse: Denies Verbal Abuse: Denies Sexual Abuse: Denies Exploitation of patient/patient's resources: Denies Self-Neglect: Denies Values / Beliefs Cultural Requests During Hospitalization: None Spiritual Requests During Hospitalization:  None Consults Spiritual Care Consult Needed: No Social Work Consult Needed: No Merchant navy officer (  For Healthcare) Does Patient Have a Medical Advance Directive?: No Would patient like information on creating a medical advance directive?: No - Patient declined    Additional Information 1:1 In Past 12 Months?: No CIRT Risk: No Elopement Risk: No Does patient have medical clearance?: No     Disposition: Per Dr.[name], pt will be observed overnight and revaluated in the AM. [facility] referral pending revaluation.  Disposition Initial Assessment Completed for this Encounter: Yes Disposition of Patient: Other dispositions Type of inpatient treatment program: Adult Other disposition(s): Other (Comment) (Dr.[name] recommends [name] referral)  Eryn Krejci J [place] 10/21/2016 4:06 PM

## 2016-10-21 NOTE — Progress Notes (Signed)
CSW received voicemail from [place] at [facility] stating that Dr Thana Ates can prescribe clozaril.  CSW called Tami Lin at group home and informed her of this.  Tami Lin is planning to take client to [facility] for follow up. Garner Nash, MSW, LCSW Clinical Social Worker 10/21/2016 8:30 AM

## 2016-10-21 NOTE — ED Notes (Signed)
Pt. BS checked for BHU clearance, pt. Had BS of 291.

## 2016-10-21 NOTE — ED Notes (Signed)
PT IVC/ PENDING PLACEMENT  

## 2016-10-21 NOTE — ED Notes (Signed)
Pt ambulated to bathroom and is back in bed resting.

## 2016-10-21 NOTE — ED Provider Notes (Signed)
-----------------------------------------   6:52 PM on 10/21/2016 -----------------------------------------  Several hours ago was informed that the patient's blood sugar was over 400.  This necessitated the nurse pressing along the information to me.  I encouraged her to continue to administer insulin as per the sliding-scale insulin order.  The patient is in no acute distress and still awaiting disposition plans   Loleta Rose, MD 10/21/16 [phone]

## 2016-10-21 NOTE — ED Provider Notes (Signed)
[facility] Emergency Department Provider Note  Time seen: 1:35 PM  I have reviewed the triage vital signs and the nursing notes.   HISTORY  Chief Complaint Suicidal    HPI Tony Cunningham is a 41 y.o. male with a past medical history of anxiety, asthma, diabetes, schizophrenia, presents to the emergency department hearing voices telling him to kill himself. According to the patient for the past few days he has been hearing voices telling him to you go into traffic to kill himself. Patient denies any substance use. Patient's lung medical complaint is neck pain for the past 2 weeks since falling down steps. Denies any weakness or numbness. States mild headache.  Past Medical History:  Diagnosis Date  . Anxiety   . Asthma   . Diabetes mellitus   . High blood pressure   . Sinus complaint     Patient Active Problem List   Diagnosis Date Noted  . Schizoaffective disorder, depressive type (HCC) 09/23/2016  . Polysubstance abuse 07/17/2016  . Tardive dyskinesia 07/16/2016  . Tobacco use disorder 07/07/2016  . Dyslipidemia 07/07/2016  . Asthma 07/07/2016  . HTN (hypertension) 07/06/2016  . Diabetes (HCC) 12/25/2010    History reviewed. No pertinent surgical history.  Prior to Admission medications   Medication Sig Start Date End Date Taking? Authorizing Provider  cloZAPine (CLOZARIL) 100 MG tablet Take 1 tablet (100 mg total) by mouth daily. Daily at 1700 10/20/16   [name], MD  cyclobenzaprine (FLEXERIL) 10 MG tablet Take 1 tablet (10 mg total) by mouth 3 (three) times daily as needed for muscle spasms. 10/07/16   [name], [name], MD  fluvoxaMINE (LUVOX) 50 MG tablet Take 1 tablet (50 mg total) by mouth daily. Daily at 1700 10/20/16   [name], MD  insulin aspart (NOVOLOG) 100 UNIT/ML injection Inject 5 Units into the skin 3 (three) times daily with meals. 10/20/16   [name], MD  insulin glargine (LANTUS)  100 UNIT/ML injection Inject 0.42 mLs (42 Units total) into the skin at bedtime. 10/20/16   [name], MD  ipratropium (ATROVENT) 0.06 % nasal spray Place 1 spray into both nostrils daily after supper. 10/20/16   [name], MD  lidocaine (LIDODERM) 5 % Place 1 patch onto the skin daily. Remove & Discard patch within 12 hours or as directed by MD 10/21/16   [name], MD  lisinopril (PRINIVIL,ZESTRIL) 20 MG tablet Take 1 tablet (20 mg total) by mouth daily. 10/03/16   [name], MD  meloxicam (MOBIC) 7.5 MG tablet Take 1 tablet (7.5 mg total) by mouth 2 (two) times daily. 10/20/16   [name], MD  metFORMIN (GLUCOPHAGE) 1000 MG tablet Take 1 tablet (1,000 mg total) by mouth 2 (two) times daily with a meal. 07/16/16   [name], MD  montelukast (SINGULAIR) 10 MG tablet Take 1 tablet (10 mg total) by mouth at bedtime. For Asthma 02/21/16   Armandina Stammer I, NP  polyethylene glycol (MIRALAX / GLYCOLAX) packet Take 17 g by mouth daily. 10/21/16   [name], MD  propranolol (INDERAL) 20 MG tablet Take 1 tablet (20 mg total) by mouth 2 (two) times daily. 10/20/16   [name], MD  simvastatin (ZOCOR) 40 MG tablet Take 1 tablet (40 mg total) by mouth at bedtime. For high cholesterol 02/21/16   Armandina Stammer I, NP    No Known Allergies  History reviewed. No pertinent family history.  Social History Social History  Substance Use Topics  . Smoking status:  Current Every Day Smoker    Packs/day: 0.50    Types: Cigarettes  . Smokeless tobacco: Never Used  . Alcohol use No    Review of Systems Constitutional: Negative for fever. Cardiovascular: Negative for chest pain. Respiratory: Negative for shortness of breath. Gastrointestinal: Negative for abdominal pain Genitourinary: Negative for dysuria. Musculoskeletal: Mild neck pain Neurological: Mild headache. Denies focal weakness  or numbness. All other ROS negative  ____________________________________________   PHYSICAL EXAM:  VITAL SIGNS: ED Triage Vitals  Enc Vitals Group     BP 10/21/16 1258 124/72     Pulse Rate 10/21/16 1258 84     Resp 10/21/16 1258 16     Temp 10/21/16 1258 98.6 F (37 C)     Temp Source 10/21/16 1258 Oral     SpO2 10/21/16 1258 95 %     Weight 10/21/16 1258 170 lb (77.1 kg)     Height 10/21/16 1258 5\' 9"  (1.753 m)     Head Circumference --      Peak Flow --      Pain Score 10/21/16 1257 8     Pain Loc --      Pain Edu? --      Excl. in GC? --     Constitutional:Alert, no distress, calm and cooperative. Eyes: Normal exam ENT   Head: Normocephalic and atraumatic.   Mouth/Throat: Mucous membranes are moist. Cardiovascular: Normal rate, regular rhythm. No murmur Respiratory: Normal respiratory effort without tachypnea nor retractions. Breath sounds are clear  Gastrointestinal: Soft and nontender. No distention.  Musculoskeletal: Nontender with normal range of motion in all extremities. Neurologic:  Normal speech and language. No gross focal neurologic deficits  Skin:  Skin is warm, dry and intact.  Psychiatric: Mood and affect are normal.  ____________________________________________   INITIAL IMPRESSION / ASSESSMENT AND PLAN / ED COURSE  Pertinent labs & imaging results that were available during my care of the patient were reviewed by me and considered in my medical decision making (see chart for details).  The patient presents to the emergency department with auditory hallucinations telling him to kill himself. We will place the patient under an involuntary commitment. We will have psychiatry and TTS see and evaluate the patient. Patient on medical complains of mild neck pain since a fall 2 weeks ago. No deformities. We'll obtain x-ray imaging as a precaution. We will get labs will awaiting psychiatric  evaluation.  ____________________________________________   FINAL CLINICAL IMPRESSION(S) / ED DIAGNOSES  IVC Suicidal ideation Auditory hallucinations    10/23/16, MD 10/25/16 1421

## 2016-10-21 NOTE — BH Assessment (Signed)
Pt in route to xray. TTS will consult at later time.

## 2016-10-21 NOTE — Progress Notes (Signed)
MEDICATION RELATED CONSULT NOTE - INITIAL   Pharmacy Consult for Clozapine Indication: Schizophrenia  No Known Allergies  Patient Measurements: Height: 5\' 9"  (175.3 cm) Weight: 170 lb (77.1 kg) IBW/kg (Calculated) : 70.7   Vital Signs: Temp: 98.6 F (37 C) (05/30 1258) Temp Source: Oral (05/30 1258) BP: 124/72 (05/30 1258) Pulse Rate: 84 (05/30 1258) Intake/Output from previous day: No intake/output data recorded. Intake/Output from this shift: No intake/output data recorded.  Labs:  Recent Labs  10/20/16 1005 10/21/16 1257  WBC 6.0 8.2  HGB 15.1 15.0  HCT 43.0 42.2  PLT 205 213  CREATININE  --  0.69  ALBUMIN  --  3.5  PROT  --  5.8*  AST  --  27  ALT  --  29  ALKPHOS  --  66  BILITOT  --  0.6   Estimated Creatinine Clearance: 120.3 mL/min (by C-G formula based on SCr of 0.69 mg/dL).   Microbiology: No results found for this or any previous visit (from the past 720 hour(s)).  Medical History: Past Medical History:  Diagnosis Date  . Anxiety   . Asthma   . Diabetes mellitus   . High blood pressure   . Sinus complaint     Medications:  Scheduled:  . cloZAPine  100 mg Oral QPC supper  . fluvoxaMINE  50 mg Oral QPC supper  . insulin aspart  0-15 Units Subcutaneous TID WC  . insulin aspart  5 Units Subcutaneous TID WC  . insulin glargine  42 Units Subcutaneous QHS  . lisinopril  20 mg Oral Daily  . meloxicam  7.5 mg Oral BID PC  . metFORMIN  1,000 mg Oral BID WC  . montelukast  10 mg Oral QHS  . polyethylene glycol  17 g Oral Daily  . propranolol  20 mg Oral BID  . simvastatin  40 mg Oral q1800     Plan:  Clozapine eligible for dispensing per [facility] site. Initial ANC 2700 on 5/29.  Next labs due 10/27/2016  Ponciano Shealy K, RPh 10/21/2016,3:30 PM

## 2016-10-21 NOTE — ED Notes (Signed)
BEHAVIORAL HEALTH ROUNDING Patient sleeping: Yes.   Patient alert and oriented: eyes closed  Appears to be asleep Behavior appropriate: Yes.  ; If no, describe:  Nutrition and fluids offered: Yes  Toileting and hygiene offered: sleeping Sitter present: q 15 minute observations and security monitoring Law enforcement present: yes  ODS 

## 2016-10-21 NOTE — ED Notes (Signed)
BEHAVIORAL HEALTH ROUNDING Patient sleeping: No. Patient alert and oriented: yes Behavior appropriate: Yes.  ; If no, describe:  Nutrition and fluids offered: yes Toileting and hygiene offered: Yes  Sitter present: q15 minute observations and security  monitoring Law enforcement present: Yes  ODS  

## 2016-10-21 NOTE — Consult Note (Signed)
[place] [facility] Consult   Reason for Consult:  Consult for 41 year old man with a history of schizoaffective disorder or schizophrenia brought back from his group home reporting suicidal thoughts and psychosis Referring Physician:  Paduchowski Patient Identification: Tony Cunningham MRN:  [id number] Principal Diagnosis: Schizoaffective disorder, depressive type [facility]) Diagnosis:   Patient Active Problem List   Diagnosis Date Noted  . Schizoaffective disorder, depressive type ([place]) [F25.1] 09/23/2016  . Polysubstance abuse [F19.10] 07/17/2016  . Tardive dyskinesia [G24.01] 07/16/2016  . Tobacco use disorder [F17.200] 07/07/2016  . Dyslipidemia [E78.5] 07/07/2016  . Asthma [J45.909] 07/07/2016  . HTN (hypertension) [I10] 07/06/2016  . Diabetes ([place]) [E11.9] 12/25/2010    Total Time spent with patient: 1 hour  Subjective:   Tony Cunningham is a 41 y.o. male patient admitted with "I got suicidal".  HPI:  Patient interviewed chart reviewed. Patient familiar to me from previous encounters. 41 year old man with chronic mental illness. He was discharged from the inpatient psychiatric unit at our hospital yesterday afternoon. He was to go to a group home. At the time of discharge it was documented clinically that the patient appeared to be doing well and was not reporting any psychosis or suicidality. And tells me that after he went to the group home last night he started to feel bad. He started to have auditory hallucinations which she says tell him to cut himself. He also started to feel like he was going to hurt himself and that he couldn't stand to stay there. He inform the group home today and they brought him back over to the hospital. Patient is unclear about whether he actually received his medication any time since his discharge. He does not report however any specific stressor that he can clearly identify ref  Social history: Patient has no family that are able to take care of him now.  He has been in family care for a long time but that is no longer possible. He [facility] state [facility] a period he has been to our hospital multiple times in the last month or so and it has proven very difficult to get him to stay in an outpatient setting.  Medical history: Patient has asthma hypertension and diabetes which particularly has been difficult to control in the past.  Substance abuse history: Smokes too much. Denies any recent alcohol or drug abuse.  Past Psychiatric History: [name] Has been on multiple antipsychotics. There has been some difficulty finding a medicine that he tolerates and also appears to benefit from. He's had multiple hospitalizations in the past. Has had some self injury and some hostility in the past. Patient's recent hospitalization at our facility involve some medication adjustment and it was clearly documented that his symptoms had improved and that he appeared to be doing well at discharge.  Risk to Self: Is patient at risk for suicide?: Yes Risk to Others:   Prior Inpatient Therapy:   Prior Outpatient Therapy:    Past Medical History:  Past Medical History:  Diagnosis Date  . Anxiety   . Asthma   . Diabetes mellitus   . High blood pressure   . Sinus complaint    History reviewed. No pertinent surgical history. Family History: History reviewed. No pertinent family history. Family Psychiatric  History: Patient does not know of any clear family history Social History:  History  Alcohol Use No     History  Drug Use No  Social History   Social History  . Marital status: Single    Spouse name: [name]  . Number of children: [name]  . Years of education: [name]   Social History Main Topics  . Smoking status: Current Every Day Smoker    Packs/day: 0.50    Types: Cigarettes  . Smokeless tobacco: Never Used  . Alcohol use No  . Drug use: No  . Sexual activity: Not Currently    Other Topics Concern  . None   Social History Narrative  . None   Additional Social History:    Allergies:  No Known Allergies  Labs:  Results for orders placed or performed during the hospital encounter of 10/21/16 (from the past 48 hour(s))  Comprehensive metabolic panel     Status: Abnormal   Collection Time: 10/21/16 12:57 PM  Result Value Ref Range   Sodium 133 (L) 135 - 145 mmol/L   Potassium 4.0 3.5 - 5.1 mmol/L   Chloride 98 (L) 101 - 111 mmol/L   CO2 28 22 - 32 mmol/L   Glucose, Bld 319 (H) 65 - 99 mg/dL   BUN 9 6 - 20 mg/dL   Creatinine, Ser 0.69 0.61 - 1.24 mg/dL   Calcium 8.7 (L) 8.9 - 10.3 mg/dL   Total Protein 5.8 (L) 6.5 - 8.1 g/dL   Albumin 3.5 3.5 - 5.0 g/dL   AST 27 15 - 41 U/L   ALT 29 17 - 63 U/L   Alkaline Phosphatase 66 38 - 126 U/L   Total Bilirubin 0.6 0.3 - 1.2 mg/dL   GFR calc non Af Amer >60 >60 mL/min   GFR calc Af Amer >60 >60 mL/min    Comment: (NOTE) The eGFR has been calculated using the CKD EPI equation. This calculation has not been validated in all clinical situations. eGFR's persistently <60 mL/min signify possible Chronic Kidney Disease.    Anion gap 7 5 - 15  Ethanol     Status: None   Collection Time: 10/21/16 12:57 PM  Result Value Ref Range   Alcohol, Ethyl (B) <5 <5 mg/dL    Comment:        LOWEST DETECTABLE LIMIT FOR SERUM ALCOHOL IS 5 mg/dL FOR MEDICAL PURPOSES ONLY   Salicylate level     Status: None   Collection Time: 10/21/16 12:57 PM  Result Value Ref Range   Salicylate Lvl <3.4 2.8 - 30.0 mg/dL  Acetaminophen level     Status: Abnormal   Collection Time: 10/21/16 12:57 PM  Result Value Ref Range   Acetaminophen (Tylenol), Serum <10 (L) 10 - 30 ug/mL    Comment:        THERAPEUTIC CONCENTRATIONS VARY SIGNIFICANTLY. A RANGE OF 10-30 ug/mL MAY BE AN EFFECTIVE CONCENTRATION FOR MANY PATIENTS. HOWEVER, SOME ARE BEST TREATED AT CONCENTRATIONS OUTSIDE THIS RANGE. ACETAMINOPHEN CONCENTRATIONS >150 ug/mL AT 4  HOURS AFTER INGESTION AND >50 ug/mL AT 12 HOURS AFTER INGESTION ARE OFTEN ASSOCIATED WITH TOXIC REACTIONS.   cbc     Status: None   Collection Time: 10/21/16 12:57 PM  Result Value Ref Range   WBC 8.2 3.8 - 10.6 K/uL   RBC 4.70 4.40 - 5.90 MIL/uL   Hemoglobin 15.0 13.0 - 18.0 g/dL   HCT 42.2 40.0 - 52.0 %   MCV 89.8 80.0 - 100.0 fL   MCH 31.9 26.0 - 34.0 pg   MCHC 35.6 32.0 - 36.0 g/dL   RDW 12.8 11.5 - 14.5 %   Platelets 213 150 - 440  K/uL  Urine Drug Screen, Qualitative     Status: Abnormal   Collection Time: 10/21/16 12:57 PM  Result Value Ref Range   Tricyclic, Ur Screen NONE DETECTED NONE DETECTED   Amphetamines, Ur Screen NONE DETECTED NONE DETECTED   MDMA (Ecstasy)Ur Screen NONE DETECTED NONE DETECTED   Cocaine Metabolite,Ur [place] NONE DETECTED NONE DETECTED   Opiate, Ur Screen NONE DETECTED NONE DETECTED   Phencyclidine (PCP) Ur S NONE DETECTED NONE DETECTED   Cannabinoid 50 Ng, Ur [place] NONE DETECTED NONE DETECTED   Barbiturates, Ur Screen NONE DETECTED NONE DETECTED   Benzodiazepine, Ur Scrn POSITIVE (A) NONE DETECTED   Methadone Scn, Ur NONE DETECTED NONE DETECTED    Comment: (NOTE) 741  Tricyclics, urine               Cutoff 1000 ng/mL 200  Amphetamines, urine             Cutoff 1000 ng/mL 300  MDMA (Ecstasy), urine           Cutoff 500 ng/mL 400  Cocaine Metabolite, urine       Cutoff 300 ng/mL 500  Opiate, urine                   Cutoff 300 ng/mL 600  Phencyclidine (PCP), urine      Cutoff 25 ng/mL 700  Cannabinoid, urine              Cutoff 50 ng/mL 800  Barbiturates, urine             Cutoff 200 ng/mL 900  Benzodiazepine, urine           Cutoff 200 ng/mL 1000 Methadone, urine                Cutoff 300 ng/mL 1100 1200 The urine drug screen provides only a preliminary, unconfirmed 1300 analytical test result and should not be used for non-medical 1400 purposes. Clinical consideration and professional judgment should 1500 be applied to any positive drug screen  result due to possible 1600 interfering substances. A more specific alternate chemical method 1700 must be used in order to obtain a confirmed analytical result.  1800 Gas chromato graphy / mass spectrometry (GC/MS) is the preferred 1900 confirmatory method.     Current Facility-Administered Medications  Medication Dose Route Frequency Provider Last Rate Last Dose  . acetaminophen (TYLENOL) tablet 650 mg  650 mg Oral Q6H PRN Harvest Dark, MD   650 mg at 10/21/16 1343   Current Outpatient Prescriptions  Medication Sig Dispense Refill  . cloZAPine (CLOZARIL) 100 MG tablet Take 1 tablet (100 mg total) by mouth daily. Daily at 1700 30 tablet 0  . cyclobenzaprine (FLEXERIL) 10 MG tablet Take 1 tablet (10 mg total) by mouth 3 (three) times daily as needed for muscle spasms. 60 tablet 0  . fluvoxaMINE (LUVOX) 50 MG tablet Take 1 tablet (50 mg total) by mouth daily. Daily at 1700 30 tablet 0  . insulin aspart (NOVOLOG) 100 UNIT/ML injection Inject 5 Units into the skin 3 (three) times daily with meals. 5 mL 0  . insulin glargine (LANTUS) 100 UNIT/ML injection Inject 0.42 mLs (42 Units total) into the skin at bedtime. 13 mL 0  . ipratropium (ATROVENT) 0.06 % nasal spray Place 1 spray into both nostrils daily after supper. 3 mL 0  . lidocaine (LIDODERM) 5 % Place 1 patch onto the skin daily. Remove & Discard patch within 12 hours or as directed by MD 30  patch 0  . lisinopril (PRINIVIL,ZESTRIL) 20 MG tablet Take 1 tablet (20 mg total) by mouth daily. 30 tablet 0  . meloxicam (MOBIC) 7.5 MG tablet Take 1 tablet (7.5 mg total) by mouth 2 (two) times daily. 60 tablet 0  . metFORMIN (GLUCOPHAGE) 1000 MG tablet Take 1 tablet (1,000 mg total) by mouth 2 (two) times daily with a meal. 60 tablet 0  . montelukast (SINGULAIR) 10 MG tablet Take 1 tablet (10 mg total) by mouth at bedtime. For Asthma 30 tablet 0  . polyethylene glycol (MIRALAX / GLYCOLAX) packet Take 17 g by mouth daily. 30 each 0  .  propranolol (INDERAL) 20 MG tablet Take 1 tablet (20 mg total) by mouth 2 (two) times daily. 60 tablet 0  . simvastatin (ZOCOR) 40 MG tablet Take 1 tablet (40 mg total) by mouth at bedtime. For high cholesterol 15 tablet 0    Musculoskeletal: Strength & Muscle Tone: within normal limits Gait & Station: normal Patient leans: [name]  Psychiatric Specialty Exam: Physical Exam  Nursing note and vitals reviewed. Constitutional: He appears well-developed and well-nourished.  HENT:  Head: Normocephalic and atraumatic.  Eyes: Conjunctivae are normal. Pupils are equal, round, and reactive to light.  Neck: Normal range of motion.  Cardiovascular: Regular rhythm and normal heart sounds.   Respiratory: Effort normal. No respiratory distress.  GI: Soft.  Musculoskeletal: Normal range of motion.  Neurological: He is alert.  Skin: Skin is warm and dry.  Psychiatric: His affect is blunt. His speech is delayed. He is slowed. Thought content is paranoid. Cognition and memory are impaired. He expresses impulsivity and inappropriate judgment. He expresses suicidal ideation.    Review of Systems  Constitutional: Negative.   HENT: Negative.   Eyes: Negative.   Respiratory: Negative.   Cardiovascular: Negative.   Gastrointestinal: Negative.   Musculoskeletal: Negative.   Skin: Negative.   Neurological: Negative.   Psychiatric/Behavioral: Positive for depression, hallucinations and suicidal ideas. Negative for memory loss and substance abuse. The patient is nervous/anxious. The patient does not have insomnia.     Blood pressure 124/72, pulse 84, temperature 98.6 F (37 C), temperature source Oral, resp. rate 16, height _0  (1.753 m), weight 77.1 kg (170 lb), SpO2 95 %.Body mass index is 25.1 kg/m.  General Appearance: Disheveled  Eye Contact:  Fair  Speech:  Slow  Volume:  Decreased  Mood:  Depressed  Affect:  Congruent  Thought Process:  Goal Directed  Orientation:  Full (Time, Place, and  Person)  Thought Content:  Illogical, Paranoid Ideation and Rumination  Suicidal Thoughts:  Yes.  with intent/plan  Homicidal Thoughts:  No  Memory:  Immediate;   Good Recent;   Fair Remote;   Fair  Judgement:  Fair  Insight:  Shallow  Psychomotor Activity:  Decreased  Concentration:  Concentration: Fair  Recall:  [employer] of Knowledge:  Fair  Language:  Fair  Akathisia:  No  Handed:  Right  AIMS (if indicated):     Assets:  Desire for Improvement Social Support  ADL's:  Intact  Cognition:  Impaired,  Mild  Sleep:        Treatment Plan Summary: Daily contact with patient to assess and evaluate symptoms and progress in treatment, Medication management and Plan 41 year old man with chronic psychotic disorder returns to the emergency room shortly after discharge once again reporting that his symptoms are overwhelming. Patient is continuing to endorse suicidal ideation and psychotic symptoms. I suspect there is part of this  that is psychogenic exaggerated although the patient also clearly has a psychotic disorder. At this point I think that it is probably the case that he has maximized the benefit that he is likely to obtain from hospitalization on our inpatient unit. I have talked with him about how ideally I think it would be good if he could make an effort to go back to the group home and try to make the best of it and see if his symptoms would come under control when he relaxes. Patient is not agreeable to that and continues to focus on his suicidal ideation and hallucinations. I'm going to continue his medicine the way it was when he left here last. We may need to talk with [facility] about an alternative plan and may need to discuss referral back to the state facility depending on how things go over the next couple days. Case reviewed with emergency room physician and TTS.  Disposition: Supportive therapy provided about ongoing stressors. Discussed crisis plan, support from  social network, calling 911, coming to the Emergency Department, and calling Suicide Hotline.  Alethia Berthold, MD 10/21/2016 2:18 PM

## 2016-10-21 NOTE — ED Notes (Addendum)
Pt to move to BHU - CBGs are elevated  Pt reports to me that  "i did not get any insulin the whole time I was admitted in [place]."

## 2016-10-22 DIAGNOSIS — F251 Schizoaffective disorder, depressive type: Secondary | ICD-10-CM | POA: Diagnosis not present

## 2016-10-22 LAB — GLUCOSE, CAPILLARY
GLUCOSE-CAPILLARY: 152 mg/dL — AB (ref 65–99)
GLUCOSE-CAPILLARY: 375 mg/dL — AB (ref 65–99)
GLUCOSE-CAPILLARY: 347 mg/dL — AB (ref 65–99)
Glucose-Capillary: 197 mg/dL — ABNORMAL HIGH (ref 65–99)
Glucose-Capillary: 299 mg/dL — ABNORMAL HIGH (ref 65–99)
Glucose-Capillary: 418 mg/dL — ABNORMAL HIGH (ref 65–99)

## 2016-10-22 MED ORDER — INSULIN ASPART 100 UNIT/ML ~~LOC~~ SOLN
6.0000 [IU] | Freq: Once | SUBCUTANEOUS | Status: AC
  Administered 2016-10-22: 6 [IU] via SUBCUTANEOUS
  Filled 2016-10-22: qty 6

## 2016-10-22 NOTE — ED Notes (Signed)
Hourly rounding reveals patient sleeping in room. No complaints, stable, in no acute distress. Q15 minute rounds and monitoring via Security Cameras to continue. 

## 2016-10-22 NOTE — ED Notes (Signed)
Patient up to bathroom.  Pt given po fluids per pt's request. Pt remains safe with 15 minute checks

## 2016-10-22 NOTE — ED Notes (Signed)
Pt ate most of lunch. Pt resting quietly in bed with eyes closed

## 2016-10-22 NOTE — ED Notes (Signed)
Made Dr. Scotty Court aware of blood sugar 347. His order is to do another blood sugar in AM.

## 2016-10-22 NOTE — ED Notes (Signed)
Pt ate all of breakfast and requests for light to be turned out so that he can sleep. Pt voices no complaints.

## 2016-10-22 NOTE — ED Notes (Signed)
Consulted with Dr. Scotty Court ref. Patients blood sugar / meds.

## 2016-10-22 NOTE — ED Notes (Signed)
PT IVC/ PENDING PLACEMENT  

## 2016-10-22 NOTE — ED Notes (Signed)
Report to include Situation, Background, Assessment, and Recommendations received from [facility]. Patient alert and oriented, warm and dry, in no acute distress. Patient denies SI, HI, AVH and pain. Patient made aware of Q15 minute rounds and security cameras for their safety. Patient instructed to come to me with needs or concerns.

## 2016-10-22 NOTE — ED Notes (Signed)
Patient Referred to [facility].    Referral information faxed to [facility] [phone]) and confirmed it was received.    Verbal Screening Completed with [facility] [phone]) Onalee Hua)  Hospital Denials: [facility]

## 2016-10-22 NOTE — ED Notes (Signed)
Lunch brought to pt.

## 2016-10-22 NOTE — ED Notes (Signed)
Breakfast brought to patient 

## 2016-10-22 NOTE — Consult Note (Signed)
[facility] Consult   Reason for Consult:  Consult for 41 year old man with a history of schizoaffective disorder or schizophrenia brought back from his group home reporting suicidal thoughts and psychosis Referring Physician:  Paduchowski Patient Identification: Tony Cunningham MRN:  [id number] Principal Diagnosis: Schizoaffective disorder, depressive type [facility]) Diagnosis:   Patient Active Problem List   Diagnosis Date Noted  . Schizoaffective disorder, depressive type ([place]) [F25.1] 09/23/2016  . Polysubstance abuse [F19.10] 07/17/2016  . Tardive dyskinesia [G24.01] 07/16/2016  . Tobacco use disorder [F17.200] 07/07/2016  . Dyslipidemia [E78.5] 07/07/2016  . Asthma [J45.909] 07/07/2016  . HTN (hypertension) [I10] 07/06/2016  . Diabetes ([place]) [E11.9] 12/25/2010    Total Time spent with patient: 20 minutes  Subjective:   Tony Cunningham is a 41 y.o. male patient admitted with "I got suicidal".  Follow-up for 41 year old man with schizophrenia. Patient remains in the emergency room. Continues to endorse suicidal ideation. He interacts very little with others. Affect flat and blunted. Not able to engage in much conversation or talk about how he could improve his situation. Patient wants to be in the hospital.  HPI:  Patient interviewed chart reviewed. Patient familiar to me from previous encounters. 41 year old man with chronic mental illness. He was discharged from the inpatient psychiatric unit at our hospital yesterday afternoon. He was to go to a group home. At the time of discharge it was documented clinically that the patient appeared to be doing well and was not reporting any psychosis or suicidality. And tells me that after he went to the group home last night he started to feel bad. He started to have auditory hallucinations which she says tell him to cut himself. He also started to feel like he was going to hurt himself and that he couldn't stand to stay there. He inform the  group home today and they brought him back over to the hospital. Patient is unclear about whether he actually received his medication any time since his discharge. He does not report however any specific stressor that he can clearly identify ref  Social history: Patient has no family that are able to take care of him now. He has been in family care for a long time but that is no longer possible. [facility] he has been to our hospital multiple times in the last month or so and it has proven very difficult to get him to stay in an outpatient setting.  Medical history: Patient has asthma hypertension and diabetes which particularly has been difficult to control in the past.  Substance abuse history: Smokes too much. Denies any recent alcohol or drug abuse.  Past Psychiatric History: [name] Has been on multiple antipsychotics. There has been some difficulty finding a medicine that he tolerates and also appears to benefit from. He's had multiple hospitalizations in the past. Has had some self injury and some hostility in the past. Patient's recent hospitalization at our facility involve some medication adjustment and it was clearly documented that his symptoms had improved and that he appeared to be doing well at discharge.  Risk to Self: Suicidal Ideation: Yes-Currently Present Suicidal Intent: Yes-Currently Present Is patient at risk for suicide?: Yes Suicidal Plan?: No-Not Currently/Within Last 6 Months Specify Current Suicidal Plan: None Reported Access to Means:  (C) Specify Access to Suicidal Means: UTA What has been your use of drugs/alcohol within the last 12 months?: Pt denies drug/alcohol use.  How many times?: 2 Other Self Harm Risks: UTA, None per chart Triggers for Past Attempts: Unknown Intentional Self Injurious Behavior: None Risk to Others: Homicidal Ideation:  ("I don't know") Thoughts  of Harm to Others:  (UTA) Current Homicidal Intent:  (UTA) Current Homicidal Plan:  (UTA) Access to Homicidal Means:  (UTA) History of harm to others?:  (UTA) Does patient have access to weapons?:  ([place]) Criminal Charges Pending?:  (UTA) Does patient have a court date:  (UTA) Prior Inpatient Therapy: Prior Inpatient Therapy: Yes Prior Therapy Dates: Multiple Prior Therapy Facilty/Provider(s): [facility] Reason for Treatment: Schizoaffective d/o Prior Outpatient Therapy: Prior Outpatient Therapy: Yes Prior Therapy Dates: [facility] Prior Therapy Facilty/Provider(s): [facility] Reason for Treatment: Schizoaffective d/o Does patient have an ACCT team?: No Does patient have Intensive In-House Services?  : No Does patient have [facility] services? : Unknown Does patient have P4CC services?: No  Past Medical History:  Past Medical History:  Diagnosis Date  . Anxiety   . Asthma   . Diabetes mellitus   . High blood pressure   . Sinus complaint    History reviewed. No pertinent surgical history. Family History: History reviewed. No pertinent family history. Family Psychiatric  History: Patient does not know of any clear family history Social History:  History  Alcohol Use No     History  Drug Use No    Social History   Social History  . Marital status: Single    Spouse name: [name]  . Number of children: [name]  . Years of education: [name]   Social History Main Topics  . Smoking status: Current Every Day Smoker    Packs/day: 0.50    Types: Cigarettes  . Smokeless tobacco: Never Used  . Alcohol use No  . Drug use: No  . Sexual activity: Not Currently   Other Topics Concern  . None   Social History Narrative  . None   Additional Social History:    Allergies:  No Known Allergies  Labs:  Results for orders placed or performed during the hospital encounter of 10/21/16 (from the past 48 hour(s))  Comprehensive metabolic panel     Status: Abnormal   Collection Time: 10/21/16 12:57 PM   Result Value Ref Range   Sodium 133 (L) 135 - 145 mmol/L   Potassium 4.0 3.5 - 5.1 mmol/L   Chloride 98 (L) 101 - 111 mmol/L   CO2 28 22 - 32 mmol/L   Glucose, Bld 319 (H) 65 - 99 mg/dL   BUN 9 6 - 20 mg/dL   Creatinine, Ser 0.69 0.61 - 1.24 mg/dL   Calcium 8.7 (L) 8.9 - 10.3 mg/dL   Total Protein 5.8 (L) 6.5 - 8.1 g/dL   Albumin 3.5 3.5 - 5.0 g/dL   AST 27 15 - 41 U/L   ALT 29 17 - 63 U/L   Alkaline Phosphatase 66 38 - 126 U/L   Total Bilirubin 0.6 0.3 - 1.2 mg/dL   GFR calc non Af Amer >60 >60 mL/min   GFR calc Af Amer >60 >60 mL/min    Comment: (NOTE) The eGFR has been calculated using the CKD EPI equation. This calculation has not been validated in all clinical situations. eGFR's persistently <60 mL/min signify possible Chronic Kidney Disease.    Anion gap 7 5 - 15  Ethanol     Status: None   Collection Time: 10/21/16 12:57 PM  Result Value Ref Range   Alcohol, Ethyl (B) <5 <5 mg/dL    Comment:  LOWEST DETECTABLE LIMIT FOR SERUM ALCOHOL IS 5 mg/dL FOR MEDICAL PURPOSES ONLY   Salicylate level     Status: None   Collection Time: 10/21/16 12:57 PM  Result Value Ref Range   Salicylate Lvl <5.0 2.8 - 30.0 mg/dL  Acetaminophen level     Status: Abnormal   Collection Time: 10/21/16 12:57 PM  Result Value Ref Range   Acetaminophen (Tylenol), Serum <10 (L) 10 - 30 ug/mL    Comment:        THERAPEUTIC CONCENTRATIONS VARY SIGNIFICANTLY. A RANGE OF 10-30 ug/mL MAY BE AN EFFECTIVE CONCENTRATION FOR MANY PATIENTS. HOWEVER, SOME ARE BEST TREATED AT CONCENTRATIONS OUTSIDE THIS RANGE. ACETAMINOPHEN CONCENTRATIONS >150 ug/mL AT 4 HOURS AFTER INGESTION AND >50 ug/mL AT 12 HOURS AFTER INGESTION ARE OFTEN ASSOCIATED WITH TOXIC REACTIONS.   cbc     Status: None   Collection Time: 10/21/16 12:57 PM  Result Value Ref Range   WBC 8.2 3.8 - 10.6 K/uL   RBC 4.70 4.40 - 5.90 MIL/uL   Hemoglobin 15.0 13.0 - 18.0 g/dL   HCT 42.2 40.0 - 52.0 %   MCV 89.8 80.0 - 100.0 fL    MCH 31.9 26.0 - 34.0 pg   MCHC 35.6 32.0 - 36.0 g/dL   RDW 12.8 11.5 - 14.5 %   Platelets 213 150 - 440 K/uL  Urine Drug Screen, Qualitative     Status: Abnormal   Collection Time: 10/21/16 12:57 PM  Result Value Ref Range   Tricyclic, Ur Screen NONE DETECTED NONE DETECTED   Amphetamines, Ur Screen NONE DETECTED NONE DETECTED   MDMA (Ecstasy)Ur Screen NONE DETECTED NONE DETECTED   Cocaine Metabolite,Ur [place] NONE DETECTED NONE DETECTED   Opiate, Ur Screen NONE DETECTED NONE DETECTED   Phencyclidine (PCP) Ur S NONE DETECTED NONE DETECTED   Cannabinoid 50 Ng, Ur [place] NONE DETECTED NONE DETECTED   Barbiturates, Ur Screen NONE DETECTED NONE DETECTED   Benzodiazepine, Ur Scrn POSITIVE (A) NONE DETECTED   Methadone Scn, Ur NONE DETECTED NONE DETECTED    Comment: (NOTE) 093  Tricyclics, urine               Cutoff 1000 ng/mL 200  Amphetamines, urine             Cutoff 1000 ng/mL 300  MDMA (Ecstasy), urine           Cutoff 500 ng/mL 400  Cocaine Metabolite, urine       Cutoff 300 ng/mL 500  Opiate, urine                   Cutoff 300 ng/mL 600  Phencyclidine (PCP), urine      Cutoff 25 ng/mL 700  Cannabinoid, urine              Cutoff 50 ng/mL 800  Barbiturates, urine             Cutoff 200 ng/mL 900  Benzodiazepine, urine           Cutoff 200 ng/mL 1000 Methadone, urine                Cutoff 300 ng/mL 1100 1200 The urine drug screen provides only a preliminary, unconfirmed 1300 analytical test result and should not be used for non-medical 1400 purposes. Clinical consideration and professional judgment should 1500 be applied to any positive drug screen result due to possible 1600 interfering substances. A more specific alternate chemical method 1700 must be used in order to obtain a confirmed  analytical result.  1800 Gas chromato graphy / mass spectrometry (GC/MS) is the preferred 1900 confirmatory method.   Glucose, capillary     Status: Abnormal   Collection Time: 10/21/16  4:29 PM   Result Value Ref Range   Glucose-Capillary 475 (H) 65 - 99 mg/dL  Glucose, capillary     Status: Abnormal   Collection Time: 10/21/16  6:12 PM  Result Value Ref Range   Glucose-Capillary 416 (H) 65 - 99 mg/dL  Glucose, capillary     Status: Abnormal   Collection Time: 10/21/16  8:14 PM  Result Value Ref Range   Glucose-Capillary 291 (H) 65 - 99 mg/dL  Glucose, capillary     Status: Abnormal   Collection Time: 10/22/16  7:39 AM  Result Value Ref Range   Glucose-Capillary 197 (H) 65 - 99 mg/dL  Glucose, capillary     Status: Abnormal   Collection Time: 10/22/16 11:59 AM  Result Value Ref Range   Glucose-Capillary 152 (H) 65 - 99 mg/dL    Current Facility-Administered Medications  Medication Dose Route Frequency Provider Last Rate Last Dose  . acetaminophen (TYLENOL) tablet 650 mg  650 mg Oral Q6H PRN Harvest Dark, MD   650 mg at 10/21/16 1343  . cloZAPine (CLOZARIL) tablet 100 mg  100 mg Oral QPC supper Park Beck, Madie Reno, MD   100 mg at 10/21/16 1646  . fluvoxaMINE (LUVOX) tablet 50 mg  50 mg Oral QPC supper Daveigh Batty, Madie Reno, MD   50 mg at 10/21/16 1646  . insulin aspart (novoLOG) injection 0-15 Units  0-15 Units Subcutaneous TID WC Mikaelah Trostle, Madie Reno, MD   3 Units at 10/22/16 1212  . insulin aspart (novoLOG) injection 5 Units  5 Units Subcutaneous TID WC Zora Glendenning, Madie Reno, MD   5 Units at 10/22/16 1215  . insulin glargine (LANTUS) injection 42 Units  42 Units Subcutaneous QHS Marquerite Forsman, Madie Reno, MD   42 Units at 10/21/16 2320  . lisinopril (PRINIVIL,ZESTRIL) tablet 20 mg  20 mg Oral Daily Breylin Dom T, MD   20 mg at 10/22/16 1023  . meloxicam (MOBIC) tablet 7.5 mg  7.5 mg Oral BID PC Maddux Vanscyoc T, MD   7.5 mg at 10/22/16 0809  . metFORMIN (GLUCOPHAGE) tablet 1,000 mg  1,000 mg Oral BID WC Naina Sleeper, Madie Reno, MD   1,000 mg at 10/22/16 0756  . montelukast (SINGULAIR) tablet 10 mg  10 mg Oral QHS Bear Osten, Madie Reno, MD   10 mg at 10/21/16 2320  . polyethylene glycol (MIRALAX / GLYCOLAX)  packet 17 g  17 g Oral Daily Namiah Dunnavant T, MD   17 g at 10/21/16 1642  . propranolol (INDERAL) tablet 20 mg  20 mg Oral BID Georgetta Crafton, Madie Reno, MD   20 mg at 10/22/16 1024  . simvastatin (ZOCOR) tablet 40 mg  40 mg Oral q1800 Melyna Huron, Madie Reno, MD       Current Outpatient Prescriptions  Medication Sig Dispense Refill  . cloZAPine (CLOZARIL) 100 MG tablet Take 1 tablet (100 mg total) by mouth daily. Daily at 1700 30 tablet 0  . fluvoxaMINE (LUVOX) 50 MG tablet Take 1 tablet (50 mg total) by mouth daily. Daily at 1700 30 tablet 0  . insulin aspart (NOVOLOG) 100 UNIT/ML injection Inject 5 Units into the skin 3 (three) times daily with meals. 5 mL 0  . insulin glargine (LANTUS) 100 UNIT/ML injection Inject 0.42 mLs (42 Units total) into the skin at bedtime. 13 mL 0  . ipratropium (  ATROVENT) 0.06 % nasal spray Place 1 spray into both nostrils daily after supper. 3 mL 0  . lisinopril (PRINIVIL,ZESTRIL) 20 MG tablet Take 1 tablet (20 mg total) by mouth daily. 30 tablet 0  . meloxicam (MOBIC) 7.5 MG tablet Take 1 tablet (7.5 mg total) by mouth 2 (two) times daily. 60 tablet 0  . polyethylene glycol (MIRALAX / GLYCOLAX) packet Take 17 g by mouth daily. 30 each 0  . propranolol (INDERAL) 20 MG tablet Take 1 tablet (20 mg total) by mouth 2 (two) times daily. 60 tablet 0  . cyclobenzaprine (FLEXERIL) 10 MG tablet Take 1 tablet (10 mg total) by mouth 3 (three) times daily as needed for muscle spasms. (Patient not taking: Reported on 10/21/2016) 60 tablet 0  . lidocaine (LIDODERM) 5 % Place 1 patch onto the skin daily. Remove & Discard patch within 12 hours or as directed by MD (Patient not taking: Reported on 10/21/2016) 30 patch 0  . metFORMIN (GLUCOPHAGE) 1000 MG tablet Take 1 tablet (1,000 mg total) by mouth 2 (two) times daily with a meal. (Patient not taking: Reported on 10/21/2016) 60 tablet 0  . montelukast (SINGULAIR) 10 MG tablet Take 1 tablet (10 mg total) by mouth at bedtime. For Asthma (Patient not  taking: Reported on 10/21/2016) 30 tablet 0  . simvastatin (ZOCOR) 40 MG tablet Take 1 tablet (40 mg total) by mouth at bedtime. For high cholesterol (Patient not taking: Reported on 10/21/2016) 15 tablet 0    Musculoskeletal: Strength & Muscle Tone: within normal limits Gait & Station: normal Patient leans: [name]  Psychiatric Specialty Exam: Physical Exam  Nursing note and vitals reviewed. Constitutional: He appears well-developed and well-nourished.  HENT:  Head: Normocephalic and atraumatic.  Eyes: Conjunctivae are normal. Pupils are equal, round, and reactive to light.  Neck: Normal range of motion.  Cardiovascular: Regular rhythm and normal heart sounds.   Respiratory: Effort normal. No respiratory distress.  GI: Soft.  Musculoskeletal: Normal range of motion.  Neurological: He is alert.  Skin: Skin is warm and dry.  Psychiatric: His affect is blunt. His speech is delayed. He is slowed. Thought content is paranoid. Cognition and memory are impaired. He expresses impulsivity and inappropriate judgment. He expresses suicidal ideation.    Review of Systems  Constitutional: Negative.   HENT: Negative.   Eyes: Negative.   Respiratory: Negative.   Cardiovascular: Negative.   Gastrointestinal: Negative.   Musculoskeletal: Negative.   Skin: Negative.   Neurological: Negative.   Psychiatric/Behavioral: Positive for depression, hallucinations and suicidal ideas. Negative for memory loss and substance abuse. The patient is nervous/anxious. The patient does not have insomnia.     Blood pressure 120/76, pulse 68, temperature 98.1 F (36.7 C), temperature source Oral, resp. rate 18, height '5\' 9"'  (1.753 m), weight 77.1 kg (170 lb), SpO2 98 %.Body mass index is 25.1 kg/m.  General Appearance: Disheveled  Eye Contact:  Fair  Speech:  Slow  Volume:  Decreased  Mood:  Depressed  Affect:  Congruent  Thought Process:  Goal Directed  Orientation:  Full (Time, Place, and Person)  Thought  Content:  Illogical, Paranoid Ideation and Rumination  Suicidal Thoughts:  Yes.  with intent/plan  Homicidal Thoughts:  No  Memory:  Immediate;   Good Recent;   Fair Remote;   Fair  Judgement:  Fair  Insight:  Shallow  Psychomotor Activity:  Decreased  Concentration:  Concentration: Fair  Recall:  [employer] of Knowledge:  Fair  Language:  Fair  Akathisia:  No  Handed:  Right  AIMS (if indicated):     Assets:  Desire for Improvement Social Support  ADL's:  Intact  Cognition:  Impaired,  Mild  Sleep:        Treatment Plan Summary: Daily contact with patient to assess and evaluate symptoms and progress in treatment, Medication management and Plan Patient was schizophrenia with recurrent behavior problems psychosis paranoia difficulty interacting with others. Got psychotic and depressive symptoms almost immediately after discharge. Patient insisting that he would be dangerous to himself or would come back to the hospital if sent back to his group home. Patient has had multiple trials with failure to function outside the hospital. At this point we are awaiting transfer to [facility] for longer term stabilization.  Disposition: Supportive therapy provided about ongoing stressors. Discussed crisis plan, support from social network, calling 911, coming to the Emergency Department, and calling Suicide Hotline.  Alethia Berthold, MD 10/22/2016 12:50 PM

## 2016-10-22 NOTE — ED Notes (Addendum)
Patient up to bathroom.Writer offered pt to take a shower but pt declined. Pt resting quietly in bed. Pt calm and cooperative -fairly withdrawn- for most of the day. Pt denies SI/HI and voices no complaints. Pt remains safe with 15 minute checks

## 2016-10-22 NOTE — ED Notes (Signed)
Dinner brought to patient 

## 2016-10-22 NOTE — ED Provider Notes (Signed)
-----------------------------------------   7:57 AM on 10/22/2016 -----------------------------------------   Blood pressure 108/63, pulse 65, temperature 98 F (36.7 C), temperature source Oral, resp. rate 20, height 5\' 9"  (1.753 m), weight 77.1 kg (170 lb), SpO2 98 %.  The patient had no acute events since last update.  Calm and cooperative at this time.  Disposition is pending Psychiatry/Behavioral Medicine team recommendations.     , MD 10/22/16 [phone]

## 2016-10-22 NOTE — ED Notes (Signed)
Pt easily aroused from sleep. Pt calm and cooperative and voices no complaints. Pt remains safe with 15 min checks

## 2016-10-22 NOTE — ED Notes (Signed)
Hourly rounding reveals patient in room. No complaints, stable, in no acute distress. Q15 minute rounds and monitoring via Security Cameras to continue. 

## 2016-10-23 DIAGNOSIS — F251 Schizoaffective disorder, depressive type: Secondary | ICD-10-CM | POA: Diagnosis not present

## 2016-10-23 LAB — GLUCOSE, CAPILLARY
GLUCOSE-CAPILLARY: 128 mg/dL — AB (ref 65–99)
GLUCOSE-CAPILLARY: 193 mg/dL — AB (ref 65–99)
Glucose-Capillary: 135 mg/dL — ABNORMAL HIGH (ref 65–99)
Glucose-Capillary: 122 mg/dL — ABNORMAL HIGH (ref 65–99)

## 2016-10-23 NOTE — ED Notes (Signed)
Pt has been in bed resting most of the day. Pt. Is medication compliant. Patient BS has remain stable this shift. Pt denies AH/VH/SI/HI/ and pain. No s/sx of acute distress. Patient verbalize feeling of hopeless and depression. Pt consumed 100% of all his meals. Patient remains on Q15 safety rounds. Will cont to monitor pt.

## 2016-10-23 NOTE — ED Notes (Signed)
Hourly rounding reveals patient sleeping in room. No complaints, stable, in no acute distress. Q15 minute rounds and monitoring via Security Cameras to continue. 

## 2016-10-23 NOTE — ED Notes (Signed)
Report to oncoming shift.

## 2016-10-23 NOTE — Progress Notes (Signed)
Inpatient Diabetes Program Recommendations  AACE/ADA: New Consensus Statement on Inpatient Glycemic Control (2015)  Target Ranges:  Prepandial:   less than 140 mg/dL      Peak postprandial:   less than 180 mg/dL (1-2 hours)      Critically ill patients:  140 - 180 mg/dL   Results for [name], [name] (MRN [id number]) as of 10/23/2016 10:20  Ref. Range 10/22/2016 07:39 10/22/2016 11:59 10/22/2016 16:36 10/22/2016 21:23 10/22/2016 21:29 10/22/2016 22:55 10/23/2016 07:57  Glucose-Capillary Latest Ref Range: 65 - 99 mg/dL 785 (H) 885 (H) 027 (H) 418 (H) 375 (H) 347 (H) 135 (H)   Review of Glycemic Control  Diabetes history: DM 2 Outpatient Diabetes medications: Lantus 42 units daily, Novolog 5 units tid with meals, Metformin 1000 mg bid Current orders for Inpatient glycemic control: Lantus 42 units QHS, Novolog Moderate 0-15 units tid + Novolog 5 units tid meal coverage  Inpatient Diabetes Program Recommendations:    Glucose trends still increased into the 300's with Novolog 5 meal coverage. Consider increasing meal coverage to Novolog 8 units tid if patient consumes at least 50% of meals.  Thanks,  Christena Deem RN, MSN, [facility] Inpatient Diabetes Coordinator Team Pager [phone] (8a-5p)

## 2016-10-23 NOTE — ED Provider Notes (Signed)
-----------------------------------------   6:32 AM on 10/23/2016 -----------------------------------------   Blood pressure 110/60, pulse 83, temperature 98.1 F (36.7 C), temperature source Oral, resp. rate 18, height 5\' 9"  (1.753 m), weight 77.1 kg (170 lb), SpO2 100 %.  The patient had no acute events since last update.  Calm and cooperative at this time.  Disposition is pending Psychiatry/Behavioral Medicine team recommendations.     , MD 10/23/16 [phone]

## 2016-10-24 DIAGNOSIS — F251 Schizoaffective disorder, depressive type: Secondary | ICD-10-CM | POA: Diagnosis not present

## 2016-10-24 LAB — GLUCOSE, CAPILLARY
GLUCOSE-CAPILLARY: 195 mg/dL — AB (ref 65–99)
Glucose-Capillary: 78 mg/dL (ref 65–99)
Glucose-Capillary: 153 mg/dL — ABNORMAL HIGH (ref 65–99)
Glucose-Capillary: 289 mg/dL — ABNORMAL HIGH (ref 65–99)

## 2016-10-24 NOTE — ED Notes (Signed)
Dinner brought to patient 

## 2016-10-24 NOTE — ED Notes (Signed)
Pt. Ivc/ pending placement

## 2016-10-24 NOTE — ED Notes (Signed)
PT IVC/ PENDING PLACEMENT  

## 2016-10-24 NOTE — ED Notes (Addendum)
Pt easily aroused from sleep for breakfast, but pt requesting to sleep in longer. Pt voicing no complaints, denies SI/HI and A/V hallucinations. Brought pt diet coke per pt's request. Pt remains safe with 15 minute checks

## 2016-10-24 NOTE — ED Notes (Addendum)
Pt in dayroom pacing and asking about bed availability. Pt states, "I feel like I'm going crazy in here."  Pt offered support and encouragement. Pt now resting quietly in bed with eyes closed

## 2016-10-24 NOTE — ED Notes (Signed)
Lunch brought to patient 

## 2016-10-24 NOTE — ED Notes (Signed)
Patient sleeping

## 2016-10-24 NOTE — ED Notes (Signed)
Pt awakened for VS.  Reports he slept well.  Denies s/i, h/i or hallucinations.  VSS Pt very malodorous.

## 2016-10-24 NOTE — ED Notes (Signed)
Pt slept in long naps and got up to the bathroom several times while monitored on 15 minute safety checks.

## 2016-10-24 NOTE — ED Notes (Signed)
Patient ate 100% dinner. Pt calm, cooperative and compliant with meds. Pt in dayroom talking to mother on phone. Pt denies SI/HI and A/V hallucinations at this time. Pt remains safe with 15 minute checks.

## 2016-10-24 NOTE — BH Assessment (Signed)
Writer called to confirmed  ([phone]) patient was on [facility] Wait List. Writer was informed they had no record of the patient. Writer told [facility], TTS spoke with Onalee Hua for the verbal screening on yesterday. [facility] was unable to locate patient's referral.   Patient referred to [facility], pending review.    State Referral Form and supporting documentation completed and faxed to [employer].   Received the Authorization/Tracking # [id number]) from [employer] ([id number]).   Verbal screening completed with [facility]([phone]), information faxed and confirmed it was received.

## 2016-10-24 NOTE — ED Notes (Signed)
Patient taking a shower.

## 2016-10-24 NOTE — ED Notes (Signed)
Brought pt graham crackers and pb per pt's request. Pt calm and cooperative with no behavior issues.

## 2016-10-25 DIAGNOSIS — F251 Schizoaffective disorder, depressive type: Secondary | ICD-10-CM | POA: Diagnosis not present

## 2016-10-25 LAB — GLUCOSE, CAPILLARY
GLUCOSE-CAPILLARY: 85 mg/dL (ref 65–99)
GLUCOSE-CAPILLARY: 336 mg/dL — AB (ref 65–99)
Glucose-Capillary: 82 mg/dL (ref 65–99)
Glucose-Capillary: 82 mg/dL (ref 65–99)
Glucose-Capillary: 313 mg/dL — ABNORMAL HIGH (ref 65–99)

## 2016-10-25 NOTE — ED Notes (Signed)
Pt received by previous shift.  Pt awaiting placement at [facility] via IVC.  Pt observed to be sleeping upon 1st contact.

## 2016-10-25 NOTE — ED Notes (Signed)
Pt. Is medication compliant. No s/sx of distress. Pt. Denies AH/VH/SI/and HI at this time. Pt denies pain at this time. Will cont to monitor pt.

## 2016-10-25 NOTE — BH Assessment (Signed)
Confirmed with [facility] ([phone]), patient remain on their Wait List and nothing else needed at this time.

## 2016-10-25 NOTE — ED Provider Notes (Signed)
-----------------------------------------   7:27 AM on 10/25/2016 -----------------------------------------   Blood pressure 128/88, pulse 74, temperature 98 F (36.7 C), temperature source Oral, resp. rate 16, height 5\' 9"  (1.753 m), weight 77.1 kg (170 lb), SpO2 99 %.  The patient had no acute events since last update.  Calm and cooperative at this time.  Disposition is pending Psychiatry/Behavioral Medicine team recommendations.     , MD 10/25/16 [phone]

## 2016-10-25 NOTE — ED Notes (Signed)
ED BHU PLACEMENT JUSTIFICATION Is the patient under IVC or is there intent for IVC: Yes.   Is the patient medically cleared: Yes.   Is there vacancy in the ED BHU: Yes.   Is the population mix appropriate for patient: Yes.   Is the patient awaiting placement in inpatient or outpatient setting: Yes.   Has the patient had a psychiatric consult: Yes.   Survey of unit performed for contraband, proper placement and condition of furniture, tampering with fixtures in bathroom, shower, and each patient room: Yes.  ; Findings: Environment clear and safe. APPEARANCE/BEHAVIOR calm and cooperative NEURO ASSESSMENT Orientation: time, place and person Hallucinations: No.None noted (Hallucinations) Speech: Normal Gait: normal RESPIRATORY ASSESSMENT Normal expansion.  Clear to auscultation.  No rales, rhonchi, or wheezing. CARDIOVASCULAR ASSESSMENT regular rate and rhythm, S1, S2 normal, no murmur, click, rub or gallop GASTROINTESTINAL ASSESSMENT soft, nontender, BS WNL, no r/g EXTREMITIES normal strength, tone, and muscle mass PLAN OF CARE Provide calm/safe environment. Vital signs assessed twice daily. ED BHU Assessment once each 12-hour shift. Collaborate with intake RN daily or as condition indicates. Assure the ED provider has rounded once each shift. Provide and encourage hygiene. Provide redirection as needed. Assess for escalating behavior; address immediately and inform ED provider.  Assess family dynamic and appropriateness for visitation as needed: No.; If necessary, describe findings: n/a Educate the patient/family about BHU procedures/visitation: Yes.  ; If necessary, describe findings: Pt given rules of visitation of unit.

## 2016-10-25 NOTE — ED Notes (Signed)
Pt encourage to shower. Pt showered, brushed teeth, and change sheets on bed. Will cont to monitor pt.

## 2016-10-25 NOTE — Progress Notes (Signed)
LCSW called No beds as per Chrissie Noa Patient is on wait list ( 39 people in front of him)   [employer] LCSW [phone]

## 2016-10-25 NOTE — ED Notes (Signed)
Per Dr. Derrill Kay hold 5 units of novolog. Pt bs is 82 at the present time.

## 2016-10-25 NOTE — ED Notes (Signed)
Pr provided with snack and fluids.

## 2016-10-25 NOTE — ED Notes (Signed)
Pt had showered earlier in the day but remains malodorous and is very messy in his room with dirty scrubs on floor and food on the floor.  Encouraged to clean up after himself but declines and does not do this.  Pt has poor memory of his med's and when he received them.  Asks if I gave him HS med's already.  Reminded this was yesterday.  Pt denies s/i, h/i or hallucinations.

## 2016-10-26 DIAGNOSIS — F251 Schizoaffective disorder, depressive type: Secondary | ICD-10-CM | POA: Diagnosis not present

## 2016-10-26 LAB — GLUCOSE, CAPILLARY
GLUCOSE-CAPILLARY: 174 mg/dL — AB (ref 65–99)
GLUCOSE-CAPILLARY: 160 mg/dL — AB (ref 65–99)
GLUCOSE-CAPILLARY: 104 mg/dL — AB (ref 65–99)
GLUCOSE-CAPILLARY: 149 mg/dL — AB (ref 65–99)

## 2016-10-26 MED ORDER — CLOZAPINE 25 MG PO TABS
125.0000 mg | ORAL_TABLET | Freq: Every day | ORAL | Status: DC
  Administered 2016-10-26 – 2016-11-05 (×11): 125 mg via ORAL
  Filled 2016-10-26 (×16): qty 5

## 2016-10-26 NOTE — ED Notes (Signed)
Pt has been in bed all day, only up to use the restroom. Pt is slow to respond to questions / directions, but does comply.  No concerns or needs at this time. Maintained on 15 minute checks and observation by security camera for safety.

## 2016-10-26 NOTE — ED Notes (Signed)
PT  IVC/  ON  [facility]  WAITLIST 

## 2016-10-26 NOTE — ED Notes (Signed)
Pt continues to lay in bed awake. Pt refused a shower. Given lunch tray. No needs or concerns at this time. Maintained on 15 minute checks and observation by security camera for safety.

## 2016-10-26 NOTE — ED Notes (Signed)
Pt given breakfast tray

## 2016-10-26 NOTE — BH Assessment (Signed)
[facility] called to f/u w/ pt referral. [facility] informed that pt remains in ED.

## 2016-10-26 NOTE — ED Provider Notes (Signed)
-----------------------------------------   8:16 AM on 10/26/2016 -----------------------------------------   Blood pressure 102/67, pulse 69, temperature 98 F (36.7 C), temperature source Oral, resp. rate 16, height 5\' 9"  (1.753 m), weight 77.1 kg (170 lb), SpO2 100 %.  The patient had no acute events since last update.  Patient is currently on the waiting list at [facility].   [facility], MD 10/26/16 [phone]

## 2016-10-26 NOTE — ED Notes (Signed)
Pt with flat, depressed affect this morning.  Pt confused, thinking his CBG had already been taken this morning.  RN explained that was last night and pt complied with medications. Pt denies SI/HI. Pt endorses AH. "Thank God they aren't as loud as they used to be."  No needs or concerns at this time. Maintained on 15 minute checks and observation by security camera for safety.

## 2016-10-26 NOTE — ED Notes (Signed)
Pt given HS meds after snack.  Pt often states "Didn't I already do that?"  Pt spends the shift sleeping in long naps.  Denies s/i, h/i or current hallucinations.  Monitored on 15 minute safety checks.

## 2016-10-26 NOTE — Consult Note (Signed)
[facility] Face-to-Face Psychiatry Consult   Reason for Consult:  Consult for 41 year old man with a history of schizoaffective disorder or schizophrenia brought back from his group home reporting suicidal thoughts and psychosis Referring Physician:  Paduchowski Patient Identification: Tony Cunningham MRN:  [id number] Principal Diagnosis: Schizoaffective disorder, depressive type [facility]) Diagnosis:   Patient Active Problem List   Diagnosis Date Noted  . Schizoaffective disorder, depressive type (HCC) [F25.1] 09/23/2016  . Polysubstance abuse [F19.10] 07/17/2016  . Tardive dyskinesia [G24.01] 07/16/2016  . Tobacco use disorder [F17.200] 07/07/2016  . Dyslipidemia [E78.5] 07/07/2016  . Asthma [J45.909] 07/07/2016  . HTN (hypertension) [I10] 07/06/2016  . Diabetes (HCC) [E11.9] 12/25/2010    Total Time spent with patient: 15 minutes  Subjective:   Tony Cunningham is a 41 y.o. male patient admitted with "I got suicidal".  [name], June 4 for this 41 year old man with schizophrenia who is still in the emergency room awaiting placement. Patient tells me today he is feeling even worse. He tells me that his tremors are worse although on inspection I don't see any sign of any particular tremor. He tells me when I ask him that the voices are still bad possibly worse. I pressed him for some more details about how he was feeling differently than he was before the weekend and he just got irritable saying that he hasn't been able to lay down for a long time because no one will let him. Patient seems withdrawn and disorganized in his thinking and somewhat paranoid. He has generally been reserved and cooperative in the emergency room  HPI:  Patient interviewed chart reviewed. Patient familiar to me from previous encounters. 41 year old man with chronic mental illness. He was discharged from the inpatient psychiatric unit at our hospital yesterday afternoon. He was to go to a group home. At the  time of discharge it was documented clinically that the patient appeared to be doing well and was not reporting any psychosis or suicidality. And tells me that after he went to the group home last night he started to feel bad. He started to have auditory hallucinations which she says tell him to cut himself. He also started to feel like he was going to hurt himself and that he couldn't stand to stay there. He inform the group home today and they brought him back over to the hospital. Patient is unclear about whether he actually received his medication any time since his discharge. He does not report however any specific stressor that he can clearly identify ref  Social history: Patient has no family that are able to take care of him now. He has been in family care for a long time but that is no longer possible. [facility] he has been to our hospital multiple times in the last month or so and it has proven very difficult to get him to stay in an outpatient setting.  Medical history: Patient has asthma hypertension and diabetes which particularly has been difficult to control in the past.  Substance abuse history: Smokes too much. Denies any recent alcohol or drug abuse.  Past Psychiatric History: [name] Has been on multiple antipsychotics. There has been some difficulty finding a medicine that he tolerates and also appears to benefit from. He's had multiple hospitalizations in the past. Has had some self injury and some hostility in the past. Patient's recent hospitalization at our facility  involve some medication adjustment and it was clearly documented that his symptoms had improved and that he appeared to be doing well at discharge.  Risk to Self: Suicidal Ideation: Yes-Currently Present Suicidal Intent: Yes-Currently Present Is patient at risk for suicide?: Yes Suicidal Plan?: No-Not Currently/Within Last  6 Months Specify Current Suicidal Plan: None Reported Access to Means:  (C) Specify Access to Suicidal Means: UTA What has been your use of drugs/alcohol within the last 12 months?: Pt denies drug/alcohol use. How many times?: 2 Other Self Harm Risks: UTA, None per chart Triggers for Past Attempts: Unknown Intentional Self Injurious Behavior: None Risk to Others: Homicidal Ideation:  ("I don't know") Thoughts of Harm to Others:  (UTA) Current Homicidal Intent:  (UTA) Current Homicidal Plan:  (UTA) Access to Homicidal Means:  (UTA) History of harm to others?:  (UTA) Does patient have access to weapons?:  (UTA) Criminal Charges Pending?:  (UTA) Does patient have a court date:  (UTA) Prior Inpatient Therapy: Prior Inpatient Therapy: Yes Prior Therapy Dates: Multiple Prior Therapy Facilty/Provider(s): [facility] Reason for Treatment: Schizoaffective d/o Prior Outpatient Therapy: Prior Outpatient Therapy: Yes Prior Therapy Dates: Unkwn Prior Therapy Facilty/Provider(s): Unkwn Reason for Treatment: Schizoaffective d/o Does patient have an ACCT team?: No Does patient have Intensive In-House Services?  : No Does patient have [facility] services? : Unknown Does patient have P4CC services?: No  Past Medical History:  Past Medical History:  Diagnosis Date  . Anxiety   . Asthma   . Diabetes mellitus   . High blood pressure   . Sinus complaint    History reviewed. No pertinent surgical history. Family History: History reviewed. No pertinent family history. Family Psychiatric  History: Patient does not know of any clear family history Social History:  History  Alcohol Use No     History  Drug Use No    Social History   Social History  . Marital status: Single    Spouse name: [name]  . Number of children: [name]  . Years of education: [name]   Social History Main Topics  . Smoking status: Current Every Day Smoker    Packs/day: 0.50    Types: Cigarettes  . Smokeless tobacco: Never Used  .  Alcohol use No  . Drug use: No  . Sexual activity: Not Currently   Other Topics Concern  . None   Social History Narrative  . None   Additional Social History:    Allergies:  No Known Allergies  Labs:  Results for orders placed or performed during the hospital encounter of 10/21/16 (from the past 48 hour(s))  Glucose, capillary     Status: Abnormal   Collection Time: 10/24/16  4:54 PM  Result Value Ref Range   Glucose-Capillary 195 (H) 65 - 99 mg/dL  Glucose, capillary     Status: Abnormal   Collection Time: 10/24/16  8:19 PM  Result Value Ref Range   Glucose-Capillary 289 (H) 65 - 99 mg/dL  Glucose, capillary     Status: None   Collection Time: 10/25/16  8:20 AM  Result Value Ref Range   Glucose-Capillary 85 65 - 99 mg/dL  Glucose, capillary     Status: None   Collection Time: 10/25/16 12:19 PM  Result Value Ref Range   Glucose-Capillary 82 65 - 99 mg/dL  Glucose, capillary     Status: None   Collection Time: 10/25/16  4:37 PM  Result Value Ref Range   Glucose-Capillary 82 65 - 99 mg/dL  Glucose, capillary  Status: Abnormal   Collection Time: 10/25/16  7:53 PM  Result Value Ref Range   Glucose-Capillary 336 (H) 65 - 99 mg/dL  Glucose, capillary     Status: Abnormal   Collection Time: 10/25/16  7:57 PM  Result Value Ref Range   Glucose-Capillary 313 (H) 65 - 99 mg/dL  Glucose, capillary     Status: Abnormal   Collection Time: 10/26/16  7:49 AM  Result Value Ref Range   Glucose-Capillary 174 (H) 65 - 99 mg/dL  Glucose, capillary     Status: Abnormal   Collection Time: 10/26/16 11:56 AM  Result Value Ref Range   Glucose-Capillary 160 (H) 65 - 99 mg/dL    Current Facility-Administered Medications  Medication Dose Route Frequency Provider Last Rate Last Dose  . acetaminophen (TYLENOL) tablet 650 mg  650 mg Oral Q6H PRN Minna Antis, MD   650 mg at 10/21/16 1343  . cloZAPine (CLOZARIL) tablet 100 mg  100 mg Oral QPC supper [name], [name], MD   100 mg at  10/25/16 1717  . fluvoxaMINE (LUVOX) tablet 50 mg  50 mg Oral QPC supper [name], [name], MD   50 mg at 10/25/16 1718  . insulin aspart (novoLOG) injection 0-15 Units  0-15 Units Subcutaneous TID WC [name], [name], MD   3 Units at 10/26/16 1203  . insulin aspart (novoLOG) injection 5 Units  5 Units Subcutaneous TID WC [name], [name], MD   5 Units at 10/26/16 1204  . insulin glargine (LANTUS) injection 42 Units  42 Units Subcutaneous QHS [name], [name], MD   42 Units at 10/25/16 2148  . lisinopril (PRINIVIL,ZESTRIL) tablet 20 mg  20 mg Oral Daily [name], [name], MD   20 mg at 10/26/16 1000  . meloxicam (MOBIC) tablet 7.5 mg  7.5 mg Oral BID PC [name], [name], MD   7.5 mg at 10/26/16 0757  . metFORMIN (GLUCOPHAGE) tablet 1,000 mg  1,000 mg Oral BID WC [name], [name], MD   1,000 mg at 10/26/16 0757  . montelukast (SINGULAIR) tablet 10 mg  10 mg Oral QHS [name], [name], MD   10 mg at 10/25/16 2148  . polyethylene glycol (MIRALAX / GLYCOLAX) packet 17 g  17 g Oral Daily [name], [name], MD   17 g at 10/24/16 0954  . propranolol (INDERAL) tablet 20 mg  20 mg Oral BID [name], [name], MD   20 mg at 10/26/16 0959  . simvastatin (ZOCOR) tablet 40 mg  40 mg Oral q1800 [name], [name], MD   40 mg at 10/25/16 1718   Current Outpatient Prescriptions  Medication Sig Dispense Refill  . cloZAPine (CLOZARIL) 100 MG tablet Take 1 tablet (100 mg total) by mouth daily. Daily at 1700 30 tablet 0  . fluvoxaMINE (LUVOX) 50 MG tablet Take 1 tablet (50 mg total) by mouth daily. Daily at 1700 30 tablet 0  . insulin aspart (NOVOLOG) 100 UNIT/ML injection Inject 5 Units into the skin 3 (three) times daily with meals. 5 mL 0  . insulin glargine (LANTUS) 100 UNIT/ML injection Inject 0.42 mLs (42 Units total) into the skin at bedtime. 13 mL 0  . ipratropium (ATROVENT) 0.06 % nasal spray Place 1 spray into both nostrils daily after supper. 3 mL 0  . lisinopril (PRINIVIL,ZESTRIL) 20 MG tablet Take 1 tablet (20 mg total)  by mouth daily. 30 tablet 0  . meloxicam (MOBIC) 7.5 MG tablet Take 1 tablet (7.5 mg total) by mouth 2 (two)  times daily. 60 tablet 0  . polyethylene glycol (MIRALAX / GLYCOLAX) packet Take 17 g by mouth daily. 30 each 0  . propranolol (INDERAL) 20 MG tablet Take 1 tablet (20 mg total) by mouth 2 (two) times daily. 60 tablet 0  . cyclobenzaprine (FLEXERIL) 10 MG tablet Take 1 tablet (10 mg total) by mouth 3 (three) times daily as needed for muscle spasms. (Patient not taking: Reported on 10/21/2016) 60 tablet 0  . lidocaine (LIDODERM) 5 % Place 1 patch onto the skin daily. Remove & Discard patch within 12 hours or as directed by MD (Patient not taking: Reported on 10/21/2016) 30 patch 0  . metFORMIN (GLUCOPHAGE) 1000 MG tablet Take 1 tablet (1,000 mg total) by mouth 2 (two) times daily with a meal. (Patient not taking: Reported on 10/21/2016) 60 tablet 0  . montelukast (SINGULAIR) 10 MG tablet Take 1 tablet (10 mg total) by mouth at bedtime. For Asthma (Patient not taking: Reported on 10/21/2016) 30 tablet 0  . simvastatin (ZOCOR) 40 MG tablet Take 1 tablet (40 mg total) by mouth at bedtime. For high cholesterol (Patient not taking: Reported on 10/21/2016) 15 tablet 0    Musculoskeletal: Strength & Muscle Tone: within normal limits Gait & Station: normal Patient leans: [name]  Psychiatric Specialty Exam: Physical Exam  Nursing note and vitals reviewed. Constitutional: He appears well-developed and well-nourished.  HENT:  Head: Normocephalic and atraumatic.  Eyes: Conjunctivae are normal. Pupils are equal, round, and reactive to light.  Neck: Normal range of motion.  Cardiovascular: Regular rhythm and normal heart sounds.   Respiratory: Effort normal. No respiratory distress.  GI: Soft.  Musculoskeletal: Normal range of motion.  Neurological: He is alert.  Skin: Skin is warm and dry.  Psychiatric: His affect is blunt. His speech is delayed. He is slowed. Thought content is paranoid. Cognition  and memory are impaired. He expresses impulsivity and inappropriate judgment. He expresses suicidal ideation.    Review of Systems  Constitutional: Negative.   HENT: Negative.   Eyes: Negative.   Respiratory: Negative.   Cardiovascular: Negative.   Gastrointestinal: Negative.   Musculoskeletal: Negative.   Skin: Negative.   Neurological: Negative.   Psychiatric/Behavioral: Positive for depression, hallucinations and suicidal ideas. Negative for memory loss and substance abuse. The patient is nervous/anxious. The patient does not have insomnia.     Blood pressure 109/70, pulse 76, temperature 98 F (36.7 C), temperature source Oral, resp. rate 16, height 5\' 9"  (1.753 m), weight 77.1 kg (170 lb), SpO2 99 %.Body mass index is 25.1 kg/m.  General Appearance: Disheveled  Eye Contact:  Fair  Speech:  Slow  Volume:  Decreased  Mood:  Depressed  Affect:  Congruent  Thought Process:  Goal Directed  Orientation:  Full (Time, Place, and Person)  Thought Content:  Illogical, Paranoid Ideation and Rumination  Suicidal Thoughts:  Yes.  with intent/plan  Homicidal Thoughts:  No  Memory:  Immediate;   Good Recent;   Fair Remote;   Fair  Judgement:  Fair  Insight:  Shallow  Psychomotor Activity:  Decreased  Concentration:  Concentration: Fair  Recall:  of Knowledge:  Fair  Language:  Fair  Akathisia:  No  Handed:  Right  AIMS (if indicated):     Assets:  Desire for Improvement Social Support  ADL's:  Intact  Cognition:  Impaired,  Mild  Sleep:        Treatment Plan Summary: Daily contact with patient to assess and evaluate symptoms and  progress in treatment, Medication management and Plan Patient was schizophrenia or schizoaffective depressed. If anything seems worse. Very withdrawn. He and active. Still does not feel like he can go back to the group home. We have been waiting for referral to [facility] which I believe is still the plan. We inspect medicine and try  increasing his clozapine. Encourage patient to come to [place] with any concerns and we will continue to reevaluate.  Disposition: Supportive therapy provided about ongoing stressors. Discussed crisis plan, support from social network, calling 911, coming to the Emergency Department, and calling Suicide Hotline.  Mordecai Rasmussen, MD 10/26/2016 12:08 PM

## 2016-10-26 NOTE — ED Notes (Signed)
Patient asleep in room. No noted distress or abnormal behavior. Will continue 15 minute checks and observation by security cameras for safety. 

## 2016-10-26 NOTE — ED Notes (Signed)
Pt came to nurses station asking for a drink. Pt sat in dayroom only a minute and returned to bed.  No further needs, no concerns at this time. Maintained on 15 minute checks and observation by security camera for safety.

## 2016-10-27 DIAGNOSIS — F251 Schizoaffective disorder, depressive type: Secondary | ICD-10-CM | POA: Diagnosis not present

## 2016-10-27 LAB — GLUCOSE, CAPILLARY
Glucose-Capillary: 158 mg/dL — ABNORMAL HIGH (ref 65–99)
Glucose-Capillary: 97 mg/dL (ref 65–99)
Glucose-Capillary: 141 mg/dL — ABNORMAL HIGH (ref 65–99)
Glucose-Capillary: 258 mg/dL — ABNORMAL HIGH (ref 65–99)

## 2016-10-27 MED ORDER — PROPRANOLOL HCL 10 MG PO TABS
ORAL_TABLET | ORAL | Status: AC
  Filled 2016-10-27: qty 2

## 2016-10-27 NOTE — ED Notes (Addendum)
Morning BS 158.  Pt. Refuses breakfast despite encouragement.  Request diet coke and 1 pack of graham crackers - given.  Dr. Alphonzo Lemmings in ED called to verify insulin dosing - ordered to give sliding scale coverage but hold 5 standing units.  Pt. Is calm and cooperative.

## 2016-10-27 NOTE — ED Notes (Signed)
PT IVC PENDING PLACEMENT/PAPERS TO BE RENEWED ON 06/06.

## 2016-10-27 NOTE — ED Provider Notes (Signed)
-----------------------------------------   7:55 AM on 10/27/2016 -----------------------------------------   Blood pressure 93/63, pulse 65, temperature 98.1 F (36.7 C), temperature source Oral, resp. rate 18, height 5\' 9"  (1.753 m), weight 77.1 kg (170 lb), SpO2 99 %.  The patient had no acute events since last update.  Calm and cooperative at this time.  Disposition is pending Psychiatry/Behavioral Medicine team recommendations.     , MD 10/27/16 [phone]

## 2016-10-27 NOTE — ED Notes (Signed)
Pt. Calm and cooperative but remains isolative to room for majority of day.  Accepted lunch and dinner and requested snacks in between.  Difficult to assess, but vaguely shakes head when asked about SI/HI/AVH.  Med-compliant.  No behavioral issues.  Asks if he's going home today.

## 2016-10-27 NOTE — BH Assessment (Signed)
Confirmed with [facility] Tony [phone]), patient remain on their Wait List and nothing else needed at this time.

## 2016-10-27 NOTE — ED Notes (Signed)
Pt is alert and oriented this evening. Pt mood is appropriate and he is pleasant and cooperating with staff. Pt sleeping most of the evening and is taking medications as prescribed. Pt denies SI/HI and AVH. 15 minute checks ongoing for safety.

## 2016-10-28 DIAGNOSIS — F251 Schizoaffective disorder, depressive type: Secondary | ICD-10-CM | POA: Diagnosis not present

## 2016-10-28 LAB — GLUCOSE, CAPILLARY
GLUCOSE-CAPILLARY: 83 mg/dL (ref 65–99)
GLUCOSE-CAPILLARY: 161 mg/dL — AB (ref 65–99)
GLUCOSE-CAPILLARY: 202 mg/dL — AB (ref 65–99)
GLUCOSE-CAPILLARY: 340 mg/dL — AB (ref 65–99)

## 2016-10-28 LAB — CBC WITH DIFFERENTIAL/PLATELET
BASOS ABS: 0.1 10*3/uL (ref 0–0.1)
Basophils Relative: 1 %
EOS PCT: 5 %
Eosinophils Absolute: 0.3 10*3/uL (ref 0–0.7)
HCT: 44 % (ref 40.0–52.0)
HEMOGLOBIN: 15.2 g/dL (ref 13.0–18.0)
LYMPHS PCT: 35 %
Lymphs Abs: 2.4 10*3/uL (ref 1.0–3.6)
MCH: 31.2 pg (ref 26.0–34.0)
MCHC: 34.6 g/dL (ref 32.0–36.0)
MCV: 90.1 fL (ref 80.0–100.0)
Monocytes Absolute: 0.4 10*3/uL (ref 0.2–1.0)
Monocytes Relative: 6 %
NEUTROS ABS: 3.8 10*3/uL (ref 1.4–6.5)
NEUTROS PCT: 53 %
PLATELETS: 193 10*3/uL (ref 150–440)
RBC: 4.89 MIL/uL (ref 4.40–5.90)
RDW: 12.8 % (ref 11.5–14.5)
WBC: 7.1 10*3/uL (ref 3.8–10.6)

## 2016-10-28 MED ORDER — HYDROXYZINE HCL 25 MG PO TABS
ORAL_TABLET | ORAL | Status: AC
  Filled 2016-10-28: qty 2

## 2016-10-28 MED ORDER — HYDROXYZINE HCL 25 MG PO TABS
50.0000 mg | ORAL_TABLET | Freq: Once | ORAL | Status: AC
Start: 2016-10-28 — End: 2016-10-28
  Administered 2016-10-28: 50 mg via ORAL

## 2016-10-28 NOTE — BH Assessment (Signed)
Confirmed with [facility](Nina W.[phone]), patientremainon their WaitList and nothing else needed at this time.

## 2016-10-28 NOTE — ED Notes (Signed)
Pt showered

## 2016-10-28 NOTE — ED Notes (Signed)
Pt denied SI, HI, and AVH. He contracts for safety. Pt says he is "trying to put himself together." Pt was given a diet ginger ale. Will continue to monitor for needs/safety.

## 2016-10-28 NOTE — ED Provider Notes (Signed)
-----------------------------------------   7:18 AM on 10/28/2016 -----------------------------------------   Blood pressure 93/60, pulse 71, temperature 98.1 F (36.7 C), temperature source Oral, resp. rate 18, height 5\' 9"  (1.753 m), weight 77.1 kg (170 lb), SpO2 100 %.  The patient had no acute events since last update.  Calm and cooperative at this time.  Disposition is pending Psychiatry/Behavioral Medicine team recommendations.     , MD 10/28/16 [phone]

## 2016-10-28 NOTE — ED Notes (Signed)
Pt given food tray and diet ginger ale. Pt is tremulous.

## 2016-10-28 NOTE — ED Notes (Signed)
Breakfast tray given. °

## 2016-10-28 NOTE — ED Notes (Signed)
Pt awake in room this evening but mildly confused. Pt urinating on floor earlier and needed to be redirected by staff. Writer moved pt to another room and EVS to clean. Pt repeatedly wanders back into old room and needs redirection. Writer gave instructions to use bathroom and pt acknowledges understanding. Pt denies SI/HI and AVH at this time. Pt not sleeping well tonight, but taking medications as prescribed. 15 minute checks ongoing for safety.

## 2016-10-28 NOTE — ED Notes (Signed)
Pt has been to the toilet a few times.

## 2016-10-28 NOTE — Progress Notes (Signed)
Pt has been calm and cooperative, though somewhat disorganized in conversation and slightly confused. At one point, he stood in front of the bathroom and, although the door was open, was reluctant to go in because he said someone was inside. Pt did go inside to use toilet when directed. Pt has taken all scheduled medications. He was encouraged to take a shower but has not done so. Odor is present in pt's room. Will continue to monitor for needs/safety.

## 2016-10-29 DIAGNOSIS — F251 Schizoaffective disorder, depressive type: Secondary | ICD-10-CM | POA: Diagnosis not present

## 2016-10-29 LAB — GLUCOSE, CAPILLARY
GLUCOSE-CAPILLARY: 213 mg/dL — AB (ref 65–99)
GLUCOSE-CAPILLARY: 118 mg/dL — AB (ref 65–99)
GLUCOSE-CAPILLARY: 323 mg/dL — AB (ref 65–99)
Glucose-Capillary: 293 mg/dL — ABNORMAL HIGH (ref 65–99)

## 2016-10-29 NOTE — ED Notes (Signed)
Patient has to be redirected multiple times on not going into other rooms. Patient has been going into other rooms and laying down. Advised patient not to go into room that the doors are closed to.

## 2016-10-29 NOTE — ED Notes (Signed)
Pt. Alert and oriented, warm and dry, in no distress. Pt. Denies SI, HI, and VH. Pt states hearing mumbled voices. Pt. Encouraged to let nursing staff know of any concerns or needs.

## 2016-10-29 NOTE — ED Notes (Addendum)
Patient up wandering unit multiple times throughout the night.  Patient confused and disoriented.  Patient speech is disorganized.  Patient had multiple episodes in which he entered other rooms on the unit and had to be redirected back to his room.  Patient displaying paranoia by stating, "I keep feeling like they are behind me.  Do you see them behind me?"  Writer reassured patient that no one was behind him.  Patient open to reorientation.  Patient has constant tremor.

## 2016-10-29 NOTE — ED Notes (Signed)
Dinner brought to patient 

## 2016-10-29 NOTE — ED Notes (Signed)
Moved pt to Rm 4 due to complaints of being cold in Rm 3.

## 2016-10-29 NOTE — Progress Notes (Signed)
Inpatient Diabetes Program Recommendations  AACE/ADA: New Consensus Statement on Inpatient Glycemic Control (2015)  Target Ranges:  Prepandial:   less than 140 mg/dL      Peak postprandial:   less than 180 mg/dL (1-2 hours)      Critically ill patients:  140 - 180 mg/dL   Lab Results  Component Value Date   GLUCAP 118 (H) 10/29/2016   HGBA1C 11.2 (H) 10/09/2016    Review of Glycemic Control  Results for HARSHIL, CAVALLARO (MRN [id number]) as of 10/29/2016 12:18  Ref. Range 10/28/2016 12:16 10/28/2016 16:48 10/28/2016 22:26 10/29/2016 07:30 10/29/2016 11:41  Glucose-Capillary Latest Ref Range: 65 - 99 mg/dL 700 (H) 174 (H) 944 (H) 213 (H) 118 (H)    Diabetes history:Type 2 Outpatient Diabetes medications: Lantus 39units qhs, Metformin 1000mg  bid, Novolog 7 units tid  Current orders for Inpatient glycemic control: Lantus 42units qhs, Metformin 1000mg  bid, Novolog 5 units tid, Novolog 0-15 units tid  Inpatient Diabetes Program Recommendations: Agree with current medications for blood sugar management.   , RN, BA, MHA, CDE Diabetes Coordinator Inpatient Diabetes Program  [phone] (Team Pager) [phone] [facility] Office) 10/29/2016 12:21 PM

## 2016-10-29 NOTE — ED Notes (Signed)
Lunch brought to patient 

## 2016-10-29 NOTE — ED Notes (Signed)
Patient requesting to take a shower. Pt given new scrubs, underwear, socks, towels and toiletries for shower. Pt now in shower.

## 2016-10-29 NOTE — ED Notes (Signed)

## 2016-10-29 NOTE — ED Notes (Addendum)
Pt resting quietly in bed with eyes closed upon initial approach. When pt asked for the time, pt seemed very confused when he was told it was the morning. Pt voices no complaints other than not sleeping well. Pt denies SI/HI, but endorses feeling of hopelessness "at times". Pt reports that his anxiety has decreased since being first admitted.

## 2016-10-29 NOTE — Progress Notes (Signed)
MEDICATION RELATED CONSULT NOTE - INITIAL   Pharmacy Consult for Clozapine Indication: Schizophrenia  No Known Allergies  Patient Measurements: Height: 5\' 9"  (175.3 cm) Weight: 170 lb (77.1 kg) IBW/kg (Calculated) : 70.7   Vital Signs: Temp: 98.1 F (36.7 C) (06/07 0949) Temp Source: Oral (06/07 0949) BP: 117/83 (06/07 0949) Pulse Rate: 83 (06/07 0949) Intake/Output from previous day: No intake/output data recorded. Intake/Output from this shift: No intake/output data recorded.  Labs:  Recent Labs  10/28/16 2243  WBC 7.1  HGB 15.2  HCT 44.0  PLT 193   Estimated Creatinine Clearance: 120.3 mL/min (by C-G formula based on SCr of 0.69 mg/dL).   Microbiology: No results found for this or any previous visit (from the past 720 hour(s)).  Medical History: Past Medical History:  Diagnosis Date  . Anxiety   . Asthma   . Diabetes mellitus   . High blood pressure   . Sinus complaint     Medications:  Scheduled:  . cloZAPine  125 mg Oral QPC supper  . fluvoxaMINE  50 mg Oral QPC supper  . insulin aspart  0-15 Units Subcutaneous TID WC  . insulin aspart  5 Units Subcutaneous TID WC  . insulin glargine  42 Units Subcutaneous QHS  . lisinopril  20 mg Oral Daily  . meloxicam  7.5 mg Oral BID PC  . metFORMIN  1,000 mg Oral BID WC  . montelukast  10 mg Oral QHS  . polyethylene glycol  17 g Oral Daily  . propranolol  20 mg Oral BID  . simvastatin  40 mg Oral q1800     Plan:  Clozapine eligible for dispensing per [facility] site. Dr. 12/28/16  Mordecai Rasmussen Zip [phone] Initial ANC 2700 on 5/29.    Next labs due 11/04/2016  ANC 5/29  2700 ANC 6/6  3800  Jayelle Page K, RPh 10/29/2016,10:38 AM

## 2016-10-29 NOTE — ED Notes (Signed)
Patient referred to [facility], pending review.    Discussed patient with Dr. Toni Amend. Pts plan of care has been reviewed and pt has been reevaluated for admission to [facility]. It has been determined that the pt  will not likely benefit from short term readmission. Pt is overly stimulated by human interaction and will not likely thrive in a less restrictive environment. Pt will remain on [facility] wait list for now.    Verified with [facility] that the pt ([phone]), information received from Brett Canales who confirms that that the pt is indeed on the wait list and that  [facility] will contact use the Intake department in additional information is needed

## 2016-10-29 NOTE — ED Notes (Signed)
Pt up to bathroom and back to bed.

## 2016-10-29 NOTE — ED Notes (Signed)
ED BHU PLACEMENT JUSTIFICATION Is the patient under IVC or is there intent for IVC: Yes.   Is the patient medically cleared: Yes.   Is there vacancy in the ED BHU: Yes.   Is the population mix appropriate for patient: Yes.   Is the patient awaiting placement in inpatient or outpatient setting: Yes.   Has the patient had a psychiatric consult: Yes.   Survey of unit performed for contraband, proper placement and condition of furniture, tampering with fixtures in bathroom, shower, and each patient room: Yes.  ; Findings: NA APPEARANCE/BEHAVIOR calm, cooperative and adequate rapport can be established NEURO ASSESSMENT Orientation: time, place and person Hallucinations: Yes.  Auditory Hallucinations Speech: Normal Gait: normal RESPIRATORY ASSESSMENT Normal expansion.  Clear to auscultation.  No rales, rhonchi, or wheezing. CARDIOVASCULAR ASSESSMENT regular rate and rhythm, S1, S2 normal, no murmur, click, rub or gallop GASTROINTESTINAL ASSESSMENT soft, nontender, BS WNL, no r/g EXTREMITIES normal strength, tone, and muscle mass PLAN OF CARE Provide calm/safe environment. Vital signs assessed twice daily. ED BHU Assessment once each 12-hour shift. Collaborate with intake RN daily or as condition indicates. Assure the ED provider has rounded once each shift. Provide and encourage hygiene. Provide redirection as needed. Assess for escalating behavior; address immediately and inform ED provider.  Assess family dynamic and appropriateness for visitation as needed: Yes.  ; If necessary, describe findings: NA Educate the patient/family about BHU procedures/visitation: Yes.  ; If necessary, describe findings: NA  

## 2016-10-29 NOTE — ED Notes (Signed)
Patient ate 100% of lunch 

## 2016-10-29 NOTE — ED Notes (Signed)
Patient moved to BHU 8 from BHU 4.

## 2016-10-29 NOTE — ED Notes (Signed)
Patient sitting up in bed in his room watching cartoons. Pt asked writer, "Am I going to be okay?", while pointing to his head. Pt reports being concerned about his mental health- seeing others come and go, and he is still here. Pt reassured that he is just waiting on bed availability and that he is on a wait list. Pt reports feeling relieved by this information.

## 2016-10-29 NOTE — ED Notes (Addendum)
Patient ate 100% of dinner

## 2016-10-29 NOTE — ED Notes (Signed)
Pt ate 100% of breakfast.

## 2016-10-29 NOTE — ED Notes (Signed)
Breakfast brought to patient 

## 2016-10-29 NOTE — ED Notes (Signed)
Patient found wandering into room 5- pt easily redirected to his room. Pt reports getting turned around- not recognizing his room. Writer placed sticky note at pt's doorway to remind pt which room is his. Pt given diet soda and graham crackers per pt's request. Pt resting quietly on bed.

## 2016-10-29 NOTE — ED Provider Notes (Signed)
-----------------------------------------   7:22 AM on 10/29/2016 -----------------------------------------   Blood pressure 105/72, pulse 90, temperature 98.2 F (36.8 C), temperature source Oral, resp. rate 20, height 1.753 m (5\' 9" ), weight 77.1 kg (170 lb), SpO2 100 %.  The patient had no acute events since last update.  Calm and cooperative at this time.  Per [facility], patient is on [facility] waitlist.   [facility], MD 10/29/16 [phone]

## 2016-10-29 NOTE — ED Notes (Signed)
Patient was redirected by security when patient tried to go into other patient's room.  RN supervisor made aware.

## 2016-10-30 DIAGNOSIS — F251 Schizoaffective disorder, depressive type: Secondary | ICD-10-CM | POA: Diagnosis not present

## 2016-10-30 LAB — GLUCOSE, CAPILLARY
GLUCOSE-CAPILLARY: 202 mg/dL — AB (ref 65–99)
GLUCOSE-CAPILLARY: 173 mg/dL — AB (ref 65–99)
GLUCOSE-CAPILLARY: 324 mg/dL — AB (ref 65–99)
Glucose-Capillary: 266 mg/dL — ABNORMAL HIGH (ref 65–99)

## 2016-10-30 NOTE — ED Notes (Signed)
ED BHU PLACEMENT JUSTIFICATION Is the patient under IVC or is there intent for IVC: Yes.   Is the patient medically cleared: Yes.   Is there vacancy in the ED BHU: Yes.   Is the population mix appropriate for patient: Yes.   Is the patient awaiting placement in inpatient or outpatient setting: Yes.   Has the patient had a psychiatric consult: Yes.   Survey of unit performed for contraband, proper placement and condition of furniture, tampering with fixtures in bathroom, shower, and each patient room: Yes.  ; Findings: NA APPEARANCE/BEHAVIOR calm, cooperative and adequate rapport can be established NEURO ASSESSMENT Orientation: time, place and person Hallucinations: Yes.  Auditory Hallucinations Speech: Normal Gait: normal RESPIRATORY ASSESSMENT Normal expansion.  Clear to auscultation.  No rales, rhonchi, or wheezing. CARDIOVASCULAR ASSESSMENT regular rate and rhythm, S1, S2 normal, no murmur, click, rub or gallop GASTROINTESTINAL ASSESSMENT soft, nontender, BS WNL, no r/g EXTREMITIES normal strength, tone, and muscle mass PLAN OF CARE Provide calm/safe environment. Vital signs assessed twice daily. ED BHU Assessment once each 12-hour shift. Collaborate with intake RN daily or as condition indicates. Assure the ED provider has rounded once each shift. Provide and encourage hygiene. Provide redirection as needed. Assess for escalating behavior; address immediately and inform ED provider.  Assess family dynamic and appropriateness for visitation as needed: Yes.  ; If necessary, describe findings: NA Educate the patient/family about BHU procedures/visitation: Yes.  ; If necessary, describe findings: NA  

## 2016-10-30 NOTE — Consult Note (Signed)
[facility] Face-to-Face Psychiatry Consult   Reason for Consult:  Consult for 41 year old man with a history of schizoaffective disorder or schizophrenia brought back from his group home reporting suicidal thoughts and psychosis Referring Physician:  Paduchowski Patient Identification: Tony Cunningham MRN:  [id number] Principal Diagnosis: Schizoaffective disorder, depressive type [facility]) Diagnosis:   Patient Active Problem List   Diagnosis Date Noted  . Schizoaffective disorder, depressive type (HCC) [F25.1] 09/23/2016  . Polysubstance abuse [F19.10] 07/17/2016  . Tardive dyskinesia [G24.01] 07/16/2016  . Tobacco use disorder [F17.200] 07/07/2016  . Dyslipidemia [E78.5] 07/07/2016  . Asthma [J45.909] 07/07/2016  . HTN (hypertension) [I10] 07/06/2016  . Diabetes (HCC) [E11.9] 12/25/2010    Total Time spent with patient: 15 minutes  Subjective:   Tony Cunningham is a 41 y.o. male patient admitted with "I got suicidal".  [name], June 4 for this 41 year old man with schizophrenia who is still in the emergency room awaiting placement. Patient tells me today he is feeling even worse. He tells me that his tremors are worse although on inspection I don't see any sign of any particular tremor. He tells me when I ask him that the voices are still bad possibly worse. I pressed him for some more details about how he was feeling differently than he was before the weekend and he just got irritable saying that he hasn't been able to lay down for a long time because no one will let him. Patient seems withdrawn and disorganized in his thinking and somewhat paranoid. He has generally been reserved and cooperative in the emergency room  Follow-up 41 year old man with schizophrenia and depressive symptoms. He still reports being very anxious having auditory hallucinations being frightened and worrying about being suicidal. He states withdrawn for the most part but says he is slightly less nervous  and upset than he was earlier. We have still kept him in the emergency room with the ideal hope of long-term hospitalization if he does not improve soon. We will continue to reassess to see if there is any appropriate change to treatment plan.  HPI:  Patient interviewed chart reviewed. Patient familiar to me from previous encounters. 41 year old man with chronic mental illness. He was discharged from the inpatient psychiatric unit at our hospital yesterday afternoon. He was to go to a group home. At the time of discharge it was documented clinically that the patient appeared to be doing well and was not reporting any psychosis or suicidality. And tells me that after he went to the group home last night he started to feel bad. He started to have auditory hallucinations which she says tell him to cut himself. He also started to feel like he was going to hurt himself and that he couldn't stand to stay there. He inform the group home today and they brought him back over to the hospital. Patient is unclear about whether he actually received his medication any time since his discharge. He does not report however any specific stressor that he can clearly identify ref  Social history: Patient has no family that are able to take care of him now. He has been in family care for a long time but that is no longer possible. [facility] he has been to our hospital multiple times in the last month or so and it has proven very difficult to get him to stay in an outpatient setting.  Medical history: Patient has asthma hypertension and diabetes  which particularly has been difficult to control in the past.  Substance abuse history: Smokes too much. Denies any recent alcohol or drug abuse.  Past Psychiatric History: [name] Has been on multiple antipsychotics. There has been some difficulty finding a medicine that he tolerates and  also appears to benefit from. He's had multiple hospitalizations in the past. Has had some self injury and some hostility in the past. Patient's recent hospitalization at our facility involve some medication adjustment and it was clearly documented that his symptoms had improved and that he appeared to be doing well at discharge.  Risk to Self: Suicidal Ideation: Yes-Currently Present Suicidal Intent: Yes-Currently Present Is patient at risk for suicide?: Yes Suicidal Plan?: No-Not Currently/Within Last 6 Months Specify Current Suicidal Plan: None Reported Access to Means:  (C) Specify Access to Suicidal Means: UTA What has been your use of drugs/alcohol within the last 12 months?: Pt denies drug/alcohol use. How many times?: 2 Other Self Harm Risks: UTA, None per chart Triggers for Past Attempts: Unknown Intentional Self Injurious Behavior: None Risk to Others: Homicidal Ideation:  ("I don't know") Thoughts of Harm to Others:  (UTA) Current Homicidal Intent:  (UTA) Current Homicidal Plan:  (UTA) Access to Homicidal Means:  (UTA) History of harm to others?:  (UTA) Does patient have access to weapons?:  (UTA) Criminal Charges Pending?:  (UTA) Does patient have a court date:  (UTA) Prior Inpatient Therapy: Prior Inpatient Therapy: Yes Prior Therapy Dates: Multiple Prior Therapy Facilty/Provider(s): [facility] Reason for Treatment: Schizoaffective d/o Prior Outpatient Therapy: Prior Outpatient Therapy: Yes Prior Therapy Dates: Unkwn Prior Therapy Facilty/Provider(s): Unkwn Reason for Treatment: Schizoaffective d/o Does patient have an ACCT team?: No Does patient have Intensive In-House Services?  : No Does patient have [facility] services? : Unknown Does patient have P4CC services?: No  Past Medical History:  Past Medical History:  Diagnosis Date  . Anxiety   . Asthma   . Diabetes mellitus   . High blood pressure   . Sinus complaint    History reviewed. No pertinent surgical  history. Family History: History reviewed. No pertinent family history. Family Psychiatric  History: Patient does not know of any clear family history Social History:  History  Alcohol Use No     History  Drug Use No    Social History   Social History  . Marital status: Single    Spouse name: [name]  . Number of children: [name]  . Years of education: [name]   Social History Main Topics  . Smoking status: Current Every Day Smoker    Packs/day: 0.50    Types: Cigarettes  . Smokeless tobacco: Never Used  . Alcohol use No  . Drug use: No  . Sexual activity: Not Currently   Other Topics Concern  . None   Social History Narrative  . None   Additional Social History:    Allergies:  No Known Allergies  Labs:  Results for orders placed or performed during the hospital encounter of 10/21/16 (from the past 48 hour(s))  Glucose, capillary     Status: Abnormal   Collection Time: 10/28/16 10:26 PM  Result Value Ref Range   Glucose-Capillary 340 (H) 65 - 99 mg/dL  CBC with Differential/Platelet     Status: None   Collection Time: 10/28/16 10:43 PM  Result Value Ref Range   WBC 7.1 3.8 - 10.6 K/uL   RBC 4.89 4.40 - 5.90 MIL/uL   Hemoglobin 15.2 13.0 -  18.0 g/dL   HCT 16.1 09.6 - 04.5 %   MCV 90.1 80.0 - 100.0 fL   MCH 31.2 26.0 - 34.0 pg   MCHC 34.6 32.0 - 36.0 g/dL   RDW 40.9 81.1 - 91.4 %   Platelets 193 150 - 440 K/uL   Neutrophils Relative % 53 %   Neutro Abs 3.8 1.4 - 6.5 K/uL   Lymphocytes Relative 35 %   Lymphs Abs 2.4 1.0 - 3.6 K/uL   Monocytes Relative 6 %   Monocytes Absolute 0.4 0.2 - 1.0 K/uL   Eosinophils Relative 5 %   Eosinophils Absolute 0.3 0 - 0.7 K/uL   Basophils Relative 1 %   Basophils Absolute 0.1 0 - 0.1 K/uL  Glucose, capillary     Status: Abnormal   Collection Time: 10/29/16  7:30 AM  Result Value Ref Range   Glucose-Capillary 213 (H) 65 - 99 mg/dL  Glucose, capillary     Status: Abnormal   Collection Time: 10/29/16 11:41 AM  Result Value Ref  Range   Glucose-Capillary 118 (H) 65 - 99 mg/dL  Glucose, capillary     Status: Abnormal   Collection Time: 10/29/16  4:34 PM  Result Value Ref Range   Glucose-Capillary 293 (H) 65 - 99 mg/dL  Glucose, capillary     Status: Abnormal   Collection Time: 10/29/16  8:31 PM  Result Value Ref Range   Glucose-Capillary 323 (H) 65 - 99 mg/dL  Glucose, capillary     Status: Abnormal   Collection Time: 10/30/16  8:30 AM  Result Value Ref Range   Glucose-Capillary 202 (H) 65 - 99 mg/dL  Glucose, capillary     Status: Abnormal   Collection Time: 10/30/16 12:33 PM  Result Value Ref Range   Glucose-Capillary 173 (H) 65 - 99 mg/dL  Glucose, capillary     Status: Abnormal   Collection Time: 10/30/16  4:48 PM  Result Value Ref Range   Glucose-Capillary 266 (H) 65 - 99 mg/dL    Current Facility-Administered Medications  Medication Dose Route Frequency Provider Last Rate Last Dose  . acetaminophen (TYLENOL) tablet 650 mg  650 mg Oral Q6H PRN Minna Antis, MD   650 mg at 10/21/16 1343  . cloZAPine (CLOZARIL) tablet 125 mg  125 mg Oral QPC supper [name], [name], MD   125 mg at 10/30/16 1716  . fluvoxaMINE (LUVOX) tablet 50 mg  50 mg Oral QPC supper [name], [name], MD   50 mg at 10/30/16 1715  . insulin aspart (novoLOG) injection 0-15 Units  0-15 Units Subcutaneous TID WC [name], [name], MD   8 Units at 10/30/16 1700  . insulin aspart (novoLOG) injection 5 Units  5 Units Subcutaneous TID WC [name], [name], MD   5 Units at 10/30/16 1703  . insulin glargine (LANTUS) injection 42 Units  42 Units Subcutaneous QHS [name], [name], MD   42 Units at 10/29/16 2157  . lisinopril (PRINIVIL,ZESTRIL) tablet 20 mg  20 mg Oral Daily [name], [name], MD   20 mg at 10/30/16 0909  . meloxicam (MOBIC) tablet 7.5 mg  7.5 mg Oral BID PC [name], [name], MD   7.5 mg at 10/30/16 1715  . metFORMIN (GLUCOPHAGE) tablet 1,000 mg  1,000 mg Oral BID WC [name], [name], MD   1,000 mg at 10/30/16 1651  . montelukast  (SINGULAIR) tablet 10 mg  10 mg Oral QHS [name], [name], MD   10 mg at 10/29/16 2157  .  polyethylene glycol (MIRALAX / GLYCOLAX) packet 17 g  17 g Oral Daily [name], [name], MD   17 g at 10/24/16 0954  . propranolol (INDERAL) tablet 20 mg  20 mg Oral BID [name], [name], MD   20 mg at 10/30/16 0910  . simvastatin (ZOCOR) tablet 40 mg  40 mg Oral q1800 [name], [name], MD   40 mg at 10/30/16 1715   Current Outpatient Prescriptions  Medication Sig Dispense Refill  . cloZAPine (CLOZARIL) 100 MG tablet Take 1 tablet (100 mg total) by mouth daily. Daily at 1700 30 tablet 0  . fluvoxaMINE (LUVOX) 50 MG tablet Take 1 tablet (50 mg total) by mouth daily. Daily at 1700 30 tablet 0  . insulin aspart (NOVOLOG) 100 UNIT/ML injection Inject 5 Units into the skin 3 (three) times daily with meals. 5 mL 0  . insulin glargine (LANTUS) 100 UNIT/ML injection Inject 0.42 mLs (42 Units total) into the skin at bedtime. 13 mL 0  . ipratropium (ATROVENT) 0.06 % nasal spray Place 1 spray into both nostrils daily after supper. 3 mL 0  . lisinopril (PRINIVIL,ZESTRIL) 20 MG tablet Take 1 tablet (20 mg total) by mouth daily. 30 tablet 0  . meloxicam (MOBIC) 7.5 MG tablet Take 1 tablet (7.5 mg total) by mouth 2 (two) times daily. 60 tablet 0  . polyethylene glycol (MIRALAX / GLYCOLAX) packet Take 17 g by mouth daily. 30 each 0  . propranolol (INDERAL) 20 MG tablet Take 1 tablet (20 mg total) by mouth 2 (two) times daily. 60 tablet 0  . cyclobenzaprine (FLEXERIL) 10 MG tablet Take 1 tablet (10 mg total) by mouth 3 (three) times daily as needed for muscle spasms. (Patient not taking: Reported on 10/21/2016) 60 tablet 0  . lidocaine (LIDODERM) 5 % Place 1 patch onto the skin daily. Remove & Discard patch within 12 hours or as directed by MD (Patient not taking: Reported on 10/21/2016) 30 patch 0  . metFORMIN (GLUCOPHAGE) 1000 MG tablet Take 1 tablet (1,000 mg total) by mouth 2 (two) times daily with a meal. (Patient not taking:  Reported on 10/21/2016) 60 tablet 0  . montelukast (SINGULAIR) 10 MG tablet Take 1 tablet (10 mg total) by mouth at bedtime. For Asthma (Patient not taking: Reported on 10/21/2016) 30 tablet 0  . simvastatin (ZOCOR) 40 MG tablet Take 1 tablet (40 mg total) by mouth at bedtime. For high cholesterol (Patient not taking: Reported on 10/21/2016) 15 tablet 0    Musculoskeletal: Strength & Muscle Tone: within normal limits Gait & Station: normal Patient leans: [name]  Psychiatric Specialty Exam: Physical Exam  Nursing note and vitals reviewed. Constitutional: He appears well-developed and well-nourished.  HENT:  Head: Normocephalic and atraumatic.  Eyes: Conjunctivae are normal. Pupils are equal, round, and reactive to light.  Neck: Normal range of motion.  Cardiovascular: Regular rhythm and normal heart sounds.   Respiratory: Effort normal. No respiratory distress.  GI: Soft.  Musculoskeletal: Normal range of motion.  Neurological: He is alert.  Skin: Skin is warm and dry.  Psychiatric: His affect is blunt. His speech is delayed. He is slowed. Thought content is paranoid. Cognition and memory are impaired. He expresses impulsivity and inappropriate judgment. He expresses suicidal ideation.    Review of Systems  Constitutional: Negative.   HENT: Negative.   Eyes: Negative.   Respiratory: Negative.   Cardiovascular: Negative.   Gastrointestinal: Negative.   Musculoskeletal: Negative.   Skin: Negative.   Neurological: Negative.   Psychiatric/Behavioral:  Positive for depression, hallucinations and suicidal ideas. Negative for memory loss and substance abuse. The patient is nervous/anxious. The patient does not have insomnia.     Blood pressure 108/75, pulse 63, temperature 97.8 F (36.6 C), temperature source Oral, resp. rate 18, height 5\' 9"  (1.753 m), weight 77.1 kg (170 lb), SpO2 100 %.Body mass index is 25.1 kg/m.  General Appearance: Disheveled  Eye Contact:  Fair  Speech:  Slow   Volume:  Decreased  Mood:  Depressed  Affect:  Congruent  Thought Process:  Goal Directed  Orientation:  Full (Time, Place, and Person)  Thought Content:  Illogical, Paranoid Ideation and Rumination  Suicidal Thoughts:  Yes.  with intent/plan  Homicidal Thoughts:  No  Memory:  Immediate;   Good Recent;   Fair Remote;   Fair  Judgement:  Fair  Insight:  Shallow  Psychomotor Activity:  Decreased  Concentration:  Concentration: Fair  Recall:  of Knowledge:  Fair  Language:  Fair  Akathisia:  No  Handed:  Right  AIMS (if indicated):     Assets:  Desire for Improvement Social Support  ADL's:  Intact  Cognition:  Impaired,  Mild  Sleep:        Treatment Plan Summary: Daily contact with patient to assess and evaluate symptoms and progress in treatment, Medication management and Plan Continue referral to state hospital. Continue clozapine another medicine. Supportive therapy and encouragement.  Disposition: Supportive therapy provided about ongoing stressors. Discussed crisis plan, support from social network, calling 911, coming to the Emergency Department, and calling Suicide Hotline.  [employer], MD 10/30/2016 6:46 PM

## 2016-10-30 NOTE — ED Notes (Addendum)
Patient awake and up out of bed after taking a nap.  Pt given graham crackers,pb, and milk- turned on tv's 'relaxing' music channel per pt's request.

## 2016-10-30 NOTE — ED Notes (Signed)
Patient ate 50% of lunch

## 2016-10-30 NOTE — ED Notes (Signed)
Dinner brought to patient 

## 2016-10-30 NOTE — ED Notes (Signed)
Lunch brought to patient 

## 2016-10-30 NOTE — ED Notes (Signed)
Patient ate 100% dinner.

## 2016-10-30 NOTE — ED Notes (Signed)
Patient's father called to speak with pt. Pt notified but declined to speak at this time stating, "I"ll call him later." Number to reach Mr. Justiss is [phone]

## 2016-10-30 NOTE — ED Notes (Signed)
Pt is in shower. Room cleaned and fresh sheets on bed.

## 2016-10-30 NOTE — ED Notes (Signed)
Pt has been calm and cooperative, staying in bed for the majority of the day, only getting up to use the bathroom. Pt med compliant with all meds, but declines Miralax.  Pt encouraged to take a shower, but declined, stating,"I just took one last night."  Pt remains safe with 15 minute checks

## 2016-10-30 NOTE — ED Notes (Signed)
Breakfast brought to patient 

## 2016-10-30 NOTE — ED Notes (Signed)

## 2016-10-30 NOTE — ED Notes (Addendum)
Patient depressed with flat affect, exhibiting slow and ,sometimes, disorganized speech. Pt tremulous at times. Pt is calm, cooperative and compliant with medications.  Pt currently denies SI/HI and A/V hallucinations and voices no complaints other than being cold. Pt requests that the lights are off and door closed. Pt  given extra blanket per pt's request. Pt now resting quietly in bed with eyes closed.

## 2016-10-30 NOTE — Progress Notes (Signed)
Inpatient Diabetes Program Recommendations  AACE/ADA: New Consensus Statement on Inpatient Glycemic Control (2015)  Target Ranges:  Prepandial:   less than 140 mg/dL      Peak postprandial:   less than 180 mg/dL (1-2 hours)      Critically ill patients:  140 - 180 mg/dL   Lab Results  Component Value Date   GLUCAP 173 (H) 10/30/2016   HGBA1C 11.2 (H) 10/09/2016    Review of Glycemic Control  Results for Tony Cunningham, Tony Cunningham (MRN [id number]) as of 10/30/2016 16:17  Ref. Range 10/29/2016 11:41 10/29/2016 16:34 10/29/2016 20:31 10/30/2016 08:30 10/30/2016 12:33  Glucose-Capillary Latest Ref Range: 65 - 99 mg/dL 382 (H) 505 (H) 397 (H) 202 (H) 173 (H)    Diabetes history:Type 2 Outpatient Diabetes medications: Lantus 39units qhs, Metformin 1000mg  bid, Novolog 7 units tid  Current orders for Inpatient glycemic control: Lantus 42units qhs, Metformin 1000mg  bid, Novolog 5 units tid, Novolog 0-15 units tid  Inpatient Diabetes Program Recommendations:Consider increasing Novolog mealtime insulin to 7 units tid. Continue Novolog correction as ordered.  , RN, BA, MHA, CDE Diabetes Coordinator Inpatient Diabetes Program  [phone] (Team Pager) [phone] [facility] Office) 10/30/2016 4:20 PM

## 2016-10-30 NOTE — ED Notes (Addendum)
Patient ate 100% breakfast.

## 2016-10-30 NOTE — ED Provider Notes (Signed)
-----------------------------------------   7:36 AM on 10/30/2016 -----------------------------------------   Blood pressure 124/89, pulse 99, temperature 98.6 F (37 C), temperature source Oral, resp. rate 16, height 5\' 9"  (1.753 m), weight 77.1 kg (170 lb), SpO2 98 %.  The patient had no acute events since last update.  Calm and cooperative at this time.  On the wait list for [facility]     [facility], MD 10/30/16 [phone]

## 2016-10-31 DIAGNOSIS — F251 Schizoaffective disorder, depressive type: Secondary | ICD-10-CM | POA: Diagnosis not present

## 2016-10-31 LAB — GLUCOSE, CAPILLARY
Glucose-Capillary: 128 mg/dL — ABNORMAL HIGH (ref 65–99)
Glucose-Capillary: 87 mg/dL (ref 65–99)
Glucose-Capillary: 179 mg/dL — ABNORMAL HIGH (ref 65–99)
Glucose-Capillary: 320 mg/dL — ABNORMAL HIGH (ref 65–99)

## 2016-10-31 NOTE — ED Notes (Signed)
IVC/Still on [facility] Wait list ?

## 2016-10-31 NOTE — ED Notes (Signed)
Tony Cunningham has remained in bed for most of the day. He has had no episodes of enuresis, nor has he been observed urinating in his room. He has made several trips to the bathroom. He refused two phone calls from his father and one from his mother before finally speaking with his father, who wanted to wish him happy birthday. Staff have signed a birthday card for pt as well. The room temperature has been adjusted several times by security for pt, who has complained that the room is cold. The room now feels warmer and pt verbalized recognition of improvement. Pt is now resting. He has denied SI, HI, and AVH today. He remains safe. Will continue to monitor for needs/safety.

## 2016-10-31 NOTE — ED Notes (Signed)
IVC/  PENDING  PLACEMENT 

## 2016-10-31 NOTE — ED Notes (Signed)

## 2016-10-31 NOTE — ED Notes (Signed)
Pt. Alert and oriented, warm and dry, in no distress. Pt. Denies SI, HI, and VH. Pt states still hearing AH. Patient states its just mumbling that he is hearing. Pt. Encouraged to let nursing staff know of any concerns or needs.

## 2016-10-31 NOTE — ED Notes (Signed)
Tony Cunningham was polite upon approach in his room this a.m. His conversation was a big disorganized. He took medications without issue and ate breakfast. He denied SI, HI, and AVH. Tremors were evident as he held cup to drink diet ginger ale. Encouraged him to bring needs, concerns, and questions to staff.

## 2016-10-31 NOTE — ED Provider Notes (Signed)
-----------------------------------------   3:47 AM on 10/31/2016 -----------------------------------------   Blood pressure (!) 142/83, pulse 96, temperature 98.4 F (36.9 C), temperature source Oral, resp. rate 18, height 5\' 9"  (1.753 m), weight 77.1 kg (170 lb), SpO2 99 %.  The patient had no acute events since last update.  Calm and resting well. [facility]waiting [facility] to [facility] at [facility] time     , MD 10/31/16 [phone]

## 2016-11-01 DIAGNOSIS — F251 Schizoaffective disorder, depressive type: Secondary | ICD-10-CM | POA: Diagnosis not present

## 2016-11-01 LAB — GLUCOSE, CAPILLARY
GLUCOSE-CAPILLARY: 72 mg/dL (ref 65–99)
GLUCOSE-CAPILLARY: 118 mg/dL — AB (ref 65–99)
GLUCOSE-CAPILLARY: 155 mg/dL — AB (ref 65–99)
GLUCOSE-CAPILLARY: 338 mg/dL — AB (ref 65–99)

## 2016-11-01 NOTE — ED Notes (Signed)
Patient is IVC and is pending placement. 

## 2016-11-01 NOTE — ED Provider Notes (Signed)
-----------------------------------------   7:03 AM on 11/01/2016 -----------------------------------------   Blood pressure 127/76, pulse 93, temperature 98.1 F (36.7 C), temperature source Oral, resp. rate 18, height 5\' 9"  (1.753 m), weight 77.1 kg (170 lb), SpO2 100 %.  The patient had no acute events since last update.  Patient continues to await placement [facility].   [facility], MD 11/01/16 [phone]

## 2016-11-01 NOTE — BH Assessment (Signed)
Confirmed with [facility](Nina W.-[phone]), patientremainon their WaitList and nothing else needed at this time. 

## 2016-11-01 NOTE — ED Notes (Signed)
Strong urine smell in room. Toileting offered but pt states"I havent pissed anywhere." New linens and scrubs offered.

## 2016-11-01 NOTE — ED Notes (Signed)
Family telephone #'s given to pt.  Did not confirm or deny pt admission to family.  He states "I'm just not in the mood to talk with anyone right now".  Support offered.

## 2016-11-02 DIAGNOSIS — F251 Schizoaffective disorder, depressive type: Secondary | ICD-10-CM | POA: Diagnosis not present

## 2016-11-02 LAB — GLUCOSE, CAPILLARY
GLUCOSE-CAPILLARY: 232 mg/dL — AB (ref 65–99)
GLUCOSE-CAPILLARY: 231 mg/dL — AB (ref 65–99)
Glucose-Capillary: 185 mg/dL — ABNORMAL HIGH (ref 65–99)
Glucose-Capillary: 234 mg/dL — ABNORMAL HIGH (ref 65–99)

## 2016-11-02 NOTE — ED Provider Notes (Signed)
-----------------------------------------   7:56 AM on 11/02/2016 -----------------------------------------   Blood pressure 108/68, pulse 81, temperature 98.6 F (37 C), temperature source Oral, resp. rate 18, height 5\' 9"  (1.753 m), weight 77.1 kg (170 lb), SpO2 99 %.  The patient had no acute events since last update.  Calm and cooperative at this time.  Patient on wait list for [facility].     [facility], MD 11/02/16 [phone]

## 2016-11-02 NOTE — BH Assessment (Signed)
Writer spoke with [facility] ([phone]) patient remains on the Wait List.

## 2016-11-02 NOTE — Consult Note (Signed)
[facility] Face-to-Face Psychiatry Consult   Reason for Consult:  Consult for 41 year old man with a history of schizoaffective disorder or schizophrenia brought back from his group home reporting suicidal thoughts and psychosis Referring Physician:  Paduchowski Patient Identification: Tony Cunningham MRN:  [id number] Principal Diagnosis: Schizoaffective disorder, depressive type [facility]) Diagnosis:   Patient Active Problem List   Diagnosis Date Noted  . Schizoaffective disorder, depressive type (HCC) [F25.1] 09/23/2016  . Polysubstance abuse [F19.10] 07/17/2016  . Tardive dyskinesia [G24.01] 07/16/2016  . Tobacco use disorder [F17.200] 07/07/2016  . Dyslipidemia [E78.5] 07/07/2016  . Asthma [J45.909] 07/07/2016  . HTN (hypertension) [I10] 07/06/2016  . Diabetes (HCC) [E11.9] 12/25/2010    Total Time spent with patient: 20 minutes  Subjective:   Tony Cunningham is a 41 y.o. male patient admitted with "I got suicidal". Follow-up note for Monday, June 26. 41 year old man with a history of schizoaffective disorder. Patient continues to be mostly withdrawn. I spoke with him for a while today and he continues to endorse fear feelings of paranoia of vague suicidal thoughts. We discussed possible disposition and he tells me that he actually prefers to stay right where he is. He likes being isolated here in the emergency room. Patient is not tearful and is not threatening to harm himself in the emergency room. We discussed past treatment including the possibility of referral for ECT. Patient states that he feels comfortable with [facility] and he does not think that it's likely that he will be able to tolerate living in a group home. No change to medicine for today but I think I've clarified a little more what his symptoms are. If it does not look like there is any likelihood of referring [facility] to [facility] we might consider admitting him here if it looks like there were another treatment for his  depression that would be helpful.  [name], June 4 for this 41 year old man with schizophrenia who is still in the emergency room awaiting placement. Patient tells me today he is feeling even worse. He tells me that his tremors are worse although on inspection I don't see any sign of any particular tremor. He tells me when I ask him that the voices are still bad possibly worse. I pressed him for some more details about how he was feeling differently than he was before the weekend and he just got irritable saying that he hasn't been able to lay down for a long time because no one will let him. Patient seems withdrawn and disorganized in his thinking and somewhat paranoid. He has generally been reserved and cooperative in the emergency room  Follow-up 41 year old man with schizophrenia and depressive symptoms. He still reports being very anxious having auditory hallucinations being frightened and worrying about being suicidal. He states withdrawn for the most part but says he is slightly less nervous and upset than he was earlier. We have still kept him in the emergency room with the ideal hope of long-term hospitalization if he does not improve soon. We will continue to reassess to see if there is any appropriate change to treatment plan.  HPI:  Patient interviewed chart reviewed. Patient familiar to me from previous encounters. 41 year old man with chronic mental illness. He was discharged from the inpatient psychiatric unit at our hospital yesterday afternoon. He was to go to a group home. At the time of discharge it was documented clinically that the patient appeared to be doing well and was not reporting  any psychosis or suicidality. And tells me that after he went to the group home last night he started to feel bad. He started to have auditory hallucinations which she says tell him to cut himself. He also started to feel like he was going to hurt himself and that he couldn't  stand to stay there. He inform the group home today and they brought him back over to the hospital. Patient is unclear about whether he actually received his medication any time since his discharge. He does not report however any specific stressor that he can clearly identify ref  Social history: Patient has no family that are able to take care of him now. He has been in family care for a long time but that is no longer possible. [facility] he has been to our hospital multiple times in the last month or so and it has proven very difficult to get him to stay in an outpatient setting.  Medical history: Patient has asthma hypertension and diabetes which particularly has been difficult to control in the past.  Substance abuse history: Smokes too much. Denies any recent alcohol or drug abuse.  Past Psychiatric History: [name] Has been on multiple antipsychotics. There has been some difficulty finding a medicine that he tolerates and also appears to benefit from. He's had multiple hospitalizations in the past. Has had some self injury and some hostility in the past. Patient's recent hospitalization at our facility involve some medication adjustment and it was clearly documented that his symptoms had improved and that he appeared to be doing well at discharge.  Risk to Self: Suicidal Ideation: Yes-Currently Present Suicidal Intent: Yes-Currently Present Is patient at risk for suicide?: Yes Suicidal Plan?: No-Not Currently/Within Last 6 Months Specify Current Suicidal Plan: None Reported Access to Means:  (C) Specify Access to Suicidal Means: UTA What has been your use of drugs/alcohol within the last 12 months?: Pt denies drug/alcohol use. How many times?: 2 Other Self Harm Risks: UTA, None per chart Triggers for Past Attempts: Unknown Intentional Self Injurious Behavior: None Risk to Others: Homicidal  Ideation:  ("I don't know") Thoughts of Harm to Others:  (UTA) Current Homicidal Intent:  (UTA) Current Homicidal Plan:  (UTA) Access to Homicidal Means:  (UTA) History of harm to others?:  (UTA) Does patient have access to weapons?:  (UTA) Criminal Charges Pending?:  (UTA) Does patient have a court date:  (UTA) Prior Inpatient Therapy: Prior Inpatient Therapy: Yes Prior Therapy Dates: Multiple Prior Therapy Facilty/Provider(s): [facility] Reason for Treatment: Schizoaffective d/o Prior Outpatient Therapy: Prior Outpatient Therapy: Yes Prior Therapy Dates: Unkwn Prior Therapy Facilty/Provider(s): Unkwn Reason for Treatment: Schizoaffective d/o Does patient have an ACCT team?: No Does patient have Intensive In-House Services?  : No Does patient have [facility] services? : Unknown Does patient have P4CC services?: No  Past Medical History:  Past Medical History:  Diagnosis Date  . Anxiety   . Asthma   . Diabetes mellitus   . High blood pressure   . Sinus complaint    History reviewed. No pertinent surgical history. Family History: History reviewed. No pertinent family history. Family Psychiatric  History: Patient does not know of any clear family history Social History:  History  Alcohol Use No     History  Drug Use No    Social History   Social History  . Marital status: Single    Spouse name: [name]  .  Number of children: [name]  . Years of education: [name]   Social History Main Topics  . Smoking status: Current Every Day Smoker    Packs/day: 0.50    Types: Cigarettes  . Smokeless tobacco: Never Used  . Alcohol use No  . Drug use: No  . Sexual activity: Not Currently   Other Topics Concern  . None   Social History Narrative  . None   Additional Social History:    Allergies:  No Known Allergies  Labs:  Results for orders placed or performed during the hospital encounter of 10/21/16 (from the past 48 hour(s))  Glucose, capillary     Status: Abnormal   Collection  Time: 10/31/16  8:28 PM  Result Value Ref Range   Glucose-Capillary 320 (H) 65 - 99 mg/dL  Glucose, capillary     Status: None   Collection Time: 11/01/16  7:56 AM  Result Value Ref Range   Glucose-Capillary 72 65 - 99 mg/dL  Glucose, capillary     Status: Abnormal   Collection Time: 11/01/16 11:30 AM  Result Value Ref Range   Glucose-Capillary 118 (H) 65 - 99 mg/dL  Glucose, capillary     Status: Abnormal   Collection Time: 11/01/16  4:56 PM  Result Value Ref Range   Glucose-Capillary 155 (H) 65 - 99 mg/dL  Glucose, capillary     Status: Abnormal   Collection Time: 11/01/16 11:08 PM  Result Value Ref Range   Glucose-Capillary 338 (H) 65 - 99 mg/dL  Glucose, capillary     Status: Abnormal   Collection Time: 11/02/16  8:07 AM  Result Value Ref Range   Glucose-Capillary 185 (H) 65 - 99 mg/dL  Glucose, capillary     Status: Abnormal   Collection Time: 11/02/16 12:16 PM  Result Value Ref Range   Glucose-Capillary 234 (H) 65 - 99 mg/dL  Glucose, capillary     Status: Abnormal   Collection Time: 11/02/16  4:31 PM  Result Value Ref Range   Glucose-Capillary 232 (H) 65 - 99 mg/dL    Current Facility-Administered Medications  Medication Dose Route Frequency Provider Last Rate Last Dose  . acetaminophen (TYLENOL) tablet 650 mg  650 mg Oral Q6H PRN Minna Antis, MD   650 mg at 10/21/16 1343  . cloZAPine (CLOZARIL) tablet 125 mg  125 mg Oral QPC supper Meira Wahba, [name], MD   125 mg at 11/02/16 1642  . fluvoxaMINE (LUVOX) tablet 50 mg  50 mg Oral QPC supper Jari Dipasquale, [name], MD   50 mg at 11/02/16 1642  . insulin aspart (novoLOG) injection 0-15 Units  0-15 Units Subcutaneous TID WC Azharia Surratt, [name], MD   5 Units at 11/02/16 1640  . insulin aspart (novoLOG) injection 5 Units  5 Units Subcutaneous TID WC Amairani Shuey, [name], MD   5 Units at 11/02/16 1640  . insulin glargine (LANTUS) injection 42 Units  42 Units Subcutaneous QHS Peder Allums, [name], MD   42 Units at 11/01/16 2308  . lisinopril  (PRINIVIL,ZESTRIL) tablet 20 mg  20 mg Oral Daily Reagen Haberman, [name], MD   20 mg at 11/02/16 0918  . meloxicam (MOBIC) tablet 7.5 mg  7.5 mg Oral BID PC Jim Lundin T, MD   7.5 mg at 11/02/16 1642  . metFORMIN (GLUCOPHAGE) tablet 1,000 mg  1,000 mg Oral BID WC Annaliah Rivenbark T, MD   1,000 mg at 11/02/16 1642  . montelukast (SINGULAIR) tablet 10 mg  10 mg Oral QHS Lanaiya Lantry, [name], MD  10 mg at 11/01/16 2255  . polyethylene glycol (MIRALAX / GLYCOLAX) packet 17 g  17 g Oral Daily Rhylee Pucillo, [name], MD   17 g at 10/24/16 0954  . propranolol (INDERAL) tablet 20 mg  20 mg Oral BID Aundre Hietala, [name], MD   20 mg at 11/02/16 0918  . simvastatin (ZOCOR) tablet 40 mg  40 mg Oral q1800 Lennette Fader, [name], MD   40 mg at 11/02/16 1641   Current Outpatient Prescriptions  Medication Sig Dispense Refill  . cloZAPine (CLOZARIL) 100 MG tablet Take 1 tablet (100 mg total) by mouth daily. Daily at 1700 30 tablet 0  . fluvoxaMINE (LUVOX) 50 MG tablet Take 1 tablet (50 mg total) by mouth daily. Daily at 1700 30 tablet 0  . insulin aspart (NOVOLOG) 100 UNIT/ML injection Inject 5 Units into the skin 3 (three) times daily with meals. 5 mL 0  . insulin glargine (LANTUS) 100 UNIT/ML injection Inject 0.42 mLs (42 Units total) into the skin at bedtime. 13 mL 0  . ipratropium (ATROVENT) 0.06 % nasal spray Place 1 spray into both nostrils daily after supper. 3 mL 0  . lisinopril (PRINIVIL,ZESTRIL) 20 MG tablet Take 1 tablet (20 mg total) by mouth daily. 30 tablet 0  . meloxicam (MOBIC) 7.5 MG tablet Take 1 tablet (7.5 mg total) by mouth 2 (two) times daily. 60 tablet 0  . polyethylene glycol (MIRALAX / GLYCOLAX) packet Take 17 g by mouth daily. 30 each 0  . propranolol (INDERAL) 20 MG tablet Take 1 tablet (20 mg total) by mouth 2 (two) times daily. 60 tablet 0  . cyclobenzaprine (FLEXERIL) 10 MG tablet Take 1 tablet (10 mg total) by mouth 3 (three) times daily as needed for muscle spasms. (Patient not taking: Reported on 10/21/2016) 60  tablet 0  . lidocaine (LIDODERM) 5 % Place 1 patch onto the skin daily. Remove & Discard patch within 12 hours or as directed by MD (Patient not taking: Reported on 10/21/2016) 30 patch 0  . metFORMIN (GLUCOPHAGE) 1000 MG tablet Take 1 tablet (1,000 mg total) by mouth 2 (two) times daily with a meal. (Patient not taking: Reported on 10/21/2016) 60 tablet 0  . montelukast (SINGULAIR) 10 MG tablet Take 1 tablet (10 mg total) by mouth at bedtime. For Asthma (Patient not taking: Reported on 10/21/2016) 30 tablet 0  . simvastatin (ZOCOR) 40 MG tablet Take 1 tablet (40 mg total) by mouth at bedtime. For high cholesterol (Patient not taking: Reported on 10/21/2016) 15 tablet 0    Musculoskeletal: Strength & Muscle Tone: within normal limits Gait & Station: normal Patient leans: [name]  Psychiatric Specialty Exam: Physical Exam  Nursing note and vitals reviewed. Constitutional: He appears well-developed and well-nourished.  HENT:  Head: Normocephalic and atraumatic.  Eyes: Conjunctivae are normal. Pupils are equal, round, and reactive to light.  Neck: Normal range of motion.  Cardiovascular: Regular rhythm and normal heart sounds.   Respiratory: Effort normal. No respiratory distress.  GI: Soft.  Musculoskeletal: Normal range of motion.  Neurological: He is alert.  Skin: Skin is warm and dry.  Psychiatric: His affect is blunt. His speech is delayed. He is slowed. Thought content is paranoid. Cognition and memory are impaired. He expresses impulsivity and inappropriate judgment. He expresses suicidal ideation.    Review of Systems  Constitutional: Negative.   HENT: Negative.   Eyes: Negative.   Respiratory: Negative.   Cardiovascular: Negative.   Gastrointestinal: Negative.   Musculoskeletal: Negative.   Skin: Negative.  Neurological: Negative.   Psychiatric/Behavioral: Positive for depression, hallucinations and suicidal ideas. Negative for memory loss and substance abuse. The patient is  nervous/anxious. The patient does not have insomnia.     Blood pressure 111/74, pulse 73, temperature 98.5 F (36.9 C), temperature source Oral, resp. rate 18, height 5\' 9"  (1.753 m), weight 77.1 kg (170 lb), SpO2 100 %.Body mass index is 25.1 kg/m.  General Appearance: Disheveled  Eye Contact:  Fair  Speech:  Slow  Volume:  Decreased  Mood:  Depressed  Affect:  Congruent  Thought Process:  Goal Directed  Orientation:  Full (Time, Place, and Person)  Thought Content:  Illogical, Paranoid Ideation and Rumination  Suicidal Thoughts:  Yes.  with intent/plan  Homicidal Thoughts:  No  Memory:  Immediate;   Good Recent;   Fair Remote;   Fair  Judgement:  Fair  Insight:  Shallow  Psychomotor Activity:  Decreased  Concentration:  Concentration: Fair  Recall:  [employer] of Knowledge:  Fair  Language:  Fair  Akathisia:  No  Handed:  Right  AIMS (if indicated):     Assets:  Desire for Improvement Social Support  ADL's:  Intact  Cognition:  Impaired,  Mild  Sleep:        Treatment Plan Summary: Daily contact with patient to assess and evaluate symptoms and progress in treatment, Medication management and Plan See note above. We are continuing to await possible referral [facility] based on failure to respond to several hospital treatments however if that is not available we will reconsider admitting him here. Discharge back to his group home seems very unlikely to succeed once again.  Disposition: Supportive therapy provided about ongoing stressors. Discussed crisis plan, support from social network, calling 911, coming to the Emergency Department, and calling Suicide Hotline.  Mordecai Rasmussen, MD 11/02/2016 6:47 PM

## 2016-11-02 NOTE — ED Notes (Signed)
Pt has remained in bed for the entirety of the day. He denies SI, HI, and AVH. He is pleasant and denies needs, concerns, and questions. Will continue to monitor for needs/safety.

## 2016-11-03 DIAGNOSIS — F251 Schizoaffective disorder, depressive type: Secondary | ICD-10-CM | POA: Diagnosis not present

## 2016-11-03 LAB — GLUCOSE, CAPILLARY
GLUCOSE-CAPILLARY: 178 mg/dL — AB (ref 65–99)
GLUCOSE-CAPILLARY: 175 mg/dL — AB (ref 65–99)
GLUCOSE-CAPILLARY: 232 mg/dL — AB (ref 65–99)
Glucose-Capillary: 117 mg/dL — ABNORMAL HIGH (ref 65–99)

## 2016-11-03 NOTE — ED Notes (Signed)
Pt received dinner tray.

## 2016-11-03 NOTE — ED Notes (Signed)
Pt slept most of the night while monitored on 15 minute safety checks.

## 2016-11-03 NOTE — ED Notes (Signed)
Pt has stayed in bed most of the day. Remains cooperative with staff and compliant with medications. Pt stated being here "feels safe." Maintained on 15 minute checks and observation by security camera for safety.

## 2016-11-03 NOTE — ED Notes (Signed)
Pt calm, cooperative. Compliant with medications. Pt denies SI/HI. Pt received breakfast tray and soft drink. Pt stated he felt groggy this morning. Maintained on 15 minute checks and observation by security camera for safety.

## 2016-11-03 NOTE — ED Provider Notes (Signed)
-----------------------------------------   7:20 AM on 11/03/2016 -----------------------------------------   Blood pressure 111/74, pulse 73, temperature 98.5 F (36.9 C), temperature source Oral, resp. rate 18, height 5\' 9"  (1.753 m), weight 77.1 kg (170 lb), SpO2 100 %.  The patient had no acute events since last update.  Calm and cooperative at this time.  Disposition is pending Psychiatry/Behavioral Medicine team recommendations.     , MD 11/03/16 [phone]

## 2016-11-03 NOTE — ED Notes (Signed)
Pt given a snack and drink,  Given hs meds.  Pt asked if staff knew if he was being transferred to another hospital.  Pt was reminded he was on a waiting list for [facility] but it is unknown when he will go.  Pt denies s/i, h/i or hallucinations.

## 2016-11-03 NOTE — ED Notes (Signed)
Pt laying in bed listening to music. Pt stated he is hearing voices less often. Maintained on 15 minute checks and observation by security camera for safety.

## 2016-11-03 NOTE — ED Notes (Signed)
PT IVC PENDING PLACEMENT/IVC PAPERS TO BE RENEWED ON 06/13.

## 2016-11-03 NOTE — Progress Notes (Signed)
Inpatient Diabetes Program Recommendations  AACE/ADA: New Consensus Statement on Inpatient Glycemic Control (2015)  Target Ranges:  Prepandial:   less than 140 mg/dL      Peak postprandial:   less than 180 mg/dL (1-2 hours)      Critically ill patients:  140 - 180 mg/dL  Results for Tony Cunningham, Tony Cunningham (MRN [id number]) as of 11/03/2016 09:46  Ref. Range 11/02/2016 08:07 11/02/2016 12:16 11/02/2016 16:31 11/02/2016 20:07 11/03/2016 07:37  Glucose-Capillary Latest Ref Range: 65 - 99 mg/dL 378 (H) 588 (H) 502 (H) 231 (H) 117 (H)    Review of Glycemic Control  Diabetes history: DM2 Outpatient Diabetes medications: Lantus 42 units QHS, Novlog 5 units TID with meals, Metformin 1000 mg BID Current orders for Inpatient glycemic control: Lantus 42 units QHS, Novolog 5 units TID with meals for meal coverage, Novolog 0-15 units TID with meals, Metformin 1000 mg BID  Inpatient Diabetes Program Recommendations: Correction (SSI): Please consider ordering Novolog 0-5 units QHS for bedtime correction scale. Insulin - Meal Coverage: Please consider increasing meal coverage to Novolog 8 units TID with meals if patient eats at least 50% of meals.  Thanks, Orlando Penner, RN, MSN, CDE Diabetes Coordinator Inpatient Diabetes Program [phone] (Team Pager from 8am to 5pm)

## 2016-11-04 DIAGNOSIS — F251 Schizoaffective disorder, depressive type: Secondary | ICD-10-CM | POA: Diagnosis not present

## 2016-11-04 LAB — GLUCOSE, CAPILLARY
GLUCOSE-CAPILLARY: 125 mg/dL — AB (ref 65–99)
GLUCOSE-CAPILLARY: 185 mg/dL — AB (ref 65–99)
Glucose-Capillary: 130 mg/dL — ABNORMAL HIGH (ref 65–99)
Glucose-Capillary: 122 mg/dL — ABNORMAL HIGH (ref 65–99)
Glucose-Capillary: 145 mg/dL — ABNORMAL HIGH (ref 65–99)

## 2016-11-04 MED ORDER — PROMETHAZINE HCL 25 MG PO TABS
12.5000 mg | ORAL_TABLET | Freq: Four times a day (QID) | ORAL | Status: DC | PRN
  Administered 2016-11-04: 12.5 mg via ORAL
  Filled 2016-11-04: qty 1

## 2016-11-04 NOTE — ED Notes (Signed)
Pt received dinner tray. Pt stated PRN medication for nausea "helpd a little bit."  Maintained on 15 minute checks and observation by security camera for safety.

## 2016-11-04 NOTE — Progress Notes (Signed)
CSW contacted Patient's Tony Cunningham with [facility] [phone]) to provide update regarding Patient disposition. At this time, Patient remains on [facility] waitlist at the recommendation of Psychiatrist. Ms. Tony Cunningham appreciative of phone call and requests to remain updated on Patient's discharge plan.    Enos Fling, MSW, LCSW [facility] Clinical Social Worker [phone]

## 2016-11-04 NOTE — ED Provider Notes (Signed)
-----------------------------------------   7:09 AM on 11/04/2016 -----------------------------------------   Blood pressure 112/71, pulse (!) 58, temperature 97.6 F (36.4 C), temperature source Oral, resp. rate 18, height 5\' 9"  (1.753 m), weight 77.1 kg (170 lb), SpO2 99 %.  The patient had no acute events since last update.  Continue to await [facility] placement.    [facility], MD 11/04/16 [phone]

## 2016-11-04 NOTE — ED Notes (Signed)
3RD  IVC  DONE PENDING  PLACEMENT

## 2016-11-04 NOTE — BH Assessment (Signed)
Writer spoke with [facility] ([phone]) patient remains on the Wait List.

## 2016-11-04 NOTE — ED Notes (Addendum)
Pt received a call from his mother and would not speak with her. "Ugh, I don't want to talk."  RN explained to caller the pt was not up to talking this evening and to try back tomorrow. Maintained on 15 minute checks and observation by security camera for safety.

## 2016-11-04 NOTE — ED Notes (Signed)
RN attempted throughout the day to have patient shower. When told he needed to shower the patient replied, "No, not now."   Other staff attempted with the same result. Maintained on 15 minute checks and observation by security camera for safety.

## 2016-11-04 NOTE — ED Notes (Signed)
Pt complaining of nausea. No fever. Pt given diet ginger ale.  RN will continue to monitor. Maintained on 15 minute checks and observation by security camera for safety.

## 2016-11-04 NOTE — ED Notes (Signed)
Pt has not eaten lunch tray. Pt continues to complain of nausea. Crackers and diet ginger ale given. No fever, no vomiting. Maintained on 15 minute checks and observation by security camera for safety.

## 2016-11-04 NOTE — BH Assessment (Signed)
Writer spoke with [facility] [phone]) patient remains on the Wait List.

## 2016-11-04 NOTE — ED Notes (Signed)
RN administered medication for nausea as ordered by MD. Pt continues to be afebrile, no vomiting.  Pt did not eat lunch, only graham crackers. Maintained on 15 minute checks and observation by security camera for safety.

## 2016-11-04 NOTE — ED Notes (Signed)
Pt received breakfast tray 

## 2016-11-04 NOTE — ED Notes (Signed)
Pt wanting to get money out of his car and go to work. RN re-oriented pt that he was still in the hospital and did not need his car or money at this time.  Pt returned to his room and laid down. Maintained on 15 minute checks and observation by security camera for safety.

## 2016-11-04 NOTE — ED Notes (Signed)
Pt is alert but somewhat disoriented and mildly confused. Pt needs some redirection from staff but is cooperative and follows instructions for the most part. Pt did refuse to shower. Writer provided snack and adminstered medications as prescribed. 15 minute checks are ongoing for safety.

## 2016-11-05 DIAGNOSIS — F251 Schizoaffective disorder, depressive type: Secondary | ICD-10-CM | POA: Diagnosis not present

## 2016-11-05 LAB — CBC WITH DIFFERENTIAL/PLATELET
BASOS ABS: 0.1 10*3/uL (ref 0–0.1)
BASOS PCT: 1 %
EOS PCT: 7 %
Eosinophils Absolute: 0.5 10*3/uL (ref 0–0.7)
HCT: 42.5 % (ref 40.0–52.0)
Hemoglobin: 14.9 g/dL (ref 13.0–18.0)
Lymphocytes Relative: 37 %
Lymphs Abs: 2.7 10*3/uL (ref 1.0–3.6)
MCH: 31.2 pg (ref 26.0–34.0)
MCHC: 35 g/dL (ref 32.0–36.0)
MCV: 89.1 fL (ref 80.0–100.0)
MONO ABS: 0.4 10*3/uL (ref 0.2–1.0)
Monocytes Relative: 6 %
Neutro Abs: 3.6 10*3/uL (ref 1.4–6.5)
Neutrophils Relative %: 49 %
PLATELETS: 182 10*3/uL (ref 150–440)
RBC: 4.77 MIL/uL (ref 4.40–5.90)
RDW: 12.8 % (ref 11.5–14.5)
WBC: 7.2 10*3/uL (ref 3.8–10.6)

## 2016-11-05 LAB — GLUCOSE, CAPILLARY
GLUCOSE-CAPILLARY: 249 mg/dL — AB (ref 65–99)
Glucose-Capillary: 194 mg/dL — ABNORMAL HIGH (ref 65–99)
Glucose-Capillary: 188 mg/dL — ABNORMAL HIGH (ref 65–99)
Glucose-Capillary: 184 mg/dL — ABNORMAL HIGH (ref 65–99)

## 2016-11-05 NOTE — ED Notes (Signed)
Patient sleeping, rolls around in be at times, patient is calm and cooperative, q 15 minute checks and camera surveillance in progress for safety.

## 2016-11-05 NOTE — ED Notes (Signed)
Meal tray with breakfast was provided for Pt.  

## 2016-11-05 NOTE — ED Notes (Signed)
Patient took all morning medications and insulin without any difficulty, Patient denies si/hi or avh at this time, but does appear disoriented to time and place. patient with q 15 minute checks and camera surveillance in progress.

## 2016-11-05 NOTE — ED Notes (Signed)
Patient ate 100% of lunch and beverage.  

## 2016-11-05 NOTE — ED Provider Notes (Signed)
Vitals:   11/04/16 1704 11/04/16 2100  BP: 125/75 127/85  Pulse: 69 75  Resp: 18 18  Temp:  97.9 F (36.6 C)   Labs Reviewed  COMPREHENSIVE METABOLIC PANEL - Abnormal; Notable for the following:       Result Value   Sodium 133 (*)    Chloride 98 (*)    Glucose, Bld 319 (*)    Calcium 8.7 (*)    Total Protein 5.8 (*)    All other components within normal limits  ACETAMINOPHEN LEVEL - Abnormal; Notable for the following:    Acetaminophen (Tylenol), Serum <10 (*)    All other components within normal limits  URINE DRUG SCREEN, QUALITATIVE ([facility] ONLY) - Abnormal; Notable for the following:    Benzodiazepine, Ur Scrn POSITIVE (*)    All other components within normal limits  GLUCOSE, CAPILLARY - Abnormal; Notable for the following:    Glucose-Capillary 475 (*)    All other components within normal limits  GLUCOSE, CAPILLARY - Abnormal; Notable for the following:    Glucose-Capillary 416 (*)    All other components within normal limits  GLUCOSE, CAPILLARY - Abnormal; Notable for the following:    Glucose-Capillary 291 (*)    All other components within normal limits  GLUCOSE, CAPILLARY - Abnormal; Notable for the following:    Glucose-Capillary 197 (*)    All other components within normal limits  GLUCOSE, CAPILLARY - Abnormal; Notable for the following:    Glucose-Capillary 152 (*)    All other components within normal limits  GLUCOSE, CAPILLARY - Abnormal; Notable for the following:    Glucose-Capillary 299 (*)    All other components within normal limits  GLUCOSE, CAPILLARY - Abnormal; Notable for the following:    Glucose-Capillary 418 (*)    All other components within normal limits  GLUCOSE, CAPILLARY - Abnormal; Notable for the following:    Glucose-Capillary 375 (*)    All other components within normal limits  GLUCOSE, CAPILLARY - Abnormal; Notable for the following:    Glucose-Capillary 347 (*)    All other components within normal limits  GLUCOSE, CAPILLARY -  Abnormal; Notable for the following:    Glucose-Capillary 135 (*)    All other components within normal limits  GLUCOSE, CAPILLARY - Abnormal; Notable for the following:    Glucose-Capillary 122 (*)    All other components within normal limits  GLUCOSE, CAPILLARY - Abnormal; Notable for the following:    Glucose-Capillary 128 (*)    All other components within normal limits  GLUCOSE, CAPILLARY - Abnormal; Notable for the following:    Glucose-Capillary 193 (*)    All other components within normal limits  GLUCOSE, CAPILLARY - Abnormal; Notable for the following:    Glucose-Capillary 153 (*)    All other components within normal limits  GLUCOSE, CAPILLARY - Abnormal; Notable for the following:    Glucose-Capillary 195 (*)    All other components within normal limits  GLUCOSE, CAPILLARY - Abnormal; Notable for the following:    Glucose-Capillary 289 (*)    All other components within normal limits  GLUCOSE, CAPILLARY - Abnormal; Notable for the following:    Glucose-Capillary 336 (*)    All other components within normal limits  GLUCOSE, CAPILLARY - Abnormal; Notable for the following:    Glucose-Capillary 313 (*)    All other components within normal limits  GLUCOSE, CAPILLARY - Abnormal; Notable for the following:    Glucose-Capillary 174 (*)    All other components within normal  limits  GLUCOSE, CAPILLARY - Abnormal; Notable for the following:    Glucose-Capillary 160 (*)    All other components within normal limits  GLUCOSE, CAPILLARY - Abnormal; Notable for the following:    Glucose-Capillary 104 (*)    All other components within normal limits  GLUCOSE, CAPILLARY - Abnormal; Notable for the following:    Glucose-Capillary 149 (*)    All other components within normal limits  GLUCOSE, CAPILLARY - Abnormal; Notable for the following:    Glucose-Capillary 158 (*)    All other components within normal limits  GLUCOSE, CAPILLARY - Abnormal; Notable for the following:     Glucose-Capillary 141 (*)    All other components within normal limits  GLUCOSE, CAPILLARY - Abnormal; Notable for the following:    Glucose-Capillary 258 (*)    All other components within normal limits  GLUCOSE, CAPILLARY - Abnormal; Notable for the following:    Glucose-Capillary 161 (*)    All other components within normal limits  GLUCOSE, CAPILLARY - Abnormal; Notable for the following:    Glucose-Capillary 202 (*)    All other components within normal limits  GLUCOSE, CAPILLARY - Abnormal; Notable for the following:    Glucose-Capillary 340 (*)    All other components within normal limits  GLUCOSE, CAPILLARY - Abnormal; Notable for the following:    Glucose-Capillary 213 (*)    All other components within normal limits  GLUCOSE, CAPILLARY - Abnormal; Notable for the following:    Glucose-Capillary 118 (*)    All other components within normal limits  GLUCOSE, CAPILLARY - Abnormal; Notable for the following:    Glucose-Capillary 293 (*)    All other components within normal limits  GLUCOSE, CAPILLARY - Abnormal; Notable for the following:    Glucose-Capillary 323 (*)    All other components within normal limits  GLUCOSE, CAPILLARY - Abnormal; Notable for the following:    Glucose-Capillary 202 (*)    All other components within normal limits  GLUCOSE, CAPILLARY - Abnormal; Notable for the following:    Glucose-Capillary 173 (*)    All other components within normal limits  GLUCOSE, CAPILLARY - Abnormal; Notable for the following:    Glucose-Capillary 266 (*)    All other components within normal limits  GLUCOSE, CAPILLARY - Abnormal; Notable for the following:    Glucose-Capillary 324 (*)    All other components within normal limits  GLUCOSE, CAPILLARY - Abnormal; Notable for the following:    Glucose-Capillary 128 (*)    All other components within normal limits  GLUCOSE, CAPILLARY - Abnormal; Notable for the following:    Glucose-Capillary 179 (*)    All other  components within normal limits  GLUCOSE, CAPILLARY - Abnormal; Notable for the following:    Glucose-Capillary 320 (*)    All other components within normal limits  GLUCOSE, CAPILLARY - Abnormal; Notable for the following:    Glucose-Capillary 118 (*)    All other components within normal limits  GLUCOSE, CAPILLARY - Abnormal; Notable for the following:    Glucose-Capillary 155 (*)    All other components within normal limits  GLUCOSE, CAPILLARY - Abnormal; Notable for the following:    Glucose-Capillary 338 (*)    All other components within normal limits  GLUCOSE, CAPILLARY - Abnormal; Notable for the following:    Glucose-Capillary 185 (*)    All other components within normal limits  GLUCOSE, CAPILLARY - Abnormal; Notable for the following:    Glucose-Capillary 234 (*)    All other components  within normal limits  GLUCOSE, CAPILLARY - Abnormal; Notable for the following:    Glucose-Capillary 232 (*)    All other components within normal limits  GLUCOSE, CAPILLARY - Abnormal; Notable for the following:    Glucose-Capillary 231 (*)    All other components within normal limits  GLUCOSE, CAPILLARY - Abnormal; Notable for the following:    Glucose-Capillary 117 (*)    All other components within normal limits  GLUCOSE, CAPILLARY - Abnormal; Notable for the following:    Glucose-Capillary 178 (*)    All other components within normal limits  GLUCOSE, CAPILLARY - Abnormal; Notable for the following:    Glucose-Capillary 175 (*)    All other components within normal limits  GLUCOSE, CAPILLARY - Abnormal; Notable for the following:    Glucose-Capillary 232 (*)    All other components within normal limits  GLUCOSE, CAPILLARY - Abnormal; Notable for the following:    Glucose-Capillary 130 (*)    All other components within normal limits  GLUCOSE, CAPILLARY - Abnormal; Notable for the following:    Glucose-Capillary 122 (*)    All other components within normal limits  GLUCOSE,  CAPILLARY - Abnormal; Notable for the following:    Glucose-Capillary 125 (*)    All other components within normal limits  GLUCOSE, CAPILLARY - Abnormal; Notable for the following:    Glucose-Capillary 145 (*)    All other components within normal limits  GLUCOSE, CAPILLARY - Abnormal; Notable for the following:    Glucose-Capillary 185 (*)    All other components within normal limits  GLUCOSE, CAPILLARY - Abnormal; Notable for the following:    Glucose-Capillary 194 (*)    All other components within normal limits  ETHANOL  SALICYLATE LEVEL  CBC  GLUCOSE, CAPILLARY  GLUCOSE, CAPILLARY  GLUCOSE, CAPILLARY  GLUCOSE, CAPILLARY  CBC WITH DIFFERENTIAL/PLATELET  GLUCOSE, CAPILLARY  GLUCOSE, CAPILLARY  GLUCOSE, CAPILLARY  GLUCOSE, CAPILLARY  CBC WITH DIFFERENTIAL/PLATELET   Patient remains medically stable for psychiatric disposition   Emily Filbert, MD 11/05/16 [phone]

## 2016-11-05 NOTE — ED Notes (Signed)
Pt is awake and alert this evening. Pt is still anxious but also pleasant and cooperative with staff. Writer encouraged pt to shower and he agreed. EVS called for deep clean of patients room. Linens changed as well. Pt is still wandering in multiple rooms but is redirectable and taking medications as prescribed. 15 minute checks ongoing for safety.

## 2016-11-06 ENCOUNTER — Inpatient Hospital Stay
Admission: EM | Admit: 2016-11-06 | Discharge: 2016-12-08 | DRG: 885 | Disposition: A | Discharge status: Home / Self Care | Payer: Medicare Other | Source: Intra-hospital | Attending: Psychiatry | Admitting: Psychiatry

## 2016-11-06 DIAGNOSIS — K219 Gastro-esophageal reflux disease without esophagitis: Secondary | ICD-10-CM | POA: Diagnosis present

## 2016-11-06 DIAGNOSIS — G4700 Insomnia, unspecified: Secondary | ICD-10-CM | POA: Diagnosis present

## 2016-11-06 DIAGNOSIS — Z794 Long term (current) use of insulin: Secondary | ICD-10-CM | POA: Diagnosis not present

## 2016-11-06 DIAGNOSIS — K5900 Constipation, unspecified: Secondary | ICD-10-CM | POA: Diagnosis present

## 2016-11-06 DIAGNOSIS — F419 Anxiety disorder, unspecified: Secondary | ICD-10-CM | POA: Diagnosis present

## 2016-11-06 DIAGNOSIS — Z79899 Other long term (current) drug therapy: Secondary | ICD-10-CM

## 2016-11-06 DIAGNOSIS — M549 Dorsalgia, unspecified: Secondary | ICD-10-CM | POA: Diagnosis not present

## 2016-11-06 DIAGNOSIS — G2401 Drug induced subacute dyskinesia: Secondary | ICD-10-CM | POA: Diagnosis present

## 2016-11-06 DIAGNOSIS — F17210 Nicotine dependence, cigarettes, uncomplicated: Secondary | ICD-10-CM | POA: Diagnosis present

## 2016-11-06 DIAGNOSIS — E118 Type 2 diabetes mellitus with unspecified complications: Secondary | ICD-10-CM | POA: Diagnosis not present

## 2016-11-06 DIAGNOSIS — E785 Hyperlipidemia, unspecified: Secondary | ICD-10-CM | POA: Diagnosis present

## 2016-11-06 DIAGNOSIS — I10 Essential (primary) hypertension: Secondary | ICD-10-CM | POA: Diagnosis present

## 2016-11-06 DIAGNOSIS — R200 Anesthesia of skin: Secondary | ICD-10-CM

## 2016-11-06 DIAGNOSIS — F251 Schizoaffective disorder, depressive type: Principal | ICD-10-CM | POA: Diagnosis present

## 2016-11-06 DIAGNOSIS — E1165 Type 2 diabetes mellitus with hyperglycemia: Secondary | ICD-10-CM | POA: Diagnosis present

## 2016-11-06 DIAGNOSIS — R45851 Suicidal ideations: Secondary | ICD-10-CM | POA: Diagnosis present

## 2016-11-06 DIAGNOSIS — J302 Other seasonal allergic rhinitis: Secondary | ICD-10-CM | POA: Diagnosis not present

## 2016-11-06 DIAGNOSIS — F1910 Other psychoactive substance abuse, uncomplicated: Secondary | ICD-10-CM | POA: Diagnosis present

## 2016-11-06 DIAGNOSIS — F17200 Nicotine dependence, unspecified, uncomplicated: Secondary | ICD-10-CM | POA: Diagnosis present

## 2016-11-06 DIAGNOSIS — J45909 Unspecified asthma, uncomplicated: Secondary | ICD-10-CM | POA: Diagnosis present

## 2016-11-06 DIAGNOSIS — E119 Type 2 diabetes mellitus without complications: Secondary | ICD-10-CM

## 2016-11-06 DIAGNOSIS — M069 Rheumatoid arthritis, unspecified: Secondary | ICD-10-CM | POA: Diagnosis present

## 2016-11-06 LAB — GLUCOSE, CAPILLARY
GLUCOSE-CAPILLARY: 148 mg/dL — AB (ref 65–99)
GLUCOSE-CAPILLARY: 165 mg/dL — AB (ref 65–99)
GLUCOSE-CAPILLARY: 273 mg/dL — AB (ref 65–99)
Glucose-Capillary: 171 mg/dL — ABNORMAL HIGH (ref 65–99)

## 2016-11-06 MED ORDER — SIMVASTATIN 40 MG PO TABS
40.0000 mg | ORAL_TABLET | Freq: Every day | ORAL | Status: DC
  Administered 2016-11-07 – 2016-12-07 (×31): 40 mg via ORAL
  Filled 2016-11-06 (×31): qty 1

## 2016-11-06 MED ORDER — ACETAMINOPHEN 325 MG PO TABS
650.0000 mg | ORAL_TABLET | Freq: Four times a day (QID) | ORAL | Status: DC | PRN
  Administered 2016-11-19: 650 mg via ORAL
  Filled 2016-11-06: qty 2

## 2016-11-06 MED ORDER — POLYETHYLENE GLYCOL 3350 17 G PO PACK
17.0000 g | PACK | Freq: Every day | ORAL | Status: DC
  Administered 2016-11-07 – 2016-12-08 (×19): 17 g via ORAL
  Filled 2016-11-06 (×20): qty 1

## 2016-11-06 MED ORDER — CLOZAPINE 100 MG PO TABS
150.0000 mg | ORAL_TABLET | Freq: Every day | ORAL | Status: DC
  Administered 2016-11-06: 125 mg via ORAL

## 2016-11-06 MED ORDER — ALUM & MAG HYDROXIDE-SIMETH 200-200-20 MG/5ML PO SUSP: 30.0000 mL | ORAL | Status: DC | PRN

## 2016-11-06 MED ORDER — MAGNESIUM HYDROXIDE 400 MG/5ML PO SUSP: 30.0000 mL | Freq: Every day | ORAL | Status: DC | PRN

## 2016-11-06 MED ORDER — MELOXICAM 7.5 MG PO TABS
7.5000 mg | ORAL_TABLET | Freq: Two times a day (BID) | ORAL | Status: DC
  Administered 2016-11-07 – 2016-12-08 (×58): 7.5 mg via ORAL
  Filled 2016-11-06 (×58): qty 1

## 2016-11-06 MED ORDER — FLUVOXAMINE MALEATE 50 MG PO TABS
50.0000 mg | ORAL_TABLET | Freq: Two times a day (BID) | ORAL | Status: DC
  Administered 2016-11-07 – 2016-11-12 (×11): 50 mg via ORAL
  Filled 2016-11-06 (×11): qty 1

## 2016-11-06 MED ORDER — PROMETHAZINE HCL 25 MG PO TABS: 12.5000 mg | ORAL_TABLET | Freq: Four times a day (QID) | ORAL | Status: DC | PRN

## 2016-11-06 MED ORDER — LISINOPRIL 20 MG PO TABS
20.0000 mg | ORAL_TABLET | Freq: Every day | ORAL | Status: DC
  Administered 2016-11-07 – 2016-12-08 (×31): 20 mg via ORAL
  Filled 2016-11-06 (×33): qty 1

## 2016-11-06 MED ORDER — INSULIN ASPART 100 UNIT/ML ~~LOC~~ SOLN
5.0000 [IU] | Freq: Three times a day (TID) | SUBCUTANEOUS | Status: DC
  Administered 2016-11-07 – 2016-11-10 (×10): 5 [IU] via SUBCUTANEOUS
  Filled 2016-11-06 (×7): qty 1

## 2016-11-06 MED ORDER — ACETAMINOPHEN 325 MG PO TABS: 650.0000 mg | ORAL_TABLET | Freq: Four times a day (QID) | ORAL | Status: DC | PRN

## 2016-11-06 MED ORDER — METFORMIN HCL 500 MG PO TABS
1000.0000 mg | ORAL_TABLET | Freq: Two times a day (BID) | ORAL | Status: DC
  Administered 2016-11-07 – 2016-12-08 (×58): 1000 mg via ORAL
  Filled 2016-11-06 (×59): qty 2

## 2016-11-06 MED ORDER — MONTELUKAST SODIUM 10 MG PO TABS
10.0000 mg | ORAL_TABLET | Freq: Every day | ORAL | Status: DC
  Administered 2016-11-07 – 2016-12-07 (×32): 10 mg via ORAL
  Filled 2016-11-06 (×31): qty 1

## 2016-11-06 MED ORDER — CLOZAPINE 25 MG PO TABS
150.0000 mg | ORAL_TABLET | Freq: Every day | ORAL | Status: DC
  Administered 2016-11-07: 150 mg via ORAL
  Filled 2016-11-06: qty 1

## 2016-11-06 MED ORDER — INSULIN ASPART 100 UNIT/ML ~~LOC~~ SOLN
0.0000 [IU] | Freq: Three times a day (TID) | SUBCUTANEOUS | Status: DC
  Administered 2016-11-07: 3 [IU] via SUBCUTANEOUS
  Administered 2016-11-07: 8 [IU] via SUBCUTANEOUS
  Administered 2016-11-07: 5 [IU] via SUBCUTANEOUS
  Administered 2016-11-08: 2 [IU] via SUBCUTANEOUS
  Administered 2016-11-08: 3 [IU] via SUBCUTANEOUS
  Administered 2016-11-09: 11 [IU] via SUBCUTANEOUS
  Administered 2016-11-09: 15 [IU] via SUBCUTANEOUS
  Administered 2016-11-09: 8 [IU] via SUBCUTANEOUS
  Administered 2016-11-10: 15 [IU] via SUBCUTANEOUS
  Administered 2016-11-10: 5 [IU] via SUBCUTANEOUS
  Administered 2016-11-10: 2 [IU] via SUBCUTANEOUS
  Administered 2016-11-11: 5 [IU] via SUBCUTANEOUS
  Administered 2016-11-11: 8 [IU] via SUBCUTANEOUS
  Administered 2016-11-11: 5 [IU] via SUBCUTANEOUS
  Administered 2016-11-12: 3 [IU] via SUBCUTANEOUS
  Administered 2016-11-12 (×2): 11 [IU] via SUBCUTANEOUS
  Administered 2016-11-13: 8 [IU] via SUBCUTANEOUS
  Administered 2016-11-13 – 2016-11-14 (×2): 5 [IU] via SUBCUTANEOUS
  Administered 2016-11-14: 2 [IU] via SUBCUTANEOUS
  Administered 2016-11-15: 5 [IU] via SUBCUTANEOUS
  Administered 2016-11-15: 2 [IU] via SUBCUTANEOUS
  Administered 2016-11-16: 5 [IU] via SUBCUTANEOUS
  Administered 2016-11-16: 2 [IU] via SUBCUTANEOUS
  Administered 2016-11-16: 3 [IU] via SUBCUTANEOUS
  Administered 2016-11-17: 11 [IU] via SUBCUTANEOUS
  Administered 2016-11-17: 5 [IU] via SUBCUTANEOUS
  Administered 2016-11-17 – 2016-11-18 (×2): 3 [IU] via SUBCUTANEOUS
  Administered 2016-11-18: 8 [IU] via SUBCUTANEOUS
  Filled 2016-11-06 (×20): qty 1

## 2016-11-06 MED ORDER — PROPRANOLOL HCL 20 MG PO TABS
20.0000 mg | ORAL_TABLET | Freq: Two times a day (BID) | ORAL | Status: DC
  Administered 2016-11-07 – 2016-12-08 (×61): 20 mg via ORAL
  Filled 2016-11-06 (×62): qty 1

## 2016-11-06 MED ORDER — INSULIN GLARGINE 100 UNIT/ML ~~LOC~~ SOLN
42.0000 [IU] | Freq: Every day | SUBCUTANEOUS | Status: DC
  Administered 2016-11-07 – 2016-11-09 (×3): 42 [IU] via SUBCUTANEOUS
  Filled 2016-11-06 (×4): qty 0.42

## 2016-11-06 MED ORDER — FLUVOXAMINE MALEATE 50 MG PO TABS: 50.0000 mg | ORAL_TABLET | Freq: Two times a day (BID) | ORAL | Status: DC

## 2016-11-06 NOTE — ED Notes (Signed)
Patient is pleasant, responds to questions, but has flat affect, patient's family member called, but He did not want to talk to her, but she states she will come to see him the weekend. Patient states he would like company. Nurse will continue to monitor, q 15 minute checks and camera surveillance in progress for safety.

## 2016-11-06 NOTE — ED Notes (Signed)
Patient received PM snack. 

## 2016-11-06 NOTE — BH Assessment (Signed)
Patient is to be admitted to [facility] by Dr. Toni Amend.  Attending Physician will be Dr. Toni Amend.   Patient has been assigned to room 309, by [facility] Charge Nurse [place].   ER staff is aware of the admission Misty Stanley, ER Sect.; Toniann Fail,  Patient's Nurse & Sharia Reeve, Patient Access).  Pt may transfer after 1930.

## 2016-11-06 NOTE — ED Notes (Signed)
Patient is eating snack, nurse encouraged him to come out of room and go into dayroom, Patient states that he was ok in room, nurse let Patient know that He would be going downstairs for treatment, and he states "I don't like it down there, but I will go' patient is cooperative, no behavioral issues, will continue to monitor, q 15 minute checks and camera surveillance in progress for safety.

## 2016-11-06 NOTE — ED Notes (Signed)
Patient ate 100% of breakfast and beverage.  

## 2016-11-06 NOTE — ED Notes (Signed)
Patient has had a shower.

## 2016-11-06 NOTE — ED Notes (Signed)
Report was received from Amy B., RN; Pt. Verbalizes no complaints or distress; denies S.I./Hi.; awaiting transfer to BMU; Continue to monitor with 15 min. Monitoring.

## 2016-11-06 NOTE — ED Notes (Signed)
Patient ate 100% of lunch tray and beverage. Patient is cooperative and calm, no behavioral issues at this time, q 15 minute checks and camera surveillance in progress for safety at this time.

## 2016-11-06 NOTE — Consult Note (Signed)
[facility] Face-to-Face Psychiatry Consult   Reason for Consult:  Consult for 41 year old man with a history of schizoaffective disorder or schizophrenia brought back from his group home reporting suicidal thoughts and psychosis Referring Physician:  Paduchowski Patient Identification: Tony Cunningham MRN:  [id number] Principal Diagnosis: Schizoaffective disorder, depressive type [facility]) Diagnosis:   Patient Active Problem List   Diagnosis Date Noted  . Schizoaffective disorder, depressive type (HCC) [F25.1] 09/23/2016  . Polysubstance abuse [F19.10] 07/17/2016  . Tardive dyskinesia [G24.01] 07/16/2016  . Tobacco use disorder [F17.200] 07/07/2016  . Dyslipidemia [E78.5] 07/07/2016  . Asthma [J45.909] 07/07/2016  . HTN (hypertension) [I10] 07/06/2016  . Diabetes (HCC) [E11.9] 12/25/2010    Total Time spent with patient: 20 minutes  Subjective:   Tony Cunningham is a 41 y.o. male patient admitted with "I got suicidal". Follow-up note for Monday, June 14. 41 year old man with a history of schizoaffective disorder. Patient continues to be mostly withdrawn. I spoke with him for a while today and he continues to endorse fear feelings of paranoia of vague suicidal thoughts. We discussed possible disposition and he tells me that he actually prefers to stay right where he is. He likes being isolated here in the emergency room. Patient is not tearful and is not threatening to harm himself in the emergency room. We discussed past treatment including the possibility of referral for ECT. Patient states that he feels comfortable with referral to [facility] and he does not think that it's likely that he will be able to tolerate living in a group home. No change to medicine for today but I think I've clarified a little more what his symptoms are. If it does not look like there is any likelihood of referring him to [facility] we might consider admitting him here if it looks like there were another treatment for his  depression that would be helpful.  [name], June 4 for this 41 year old man with schizophrenia who is still in the emergency room awaiting placement. Patient tells me today he is feeling even worse. He tells me that his tremors are worse although on inspection I don't see any sign of any particular tremor. He tells me when I ask him that the voices are still bad possibly worse. I pressed him for some more details about how he was feeling differently than he was before the weekend and he just got irritable saying that he hasn't been able to lay down for a long time because no one will let him. Patient seems withdrawn and disorganized in his thinking and somewhat paranoid. He has generally been reserved and cooperative in the emergency room  Follow-up 41 year old man with schizophrenia and depressive symptoms. He still reports being very anxious having auditory hallucinations being frightened and worrying about being suicidal. He states withdrawn for the most part but says he is slightly less nervous and upset than he was earlier. We have still kept him in the emergency room with the ideal hope of long-term hospitalization if he does not improve soon. We will continue to reassess to see if there is any appropriate change to treatment plan.  This is a follow-up note for this 41 year old man with schizoaffective disorder. Patient reassessed today. Spoke with TTS and nursing. Patient has been here in the emergency room for over 2 weeks now with the plan being possible referral to [facility]. During that time he has continued to receive appropriate psychiatric medicine and yet has shown no improvement in  his mood. On review today he states he still feels very depressed down and has passive suicidal thoughts feels disorganized and paranoid. Does not feel safe with being outside the hospital. We had another discussion today about ECT treatment.  HPI:  Patient interviewed  chart reviewed. Patient familiar to me from previous encounters. 41 year old man with chronic mental illness. He was discharged from the inpatient psychiatric unit at our hospital yesterday afternoon. He was to go to a group home. At the time of discharge it was documented clinically that the patient appeared to be doing well and was not reporting any psychosis or suicidality. And tells me that after he went to the group home last night he started to feel bad. He started to have auditory hallucinations which she says tell him to cut himself. He also started to feel like he was going to hurt himself and that he couldn't stand to stay there. He inform the group home today and they brought him back over to the hospital. Patient is unclear about whether he actually received his medication any time since his discharge. He does not report however any specific stressor that he can clearly identify ref  Social history: Patient has no family that are able to take care of him now. He has been in family care for a long time but that is no longer possible. [facility] he has been to our hospital multiple times in the last month or so and it has proven very difficult to get him to stay in an outpatient setting.  Medical history: Patient has asthma hypertension and diabetes which particularly has been difficult to control in the past.  Substance abuse history: Smokes too much. Denies any recent alcohol or drug abuse.  Past Psychiatric History: [name] Has been on multiple antipsychotics. There has been some difficulty finding a medicine that he tolerates and also appears to benefit from. He's had multiple hospitalizations in the past. Has had some self injury and some hostility in the past. Patient's recent hospitalization at our facility involve some medication adjustment and it was clearly documented that his symptoms had  improved and that he appeared to be doing well at discharge.  Risk to Self: Suicidal Ideation: Yes-Currently Present Suicidal Intent: Yes-Currently Present Is patient at risk for suicide?: Yes Suicidal Plan?: No-Not Currently/Within Last 6 Months Specify Current Suicidal Plan: None Reported Access to Means:  (C) Specify Access to Suicidal Means: UTA What has been your use of drugs/alcohol within the last 12 months?: Pt denies drug/alcohol use. How many times?: 2 Other Self Harm Risks: UTA, None per chart Triggers for Past Attempts: Unknown Intentional Self Injurious Behavior: None Risk to Others: Homicidal Ideation:  ("I don't know") Thoughts of Harm to Others:  (UTA) Current Homicidal Intent:  (UTA) Current Homicidal Plan:  (UTA) Access to Homicidal Means:  (UTA) History of harm to others?:  (UTA) Does patient have access to weapons?:  (UTA) Criminal Charges Pending?:  (UTA) Does patient have a court date:  (UTA) Prior Inpatient Therapy: Prior Inpatient Therapy: Yes Prior Therapy Dates: Multiple Prior Therapy Facilty/Provider(s): [facility] Reason for Treatment: Schizoaffective d/o Prior Outpatient Therapy: Prior Outpatient Therapy: Yes Prior Therapy Dates: Unkwn Prior Therapy Facilty/Provider(s): Unkwn Reason for Treatment: Schizoaffective d/o Does patient have an ACCT team?: No Does patient have Intensive In-House Services?  : No Does patient have [facility] services? : Unknown Does patient have P4CC services?: No  Past Medical History:  Past Medical History:  Diagnosis Date  . Anxiety   . Asthma   . Diabetes mellitus   . High blood pressure   . Sinus complaint    History reviewed. No pertinent surgical history. Family History: History reviewed. No pertinent family history. Family Psychiatric  History: Patient does not know of any clear family history Social History:  History  Alcohol Use No     History  Drug Use No    Social History   Social History  . Marital  status: Single    Spouse name: [name]  . Number of children: [name]  . Years of education: [name]   Social History Main Topics  . Smoking status: Current Every Day Smoker    Packs/day: 0.50    Types: Cigarettes  . Smokeless tobacco: Never Used  . Alcohol use No  . Drug use: No  . Sexual activity: Not Currently   Other Topics Concern  . None   Social History Narrative  . None   Additional Social History:    Allergies:  No Known Allergies  Labs:  Results for orders placed or performed during the hospital encounter of 10/21/16 (from the past 48 hour(s))  CBC with Differential/Platelet     Status: None   Collection Time: 11/04/16  4:54 PM  Result Value Ref Range   WBC 7.2 3.8 - 10.6 K/uL   RBC 4.77 4.40 - 5.90 MIL/uL   Hemoglobin 14.9 13.0 - 18.0 g/dL   HCT 27.6 39.4 - 32.0 %   MCV 89.1 80.0 - 100.0 fL   MCH 31.2 26.0 - 34.0 pg   MCHC 35.0 32.0 - 36.0 g/dL   RDW 03.7 94.4 - 46.1 %   Platelets 182 150 - 440 K/uL   Neutrophils Relative % 49 %   Neutro Abs 3.6 1.4 - 6.5 K/uL   Lymphocytes Relative 37 %   Lymphs Abs 2.7 1.0 - 3.6 K/uL   Monocytes Relative 6 %   Monocytes Absolute 0.4 0.2 - 1.0 K/uL   Eosinophils Relative 7 %   Eosinophils Absolute 0.5 0 - 0.7 K/uL   Basophils Relative 1 %   Basophils Absolute 0.1 0 - 0.1 K/uL  Glucose, capillary     Status: Abnormal   Collection Time: 11/04/16  8:41 PM  Result Value Ref Range   Glucose-Capillary 185 (H) 65 - 99 mg/dL  Glucose, capillary     Status: Abnormal   Collection Time: 11/05/16  8:02 AM  Result Value Ref Range   Glucose-Capillary 194 (H) 65 - 99 mg/dL  Glucose, capillary     Status: Abnormal   Collection Time: 11/05/16 11:54 AM  Result Value Ref Range   Glucose-Capillary 188 (H) 65 - 99 mg/dL  Glucose, capillary     Status: Abnormal   Collection Time: 11/05/16  4:31 PM  Result Value Ref Range   Glucose-Capillary 184 (H) 65 - 99 mg/dL  Glucose, capillary     Status: Abnormal   Collection Time: 11/05/16 10:33 PM   Result Value Ref Range   Glucose-Capillary 249 (H) 65 - 99 mg/dL  Glucose, capillary     Status: Abnormal   Collection Time: 11/06/16  7:59 AM  Result Value Ref Range   Glucose-Capillary 171 (H) 65 - 99 mg/dL   Comment 1 QC Due   Glucose, capillary     Status: Abnormal   Collection Time: 11/06/16 12:04 PM  Result Value Ref Range   Glucose-Capillary 148 (H) 65 - 99  mg/dL  Glucose, capillary     Status: Abnormal   Collection Time: 11/06/16  4:33 PM  Result Value Ref Range   Glucose-Capillary 165 (H) 65 - 99 mg/dL    Current Facility-Administered Medications  Medication Dose Route Frequency Provider Last Rate Last Dose  . acetaminophen (TYLENOL) tablet 650 mg  650 mg Oral Q6H PRN Minna Antis, MD   650 mg at 10/21/16 1343  . cloZAPine (CLOZARIL) tablet 125 mg  125 mg Oral QPC supper [name], [name], MD   125 mg at 11/05/16 1639  . fluvoxaMINE (LUVOX) tablet 50 mg  50 mg Oral QPC supper [name], [name], MD   50 mg at 11/05/16 1640  . insulin aspart (novoLOG) injection 0-15 Units  0-15 Units Subcutaneous TID WC [name], [name], MD   3 Units at 11/06/16 0810  . insulin aspart (novoLOG) injection 5 Units  5 Units Subcutaneous TID WC [name], [name], MD   5 Units at 11/06/16 1235  . insulin glargine (LANTUS) injection 42 Units  42 Units Subcutaneous QHS [name], [name], MD   42 Units at 11/05/16 2245  . lisinopril (PRINIVIL,ZESTRIL) tablet 20 mg  20 mg Oral Daily [name], [name], MD   20 mg at 11/06/16 0809  . meloxicam (MOBIC) tablet 7.5 mg  7.5 mg Oral BID PC [name], [name], MD   7.5 mg at 11/06/16 0810  . metFORMIN (GLUCOPHAGE) tablet 1,000 mg  1,000 mg Oral BID WC [name], [name], MD   1,000 mg at 11/06/16 0810  . montelukast (SINGULAIR) tablet 10 mg  10 mg Oral QHS [name], [name], MD   10 mg at 11/05/16 2246  . polyethylene glycol (MIRALAX / GLYCOLAX) packet 17 g  17 g Oral Daily [name], [name], MD   17 g at 10/24/16 0954  . promethazine (PHENERGAN) tablet 12.5 mg  12.5 mg Oral  Q6H PRN [name], [name], MD   12.5 mg at 11/04/16 1416  . propranolol (INDERAL) tablet 20 mg  20 mg Oral BID [name], [name], MD   20 mg at 11/06/16 0809  . simvastatin (ZOCOR) tablet 40 mg  40 mg Oral q1800 [name], [name], MD   40 mg at 11/05/16 1637   Current Outpatient Prescriptions  Medication Sig Dispense Refill  . cloZAPine (CLOZARIL) 100 MG tablet Take 1 tablet (100 mg total) by mouth daily. Daily at 1700 30 tablet 0  . fluvoxaMINE (LUVOX) 50 MG tablet Take 1 tablet (50 mg total) by mouth daily. Daily at 1700 30 tablet 0  . insulin aspart (NOVOLOG) 100 UNIT/ML injection Inject 5 Units into the skin 3 (three) times daily with meals. 5 mL 0  . insulin glargine (LANTUS) 100 UNIT/ML injection Inject 0.42 mLs (42 Units total) into the skin at bedtime. 13 mL 0  . ipratropium (ATROVENT) 0.06 % nasal spray Place 1 spray into both nostrils daily after supper. 3 mL 0  . lisinopril (PRINIVIL,ZESTRIL) 20 MG tablet Take 1 tablet (20 mg total) by mouth daily. 30 tablet 0  . meloxicam (MOBIC) 7.5 MG tablet Take 1 tablet (7.5 mg total) by mouth 2 (two) times daily. 60 tablet 0  . polyethylene glycol (MIRALAX / GLYCOLAX) packet Take 17 g by mouth daily. 30 each 0  . propranolol (INDERAL) 20 MG tablet Take 1 tablet (20 mg total) by mouth 2 (two) times daily. 60 tablet 0  . cyclobenzaprine (FLEXERIL) 10 MG tablet Take 1 tablet (10 mg total) by mouth 3 (three)  times daily as needed for muscle spasms. (Patient not taking: Reported on 10/21/2016) 60 tablet 0  . lidocaine (LIDODERM) 5 % Place 1 patch onto the skin daily. Remove & Discard patch within 12 hours or as directed by MD (Patient not taking: Reported on 10/21/2016) 30 patch 0  . metFORMIN (GLUCOPHAGE) 1000 MG tablet Take 1 tablet (1,000 mg total) by mouth 2 (two) times daily with a meal. (Patient not taking: Reported on 10/21/2016) 60 tablet 0  . montelukast (SINGULAIR) 10 MG tablet Take 1 tablet (10 mg total) by mouth at bedtime. For Asthma (Patient not  taking: Reported on 10/21/2016) 30 tablet 0  . simvastatin (ZOCOR) 40 MG tablet Take 1 tablet (40 mg total) by mouth at bedtime. For high cholesterol (Patient not taking: Reported on 10/21/2016) 15 tablet 0    Musculoskeletal: Strength & Muscle Tone: within normal limits Gait & Station: normal Patient leans: [name]  Psychiatric Specialty Exam: Physical Exam  Nursing note and vitals reviewed. Constitutional: He appears well-developed and well-nourished.  HENT:  Head: Normocephalic and atraumatic.  Eyes: Conjunctivae are normal. Pupils are equal, round, and reactive to light.  Neck: Normal range of motion.  Cardiovascular: Regular rhythm and normal heart sounds.   Respiratory: Effort normal. No respiratory distress.  GI: Soft.  Musculoskeletal: Normal range of motion.  Neurological: He is alert.  Skin: Skin is warm and dry.  Psychiatric: His affect is blunt. His speech is delayed. He is slowed. Thought content is paranoid. Cognition and memory are impaired. He expresses impulsivity and inappropriate judgment. He expresses suicidal ideation.    Review of Systems  Constitutional: Negative.   HENT: Negative.   Eyes: Negative.   Respiratory: Negative.   Cardiovascular: Negative.   Gastrointestinal: Negative.   Musculoskeletal: Negative.   Skin: Negative.   Neurological: Negative.   Psychiatric/Behavioral: Positive for depression, hallucinations and suicidal ideas. Negative for memory loss and substance abuse. The patient is nervous/anxious. The patient does not have insomnia.     Blood pressure 120/73, pulse 72, temperature 98.6 F (37 C), temperature source Oral, resp. rate 20, height 5\' 9"  (1.753 m), weight 77.1 kg (170 lb), SpO2 100 %.Body mass index is 25.1 kg/m.  General Appearance: Disheveled  Eye Contact:  Fair  Speech:  Slow  Volume:  Decreased  Mood:  Depressed  Affect:  Congruent  Thought Process:  Goal Directed  Orientation:  Full (Time, Place, and Person)  Thought  Content:  Illogical, Paranoid Ideation and Rumination  Suicidal Thoughts:  Yes.  with intent/plan  Homicidal Thoughts:  No  Memory:  Immediate;   Good Recent;   Fair Remote;   Fair  Judgement:  Fair  Insight:  Shallow  Psychomotor Activity:  Decreased  Concentration:  Concentration: Fair  Recall:  of Knowledge:  Fair  Language:  Fair  Akathisia:  No  Handed:  Right  AIMS (if indicated):     Assets:  Desire for Improvement Social Support  ADL's:  Intact  Cognition:  Impaired,  Mild  Sleep:        Treatment Plan Summary: Daily contact with patient to assess and evaluate symptoms and progress in treatment, Medication management and Plan Rather than continuing with the indefinite plan awaiting for a longer term hospital I think that ECT has the potential to improve his condition to where he may BE discharge verbal more than he has been before. Therefore I'm going to recommend we admitted him to the inpatient unit again downstairs. Case reviewed  with TTS and emergency room doctor. Patient is agreeable to the plan. No change to his medicine. His blood sugars are under fairly reasonable control for him. Will be completed. I'm hoping we will have a bed available.  Disposition: Recommend psychiatric Inpatient admission when medically cleared. Discussed crisis plan, support from social network, calling 911, coming to the Emergency Department, and calling Suicide Hotline.  Mordecai Rasmussen, MD 11/06/2016 4:39 PM

## 2016-11-06 NOTE — ED Provider Notes (Signed)
-----------------------------------------   7:17 AM on 11/06/2016 -----------------------------------------   Blood pressure 120/73, pulse 72, temperature 98.6 F (37 C), temperature source Oral, resp. rate 20, height 5\' 9"  (1.753 m), weight 77.1 kg (170 lb), SpO2 100 %.  The patient had no acute events since last update.  [facility] No [facility] events.   , MD 11/06/16 [phone]

## 2016-11-06 NOTE — Progress Notes (Signed)
MEDICATION RELATED CONSULT NOTE   Pharmacy Consult for Clozapine Indication: Schizophrenia  No Known Allergies  Patient Measurements: Height: 5\' 9"  (175.3 cm) Weight: 170 lb (77.1 kg) IBW/kg (Calculated) : 70.7   Vital Signs:   Intake/Output from previous day: No intake/output data recorded. Intake/Output from this shift: No intake/output data recorded.  Labs:  Recent Labs  11/04/16 1654  WBC 7.2  HGB 14.9  HCT 42.5  PLT 182   Estimated Creatinine Clearance: 119.1 mL/min (by C-G formula based on SCr of 0.69 mg/dL).  Medical History: Past Medical History:  Diagnosis Date  . Anxiety   . Asthma   . Diabetes mellitus   . High blood pressure   . Sinus complaint     Medications:  Scheduled:  . cloZAPine  125 mg Oral QPC supper  . fluvoxaMINE  50 mg Oral QPC supper  . insulin aspart  0-15 Units Subcutaneous TID WC  . insulin aspart  5 Units Subcutaneous TID WC  . insulin glargine  42 Units Subcutaneous QHS  . lisinopril  20 mg Oral Daily  . meloxicam  7.5 mg Oral BID PC  . metFORMIN  1,000 mg Oral BID WC  . montelukast  10 mg Oral QHS  . polyethylene glycol  17 g Oral Daily  . propranolol  20 mg Oral BID  . simvastatin  40 mg Oral q1800     Plan:  Clozapine eligible for dispensing per [facility] site. Dr. 11/06/16  Mordecai Rasmussen Zip [phone] Initial ANC 2700 on 5/29.    ANC 5/29  2700 ANC 6/6    3800 ANC 6/13  3600  Next labs due 6/20.  Levy Wellman K, RPh 11/06/2016,10:56 AM

## 2016-11-06 NOTE — ED Notes (Signed)
Patient ate supper, drink beverage,and took po medications. Patient is calm and cooperative, will continue to monitor, q 15 minute checks and camera surveillance in progress.

## 2016-11-06 NOTE — ED Notes (Signed)
Report was called to Lanora Manis, RN on BMU.

## 2016-11-07 LAB — GLUCOSE, CAPILLARY
GLUCOSE-CAPILLARY: 291 mg/dL — AB (ref 65–99)
Glucose-Capillary: 191 mg/dL — ABNORMAL HIGH (ref 65–99)
Glucose-Capillary: 278 mg/dL — ABNORMAL HIGH (ref 65–99)
Glucose-Capillary: 235 mg/dL — ABNORMAL HIGH (ref 65–99)

## 2016-11-07 MED ORDER — NICOTINE 7 MG/24HR TD PT24
7.0000 mg | MEDICATED_PATCH | Freq: Every day | TRANSDERMAL | Status: DC
  Administered 2016-11-07 – 2016-12-08 (×18): 7 mg via TRANSDERMAL
  Filled 2016-11-07 (×20): qty 1

## 2016-11-07 NOTE — Progress Notes (Signed)
ADMISSION NOTE: Patient is an 41 year old male admitted to the unit from [facility].  Patient is alert and oriented to person and time.  Patient appears animated, calm and mood appropriate to situation.  Plan of care reviewed with patient.  Consent for treatment signed.  Minimal response and interaction during assessment.  Currently denies suicidal ideation.  Reports auditory hallucination at times.  Skin assessment completed. Skin abrasion noted on right hip.  Personal belonging searched.  No contraband found.  Routine safety checks maintained.  Patient is safe on the unit.  Snacks offered and given. Patient oriented to the unit, staff and room.

## 2016-11-07 NOTE — Progress Notes (Signed)
Patient was visible on the unit at times for meals and snack, but was withdrawn to room. Patient denies SI/HI/AH/VH. Patient is alert and oriented x 4, breathing unlabored, and extremities x 4 within normal limits. Patient is calm and cooperative. Patient did not display any disruptive behavior.  Will continue to monitor patient and notify MD of any changes.

## 2016-11-07 NOTE — [facility] Group Notes (Signed)
[facility] LCSW Group Therapy  11/07/2016 2:28 PM  Type of Therapy:  Group Therapy  Participation Level:  Patient did not attend group. CSW invited patient to group.   Summary of Progress/Problems: Coping Skills: Patients defined and discussed healthy coping skills. Patients identified healthy coping skills they would like to try during hospitalization and after discharge. CSW offered insight to varying coping skills that may have been new to patients such as practicing mindfulness.  Rafi Kenneth G. Garnette Czech MSW, LCSWA 11/07/2016, 2:29 PM

## 2016-11-07 NOTE — H&P (Addendum)
Psychiatric Admission Assessment Adult  Patient Identification: Tony Cunningham MRN:  [id number] Date of Evaluation:  11/07/2016 Chief Complaint:  schizo Principal Diagnosis: <principal problem not specified> Diagnosis:   Patient Active Problem List   Diagnosis Date Noted  . Schizoaffective disorder, depressive type (HCC) [F25.1] 09/23/2016  . Polysubstance abuse [F19.10] 07/17/2016  . Tardive dyskinesia [G24.01] 07/16/2016  . Tobacco use disorder [F17.200] 07/07/2016  . Dyslipidemia [E78.5] 07/07/2016  . Asthma [J45.909] 07/07/2016  . HTN (hypertension) [I10] 07/06/2016  . Diabetes (HCC) [E11.9] 12/25/2010   History of Present Illness:  [name] is a 41 year old male with prior diagnosis of schizoaffective disorder who initially came to the emergency room endorsing feelings of paranoia and vague suicidal thoughts as he did not feel safe living in a group home. The patient is endorsing some very vague paranoid thoughts and psychotic symptoms including auditory and visual hallucinations. When sitting in the room with this writer, he felt like there were objects coming out of the painting on the wall. He was reporting visual hallucinations but could not describe what the voices were saying. He did appear to be responding to internal stimuli at times. The patient was withdrawn and thought processes were disorganized. He has been calm and cooperative with no agitation or aggressive behavior. He does report some depressive symptoms but did not verbalize any details. Affect appeared sad and speech was minimal. Eye contact is poor. He denied any current active suicidal thoughts but did endorse some passive suicidal thoughts in the emergency room. He says he did not feel safe being outside of the hospital. He discussed starting ECT treatment in the emergency room before admission. He denies any problems with insomnia or appetite. It does appear as if he has been compliant with medications prior to  admission.  Past psychiatric history: The patient has a long history of chronic mental illness and has been on multiple different psychotropic medications in the past. He has had a difficult time finding a medication that he tolerates and benefits from. The patient has had some self injury in the past. He is followed by Dr. Mila Palmer at [facility] in [place].  Social history: The patient was born and raised in [place] and raised by both by both his biological parents. He did not answer questions when asked about prior physical or sexual abuse. He finished the 11th grade. He never got his GED. He has been on disability for several years for chronic mental illness. He is single and has never been married and has no children. He is currently living in a group home.  Substance abuse history: The patient denies any history of any heavy alcohol use or illicit drug use but records indicate a prior history of polysubstance abuse. Toxicology screen was positive for benzos but negative for all other substances. He does smoke about 11 pack of cigarettes per day for over 10 years.  Family Psychiatric History The patient denies any history of any mental illness or substance use in the family.  Legal history: He denies any prior arrest or incarcerations.  Associated Signs/Symptoms: Depression Symptoms:  depressed mood, insomnia, psychomotor retardation, difficulty concentrating, hopelessness, recurrent thoughts of death, anxiety, (Hypo) Manic Symptoms:  Distractibility, Labiality of Mood, Anxiety Symptoms:  Excessive Worry, Psychotic Symptoms:  Hallucinations: Auditory and Visual  PTSD Symptoms: NA Total Time spent with patient: 1 hour   There is a history of cutting in the past  Is the patient at risk to self? Yes.    Has the patient  been a risk to self in the past 6 months? Yes.    Has the patient been a risk to self within the distant past? Yes.    Is the patient a risk to others? No.  Has the  patient been a risk to others in the past 6 months? No.  Has the patient been a risk to others within the distant past? No.    Alcohol Screening: Patient refused Alcohol Screening Tool: Yes 1. How often do you have a drink containing alcohol?: Never 3. How often do you have six or more drinks on one occasion?: Never 4. How often during the last year have you found that you were not able to stop drinking once you had started?: Never 5. How often during the last year have you failed to do what was normally expected from you becasue of drinking?: Never 6. How often during the last year have you needed a first drink in the morning to get yourself going after a heavy drinking session?: Never 7. How often during the last year have you had a feeling of guilt of remorse after drinking?: Never 8. How often during the last year have you been unable to remember what happened the night before because you had been drinking?: Never 9. Have you or someone else been injured as a result of your drinking?: No 10. Has a relative or friend or a doctor or another health worker been concerned about your drinking or suggested you cut down?: No Alcohol Use Disorder Identification Test Final Score (AUDIT): 0 Brief Intervention: Patient declined brief intervention  Past Medical History:  Past Medical History:  Diagnosis Date  . Anxiety   . Asthma   . Diabetes mellitus   . High blood pressure   . Sinus complaint    History reviewed. No pertinent surgical history.   Tobacco Screening: Have you used any form of tobacco in the last 30 days? (Cigarettes, Smokeless Tobacco, Cigars, and/or Pipes): Patient Refused Screening Counseled patient on smoking cessation including recognizing danger situations, developing coping skills and basic information about quitting provided: Refused/Declined practical counseling    History  Alcohol Use No     History  Drug Use No     Allergies:  No Known Allergies   Lab  Results:  Results for orders placed or performed during the hospital encounter of 11/06/16 (from the past 48 hour(s))  Glucose, capillary     Status: Abnormal   Collection Time: 11/07/16  7:24 AM  Result Value Ref Range   Glucose-Capillary 191 (H) 65 - 99 mg/dL   Comment 1 Notify RN     Blood Alcohol level:  Lab Results  Component Value Date   ETH <5 10/21/2016   ETH <5 10/07/2016    Metabolic Disorder Labs:  Lab Results  Component Value Date   HGBA1C 11.2 (H) 10/09/2016   MPG 275 10/09/2016   MPG 275 09/24/2016   Lab Results  Component Value Date   PROLACTIN 24.5 (H) 09/24/2016   PROLACTIN 3.4 (L) 07/07/2016   Lab Results  Component Value Date   CHOL 127 10/09/2016   TRIG 80 10/09/2016   HDL 39 (L) 10/09/2016   CHOLHDL 3.3 10/09/2016   VLDL 16 10/09/2016   LDLCALC 72 10/09/2016   LDLCALC 56 07/07/2016    Current Medications: Current Facility-Administered Medications  Medication Dose Route Frequency Provider Last Rate Last Dose  . acetaminophen (TYLENOL) tablet 650 mg  650 mg Oral Q6H PRN [name], [name], MD      .  alum & mag hydroxide-simeth (MAALOX/MYLANTA) 200-200-20 MG/5ML suspension 30 mL  30 mL Oral Q4H PRN [name], [name], MD      . cloZAPine (CLOZARIL) tablet 150 mg  150 mg Oral QPC supper [name], [name], MD      . fluvoxaMINE (LUVOX) tablet 50 mg  50 mg Oral BID [name], [name], MD   50 mg at 11/07/16 0749  . insulin aspart (novoLOG) injection 0-15 Units  0-15 Units Subcutaneous TID WC [name], [name], MD   3 Units at 11/07/16 0749  . insulin aspart (novoLOG) injection 5 Units  5 Units Subcutaneous TID WC [name], [name], MD   5 Units at 11/07/16 0744  . insulin glargine (LANTUS) injection 42 Units  42 Units Subcutaneous QHS [name], [name], MD      . lisinopril (PRINIVIL,ZESTRIL) tablet 20 mg  20 mg Oral Daily [name], [name], MD   20 mg at 11/07/16 0749  . magnesium hydroxide (MILK OF MAGNESIA) suspension 30 mL  30 mL Oral Daily PRN [name], [name], MD       . meloxicam (MOBIC) tablet 7.5 mg  7.5 mg Oral BID PC [name], [name], MD   7.5 mg at 11/07/16 1610  . metFORMIN (GLUCOPHAGE) tablet 1,000 mg  1,000 mg Oral BID WC [name], [name], MD   1,000 mg at 11/07/16 0744  . montelukast (SINGULAIR) tablet 10 mg  10 mg Oral QHS [name], [name], MD      . nicotine (NICODERM CQ - dosed in mg/24 hr) patch 7 mg  7 mg Transdermal Daily Darliss Ridgel, MD      . polyethylene glycol (MIRALAX / GLYCOLAX) packet 17 g  17 g Oral Daily [name], [name], MD   17 g at 11/07/16 0744  . promethazine (PHENERGAN) tablet 12.5 mg  12.5 mg Oral Q6H PRN [name], [name], MD      . propranolol (INDERAL) tablet 20 mg  20 mg Oral BID [name], [name], MD   20 mg at 11/07/16 0744  . simvastatin (ZOCOR) tablet 40 mg  40 mg Oral q1800 [name], [name], MD       PTA Medications: Prescriptions Prior to Admission  Medication Sig Dispense Refill Last Dose  . cloZAPine (CLOZARIL) 100 MG tablet Take 1 tablet (100 mg total) by mouth daily. Daily at 1700 30 tablet 0 unknown at unknown  . cyclobenzaprine (FLEXERIL) 10 MG tablet Take 1 tablet (10 mg total) by mouth 3 (three) times daily as needed for muscle spasms. (Patient not taking: Reported on 10/21/2016) 60 tablet 0 Not Taking at Unknown time  . fluvoxaMINE (LUVOX) 50 MG tablet Take 1 tablet (50 mg total) by mouth daily. Daily at 1700 30 tablet 0 uknown at unknown  . insulin aspart (NOVOLOG) 100 UNIT/ML injection Inject 5 Units into the skin 3 (three) times daily with meals. 5 mL 0 unknown at unknown  . insulin glargine (LANTUS) 100 UNIT/ML injection Inject 0.42 mLs (42 Units total) into the skin at bedtime. 13 mL 0 unknown at unknown  . ipratropium (ATROVENT) 0.06 % nasal spray Place 1 spray into both nostrils daily after supper. 3 mL 0 unknown at unknown  . lidocaine (LIDODERM) 5 % Place 1 patch onto the skin daily. Remove & Discard patch within 12 hours or as directed by MD (Patient not taking: Reported on 10/21/2016) 30 patch 0 Not Taking   . lisinopril (PRINIVIL,ZESTRIL) 20 MG tablet Take 1 tablet (20 mg total) by mouth  daily. 30 tablet 0 unknown at unknown  . meloxicam (MOBIC) 7.5 MG tablet Take 1 tablet (7.5 mg total) by mouth 2 (two) times daily. 60 tablet 0 unknowun at unknown  . metFORMIN (GLUCOPHAGE) 1000 MG tablet Take 1 tablet (1,000 mg total) by mouth 2 (two) times daily with a meal. (Patient not taking: Reported on 10/21/2016) 60 tablet 0 Not Taking at Unknown time  . montelukast (SINGULAIR) 10 MG tablet Take 1 tablet (10 mg total) by mouth at bedtime. For Asthma (Patient not taking: Reported on 10/21/2016) 30 tablet 0 Not Taking at Unknown time  . polyethylene glycol (MIRALAX / GLYCOLAX) packet Take 17 g by mouth daily. 30 each 0 unknown at unknown  . propranolol (INDERAL) 20 MG tablet Take 1 tablet (20 mg total) by mouth 2 (two) times daily. 60 tablet 0 unknown at unknown  . simvastatin (ZOCOR) 40 MG tablet Take 1 tablet (40 mg total) by mouth at bedtime. For high cholesterol (Patient not taking: Reported on 10/21/2016) 15 tablet 0 Not Taking at Unknown time    Musculoskeletal: Strength & Muscle Tone: within normal limits Gait & Station: normal Patient leans: [name]  Psychiatric Specialty Exam: Physical Exam  Constitutional: He is oriented to person, place, and time.  HENT:  Head: Normocephalic and atraumatic.  Eyes: Conjunctivae and EOM are normal. Pupils are equal, round, and reactive to light. No scleral icterus.  Neck: Normal range of motion. Neck supple. No tracheal deviation present. No thyromegaly present.  Cardiovascular: Normal rate and regular rhythm.   Respiratory: Effort normal and breath sounds normal. No respiratory distress. He has no wheezes.  GI: Soft. Bowel sounds are normal. He exhibits no distension. There is no tenderness. There is no rebound.  Musculoskeletal: Normal range of motion. He exhibits no edema or deformity.  Lymphadenopathy:    He has no cervical adenopathy.  Neurological: He is alert  and oriented to person, place, and time.  Skin: Skin is warm and dry. No rash noted.    Review of Systems  Constitutional: Negative.  Negative for chills, fever and weight loss.  HENT: Negative.  Negative for congestion, hearing loss, nosebleeds and tinnitus.   Eyes: Negative.  Negative for blurred vision, double vision and photophobia.  Respiratory: Negative.  Negative for cough, hemoptysis, sputum production and wheezing.   Cardiovascular: Negative.  Negative for chest pain and palpitations.  Gastrointestinal: Negative.  Negative for abdominal pain, diarrhea, heartburn, nausea and vomiting.  Genitourinary: Negative.  Negative for dysuria, frequency and urgency.  Musculoskeletal: Negative.  Negative for back pain, myalgias and neck pain.  Skin: Negative.  Negative for itching and rash.  Neurological: Negative.  Negative for dizziness, tingling, tremors, focal weakness and headaches.  Endo/Heme/Allergies: Negative.  Negative for environmental allergies. Does not bruise/bleed easily.    Blood pressure 117/74, pulse 71, temperature 98.8 F (37.1 C), temperature source Oral, resp. rate 18, height 5\' 8"  (1.727 m), weight 78 kg (172 lb).Body mass index is 26.15 kg/m.  General Appearance: Fairly Groomed  Eye Contact:  Fair  Speech:  Slow  Volume:  Decreased  Mood:  Anxious and Dysphoric  Affect:  Blunt  Thought Process:  Linear and Descriptions of Associations: Intact  Orientation:  Full (Time, Place, and Person)  Thought Content:  Hallucinations: Auditory Visual  Suicidal Thoughts:  Vague suicidal thoughts, no plan  Homicidal Thoughts:  No  Memory:  Immediate;   Poor Recent;   Poor Remote;   Poor  Judgement:  Poor  Insight:  Shallow  Psychomotor Activity:  Decreased  Concentration:  Concentration: Poor and Attention Span: Poor  Recall:  Poor  Fund of Knowledge:  Poor  Language:  Fair  Akathisia:  No  Handed:    AIMS (if indicated):     Assets:  Manufacturing systems engineer Housing   ADL's:  Intact  Cognition:  Impaired,  Mild  Sleep:  Number of Hours: 5.3    Treatment Plan Summary:    [name] 41 year old male with prior diagnosis of schizoaffective disorder who presented to the emergency room with vague suicidal thoughts as well as paranoid thoughts. He does. Be responding to internal stimuli and has endorsed some auditory and visual hallucinations. He will be admitted to inpatient psychiatry for medication management, safety and stabilization.  Schizoaffective disorder, depressive type: Clozaril was started and dosage was increased to a total of 150 mg by mouth nightly ptosis. He will continue on Luvox 50 mg by mouth twice a day. The patient also has Inderal 20 mg by mouth twice a day for restlessness and anxiety. Will check EKG for QTc prolongation as well as hemoglobin A1c and lipid panel.  Questionable history of polysubstance abuse: The patient denies any history of any polysubstance use but records indicate that there are may have been a history of polysubstance use. He was advised to abstain from alcohol and all illicit drugs as it may worsen anxiety mood symptoms as well as psychosis. The patient is not interested in any substance abuse treatment.  Nicotine use disorder: Will offer the patient and nicotine patch.  Diabetes: The patient will be placed on a low-carb diet. Continue sliding scale insulin and Lantus 42 units at bedtime. The patient also has NovoLog 5 units 3 times a day with meals. We'll continue metformin 1000 mg by mouth twice a day.  Hypertension: Vital signs are stable. We'll continue lisinopril 20 mg by mouth daily.  Seasonal allergies: Continue Singulair 10 mg by mouth nightly  Disposition: The patient will return to a group home. He will need psychotropic medication management follow-up appointment at [facility] in [place]. Consider ACT team.  Physician Treatment Plan for Primary Diagnosis: Psychosis Long Term Goal(s): Improvement in symptoms  so as ready for discharge  Short Term Goals: Ability to verbalize feelings will improve, Ability to demonstrate self-control will improve and Ability to identify and develop effective coping behaviors will improve  Physician Treatment Plan for Secondary Diagnosis: Active Problems:   Schizoaffective disorder, depressive type (HCC)  Long Term Goal(s): Improvement in symptoms so as ready for discharge  Short Term Goals: Ability to identify and develop effective coping behaviors will improve and Ability to identify triggers associated with substance abuse/mental health issues will improve  I certify that inpatient services furnished can reasonably be expected to improve the patient's condition.    Levora Angel, MD 6/16/201810:33 AM

## 2016-11-07 NOTE — Plan of Care (Signed)
Problem: Safety: Goal: Periods of time without injury will increase Outcome: Progressing Patient remain safe and without injury and continue to be monitored on Q15 minute checks.

## 2016-11-07 NOTE — BHH Counselor (Signed)
Adult Comprehensive Assessment  Patient ID: Osman Calzadilla, male   DOB: 1975/08/09, 41 y.o.   MRN: [id number]  Information Source: Information source: Patient  Current Stressors:  Educational / Learning stressors: n/a Employment / Job issues: Pt is on disability. Family Relationships: n/a Surveyor, quantity / Lack of resources (include bankruptcy): n/a Housing / Lack of housing: CSW still assessing appropriate discharge plan. Patient was formerly in group home.  Physical health (include injuries & life threatening diseases): diabetes, hypertension, dyslipidemia, asthma, and tardive dyskinesia.  Social relationships: n/a Substance abuse: n/a Bereavement / Loss: n/a   Living/Environment/Situation:  Living Arrangements: Group Home Living conditions (as described by patient or guardian): Fine How long has patient lived in current situation?: 3 months What is atmosphere in current home: unknown  Family History:  Marital status: Single Are you sexually active?: Yes What is your sexual orientation?: heterosexual Has your sexual activity been affected by drugs, alcohol, medication, or emotional stress?: none Does patient have children?: No  Childhood History:  By whom was/is the patient raised?: Both parents Additional childhood history information: Pt reports having a good childhood.  No issues reported.  Description of patient's relationship with caregiver when they were a child: Pt reports being closer to his father.  Patient's description of current relationship with people who raised him/her: Pt sees his parents weekly.  Pt reports he gets along with his parents. How were you disciplined when you got in trouble as a child/adolescent?: appropriate discipline Does patient have siblings?: Yes Number of Siblings: 3 Description of patient's current relationship with siblings: 2 brothers, 1 sister - Pt reports being close to 1 brother and his sister.   Did patient suffer any  verbal/emotional/physical/sexual abuse as a child?: No Did patient suffer from severe childhood neglect?: No Has patient ever been sexually abused/assaulted/raped as an adolescent or adult?: No Was the patient ever a victim of a crime or a disaster?: No Witnessed domestic violence?: No Has patient been effected by domestic violence as an adult?: No  Education:  Highest grade of school patient has completed: 11 Currently a Consulting civil engineer?: No Learning disability?:  (unsure)  Employment/Work Situation:   Employment situation: On disability Why is patient on disability: mental health How long has patient been on disability: "all my life" Patient's job has been impacted by current illness: No What is the longest time patient has a held a job?: [employer] Where was the patient employed at that time?: 8-11 years Has patient ever been in the [employer]?: No Are There Guns or Other Weapons in Your Home?: No  Financial Resources:   Surveyor, quantity resources: Writer Does patient have a Lawyer or guardian?: No  Alcohol/Substance Abuse:   What has been your use of drugs/alcohol within the last 12 months?: Pt denies any alcohol or drug use If attempted suicide, did drugs/alcohol play a role in this?: No Alcohol/Substance Abuse Treatment Hx: Denies past history Has alcohol/substance abuse ever caused legal problems?: No  Social Support System:   Conservation officer, nature Support System: Fair Museum/gallery exhibitions officer System: mom Type of faith/religion: Scientist, research (life sciences):   Leisure and Hobbies: video games  Strengths/Needs:   What things does the patient do well?: Friendly, resilient In what areas does patient struggle / problems for patient: depression, passive suicidal thoughts.   Discharge Plan:   Does patient have access to transportation?: Yes Will patient be returning to same living situation after discharge?: CSW still assessing appropriate  discharge plan.  Currently receiving community mental health  services: Yes (From Whom) [facility] in [place].  Does patient have financial barriers related to discharge medications?: No  Summary/Recommendations:   Patient is a 41 year old male admitted involuntarily with a diagnosis of Schizoaffective disorder, depressive type. Information was obtained from psychosocial assessment completed with patient and chart review conducted by this evaluator. Patient presented to the hospital on 10/21/2016 from group home after recent discharge on 10/20/2016 from inpatient behavioral unit. Patient reports primary triggers for admission were auditory hallucinations telling him to cut his wrist. Patient has been inpatient in the ED behavioral unit since 10/21/2016 and was on wait list for [facility]. Patient was admitted to inpatient behavioral medicine unit on 11/06/2016 for ECT treatment. Patient will benefit from crisis stabilization, medication evaluation, group therapy and psycho education in addition to case management for discharge. At discharge, it is recommended that patient remain compliant with established discharge plan and continued treatment.    Mehreen Azizi G. Garnette Czech MSW, [facility] 11/07/2016 11:57 AM
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
## 2016-11-08 LAB — GLUCOSE, CAPILLARY
GLUCOSE-CAPILLARY: 110 mg/dL — AB (ref 65–99)
GLUCOSE-CAPILLARY: 133 mg/dL — AB (ref 65–99)
GLUCOSE-CAPILLARY: 169 mg/dL — AB (ref 65–99)
Glucose-Capillary: 414 mg/dL — ABNORMAL HIGH (ref 65–99)

## 2016-11-08 MED ORDER — CLOZAPINE 100 MG PO TABS
200.0000 mg | ORAL_TABLET | Freq: Every day | ORAL | Status: DC
  Administered 2016-11-08 – 2016-11-09 (×2): 200 mg via ORAL
  Filled 2016-11-08 (×2): qty 2
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
## 2016-11-08 NOTE — Progress Notes (Signed)
Pt appeared to sleep 8-9 hours while monitored on 15 minute safety checks.

## 2016-11-08 NOTE — Progress Notes (Signed)
D:Pt isolative and withdrawn to his bed except when pt's went outside.  Pt has poor hygiene and is malodorous.  Pt is delayed in conversation, flat affect and appears preoccupied.  Needing some prompts to find his own room but was cooperative. Pt denies current s/i or h/i.   A:Pt appears to have low energy, and is isolative. Monitored on 15 minute safety checks and maintained safety on the unit. R: Pt unchanged in energy.  Monitor safety and cont tx plan.

## 2016-11-08 NOTE — Progress Notes (Signed)
Pt denies SI, HI, a/v hallucinations. Pt stated , " I'm feeling better". He commits to safety. Pt remains malodorous encouraged by staff to perform hygiene independently. Pt is withdrawn to room. He is compliant with all medications. Will continue to monitor.

## 2016-11-08 NOTE — Plan of Care (Signed)
Problem: Safety: Goal: Ability to remain free from injury will improve Outcome: Progressing Pt remains free from injury commits to safety.    

## 2016-11-08 NOTE — [facility] Group Notes (Signed)
[facility] Group Notes:  (Nursing/MHT/Case Management/Adjunct)  Date:  11/08/2016  Time:  10:34 PM  Type of Therapy:  Evening Wrap-up Group  Participation Level:  Did Not Attend  Participation Quality:  [name]  Affect:  [name]  Cognitive:  [name]  Insight:  None  Engagement in Group:  Did Not Attend  Modes of Intervention:  Activity and Discussion  Summary of Progress/Problems:  Tony Cunningham 11/08/2016, 10:34 PM

## 2016-11-08 NOTE — Progress Notes (Signed)
Patient found by staff drinking an Ensure that he has not been prescribed.  CBG 414.  MD notified.  Recommended to give night time Lantus.  No new orders given.

## 2016-11-08 NOTE — [facility] Group Notes (Signed)
[facility] LCSW Group Therapy  11/08/2016 2:03 PM  Type of Therapy:  Group Therapy  Participation Level:  Patient did not attend group. CSW invited patient to group.   Summary of Progress/Problems: Goal Setting: The objective is to set goals as they relate to the crisis in which they were admitted. Patients will be using SMART goal modalities to set measurable goals. Characteristics of realistic goals will be discussed and patients will be assisted in setting and processing how one will reach their goal. Facilitator will also assist patients in applying interventions and coping skills learned in psycho-education groups to the SMART goal and process how one will achieve defined goal.  General Wearing G. Garnette Czech MSW, LCSWA 11/08/2016, 2:03 PM

## 2016-11-09 LAB — GLUCOSE, CAPILLARY
GLUCOSE-CAPILLARY: 253 mg/dL — AB (ref 65–99)
GLUCOSE-CAPILLARY: 318 mg/dL — AB (ref 65–99)
GLUCOSE-CAPILLARY: 396 mg/dL — AB (ref 65–99)
GLUCOSE-CAPILLARY: 224 mg/dL — AB (ref 65–99)
Glucose-Capillary: 76 mg/dL (ref 65–99)

## 2016-11-09 LAB — LIPID PANEL
Cholesterol: 92 mg/dL (ref 0–200)
HDL: 39 mg/dL — AB (ref >40)
LDL CALC: 42 mg/dL (ref 0–99)
Total CHOL/HDL Ratio: 2.4 RATIO
Triglycerides: 54 mg/dL (ref <150)
VLDL: 11 mg/dL (ref 0–40)

## 2016-11-09 MED ORDER — CLOZAPINE 25 MG PO TABS
225.0000 mg | ORAL_TABLET | Freq: Every day | ORAL | Status: DC
  Administered 2016-11-10 – 2016-11-15 (×6): 225 mg via ORAL
  Filled 2016-11-09 (×6): qty 2

## 2016-11-09 NOTE — [facility] Group Notes (Signed)
[facility] LCSW Group Therapy Note  Date/Time: 11/09/16, 1300  Type of Therapy and Topic:  Group Therapy:  Overcoming Obstacles  Participation Level:  Pt did not attend group.  Description of Group:    In this group patients will be encouraged to explore what they see as obstacles to their own wellness and recovery. They will be guided to discuss their thoughts, feelings, and behaviors related to these obstacles. The group will process together ways to cope with barriers, with attention given to specific choices patients can make. Each patient will be challenged to identify changes they are motivated to make in order to overcome their obstacles. This group will be process-oriented, with patients participating in exploration of their own experiences as well as giving and receiving support and challenge from other group members.  Therapeutic Goals: 1. Patient will identify personal and current obstacles as they relate to admission. 2. Patient will identify barriers that currently interfere with their wellness or overcoming obstacles.  3. Patient will identify feelings, thought process and behaviors related to these barriers. 4. Patient will identify two changes they are willing to make to overcome these obstacles:    Summary of Patient Progress      Therapeutic Modalities:   Cognitive Behavioral Therapy Solution Focused Therapy Motivational Interviewing Relapse Prevention Therapy  Daleen [employer]ibb, LCSW

## 2016-11-09 NOTE — Progress Notes (Signed)
   11/09/16 1055  Clinical Encounter Type  Visited With Patient;Health care provider  Visit Type Initial;Spiritual support;Behavioral Health (Care Team)   Chaplain present during Care Team meeting. Patient will request services if needed.

## 2016-11-09 NOTE — Plan of Care (Signed)
Problem: Nutritional: Goal: Maintenance of adequate nutrition will improve Outcome: Progressing Client will continue to verbalize healthy foods that he can eat.

## 2016-11-09 NOTE — Progress Notes (Signed)
Inpatient Diabetes Program Recommendations  AACE/ADA: New Consensus Statement on Inpatient Glycemic Control (2015)  Target Ranges:  Prepandial:   less than 140 mg/dL      Peak postprandial:   less than 180 mg/dL (1-2 hours)      Critically ill patients:  140 - 180 mg/dL   Lab Results  Component Value Date   GLUCAP 318 (H) 11/09/2016   HGBA1C 11.2 (H) 10/09/2016    Review of Glycemic Control:  Results for RESHAD, SAAB (MRN [id number]) as of 11/09/2016 12:19  Ref. Range 11/07/2016 07:24 11/07/2016 11:44 11/07/2016 16:26 11/07/2016 20:22 11/08/2016 07:07 11/08/2016 11:43 11/08/2016 16:35 11/08/2016 20:33 11/09/2016 06:40 11/09/2016 11:42  Glucose-Capillary Latest Ref Range: 65 - 99 mg/dL 062 (H) 376 (H) 283 (H) 291 (H) 110 (H) 133 (H) 169 (H) 414 (H) 253 (H) 318 (H)    Diabetes history: Type 2 diabetes Outpatient Diabetes medications: Lantus 42 units daily, Novolog 5 units tid with meals Current orders for Inpatient glycemic control:  Lantus 42 units daily, Novolog 5 units tid with meals (hold if patient eats less than 50%), Metformin 1000 mg bid, Novolog moderate tid with meals  Inpatient Diabetes Program Recommendations:    Blood sugars were well controlled on 11/08/16 however increased last night after patient drank ensure per progress note.  Blood sugars remain increased this morning, however patient NPO.  No recommendations today.  Will follow.  Thanks, Beryl Meager, RN, BC-ADM Inpatient Diabetes Coordinator Pager [phone] (8a-5p)

## 2016-11-09 NOTE — Progress Notes (Signed)
Recreation Therapy Notes  Date: 06.18.18 Time: 9:30 am Location: Craft Room  Group Topic: Self-expression  Goal Area(s) Addresses:  Patient will effectively use art as a means of self-expression. Patient will recognize positive benefit of self-expression. Patient will be able to identify one emotion experienced during group session. Patient will identify use of art as a coping skill.  Behavioral Response: Did not attend  Intervention: Two Faces of Me  Activity: Patients were given a blank face worksheet and were instructed to draw a line down the middle. On one side, patients were instructed to draw or write how they felt when they were admitted to the hospital and on the other side they were instructed to draw or write how they wanted to feel when they are d/c.  Education: LRT educated patients on other forms of self-expression.  Education Outcome: Patient did not attend group.  Clinical Observations/Feedback: Patient did not attend group.  Jacquelynn Cree, LRT/CTRS 11/09/2016 10:05 AM

## 2016-11-09 NOTE — Progress Notes (Signed)
Patient present with flat and blunted affect. Up ad lib intermittently with steady gait. This Clinical research associate was informed by previous shift that patient was NPO due to a scheduled ECT procedure this a.m. Upon search of the medical record, no order found for the aforementioned. BS monitored with coverage given as needed. Patient encouraged to bathe, nutrition provided and encouraged. Patient remains safe with q 15 minutes checks. Will be continue to monitor.
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
## 2016-11-09 NOTE — Pre-Procedure Instructions (Signed)
Patient was laying in his bed at the time this nurse arrived to speak with him. Education was provided regarding the procedure of ECT. He notified this nurse that he has previously participated in ECT in another area. He had no question at this time. This nurse emphasized the need for the patient be sure he performed proper hygiene the morning of his procedure. It was also emphasized that he remain NPO after midnight and be sure to have taken his propanolol prior to arriving to ECT. The patient verbalized understanding of the education. He took the ECT booklet and was reading through various pages while this nurse was standing in his room. During the entire conversation the patient remained calm.

## 2016-11-10 ENCOUNTER — Inpatient Hospital Stay: Payer: Medicare Other

## 2016-11-10 LAB — GLUCOSE, CAPILLARY
GLUCOSE-CAPILLARY: 362 mg/dL — AB (ref 65–99)
GLUCOSE-CAPILLARY: 212 mg/dL — AB (ref 65–99)
GLUCOSE-CAPILLARY: 98 mg/dL (ref 65–99)
Glucose-Capillary: 137 mg/dL — ABNORMAL HIGH (ref 65–99)

## 2016-11-10 LAB — CBC WITH DIFFERENTIAL/PLATELET
Basophils Absolute: 0.1 10*3/uL (ref 0–0.1)
Basophils Relative: 1 %
EOS ABS: 0.4 10*3/uL (ref 0–0.7)
EOS PCT: 5 %
HCT: 43 % (ref 40.0–52.0)
Hemoglobin: 15.3 g/dL (ref 13.0–18.0)
LYMPHS ABS: 2.9 10*3/uL (ref 1.0–3.6)
Lymphocytes Relative: 41 %
MCH: 31.4 pg (ref 26.0–34.0)
MCHC: 35.6 g/dL (ref 32.0–36.0)
MCV: 88.1 fL (ref 80.0–100.0)
Monocytes Absolute: 0.4 10*3/uL (ref 0.2–1.0)
Monocytes Relative: 6 %
NEUTROS PCT: 47 %
Neutro Abs: 3.3 10*3/uL (ref 1.4–6.5)
PLATELETS: 169 10*3/uL (ref 150–440)
RBC: 4.88 MIL/uL (ref 4.40–5.90)
RDW: 12.7 % (ref 11.5–14.5)
WBC: 7.1 10*3/uL (ref 3.8–10.6)

## 2016-11-10 LAB — HEMOGLOBIN A1C
HEMOGLOBIN A1C: 10.3 % — AB (ref 4.8–5.6)
MEAN PLASMA GLUCOSE: 249 mg/dL

## 2016-11-10 LAB — COMPREHENSIVE METABOLIC PANEL
ALK PHOS: 55 U/L (ref 38–126)
ALT: 25 U/L (ref 17–63)
AST: 21 U/L (ref 15–41)
Albumin: 3.6 g/dL (ref 3.5–5.0)
Anion gap: 7 (ref 5–15)
BILIRUBIN TOTAL: 0.7 mg/dL (ref 0.3–1.2)
BUN: 14 mg/dL (ref 6–20)
CALCIUM: 9.1 mg/dL (ref 8.9–10.3)
CHLORIDE: 103 mmol/L (ref 101–111)
CO2: 29 mmol/L (ref 22–32)
CREATININE: 0.54 mg/dL — AB (ref 0.61–1.24)
Glucose, Bld: 109 mg/dL — ABNORMAL HIGH (ref 65–99)
Potassium: 4 mmol/L (ref 3.5–5.1)
Sodium: 139 mmol/L (ref 135–145)
TOTAL PROTEIN: 6.4 g/dL — AB (ref 6.5–8.1)

## 2016-11-10 MED ORDER — INSULIN ASPART 100 UNIT/ML ~~LOC~~ SOLN
7.0000 [IU] | Freq: Three times a day (TID) | SUBCUTANEOUS | Status: DC
  Administered 2016-11-10 – 2016-11-13 (×8): 7 [IU] via SUBCUTANEOUS
  Filled 2016-11-10 (×3): qty 1

## 2016-11-10 MED ORDER — INSULIN GLARGINE 100 UNIT/ML ~~LOC~~ SOLN
48.0000 [IU] | Freq: Every day | SUBCUTANEOUS | Status: DC
  Administered 2016-11-10 – 2016-11-11 (×2): 48 [IU] via SUBCUTANEOUS
  Filled 2016-11-10 (×2): qty 0.48

## 2016-11-10 NOTE — [facility] Group Notes (Signed)
[facility] Group Notes:  (Nursing/MHT/Case Management/Adjunct)  Date:  11/10/2016  Time:  12:31 AM  Type of Therapy:  Group Therapy  Participation Level:  Did Not Attend  Participation Quality:   Summary of Progress/Problems:  Kalaysia Demonbreun L Harjit Douds 11/10/2016, 12:31 AM 

## 2016-11-10 NOTE — Progress Notes (Signed)
Patient alert, oriented and responsive. In bed majority of shift, up for meals and medications. Patient educated on the importance of hand washing and bathing to keep down infection. Patient was not receptive of the information provided. Denies SI/HI, AVH. BS monitored with coverage when needed.

## 2016-11-10 NOTE — Tx Team (Signed)
Interdisciplinary Treatment and Diagnostic Plan Update  11/09/16. Late entry note. Time of Session: [place] MRN: [id number]  Principal Diagnosis: <principal problem not specified>  Secondary Diagnoses: Active Problems:   Schizoaffective disorder, depressive type (HCC)   Current Medications:  Current Facility-Administered Medications  Medication Dose Route Frequency Provider Last Rate Last Dose  . acetaminophen (TYLENOL) tablet 650 mg  650 mg Oral Q6H PRN [name], [name], MD      . alum & mag hydroxide-simeth (MAALOX/MYLANTA) 200-200-20 MG/5ML suspension 30 mL  30 mL Oral Q4H PRN [name], [name], MD      . cloZAPine (CLOZARIL) tablet 225 mg  225 mg Oral QPC supper [name], [name], MD      . fluvoxaMINE (LUVOX) tablet 50 mg  50 mg Oral BID [name], Madie Reno, MD   50 mg at 11/10/16 0839  . insulin aspart (novoLOG) injection 0-15 Units  0-15 Units Subcutaneous TID WC [name], Madie Reno, MD   15 Units at 11/10/16 0650  . insulin aspart (novoLOG) injection 5 Units  5 Units Subcutaneous TID WC [name], Madie Reno, MD   5 Units at 11/10/16 [phone]  . insulin glargine (LANTUS) injection 42 Units  42 Units Subcutaneous QHS [name], Madie Reno, MD   42 Units at 11/09/16 2200  . lisinopril (PRINIVIL,ZESTRIL) tablet 20 mg  20 mg Oral Daily [name], Madie Reno, MD   20 mg at 11/10/16 1941  . magnesium hydroxide (MILK OF MAGNESIA) suspension 30 mL  30 mL Oral Daily PRN [name], [name], MD      . meloxicam (MOBIC) tablet 7.5 mg  7.5 mg Oral BID PC [name], [name], MD   7.5 mg at 11/10/16 7408  . metFORMIN (GLUCOPHAGE) tablet 1,000 mg  1,000 mg Oral BID WC [name], Madie Reno, MD   1,000 mg at 11/10/16 1448  . montelukast (SINGULAIR) tablet 10 mg  10 mg Oral QHS [name], [name], MD   10 mg at 11/09/16 2200  . nicotine (NICODERM CQ - dosed in mg/24 hr) patch 7 mg  7 mg Transdermal Daily Chauncey Mann, MD   7 mg at 11/10/16 0837  . polyethylene glycol (MIRALAX / GLYCOLAX) packet 17 g  17 g Oral Daily [name], Madie Reno, MD    17 g at 11/10/16 0836  . promethazine (PHENERGAN) tablet 12.5 mg  12.5 mg Oral Q6H PRN [name], [name], MD      . propranolol (INDERAL) tablet 20 mg  20 mg Oral BID [name], Madie Reno, MD   20 mg at 11/10/16 1856  . simvastatin (ZOCOR) tablet 40 mg  40 mg Oral q1800 [name], Madie Reno, MD   40 mg at 11/09/16 1703   PTA Medications: Prescriptions Prior to Admission  Medication Sig Dispense Refill Last Dose  . cloZAPine (CLOZARIL) 100 MG tablet Take 1 tablet (100 mg total) by mouth daily. Daily at 1700 30 tablet 0 unknown at unknown  . cyclobenzaprine (FLEXERIL) 10 MG tablet Take 1 tablet (10 mg total) by mouth 3 (three) times daily as needed for muscle spasms. (Patient not taking: Reported on 10/21/2016) 60 tablet 0 Not Taking at Unknown time  . fluvoxaMINE (LUVOX) 50 MG tablet Take 1 tablet (50 mg total) by mouth daily. Daily at 1700 30 tablet 0 uknown at unknown  . insulin aspart (NOVOLOG) 100 UNIT/ML injection Inject 5 Units into the skin 3 (three) times daily with meals. 5 mL 0 unknown at unknown  . insulin glargine (LANTUS) 100 UNIT/ML injection Inject  0.42 mLs (42 Units total) into the skin at bedtime. 13 mL 0 unknown at unknown  . ipratropium (ATROVENT) 0.06 % nasal spray Place 1 spray into both nostrils daily after supper. 3 mL 0 unknown at unknown  . lidocaine (LIDODERM) 5 % Place 1 patch onto the skin daily. Remove & Discard patch within 12 hours or as directed by MD (Patient not taking: Reported on 10/21/2016) 30 patch 0 Not Taking  . lisinopril (PRINIVIL,ZESTRIL) 20 MG tablet Take 1 tablet (20 mg total) by mouth daily. 30 tablet 0 unknown at unknown  . meloxicam (MOBIC) 7.5 MG tablet Take 1 tablet (7.5 mg total) by mouth 2 (two) times daily. 60 tablet 0 unknowun at unknown  . metFORMIN (GLUCOPHAGE) 1000 MG tablet Take 1 tablet (1,000 mg total) by mouth 2 (two) times daily with a meal. (Patient not taking: Reported on 10/21/2016) 60 tablet 0 Not Taking at Unknown time  . montelukast (SINGULAIR) 10  MG tablet Take 1 tablet (10 mg total) by mouth at bedtime. For Asthma (Patient not taking: Reported on 10/21/2016) 30 tablet 0 Not Taking at Unknown time  . polyethylene glycol (MIRALAX / GLYCOLAX) packet Take 17 g by mouth daily. 30 each 0 unknown at unknown  . propranolol (INDERAL) 20 MG tablet Take 1 tablet (20 mg total) by mouth 2 (two) times daily. 60 tablet 0 unknown at unknown  . simvastatin (ZOCOR) 40 MG tablet Take 1 tablet (40 mg total) by mouth at bedtime. For high cholesterol (Patient not taking: Reported on 10/21/2016) 15 tablet 0 Not Taking at Unknown time    Patient Stressors:    Patient Strengths:    Treatment Modalities: Medication Management, Group therapy, Case management,  1 to 1 session with clinician, Psychoeducation, Recreational therapy.   Physician Treatment Plan for Primary Diagnosis: <principal problem not specified> Long Term Goal(s): Improvement in symptoms so as ready for discharge Improvement in symptoms so as ready for discharge   Short Term Goals: Ability to verbalize feelings will improve Ability to demonstrate self-control will improve Ability to identify and develop effective coping behaviors will improve Ability to identify and develop effective coping behaviors will improve Ability to identify triggers associated with substance abuse/mental health issues will improve  Medication Management: Evaluate patient's response, side effects, and tolerance of medication regimen.  Therapeutic Interventions: 1 to 1 sessions, Unit Group sessions and Medication administration.  Evaluation of Outcomes: Not Met  Physician Treatment Plan for Secondary Diagnosis: Active Problems:   Schizoaffective disorder, depressive type ([place])  Long Term Goal(s): Improvement in symptoms so as ready for discharge Improvement in symptoms so as ready for discharge   Short Term Goals: Ability to verbalize feelings will improve Ability to demonstrate self-control will  improve Ability to identify and develop effective coping behaviors will improve Ability to identify and develop effective coping behaviors will improve Ability to identify triggers associated with substance abuse/mental health issues will improve     Medication Management: Evaluate patient's response, side effects, and tolerance of medication regimen.  Therapeutic Interventions: 1 to 1 sessions, Unit Group sessions and Medication administration.  Evaluation of Outcomes: Not Met   RN Treatment Plan for Primary Diagnosis: <principal problem not specified> Long Term Goal(s): Knowledge of disease and therapeutic regimen to maintain health will improve  Short Term Goals: Ability to identify and develop effective coping behaviors will improve and Compliance with prescribed medications will improve  Medication Management: RN will administer medications as ordered by provider, will assess and evaluate patient's response and  provide education to patient for prescribed medication. RN will report any adverse and/or side effects to prescribing provider.  Therapeutic Interventions: 1 on 1 counseling sessions, Psychoeducation, Medication administration, Evaluate responses to treatment, Monitor vital signs and CBGs as ordered, Perform/monitor CIWA, COWS, AIMS and Fall Risk screenings as ordered, Perform wound care treatments as ordered.  Evaluation of Outcomes: Not Met   LCSW Treatment Plan for Primary Diagnosis: <principal problem not specified> Long Term Goal(s): Safe transition to appropriate next level of care at discharge, Engage patient in therapeutic group addressing interpersonal concerns.  Short Term Goals: Engage patient in aftercare planning with referrals and resources and Increase skills for wellness and recovery  Therapeutic Interventions: Assess for all discharge needs, 1 to 1 time with Social worker, Explore available resources and support systems, Assess for adequacy in community  support network, Educate family and significant other(s) on suicide prevention, Complete Psychosocial Assessment, Interpersonal group therapy.  Evaluation of Outcomes: Not Met   Progress in Treatment: Attending groups: No. Participating in groups: No. Taking medication as prescribed: Yes. Toleration medication: Yes. Family/Significant other contact made: No, will contact:  when given permission Patient understands diagnosis: Yes. Discussing patient identified problems/goals with staff: Yes. Medical problems stabilized or resolved: Yes. Denies suicidal/homicidal ideation: Yes. Issues/concerns per patient self-inventory: No. Other: none  New problem(s) identified: No, Describe:  none  New Short Term/Long Term Goal(s):Pt was unable to specify a goal.  Discharge Plan or Barriers: Pt will return to ACT team services.  Reason for Continuation of Hospitalization: Depression Medication stabilization  Estimated Length of Stay: 7 days.  Attendees: Patient: Rohail Klees 11/09/16  Physician: Dr. Bary Leriche, MD 11/09/16  Nursing: Elige Radon, RN 11/09/16  RN Care Manager:   Social Worker: Lurline Idol, LCSW 11/09/16  Recreational Therapist: Everitt Amber, LRT/CTRS  11/09/16  Other: Drue Dun, Chaplain 11/09/16  Other:    Other:     Scribe for Treatment Team: Joanne Chars, LCSW 11/10/2016 10:12 AM

## 2016-11-10 NOTE — [facility] Group Notes (Signed)
Goals Group Date/Time: 11/10/2016 9:30 AM Type of Therapy and Topic: Group Therapy: Goals Group: SMART Goals   Participation Level: DID NOT ATTEND   Glennon Mac, LCSW 11/10/2016, 4:37 PM
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
## 2016-11-10 NOTE — Progress Notes (Signed)
Inpatient Diabetes Program Recommendations  AACE/ADA: New Consensus Statement on Inpatient Glycemic Control (2015)  Target Ranges:  Prepandial:   less than 140 mg/dL      Peak postprandial:   less than 180 mg/dL (1-2 hours)      Critically ill patients:  140 - 180 mg/dL   Lab Results  Component Value Date   GLUCAP 362 (H) 11/10/2016   HGBA1C 10.3 (H) 11/09/2016    Review of Glycemic Control: Results for LYDIA, MENG (MRN [id number]) as of 11/10/2016 10:49  Ref. Range 11/09/2016 11:42 11/09/2016 16:52 11/09/2016 21:21 11/09/2016 21:58 11/10/2016 06:36  Glucose-Capillary Latest Ref Range: 65 - 99 mg/dL 387 (H) 564 (H) 76 332 (H) 362 (H)   Diabetes history: Type 2 diabetes Outpatient Diabetes medications: Lantus 42 units q HS, Novolog 5 units tid with meals, Metformin 1000 mg bid Current orders for Inpatient glycemic control:  Novolog moderate tid with meals, Novolog 5 units tid with meals, Lantus 42 units q HS, Metformin 1000 mg bid  Inpatient Diabetes Program Recommendations:   Please consider increasing Lantus to 48 units q HS.  Will follow.  Thanks, Beryl Meager, RN, BC-ADM Inpatient Diabetes Coordinator Pager [phone] (8a-5p)

## 2016-11-10 NOTE — Progress Notes (Signed)
Patient ID: Tony Cunningham, male   DOB: [birth year], 41 y.o.   MRN: [id number] I reviewed his blood sugars. In light of his getting ECT tomorrow morning we don't want him to become hypoglycemic but this morning he still had a blood sugar first thing in the morning well over 300. Based on this I am not going to change his nighttime blood sugar. We will check it in the morning and we can administer IV glucose if necessary.

## 2016-11-10 NOTE — [facility] Group Notes (Signed)
[facility] Group Notes:  (Nursing/MHT/Case Management/Adjunct)  Date:  11/10/2016  Time:  1:47 PM  Type of Therapy:  Psychoeducational Skills   Participation Level:  Did Not Attend  Lynelle Smoke [facility] 11/10/2016, 1:47 PM

## 2016-11-10 NOTE — Consult Note (Signed)
[facility] -  at [facility]   PATIENT NAME: Tony Cunningham    MR#:  [id number]  DATE OF BIRTH:  [birth year]  DATE OF ADMISSION:  11/06/2016  PRIMARY CARE PHYSICIAN: Patient, No Pcp Per   CONSULT REQUESTING/REFERRING PHYSICIAN: Dr. Toni Amend  REASON FOR CONSULT: Uncontrolled diabetes mellitus with hyperglycemia  CHIEF COMPLAINT:  No chief complaint on file. Depression  HISTORY OF PRESENT ILLNESS:  Tony Cunningham  is a 41 y.o. male with a known history of Hypertension, diabetes, depression admitted to the behavioral health unit for depression. Patient has been continued on his home dose of Lantus and pre-meal NovoLog. Here his blood sugars have been ranging between 108 to 300s. His blood sugars are lowest during the evening time. Unable to get any history from the patient as he is withdrawn and does not answer questions. It is unclear how compliant he was with his medications at home. His hemoglobin A1c is elevated at 10.5. Presently he is on Lantus 42 units and 5 units NovoLog 3 times a day with meals.  PAST MEDICAL HISTORY:   Past Medical History:  Diagnosis Date  . Anxiety   . Asthma   . Diabetes mellitus   . High blood pressure   . Sinus complaint     PAST SURGICAL HISTOIRY:  History reviewed. No pertinent surgical history.  SOCIAL HISTORY:   Social History  Substance Use Topics  . Smoking status: Current Every Day Smoker    Packs/day: 0.50    Types: Cigarettes  . Smokeless tobacco: Never Used  . Alcohol use No    FAMILY HISTORY:  History reviewed. No pertinent family history. Cannot hop pain  DRUG ALLERGIES:  No Known Allergies  REVIEW OF SYSTEMS:   ROS  Cannot obtain as patient is withdrawn  MEDICATIONS AT HOME:   Prior to Admission medications   Medication Sig Start Date End Date Taking? Authorizing Provider  cloZAPine (CLOZARIL) 100 MG tablet Take 1 tablet (100 mg total) by mouth daily. Daily at 1700 10/20/16   [name], MD  cyclobenzaprine (FLEXERIL) 10 MG tablet Take 1 tablet (10 mg total) by mouth 3 (three) times daily as needed for muscle spasms. Patient not taking: Reported on 10/21/2016 10/07/16   [name], [name], MD  fluvoxaMINE (LUVOX) 50 MG tablet Take 1 tablet (50 mg total) by mouth daily. Daily at 1700 10/20/16   [name], MD  insulin aspart (NOVOLOG) 100 UNIT/ML injection Inject 5 Units into the skin 3 (three) times daily with meals. 10/20/16   [name], MD  insulin glargine (LANTUS) 100 UNIT/ML injection Inject 0.42 mLs (42 Units total) into the skin at bedtime. 10/20/16   [name], MD  ipratropium (ATROVENT) 0.06 % nasal spray Place 1 spray into both nostrils daily after supper. 10/20/16   [name], MD  lidocaine (LIDODERM) 5 % Place 1 patch onto the skin daily. Remove & Discard patch within 12 hours or as directed by MD Patient not taking: Reported on 10/21/2016 10/21/16   [name], MD  lisinopril (PRINIVIL,ZESTRIL) 20 MG tablet Take 1 tablet (20 mg total) by mouth daily. 10/03/16   [name], MD  meloxicam (MOBIC) 7.5 MG tablet Take 1 tablet (7.5 mg total) by mouth 2 (two) times daily. 10/20/16   [name], MD  metFORMIN (GLUCOPHAGE) 1000 MG tablet Take 1 tablet (1,000 mg total) by mouth 2 (two) times daily with a meal. Patient not taking: Reported on 10/21/2016 07/16/16   [name], MD  montelukast (  SINGULAIR) 10 MG tablet Take 1 tablet (10 mg total) by mouth at bedtime. For Asthma Patient not taking: Reported on 10/21/2016 02/21/16   Armandina Stammer I, NP  polyethylene glycol (MIRALAX / GLYCOLAX) packet Take 17 g by mouth daily. 10/21/16   [name], MD  propranolol (INDERAL) 20 MG tablet Take 1 tablet (20 mg total) by mouth 2 (two) times daily. 10/20/16   [name], MD  simvastatin (ZOCOR) 40 MG tablet Take 1 tablet (40 mg total) by  mouth at bedtime. For high cholesterol Patient not taking: Reported on 10/21/2016 02/21/16   Armandina Stammer I, NP      VITAL SIGNS:  Blood pressure 110/73, pulse 77, temperature 98 F (36.7 C), temperature source Oral, resp. rate 18, height 5\' 8"  (1.727 m), weight 78 kg (172 lb).  PHYSICAL EXAMINATION:  GENERAL:  41 y.o.-year-old patient lying in the bed with no acute distress.  EYES: Pupils equal, round, reactive to light and accommodation. No scleral icterus. Extraocular muscles intact.  HEENT: Head atraumatic, normocephalic. Oropharynx and nasopharynx clear.  NECK:  Supple, no jugular venous distention. No thyroid enlargement, no tenderness.  LUNGS: Normal breath sounds bilaterally, no wheezing, rales,rhonchi or crepitation. No use of accessory muscles of respiration.  CARDIOVASCULAR: S1, S2 normal. No murmurs, rubs, or gallops.  ABDOMEN: Soft, nontender, nondistended. Bowel sounds present. No organomegaly or mass. EXTREMITIES: No pedal edema, cyanosis, or clubbing. NEUROLOGIC: Cranial nerves II through XII are intact. Muscle strength 5/5 in all extremities. Sensation intact. Gait not checked. PSYCHIATRIC: The patient is withdrawn. Does follow instructions  LABORATORY PANEL:   CBC  Recent Labs Lab 11/04/16 1654  WBC 7.2  HGB 14.9  HCT 42.5  PLT 182   ------------------------------------------------------------------------------------------------------------------  Chemistries  No results for input(s): NA, K, CL, CO2, GLUCOSE, BUN, CREATININE, CALCIUM, MG, AST, ALT, ALKPHOS, BILITOT in the last 168 hours.  Invalid input(s): GFRCGP ------------------------------------------------------------------------------------------------------------------  Cardiac Enzymes No results for input(s): TROPONINI in the last 168 hours. ------------------------------------------------------------------------------------------------------------------  RADIOLOGY:  No results found.  EKG:    Orders placed or performed during the hospital encounter of 11/06/16  . EKG 12-Lead  . EKG 12-Lead    IMPRESSION AND PLAN:   * Uncontrolled diabetes with hyperglycemia Hemoglobin A1c elevated at 10.3. Cannot get good history from patient on he has been taking insulin or not. Here he has had elevated blood sugars. He does seem to have lower blood sugars during the evening. At this time we will increase his Lantus to 48 units. Increase pre-meal insulin to 7 units 3 times a day.  * Hypertension. Well-controlled.  * Schizoaffective disorder with depression As per Psychiatry  All the records are reviewed and case discussed with Consulting provider. Management plans discussed with the patient  TOTAL TIME TAKING CARE OF THIS PATIENT: 40 minutes.   11/08/16 R M.D on 11/10/2016 at 5:26 PM  Between 7am to 6pm - Pager - [phone]  After 6pm go to [email or web address] - password [email or web address] [facility]  [facility]  Office  [phone]  CC: Primary care Physician: Patient, No Pcp Per  Note: This dictation was prepared with Dragon dictation along with smaller phrase technology. Any transcriptional errors that result from this process are unintentional.

## 2016-11-10 NOTE — Progress Notes (Signed)
DAR NOTE: Patient presents with flat affect and depressed mood.  Patient remained in his room this evening.  Medication given as prescribed.  Denies SI, auditory and visual hallucinations.  Routine safety checks maintained.  Patient offered support and encouragement as needed.  Denies pain or discomfort.  No hypoglycemia noted.  Patient is safe on the unit.

## 2016-11-10 NOTE — Plan of Care (Signed)
Problem: Education: Goal: Ability to state activities that reduce stress will improve Outcome: Not Progressing Client educated on activities that will reduce stress, was not receptive of the information provided.

## 2016-11-11 ENCOUNTER — Other Ambulatory Visit: Payer: Self-pay | Admitting: Psychiatry

## 2016-11-11 LAB — CBC WITH DIFFERENTIAL/PLATELET
BASOS ABS: 0.1 10*3/uL (ref 0–0.1)
BASOS PCT: 1 %
EOS ABS: 0.4 10*3/uL (ref 0–0.7)
EOS PCT: 5 %
HCT: 40.9 % (ref 40.0–52.0)
Hemoglobin: 14.8 g/dL (ref 13.0–18.0)
Lymphocytes Relative: 41 %
Lymphs Abs: 3.5 10*3/uL (ref 1.0–3.6)
MCH: 31.4 pg (ref 26.0–34.0)
MCHC: 36.2 g/dL — ABNORMAL HIGH (ref 32.0–36.0)
MCV: 86.7 fL (ref 80.0–100.0)
Monocytes Absolute: 0.5 10*3/uL (ref 0.2–1.0)
Monocytes Relative: 6 %
Neutro Abs: 4.2 10*3/uL (ref 1.4–6.5)
Neutrophils Relative %: 49 %
PLATELETS: 169 10*3/uL (ref 150–440)
RBC: 4.72 MIL/uL (ref 4.40–5.90)
RDW: 12.6 % (ref 11.5–14.5)
WBC: 8.6 10*3/uL (ref 3.8–10.6)

## 2016-11-11 LAB — GLUCOSE, CAPILLARY
GLUCOSE-CAPILLARY: 219 mg/dL — AB (ref 65–99)
Glucose-Capillary: 249 mg/dL — ABNORMAL HIGH (ref 65–99)
Glucose-Capillary: 261 mg/dL — ABNORMAL HIGH (ref 65–99)
Glucose-Capillary: 219 mg/dL — ABNORMAL HIGH (ref 65–99)

## 2016-11-11 MED ORDER — GLUCERNA SHAKE PO LIQD
237.0000 mL | Freq: Three times a day (TID) | ORAL | Status: DC
  Administered 2016-11-11 – 2016-11-19 (×22): 237 mL via ORAL
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
## 2016-11-11 NOTE — Progress Notes (Signed)
Patient ID: Tony Cunningham, male   DOB: Dec 31, 1975, 41 y.o.   MRN: [id number] SW spoke with Tami Lin at [facility], [phone] and they have filled Pt's bed as they believed he would be sent to [facility].  They still have his belongings though and will be glad to get them to him whenever he is discharged if staff will let her know how to get in touch with him.  Jake Shark, LCSW

## 2016-11-11 NOTE — Plan of Care (Signed)
Problem: Coping: Goal: Ability to interact with others will improve Outcome: Not Progressing Patient continues to isolate to self and room

## 2016-11-11 NOTE — Progress Notes (Signed)
Clozapine   ANC: 4200. Labs reported to Clozapine [facility] program.   Demetrius Charity, PharmD

## 2016-11-11 NOTE — Progress Notes (Signed)
Pt in dayroom early am. Pt drank coffee. Writer notified MD. Per Dr Toni Amend, will not have ECT today. Pt had bowel incontinent episode. Minimal interaction with staff or peers. Med compliant.  Encouragement and support offered. Safety checks maintained. Pt receptive and remains safe on unit with q 15 min checks.

## 2016-11-11 NOTE — [facility] Group Notes (Signed)
  [facility] LCSW Group Therapy Note  Date/Time:11/11/2016 9:30am  Type of Therapy/Topic:  Group Therapy:  Emotion Regulation  Participation Level:  Did Not Attend    Glennon Mac, LCSW 11/11/2016, 2:44 PM

## 2016-11-11 NOTE — Progress Notes (Signed)
Patient ID: Tony Cunningham, male   DOB: [birth year], 41 y.o.   MRN: [id number] Still disheveled, poor body hygiene, patient claimed he showered, mostly to room except during snacks and medications; poor insight, CBG=98, considered to be very low for this patient based on his records, mood and affect flat, intact LOC, aware of his surroundings, denied pain, NPO past MN for ECT.

## 2016-11-11 NOTE — Progress Notes (Addendum)
Patient ID: Tony Cunningham, male   DOB: 01/29/1976, 41 y.o.   MRN: [id number]  Visible in the day area during snack time, disheveled, encouraged to take a shower "I did.." , denied SI/SIB/AVH, no aggressive behavior, denied pain, CBG=219, Lantus 48U given as ordered.

## 2016-11-11 NOTE — [facility] Group Notes (Signed)
[facility] Group Notes:  (Nursing/MHT/Case Management/Adjunct)  Date:  11/11/2016  Time:  9:32 PM  Type of Therapy:  Group Therapy  Participation Level:  Active  Participation Quality:  Appropriate  Affect:  Appropriate  Cognitive:  Appropriate  Insight:  Appropriate  Engagement in Group:  Engaged  Modes of Intervention:  Discussion  Summary of Progress/Problems:  Burt Ek 11/11/2016, 9:32 PM

## 2016-11-11 NOTE — Plan of Care (Signed)
Problem: Activity: Goal: Sleeping patterns will improve Outcome: Progressing Patient slept for Estimated Hours of 5.15; q15 minutes safety round maintained, no injury or falls during this shift.

## 2016-11-11 NOTE — Progress Notes (Addendum)
[facility] [place] [facility]   PATIENT NAME: Tony Cunningham    MR#:  [id number]  DATE OF BIRTH:  1975-12-15  SUBJECTIVE:  Withdrawn Does not talk much although tells me he is taking his meds as instructed  REVIEW OF SYSTEMS:   Review of Systems  Constitutional: Negative for chills, fever and weight loss.  HENT: Negative for ear discharge, ear pain and nosebleeds.   Eyes: Negative for blurred vision, pain and discharge.  Respiratory: Negative for sputum production, shortness of breath, wheezing and stridor.   Cardiovascular: Negative for chest pain, palpitations, orthopnea and PND.  Gastrointestinal: Negative for abdominal pain, diarrhea, nausea and vomiting.  Genitourinary: Negative for frequency and urgency.  Musculoskeletal: Negative for back pain and joint pain.  Neurological: Positive for weakness. Negative for sensory change, speech change and focal weakness.  Psychiatric/Behavioral: Negative for depression and hallucinations. The patient is not nervous/anxious.    Tolerating Diet:yes Tolerating PT: not needed  DRUG ALLERGIES:  No Known Allergies  VITALS:  Blood pressure 100/72, pulse 84, temperature 98.9 F (37.2 C), temperature source Oral, resp. rate 18, height 5\' 8"  (1.727 m), weight 78 kg (172 lb).  PHYSICAL EXAMINATION:   Physical Exam  GENERAL:  41 y.o.-year-old patient lying in the bed with no acute distress.  EYES: Pupils equal, round, reactive to light and accommodation. No scleral icterus. Extraocular muscles intact.  HEENT: Head atraumatic, normocephalic. Oropharynx and nasopharynx clear.  NECK:  Supple, no jugular venous distention. No thyroid enlargement, no tenderness.  LUNGS: Normal breath sounds bilaterally, no wheezing, rales, rhonchi. No use of accessory muscles of respiration.  CARDIOVASCULAR: S1, S2 normal. No murmurs, rubs, or gallops.  ABDOMEN: Soft, nontender, nondistended. Bowel sounds present. No organomegaly or  mass.  EXTREMITIES: No cyanosis, clubbing or edema b/l.    NEUROLOGIC: Cranial nerves II through XII are intact. No focal Motor or sensory deficits b/l.   PSYCHIATRIC:  patient is alert and oriented x 3.  SKIN: No obvious rash, lesion, or ulcer.   LABORATORY PANEL:  CBC  Recent Labs Lab 11/11/16 0733  WBC 8.6  HGB 14.8  HCT 40.9  PLT 169    Chemistries   Recent Labs Lab 11/10/16 1959  NA 139  K 4.0  CL 103  CO2 29  GLUCOSE 109*  BUN 14  CREATININE 0.54*  CALCIUM 9.1  AST 21  ALT 25  ALKPHOS 55  BILITOT 0.7   Cardiac Enzymes No results for input(s): TROPONINI in the last 168 hours. RADIOLOGY:  Dg Chest 2 View  Result Date: 11/10/2016 CLINICAL DATA:  Pre anesthesia for ECT. Patient reports shortness of breath. EXAM: CHEST  2 VIEW COMPARISON:  None. FINDINGS: The cardiomediastinal contours are normal. Mild central bronchitic change. Pulmonary vasculature is normal. No consolidation, pleural effusion, or pneumothorax. No acute osseous abnormalities are seen. IMPRESSION: Mild central bronchitic change likely secondary to smoking related lung disease. Lungs are otherwise clear. Electronically Signed   By: 11/12/2016 M.D.   On: 11/10/2016 21:56   ASSESSMENT AND PLAN:   Tony Cunningham  is a 41 y.o. male with a known history of Hypertension, diabetes, depression admitted to the behavioral health unit for depression. Patient has been continued on his home dose of Lantus and pre-meal NovoLog. Here his blood sugars have been ranging between 108 to 300s  * Uncontrolled diabetes with hyperglycemia Hemoglobin A1c elevated at 10.3. Cannot get good history from patient on he has been taking insulin or not. Here he  has had elevated blood sugars. He does seem to have lower blood sugars during the evening.  - increase his Lantus to 48 units. Increase pre-meal insulin to 7 units 3 times a day. -cont metformin  * Hypertension. Well-controlled.  * Schizoaffective disorder with  depression As per Psychiatry Plans for ECT noted. Use IV dextrose as needed  Case discussed with Care Management/Social Worker. Management plans discussed with the patient, family and they are in agreement.  CODE STATUS: full  DVT Prophylaxis: ambulatory  TOTAL TIME TAKING CARE OF THIS PATIENT: *25* minutes.  >50% time spent on counselling and coordination of care  D/c plans per psychiatry  Note: This dictation was prepared with Dragon dictation along with smaller phrase technology. Any transcriptional errors that result from this process are unintentional.  Kyrah Schiro M.D on 11/11/2016 at 1:53 PM  Between 7am to 6pm - Pager - [phone]  After 6pm go to [email or web address] - password [email or web address]  [facility]  Office  [phone]  CC: Primary care physician; Patient, No Pcp Per

## 2016-11-12 LAB — GLUCOSE, CAPILLARY
GLUCOSE-CAPILLARY: 339 mg/dL — AB (ref 65–99)
GLUCOSE-CAPILLARY: 298 mg/dL — AB (ref 65–99)
Glucose-Capillary: 155 mg/dL — ABNORMAL HIGH (ref 65–99)
Glucose-Capillary: 331 mg/dL — ABNORMAL HIGH (ref 65–99)

## 2016-11-12 MED ORDER — FLUVOXAMINE MALEATE 50 MG PO TABS: 100.0000 mg | ORAL_TABLET | Freq: Two times a day (BID) | ORAL | Status: DC

## 2016-11-12 MED ORDER — INSULIN GLARGINE 100 UNIT/ML ~~LOC~~ SOLN
55.0000 [IU] | Freq: Every day | SUBCUTANEOUS | Status: DC
  Administered 2016-11-12 – 2016-12-06 (×23): 55 [IU] via SUBCUTANEOUS
  Filled 2016-11-12 (×27): qty 0.55

## 2016-11-12 MED ORDER — FLUVOXAMINE MALEATE 50 MG PO TABS
50.0000 mg | ORAL_TABLET | Freq: Two times a day (BID) | ORAL | Status: DC
  Administered 2016-11-13 – 2016-11-16 (×7): 50 mg via ORAL
  Filled 2016-11-12 (×6): qty 1
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
## 2016-11-12 NOTE — [facility] Group Notes (Signed)
[facility] LCSW Group Therapy  11/12/2016 3:22 PM  Type of Therapy:  Group Therapy  Participation Level:  Patient did not attend group. CSW invited patient to group.   Summary of Progress/Problems: Balance in life: Patients will discuss the concept of balance and how it looks and feels to be unbalanced. Pt will identify areas in their life that is unbalanced and ways to become more balanced. They discussed what aspects in their lives has influenced their self care. Patients also discussed self care in the areas of self regulation/control, hygiene/appearance, sleep/relaxation, healthy leisure, healthy eating habits, exercise, inner peace/spirituality, self improvement, sobriety, and health management. They were challenged to identify changes that are needed in order to improve self care.  Kekoa Fyock G. Garnette Czech MSW, LCSWA 11/12/2016, 3:22 PM

## 2016-11-12 NOTE — Progress Notes (Signed)
Patient up out of bed this morning in dayroom with selective peer interaction noted. Compliant with meals and medications. Speech is clear and gait is steady. Encouragement provided and safety maintained on the unit with q 15 minutes checks. Patient informed that he will have his ECT procedure in the a.m, reminded patient not to eat breakfast in the morning.

## 2016-11-12 NOTE — Progress Notes (Signed)
BS at lunch was 331, info was not transferred via glucometer.

## 2016-11-12 NOTE — Plan of Care (Signed)
Problem: Activity: Goal: Sleeping patterns will improve Outcome: Progressing Patient slept for Estimated Hours of 3.30; q15 minutes safety round maintained, no injury or falls during this shift.    

## 2016-11-12 NOTE — [facility] Group Notes (Signed)
[facility] LCSW Group Therapy Note  Type of Therapy and Topic:  Group Therapy:  Goals Group: SMART Goals  Participation Level:  Patient did not attend group. CSW invited patient to group.   Description of Group:   The purpose of a daily goals group is to assist and guide patients in setting recovery/wellness-related goals.  The objective is to set goals as they relate to the crisis in which they were admitted. Patients will be using SMART goal modalities to set measurable goals.  Characteristics of realistic goals will be discussed and patients will be assisted in setting and processing how one will reach their goal. Facilitator will also assist patients in applying interventions and coping skills learned in psycho-education groups to the SMART goal and process how one will achieve defined goal.  Therapeutic Goals: -Patients will develop and document one goal related to or their crisis in which brought them into treatment. -Patients will be guided by LCSW using SMART goal setting modality in how to set a measurable, attainable, realistic and time sensitive goal.  -Patients will process barriers in reaching goal. -Patients will process interventions in how to overcome and successful in reaching goal.   Summary of Patient Progress:  Patient Goal: None identified at this time.    Therapeutic Modalities:   Motivational Interviewing Engineer, manufacturing systems Therapy Crisis Intervention Model SMART goals setting  Hobert Poplaski G. Garnette Czech MSW, [facility] 11/12/2016 10:55 AM

## 2016-11-12 NOTE — Plan of Care (Signed)
Problem: Health Behavior/Discharge Planning: Goal: Compliance with treatment plan for underlying cause of condition will improve Outcome: Progressing Patient's blood sugar has decreased in comparison to the past readings. Patient verbalized not eating a lot of "sugar".

## 2016-11-12 NOTE — Progress Notes (Signed)
Increased lantus to 55 units at bedtime and cont aspart 7 units tid

## 2016-11-13 ENCOUNTER — Inpatient Hospital Stay: Payer: Medicare Other | Admitting: Anesthesiology

## 2016-11-13 ENCOUNTER — Inpatient Hospital Stay: Payer: Medicare Other

## 2016-11-13 ENCOUNTER — Other Ambulatory Visit: Payer: Self-pay | Admitting: Psychiatry

## 2016-11-13 LAB — GLUCOSE, CAPILLARY
GLUCOSE-CAPILLARY: 68 mg/dL (ref 65–99)
GLUCOSE-CAPILLARY: 203 mg/dL — AB (ref 65–99)
GLUCOSE-CAPILLARY: 224 mg/dL — AB (ref 65–99)
Glucose-Capillary: 258 mg/dL — ABNORMAL HIGH (ref 65–99)
Glucose-Capillary: 221 mg/dL — ABNORMAL HIGH (ref 65–99)

## 2016-11-13 MED ORDER — SUCCINYLCHOLINE CHLORIDE 200 MG/10ML IV SOSY
PREFILLED_SYRINGE | INTRAVENOUS | Status: DC | PRN
  Administered 2016-11-13: 100 mg via INTRAVENOUS

## 2016-11-13 MED ORDER — LABETALOL HCL 5 MG/ML IV SOLN
INTRAVENOUS | Status: AC
  Filled 2016-11-13: qty 4

## 2016-11-13 MED ORDER — INSULIN ASPART 100 UNIT/ML ~~LOC~~ SOLN
10.0000 [IU] | Freq: Three times a day (TID) | SUBCUTANEOUS | Status: DC
  Administered 2016-11-13 – 2016-12-08 (×65): 10 [IU] via SUBCUTANEOUS
  Filled 2016-11-13 (×42): qty 1

## 2016-11-13 MED ORDER — DEXTROSE 5 % IV SOLN: 500.0000 mL | Freq: Once | INTRAVENOUS | Status: DC

## 2016-11-13 MED ORDER — MIDAZOLAM HCL 2 MG/2ML IJ SOLN
INTRAMUSCULAR | Status: AC
  Filled 2016-11-13: qty 2

## 2016-11-13 MED ORDER — METHOHEXITAL SODIUM 100 MG/10ML IV SOSY
PREFILLED_SYRINGE | INTRAVENOUS | Status: DC | PRN
  Administered 2016-11-13: 80 mg via INTRAVENOUS

## 2016-11-13 MED ORDER — MIDAZOLAM HCL 2 MG/2ML IJ SOLN
INTRAMUSCULAR | Status: DC | PRN
  Administered 2016-11-13: 2 mg via INTRAVENOUS

## 2016-11-13 MED ORDER — SODIUM CHLORIDE 0.9 % IV SOLN
INTRAVENOUS | Status: DC | PRN
  Administered 2016-11-13: 09:00:00 via INTRAVENOUS

## 2016-11-13 MED ORDER — LABETALOL HCL 5 MG/ML IV SOLN
INTRAVENOUS | Status: DC | PRN
  Administered 2016-11-13: 10 mg via INTRAVENOUS

## 2016-11-13 MED ORDER — METHOHEXITAL SODIUM 0.5 G IJ SOLR
INTRAMUSCULAR | Status: AC
Start: 2016-11-13 — End: 2016-11-13
  Filled 2016-11-13: qty 500

## 2016-11-13 MED ORDER — GLUCAGON HCL (RDNA) 1 MG IJ SOLR
1.0000 mg | Freq: Once | INTRAMUSCULAR | Status: AC
  Administered 2016-11-13: 1 mg via INTRAMUSCULAR
  Filled 2016-11-13: qty 1

## 2016-11-13 MED ORDER — SUCCINYLCHOLINE CHLORIDE 20 MG/ML IJ SOLN
INTRAMUSCULAR | Status: AC
Start: 2016-11-13 — End: 2016-11-13
  Filled 2016-11-13: qty 1

## 2016-11-13 MED ORDER — DEXTROSE-NACL 5-0.45 % IV SOLN
INTRAVENOUS | Status: DC
  Administered 2016-11-13: 10:00:00 via INTRAVENOUS

## 2016-11-13 MED ORDER — GLUCAGON HCL (RDNA) 1 MG IJ SOLR
1.0000 mg | Freq: Once | INTRAMUSCULAR | Status: DC | PRN
  Filled 2016-11-13: qty 1

## 2016-11-13 NOTE — Anesthesia Procedure Notes (Signed)
Date/Time: 11/13/2016 10:27 AM Performed by: Lily Kocher Pre-anesthesia Checklist: Patient identified, Emergency Drugs available, Suction available and Patient being monitored Patient Re-evaluated:Patient Re-evaluated prior to inductionOxygen Delivery Method: Circle system utilized Preoxygenation: Pre-oxygenation with 100% oxygen Intubation Type: IV induction Ventilation: Mask ventilation without difficulty and Mask ventilation throughout procedure Airway Equipment and Method: Bite block Placement Confirmation: positive ETCO2 Dental Injury: Teeth and Oropharynx as per pre-operative assessment

## 2016-11-13 NOTE — Anesthesia Postprocedure Evaluation (Signed)
Anesthesia Post Note  Patient: Tony Cunningham  Procedure(s) Performed: * No procedures listed *  Patient location during evaluation: PACU Anesthesia Type: General Level of consciousness: awake and alert Pain management: pain level controlled Vital Signs Assessment: post-procedure vital signs reviewed and stable Respiratory status: spontaneous breathing, nonlabored ventilation and respiratory function stable Cardiovascular status: blood pressure returned to baseline and stable Postop Assessment: no signs of nausea or vomiting Anesthetic complications: no     Last Vitals:  Vitals:   11/13/16 1110 11/13/16 1126  BP: 117/72 116/69  Pulse: 71 70  Resp: (!) 24   Temp:      Last Pain:  Vitals:   11/13/16 1126  TempSrc:   PainSc: 0-No pain                 Aaren Krog

## 2016-11-13 NOTE — Progress Notes (Signed)
Inpatient Diabetes Program Recommendations  AACE/ADA: New Consensus Statement on Inpatient Glycemic Control (2015)  Target Ranges:  Prepandial:   less than 140 mg/dL      Peak postprandial:   less than 180 mg/dL (1-2 hours)      Critically ill patients:  140 - 180 mg/dL   Lab Results  Component Value Date   GLUCAP 224 (H) 11/13/2016   HGBA1C 10.3 (H) 11/09/2016    Review of Glycemic Control:  Results for TASMAN, ZAPATA (MRN [id number]) as of 11/13/2016 12:35  Ref. Range 11/12/2016 21:10 11/13/2016 06:45 11/13/2016 08:55 11/13/2016 12:10  Glucose-Capillary Latest Ref Range: 65 - 99 mg/dL 300 (H) 68 923 (H) 300 (H)   Diabetes history: Type 2 diabetes Outpatient Diabetes medications: Lantus 42 units daily, Novolog 5 units tid with meals Current orders for Inpatient glycemic control:  Lantus 55 units q HS, Novolog moderate tid with meals and HS, Metformin 1000 mg bid, Novolog 7 units tid with meals Inpatient Diabetes Program Recommendations:   Please reduce Lantus to 50 units q HS.    Thanks, Beryl Meager, RN, BC-ADM Inpatient Diabetes Coordinator Pager [phone] (8a-5p)

## 2016-11-13 NOTE — Anesthesia Post-op Follow-up Note (Cosign Needed)
Anesthesia QCDR form completed.        

## 2016-11-13 NOTE — Procedures (Signed)
ECT SERVICES Physician's Interval Evaluation & Treatment Note  Patient Identification: Tony Cunningham MRN:  [id number] Date of Evaluation:  11/13/2016 TX #: 1  MADRS:   MMSE:   P.E. Findings:  Heart and lungs normal. Vitals stable. Blood sugar today was low on waking up. The rest of the physical exam is unremarkable.  Psychiatric Interval Note:  Patient with depression and psychosis suicidal ideation  Subjective:  Patient is a 41 y.o. male seen for evaluation for Electroconvulsive Therapy. No specific complaints other than anxiety  Treatment Summary:   []   Right Unilateral             [x]  Bilateral   % Energy : 1.0 ms 30%   Impedance: 850 ohms  Seizure Energy Index: 11,127 V squared  Postictal Suppression Index: No reading obtained  Seizure Concordance Index: 37%  Medications  Pre Shock: Brevital 80 mg succinylcholine 100 mg  Post Shock: Versed 2 mg  Seizure Duration: 15 seconds by EMG 15 seconds by EEG   Comments: Next treatment will be after the following week on July 2. Medicines seem to be good so far. Increase the stimulus to 60%   Lungs:  [x]   Clear to auscultation               []  Other:   Heart:    [x]   Regular rhythm             []  irregular rhythm    [x]   Previous H&P reviewed, patient examined and there are NO CHANGES                 []   Previous H&P reviewed, patient examined and there are changes noted.   , MD 6/22/201810:32 AM

## 2016-11-13 NOTE — Progress Notes (Signed)
[facility] - [place] at [facility]   PATIENT NAME: Tony Cunningham    MR#:  [id number]  DATE OF BIRTH:  [birth year]  SUBJECTIVE:   Does not talk much although tells me he is taking his meds as instructed Per RN tends to skip bedtime snack REVIEW OF SYSTEMS:   Review of Systems  Constitutional: Negative for chills, fever and weight loss.  HENT: Negative for ear discharge, ear pain and nosebleeds.   Eyes: Negative for blurred vision, pain and discharge.  Respiratory: Negative for sputum production, shortness of breath, wheezing and stridor.   Cardiovascular: Negative for chest pain, palpitations, orthopnea and PND.  Gastrointestinal: Negative for abdominal pain, diarrhea, nausea and vomiting.  Genitourinary: Negative for frequency and urgency.  Musculoskeletal: Negative for back pain and joint pain.  Neurological: Positive for weakness. Negative for sensory change, speech change and focal weakness.  Psychiatric/Behavioral: Negative for depression and hallucinations. The patient is not nervous/anxious.    Tolerating Diet:yes Tolerating PT: not needed  DRUG ALLERGIES:  No Known Allergies  VITALS:  Blood pressure 117/77, pulse 92, temperature 98 F (36.7 C), temperature source Oral, resp. rate 18, height 5\' 8"  (1.727 m), weight 77.1 kg (170 lb), SpO2 96 %.  PHYSICAL EXAMINATION:   Physical Exam  GENERAL:  41 y.o.-year-old patient lying in the bed with no acute distress.  EYES: Pupils equal, round, reactive to light and accommodation. No scleral icterus. Extraocular muscles intact.  HEENT: Head atraumatic, normocephalic. Oropharynx and nasopharynx clear.  NECK:  Supple, no jugular venous distention. No thyroid enlargement, no tenderness.  LUNGS: Normal breath sounds bilaterally, no wheezing, rales, rhonchi. No use of accessory muscles of respiration.  CARDIOVASCULAR: S1, S2 normal. No murmurs, rubs, or gallops.  ABDOMEN: Soft, nontender, nondistended. Bowel  sounds present. No organomegaly or mass.  EXTREMITIES: No cyanosis, clubbing or edema b/l.    NEUROLOGIC: Cranial nerves II through XII are intact. No focal Motor or sensory deficits b/l.   PSYCHIATRIC:  patient is alert and depressed.  SKIN: No obvious rash, lesion, or ulcer.   LABORATORY PANEL:  CBC  Recent Labs Lab 11/11/16 0733  WBC 8.6  HGB 14.8  HCT 40.9  PLT 169    Chemistries   Recent Labs Lab 11/10/16 1959  NA 139  K 4.0  CL 103  CO2 29  GLUCOSE 109*  BUN 14  CREATININE 0.54*  CALCIUM 9.1  AST 21  ALT 25  ALKPHOS 55  BILITOT 0.7   Cardiac Enzymes No results for input(s): TROPONINI in the last 168 hours. RADIOLOGY:  No results found. ASSESSMENT AND PLAN:   Tony Cunningham  is a 41 y.o. male with a known history of Hypertension, diabetes, depression admitted to the behavioral health unit for depression. Patient has been continued on his home dose of Lantus and pre-meal NovoLog. Here his blood sugars have been ranging between 108 to 300s  * Uncontrolled diabetes with hyperglycemia Hemoglobin A1c elevated at 10.3. Cannot get good history from patient on he has been taking insulin or not. Here he has had elevated blood sugars. He does seem to have lower blood sugars during the evening.  - increase his Lantus to 48 units. Increase pre-meal insulin to 10 units 3 times a day. -cont metformin  * Hypertension. Well-controlled.  * Schizoaffective disorder with depression As per Psychiatry Plans for ECT noted. Use IV dextrose as needed  Will cont to follow as needed. Call if needed. Pt to take bedtime snack  Case discussed  with Care Management/Social Worker. Management plans discussed with the patient, family and they are in agreement.  CODE STATUS: full  DVT Prophylaxis: ambulatory  TOTAL TIME TAKING CARE OF THIS PATIENT: *25* minutes.  >50% time spent on counselling and coordination of care  D/c plans per psychiatry  Note: This dictation was  prepared with Dragon dictation along with smaller phrase technology. Any transcriptional errors that result from this process are unintentional.  Irving Bloor M.D on 11/13/2016 at 3:53 PM  Between 7am to 6pm - Pager - [phone]  After 6pm go to [email or web address] - password [email or web address]  [facility]  Office  [phone]  CC: Primary care physician; Patient, No Pcp Per

## 2016-11-13 NOTE — Progress Notes (Signed)
Hypoglycemic Event  CBG: 68  Treatment: Glucagon IM 1 mg  Symptoms: None  Follow-up CBG: Time:AM RN will administer Glucagon and follow up with CBG.   Possible Reasons for Event: Medication regimen: 55 units of Lantus at HS  Comments/MD notified:Dr [name] notified.  Glucagon ordered.     Kaytlyn Din, [employer]

## 2016-11-13 NOTE — [facility] Group Notes (Signed)
[facility] LCSW Group Therapy  11/13/2016 1:30 PM  Type of Therapy:  Group Therapy  Participation Level:  Patient did not attend group. CSW invited patient to group.   Summary of Progress/Problems: Feelings around Relapse. Group members discussed the meaning of relapse and shared personal stories of relapse, how it affected them and others, and how they perceived themselves during this time. Group members were encouraged to identify triggers, warning signs and coping skills used when facing the possibility of relapse. Social supports were discussed and explored in detail. Patients also discussed facing disappointment and how that can trigger someone to relapse.  Alaa Eyerman G. Garnette Czech MSW, LCSWA 11/13/2016, 1:31 PM
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
## 2016-11-13 NOTE — Plan of Care (Signed)
Problem: Safety: Goal: Periods of time without injury will increase Outcome: Progressing Patient remains safe and without injury and on continous Q15 minute checks.

## 2016-11-13 NOTE — Anesthesia Preprocedure Evaluation (Signed)
Anesthesia Evaluation  Patient identified by MRN, date of birth, ID band Patient awake    Reviewed: Allergy & Precautions, NPO status , Patient's Chart, lab work & pertinent test results  History of Anesthesia Complications Negative for: history of anesthetic complications  Airway Mallampati: II  TM Distance: >3 FB Neck ROM: Full    Dental no notable dental hx.    Pulmonary asthma , Current Smoker,    breath sounds clear to auscultation- rhonchi (-) wheezing      Cardiovascular hypertension, Pt. on medications (-) CAD, (-) Past MI and (-) Cardiac Stents  Rhythm:Regular Rate:Normal - Systolic murmurs and - Diastolic murmurs    Neuro/Psych PSYCHIATRIC DISORDERS Anxiety Depression Schizophrenia negative neurological ROS     GI/Hepatic negative GI ROS, Neg liver ROS,   Endo/Other  diabetes, Insulin Dependent  Renal/GU negative Renal ROS     Musculoskeletal negative musculoskeletal ROS (+)   Abdominal (+) - obese,   Peds  Hematology negative hematology ROS (+)   Anesthesia Other Findings Past Medical History: No date: Anxiety No date: Asthma No date: Diabetes mellitus No date: High blood pressure No date: Sinus complaint   Reproductive/Obstetrics                             Anesthesia Physical Anesthesia Plan  ASA: II  Anesthesia Plan: General   Post-op Pain Management:    Induction: Intravenous  PONV Risk Score and Plan: 0  Airway Management Planned: Mask  Additional Equipment:   Intra-op Plan:   Post-operative Plan:   Informed Consent: I have reviewed the patients History and Physical, chart, labs and discussed the procedure including the risks, benefits and alternatives for the proposed anesthesia with the patient or authorized representative who has indicated his/her understanding and acceptance.   Dental advisory given  Plan Discussed with: CRNA and  Anesthesiologist  Anesthesia Plan Comments:         Anesthesia Quick Evaluation

## 2016-11-13 NOTE — Progress Notes (Signed)
Called Dr Toni Amend and left voice mail to discuss patient's blood sugar.  Patient CBG is 68 this AM.  Patient is NPO.  Called MD to see if he wants me to give a small amount of orange juice.

## 2016-11-13 NOTE — Progress Notes (Addendum)
Patient ID: Tony Cunningham, male   DOB: 1975-06-29, 41 y.o.   MRN: [id number]  Case Management Update 11/13/2016  CSW contacted the following [facility]) to find placement for patient. Findings are as follows:  [facility]- No Answer; mailbox full unable to leave voicemail. [facility]- No beds [facility]- No beds  [facility]- CSW spoke with group home staff. Informed CSW that they do have a bed available for the male. CSW faxed FL2 throught [employer]. Decision pending review from Group home Production designer, theatre/television/film.   [facility]- beds available. Home manager- Janace Aris [phone]). Group home staff informed CSW that  Miss Metta Clines was not there and to contact her Monday morning about referring patient to one of her [facility].    [facility]- Group home manager Lupita Leash [phone]. Will be coming to see patient on 11/14/2016 for group home interview. Decision pending on group home interview.   [facility]- group home manager Quillian Quince [phone]. CSW spoke with Miss Quillian Quince on today. Informed CSW that she will have a bed coming available and wants to schedule interview to meet with patient next week. Wants assigned CSW to contact her on Monday 11/16/2016 to schedule a day and time to meet patient.   Case Management Update 11/14/2016  [facility]- CSW spoke with [name] (Group Home manager [phone]). Stated that she does have a opening for a male. CSW faxed over FL2 in [place], decision pending review from group home manager.   [facility]- Group home manager Lupita Leash and Brett Canales, came to see patient for group home interview. They are looking to place patient in there new [facility] opening July 1st. Group home manager would like to be contacted Monday for their decision. No further questions for CSW at this time.   Dominic Mahaney G. Garnette Czech MSW, LCSWA 11/14/2016 2:27 PM

## 2016-11-13 NOTE — Progress Notes (Signed)
Patient went to ECT this morning. Patient returned and is alert and oriented x 4, breathing unlabored, and extremities x 4 within normal limits. Patient is calm and cooperative. Patient did not display any disruptive behavior. Patient denies any SI/HI/VH/AH. Patient appear to be more alert and assertive with staff during conversation. Patient still likes to remain withdrawn to room, but comes out for meals and snack. Will continue to monitor patient and notify MD of any changes.

## 2016-11-13 NOTE — H&P (Signed)
Tony Cunningham is an 41 y.o. male.   Chief Complaint: Patient with schizoaffective disorder. Flat depressed irrational fear HPI: Patient with schizoaffective disorder who has been in what appears to be a severe depression with psychotic features for probably months  Past Medical History:  Diagnosis Date  . Anxiety   . Asthma   . Diabetes mellitus   . High blood pressure   . Sinus complaint     History reviewed. No pertinent surgical history.  History reviewed. No pertinent family history. Social History:  reports that he has been smoking Cigarettes.  He has been smoking about 0.50 packs per day. He has never used smokeless tobacco. He reports that he does not drink alcohol or use drugs.  Allergies: No Known Allergies  Medications Prior to Admission  Medication Sig Dispense Refill  . cloZAPine (CLOZARIL) 100 MG tablet Take 1 tablet (100 mg total) by mouth daily. Daily at 1700 30 tablet 0  . cyclobenzaprine (FLEXERIL) 10 MG tablet Take 1 tablet (10 mg total) by mouth 3 (three) times daily as needed for muscle spasms. 60 tablet 0  . fluvoxaMINE (LUVOX) 50 MG tablet Take 1 tablet (50 mg total) by mouth daily. Daily at 1700 30 tablet 0  . insulin aspart (NOVOLOG) 100 UNIT/ML injection Inject 5 Units into the skin 3 (three) times daily with meals. 5 mL 0  . insulin glargine (LANTUS) 100 UNIT/ML injection Inject 0.42 mLs (42 Units total) into the skin at bedtime. 13 mL 0  . ipratropium (ATROVENT) 0.06 % nasal spray Place 1 spray into both nostrils daily after supper. 3 mL 0  . lidocaine (LIDODERM) 5 % Place 1 patch onto the skin daily. Remove & Discard patch within 12 hours or as directed by MD 30 patch 0  . lisinopril (PRINIVIL,ZESTRIL) 20 MG tablet Take 1 tablet (20 mg total) by mouth daily. 30 tablet 0  . meloxicam (MOBIC) 7.5 MG tablet Take 1 tablet (7.5 mg total) by mouth 2 (two) times daily. 60 tablet 0  . metFORMIN (GLUCOPHAGE) 1000 MG tablet Take 1 tablet (1,000 mg total) by mouth 2  (two) times daily with a meal. 60 tablet 0  . montelukast (SINGULAIR) 10 MG tablet Take 1 tablet (10 mg total) by mouth at bedtime. For Asthma 30 tablet 0  . polyethylene glycol (MIRALAX / GLYCOLAX) packet Take 17 g by mouth daily. 30 each 0  . propranolol (INDERAL) 20 MG tablet Take 1 tablet (20 mg total) by mouth 2 (two) times daily. 60 tablet 0  . simvastatin (ZOCOR) 40 MG tablet Take 1 tablet (40 mg total) by mouth at bedtime. For high cholesterol 15 tablet 0    Results for orders placed or performed during the hospital encounter of 11/06/16 (from the past 48 hour(s))  Glucose, capillary     Status: Abnormal   Collection Time: 11/11/16 11:35 AM  Result Value Ref Range   Glucose-Capillary 249 (H) 65 - 99 mg/dL  Glucose, capillary     Status: Abnormal   Collection Time: 11/11/16  4:25 PM  Result Value Ref Range   Glucose-Capillary 261 (H) 65 - 99 mg/dL  Glucose, capillary     Status: Abnormal   Collection Time: 11/11/16  8:28 PM  Result Value Ref Range   Glucose-Capillary 219 (H) 65 - 99 mg/dL  Glucose, capillary     Status: Abnormal   Collection Time: 11/12/16  6:28 AM  Result Value Ref Range   Glucose-Capillary 155 (H) 65 - 99 mg/dL  Comment 1 Notify RN   Glucose, capillary     Status: Abnormal   Collection Time: 11/12/16 11:44 AM  Result Value Ref Range   Glucose-Capillary 331 (H) 65 - 99 mg/dL  Glucose, capillary     Status: Abnormal   Collection Time: 11/12/16  4:39 PM  Result Value Ref Range   Glucose-Capillary 339 (H) 65 - 99 mg/dL  Glucose, capillary     Status: Abnormal   Collection Time: 11/12/16  9:10 PM  Result Value Ref Range   Glucose-Capillary 298 (H) 65 - 99 mg/dL  Glucose, capillary     Status: None   Collection Time: 11/13/16  6:45 AM  Result Value Ref Range   Glucose-Capillary 68 65 - 99 mg/dL  Glucose, capillary     Status: Abnormal   Collection Time: 11/13/16  8:55 AM  Result Value Ref Range   Glucose-Capillary 203 (H) 65 - 99 mg/dL   No results  found.  Review of Systems  Constitutional: Negative.   HENT: Negative.   Eyes: Negative.   Respiratory: Negative.   Cardiovascular: Negative.   Gastrointestinal: Negative.   Musculoskeletal: Negative.   Skin: Negative.   Neurological: Negative.   Psychiatric/Behavioral: Positive for depression, memory loss and suicidal ideas. Negative for hallucinations and substance abuse. The patient is nervous/anxious and has insomnia.     Blood pressure 122/82, pulse 75, temperature 98.4 F (36.9 C), temperature source Oral, resp. rate 16, height 5\' 8"  (1.727 m), weight 170 lb (77.1 kg), SpO2 95 %. Physical Exam  Nursing note and vitals reviewed. Constitutional: He appears well-developed and well-nourished.  HENT:  Head: Normocephalic and atraumatic.  Eyes: Conjunctivae are normal. Pupils are equal, round, and reactive to light.  Neck: Normal range of motion.  Cardiovascular: Regular rhythm and normal heart sounds.   Respiratory: Effort normal. No respiratory distress.  GI: Soft.  Musculoskeletal: Normal range of motion.  Neurological: He is alert.  Skin: Skin is warm and dry.  Psychiatric: Judgment normal. His affect is blunt. His speech is delayed. He is slowed and withdrawn. He exhibits a depressed mood. He expresses suicidal ideation. He exhibits abnormal recent memory.     Assessment/Plan We are attempting ECT to try and treat his psychotic depression. I will be away next week but we will resume treatments on July 2  12-04-1984, MD 11/13/2016, 10:19 AM

## 2016-11-13 NOTE — [facility] Suicide Risk Assessment (Signed)
[facility] INPATIENT:  Family/Significant Other Suicide Prevention Education  Suicide Prevention Education:  Contact Attempts:Tony Cunningham(mother [phone]), (name of family member/significant other) has been identified by the patient as the family member/significant other with whom the patient will be residing, and identified as the person(s) who will aid the patient in the event of a mental health crisis.  With written consent from the patient, two attempts were made to provide suicide prevention education, prior to and/or following the patient's discharge.  We were unsuccessful in providing suicide prevention education.  A suicide education pamphlet was given to the patient to share with family/significant other.  Date and time of first attempt: 11/13/2016 / 11:01am; No answer, CSW  left HIPAA compliant voicemail requesting returned call.  Antoni Stefan G. Garnette Czech MSW, LCSWA 11/13/2016, 11:01 AM

## 2016-11-13 NOTE — Transfer of Care (Addendum)
Immediate Anesthesia Transfer of Care Note  Patient: Tony Cunningham  Procedure(s) Performed: ECT  Patient Location: PACU  Anesthesia Type:General  Level of Consciousness: sedated  Airway & Oxygen Therapy: Patient Spontanous Breathing  Post-op Assessment: Report given to RN and Post -op Vital signs reviewed and stable  Post vital signs: Reviewed and stable  Last Vitals:  Vitals:   11/13/16 0930 11/13/16 1041  BP: 122/82 114/75  Pulse: 75 76  Resp: 16 (!) 24  Temp: 36.9 C 36.2 C    Last Pain:  Vitals:   11/13/16 0930  TempSrc: Oral  PainSc:          Complications: No apparent anesthesia complications

## 2016-11-13 NOTE — [place] FL2 (Signed)
[place] MEDICAID FL2 LEVEL OF CARE SCREENING TOOL     IDENTIFICATION  Patient Name: Tony Cunningham Birthdate: 13-Nov-1975 Sex: male Admission Date (Current Location): 11/06/2016  [place] and [place] Number:  [address] [id number] T Facility and Address:  [facility], [address] [place], [place] [address]      Provider Number: [id number]  Attending Physician Name and Address:  [name], MD  Relative Name and Phone Number:  [name], mother.  [phone]    Current Level of Care: Hospital Recommended Level of Care: [facility] Prior Approval Number:    Date Approved/Denied:   PASRR Number: [id number]  Discharge Plan: Other (Comment) [facility])    Current Diagnoses: Patient Active Problem List   Diagnosis Date Noted  . Schizoaffective disorder, depressive type (HCC) 09/23/2016  . Polysubstance abuse 07/17/2016  . Tardive dyskinesia 07/16/2016  . Tobacco use disorder 07/07/2016  . Dyslipidemia 07/07/2016  . Asthma 07/07/2016  . HTN (hypertension) 07/06/2016  . Diabetes (HCC) 12/25/2010    Orientation RESPIRATION BLADDER Height & Weight     Self, Time, Situation, Place  Normal Continent Weight: 170 lb (77.1 kg) Height:  5\' 8"  (172.7 cm)  BEHAVIORAL SYMPTOMS/MOOD NEUROLOGICAL BOWEL NUTRITION STATUS     (n/a) Continent  (Diabetic)  AMBULATORY STATUS COMMUNICATION OF NEEDS Skin   Independent Verbally Normal                       Personal Care Assistance Level of Assistance  [name] Patient is independent with ADL's; No physical assistance is needed.        Functional Limitations Info   (No funtional limitations identified at this time. )          SPECIAL CARE FACTORS FREQUENCY  Blood pressure Blood Pressure Frequency: Patient has hypertension.                  Contractures Contractures Info: Not present    Additional Factors Info   (Patient has no known allergies. )                Current Medications (11/13/2016):  This is the current hospital active medication list Current Facility-Administered Medications  Medication Dose Route Frequency Provider Last Rate Last Dose  . acetaminophen (TYLENOL) tablet 650 mg  650 mg Oral Q6H PRN [name], [name], MD      . alum & mag hydroxide-simeth (MAALOX/MYLANTA) 200-200-20 MG/5ML suspension 30 mL  30 mL Oral Q4H PRN [name], [name], MD      . cloZAPine (CLOZARIL) tablet 225 mg  225 mg Oral QPC supper [name], 12-01-2000, MD   225 mg at 11/12/16 1710  . dextrose 5 %-0.45 % sodium chloride infusion   Intravenous Continuous [name], 11/14/16, MD 10 mL/hr at 11/13/16 1009    . feeding supplement (GLUCERNA SHAKE) (GLUCERNA SHAKE) liquid 237 mL  237 mL Oral TID BM 11/15/16, MD   Stopped at 11/13/16 1000  . fluvoxaMINE (LUVOX) tablet 50 mg  50 mg Oral BID [name], 11/15/16, MD   Stopped at 11/13/16 [phone]  . insulin aspart (novoLOG) injection 0-15 Units  0-15 Units Subcutaneous TID WC [name], 07-24-1984, MD   11 Units at 11/12/16 1647  . insulin aspart (novoLOG) injection 7 Units  7 Units Subcutaneous TID WC 11/14/16, MD   Stopped at 11/13/16 [phone]  . insulin glargine (LANTUS) injection 55 Units  55 Units Subcutaneous QHS 2094, MD   55  Units at 11/12/16 2111  . lisinopril (PRINIVIL,ZESTRIL) tablet 20 mg  20 mg Oral Daily [name], [name], MD   20 mg at 11/13/16 9563  . magnesium hydroxide (MILK OF MAGNESIA) suspension 30 mL  30 mL Oral Daily PRN [name], [name], MD      . meloxicam (MOBIC) tablet 7.5 mg  7.5 mg Oral BID PC [name], [name], MD   Stopped at 11/13/16 0820  . metFORMIN (GLUCOPHAGE) tablet 1,000 mg  1,000 mg Oral BID WC [name], [name], MD   Stopped at 11/13/16 0820  . montelukast (SINGULAIR) tablet 10 mg  10 mg Oral QHS [name], [name], MD   10 mg at 11/12/16 2111  . nicotine (NICODERM CQ - dosed in mg/24 hr) patch 7 mg  7 mg Transdermal Daily Darliss Ridgel, MD   Stopped at 11/13/16 [phone]  . polyethylene glycol (MIRALAX /  GLYCOLAX) packet 17 g  17 g Oral Daily [name], [name], MD   Stopped at 11/13/16 [phone]  . promethazine (PHENERGAN) tablet 12.5 mg  12.5 mg Oral Q6H PRN [name], [name], MD      . propranolol (INDERAL) tablet 20 mg  20 mg Oral BID [name], [name], MD   20 mg at 11/13/16 0659  . simvastatin (ZOCOR) tablet 40 mg  40 mg Oral q1800 [name], [name], MD   40 mg at 11/12/16 1710     Discharge Medications: Please see discharge summary for a list of discharge medications.  Relevant Imaging Results:  Relevant Lab Results:   Additional Information Patient also has history of asthma.   Arelia Longest, LCSWA

## 2016-11-13 NOTE — Tx Team (Signed)
Interdisciplinary Treatment and Diagnostic Plan Update  11/13/2016 Time of Session: 2:11pm Tony Cunningham MRN: [id number]  Principal Diagnosis: <principal problem not specified>  Secondary Diagnoses: Active Problems:   Schizoaffective disorder, depressive type (HCC)   Current Medications:  Current Facility-Administered Medications  Medication Dose Route Frequency Provider Last Rate Last Dose  . acetaminophen (TYLENOL) tablet 650 mg  650 mg Oral Q6H PRN [name], [name], MD      . alum & mag hydroxide-simeth (MAALOX/MYLANTA) 200-200-20 MG/5ML suspension 30 mL  30 mL Oral Q4H PRN [name], [name], MD      . cloZAPine (CLOZARIL) tablet 225 mg  225 mg Oral QPC supper [name], Madie Reno, MD   225 mg at 11/12/16 1710  . dextrose 5 %-0.45 % sodium chloride infusion   Intravenous Continuous [name], Madie Reno, MD 10 mL/hr at 11/13/16 1009    . feeding supplement (GLUCERNA SHAKE) (GLUCERNA SHAKE) liquid 237 mL  237 mL Oral TID BM Fritzi Mandes, MD   Stopped at 11/13/16 1000  . fluvoxaMINE (LUVOX) tablet 50 mg  50 mg Oral BID [name], Madie Reno, MD   50 mg at 11/13/16 3903  . insulin aspart (novoLOG) injection 0-15 Units  0-15 Units Subcutaneous TID WC [name], Madie Reno, MD   5 Units at 11/13/16 1215  . insulin aspart (novoLOG) injection 7 Units  7 Units Subcutaneous TID WC Hillary Bow, MD   7 Units at 11/13/16 1216  . insulin glargine (LANTUS) injection 55 Units  55 Units Subcutaneous QHS Fritzi Mandes, MD   55 Units at 11/12/16 2111  . lisinopril (PRINIVIL,ZESTRIL) tablet 20 mg  20 mg Oral Daily [name], Madie Reno, MD   20 mg at 11/13/16 0092  . magnesium hydroxide (MILK OF MAGNESIA) suspension 30 mL  30 mL Oral Daily PRN [name], [name], MD      . meloxicam (MOBIC) tablet 7.5 mg  7.5 mg Oral BID PC [name], Madie Reno, MD   Stopped at 11/13/16 0820  . metFORMIN (GLUCOPHAGE) tablet 1,000 mg  1,000 mg Oral BID WC [name], Madie Reno, MD   1,000 mg at 11/13/16 0820  . montelukast (SINGULAIR) tablet 10 mg  10 mg Oral QHS  [name], Madie Reno, MD   10 mg at 11/12/16 2111  . nicotine (NICODERM CQ - dosed in mg/24 hr) patch 7 mg  7 mg Transdermal Daily Chauncey Mann, MD   Stopped at 11/13/16 [phone]  . polyethylene glycol (MIRALAX / GLYCOLAX) packet 17 g  17 g Oral Daily [name], Madie Reno, MD   Stopped at 11/13/16 [phone]  . promethazine (PHENERGAN) tablet 12.5 mg  12.5 mg Oral Q6H PRN [name], [name], MD      . propranolol (INDERAL) tablet 20 mg  20 mg Oral BID [name], Madie Reno, MD   20 mg at 11/13/16 0659  . simvastatin (ZOCOR) tablet 40 mg  40 mg Oral q1800 [name], Madie Reno, MD   40 mg at 11/12/16 1710   PTA Medications: Prescriptions Prior to Admission  Medication Sig Dispense Refill Last Dose  . cloZAPine (CLOZARIL) 100 MG tablet Take 1 tablet (100 mg total) by mouth daily. Daily at 1700 30 tablet 0 11/12/2016  . cyclobenzaprine (FLEXERIL) 10 MG tablet Take 1 tablet (10 mg total) by mouth 3 (three) times daily as needed for muscle spasms. 60 tablet 0 11/12/2016  . fluvoxaMINE (LUVOX) 50 MG tablet Take 1 tablet (50 mg total) by mouth daily. Daily at 1700 30 tablet 0 11/12/2016  . insulin aspart (  NOVOLOG) 100 UNIT/ML injection Inject 5 Units into the skin 3 (three) times daily with meals. 5 mL 0 11/12/2016  . insulin glargine (LANTUS) 100 UNIT/ML injection Inject 0.42 mLs (42 Units total) into the skin at bedtime. 13 mL 0 11/12/2016  . ipratropium (ATROVENT) 0.06 % nasal spray Place 1 spray into both nostrils daily after supper. 3 mL 0 11/12/2016  . lidocaine (LIDODERM) 5 % Place 1 patch onto the skin daily. Remove & Discard patch within 12 hours or as directed by MD 30 patch 0 11/12/2016  . lisinopril (PRINIVIL,ZESTRIL) 20 MG tablet Take 1 tablet (20 mg total) by mouth daily. 30 tablet 0 11/12/2016  . meloxicam (MOBIC) 7.5 MG tablet Take 1 tablet (7.5 mg total) by mouth 2 (two) times daily. 60 tablet 0 11/12/2016  . metFORMIN (GLUCOPHAGE) 1000 MG tablet Take 1 tablet (1,000 mg total) by mouth 2 (two) times daily with a meal. 60 tablet  0 11/12/2016  . montelukast (SINGULAIR) 10 MG tablet Take 1 tablet (10 mg total) by mouth at bedtime. For Asthma 30 tablet 0 11/12/2016  . polyethylene glycol (MIRALAX / GLYCOLAX) packet Take 17 g by mouth daily. 30 each 0 11/12/2016  . propranolol (INDERAL) 20 MG tablet Take 1 tablet (20 mg total) by mouth 2 (two) times daily. 60 tablet 0 11/13/2016 at Unknown time  . simvastatin (ZOCOR) 40 MG tablet Take 1 tablet (40 mg total) by mouth at bedtime. For high cholesterol 15 tablet 0 11/12/2016    Patient Stressors:    Patient Strengths:    Treatment Modalities: Medication Management, Group therapy, Case management,  1 to 1 session with clinician, Psychoeducation, Recreational therapy.   Physician Treatment Plan for Primary Diagnosis: <principal problem not specified> Long Term Goal(s): Improvement in symptoms so as ready for discharge Improvement in symptoms so as ready for discharge   Short Term Goals: Ability to verbalize feelings will improve Ability to demonstrate self-control will improve Ability to identify and develop effective coping behaviors will improve Ability to identify and develop effective coping behaviors will improve Ability to identify triggers associated with substance abuse/mental health issues will improve  Medication Management: Evaluate patient's response, side effects, and tolerance of medication regimen.  Therapeutic Interventions: 1 to 1 sessions, Unit Group sessions and Medication administration.  Evaluation of Outcomes: Progressing  Physician Treatment Plan for Secondary Diagnosis: Active Problems:   Schizoaffective disorder, depressive type ([place])  Long Term Goal(s): Improvement in symptoms so as ready for discharge Improvement in symptoms so as ready for discharge   Short Term Goals: Ability to verbalize feelings will improve Ability to demonstrate self-control will improve Ability to identify and develop effective coping behaviors will improve Ability  to identify and develop effective coping behaviors will improve Ability to identify triggers associated with substance abuse/mental health issues will improve     Medication Management: Evaluate patient's response, side effects, and tolerance of medication regimen.  Therapeutic Interventions: 1 to 1 sessions, Unit Group sessions and Medication administration.  Evaluation of Outcomes: Not Met   RN Treatment Plan for Primary Diagnosis: <principal problem not specified> Long Term Goal(s): Knowledge of disease and therapeutic regimen to maintain health will improve  Short Term Goals: Ability to identify and develop effective coping behaviors will improve and Compliance with prescribed medications will improve  Medication Management: RN will administer medications as ordered by provider, will assess and evaluate patient's response and provide education to patient for prescribed medication. RN will report any adverse and/or side effects to prescribing provider.  Therapeutic Interventions: 1 on 1 counseling sessions, Psychoeducation, Medication administration, Evaluate responses to treatment, Monitor vital signs and CBGs as ordered, Perform/monitor CIWA, COWS, AIMS and Fall Risk screenings as ordered, Perform wound care treatments as ordered.  Evaluation of Outcomes: Not Met   LCSW Treatment Plan for Primary Diagnosis: <principal problem not specified> Long Term Goal(s): Safe transition to appropriate next level of care at discharge, Engage patient in therapeutic group addressing interpersonal concerns.  Short Term Goals: Engage patient in aftercare planning with referrals and resources and Increase skills for wellness and recovery  Therapeutic Interventions: Assess for all discharge needs, 1 to 1 time with Social worker, Explore available resources and support systems, Assess for adequacy in community support network, Educate family and significant other(s) on suicide prevention, Complete  Psychosocial Assessment, Interpersonal group therapy.  Evaluation of Outcomes: Not Met   Progress in Treatment: Attending groups: No. Participating in groups: No. Taking medication as prescribed: Yes. Toleration medication: Yes. Family/Significant other contact made: No, will contact:  CSW making contact attempts to patient's mother.  Patient understands diagnosis: Yes. Discussing patient identified problems/goals with staff: Yes. Medical problems stabilized or resolved: Yes. Denies suicidal/homicidal ideation: Yes. Issues/concerns per patient self-inventory: No. Other: n/a  New problem(s) identified: None identified at this time.   New Short Term/Long Term Goal(s): None identified at this time.   Discharge Plan or Barriers: CSW still assessing appropriate discharge plan. MD feels patient does not need referral to [facility] at this time. Pt is still isolating/withdrawing to his room and neglecting personal hygiene. Minimal interaction with milieu staff.   Reason for Continuation of Hospitalization: Depression Medication stabilization Other; describe isolating/withdrawing to room.   Estimated Length of Stay: 7 days.   Attendees: Patient: 11/13/2016 2:11 PM  Physician: Gonzella Lex 11/13/2016 2:11 PM  Nursing: Elige Radon, RN 11/13/2016 2:11 PM  RN Care Manager: 11/13/2016 2:11 PM  Social Worker: Lear Ng. Claybon Jabs MSW, [place] 11/13/2016 2:11 PM  Recreational Therapist:  11/13/2016 2:11 PM  Other:  11/13/2016 2:11 PM  Other:  11/13/2016 2:11 PM  Other: 11/13/2016 2:11 PM    Scribe for Treatment Team: Jolaine Click, [place] 11/13/2016 2:19 PM

## 2016-11-14 LAB — GLUCOSE, CAPILLARY
Glucose-Capillary: 92 mg/dL (ref 65–99)
Glucose-Capillary: 242 mg/dL — ABNORMAL HIGH (ref 65–99)
Glucose-Capillary: 139 mg/dL — ABNORMAL HIGH (ref 65–99)
Glucose-Capillary: 208 mg/dL — ABNORMAL HIGH (ref 65–99)

## 2016-11-14 NOTE — Progress Notes (Signed)
Pt appeared to sleep for 5.5 hours while monitored on 15 minute safety checks

## 2016-11-14 NOTE — Progress Notes (Signed)
Patient was visible on the unit earlier in the morning after CBG checks and stayed up in dayroom unitl breakfast was served. Patient reported he feelings a little better and denies any SI/HI/AH/VH. Patient did take a shower this morning before breakfast. Patient still was withdrawn to room, but more responsive to staff when approached and asked questions. Patient is alert and oriented x 4, breathing unlabored, and extremities x 4 within normal limits. Patient is calm and cooperative. Patient did not display any disruptive behavior. Patient continues to be monitored on 15 minute safety checks. Will continue to monitor patient and notify MD of any changes.
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
## 2016-11-14 NOTE — Progress Notes (Signed)
D: Pt isolative with minimal socialization. Pt slightly more spontaneous in conversation.  Pt has very poor hygiene and is malodorous.  Pt encouraged to shower and reminded that he is meeting with a group home staff tomorrow and improved hygiene is recommended.  Pt states he agrees but did not shower. Pt denies s/i, h/i or hallucinations though appears preoccupied. A: Pt encouraged to participate and to attend to ADL's.  Monitored on 15 minute safety checks and maintained safety. R: Poor hygiene and malodorous.  Slightly improved mood.  Monitor safety and cont tx plan.

## 2016-11-14 NOTE — Plan of Care (Signed)
Problem: Nutritional: Goal: Maintenance of adequate nutrition will improve Outcome: Progressing Continue to encourage and monitor patient nutrition intake to ensure he does not have hypo/hyper-glycemic episodes during hospitalization. Will continue to monitor patient and notify MD of any changes.

## 2016-11-14 NOTE — [facility] Group Notes (Signed)
[facility] LCSW Group Therapy  11/14/2016 1:53 PM  Type of Therapy:  Group Therapy  Participation Level:  Patient did not attend group. CSW invited patient to group.   Summary of Progress/Problems: Coping Skills: Patients defined and discussed healthy coping skills. Patients identified healthy coping skills they would like to try during hospitalization and after discharge. CSW offered insight to varying coping skills that may have been new to patients such as practicing mindfulness.  Tony Cunningham MSW, LCSWA 11/14/2016, 1:53 PM

## 2016-11-14 NOTE — Plan of Care (Signed)
Problem: Education: Goal: Ability to state activities that reduce stress will improve Outcome: Not Progressing Not cognitively able to do this

## 2016-11-15 LAB — GLUCOSE, CAPILLARY
GLUCOSE-CAPILLARY: 119 mg/dL — AB (ref 65–99)
Glucose-Capillary: 134 mg/dL — ABNORMAL HIGH (ref 65–99)
Glucose-Capillary: 172 mg/dL — ABNORMAL HIGH (ref 65–99)
Glucose-Capillary: 215 mg/dL — ABNORMAL HIGH (ref 65–99)
Glucose-Capillary: 280 mg/dL — ABNORMAL HIGH (ref 65–99)
Glucose-Capillary: 71 mg/dL (ref 65–99)

## 2016-11-15 MED ORDER — NON FORMULARY: 10.0000 mg | Freq: Every day | Status: DC

## 2016-11-15 MED ORDER — MELATONIN 5 MG PO TABS
10.0000 mg | ORAL_TABLET | Freq: Every day | ORAL | Status: AC
  Administered 2016-11-15 – 2016-11-20 (×6): 10 mg via ORAL
  Filled 2016-11-15 (×6): qty 2
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
## 2016-11-15 NOTE — Progress Notes (Signed)
Patient ID: Tony Cunningham, male   DOB: 17-Dec-1975, 41 y.o.   MRN: [id number]  [employer] PROGRESS NOTE  Tony Cunningham BPZ:[id number] DOB: 08/03/1975 DOA: 11/06/2016 PCP: Patient, No Pcp Per  HPI/Subjective:   Objective: Vitals:   11/14/16 0631 11/15/16 0658  BP: 103/74 123/85  Pulse: 88 83  Resp:  18  Temp: 98.2 F (36.8 C) 98 F (36.7 C)    Filed Weights   11/06/16 2100 11/13/16 0930  Weight: 78 kg (172 lb) 77.1 kg (170 lb)    ROS: Review of Systems  Unable to perform ROS: Psychiatric disorder  Respiratory: Negative for shortness of breath.   Cardiovascular: Negative for chest pain.  Gastrointestinal: Negative for abdominal pain.  Musculoskeletal: Positive for joint pain.   Exam: Physical Exam  HENT:  Nose: No mucosal edema.  Mouth/Throat: No oropharyngeal exudate or posterior oropharyngeal edema.  Eyes: Conjunctivae, EOM and lids are normal. Pupils are equal, round, and reactive to light.  Neck: No JVD present. Carotid bruit is not present. No edema present. No thyroid mass and no thyromegaly present.  Cardiovascular: S1 normal and S2 normal.  Exam reveals no gallop.   No murmur heard. Pulses:      Dorsalis pedis pulses are 2+ on the right side, and 2+ on the left side.  Respiratory: No respiratory distress. He has no wheezes. He has no rhonchi. He has no rales.  GI: Soft. Bowel sounds are normal. There is no tenderness.  Musculoskeletal:       Right ankle: He exhibits no swelling.       Left ankle: He exhibits no swelling.  Lymphadenopathy:    He has no cervical adenopathy.  Neurological: He is alert. No cranial nerve deficit.  Skin: Skin is warm. No rash noted. Nails show no clubbing.  Psychiatric:  Answers some simple yes or no questions      Data Reviewed: Basic Metabolic Panel:  Recent Labs Lab 11/10/16 1959  NA 139  K 4.0  CL 103  CO2 29  GLUCOSE 109*  BUN 14  CREATININE 0.54*  CALCIUM 9.1   Liver Function Tests:  Recent Labs Lab  11/10/16 1959  AST 21  ALT 25  ALKPHOS 55  BILITOT 0.7  PROT 6.4*  ALBUMIN 3.6   CBC:  Recent Labs Lab 11/10/16 1959 11/11/16 0733  WBC 7.1 8.6  NEUTROABS 3.3 4.2  HGB 15.3 14.8  HCT 43.0 40.9  MCV 88.1 86.7  PLT 169 169   CBG:  Recent Labs Lab 11/14/16 1626 11/14/16 2052 11/15/16 0321 11/15/16 0630 11/15/16 1123  GLUCAP 139* 208* 280* 119* 134*     Scheduled Meds: . cloZAPine  225 mg Oral QPC supper  . feeding supplement (GLUCERNA SHAKE)  237 mL Oral TID BM  . fluvoxaMINE  50 mg Oral BID  . insulin aspart  0-15 Units Subcutaneous TID WC  . insulin aspart  10 Units Subcutaneous TID WC  . insulin glargine  55 Units Subcutaneous QHS  . lisinopril  20 mg Oral Daily  . Melatonin  10 mg Oral q1800  . meloxicam  7.5 mg Oral BID PC  . metFORMIN  1,000 mg Oral BID WC  . montelukast  10 mg Oral QHS  . nicotine  7 mg Transdermal Daily  . polyethylene glycol  17 g Oral Daily  . propranolol  20 mg Oral BID  . simvastatin  40 mg Oral q1800   Continuous Infusions: . dextrose 5 % and 0.45% NaCl 10 mL/hr at 11/13/16  1009    Assessment/Plan:  1. Uncontrolled diabetes with hyperglycemia. Hemoglobin A1c 10.3. Patient is on 55 units of glargine insulin and 10 units prior to meals. Sugars have trended quite a bit better. The patient has poor insight into taking care of himself with diabetes. He will need to be at a setting where they administer medication. Patient also on metformin 2. Essential hypertension. Blood pressure stable on lisinopril and propranolol 3. Hyperlipidemia unspecified on simvastatin 4. History of asthma. Respiratory status stable 5. Schizoaffective disorder with paranoia and suicidal thoughts. Continue psychiatric medication as per psychiatrist. Patient also receiving ECT treatments.  Code Status:     Code Status Orders        Start     Ordered   11/06/16 2210  Full code  Continuous     11/06/16 2209    Code Status History    Date Active  Date Inactive Code Status Order ID Comments User Context   10/08/2016 11:19 PM 10/20/2016  8:26 PM Full Code [id number]  [name], MD Inpatient   09/23/2016 11:04 PM 10/03/2016  4:29 PM Full Code [id number]  [name], MD Inpatient   07/24/2016  4:42 PM 07/26/2016  1:51 AM Full Code [id number]  Liberty Handy, PA-C ED   07/06/2016  4:56 PM 07/17/2016  2:29 PM Full Code [id number]  [name], MD Inpatient   02/07/2016  9:10 PM 02/21/2016  2:39 PM Full Code [id number]  Truman Hayward, FNP Inpatient     Disposition Plan: As per psychiatry  Consultants:  Psychiatry  Time spent: 23 minutes  Alford Highland  [employer]

## 2016-11-15 NOTE — Progress Notes (Signed)
Patient was visible in the milieu, he brightens on approach, medication compliant,. No behavioral issues or changes on shift. He just complained about being hungry during the night, blood sugar was checked and is sugar was somewhat elevated. He appears to be resting in bed quietly at this time.

## 2016-11-15 NOTE — [facility] Group Notes (Signed)
[facility] Group Notes:  (Nursing/MHT/Case Management/Adjunct)  Date:  11/15/2016  Time:  10:33 PM  Type of Therapy:  Psychoeducational Skills  Participation Level:  Did Not Attend  Summary of Progress/Problems:  Tony Cunningham 11/15/2016, 10:33 PM

## 2016-11-15 NOTE — Plan of Care (Signed)
Problem: Safety: Goal: Periods of time without injury will increase Outcome: Progressing Pt safe on the unit at this time   

## 2016-11-15 NOTE — Progress Notes (Signed)
Patient ID: Tony Cunningham, male   DOB: 09/10/1975, 41 y.o.   MRN: [id number]   Case Management Update 11/15/2016  [facility]- CSW spoke with [name] (Group Home manager [phone]). Stated that she does have a opening for a male. Confirmed that FL2 was received. Group home will be coming to see patient on 11/16/2016 at 11:30am for group home interview.   Anu Stagner G. Garnette Czech MSW, [facility] 11/15/2016 12:49 PM

## 2016-11-15 NOTE — Plan of Care (Signed)
Problem: Activity: Goal: Interest or engagement in activities will improve Outcome: Not Progressing Patient appears to not have much interest or to be engage in unit activities and is withdrawn to room most of shift, but with encouragement patient will come out of room and sit in dayroom for a little while for going back to bed. Will continue to monitor.

## 2016-11-15 NOTE — [facility] Group Notes (Signed)
[facility] LCSW Group Therapy  11/15/2016 2:33 PM  Type of Therapy:  Group Therapy  Participation Level:  Patient did not attend group. CSW invited patient to group.   Summary of Progress/Problems: Communications: Patients identify how individuals communicate with one another appropriately and inappropriately. Patients will be guided to discuss their thoughts, feelings, and behaviors related to barriers when communicating. The group will process together ways to execute positive and appropriate communications.   Smrithi Pigford G. Garnette Czech MSW, LCSWA 11/15/2016, 2:34 PM

## 2016-11-15 NOTE — Progress Notes (Signed)
Patient was withdrawn to room, but was easily arousible and interacted with staff. Patient is alert and oriented x 4, breathing unlabored, and extremities x 4 within normal limits. Patient is calm and cooperative. Patient did not display any disruptive behavior. Patient denies any SI/HI/AH/VH. Patient continues to be monitored on 15 minute safety checks. Will continue to monitor patient and notify MD of any changes.

## 2016-11-16 LAB — CLOZAPINE (CLOZARIL)
CLOZAPINE LVL: 748 ng/mL — AB (ref 350–650)
NORCLOZAPINE: 182 ng/mL
Total(Cloz+Norcloz): 930 ng/mL

## 2016-11-16 LAB — GLUCOSE, CAPILLARY
GLUCOSE-CAPILLARY: 141 mg/dL — AB (ref 65–99)
GLUCOSE-CAPILLARY: 197 mg/dL — AB (ref 65–99)
Glucose-Capillary: 240 mg/dL — ABNORMAL HIGH (ref 65–99)
Glucose-Capillary: 247 mg/dL — ABNORMAL HIGH (ref 65–99)
Glucose-Capillary: 199 mg/dL — ABNORMAL HIGH (ref 65–99)

## 2016-11-16 MED ORDER — FLUVOXAMINE MALEATE 50 MG PO TABS
100.0000 mg | ORAL_TABLET | Freq: Every day | ORAL | Status: DC
  Administered 2016-11-17 – 2016-12-07 (×21): 100 mg via ORAL
  Filled 2016-11-16 (×21): qty 2

## 2016-11-16 MED ORDER — CLOZAPINE 100 MG PO TABS
100.0000 mg | ORAL_TABLET | ORAL | Status: AC
  Administered 2016-11-16: 100 mg via ORAL

## 2016-11-16 MED ORDER — CLOZAPINE 100 MG PO TABS
200.0000 mg | ORAL_TABLET | Freq: Every day | ORAL | Status: DC
  Filled 2016-11-16: qty 2

## 2016-11-16 MED ORDER — CLOZAPINE 100 MG PO TABS: 250.0000 mg | ORAL_TABLET | Freq: Every day | ORAL | Status: DC

## 2016-11-16 NOTE — Progress Notes (Signed)
D: Pt denies SI/HI/AVH. Pt is pleasant and cooperative. Pt has isolated to room entire evening. Pt continues to present with poor hygiene even after encouraged to shower and wash his clothes.   A: Pt was offered support and encouragement. Pt was given scheduled medications. Pt was encourage to attend groups. Q 15 minute checks were done for safety.   R: safety maintained on unit.
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
## 2016-11-16 NOTE — Progress Notes (Signed)
Pt very isolative and withdrawn to room.Does not interact much with staff.  Pt denies current SI, HI, a/v hallucinations. He is compliant with all medications and insulin. Pt does not attend groups when asked to get up out of bed. Pt appears to have a irritable mood stated he feels "weak and tired". Blood sugars checked and pt stable. Will continue to monitor.

## 2016-11-16 NOTE — Progress Notes (Signed)
Got pt up and gave him a shower, changed his clothes, put in to be washed

## 2016-11-16 NOTE — Plan of Care (Signed)
Problem: Education: Goal: Emotional status will improve Outcome: Not Progressing Pt appears to be in a depressed mood withdrawn to room. Pt is isolative does not interact much with staff & peers.

## 2016-11-16 NOTE — Progress Notes (Signed)
Recreation Therapy Notes  Date: 06.25.18 Time: 9:30 am Location: Craft Room  Group Topic: Self-expression  Goal Area(s) Addresses:  Patient will identify one color per emotion listed on wheel. Patient will verbalize benefit of using art as a means of self-expression. Patient will verbalize one emotion experienced during session. Patient will be educated on other forms of self-expression.  Behavioral Response: Did not attend  Intervention: Emotion Wheel  Activity: Patients were given an Emotion Wheel worksheet and were instructed to pick a color for each emotion listed on the wheel.  Education: LRT educated patients on other forms of self-expression.  Education Outcome: Patient did not attend group.   Clinical Observations/Feedback: Patient did not attend group.  Jacquelynn Cree, LRT/CTRS 11/16/2016 10:14 AM

## 2016-11-16 NOTE — Progress Notes (Addendum)
Patient ID: Tony Cunningham, male   DOB: 08-31-1975, 41 y.o.   MRN: [id number] Chart and labs reviewed, Clozaril level (748) is unusually high, both WBC=8.6 and Neutrophils=49 are WNL; no drooling, no new onset of TD; patient is also on Luvox that displaces Clozaril into the serum because of the same receptors; Dr. Demetrius Charity modified Clozaril order from 200 mg to 100 mg for tonight and will review tomorrow. CBG=199

## 2016-11-16 NOTE — Progress Notes (Signed)
Pt appears to be sleeping a lot more during the day . Appears to be Somnolent

## 2016-11-17 LAB — GLUCOSE, CAPILLARY
GLUCOSE-CAPILLARY: 233 mg/dL — AB (ref 65–99)
GLUCOSE-CAPILLARY: 195 mg/dL — AB (ref 65–99)
GLUCOSE-CAPILLARY: 177 mg/dL — AB (ref 65–99)
Glucose-Capillary: 307 mg/dL — ABNORMAL HIGH (ref 65–99)

## 2016-11-17 MED ORDER — PANTOPRAZOLE SODIUM 40 MG PO TBEC
40.0000 mg | DELAYED_RELEASE_TABLET | Freq: Every day | ORAL | Status: DC
  Administered 2016-11-17 – 2016-12-08 (×23): 40 mg via ORAL
  Filled 2016-11-17 (×23): qty 1

## 2016-11-17 NOTE — [facility] Group Notes (Signed)
[facility] LCSW Group Therapy Note  Date/Time: 11/17/2016, 3:00PM  Type of Therapy/Topic:  Group Therapy:  Feelings about Diagnosis  Participation Level:  Did Not Attend    Description of Group:    This group will allow patients to explore their thoughts and feelings about diagnoses they have received. Patients will be guided to explore their level of understanding and acceptance of these diagnoses. Facilitator will encourage patients to process their thoughts and feelings about the reactions of others to their diagnosis, and will guide patients in identifying ways to discuss their diagnosis with significant others in their lives. This group will be process-oriented, with patients participating in exploration of their own experiences as well as giving and receiving support and challenge from other group members.   Therapeutic Goals: 1. Patient will demonstrate understanding of diagnosis as evidence by identifying two or more symptoms of the disorder:  2. Patient will be able to express two feelings regarding the diagnosis 3. Patient will demonstrate ability to communicate their needs through discussion and/or role plays   Therapeutic Modalities:   Cognitive Behavioral Therapy Brief Therapy Feelings Identification   Hampton Abbot, MSW, LCSW-A 11/17/2016, 3:57PM

## 2016-11-17 NOTE — Progress Notes (Signed)
Recreation Therapy Notes  Date: 06.26.18 Time: 9:30 am Location: Craft Room  Group Topic: Goal Setting  Goal Area(s) Addresses:  Patient will write at least one goal. Patient will write at least one obstacle.  Behavioral Response: Did not attend  Intervention: Recovery Goal Chart  Activity: Patients were instructed to make a Recovery Goal Chart including their goals, obstacles, the date they started working on their goals, and the date they achieved their goals.  Education: LRT educated patients on healthy ways to celebrate reaching their goals.  Education Outcome: Patient did not attend group.  Clinical Observations/Feedback: Patient did not attend group.  Jacquelynn Cree, LRT/CTRS 11/17/2016 10:18 AM

## 2016-11-17 NOTE — Progress Notes (Addendum)
[facility] MD Progress Note  11/17/2016 9:54 AM Tony Cunningham  MRN:  [id number]  Subjective:   Tony Cunningham is a 41 year old male with prior diagnosis of schizoaffective disorder who initially came to the emergency room endorsing feelings of paranoia and vague suicidal thoughts as he did not feel safe living in a group home. The patient is endorsing some very vague paranoid thoughts and psychotic symptoms including auditory and visual hallucinations.  11/08/16. The patient has been very isolative and withdrawn. Hygiene is poor and he is malodorous. He has not showered. He did go outside with the other patients and has been getting up for meals but not attending groups or interacting with staff or peers. He denies any current active or passive suicidal thoughts but does admit to some very vague paranoid thoughts that others are watching him. He is very limited and talking with this Clinical research associate and wanted to be "left alone to sleep". He denied any current somatic complaints. Vital signs are stable and he slept fairly well last night but is also sleeping partially during the daytime. So far, he is tolerating the Clozaril fairly well without any physical adverse side effects.  Follow-up for this 41 year old man with what appears to be psychotic depression or schizoaffective disorder. Patient remains in bed most of the time. Poorly interactive. Mood is depressed. Passive suicidal thoughts.  Follow-up for Thursday the 21st. Patient remains withdrawn very little activity. Blood sugars are also running much higher. Patient continues to endorse feeling depressed and having vague suicidal thoughts.  Follow-up for Friday the 22nd. Patient finally had ECT this morning bilateral treatment which went off without any difficulty or complication. I saw him this afternoon and I think he actually was a little more interactive and verbal and energetic than before. This may be wishful thinking but it makes me optimistic. No specific  complaints other than headache  Patient was seen today for follow-up. Patient has no new complaints. Continues to say his thoughts are racing but his affect is flat and withdrawn he is almost completely withdrawn from other people and stays in his room by himself. Suicidal thoughts passive no intention or plan. Still has psychotic symptoms. Blood sugars unfortunately continued to run quite high.  6/23 patient denies having any issues or concerns today. Says that he has been eating and sleeping well. Denies side effects from medications or any physical complaints. Denies suicidality or homicidality. He says that he might be hallucinating but he is not sure. He said that he had 1 ECT procedure and it went well.   Blood glucose this morning was 92. Patient is compliant with medications. No major events overnight per nursing.  6/24 patient did not sleep well last night. Other than that he denies having any issues or concerns. He denies suicidality, homicidality or auditory visual hallucinations. During examination he appears much brighter and more conversational. His thought processes are linear.  11/16/2016. Tony Cunningham does not like talking to me today. He is in his room he is in bed He does not open his eyes for me and is unable to keep a conversation he keeps telling me okay okay and is unwilling to answer any questions. He received just one ECT treatment last week. Since Dr. Toni Amend is on vacation, ECT will resume next week.  He accepts medications and seems to tolerate them well Clozaril level is over 700. This is probably from a combination of clozapine and Luvox. I will alert dose of clozapine to 200 mg  To  avoid side effects. I will switch to Luvox to 1 nightly dose to improve sleep.  11/17/2016. Tony Cunningham is much more animated and interactive today. He is in his room in bed bugs is able to hold a conversation and maintains good eye contact. He complains of stomach ache. He did have a bowel movement this  morning. His mood is still depressed and he needs much encouragement to attend to his ADLs. Staff needs to strongly encourage showering. He has high hopes for ECT and will not stay in the hospital until next week to resume ECT with Dr. Toni Amend Is back from vacation. Yesterday he is agreeable to the nighttime and clozapine dose decrease due to high clozapine level. This could have helped the patient stated more awake this morning. He slept almost 6 hours.  Per nursing: Chart and labs reviewed, Clozaril level (748) is unusually high, both WBC=8.6 and Neutrophils=49 are WNL; no drooling, no new onset of TD; patient is also on Luvox that displaces Clozaril into the serum because of the same receptors; Dr. Demetrius Charity modified Clozaril order from 200 mg to 100 mg for tonight and will review tomorrow. CBG=199  Principal Problem: Schizoaffective disorder, depressive type (HCC) Diagnosis:   Patient Active Problem List   Diagnosis Date Noted  . Schizoaffective disorder, depressive type (HCC) [F25.1] 09/23/2016  . Polysubstance abuse [F19.10] 07/17/2016  . Tardive dyskinesia [G24.01] 07/16/2016  . Tobacco use disorder [F17.200] 07/07/2016  . Dyslipidemia [E78.5] 07/07/2016  . Asthma [J45.909] 07/07/2016  . HTN (hypertension) [I10] 07/06/2016  . Diabetes (HCC) [E11.9] 12/25/2010   Total Time spent with patient: 30 minutes    Past Medical History:  Past Medical History:  Diagnosis Date  . Anxiety   . Asthma   . Diabetes mellitus   . High blood pressure   . Sinus complaint    History reviewed. No pertinent surgical history.  Social History:  History  Alcohol Use No     History  Drug Use No    Social History   Social History  . Marital status: Single    Spouse name: [name]  . Number of children: [name]  . Years of education: [name]   Social History Main Topics  . Smoking status: Current Every Day Smoker    Packs/day: 0.50    Types: Cigarettes  . Smokeless tobacco: Never Used  . Alcohol use No  .  Drug use: No  . Sexual activity: Not Currently   Other Topics Concern  . None   Social History Narrative  . None   Additional Social History:                         Sleep: Good  Appetite:  Good  Current Medications: Current Facility-Administered Medications  Medication Dose Route Frequency Provider Last Rate Last Dose  . acetaminophen (TYLENOL) tablet 650 mg  650 mg Oral Q6H PRN [name], [name], MD      . alum & mag hydroxide-simeth (MAALOX/MYLANTA) 200-200-20 MG/5ML suspension 30 mL  30 mL Oral Q4H PRN [name], [name], MD      . dextrose 5 %-0.45 % sodium chloride infusion   Intravenous Continuous [name], [name], MD 10 mL/hr at 11/13/16 1009    . feeding supplement (GLUCERNA SHAKE) (GLUCERNA SHAKE) liquid 237 mL  237 mL Oral TID BM Enedina Finner, MD   237 mL at 11/16/16 2020  . fluvoxaMINE (LUVOX) tablet 100 mg  100 mg Oral QHS Argil Mahl B, MD      .  insulin aspart (novoLOG) injection 0-15 Units  0-15 Units Subcutaneous TID WC [name], [name], MD   5 Units at 11/17/16 [phone]  . insulin aspart (novoLOG) injection 10 Units  10 Units Subcutaneous TID WC Enedina Finner, MD   10 Units at 11/17/16 0744  . insulin glargine (LANTUS) injection 55 Units  55 Units Subcutaneous QHS Enedina Finner, MD   55 Units at 11/16/16 2145  . lisinopril (PRINIVIL,ZESTRIL) tablet 20 mg  20 mg Oral Daily [name], [name], MD   20 mg at 11/17/16 0750  . magnesium hydroxide (MILK OF MAGNESIA) suspension 30 mL  30 mL Oral Daily PRN [name], [name], MD      . Melatonin TABS 10 mg  10 mg Oral q1800 [name], MD   10 mg at 11/16/16 2018  . meloxicam (MOBIC) tablet 7.5 mg  7.5 mg Oral BID PC [name], [name], MD   7.5 mg at 11/17/16 0900  . metFORMIN (GLUCOPHAGE) tablet 1,000 mg  1,000 mg Oral BID WC [name], [name], MD   1,000 mg at 11/17/16 0751  . montelukast (SINGULAIR) tablet 10 mg  10 mg Oral QHS [name], [name], MD   10 mg at 11/16/16 2145  . nicotine (NICODERM CQ - dosed in mg/24  hr) patch 7 mg  7 mg Transdermal Daily Darliss Ridgel, MD   7 mg at 11/17/16 0744  . pantoprazole (PROTONIX) EC tablet 40 mg  40 mg Oral Daily Brylen Wagar B, MD      . polyethylene glycol (MIRALAX / GLYCOLAX) packet 17 g  17 g Oral Daily [name], [name], MD   17 g at 11/17/16 0742  . promethazine (PHENERGAN) tablet 12.5 mg  12.5 mg Oral Q6H PRN [name], [name], MD      . propranolol (INDERAL) tablet 20 mg  20 mg Oral BID [name], [name], MD   20 mg at 11/17/16 0751  . simvastatin (ZOCOR) tablet 40 mg  40 mg Oral q1800 [name], [name], MD   40 mg at 11/16/16 2018    Lab Results:  Results for orders placed or performed during the hospital encounter of 11/06/16 (from the past 48 hour(s))  Glucose, capillary     Status: Abnormal   Collection Time: 11/15/16 11:23 AM  Result Value Ref Range   Glucose-Capillary 134 (H) 65 - 99 mg/dL  Glucose, capillary     Status: Abnormal   Collection Time: 11/15/16  3:40 PM  Result Value Ref Range   Glucose-Capillary 215 (H) 65 - 99 mg/dL  Glucose, capillary     Status: None   Collection Time: 11/15/16  8:39 PM  Result Value Ref Range   Glucose-Capillary 71 65 - 99 mg/dL   Comment 1 Notify RN    Comment 2 Document in Chart   Glucose, capillary     Status: Abnormal   Collection Time: 11/15/16 11:50 PM  Result Value Ref Range   Glucose-Capillary 172 (H) 65 - 99 mg/dL  Glucose, capillary     Status: Abnormal   Collection Time: 11/16/16  7:14 AM  Result Value Ref Range   Glucose-Capillary 141 (H) 65 - 99 mg/dL   Comment 1 Notify RN    Comment 2 Document in Chart   Glucose, capillary     Status: Abnormal   Collection Time: 11/16/16 11:37 AM  Result Value Ref Range   Glucose-Capillary 247 (H) 65 - 99 mg/dL  Glucose, capillary     Status: Abnormal   Collection  Time: 11/16/16  4:32 PM  Result Value Ref Range   Glucose-Capillary 197 (H) 65 - 99 mg/dL  Glucose, capillary     Status: Abnormal   Collection Time: 11/16/16  7:46 PM  Result Value Ref  Range   Glucose-Capillary 199 (H) 65 - 99 mg/dL  Glucose, capillary     Status: Abnormal   Collection Time: 11/17/16  6:44 AM  Result Value Ref Range   Glucose-Capillary 233 (H) 65 - 99 mg/dL    Blood Alcohol level:  Lab Results  Component Value Date   ETH <5 10/21/2016   ETH <5 10/07/2016    Metabolic Disorder Labs: Lab Results  Component Value Date   HGBA1C 10.3 (H) 11/09/2016   MPG 249 11/09/2016   MPG 275 10/09/2016   Lab Results  Component Value Date   PROLACTIN 24.5 (H) 09/24/2016   PROLACTIN 3.4 (L) 07/07/2016   Lab Results  Component Value Date   CHOL 92 11/09/2016   TRIG 54 11/09/2016   HDL 39 (L) 11/09/2016   CHOLHDL 2.4 11/09/2016   VLDL 11 11/09/2016   LDLCALC 42 11/09/2016   LDLCALC 72 10/09/2016    Physical Findings: AIMS: Facial and Oral Movements Muscles of Facial Expression: Minimal Lips and Perioral Area: Mild Jaw: Minimal Tongue: Minimal,Extremity Movements Upper (arms, wrists, hands, fingers): None, normal Lower (legs, knees, ankles, toes): Minimal, Trunk Movements Neck, shoulders, hips: None, normal, Overall Severity Severity of abnormal movements (highest score from questions above): Minimal Incapacitation due to abnormal movements: Minimal Patient's awareness of abnormal movements (rate only patient's report): No Awareness, Dental Status Current problems with teeth and/or dentures?: No Does patient usually wear dentures?: No   Musculoskeletal: Strength & Muscle Tone: within normal limits Gait & Station: normal Patient leans: [name]  Psychiatric Specialty Exam: Physical Exam  Nursing note and vitals reviewed. Constitutional: He appears well-developed and well-nourished.  HENT:  Head: Normocephalic and atraumatic.  Eyes: Conjunctivae are normal. Pupils are equal, round, and reactive to light.  Neck: Normal range of motion.  Cardiovascular: Regular rhythm and normal heart sounds.   Respiratory: Effort normal.  GI: Soft.   Musculoskeletal: Normal range of motion.  Neurological: He is alert.  Skin: Skin is warm and dry.  Psychiatric: His affect is blunt. His speech is delayed. He is slowed. Thought content is paranoid and delusional. Cognition and memory are normal. He expresses impulsivity.    Review of Systems  Constitutional: Negative.   HENT: Negative.   Eyes: Negative.   Cardiovascular: Negative.   Gastrointestinal: Negative.   Genitourinary: Negative.   Musculoskeletal: Negative.   Skin: Negative.   Neurological: Negative.   Endo/Heme/Allergies: Negative.   Psychiatric/Behavioral: Positive for depression and hallucinations.    Blood pressure 108/78, pulse 85, temperature 98.9 F (37.2 C), temperature source Oral, resp. rate 18, height 5\' 8"  (1.727 m), weight 77.1 kg (170 lb), SpO2 96 %.Body mass index is 25.85 kg/m.  General Appearance: Disheveled  Eye Contact:  Poor  Speech:  Slow  Volume:  Decreased  Mood:  Dysphoric  Affect:  Blunt  Thought Process:  Linear  Orientation:  Full (Time, Place, and Person)  Thought Content:  Paranoid Ideation  Suicidal Thoughts:  No  Homicidal Thoughts:  No  Memory:  Immediate;   Fair Recent;   Fair Remote;   Fair  Judgement:  Impaired  Insight:  Lacking  Psychomotor Activity:  Decreased  Concentration:  Concentration: Fair and Attention Span: Fair  Recall:  of Knowledge:  Fair  Language:  Good  Akathisia:  No  Handed:  Right  AIMS (if indicated):     Assets:  Housing Physical Health  ADL's:  Intact  Cognition:  WNL  Sleep:  Number of Hours: 5.45     Treatment Plan Summary:  Tony Cunningham 41 year old male with prior diagnosis of schizoaffective disorder who presented to the emergency room with vague suicidal thoughts as well as paranoid thoughts. He does. Be responding to internal stimuli and has endorsed some auditory and visual hallucinations. He will be admitted to inpatient psychiatry for medication management, safety and  stabilization.  1. Schizoaffective disorder, depressive type. We started Clozaril and increased to 200mg  nightly for psychosis. Clozapine level 748. We will lover Clozapine dose to 100. He is also on Luvox 100 mg nightly which interferes with Clozapine metabolism.   2. ECT. Patient had ECT on Friday,11/13/2016 for the first time in this hospitalization. I am optimistic that this treatment may make a real difference. Unfortunately I will be away from the hospital all of next week. I am leaving the patient in the care of Dr. Demetrius Charity and will sign out to her. If the patient is still in the hospital after this next week we will plan to continue ECT treatment picking up again on July 2.   3. Metabolic syndrome monitoring. Lipid panel is normal, hemoglobin A1c 103.  4. EKG. Normal sinus rhythm, QTc 417.  5. Questionable history of polysubstance abuse: The patient denies any history of any polysubstance use but records indicate that there are may have been a history of polysubstance use. He was advised to abstain from alcohol and all illicit drugs as it may worsen anxiety mood symptoms as well as psychosis. The patient is not interested in any substance abuse treatment.  6. Nicotine use disorder. Nicotine patch is available.   7. Diabetes. He is on Metformin, Novolog 10 units with meals and Lantus 55 units, along with ADA diet and blood sugar monitoring. Medicine input is greatly appreciated.  8. Hypertension: Vital signs are stable. We'll continue lisinopril 20 mg daily.  9. ,Seasonal allergies: Continue Singulair 10 mg nightly  10. Anxiety/restlessness. He is on Propranolol.   11. Insomnia. Melatonin.  12. Disposition: The patient will return to a group home. He will follow up with [facility] in [facility]. Consider ACT team.  I certify that the services received since the previous certification/recertification were and continue to be medically necessary as the treatment provided can be reasonably  expected to improve the patient's condition; the medical record documents that the services furnished were intensive treatment services or their equivalent services, and this patient continues to need, on a daily basis, active treatment furnished directly by or requiring the supervision of inpatient psychiatric personnel.   Kristine Linea, MD 11/17/2016, 9:54 AM

## 2016-11-17 NOTE — Plan of Care (Signed)
Problem: Activity: Goal: Sleeping patterns will improve Outcome: Progressing Patient slept for Estimated Hours of 5.45; q15 minutes safety round maintained, no injury or falls during this shift.    

## 2016-11-17 NOTE — Progress Notes (Signed)
Patient continues to isolate self to room. Alert and responsive, up for meals and medications. Patient encouraged by staff get up and go into lounge and interact with peers. Affect flat, patient remains safe on unit with q 15 minute safety checks. BS checks monitored and pt medicated per insulin policy.

## 2016-11-17 NOTE — [facility] Group Notes (Signed)
  Goals Group Date/Time: 11/17/2016 9:00 AM Type of Therapy and Topic: Group Therapy: Goals Group: SMART Goals   Participation Level: DID NOT ATTEND    Glennon Mac, LCSW 11/17/2016, 5:16 PM

## 2016-11-17 NOTE — Progress Notes (Signed)
Patient ID: Emerick Weatherly, male   DOB: 01-Feb-1976, 41 y.o.   MRN: [id number]  Patient ID: Shone Leventhal, male   DOB: 15-Feb-1976, 41 y.o.   MRN: [id number]  [employer] PROGRESS NOTE  Shown Dissinger QHU:[id number] DOB: 08-01-1975 DOA: 11/06/2016 PCP: Patient, No Pcp Per  HPI/Subjective: Patient feels okay. States he's doing better. States he has a good appetite.  Objective: Vitals:   11/16/16 2244 11/17/16 0659  BP: 108/65 108/78  Pulse: 85 85  Resp: 16 18  Temp: 97.8 F (36.6 C) 98.9 F (37.2 C)    Filed Weights   11/06/16 2100 11/13/16 0930  Weight: 78 kg (172 lb) 77.1 kg (170 lb)    ROS: Review of Systems  Unable to perform ROS: Psychiatric disorder  Constitutional: Negative for fever.  Respiratory: Negative for shortness of breath.   Cardiovascular: Negative for chest pain.  Gastrointestinal: Negative for abdominal pain.  Musculoskeletal: Negative for joint pain.   Exam: Physical Exam  HENT:  Nose: No mucosal edema.  Mouth/Throat: No oropharyngeal exudate or posterior oropharyngeal edema.  Eyes: Conjunctivae, EOM and lids are normal. Pupils are equal, round, and reactive to light.  Neck: No JVD present. Carotid bruit is not present. No edema present. No thyroid mass and no thyromegaly present.  Cardiovascular: S1 normal and S2 normal.  Exam reveals no gallop.   No murmur heard. Pulses:      Dorsalis pedis pulses are 2+ on the right side, and 2+ on the left side.  Respiratory: No respiratory distress. He has no wheezes. He has no rhonchi. He has no rales.  GI: Soft. Bowel sounds are normal. There is no tenderness.  Musculoskeletal:       Right ankle: He exhibits no swelling.       Left ankle: He exhibits no swelling.  Lymphadenopathy:    He has no cervical adenopathy.  Neurological: He is alert. No cranial nerve deficit.  Skin: Skin is warm. No rash noted. Nails show no clubbing.  Psychiatric:  Answers some simple yes or no questions      Data Reviewed: Basic  Metabolic Panel:  Recent Labs Lab 11/10/16 1959  NA 139  K 4.0  CL 103  CO2 29  GLUCOSE 109*  BUN 14  CREATININE 0.54*  CALCIUM 9.1   Liver Function Tests:  Recent Labs Lab 11/10/16 1959  AST 21  ALT 25  ALKPHOS 55  BILITOT 0.7  PROT 6.4*  ALBUMIN 3.6   CBC:  Recent Labs Lab 11/10/16 1959 11/11/16 0733  WBC 7.1 8.6  NEUTROABS 3.3 4.2  HGB 15.3 14.8  HCT 43.0 40.9  MCV 88.1 86.7  PLT 169 169   CBG:  Recent Labs Lab 11/16/16 1137 11/16/16 1632 11/16/16 1946 11/17/16 0644 11/17/16 1126  GLUCAP 247* 197* 199* 233* 307*     Scheduled Meds: . feeding supplement (GLUCERNA SHAKE)  237 mL Oral TID BM  . fluvoxaMINE  100 mg Oral QHS  . insulin aspart  0-15 Units Subcutaneous TID WC  . insulin aspart  10 Units Subcutaneous TID WC  . insulin glargine  55 Units Subcutaneous QHS  . lisinopril  20 mg Oral Daily  . Melatonin  10 mg Oral q1800  . meloxicam  7.5 mg Oral BID PC  . metFORMIN  1,000 mg Oral BID WC  . montelukast  10 mg Oral QHS  . nicotine  7 mg Transdermal Daily  . pantoprazole  40 mg Oral Daily  . polyethylene glycol  17 g Oral  Daily  . propranolol  20 mg Oral BID  . simvastatin  40 mg Oral q1800   Continuous Infusions: . dextrose 5 % and 0.45% NaCl 10 mL/hr at 11/13/16 1009    Assessment/Plan:  1. Uncontrolled diabetes with hyperglycemia. Hemoglobin A1c 10.3. Patient is on 55 units of glargine insulin and 10 units prior to meals. Sugars Trended up today because he is eating better. Watch sugars another day before adjusting medications. I did discuss diet and exercise with him The patient has poor insight into taking care of himself with diabetes. He will need to be at a setting where they administer medication. Patient also on metformin. 2. Essential hypertension. Blood pressure stable on lisinopril and propranolol 3. Hyperlipidemia unspecified on simvastatin 4. History of asthma. Respiratory status stable 5. Schizoaffective disorder with  paranoia and suicidal thoughts. Continue psychiatric medication as per psychiatrist. Patient also receiving ECT treatments.  Code Status:     Code Status Orders        Start     Ordered   11/06/16 2210  Full code  Continuous     11/06/16 2209    Code Status History    Date Active Date Inactive Code Status Order ID Comments User Context   10/08/2016 11:19 PM 10/20/2016  8:26 PM Full Code [id number]  [name], MD Inpatient   09/23/2016 11:04 PM 10/03/2016  4:29 PM Full Code [id number]  [name], MD Inpatient   07/24/2016  4:42 PM 07/26/2016  1:51 AM Full Code [id number]  Liberty Handy, PA-C ED   07/06/2016  4:56 PM 07/17/2016  2:29 PM Full Code [id number]  [name], MD Inpatient   02/07/2016  9:10 PM 02/21/2016  2:39 PM Full Code [id number]  Truman Hayward, FNP Inpatient     Disposition Plan: As per psychiatry  Consultants:  Psychiatry  Time spent: 23 minutes  Alford Highland  [employer]

## 2016-11-17 NOTE — Plan of Care (Signed)
Problem: Coping: Goal: Ability to cope will improve Outcome: Progressing Emotional support provided to patient.

## 2016-11-18 LAB — CBC WITH DIFFERENTIAL/PLATELET
Basophils Absolute: 0.1 10*3/uL (ref 0–0.1)
Basophils Relative: 1 %
EOS PCT: 7 %
Eosinophils Absolute: 0.5 10*3/uL (ref 0–0.7)
HCT: 40.1 % (ref 40.0–52.0)
Hemoglobin: 14.2 g/dL (ref 13.0–18.0)
Lymphocytes Relative: 38 %
Lymphs Abs: 2.5 10*3/uL (ref 1.0–3.6)
MCH: 30.5 pg (ref 26.0–34.0)
MCHC: 35.5 g/dL (ref 32.0–36.0)
MCV: 86.1 fL (ref 80.0–100.0)
MONO ABS: 0.4 10*3/uL (ref 0.2–1.0)
MONOS PCT: 6 %
NEUTROS ABS: 3.2 10*3/uL (ref 1.4–6.5)
Neutrophils Relative %: 48 %
PLATELETS: 181 10*3/uL (ref 150–440)
RBC: 4.66 MIL/uL (ref 4.40–5.90)
RDW: 12.4 % (ref 11.5–14.5)
WBC: 6.7 10*3/uL (ref 3.8–10.6)

## 2016-11-18 LAB — GLUCOSE, CAPILLARY
GLUCOSE-CAPILLARY: 321 mg/dL — AB (ref 65–99)
Glucose-Capillary: 194 mg/dL — ABNORMAL HIGH (ref 65–99)
Glucose-Capillary: 285 mg/dL — ABNORMAL HIGH (ref 65–99)
Glucose-Capillary: 206 mg/dL — ABNORMAL HIGH (ref 65–99)

## 2016-11-18 MED ORDER — TRAZODONE HCL 100 MG PO TABS
200.0000 mg | ORAL_TABLET | Freq: Every day | ORAL | Status: DC
  Administered 2016-11-18: 200 mg via ORAL
  Filled 2016-11-18: qty 2

## 2016-11-18 MED ORDER — INSULIN ASPART 100 UNIT/ML ~~LOC~~ SOLN
0.0000 [IU] | Freq: Every day | SUBCUTANEOUS | Status: DC
  Administered 2016-11-18: 4 [IU] via SUBCUTANEOUS
  Administered 2016-11-24: 3 [IU] via SUBCUTANEOUS
  Administered 2016-11-27: 5 [IU] via SUBCUTANEOUS
  Administered 2016-11-28: 4 [IU] via SUBCUTANEOUS
  Administered 2016-11-29: 5 [IU] via SUBCUTANEOUS
  Administered 2016-12-01 – 2016-12-02 (×2): 2 [IU] via SUBCUTANEOUS
  Administered 2016-12-03: 3 [IU] via SUBCUTANEOUS
  Administered 2016-12-05: 4 [IU] via SUBCUTANEOUS
  Administered 2016-12-06: 3 [IU] via SUBCUTANEOUS
  Filled 2016-11-18 (×8): qty 1

## 2016-11-18 MED ORDER — INSULIN ASPART 100 UNIT/ML ~~LOC~~ SOLN
0.0000 [IU] | Freq: Three times a day (TID) | SUBCUTANEOUS | Status: DC
  Administered 2016-11-18: 5 [IU] via SUBCUTANEOUS
  Administered 2016-11-19: 2 [IU] via SUBCUTANEOUS
  Administered 2016-11-19: 3 [IU] via SUBCUTANEOUS
  Administered 2016-11-19 – 2016-11-20 (×2): 8 [IU] via SUBCUTANEOUS
  Administered 2016-11-20: 3 [IU] via SUBCUTANEOUS
  Administered 2016-11-21: 2 [IU] via SUBCUTANEOUS
  Administered 2016-11-21: 5 [IU] via SUBCUTANEOUS
  Administered 2016-11-21: 3 [IU] via SUBCUTANEOUS
  Administered 2016-11-22 (×2): 2 [IU] via SUBCUTANEOUS
  Administered 2016-11-22: 3 [IU] via SUBCUTANEOUS
  Administered 2016-11-23: 8 [IU] via SUBCUTANEOUS
  Administered 2016-11-23 – 2016-11-24 (×3): 3 [IU] via SUBCUTANEOUS
  Administered 2016-11-24: 2 [IU] via SUBCUTANEOUS
  Administered 2016-11-25: 11 [IU] via SUBCUTANEOUS
  Administered 2016-11-25 – 2016-11-26 (×3): 2 [IU] via SUBCUTANEOUS
  Administered 2016-11-26 – 2016-11-28 (×3): 5 [IU] via SUBCUTANEOUS
  Administered 2016-11-28: 3 [IU] via SUBCUTANEOUS
  Administered 2016-11-29: 5 [IU] via SUBCUTANEOUS
  Administered 2016-11-29 – 2016-11-30 (×4): 3 [IU] via SUBCUTANEOUS
  Administered 2016-11-30: 5 [IU] via SUBCUTANEOUS
  Administered 2016-12-01: 11 [IU] via SUBCUTANEOUS
  Administered 2016-12-01: 3 [IU] via SUBCUTANEOUS
  Administered 2016-12-02: 2 [IU] via SUBCUTANEOUS
  Administered 2016-12-02 – 2016-12-03 (×2): 8 [IU] via SUBCUTANEOUS
  Administered 2016-12-03: 5 [IU] via SUBCUTANEOUS
  Administered 2016-12-03: 2 [IU] via SUBCUTANEOUS
  Administered 2016-12-04: 3 [IU] via SUBCUTANEOUS
  Administered 2016-12-04: 2 [IU] via SUBCUTANEOUS
  Administered 2016-12-05: 11 [IU] via SUBCUTANEOUS
  Administered 2016-12-05: 3 [IU] via SUBCUTANEOUS
  Administered 2016-12-06: 8 [IU] via SUBCUTANEOUS
  Administered 2016-12-06: 2 [IU] via SUBCUTANEOUS
  Administered 2016-12-06: 8 [IU] via SUBCUTANEOUS
  Administered 2016-12-07: 2 [IU] via SUBCUTANEOUS
  Administered 2016-12-08: 11 [IU] via SUBCUTANEOUS
  Filled 2016-11-18 (×24): qty 1

## 2016-11-18 NOTE — Plan of Care (Signed)
Problem: Coping: Goal: Ability to verbalize frustrations and anger appropriately will improve Outcome: Progressing Difficulty time  With comprehension  Encouragement  Given to patient

## 2016-11-18 NOTE — Progress Notes (Signed)
Clozapine [facility]  Labs reported to Clozapine program. Absolute neutrophil count of 3200.   Demetrius Charity, PharmD

## 2016-11-18 NOTE — [facility] Group Notes (Signed)
[facility] LCSW Group Therapy  11/18/2016 2:21 PM  Type of Therapy:  Group Therapy  Participation Level:  Patient did not attend group. CSW invited patient to group.   Summary of Progress/Problems: Emotional Regulation: Patients will identify both negative and positive emotions. They will discuss emotions they have difficulty regulating and how they impact their lives. Patients will be asked to identify healthy coping skills to combat unhealthy reactions to negative emotions.    Labib Cwynar G. Garnette Czech MSW, LCSWA 11/18/2016, 2:21 PM

## 2016-11-18 NOTE — Progress Notes (Signed)
Inpatient Diabetes Program Recommendations  AACE/ADA: New Consensus Statement on Inpatient Glycemic Control (2015)  Target Ranges:  Prepandial:   less than 140 mg/dL      Peak postprandial:   less than 180 mg/dL (1-2 hours)      Critically ill patients:  140 - 180 mg/dL   Results for TERRALL, BLEY (MRN [id number]) as of 11/18/2016 10:20  Ref. Range 11/17/2016 06:44 11/17/2016 11:26 11/17/2016 16:32 11/17/2016 20:17 11/18/2016 06:34  Glucose-Capillary Latest Ref Range: 65 - 99 mg/dL 884 (H) 166 (H) 063 (H) 177 (H) 194 (H)   Review of Glycemic Control  Current orders for Inpatient glycemic control: Lantus 55 units QHS, Novolog 0-15 units TID with meals, Novolog 10 units TID with meals, Metformin 1000 mg BID  Inpatient Diabetes Program Recommendations:  Insulin-Correction: Please consider ordering Novolog bedtime correction scale. Diet: If appropriate, please consider changing diet from REGULAR to CARB MODIFIED and re-evaluate need for Glucerna supplements since patient is eating 100% (per chart documentation).  Thanks, Orlando Penner, RN, MSN, CDE Diabetes Coordinator Inpatient Diabetes Program [phone] (Team Pager from 8am to 5pm)

## 2016-11-18 NOTE — Progress Notes (Signed)
Recreation Therapy Notes  Date: 06.27.18 Time: 9:30 am Location: Craft [facility]  Group Topic: Self-esteem  Goal Area(s) Addresses:  Patient will write at least one positive trait. Patient will verbalize benefit of having a healthy self-esteem.  Behavioral Response: Did not attend  Intervention: I Am  Activity: Patients were given a worksheet with the letter I on it and were instructed to write positive traits about themselves inside the letter.  Education: LRT educated patient on ways to increase their self-esteem.  Education Outcome: Patient did not attend group.  Clinical Observations/Feedback: Patient did not attend group.  Jacquelynn Cree, LRT/CTRS 11/18/2016 10:09 AM

## 2016-11-18 NOTE — Progress Notes (Signed)
[facility] MD Progress Note  11/18/2016 11:48 AM Dat Derksen  MRN:  [id number]  Subjective:   Mr. Cortese is a 41 year old male with prior diagnosis of schizoaffective disorder who initially came to the emergency room endorsing feelings of paranoia and vague suicidal thoughts as he did not feel safe living in a group home. The patient is endorsing some very vague paranoid thoughts and psychotic symptoms including auditory and visual hallucinations.  11/08/16. The patient has been very isolative and withdrawn. Hygiene is poor and he is malodorous. He has not showered. He did go outside with the other patients and has been getting up for meals but not attending groups or interacting with staff or peers. He denies any current active or passive suicidal thoughts but does admit to some very vague paranoid thoughts that others are watching him. He is very limited and talking with this Probation officer and wanted to be "left alone to sleep". He denied any current somatic complaints. Vital signs are stable and he slept fairly well last night but is also sleeping partially during the daytime. So far, he is tolerating the Clozaril fairly well without any physical adverse side effects.  Follow-up for this 41 year old man with what appears to be psychotic depression or schizoaffective disorder. Patient remains in bed most of the time. Poorly interactive. Mood is depressed. Passive suicidal thoughts.  Follow-up for Thursday the 21st. Patient remains withdrawn very little activity. Blood sugars are also running much higher. Patient continues to endorse feeling depressed and having vague suicidal thoughts.  Follow-up for Friday the 22nd. Patient finally had ECT this morning bilateral treatment which went off without any difficulty or complication. I saw him this afternoon and I think he actually was a little more interactive and verbal and energetic than before. This may be wishful thinking but it makes me optimistic. No specific  complaints other than headache  Patient was seen today for follow-up. Patient has no new complaints. Continues to say his thoughts are racing but his affect is flat and withdrawn he is almost completely withdrawn from other people and stays in his room by himself. Suicidal thoughts passive no intention or plan. Still has psychotic symptoms. Blood sugars unfortunately continued to run quite high.  6/23 patient denies having any issues or concerns today. Says that he has been eating and sleeping well. Denies side effects from medications or any physical complaints. Denies suicidality or homicidality. He says that he might be hallucinating but he is not sure. He said that he had 1 ECT procedure and it went well.   Blood glucose this morning was 92. Patient is compliant with medications. No major events overnight per nursing.  6/24 patient did not sleep well last night. Other than that he denies having any issues or concerns. He denies suicidality, homicidality or auditory visual hallucinations. During examination he appears much brighter and more conversational. His thought processes are linear.  11/16/2016. Mr. Forse does not like talking to me today. He is in his room he is in bed He does not open his eyes for me and is unable to keep a conversation he keeps telling me okay okay and is unwilling to answer any questions. He received just one ECT treatment last week. Since Dr. Weber Cooks is on vacation, ECT will resume next week.  He accepts medications and seems to tolerate them well Clozaril level is over 700. This is probably from a combination of clozapine and Luvox. I will alert dose of clozapine to 200 mg  To  avoid side effects. I will switch to Luvox to 1 nightly dose to improve sleep.  11/17/2016. Mr. Pelc is much more animated and interactive today. He is in his room in bed bugs is able to hold a conversation and maintains good eye contact. He complains of stomach ache. He did have a bowel movement this  morning. His mood is still depressed and he needs much encouragement to attend to his ADLs. Staff needs to strongly encourage showering. He has high hopes for ECT and will not stay in the hospital until next week to resume ECT with Dr. Weber Cooks Is back from vacation. Yesterday he is agreeable to the nighttime and clozapine dose decrease due to high clozapine level. This could have helped the patient stated more awake this morning. He slept almost 6 hours.  11/18/2016. Mr. Larmon seems more relaxed and talkative today. He is still very flat on his back but opens his eyes for me to we are having a small conversation. He has not been sleeping well. There are in spite of medication adjustments. He believes that trazodone works well for him. I will start trazodone tonight. She no longer complains of stomach pain. He does not participate in programming but takes medications with no problem. She met with the group home representative this week and was accepted to a group home with a bed open at the beginning of July. When asked returns from vacation, he will decide whether to continue ECT or whether the patient will be discharged.  Per nursing: Patient continues to isolate self to room. Alert and responsive, up for meals and medications. Patient encouraged by staff get up and go into lounge and interact with peers. Affect flat, patient remains safe on unit with q 15 minute safety checks. BS checks monitored and pt medicated per insulin policy.  Principal Problem: Schizoaffective disorder, depressive type ([place]) Diagnosis:   Patient Active Problem List   Diagnosis Date Noted  . Schizoaffective disorder, depressive type ([place]) [F25.1] 09/23/2016  . Polysubstance abuse [F19.10] 07/17/2016  . Tardive dyskinesia [G24.01] 07/16/2016  . Tobacco use disorder [F17.200] 07/07/2016  . Dyslipidemia [E78.5] 07/07/2016  . Asthma [J45.909] 07/07/2016  . HTN (hypertension) [I10] 07/06/2016  . Diabetes ([address]) [E11.9] 12/25/2010    Total Time spent with patient: 30 minutes    Past Medical History:  Past Medical History:  Diagnosis Date  . Anxiety   . Asthma   . Diabetes mellitus   . High blood pressure   . Sinus complaint    History reviewed. No pertinent surgical history.  Social History:  History  Alcohol Use No     History  Drug Use No    Social History   Social History  . Marital status: Single    Spouse name: [name]  . Number of children: [name]  . Years of education: [name]   Social History Main Topics  . Smoking status: Current Every Day Smoker    Packs/day: 0.50    Types: Cigarettes  . Smokeless tobacco: Never Used  . Alcohol use No  . Drug use: No  . Sexual activity: Not Currently   Other Topics Concern  . None   Social History Narrative  . None   Additional Social History:                         Sleep: Good  Appetite:  Good  Current Medications: Current Facility-Administered Medications  Medication Dose Route Frequency Provider Last Rate Last Dose  .  acetaminophen (TYLENOL) tablet 650 mg  650 mg Oral Q6H PRN [name], [name], MD      . alum & mag hydroxide-simeth (MAALOX/MYLANTA) 200-200-20 MG/5ML suspension 30 mL  30 mL Oral Q4H PRN [name], [name], MD      . dextrose 5 %-0.45 % sodium chloride infusion   Intravenous Continuous [name], Madie Reno, MD 10 mL/hr at 11/13/16 1009    . feeding supplement (GLUCERNA SHAKE) (GLUCERNA SHAKE) liquid 237 mL  237 mL Oral TID BM Fritzi Mandes, MD   237 mL at 11/18/16 0956  . fluvoxaMINE (LUVOX) tablet 100 mg  100 mg Oral QHS [name], [name], MD   100 mg at 11/17/16 2121  . insulin aspart (novoLOG) injection 0-15 Units  0-15 Units Subcutaneous TID WC [name], Madie Reno, MD   3 Units at 11/18/16 0805  . insulin aspart (novoLOG) injection 10 Units  10 Units Subcutaneous TID WC Fritzi Mandes, MD   10 Units at 11/18/16 0805  . insulin glargine (LANTUS) injection 55 Units  55 Units Subcutaneous QHS Fritzi Mandes, MD   55 Units at 11/17/16  2121  . lisinopril (PRINIVIL,ZESTRIL) tablet 20 mg  20 mg Oral Daily [name], Madie Reno, MD   20 mg at 11/18/16 0806  . magnesium hydroxide (MILK OF MAGNESIA) suspension 30 mL  30 mL Oral Daily PRN [name], [name], MD      . Melatonin TABS 10 mg  10 mg Oral q1800 Hildred Priest, MD   10 mg at 11/17/16 1705  . meloxicam (MOBIC) tablet 7.5 mg  7.5 mg Oral BID PC [name], [name], MD   7.5 mg at 11/18/16 0809  . metFORMIN (GLUCOPHAGE) tablet 1,000 mg  1,000 mg Oral BID WC [name], Madie Reno, MD   1,000 mg at 11/18/16 0809  . montelukast (SINGULAIR) tablet 10 mg  10 mg Oral QHS [name], Madie Reno, MD   10 mg at 11/17/16 2121  . nicotine (NICODERM CQ - dosed in mg/24 hr) patch 7 mg  7 mg Transdermal Daily Chauncey Mann, MD   7 mg at 11/18/16 0813  . pantoprazole (PROTONIX) EC tablet 40 mg  40 mg Oral Daily [name], [name], MD   40 mg at 11/18/16 0806  . polyethylene glycol (MIRALAX / GLYCOLAX) packet 17 g  17 g Oral Daily [name], Madie Reno, MD   17 g at 11/18/16 0810  . promethazine (PHENERGAN) tablet 12.5 mg  12.5 mg Oral Q6H PRN [name], [name], MD      . propranolol (INDERAL) tablet 20 mg  20 mg Oral BID [name], Madie Reno, MD   20 mg at 11/18/16 0806  . simvastatin (ZOCOR) tablet 40 mg  40 mg Oral q1800 [name], Madie Reno, MD   40 mg at 11/17/16 1705  . traZODone (DESYREL) tablet 200 mg  200 mg Oral QHS [name], [name], MD        Lab Results:  Results for orders placed or performed during the hospital encounter of 11/06/16 (from the past 48 hour(s))  Glucose, capillary     Status: Abnormal   Collection Time: 11/16/16  4:32 PM  Result Value Ref Range   Glucose-Capillary 197 (H) 65 - 99 mg/dL  Glucose, capillary     Status: Abnormal   Collection Time: 11/16/16  7:46 PM  Result Value Ref Range   Glucose-Capillary 199 (H) 65 - 99 mg/dL  Glucose, capillary     Status: Abnormal   Collection Time: 11/17/16  6:44 AM  Result Value Ref Range   Glucose-Capillary 233 (H) 65 - 99 mg/dL   Glucose, capillary     Status: Abnormal   Collection Time: 11/17/16 11:26 AM  Result Value Ref Range   Glucose-Capillary 307 (H) 65 - 99 mg/dL  Glucose, capillary     Status: Abnormal   Collection Time: 11/17/16  4:32 PM  Result Value Ref Range   Glucose-Capillary 195 (H) 65 - 99 mg/dL   Comment 1 Document in Chart   Glucose, capillary     Status: Abnormal   Collection Time: 11/17/16  8:17 PM  Result Value Ref Range   Glucose-Capillary 177 (H) 65 - 99 mg/dL   Comment 1 Notify RN   Glucose, capillary     Status: Abnormal   Collection Time: 11/18/16  6:34 AM  Result Value Ref Range   Glucose-Capillary 194 (H) 65 - 99 mg/dL  CBC with Differential/Platelet     Status: None   Collection Time: 11/18/16  7:12 AM  Result Value Ref Range   WBC 6.7 3.8 - 10.6 K/uL   RBC 4.66 4.40 - 5.90 MIL/uL   Hemoglobin 14.2 13.0 - 18.0 g/dL   HCT 40.1 40.0 - 52.0 %   MCV 86.1 80.0 - 100.0 fL   MCH 30.5 26.0 - 34.0 pg   MCHC 35.5 32.0 - 36.0 g/dL   RDW 12.4 11.5 - 14.5 %   Platelets 181 150 - 440 K/uL   Neutrophils Relative % 48 %   Neutro Abs 3.2 1.4 - 6.5 K/uL   Lymphocytes Relative 38 %   Lymphs Abs 2.5 1.0 - 3.6 K/uL   Monocytes Relative 6 %   Monocytes Absolute 0.4 0.2 - 1.0 K/uL   Eosinophils Relative 7 %   Eosinophils Absolute 0.5 0 - 0.7 K/uL   Basophils Relative 1 %   Basophils Absolute 0.1 0 - 0.1 K/uL  Glucose, capillary     Status: Abnormal   Collection Time: 11/18/16 11:21 AM  Result Value Ref Range   Glucose-Capillary 285 (H) 65 - 99 mg/dL    Blood Alcohol level:  Lab Results  Component Value Date   ETH <5 10/21/2016   ETH <5 39/76/7341    Metabolic Disorder Labs: Lab Results  Component Value Date   HGBA1C 10.3 (H) 11/09/2016   MPG 249 11/09/2016   MPG 275 10/09/2016   Lab Results  Component Value Date   PROLACTIN 24.5 (H) 09/24/2016   PROLACTIN 3.4 (L) 07/07/2016   Lab Results  Component Value Date   CHOL 92 11/09/2016   TRIG 54 11/09/2016   HDL 39 (L)  11/09/2016   CHOLHDL 2.4 11/09/2016   VLDL 11 11/09/2016   LDLCALC 42 11/09/2016   LDLCALC 72 10/09/2016    Physical Findings: AIMS: Facial and Oral Movements Muscles of Facial Expression: Minimal Lips and Perioral Area: Mild Jaw: Minimal Tongue: Minimal,Extremity Movements Upper (arms, wrists, hands, fingers): None, normal Lower (legs, knees, ankles, toes): Minimal, Trunk Movements Neck, shoulders, hips: None, normal, Overall Severity Severity of abnormal movements (highest score from questions above): Minimal Incapacitation due to abnormal movements: Minimal Patient's awareness of abnormal movements (rate only patient's report): No Awareness, Dental Status Current problems with teeth and/or dentures?: No Does patient usually wear dentures?: No   Musculoskeletal: Strength & Muscle Tone: within normal limits Gait & Station: normal Patient leans: [name]  Psychiatric Specialty Exam: Physical Exam  Nursing note and vitals reviewed. Constitutional: He appears well-developed and well-nourished.  HENT:  Head: Normocephalic and atraumatic.  Eyes: Conjunctivae are normal. Pupils are equal, round, and reactive to light.  Neck: Normal range of motion.  Cardiovascular: Regular rhythm and normal heart sounds.   Respiratory: Effort normal.  GI: Soft.  Musculoskeletal: Normal range of motion.  Neurological: He is alert.  Skin: Skin is warm and dry.  Psychiatric: His affect is blunt. His speech is delayed. He is slowed. Thought content is paranoid and delusional. Cognition and memory are normal. He expresses impulsivity.    Review of Systems  Constitutional: Negative.   HENT: Negative.   Eyes: Negative.   Cardiovascular: Negative.   Gastrointestinal: Negative.   Genitourinary: Negative.   Musculoskeletal: Negative.   Skin: Negative.   Neurological: Negative.   Endo/Heme/Allergies: Negative.   Psychiatric/Behavioral: Positive for depression and hallucinations.    Blood pressure  (!) 131/91, pulse 80, temperature 98.8 F (37.1 C), temperature source Oral, resp. rate 18, height '5\' 8"'  (1.727 m), weight 77.1 kg (170 lb), SpO2 100 %.Body mass index is 25.85 kg/m.  General Appearance: Disheveled  Eye Contact:  Poor  Speech:  Slow  Volume:  Decreased  Mood:  Dysphoric  Affect:  Blunt  Thought Process:  Linear  Orientation:  Full (Time, Place, and Person)  Thought Content:  Paranoid Ideation  Suicidal Thoughts:  No  Homicidal Thoughts:  No  Memory:  Immediate;   Fair Recent;   Fair Remote;   Fair  Judgement:  Impaired  Insight:  Lacking  Psychomotor Activity:  Decreased  Concentration:  Concentration: Fair and Attention Span: Fair  Recall:  [employer] of Knowledge:  Fair  Language:  Good  Akathisia:  No  Handed:  Right  AIMS (if indicated):     Assets:  Housing Physical Health  ADL's:  Intact  Cognition:  WNL  Sleep:  Number of Hours: 4.45     Treatment Plan Summary:  Mr. Word 41 year old male with prior diagnosis of schizoaffective disorder who presented to the emergency room with vague suicidal thoughts as well as paranoid thoughts. He does. Be responding to internal stimuli and has endorsed some auditory and visual hallucinations. He will be admitted to inpatient psychiatry for medication management, safety and stabilization.  1. Schizoaffective disorder, depressive type. We started Clozaril and increased to 226m nightly for psychosis. Clozapine level 748. We will lover Clozapine dose to 100. He is also on Luvox 100 mg nightly which interferes with Clozapine metabolism.   2. ECT. Patient had ECT on Friday,11/13/2016 for the first time in this hospitalization. I am optimistic that this treatment may make a real difference. Unfortunately I will be away from the hospital all of next week. I am leaving the patient in the care of Dr. PMamie Nickand will sign out to her. If the patient is still in the hospital after this next week we will plan to continue ECT  treatment picking up again on July 2.   3. Metabolic syndrome monitoring. Lipid panel is normal, hemoglobin A1c 103.  4. EKG. Normal sinus rhythm, QTc 417.  5. Questionable history of polysubstance abuse: The patient denies any history of any polysubstance use but records indicate that there are may have been a history of polysubstance use. He was advised to abstain from alcohol and all illicit drugs as it may worsen anxiety mood symptoms as well as psychosis. The patient is not interested in any substance abuse treatment.  6. Nicotine use disorder. Nicotine patch is available.   7. Diabetes. He is on Metformin, Novolog 10 units with meals and  Lantus 55 units, along with ADA diet and blood sugar monitoring. Medicine input is greatly appreciated.  8. Hypertension: Vital signs are stable. We'll continue lisinopril 20 mg daily.  9. ,Seasonal allergies: Continue Singulair 10 mg nightly  10. Anxiety/restlessness. He is on Propranolol.   11. Insomnia. We started Trazodone.  12. Disposition: The patient will return to a group home. He will follow up with [facility] in [facility]. Consider ACT team.    Orson Slick, MD 11/18/2016, 11:48 AM

## 2016-11-18 NOTE — Progress Notes (Signed)
Patient ID: Tony Cunningham, male   DOB: 1975-08-14, 41 y.o.   MRN: [id number]  will sign off, please follow recommendations by diabetes coordinator  Dr Renae Gloss

## 2016-11-18 NOTE — Plan of Care (Signed)
Problem: Activity: Goal: Sleeping patterns will improve Outcome: Progressing Patient slept for Estimated Hours of 4.45; q15 minutes safety round maintained, no injury or falls during this shift.    

## 2016-11-18 NOTE — Tx Team (Signed)
Interdisciplinary Treatment and Diagnostic Plan Update  11/18/2016 Time of Session: 10:25pm Tony Cunningham MRN: [id number]  Principal Diagnosis: Schizoaffective disorder, depressive type (HCC)  Secondary Diagnoses: Principal Problem:   Schizoaffective disorder, depressive type (HCC) Active Problems:   Diabetes (HCC)   HTN (hypertension)   Tobacco use disorder   Dyslipidemia   Asthma   Tardive dyskinesia   Polysubstance abuse   Current Medications:  Current Facility-Administered Medications  Medication Dose Route Frequency Provider Last Rate Last Dose  . acetaminophen (TYLENOL) tablet 650 mg  650 mg Oral Q6H PRN [name], [name], MD      . alum & mag hydroxide-simeth (MAALOX/MYLANTA) 200-200-20 MG/5ML suspension 30 mL  30 mL Oral Q4H PRN [name], [name], MD      . dextrose 5 %-0.45 % sodium chloride infusion   Intravenous Continuous [name], [name], MD 10 mL/hr at 11/13/16 1009    . feeding supplement (GLUCERNA SHAKE) (GLUCERNA SHAKE) liquid 237 mL  237 mL Oral TID BM Patel, Sona, MD   237 mL at 11/18/16 0956  . fluvoxaMINE (LUVOX) tablet 100 mg  100 mg Oral QHS [name], [name], MD   100 mg at 11/17/16 2121  . insulin aspart (novoLOG) injection 0-15 Units  0-15 Units Subcutaneous TID WC [name], [name], MD   3 Units at 11/18/16 0805  . insulin aspart (novoLOG) injection 10 Units  10 Units Subcutaneous TID WC Patel, Sona, MD   10 Units at 11/18/16 0805  . insulin glargine (LANTUS) injection 55 Units  55 Units Subcutaneous QHS Patel, Sona, MD   55 Units at 11/17/16 2121  . lisinopril (PRINIVIL,ZESTRIL) tablet 20 mg  20 mg Oral Daily [name], [name], MD   20 mg at 11/18/16 0806  . magnesium hydroxide (MILK OF MAGNESIA) suspension 30 mL  30 mL Oral Daily PRN [name], [name], MD      . Melatonin TABS 10 mg  10 mg Oral q1800 Hernandez-Gonzalez, Andrea, MD   10 mg at 11/17/16 1705  . meloxicam (MOBIC) tablet 7.5 mg  7.5 mg Oral BID PC [name], [name], MD   7.5 mg at 11/18/16 0809  .  metFORMIN (GLUCOPHAGE) tablet 1,000 mg  1,000 mg Oral BID WC [name], [name], MD   1,000 mg at 11/18/16 0809  . montelukast (SINGULAIR) tablet 10 mg  10 mg Oral QHS [name], [name], MD   10 mg at 11/17/16 2121  . nicotine (NICODERM CQ - dosed in mg/24 hr) patch 7 mg  7 mg Transdermal Daily Kapur, Aarti K, MD   7 mg at 11/18/16 0813  . pantoprazole (PROTONIX) EC tablet 40 mg  40 mg Oral Daily [name], [name], MD   40 mg at 11/18/16 0806  . polyethylene glycol (MIRALAX / GLYCOLAX) packet 17 g  17 g Oral Daily [name], [name], MD   17 g at 11/18/16 0810  . promethazine (PHENERGAN) tablet 12.5 mg  12.5 mg Oral Q6H PRN [name], [name], MD      . propranolol (INDERAL) tablet 20 mg  20 mg Oral BID [name], [name], MD   20 mg at 11/18/16 0806  . simvastatin (ZOCOR) tablet 40 mg  40 mg Oral q1800 [name], [name], MD   40 mg at 11/17/16 1705   PTA Medications: Prescriptions Prior to Admission  Medication Sig Dispense Refill Last Dose  . cloZAPine (CLOZARIL) 100 MG tablet Take 1 tablet (100 mg total) by mouth daily. Daily at 1700 30 tablet 0 11/12/2016  .   cyclobenzaprine (FLEXERIL) 10 MG tablet Take 1 tablet (10 mg total) by mouth 3 (three) times daily as needed for muscle spasms. 60 tablet 0 11/12/2016  . fluvoxaMINE (LUVOX) 50 MG tablet Take 1 tablet (50 mg total) by mouth daily. Daily at 1700 30 tablet 0 11/12/2016  . insulin aspart (NOVOLOG) 100 UNIT/ML injection Inject 5 Units into the skin 3 (three) times daily with meals. 5 mL 0 11/12/2016  . insulin glargine (LANTUS) 100 UNIT/ML injection Inject 0.42 mLs (42 Units total) into the skin at bedtime. 13 mL 0 11/12/2016  . ipratropium (ATROVENT) 0.06 % nasal spray Place 1 spray into both nostrils daily after supper. 3 mL 0 11/12/2016  . lidocaine (LIDODERM) 5 % Place 1 patch onto the skin daily. Remove & Discard patch within 12 hours or as directed by MD 30 patch 0 11/12/2016  . lisinopril (PRINIVIL,ZESTRIL) 20 MG tablet Take 1 tablet (20 mg total) by  mouth daily. 30 tablet 0 11/12/2016  . meloxicam (MOBIC) 7.5 MG tablet Take 1 tablet (7.5 mg total) by mouth 2 (two) times daily. 60 tablet 0 11/12/2016  . metFORMIN (GLUCOPHAGE) 1000 MG tablet Take 1 tablet (1,000 mg total) by mouth 2 (two) times daily with a meal. 60 tablet 0 11/12/2016  . montelukast (SINGULAIR) 10 MG tablet Take 1 tablet (10 mg total) by mouth at bedtime. For Asthma 30 tablet 0 11/12/2016  . polyethylene glycol (MIRALAX / GLYCOLAX) packet Take 17 g by mouth daily. 30 each 0 11/12/2016  . propranolol (INDERAL) 20 MG tablet Take 1 tablet (20 mg total) by mouth 2 (two) times daily. 60 tablet 0 11/13/2016 at Unknown time  . simvastatin (ZOCOR) 40 MG tablet Take 1 tablet (40 mg total) by mouth at bedtime. For high cholesterol 15 tablet 0 11/12/2016    Patient Stressors:    Patient Strengths:    Treatment Modalities: Medication Management, Group therapy, Case management,  1 to 1 session with clinician, Psychoeducation, Recreational therapy.   Physician Treatment Plan for Primary Diagnosis: Schizoaffective disorder, depressive type ([name]) Long Term Goal(s): Improvement in symptoms so as ready for discharge Improvement in symptoms so as ready for discharge   Short Term Goals: Ability to verbalize feelings will improve Ability to demonstrate self-control will improve Ability to identify and develop effective coping behaviors will improve Ability to identify and develop effective coping behaviors will improve Ability to identify triggers associated with substance abuse/mental health issues will improve  Medication Management: Evaluate patient's response, side effects, and tolerance of medication regimen.  Therapeutic Interventions: 1 to 1 sessions, Unit Group sessions and Medication administration.  Evaluation of Outcomes: Not Met  Physician Treatment Plan for Secondary Diagnosis: Principal Problem:   Schizoaffective disorder, depressive type ([name]) Active Problems:   Diabetes  ([place])   HTN (hypertension)   Tobacco use disorder   Dyslipidemia   Asthma   Tardive dyskinesia   Polysubstance abuse  Long Term Goal(s): Improvement in symptoms so as ready for discharge Improvement in symptoms so as ready for discharge   Short Term Goals: Ability to verbalize feelings will improve Ability to demonstrate self-control will improve Ability to identify and develop effective coping behaviors will improve Ability to identify and develop effective coping behaviors will improve Ability to identify triggers associated with substance abuse/mental health issues will improve     Medication Management: Evaluate patient's response, side effects, and tolerance of medication regimen.  Therapeutic Interventions: 1 to 1 sessions, Unit Group sessions and Medication administration.  Evaluation of Outcomes: Not  Met   RN Treatment Plan for Primary Diagnosis: Schizoaffective disorder, depressive type (HCC) Long Term Goal(s): Knowledge of disease and therapeutic regimen to maintain health will improve  Short Term Goals: Ability to identify and develop effective coping behaviors will improve and Compliance with prescribed medications will improve  Medication Management: RN will administer medications as ordered by provider, will assess and evaluate patient's response and provide education to patient for prescribed medication. RN will report any adverse and/or side effects to prescribing provider.  Therapeutic Interventions: 1 on 1 counseling sessions, Psychoeducation, Medication administration, Evaluate responses to treatment, Monitor vital signs and CBGs as ordered, Perform/monitor CIWA, COWS, AIMS and Fall Risk screenings as ordered, Perform wound care treatments as ordered.  Evaluation of Outcomes: Not Met   LCSW Treatment Plan for Primary Diagnosis: Schizoaffective disorder, depressive type (HCC) Long Term Goal(s): Safe transition to appropriate next level of care at discharge,  Engage patient in therapeutic group addressing interpersonal concerns.  Short Term Goals: Engage patient in aftercare planning with referrals and resources and Increase skills for wellness and recovery  Therapeutic Interventions: Assess for all discharge needs, 1 to 1 time with Social worker, Explore available resources and support systems, Assess for adequacy in community support network, Educate family and significant other(s) on suicide prevention, Complete Psychosocial Assessment, Interpersonal group therapy.  Evaluation of Outcomes: Not Met   Progress in Treatment: Attending groups: No. Participating in groups: No. Taking medication as prescribed: Yes. Toleration medication: Yes. Family/Significant other contact made: No, will contact:  attempt to contact patient's mother again.  Patient understands diagnosis: Yes. Discussing patient identified problems/goals with staff: Yes. Medical problems stabilized or resolved: Yes. Denies suicidal/homicidal ideation: Yes. Issues/concerns per patient self-inventory: No. Other: n/a  New problem(s) identified: None identified at this time.   New Short Term/Long Term Goal(s): None identified at this time. Pt has not been participating in unit programming and mostly isolates/withdraws to room with minimum interaction with peers or staff.   Discharge Plan or Barriers: CSW still assessing appropriate discharge plan. Currently looking for new Family [facility] Home placement for patient, patient is tentatively scheduled to resume ECT treatments on July 2nd.   Reason for Continuation of Hospitalization: Depression Medication stabilization Suicidal ideation Other; describe ECT treatment  Estimated Length of Stay: 7 days.   Attendees: Patient: 11/18/2016 11:09 AM  Physician: Dr. Jolanta [name], MD 11/18/2016 11:09 AM  Nursing: Gwen Farrish, RN 11/18/2016 11:09 AM  RN Care Manager: 11/18/2016 11:09 AM  Social Worker:  G.  MSW, LCSWA  11/18/2016 11:09 AM  Recreational Therapist:  11/18/2016 11:09 AM  Other:  11/18/2016 11:09 AM  Other:  11/18/2016 11:09 AM  Other: 11/18/2016 11:09 AM    Scribe for Treatment Team:  G , LCSWA 11/18/2016 11:13 AM 

## 2016-11-18 NOTE — Progress Notes (Signed)
D: Patient  Continue to sit and stare for long  Periods  Of time  . Staff continue to urge patient to complete shower this shift , with new clothing to put on . Complaint  With  Medication  Given staff continue to review  any information given  . Patient  Unable to process information .Affect cheerful on approach noted to smile  . Isolates to  Room . Patient will lay in bed all shift. Encourage given to join groups  On unit   Appetite good  Voice no concerns around sleep.  Stated concentration is poor . Stated he was not  Depression  Denies suicidal  homicidal ideations  .  No auditory hallucinations  No pain concerns  A: Encourage patient participation with unit programming . Instruction  Given on  Medication , verbalize understanding. R: Voice no other concerns. Staff continue to monitor

## 2016-11-19 LAB — GLUCOSE, CAPILLARY
GLUCOSE-CAPILLARY: 252 mg/dL — AB (ref 65–99)
GLUCOSE-CAPILLARY: 190 mg/dL — AB (ref 65–99)
Glucose-Capillary: 149 mg/dL — ABNORMAL HIGH (ref 65–99)
Glucose-Capillary: 133 mg/dL — ABNORMAL HIGH (ref 65–99)

## 2016-11-19 MED ORDER — TEMAZEPAM 15 MG PO CAPS
15.0000 mg | ORAL_CAPSULE | Freq: Every day | ORAL | Status: DC
  Administered 2016-11-19 – 2016-12-07 (×19): 15 mg via ORAL
  Filled 2016-11-19 (×19): qty 1

## 2016-11-19 NOTE — [facility] Group Notes (Signed)
[facility] LCSW Group Therapy Note  Type of Therapy and Topic:  Group Therapy:  Goals Group: SMART Goals  Participation Level:  Patient did not attend group. CSW invited patient to group.   Description of Group:   The purpose of a daily goals group is to assist and guide patients in setting recovery/wellness-related goals.  The objective is to set goals as they relate to the crisis in which they were admitted. Patients will be using SMART goal modalities to set measurable goals.  Characteristics of realistic goals will be discussed and patients will be assisted in setting and processing how one will reach their goal. Facilitator will also assist patients in applying interventions and coping skills learned in psycho-education groups to the SMART goal and process how one will achieve defined goal.  Therapeutic Goals: -Patients will develop and document one goal related to or their crisis in which brought them into treatment. -Patients will be guided by LCSW using SMART goal setting modality in how to set a measurable, attainable, realistic and time sensitive goal.  -Patients will process barriers in reaching goal. -Patients will process interventions in how to overcome and successful in reaching goal.   Summary of Patient Progress:  Patient Goal: None identified at this time, patient did not attend group on today.   Therapeutic Modalities:   Motivational Interviewing Engineer, manufacturing systems Therapy Crisis Intervention Model SMART goals setting  Mckenzie Bove G. Garnette Czech MSW, [facility] 11/19/2016 10:29 AM

## 2016-11-19 NOTE — Progress Notes (Signed)
[facility] MD Progress Note  11/19/2016 1:40 PM Tony Cunningham  MRN:  [id number]  Subjective:   Tony Cunningham is a 41 year old male with prior diagnosis of schizoaffective disorder who initially came to the emergency room endorsing feelings of paranoia and vague suicidal thoughts as he did not feel safe living in a group home. The patient is endorsing some very vague paranoid thoughts and psychotic symptoms including auditory and visual hallucinations.  11/08/16. The patient has been very isolative and withdrawn. Hygiene is poor and he is malodorous. He has not showered. He did go outside with the other patients and has been getting up for meals but not attending groups or interacting with staff or peers. He denies any current active or passive suicidal thoughts but does admit to some very vague paranoid thoughts that others are watching him. He is very limited and talking with this Probation officer and wanted to be "left alone to sleep". He denied any current somatic complaints. Vital signs are stable and he slept fairly well last night but is also sleeping partially during the daytime. So far, he is tolerating the Clozaril fairly well without any physical adverse side effects.  Follow-up for this 41 year old man with what appears to be psychotic depression or schizoaffective disorder. Patient remains in bed most of the time. Poorly interactive. Mood is depressed. Passive suicidal thoughts.  Follow-up for Thursday the 21st. Patient remains withdrawn very little activity. Blood sugars are also running much higher. Patient continues to endorse feeling depressed and having vague suicidal thoughts.  Follow-up for Friday the 22nd. Patient finally had ECT this morning bilateral treatment which went off without any difficulty or complication. I saw him this afternoon and I think he actually was a little more interactive and verbal and energetic than before. This may be wishful thinking but it makes me optimistic. No specific  complaints other than headache  Patient was seen today for follow-up. Patient has no new complaints. Continues to say his thoughts are racing but his affect is flat and withdrawn he is almost completely withdrawn from other people and stays in his room by himself. Suicidal thoughts passive no intention or plan. Still has psychotic symptoms. Blood sugars unfortunately continued to run quite high.  6/23 patient denies having any issues or concerns today. Says that he has been eating and sleeping well. Denies side effects from medications or any physical complaints. Denies suicidality or homicidality. He says that he might be hallucinating but he is not sure. He said that he had 1 ECT procedure and it went well.   Blood glucose this morning was 92. Patient is compliant with medications. No major events overnight per nursing.  6/24 patient did not sleep well last night. Other than that he denies having any issues or concerns. He denies suicidality, homicidality or auditory visual hallucinations. During examination he appears much brighter and more conversational. His thought processes are linear.  11/16/2016. Tony Cunningham does not like talking to me today. He is in his room he is in bed He does not open his eyes for me and is unable to keep a conversation he keeps telling me okay okay and is unwilling to answer any questions. He received just one ECT treatment last week. Since Dr. Weber Cooks is on vacation, ECT will resume next week.  He accepts medications and seems to tolerate them well Clozaril level is over 700. This is probably from a combination of clozapine and Luvox. I will alert dose of clozapine to 200 mg  To  avoid side effects. I will switch to Luvox to 1 nightly dose to improve sleep.  11/17/2016. Tony Cunningham is much more animated and interactive today. He is in his room in bed bugs is able to hold a conversation and maintains good eye contact. He complains of stomach ache. He did have a bowel movement this  morning. His mood is still depressed and he needs much encouragement to attend to his ADLs. Staff needs to strongly encourage showering. He has high hopes for ECT and will not stay in the hospital until next week to resume ECT with Dr. Weber Cooks Is back from vacation. Yesterday he is agreeable to the nighttime and clozapine dose decrease due to high clozapine level. This could have helped the patient stated more awake this morning. He slept almost 6 hours.  11/18/2016. Tony Cunningham seems more relaxed and talkative today. He is still very flat on his back but opens his eyes for me to we are having a small conversation. He has not been sleeping well. There are in spite of medication adjustments. He believes that trazodone works well for him. I will start trazodone tonight. She no longer complains of stomach pain. He does not participate in programming but takes medications with no problem. She met with the group home representative this week and was accepted to a group home with a bed open at the beginning of July. When asked returns from vacation, he will decide whether to continue ECT or whether the patient will be discharged.  11/19/2016. Tony Cunningham seems better today. This morning he has been up and walking around. He complains of "tension" in his head both mental and physical. He slept 3 hours only again in spite of multiple sedating medications given at night including Trazodone. I will switch him to Restoril tonight. It may need to be discontinued if her restarts ECT next week. No other somatic complaints. Sugers still elevated at times. I changed diet to ADA and discontinued Glucerna. Input from diabetes nurse coordinator and medicine consultant is greatly appreciated. He does not interact with staff/peers or participate in programing. Hygiene is still questionable.  Per nursing: Patient continues to isolate self to room. Alert and responsive, up for meals and medications. Patient encouraged by staff get up and  go into lounge and interact with peers. Affect flat, patient remains safe on unit with q 15 minute safety checks. BS checks monitored and pt medicated per insulin policy.  Principal Problem: Schizoaffective disorder, depressive type ([place]) Diagnosis:   Patient Active Problem List   Diagnosis Date Noted  . Schizoaffective disorder, depressive type ([place]) [F25.1] 09/23/2016  . Polysubstance abuse [F19.10] 07/17/2016  . Tardive dyskinesia [G24.01] 07/16/2016  . Tobacco use disorder [F17.200] 07/07/2016  . Dyslipidemia [E78.5] 07/07/2016  . Asthma [J45.909] 07/07/2016  . HTN (hypertension) [I10] 07/06/2016  . Diabetes ([place]) [E11.9] 12/25/2010   Total Time spent with patient: 30 minutes    Past Medical History:  Past Medical History:  Diagnosis Date  . Anxiety   . Asthma   . Diabetes mellitus   . High blood pressure   . Sinus complaint    History reviewed. No pertinent surgical history.  Social History:  History  Alcohol Use No     History  Drug Use No    Social History   Social History  . Marital status: Single    Spouse name: [name]  . Number of children: [name]  . Years of education: [name]   Social History Main Topics  .  Smoking status: Current Every Day Smoker    Packs/day: 0.50    Types: Cigarettes  . Smokeless tobacco: Never Used  . Alcohol use No  . Drug use: No  . Sexual activity: Not Currently   Other Topics Concern  . None   Social History Narrative  . None   Additional Social History:                         Sleep: Good  Appetite:  Good  Current Medications: Current Facility-Administered Medications  Medication Dose Route Frequency Provider Last Rate Last Dose  . acetaminophen (TYLENOL) tablet 650 mg  650 mg Oral Q6H PRN [name], [name], MD      . alum & mag hydroxide-simeth (MAALOX/MYLANTA) 200-200-20 MG/5ML suspension 30 mL  30 mL Oral Q4H PRN [name], [name], MD      . dextrose 5 %-0.45 % sodium chloride infusion   Intravenous Continuous  [name], Madie Reno, MD 10 mL/hr at 11/13/16 1009    . fluvoxaMINE (LUVOX) tablet 100 mg  100 mg Oral QHS Tayvin Preslar B, MD   100 mg at 11/18/16 2157  . insulin aspart (novoLOG) injection 0-15 Units  0-15 Units Subcutaneous TID WC Loletha Grayer, MD   8 Units at 11/19/16 1201  . insulin aspart (novoLOG) injection 0-5 Units  0-5 Units Subcutaneous QHS Loletha Grayer, MD   4 Units at 11/18/16 2158  . insulin aspart (novoLOG) injection 10 Units  10 Units Subcutaneous TID WC Fritzi Mandes, MD   10 Units at 11/19/16 1159  . insulin glargine (LANTUS) injection 55 Units  55 Units Subcutaneous QHS Fritzi Mandes, MD   55 Units at 11/18/16 2157  . lisinopril (PRINIVIL,ZESTRIL) tablet 20 mg  20 mg Oral Daily [name], Madie Reno, MD   20 mg at 11/19/16 0802  . magnesium hydroxide (MILK OF MAGNESIA) suspension 30 mL  30 mL Oral Daily PRN [name], [name], MD      . Melatonin TABS 10 mg  10 mg Oral q1800 Hildred Priest, MD   10 mg at 11/18/16 1756  . meloxicam (MOBIC) tablet 7.5 mg  7.5 mg Oral BID PC [name], [name], MD   7.5 mg at 11/19/16 0804  . metFORMIN (GLUCOPHAGE) tablet 1,000 mg  1,000 mg Oral BID WC [name], Madie Reno, MD   1,000 mg at 11/19/16 0801  . montelukast (SINGULAIR) tablet 10 mg  10 mg Oral QHS [name], Madie Reno, MD   10 mg at 11/18/16 2159  . nicotine (NICODERM CQ - dosed in mg/24 hr) patch 7 mg  7 mg Transdermal Daily Chauncey Mann, MD   7 mg at 11/18/16 0813  . pantoprazole (PROTONIX) EC tablet 40 mg  40 mg Oral Daily Tanielle Emigh B, MD   40 mg at 11/19/16 0801  . polyethylene glycol (MIRALAX / GLYCOLAX) packet 17 g  17 g Oral Daily [name], [name], MD   17 g at 11/19/16 0830  . promethazine (PHENERGAN) tablet 12.5 mg  12.5 mg Oral Q6H PRN [name], [name], MD      . propranolol (INDERAL) tablet 20 mg  20 mg Oral BID [name], Madie Reno, MD   20 mg at 11/19/16 0801  . simvastatin (ZOCOR) tablet 40 mg  40 mg Oral q1800 [name], Madie Reno, MD   40 mg at 11/18/16 1756  . temazepam  (RESTORIL) capsule 15 mg  15 mg Oral QHS Annalee Meyerhoff B, MD  Lab Results:  Results for orders placed or performed during the hospital encounter of 11/06/16 (from the past 48 hour(s))  Glucose, capillary     Status: Abnormal   Collection Time: 11/17/16  4:32 PM  Result Value Ref Range   Glucose-Capillary 195 (H) 65 - 99 mg/dL   Comment 1 Document in Chart   Glucose, capillary     Status: Abnormal   Collection Time: 11/17/16  8:17 PM  Result Value Ref Range   Glucose-Capillary 177 (H) 65 - 99 mg/dL   Comment 1 Notify RN   Glucose, capillary     Status: Abnormal   Collection Time: 11/18/16  6:34 AM  Result Value Ref Range   Glucose-Capillary 194 (H) 65 - 99 mg/dL  CBC with Differential/Platelet     Status: None   Collection Time: 11/18/16  7:12 AM  Result Value Ref Range   WBC 6.7 3.8 - 10.6 K/uL   RBC 4.66 4.40 - 5.90 MIL/uL   Hemoglobin 14.2 13.0 - 18.0 g/dL   HCT 40.1 40.0 - 52.0 %   MCV 86.1 80.0 - 100.0 fL   MCH 30.5 26.0 - 34.0 pg   MCHC 35.5 32.0 - 36.0 g/dL   RDW 12.4 11.5 - 14.5 %   Platelets 181 150 - 440 K/uL   Neutrophils Relative % 48 %   Neutro Abs 3.2 1.4 - 6.5 K/uL   Lymphocytes Relative 38 %   Lymphs Abs 2.5 1.0 - 3.6 K/uL   Monocytes Relative 6 %   Monocytes Absolute 0.4 0.2 - 1.0 K/uL   Eosinophils Relative 7 %   Eosinophils Absolute 0.5 0 - 0.7 K/uL   Basophils Relative 1 %   Basophils Absolute 0.1 0 - 0.1 K/uL  Glucose, capillary     Status: Abnormal   Collection Time: 11/18/16 11:21 AM  Result Value Ref Range   Glucose-Capillary 285 (H) 65 - 99 mg/dL  Glucose, capillary     Status: Abnormal   Collection Time: 11/18/16  4:28 PM  Result Value Ref Range   Glucose-Capillary 206 (H) 65 - 99 mg/dL  Glucose, capillary     Status: Abnormal   Collection Time: 11/18/16  9:53 PM  Result Value Ref Range   Glucose-Capillary 321 (H) 65 - 99 mg/dL  Glucose, capillary     Status: Abnormal   Collection Time: 11/19/16  6:30 AM  Result Value Ref  Range   Glucose-Capillary 149 (H) 65 - 99 mg/dL  Glucose, capillary     Status: Abnormal   Collection Time: 11/19/16 11:35 AM  Result Value Ref Range   Glucose-Capillary 252 (H) 65 - 99 mg/dL    Blood Alcohol level:  Lab Results  Component Value Date   ETH <5 10/21/2016   ETH <5 35/36/1443    Metabolic Disorder Labs: Lab Results  Component Value Date   HGBA1C 10.3 (H) 11/09/2016   MPG 249 11/09/2016   MPG 275 10/09/2016   Lab Results  Component Value Date   PROLACTIN 24.5 (H) 09/24/2016   PROLACTIN 3.4 (L) 07/07/2016   Lab Results  Component Value Date   CHOL 92 11/09/2016   TRIG 54 11/09/2016   HDL 39 (L) 11/09/2016   CHOLHDL 2.4 11/09/2016   VLDL 11 11/09/2016   LDLCALC 42 11/09/2016   LDLCALC 72 10/09/2016    Physical Findings: AIMS: Facial and Oral Movements Muscles of Facial Expression: Minimal Lips and Perioral Area: Mild Jaw: Minimal Tongue: Minimal,Extremity Movements Upper (arms, wrists, hands, fingers): None, normal  Lower (legs, knees, ankles, toes): Minimal, Trunk Movements Neck, shoulders, hips: None, normal, Overall Severity Severity of abnormal movements (highest score from questions above): Minimal Incapacitation due to abnormal movements: Minimal Patient's awareness of abnormal movements (rate only patient's report): No Awareness, Dental Status Current problems with teeth and/or dentures?: No Does patient usually wear dentures?: No   Musculoskeletal: Strength & Muscle Tone: within normal limits Gait & Station: normal Patient leans: [name]  Psychiatric Specialty Exam: Physical Exam  Nursing note and vitals reviewed. Constitutional: He appears well-developed and well-nourished.  HENT:  Head: Normocephalic and atraumatic.  Eyes: Conjunctivae are normal. Pupils are equal, round, and reactive to light.  Neck: Normal range of motion.  Cardiovascular: Regular rhythm and normal heart sounds.   Respiratory: Effort normal.  GI: Soft.   Musculoskeletal: Normal range of motion.  Neurological: He is alert.  Skin: Skin is warm and dry.  Psychiatric: His affect is blunt. His speech is delayed. He is slowed. Thought content is paranoid and delusional. Cognition and memory are normal. He expresses impulsivity.    Review of Systems  Constitutional: Negative.   HENT: Negative.   Eyes: Negative.   Cardiovascular: Negative.   Gastrointestinal: Negative.   Genitourinary: Negative.   Musculoskeletal: Negative.   Skin: Negative.   Neurological: Negative.   Endo/Heme/Allergies: Negative.   Psychiatric/Behavioral: Positive for depression and hallucinations.    Blood pressure 110/80, pulse 77, temperature 98.9 F (37.2 C), temperature source Oral, resp. rate 18, height '5\' 8"'  (1.727 m), weight 77.1 kg (170 lb), SpO2 100 %.Body mass index is 25.85 kg/m.  General Appearance: Disheveled  Eye Contact:  Poor  Speech:  Slow  Volume:  Decreased  Mood:  Dysphoric  Affect:  Blunt  Thought Process:  Linear  Orientation:  Full (Time, Place, and Person)  Thought Content:  Paranoid Ideation  Suicidal Thoughts:  No  Homicidal Thoughts:  No  Memory:  Immediate;   Fair Recent;   Fair Remote;   Fair  Judgement:  Impaired  Insight:  Lacking  Psychomotor Activity:  Decreased  Concentration:  Concentration: Fair and Attention Span: Fair  Recall:  [employer] of Knowledge:  Fair  Language:  Good  Akathisia:  No  Handed:  Right  AIMS (if indicated):     Assets:  Housing Physical Health  ADL's:  Intact  Cognition:  WNL  Sleep:  Number of Hours: 3.45     Treatment Plan Summary:  Tony Cunningham 41 year old male with prior diagnosis of schizoaffective disorder who presented to the emergency room with vague suicidal thoughts as well as paranoid thoughts. He does. Be responding to internal stimuli and has endorsed some auditory and visual hallucinations. He will be admitted to inpatient psychiatry for medication management, safety and  stabilization.  1. Schizoaffective disorder, depressive type. We started Clozaril and increased to 261m nightly for psychosis. Clozapine level 748. We will lover Clozapine dose to 100. He is also on Luvox 100 mg nightly which interferes with Clozapine metabolism. I will order Clozapine level for Friday morning.  2. ECT. Patient had ECT on Friday,11/13/2016 for the first time in this hospitalization. I am optimistic that this treatment may make a real difference. Unfortunately I will be away from the hospital all of next week. I am leaving the patient in the care of Dr. PMamie Nickand will sign out to her. If the patient is still in the hospital after this next week we will plan to continue ECT treatment picking up again on July 2.  3. Metabolic syndrome monitoring. Lipid panel is normal, hemoglobin A1c 103.  4. EKG. Normal sinus rhythm, QTc 417.  5. Questionable history of polysubstance abuse: The patient denies any history of any polysubstance use but records indicate that there are may have been a history of polysubstance use. He was advised to abstain from alcohol and all illicit drugs as it may worsen anxiety mood symptoms as well as psychosis. The patient is not interested in any substance abuse treatment.  6. Nicotine use disorder. Nicotine patch is available.   7. Diabetes. He is on Metformin, Novolog 10 units with meals and Lantus 55 units, along with ADA diet and blood sugar monitoring. Medicine input is greatly appreciated. Glucerna is discontinued.  8. Hypertension: Vital signs are stable. We'll continue lisinopril 20 mg daily.  9. ,Seasonal allergies: Continue Singulair 10 mg nightly  10. Anxiety/restlessness. He is on Propranolol.   11. Insomnia. We discontinued Trazodone and started Restoril.  12. Disposition: The patient will return to a group home. He will follow up with [facility] in [facility]. Consider ACT team.    Orson Slick, MD 11/19/2016, 1:40 PM

## 2016-11-19 NOTE — Plan of Care (Deleted)
Problem: Coping: Goal: Participation in decision-making will improve Outcome: Progressing Verbalized concerns regarding scheduled meds and discussed this with MD

## 2016-11-19 NOTE — Plan of Care (Signed)
Problem: Health Behavior/Discharge Planning: Goal: Compliance with treatment plan for underlying cause of condition will improve Outcome: Not Progressing Not involved in unit activities

## 2016-11-19 NOTE — Progress Notes (Signed)
Pt calm, cooperative and pleasant.  AM BS 149.  Speech is more clear and logical, though remains hypoverbal.  Complains of muscle pain in neck starting approx. 1 week ago.  PRN acetaminophen offered and eventually accepted.  Reports moderate relief.  Remains frequently isolative to room.  No behavioral issues.

## 2016-11-19 NOTE — Plan of Care (Signed)
Problem: Coping: Goal: Ability to interact with others will improve Outcome: Not Progressing Isolative in room, guarded

## 2016-11-19 NOTE — Progress Notes (Signed)
Inpatient Diabetes Program Recommendations  AACE/ADA: New Consensus Statement on Inpatient Glycemic Control (2015)  Target Ranges:  Prepandial:   less than 140 mg/dL      Peak postprandial:   less than 180 mg/dL (1-2 hours)      Critically ill patients:  140 - 180 mg/dL   Lab Results  Component Value Date   GLUCAP 149 (H) 11/19/2016   HGBA1C 10.3 (H) 11/09/2016    Review of Glycemic Control  Results for KRUZE, ATCHLEY (MRN [id number]) as of 11/19/2016 09:03  Ref. Range 11/18/2016 06:34 11/18/2016 11:21 11/18/2016 16:28 11/18/2016 21:53 11/19/2016 06:30  Glucose-Capillary Latest Ref Range: 65 - 99 mg/dL 856 (H) 314 (H) 970 (H) 321 (H) 149 (H)    Current orders for Inpatient glycemic control: Lantus 55 units QHS, Novolog 0-15 units TID with meals, Novolog 10 units TID with meals, Metformin 1000 mg BID, Novolog 0-5 units qhs  Inpatient Diabetes Program Recommendations:   If appropriate, please consider changing diet from REGULAR to CARB MODIFIED and re-evaluate need for Glucerna supplements since patient is eating 100% (per chart documentation).  Susette Racer, RN, BA, MHA, CDE Diabetes Coordinator Inpatient Diabetes Program  [phone] (Team Pager) [phone] [facility] Office) 11/19/2016 9:04 AM

## 2016-11-19 NOTE — [facility] Group Notes (Signed)
[facility] LCSW Group Therapy  11/19/2016 1:52 PM  Type of Therapy:  Group Therapy  Participation Level:  Patient did not attend group. CSW invited patient to group.   Summary of Progress/Problems: Balance in life: Patients will discuss the concept of balance and how it looks and feels to be unbalanced. Pt will identify areas in their life that is unbalanced and ways to become more balanced. They discussed what aspects in their lives has influenced their self care. Patients also discussed self care in the areas of self regulation/control, hygiene/appearance, sleep/relaxation, healthy leisure, healthy eating habits, exercise, inner peace/spirituality, self improvement, sobriety, and health management. They were challenged to identify changes that are needed in order to improve self care.  Avan Gullett G. Garnette Czech MSW, LCSWA 11/19/2016, 1:52 PM

## 2016-11-19 NOTE — Progress Notes (Signed)
Recreation Therapy Notes  Date: 06.28.18 Time: 9:30 am Location: Craft Room  Group Topic: Leisure Education  Goal Area(s) Addresses:  Patient will identify things they are grateful for. Patient will identify how being grateful can influence decision making.  Behavioral Response: Attentive, Interactive  Intervention: Grateful Wheel  Activity: Patients were given an I Am Grateful For worksheet and were instructed to write things they are grateful for under each category.  Education: LRT educated patients on leisure.   Education Outcome: Acknowledges education/In group clarification offered  Clinical Observations/Feedback: Patient colored worksheet. Patient contributed to group discussion by stating what he is grateful for.  Jacquelynn Cree, LRT/CTRS 11/19/2016 10:25 AM

## 2016-11-19 NOTE — Progress Notes (Signed)
Patient was isolative in room. Came to the medication room for medications and went to the dayroom for snack. Not interacting with staff not peers. Disheveled with poor hygiene. Patient denied SI/HI/hallucinations. Not willing to talk much. Returned to bed after medications and snack and currently sleeping. Safety precautions maintained.

## 2016-11-19 NOTE — Plan of Care (Signed)
Problem: Coping: Goal: Ability to interact with others will improve Outcome: Progressing More interaction with peers and staff observed

## 2016-11-20 LAB — GLUCOSE, CAPILLARY
GLUCOSE-CAPILLARY: 103 mg/dL — AB (ref 65–99)
GLUCOSE-CAPILLARY: 56 mg/dL — AB (ref 65–99)
GLUCOSE-CAPILLARY: 186 mg/dL — AB (ref 65–99)
Glucose-Capillary: 238 mg/dL — ABNORMAL HIGH (ref 65–99)
Glucose-Capillary: 188 mg/dL — ABNORMAL HIGH (ref 65–99)

## 2016-11-20 MED ORDER — CLOZAPINE 100 MG PO TABS
100.0000 mg | ORAL_TABLET | Freq: Every day | ORAL | Status: DC
  Administered 2016-11-20 – 2016-12-07 (×18): 100 mg via ORAL
  Filled 2016-11-20 (×18): qty 1

## 2016-11-20 NOTE — Progress Notes (Signed)
Recreation Therapy Notes  Date: 06.29.18 Time: 9:30 am Location: Craft Room  Group Topic: Coping Skills  Goal Area(s) Addresses:  Patient will verbalize one emotion experienced in group. Patient will verbalize benefit of using art as a coping skill.  Behavioral Response: Did not attend  Intervention: Coloring  Activity: Patients were given coloring sheets to color while listening to music. Patients were instructed to think about the emotions they were feeling and what their minds were focused on.  Education: LRT educated patients on healthy coping skills.  Education Outcome: Patient did not attend group.  Clinical Observations/Feedback: Patient did not attend group.  Jacquelynn Cree, LRT/CTRS 11/20/2016 10:17 AM

## 2016-11-20 NOTE — Progress Notes (Signed)
Hypoglycemic Event  CBG: 56 at 1138  Treatment: 15 GM carbohydrate snack, pt ate lunch  Symptoms: Sweaty, Shaky, Hungry and Nervous/irritable  Follow-up CBG: Time:1200 CBG Result:103  Possible Reasons for Event: unknown, medications?  Comments/MD notified:Dr. [name] informed.    Tony Cunningham

## 2016-11-20 NOTE — Progress Notes (Signed)
Patient ID: Tony Cunningham, male   DOB: Feb 26, 1976, 41 y.o.   MRN: [id number]  Case Management Update 11/18/2016  Patient's former group home manager, Tami Lin [phone]) from [facility] contacted CSW to inquire if patient was still in the hospital. Informed CSW that she would like to have patient back as a resident in her home as she potentially might have a bed coming available in the [facility]. Informed CSW that is working on moving another resident to another group home in the next few days. CSW will follow-up with her on Friday about her bed availability.   Jossiah Smoak G. Garnette Czech MSW, [facility] 11/20/2016 2:58 PM

## 2016-11-20 NOTE — Progress Notes (Signed)
Calm and cooperative. Complains of neck pain which has been constant for multiple admissions now, medications administered as ordered with no change. Appears to be in no acute distress. Much encouragement provided for pt to shower, supplies provided, and encouragement provided to utilize warm shower to help with neck pain, pt refuses. Pt did report he took a shower, but there is no evidence present that he took a shower. Pt has body odor and poor hygiene. Isolative to room most of the day, only comes out of room for meals, meds and when other pt's are loud and disruptive to milieu. Denies SI/HI/AVH. States, "Am I going to be ok?" Support and encouragement provided. Medications administered as ordered with education. Safety maintained with every 15 minute checks. Will continue to monitor.

## 2016-11-20 NOTE — Plan of Care (Signed)
Problem: Coping: Goal: Ability to use eye contact when communicating with others will improve Outcome: Progressing Pt is able to use eye contact, eye contact is intense.

## 2016-11-20 NOTE — Plan of Care (Signed)
Problem: Education: Goal: Mental status will improve Outcome: Progressing Pt more alert, talkative today. Logical and coherent with conversation.

## 2016-11-20 NOTE — [facility] Group Notes (Signed)
[facility] Group Notes:  (Nursing/MHT/Case Management/Adjunct)  Date:  11/20/2016  Time:  4:28 AM  Type of Therapy:  Psychoeducational Skills  Participation Level:  Did Not Attend  Summary of Progress/Problems:  Chancy Milroy 11/20/2016, 4:28 AM

## 2016-11-20 NOTE — Progress Notes (Addendum)
[facility] MD Progress Note  11/20/2016 12:23 PM Aison Malveaux  MRN:  [id number]  Subjective:  Mr. Farrel is a 41 year old male with prior diagnosis of schizoaffective disorder. He received ECT to be continued next week if appropriate.  He was started on Clozapine and his dose was titrated to 225 mg. He is also on Luvox. Clozapine level was quite high with resulting sedation, because of the combination. Unfortunately in ERROR his Clozapine was discotoniued after Monday. I restarted 100 mg of Clozapine tonight. Actually, he is doing well without it, less sedated. Not knowing he was off Clozapine I took another level Thursday night.  11/08/16. The patient has been very isolative and withdrawn. Hygiene is poor and he is malodorous. He has not showered. He did go outside with the other patients and has been getting up for meals but not attending groups or interacting with staff or peers. He denies any current active or passive suicidal thoughts but does admit to some very vague paranoid thoughts that others are watching him. He is very limited and talking with this Probation officer and wanted to be "left alone to sleep". He denied any current somatic complaints. Vital signs are stable and he slept fairly well last night but is also sleeping partially during the daytime. So far, he is tolerating the Clozaril fairly well without any physical adverse side effects.  Follow-up for this 41 year old man with what appears to be psychotic depression or schizoaffective disorder. Patient remains in bed most of the time. Poorly interactive. Mood is depressed. Passive suicidal thoughts.  Follow-up for Thursday the 21st. Patient remains withdrawn very little activity. Blood sugars are also running much higher. Patient continues to endorse feeling depressed and having vague suicidal thoughts.  Follow-up for Friday the 22nd. Patient finally had ECT this morning bilateral treatment which went off without any difficulty or complication. I saw him  this afternoon and I think he actually was a little more interactive and verbal and energetic than before. This may be wishful thinking but it makes me optimistic. No specific complaints other than headache  Patient was seen today for follow-up. Patient has no new complaints. Continues to say his thoughts are racing but his affect is flat and withdrawn he is almost completely withdrawn from other people and stays in his room by himself. Suicidal thoughts passive no intention or plan. Still has psychotic symptoms. Blood sugars unfortunately continued to run quite high.  6/23 patient denies having any issues or concerns today. Says that he has been eating and sleeping well. Denies side effects from medications or any physical complaints. Denies suicidality or homicidality. He says that he might be hallucinating but he is not sure. He said that he had 1 ECT procedure and it went well.   Blood glucose this morning was 92. Patient is compliant with medications. No major events overnight per nursing.  6/24 patient did not sleep well last night. Other than that he denies having any issues or concerns. He denies suicidality, homicidality or auditory visual hallucinations. During examination he appears much brighter and more conversational. His thought processes are linear.  11/16/2016. Mr. Dilauro does not like talking to me today. He is in his room he is in bed He does not open his eyes for me and is unable to keep a conversation he keeps telling me okay okay and is unwilling to answer any questions. He received just one ECT treatment last week. Since Dr. Weber Cooks is on vacation, ECT will resume next week.  He  accepts medications and seems to tolerate them well Clozaril level is over 700. This is probably from a combination of clozapine and Luvox. I will alert dose of clozapine to 200 mg  To avoid side effects. I will switch to Luvox to 1 nightly dose to improve sleep.  11/17/2016. Mr. Loya is much more animated and  interactive today. He is in his room in bed bugs is able to hold a conversation and maintains good eye contact. He complains of stomach ache. He did have a bowel movement this morning. His mood is still depressed and he needs much encouragement to attend to his ADLs. Staff needs to strongly encourage showering. He has high hopes for ECT and will not stay in the hospital until next week to resume ECT with Dr. Weber Cooks Is back from vacation. Yesterday he is agreeable to the nighttime and clozapine dose decrease due to high clozapine level. This could have helped the patient stated more awake this morning. He slept almost 6 hours.  11/18/2016. Mr. Pomplun seems more relaxed and talkative today. He is still very flat on his back but opens his eyes for me to we are having a small conversation. He has not been sleeping well. There are in spite of medication adjustments. He believes that trazodone works well for him. I will start trazodone tonight. She no longer complains of stomach pain. He does not participate in programming but takes medications with no problem. She met with the group home representative this week and was accepted to a group home with a bed open at the beginning of July. When asked returns from vacation, he will decide whether to continue ECT or whether the patient will be discharged.  11/19/2016. Mr. Jansson seems better today. This morning he has been up and walking around. He complains of "tension" in his head both mental and physical. He slept 3 hours only again in spite of multiple sedating medications given at night including Trazodone. I will switch him to Restoril tonight. It may need to be discontinued if her restarts ECT next week. No other somatic complaints. Sugers still elevated at times. I changed diet to ADA and discontinued Glucerna. Input from diabetes nurse coordinator and medicine consultant is greatly appreciated. He does not interact with staff/peers or participate in programing. Hygiene  is still questionable.  11/20/2016. Mr. Harkless is up and about today. He comes to my office for interview. He seems more relaxed today. Still very vague in his answers. Complains of back and neck pain but has been in bed on his back most of the week. His sugar was low today as he was switched to ADA diet and Glucerna was discontinued upon [facility] suggestion. He is looking forward to see Dr. Weber Cooks "who is the best". He corrects himself to tell me that I am not bad either.  Per nursing: Pt calm, cooperative and pleasant.  AM BS 149.  Speech is more clear and logical, though remains hypoverbal.  Complains of muscle pain in neck starting approx. 1 week ago.  PRN acetaminophen offered and eventually accepted.  Reports moderate relief.  Remains frequently isolative to room.  No behavioral issues.    Principal Problem: Schizoaffective disorder, depressive type ([place]) Diagnosis:   Patient Active Problem List   Diagnosis Date Noted  . Schizoaffective disorder, depressive type ([place]) [F25.1] 09/23/2016  . Polysubstance abuse [F19.10] 07/17/2016  . Tardive dyskinesia [G24.01] 07/16/2016  . Tobacco use disorder [F17.200] 07/07/2016  . Dyslipidemia [E78.5] 07/07/2016  . Asthma [J45.909]  07/07/2016  . HTN (hypertension) [I10] 07/06/2016  . Diabetes ([place]) [E11.9] 12/25/2010   Total Time spent with patient: 30 minutes    Past Medical History:  Past Medical History:  Diagnosis Date  . Anxiety   . Asthma   . Diabetes mellitus   . High blood pressure   . Sinus complaint    History reviewed. No pertinent surgical history.  Social History:  History  Alcohol Use No     History  Drug Use No    Social History   Social History  . Marital status: Single    Spouse name: [name]  . Number of children: [name]  . Years of education: [name]   Social History Main Topics  . Smoking status: Current Every Day Smoker    Packs/day: 0.50    Types: Cigarettes  . Smokeless tobacco: Never Used  . Alcohol use No  . Drug  use: No  . Sexual activity: Not Currently   Other Topics Concern  . None   Social History Narrative  . None   Additional Social History:                         Sleep: Good  Appetite:  Good  Current Medications: Current Facility-Administered Medications  Medication Dose Route Frequency Provider Last Rate Last Dose  . acetaminophen (TYLENOL) tablet 650 mg  650 mg Oral Q6H PRN [name], Madie Reno, MD   650 mg at 11/19/16 1739  . alum & mag hydroxide-simeth (MAALOX/MYLANTA) 200-200-20 MG/5ML suspension 30 mL  30 mL Oral Q4H PRN [name], [name], MD      . cloZAPine (CLOZARIL) tablet 100 mg  100 mg Oral QHS Michi Herrmann B, MD      . dextrose 5 %-0.45 % sodium chloride infusion   Intravenous Continuous [name], Madie Reno, MD 10 mL/hr at 11/13/16 1009    . fluvoxaMINE (LUVOX) tablet 100 mg  100 mg Oral QHS Shannen Flansburg B, MD   100 mg at 11/19/16 2047  . insulin aspart (novoLOG) injection 0-15 Units  0-15 Units Subcutaneous TID WC Loletha Grayer, MD   8 Units at 11/20/16 [phone]  . insulin aspart (novoLOG) injection 0-5 Units  0-5 Units Subcutaneous QHS Loletha Grayer, MD   4 Units at 11/18/16 2158  . insulin aspart (novoLOG) injection 10 Units  10 Units Subcutaneous TID WC Fritzi Mandes, MD   10 Units at 11/20/16 0818  . insulin glargine (LANTUS) injection 55 Units  55 Units Subcutaneous QHS Fritzi Mandes, MD   55 Units at 11/19/16 2047  . lisinopril (PRINIVIL,ZESTRIL) tablet 20 mg  20 mg Oral Daily [name], Madie Reno, MD   20 mg at 11/20/16 0813  . magnesium hydroxide (MILK OF MAGNESIA) suspension 30 mL  30 mL Oral Daily PRN [name], [name], MD      . Melatonin TABS 10 mg  10 mg Oral q1800 Hildred Priest, MD   10 mg at 11/19/16 1740  . meloxicam (MOBIC) tablet 7.5 mg  7.5 mg Oral BID PC [name], [name], MD   7.5 mg at 11/20/16 0820  . metFORMIN (GLUCOPHAGE) tablet 1,000 mg  1,000 mg Oral BID WC [name], Madie Reno, MD   1,000 mg at 11/20/16 0813  . montelukast  (SINGULAIR) tablet 10 mg  10 mg Oral QHS [name], [name], MD   10 mg at 11/19/16 2047  . nicotine (NICODERM CQ - dosed in mg/24 hr) patch 7 mg  7 mg Transdermal Daily  Chauncey Mann, MD   7 mg at 11/20/16 0813  . pantoprazole (PROTONIX) EC tablet 40 mg  40 mg Oral Daily Terina Mcelhinny B, MD   40 mg at 11/20/16 0813  . polyethylene glycol (MIRALAX / GLYCOLAX) packet 17 g  17 g Oral Daily [name], Madie Reno, MD   17 g at 11/20/16 0813  . promethazine (PHENERGAN) tablet 12.5 mg  12.5 mg Oral Q6H PRN [name], [name], MD      . propranolol (INDERAL) tablet 20 mg  20 mg Oral BID [name], Madie Reno, MD   20 mg at 11/20/16 0813  . simvastatin (ZOCOR) tablet 40 mg  40 mg Oral q1800 [name], Madie Reno, MD   40 mg at 11/19/16 1739  . temazepam (RESTORIL) capsule 15 mg  15 mg Oral QHS Klayton Monie B, MD   15 mg at 11/19/16 2051    Lab Results:  Results for orders placed or performed during the hospital encounter of 11/06/16 (from the past 48 hour(s))  Glucose, capillary     Status: Abnormal   Collection Time: 11/18/16  4:28 PM  Result Value Ref Range   Glucose-Capillary 206 (H) 65 - 99 mg/dL  Glucose, capillary     Status: Abnormal   Collection Time: 11/18/16  9:53 PM  Result Value Ref Range   Glucose-Capillary 321 (H) 65 - 99 mg/dL  Glucose, capillary     Status: Abnormal   Collection Time: 11/19/16  6:30 AM  Result Value Ref Range   Glucose-Capillary 149 (H) 65 - 99 mg/dL  Glucose, capillary     Status: Abnormal   Collection Time: 11/19/16 11:35 AM  Result Value Ref Range   Glucose-Capillary 252 (H) 65 - 99 mg/dL  Glucose, capillary     Status: Abnormal   Collection Time: 11/19/16  4:39 PM  Result Value Ref Range   Glucose-Capillary 190 (H) 65 - 99 mg/dL  Glucose, capillary     Status: Abnormal   Collection Time: 11/19/16  8:42 PM  Result Value Ref Range   Glucose-Capillary 133 (H) 65 - 99 mg/dL  Glucose, capillary     Status: Abnormal   Collection Time: 11/20/16  6:47 AM  Result Value  Ref Range   Glucose-Capillary 238 (H) 65 - 99 mg/dL  Glucose, capillary     Status: Abnormal   Collection Time: 11/20/16 11:38 AM  Result Value Ref Range   Glucose-Capillary 56 (L) 65 - 99 mg/dL  Glucose, capillary     Status: Abnormal   Collection Time: 11/20/16 12:00 PM  Result Value Ref Range   Glucose-Capillary 103 (H) 65 - 99 mg/dL    Blood Alcohol level:  Lab Results  Component Value Date   ETH <5 10/21/2016   ETH <5 07/68/0881    Metabolic Disorder Labs: Lab Results  Component Value Date   HGBA1C 10.3 (H) 11/09/2016   MPG 249 11/09/2016   MPG 275 10/09/2016   Lab Results  Component Value Date   PROLACTIN 24.5 (H) 09/24/2016   PROLACTIN 3.4 (L) 07/07/2016   Lab Results  Component Value Date   CHOL 92 11/09/2016   TRIG 54 11/09/2016   HDL 39 (L) 11/09/2016   CHOLHDL 2.4 11/09/2016   VLDL 11 11/09/2016   LDLCALC 42 11/09/2016   LDLCALC 72 10/09/2016    Physical Findings: AIMS: Facial and Oral Movements Muscles of Facial Expression: Minimal Lips and Perioral Area: Mild Jaw: Minimal Tongue: Minimal,Extremity Movements Upper (arms, wrists, hands, fingers): None, normal Lower (legs,  knees, ankles, toes): Minimal, Trunk Movements Neck, shoulders, hips: None, normal, Overall Severity Severity of abnormal movements (highest score from questions above): Minimal Incapacitation due to abnormal movements: Minimal Patient's awareness of abnormal movements (rate only patient's report): No Awareness, Dental Status Current problems with teeth and/or dentures?: No Does patient usually wear dentures?: No   Musculoskeletal: Strength & Muscle Tone: within normal limits Gait & Station: normal Patient leans: [name]  Psychiatric Specialty Exam: Physical Exam  Nursing note and vitals reviewed. Constitutional: He appears well-developed and well-nourished.  HENT:  Head: Normocephalic and atraumatic.  Eyes: Conjunctivae are normal. Pupils are equal, round, and reactive to  light.  Neck: Normal range of motion.  Cardiovascular: Regular rhythm and normal heart sounds.   Respiratory: Effort normal.  GI: Soft.  Musculoskeletal: Normal range of motion.  Neurological: He is alert.  Skin: Skin is warm and dry.  Psychiatric: His affect is blunt. His speech is delayed. He is slowed. Thought content is paranoid and delusional. Cognition and memory are normal. He expresses impulsivity.    Review of Systems  Constitutional: Negative.   HENT: Negative.   Eyes: Negative.   Cardiovascular: Negative.   Gastrointestinal: Negative.   Genitourinary: Negative.   Musculoskeletal: Negative.   Skin: Negative.   Neurological: Negative.   Endo/Heme/Allergies: Negative.   Psychiatric/Behavioral: Positive for depression and hallucinations.    Blood pressure 112/78, pulse 86, temperature 98 F (36.7 C), temperature source Oral, resp. rate 18, height _0  (1.727 m), weight 77.1 kg (170 lb), SpO2 100 %.Body mass index is 25.85 kg/m.  General Appearance: Disheveled  Eye Contact:  Poor  Speech:  Slow  Volume:  Decreased  Mood:  Dysphoric  Affect:  Blunt  Thought Process:  Linear  Orientation:  Full (Time, Place, and Person)  Thought Content:  Paranoid Ideation  Suicidal Thoughts:  No  Homicidal Thoughts:  No  Memory:  Immediate;   Fair Recent;   Fair Remote;   Fair  Judgement:  Impaired  Insight:  Lacking  Psychomotor Activity:  Decreased  Concentration:  Concentration: Fair and Attention Span: Fair  Recall:  [employer] of Knowledge:  Fair  Language:  Good  Akathisia:  No  Handed:  Right  AIMS (if indicated):     Assets:  Housing Physical Health  ADL's:  Intact  Cognition:  WNL  Sleep:  Number of Hours: 7.5     Treatment Plan Summary:  Mr. Logan 41 year old male with prior diagnosis of schizoaffective disorder who presented to the emergency room with vague suicidal thoughts as well as paranoid thoughts. He does. Be responding to internal stimuli and has  endorsed some auditory and visual hallucinations. He will be admitted to inpatient psychiatry for medication management, safety and stabilization.  1. Schizoaffective disorder, depressive type. He was started on Clozaril and his dose was increased to 225 mg nightly for psychosis. Clozapine level 748 most likely due to interaction with the Luvox. I tried to lower Clozapine dose but in ERROR we discontinue Clozapine altogether. Last dose was given on Monday. I ordered Clozapine level on Friday unnecessarily. I restarted 100 mg of Clozapine tonight.   2. ECT. Patient had ECT on Friday,11/13/2016 for the first time in this hospitalization. I am optimistic that this treatment may make a real difference. Unfortunately I will be away from the hospital all of next week. I am leaving the patient in the care of Dr. Mamie Nick and will sign out to her. If the patient is still in the  hospital after this next week we will plan to continue ECT treatment picking up again on July 2.   3. Metabolic syndrome monitoring. Lipid panel is normal, hemoglobin A1c 103.  4. EKG. Normal sinus rhythm, QTc 417.  5. Questionable history of polysubstance abuse: The patient denies any history of any polysubstance use but records indicate that there are may have been a history of polysubstance use. He was advised to abstain from alcohol and all illicit drugs as it may worsen anxiety mood symptoms as well as psychosis. The patient is not interested in any substance abuse treatment.  6. Nicotine use disorder. Nicotine patch is available.   7. Diabetes. He is on Metformin, Novolog 10 units with meals and Lantus 55 units, along with ADA diet and blood sugar monitoring. Medicine input is greatly appreciated. Glucerna is discontinued.  8. Hypertension: Vital signs are stable. We'll continue lisinopril 20 mg daily.  9. Seasonal allergies: Continue Singulair 10 mg nightly  10. Anxiety/restlessness. He is on Propranolol.   11. Insomnia.  We discontinued Trazodone and started Restoril. Slept 7.5 hours.  12. Back pain. We will give Robaxin.  13. Disposition: The patient will return to a group home. He will follow up with [facility] in [facility]. Consider ACT team.    Orson Slick, MD 11/20/2016, 12:23 PM

## 2016-11-20 NOTE — Progress Notes (Signed)
Patient ID: Tony Cunningham, male   DOB: 1976/01/07, 41 y.o.   MRN: [id number]  Case Management Update 11/20/2016  CSW contacted Tami Lin ([phone]) from [facility] family care home to inquire about her bed availability. Informed CSW she still does not have a bed available. Asked CSW to called her on Sunday for a definite answer about her bed availability. No further questions for CSW at this time.   CSW also attempted to call Lupita Leash [phone], group home manager from [facility] to inquire if patient has been accepted to their [facility]. No answer, CSW left HIPAA compliant voicemail requesting returned call.  CSW will try to contact [facility] again tomorrow to determine a definitive answer about their decision to accept or deny the patient.They were considering placing patient in there new [facility] opening July 1st.   Tony Cunningham MSW, [facility] 11/20/2016 3:17 PM

## 2016-11-20 NOTE — [facility] Group Notes (Signed)
[facility] LCSW Group Therapy  11/20/2016 1:50 PM  Type of Therapy:  Group Therapy  Participation Level:  Patient did not attend group. CSW invited patient to group.   Summary of Progress/Problems: Feelings around Relapse. Group members discussed the meaning of relapse and shared personal stories of relapse, how it affected them and others, and how they perceived themselves during this time. Group members were encouraged to identify triggers, warning signs and coping skills used when facing the possibility of relapse. Social supports were discussed and explored in detail. Patients also discussed facing disappointment and how that can trigger someone to relapse.  Allyce Bochicchio G. Garnette Czech MSW, LCSWA 11/20/2016, 1:51 PM

## 2016-11-21 LAB — GLUCOSE, CAPILLARY
GLUCOSE-CAPILLARY: 183 mg/dL — AB (ref 65–99)
Glucose-Capillary: 241 mg/dL — ABNORMAL HIGH (ref 65–99)
Glucose-Capillary: 121 mg/dL — ABNORMAL HIGH (ref 65–99)
Glucose-Capillary: 136 mg/dL — ABNORMAL HIGH (ref 65–99)

## 2016-11-21 NOTE — Plan of Care (Signed)
Problem: Coping: Goal: Ability to identify and develop effective coping behavior will improve Outcome: Progressing Communicating his needs to staff, attending groups

## 2016-11-21 NOTE — Progress Notes (Signed)
D:Patient noted to have an odor of feces  When questioned patient  Stated not me. Dried blood on door handle , removed by writer patient. Patient  Questioned " Will I ever be aright " Feeling of uncertainty . Informed of  Upcomming  ECT appointment . No  Participation  With  Unit  Programing appetite good and no concerns  With sleep . Denies suicidal ideations   A: Encourage patient participation with unit programming . Instruction  Given on  Medication , verbalize understanding. R: Voice no other concerns. Staff continue to monitor

## 2016-11-21 NOTE — [facility] Group Notes (Signed)
[facility] LCSW Group Therapy  11/21/2016 3:02 PM  Type of Therapy:  Group Therapy  Participation Level:  Patient did not attend group. CSW invited patient to group.   Summary of Progress/Problems: Communications: Patients identify how individuals communicate with one another appropriately and inappropriately. Patients will be guided to discuss their thoughts, feelings, and behaviors related to barriers when communicating. The group will process together ways to execute positive and appropriate communications.   Nedda Gains G. Garnette Czech MSW, LCSWA 11/21/2016, 3:04 PM

## 2016-11-21 NOTE — Plan of Care (Signed)
Problem: Health Behavior/Discharge Planning: Goal: Identification of resources available to assist in meeting health care needs will improve Outcome: Not Progressing Pt reported that he has no support system

## 2016-11-21 NOTE — Plan of Care (Signed)
Problem: Activity: Goal: Will identify at least one activity in which they can participate Outcome: Not Progressing Isolates to room

## 2016-11-21 NOTE — Plan of Care (Signed)
Problem: Activity: Goal: Will identify at least one activity in which they can participate Outcome: Progressing Was able to attend evening group and currently watching TV with peers

## 2016-11-21 NOTE — Plan of Care (Signed)
Problem: Activity: Goal: Interest or engagement in activities will improve Outcome: Not Progressing Limited ability t understand  Concept . Encourage to come to  Group meeting

## 2016-11-21 NOTE — Plan of Care (Signed)
Problem: Activity: Goal: Interest or engagement in activities will improve Outcome: Progressing Encourage to get out of bed

## 2016-11-21 NOTE — Progress Notes (Signed)
[facility] MD Progress Note  11/21/2016 4:58 PM Purl Claytor  MRN:  [id number]  Subjective:  Mr. Headen is a 41 year old male with prior diagnosis of schizoaffective disorder. He received ECT to be continued next week if appropriate.  He was started on Clozapine and his dose was titrated to 225 mg. He is also on Luvox. Clozapine level was quite high with resulting sedation, because of the combination. Unfortunately in ERROR his Clozapine was discotoniued after Monday. I restarted 100 mg of Clozapine tonight. Actually, he is doing well without it, less sedated. Not knowing he was off Clozapine I took another level Thursday night.  11/08/16. The patient has been very isolative and withdrawn. Hygiene is poor and he is malodorous. He has not showered. He did go outside with the other patients and has been getting up for meals but not attending groups or interacting with staff or peers. He denies any current active or passive suicidal thoughts but does admit to some very vague paranoid thoughts that others are watching him. He is very limited and talking with this Probation officer and wanted to be "left alone to sleep". He denied any current somatic complaints. Vital signs are stable and he slept fairly well last night but is also sleeping partially during the daytime. So far, he is tolerating the Clozaril fairly well without any physical adverse side effects.  Follow-up for this 41 year old man with what appears to be psychotic depression or schizoaffective disorder. Patient remains in bed most of the time. Poorly interactive. Mood is depressed. Passive suicidal thoughts.  Follow-up for Thursday the 21st. Patient remains withdrawn very little activity. Blood sugars are also running much higher. Patient continues to endorse feeling depressed and having vague suicidal thoughts.  Follow-up for Friday the 22nd. Patient finally had ECT this morning bilateral treatment which went off without any difficulty or complication. I saw him  this afternoon and I think he actually was a little more interactive and verbal and energetic than before. This may be wishful thinking but it makes me optimistic. No specific complaints other than headache  Patient was seen today for follow-up. Patient has no new complaints. Continues to say his thoughts are racing but his affect is flat and withdrawn he is almost completely withdrawn from other people and stays in his room by himself. Suicidal thoughts passive no intention or plan. Still has psychotic symptoms. Blood sugars unfortunately continued to run quite high.  6/23 patient denies having any issues or concerns today. Says that he has been eating and sleeping well. Denies side effects from medications or any physical complaints. Denies suicidality or homicidality. He says that he might be hallucinating but he is not sure. He said that he had 1 ECT procedure and it went well.   Blood glucose this morning was 92. Patient is compliant with medications. No major events overnight per nursing.  6/24 patient did not sleep well last night. Other than that he denies having any issues or concerns. He denies suicidality, homicidality or auditory visual hallucinations. During examination he appears much brighter and more conversational. His thought processes are linear.  11/16/2016. Mr. Glazebrook does not like talking to me today. He is in his room he is in bed He does not open his eyes for me and is unable to keep a conversation he keeps telling me okay okay and is unwilling to answer any questions. He received just one ECT treatment last week. Since Dr. Weber Cooks is on vacation, ECT will resume next week.  He  accepts medications and seems to tolerate them well Clozaril level is over 700. This is probably from a combination of clozapine and Luvox. I will alert dose of clozapine to 200 mg  To avoid side effects. I will switch to Luvox to 1 nightly dose to improve sleep.  11/17/2016. [name] is much more animated and  interactive today. He is in his room in bed bugs is able to hold a conversation and maintains good eye contact. He complains of stomach ache. He did have a bowel movement this morning. His mood is still depressed and he needs much encouragement to attend to his ADLs. Staff needs to strongly encourage showering. He has high hopes for ECT and will not stay in the hospital until next week to resume ECT with Dr. Weber Cooks Is back from vacation. Yesterday he is agreeable to the nighttime and clozapine dose decrease due to high clozapine level. This could have helped the patient stated more awake this morning. He slept almost 6 hours.  11/18/2016. Mr. Schweitzer seems more relaxed and talkative today. He is still very flat on his back but opens his eyes for me to we are having a small conversation. He has not been sleeping well. There are in spite of medication adjustments. He believes that trazodone works well for him. I will start trazodone tonight. She no longer complains of stomach pain. He does not participate in programming but takes medications with no problem. She met with the group home representative this week and was accepted to a group home with a bed open at the beginning of July. When asked returns from vacation, he will decide whether to continue ECT or whether the patient will be discharged.  11/19/2016. Mr. Lowder seems better today. This morning he has been up and walking around. He complains of "tension" in his head both mental and physical. He slept 3 hours only again in spite of multiple sedating medications given at night including Trazodone. I will switch him to Restoril tonight. It may need to be discontinued if her restarts ECT next week. No other somatic complaints. Sugers still elevated at times. I changed diet to ADA and discontinued Glucerna. Input from diabetes nurse coordinator and medicine consultant is greatly appreciated. He does not interact with staff/peers or participate in programing. Hygiene  is still questionable.  11/20/2016. Mr. Peake is up and about today. He comes to my office for interview. He seems more relaxed today. Still very vague in his answers. Complains of back and neck pain but has been in bed on his back most of the week. His sugar was low today as he was switched to ADA diet and Glucerna was discontinued upon [facility] suggestion. He is looking forward to see Dr. Weber Cooks "who is the best". He corrects himself to tell me that I am not bad either.  Follow-up for June 30. Patient seen. Spoke with nursing. Patient continues to report feeling bad. Mood feels depressed. Denies acute suicidal thoughts but feels hopeless about the future. Feels paranoid and vaguely report psychosis. Patient continues to have poor self-care and poor interaction with others on the unit. Physically he seems to be stabilizing a little.  Per nursing: Pt calm, cooperative and pleasant.  AM BS 149.  Speech is more clear and logical, though remains hypoverbal.  Complains of muscle pain in neck starting approx. 1 week ago.  PRN acetaminophen offered and eventually accepted.  Reports moderate relief.  Remains frequently isolative to room.  No behavioral issues.  Principal Problem: Schizoaffective disorder, depressive type ([place]) Diagnosis:   Patient Active Problem List   Diagnosis Date Noted  . Schizoaffective disorder, depressive type ([place]) [F25.1] 09/23/2016  . Polysubstance abuse [F19.10] 07/17/2016  . Tardive dyskinesia [G24.01] 07/16/2016  . Tobacco use disorder [F17.200] 07/07/2016  . Dyslipidemia [E78.5] 07/07/2016  . Asthma [J45.909] 07/07/2016  . HTN (hypertension) [I10] 07/06/2016  . Diabetes ([place]) [E11.9] 12/25/2010   Total Time spent with patient: 30 minutes    Past Medical History:  Past Medical History:  Diagnosis Date  . Anxiety   . Asthma   . Diabetes mellitus   . High blood pressure   . Sinus complaint    History reviewed. No pertinent surgical history.  Social History:   History  Alcohol Use No     History  Drug Use No    Social History   Social History  . Marital status: Single    Spouse name: [name]  . Number of children: [name]  . Years of education: [name]   Social History Main Topics  . Smoking status: Current Every Day Smoker    Packs/day: 0.50    Types: Cigarettes  . Smokeless tobacco: Never Used  . Alcohol use No  . Drug use: No  . Sexual activity: Not Currently   Other Topics Concern  . None   Social History Narrative  . None   Additional Social History:                         Sleep: Good  Appetite:  Good  Current Medications: Current Facility-Administered Medications  Medication Dose Route Frequency Provider Last Rate Last Dose  . acetaminophen (TYLENOL) tablet 650 mg  650 mg Oral Q6H PRN [name], Madie Reno, MD   650 mg at 11/19/16 1739  . alum & mag hydroxide-simeth (MAALOX/MYLANTA) 200-200-20 MG/5ML suspension 30 mL  30 mL Oral Q4H PRN [name], [name], MD      . cloZAPine (CLOZARIL) tablet 100 mg  100 mg Oral QHS [name], [name], MD   100 mg at 11/20/16 2156  . dextrose 5 %-0.45 % sodium chloride infusion   Intravenous Continuous [name], Madie Reno, MD 10 mL/hr at 11/13/16 1009    . fluvoxaMINE (LUVOX) tablet 100 mg  100 mg Oral QHS [name], [name], MD   100 mg at 11/20/16 2155  . insulin aspart (novoLOG) injection 0-15 Units  0-15 Units Subcutaneous TID WC Loletha Grayer, MD   3 Units at 11/21/16 1637  . insulin aspart (novoLOG) injection 0-5 Units  0-5 Units Subcutaneous QHS Loletha Grayer, MD   4 Units at 11/18/16 2158  . insulin aspart (novoLOG) injection 10 Units  10 Units Subcutaneous TID WC Fritzi Mandes, MD   10 Units at 11/21/16 1638  . insulin glargine (LANTUS) injection 55 Units  55 Units Subcutaneous QHS Fritzi Mandes, MD   55 Units at 11/20/16 2201  . lisinopril (PRINIVIL,ZESTRIL) tablet 20 mg  20 mg Oral Daily [name], Madie Reno, MD   20 mg at 11/21/16 0746  . magnesium hydroxide (MILK OF MAGNESIA)  suspension 30 mL  30 mL Oral Daily PRN [name], [name], MD      . meloxicam (MOBIC) tablet 7.5 mg  7.5 mg Oral BID PC [name], [name], MD   7.5 mg at 11/21/16 0749  . metFORMIN (GLUCOPHAGE) tablet 1,000 mg  1,000 mg Oral BID WC [name], Madie Reno, MD   1,000 mg at 11/21/16 0745  . montelukast (  SINGULAIR) tablet 10 mg  10 mg Oral QHS [name], [name], MD   10 mg at 11/20/16 2156  . nicotine (NICODERM CQ - dosed in mg/24 hr) patch 7 mg  7 mg Transdermal Daily Chauncey Mann, MD   7 mg at 11/21/16 0752  . pantoprazole (PROTONIX) EC tablet 40 mg  40 mg Oral Daily [name], [name], MD   40 mg at 11/21/16 0746  . polyethylene glycol (MIRALAX / GLYCOLAX) packet 17 g  17 g Oral Daily [name], Madie Reno, MD   17 g at 11/21/16 2637  . promethazine (PHENERGAN) tablet 12.5 mg  12.5 mg Oral Q6H PRN [name], [name], MD      . propranolol (INDERAL) tablet 20 mg  20 mg Oral BID [name], Madie Reno, MD   20 mg at 11/21/16 0747  . simvastatin (ZOCOR) tablet 40 mg  40 mg Oral q1800 [name], Madie Reno, MD   40 mg at 11/20/16 1714  . temazepam (RESTORIL) capsule 15 mg  15 mg Oral QHS [name], [name], MD   15 mg at 11/20/16 2157    Lab Results:  Results for orders placed or performed during the hospital encounter of 11/06/16 (from the past 48 hour(s))  Glucose, capillary     Status: Abnormal   Collection Time: 11/19/16  8:42 PM  Result Value Ref Range   Glucose-Capillary 133 (H) 65 - 99 mg/dL  Glucose, capillary     Status: Abnormal   Collection Time: 11/20/16  6:47 AM  Result Value Ref Range   Glucose-Capillary 238 (H) 65 - 99 mg/dL  Glucose, capillary     Status: Abnormal   Collection Time: 11/20/16 11:38 AM  Result Value Ref Range   Glucose-Capillary 56 (L) 65 - 99 mg/dL  Glucose, capillary     Status: Abnormal   Collection Time: 11/20/16 12:00 PM  Result Value Ref Range   Glucose-Capillary 103 (H) 65 - 99 mg/dL  Glucose, capillary     Status: Abnormal   Collection Time: 11/20/16  4:33 PM  Result Value  Ref Range   Glucose-Capillary 188 (H) 65 - 99 mg/dL  Glucose, capillary     Status: Abnormal   Collection Time: 11/20/16  9:54 PM  Result Value Ref Range   Glucose-Capillary 186 (H) 65 - 99 mg/dL  Glucose, capillary     Status: Abnormal   Collection Time: 11/21/16  6:30 AM  Result Value Ref Range   Glucose-Capillary 241 (H) 65 - 99 mg/dL  Glucose, capillary     Status: Abnormal   Collection Time: 11/21/16 11:34 AM  Result Value Ref Range   Glucose-Capillary 121 (H) 65 - 99 mg/dL  Glucose, capillary     Status: Abnormal   Collection Time: 11/21/16  4:32 PM  Result Value Ref Range   Glucose-Capillary 183 (H) 65 - 99 mg/dL   Comment 1 Notify RN     Blood Alcohol level:  Lab Results  Component Value Date   ETH <5 10/21/2016   ETH <5 85/88/5027    Metabolic Disorder Labs: Lab Results  Component Value Date   HGBA1C 10.3 (H) 11/09/2016   MPG 249 11/09/2016   MPG 275 10/09/2016   Lab Results  Component Value Date   PROLACTIN 24.5 (H) 09/24/2016   PROLACTIN 3.4 (L) 07/07/2016   Lab Results  Component Value Date   CHOL 92 11/09/2016   TRIG 54 11/09/2016   HDL 39 (L) 11/09/2016   CHOLHDL 2.4 11/09/2016   VLDL 11  11/09/2016   LDLCALC 42 11/09/2016   LDLCALC 72 10/09/2016    Physical Findings: AIMS: Facial and Oral Movements Muscles of Facial Expression: Minimal Lips and Perioral Area: Mild Jaw: Minimal Tongue: Minimal,Extremity Movements Upper (arms, wrists, hands, fingers): None, normal Lower (legs, knees, ankles, toes): Minimal, Trunk Movements Neck, shoulders, hips: None, normal, Overall Severity Severity of abnormal movements (highest score from questions above): Minimal Incapacitation due to abnormal movements: Minimal Patient's awareness of abnormal movements (rate only patient's report): No Awareness, Dental Status Current problems with teeth and/or dentures?: No Does patient usually wear dentures?: No   Musculoskeletal: Strength & Muscle Tone: within  normal limits Gait & Station: normal Patient leans: [name]  Psychiatric Specialty Exam: Physical Exam  Nursing note and vitals reviewed. Constitutional: He appears well-developed and well-nourished.  HENT:  Head: Normocephalic and atraumatic.  Eyes: Conjunctivae are normal. Pupils are equal, round, and reactive to light.  Neck: Normal range of motion.  Cardiovascular: Regular rhythm and normal heart sounds.   Respiratory: Effort normal.  GI: Soft.  Musculoskeletal: Normal range of motion.  Neurological: He is alert.  Skin: Skin is warm and dry.  Psychiatric: His affect is blunt. His speech is delayed. He is slowed. Thought content is paranoid and delusional. Cognition and memory are normal. He expresses impulsivity.    Review of Systems  Constitutional: Negative.   HENT: Negative.   Eyes: Negative.   Cardiovascular: Negative.   Gastrointestinal: Negative.   Genitourinary: Negative.   Musculoskeletal: Negative.   Skin: Negative.   Neurological: Negative.   Endo/Heme/Allergies: Negative.   Psychiatric/Behavioral: Positive for depression and hallucinations.    Blood pressure 125/85, pulse 84, temperature 98.9 F (37.2 C), temperature source Oral, resp. rate 18, height _0  (1.727 m), weight 170 lb (77.1 kg), SpO2 100 %.Body mass index is 25.85 kg/m.  General Appearance: Disheveled  Eye Contact:  Poor  Speech:  Slow  Volume:  Decreased  Mood:  Dysphoric  Affect:  Blunt  Thought Process:  Linear  Orientation:  Full (Time, Place, and Person)  Thought Content:  Paranoid Ideation  Suicidal Thoughts:  No  Homicidal Thoughts:  No  Memory:  Immediate;   Fair Recent;   Fair Remote;   Fair  Judgement:  Impaired  Insight:  Lacking  Psychomotor Activity:  Decreased  Concentration:  Concentration: Fair and Attention Span: Fair  Recall:  [employer] of Knowledge:  Fair  Language:  Good  Akathisia:  No  Handed:  Right  AIMS (if indicated):     Assets:  Housing Physical Health   ADL's:  Intact  Cognition:  WNL  Sleep:  Number of Hours: 6.25     Treatment Plan Summary:  Mr. Mckillop 41 year old male with prior diagnosis of schizoaffective disorder who presented to the emergency room with vague suicidal thoughts as well as paranoid thoughts. He does. Be responding to internal stimuli and has endorsed some auditory and visual hallucinations. He will be admitted to inpatient psychiatry for medication management, safety and stabilization.  1. Schizoaffective disorder, depressive type. He was started on Clozaril and his dose was increased to 225 mg nightly for psychosis. Clozapine level 748 most likely due to interaction with the Luvox. I tried to lower Clozapine dose but in ERROR we discontinue Clozapine altogether. Last dose was given on Monday. I ordered Clozapine level on Friday unnecessarily. I restarted 100 mg of Clozapine tonight.   2. ECT. Patient had ECT on Friday,11/13/2016 for the first time in this hospitalization. I am  optimistic that this treatment may make a real difference. Unfortunately I will be away from the hospital all of next week. I am leaving the patient in the care of Dr. Mamie Nick and will sign out to her. If the patient is still in the hospital after this next week we will plan to continue ECT treatment picking up again on July 2.   3. Metabolic syndrome monitoring. Lipid panel is normal, hemoglobin A1c 103.  4. EKG. Normal sinus rhythm, QTc 417.  5. Questionable history of polysubstance abuse: The patient denies any history of any polysubstance use but records indicate that there are may have been a history of polysubstance use. He was advised to abstain from alcohol and all illicit drugs as it may worsen anxiety mood symptoms as well as psychosis. The patient is not interested in any substance abuse treatment.  6. Nicotine use disorder. Nicotine patch is available.   7. Diabetes. He is on Metformin, Novolog 10 units with meals and Lantus 55 units,  along with ADA diet and blood sugar monitoring. Medicine input is greatly appreciated. Glucerna is discontinued.  8. Hypertension: Vital signs are stable. We'll continue lisinopril 20 mg daily.  9. Seasonal allergies: Continue Singulair 10 mg nightly  10. Anxiety/restlessness. He is on Propranolol.   11. Insomnia. We discontinued Trazodone and started Restoril. Slept 7.5 hours.  12. Back pain. We will give Robaxin.  13. Disposition: The patient will return to a group home. He will follow up with [facility] in [facility]. Consider ACT team.  I still think there is a good chance of improvement with ECT and he is on the schedule to continue treatment into this next week. Spoke with social work about starting preparations for appropriate placement. During the last week there were some changes in his medicine because of elevated clozapine levels. Totally agree with the current treatment and medication levels. No other change today. Supportive counseling and review of plan with the patient.    Alethia Berthold, MD 11/21/2016, 4:58 PM

## 2016-11-21 NOTE — Plan of Care (Signed)
Problem: Coping: Goal: Ability to interact with others will improve Outcome: Progressing Had a good conversation with staff. Currently watching TV, talking to peers

## 2016-11-21 NOTE — Progress Notes (Signed)
Patient was visible in the milieu. Came to the medication room for medications and was compliant and he went to the dayroom for snack. He had minimal interaction with staff and peers. Disheveled with poor hygiene. Patient denied SI/HI/hallucinations. . He appears to be resitng in bed quietly at this time.

## 2016-11-21 NOTE — [facility] Group Notes (Signed)
[facility] Group Notes:  (Nursing/MHT/Case Management/Adjunct)  Date:  11/21/2016  Time:  5:20 AM  Type of Therapy:  Psychoeducational Skills  Participation Level:  Did Not Attend  Summary of Progress/Problems:  Tony Cunningham 11/21/2016, 5:20 AM

## 2016-11-21 NOTE — Plan of Care (Signed)
Problem: Health Behavior/Discharge Planning: Goal: Compliance with treatment plan for underlying cause of condition will improve Outcome: Progressing Pt taking medications and attended evening group

## 2016-11-21 NOTE — Progress Notes (Signed)
Patient has been more visible in the milieu, more interactive. Attended group and currently watching TV with peers. Presented to the medication room with improved mood. Was able to hold a meaningful conversation with nurses. Pleasant, smiling. PT took his bed time medications. CBG 136: no coverage. Had no concerns. Support and encouragements provided. Safety precautions maintained.

## 2016-11-22 ENCOUNTER — Other Ambulatory Visit: Payer: Self-pay | Admitting: Psychiatry

## 2016-11-22 LAB — GLUCOSE, CAPILLARY
GLUCOSE-CAPILLARY: 136 mg/dL — AB (ref 65–99)
GLUCOSE-CAPILLARY: 188 mg/dL — AB (ref 65–99)
GLUCOSE-CAPILLARY: 133 mg/dL — AB (ref 65–99)

## 2016-11-22 NOTE — Plan of Care (Signed)
Problem: Safety: Goal: Ability to remain free from injury will improve Outcome: Progressing Pt remains safe in hospital. Pt displays safe behaviors

## 2016-11-22 NOTE — [facility] Group Notes (Signed)
[facility] LCSW Group Therapy  11/22/2016 2:22 PM  Type of Therapy:  Group Therapy  Participation Level:  Patient did not attend group. CSW invited patient to group.   Summary of Progress/Problems: Stress management: Patients defined and discussed the topic of stress and the related symptoms and triggers for stress. Patients identified healthy coping skills they would like to try during hospitalization and after discharge to manage stress in a healthy way. CSW offered insight to varying stress management techniques.   Indianna Boran G. Garnette Czech MSW, LCSWA 11/22/2016, 2:23 PM

## 2016-11-22 NOTE — Progress Notes (Signed)
RN told pt to get up and shower and handed toiletries this morning. Pt educated on the importance of personal hygiene. He continues to be isolative to bed. Pt denies SI, HI, a/v hallucinations. He is very cooperative with staff. Will continue to monitor.

## 2016-11-22 NOTE — Progress Notes (Signed)
[facility] MD Progress Note  11/22/2016 1:42 PM Tony Cunningham  MRN:  [id number]  Subjective:  Tony Cunningham is a 41 year old male with prior diagnosis of schizoaffective disorder. He received ECT to be continued next week if appropriate.  He was started on Clozapine and his dose was titrated to 225 mg. He is also on Luvox. Clozapine level was quite high with resulting sedation, because of the combination. Unfortunately in ERROR his Clozapine was discotoniued after Monday. I restarted 100 mg of Clozapine tonight. Actually, he is doing well without it, less sedated. Not knowing he was off Clozapine I took another level Thursday night.  11/08/16. The patient has been very isolative and withdrawn. Hygiene is poor and he is malodorous. He has not showered. He did go outside with the other patients and has been getting up for meals but not attending groups or interacting with staff or peers. He denies any current active or passive suicidal thoughts but does admit to some very vague paranoid thoughts that others are watching him. He is very limited and talking with this Probation officer and wanted to be "left alone to sleep". He denied any current somatic complaints. Vital signs are stable and he slept fairly well last night but is also sleeping partially during the daytime. So far, he is tolerating the Clozaril fairly well without any physical adverse side effects.  Follow-up for this 41 year old man with what appears to be psychotic depression or schizoaffective disorder. Patient remains in bed most of the time. Poorly interactive. Mood is depressed. Passive suicidal thoughts.  Follow-up for Thursday the 21st. Patient remains withdrawn very little activity. Blood sugars are also running much higher. Patient continues to endorse feeling depressed and having vague suicidal thoughts.  Follow-up for Friday the 22nd. Patient finally had ECT this morning bilateral treatment which went off without any difficulty or complication. I saw him  this afternoon and I think he actually was a little more interactive and verbal and energetic than before. This may be wishful thinking but it makes me optimistic. No specific complaints other than headache  Patient was seen today for follow-up. Patient has no new complaints. Continues to say his thoughts are racing but his affect is flat and withdrawn he is almost completely withdrawn from other people and stays in his room by himself. Suicidal thoughts passive no intention or plan. Still has psychotic symptoms. Blood sugars unfortunately continued to run quite high.  6/23 patient denies having any issues or concerns today. Says that he has been eating and sleeping well. Denies side effects from medications or any physical complaints. Denies suicidality or homicidality. He says that he might be hallucinating but he is not sure. He said that he had 1 ECT procedure and it went well.   Blood glucose this morning was 92. Patient is compliant with medications. No major events overnight per nursing.  6/24 patient did not sleep well last night. Other than that he denies having any issues or concerns. He denies suicidality, homicidality or auditory visual hallucinations. During examination he appears much brighter and more conversational. His thought processes are linear.  11/16/2016. Tony Cunningham does not like talking to me today. He is in his room he is in bed He does not open his eyes for me and is unable to keep a conversation he keeps telling me okay okay and is unwilling to answer any questions. He received just one ECT treatment last week. Since Dr. Weber Cooks is on vacation, ECT will resume next week.  He  accepts medications and seems to tolerate them well Clozaril level is over 700. This is probably from a combination of clozapine and Luvox. I will alert dose of clozapine to 200 mg  To avoid side effects. I will switch to Luvox to 1 nightly dose to improve sleep.  11/17/2016. Tony Cunningham is much more animated and  interactive today. He is in his room in bed bugs is able to hold a conversation and maintains good eye contact. He complains of stomach ache. He did have a bowel movement this morning. His mood is still depressed and he needs much encouragement to attend to his ADLs. Staff needs to strongly encourage showering. He has high hopes for ECT and will not stay in the hospital until next week to resume ECT with Dr. Weber Cooks Is back from vacation. Yesterday he is agreeable to the nighttime and clozapine dose decrease due to high clozapine level. This could have helped the patient stated more awake this morning. He slept almost 6 hours.  11/18/2016. Tony Cunningham seems more relaxed and talkative today. He is still very flat on his back but opens his eyes for me to we are having a small conversation. He has not been sleeping well. There are in spite of medication adjustments. He believes that trazodone works well for him. I will start trazodone tonight. She no longer complains of stomach pain. He does not participate in programming but takes medications with no problem. She met with the group home representative this week and was accepted to a group home with a bed open at the beginning of July. When asked returns from vacation, he will decide whether to continue ECT or whether the patient will be discharged.  11/19/2016. Tony Cunningham seems better today. This morning he has been up and walking around. He complains of "tension" in his head both mental and physical. He slept 3 hours only again in spite of multiple sedating medications given at night including Trazodone. I will switch him to Restoril tonight. It may need to be discontinued if her restarts ECT next week. No other somatic complaints. Sugers still elevated at times. I changed diet to ADA and discontinued Glucerna. Input from diabetes nurse coordinator and medicine consultant is greatly appreciated. He does not interact with staff/peers or participate in programing. Hygiene  is still questionable.  11/20/2016. Tony Cunningham is up and about today. He comes to my office for interview. He seems more relaxed today. Still very vague in his answers. Complains of back and neck pain but has been in bed on his back most of the week. His sugar was low today as he was switched to ADA diet and Glucerna was discontinued upon [facility] suggestion. He is looking forward to see Dr. Weber Cooks "who is the best". He corrects himself to tell me that I am not bad either.  Follow-up for June 30. Patient seen. Spoke with nursing. Patient continues to report feeling bad. Mood feels depressed. Denies acute suicidal thoughts but feels hopeless about the future. Feels paranoid and vaguely report psychosis. Patient continues to have poor self-care and poor interaction with others on the unit. Physically he seems to be stabilizing a little.  Follow-up Sunday, July 1. Patient seen. He looks a little better groomed today. He has been up out of his bed although he does not attend any groups. He is ambivalent about his mood today. Denies acute hallucinations or suicidal thinking. Still feels hopeless about discharge.  Per nursing: Pt calm, cooperative and pleasant.  AM  BS 149.  Speech is more clear and logical, though remains hypoverbal.  Complains of muscle pain in neck starting approx. 1 week ago.  PRN acetaminophen offered and eventually accepted.  Reports moderate relief.  Remains frequently isolative to room.  No behavioral issues.    Principal Problem: Schizoaffective disorder, depressive type ([place]) Diagnosis:   Patient Active Problem List   Diagnosis Date Noted  . Schizoaffective disorder, depressive type ([place]) [F25.1] 09/23/2016  . Polysubstance abuse [F19.10] 07/17/2016  . Tardive dyskinesia [G24.01] 07/16/2016  . Tobacco use disorder [F17.200] 07/07/2016  . Dyslipidemia [E78.5] 07/07/2016  . Asthma [J45.909] 07/07/2016  . HTN (hypertension) [I10] 07/06/2016  . Diabetes ([place]) [E11.9] 12/25/2010    Total Time spent with patient: 30 minutes    Past Medical History:  Past Medical History:  Diagnosis Date  . Anxiety   . Asthma   . Diabetes mellitus   . High blood pressure   . Sinus complaint    History reviewed. No pertinent surgical history.  Social History:  History  Alcohol Use No     History  Drug Use No    Social History   Social History  . Marital status: Single    Spouse name: [name]  . Number of children: [name]  . Years of education: [name]   Social History Main Topics  . Smoking status: Current Every Day Smoker    Packs/day: 0.50    Types: Cigarettes  . Smokeless tobacco: Never Used  . Alcohol use No  . Drug use: No  . Sexual activity: Not Currently   Other Topics Concern  . None   Social History Narrative  . None   Additional Social History:                         Sleep: Good  Appetite:  Good  Current Medications: Current Facility-Administered Medications  Medication Dose Route Frequency Provider Last Rate Last Dose  . acetaminophen (TYLENOL) tablet 650 mg  650 mg Oral Q6H PRN Rickell Wiehe, Madie Reno, MD   650 mg at 11/19/16 1739  . alum & mag hydroxide-simeth (MAALOX/MYLANTA) 200-200-20 MG/5ML suspension 30 mL  30 mL Oral Q4H PRN Irmalee Riemenschneider T, MD      . cloZAPine (CLOZARIL) tablet 100 mg  100 mg Oral QHS [name], [name], MD   100 mg at 11/21/16 2138  . dextrose 5 %-0.45 % sodium chloride infusion   Intravenous Continuous Barkley Kratochvil, Madie Reno, MD 10 mL/hr at 11/13/16 1009    . fluvoxaMINE (LUVOX) tablet 100 mg  100 mg Oral QHS [name], [name], MD   100 mg at 11/21/16 2139  . insulin aspart (novoLOG) injection 0-15 Units  0-15 Units Subcutaneous TID WC Loletha Grayer, MD   3 Units at 11/22/16 1122  . insulin aspart (novoLOG) injection 0-5 Units  0-5 Units Subcutaneous QHS Loletha Grayer, MD   4 Units at 11/18/16 2158  . insulin aspart (novoLOG) injection 10 Units  10 Units Subcutaneous TID WC Fritzi Mandes, MD   10 Units at 11/22/16  1122  . insulin glargine (LANTUS) injection 55 Units  55 Units Subcutaneous QHS Fritzi Mandes, MD   55 Units at 11/21/16 2138  . lisinopril (PRINIVIL,ZESTRIL) tablet 20 mg  20 mg Oral Daily Aileana Hodder, Madie Reno, MD   20 mg at 11/22/16 0813  . magnesium hydroxide (MILK OF MAGNESIA) suspension 30 mL  30 mL Oral Daily PRN Jonae Renshaw, Madie Reno, MD      . meloxicam (  MOBIC) tablet 7.5 mg  7.5 mg Oral BID PC Lavoris Canizales T, MD   7.5 mg at 11/22/16 0815  . metFORMIN (GLUCOPHAGE) tablet 1,000 mg  1,000 mg Oral BID WC Micky Overturf, Madie Reno, MD   1,000 mg at 11/22/16 0813  . montelukast (SINGULAIR) tablet 10 mg  10 mg Oral QHS Makinzy Cleere, Madie Reno, MD   10 mg at 11/21/16 2138  . nicotine (NICODERM CQ - dosed in mg/24 hr) patch 7 mg  7 mg Transdermal Daily Chauncey Mann, MD   7 mg at 11/21/16 0752  . pantoprazole (PROTONIX) EC tablet 40 mg  40 mg Oral Daily [name], [name], MD   40 mg at 11/22/16 0813  . polyethylene glycol (MIRALAX / GLYCOLAX) packet 17 g  17 g Oral Daily Bretta Fees, Madie Reno, MD   17 g at 11/21/16 7829  . promethazine (PHENERGAN) tablet 12.5 mg  12.5 mg Oral Q6H PRN Bentli Llorente T, MD      . propranolol (INDERAL) tablet 20 mg  20 mg Oral BID Valia Wingard, Madie Reno, MD   20 mg at 11/22/16 0813  . simvastatin (ZOCOR) tablet 40 mg  40 mg Oral q1800 Samamtha Tiegs, Madie Reno, MD   40 mg at 11/21/16 1703  . temazepam (RESTORIL) capsule 15 mg  15 mg Oral QHS [name], [name], MD   15 mg at 11/21/16 2138    Lab Results:  Results for orders placed or performed during the hospital encounter of 11/06/16 (from the past 48 hour(s))  Glucose, capillary     Status: Abnormal   Collection Time: 11/20/16  4:33 PM  Result Value Ref Range   Glucose-Capillary 188 (H) 65 - 99 mg/dL  Glucose, capillary     Status: Abnormal   Collection Time: 11/20/16  9:54 PM  Result Value Ref Range   Glucose-Capillary 186 (H) 65 - 99 mg/dL  Glucose, capillary     Status: Abnormal   Collection Time: 11/21/16  6:30 AM  Result Value Ref Range    Glucose-Capillary 241 (H) 65 - 99 mg/dL  Glucose, capillary     Status: Abnormal   Collection Time: 11/21/16 11:34 AM  Result Value Ref Range   Glucose-Capillary 121 (H) 65 - 99 mg/dL  Glucose, capillary     Status: Abnormal   Collection Time: 11/21/16  4:32 PM  Result Value Ref Range   Glucose-Capillary 183 (H) 65 - 99 mg/dL   Comment 1 Notify RN   Glucose, capillary     Status: Abnormal   Collection Time: 11/21/16  8:42 PM  Result Value Ref Range   Glucose-Capillary 136 (H) 65 - 99 mg/dL   Comment 1 Notify RN   Glucose, capillary     Status: Abnormal   Collection Time: 11/22/16  6:28 AM  Result Value Ref Range   Glucose-Capillary 136 (H) 65 - 99 mg/dL   Comment 1 Notify RN   Glucose, capillary     Status: Abnormal   Collection Time: 11/22/16 11:19 AM  Result Value Ref Range   Glucose-Capillary 188 (H) 65 - 99 mg/dL    Blood Alcohol level:  Lab Results  Component Value Date   ETH <5 10/21/2016   ETH <5 56/21/3086    Metabolic Disorder Labs: Lab Results  Component Value Date   HGBA1C 10.3 (H) 11/09/2016   MPG 249 11/09/2016   MPG 275 10/09/2016   Lab Results  Component Value Date   PROLACTIN 24.5 (H) 09/24/2016   PROLACTIN 3.4 (L) 07/07/2016  Lab Results  Component Value Date   CHOL 92 11/09/2016   TRIG 54 11/09/2016   HDL 39 (L) 11/09/2016   CHOLHDL 2.4 11/09/2016   VLDL 11 11/09/2016   LDLCALC 42 11/09/2016   LDLCALC 72 10/09/2016    Physical Findings: AIMS: Facial and Oral Movements Muscles of Facial Expression: Minimal Lips and Perioral Area: Mild Jaw: Minimal Tongue: Minimal,Extremity Movements Upper (arms, wrists, hands, fingers): None, normal Lower (legs, knees, ankles, toes): Minimal, Trunk Movements Neck, shoulders, hips: None, normal, Overall Severity Severity of abnormal movements (highest score from questions above): Minimal Incapacitation due to abnormal movements: Minimal Patient's awareness of abnormal movements (rate only patient's  report): No Awareness, Dental Status Current problems with teeth and/or dentures?: No Does patient usually wear dentures?: No   Musculoskeletal: Strength & Muscle Tone: within normal limits Gait & Station: normal Patient leans: [name]  Psychiatric Specialty Exam: Physical Exam  Nursing note and vitals reviewed. Constitutional: He appears well-developed and well-nourished.  HENT:  Head: Normocephalic and atraumatic.  Eyes: Conjunctivae are normal. Pupils are equal, round, and reactive to light.  Neck: Normal range of motion.  Cardiovascular: Regular rhythm and normal heart sounds.   Respiratory: Effort normal.  GI: Soft.  Musculoskeletal: Normal range of motion.  Neurological: He is alert.  Skin: Skin is warm and dry.  Psychiatric: His affect is blunt. His speech is delayed. He is slowed. Thought content is paranoid. Thought content is not delusional. Cognition and memory are normal. He expresses impulsivity.    Review of Systems  Constitutional: Negative.   HENT: Negative.   Eyes: Negative.   Cardiovascular: Negative.   Gastrointestinal: Negative.   Genitourinary: Negative.   Musculoskeletal: Negative.   Skin: Negative.   Neurological: Negative.   Endo/Heme/Allergies: Negative.   Psychiatric/Behavioral: Positive for depression and hallucinations.    Blood pressure 105/80, pulse 79, temperature 98.6 F (37 C), temperature source Oral, resp. rate 18, height '5\' 8"'  (1.727 m), weight 170 lb (77.1 kg), SpO2 100 %.Body mass index is 25.85 kg/m.  General Appearance: Disheveled  Eye Contact:  Poor  Speech:  Slow  Volume:  Decreased  Mood:  Dysphoric  Affect:  Blunt  Thought Process:  Linear  Orientation:  Full (Time, Place, and Person)  Thought Content:  Paranoid Ideation  Suicidal Thoughts:  No  Homicidal Thoughts:  No  Memory:  Immediate;   Fair Recent;   Fair Remote;   Fair  Judgement:  Impaired  Insight:  Lacking  Psychomotor Activity:  Decreased  Concentration:   Concentration: Fair and Attention Span: Fair  Recall:  [employer] of Knowledge:  Fair  Language:  Good  Akathisia:  No  Handed:  Right  AIMS (if indicated):     Assets:  Housing Physical Health  ADL's:  Intact  Cognition:  WNL  Sleep:  Number of Hours: 8     Treatment Plan Summary:  Tony Cunningham 41 year old male with prior diagnosis of schizoaffective disorder who presented to the emergency room with vague suicidal thoughts as well as paranoid thoughts. He does. Be responding to internal stimuli and has endorsed some auditory and visual hallucinations. He will be admitted to inpatient psychiatry for medication management, safety and stabilization.  1. Schizoaffective disorder, depressive type. He was started on Clozaril and his dose was increased to 225 mg nightly for psychosis. Clozapine level 748 most likely due to interaction with the Luvox. I tried to lower Clozapine dose but in ERROR we discontinue Clozapine altogether. Last dose was given  on Monday. I ordered Clozapine level on Friday unnecessarily. I restarted 100 mg of Clozapine tonight.   2. ECT. Patient had ECT on Friday,11/13/2016 for the first time in this hospitalization. I am optimistic that this treatment may make a real difference. Unfortunately I will be away from the hospital all of next week. I am leaving the patient in the care of Dr. Mamie Nick and will sign out to her. If the patient is still in the hospital after this next week we will plan to continue ECT treatment picking up again on July 2.   3. Metabolic syndrome monitoring. Lipid panel is normal, hemoglobin A1c 103.  4. EKG. Normal sinus rhythm, QTc 417.  5. Questionable history of polysubstance abuse: The patient denies any history of any polysubstance use but records indicate that there are may have been a history of polysubstance use. He was advised to abstain from alcohol and all illicit drugs as it may worsen anxiety mood symptoms as well as psychosis. The patient is  not interested in any substance abuse treatment.  6. Nicotine use disorder. Nicotine patch is available.   7. Diabetes. He is on Metformin, Novolog 10 units with meals and Lantus 55 units, along with ADA diet and blood sugar monitoring. Medicine input is greatly appreciated. Glucerna is discontinued.  8. Hypertension: Vital signs are stable. We'll continue lisinopril 20 mg daily.  9. Seasonal allergies: Continue Singulair 10 mg nightly  10. Anxiety/restlessness. He is on Propranolol.   11. Insomnia. We discontinued Trazodone and started Restoril. Slept 7.5 hours.  12. Back pain. We will give Robaxin.  13. Disposition: The patient will return to a group home. He will follow up with [facility] in [facility]. Consider ACT team.  Patient is on schedule for ECT tomorrow. No change to medicine today. Seems to be doing a little better with the lower clozapine dose not so sedated. Spoke with social work. Efforts are underway to find a appropriate group home placement for him at the time of discharge.    Alethia Berthold, MD 11/22/2016, 1:42 PM

## 2016-11-22 NOTE — Progress Notes (Addendum)
Patient ID: Tony Cunningham, male   DOB: [birth year], 41 y.o.   MRN: [id number]  Case Management Update 11/22/2016  CSW contacted Group home manager Lupita Leash [phone] from [facility] to follow-up about their decision to accept or deny patient. Lupita Leash advised CSW she no longer has beds available at this time.   CSW contacted Tami Lin [phone]) from [facility] to inquire about her bed availability. Informed CSW she may potentially have a bed coming available this upcoming week but she won't have a clear answer until 11/23/2016. [name] needs to contacted on 11/23/2016 for definitive answer about available beds.   CSW also attempted to contact Janace Aris with [facility] again at [phone]. Someone answered the phone stating she was unavailable at this time and to call her back on Monday 11/23/2016.   Meda Dudzinski G. Garnette Czech MSW, LCSWA 11/22/2016 5:10 PM

## 2016-11-22 NOTE — [facility] Group Notes (Signed)
[facility] Group Notes:  (Nursing/MHT/Case Management/Adjunct)  Date:  11/22/2016  Time:  12:45 AM  Type of Therapy:  Group Therapy  Participation Level:  Minimal  Participation Quality:  Appropriate  Affect:  Appropriate  Cognitive:  Alert  Insight:  Good  Engagement in Group:  Supportive  Modes of Intervention:  Support  Summary of Progress/Problems:  Tony Cunningham 11/22/2016, 12:45 AM

## 2016-11-22 NOTE — Progress Notes (Signed)
Pt appeared 8 hours  to sleep while monitored on 15 minute safety checks.

## 2016-11-23 ENCOUNTER — Inpatient Hospital Stay: Payer: Medicare Other | Admitting: Anesthesiology

## 2016-11-23 ENCOUNTER — Inpatient Hospital Stay: Payer: Medicare Other

## 2016-11-23 ENCOUNTER — Encounter: Payer: Self-pay | Admitting: Anesthesiology

## 2016-11-23 LAB — GLUCOSE, CAPILLARY
GLUCOSE-CAPILLARY: 167 mg/dL — AB (ref 65–99)
GLUCOSE-CAPILLARY: 304 mg/dL — AB (ref 65–99)
GLUCOSE-CAPILLARY: 157 mg/dL — AB (ref 65–99)
Glucose-Capillary: 159 mg/dL — ABNORMAL HIGH (ref 65–99)
Glucose-Capillary: 278 mg/dL — ABNORMAL HIGH (ref 65–99)
Glucose-Capillary: 76 mg/dL (ref 65–99)

## 2016-11-23 LAB — CLOZAPINE (CLOZARIL)
Clozapine Lvl: 253 ng/mL — ABNORMAL LOW (ref 350–650)
NorClozapine: 162 ng/mL
Total(Cloz+Norcloz): 415 ng/mL

## 2016-11-23 MED ORDER — METHOHEXITAL SODIUM 100 MG/10ML IV SOSY
PREFILLED_SYRINGE | INTRAVENOUS | Status: DC | PRN
  Administered 2016-11-23: 80 mg via INTRAVENOUS

## 2016-11-23 MED ORDER — SODIUM CHLORIDE 0.9 % IV SOLN
500.0000 mL | Freq: Once | INTRAVENOUS | Status: AC
  Administered 2016-11-23: 500 mL via INTRAVENOUS

## 2016-11-23 MED ORDER — MIDAZOLAM HCL 5 MG/5ML IJ SOLN
INTRAMUSCULAR | Status: DC | PRN
  Administered 2016-11-23: 2 mg via INTRAVENOUS

## 2016-11-23 MED ORDER — FENTANYL CITRATE (PF) 100 MCG/2ML IJ SOLN: 25.0000 ug | INTRAMUSCULAR | Status: DC | PRN

## 2016-11-23 MED ORDER — MIDAZOLAM HCL 2 MG/2ML IJ SOLN
2.0000 mg | Freq: Once | INTRAMUSCULAR | Status: AC
  Administered 2016-11-23: 2 mg via INTRAVENOUS

## 2016-11-23 MED ORDER — LORAZEPAM 2 MG/ML IJ SOLN
2.0000 mg | Freq: Once | INTRAMUSCULAR | Status: AC | PRN
  Administered 2016-11-23: 2 mg via INTRAVENOUS

## 2016-11-23 MED ORDER — MIDAZOLAM HCL 2 MG/2ML IJ SOLN
INTRAMUSCULAR | Status: AC
Start: 2016-11-23 — End: 2016-11-23
  Filled 2016-11-23: qty 2

## 2016-11-23 MED ORDER — ONDANSETRON HCL 4 MG/2ML IJ SOLN: 4.0000 mg | Freq: Once | INTRAMUSCULAR | Status: DC | PRN

## 2016-11-23 MED ORDER — HALOPERIDOL LACTATE 5 MG/ML IJ SOLN
5.0000 mg | Freq: Once | INTRAMUSCULAR | Status: AC
  Administered 2016-11-23: 5 mg via INTRAVENOUS

## 2016-11-23 MED ORDER — SODIUM CHLORIDE 0.9 % IV SOLN
INTRAVENOUS | Status: DC | PRN
  Administered 2016-11-23: 11:00:00 via INTRAVENOUS

## 2016-11-23 MED ORDER — SUCCINYLCHOLINE CHLORIDE 20 MG/ML IJ SOLN
INTRAMUSCULAR | Status: DC | PRN
  Administered 2016-11-23: 100 mg via INTRAVENOUS

## 2016-11-23 NOTE — Progress Notes (Signed)
Recreation Therapy Notes  Date: 07.02.18 Time: 9:30 am Location: Craft Room  Group Topic: Self-esteem  Goal Area(s) Addresses:  Patient will effectively use art as a means of self-expression. Patient will recognize positive benefit of self-expression. Patient will be able to identify one emotion experienced during group.  Behavioral Response: Did not attend   Intervention: Two Face of Me  Activity: Patients were given a blank face worksheet and were instructed to draw a line down the middle. On one side of the worksheet, patients were instructed to draw or write how they felt when they were admitted and on the other side, they were instructed to draw or write how they felt when they were discharged.  Education: LRT educated patients on different forms of self-expression.  Education Outcome: Patient did not attend group.   Clinical Observations/Feedback: Patient did not attend group.  Jacquelynn Cree, LRT/CTRS 11/23/2016 10:24 AM

## 2016-11-23 NOTE — Progress Notes (Signed)
Dr. Toni Amend at bedside of patient.  Patrice also remains At bedside along with multiple staff.  Patient is calmer Restless still, resassured patient and reoriented Patient.

## 2016-11-23 NOTE — Progress Notes (Signed)
Dr. Toni Amend has review the patient's glucose level and was comfortable proceeding with the ECT treatment. The patient is alert and oriented and able to stand independently. Dr. Maisie Fus has been notified of Dr. Toni Amend decision.

## 2016-11-23 NOTE — Progress Notes (Signed)
Pt displays a slightly brighter affect displayed.  Improved attendance in unit activities.  Denies s/i, h/i or hallucinations.  MD ordered NPO for pt for tomorrow for ECT.  Pt monitored on 15 minute safety checks and maintained safety.

## 2016-11-23 NOTE — Anesthesia Post-op Follow-up Note (Cosign Needed)
Anesthesia QCDR form completed.        

## 2016-11-23 NOTE — Progress Notes (Signed)
Patient very agitated and restless, trying to get out Of bed, attempting to console patient and reassure Patient.

## 2016-11-23 NOTE — Plan of Care (Signed)
Problem: Activity: Goal: Will identify at least one activity in which they can participate Outcome: Not Progressing Pt isolative to room after ECT, does not participate

## 2016-11-23 NOTE — Anesthesia Postprocedure Evaluation (Signed)
Anesthesia Post Note  Patient: Tony Cunningham  Procedure(s) Performed: * No procedures listed *  Patient location during evaluation: PACU Anesthesia Type: General Level of consciousness: awake and alert Pain management: pain level controlled Vital Signs Assessment: post-procedure vital signs reviewed and stable Respiratory status: spontaneous breathing, nonlabored ventilation, respiratory function stable and patient connected to nasal cannula oxygen Cardiovascular status: blood pressure returned to baseline and stable Postop Assessment: no signs of nausea or vomiting Anesthetic complications: no     Last Vitals:  Vitals:   11/23/16 1226 11/23/16 1236  BP:    Pulse: 91   Resp: 20 (!) 22  Temp:  36.5 C    Last Pain:  Vitals:   11/23/16 1136  TempSrc:   PainSc: 0-No pain                 Dhanvi Boesen S

## 2016-11-23 NOTE — Plan of Care (Signed)
Problem: Activity: Goal: Will identify at least one activity in which they can participate Outcome: Not Progressing Not able to verbalize this  Problem: Coping: Goal: Ability to identify and develop effective coping behavior will improve Outcome: Progressing Not able to identify but is able to cope  Goal: Ability to interact with others will improve Outcome: Progressing Improve attendance at groups

## 2016-11-23 NOTE — Progress Notes (Signed)
[facility] MD Progress Note  11/23/2016 4:11 PM Tony Cunningham  MRN:  [id number]  Subjective:  Tony Cunningham is a 41 year old male with prior diagnosis of schizoaffective disorder. He received ECT to be continued next week if appropriate.  He was started on Clozapine and his dose was titrated to 225 mg. He is also on Luvox. Clozapine level was quite high with resulting sedation, because of the combination. Unfortunately in ERROR his Clozapine was discotoniued after Monday. I restarted 100 mg of Clozapine tonight. Actually, he is doing well without it, less sedated. Not knowing he was off Clozapine I took another level Thursday night.  11/08/16. The patient has been very isolative and withdrawn. Hygiene is poor and he is malodorous. He has not showered. He did go outside with the other patients and has been getting up for meals but not attending groups or interacting with staff or peers. He denies any current active or passive suicidal thoughts but does admit to some very vague paranoid thoughts that others are watching him. He is very limited and talking with this Probation officer and wanted to be "left alone to sleep". He denied any current somatic complaints. Vital signs are stable and he slept fairly well last night but is also sleeping partially during the daytime. So far, he is tolerating the Clozaril fairly well without any physical adverse side effects.  Follow-up for this 41 year old man with what appears to be psychotic depression or schizoaffective disorder. Patient remains in bed most of the time. Poorly interactive. Mood is depressed. Passive suicidal thoughts.  Follow-up for Thursday the 21st. Patient remains withdrawn very little activity. Blood sugars are also running much higher. Patient continues to endorse feeling depressed and having vague suicidal thoughts.  Follow-up for Friday the 22nd. Patient finally had ECT this morning bilateral treatment which went off without any difficulty or complication. I saw him  this afternoon and I think he actually was a little more interactive and verbal and energetic than before. This may be wishful thinking but it makes me optimistic. No specific complaints other than headache  Patient was seen today for follow-up. Patient has no new complaints. Continues to say his thoughts are racing but his affect is flat and withdrawn he is almost completely withdrawn from other people and stays in his room by himself. Suicidal thoughts passive no intention or plan. Still has psychotic symptoms. Blood sugars unfortunately continued to run quite high.  6/23 patient denies having any issues or concerns today. Says that he has been eating and sleeping well. Denies side effects from medications or any physical complaints. Denies suicidality or homicidality. He says that he might be hallucinating but he is not sure. He said that he had 1 ECT procedure and it went well.   Blood glucose this morning was 92. Patient is compliant with medications. No major events overnight per nursing.  6/24 patient did not sleep well last night. Other than that he denies having any issues or concerns. He denies suicidality, homicidality or auditory visual hallucinations. During examination he appears much brighter and more conversational. His thought processes are linear.  11/16/2016. Tony Cunningham does not like talking to me today. He is in his room he is in bed He does not open his eyes for me and is unable to keep a conversation he keeps telling me okay okay and is unwilling to answer any questions. He received just one ECT treatment last week. Since Dr. Weber Cooks is on vacation, ECT will resume next week.  He  accepts medications and seems to tolerate them well Clozaril level is over 700. This is probably from a combination of clozapine and Luvox. I will alert dose of clozapine to 200 mg  To avoid side effects. I will switch to Luvox to 1 nightly dose to improve sleep.  11/17/2016. Tony Cunningham is much more animated and  interactive today. He is in his room in bed bugs is able to hold a conversation and maintains good eye contact. He complains of stomach ache. He did have a bowel movement this morning. His mood is still depressed and he needs much encouragement to attend to his ADLs. Staff needs to strongly encourage showering. He has high hopes for ECT and will not stay in the hospital until next week to resume ECT with Dr. Weber Cooks Is back from vacation. Yesterday he is agreeable to the nighttime and clozapine dose decrease due to high clozapine level. This could have helped the patient stated more awake this morning. He slept almost 6 hours.  11/18/2016. Tony Cunningham seems more relaxed and talkative today. He is still very flat on his back but opens his eyes for me to we are having a small conversation. He has not been sleeping well. There are in spite of medication adjustments. He believes that trazodone works well for him. I will start trazodone tonight. She no longer complains of stomach pain. He does not participate in programming but takes medications with no problem. She met with the group home representative this week and was accepted to a group home with a bed open at the beginning of July. When asked returns from vacation, he will decide whether to continue ECT or whether the patient will be discharged.  11/19/2016. Tony Cunningham seems better today. This morning he has been up and walking around. He complains of "tension" in his head both mental and physical. He slept 3 hours only again in spite of multiple sedating medications given at night including Trazodone. I will switch him to Restoril tonight. It may need to be discontinued if her restarts ECT next week. No other somatic complaints. Sugers still elevated at times. I changed diet to ADA and discontinued Glucerna. Input from diabetes nurse coordinator and medicine consultant is greatly appreciated. He does not interact with staff/peers or participate in programing. Hygiene  is still questionable.  11/20/2016. Tony Cunningham is up and about today. He comes to my office for interview. He seems more relaxed today. Still very vague in his answers. Complains of back and neck pain but has been in bed on his back most of the week. His sugar was low today as he was switched to ADA diet and Glucerna was discontinued upon [facility] suggestion. He is looking forward to see Dr. Weber Cooks "who is the best". He corrects himself to tell me that I am not bad either.  Follow-up for June 30. Patient seen. Spoke with nursing. Patient continues to report feeling bad. Mood feels depressed. Denies acute suicidal thoughts but feels hopeless about the future. Feels paranoid and vaguely report psychosis. Patient continues to have poor self-care and poor interaction with others on the unit. Physically he seems to be stabilizing a little.  Follow-up Sunday, July 1. Patient seen. He looks a little better groomed today. He has been up out of his bed although he does not attend any groups. He is ambivalent about his mood today. Denies acute hallucinations or suicidal thinking. Still feels hopeless about discharge.  Patient seen for follow-up on Monday, July 2. He  had ECT treatment this morning which was complicated by some delirium and agitation during the recovery phase. Otherwise treatment went fine. Seen this afternoon the patient is still a little groggy from the medicines he received. No specific physical complaints however. Mood is still depressed and he is still slow and cognitively impaired. Denies acute suicidal intent. Sugars continue to be very labile. He is requesting a restart of his Glucerna but I am unsure whether that is appropriate.  Per nursing: Pt calm, cooperative and pleasant.  AM BS 149.  Speech is more clear and logical, though remains hypoverbal.  Complains of muscle pain in neck starting approx. 1 week ago.  PRN acetaminophen offered and eventually accepted.  Reports moderate relief.  Remains  frequently isolative to room.  No behavioral issues.    Principal Problem: Schizoaffective disorder, depressive type ([place]) Diagnosis:   Patient Active Problem List   Diagnosis Date Noted  . Schizoaffective disorder, depressive type ([place]) [F25.1] 09/23/2016  . Polysubstance abuse [F19.10] 07/17/2016  . Tardive dyskinesia [G24.01] 07/16/2016  . Tobacco use disorder [F17.200] 07/07/2016  . Dyslipidemia [E78.5] 07/07/2016  . Asthma [J45.909] 07/07/2016  . HTN (hypertension) [I10] 07/06/2016  . Diabetes ([place]) [E11.9] 12/25/2010   Total Time spent with patient: 30 minutes    Past Medical History:  Past Medical History:  Diagnosis Date  . Anxiety   . Asthma   . Diabetes mellitus   . High blood pressure   . Sinus complaint    History reviewed. No pertinent surgical history.  Social History:  History  Alcohol Use No     History  Drug Use No    Social History   Social History  . Marital status: Single    Spouse name: [name]  . Number of children: [name]  . Years of education: [name]   Social History Main Topics  . Smoking status: Current Every Day Smoker    Packs/day: 0.50    Types: Cigarettes  . Smokeless tobacco: Never Used  . Alcohol use No  . Drug use: No  . Sexual activity: Not Currently   Other Topics Concern  . None   Social History Narrative  . None   Additional Social History:                         Sleep: Good  Appetite:  Good  Current Medications: Current Facility-Administered Medications  Medication Dose Route Frequency Provider Last Rate Last Dose  . acetaminophen (TYLENOL) tablet 650 mg  650 mg Oral Q6H PRN Wilburn Keir, Madie Reno, MD   650 mg at 11/19/16 1739  . alum & mag hydroxide-simeth (MAALOX/MYLANTA) 200-200-20 MG/5ML suspension 30 mL  30 mL Oral Q4H PRN Reyna Lorenzi T, MD      . cloZAPine (CLOZARIL) tablet 100 mg  100 mg Oral QHS [name], [name], MD   100 mg at 11/22/16 2141  . dextrose 5 %-0.45 % sodium chloride infusion    Intravenous Continuous June Rode, Madie Reno, MD 10 mL/hr at 11/13/16 1009    . fentaNYL (SUBLIMAZE) injection 25 mcg  25 mcg Intravenous Q5 min PRN Gunnar Bulla, MD      . fluvoxaMINE (LUVOX) tablet 100 mg  100 mg Oral QHS [name], [name], MD   100 mg at 11/22/16 2141  . insulin aspart (novoLOG) injection 0-15 Units  0-15 Units Subcutaneous TID WC Loletha Grayer, MD   3 Units at 11/23/16 1435  . insulin aspart (novoLOG) injection 0-5 Units  0-5  Units Subcutaneous QHS Loletha Grayer, MD   4 Units at 11/18/16 2158  . insulin aspart (novoLOG) injection 10 Units  10 Units Subcutaneous TID WC Fritzi Mandes, MD   10 Units at 11/23/16 1442  . insulin glargine (LANTUS) injection 55 Units  55 Units Subcutaneous QHS Fritzi Mandes, MD   55 Units at 11/22/16 2142  . lisinopril (PRINIVIL,ZESTRIL) tablet 20 mg  20 mg Oral Daily Armondo Cech, Madie Reno, MD   20 mg at 11/23/16 8182  . magnesium hydroxide (MILK OF MAGNESIA) suspension 30 mL  30 mL Oral Daily PRN An Schnabel T, MD      . meloxicam (MOBIC) tablet 7.5 mg  7.5 mg Oral BID PC Terease Marcotte T, MD   7.5 mg at 11/23/16 1435  . metFORMIN (GLUCOPHAGE) tablet 1,000 mg  1,000 mg Oral BID WC Jaylee Freeze T, MD   1,000 mg at 11/23/16 1435  . montelukast (SINGULAIR) tablet 10 mg  10 mg Oral QHS Brizeida Mcmurry, Madie Reno, MD   10 mg at 11/22/16 2141  . nicotine (NICODERM CQ - dosed in mg/24 hr) patch 7 mg  7 mg Transdermal Daily Chauncey Mann, MD   7 mg at 11/21/16 0752  . ondansetron (ZOFRAN) injection 4 mg  4 mg Intravenous Once PRN Gunnar Bulla, MD      . pantoprazole (PROTONIX) EC tablet 40 mg  40 mg Oral Daily [name], [name], MD   40 mg at 11/23/16 1435  . polyethylene glycol (MIRALAX / GLYCOLAX) packet 17 g  17 g Oral Daily Erasmus Bistline, Madie Reno, MD   17 g at 11/21/16 9937  . promethazine (PHENERGAN) tablet 12.5 mg  12.5 mg Oral Q6H PRN Huda Petrey T, MD      . propranolol (INDERAL) tablet 20 mg  20 mg Oral BID Morgan Keinath, Madie Reno, MD   20 mg at 11/23/16 1435  .  simvastatin (ZOCOR) tablet 40 mg  40 mg Oral q1800 Channel Papandrea, Madie Reno, MD   40 mg at 11/22/16 2000  . temazepam (RESTORIL) capsule 15 mg  15 mg Oral QHS [name], [name], MD   15 mg at 11/22/16 2142    Lab Results:  Results for orders placed or performed during the hospital encounter of 11/06/16 (from the past 48 hour(s))  Glucose, capillary     Status: Abnormal   Collection Time: 11/21/16  4:32 PM  Result Value Ref Range   Glucose-Capillary 183 (H) 65 - 99 mg/dL   Comment 1 Notify RN   Glucose, capillary     Status: Abnormal   Collection Time: 11/21/16  8:42 PM  Result Value Ref Range   Glucose-Capillary 136 (H) 65 - 99 mg/dL   Comment 1 Notify RN   Glucose, capillary     Status: Abnormal   Collection Time: 11/22/16  6:28 AM  Result Value Ref Range   Glucose-Capillary 136 (H) 65 - 99 mg/dL   Comment 1 Notify RN   Glucose, capillary     Status: Abnormal   Collection Time: 11/22/16 11:19 AM  Result Value Ref Range   Glucose-Capillary 188 (H) 65 - 99 mg/dL  Glucose, capillary     Status: Abnormal   Collection Time: 11/22/16  4:32 PM  Result Value Ref Range   Glucose-Capillary 133 (H) 65 - 99 mg/dL  Glucose, capillary     Status: Abnormal   Collection Time: 11/22/16  9:41 PM  Result Value Ref Range   Glucose-Capillary 167 (H) 65 - 99 mg/dL  Glucose, capillary  Status: Abnormal   Collection Time: 11/23/16  6:46 AM  Result Value Ref Range   Glucose-Capillary 304 (H) 65 - 99 mg/dL  Glucose, capillary     Status: Abnormal   Collection Time: 11/23/16 12:12 PM  Result Value Ref Range   Glucose-Capillary 157 (H) 65 - 99 mg/dL  Glucose, capillary     Status: Abnormal   Collection Time: 11/23/16  2:10 PM  Result Value Ref Range   Glucose-Capillary 159 (H) 65 - 99 mg/dL    Blood Alcohol level:  Lab Results  Component Value Date   ETH <5 10/21/2016   ETH <5 08/13/2246    Metabolic Disorder Labs: Lab Results  Component Value Date   HGBA1C 10.3 (H) 11/09/2016   MPG 249  11/09/2016   MPG 275 10/09/2016   Lab Results  Component Value Date   PROLACTIN 24.5 (H) 09/24/2016   PROLACTIN 3.4 (L) 07/07/2016   Lab Results  Component Value Date   CHOL 92 11/09/2016   TRIG 54 11/09/2016   HDL 39 (L) 11/09/2016   CHOLHDL 2.4 11/09/2016   VLDL 11 11/09/2016   LDLCALC 42 11/09/2016   LDLCALC 72 10/09/2016    Physical Findings: AIMS: Facial and Oral Movements Muscles of Facial Expression: Mild Lips and Perioral Area: Mild Jaw: Mild Tongue: Minimal,Extremity Movements Upper (arms, wrists, hands, fingers): None, normal Lower (legs, knees, ankles, toes): Minimal, Trunk Movements Neck, shoulders, hips: None, normal, Overall Severity Severity of abnormal movements (highest score from questions above): Mild Incapacitation due to abnormal movements: Minimal Patient's awareness of abnormal movements (rate only patient's report): No Awareness, Dental Status Current problems with teeth and/or dentures?: No Does patient usually wear dentures?: No   Musculoskeletal: Strength & Muscle Tone: within normal limits Gait & Station: normal Patient leans: [name]  Psychiatric Specialty Exam: Physical Exam  Nursing note and vitals reviewed. Constitutional: He appears well-developed and well-nourished.  HENT:  Head: Normocephalic and atraumatic.  Eyes: Conjunctivae are normal. Pupils are equal, round, and reactive to light.  Neck: Normal range of motion.  Cardiovascular: Regular rhythm and normal heart sounds.   Respiratory: Effort normal.  GI: Soft.  Musculoskeletal: Normal range of motion.  Neurological: He is alert.  Skin: Skin is warm and dry.  Psychiatric: His affect is blunt. His speech is delayed. He is slowed. Thought content is paranoid. Thought content is not delusional. Cognition and memory are normal. He expresses impulsivity.    Review of Systems  Constitutional: Negative.   HENT: Negative.   Eyes: Negative.   Cardiovascular: Negative.    Gastrointestinal: Negative.   Genitourinary: Negative.   Musculoskeletal: Negative.   Skin: Negative.   Neurological: Negative.   Endo/Heme/Allergies: Negative.   Psychiatric/Behavioral: Positive for depression and hallucinations.    Blood pressure 108/64, pulse 91, temperature 97.7 F (36.5 C), resp. rate (!) 22, height _0  (1.727 m), weight 78.9 kg (174 lb), SpO2 97 %.Body mass index is 26.46 kg/m.  General Appearance: Disheveled  Eye Contact:  Poor  Speech:  Slow  Volume:  Decreased  Mood:  Dysphoric  Affect:  Blunt  Thought Process:  Linear  Orientation:  Full (Time, Place, and Person)  Thought Content:  Paranoid Ideation  Suicidal Thoughts:  No  Homicidal Thoughts:  No  Memory:  Immediate;   Fair Recent;   Fair Remote;   Fair  Judgement:  Impaired  Insight:  Lacking  Psychomotor Activity:  Decreased  Concentration:  Concentration: Fair and Attention Span: Fair  Recall:  Fair  Fund of Knowledge:  Fair  Language:  Good  Akathisia:  No  Handed:  Right  AIMS (if indicated):     Assets:  Housing Physical Health  ADL's:  Intact  Cognition:  WNL  Sleep:  Number of Hours: 8     Treatment Plan Summary:  Tony Cunningham 41 year old male with prior diagnosis of schizoaffective disorder who presented to the emergency room with vague suicidal thoughts as well as paranoid thoughts. He does. Be responding to internal stimuli and has endorsed some auditory and visual hallucinations. He will be admitted to inpatient psychiatry for medication management, safety and stabilization.  1. Schizoaffective disorder, depressive type. He was started on Clozaril and his dose was increased to 225 mg nightly for psychosis. Clozapine level 748 most likely due to interaction with the Luvox. I tried to lower Clozapine dose but in ERROR we discontinue Clozapine altogether. Last dose was given on Monday. I ordered Clozapine level on Friday unnecessarily. I restarted 100 mg of Clozapine tonight.    2. ECT. Patient had ECT on Friday,11/13/2016 for the first time in this hospitalization. I am optimistic that this treatment may make a real difference. Unfortunately I will be away from the hospital all of next week. I am leaving the patient in the care of Dr. Mamie Nick and will sign out to her. If the patient is still in the hospital after this next week we will plan to continue ECT treatment picking up again on July 2.   3. Metabolic syndrome monitoring. Lipid panel is normal, hemoglobin A1c 103.  4. EKG. Normal sinus rhythm, QTc 417.  5. Questionable history of polysubstance abuse: The patient denies any history of any polysubstance use but records indicate that there are may have been a history of polysubstance use. He was advised to abstain from alcohol and all illicit drugs as it may worsen anxiety mood symptoms as well as psychosis. The patient is not interested in any substance abuse treatment.  6. Nicotine use disorder. Nicotine patch is available.   7. Diabetes. He is on Metformin, Novolog 10 units with meals and Lantus 55 units, along with ADA diet and blood sugar monitoring. Medicine input is greatly appreciated. Glucerna is discontinued.  8. Hypertension: Vital signs are stable. We'll continue lisinopril 20 mg daily.  9. Seasonal allergies: Continue Singulair 10 mg nightly  10. Anxiety/restlessness. He is on Propranolol.   11. Insomnia. We discontinued Trazodone and started Restoril. Slept 7.5 hours.  12. Back pain. We will give Robaxin.  13. Disposition: The patient will return to a group home. He will follow up with [facility] in [facility]. Consider ACT team.  ECT today. Because of the holiday the next treatment will be on Friday. Patient will continue tentatively with ECT on Friday meanwhile continuing current medicine including antipsychotic and antidepressant. Continue to monitor blood sugars.    Alethia Berthold, MD 11/23/2016, 4:11 PM

## 2016-11-23 NOTE — Anesthesia Preprocedure Evaluation (Signed)
Anesthesia Evaluation  Patient identified by MRN, date of birth, ID band Patient awake    Reviewed: Allergy & Precautions, NPO status , Patient's Chart, lab work & pertinent test results, reviewed documented beta blocker date and time   Airway Mallampati: II  TM Distance: >3 FB     Dental  (+) Chipped   Pulmonary asthma , Current Smoker,           Cardiovascular hypertension, Pt. on medications and Pt. on home beta blockers      Neuro/Psych PSYCHIATRIC DISORDERS Anxiety Depression Schizophrenia    GI/Hepatic   Endo/Other  diabetes, Type 2  Renal/GU      Musculoskeletal   Abdominal   Peds  Hematology   Anesthesia Other Findings   Reproductive/Obstetrics                             Anesthesia Physical Anesthesia Plan  ASA: III  Anesthesia Plan: General   Post-op Pain Management:    Induction: Intravenous  PONV Risk Score and Plan:   Airway Management Planned:   Additional Equipment:   Intra-op Plan:   Post-operative Plan:   Informed Consent: I have reviewed the patients History and Physical, chart, labs and discussed the procedure including the risks, benefits and alternatives for the proposed anesthesia with the patient or authorized representative who has indicated his/her understanding and acceptance.     Plan Discussed with: CRNA  Anesthesia Plan Comments:         Anesthesia Quick Evaluation

## 2016-11-23 NOTE — Progress Notes (Signed)
Versed given as ordered and will notify dr clapacs. Patient remains restless and difficult to keep in the Bed.

## 2016-11-23 NOTE — [facility] Group Notes (Signed)
[facility] Group Notes:  (Nursing/MHT/Case Management/Adjunct)  Date:  11/23/2016  Time:  12:20 AM  Type of Therapy:  Evening Wrap-up Group  Participation Level:  Did Not Attend  Participation Quality:  [name]  Affect:  [name]  Cognitive:  [name]  Insight:  None  Engagement in Group:  Did Not Attend  Modes of Intervention:  Activity  Summary of Progress/Problems:  Tony Cunningham 11/23/2016, 12:20 AM

## 2016-11-23 NOTE — Progress Notes (Signed)
Patient assited into wheelchair, calmer and more Cooperative, taken to behavorial medicine with Financial controller.

## 2016-11-23 NOTE — Transfer of Care (Signed)
Immediate Anesthesia Transfer of Care Note  Patient: Tony Cunningham  Procedure(s) Performed: ect  Patient Location: PACU  Anesthesia Type:General  Level of Consciousness: awake  Airway & Oxygen Therapy: Patient Spontanous Breathing and Patient connected to face mask oxygen  Post-op Assessment: Report given to RN and Post -op Vital signs reviewed and stable  Post vital signs: Reviewed  Last Vitals:  Vitals:   11/23/16 0955 11/23/16 1138  BP: 127/81 (!) 149/97  Pulse: 64 92  Resp: 18 20  Temp: 36.9 C 36.1 C    Last Pain:  Vitals:   11/23/16 1000  TempSrc:   PainSc: 6          Complications: No apparent anesthesia complications

## 2016-11-23 NOTE — Progress Notes (Signed)
Tony Cunningham with ECT RN at bedside of patient, more meds ordered and to be given.

## 2016-11-23 NOTE — H&P (Signed)
Tony Cunningham is an 41 y.o. male.   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 Anxiety   . Asthma   . Diabetes mellitus   . High blood pressure   . Sinus complaint     History reviewed. No pertinent surgical history.  History reviewed. No pertinent family history. Social History:  reports that he has been smoking Cigarettes.  He has been smoking about 0.50 packs per day. He has never used smokeless tobacco. He reports that he does not drink alcohol or use drugs.  Allergies: No Known Allergies  Medications Prior to Admission  Medication Sig Dispense Refill  . cloZAPine (CLOZARIL) 100 MG tablet Take 1 tablet (100 mg total) by mouth daily. Daily at 1700 30 tablet 0  . cyclobenzaprine (FLEXERIL) 10 MG tablet Take 1 tablet (10 mg total) by mouth 3 (three) times daily as needed for muscle spasms. 60 tablet 0  . fluvoxaMINE (LUVOX) 50 MG tablet Take 1 tablet (50 mg total) by mouth daily. Daily at 1700 30 tablet 0  . insulin aspart (NOVOLOG) 100 UNIT/ML injection Inject 5 Units into the skin 3 (three) times daily with meals. 5 mL 0  . insulin glargine (LANTUS) 100 UNIT/ML injection Inject 0.42 mLs (42 Units total) into the skin at bedtime. 13 mL 0  . ipratropium (ATROVENT) 0.06 % nasal spray Place 1 spray into both nostrils daily after supper. 3 mL 0  . lidocaine (LIDODERM) 5 % Place 1 patch onto the skin daily. Remove & Discard patch within 12 hours or as directed by MD 30 patch 0  . lisinopril (PRINIVIL,ZESTRIL) 20 MG tablet Take 1 tablet (20 mg total) by mouth daily. 30 tablet 0  . meloxicam (MOBIC) 7.5 MG tablet Take 1 tablet (7.5 mg total) by mouth 2 (two) times daily. 60 tablet 0  . metFORMIN (GLUCOPHAGE) 1000 MG tablet Take 1 tablet (1,000 mg total) by mouth 2 (two) times daily with a meal. 60 tablet 0  . montelukast (SINGULAIR) 10 MG  tablet Take 1 tablet (10 mg total) by mouth at bedtime. For Asthma 30 tablet 0  . polyethylene glycol (MIRALAX / GLYCOLAX) packet Take 17 g by mouth daily. 30 each 0  . propranolol (INDERAL) 20 MG tablet Take 1 tablet (20 mg total) by mouth 2 (two) times daily. 60 tablet 0  . simvastatin (ZOCOR) 40 MG tablet Take 1 tablet (40 mg total) by mouth at bedtime. For high cholesterol 15 tablet 0    Results for orders placed or performed during the hospital encounter of 11/06/16 (from the past 48 hour(s))  Glucose, capillary     Status: Abnormal   Collection Time: 11/21/16 11:34 AM  Result Value Ref Range   Glucose-Capillary 121 (H) 65 - 99 mg/dL  Glucose, capillary     Status: Abnormal   Collection Time: 11/21/16  4:32 PM  Result Value Ref Range   Glucose-Capillary 183 (H) 65 - 99 mg/dL   Comment 1 Notify RN   Glucose, capillary     Status: Abnormal   Collection Time: 11/21/16  8:42 PM  Result Value Ref Range   Glucose-Capillary 136 (H) 65 - 99 mg/dL   Comment 1 Notify RN   Glucose, capillary     Status: Abnormal   Collection Time: 11/22/16  6:28 AM  Result Value Ref Range   Glucose-Capillary 136 (H) 65 - 99 mg/dL  Comment 1 Notify RN   Glucose, capillary     Status: Abnormal   Collection Time: 11/22/16 11:19 AM  Result Value Ref Range   Glucose-Capillary 188 (H) 65 - 99 mg/dL  Glucose, capillary     Status: Abnormal   Collection Time: 11/22/16  4:32 PM  Result Value Ref Range   Glucose-Capillary 133 (H) 65 - 99 mg/dL  Glucose, capillary     Status: Abnormal   Collection Time: 11/22/16  9:41 PM  Result Value Ref Range   Glucose-Capillary 167 (H) 65 - 99 mg/dL  Glucose, capillary     Status: Abnormal   Collection Time: 11/23/16  6:46 AM  Result Value Ref Range   Glucose-Capillary 304 (H) 65 - 99 mg/dL   No results found.  Review of Systems  Constitutional: Negative.   HENT: Negative.   Eyes: Negative.   Respiratory: Negative.   Cardiovascular: Negative.   Gastrointestinal:  Negative.   Musculoskeletal: Negative.   Skin: Negative.   Neurological: Negative.   Psychiatric/Behavioral: Positive for depression, memory loss and suicidal ideas. Negative for hallucinations and substance abuse. The patient is nervous/anxious and has insomnia.     Blood pressure 127/81, pulse 64, temperature 98.4 F (36.9 C), temperature source Oral, resp. rate 18, height 5\' 8"  (1.727 m), weight 78.9 kg (174 lb), SpO2 97 %. Physical Exam  Nursing note and vitals reviewed. Constitutional: He appears well-developed and well-nourished.  HENT:  Head: Normocephalic and atraumatic.  Eyes: Conjunctivae are normal. Pupils are equal, round, and reactive to light.  Neck: Normal range of motion.  Cardiovascular: Regular rhythm and normal heart sounds.   Respiratory: Effort normal. No respiratory distress.  GI: Soft.  Musculoskeletal: Normal range of motion.  Neurological: He is alert.  Skin: Skin is warm and dry.  Psychiatric: His affect is blunt. His speech is delayed. He is slowed and withdrawn. Cognition and memory are impaired. He expresses inappropriate judgment. He expresses suicidal ideation. He expresses no suicidal plans. He exhibits abnormal recent memory and abnormal remote memory.     Assessment/Plan Patient will be receiving his second ECT treatment of this cycle today continue inpatient treatment for now any current medicine.  , MD 11/23/2016, 11:19 AM

## 2016-11-23 NOTE — Progress Notes (Signed)
Pt remained in room this morning prior to ECT.  Pt woken to recheck VS prior to giving Propanolol, see flowsheets.  ECT RN notified of BP, will hold propanolol.   Transported to ECT.  ECT RN reports pt became agitated prior to procedure, but will transport back to BMU when pt becomes more calm.  Upon arrival to unit, pt is calm and cooperative, reports he is tired.  VS and CBG taken, Dr. Toni Amend notified of results, instructed to give morning meds and 1700 meds as scheduled.  Lunch accepted.  Confused - asks when he will be going to ECT.  Reoriented.  Currently resting comfortably.  Pt remained isolative to room for much of shift.  Awakes easily, no acute distress.

## 2016-11-23 NOTE — Procedures (Signed)
ECT SERVICES Physician's Interval Evaluation & Treatment Note  Patient Identification: Tony Cunningham MRN:  [id number] Date of Evaluation:  11/23/2016 TX #: [id number]  MADRS:   MMSE:   P.E. Findings:  No change to physical exam. Blood sugar this morning was over 300 on morning check. Patient is asymptomatic as far as sugar  Psychiatric Interval Note:  Mood continues to be depressed withdrawn and anxious  Subjective:  Patient is a 41 y.o. male seen for evaluation for Electroconvulsive Therapy. No specific complaint  Treatment Summary:   []   Right Unilateral             [x]  Bilateral   % Energy : 1.0 ms 60%   Impedance: 1490 ohms  Seizure Energy Index: 16,767 V squared  Postictal Suppression Index: 90%  Seizure Concordance Index: 94%  Medications  Pre Shock: Brevital 80 mg succinylcholine 100 mg  Post Shock: Versed 2 mg  Seizure Duration: 32 seconds by EMG 32 seconds by EEG   Comments: Follow-up Friday   Lungs:  [x]   Clear to auscultation               []  Other:   Heart:    [x]   Regular rhythm             []  irregular rhythm    [x]   Previous H&P reviewed, patient examined and there are NO CHANGES                 []   Previous H&P reviewed, patient examined and there are changes noted.   , MD 7/2/201811:20 AM

## 2016-11-24 LAB — CBC WITH DIFFERENTIAL/PLATELET
BASOS PCT: 1 %
Basophils Absolute: 0.1 10*3/uL (ref 0–0.1)
EOS ABS: 0.5 10*3/uL (ref 0–0.7)
EOS PCT: 5 %
HCT: 36.8 % — ABNORMAL LOW (ref 40.0–52.0)
Hemoglobin: 13 g/dL (ref 13.0–18.0)
LYMPHS ABS: 3.1 10*3/uL (ref 1.0–3.6)
Lymphocytes Relative: 37 %
MCH: 30.9 pg (ref 26.0–34.0)
MCHC: 35.4 g/dL (ref 32.0–36.0)
MCV: 87.3 fL (ref 80.0–100.0)
MONOS PCT: 6 %
Monocytes Absolute: 0.5 10*3/uL (ref 0.2–1.0)
Neutro Abs: 4.3 10*3/uL (ref 1.4–6.5)
Neutrophils Relative %: 51 %
PLATELETS: 184 10*3/uL (ref 150–440)
RBC: 4.22 MIL/uL — ABNORMAL LOW (ref 4.40–5.90)
RDW: 13 % (ref 11.5–14.5)
WBC: 8.4 10*3/uL (ref 3.8–10.6)

## 2016-11-24 LAB — GLUCOSE, CAPILLARY
GLUCOSE-CAPILLARY: 179 mg/dL — AB (ref 65–99)
GLUCOSE-CAPILLARY: 128 mg/dL — AB (ref 65–99)
Glucose-Capillary: 192 mg/dL — ABNORMAL HIGH (ref 65–99)
Glucose-Capillary: 287 mg/dL — ABNORMAL HIGH (ref 65–99)
Glucose-Capillary: 309 mg/dL — ABNORMAL HIGH (ref 65–99)

## 2016-11-24 NOTE — Progress Notes (Signed)
[facility] MD Progress Note  11/24/2016 7:12 PM Baily Serpe  MRN:  [id number]  Subjective:  Mr. Bille is a 41 year old male with prior diagnosis of schizoaffective disorder. He received ECT to be continued next week if appropriate.  He was started on Clozapine and his dose was titrated to 225 mg. He is also on Luvox. Clozapine level was quite high with resulting sedation, because of the combination. Unfortunately in ERROR his Clozapine was discotoniued after Monday. I restarted 100 mg of Clozapine tonight. Actually, he is doing well without it, less sedated. Not knowing he was off Clozapine I took another level Thursday night.  11/08/16. The patient has been very isolative and withdrawn. Hygiene is poor and he is malodorous. He has not showered. He did go outside with the other patients and has been getting up for meals but not attending groups or interacting with staff or peers. He denies any current active or passive suicidal thoughts but does admit to some very vague paranoid thoughts that others are watching him. He is very limited and talking with this Probation officer and wanted to be "left alone to sleep". He denied any current somatic complaints. Vital signs are stable and he slept fairly well last night but is also sleeping partially during the daytime. So far, he is tolerating the Clozaril fairly well without any physical adverse side effects.  Follow-up for this 41 year old man with what appears to be psychotic depression or schizoaffective disorder. Patient remains in bed most of the time. Poorly interactive. Mood is depressed. Passive suicidal thoughts.  Follow-up for Thursday the 21st. Patient remains withdrawn very little activity. Blood sugars are also running much higher. Patient continues to endorse feeling depressed and having vague suicidal thoughts.  Follow-up for Friday the 22nd. Patient finally had ECT this morning bilateral treatment which went off without any difficulty or complication. I saw him  this afternoon and I think he actually was a little more interactive and verbal and energetic than before. This may be wishful thinking but it makes me optimistic. No specific complaints other than headache  Patient was seen today for follow-up. Patient has no new complaints. Continues to say his thoughts are racing but his affect is flat and withdrawn he is almost completely withdrawn from other people and stays in his room by himself. Suicidal thoughts passive no intention or plan. Still has psychotic symptoms. Blood sugars unfortunately continued to run quite high.  6/23 patient denies having any issues or concerns today. Says that he has been eating and sleeping well. Denies side effects from medications or any physical complaints. Denies suicidality or homicidality. He says that he might be hallucinating but he is not sure. He said that he had 1 ECT procedure and it went well.   Blood glucose this morning was 92. Patient is compliant with medications. No major events overnight per nursing.  6/24 patient did not sleep well last night. Other than that he denies having any issues or concerns. He denies suicidality, homicidality or auditory visual hallucinations. During examination he appears much brighter and more conversational. His thought processes are linear.  11/16/2016. Mr. Rivero does not like talking to me today. He is in his room he is in bed He does not open his eyes for me and is unable to keep a conversation he keeps telling me okay okay and is unwilling to answer any questions. He received just one ECT treatment last week. Since Dr. Weber Cooks is on vacation, ECT will resume next week.  He  accepts medications and seems to tolerate them well Clozaril level is over 700. This is probably from a combination of clozapine and Luvox. I will alert dose of clozapine to 200 mg  To avoid side effects. I will switch to Luvox to 1 nightly dose to improve sleep.  11/17/2016. [name] is much more animated and  interactive today. He is in his room in bed bugs is able to hold a conversation and maintains good eye contact. He complains of stomach ache. He did have a bowel movement this morning. His mood is still depressed and he needs much encouragement to attend to his ADLs. Staff needs to strongly encourage showering. He has high hopes for ECT and will not stay in the hospital until next week to resume ECT with Dr. Weber Cooks Is back from vacation. Yesterday he is agreeable to the nighttime and clozapine dose decrease due to high clozapine level. This could have helped the patient stated more awake this morning. He slept almost 6 hours.  11/18/2016. [name] seems more relaxed and talkative today. He is still very flat on his back but opens his eyes for me to we are having a small conversation. He has not been sleeping well. There are in spite of medication adjustments. He believes that trazodone works well for him. I will start trazodone tonight. She no longer complains of stomach pain. He does not participate in programming but takes medications with no problem. She met with the group home representative this week and was accepted to a group home with a bed open at the beginning of July. When asked returns from vacation, he will decide whether to continue ECT or whether the patient will be discharged.  11/19/2016. [name] seems better today. This morning he has been up and walking around. He complains of "tension" in his head both mental and physical. He slept 3 hours only again in spite of multiple sedating medications given at night including Trazodone. I will switch him to Restoril tonight. It may need to be discontinued if her restarts ECT next week. No other somatic complaints. Sugers still elevated at times. I changed diet to ADA and discontinued Glucerna. Input from diabetes nurse coordinator and medicine consultant is greatly appreciated. He does not interact with staff/peers or participate in programing. Hygiene  is still questionable.  11/20/2016. [name] is up and about today. He comes to my office for interview. He seems more relaxed today. Still very vague in his answers. Complains of back and neck pain but has been in bed on his back most of the week. His sugar was low today as he was switched to ADA diet and Glucerna was discontinued upon [facility] suggestion. He is looking forward to see Dr. Weber Cooks "who is the best". He corrects himself to tell me that I am not bad either.  Follow-up for June 30. Patient seen. Spoke with nursing. Patient continues to report feeling bad. Mood feels depressed. Denies acute suicidal thoughts but feels hopeless about the future. Feels paranoid and vaguely report psychosis. Patient continues to have poor self-care and poor interaction with others on the unit. Physically he seems to be stabilizing a little.  Follow-up Sunday, July 1. Patient seen. He looks a little better groomed today. He has been up out of his bed although he does not attend any groups. He is ambivalent about his mood today. Denies acute hallucinations or suicidal thinking. Still feels hopeless about discharge.  Patient seen for follow-up on Monday, July 2. He  had ECT treatment this morning which was complicated by some delirium and agitation during the recovery phase. Otherwise treatment went fine. Seen this afternoon the patient is still a little groggy from the medicines he received. No specific physical complaints however. Mood is still depressed and he is still slow and cognitively impaired. Denies acute suicidal intent. Sugars continue to be very labile. He is requesting a restart of his Glucerna but I am unsure whether that is appropriate.  [place] Tuesday, July 3. Patient has no new complaints. In fact today he seems to think he is doing a little bit better. His affect looked brighter and he was more conversant with me. He is taking some steps to improve his appearance.  Per nursing: Pt calm, cooperative and  pleasant.  AM BS 149.  Speech is more clear and logical, though remains hypoverbal.  Complains of muscle pain in neck starting approx. 1 week ago.  PRN acetaminophen offered and eventually accepted.  Reports moderate relief.  Remains frequently isolative to room.  No behavioral issues.    Principal Problem: Schizoaffective disorder, depressive type ([place]) Diagnosis:   Patient Active Problem List   Diagnosis Date Noted  . Schizoaffective disorder, depressive type ([place]) [F25.1] 09/23/2016  . Polysubstance abuse [F19.10] 07/17/2016  . Tardive dyskinesia [G24.01] 07/16/2016  . Tobacco use disorder [F17.200] 07/07/2016  . Dyslipidemia [E78.5] 07/07/2016  . Asthma [J45.909] 07/07/2016  . HTN (hypertension) [I10] 07/06/2016  . Diabetes ([place]) [E11.9] 12/25/2010   Total Time spent with patient: 30 minutes    Past Medical History:  Past Medical History:  Diagnosis Date  . Anxiety   . Asthma   . Diabetes mellitus   . High blood pressure   . Sinus complaint    History reviewed. No pertinent surgical history.  Social History:  History  Alcohol Use No     History  Drug Use No    Social History   Social History  . Marital status: Single    Spouse name: [name]  . Number of children: [name]  . Years of education: [name]   Social History Main Topics  . Smoking status: Current Every Day Smoker    Packs/day: 0.50    Types: Cigarettes  . Smokeless tobacco: Never Used  . Alcohol use No  . Drug use: No  . Sexual activity: Not Currently   Other Topics Concern  . None   Social History Narrative  . None   Additional Social History:                         Sleep: Good  Appetite:  Good  Current Medications: Current Facility-Administered Medications  Medication Dose Route Frequency Provider Last Rate Last Dose  . acetaminophen (TYLENOL) tablet 650 mg  650 mg Oral Q6H PRN Sirena Riddle, Madie Reno, MD   650 mg at 11/19/16 1739  . alum & mag hydroxide-simeth (MAALOX/MYLANTA) 200-200-20  MG/5ML suspension 30 mL  30 mL Oral Q4H PRN Merrel Crabbe T, MD      . cloZAPine (CLOZARIL) tablet 100 mg  100 mg Oral QHS [name], [name], MD   100 mg at 11/23/16 2112  . dextrose 5 %-0.45 % sodium chloride infusion   Intravenous Continuous Stachia Slutsky, Madie Reno, MD 10 mL/hr at 11/13/16 1009    . fentaNYL (SUBLIMAZE) injection 25 mcg  25 mcg Intravenous Q5 min PRN Gunnar Bulla, MD      . fluvoxaMINE (LUVOX) tablet 100 mg  100 mg Oral QHS [name],  [name], MD   100 mg at 11/23/16 2113  . insulin aspart (novoLOG) injection 0-15 Units  0-15 Units Subcutaneous TID WC Loletha Grayer, MD   3 Units at 11/24/16 1630  . insulin aspart (novoLOG) injection 0-5 Units  0-5 Units Subcutaneous QHS Loletha Grayer, MD   4 Units at 11/18/16 2158  . insulin aspart (novoLOG) injection 10 Units  10 Units Subcutaneous TID WC Fritzi Mandes, MD   10 Units at 11/24/16 1630  . insulin glargine (LANTUS) injection 55 Units  55 Units Subcutaneous QHS Fritzi Mandes, MD   55 Units at 11/22/16 2142  . lisinopril (PRINIVIL,ZESTRIL) tablet 20 mg  20 mg Oral Daily Bradie Lacock, Madie Reno, MD   20 mg at 11/24/16 0803  . magnesium hydroxide (MILK OF MAGNESIA) suspension 30 mL  30 mL Oral Daily PRN Shandreka Dante T, MD      . meloxicam (MOBIC) tablet 7.5 mg  7.5 mg Oral BID PC Dang Mathison T, MD   7.5 mg at 11/24/16 1704  . metFORMIN (GLUCOPHAGE) tablet 1,000 mg  1,000 mg Oral BID WC Kristeena Meineke T, MD   1,000 mg at 11/24/16 1704  . montelukast (SINGULAIR) tablet 10 mg  10 mg Oral QHS Aizik Reh T, MD   10 mg at 11/23/16 2112  . nicotine (NICODERM CQ - dosed in mg/24 hr) patch 7 mg  7 mg Transdermal Daily Chauncey Mann, MD   7 mg at 11/21/16 8850  . ondansetron (ZOFRAN) injection 4 mg  4 mg Intravenous Once PRN Gunnar Bulla, MD      . pantoprazole (PROTONIX) EC tablet 40 mg  40 mg Oral Daily [name], [name], MD   40 mg at 11/24/16 0803  . polyethylene glycol (MIRALAX / GLYCOLAX) packet 17 g  17 g Oral Daily Grantham Hippert, Madie Reno,  MD   17 g at 11/21/16 2774  . promethazine (PHENERGAN) tablet 12.5 mg  12.5 mg Oral Q6H PRN Nhyira Leano T, MD      . propranolol (INDERAL) tablet 20 mg  20 mg Oral BID [place], Madie Reno, MD   20 mg at 11/24/16 1704  . simvastatin (ZOCOR) tablet 40 mg  40 mg Oral q1800 Addilynne Olheiser, Madie Reno, MD   40 mg at 11/24/16 1704  . temazepam (RESTORIL) capsule 15 mg  15 mg Oral QHS [name], [name], MD   15 mg at 11/23/16 2112    Lab Results:  Results for orders placed or performed during the hospital encounter of 11/06/16 (from the past 48 hour(s))  Glucose, capillary     Status: Abnormal   Collection Time: 11/22/16  9:41 PM  Result Value Ref Range   Glucose-Capillary 167 (H) 65 - 99 mg/dL  Glucose, capillary     Status: Abnormal   Collection Time: 11/23/16  6:46 AM  Result Value Ref Range   Glucose-Capillary 304 (H) 65 - 99 mg/dL  Glucose, capillary     Status: Abnormal   Collection Time: 11/23/16 12:12 PM  Result Value Ref Range   Glucose-Capillary 157 (H) 65 - 99 mg/dL  Glucose, capillary     Status: Abnormal   Collection Time: 11/23/16  2:10 PM  Result Value Ref Range   Glucose-Capillary 159 (H) 65 - 99 mg/dL  Glucose, capillary     Status: Abnormal   Collection Time: 11/23/16  4:36 PM  Result Value Ref Range   Glucose-Capillary 278 (H) 65 - 99 mg/dL  Glucose, capillary     Status: None  Collection Time: 11/23/16  9:12 PM  Result Value Ref Range   Glucose-Capillary 76 65 - 99 mg/dL   Comment 1 Notify RN   CBC with Differential/Platelet     Status: Abnormal   Collection Time: 11/24/16  6:33 AM  Result Value Ref Range   WBC 8.4 3.8 - 10.6 K/uL   RBC 4.22 (L) 4.40 - 5.90 MIL/uL   Hemoglobin 13.0 13.0 - 18.0 g/dL   HCT 36.8 (L) 40.0 - 52.0 %   MCV 87.3 80.0 - 100.0 fL   MCH 30.9 26.0 - 34.0 pg   MCHC 35.4 32.0 - 36.0 g/dL   RDW 13.0 11.5 - 14.5 %   Platelets 184 150 - 440 K/uL   Neutrophils Relative % 51 %   Neutro Abs 4.3 1.4 - 6.5 K/uL   Lymphocytes Relative 37 %   Lymphs Abs  3.1 1.0 - 3.6 K/uL   Monocytes Relative 6 %   Monocytes Absolute 0.5 0.2 - 1.0 K/uL   Eosinophils Relative 5 %   Eosinophils Absolute 0.5 0 - 0.7 K/uL   Basophils Relative 1 %   Basophils Absolute 0.1 0 - 0.1 K/uL  Glucose, capillary     Status: Abnormal   Collection Time: 11/24/16  6:38 AM  Result Value Ref Range   Glucose-Capillary 179 (H) 65 - 99 mg/dL  Glucose, capillary     Status: Abnormal   Collection Time: 11/24/16 11:27 AM  Result Value Ref Range   Glucose-Capillary 128 (H) 65 - 99 mg/dL  Glucose, capillary     Status: Abnormal   Collection Time: 11/24/16  4:26 PM  Result Value Ref Range   Glucose-Capillary 192 (H) 65 - 99 mg/dL    Blood Alcohol level:  Lab Results  Component Value Date   ETH <5 10/21/2016   ETH <5 85/46/2703    Metabolic Disorder Labs: Lab Results  Component Value Date   HGBA1C 10.3 (H) 11/09/2016   MPG 249 11/09/2016   MPG 275 10/09/2016   Lab Results  Component Value Date   PROLACTIN 24.5 (H) 09/24/2016   PROLACTIN 3.4 (L) 07/07/2016   Lab Results  Component Value Date   CHOL 92 11/09/2016   TRIG 54 11/09/2016   HDL 39 (L) 11/09/2016   CHOLHDL 2.4 11/09/2016   VLDL 11 11/09/2016   LDLCALC 42 11/09/2016   LDLCALC 72 10/09/2016    Physical Findings: AIMS: Facial and Oral Movements Muscles of Facial Expression: Mild Lips and Perioral Area: Mild Jaw: Mild Tongue: Minimal,Extremity Movements Upper (arms, wrists, hands, fingers): None, normal Lower (legs, knees, ankles, toes): Minimal, Trunk Movements Neck, shoulders, hips: None, normal, Overall Severity Severity of abnormal movements (highest score from questions above): Mild Incapacitation due to abnormal movements: Minimal Patient's awareness of abnormal movements (rate only patient's report): No Awareness, Dental Status Current problems with teeth and/or dentures?: No Does patient usually wear dentures?: No   Musculoskeletal: Strength & Muscle Tone: within normal  limits Gait & Station: normal Patient leans: [name]  Psychiatric Specialty Exam: Physical Exam  Nursing note and vitals reviewed. Constitutional: He appears well-developed and well-nourished.  HENT:  Head: Normocephalic and atraumatic.  Eyes: Conjunctivae are normal. Pupils are equal, round, and reactive to light.  Neck: Normal range of motion.  Cardiovascular: Regular rhythm and normal heart sounds.   Respiratory: Effort normal.  GI: Soft.  Musculoskeletal: Normal range of motion.  Neurological: He is alert.  Skin: Skin is warm and dry.  Psychiatric: His affect is blunt. His speech  is delayed. He is slowed. Thought content is paranoid. Thought content is not delusional. Cognition and memory are normal. He expresses impulsivity.    Review of Systems  Constitutional: Negative.   HENT: Negative.   Eyes: Negative.   Cardiovascular: Negative.   Gastrointestinal: Negative.   Genitourinary: Negative.   Musculoskeletal: Negative.   Skin: Negative.   Neurological: Negative.   Endo/Heme/Allergies: Negative.   Psychiatric/Behavioral: Positive for depression and hallucinations.    Blood pressure 108/64, pulse 74, temperature 98 F (36.7 C), temperature source Oral, resp. rate (!) 22, height '5\' 8"'$  (1.727 m), weight 174 lb (78.9 kg), SpO2 97 %.Body mass index is 26.46 kg/m.  General Appearance: Disheveled  Eye Contact:  Poor  Speech:  Slow  Volume:  Decreased  Mood:  Dysphoric  Affect:  Blunt  Thought Process:  Linear  Orientation:  Full (Time, Place, and Person)  Thought Content:  Paranoid Ideation  Suicidal Thoughts:  No  Homicidal Thoughts:  No  Memory:  Immediate;   Fair Recent;   Fair Remote;   Fair  Judgement:  Impaired  Insight:  Lacking  Psychomotor Activity:  Decreased  Concentration:  Concentration: Fair and Attention Span: Fair  Recall:  [employer] of Knowledge:  Fair  Language:  Good  Akathisia:  No  Handed:  Right  AIMS (if indicated):     Assets:   Housing Physical Health  ADL's:  Intact  Cognition:  WNL  Sleep:  Number of Hours: 6     Treatment Plan Summary:  Mr. Mcmanamon 41 year old male with prior diagnosis of schizoaffective disorder who presented to the emergency room with vague suicidal thoughts as well as paranoid thoughts. He does. Be responding to internal stimuli and has endorsed some auditory and visual hallucinations. He will be admitted to inpatient psychiatry for medication management, safety and stabilization.  1. Schizoaffective disorder, depressive type. He was started on Clozaril and his dose was increased to 225 mg nightly for psychosis. Clozapine level 748 most likely due to interaction with the Luvox. I tried to lower Clozapine dose but in ERROR we discontinue Clozapine altogether. Last dose was given on Monday. I ordered Clozapine level on Friday unnecessarily. I restarted 100 mg of Clozapine tonight.   2. ECT. Patient had ECT on Friday,11/13/2016 for the first time in this hospitalization. I am optimistic that this treatment may make a real difference. Unfortunately I will be away from the hospital all of next week. I am leaving the patient in the care of Dr. Mamie Nick and will sign out to her. If the patient is still in the hospital after this next week we will plan to continue ECT treatment picking up again on July 2.   3. Metabolic syndrome monitoring. Lipid panel is normal, hemoglobin A1c 103.  4. EKG. Normal sinus rhythm, QTc 417.  5. Questionable history of polysubstance abuse: The patient denies any history of any polysubstance use but records indicate that there are may have been a history of polysubstance use. He was advised to abstain from alcohol and all illicit drugs as it may worsen anxiety mood symptoms as well as psychosis. The patient is not interested in any substance abuse treatment.  6. Nicotine use disorder. Nicotine patch is available.   7. Diabetes. He is on Metformin, Novolog 10 units with meals and  Lantus 55 units, along with ADA diet and blood sugar monitoring. Medicine input is greatly appreciated. Glucerna is discontinued.  8. Hypertension: Vital signs are stable. We'll continue lisinopril 20  mg daily.  9. Seasonal allergies: Continue Singulair 10 mg nightly  10. Anxiety/restlessness. He is on Propranolol.   11. Insomnia. We discontinued Trazodone and started Restoril. Slept 7.5 hours.  12. Back pain. We will give Robaxin.  13. Disposition: The patient will return to a group home. He will follow up with [facility] in [facility]. Consider ACT team.  I think he is continuing to get better and I think the ECT is of some help. I still would like him to get treatment on Friday. No change to medicine. Diabetes seems to be stabilizing a little bit. I spoke with social work today and there seems to be good news that his family may be willing to have him back once he is ready to leave the hospital. No other change to treatment supportive counseling with the family and the patient today.    Alethia Berthold, MD 11/24/2016, 7:12 PM

## 2016-11-24 NOTE — Plan of Care (Signed)
Problem: Activity: Goal: Sleeping patterns will improve Outcome: Progressing Patient slept for Estimated Hours of 6; q15 minutes safety round maintained, no injury or falls during this shift.    

## 2016-11-24 NOTE — Progress Notes (Signed)
Patient ID: Tony Cunningham, male   DOB: 19-Jan-1976, 41 y.o.   MRN: [id number] CSW got a return call from Pt's sister in law, Tony Cunningham, who was present with Pt's mother Tony Cunningham.She informed CSW that Pt is welcome home with his family so if he wants to come home he can.  They are wiilling to assist him with follow up. She said they visited a week or so ago on a Sunday and are aware of his current condition.  They are out of town and will be back tomorrow evening.  They are available to pick him up anytime after that.  CSW provided direct contact info and asked Tony Cunningham to inform Tony Cunningham that we have interpretor available in the future if she has other questions.  CSW agreed to follow up tomorrow to notify them of Pt preferences.  Tony Shark, LCSW

## 2016-11-24 NOTE — Progress Notes (Signed)
Patient ID: Irene Mitcham, male   DOB: 1976/04/23, 41 y.o.   MRN: [id number] During POCT patient coherently and surprisingly said, "Abi, I have a weird feelings; a feeling of someone else, don't get me wrong, It is a good feeling, I think the ECT is doing this to me!!!" Patient was assured that we would continue the conversation after POCT. Later, patient repeated the same thing that the "ECT" is responsible for the way he feels; requested assist to shave.

## 2016-11-24 NOTE — Progress Notes (Signed)
Patient ID: Tony Cunningham, male   DOB: 29-Dec-1975, 41 y.o.   MRN: [id number]  CSW spoke with [facility], Tami Lin.  She says they didn't mean to give Pt's bed away and are glad to have him back.  They misunderstood from the ER that they were sending Pt to [facility] so they filled his bed.  She verbalizes that she is actively trying to move the newest Pt who is agreeable to another facility so that she can take Tony Cunningham back.  He has been there over a year and she says she doesn't want to force him to change group homes.  She agreed to keep in touch if she is able to transition Pt as planned and has bed for Tony Cunningham.  CSW will continue to follow up.  CSW informed her we would still have to actively look for other beds on his behalf in case she didn't have bed available at time of discharge.  She verbalizes understanding.  Jake Shark, LCSW

## 2016-11-24 NOTE — Progress Notes (Signed)
Patient verbalized he was cold on the unit, so wanted to stay in bed wrapped up in blanket. Patient denies any SI/HI/AH/VH. Patient is more assertive than in the past several days, but at times have flat affect. Patient is alert and oriented x 4, breathing unlabored, and extremities x 4 within normal limits. Patient is calm and cooperative. Patient did not display any disruptive behavior. Patient continues to be monitored on 15 minute safety checks. Will continue to monitor patient and notify MD of any changes.

## 2016-11-24 NOTE — Plan of Care (Signed)
Problem: Safety: Goal: Ability to remain free from injury will improve Outcome: Progressing Patient remains safe and without injury during hospitalization and on Q 15 minute monitoring. Will continue to monitor patient.

## 2016-11-24 NOTE — Progress Notes (Signed)
MEDICATION RELATED CONSULT NOTE - FOLLOW UP   Pharmacy Consult for Clozapine Monitoring Indication: Schizophrenia  No Known Allergies  Patient Measurements: Height: 5\' 8"  (172.7 cm) Weight: 174 lb (78.9 kg) IBW/kg (Calculated) : 68.4   Vital Signs: Temp: 98 F (36.7 C) (07/03 0700) Temp Source: Oral (07/03 0700) BP: 108/64 (07/03 0700) Pulse Rate: 74 (07/03 0700) Labs:  Recent Labs  11/24/16 0633  WBC 8.4  HGB 13.0  HCT 36.8*  PLT 184   Estimated Creatinine Clearance: 115.2 mL/min (A) (by C-G formula based on SCr of 0.54 mg/dL (L)).   Plan:  Patient eligible for dispensing per [facility] site. Reported todays ANC 4300 to site. Next ANC due:  July 10  Tony Cunningham K, RPh 11/24/2016,9:30 AM

## 2016-11-24 NOTE — [facility] Suicide Risk Assessment (Signed)
[facility] INPATIENT:  Family/Significant Other Suicide Prevention Education  Suicide Prevention Education:  Contact Attempts: [name], [phone], (name of family member/significant other) has been identified by the patient as the family member/significant other with whom the patient will be residing, and identified as the person(s) who will aid the patient in the event of a mental health crisis.  With written consent from the patient, two attempts were made to provide suicide prevention education, prior to and/or following the patient's discharge.  We were unsuccessful in providing suicide prevention education.  A suicide education pamphlet was given to the patient to share with family/significant other.  Date and time of first attempt: 11/13/2016 / 11:01am; No answer, CSW Jerald Kief  left HIPAA compliant voicemail requesting returned call.  Date and time of second attempt: 11/24/2016/9:00am, NO Answer, CSW Jake Shark left voicemail requesting returned call.  Tony Cunningham 11/24/2016, 9:05 AM

## 2016-11-24 NOTE — [facility] Group Notes (Signed)
  [facility] LCSW Group Therapy Note  Date/Time:11/24/16  Type of Therapy/Topic:  Group Therapy:  Feelings about Diagnosis  Participation Level:  Did Not Attend    Glennon Mac, LCSW 11/24/2016, 4:56 PM

## 2016-11-24 NOTE — Progress Notes (Signed)
Recreation Therapy Notes  Date: 07.03.18 Time: 9:30 am Location: Craft Room  Group Topic: Goal Setting  Goal Area(s) Addresses:  Patient will identify at least one goal. Patient will identify at least one obstacle.  Behavioral Response: Did not attend  Intervention: Recovery Goal Chart  Activity: Patients were instructed to make a Recovery Goal Chart including goals, obstacles, the date they started working on their goals, and the date they achieved their goals.  Education: LRT educated patients on healthy ways to celebrate achieving their goals.  Education Outcome: Patient did not attend group.   Clinical Observations/Feedback: Patient did not attend group.  Jacquelynn Cree, LRT/CTRS 11/24/2016 10:24 AM

## 2016-11-24 NOTE — Progress Notes (Signed)
Patient ID: Tony Cunningham, male   DOB: 01-16-1976, 41 y.o.   MRN: [id number] CSW informed Pt that mother says he can return home if he chooses, he agrees he would like to and potentially follow up with [facility] in [place].  CSW will discuss with MD appropriateness for referral to ACTT team.  If Pt consents will make referral to ACTT team in [place].  Jake Shark, LCSW

## 2016-11-24 NOTE — Progress Notes (Signed)
CH met with pt during the spiritual group. Pt was coherent and talked about his plan to moved to his mothers house. Pt said he would like to learn how to cook food for himself, live in a secure place, and find himself a job. Pt stated he was going to write out his goals and would share it with the [facility]  On Thursday. Pt was calm and in great spirit this afternoon.    11/24/16 1600  Clinical Encounter Type  Visited With Patient  Visit Type Initial;Spiritual support  Referral From Nurse  Consult/Referral To Chaplain  Spiritual Encounters  Spiritual Needs Emotional;Other (Comment)

## 2016-11-24 NOTE — [facility] Group Notes (Signed)
Goals Group  Date/Time: 11/24/2016, 9:00 AM Type of Therapy and Topic: Group Therapy: Goals Group: SMART Goals  ?  Participation Level: Did not attend ?  Description of Group:  ?  The purpose of a daily goals group is to assist and guide patients in setting recovery/wellness-related goals. The objective is to set goals as they relate to the crisis in which they were admitted. Patients will be using SMART goal modalities to set measurable goals. Characteristics of realistic goals will be discussed and patients will be assisted in setting and processing how one will reach their goal. Facilitator will also assist patients in applying interventions and coping skills learned in psycho-education groups to the SMART goal and process how one will achieve defined goal.  ?  Therapeutic Goals:  ?  -Patients will develop and document one goal related to or their crisis in which brought them into treatment.  -Patients will be guided by LCSW using SMART goal setting modality in how to set a measurable, attainable, realistic and time sensitive goal.  -Patients will process barriers in reaching goal.  -Patients will process interventions in how to overcome and successful in reaching goal.  ?  Patient's Goal: Did not attend ?  Therapeutic Modalities:  Motivational Interviewing  Cognitive Behavioral Therapy  Crisis Intervention Model  SMART goals setting  Hampton Abbot, MSW, LCSW-A 11/24/2016, 11:39AM

## 2016-11-25 LAB — GLUCOSE, CAPILLARY
GLUCOSE-CAPILLARY: 315 mg/dL — AB (ref 65–99)
GLUCOSE-CAPILLARY: 57 mg/dL — AB (ref 65–99)
GLUCOSE-CAPILLARY: 139 mg/dL — AB (ref 65–99)
GLUCOSE-CAPILLARY: 71 mg/dL (ref 65–99)
Glucose-Capillary: 123 mg/dL — ABNORMAL HIGH (ref 65–99)

## 2016-11-25 MED ORDER — GLUCERNA SHAKE PO LIQD
237.0000 mL | Freq: Three times a day (TID) | ORAL | Status: DC
  Administered 2016-11-25 – 2016-12-08 (×31): 237 mL via ORAL

## 2016-11-25 NOTE — Tx Team (Signed)
Interdisciplinary Treatment and Diagnostic Plan Update  11/23/2016 Time of Session: 10:30 Tony Cunningham MRN: [id number]  Principal Diagnosis: Schizoaffective disorder, depressive type ([place])  Secondary Diagnoses: Principal Problem:   Schizoaffective disorder, depressive type ([place]) Active Problems:   Diabetes ([place])   HTN (hypertension)   Tobacco use disorder   Dyslipidemia   Asthma   Tardive dyskinesia   Polysubstance abuse   Current Medications:  Current Facility-Administered Medications  Medication Dose Route Frequency Provider Last Rate Last Dose  . acetaminophen (TYLENOL) tablet 650 mg  650 mg Oral Q6H PRN [name], Madie Reno, MD   650 mg at 11/19/16 1739  . alum & mag hydroxide-simeth (MAALOX/MYLANTA) 200-200-20 MG/5ML suspension 30 mL  30 mL Oral Q4H PRN [name], [name], MD      . cloZAPine (CLOZARIL) tablet 100 mg  100 mg Oral QHS [name], [name], MD   100 mg at 11/24/16 2151  . dextrose 5 %-0.45 % sodium chloride infusion   Intravenous Continuous [name], Madie Reno, MD 10 mL/hr at 11/13/16 1009    . feeding supplement (GLUCERNA SHAKE) (GLUCERNA SHAKE) liquid 237 mL  237 mL Oral TID WC [name], [name], MD      . fentaNYL (SUBLIMAZE) injection 25 mcg  25 mcg Intravenous Q5 min PRN Gunnar Bulla, MD      . fluvoxaMINE (LUVOX) tablet 100 mg  100 mg Oral QHS [name], [name], MD   100 mg at 11/24/16 2152  . insulin aspart (novoLOG) injection 0-15 Units  0-15 Units Subcutaneous TID WC Loletha Grayer, MD   11 Units at 11/25/16 [phone]  . insulin aspart (novoLOG) injection 0-5 Units  0-5 Units Subcutaneous QHS Loletha Grayer, MD   3 Units at 11/24/16 2155  . insulin aspart (novoLOG) injection 10 Units  10 Units Subcutaneous TID WC Fritzi Mandes, MD   10 Units at 11/25/16 [phone]  . insulin glargine (LANTUS) injection 55 Units  55 Units Subcutaneous QHS Fritzi Mandes, MD   55 Units at 11/24/16 2155  . lisinopril (PRINIVIL,ZESTRIL) tablet 20 mg  20 mg Oral Daily [name], [name], MD   20 mg  at 11/25/16 0830  . magnesium hydroxide (MILK OF MAGNESIA) suspension 30 mL  30 mL Oral Daily PRN [name], [name], MD      . meloxicam (MOBIC) tablet 7.5 mg  7.5 mg Oral BID PC [name], [name], MD   7.5 mg at 11/25/16 0836  . metFORMIN (GLUCOPHAGE) tablet 1,000 mg  1,000 mg Oral BID WC [name], [name], MD   1,000 mg at 11/25/16 0830  . montelukast (SINGULAIR) tablet 10 mg  10 mg Oral QHS [name], Madie Reno, MD   10 mg at 11/24/16 2152  . nicotine (NICODERM CQ - dosed in mg/24 hr) patch 7 mg  7 mg Transdermal Daily Chauncey Mann, MD   7 mg at 11/25/16 0831  . ondansetron (ZOFRAN) injection 4 mg  4 mg Intravenous Once PRN Gunnar Bulla, MD      . pantoprazole (PROTONIX) EC tablet 40 mg  40 mg Oral Daily [name], [name], MD   40 mg at 11/25/16 0830  . polyethylene glycol (MIRALAX / GLYCOLAX) packet 17 g  17 g Oral Daily [name], Madie Reno, MD   17 g at 11/25/16 0831  . promethazine (PHENERGAN) tablet 12.5 mg  12.5 mg Oral Q6H PRN [name], [name], MD      . propranolol (INDERAL) tablet 20 mg  20 mg Oral BID [name], Madie Reno, MD  20 mg at 11/25/16 0830  . simvastatin (ZOCOR) tablet 40 mg  40 mg Oral q1800 [name], Madie Reno, MD   40 mg at 11/24/16 1704  . temazepam (RESTORIL) capsule 15 mg  15 mg Oral QHS [name], [name], MD   15 mg at 11/24/16 2154   PTA Medications: Prescriptions Prior to Admission  Medication Sig Dispense Refill Last Dose  . cloZAPine (CLOZARIL) 100 MG tablet Take 1 tablet (100 mg total) by mouth daily. Daily at 1700 30 tablet 0 11/12/2016  . cyclobenzaprine (FLEXERIL) 10 MG tablet Take 1 tablet (10 mg total) by mouth 3 (three) times daily as needed for muscle spasms. 60 tablet 0 11/12/2016  . fluvoxaMINE (LUVOX) 50 MG tablet Take 1 tablet (50 mg total) by mouth daily. Daily at 1700 30 tablet 0 11/12/2016  . insulin aspart (NOVOLOG) 100 UNIT/ML injection Inject 5 Units into the skin 3 (three) times daily with meals. 5 mL 0 11/12/2016  . insulin glargine (LANTUS) 100 UNIT/ML  injection Inject 0.42 mLs (42 Units total) into the skin at bedtime. 13 mL 0 11/12/2016  . ipratropium (ATROVENT) 0.06 % nasal spray Place 1 spray into both nostrils daily after supper. 3 mL 0 11/12/2016  . lidocaine (LIDODERM) 5 % Place 1 patch onto the skin daily. Remove & Discard patch within 12 hours or as directed by MD 30 patch 0 11/12/2016  . lisinopril (PRINIVIL,ZESTRIL) 20 MG tablet Take 1 tablet (20 mg total) by mouth daily. 30 tablet 0 11/12/2016  . meloxicam (MOBIC) 7.5 MG tablet Take 1 tablet (7.5 mg total) by mouth 2 (two) times daily. 60 tablet 0 11/12/2016  . metFORMIN (GLUCOPHAGE) 1000 MG tablet Take 1 tablet (1,000 mg total) by mouth 2 (two) times daily with a meal. 60 tablet 0 11/12/2016  . montelukast (SINGULAIR) 10 MG tablet Take 1 tablet (10 mg total) by mouth at bedtime. For Asthma 30 tablet 0 11/12/2016  . polyethylene glycol (MIRALAX / GLYCOLAX) packet Take 17 g by mouth daily. 30 each 0 11/12/2016  . propranolol (INDERAL) 20 MG tablet Take 1 tablet (20 mg total) by mouth 2 (two) times daily. 60 tablet 0 11/13/2016 at Unknown time  . simvastatin (ZOCOR) 40 MG tablet Take 1 tablet (40 mg total) by mouth at bedtime. For high cholesterol 15 tablet 0 11/12/2016    Patient Stressors:    Patient Strengths:    Treatment Modalities: Medication Management, Group therapy, Case management,  1 to 1 session with clinician, Psychoeducation, Recreational therapy.   Physician Treatment Plan for Primary Diagnosis: Schizoaffective disorder, depressive type ([place]) Long Term Goal(s): Improvement in symptoms so as ready for discharge Improvement in symptoms so as ready for discharge   Short Term Goals: Ability to verbalize feelings will improve Ability to demonstrate self-control will improve Ability to identify and develop effective coping behaviors will improve Ability to identify and develop effective coping behaviors will improve Ability to identify triggers associated with substance  abuse/mental health issues will improve  Medication Management: Evaluate patient's response, side effects, and tolerance of medication regimen.  Therapeutic Interventions: 1 to 1 sessions, Unit Group sessions and Medication administration.  Evaluation of Outcomes: Not Met  Physician Treatment Plan for Secondary Diagnosis: Principal Problem:   Schizoaffective disorder, depressive type ([place]) Active Problems:   Diabetes ([place])   HTN (hypertension)   Tobacco use disorder   Dyslipidemia   Asthma   Tardive dyskinesia   Polysubstance abuse  Long Term Goal(s): Improvement in symptoms so as ready for discharge  Improvement in symptoms so as ready for discharge   Short Term Goals: Ability to verbalize feelings will improve Ability to demonstrate self-control will improve Ability to identify and develop effective coping behaviors will improve Ability to identify and develop effective coping behaviors will improve Ability to identify triggers associated with substance abuse/mental health issues will improve     Medication Management: Evaluate patient's response, side effects, and tolerance of medication regimen.  Therapeutic Interventions: 1 to 1 sessions, Unit Group sessions and Medication administration.  Evaluation of Outcomes: Not Met   RN Treatment Plan for Primary Diagnosis: Schizoaffective disorder, depressive type ([place]) Long Term Goal(s): Knowledge of disease and therapeutic regimen to maintain health will improve  Short Term Goals: Ability to identify and develop effective coping behaviors will improve and Compliance with prescribed medications will improve  Medication Management: RN will administer medications as ordered by provider, will assess and evaluate patient's response and provide education to patient for prescribed medication. RN will report any adverse and/or side effects to prescribing provider.  Therapeutic Interventions: 1 on 1 counseling sessions,  Psychoeducation, Medication administration, Evaluate responses to treatment, Monitor vital signs and CBGs as ordered, Perform/monitor CIWA, COWS, AIMS and Fall Risk screenings as ordered, Perform wound care treatments as ordered.  Evaluation of Outcomes: Not Met   LCSW Treatment Plan for Primary Diagnosis: Schizoaffective disorder, depressive type ([place]) Long Term Goal(s): Safe transition to appropriate next level of care at discharge, Engage patient in therapeutic group addressing interpersonal concerns.  Short Term Goals: Engage patient in aftercare planning with referrals and resources and Increase skills for wellness and recovery  Therapeutic Interventions: Assess for all discharge needs, 1 to 1 time with Social worker, Explore available resources and support systems, Assess for adequacy in community support network, Educate family and significant other(s) on suicide prevention, Complete Psychosocial Assessment, Interpersonal group therapy.  Evaluation of Outcomes: Not Met   Progress in Treatment: Attending groups: No. Participating in groups: No. Taking medication as prescribed: Yes. Toleration medication: Yes. Family/Significant other contact made: No, will contact:  attempt to contact patient's mother again.  Patient understands diagnosis: Yes. Discussing patient identified problems/goals with staff: Yes. Medical problems stabilized or resolved: Yes. Denies suicidal/homicidal ideation: Yes. Issues/concerns per patient self-inventory: No. Other: n/a  New problem(s) identified: None identified at this time.   New Short Term/Long Term Goal(s): None identified at this time. Pt has not been participating in unit programming and mostly isolates/withdraws to room with minimum interaction with peers or staff.   Discharge Plan or Barriers: ECT treatments resumed on July 2nd, Pt will return home with Mother and needs referral to ACTT team in [place] or other appropriate  outpatient services.   Reason for Continuation of Hospitalization: Depression Medication stabilization Suicidal ideation Other; describe ECT treatment  Estimated Length of Stay: 7 days.  Attendees: Patient:Tony Cunningham 11/23/2016 4:29 PM  Physician: Orson Slick 11/23/2016 4:29 PM  Nursing: Hamilton Capri, RN 11/23/2016 4:29 PM  RN Care Manager: 11/23/2016 4:29 PM  Social Worker: Dossie Arbour, LCSW 11/23/2016 4:29 PM  Recreational Therapist: Everitt Amber, LRT 11/23/2016 4:29 PM  Other:    Other:    Other:     Scribe for Treatment Team: August Saucer, LCSW 7/2/20108 4:29 PM

## 2016-11-25 NOTE — Progress Notes (Signed)
Patient ID: Tony Cunningham, male   DOB: August 31, 1975, 41 y.o.   MRN: [id number] CSW called [facility] to get fax information and referral form.  No one available due to holiday, no referral form online.  Will ask CSW to follow up tomorrow with referral.  Jake Shark, LCSW

## 2016-11-25 NOTE — Progress Notes (Signed)
[facility] MD Progress Note  11/25/2016 3:18 PM Tony Cunningham  MRN:  [id number]  Subjective:  Tony Cunningham is a 41 year old male with prior diagnosis of schizoaffective disorder. He received ECT to be continued next week if appropriate.  He was started on Clozapine and his dose was titrated to 225 mg. He is also on Luvox. Clozapine level was quite high with resulting sedation, because of the combination. Unfortunately in ERROR his Clozapine was discotoniued after Monday. I restarted 100 mg of Clozapine tonight. Actually, he is doing well without it, less sedated. Not knowing he was off Clozapine I took another level Thursday night.  11/08/16. The patient has been very isolative and withdrawn. Hygiene is poor and he is malodorous. He has not showered. He did go outside with the other patients and has been getting up for meals but not attending groups or interacting with staff or peers. He denies any current active or passive suicidal thoughts but does admit to some very vague paranoid thoughts that others are watching him. He is very limited and talking with this Probation officer and wanted to be "left alone to sleep". He denied any current somatic complaints. Vital signs are stable and he slept fairly well last night but is also sleeping partially during the daytime. So far, he is tolerating the Clozaril fairly well without any physical adverse side effects.  Follow-up for this 41 year old man with what appears to be psychotic depression or schizoaffective disorder. Patient remains in bed most of the time. Poorly interactive. Mood is depressed. Passive suicidal thoughts.  Follow-up for Thursday the 21st. Patient remains withdrawn very little activity. Blood sugars are also running much higher. Patient continues to endorse feeling depressed and having vague suicidal thoughts.  Follow-up for Friday the 22nd. Patient finally had ECT this morning bilateral treatment which went off without any difficulty or complication. I saw him  this afternoon and I think he actually was a little more interactive and verbal and energetic than before. This may be wishful thinking but it makes me optimistic. No specific complaints other than headache  Patient was seen today for follow-up. Patient has no new complaints. Continues to say his thoughts are racing but his affect is flat and withdrawn he is almost completely withdrawn from other people and stays in his room by himself. Suicidal thoughts passive no intention or plan. Still has psychotic symptoms. Blood sugars unfortunately continued to run quite high.  6/23 patient denies having any issues or concerns today. Says that he has been eating and sleeping well. Denies side effects from medications or any physical complaints. Denies suicidality or homicidality. He says that he might be hallucinating but he is not sure. He said that he had 1 ECT procedure and it went well.   Blood glucose this morning was 92. Patient is compliant with medications. No major events overnight per nursing.  6/24 patient did not sleep well last night. Other than that he denies having any issues or concerns. He denies suicidality, homicidality or auditory visual hallucinations. During examination he appears much brighter and more conversational. His thought processes are linear.  11/16/2016. Tony Cunningham does not like talking to me today. He is in his room he is in bed He does not open his eyes for me and is unable to keep a conversation he keeps telling me okay okay and is unwilling to answer any questions. He received just one ECT treatment last week. Since Dr. Weber Cooks is on vacation, ECT will resume next week.  He  accepts medications and seems to tolerate them well Clozaril level is over 700. This is probably from a combination of clozapine and Luvox. I will alert dose of clozapine to 200 mg  To avoid side effects. I will switch to Luvox to 1 nightly dose to improve sleep.  11/17/2016. Tony Cunningham is much more animated and  interactive today. He is in his room in bed bugs is able to hold a conversation and maintains good eye contact. He complains of stomach ache. He did have a bowel movement this morning. His mood is still depressed and he needs much encouragement to attend to his ADLs. Staff needs to strongly encourage showering. He has high hopes for ECT and will not stay in the hospital until next week to resume ECT with Dr. Weber Cooks Is back from vacation. Yesterday he is agreeable to the nighttime and clozapine dose decrease due to high clozapine level. This could have helped the patient stated more awake this morning. He slept almost 6 hours.  11/18/2016. Tony Cunningham seems more relaxed and talkative today. He is still very flat on his back but opens his eyes for me to we are having a small conversation. He has not been sleeping well. There are in spite of medication adjustments. He believes that trazodone works well for him. I will start trazodone tonight. She no longer complains of stomach pain. He does not participate in programming but takes medications with no problem. She met with the group home representative this week and was accepted to a group home with a bed open at the beginning of July. When asked returns from vacation, he will decide whether to continue ECT or whether the patient will be discharged.  11/19/2016. Tony Cunningham seems better today. This morning he has been up and walking around. He complains of "tension" in his head both mental and physical. He slept 3 hours only again in spite of multiple sedating medications given at night including Trazodone. I will switch him to Restoril tonight. It may need to be discontinued if her restarts ECT next week. No other somatic complaints. Sugers still elevated at times. I changed diet to ADA and discontinued Glucerna. Input from diabetes nurse coordinator and medicine consultant is greatly appreciated. He does not interact with staff/peers or participate in programing. Hygiene  is still questionable.  11/20/2016. Tony Cunningham is up and about today. He comes to my office for interview. He seems more relaxed today. Still very vague in his answers. Complains of back and neck pain but has been in bed on his back most of the week. His sugar was low today as he was switched to ADA diet and Glucerna was discontinued upon [facility] suggestion. He is looking forward to see Dr. Weber Cooks "who is the best". He corrects himself to tell me that I am not bad either.  Follow-up for June 30. Patient seen. Spoke with nursing. Patient continues to report feeling bad. Mood feels depressed. Denies acute suicidal thoughts but feels hopeless about the future. Feels paranoid and vaguely report psychosis. Patient continues to have poor self-care and poor interaction with others on the unit. Physically he seems to be stabilizing a little.  Follow-up Sunday, July 1. Patient seen. He looks a little better groomed today. He has been up out of his bed although he does not attend any groups. He is ambivalent about his mood today. Denies acute hallucinations or suicidal thinking. Still feels hopeless about discharge.  Patient seen for follow-up on Monday, July 2. He  had ECT treatment this morning which was complicated by some delirium and agitation during the recovery phase. Otherwise treatment went fine. Seen this afternoon the patient is still a little groggy from the medicines he received. No specific physical complaints however. Mood is still depressed and he is still slow and cognitively impaired. Denies acute suicidal intent. Sugars continue to be very labile. He is requesting a restart of his Glucerna but I am unsure whether that is appropriate.  [place] Tuesday, July 3. Patient has no new complaints. In fact today he seems to think he is doing a little bit better. His affect looked brighter and he was more conversant with me. He is taking some steps to improve his appearance.  Follow-up for Wednesday, July 4.  Patient seen. His only complaint today is wanting to have the dietary supplement Glucerna shake back. He thinks his mood is feeling better. He lets me know that he has been out of bed watching TV and being around people. Hallucinations are minimal. Denies suicidal thoughts. Admits that he is feeling a little more optimistic about going home with his family.  Per nursing: Pt calm, cooperative and pleasant.  AM BS 149.  Speech is more clear and logical, though remains hypoverbal.  Complains of muscle pain in neck starting approx. 1 week ago.  PRN acetaminophen offered and eventually accepted.  Reports moderate relief.  Remains frequently isolative to room.  No behavioral issues.    Principal Problem: Schizoaffective disorder, depressive type ([place]) Diagnosis:   Patient Active Problem List   Diagnosis Date Noted  . Schizoaffective disorder, depressive type ([place]) [F25.1] 09/23/2016  . Polysubstance abuse [F19.10] 07/17/2016  . Tardive dyskinesia [G24.01] 07/16/2016  . Tobacco use disorder [F17.200] 07/07/2016  . Dyslipidemia [E78.5] 07/07/2016  . Asthma [J45.909] 07/07/2016  . HTN (hypertension) [I10] 07/06/2016  . Diabetes ([place]) [E11.9] 12/25/2010   Total Time spent with patient: 30 minutes    Past Medical History:  Past Medical History:  Diagnosis Date  . Anxiety   . Asthma   . Diabetes mellitus   . High blood pressure   . Sinus complaint    History reviewed. No pertinent surgical history.  Social History:  History  Alcohol Use No     History  Drug Use No    Social History   Social History  . Marital status: Single    Spouse name: [name]  . Number of children: [name]  . Years of education: [name]   Social History Main Topics  . Smoking status: Current Every Day Smoker    Packs/day: 0.50    Types: Cigarettes  . Smokeless tobacco: Never Used  . Alcohol use No  . Drug use: No  . Sexual activity: Not Currently   Other Topics Concern  . None   Social History Narrative  .  None   Additional Social History:                         Sleep: Good  Appetite:  Good  Current Medications: Current Facility-Administered Medications  Medication Dose Route Frequency Provider Last Rate Last Dose  . acetaminophen (TYLENOL) tablet 650 mg  650 mg Oral Q6H PRN Gabriela Irigoyen, Madie Reno, MD   650 mg at 11/19/16 1739  . alum & mag hydroxide-simeth (MAALOX/MYLANTA) 200-200-20 MG/5ML suspension 30 mL  30 mL Oral Q4H PRN Brenlynn Fake T, MD      . cloZAPine (CLOZARIL) tablet 100 mg  100 mg Oral QHS [name], Jolanta  B, MD   100 mg at 11/24/16 2151  . dextrose 5 %-0.45 % sodium chloride infusion   Intravenous Continuous Dohn Stclair, Madie Reno, MD 10 mL/hr at 11/13/16 1009    . feeding supplement (GLUCERNA SHAKE) (GLUCERNA SHAKE) liquid 237 mL  237 mL Oral TID WC Kriston Mckinnie T, MD      . fentaNYL (SUBLIMAZE) injection 25 mcg  25 mcg Intravenous Q5 min PRN Gunnar Bulla, MD      . fluvoxaMINE (LUVOX) tablet 100 mg  100 mg Oral QHS [name], [name], MD   100 mg at 11/24/16 2152  . insulin aspart (novoLOG) injection 0-15 Units  0-15 Units Subcutaneous TID WC Loletha Grayer, MD   11 Units at 11/25/16 [phone]  . insulin aspart (novoLOG) injection 0-5 Units  0-5 Units Subcutaneous QHS Loletha Grayer, MD   3 Units at 11/24/16 2155  . insulin aspart (novoLOG) injection 10 Units  10 Units Subcutaneous TID WC Fritzi Mandes, MD   10 Units at 11/25/16 [phone]  . insulin glargine (LANTUS) injection 55 Units  55 Units Subcutaneous QHS Fritzi Mandes, MD   55 Units at 11/24/16 2155  . lisinopril (PRINIVIL,ZESTRIL) tablet 20 mg  20 mg Oral Daily Lolly Glaus T, MD   20 mg at 11/25/16 0830  . magnesium hydroxide (MILK OF MAGNESIA) suspension 30 mL  30 mL Oral Daily PRN Malachi Kinzler T, MD      . meloxicam (MOBIC) tablet 7.5 mg  7.5 mg Oral BID PC Kaedon Fanelli T, MD   7.5 mg at 11/25/16 0836  . metFORMIN (GLUCOPHAGE) tablet 1,000 mg  1,000 mg Oral BID WC Bettyanne Dittman T, MD   1,000 mg at 11/25/16 0830   . montelukast (SINGULAIR) tablet 10 mg  10 mg Oral QHS Brendi Mccarroll, Madie Reno, MD   10 mg at 11/24/16 2152  . nicotine (NICODERM CQ - dosed in mg/24 hr) patch 7 mg  7 mg Transdermal Daily Chauncey Mann, MD   7 mg at 11/25/16 0831  . ondansetron (ZOFRAN) injection 4 mg  4 mg Intravenous Once PRN Gunnar Bulla, MD      . pantoprazole (PROTONIX) EC tablet 40 mg  40 mg Oral Daily [name], [name], MD   40 mg at 11/25/16 0830  . polyethylene glycol (MIRALAX / GLYCOLAX) packet 17 g  17 g Oral Daily Dereka Lueras, Madie Reno, MD   17 g at 11/25/16 0831  . promethazine (PHENERGAN) tablet 12.5 mg  12.5 mg Oral Q6H PRN Arnell Mausolf T, MD      . propranolol (INDERAL) tablet 20 mg  20 mg Oral BID Saleh Ulbrich, Madie Reno, MD   20 mg at 11/25/16 0830  . simvastatin (ZOCOR) tablet 40 mg  40 mg Oral q1800 Ruchy Wildrick, Madie Reno, MD   40 mg at 11/24/16 1704  . temazepam (RESTORIL) capsule 15 mg  15 mg Oral QHS [name], [name], MD   15 mg at 11/24/16 2154    Lab Results:  Results for orders placed or performed during the hospital encounter of 11/06/16 (from the past 48 hour(s))  Glucose, capillary     Status: Abnormal   Collection Time: 11/23/16  4:36 PM  Result Value Ref Range   Glucose-Capillary 278 (H) 65 - 99 mg/dL  Glucose, capillary     Status: None   Collection Time: 11/23/16  9:12 PM  Result Value Ref Range   Glucose-Capillary 76 65 - 99 mg/dL   Comment 1 Notify RN   CBC with Differential/Platelet  Status: Abnormal   Collection Time: 11/24/16  6:33 AM  Result Value Ref Range   WBC 8.4 3.8 - 10.6 K/uL   RBC 4.22 (L) 4.40 - 5.90 MIL/uL   Hemoglobin 13.0 13.0 - 18.0 g/dL   HCT 36.8 (L) 40.0 - 52.0 %   MCV 87.3 80.0 - 100.0 fL   MCH 30.9 26.0 - 34.0 pg   MCHC 35.4 32.0 - 36.0 g/dL   RDW 13.0 11.5 - 14.5 %   Platelets 184 150 - 440 K/uL   Neutrophils Relative % 51 %   Neutro Abs 4.3 1.4 - 6.5 K/uL   Lymphocytes Relative 37 %   Lymphs Abs 3.1 1.0 - 3.6 K/uL   Monocytes Relative 6 %   Monocytes Absolute 0.5  0.2 - 1.0 K/uL   Eosinophils Relative 5 %   Eosinophils Absolute 0.5 0 - 0.7 K/uL   Basophils Relative 1 %   Basophils Absolute 0.1 0 - 0.1 K/uL  Glucose, capillary     Status: Abnormal   Collection Time: 11/24/16  6:38 AM  Result Value Ref Range   Glucose-Capillary 179 (H) 65 - 99 mg/dL  Glucose, capillary     Status: Abnormal   Collection Time: 11/24/16 11:27 AM  Result Value Ref Range   Glucose-Capillary 128 (H) 65 - 99 mg/dL  Glucose, capillary     Status: Abnormal   Collection Time: 11/24/16  4:26 PM  Result Value Ref Range   Glucose-Capillary 192 (H) 65 - 99 mg/dL  Glucose, capillary     Status: Abnormal   Collection Time: 11/24/16  9:43 PM  Result Value Ref Range   Glucose-Capillary 309 (H) 65 - 99 mg/dL  Glucose, capillary     Status: Abnormal   Collection Time: 11/24/16  9:44 PM  Result Value Ref Range   Glucose-Capillary 287 (H) 65 - 99 mg/dL  Glucose, capillary     Status: Abnormal   Collection Time: 11/25/16  6:45 AM  Result Value Ref Range   Glucose-Capillary 315 (H) 65 - 99 mg/dL  Glucose, capillary     Status: Abnormal   Collection Time: 11/25/16 11:41 AM  Result Value Ref Range   Glucose-Capillary 57 (L) 65 - 99 mg/dL   Comment 1 Notify RN   Glucose, capillary     Status: Abnormal   Collection Time: 11/25/16 12:09 PM  Result Value Ref Range   Glucose-Capillary 123 (H) 65 - 99 mg/dL    Blood Alcohol level:  Lab Results  Component Value Date   ETH <5 10/21/2016   ETH <5 50/56/9794    Metabolic Disorder Labs: Lab Results  Component Value Date   HGBA1C 10.3 (H) 11/09/2016   MPG 249 11/09/2016   MPG 275 10/09/2016   Lab Results  Component Value Date   PROLACTIN 24.5 (H) 09/24/2016   PROLACTIN 3.4 (L) 07/07/2016   Lab Results  Component Value Date   CHOL 92 11/09/2016   TRIG 54 11/09/2016   HDL 39 (L) 11/09/2016   CHOLHDL 2.4 11/09/2016   VLDL 11 11/09/2016   LDLCALC 42 11/09/2016   LDLCALC 72 10/09/2016    Physical Findings: AIMS:  Facial and Oral Movements Muscles of Facial Expression: Mild Lips and Perioral Area: Mild Jaw: Mild Tongue: Minimal,Extremity Movements Upper (arms, wrists, hands, fingers): None, normal Lower (legs, knees, ankles, toes): Minimal, Trunk Movements Neck, shoulders, hips: None, normal, Overall Severity Severity of abnormal movements (highest score from questions above): Mild Incapacitation due to abnormal movements: Minimal Patient's awareness of  abnormal movements (rate only patient's report): No Awareness, Dental Status Current problems with teeth and/or dentures?: No Does patient usually wear dentures?: No   Musculoskeletal: Strength & Muscle Tone: within normal limits Gait & Station: normal Patient leans: [name]  Psychiatric Specialty Exam: Physical Exam  Nursing note and vitals reviewed. Constitutional: He appears well-developed and well-nourished.  HENT:  Head: Normocephalic and atraumatic.  Eyes: Conjunctivae are normal. Pupils are equal, round, and reactive to light.  Neck: Normal range of motion.  Cardiovascular: Regular rhythm and normal heart sounds.   Respiratory: Effort normal.  GI: Soft.  Musculoskeletal: Normal range of motion.  Neurological: He is alert.  Skin: Skin is warm and dry.  Psychiatric: His affect is blunt. His speech is delayed. He is slowed. Thought content is not paranoid and not delusional. Cognition and memory are normal. He expresses impulsivity.    Review of Systems  Constitutional: Negative.   HENT: Negative.   Eyes: Negative.   Cardiovascular: Negative.   Gastrointestinal: Negative.   Genitourinary: Negative.   Musculoskeletal: Negative.   Skin: Negative.   Neurological: Negative.   Endo/Heme/Allergies: Negative.   Psychiatric/Behavioral: Positive for depression. Negative for hallucinations.    Blood pressure 100/67, pulse 76, temperature 98.7 F (37.1 C), temperature source Oral, resp. rate 18, height _0  (1.727 m), weight 174 lb (78.9  kg), SpO2 97 %.Body mass index is 26.46 kg/m.  General Appearance: Disheveled  Eye Contact:  Poor  Speech:  Slow  Volume:  Decreased  Mood:  Dysphoric  Affect:  Blunt  Thought Process:  Linear  Orientation:  Full (Time, Place, and Person)  Thought Content:  Paranoid Ideation  Suicidal Thoughts:  No  Homicidal Thoughts:  No  Memory:  Immediate;   Fair Recent;   Fair Remote;   Fair  Judgement:  Impaired  Insight:  Lacking  Psychomotor Activity:  Decreased  Concentration:  Concentration: Fair and Attention Span: Fair  Recall:  [employer] of Knowledge:  Fair  Language:  Good  Akathisia:  No  Handed:  Right  AIMS (if indicated):     Assets:  Housing Physical Health  ADL's:  Intact  Cognition:  WNL  Sleep:  Number of Hours: 7.25     Treatment Plan Summary:  Mr. Kritikos 41 year old male with prior diagnosis of schizoaffective disorder who presented to the emergency room with vague suicidal thoughts as well as paranoid thoughts. He does. Be responding to internal stimuli and has endorsed some auditory and visual hallucinations. He will be admitted to inpatient psychiatry for medication management, safety and stabilization.  1. Schizoaffective disorder, depressive type. He was started on Clozaril and his dose was increased to 225 mg nightly for psychosis. Clozapine level 748 most likely due to interaction with the Luvox. I tried to lower Clozapine dose but in ERROR we discontinue Clozapine altogether. Last dose was given on Monday. I ordered Clozapine level on Friday unnecessarily. I restarted 100 mg of Clozapine tonight.   2. ECT. Patient had ECT on Friday,11/13/2016 for the first time in this hospitalization. I am optimistic that this treatment may make a real difference. Unfortunately I will be away from the hospital all of next week. I am leaving the patient in the care of Dr. Mamie Nick and will sign out to her. If the patient is still in the hospital after this next week we will plan to  continue ECT treatment picking up again on July 2.   3. Metabolic syndrome monitoring. Lipid panel is normal, hemoglobin  A1c 103.  4. EKG. Normal sinus rhythm, QTc 417.  5. Questionable history of polysubstance abuse: The patient denies any history of any polysubstance use but records indicate that there are may have been a history of polysubstance use. He was advised to abstain from alcohol and all illicit drugs as it may worsen anxiety mood symptoms as well as psychosis. The patient is not interested in any substance abuse treatment.  6. Nicotine use disorder. Nicotine patch is available.   7. Diabetes. He is on Metformin, Novolog 10 units with meals and Lantus 55 units, along with ADA diet and blood sugar monitoring. Medicine input is greatly appreciated. Glucerna is discontinued.  8. Hypertension: Vital signs are stable. We'll continue lisinopril 20 mg daily.  9. Seasonal allergies: Continue Singulair 10 mg nightly  10. Anxiety/restlessness. He is on Propranolol.   11. Insomnia. We discontinued Trazodone and started Restoril. Slept 7.5 hours.  12. Back pain. We will give Robaxin.  13. Disposition: The patient will return to a group home. He will follow up with [facility] in [facility]. Consider ACT team.  Recheck clozapine level tomorrow. He had a low blood sugar transiently midday today but that seems to of been an outlier. No other change to diabetes medicine. ECT Friday. No change to psychiatric medicine. Treatment team will discuss the case tomorrow and we are still hoping for possible discharge fairly soon.    Alethia Berthold, MD 11/25/2016, 3:18 PM

## 2016-11-25 NOTE — Plan of Care (Signed)
Problem: Safety: Goal: Ability to remain free from injury will improve Outcome: Progressing Patient remains free from injury on unit.

## 2016-11-25 NOTE — Progress Notes (Signed)
Patient was visible on the unit for meals and snack but remained in room for most of shift. Patient's affect improving, speech clear. Patient stated that he shaved and showered the previous shift. Patient up for meals and medications. Denies pain, alert and responsive. Breathing unlabored and extremities x 4 within normal limits. Will continue to monitor patient's BS and notify MD of any changes.

## 2016-11-25 NOTE — Progress Notes (Signed)
DAR Note: Pt at the time of assessment was flat, isolative and withdrawn to room. Pt at the time endorsed mild anxiety and depression; states, "my depression and anxiety ate about a 3 today." Pt denied pain, SI, HI or AVH. Pt could be slow to respond to questions. Pt remained calm and cooperative. Medications offered as prescribed. All patient's questions and concerns addressed. Support, encouragement, and safe environment provided. 15-minute safety checks continue. Pt was med compliant. Safety checks continue.

## 2016-11-25 NOTE — [facility] Group Notes (Signed)
[facility] Group Notes:  (Nursing/MHT/Case Management/Adjunct)  Date:  11/25/2016  Time:  7:09 AM  Type of Therapy:  Psychoeducational Skills  Participation Level:  Did Not Attend  Summary of Progress/Problems:  Tony Cunningham 11/25/2016, 7:09 AM

## 2016-11-25 NOTE — Progress Notes (Signed)
Hypoglycemic Event  CBG: 57  Treatment: Lunch provided  Symptoms: No symptoms exhibited  Follow-up CBG: Time:1200 CBG Result: 123  Possible Reasons for Event: Decrease in po intake this morning for breakfast  Comments/MD notified: Dr. Toni Amend notified of event via telephone, no new orders given.    Tony Cunningham Tony Cunningham

## 2016-11-25 NOTE — [facility] Group Notes (Signed)
[facility] Group Notes:  (Nursing/MHT/Case Management/Adjunct)  Date:  11/25/2016  Time:  11:21 PM  Type of Therapy:  Psychoeducational Skills  Participation Level:  Minimal  Participation Quality:  Attentive and Resistant  Affect:  Appropriate  Cognitive:  Lacking  Insight:  Improving  Engagement in Group:  None  Modes of Intervention:  Discussion  Summary of Progress/Problems:  Foy [place] 11/25/2016, 11:21 PM

## 2016-11-26 ENCOUNTER — Other Ambulatory Visit: Payer: Self-pay | Admitting: Psychiatry

## 2016-11-26 LAB — GLUCOSE, CAPILLARY
GLUCOSE-CAPILLARY: 261 mg/dL — AB (ref 65–99)
GLUCOSE-CAPILLARY: 218 mg/dL — AB (ref 65–99)
Glucose-Capillary: 126 mg/dL — ABNORMAL HIGH (ref 65–99)
Glucose-Capillary: 194 mg/dL — ABNORMAL HIGH (ref 65–99)

## 2016-11-26 NOTE — Progress Notes (Signed)
Patient ID: Tony Cunningham, male   DOB: [birth year], 41 y.o.   MRN: [id number] Patient continues to improve in mood, affect and cognition; speech without slurring, appearence needs more encouragement, steady gait, denied pain, denied SI/HI, denied AV/H; CBG=71@ bedtime; Clozaril level is therapeutic, WBC, Neutrophils are all WNL.

## 2016-11-26 NOTE — Plan of Care (Signed)
Problem: Activity: Goal: Sleeping patterns will improve Outcome: Progressing Patient slept for Estimated Hours of 6.15; q15 minutes safety round maintained, no injury or falls during this shift.    

## 2016-11-26 NOTE — Progress Notes (Signed)
Recreation Therapy Notes  Date: 07.05.18 Time: 9:30 am Location: Craft Room  Group Topic: Leisure Education  Goal Area(s) Addresses:  Patient will identify things they are grateful for. Patient will identify how being grateful can influence decision making.  Behavioral Response: Did not attend  Intervention: Grateful Wheel  Activity: Patients were given an I Am Grateful For worksheet and were instructed to write things they are grateful for under each category.  Education: LRT educated patients on leisure.  Education Outcome: Patient did not attend group.  Clinical Observations/Feedback: Patient did not attend group.  Jacquelynn Cree, LRT/CTRS 11/26/2016 10:07 AM

## 2016-11-26 NOTE — Progress Notes (Signed)
Patient was visible on the unit for meals and snack, but stayed in the room most of the shift. Patient denies SI/AH/VH and pain. Patient alert and responsive, breathing unlabored and extremities within normal limits. Remains safe on the unit with q 15 minute checks.

## 2016-11-26 NOTE — Plan of Care (Signed)
Problem: Activity: Goal: Will identify at least one activity in which they can participate Outcome: Progressing Patient stated that he can play a board game in the dayroom with one of his peers  Problem: Coping: Goal: Ability to identify and develop effective coping behavior will improve Outcome: Progressing Patient's coping mechanisms has improved Goal: Ability to interact with others will improve Outcome: Progressing Patient has been observed interacting with peers appropriately. Goal: Participation in decision-making will improve Outcome: Progressing Patient has spoken with group home for placement. Goal: Ability to use eye contact when communicating with others will improve Outcome: Progressing Patient can maintain eye contact when communicating with staff for at least 1 minute.  Problem: Self-Concept: Goal: Ability to verbalize positive feelings about self will improve Outcome: Progressing Patient states, "I think I look good since I've shaved and showed."  Problem: Activity: Goal: Interest or engagement in activities will improve Outcome: Progressing Patient has been engaging more in communicating with peers in the dayroom. Goal: Sleeping patterns will improve Outcome: Not Progressing Patient continues to sleep for extended period of time during the day.

## 2016-11-26 NOTE — Progress Notes (Signed)
Inpatient Diabetes Program Recommendations  AACE/ADA: New Consensus Statement on Inpatient Glycemic Control (2015)  Target Ranges:  Prepandial:   less than 140 mg/dL      Peak postprandial:   less than 180 mg/dL (1-2 hours)      Critically ill patients:  140 - 180 mg/dL   Lab Results  Component Value Date   GLUCAP 261 (H) 11/26/2016   HGBA1C 10.3 (H) 11/09/2016    Review of Glycemic Control  Results for ESTER, MABE (MRN [id number]) as of 11/26/2016 08:25  Ref. Range 11/25/2016 11:41 11/25/2016 12:09 11/25/2016 16:31 11/25/2016 20:29 11/26/2016 06:59  Glucose-Capillary Latest Ref Range: 65 - 99 mg/dL 57 (L) 676 (H) 720 (H) 71 261 (H)    Current orders for Inpatient glycemic control: Lantus 55 units QHS, Novolog 0-15 units TID with meals, Novolog 10 units TID with meals, Metformin 1000 mg BID, Novolog 0-5 units qhs  Inpatient Diabetes Program Recommendations:   Please DO NOT hold hs Lantus  insulin dose- he is having low blood sugar because the Lantus is being held (twice this week) causing high morning blood sugars- then we're giving him high doses of Novolog and THAT IS WHAT IS CAUSING THE LOWS.   Please make sure Novolog insulin is dosed and given within 1 hour of the blood sugar being checked to avoid low blood sugars.   Susette Racer, RN, BA, MHA, CDE Diabetes Coordinator Inpatient Diabetes Program  [phone] (Team Pager) [phone] [facility]) 11/26/2016 8:30 AM

## 2016-11-26 NOTE — Progress Notes (Signed)
[facility] MD Progress Note  11/26/2016 7:34 PM Hughes Wyndham  MRN:  [id number]  Subjective:  Mr. Smithey is a 41 year old male with prior diagnosis of schizoaffective disorder. He received ECT to be continued next week if appropriate.  He was started on Clozapine and his dose was titrated to 225 mg. He is also on Luvox. Clozapine level was quite high with resulting sedation, because of the combination. Unfortunately in ERROR his Clozapine was discotoniued after Monday. I restarted 100 mg of Clozapine tonight. Actually, he is doing well without it, less sedated. Not knowing he was off Clozapine I took another level Thursday night.  11/08/16. The patient has been very isolative and withdrawn. Hygiene is poor and he is malodorous. He has not showered. He did go outside with the other patients and has been getting up for meals but not attending groups or interacting with staff or peers. He denies any current active or passive suicidal thoughts but does admit to some very vague paranoid thoughts that others are watching him. He is very limited and talking with this Probation officer and wanted to be "left alone to sleep". He denied any current somatic complaints. Vital signs are stable and he slept fairly well last night but is also sleeping partially during the daytime. So far, he is tolerating the Clozaril fairly well without any physical adverse side effects.  Follow-up for this 41 year old man with what appears to be psychotic depression or schizoaffective disorder. Patient remains in bed most of the time. Poorly interactive. Mood is depressed. Passive suicidal thoughts.  Follow-up for Thursday the 21st. Patient remains withdrawn very little activity. Blood sugars are also running much higher. Patient continues to endorse feeling depressed and having vague suicidal thoughts.  Follow-up for Friday the 22nd. Patient finally had ECT this morning bilateral treatment which went off without any difficulty or complication. I saw him  this afternoon and I think he actually was a little more interactive and verbal and energetic than before. This may be wishful thinking but it makes me optimistic. No specific complaints other than headache  Patient was seen today for follow-up. Patient has no new complaints. Continues to say his thoughts are racing but his affect is flat and withdrawn he is almost completely withdrawn from other people and stays in his room by himself. Suicidal thoughts passive no intention or plan. Still has psychotic symptoms. Blood sugars unfortunately continued to run quite high.  6/23 patient denies having any issues or concerns today. Says that he has been eating and sleeping well. Denies side effects from medications or any physical complaints. Denies suicidality or homicidality. He says that he might be hallucinating but he is not sure. He said that he had 1 ECT procedure and it went well.   Blood glucose this morning was 92. Patient is compliant with medications. No major events overnight per nursing.  6/24 patient did not sleep well last night. Other than that he denies having any issues or concerns. He denies suicidality, homicidality or auditory visual hallucinations. During examination he appears much brighter and more conversational. His thought processes are linear.  11/16/2016. Mr. Langhorst does not like talking to me today. He is in his room he is in bed He does not open his eyes for me and is unable to keep a conversation he keeps telling me okay okay and is unwilling to answer any questions. He received just one ECT treatment last week. Since Dr. Weber Cooks is on vacation, ECT will resume next week.  He  accepts medications and seems to tolerate them well Clozaril level is over 700. This is probably from a combination of clozapine and Luvox. I will alert dose of clozapine to 200 mg  To avoid side effects. I will switch to Luvox to 1 nightly dose to improve sleep.  11/17/2016. [name] is much more animated and  interactive today. He is in his room in bed bugs is able to hold a conversation and maintains good eye contact. He complains of stomach ache. He did have a bowel movement this morning. His mood is still depressed and he needs much encouragement to attend to his ADLs. Staff needs to strongly encourage showering. He has high hopes for ECT and will not stay in the hospital until next week to resume ECT with Dr. Weber Cooks Is back from vacation. Yesterday he is agreeable to the nighttime and clozapine dose decrease due to high clozapine level. This could have helped the patient stated more awake this morning. He slept almost 6 hours.  11/18/2016. [name] seems more relaxed and talkative today. He is still very flat on his back but opens his eyes for me to we are having a small conversation. He has not been sleeping well. There are in spite of medication adjustments. He believes that trazodone works well for him. I will start trazodone tonight. She no longer complains of stomach pain. He does not participate in programming but takes medications with no problem. She met with the group home representative this week and was accepted to a group home with a bed open at the beginning of July. When asked returns from vacation, he will decide whether to continue ECT or whether the patient will be discharged.  11/19/2016. [name] seems better today. This morning he has been up and walking around. He complains of "tension" in his head both mental and physical. He slept 3 hours only again in spite of multiple sedating medications given at night including Trazodone. I will switch him to Restoril tonight. It may need to be discontinued if her restarts ECT next week. No other somatic complaints. Sugers still elevated at times. I changed diet to ADA and discontinued Glucerna. Input from diabetes nurse coordinator and medicine consultant is greatly appreciated. He does not interact with staff/peers or participate in programing. Hygiene  is still questionable.  11/20/2016. [name] is up and about today. He comes to my office for interview. He seems more relaxed today. Still very vague in his answers. Complains of back and neck pain but has been in bed on his back most of the week. His sugar was low today as he was switched to ADA diet and Glucerna was discontinued upon [facility] suggestion. He is looking forward to see Dr. Weber Cooks "who is the best". He corrects himself to tell me that I am not bad either.  Follow-up for June 30. Patient seen. Spoke with nursing. Patient continues to report feeling bad. Mood feels depressed. Denies acute suicidal thoughts but feels hopeless about the future. Feels paranoid and vaguely report psychosis. Patient continues to have poor self-care and poor interaction with others on the unit. Physically he seems to be stabilizing a little.  Follow-up Sunday, July 1. Patient seen. He looks a little better groomed today. He has been up out of his bed although he does not attend any groups. He is ambivalent about his mood today. Denies acute hallucinations or suicidal thinking. Still feels hopeless about discharge.  Patient seen for follow-up on Monday, July 2. He  had ECT treatment this morning which was complicated by some delirium and agitation during the recovery phase. Otherwise treatment went fine. Seen this afternoon the patient is still a little groggy from the medicines he received. No specific physical complaints however. Mood is still depressed and he is still slow and cognitively impaired. Denies acute suicidal intent. Sugars continue to be very labile. He is requesting a restart of his Glucerna but I am unsure whether that is appropriate.  [place] Tuesday, July 3. Patient has no new complaints. In fact today he seems to think he is doing a little bit better. His affect looked brighter and he was more conversant with me. He is taking some steps to improve his appearance.  Follow-up for Wednesday, July 4.  Patient seen. His only complaint today is wanting to have the dietary supplement Glucerna shake back. He thinks his mood is feeling better. He lets me know that he has been out of bed watching TV and being around people. Hallucinations are minimal. Denies suicidal thoughts. Admits that he is feeling a little more optimistic about going home with his family.  Follow-up for Thursday, July 5. Patient says he is feeling a little better than yesterday. Still feels anxious much of the time. He has made the effort to go to groups a little bit. Thought still are scattered and a little disorganized. Stays withdrawn most of the time. Denies however having active suicidal thoughts. Sugars continue to be labile but nothing terrible  Per nursing: Pt calm, cooperative and pleasant.  AM BS 149.  Speech is more clear and logical, though remains hypoverbal.  Complains of muscle pain in neck starting approx. 1 week ago.  PRN acetaminophen offered and eventually accepted.  Reports moderate relief.  Remains frequently isolative to room.  No behavioral issues.    Principal Problem: Schizoaffective disorder, depressive type ([place]) Diagnosis:   Patient Active Problem List   Diagnosis Date Noted  . Schizoaffective disorder, depressive type ([place]) [F25.1] 09/23/2016  . Polysubstance abuse [F19.10] 07/17/2016  . Tardive dyskinesia [G24.01] 07/16/2016  . Tobacco use disorder [F17.200] 07/07/2016  . Dyslipidemia [E78.5] 07/07/2016  . Asthma [J45.909] 07/07/2016  . HTN (hypertension) [I10] 07/06/2016  . Diabetes ([place]) [E11.9] 12/25/2010   Total Time spent with patient: 20 minutes    Past Medical History:  Past Medical History:  Diagnosis Date  . Anxiety   . Asthma   . Diabetes mellitus   . High blood pressure   . Sinus complaint    History reviewed. No pertinent surgical history.  Social History:  History  Alcohol Use No     History  Drug Use No    Social History   Social History  . Marital status:  Single    Spouse name: [name]  . Number of children: [name]  . Years of education: [name]   Social History Main Topics  . Smoking status: Current Every Day Smoker    Packs/day: 0.50    Types: Cigarettes  . Smokeless tobacco: Never Used  . Alcohol use No  . Drug use: No  . Sexual activity: Not Currently   Other Topics Concern  . None   Social History Narrative  . None   Additional Social History:                         Sleep: Good  Appetite:  Good  Current Medications: Current Facility-Administered Medications  Medication Dose Route Frequency Provider Last Rate Last Dose  .  acetaminophen (TYLENOL) tablet 650 mg  650 mg Oral Q6H PRN [name], Madie Reno, MD   650 mg at 11/19/16 1739  . alum & mag hydroxide-simeth (MAALOX/MYLANTA) 200-200-20 MG/5ML suspension 30 mL  30 mL Oral Q4H PRN [name], [name], MD      . cloZAPine (CLOZARIL) tablet 100 mg  100 mg Oral QHS [name], [name], MD   100 mg at 11/25/16 2135  . dextrose 5 %-0.45 % sodium chloride infusion   Intravenous Continuous [name], Madie Reno, MD 10 mL/hr at 11/13/16 1009    . feeding supplement (GLUCERNA SHAKE) (GLUCERNA SHAKE) liquid 237 mL  237 mL Oral TID WC [name], [name], MD   237 mL at 11/26/16 1701  . fentaNYL (SUBLIMAZE) injection 25 mcg  25 mcg Intravenous Q5 min PRN Gunnar Bulla, MD      . fluvoxaMINE (LUVOX) tablet 100 mg  100 mg Oral QHS [name], [name], MD   100 mg at 11/25/16 2135  . insulin aspart (novoLOG) injection 0-15 Units  0-15 Units Subcutaneous TID WC Loletha Grayer, MD   2 Units at 11/26/16 1700  . insulin aspart (novoLOG) injection 0-5 Units  0-5 Units Subcutaneous QHS Loletha Grayer, MD   3 Units at 11/24/16 2155  . insulin aspart (novoLOG) injection 10 Units  10 Units Subcutaneous TID WC Fritzi Mandes, MD   10 Units at 11/26/16 1700  . insulin glargine (LANTUS) injection 55 Units  55 Units Subcutaneous QHS Fritzi Mandes, MD   55 Units at 11/24/16 2155  . lisinopril (PRINIVIL,ZESTRIL)  tablet 20 mg  20 mg Oral Daily [name], Madie Reno, MD   20 mg at 11/26/16 0839  . magnesium hydroxide (MILK OF MAGNESIA) suspension 30 mL  30 mL Oral Daily PRN [name], [name], MD      . meloxicam (MOBIC) tablet 7.5 mg  7.5 mg Oral BID PC [name], [name], MD   7.5 mg at 11/26/16 1701  . metFORMIN (GLUCOPHAGE) tablet 1,000 mg  1,000 mg Oral BID WC [name], Madie Reno, MD   1,000 mg at 11/26/16 1657  . montelukast (SINGULAIR) tablet 10 mg  10 mg Oral QHS [name], Madie Reno, MD   10 mg at 11/25/16 2135  . nicotine (NICODERM CQ - dosed in mg/24 hr) patch 7 mg  7 mg Transdermal Daily Chauncey Mann, MD   7 mg at 11/26/16 0835  . ondansetron (ZOFRAN) injection 4 mg  4 mg Intravenous Once PRN Gunnar Bulla, MD      . pantoprazole (PROTONIX) EC tablet 40 mg  40 mg Oral Daily [name], [name], MD   40 mg at 11/26/16 0836  . polyethylene glycol (MIRALAX / GLYCOLAX) packet 17 g  17 g Oral Daily [name], Madie Reno, MD   17 g at 11/26/16 0835  . promethazine (PHENERGAN) tablet 12.5 mg  12.5 mg Oral Q6H PRN [name], [name], MD      . propranolol (INDERAL) tablet 20 mg  20 mg Oral BID [name], Madie Reno, MD   20 mg at 11/26/16 1657  . simvastatin (ZOCOR) tablet 40 mg  40 mg Oral q1800 [name], Madie Reno, MD   40 mg at 11/26/16 1701  . temazepam (RESTORIL) capsule 15 mg  15 mg Oral QHS [name], [name], MD   15 mg at 11/25/16 2135    Lab Results:  Results for orders placed or performed during the hospital encounter of 11/06/16 (from the past 48 hour(s))  Glucose, capillary     Status:  Abnormal   Collection Time: 11/24/16  9:43 PM  Result Value Ref Range   Glucose-Capillary 309 (H) 65 - 99 mg/dL  Glucose, capillary     Status: Abnormal   Collection Time: 11/24/16  9:44 PM  Result Value Ref Range   Glucose-Capillary 287 (H) 65 - 99 mg/dL  Glucose, capillary     Status: Abnormal   Collection Time: 11/25/16  6:45 AM  Result Value Ref Range   Glucose-Capillary 315 (H) 65 - 99 mg/dL  Glucose, capillary     Status:  Abnormal   Collection Time: 11/25/16 11:41 AM  Result Value Ref Range   Glucose-Capillary 57 (L) 65 - 99 mg/dL   Comment 1 Notify RN   Glucose, capillary     Status: Abnormal   Collection Time: 11/25/16 12:09 PM  Result Value Ref Range   Glucose-Capillary 123 (H) 65 - 99 mg/dL  Glucose, capillary     Status: Abnormal   Collection Time: 11/25/16  4:31 PM  Result Value Ref Range   Glucose-Capillary 139 (H) 65 - 99 mg/dL  Glucose, capillary     Status: None   Collection Time: 11/25/16  8:29 PM  Result Value Ref Range   Glucose-Capillary 71 65 - 99 mg/dL  Glucose, capillary     Status: Abnormal   Collection Time: 11/26/16  6:59 AM  Result Value Ref Range   Glucose-Capillary 261 (H) 65 - 99 mg/dL  Glucose, capillary     Status: Abnormal   Collection Time: 11/26/16 11:35 AM  Result Value Ref Range   Glucose-Capillary 218 (H) 65 - 99 mg/dL  Glucose, capillary     Status: Abnormal   Collection Time: 11/26/16  4:37 PM  Result Value Ref Range   Glucose-Capillary 126 (H) 65 - 99 mg/dL    Blood Alcohol level:  Lab Results  Component Value Date   ETH <5 10/21/2016   ETH <5 16/02/9603    Metabolic Disorder Labs: Lab Results  Component Value Date   HGBA1C 10.3 (H) 11/09/2016   MPG 249 11/09/2016   MPG 275 10/09/2016   Lab Results  Component Value Date   PROLACTIN 24.5 (H) 09/24/2016   PROLACTIN 3.4 (L) 07/07/2016   Lab Results  Component Value Date   CHOL 92 11/09/2016   TRIG 54 11/09/2016   HDL 39 (L) 11/09/2016   CHOLHDL 2.4 11/09/2016   VLDL 11 11/09/2016   LDLCALC 42 11/09/2016   LDLCALC 72 10/09/2016    Physical Findings: AIMS: Facial and Oral Movements Muscles of Facial Expression: Mild Lips and Perioral Area: Mild Jaw: Mild Tongue: Minimal,Extremity Movements Upper (arms, wrists, hands, fingers): None, normal Lower (legs, knees, ankles, toes): Minimal, Trunk Movements Neck, shoulders, hips: None, normal, Overall Severity Severity of abnormal movements  (highest score from questions above): Mild Incapacitation due to abnormal movements: Minimal Patient's awareness of abnormal movements (rate only patient's report): No Awareness, Dental Status Current problems with teeth and/or dentures?: No Does patient usually wear dentures?: No   Musculoskeletal: Strength & Muscle Tone: within normal limits Gait & Station: normal Patient leans: [name]  Psychiatric Specialty Exam: Physical Exam  Nursing note and vitals reviewed. Constitutional: He appears well-developed and well-nourished.  HENT:  Head: Normocephalic and atraumatic.  Eyes: Conjunctivae are normal. Pupils are equal, round, and reactive to light.  Neck: Normal range of motion.  Cardiovascular: Regular rhythm and normal heart sounds.   Respiratory: Effort normal.  GI: Soft.  Musculoskeletal: Normal range of motion.  Neurological: He is alert.  Skin: Skin is warm and dry.  Psychiatric: His affect is blunt. His speech is delayed. He is slowed. Thought content is not paranoid and not delusional. Cognition and memory are normal. He expresses impulsivity.    Review of Systems  Constitutional: Negative.   HENT: Negative.   Eyes: Negative.   Cardiovascular: Negative.   Gastrointestinal: Negative.   Genitourinary: Negative.   Musculoskeletal: Negative.   Skin: Negative.   Neurological: Negative.   Endo/Heme/Allergies: Negative.   Psychiatric/Behavioral: Positive for depression. Negative for hallucinations.    Blood pressure 118/70, pulse 75, temperature 98 F (36.7 C), temperature source Oral, resp. rate 18, height _0  (1.727 m), weight 78.9 kg (174 lb), SpO2 100 %.Body mass index is 26.46 kg/m.  General Appearance: Disheveled  Eye Contact:  Poor  Speech:  Slow  Volume:  Decreased  Mood:  Dysphoric  Affect:  Blunt  Thought Process:  Linear  Orientation:  Full (Time, Place, and Person)  Thought Content:  Paranoid Ideation  Suicidal Thoughts:  No  Homicidal Thoughts:  No   Memory:  Immediate;   Fair Recent;   Fair Remote;   Fair  Judgement:  Impaired  Insight:  Lacking  Psychomotor Activity:  Decreased  Concentration:  Concentration: Fair and Attention Span: Fair  Recall:  [employer] of Knowledge:  Fair  Language:  Good  Akathisia:  No  Handed:  Right  AIMS (if indicated):     Assets:  Housing Physical Health  ADL's:  Intact  Cognition:  WNL  Sleep:  Number of Hours: 6.15     Treatment Plan Summary:  Mr. Ryner 41 year old male with prior diagnosis of schizoaffective disorder who presented to the emergency room with vague suicidal thoughts as well as paranoid thoughts. He does. Be responding to internal stimuli and has endorsed some auditory and visual hallucinations. He will be admitted to inpatient psychiatry for medication management, safety and stabilization.  1. Schizoaffective disorder, depressive type. He was started on Clozaril and his dose was increased to 225 mg nightly for psychosis. Clozapine level 748 most likely due to interaction with the Luvox. I tried to lower Clozapine dose but in ERROR we discontinue Clozapine altogether. Last dose was given on Monday. I ordered Clozapine level on Friday unnecessarily. I restarted 100 mg of Clozapine tonight.   2. ECT. Patient had ECT on Friday,11/13/2016 for the first time in this hospitalization. I am optimistic that this treatment may make a real difference. Unfortunately I will be away from the hospital all of next week. I am leaving the patient in the care of Dr. Mamie Nick and will sign out to her. If the patient is still in the hospital after this next week we will plan to continue ECT treatment picking up again on July 2.   3. Metabolic syndrome monitoring. Lipid panel is normal, hemoglobin A1c 103.  4. EKG. Normal sinus rhythm, QTc 417.  5. Questionable history of polysubstance abuse: The patient denies any history of any polysubstance use but records indicate that there are may have been a history  of polysubstance use. He was advised to abstain from alcohol and all illicit drugs as it may worsen anxiety mood symptoms as well as psychosis. The patient is not interested in any substance abuse treatment.  6. Nicotine use disorder. Nicotine patch is available.   7. Diabetes. He is on Metformin, Novolog 10 units with meals and Lantus 55 units, along with ADA diet and blood sugar monitoring. Medicine input is greatly appreciated. Glucerna  is discontinued.  8. Hypertension: Vital signs are stable. We'll continue lisinopril 20 mg daily.  9. Seasonal allergies: Continue Singulair 10 mg nightly  10. Anxiety/restlessness. He is on Propranolol.   11. Insomnia. We discontinued Trazodone and started Restoril. Slept 7.5 hours.  12. Back pain. We will give Robaxin.  13. Disposition: The patient will return to a group home. He will follow up with [facility] in [facility]. Consider ACT team.  I think it is still appropriate to continue with plan for ECT tomorrow. Patient is agreeable to this. We are hoping that we may be able to get him out of the hospital next week if he will be able to stay with family. No other change to medicine for now.    Alethia Berthold, MD 11/26/2016, 7:34 PM

## 2016-11-26 NOTE — [facility] Group Notes (Signed)
[facility] LCSW Group Therapy   11/26/2016 1pm   Type of Therapy: Group Therapy   Participation Level: Pt invited but did not attend.   Hampton Abbot, MSW, LCSW-A 11/26/2016, 3:51PM

## 2016-11-26 NOTE — [facility] Group Notes (Signed)
[facility] LCSW Group Therapy Note  Type of Therapy and Topic:  Group Therapy:  Goals Group: SMART Goals  Participation Level: Patient did not attend group. CSW invited patient to group.   Description of Group:   The purpose of a daily goals group is to assist and guide patients in setting recovery/wellness-related goals.  The objective is to set goals as they relate to the crisis in which they were admitted. Patients will be using SMART goal modalities to set measurable goals.  Characteristics of realistic goals will be discussed and patients will be assisted in setting and processing how one will reach their goal. Facilitator will also assist patients in applying interventions and coping skills learned in psycho-education groups to the SMART goal and process how one will achieve defined goal.  Therapeutic Goals: -Patients will develop and document one goal related to or their crisis in which brought them into treatment. -Patients will be guided by LCSW using SMART goal setting modality in how to set a measurable, attainable, realistic and time sensitive goal.  -Patients will process barriers in reaching goal. -Patients will process interventions in how to overcome and successful in reaching goal.   Summary of Patient Progress:  Patient Goal:  None identified at this time.   Therapeutic Modalities:   Motivational Interviewing Engineer, manufacturing systems Therapy Crisis Intervention Model SMART goals setting  Tony Cunningham Tony Cunningham MSW, LCSWA 11/26/2016 2:00 PM

## 2016-11-27 ENCOUNTER — Inpatient Hospital Stay: Payer: Medicare Other | Admitting: Anesthesiology

## 2016-11-27 ENCOUNTER — Encounter: Payer: Self-pay | Admitting: *Deleted

## 2016-11-27 ENCOUNTER — Other Ambulatory Visit: Payer: Self-pay | Admitting: Psychiatry

## 2016-11-27 LAB — GLUCOSE, CAPILLARY
GLUCOSE-CAPILLARY: 248 mg/dL — AB (ref 65–99)
GLUCOSE-CAPILLARY: 126 mg/dL — AB (ref 65–99)
GLUCOSE-CAPILLARY: 115 mg/dL — AB (ref 65–99)
GLUCOSE-CAPILLARY: 374 mg/dL — AB (ref 65–99)
Glucose-Capillary: 118 mg/dL — ABNORMAL HIGH (ref 65–99)

## 2016-11-27 MED ORDER — METHOHEXITAL SODIUM 100 MG/10ML IV SOSY
PREFILLED_SYRINGE | INTRAVENOUS | Status: DC | PRN
  Administered 2016-11-27: 80 mg via INTRAVENOUS

## 2016-11-27 MED ORDER — SODIUM CHLORIDE 0.9 % IV SOLN
500.0000 mL | Freq: Once | INTRAVENOUS | Status: AC
  Administered 2016-11-27: 500 mL via INTRAVENOUS

## 2016-11-27 MED ORDER — MIDAZOLAM HCL 2 MG/2ML IJ SOLN
INTRAMUSCULAR | Status: AC
  Filled 2016-11-27: qty 2

## 2016-11-27 MED ORDER — SUCCINYLCHOLINE CHLORIDE 200 MG/10ML IV SOSY
PREFILLED_SYRINGE | INTRAVENOUS | Status: DC | PRN
  Administered 2016-11-27: 100 mg via INTRAVENOUS

## 2016-11-27 MED ORDER — HALOPERIDOL LACTATE 5 MG/ML IJ SOLN
INTRAMUSCULAR | Status: AC
  Filled 2016-11-27: qty 1

## 2016-11-27 MED ORDER — SUCCINYLCHOLINE CHLORIDE 20 MG/ML IJ SOLN
INTRAMUSCULAR | Status: AC
  Filled 2016-11-27: qty 1

## 2016-11-27 MED ORDER — HALOPERIDOL LACTATE 5 MG/ML IJ SOLN
INTRAMUSCULAR | Status: DC | PRN
  Administered 2016-11-27: 5 mg via INTRAVENOUS

## 2016-11-27 MED ORDER — SODIUM CHLORIDE 0.9 % IV SOLN
500.0000 mL | Freq: Once | INTRAVENOUS | Status: AC
  Administered 2016-11-27: 11:00:00 via INTRAVENOUS

## 2016-11-27 MED ORDER — MIDAZOLAM HCL 2 MG/2ML IJ SOLN
2.0000 mg | Freq: Once | INTRAMUSCULAR | Status: AC
  Administered 2016-11-27 (×2): 2 mg via INTRAVENOUS

## 2016-11-27 NOTE — Progress Notes (Signed)
D: "Do you really think I look better?" Pt displays a brighter affect with some smiling at staff. Pt aware he is NPO after midnoc for ECT in am. Pt maintained safety while monitored on 15 minute safety checks.   A: Pt given positive feedback for his improvements.  Allowed staff to assist him to shave his beard.  R: Pt NPO after midnight.  Monitor safety and cont tx plan.

## 2016-11-27 NOTE — Anesthesia Postprocedure Evaluation (Signed)
Anesthesia Post Note  Patient: Tony Cunningham  Procedure(s) Performed: * No procedures listed *  Patient location during evaluation: PACU Anesthesia Type: General Level of consciousness: awake and alert Pain management: pain level controlled Vital Signs Assessment: post-procedure vital signs reviewed and stable Respiratory status: spontaneous breathing, nonlabored ventilation, respiratory function stable and patient connected to nasal cannula oxygen Cardiovascular status: blood pressure returned to baseline and stable Postop Assessment: no signs of nausea or vomiting Anesthetic complications: no     Last Vitals:  Vitals:   11/27/16 1056 11/27/16 1236  BP: 110/75 123/75  Pulse: 70 77  Resp: 18 16  Temp: 36.8 C (!) 36.2 C    Last Pain:  Vitals:   11/27/16 1056  TempSrc: Oral  PainSc:                  Lynore Coscia S

## 2016-11-27 NOTE — Progress Notes (Signed)
Recreation Therapy Notes  Date: 07.06.18 Time: 9:30 am Location: Craft Room  Group Topic: Coping Skills  Goal Area(s) Addresses:  Patient will verbalize at least one emotion while participating in group. Patient will verbalize benefit of using art as a coping skill.  Behavioral Response: Did not attend  Intervention: Coloring  Activity: Patients were given coloring sheets to color and were instructed to think about what emotions they were feeling and what their minds were focused on.  Education: LRT educated patients on healthy coping skills.  Education Outcome: Patient did not attend group.   Clinical Observations/Feedback: Patient did not attend group.  Jacquelynn Cree, LRT/CTRS 11/27/2016 10:29 AM

## 2016-11-27 NOTE — Progress Notes (Signed)
Inpatient Diabetes Program Recommendations  AACE/ADA: New Consensus Statement on Inpatient Glycemic Control (2015)  Target Ranges:  Prepandial:   less than 140 mg/dL      Peak postprandial:   less than 180 mg/dL (1-2 hours)      Critically ill patients:  140 - 180 mg/dL   Results for Tony Cunningham, Tony Cunningham (MRN [id number]) as of 11/27/2016 08:18  Ref. Range 11/26/2016 06:59 11/26/2016 11:35 11/26/2016 16:37 11/26/2016 20:07  Glucose-Capillary Latest Ref Range: 65 - 99 mg/dL 741 (H)  12 units Novolog total 218 (H)  15 units Novolog total 126 (H)  12 units Novolog total 194 (H)  55 units Lantus    Results for Tony Cunningham, Tony Cunningham (MRN [id number]) as of 11/27/2016 08:18  Ref. Range 11/27/2016 06:42  Glucose-Capillary Latest Ref Range: 65 - 99 mg/dL 094 (H)    Home DM Meds: Lantus 42 units QHS       Novolog 5 units TID with meals       Metformin 1000 mg BID  Current Insulin Orders: Lantus 55 units QHS      Novolog Moderate Correction Scale/ SSI (0-15 units) TID AC + HS      Novolog 10 units TID with meals      Metformin 1000 mg BID     MD- Please consider the following in-hospital insulin adjustments:  1. Increase Lantus slightly to 58 units QHS  2. Increase Novolog Meal Coverage to: Novolog 12 units TID with meals (hold if pt eats <50% of meal)     --Will follow patient during hospitalization--  Ambrose Finland RN, MSN, CDE Diabetes Coordinator Inpatient Glycemic Control Team Team Pager: [phone] (8a-5p)

## 2016-11-27 NOTE — Tx Team (Signed)
Interdisciplinary Treatment and Diagnostic Plan Update  11/27/2016 Time of Session: 2:00pm Tony Cunningham MRN: [id number]  Principal Diagnosis: Schizoaffective disorder, depressive type (HCC)  Secondary Diagnoses: Principal Problem:   Schizoaffective disorder, depressive type (HCC) Active Problems:   Diabetes (HCC)   HTN (hypertension)   Tobacco use disorder   Dyslipidemia   Asthma   Tardive dyskinesia   Polysubstance abuse   Current Medications:  Current Facility-Administered Medications  Medication Dose Route Frequency Provider Last Rate Last Dose  . acetaminophen (TYLENOL) tablet 650 mg  650 mg Oral Q6H PRN [name], [name], MD   650 mg at 11/19/16 1739  . alum & mag hydroxide-simeth (MAALOX/MYLANTA) 200-200-20 MG/5ML suspension 30 mL  30 mL Oral Q4H PRN [name], [name], MD      . cloZAPine (CLOZARIL) tablet 100 mg  100 mg Oral QHS [name], [name], MD   100 mg at 11/26/16 2130  . dextrose 5 %-0.45 % sodium chloride infusion   Intravenous Continuous [name], [name], MD 10 mL/hr at 11/13/16 1009    . feeding supplement (GLUCERNA SHAKE) (GLUCERNA SHAKE) liquid 237 mL  237 mL Oral TID WC [name], [name], MD   237 mL at 11/26/16 1701  . fentaNYL (SUBLIMAZE) injection 25 mcg  25 mcg Intravenous Q5 min PRN Berdine Addison, MD      . fluvoxaMINE (LUVOX) tablet 100 mg  100 mg Oral QHS [name], [name], MD   100 mg at 11/26/16 2130  . insulin aspart (novoLOG) injection 0-15 Units  0-15 Units Subcutaneous TID WC Alford Highland, MD   2 Units at 11/26/16 1700  . insulin aspart (novoLOG) injection 0-5 Units  0-5 Units Subcutaneous QHS Alford Highland, MD   3 Units at 11/24/16 2155  . insulin aspart (novoLOG) injection 10 Units  10 Units Subcutaneous TID WC Enedina Finner, MD   10 Units at 11/26/16 1700  . insulin glargine (LANTUS) injection 55 Units  55 Units Subcutaneous QHS Enedina Finner, MD   55 Units at 11/26/16 2130  . lisinopril (PRINIVIL,ZESTRIL) tablet 20 mg  20 mg Oral Daily  [name], [name], MD   20 mg at 11/27/16 0644  . magnesium hydroxide (MILK OF MAGNESIA) suspension 30 mL  30 mL Oral Daily PRN [name], [name], MD      . meloxicam (MOBIC) tablet 7.5 mg  7.5 mg Oral BID PC [name], [name], MD   7.5 mg at 11/26/16 1701  . metFORMIN (GLUCOPHAGE) tablet 1,000 mg  1,000 mg Oral BID WC [name], [name], MD   1,000 mg at 11/26/16 1657  . montelukast (SINGULAIR) tablet 10 mg  10 mg Oral QHS [name], [name], MD   10 mg at 11/26/16 2130  . nicotine (NICODERM CQ - dosed in mg/24 hr) patch 7 mg  7 mg Transdermal Daily Darliss Ridgel, MD   7 mg at 11/26/16 0835  . ondansetron (ZOFRAN) injection 4 mg  4 mg Intravenous Once PRN Berdine Addison, MD      . pantoprazole (PROTONIX) EC tablet 40 mg  40 mg Oral Daily [name], [name], MD   40 mg at 11/27/16 0644  . polyethylene glycol (MIRALAX / GLYCOLAX) packet 17 g  17 g Oral Daily [name], [name], MD   17 g at 11/26/16 0835  . promethazine (PHENERGAN) tablet 12.5 mg  12.5 mg Oral Q6H PRN [name], [name], MD      . propranolol (INDERAL) tablet 20 mg  20 mg Oral BID [name], [name],  MD   20 mg at 11/26/16 1657  . simvastatin (ZOCOR) tablet 40 mg  40 mg Oral q1800 [name], [name], MD   40 mg at 11/26/16 1701  . temazepam (RESTORIL) capsule 15 mg  15 mg Oral QHS [name], [name], MD   15 mg at 11/26/16 2130   PTA Medications: Prescriptions Prior to Admission  Medication Sig Dispense Refill Last Dose  . cloZAPine (CLOZARIL) 100 MG tablet Take 1 tablet (100 mg total) by mouth daily. Daily at 1700 30 tablet 0 11/12/2016  . cyclobenzaprine (FLEXERIL) 10 MG tablet Take 1 tablet (10 mg total) by mouth 3 (three) times daily as needed for muscle spasms. 60 tablet 0 11/12/2016  . fluvoxaMINE (LUVOX) 50 MG tablet Take 1 tablet (50 mg total) by mouth daily. Daily at 1700 30 tablet 0 11/12/2016  . insulin aspart (NOVOLOG) 100 UNIT/ML injection Inject 5 Units into the skin 3 (three) times daily with meals. 5 mL 0 11/12/2016  . insulin  glargine (LANTUS) 100 UNIT/ML injection Inject 0.42 mLs (42 Units total) into the skin at bedtime. 13 mL 0 11/12/2016  . ipratropium (ATROVENT) 0.06 % nasal spray Place 1 spray into both nostrils daily after supper. 3 mL 0 11/12/2016  . lidocaine (LIDODERM) 5 % Place 1 patch onto the skin daily. Remove & Discard patch within 12 hours or as directed by MD 30 patch 0 11/12/2016  . lisinopril (PRINIVIL,ZESTRIL) 20 MG tablet Take 1 tablet (20 mg total) by mouth daily. 30 tablet 0 11/12/2016  . meloxicam (MOBIC) 7.5 MG tablet Take 1 tablet (7.5 mg total) by mouth 2 (two) times daily. 60 tablet 0 11/12/2016  . metFORMIN (GLUCOPHAGE) 1000 MG tablet Take 1 tablet (1,000 mg total) by mouth 2 (two) times daily with a meal. 60 tablet 0 11/12/2016  . montelukast (SINGULAIR) 10 MG tablet Take 1 tablet (10 mg total) by mouth at bedtime. For Asthma 30 tablet 0 11/12/2016  . polyethylene glycol (MIRALAX / GLYCOLAX) packet Take 17 g by mouth daily. 30 each 0 11/12/2016  . propranolol (INDERAL) 20 MG tablet Take 1 tablet (20 mg total) by mouth 2 (two) times daily. 60 tablet 0 11/13/2016 at Unknown time  . simvastatin (ZOCOR) 40 MG tablet Take 1 tablet (40 mg total) by mouth at bedtime. For high cholesterol 15 tablet 0 11/12/2016    Patient Stressors:    Patient Strengths:    Treatment Modalities: Medication Management, Group therapy, Case management,  1 to 1 session with clinician, Psychoeducation, Recreational therapy.   Physician Treatment Plan for Primary Diagnosis: Schizoaffective disorder, depressive type (HCC) Long Term Goal(s): Improvement in symptoms so as ready for discharge Improvement in symptoms so as ready for discharge   Short Term Goals: Ability to verbalize feelings will improve Ability to demonstrate self-control will improve Ability to identify and develop effective coping behaviors will improve Ability to identify and develop effective coping behaviors will improve Ability to identify triggers  associated with substance abuse/mental health issues will improve  Medication Management: Evaluate patient's response, side effects, and tolerance of medication regimen.  Therapeutic Interventions: 1 to 1 sessions, Unit Group sessions and Medication administration.  Evaluation of Outcomes: Progressing  Physician Treatment Plan for Secondary Diagnosis: Principal Problem:   Schizoaffective disorder, depressive type (HCC) Active Problems:   Diabetes (HCC)   HTN (hypertension)   Tobacco use disorder   Dyslipidemia   Asthma   Tardive dyskinesia   Polysubstance abuse  Long Term Goal(s): Improvement in symptoms so as ready  for discharge Improvement in symptoms so as ready for discharge   Short Term Goals: Ability to verbalize feelings will improve Ability to demonstrate self-control will improve Ability to identify and develop effective coping behaviors will improve Ability to identify and develop effective coping behaviors will improve Ability to identify triggers associated with substance abuse/mental health issues will improve     Medication Management: Evaluate patient's response, side effects, and tolerance of medication regimen.  Therapeutic Interventions: 1 to 1 sessions, Unit Group sessions and Medication administration.  Evaluation of Outcomes: Progressing   RN Treatment Plan for Primary Diagnosis: Schizoaffective disorder, depressive type (HCC) Long Term Goal(s): Knowledge of disease and therapeutic regimen to maintain health will improve  Short Term Goals: Ability to identify and develop effective coping behaviors will improve and Compliance with prescribed medications will improve  Medication Management: RN will administer medications as ordered by provider, will assess and evaluate patient's response and provide education to patient for prescribed medication. RN will report any adverse and/or side effects to prescribing provider.  Therapeutic Interventions: 1 on 1  counseling sessions, Psychoeducation, Medication administration, Evaluate responses to treatment, Monitor vital signs and CBGs as ordered, Perform/monitor CIWA, COWS, AIMS and Fall Risk screenings as ordered, Perform wound care treatments as ordered.  Evaluation of Outcomes: Progressing   LCSW Treatment Plan for Primary Diagnosis: Schizoaffective disorder, depressive type (HCC) Long Term Goal(s): Safe transition to appropriate next level of care at discharge, Engage patient in therapeutic group addressing interpersonal concerns.  Short Term Goals: Engage patient in aftercare planning with referrals and resources and Increase skills for wellness and recovery  Therapeutic Interventions: Assess for all discharge needs, 1 to 1 time with Social worker, Explore available resources and support systems, Assess for adequacy in community support network, Educate family and significant other(s) on suicide prevention, Complete Psychosocial Assessment, Interpersonal group therapy.  Evaluation of Outcomes: Progressing   Progress in Treatment: Attending groups: No. Participating in groups: No. Taking medication as prescribed: Yes. Toleration medication: Yes. Family/Significant other contact made: Yes, individual(s) contacted:  mother Patient understands diagnosis: Yes. Discussing patient identified problems/goals with staff: Yes. Medical problems stabilized or resolved: Yes. Denies suicidal/homicidal ideation: Yes. Issues/concerns per patient self-inventory: No. Other: n/a  New problem(s) identified: None identified at this time.   New Short Term/Long Term Goal(s): None identified at this time. Pt has not been participating in unit programming and mostly isolates/withdraws to room with minimum interaction with peers or staff. CSW will continue to encourager patient to attend groups.   Discharge Plan or Barriers: ECT treatments resumed on July 2nd, Pt will return home with Mother and needs referral  to ACTT team in [place] or other appropriate outpatient services.   Reason for Continuation of Hospitalization: Depression Medication stabilization Other; describe ECT treatment  Estimated Length of Stay: 3 to 5 days.   Attendees: Patient: 11/27/2016 8:39 AM  Physician: [name], MD 11/27/2016 8:39 AM  Nursing: Nira Retort, RN 11/27/2016 8:39 AM  RN Care Manager: 11/27/2016 8:39 AM  Social Worker: Fredrich Birks. Garnette Czech MSW, LCSWA 11/27/2016 8:39 AM  Recreational Therapist:  11/27/2016 8:39 AM  Other:  11/27/2016 8:39 AM  Other:  11/27/2016 8:39 AM  Other: 11/27/2016 8:39 AM    Scribe for Treatment Team: Arelia Longest, LCSWA 11/27/2016 2:43 PM

## 2016-11-27 NOTE — Progress Notes (Signed)
Pt appeared to sleep about 7.5 hours while monitored on 15 minute safety checks. 

## 2016-11-27 NOTE — Progress Notes (Signed)
Patient moving around in bed, restless and agitated.

## 2016-11-27 NOTE — Anesthesia Preprocedure Evaluation (Addendum)
Anesthesia Evaluation  Patient identified by MRN, date of birth, ID band Patient awake    Reviewed: Allergy & Precautions, NPO status , Patient's Chart, lab work & pertinent test results, reviewed documented beta blocker date and time   Airway Mallampati: II  TM Distance: >3 FB     Dental  (+) Chipped   Pulmonary asthma , Current Smoker,           Cardiovascular hypertension, Pt. on medications      Neuro/Psych PSYCHIATRIC DISORDERS Anxiety Depression Schizophrenia    GI/Hepatic   Endo/Other  diabetes, Type 2  Renal/GU      Musculoskeletal   Abdominal   Peds  Hematology   Anesthesia Other Findings BS 248. 5u of novolog given this am.  Reproductive/Obstetrics                            Anesthesia Physical Anesthesia Plan  ASA: III  Anesthesia Plan: General   Post-op Pain Management:    Induction: Intravenous  PONV Risk Score and Plan:   Airway Management Planned:   Additional Equipment:   Intra-op Plan:   Post-operative Plan:   Informed Consent: I have reviewed the patients History and Physical, chart, labs and discussed the procedure including the risks, benefits and alternatives for the proposed anesthesia with the patient or authorized representative who has indicated his/her understanding and acceptance.     Plan Discussed with: CRNA  Anesthesia Plan Comments:         Anesthesia Quick Evaluation

## 2016-11-27 NOTE — Transfer of Care (Signed)
Immediate Anesthesia Transfer of Care Note  Patient: Tony Cunningham  Procedure(s) Performed: ECT  Patient Location: PACU  Anesthesia Type:General  Level of Consciousness: sedated  Airway & Oxygen Therapy: Patient Spontanous Breathing and Patient connected to face mask oxygen  Post-op Assessment: Report given to RN and Post -op Vital signs reviewed and stable  Post vital signs: Reviewed and stable  Last Vitals:  Vitals:   11/27/16 1056 11/27/16 1236  BP: 110/75 123/75  Pulse: 70 77  Resp: 18 16  Temp: 36.8 C 97.42F    Last Pain:  Vitals:   11/27/16 1056  TempSrc: Oral  PainSc:          Complications: No apparent anesthesia complications

## 2016-11-27 NOTE — Progress Notes (Signed)
Patient returned to unit after having ECT procedure done. Patient alert and oriented x 4. Ambulatory with a steady gait, no complaints voiced. Blood sugar checked with a reading of 118, no coverage needed, scheduled dose administered. Food and po fluids provided to patient. Ate meal without difficulty, after meal pt observed resting in bed with eyes closed. Will continue to monitor.

## 2016-11-27 NOTE — H&P (Signed)
Tony Cunningham is an 41 y.o. male.   Chief Complaint: Patient reports that he is feeling a little bit better. Still anxious. Not having acute suicidal thoughts HPI: History of recurrent severe depression and anxiety  Past Medical History:  Diagnosis Date  . Anxiety   . Asthma   . Diabetes mellitus   . High blood pressure   . Sinus complaint     History reviewed. No pertinent surgical history.  History reviewed. No pertinent family history. Social History:  reports that he has been smoking Cigarettes.  He has been smoking about 0.50 packs per day. He has never used smokeless tobacco. He reports that he does not drink alcohol or use drugs.  Allergies: No Known Allergies  Medications Prior to Admission  Medication Sig Dispense Refill  . cloZAPine (CLOZARIL) 100 MG tablet Take 1 tablet (100 mg total) by mouth daily. Daily at 1700 30 tablet 0  . cyclobenzaprine (FLEXERIL) 10 MG tablet Take 1 tablet (10 mg total) by mouth 3 (three) times daily as needed for muscle spasms. 60 tablet 0  . fluvoxaMINE (LUVOX) 50 MG tablet Take 1 tablet (50 mg total) by mouth daily. Daily at 1700 30 tablet 0  . insulin aspart (NOVOLOG) 100 UNIT/ML injection Inject 5 Units into the skin 3 (three) times daily with meals. 5 mL 0  . insulin glargine (LANTUS) 100 UNIT/ML injection Inject 0.42 mLs (42 Units total) into the skin at bedtime. 13 mL 0  . ipratropium (ATROVENT) 0.06 % nasal spray Place 1 spray into both nostrils daily after supper. 3 mL 0  . lidocaine (LIDODERM) 5 % Place 1 patch onto the skin daily. Remove & Discard patch within 12 hours or as directed by MD 30 patch 0  . lisinopril (PRINIVIL,ZESTRIL) 20 MG tablet Take 1 tablet (20 mg total) by mouth daily. 30 tablet 0  . meloxicam (MOBIC) 7.5 MG tablet Take 1 tablet (7.5 mg total) by mouth 2 (two) times daily. 60 tablet 0  . metFORMIN (GLUCOPHAGE) 1000 MG tablet Take 1 tablet (1,000 mg total) by mouth 2 (two) times daily with a meal. 60 tablet 0  .  montelukast (SINGULAIR) 10 MG tablet Take 1 tablet (10 mg total) by mouth at bedtime. For Asthma 30 tablet 0  . polyethylene glycol (MIRALAX / GLYCOLAX) packet Take 17 g by mouth daily. 30 each 0  . propranolol (INDERAL) 20 MG tablet Take 1 tablet (20 mg total) by mouth 2 (two) times daily. 60 tablet 0  . simvastatin (ZOCOR) 40 MG tablet Take 1 tablet (40 mg total) by mouth at bedtime. For high cholesterol 15 tablet 0    Results for orders placed or performed during the hospital encounter of 11/06/16 (from the past 48 hour(s))  Glucose, capillary     Status: Abnormal   Collection Time: 11/25/16  4:31 PM  Result Value Ref Range   Glucose-Capillary 139 (H) 65 - 99 mg/dL  Glucose, capillary     Status: None   Collection Time: 11/25/16  8:29 PM  Result Value Ref Range   Glucose-Capillary 71 65 - 99 mg/dL  Glucose, capillary     Status: Abnormal   Collection Time: 11/26/16  6:59 AM  Result Value Ref Range   Glucose-Capillary 261 (H) 65 - 99 mg/dL  Glucose, capillary     Status: Abnormal   Collection Time: 11/26/16 11:35 AM  Result Value Ref Range   Glucose-Capillary 218 (H) 65 - 99 mg/dL  Glucose, capillary     Status:  Abnormal   Collection Time: 11/26/16  4:37 PM  Result Value Ref Range   Glucose-Capillary 126 (H) 65 - 99 mg/dL  Glucose, capillary     Status: Abnormal   Collection Time: 11/26/16  8:07 PM  Result Value Ref Range   Glucose-Capillary 194 (H) 65 - 99 mg/dL  Glucose, capillary     Status: Abnormal   Collection Time: 11/27/16  6:42 AM  Result Value Ref Range   Glucose-Capillary 248 (H) 65 - 99 mg/dL   No results found.  Review of Systems  Constitutional: Negative.   HENT: Negative.   Eyes: Negative.   Respiratory: Negative.   Cardiovascular: Negative.   Gastrointestinal: Negative.   Musculoskeletal: Negative.   Skin: Negative.   Neurological: Negative.   Psychiatric/Behavioral: Positive for depression and memory loss. Negative for hallucinations, substance abuse  and suicidal ideas. The patient is nervous/anxious. The patient does not have insomnia.     Blood pressure 110/75, pulse 70, temperature 98.2 F (36.8 C), temperature source Oral, resp. rate 18, height 5\' 8"  (1.727 m), weight 78.5 kg (173 lb), SpO2 97 %. Physical Exam  Nursing note and vitals reviewed. Constitutional: He appears well-developed and well-nourished.  HENT:  Head: Normocephalic and atraumatic.  Eyes: Conjunctivae are normal. Pupils are equal, round, and reactive to light.  Neck: Normal range of motion.  Cardiovascular: Regular rhythm and normal heart sounds.   Respiratory: Effort normal. No respiratory distress.  GI: Soft.  Musculoskeletal: Normal range of motion.  Neurological: He is alert.  Skin: Skin is warm and dry.  Psychiatric: Judgment normal. His affect is blunt. His speech is delayed. He is slowed. Thought content is not paranoid. He expresses no suicidal ideation. He exhibits abnormal recent memory.     Assessment/Plan This is his third ECT treatment. Generally tolerated well. Seems to be showing a little bit of improvement. We may BE at a phase of starting to look towards discharge next week  , MD 11/27/2016, 12:14 PM

## 2016-11-27 NOTE — Progress Notes (Signed)
OR orderly at bedside to assist with patient.  Patient Restless and agitated.

## 2016-11-27 NOTE — Progress Notes (Signed)
Patient ID: Tony Cunningham, male   DOB: 1975/10/13, 41 y.o.   MRN: [id number]  CSW spoke with Larita Fife [phone]) Environmental health practitioner from [employer] team in [place]. This [facility] location covers [place], [place], [place], and [place] counties. Staff member emailed CSW referral from for [employer]. Referral faxed to [facility]. Will need to be contacted Monday 11/30/2016 to schedule patient's initial intake assessment.   CSW also spoke with Tami Lin, patient's former group home manager and informed her that CSW Jake Shark has spoken with patient's mother and the plan is for patient to return home with her at discharge. Mrs. Aundria Rud is asking to be contact on 11/30/2016 to be updated on patient's discharge date so she can coordinate with his mother about picking up patient's belongings at the group home. No further questions for CSW at this time.  CSW also spoke with patient's sister-in-law Daouda Lonzo [phone]). Obtained patient's new address where he will be residing at discharge. New address will be [address], [place], [address]. Family is asking to be contacted the day before patient is discharging so that transportation can be coordinated.   Alzada Brazee G. Garnette Czech MSW, [facility] 11/27/2016 4:19 PM

## 2016-11-27 NOTE — Progress Notes (Signed)
[facility] MD Progress Note  11/27/2016 6:18 PM Tony Cunningham  MRN:  [id number]  Subjective:  Tony Cunningham is a 41 year old male with prior diagnosis of schizoaffective disorder. He received ECT to be continued next week if appropriate.  He was started on Clozapine and his dose was titrated to 225 mg. He is also on Luvox. Clozapine level was quite high with resulting sedation, because of the combination. Unfortunately in ERROR his Clozapine was discotoniued after Monday. I restarted 100 mg of Clozapine tonight. Actually, he is doing well without it, less sedated. Not knowing he was off Clozapine I took another level Thursday night.  11/08/16. The patient has been very isolative and withdrawn. Hygiene is poor and he is malodorous. He has not showered. He did go outside with the other patients and has been getting up for meals but not attending groups or interacting with staff or peers. He denies any current active or passive suicidal thoughts but does admit to some very vague paranoid thoughts that others are watching him. He is very limited and talking with this Probation officer and wanted to be "left alone to sleep". He denied any current somatic complaints. Vital signs are stable and he slept fairly well last night but is also sleeping partially during the daytime. So far, he is tolerating the Clozaril fairly well without any physical adverse side effects.  Follow-up for this 41 year old man with what appears to be psychotic depression or schizoaffective disorder. Patient remains in bed most of the time. Poorly interactive. Mood is depressed. Passive suicidal thoughts.  Follow-up for Thursday the 21st. Patient remains withdrawn very little activity. Blood sugars are also running much higher. Patient continues to endorse feeling depressed and having vague suicidal thoughts.  Follow-up for Friday the 22nd. Patient finally had ECT this morning bilateral treatment which went off without any difficulty or complication. I saw him  this afternoon and I think he actually was a little more interactive and verbal and energetic than before. This may be wishful thinking but it makes me optimistic. No specific complaints other than headache  Patient was seen today for follow-up. Patient has no new complaints. Continues to say his thoughts are racing but his affect is flat and withdrawn he is almost completely withdrawn from other people and stays in his room by himself. Suicidal thoughts passive no intention or plan. Still has psychotic symptoms. Blood sugars unfortunately continued to run quite high.  6/23 patient denies having any issues or concerns today. Says that he has been eating and sleeping well. Denies side effects from medications or any physical complaints. Denies suicidality or homicidality. He says that he might be hallucinating but he is not sure. He said that he had 1 ECT procedure and it went well.   Blood glucose this morning was 92. Patient is compliant with medications. No major events overnight per nursing.  6/24 patient did not sleep well last night. Other than that he denies having any issues or concerns. He denies suicidality, homicidality or auditory visual hallucinations. During examination he appears much brighter and more conversational. His thought processes are linear.  11/16/2016. Tony Cunningham does not like talking to me today. He is in his room he is in bed He does not open his eyes for me and is unable to keep a conversation he keeps telling me okay okay and is unwilling to answer any questions. He received just one ECT treatment last week. Since Dr. Weber Cooks is on vacation, ECT will resume next week.  He  accepts medications and seems to tolerate them well Clozaril level is over 700. This is probably from a combination of clozapine and Luvox. I will alert dose of clozapine to 200 mg  To avoid side effects. I will switch to Luvox to 1 nightly dose to improve sleep.  11/17/2016. Tony Cunningham is much more animated and  interactive today. He is in his room in bed bugs is able to hold a conversation and maintains good eye contact. He complains of stomach ache. He did have a bowel movement this morning. His mood is still depressed and he needs much encouragement to attend to his ADLs. Staff needs to strongly encourage showering. He has high hopes for ECT and will not stay in the hospital until next week to resume ECT with Dr. Weber Cooks Is back from vacation. Yesterday he is agreeable to the nighttime and clozapine dose decrease due to high clozapine level. This could have helped the patient stated more awake this morning. He slept almost 6 hours.  11/18/2016. Tony Cunningham seems more relaxed and talkative today. He is still very flat on his back but opens his eyes for me to we are having a small conversation. He has not been sleeping well. There are in spite of medication adjustments. He believes that trazodone works well for him. I will start trazodone tonight. She no longer complains of stomach pain. He does not participate in programming but takes medications with no problem. She met with the group home representative this week and was accepted to a group home with a bed open at the beginning of July. When asked returns from vacation, he will decide whether to continue ECT or whether the patient will be discharged.  11/19/2016. Tony Cunningham seems better today. This morning he has been up and walking around. He complains of "tension" in his head both mental and physical. He slept 3 hours only again in spite of multiple sedating medications given at night including Trazodone. I will switch him to Restoril tonight. It may need to be discontinued if her restarts ECT next week. No other somatic complaints. Sugers still elevated at times. I changed diet to ADA and discontinued Glucerna. Input from diabetes nurse coordinator and medicine consultant is greatly appreciated. He does not interact with staff/peers or participate in programing. Hygiene  is still questionable.  11/20/2016. Tony Cunningham is up and about today. He comes to my office for interview. He seems more relaxed today. Still very vague in his answers. Complains of back and neck pain but has been in bed on his back most of the week. His sugar was low today as he was switched to ADA diet and Glucerna was discontinued upon [facility] suggestion. He is looking forward to see Dr. Weber Cooks "who is the best". He corrects himself to tell me that I am not bad either.  Follow-up for June 30. Patient seen. Spoke with nursing. Patient continues to report feeling bad. Mood feels depressed. Denies acute suicidal thoughts but feels hopeless about the future. Feels paranoid and vaguely report psychosis. Patient continues to have poor self-care and poor interaction with others on the unit. Physically he seems to be stabilizing a little.  Follow-up Sunday, July 1. Patient seen. He looks a little better groomed today. He has been up out of his bed although he does not attend any groups. He is ambivalent about his mood today. Denies acute hallucinations or suicidal thinking. Still feels hopeless about discharge.  Patient seen for follow-up on Monday, July 2. He  had ECT treatment this morning which was complicated by some delirium and agitation during the recovery phase. Otherwise treatment went fine. Seen this afternoon the patient is still a little groggy from the medicines he received. No specific physical complaints however. Mood is still depressed and he is still slow and cognitively impaired. Denies acute suicidal intent. Sugars continue to be very labile. He is requesting a restart of his Glucerna but I am unsure whether that is appropriate.  [place] Tuesday, July 3. Patient has no new complaints. In fact today he seems to think he is doing a little bit better. His affect looked brighter and he was more conversant with me. He is taking some steps to improve his appearance.  Follow-up for Wednesday, July 4.  Patient seen. His only complaint today is wanting to have the dietary supplement Glucerna shake back. He thinks his mood is feeling better. He lets me know that he has been out of bed watching TV and being around people. Hallucinations are minimal. Denies suicidal thoughts. Admits that he is feeling a little more optimistic about going home with his family.  Follow-up for Thursday, July 5. Patient says he is feeling a little better than yesterday. Still feels anxious much of the time. He has made the effort to go to groups a little bit. Thought still are scattered and a little disorganized. Stays withdrawn most of the time. Denies however having active suicidal thoughts. Sugars continue to be labile but nothing terrible  Follow-up for Friday the sixth. Patient had ECT today which was tolerated well although he still has some agitation during recovery. Overall however his mood is improving. He is more interactive makes better eye contact and has managed to get up and be around other patients better. Denies any suicidal thoughts currently. Still feels confused and gets very nervous but denies he's having hallucinations.  Per nursing: Pt calm, cooperative and pleasant.  AM BS 149.  Speech is more clear and logical, though remains hypoverbal.  Complains of muscle pain in neck starting approx. 1 week ago.  PRN acetaminophen offered and eventually accepted.  Reports moderate relief.  Remains frequently isolative to room.  No behavioral issues.    Principal Problem: Schizoaffective disorder, depressive type (HCC) Diagnosis:   Patient Active Problem List   Diagnosis Date Noted  . Schizoaffective disorder, depressive type (HCC) [F25.1] 09/23/2016  . Polysubstance abuse [F19.10] 07/17/2016  . Tardive dyskinesia [G24.01] 07/16/2016  . Tobacco use disorder [F17.200] 07/07/2016  . Dyslipidemia [E78.5] 07/07/2016  . Asthma [J45.909] 07/07/2016  . HTN (hypertension) [I10] 07/06/2016  . Diabetes (HCC) [E11.9]  12/25/2010   Total Time spent with patient: 20 minutes    Past Medical History:  Past Medical History:  Diagnosis Date  . Anxiety   . Asthma   . Diabetes mellitus   . High blood pressure   . Sinus complaint    History reviewed. No pertinent surgical history.  Social History:  History  Alcohol Use No     History  Drug Use No    Social History   Social History  . Marital status: Single    Spouse name: [name]  . Number of children: [name]  . Years of education: [name]   Social History Main Topics  . Smoking status: Current Every Day Smoker    Packs/day: 0.50    Types: Cigarettes  . Smokeless tobacco: Never Used  . Alcohol use No  . Drug use: No  . Sexual activity: Not Currently   Other  Topics Concern  . None   Social History Narrative  . None   Additional Social History:                         Sleep: Good  Appetite:  Good  Current Medications: Current Facility-Administered Medications  Medication Dose Route Frequency Provider Last Rate Last Dose  . acetaminophen (TYLENOL) tablet 650 mg  650 mg Oral Q6H PRN [name], Madie Reno, MD   650 mg at 11/19/16 1739  . alum & mag hydroxide-simeth (MAALOX/MYLANTA) 200-200-20 MG/5ML suspension 30 mL  30 mL Oral Q4H PRN [name], [name], MD      . cloZAPine (CLOZARIL) tablet 100 mg  100 mg Oral QHS [name], [name], MD   100 mg at 11/26/16 2130  . dextrose 5 %-0.45 % sodium chloride infusion   Intravenous Continuous [name], Madie Reno, MD 10 mL/hr at 11/13/16 1009    . feeding supplement (GLUCERNA SHAKE) (GLUCERNA SHAKE) liquid 237 mL  237 mL Oral TID WC [name], [name], MD   237 mL at 11/27/16 1716  . fentaNYL (SUBLIMAZE) injection 25 mcg  25 mcg Intravenous Q5 min PRN Gunnar Bulla, MD      . fluvoxaMINE (LUVOX) tablet 100 mg  100 mg Oral QHS [name], [name], MD   100 mg at 11/26/16 2130  . insulin aspart (novoLOG) injection 0-15 Units  0-15 Units Subcutaneous TID WC Loletha Grayer, MD   5 Units at 11/27/16  [phone]  . insulin aspart (novoLOG) injection 0-5 Units  0-5 Units Subcutaneous QHS Loletha Grayer, MD   3 Units at 11/24/16 2155  . insulin aspart (novoLOG) injection 10 Units  10 Units Subcutaneous TID WC Fritzi Mandes, MD   10 Units at 11/27/16 1637  . insulin glargine (LANTUS) injection 55 Units  55 Units Subcutaneous QHS Fritzi Mandes, MD   55 Units at 11/26/16 2130  . lisinopril (PRINIVIL,ZESTRIL) tablet 20 mg  20 mg Oral Daily [name], Madie Reno, MD   20 mg at 11/27/16 0644  . magnesium hydroxide (MILK OF MAGNESIA) suspension 30 mL  30 mL Oral Daily PRN [name], [name], MD      . meloxicam (MOBIC) tablet 7.5 mg  7.5 mg Oral BID PC [name], [name], MD   7.5 mg at 11/27/16 1713  . metFORMIN (GLUCOPHAGE) tablet 1,000 mg  1,000 mg Oral BID WC [name], Madie Reno, MD   1,000 mg at 11/27/16 1712  . montelukast (SINGULAIR) tablet 10 mg  10 mg Oral QHS [name], Madie Reno, MD   10 mg at 11/26/16 2130  . nicotine (NICODERM CQ - dosed in mg/24 hr) patch 7 mg  7 mg Transdermal Daily Chauncey Mann, MD   7 mg at 11/27/16 0844  . ondansetron (ZOFRAN) injection 4 mg  4 mg Intravenous Once PRN Gunnar Bulla, MD      . pantoprazole (PROTONIX) EC tablet 40 mg  40 mg Oral Daily [name], [name], MD   40 mg at 11/27/16 0644  . polyethylene glycol (MIRALAX / GLYCOLAX) packet 17 g  17 g Oral Daily [name], [name], MD   17 g at 11/27/16 1323  . promethazine (PHENERGAN) tablet 12.5 mg  12.5 mg Oral Q6H PRN [name], [name], MD      . propranolol (INDERAL) tablet 20 mg  20 mg Oral BID [name], Madie Reno, MD   20 mg at 11/27/16 1712  . simvastatin (ZOCOR) tablet 40 mg  40 mg Oral  [id number] [name], Madie Reno, MD   40 mg at 11/27/16 1713  . temazepam (RESTORIL) capsule 15 mg  15 mg Oral QHS [name], [name], MD   15 mg at 11/26/16 2130    Lab Results:  Results for orders placed or performed during the hospital encounter of 11/06/16 (from the past 48 hour(s))  Glucose, capillary     Status: None   Collection Time: 11/25/16  8:29  PM  Result Value Ref Range   Glucose-Capillary 71 65 - 99 mg/dL  Glucose, capillary     Status: Abnormal   Collection Time: 11/26/16  6:59 AM  Result Value Ref Range   Glucose-Capillary 261 (H) 65 - 99 mg/dL  Glucose, capillary     Status: Abnormal   Collection Time: 11/26/16 11:35 AM  Result Value Ref Range   Glucose-Capillary 218 (H) 65 - 99 mg/dL  Glucose, capillary     Status: Abnormal   Collection Time: 11/26/16  4:37 PM  Result Value Ref Range   Glucose-Capillary 126 (H) 65 - 99 mg/dL  Glucose, capillary     Status: Abnormal   Collection Time: 11/26/16  8:07 PM  Result Value Ref Range   Glucose-Capillary 194 (H) 65 - 99 mg/dL  Glucose, capillary     Status: Abnormal   Collection Time: 11/27/16  6:42 AM  Result Value Ref Range   Glucose-Capillary 248 (H) 65 - 99 mg/dL  Glucose, capillary     Status: Abnormal   Collection Time: 11/27/16 11:37 AM  Result Value Ref Range   Glucose-Capillary 126 (H) 65 - 99 mg/dL  Glucose, capillary     Status: Abnormal   Collection Time: 11/27/16  1:22 PM  Result Value Ref Range   Glucose-Capillary 118 (H) 65 - 99 mg/dL  Glucose, capillary     Status: Abnormal   Collection Time: 11/27/16  4:34 PM  Result Value Ref Range   Glucose-Capillary 115 (H) 65 - 99 mg/dL    Blood Alcohol level:  Lab Results  Component Value Date   ETH <5 10/21/2016   ETH <5 16/60/6301    Metabolic Disorder Labs: Lab Results  Component Value Date   HGBA1C 10.3 (H) 11/09/2016   MPG 249 11/09/2016   MPG 275 10/09/2016   Lab Results  Component Value Date   PROLACTIN 24.5 (H) 09/24/2016   PROLACTIN 3.4 (L) 07/07/2016   Lab Results  Component Value Date   CHOL 92 11/09/2016   TRIG 54 11/09/2016   HDL 39 (L) 11/09/2016   CHOLHDL 2.4 11/09/2016   VLDL 11 11/09/2016   LDLCALC 42 11/09/2016   LDLCALC 72 10/09/2016    Physical Findings: AIMS: Facial and Oral Movements Muscles of Facial Expression: Minimal Lips and Perioral Area: Minimal Jaw: None,  normal Tongue: None, normal,Extremity Movements Upper (arms, wrists, hands, fingers): None, normal Lower (legs, knees, ankles, toes): Mild, Trunk Movements Neck, shoulders, hips: None, normal, Overall Severity Severity of abnormal movements (highest score from questions above): Mild Incapacitation due to abnormal movements: None, normal Patient's awareness of abnormal movements (rate only patient's report): No Awareness, Dental Status Current problems with teeth and/or dentures?: No Does patient usually wear dentures?: No   Musculoskeletal: Strength & Muscle Tone: within normal limits Gait & Station: normal Patient leans: [name]  Psychiatric Specialty Exam: Physical Exam  Nursing note and vitals reviewed. Constitutional: He appears well-developed and well-nourished.  HENT:  Head: Normocephalic and atraumatic.  Eyes: Conjunctivae are normal. Pupils are equal, round, and reactive to light.  Neck:  Normal range of motion.  Cardiovascular: Regular rhythm and normal heart sounds.   Respiratory: Effort normal.  GI: Soft.  Musculoskeletal: Normal range of motion.  Neurological: He is alert.  Skin: Skin is warm and dry.  Psychiatric: His affect is blunt. His speech is delayed. He is slowed. Thought content is not paranoid and not delusional. Cognition and memory are normal. He expresses impulsivity.    Review of Systems  Constitutional: Negative.   HENT: Negative.   Eyes: Negative.   Cardiovascular: Negative.   Gastrointestinal: Negative.   Genitourinary: Negative.   Musculoskeletal: Negative.   Skin: Negative.   Neurological: Negative.   Endo/Heme/Allergies: Negative.   Psychiatric/Behavioral: Positive for depression. Negative for hallucinations.    Blood pressure 128/74, pulse 77, temperature 98.3 F (36.8 C), temperature source Oral, resp. rate 18, height '5\' 8"'$  (1.727 m), weight 78.5 kg (173 lb), SpO2 98 %.Body mass index is 26.3 kg/m.  General Appearance: Disheveled  Eye  Contact:  Poor  Speech:  Slow  Volume:  Decreased  Mood:  Dysphoric  Affect:  Blunt  Thought Process:  Linear  Orientation:  Full (Time, Place, and Person)  Thought Content:  Paranoid Ideation  Suicidal Thoughts:  No  Homicidal Thoughts:  No  Memory:  Immediate;   Fair Recent;   Fair Remote;   Fair  Judgement:  Impaired  Insight:  Lacking  Psychomotor Activity:  Decreased  Concentration:  Concentration: Fair and Attention Span: Fair  Recall:  [employer] of Knowledge:  Fair  Language:  Good  Akathisia:  No  Handed:  Right  AIMS (if indicated):     Assets:  Housing Physical Health  ADL's:  Intact  Cognition:  WNL  Sleep:  Number of Hours: 7.5     Treatment Plan Summary:  Tony Cunningham 41 year old male with prior diagnosis of schizoaffective disorder who presented to the emergency room with vague suicidal thoughts as well as paranoid thoughts. He does. Be responding to internal stimuli and has endorsed some auditory and visual hallucinations. He will be admitted to inpatient psychiatry for medication management, safety and stabilization.  1. Schizoaffective disorder, depressive type. He was started on Clozaril and his dose was increased to 225 mg nightly for psychosis. Clozapine level 748 most likely due to interaction with the Luvox. I tried to lower Clozapine dose but in ERROR we discontinue Clozapine altogether. Last dose was given on Monday. I ordered Clozapine level on Friday unnecessarily. I restarted 100 mg of Clozapine tonight.   2. ECT. Patient had ECT on Friday,11/13/2016 for the first time in this hospitalization. I am optimistic that this treatment may make a real difference. Unfortunately I will be away from the hospital all of next week. I am leaving the patient in the care of Dr. Mamie Nick and will sign out to her. If the patient is still in the hospital after this next week we will plan to continue ECT treatment picking up again on July 2.   3. Metabolic syndrome monitoring.  Lipid panel is normal, hemoglobin A1c 103.  4. EKG. Normal sinus rhythm, QTc 417.  5. Questionable history of polysubstance abuse: The patient denies any history of any polysubstance use but records indicate that there are may have been a history of polysubstance use. He was advised to abstain from alcohol and all illicit drugs as it may worsen anxiety mood symptoms as well as psychosis. The patient is not interested in any substance abuse treatment.  6. Nicotine use disorder. Nicotine patch is  available.   7. Diabetes. He is on Metformin, Novolog 10 units with meals and Lantus 55 units, along with ADA diet and blood sugar monitoring. Medicine input is greatly appreciated. Glucerna is discontinued.  8. Hypertension: Vital signs are stable. We'll continue lisinopril 20 mg daily.  9. Seasonal allergies: Continue Singulair 10 mg nightly  10. Anxiety/restlessness. He is on Propranolol.   11. Insomnia. We discontinued Trazodone and started Restoril. Slept 7.5 hours.  12. Back pain. We will give Robaxin.  13. Disposition: The patient will return to a group home. He will follow up with [facility] in [facility]. Consider ACT team.  Still on the schedule for ECT for Monday. I think he is clearly benefiting from the combination of ECT and his medicine. Possible discharge into next week depending on how he is doing and what kind of a discharge plan we have. No other change to medicine for today   Alethia Berthold, MD 11/27/2016, 6:18 PM

## 2016-11-27 NOTE — Plan of Care (Signed)
Problem: Activity: Goal: Sleeping patterns will improve Outcome: Progressing It was reported that patient has been getting at least 6 hours of sleep per night.

## 2016-11-27 NOTE — Progress Notes (Signed)
Patient ID: Tony Cunningham, male   DOB: 10-24-1975, 41 y.o.   MRN: [id number]  CSW contact [facility] Location to inquire about ACTT services available in [employer] or [employer]. CSW was informed by staff that there is no longer a ACTT team available with [name] that covers that [employer].   [name] G. Garnette Czech MSW, LCSWA 11/27/2016 9:24 AM

## 2016-11-27 NOTE — Progress Notes (Signed)
Patient is alert and oriented to person, place and time. Skin is warm, dry and intact. No limitations to all four extremities noted. Patient currently denies SI, AVH at this time. Medication is taken by patient without difficulty nor noted side affects. Patient was observed ambulating in hall during the shift with a steady gait. Attends meals with minimal peer interaction noted. VS WNL, milieu remains therapeutic. Patient will be monitored and physician notified of any acute changes.

## 2016-11-27 NOTE — Plan of Care (Signed)
Problem: Activity: Goal: Will identify at least one activity in which they can participate Outcome: Progressing Improved coping.  Problem: Coping: Goal: Ability to identify and develop effective coping behavior will improve Outcome: Progressing Improved coping with stressors Goal: Ability to interact with others will improve Outcome: Progressing Improved interaction and is more spontaneous Goal: Participation in decision-making will improve Outcome: Progressing Able to acknowledge his treatment goals Goal: Ability to use eye contact when communicating with others will improve Outcome: Progressing Improved eye contact with staff and peers  Problem: Health Behavior/Discharge Planning: Goal: Identification of resources available to assist in meeting health care needs will improve Outcome: Not Progressing Not able to identify resources yet and is still working on follow up  Problem: Self-Concept: Goal: Ability to verbalize positive feelings about self will improve Outcome: Progressing Reports knowledge of improvement  Problem: Activity: Goal: Interest or engagement in activities will improve Outcome: Progressing Attending groups Goal: Sleeping patterns will improve Outcome: Progressing Sleeps well at night less napping during the day

## 2016-11-27 NOTE — Anesthesia Post-op Follow-up Note (Cosign Needed)
Anesthesia QCDR form completed.        

## 2016-11-27 NOTE — Anesthesia Procedure Notes (Signed)
Date/Time: 11/27/2016 12:24 PM Performed by: Lily Kocher Pre-anesthesia Checklist: Patient identified, Emergency Drugs available, Suction available and Patient being monitored Patient Re-evaluated:Patient Re-evaluated prior to inductionOxygen Delivery Method: Circle system utilized Preoxygenation: Pre-oxygenation with 100% oxygen Intubation Type: IV induction Ventilation: Mask ventilation without difficulty and Mask ventilation throughout procedure Airway Equipment and Method: Bite block Placement Confirmation: positive ETCO2 Dental Injury: Teeth and Oropharynx as per pre-operative assessment

## 2016-11-27 NOTE — Plan of Care (Signed)
Problem: Coping: Goal: Ability to verbalize frustrations and anger appropriately will improve Outcome: Progressing Patient able to verbalize frustrations in a productive manner. Goal: Ability to demonstrate self-control will improve Outcome: Progressing Patient able to demonstrate self-control.  Problem: Health Behavior/Discharge Planning: Goal: Compliance with treatment plan for underlying cause of condition will improve Outcome: Progressing Patient compliant with treatment plan

## 2016-11-27 NOTE — Procedures (Signed)
[facility] Physician's Interval Evaluation & Treatment Note  Patient Identification: Tony Cunningham MRN:  [id number] Date of Evaluation:  11/27/2016 TX #: 3  MADRS:   MMSE:   P.E. Findings:  No change to physical exam. Heart and lungs normal. Vitals unremarkable. Blood sugar of but not terrible  Psychiatric Interval Note:  Mood has been slightly better anxiety a little better. More interactive.  Subjective:  Patient is a 41 y.o. male seen for evaluation for Electroconvulsive Therapy. No specific new complaint  Treatment Summary:   []   Right Unilateral             [x]  Bilateral   % Energy : 1.0 ms 60%   Impedance: 1150 ohms   Seizure Energy Index: 24,392 V squared  Postictal Suppression Index: Less than 10% Seizure Concordance Index: 91%  Medications  Pre Shock: Brevital 80 mg succinylcholine 100 mg  Post Shock: Versed 4 mg Haldol 5 mg  Seizure Duration: 38 seconds by EMG, 51 seconds by EEG   Comments: Follow-up on Monday   Lungs:  [x]   Clear to auscultation               []  Other:   Heart:    [x]   Regular rhythm             []  irregular rhythm    [x]   Previous H&P reviewed, patient examined and there are NO CHANGES                 []   Previous H&P reviewed, patient examined and there are changes noted.   , MD 7/6/201812:16 PM

## 2016-11-28 DIAGNOSIS — F251 Schizoaffective disorder, depressive type: Principal | ICD-10-CM

## 2016-11-28 DIAGNOSIS — I10 Essential (primary) hypertension: Secondary | ICD-10-CM

## 2016-11-28 DIAGNOSIS — F17210 Nicotine dependence, cigarettes, uncomplicated: Secondary | ICD-10-CM

## 2016-11-28 DIAGNOSIS — J302 Other seasonal allergic rhinitis: Secondary | ICD-10-CM

## 2016-11-28 DIAGNOSIS — M549 Dorsalgia, unspecified: Secondary | ICD-10-CM

## 2016-11-28 DIAGNOSIS — F419 Anxiety disorder, unspecified: Secondary | ICD-10-CM

## 2016-11-28 DIAGNOSIS — E118 Type 2 diabetes mellitus with unspecified complications: Secondary | ICD-10-CM

## 2016-11-28 LAB — GLUCOSE, CAPILLARY
GLUCOSE-CAPILLARY: 213 mg/dL — AB (ref 65–99)
GLUCOSE-CAPILLARY: 108 mg/dL — AB (ref 65–99)
GLUCOSE-CAPILLARY: 69 mg/dL (ref 65–99)
GLUCOSE-CAPILLARY: 302 mg/dL — AB (ref 65–99)
Glucose-Capillary: 190 mg/dL — ABNORMAL HIGH (ref 65–99)

## 2016-11-28 NOTE — [facility] Group Notes (Signed)
[facility] LCSW Group Therapy  11/28/2016 1:51 PM  Type of Therapy:  Group Therapy  Participation Level:  Patient did not attend group. CSW invited patient to group.   Summary of Progress/Problems: Feelings around Relapse. Group members discussed the meaning of relapse and shared personal stories of relapse, how it affected them and others, and how they perceived themselves during this time. Group members were encouraged to identify triggers, warning signs and coping skills used when facing the possibility of relapse. Social supports were discussed and explored in detail. Patients also discussed facing disappointment and how that can trigger someone to relapse.  Reginold Beale G. Garnette Czech MSW, LCSWA 11/28/2016, 1:51 PM

## 2016-11-28 NOTE — Plan of Care (Signed)
Problem: Coping: Goal: Ability to use eye contact when communicating with others will improve Outcome: Progressing Maintained brief eye contact during our conversations.

## 2016-11-28 NOTE — Progress Notes (Signed)
D: Pt passive SI-contracts for safety denies HI/AVH. Pt is pleasant and cooperative. Pt stated he could not remember coming in . Pt more vocal on the unit and appeared to seem more like he knew what was going on around him. Pt was in the dayroom for a little while this evening.   A: Pt was offered support and encouragement. Pt was given scheduled medications. Pt was encourage to attend groups. Q 15 minute checks were done for safety.   R:Pt attends groups and interacts well with peers and staff. Pt is taking medication. Pt has no complaints.Pt receptive to treatment and safety maintained on unit.

## 2016-11-28 NOTE — Progress Notes (Signed)
Patient stated to this writer that he was still depressed but went on to state that he was much better than when he came in. When asked what made him to be so depressed he said, "I don't know, I just was depressed." Patient noted with a flat, sad and depressed affect. He maintained brief eye contact with this Clinical research associate. Patient was encouraged to make a goal/point of getting out of bed, attending class and being in the milieu. Writer assisted patient to take a shower and change bed linen after lunch. He attended afternoon class and was seen the dayroom briefly and therefore accomplished today's set goal.

## 2016-11-28 NOTE — [facility] Group Notes (Signed)
[facility] Group Notes:  (Nursing/MHT/Case Management/Adjunct)  Date:  11/28/2016  Time:  6:17 AM  Type of Therapy:  Psychoeducational Skills  Participation Level:  Did Not Attend  Summary of Progress/Problems:  Tony Cunningham 11/28/2016, 6:17 AM

## 2016-11-28 NOTE — Plan of Care (Signed)
Problem: Activity: Goal: Interest or engagement in activities will improve Outcome: Progressing Pt seen in the dayroom watching TV Goal: Sleeping patterns will improve Outcome: Progressing Pt slept over 6 hrs last night

## 2016-11-28 NOTE — [facility] Group Notes (Signed)
[facility] LCSW Group Therapy  11/28/2016 3:00 PM  Type of Therapy:  Group Therapy  Participation Level:  Minimal  Participation Quality:  Attentive  Affect:  Appropriate  Cognitive:  Alert  Insight:  Limited  Engagement in Therapy:  Improving  Modes of Intervention:  Activity, Discussion, Education, Problem-solving, Reality Testing, Socialization and Support  Summary of Progress/Problems: Coping Skills: Patients defined and discussed healthy coping skills. Patients identified healthy coping skills they would like to try during hospitalization and after discharge. CSW offered insight to varying coping skills that may have been new to patients such as practicing mindfulness.  Tony Cunningham MSW, LCSWA 11/28/2016, 3:00 PM

## 2016-11-28 NOTE — Progress Notes (Signed)
[facility] MD Progress Note  11/28/2016 9:53 AM Tony Cunningham  MRN:  [id number]  Subjective:  Tony Cunningham is a 41 year old male with prior diagnosis of schizoaffective disorder. He received ECT to be continued next week if appropriate.  He was started on Clozapine and his dose was titrated to 225 mg. He is also on Luvox. Clozapine level was quite high with resulting sedation, because of the combination. Unfortunately in ERROR his Clozapine was discotoniued after Monday. I restarted 100 mg of Clozapine tonight. Actually, he is doing well without it, less sedated. Not knowing he was off Clozapine I took another level Thursday night.  11/08/16. The patient has been very isolative and withdrawn. Hygiene is poor and he is malodorous. He has not showered. He did go outside with the other patients and has been getting up for meals but not attending groups or interacting with staff or peers. He denies any current active or passive suicidal thoughts but does admit to some very vague paranoid thoughts that others are watching him. He is very limited and talking with this Probation officer and wanted to be "left alone to sleep". He denied any current somatic complaints. Vital signs are stable and he slept fairly well last night but is also sleeping partially during the daytime. So far, he is tolerating the Clozaril fairly well without any physical adverse side effects.  Follow-up for this 41 year old man with what appears to be psychotic depression or schizoaffective disorder. Patient remains in bed most of the time. Poorly interactive. Mood is depressed. Passive suicidal thoughts.  Follow-up for Thursday the 21st. Patient remains withdrawn very little activity. Blood sugars are also running much higher. Patient continues to endorse feeling depressed and having vague suicidal thoughts.  Follow-up for Friday the 22nd. Patient finally had ECT this morning bilateral treatment which went off without any difficulty or complication. I saw him  this afternoon and I think he actually was a little more interactive and verbal and energetic than before. This may be wishful thinking but it makes me optimistic. No specific complaints other than headache  Patient was seen today for follow-up. Patient has no new complaints. Continues to say his thoughts are racing but his affect is flat and withdrawn he is almost completely withdrawn from other people and stays in his room by himself. Suicidal thoughts passive no intention or plan. Still has psychotic symptoms. Blood sugars unfortunately continued to run quite high.  6/23 patient denies having any issues or concerns today. Says that he has been eating and sleeping well. Denies side effects from medications or any physical complaints. Denies suicidality or homicidality. He says that he might be hallucinating but he is not sure. He said that he had 1 ECT procedure and it went well.   Blood glucose this morning was 92. Patient is compliant with medications. No major events overnight per nursing.  6/24 patient did not sleep well last night. Other than that he denies having any issues or concerns. He denies suicidality, homicidality or auditory visual hallucinations. During examination he appears much brighter and more conversational. His thought processes are linear.  11/16/2016. Tony Cunningham does not like talking to me today. He is in his room he is in bed He does not open his eyes for me and is unable to keep a conversation he keeps telling me okay okay and is unwilling to answer any questions. He received just one ECT treatment last week. Since Dr. Weber Cooks is on vacation, ECT will resume next week.  He  accepts medications and seems to tolerate them well Clozaril level is over 700. This is probably from a combination of clozapine and Luvox. I will alert dose of clozapine to 200 mg  To avoid side effects. I will switch to Luvox to 1 nightly dose to improve sleep.  11/17/2016. Tony Cunningham is much more animated and  interactive today. He is in his room in bed bugs is able to hold a conversation and maintains good eye contact. He complains of stomach ache. He did have a bowel movement this morning. His mood is still depressed and he needs much encouragement to attend to his ADLs. Staff needs to strongly encourage showering. He has high hopes for ECT and will not stay in the hospital until next week to resume ECT with Dr. Weber Cooks Is back from vacation. Yesterday he is agreeable to the nighttime and clozapine dose decrease due to high clozapine level. This could have helped the patient stated more awake this morning. He slept almost 6 hours.  11/18/2016. Tony Cunningham seems more relaxed and talkative today. He is still very flat on his back but opens his eyes for me to we are having a small conversation. He has not been sleeping well. There are in spite of medication adjustments. He believes that trazodone works well for him. I will start trazodone tonight. She no longer complains of stomach pain. He does not participate in programming but takes medications with no problem. She met with the group home representative this week and was accepted to a group home with a bed open at the beginning of July. When asked returns from vacation, he will decide whether to continue ECT or whether the patient will be discharged.  11/19/2016. Tony Cunningham seems better today. This morning he has been up and walking around. He complains of "tension" in his head both mental and physical. He slept 3 hours only again in spite of multiple sedating medications given at night including Trazodone. I will switch him to Restoril tonight. It may need to be discontinued if her restarts ECT next week. No other somatic complaints. Sugers still elevated at times. I changed diet to ADA and discontinued Glucerna. Input from diabetes nurse coordinator and medicine consultant is greatly appreciated. He does not interact with staff/peers or participate in programing. Hygiene  is still questionable.  11/20/2016. Tony Cunningham is up and about today. He comes to my office for interview. He seems more relaxed today. Still very vague in his answers. Complains of back and neck pain but has been in bed on his back most of the week. His sugar was low today as he was switched to ADA diet and Glucerna was discontinued upon [facility] suggestion. He is looking forward to see Dr. Weber Cooks "who is the best". He corrects himself to tell me that I am not bad either.  Follow-up for June 30. Patient seen. Spoke with nursing. Patient continues to report feeling bad. Mood feels depressed. Denies acute suicidal thoughts but feels hopeless about the future. Feels paranoid and vaguely report psychosis. Patient continues to have poor self-care and poor interaction with others on the unit. Physically he seems to be stabilizing a little.  Follow-up Sunday, July 1. Patient seen. He looks a little better groomed today. He has been up out of his bed although he does not attend any groups. He is ambivalent about his mood today. Denies acute hallucinations or suicidal thinking. Still feels hopeless about discharge.  Patient seen for follow-up on Monday, July 2. He  had ECT treatment this morning which was complicated by some delirium and agitation during the recovery phase. Otherwise treatment went fine. Seen this afternoon the patient is still a little groggy from the medicines he received. No specific physical complaints however. Mood is still depressed and he is still slow and cognitively impaired. Denies acute suicidal intent. Sugars continue to be very labile. He is requesting a restart of his Glucerna but I am unsure whether that is appropriate.  [place] Tuesday, July 3. Patient has no new complaints. In fact today he seems to think he is doing a little bit better. His affect looked brighter and he was more conversant with me. He is taking some steps to improve his appearance.  Follow-up for Wednesday, July 4.  Patient seen. His only complaint today is wanting to have the dietary supplement Glucerna shake back. He thinks his mood is feeling better. He lets me know that he has been out of bed watching TV and being around people. Hallucinations are minimal. Denies suicidal thoughts. Admits that he is feeling a little more optimistic about going home with his family.  Follow-up for Thursday, July 5. Patient says he is feeling a little better than yesterday. Still feels anxious much of the time. He has made the effort to go to groups a little bit. Thought still are scattered and a little disorganized. Stays withdrawn most of the time. Denies however having active suicidal thoughts. Sugars continue to be labile but nothing terrible  Follow-up for Friday the sixth. Patient had ECT today which was tolerated well although he still has some agitation during recovery. Overall however his mood is improving. He is more interactive makes better eye contact and has managed to get up and be around other patients better. Denies any suicidal thoughts currently. Still feels confused and gets very nervous but denies he's having hallucinations.  11/28/2016- Patient continues to report being depressed. Has poor hygiene. Patient with strong malodor. He has not been attending any programming. Denies suicidal thoughts. Patient had 3 ECT treatments so far.  Per nursing: Pt calm, cooperative and pleasant.  AM BS 190.  Speech is more clear and logical, though remains hypoverbal.  Remains frequently isolative to room.  No behavioral issues.    Principal Problem: Schizoaffective disorder, depressive type ([place]) Diagnosis:   Patient Active Problem List   Diagnosis Date Noted  . Schizoaffective disorder, depressive type ([place]) [F25.1] 09/23/2016  . Polysubstance abuse [F19.10] 07/17/2016  . Tardive dyskinesia [G24.01] 07/16/2016  . Tobacco use disorder [F17.200] 07/07/2016  . Dyslipidemia [E78.5] 07/07/2016  . Asthma [J45.909]  07/07/2016  . HTN (hypertension) [I10] 07/06/2016  . Diabetes ([place]) [E11.9] 12/25/2010   Total Time spent with patient: 20 minutes    Past Medical History:  Past Medical History:  Diagnosis Date  . Anxiety   . Asthma   . Diabetes mellitus   . High blood pressure   . Sinus complaint    History reviewed. No pertinent surgical history.  Social History:  History  Alcohol Use No     History  Drug Use No    Social History   Social History  . Marital status: Single    Spouse name: [name]  . Number of children: [name]  . Years of education: [name]   Social History Main Topics  . Smoking status: Current Every Day Smoker    Packs/day: 0.50    Types: Cigarettes  . Smokeless tobacco: Never Used  . Alcohol use No  . Drug use: No  .  Sexual activity: Not Currently   Other Topics Concern  . None   Social History Narrative  . None   Additional Social History:                         Sleep: Good  Appetite:  Good  Current Medications: Current Facility-Administered Medications  Medication Dose Route Frequency Provider Last Rate Last Dose  . acetaminophen (TYLENOL) tablet 650 mg  650 mg Oral Q6H PRN [name], Madie Reno, MD   650 mg at 11/19/16 1739  . alum & mag hydroxide-simeth (MAALOX/MYLANTA) 200-200-20 MG/5ML suspension 30 mL  30 mL Oral Q4H PRN [name], [name], MD      . cloZAPine (CLOZARIL) tablet 100 mg  100 mg Oral QHS [name], [name], MD   100 mg at 11/27/16 2100  . dextrose 5 %-0.45 % sodium chloride infusion   Intravenous Continuous [name], Madie Reno, MD 10 mL/hr at 11/13/16 1009    . feeding supplement (GLUCERNA SHAKE) (GLUCERNA SHAKE) liquid 237 mL  237 mL Oral TID WC [name], [name], MD   237 mL at 11/28/16 0850  . fentaNYL (SUBLIMAZE) injection 25 mcg  25 mcg Intravenous Q5 min PRN Gunnar Bulla, MD      . fluvoxaMINE (LUVOX) tablet 100 mg  100 mg Oral QHS [name], [name], MD   100 mg at 11/27/16 2101  . insulin aspart (novoLOG) injection 0-15 Units   0-15 Units Subcutaneous TID WC Loletha Grayer, MD   3 Units at 11/28/16 [phone]  . insulin aspart (novoLOG) injection 0-5 Units  0-5 Units Subcutaneous QHS Loletha Grayer, MD   5 Units at 11/27/16 2056  . insulin aspart (novoLOG) injection 10 Units  10 Units Subcutaneous TID WC Fritzi Mandes, MD   10 Units at 11/28/16 [phone]  . insulin glargine (LANTUS) injection 55 Units  55 Units Subcutaneous QHS Fritzi Mandes, MD   55 Units at 11/27/16 2056  . lisinopril (PRINIVIL,ZESTRIL) tablet 20 mg  20 mg Oral Daily [name], Madie Reno, MD   20 mg at 11/28/16 0843  . magnesium hydroxide (MILK OF MAGNESIA) suspension 30 mL  30 mL Oral Daily PRN [name], [name], MD      . meloxicam (MOBIC) tablet 7.5 mg  7.5 mg Oral BID PC [name], [name], MD   7.5 mg at 11/28/16 0843  . metFORMIN (GLUCOPHAGE) tablet 1,000 mg  1,000 mg Oral BID WC [name], Madie Reno, MD   1,000 mg at 11/28/16 0843  . montelukast (SINGULAIR) tablet 10 mg  10 mg Oral QHS [name], Madie Reno, MD   10 mg at 11/27/16 2100  . nicotine (NICODERM CQ - dosed in mg/24 hr) patch 7 mg  7 mg Transdermal Daily Chauncey Mann, MD   7 mg at 11/28/16 0850  . ondansetron (ZOFRAN) injection 4 mg  4 mg Intravenous Once PRN Gunnar Bulla, MD      . pantoprazole (PROTONIX) EC tablet 40 mg  40 mg Oral Daily [name], [name], MD   40 mg at 11/28/16 0843  . polyethylene glycol (MIRALAX / GLYCOLAX) packet 17 g  17 g Oral Daily [name], Madie Reno, MD   17 g at 11/28/16 0844  . promethazine (PHENERGAN) tablet 12.5 mg  12.5 mg Oral Q6H PRN [name], [name], MD      . propranolol (INDERAL) tablet 20 mg  20 mg Oral BID [name], Madie Reno, MD   20 mg at 11/28/16 0843  . simvastatin (ZOCOR)  tablet 40 mg  40 mg Oral q1800 [name], Madie Reno, MD   40 mg at 11/27/16 1713  . temazepam (RESTORIL) capsule 15 mg  15 mg Oral QHS [name], [name], MD   15 mg at 11/27/16 2100    Lab Results:  Results for orders placed or performed during the hospital encounter of 11/06/16 (from the past 48 hour(s))   Glucose, capillary     Status: Abnormal   Collection Time: 11/26/16 11:35 AM  Result Value Ref Range   Glucose-Capillary 218 (H) 65 - 99 mg/dL  Glucose, capillary     Status: Abnormal   Collection Time: 11/26/16  4:37 PM  Result Value Ref Range   Glucose-Capillary 126 (H) 65 - 99 mg/dL  Glucose, capillary     Status: Abnormal   Collection Time: 11/26/16  8:07 PM  Result Value Ref Range   Glucose-Capillary 194 (H) 65 - 99 mg/dL  Glucose, capillary     Status: Abnormal   Collection Time: 11/27/16  6:42 AM  Result Value Ref Range   Glucose-Capillary 248 (H) 65 - 99 mg/dL  Glucose, capillary     Status: Abnormal   Collection Time: 11/27/16 11:37 AM  Result Value Ref Range   Glucose-Capillary 126 (H) 65 - 99 mg/dL  Glucose, capillary     Status: Abnormal   Collection Time: 11/27/16  1:22 PM  Result Value Ref Range   Glucose-Capillary 118 (H) 65 - 99 mg/dL  Glucose, capillary     Status: Abnormal   Collection Time: 11/27/16  4:34 PM  Result Value Ref Range   Glucose-Capillary 115 (H) 65 - 99 mg/dL  Glucose, capillary     Status: Abnormal   Collection Time: 11/27/16  8:52 PM  Result Value Ref Range   Glucose-Capillary 374 (H) 65 - 99 mg/dL  Glucose, capillary     Status: Abnormal   Collection Time: 11/28/16  6:47 AM  Result Value Ref Range   Glucose-Capillary 190 (H) 65 - 99 mg/dL    Blood Alcohol level:  Lab Results  Component Value Date   ETH <5 10/21/2016   ETH <5 11/18/9483    Metabolic Disorder Labs: Lab Results  Component Value Date   HGBA1C 10.3 (H) 11/09/2016   MPG 249 11/09/2016   MPG 275 10/09/2016   Lab Results  Component Value Date   PROLACTIN 24.5 (H) 09/24/2016   PROLACTIN 3.4 (L) 07/07/2016   Lab Results  Component Value Date   CHOL 92 11/09/2016   TRIG 54 11/09/2016   HDL 39 (L) 11/09/2016   CHOLHDL 2.4 11/09/2016   VLDL 11 11/09/2016   LDLCALC 42 11/09/2016   LDLCALC 72 10/09/2016    Physical Findings: AIMS: Facial and Oral  Movements Muscles of Facial Expression: Minimal Lips and Perioral Area: Minimal Jaw: None, normal Tongue: None, normal,Extremity Movements Upper (arms, wrists, hands, fingers): None, normal Lower (legs, knees, ankles, toes): Mild, Trunk Movements Neck, shoulders, hips: None, normal, Overall Severity Severity of abnormal movements (highest score from questions above): Mild Incapacitation due to abnormal movements: None, normal Patient's awareness of abnormal movements (rate only patient's report): No Awareness, Dental Status Current problems with teeth and/or dentures?: No Does patient usually wear dentures?: No   Musculoskeletal: Strength & Muscle Tone: within normal limits Gait & Station: normal Patient leans: [name]  Psychiatric Specialty Exam: Physical Exam  Nursing note and vitals reviewed. Constitutional: He appears well-developed and well-nourished.  HENT:  Head: Normocephalic and atraumatic.  Eyes: Conjunctivae are normal. Pupils are  equal, round, and reactive to light.  Neck: Normal range of motion.  Cardiovascular: Regular rhythm and normal heart sounds.   Respiratory: Effort normal.  GI: Soft.  Musculoskeletal: Normal range of motion.  Neurological: He is alert.  Skin: Skin is warm and dry.  Psychiatric: His affect is blunt. His speech is delayed. He is slowed. Thought content is not paranoid and not delusional. Cognition and memory are normal. He expresses impulsivity.    Review of Systems  Constitutional: Negative.   HENT: Negative.   Eyes: Negative.   Cardiovascular: Negative.   Gastrointestinal: Negative.   Genitourinary: Negative.   Musculoskeletal: Negative.   Skin: Negative.   Neurological: Negative.   Endo/Heme/Allergies: Negative.   Psychiatric/Behavioral: Positive for depression. Negative for hallucinations.    Blood pressure 112/72, pulse 79, temperature 97.8 F (36.6 C), temperature source Oral, resp. rate 18, height 5' 8" (1.727 m), weight 173 lb  (78.5 kg), SpO2 98 %.Body mass index is 26.3 kg/m.  General Appearance: Disheveled  Eye Contact:  Poor  Speech:  Slow  Volume:  Decreased  Mood:  Dysphoric  Affect:  Blunt  Thought Process:  Linear  Orientation:  Full (Time, Place, and Person)  Thought Content:  Paranoid Ideation  Suicidal Thoughts:  No  Homicidal Thoughts:  No  Memory:  Immediate;   Fair Recent;   Fair Remote;   Fair  Judgement:  Impaired  Insight:  Lacking  Psychomotor Activity:  Decreased  Concentration:  Concentration: Fair and Attention Span: Fair  Recall:  [employer] of Knowledge:  Fair  Language:  Good  Akathisia:  No  Handed:  Right  AIMS (if indicated):     Assets:  Housing Physical Health  ADL's:  Intact  Cognition:  WNL  Sleep:  Number of Hours: 7.3     Treatment Plan Summary:  Mr. Journey 41 year old male with prior diagnosis of schizoaffective disorder who presented to the emergency room with vague suicidal thoughts as well as paranoid thoughts. He does. Be responding to internal stimuli and has endorsed some auditory and visual hallucinations. He will be admitted to inpatient psychiatry for medication management, safety and stabilization.  1. Schizoaffective disorder, depressive type.   Continue Clozapine 130m at bedtime.  2. ECT. Patient had ECT on Friday,11/13/2016 for the first time in this hospitalization.  Patient had 3 ECT treatments so far  3. Metabolic syndrome monitoring. Lipid panel is normal, hemoglobin A1c 103.  4. EKG. Normal sinus rhythm, QTc 417.  5. Questionable history of polysubstance abuse: The patient denies any history of any polysubstance use but records indicate that there are may have been a history of polysubstance use. He was advised to abstain from alcohol and all illicit drugs as it may worsen anxiety mood symptoms as well as psychosis. The patient is not interested in any substance abuse treatment.  6. Nicotine use disorder. Nicotine patch is available.    7. Diabetes. He is on Metformin, Novolog 10 units with meals and Lantus 55 units, along with ADA diet and blood sugar monitoring. Medicine input is greatly appreciated. Glucerna is discontinued.  8. Hypertension: Vital signs are stable. We'll continue lisinopril 20 mg daily.  9. Seasonal allergies: Continue Singulair 10 mg nightly  10. Anxiety/restlessness. He is on Propranolol.   11. Insomnia. We discontinued Trazodone and started Restoril. Slept 7.5 hours.  12. Back pain. We will give Robaxin.  13. Disposition: The patient will return to a group home. He will follow up with [facility] [place] [facility].    Elvin So, MD 11/28/2016, 9:53 AM

## 2016-11-28 NOTE — Progress Notes (Signed)
Hypoglycemic Event  CBG: 69  Treatment: 15 GM carbohydrate snack  Symptoms: None  Follow-up CBG: 103 Time:5:05pm   Possible Reasons for Event: Unknown  Comments/MD notified: Patient's Blood Glucose at was 4:40pm was 69. Patient was given a 15gm carbohydrate snack and Blood Glucose rechecked at 1705 and was 108. Sliding scale insulin was held due to set parameters but standing dose(10 units) of Novolog was given after meal. Patient ate 100% of his dinner and drank all of his Glucerna.     Tony Cunningham

## 2016-11-29 LAB — GLUCOSE, CAPILLARY
GLUCOSE-CAPILLARY: 133 mg/dL — AB (ref 65–99)
GLUCOSE-CAPILLARY: 242 mg/dL — AB (ref 65–99)
GLUCOSE-CAPILLARY: 236 mg/dL — AB (ref 65–99)
Glucose-Capillary: 188 mg/dL — ABNORMAL HIGH (ref 65–99)

## 2016-11-29 NOTE — Progress Notes (Signed)
EVS leaving patient's room after cleaning the bathroom, mopping the floors after nurse request. Pt is observed coming from his bathroom back to bed with feces on his feet, tracking feces on the floor. Cleaned feces off the floor. Set patient up for shower with clean towels, hygiene products, clean scrubs/underwear/socks. Pt did get in the shower with much encouragement and it is expected that he independently washed himself. Soap smell filled the room. This nurse changed pt's bed linens-removed soiled linens (feces noted on linens too) and applied clean linens. Pt expressed appreciation with assistance.

## 2016-11-29 NOTE — Plan of Care (Signed)
Problem: Coping: Goal: Ability to use eye contact when communicating with others will improve Outcome: Progressing Pt communicates appropriately

## 2016-11-29 NOTE — Progress Notes (Signed)
Patient is visible in the milieu, calm and cooperative. Alert and oriented and able to express his needs and concerns. Patient denying thoughts of self harm. Denies hallucinations. Interacting with staff and peers appropriately. Was encouraged to talk to staff as needed. Therapeutic milieu promoted. Safety precautions maintained.

## 2016-11-29 NOTE — Progress Notes (Signed)
D: Pt denies SI/HI/AVH. Pt is pleasant and cooperative. Pt stated he was doing better, pt appeared more coherent than yesterday. Pt seen in the dayroom and interacting with peers in the dayroom at times.   A: Pt was offered support and encouragement. Pt was given scheduled medications. Pt was encourage to attend groups. Q 15 minute checks were done for safety.   R: Pt attends groups and interacts well with peers and staff. Pt is taking medication. Pt has no complaints at this time .Pt receptive to treatment and safety maintained on unit.

## 2016-11-29 NOTE — Plan of Care (Signed)
Problem: Coping: Goal: Participation in decision-making will improve Outcome: Progressing Pt able to complete self inventories Able to discuss medication regime

## 2016-11-29 NOTE — Progress Notes (Signed)
Pt with body and room odor, appears disheveled. Provided cleaning of pt's room: removed dirty towels/linens from floor, wiped down bathroom with cleansing wipes, removed trash. Put pt's clothing in washer to wash, will return when clean. Will encourage shower once pt's own clothing is clean and will change linens on bed as well. EVS request made to thoroughly clean pt's bathroom and floors as they are sticky and possibly a reason for smell. Safety maintained. Will continue to monitor.

## 2016-11-29 NOTE — [facility] Group Notes (Signed)
[facility] LCSW Group Therapy  11/29/2016 2:18 PM  Type of Therapy:  Group Therapy  Participation Level:  Patient did not attend group. CSW invited patient to group.   Summary of Progress/Problems: Self esteem: Patients discussed self esteem and how it impacts them. They discussed what aspects in their lives has influenced their self esteem. They were challenged to identify changes that are needed in order to improve self esteem. Patients participated in activity where they had to identify positive adjectives they felt described their personality. Patients shared with the group on the following areas: Things I am good at, What I like about my appearance, I've helped others by, What I value the most, compliments I have received, challenges I have overcome, thing that make me unique, and Times I've made others happy.   Cullan Launer G. Garnette Czech MSW, LCSWA 11/29/2016, 2:18 PM

## 2016-11-29 NOTE — Progress Notes (Signed)
[facility] MD Progress Note  11/29/2016 9:51 AM Tony Cunningham  MRN:  [id number]  Subjective:  Tony Cunningham is a 41 year old male with prior diagnosis of schizoaffective disorder. He received ECT to be continued next week if appropriate.  He was started on Clozapine and his dose was titrated to 225 mg. He is also on Luvox. Clozapine level was quite high with resulting sedation, because of the combination. Unfortunately in ERROR his Clozapine was discotoniued after Monday. I restarted 100 mg of Clozapine tonight. Actually, he is doing well without it, less sedated. Not knowing he was off Clozapine I took another level Thursday night.  11/08/16. The patient has been very isolative and withdrawn. Hygiene is poor and he is malodorous. He has not showered. He did go outside with the other patients and has been getting up for meals but not attending groups or interacting with staff or peers. He denies any current active or passive suicidal thoughts but does admit to some very vague paranoid thoughts that others are watching him. He is very limited and talking with this Probation officer and wanted to be "left alone to sleep". He denied any current somatic complaints. Vital signs are stable and he slept fairly well last night but is also sleeping partially during the daytime. So far, he is tolerating the Clozaril fairly well without any physical adverse side effects.  Follow-up for this 42 year old man with what appears to be psychotic depression or schizoaffective disorder. Patient remains in bed most of the time. Poorly interactive. Mood is depressed. Passive suicidal thoughts.  Follow-up for Thursday the 21st. Patient remains withdrawn very little activity. Blood sugars are also running much higher. Patient continues to endorse feeling depressed and having vague suicidal thoughts.  Follow-up for Friday the 22nd. Patient finally had ECT this morning bilateral treatment which went off without any difficulty or complication. I saw him  this afternoon and I think he actually was a little more interactive and verbal and energetic than before. This may be wishful thinking but it makes me optimistic. No specific complaints other than headache  Patient was seen today for follow-up. Patient has no new complaints. Continues to say his thoughts are racing but his affect is flat and withdrawn he is almost completely withdrawn from other people and stays in his room by himself. Suicidal thoughts passive no intention or plan. Still has psychotic symptoms. Blood sugars unfortunately continued to run quite high.  6/23 patient denies having any issues or concerns today. Says that he has been eating and sleeping well. Denies side effects from medications or any physical complaints. Denies suicidality or homicidality. He says that he might be hallucinating but he is not sure. He said that he had 1 ECT procedure and it went well.   Blood glucose this morning was 92. Patient is compliant with medications. No major events overnight per nursing.  6/24 patient did not sleep well last night. Other than that he denies having any issues or concerns. He denies suicidality, homicidality or auditory visual hallucinations. During examination he appears much brighter and more conversational. His thought processes are linear.  11/16/2016. Tony Cunningham does not like talking to me today. He is in his room he is in bed He does not open his eyes for me and is unable to keep a conversation he keeps telling me okay okay and is unwilling to answer any questions. He received just one ECT treatment last week. Since Dr. Weber Cooks is on vacation, ECT will resume next week.  He  accepts medications and seems to tolerate them well Clozaril level is over 700. This is probably from a combination of clozapine and Luvox. I will alert dose of clozapine to 200 mg  To avoid side effects. I will switch to Luvox to 1 nightly dose to improve sleep.  11/17/2016. Tony Cunningham is much more animated and  interactive today. He is in his room in bed bugs is able to hold a conversation and maintains good eye contact. He complains of stomach ache. He did have a bowel movement this morning. His mood is still depressed and he needs much encouragement to attend to his ADLs. Staff needs to strongly encourage showering. He has high hopes for ECT and will not stay in the hospital until next week to resume ECT with Dr. Weber Cooks Is back from vacation. Yesterday he is agreeable to the nighttime and clozapine dose decrease due to high clozapine level. This could have helped the patient stated more awake this morning. He slept almost 6 hours.  11/18/2016. Tony Cunningham seems more relaxed and talkative today. He is still very flat on his back but opens his eyes for me to we are having a small conversation. He has not been sleeping well. There are in spite of medication adjustments. He believes that trazodone works well for him. I will start trazodone tonight. She no longer complains of stomach pain. He does not participate in programming but takes medications with no problem. She met with the group home representative this week and was accepted to a group home with a bed open at the beginning of July. When asked returns from vacation, he will decide whether to continue ECT or whether the patient will be discharged.  11/19/2016. Tony Cunningham seems better today. This morning he has been up and walking around. He complains of "tension" in his head both mental and physical. He slept 3 hours only again in spite of multiple sedating medications given at night including Trazodone. I will switch him to Restoril tonight. It may need to be discontinued if her restarts ECT next week. No other somatic complaints. Sugers still elevated at times. I changed diet to ADA and discontinued Glucerna. Input from diabetes nurse coordinator and medicine consultant is greatly appreciated. He does not interact with staff/peers or participate in programing. Hygiene  is still questionable.  11/20/2016. Tony Cunningham is up and about today. He comes to my office for interview. He seems more relaxed today. Still very vague in his answers. Complains of back and neck pain but has been in bed on his back most of the week. His sugar was low today as he was switched to ADA diet and Glucerna was discontinued upon [facility] suggestion. He is looking forward to see Dr. Weber Cooks "who is the best". He corrects himself to tell me that I am not bad either.  Follow-up for June 30. Patient seen. Spoke with nursing. Patient continues to report feeling bad. Mood feels depressed. Denies acute suicidal thoughts but feels hopeless about the future. Feels paranoid and vaguely report psychosis. Patient continues to have poor self-care and poor interaction with others on the unit. Physically he seems to be stabilizing a little.  Follow-up Sunday, July 1. Patient seen. He looks a little better groomed today. He has been up out of his bed although he does not attend any groups. He is ambivalent about his mood today. Denies acute hallucinations or suicidal thinking. Still feels hopeless about discharge.  Patient seen for follow-up on Monday, July 2. He  had ECT treatment this morning which was complicated by some delirium and agitation during the recovery phase. Otherwise treatment went fine. Seen this afternoon the patient is still a little groggy from the medicines he received. No specific physical complaints however. Mood is still depressed and he is still slow and cognitively impaired. Denies acute suicidal intent. Sugars continue to be very labile. He is requesting a restart of his Glucerna but I am unsure whether that is appropriate.  [place] Tuesday, July 3. Patient has no new complaints. In fact today he seems to think he is doing a little bit better. His affect looked brighter and he was more conversant with me. He is taking some steps to improve his appearance.  Follow-up for Wednesday, July 4.  Patient seen. His only complaint today is wanting to have the dietary supplement Glucerna shake back. He thinks his mood is feeling better. He lets me know that he has been out of bed watching TV and being around people. Hallucinations are minimal. Denies suicidal thoughts. Admits that he is feeling a little more optimistic about going home with his family.  Follow-up for Thursday, July 5. Patient says he is feeling a little better than yesterday. Still feels anxious much of the time. He has made the effort to go to groups a little bit. Thought still are scattered and a little disorganized. Stays withdrawn most of the time. Denies however having active suicidal thoughts. Sugars continue to be labile but nothing terrible  Follow-up for Friday the sixth. Patient had ECT today which was tolerated well although he still has some agitation during recovery. Overall however his mood is improving. He is more interactive makes better eye contact and has managed to get up and be around other patients better. Denies any suicidal thoughts currently. Still feels confused and gets very nervous but denies he's having hallucinations.  11/28/2016- Patient continues to report being depressed. Has poor hygiene. Patient with strong malodor. He has not been attending any programming. Denies suicidal thoughts. Patient had 3 ECT treatments so far.  11/29/2016 Patient sleeping in his room. Continues to just lie in his bed, not attending to his hygiene despite his nurse coaxing him. His room has been cleaned and all sheets changed.   Per nursing: Pt calm, cooperative and pleasant.  AM BS 133.  Speech is more clear and logical, though remains hypoverbal.  Remains frequently isolative to room.  No behavioral issues.    Principal Problem: Schizoaffective disorder, depressive type ([place]) Diagnosis:   Patient Active Problem List   Diagnosis Date Noted  . Schizoaffective disorder, depressive type ([place]) [F25.1] 09/23/2016  .  Polysubstance abuse [F19.10] 07/17/2016  . Tardive dyskinesia [G24.01] 07/16/2016  . Tobacco use disorder [F17.200] 07/07/2016  . Dyslipidemia [E78.5] 07/07/2016  . Asthma [J45.909] 07/07/2016  . HTN (hypertension) [I10] 07/06/2016  . Diabetes ([place]) [E11.9] 12/25/2010   Total Time spent with patient: 20 minutes    Past Medical History:  Past Medical History:  Diagnosis Date  . Anxiety   . Asthma   . Diabetes mellitus   . High blood pressure   . Sinus complaint    History reviewed. No pertinent surgical history.  Social History:  History  Alcohol Use No     History  Drug Use No    Social History   Social History  . Marital status: Single    Spouse name: [name]  . Number of children: [name]  . Years of education: [name]   Social History Main Topics  .  Smoking status: Current Every Day Smoker    Packs/day: 0.50    Types: Cigarettes  . Smokeless tobacco: Never Used  . Alcohol use No  . Drug use: No  . Sexual activity: Not Currently   Other Topics Concern  . None   Social History Narrative  . None   Additional Social History:                         Sleep: Good  Appetite:  Good  Current Medications: Current Facility-Administered Medications  Medication Dose Route Frequency Provider Last Rate Last Dose  . acetaminophen (TYLENOL) tablet 650 mg  650 mg Oral Q6H PRN [name], Madie Reno, MD   650 mg at 11/19/16 1739  . alum & mag hydroxide-simeth (MAALOX/MYLANTA) 200-200-20 MG/5ML suspension 30 mL  30 mL Oral Q4H PRN [name], [name], MD      . cloZAPine (CLOZARIL) tablet 100 mg  100 mg Oral QHS [name], [name], MD   100 mg at 11/28/16 2156  . dextrose 5 %-0.45 % sodium chloride infusion   Intravenous Continuous [name], Madie Reno, MD 10 mL/hr at 11/13/16 1009    . feeding supplement (GLUCERNA SHAKE) (GLUCERNA SHAKE) liquid 237 mL  237 mL Oral TID WC [name], [name], MD   237 mL at 11/29/16 0800  . fentaNYL (SUBLIMAZE) injection 25 mcg  25 mcg Intravenous Q5  min PRN Gunnar Bulla, MD      . fluvoxaMINE (LUVOX) tablet 100 mg  100 mg Oral QHS [name], [name], MD   100 mg at 11/28/16 2156  . insulin aspart (novoLOG) injection 0-15 Units  0-15 Units Subcutaneous TID WC Loletha Grayer, MD   3 Units at 11/29/16 0741  . insulin aspart (novoLOG) injection 0-5 Units  0-5 Units Subcutaneous QHS Loletha Grayer, MD   4 Units at 11/28/16 2158  . insulin aspart (novoLOG) injection 10 Units  10 Units Subcutaneous TID WC Fritzi Mandes, MD   10 Units at 11/29/16 0741  . insulin glargine (LANTUS) injection 55 Units  55 Units Subcutaneous QHS Fritzi Mandes, MD   55 Units at 11/28/16 2158  . lisinopril (PRINIVIL,ZESTRIL) tablet 20 mg  20 mg Oral Daily [name], Madie Reno, MD   20 mg at 11/29/16 0742  . magnesium hydroxide (MILK OF MAGNESIA) suspension 30 mL  30 mL Oral Daily PRN [name], [name], MD      . meloxicam (MOBIC) tablet 7.5 mg  7.5 mg Oral BID PC [name], [name], MD   7.5 mg at 11/29/16 0742  . metFORMIN (GLUCOPHAGE) tablet 1,000 mg  1,000 mg Oral BID WC [name], Madie Reno, MD   1,000 mg at 11/29/16 0742  . montelukast (SINGULAIR) tablet 10 mg  10 mg Oral QHS [name], [name], MD   10 mg at 11/28/16 2156  . nicotine (NICODERM CQ - dosed in mg/24 hr) patch 7 mg  7 mg Transdermal Daily Chauncey Mann, MD   7 mg at 11/28/16 0850  . ondansetron (ZOFRAN) injection 4 mg  4 mg Intravenous Once PRN Gunnar Bulla, MD      . pantoprazole (PROTONIX) EC tablet 40 mg  40 mg Oral Daily [name], [name], MD   40 mg at 11/29/16 0742  . polyethylene glycol (MIRALAX / GLYCOLAX) packet 17 g  17 g Oral Daily [name], Madie Reno, MD   17 g at 11/29/16 0742  . promethazine (PHENERGAN) tablet 12.5 mg  12.5 mg Oral Q6H PRN [name],  Madie Reno, MD      . propranolol (INDERAL) tablet 20 mg  20 mg Oral BID [name], Madie Reno, MD   20 mg at 11/29/16 0742  . simvastatin (ZOCOR) tablet 40 mg  40 mg Oral q1800 [name], Madie Reno, MD   40 mg at 11/28/16 1711  . temazepam (RESTORIL) capsule 15 mg  15 mg  Oral QHS [name], [name], MD   15 mg at 11/28/16 2156    Lab Results:  Results for orders placed or performed during the hospital encounter of 11/06/16 (from the past 48 hour(s))  Glucose, capillary     Status: Abnormal   Collection Time: 11/27/16 11:37 AM  Result Value Ref Range   Glucose-Capillary 126 (H) 65 - 99 mg/dL  Glucose, capillary     Status: Abnormal   Collection Time: 11/27/16  1:22 PM  Result Value Ref Range   Glucose-Capillary 118 (H) 65 - 99 mg/dL  Glucose, capillary     Status: Abnormal   Collection Time: 11/27/16  4:34 PM  Result Value Ref Range   Glucose-Capillary 115 (H) 65 - 99 mg/dL  Glucose, capillary     Status: Abnormal   Collection Time: 11/27/16  8:52 PM  Result Value Ref Range   Glucose-Capillary 374 (H) 65 - 99 mg/dL  Glucose, capillary     Status: Abnormal   Collection Time: 11/28/16  6:47 AM  Result Value Ref Range   Glucose-Capillary 190 (H) 65 - 99 mg/dL  Glucose, capillary     Status: Abnormal   Collection Time: 11/28/16 11:28 AM  Result Value Ref Range   Glucose-Capillary 213 (H) 65 - 99 mg/dL  Glucose, capillary     Status: None   Collection Time: 11/28/16  4:40 PM  Result Value Ref Range   Glucose-Capillary 69 65 - 99 mg/dL  Glucose, capillary     Status: Abnormal   Collection Time: 11/28/16  5:05 PM  Result Value Ref Range   Glucose-Capillary 108 (H) 65 - 99 mg/dL  Glucose, capillary     Status: Abnormal   Collection Time: 11/28/16  7:48 PM  Result Value Ref Range   Glucose-Capillary 302 (H) 65 - 99 mg/dL  Glucose, capillary     Status: Abnormal   Collection Time: 11/29/16  6:52 AM  Result Value Ref Range   Glucose-Capillary 133 (H) 65 - 99 mg/dL    Blood Alcohol level:  Lab Results  Component Value Date   ETH <5 10/21/2016   ETH <5 62/26/3335    Metabolic Disorder Labs: Lab Results  Component Value Date   HGBA1C 10.3 (H) 11/09/2016   MPG 249 11/09/2016   MPG 275 10/09/2016   Lab Results  Component Value Date    PROLACTIN 24.5 (H) 09/24/2016   PROLACTIN 3.4 (L) 07/07/2016   Lab Results  Component Value Date   CHOL 92 11/09/2016   TRIG 54 11/09/2016   HDL 39 (L) 11/09/2016   CHOLHDL 2.4 11/09/2016   VLDL 11 11/09/2016   LDLCALC 42 11/09/2016   LDLCALC 72 10/09/2016    Physical Findings: AIMS: Facial and Oral Movements Muscles of Facial Expression: Minimal Lips and Perioral Area: Minimal Jaw: None, normal Tongue: None, normal,Extremity Movements Upper (arms, wrists, hands, fingers): None, normal Lower (legs, knees, ankles, toes): Mild, Trunk Movements Neck, shoulders, hips: None, normal, Overall Severity Severity of abnormal movements (highest score from questions above): Mild Incapacitation due to abnormal movements: None, normal Patient's awareness of abnormal movements (rate only patient's report): No Awareness,  Dental Status Current problems with teeth and/or dentures?: No Does patient usually wear dentures?: No   Musculoskeletal: Strength & Muscle Tone: within normal limits Gait & Station: normal Patient leans: [name]  Psychiatric Specialty Exam: Physical Exam  Nursing note and vitals reviewed. Constitutional: He appears well-developed and well-nourished.  HENT:  Head: Normocephalic and atraumatic.  Eyes: Conjunctivae are normal. Pupils are equal, round, and reactive to light.  Neck: Normal range of motion.  Cardiovascular: Regular rhythm and normal heart sounds.   Respiratory: Effort normal.  GI: Soft.  Musculoskeletal: Normal range of motion.  Neurological: He is alert.  Skin: Skin is warm and dry.  Psychiatric: His affect is blunt. His speech is delayed. He is slowed. Thought content is not paranoid and not delusional. Cognition and memory are normal. He expresses impulsivity.    Review of Systems  Constitutional: Negative.   HENT: Negative.   Eyes: Negative.   Cardiovascular: Negative.   Gastrointestinal: Negative.   Genitourinary: Negative.   Musculoskeletal:  Negative.   Skin: Negative.   Neurological: Negative.   Endo/Heme/Allergies: Negative.   Psychiatric/Behavioral: Positive for depression. Negative for hallucinations.    Blood pressure 113/70, pulse 74, temperature 98.2 F (36.8 C), temperature source Oral, resp. rate 18, height _0  (1.727 m), weight 173 lb (78.5 kg), SpO2 100 %.Body mass index is 26.3 kg/m.  General Appearance: Disheveled  Eye Contact:  Poor  Speech:  Slow  Volume:  Decreased  Mood:  Dysphoric  Affect:  Blunt  Thought Process:  Linear  Orientation:  Full (Time, Place, and Person)  Thought Content:  Paranoid Ideation  Suicidal Thoughts:  No  Homicidal Thoughts:  No  Memory:  Immediate;   Fair Recent;   Fair Remote;   Fair  Judgement:  Impaired  Insight:  Lacking  Psychomotor Activity:  Decreased  Concentration:  Concentration: Fair and Attention Span: Fair  Recall:  [employer] of Knowledge:  Fair  Language:  Good  Akathisia:  No  Handed:  Right  AIMS (if indicated):     Assets:  Housing Physical Health  ADL's:  Intact  Cognition:  WNL  Sleep:  Number of Hours: 6     Treatment Plan Summary:  Tony Cunningham 41 year old male with prior diagnosis of schizoaffective disorder who presented to the emergency room with vague suicidal thoughts as well as paranoid thoughts. He does. Be responding to internal stimuli and has endorsed some auditory and visual hallucinations. He will be admitted to inpatient psychiatry for medication management, safety and stabilization.  1. Schizoaffective disorder, depressive type.   Continue Clozapine 161m at bedtime.  2. ECT. Patient had ECT on Friday,11/13/2016 for the first time in this hospitalization.  Patient had 3 ECT treatments so far  3. Metabolic syndrome monitoring. Lipid panel is normal, hemoglobin A1c 103.  4. EKG. Normal sinus rhythm, QTc 417.  5. Questionable history of polysubstance abuse: The patient denies any history of any polysubstance use but records  indicate that there are may have been a history of polysubstance use. He was advised to abstain from alcohol and all illicit drugs as it may worsen anxiety mood symptoms as well as psychosis. The patient is not interested in any substance abuse treatment.  6. Nicotine use disorder. Nicotine patch is available.   7. Diabetes. He is on Metformin, Novolog 10 units with meals and Lantus 55 units, along with ADA diet and blood sugar monitoring. Medicine input is greatly appreciated. Glucerna is discontinued.  8. Hypertension: Vital signs are  stable. We'll continue lisinopril 20 mg daily.  9. Seasonal allergies: Continue Singulair 10 mg nightly  10. Anxiety/restlessness. He is on Propranolol.   11. Insomnia. We discontinued Trazodone and started Restoril. Slept 7.5 hours.  12. Back pain. We will give Robaxin.  13. Disposition: The patient will return to a group home. He will follow up with [facility] in [facility]. Consider ACT team.    Elvin So, MD 11/29/2016, 9:51 AM

## 2016-11-29 NOTE — Plan of Care (Signed)
Problem: Coping: Goal: Ability to interact with others will improve Outcome: Progressing Pt is seen more and more in the dayroom with encouragements

## 2016-11-29 NOTE — [facility] Group Notes (Signed)
[facility] Group Notes:  (Nursing/MHT/Case Management/Adjunct)  Date:  11/29/2016  Time:  7:25 AM  Type of Therapy:  Psychoeducational Skills  Participation Level:  Did Not Attend  Summary of Progress/Problems:  Chancy Milroy 11/29/2016, 7:25 AM

## 2016-11-29 NOTE — Plan of Care (Signed)
Problem: Coping: Goal: Ability to identify and develop effective coping behavior will improve Outcome: Progressing Able to talk to staff as needed Attending groups

## 2016-11-30 ENCOUNTER — Inpatient Hospital Stay: Payer: Medicare Other | Admitting: Anesthesiology

## 2016-11-30 ENCOUNTER — Telehealth (HOSPITAL_COMMUNITY): Payer: Self-pay | Admitting: *Deleted

## 2016-11-30 ENCOUNTER — Inpatient Hospital Stay: Payer: Medicare Other

## 2016-11-30 ENCOUNTER — Other Ambulatory Visit: Payer: Self-pay | Admitting: Psychiatry

## 2016-11-30 LAB — GLUCOSE, CAPILLARY
GLUCOSE-CAPILLARY: 159 mg/dL — AB (ref 65–99)
Glucose-Capillary: 223 mg/dL — ABNORMAL HIGH (ref 65–99)
Glucose-Capillary: 140 mg/dL — ABNORMAL HIGH (ref 65–99)
Glucose-Capillary: 152 mg/dL — ABNORMAL HIGH (ref 65–99)
Glucose-Capillary: 145 mg/dL — ABNORMAL HIGH (ref 65–99)

## 2016-11-30 MED ORDER — SUCCINYLCHOLINE CHLORIDE 20 MG/ML IJ SOLN
INTRAMUSCULAR | Status: AC
  Filled 2016-11-30: qty 1

## 2016-11-30 MED ORDER — GLYCOPYRROLATE 0.2 MG/ML IJ SOLN: 0.2000 mg | Freq: Once | INTRAMUSCULAR | Status: DC

## 2016-11-30 MED ORDER — OXYCODONE HCL 5 MG/5ML PO SOLN: 5.0000 mg | Freq: Once | ORAL | Status: DC | PRN

## 2016-11-30 MED ORDER — OXYCODONE HCL 5 MG PO TABS: 5.0000 mg | ORAL_TABLET | Freq: Once | ORAL | Status: DC | PRN

## 2016-11-30 MED ORDER — SODIUM CHLORIDE 0.9 % IV SOLN
INTRAVENOUS | Status: DC | PRN
  Administered 2016-11-30: 11:00:00 via INTRAVENOUS

## 2016-11-30 MED ORDER — MIDAZOLAM HCL 2 MG/2ML IJ SOLN
INTRAMUSCULAR | Status: AC
  Filled 2016-11-30: qty 4

## 2016-11-30 MED ORDER — METHOHEXITAL SODIUM 100 MG/10ML IV SOSY
PREFILLED_SYRINGE | INTRAVENOUS | Status: DC | PRN
  Administered 2016-11-30: 80 mg via INTRAVENOUS

## 2016-11-30 MED ORDER — FENTANYL CITRATE (PF) 100 MCG/2ML IJ SOLN: 25.0000 ug | INTRAMUSCULAR | Status: DC | PRN

## 2016-11-30 MED ORDER — MIDAZOLAM HCL 2 MG/2ML IJ SOLN
4.0000 mg | Freq: Once | INTRAMUSCULAR | Status: AC
  Administered 2016-11-30: 4 mg via INTRAVENOUS

## 2016-11-30 MED ORDER — SUCCINYLCHOLINE CHLORIDE 20 MG/ML IJ SOLN
INTRAMUSCULAR | Status: DC | PRN
  Administered 2016-11-30: 100 mg via INTRAVENOUS

## 2016-11-30 MED ORDER — KETOROLAC TROMETHAMINE 30 MG/ML IJ SOLN
INTRAMUSCULAR | Status: AC
  Administered 2016-11-30: 30 mg
  Filled 2016-11-30: qty 1

## 2016-11-30 MED ORDER — HALOPERIDOL LACTATE 5 MG/ML IJ SOLN
INTRAMUSCULAR | Status: AC
Start: 2016-11-30 — End: 2016-11-30
  Filled 2016-11-30: qty 1

## 2016-11-30 MED ORDER — HALOPERIDOL LACTATE 5 MG/ML IJ SOLN
INTRAMUSCULAR | Status: DC | PRN
  Administered 2016-11-30: 5 mg via INTRAVENOUS

## 2016-11-30 MED ORDER — SODIUM CHLORIDE 0.9 % IV SOLN
500.0000 mL | Freq: Once | INTRAVENOUS | Status: AC
  Administered 2016-11-30: 500 mL via INTRAVENOUS

## 2016-11-30 NOTE — Anesthesia Post-op Follow-up Note (Cosign Needed)
Anesthesia QCDR form completed.        

## 2016-11-30 NOTE — Anesthesia Preprocedure Evaluation (Addendum)
Anesthesia Evaluation  Patient identified by MRN, date of birth, ID band Patient awake    Reviewed: Allergy & Precautions, NPO status , Patient's Chart, lab work & pertinent test results  History of Anesthesia Complications Negative for: history of anesthetic complications  Airway Mallampati: II  TM Distance: >3 FB Neck ROM: Full    Dental  (+) Poor Dentition, Chipped   Pulmonary asthma , Current Smoker,    breath sounds clear to auscultation- rhonchi (-) wheezing      Cardiovascular hypertension, Pt. on medications (-) CAD, (-) Past MI and (-) Cardiac Stents  Rhythm:Regular Rate:Normal - Systolic murmurs and - Diastolic murmurs    Neuro/Psych PSYCHIATRIC DISORDERS Anxiety Depression Schizophrenia negative neurological ROS     GI/Hepatic negative GI ROS, Neg liver ROS,   Endo/Other  diabetes, Insulin Dependent  Renal/GU negative Renal ROS     Musculoskeletal negative musculoskeletal ROS (+)   Abdominal (+) - obese,   Peds  Hematology negative hematology ROS (+)   Anesthesia Other Findings Signs and symptoms suggestive of sleep apnea   Past Medical History: No date: Anxiety No date: Asthma No date: Diabetes mellitus No date: High blood pressure No date: Sinus complaint  History reviewed. No pertinent surgical history.   Reproductive/Obstetrics                             Anesthesia Physical  Anesthesia Plan  ASA: III  Anesthesia Plan: General   Post-op Pain Management:    Induction: Intravenous  PONV Risk Score and Plan:   Airway Management Planned: Mask  Additional Equipment:   Intra-op Plan:   Post-operative Plan:   Informed Consent: I have reviewed the patients History and Physical, chart, labs and discussed the procedure including the risks, benefits and alternatives for the proposed anesthesia with the patient or authorized representative who has indicated  his/her understanding and acceptance.   Dental advisory given  Plan Discussed with: CRNA and Anesthesiologist  Anesthesia Plan Comments: (Patient consented for risks of anesthesia including but not limited to:  - adverse reactions to medications - damage to teeth, lips or other oral mucosa - sore throat or hoarseness - Damage to heart, brain, lungs or loss of life  Patient voiced understanding.)        Anesthesia Quick Evaluation  

## 2016-11-30 NOTE — Social Work (Signed)
Per Lajoyce Corners at [facility], MD has declined patient and asked that he be referred to ACT team.  Santa Genera, LCSW Lead Clinical Social Worker Phone:  [phone]

## 2016-11-30 NOTE — Progress Notes (Signed)
D: Pt denies SI/HI/AVH. Pt is pleasant and cooperative. Pt seen interacting minimally with peers in the dayroom, pt stated he was feeling a little better this evening, pt said he will be staying with his mom on D/C, but was concerned that financially it was going to be a strain on his mother.   A: Pt was offered support and encouragement. Pt was given scheduled medications. Pt was encourage to attend groups. Q 15 minute checks were done for safety.   R:Pt attends groups and interacts well with peers and staff. Pt is taking medication. Pt has no complaints at this time .Pt receptive to treatment and safety maintained on unit.

## 2016-11-30 NOTE — Plan of Care (Signed)
Problem: Safety: Goal: Periods of time without injury will increase Outcome: Progressing No injuries sustained at this time.

## 2016-11-30 NOTE — [facility] Group Notes (Signed)
[facility] Group Notes:  (Nursing/MHT/Case Management/Adjunct)  Date:  11/30/2016  Time:  6:17 AM  Type of Therapy:  Psychoeducational Skills  Participation Level:  Active  Participation Quality:  Appropriate, Attentive, Sharing and Supportive  Affect:  Appropriate  Cognitive:  Appropriate  Insight:  Appropriate and Good  Engagement in Group:  Engaged and Improving  Modes of Intervention:  Discussion, Socialization and Support  Summary of Progress/Problems:  Tony Cunningham 11/30/2016, 6:17 AM

## 2016-11-30 NOTE — Progress Notes (Signed)
Patient is alert and oriented to person, place and time. Skin is warm, dry and intact. No limitations to all four extremities noted. Patient currently denies SI at this time. Medication is taken by patient without difficulty nor noted side affects. Patient was observed ambulating in hall during the shift with a steady gait. Attends meals in dayroom with minimal peer interaction noted. VS WNL, milieu remains therapeutic. Patient will be monitored and physician notified of any acute changes.

## 2016-11-30 NOTE — Anesthesia Procedure Notes (Signed)
Performed by: Karlin Binion Pre-anesthesia Checklist: Patient identified, Emergency Drugs available, Suction available and Patient being monitored Patient Re-evaluated:Patient Re-evaluated prior to inductionOxygen Delivery Method: Circle system utilized Preoxygenation: Pre-oxygenation with 100% oxygen Intubation Type: IV induction Ventilation: Mask ventilation without difficulty and Mask ventilation throughout procedure Airway Equipment and Method: Bite block Placement Confirmation: positive ETCO2 Dental Injury: Teeth and Oropharynx as per pre-operative assessment        

## 2016-11-30 NOTE — Telephone Encounter (Signed)
referral received from Sallee Lange LCSW from [facility]. Per provider review, provider believes this patient will be a candidate for ACT team. Per provider to inform Santa Genera to ref pt to [facility]. Spoke with Thurston Hole and informed her.

## 2016-11-30 NOTE — Anesthesia Postprocedure Evaluation (Signed)
Anesthesia Post Note  Patient: Tony Cunningham  Procedure(s) Performed: * No procedures listed *  Patient location during evaluation: PACU Anesthesia Type: General Level of consciousness: awake and alert Pain management: pain level controlled Vital Signs Assessment: post-procedure vital signs reviewed and stable Respiratory status: spontaneous breathing, nonlabored ventilation, respiratory function stable and patient connected to nasal cannula oxygen Cardiovascular status: blood pressure returned to baseline and stable Postop Assessment: no signs of nausea or vomiting Anesthetic complications: no     Last Vitals:  Vitals:   11/30/16 1145 11/30/16 1155  BP: 120/86 123/90  Pulse: 82 81  Resp: 18 18  Temp:      Last Pain:  Vitals:   11/30/16 1155  TempSrc:   PainSc: 0-No pain                 Cleda Mccreedy Rachal Dvorsky

## 2016-11-30 NOTE — Progress Notes (Signed)
Recreation Therapy Notes  Date: 07.09.18 Time: 9:30 am Location: Craft Room  Group Topic: Self-expression  Goal Area(s) Addresses:  Patient will identify one color per emotion listed on wheel. Patient will verbalize benefit of using art as a means of self-expression. Patient will verbalize one emotion experienced during session.  Patient will be educated on other forms of self-expression.  Behavioral Response: Did not attend  Intervention: Emotion Wheel  Activity: Patients were given an Emotion Wheel worksheet and were instructed to pick a color for each emotion listed on the wheel and color the section in the color they chose.  Education: LRT educated patients on other forms of self-expression.  Education Outcome: Patient did not attend group.   Clinical Observations/Feedback: Patient did not attend group.   Amarisa Wilinski M, LRT/CTRS 11/30/2016 9:58 AM 

## 2016-11-30 NOTE — Procedures (Signed)
ECT SERVICES Physician's Interval Evaluation & Treatment Note  Patient Identification: Tony Cunningham MRN:  [id number] Date of Evaluation:  11/30/2016 TX #: [id number]  MADRS:   MMSE: 23  P.E. Findings:  Patient has clear lungs and heart. Vitals unremarkable.  Psychiatric Interval Note:  No complaints affect flat. Still minimal interaction  Subjective:  Patient is a 41 y.o. male seen for evaluation for Electroconvulsive Therapy. No specific complaint  Treatment Summary:   []   Right Unilateral             [x]  Bilateral   % Energy : 1.0 ms 60%   Impedance: 1760 ohms  Seizure Energy Index: 13,265 V squared  Postictal Suppression Index: 15%  Seizure Concordance Index: 94%  Medications  Pre Shock: Brevital 80 mg succinylcholine 100 mg  Post Shock: Versed 4 mg Haldol 5 mg  Seizure Duration: 36 seconds by EMG 53 seconds by EEG   Comments: Follow-up Wednesday at home   Lungs:  [x]   Clear to auscultation               []  Other:   Heart:    [x]   Regular rhythm             []  irregular rhythm    [x]   Previous H&P reviewed, patient examined and there are NO CHANGES                 []   Previous H&P reviewed, patient examined and there are changes noted.   , MD 7/9/201810:55 AM

## 2016-11-30 NOTE — Plan of Care (Signed)
Problem: Education: Goal: Ability to state activities that reduce stress will improve Outcome: Progressing Pt stated sitting in the dayroom being around people helps him feel better sometimes  Problem: Coping: Goal: Ability to identify and develop effective coping behavior will improve Outcome: Progressing Pt seen in the dayroom some of the evening Goal: Ability to interact with others will improve Outcome: Progressing Pt was in the dayroom watching TV this evening Goal: Ability to use eye contact when communicating with others will improve Outcome: Progressing Pt engaged in conversation with writer this evening and had appropriate eye contact during the entire conversation  Problem: Health Behavior/Discharge Planning: Goal: Identification of resources available to assist in meeting health care needs will improve Outcome: Progressing Pt stated he will be going to stay with his mom on D/C  Problem: Activity: Goal: Sleeping patterns will improve Outcome: Progressing Pt slept over 6 hrs last night

## 2016-11-30 NOTE — Plan of Care (Signed)
Problem: Education: Goal: Ability to state activities that reduce stress will improve Outcome: Progressing Coping mechanisms taught to patient regarding ways to reduce stress.  Problem: Coping: Goal: Ability to interact with others will improve Outcome: Progressing Patient has been observed interacting more with his peers in the dayroom.

## 2016-11-30 NOTE — [facility] Group Notes (Signed)
[facility] Group Notes:  (Nursing/MHT/Case Management/Adjunct)  Date:  11/30/2016  Time:  3:51 PM  Type of Therapy:  Psychoeducational Skills  Participation Level:  Did Not Attend  Twanna Hy 11/30/2016, 3:51 PM

## 2016-11-30 NOTE — H&P (Signed)
Tony Cunningham is an 41 y.o. male.   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 Anxiety   . Asthma   . Diabetes mellitus   . High blood pressure   . Sinus complaint     History reviewed. No pertinent surgical history.  History reviewed. No pertinent family history. Social History:  reports that he has been smoking Cigarettes.  He has been smoking about 0.50 packs per day. He has never used smokeless tobacco. He reports that he does not drink alcohol or use drugs.  Allergies: No Known Allergies  Medications Prior to Admission  Medication Sig Dispense Refill  . cloZAPine (CLOZARIL) 100 MG tablet Take 1 tablet (100 mg total) by mouth daily. Daily at 1700 30 tablet 0  . cyclobenzaprine (FLEXERIL) 10 MG tablet Take 1 tablet (10 mg total) by mouth 3 (three) times daily as needed for muscle spasms. 60 tablet 0  . fluvoxaMINE (LUVOX) 50 MG tablet Take 1 tablet (50 mg total) by mouth daily. Daily at 1700 30 tablet 0  . insulin aspart (NOVOLOG) 100 UNIT/ML injection Inject 5 Units into the skin 3 (three) times daily with meals. 5 mL 0  . insulin glargine (LANTUS) 100 UNIT/ML injection Inject 0.42 mLs (42 Units total) into the skin at bedtime. 13 mL 0  . ipratropium (ATROVENT) 0.06 % nasal spray Place 1 spray into both nostrils daily after supper. 3 mL 0  . lidocaine (LIDODERM) 5 % Place 1 patch onto the skin daily. Remove & Discard patch within 12 hours or as directed by MD 30 patch 0  . lisinopril (PRINIVIL,ZESTRIL) 20 MG tablet Take 1 tablet (20 mg total) by mouth daily. 30 tablet 0  . meloxicam (MOBIC) 7.5 MG tablet Take 1 tablet (7.5 mg total) by mouth 2 (two) times daily. 60 tablet 0  . metFORMIN (GLUCOPHAGE) 1000 MG tablet Take 1 tablet (1,000 mg total) by mouth 2 (two) times daily with a meal. 60 tablet 0  . montelukast (SINGULAIR) 10 MG tablet Take 1 tablet (10 mg total) by  mouth at bedtime. For Asthma 30 tablet 0  . polyethylene glycol (MIRALAX / GLYCOLAX) packet Take 17 g by mouth daily. 30 each 0  . propranolol (INDERAL) 20 MG tablet Take 1 tablet (20 mg total) by mouth 2 (two) times daily. 60 tablet 0  . simvastatin (ZOCOR) 40 MG tablet Take 1 tablet (40 mg total) by mouth at bedtime. For high cholesterol 15 tablet 0    Results for orders placed or performed during the hospital encounter of 11/06/16 (from the past 48 hour(s))  Glucose, capillary     Status: Abnormal   Collection Time: 11/28/16 11:28 AM  Result Value Ref Range   Glucose-Capillary 213 (H) 65 - 99 mg/dL  Glucose, capillary     Status: None   Collection Time: 11/28/16  4:40 PM  Result Value Ref Range   Glucose-Capillary 69 65 - 99 mg/dL  Glucose, capillary     Status: Abnormal   Collection Time: 11/28/16  5:05 PM  Result Value Ref Range   Glucose-Capillary 108 (H) 65 - 99 mg/dL  Glucose, capillary     Status: Abnormal   Collection Time: 11/28/16  7:48 PM  Result Value Ref Range   Glucose-Capillary 302 (H) 65 - 99 mg/dL  Glucose, capillary     Status: Abnormal   Collection Time: 11/29/16  6:52 AM  Result  Value Ref Range   Glucose-Capillary 133 (H) 65 - 99 mg/dL  Glucose, capillary     Status: Abnormal   Collection Time: 11/29/16 11:25 AM  Result Value Ref Range   Glucose-Capillary 188 (H) 65 - 99 mg/dL  Glucose, capillary     Status: Abnormal   Collection Time: 11/29/16  4:22 PM  Result Value Ref Range   Glucose-Capillary 242 (H) 65 - 99 mg/dL  Glucose, capillary     Status: Abnormal   Collection Time: 11/29/16  8:44 PM  Result Value Ref Range   Glucose-Capillary 236 (H) 65 - 99 mg/dL  Glucose, capillary     Status: Abnormal   Collection Time: 11/30/16  6:44 AM  Result Value Ref Range   Glucose-Capillary 223 (H) 65 - 99 mg/dL   No results found.  Review of Systems  Constitutional: Negative.   HENT: Negative.   Eyes: Negative.   Respiratory: Negative.   Cardiovascular:  Negative.   Gastrointestinal: Negative.   Musculoskeletal: Negative.   Skin: Negative.   Neurological: Negative.   Psychiatric/Behavioral: Positive for memory loss. Negative for depression, hallucinations, substance abuse and suicidal ideas. The patient is nervous/anxious. The patient does not have insomnia.     Blood pressure 110/70, pulse (!) 59, temperature 98.1 F (36.7 C), temperature source Oral, resp. rate 16, height 5\' 8"  (1.727 m), weight 81.2 kg (179 lb), SpO2 99 %. Physical Exam  Nursing note and vitals reviewed. Constitutional: He appears well-developed and well-nourished.  HENT:  Head: Normocephalic and atraumatic.  Eyes: Conjunctivae are normal. Pupils are equal, round, and reactive to light.  Neck: Normal range of motion.  Cardiovascular: Regular rhythm and normal heart sounds.   Respiratory: Effort normal. No respiratory distress.  GI: Soft.  Musculoskeletal: Normal range of motion.  Neurological: He is alert.  Skin: Skin is warm and dry.  Psychiatric: Judgment normal. His affect is blunt. His speech is delayed. He is slowed. Cognition and memory are impaired. He expresses no suicidal ideation. He exhibits abnormal recent memory.     Assessment/Plan Patient continues to show a little bit of improvement. Ideally at probably like to continue through this week. Continue next scheduled treatment Wednesday.  Wednesday, MD 11/30/2016, 10:53 AM

## 2016-11-30 NOTE — Progress Notes (Signed)
[facility] MD Progress Note  11/30/2016 6:18 PM Tony Cunningham  MRN:  [id number]  Subjective:  Tony Cunningham is a 41 year old male with prior diagnosis of schizoaffective disorder. He received ECT to be continued next week if appropriate.  He was started on Clozapine and his dose was titrated to 225 mg. He is also on Luvox. Clozapine level was quite high with resulting sedation, because of the combination. Unfortunately in ERROR his Clozapine was discotoniued after Monday. I restarted 100 mg of Clozapine tonight. Actually, he is doing well without it, less sedated. Not knowing he was off Clozapine I took another level Thursday night.  11/08/16. The patient has been very isolative and withdrawn. Hygiene is poor and he is malodorous. He has not showered. He did go outside with the other patients and has been getting up for meals but not attending groups or interacting with staff or peers. He denies any current active or passive suicidal thoughts but does admit to some very vague paranoid thoughts that others are watching him. He is very limited and talking with this Probation officer and wanted to be "left alone to sleep". He denied any current somatic complaints. Vital signs are stable and he slept fairly well last night but is also sleeping partially during the daytime. So far, he is tolerating the Clozaril fairly well without any physical adverse side effects.  Follow-up for this 41 year old man with what appears to be psychotic depression or schizoaffective disorder. Patient remains in bed most of the time. Poorly interactive. Mood is depressed. Passive suicidal thoughts.  Follow-up for Thursday the 21st. Patient remains withdrawn very little activity. Blood sugars are also running much higher. Patient continues to endorse feeling depressed and having vague suicidal thoughts.  Follow-up for Friday the 22nd. Patient finally had ECT this morning bilateral treatment which went off without any difficulty or complication. I saw him  this afternoon and I think he actually was a little more interactive and verbal and energetic than before. This may be wishful thinking but it makes me optimistic. No specific complaints other than headache  Patient was seen today for follow-up. Patient has no new complaints. Continues to say his thoughts are racing but his affect is flat and withdrawn he is almost completely withdrawn from other people and stays in his room by himself. Suicidal thoughts passive no intention or plan. Still has psychotic symptoms. Blood sugars unfortunately continued to run quite high.  6/23 patient denies having any issues or concerns today. Says that he has been eating and sleeping well. Denies side effects from medications or any physical complaints. Denies suicidality or homicidality. He says that he might be hallucinating but he is not sure. He said that he had 1 ECT procedure and it went well.   Blood glucose this morning was 92. Patient is compliant with medications. No major events overnight per nursing.  6/24 patient did not sleep well last night. Other than that he denies having any issues or concerns. He denies suicidality, homicidality or auditory visual hallucinations. During examination he appears much brighter and more conversational. His thought processes are linear.  11/16/2016. Tony Cunningham does not like talking to me today. He is in his room he is in bed He does not open his eyes for me and is unable to keep a conversation he keeps telling me okay okay and is unwilling to answer any questions. He received just one ECT treatment last week. Since Dr. Weber Cooks is on vacation, ECT will resume next week.  He  accepts medications and seems to tolerate them well Clozaril level is over 700. This is probably from a combination of clozapine and Luvox. I will alert dose of clozapine to 200 mg  To avoid side effects. I will switch to Luvox to 1 nightly dose to improve sleep.  11/17/2016. Tony Cunningham is much more animated and  interactive today. He is in his room in bed bugs is able to hold a conversation and maintains good eye contact. He complains of stomach ache. He did have a bowel movement this morning. His mood is still depressed and he needs much encouragement to attend to his ADLs. Staff needs to strongly encourage showering. He has high hopes for ECT and will not stay in the hospital until next week to resume ECT with Dr. Weber Cooks Is back from vacation. Yesterday he is agreeable to the nighttime and clozapine dose decrease due to high clozapine level. This could have helped the patient stated more awake this morning. He slept almost 6 hours.  11/18/2016. Tony Cunningham seems more relaxed and talkative today. He is still very flat on his back but opens his eyes for me to we are having a small conversation. He has not been sleeping well. There are in spite of medication adjustments. He believes that trazodone works well for him. I will start trazodone tonight. She no longer complains of stomach pain. He does not participate in programming but takes medications with no problem. She met with the group home representative this week and was accepted to a group home with a bed open at the beginning of July. When asked returns from vacation, he will decide whether to continue ECT or whether the patient will be discharged.  11/19/2016. Tony Cunningham seems better today. This morning he has been up and walking around. He complains of "tension" in his head both mental and physical. He slept 3 hours only again in spite of multiple sedating medications given at night including Trazodone. I will switch him to Restoril tonight. It may need to be discontinued if her restarts ECT next week. No other somatic complaints. Sugers still elevated at times. I changed diet to ADA and discontinued Glucerna. Input from diabetes nurse coordinator and medicine consultant is greatly appreciated. He does not interact with staff/peers or participate in programing. Hygiene  is still questionable.  11/20/2016. Tony Cunningham is up and about today. He comes to my office for interview. He seems more relaxed today. Still very vague in his answers. Complains of back and neck pain but has been in bed on his back most of the week. His sugar was low today as he was switched to ADA diet and Glucerna was discontinued upon [facility] suggestion. He is looking forward to see Dr. Weber Cooks "who is the best". He corrects himself to tell me that I am not bad either.  Follow-up for June 30. Patient seen. Spoke with nursing. Patient continues to report feeling bad. Mood feels depressed. Denies acute suicidal thoughts but feels hopeless about the future. Feels paranoid and vaguely report psychosis. Patient continues to have poor self-care and poor interaction with others on the unit. Physically he seems to be stabilizing a little.  Follow-up Sunday, July 1. Patient seen. He looks a little better groomed today. He has been up out of his bed although he does not attend any groups. He is ambivalent about his mood today. Denies acute hallucinations or suicidal thinking. Still feels hopeless about discharge.  Patient seen for follow-up on Monday, July 2. He  had ECT treatment this morning which was complicated by some delirium and agitation during the recovery phase. Otherwise treatment went fine. Seen this afternoon the patient is still a little groggy from the medicines he received. No specific physical complaints however. Mood is still depressed and he is still slow and cognitively impaired. Denies acute suicidal intent. Sugars continue to be very labile. He is requesting a restart of his Glucerna but I am unsure whether that is appropriate.  [place] Tuesday, July 3. Patient has no new complaints. In fact today he seems to think he is doing a little bit better. His affect looked brighter and he was more conversant with me. He is taking some steps to improve his appearance.  Follow-up for Wednesday, July 4.  Patient seen. His only complaint today is wanting to have the dietary supplement Glucerna shake back. He thinks his mood is feeling better. He lets me know that he has been out of bed watching TV and being around people. Hallucinations are minimal. Denies suicidal thoughts. Admits that he is feeling a little more optimistic about going home with his family.  Follow-up for Thursday, July 5. Patient says he is feeling a little better than yesterday. Still feels anxious much of the time. He has made the effort to go to groups a little bit. Thought still are scattered and a little disorganized. Stays withdrawn most of the time. Denies however having active suicidal thoughts. Sugars continue to be labile but nothing terrible  Follow-up for Friday the sixth. Patient had ECT today which was tolerated well although he still has some agitation during recovery. Overall however his mood is improving. He is more interactive makes better eye contact and has managed to get up and be around other patients better. Denies any suicidal thoughts currently. Still feels confused and gets very nervous but denies he's having hallucinations.  11/28/2016- Patient continues to report being depressed. Has poor hygiene. Patient with strong malodor. He has not been attending any programming. Denies suicidal thoughts. Patient had 3 ECT treatments so far.  11/29/2016 Patient sleeping in his room. Continues to just lie in his bed, not attending to his hygiene despite his nurse coaxing him. His room has been cleaned and all sheets changed.   Follow-up for Monday, July 9. Patient had ECT treatment this morning. Bilateral treatment tolerated well recovered without difficulty. Seen this afternoon he is a little bit tired but says that overall he thinks he is getting better. Still stays pretty isolated. Hygiene fortunately has improved over the weekend.  Per nursing: Pt calm, cooperative and pleasant.  AM BS 133.  Speech is more clear and  logical, though remains hypoverbal.  Remains frequently isolative to room.  No behavioral issues.     Principal Problem: Schizoaffective disorder, depressive type ([place]) Diagnosis:   Patient Active Problem List   Diagnosis Date Noted  . Schizoaffective disorder, depressive type ([place]) [F25.1] 09/23/2016  . Polysubstance abuse [F19.10] 07/17/2016  . Tardive dyskinesia [G24.01] 07/16/2016  . Tobacco use disorder [F17.200] 07/07/2016  . Dyslipidemia [E78.5] 07/07/2016  . Asthma [J45.909] 07/07/2016  . HTN (hypertension) [I10] 07/06/2016  . Diabetes ([place]) [E11.9] 12/25/2010   Total Time spent with patient: 20 minutes    Past Medical History:  Past Medical History:  Diagnosis Date  . Anxiety   . Asthma   . Diabetes mellitus   . High blood pressure   . Sinus complaint    History reviewed. No pertinent surgical history.  Social History:  History  Alcohol Use No     History  Drug Use No    Social History   Social History  . Marital status: Single    Spouse name: [name]  . Number of children: [name]  . Years of education: [name]   Social History Main Topics  . Smoking status: Current Every Day Smoker    Packs/day: 0.50    Types: Cigarettes  . Smokeless tobacco: Never Used  . Alcohol use No  . Drug use: No  . Sexual activity: Not Currently   Other Topics Concern  . None   Social History Narrative  . None   Additional Social History:                         Sleep: Good  Appetite:  Good  Current Medications: Current Facility-Administered Medications  Medication Dose Route Frequency Provider Last Rate Last Dose  . acetaminophen (TYLENOL) tablet 650 mg  650 mg Oral Q6H PRN Brianah Hopson, [name], MD   650 mg at 11/19/16 1739  . alum & mag hydroxide-simeth (MAALOX/MYLANTA) 200-200-20 MG/5ML suspension 30 mL  30 mL Oral Q4H PRN Shira Bobst T, MD      . cloZAPine (CLOZARIL) tablet 100 mg  100 mg Oral QHS [name], [name], MD   100 mg at 11/29/16 2203  .  dextrose 5 %-0.45 % sodium chloride infusion   Intravenous Continuous Dagmar Adcox, [name], MD 10 mL/hr at 11/13/16 1009    . feeding supplement (GLUCERNA SHAKE) (GLUCERNA SHAKE) liquid 237 mL  237 mL Oral TID WC Duante Arocho T, MD   237 mL at 11/30/16 1700  . fluvoxaMINE (LUVOX) tablet 100 mg  100 mg Oral QHS [name], [name], MD   100 mg at 11/29/16 2203  . glycopyrrolate (ROBINUL) injection 0.2 mg  0.2 mg Intravenous Once Maryetta Shafer T, MD      . insulin aspart (novoLOG) injection 0-15 Units  0-15 Units Subcutaneous TID WC Alford Highland, MD   3 Units at 11/30/16 1633  . insulin aspart (novoLOG) injection 0-5 Units  0-5 Units Subcutaneous QHS Alford Highland, MD   5 Units at 11/29/16 2201  . insulin aspart (novoLOG) injection 10 Units  10 Units Subcutaneous TID WC Enedina Finner, MD   10 Units at 11/30/16 1634  . insulin glargine (LANTUS) injection 55 Units  55 Units Subcutaneous QHS Enedina Finner, MD   55 Units at 11/29/16 2203  . lisinopril (PRINIVIL,ZESTRIL) tablet 20 mg  20 mg Oral Daily Tameko Halder, [name], MD   20 mg at 11/30/16 0801  . magnesium hydroxide (MILK OF MAGNESIA) suspension 30 mL  30 mL Oral Daily PRN Michael Ventresca T, MD      . meloxicam (MOBIC) tablet 7.5 mg  7.5 mg Oral BID PC Latasha Puskas T, MD   7.5 mg at 11/30/16 1707  . metFORMIN (GLUCOPHAGE) tablet 1,000 mg  1,000 mg Oral BID WC Jerell Demery, [name], MD   1,000 mg at 11/30/16 1707  . montelukast (SINGULAIR) tablet 10 mg  10 mg Oral QHS Raquel Racey, [name], MD   10 mg at 11/29/16 2203  . nicotine (NICODERM CQ - dosed in mg/24 hr) patch 7 mg  7 mg Transdermal Daily Darliss Ridgel, MD   7 mg at 11/30/16 0801  . pantoprazole (PROTONIX) EC tablet 40 mg  40 mg Oral Daily [name], [name], MD   40 mg at 11/30/16 0801  . polyethylene glycol (MIRALAX / GLYCOLAX) packet 17  g  17 g Oral Daily Deshawna Mcneece, Madie Reno, MD   17 g at 11/30/16 1230  . promethazine (PHENERGAN) tablet 12.5 mg  12.5 mg Oral Q6H PRN Shaylea Ucci T, MD      . propranolol  (INDERAL) tablet 20 mg  20 mg Oral BID Vira Chaplin, Madie Reno, MD   20 mg at 11/30/16 1706  . simvastatin (ZOCOR) tablet 40 mg  40 mg Oral q1800 Malessa Zartman, Madie Reno, MD   40 mg at 11/30/16 1707  . temazepam (RESTORIL) capsule 15 mg  15 mg Oral QHS [name], [name], MD   15 mg at 11/29/16 2203    Lab Results:  Results for orders placed or performed during the hospital encounter of 11/06/16 (from the past 48 hour(s))  Glucose, capillary     Status: Abnormal   Collection Time: 11/28/16  7:48 PM  Result Value Ref Range   Glucose-Capillary 302 (H) 65 - 99 mg/dL  Glucose, capillary     Status: Abnormal   Collection Time: 11/29/16  6:52 AM  Result Value Ref Range   Glucose-Capillary 133 (H) 65 - 99 mg/dL  Glucose, capillary     Status: Abnormal   Collection Time: 11/29/16 11:25 AM  Result Value Ref Range   Glucose-Capillary 188 (H) 65 - 99 mg/dL  Glucose, capillary     Status: Abnormal   Collection Time: 11/29/16  4:22 PM  Result Value Ref Range   Glucose-Capillary 242 (H) 65 - 99 mg/dL  Glucose, capillary     Status: Abnormal   Collection Time: 11/29/16  8:44 PM  Result Value Ref Range   Glucose-Capillary 236 (H) 65 - 99 mg/dL  Glucose, capillary     Status: Abnormal   Collection Time: 11/30/16  6:44 AM  Result Value Ref Range   Glucose-Capillary 223 (H) 65 - 99 mg/dL  Glucose, capillary     Status: Abnormal   Collection Time: 11/30/16 11:23 AM  Result Value Ref Range   Glucose-Capillary 140 (H) 65 - 99 mg/dL  Glucose, capillary     Status: Abnormal   Collection Time: 11/30/16 12:24 PM  Result Value Ref Range   Glucose-Capillary 152 (H) 65 - 99 mg/dL  Glucose, capillary     Status: Abnormal   Collection Time: 11/30/16  4:28 PM  Result Value Ref Range   Glucose-Capillary 159 (H) 65 - 99 mg/dL    Blood Alcohol level:  Lab Results  Component Value Date   ETH <5 10/21/2016   ETH <5 63/14/9702    Metabolic Disorder Labs: Lab Results  Component Value Date   HGBA1C 10.3 (H)  11/09/2016   MPG 249 11/09/2016   MPG 275 10/09/2016   Lab Results  Component Value Date   PROLACTIN 24.5 (H) 09/24/2016   PROLACTIN 3.4 (L) 07/07/2016   Lab Results  Component Value Date   CHOL 92 11/09/2016   TRIG 54 11/09/2016   HDL 39 (L) 11/09/2016   CHOLHDL 2.4 11/09/2016   VLDL 11 11/09/2016   LDLCALC 42 11/09/2016   LDLCALC 72 10/09/2016    Physical Findings: AIMS: Facial and Oral Movements Muscles of Facial Expression: Minimal Lips and Perioral Area: Minimal Jaw: None, normal Tongue: None, normal,Extremity Movements Upper (arms, wrists, hands, fingers): None, normal Lower (legs, knees, ankles, toes): Mild, Trunk Movements Neck, shoulders, hips: None, normal, Overall Severity Severity of abnormal movements (highest score from questions above): Mild Incapacitation due to abnormal movements: None, normal Patient's awareness of abnormal movements (rate only patient's report): No Awareness,  Dental Status Current problems with teeth and/or dentures?: No Does patient usually wear dentures?: No   Musculoskeletal: Strength & Muscle Tone: within normal limits Gait & Station: normal Patient leans: [name]  Psychiatric Specialty Exam: Physical Exam  Nursing note and vitals reviewed. Constitutional: He appears well-developed and well-nourished.  HENT:  Head: Normocephalic and atraumatic.  Eyes: Conjunctivae are normal. Pupils are equal, round, and reactive to light.  Neck: Normal range of motion.  Cardiovascular: Regular rhythm and normal heart sounds.   Respiratory: Effort normal.  GI: Soft.  Musculoskeletal: Normal range of motion.  Neurological: He is alert.  Skin: Skin is warm and dry.  Psychiatric: His affect is blunt. His speech is delayed. He is slowed. Thought content is not paranoid and not delusional. Cognition and memory are normal. He expresses impulsivity.    Review of Systems  Constitutional: Negative.   HENT: Negative.   Eyes: Negative.    Cardiovascular: Negative.   Gastrointestinal: Negative.   Genitourinary: Negative.   Musculoskeletal: Negative.   Skin: Negative.   Neurological: Negative.   Endo/Heme/Allergies: Negative.   Psychiatric/Behavioral: Positive for depression. Negative for hallucinations.    Blood pressure 118/78, pulse 77, temperature 98.7 F (37.1 C), temperature source Oral, resp. rate 16, height '5\' 8"'$  (1.727 m), weight 81.2 kg (179 lb), SpO2 97 %.Body mass index is 27.22 kg/m.  General Appearance: Disheveled  Eye Contact:  Poor  Speech:  Slow  Volume:  Decreased  Mood:  Dysphoric  Affect:  Blunt  Thought Process:  Linear  Orientation:  Full (Time, Place, and Person)  Thought Content:  Paranoid Ideation  Suicidal Thoughts:  No  Homicidal Thoughts:  No  Memory:  Immediate;   Fair Recent;   Fair Remote;   Fair  Judgement:  Impaired  Insight:  Lacking  Psychomotor Activity:  Decreased  Concentration:  Concentration: Fair and Attention Span: Fair  Recall:  [employer] of Knowledge:  Fair  Language:  Good  Akathisia:  No  Handed:  Right  AIMS (if indicated):     Assets:  Housing Physical Health  ADL's:  Intact  Cognition:  WNL  Sleep:  Number of Hours: 7.15     Treatment Plan Summary:  Tony Cunningham 41 year old male with prior diagnosis of schizoaffective disorder who presented to the emergency room with vague suicidal thoughts as well as paranoid thoughts. He does. Be responding to internal stimuli and has endorsed some auditory and visual hallucinations. He will be admitted to inpatient psychiatry for medication management, safety and stabilization.  1. Schizoaffective disorder, depressive type.   Continue Clozapine '100mg'$  at bedtime.  2. ECT. Patient had ECT on Friday,11/13/2016 for the first time in this hospitalization.  Patient had 3 ECT treatments so far  3. Metabolic syndrome monitoring. Lipid panel is normal, hemoglobin A1c 103.  4. EKG. Normal sinus rhythm, QTc 417.  5.  Questionable history of polysubstance abuse: The patient denies any history of any polysubstance use but records indicate that there are may have been a history of polysubstance use. He was advised to abstain from alcohol and all illicit drugs as it may worsen anxiety mood symptoms as well as psychosis. The patient is not interested in any substance abuse treatment.  6. Nicotine use disorder. Nicotine patch is available.   7. Diabetes. He is on Metformin, Novolog 10 units with meals and Lantus 55 units, along with ADA diet and blood sugar monitoring. Medicine input is greatly appreciated. Glucerna is discontinued.  8. Hypertension: Vital signs are  stable. We'll continue lisinopril 20 mg daily.  9. Seasonal allergies: Continue Singulair 10 mg nightly  10. Anxiety/restlessness. He is on Propranolol.   11. Insomnia. We discontinued Trazodone and started Restoril. Slept 7.5 hours.  12. Back pain. We will give Robaxin.  13. Disposition: The patient will return to a group home. He will follow up with [facility] in [facility]. Consider ACT team.  I think the consensus is that the patient is improving with medication and ECT although he still remains very isolated. We're concerned about how well he is going to do outside of the hospital particularly depending on what kind of environment he has. I'm still hoping to continue ECT for another treatment or 2 since this is only #4. Continue to encourage patient to be as active as possible.   Alethia Berthold, MD 11/30/2016, 6:18 PM

## 2016-11-30 NOTE — Transfer of Care (Signed)
Immediate Anesthesia Transfer of Care Note  Patient: Tony Cunningham  Procedure(s) Performed: * No procedures listed *  Patient Location: PACU  Anesthesia Type:General  Level of Consciousness: sedated  Airway & Oxygen Therapy: Patient Spontanous Breathing and Patient connected to face mask oxygen  Post-op Assessment: Report given to RN and Post -op Vital signs reviewed and stable  Post vital signs: Reviewed and stable  Last Vitals:  Vitals:   11/30/16 0906 11/30/16 1115  BP: 110/70 126/81  Pulse: (!) 59 86  Resp: 16 20  Temp: 36.7 C 36.7 C    Complications: No apparent anesthesia complications

## 2016-12-01 ENCOUNTER — Other Ambulatory Visit: Payer: Self-pay | Admitting: Psychiatry

## 2016-12-01 LAB — CBC WITH DIFFERENTIAL/PLATELET
BASOS PCT: 1 %
Basophils Absolute: 0.1 10*3/uL (ref 0–0.1)
Eosinophils Absolute: 0.4 10*3/uL (ref 0–0.7)
Eosinophils Relative: 5 %
HCT: 38.2 % — ABNORMAL LOW (ref 40.0–52.0)
HEMOGLOBIN: 13.7 g/dL (ref 13.0–18.0)
LYMPHS ABS: 2.4 10*3/uL (ref 1.0–3.6)
Lymphocytes Relative: 35 %
MCH: 31.5 pg (ref 26.0–34.0)
MCHC: 35.8 g/dL (ref 32.0–36.0)
MCV: 88 fL (ref 80.0–100.0)
MONOS PCT: 6 %
Monocytes Absolute: 0.4 10*3/uL (ref 0.2–1.0)
NEUTROS ABS: 3.6 10*3/uL (ref 1.4–6.5)
NEUTROS PCT: 53 %
Platelets: 180 10*3/uL (ref 150–440)
RBC: 4.34 MIL/uL — ABNORMAL LOW (ref 4.40–5.90)
RDW: 12.7 % (ref 11.5–14.5)
WBC: 6.9 10*3/uL (ref 3.8–10.6)

## 2016-12-01 LAB — GLUCOSE, CAPILLARY
GLUCOSE-CAPILLARY: 113 mg/dL — AB (ref 65–99)
Glucose-Capillary: 175 mg/dL — ABNORMAL HIGH (ref 65–99)
Glucose-Capillary: 309 mg/dL — ABNORMAL HIGH (ref 65–99)
Glucose-Capillary: 206 mg/dL — ABNORMAL HIGH (ref 65–99)

## 2016-12-01 NOTE — [facility] Group Notes (Addendum)
[facility] LCSW Group Therapy Note  Date/Time: 12/01/2016, 3:00pm   Type of Therapy/Topic:  Group Therapy:  Feelings about Diagnosis  Participation Level:  Minimal   Mood: Verbalized feeling "okay"   Description of Group:    This group will allow patients to explore their thoughts and feelings about diagnoses they have received. Patients will be guided to explore their level of understanding and acceptance of these diagnoses. Facilitator will encourage patients to process their thoughts and feelings about the reactions of others to their diagnosis, and will guide patients in identifying ways to discuss their diagnosis with significant others in their lives. This group will be process-oriented, with patients participating in exploration of their own experiences as well as giving and receiving support and challenge from other group members.   Therapeutic Goals: 1. Patient will demonstrate understanding of diagnosis as evidence by identifying two or more symptoms of the disorder:  2. Patient will be able to express two feelings regarding the diagnosis 3. Patient will demonstrate ability to communicate their needs through discussion and/or role plays  Summary of Patient Progress:  Pt attended group, which is a big step in itself as he is typically isolated to his room.  He did not participate much in group discussion.  He continues to have poor hygiene as evidenced by malodor of urine.  CSW talked with Pt regarding discharge plan. He verbalizes being excited to go home with his mom at discharge.    Therapeutic Modalities:   Cognitive Behavioral Therapy Brief Therapy Feelings Identification    Glennon Mac, LCSW 12/01/2016, 4:08 PM

## 2016-12-01 NOTE — Progress Notes (Signed)
[name] MD Progress Note  12/01/2016 5:28 PM Odas Ozer  MRN:  [id number]  Subjective:  Mr. Meyer is a 41 year old male with prior diagnosis of schizoaffective disorder. He received ECT to be continued next week if appropriate.  He was started on Clozapine and his dose was titrated to 225 mg. He is also on Luvox. Clozapine level was quite high with resulting sedation, because of the combination. Unfortunately in ERROR his Clozapine was discotoniued after Monday. I restarted 100 mg of Clozapine tonight. Actually, he is doing well without it, less sedated. Not knowing he was off Clozapine I took another level Thursday night.  11/08/16. The patient has been very isolative and withdrawn. Hygiene is poor and he is malodorous. He has not showered. He did go outside with the other patients and has been getting up for meals but not attending groups or interacting with staff or peers. He denies any current active or passive suicidal thoughts but does admit to some very vague paranoid thoughts that others are watching him. He is very limited and talking with this Probation officer and wanted to be "left alone to sleep". He denied any current somatic complaints. Vital signs are stable and he slept fairly well last night but is also sleeping partially during the daytime. So far, he is tolerating the Clozaril fairly well without any physical adverse side effects.  Follow-up for this 41 year old man with what appears to be psychotic depression or schizoaffective disorder. Patient remains in bed most of the time. Poorly interactive. Mood is depressed. Passive suicidal thoughts.  Follow-up for Thursday the 21st. Patient remains withdrawn very little activity. Blood sugars are also running much higher. Patient continues to endorse feeling depressed and having vague suicidal thoughts.  Follow-up for Friday the 22nd. Patient finally had ECT this morning bilateral treatment which went off without any difficulty or complication. I saw him  this afternoon and I think he actually was a little more interactive and verbal and energetic than before. This may be wishful thinking but it makes me optimistic. No specific complaints other than headache  Patient was seen today for follow-up. Patient has no new complaints. Continues to say his thoughts are racing but his affect is flat and withdrawn he is almost completely withdrawn from other people and stays in his room by himself. Suicidal thoughts passive no intention or plan. Still has psychotic symptoms. Blood sugars unfortunately continued to run quite high.  6/23 patient denies having any issues or concerns today. Says that he has been eating and sleeping well. Denies side effects from medications or any physical complaints. Denies suicidality or homicidality. He says that he might be hallucinating but he is not sure. He said that he had 1 ECT procedure and it went well.   Blood glucose this morning was 92. Patient is compliant with medications. No major events overnight per nursing.  6/24 patient did not sleep well last night. Other than that he denies having any issues or concerns. He denies suicidality, homicidality or auditory visual hallucinations. During examination he appears much brighter and more conversational. His thought processes are linear.  11/16/2016. Mr. Fielden does not like talking to me today. He is in his room he is in bed He does not open his eyes for me and is unable to keep a conversation he keeps telling me okay okay and is unwilling to answer any questions. He received just one ECT treatment last week. Since Dr. Weber Cooks is on vacation, ECT will resume next week.  He  accepts medications and seems to tolerate them well Clozaril level is over 700. This is probably from a combination of clozapine and Luvox. I will alert dose of clozapine to 200 mg  To avoid side effects. I will switch to Luvox to 1 nightly dose to improve sleep.  11/17/2016. [name] is much more animated and  interactive today. He is in his room in bed bugs is able to hold a conversation and maintains good eye contact. He complains of stomach ache. He did have a bowel movement this morning. His mood is still depressed and he needs much encouragement to attend to his ADLs. Staff needs to strongly encourage showering. He has high hopes for ECT and will not stay in the hospital until next week to resume ECT with Dr. Weber Cooks Is back from vacation. Yesterday he is agreeable to the nighttime and clozapine dose decrease due to high clozapine level. This could have helped the patient stated more awake this morning. He slept almost 6 hours.  11/18/2016. [name] seems more relaxed and talkative today. He is still very flat on his back but opens his eyes for me to we are having a small conversation. He has not been sleeping well. There are in spite of medication adjustments. He believes that trazodone works well for him. I will start trazodone tonight. She no longer complains of stomach pain. He does not participate in programming but takes medications with no problem. She met with the group home representative this week and was accepted to a group home with a bed open at the beginning of July. When asked returns from vacation, he will decide whether to continue ECT or whether the patient will be discharged.  11/19/2016. [name] seems better today. This morning he has been up and walking around. He complains of "tension" in his head both mental and physical. He slept 3 hours only again in spite of multiple sedating medications given at night including Trazodone. I will switch him to Restoril tonight. It may need to be discontinued if her restarts ECT next week. No other somatic complaints. Sugers still elevated at times. I changed diet to ADA and discontinued Glucerna. Input from diabetes nurse coordinator and medicine consultant is greatly appreciated. He does not interact with staff/peers or participate in programing. Hygiene  is still questionable.  11/20/2016. [name] is up and about today. He comes to my office for interview. He seems more relaxed today. Still very vague in his answers. Complains of back and neck pain but has been in bed on his back most of the week. His sugar was low today as he was switched to ADA diet and Glucerna was discontinued upon [facility] suggestion. He is looking forward to see Dr. Weber Cooks "who is the best". He corrects himself to tell me that I am not bad either.  Follow-up for June 30. Patient seen. Spoke with nursing. Patient continues to report feeling bad. Mood feels depressed. Denies acute suicidal thoughts but feels hopeless about the future. Feels paranoid and vaguely report psychosis. Patient continues to have poor self-care and poor interaction with others on the unit. Physically he seems to be stabilizing a little.  Follow-up Sunday, July 1. Patient seen. He looks a little better groomed today. He has been up out of his bed although he does not attend any groups. He is ambivalent about his mood today. Denies acute hallucinations or suicidal thinking. Still feels hopeless about discharge.  Patient seen for follow-up on Monday, July 2. He  had ECT treatment this morning which was complicated by some delirium and agitation during the recovery phase. Otherwise treatment went fine. Seen this afternoon the patient is still a little groggy from the medicines he received. No specific physical complaints however. Mood is still depressed and he is still slow and cognitively impaired. Denies acute suicidal intent. Sugars continue to be very labile. He is requesting a restart of his Glucerna but I am unsure whether that is appropriate.  [place] Tuesday, July 3. Patient has no new complaints. In fact today he seems to think he is doing a little bit better. His affect looked brighter and he was more conversant with me. He is taking some steps to improve his appearance.  Follow-up for Wednesday, July 4.  Patient seen. His only complaint today is wanting to have the dietary supplement Glucerna shake back. He thinks his mood is feeling better. He lets me know that he has been out of bed watching TV and being around people. Hallucinations are minimal. Denies suicidal thoughts. Admits that he is feeling a little more optimistic about going home with his family.  Follow-up for Thursday, July 5. Patient says he is feeling a little better than yesterday. Still feels anxious much of the time. He has made the effort to go to groups a little bit. Thought still are scattered and a little disorganized. Stays withdrawn most of the time. Denies however having active suicidal thoughts. Sugars continue to be labile but nothing terrible  Follow-up for Friday the sixth. Patient had ECT today which was tolerated well although he still has some agitation during recovery. Overall however his mood is improving. He is more interactive makes better eye contact and has managed to get up and be around other patients better. Denies any suicidal thoughts currently. Still feels confused and gets very nervous but denies he's having hallucinations.  11/28/2016- Patient continues to report being depressed. Has poor hygiene. Patient with strong malodor. He has not been attending any programming. Denies suicidal thoughts. Patient had 3 ECT treatments so far.  11/29/2016 Patient sleeping in his room. Continues to just lie in his bed, not attending to his hygiene despite his nurse coaxing him. His room has been cleaned and all sheets changed.   Follow-up for Monday, July 9. Patient had ECT treatment this morning. Bilateral treatment tolerated well recovered without difficulty. Seen this afternoon he is a little bit tired but says that overall he thinks he is getting better. Still stays pretty isolated. Hygiene fortunately has improved over the weekend.  Follow-up for Tuesday, July 10. Patient seen this afternoon. Unfortunately he is still  in bed. It is rare for me to ever catch him out of bed or interacting. He claims that he has gone to some groups however. He says his mood is feeling better and he denies any hallucinations. He does admit to still feeling anxious and worried about how he is going to function when he goes home to stay with his mother. Blood sugars are actually looking a little bit more stable thank you very much to the hospitalists.  Per nursing: Pt calm, cooperative and pleasant.  AM BS 133.  Speech is more clear and logical, though remains hypoverbal.  Remains frequently isolative to room.  No behavioral issues.     Principal Problem: Schizoaffective disorder, depressive type ([place]) Diagnosis:   Patient Active Problem List   Diagnosis Date Noted  . Schizoaffective disorder, depressive type ([place]) [F25.1] 09/23/2016  . Polysubstance abuse [F19.10] 07/17/2016  .  Tardive dyskinesia [G24.01] 07/16/2016  . Tobacco use disorder [F17.200] 07/07/2016  . Dyslipidemia [E78.5] 07/07/2016  . Asthma [J45.909] 07/07/2016  . HTN (hypertension) [I10] 07/06/2016  . Diabetes (HCC) [E11.9] 12/25/2010   Total Time spent with patient: 20 minutes    Past Medical History:  Past Medical History:  Diagnosis Date  . Anxiety   . Asthma   . Diabetes mellitus   . High blood pressure   . Sinus complaint    History reviewed. No pertinent surgical history.  Social History:  History  Alcohol Use No     History  Drug Use No    Social History   Social History  . Marital status: Single    Spouse name: [name]  . Number of children: [name]  . Years of education: [name]   Social History Main Topics  . Smoking status: Current Every Day Smoker    Packs/day: 0.50    Types: Cigarettes  . Smokeless tobacco: Never Used  . Alcohol use No  . Drug use: No  . Sexual activity: Not Currently   Other Topics Concern  . None   Social History Narrative  . None   Additional Social History:                         Sleep:  Good  Appetite:  Good  Current Medications: Current Facility-Administered Medications  Medication Dose Route Frequency Provider Last Rate Last Dose  . acetaminophen (TYLENOL) tablet 650 mg  650 mg Oral Q6H PRN [place] Gentle, [name], MD   650 mg at 11/19/16 1739  . alum & mag hydroxide-simeth (MAALOX/MYLANTA) 200-200-20 MG/5ML suspension 30 mL  30 mL Oral Q4H PRN Ozell Juhasz T, MD      . cloZAPine (CLOZARIL) tablet 100 mg  100 mg Oral QHS [name], [name], MD   100 mg at 11/30/16 2127  . dextrose 5 %-0.45 % sodium chloride infusion   Intravenous Continuous [place], [name], MD 10 mL/hr at 11/13/16 1009    . feeding supplement (GLUCERNA SHAKE) (GLUCERNA SHAKE) liquid 237 mL  237 mL Oral TID WC Keliah Harned T, MD   237 mL at 12/01/16 1130  . fluvoxaMINE (LUVOX) tablet 100 mg  100 mg Oral QHS [name], [name], MD   100 mg at 11/30/16 2127  . glycopyrrolate (ROBINUL) injection 0.2 mg  0.2 mg Intravenous Once Devon Kingdon T, MD      . insulin aspart (novoLOG) injection 0-15 Units  0-15 Units Subcutaneous TID WC Alford Highland, MD   11 Units at 12/01/16 1634  . insulin aspart (novoLOG) injection 0-5 Units  0-5 Units Subcutaneous QHS Alford Highland, MD   5 Units at 11/29/16 2201  . insulin aspart (novoLOG) injection 10 Units  10 Units Subcutaneous TID WC Enedina Finner, MD   10 Units at 12/01/16 1633  . insulin glargine (LANTUS) injection 55 Units  55 Units Subcutaneous QHS Enedina Finner, MD   55 Units at 11/30/16 2127  . lisinopril (PRINIVIL,ZESTRIL) tablet 20 mg  20 mg Oral Daily Puanani Gene, [name], MD   20 mg at 12/01/16 0856  . magnesium hydroxide (MILK OF MAGNESIA) suspension 30 mL  30 mL Oral Daily PRN Ski Polich T, MD      . meloxicam (MOBIC) tablet 7.5 mg  7.5 mg Oral BID PC Arav Bannister T, MD   7.5 mg at 12/01/16 0857  . metFORMIN (GLUCOPHAGE) tablet 1,000 mg  1,000 mg Oral BID WC Chelcy Bolda T,  MD   1,000 mg at 12/01/16 0856  . montelukast (SINGULAIR) tablet 10 mg  10 mg Oral QHS  Tinaya Ceballos T, MD   10 mg at 11/30/16 2127  . nicotine (NICODERM CQ - dosed in mg/24 hr) patch 7 mg  7 mg Transdermal Daily Chauncey Mann, MD   7 mg at 12/01/16 0857  . pantoprazole (PROTONIX) EC tablet 40 mg  40 mg Oral Daily [name], [name], MD   40 mg at 12/01/16 0856  . polyethylene glycol (MIRALAX / GLYCOLAX) packet 17 g  17 g Oral Daily Zniyah Midkiff T, MD   17 g at 11/30/16 1230  . promethazine (PHENERGAN) tablet 12.5 mg  12.5 mg Oral Q6H PRN Donnielle Addison T, MD      . propranolol (INDERAL) tablet 20 mg  20 mg Oral BID Perrie Ragin, Madie Reno, MD   20 mg at 12/01/16 0857  . simvastatin (ZOCOR) tablet 40 mg  40 mg Oral q1800 Javar Eshbach, Madie Reno, MD   40 mg at 11/30/16 1707  . temazepam (RESTORIL) capsule 15 mg  15 mg Oral QHS [name], [name], MD   15 mg at 11/30/16 2127    Lab Results:  Results for orders placed or performed during the hospital encounter of 11/06/16 (from the past 48 hour(s))  Glucose, capillary     Status: Abnormal   Collection Time: 11/29/16  8:44 PM  Result Value Ref Range   Glucose-Capillary 236 (H) 65 - 99 mg/dL  Glucose, capillary     Status: Abnormal   Collection Time: 11/30/16  6:44 AM  Result Value Ref Range   Glucose-Capillary 223 (H) 65 - 99 mg/dL  Glucose, capillary     Status: Abnormal   Collection Time: 11/30/16 11:23 AM  Result Value Ref Range   Glucose-Capillary 140 (H) 65 - 99 mg/dL  Glucose, capillary     Status: Abnormal   Collection Time: 11/30/16 12:24 PM  Result Value Ref Range   Glucose-Capillary 152 (H) 65 - 99 mg/dL  Glucose, capillary     Status: Abnormal   Collection Time: 11/30/16  4:28 PM  Result Value Ref Range   Glucose-Capillary 159 (H) 65 - 99 mg/dL  Glucose, capillary     Status: Abnormal   Collection Time: 11/30/16  8:14 PM  Result Value Ref Range   Glucose-Capillary 145 (H) 65 - 99 mg/dL   Comment 1 Notify RN   Glucose, capillary     Status: Abnormal   Collection Time: 12/01/16  6:47 AM  Result Value Ref Range    Glucose-Capillary 175 (H) 65 - 99 mg/dL  CBC with Differential/Platelet     Status: Abnormal   Collection Time: 12/01/16  7:21 AM  Result Value Ref Range   WBC 6.9 3.8 - 10.6 K/uL   RBC 4.34 (L) 4.40 - 5.90 MIL/uL   Hemoglobin 13.7 13.0 - 18.0 g/dL   HCT 38.2 (L) 40.0 - 52.0 %   MCV 88.0 80.0 - 100.0 fL   MCH 31.5 26.0 - 34.0 pg   MCHC 35.8 32.0 - 36.0 g/dL   RDW 12.7 11.5 - 14.5 %   Platelets 180 150 - 440 K/uL   Neutrophils Relative % 53 %   Neutro Abs 3.6 1.4 - 6.5 K/uL   Lymphocytes Relative 35 %   Lymphs Abs 2.4 1.0 - 3.6 K/uL   Monocytes Relative 6 %   Monocytes Absolute 0.4 0.2 - 1.0 K/uL   Eosinophils Relative 5 %   Eosinophils Absolute  0.4 0 - 0.7 K/uL   Basophils Relative 1 %   Basophils Absolute 0.1 0 - 0.1 K/uL  Glucose, capillary     Status: Abnormal   Collection Time: 12/01/16 11:24 AM  Result Value Ref Range   Glucose-Capillary 113 (H) 65 - 99 mg/dL  Glucose, capillary     Status: Abnormal   Collection Time: 12/01/16  4:31 PM  Result Value Ref Range   Glucose-Capillary 309 (H) 65 - 99 mg/dL    Blood Alcohol level:  Lab Results  Component Value Date   ETH <5 10/21/2016   ETH <5 46/96/2952    Metabolic Disorder Labs: Lab Results  Component Value Date   HGBA1C 10.3 (H) 11/09/2016   MPG 249 11/09/2016   MPG 275 10/09/2016   Lab Results  Component Value Date   PROLACTIN 24.5 (H) 09/24/2016   PROLACTIN 3.4 (L) 07/07/2016   Lab Results  Component Value Date   CHOL 92 11/09/2016   TRIG 54 11/09/2016   HDL 39 (L) 11/09/2016   CHOLHDL 2.4 11/09/2016   VLDL 11 11/09/2016   LDLCALC 42 11/09/2016   LDLCALC 72 10/09/2016    Physical Findings: AIMS: Facial and Oral Movements Muscles of Facial Expression: Minimal Lips and Perioral Area: Minimal Jaw: None, normal Tongue: None, normal,Extremity Movements Upper (arms, wrists, hands, fingers): None, normal Lower (legs, knees, ankles, toes): Mild, Trunk Movements Neck, shoulders, hips: None, normal,  Overall Severity Severity of abnormal movements (highest score from questions above): Mild Incapacitation due to abnormal movements: None, normal Patient's awareness of abnormal movements (rate only patient's report): No Awareness, Dental Status Current problems with teeth and/or dentures?: No Does patient usually wear dentures?: No   Musculoskeletal: Strength & Muscle Tone: within normal limits Gait & Station: normal Patient leans: [name]  Psychiatric Specialty Exam: Physical Exam  Nursing note and vitals reviewed. Constitutional: He appears well-developed and well-nourished.  HENT:  Head: Normocephalic and atraumatic.  Eyes: Conjunctivae are normal. Pupils are equal, round, and reactive to light.  Neck: Normal range of motion.  Cardiovascular: Regular rhythm and normal heart sounds.   Respiratory: Effort normal.  GI: Soft.  Musculoskeletal: Normal range of motion.  Neurological: He is alert.  Skin: Skin is warm and dry.  Psychiatric: His affect is blunt. His speech is delayed. He is slowed. Thought content is not paranoid and not delusional. Cognition and memory are normal. He expresses impulsivity.    Review of Systems  Constitutional: Negative.   HENT: Negative.   Eyes: Negative.   Cardiovascular: Negative.   Gastrointestinal: Negative.   Genitourinary: Negative.   Musculoskeletal: Negative.   Skin: Negative.   Neurological: Negative.   Endo/Heme/Allergies: Negative.   Psychiatric/Behavioral: Positive for depression. Negative for hallucinations.    Blood pressure 99/65, pulse 80, temperature 98.2 F (36.8 C), temperature source Oral, resp. rate 18, height '5\' 8"'$  (1.727 m), weight 81.2 kg (179 lb), SpO2 98 %.Body mass index is 27.22 kg/m.  General Appearance: Disheveled  Eye Contact:  Poor  Speech:  Slow  Volume:  Decreased  Mood:  Dysphoric  Affect:  Blunt  Thought Process:  Linear  Orientation:  Full (Time, Place, and Person)  Thought Content:  Paranoid Ideation   Suicidal Thoughts:  No  Homicidal Thoughts:  No  Memory:  Immediate;   Fair Recent;   Fair Remote;   Fair  Judgement:  Impaired  Insight:  Lacking  Psychomotor Activity:  Decreased  Concentration:  Concentration: Fair and Attention Span: Fair  Recall:  Fair  Fund of Knowledge:  Fair  Language:  Good  Akathisia:  No  Handed:  Right  AIMS (if indicated):     Assets:  Housing Physical Health  ADL's:  Intact  Cognition:  WNL  Sleep:  Number of Hours: 7     Treatment Plan Summary:  Mr. Beeney 41 year old male with prior diagnosis of schizoaffective disorder who presented to the emergency room with vague suicidal thoughts as well as paranoid thoughts. He does. Be responding to internal stimuli and has endorsed some auditory and visual hallucinations. He will be admitted to inpatient psychiatry for medication management, safety and stabilization.  1. Schizoaffective disorder, depressive type.   Continue Clozapine '100mg'$  at bedtime.  2. ECT. Patient had ECT on Friday,11/13/2016 for the first time in this hospitalization.  Patient had 3 ECT treatments so far  3. Metabolic syndrome monitoring. Lipid panel is normal, hemoglobin A1c 103.  4. EKG. Normal sinus rhythm, QTc 417.  5. Questionable history of polysubstance abuse: The patient denies any history of any polysubstance use but records indicate that there are may have been a history of polysubstance use. He was advised to abstain from alcohol and all illicit drugs as it may worsen anxiety mood symptoms as well as psychosis. The patient is not interested in any substance abuse treatment.  6. Nicotine use disorder. Nicotine patch is available.   7. Diabetes. He is on Metformin, Novolog 10 units with meals and Lantus 55 units, along with ADA diet and blood sugar monitoring. Medicine input is greatly appreciated. Glucerna is discontinued.  8. Hypertension: Vital signs are stable. We'll continue lisinopril 20 mg daily.  9.  Seasonal allergies: Continue Singulair 10 mg nightly  10. Anxiety/restlessness. He is on Propranolol.   11. Insomnia. We discontinued Trazodone and started Restoril. Slept 7.5 hours.  12. Back pain. We will give Robaxin.  13. Disposition: The patient will return to a group home. He will follow up with [facility] in [facility]. Consider ACT team.  I do think the ECT is helpful and I'm going to recommend that we continue. He is on the schedule for tomorrow. If we can confirm that his family would be able to bring him back for treatment I would be willing to consider discharge before the end of the week but otherwise we may have him continue to get an index course. No change to medicine today. Patient's only specific request was to have the particular kind of nutrition supplement that he prefers.   Alethia Berthold, MD 12/01/2016, 5:28 PM

## 2016-12-01 NOTE — Progress Notes (Signed)
Recreation Therapy Notes  Date: 07.10.18 Time: 9:30 am Location: Craft Room  Group Topic: Goal Setting  Goal Area(s) Addresses:  Patient will write at least one goal. Patient will write at least one obstacle.  Behavioral Response: Did not attend  Intervention: Recovery Goal Chart  Activity: Patients were instructed to make a Recovery Goal Chart including their goals, obstacles, the date they started working on their goals, and the date they achieved their goals.  Education: LRT educated patients on healthy ways to celebrate reaching their goals.  Education Outcome: Patient did not attend group.   Clinical Observations/Feedback: Patient did not attend group.  Jacquelynn Cree, LRT/CTRS 12/01/2016 10:04 AM

## 2016-12-01 NOTE — Progress Notes (Signed)
MEDICATION RELATED CONSULT NOTE - FOLLOW UP   Pharmacy Consult for Clozapine Monitoring Indication: Schizophrenia  No Known Allergies  Patient Measurements: Height: 5\' 8"  (172.7 cm) Weight: 179 lb (81.2 kg) IBW/kg (Calculated) : 68.4   Vital Signs: Temp: 98.2 F (36.8 C) (07/10 0700) Temp Source: Oral (07/10 0700) BP: 99/65 (07/10 0700) Pulse Rate: 80 (07/10 0700) Labs:  Recent Labs  12/01/16 0721  WBC 6.9  HGB 13.7  HCT 38.2*  PLT 180   Estimated Creatinine Clearance: 115.2 mL/min (A) (by C-G formula based on SCr of 0.54 mg/dL (L)).   Plan:  Patient eligible for dispensing per [facility] site. Reported todays ANC 3600 to site. Next ANC due:  July 17  July 19, PharmD, BCPS 12/01/2016 11:10 AM

## 2016-12-01 NOTE — [facility] Group Notes (Signed)
[facility] Group Notes:  (Nursing/MHT/Case Management/Adjunct)  Date:  12/01/2016  Time:  1:49 PM  Type of Therapy:  Psychoeducational Skills  Participation Level:  Did Not Attend  Lynelle Smoke [facility] 12/01/2016, 1:49 PM

## 2016-12-01 NOTE — Progress Notes (Signed)
Pt continues to be isolative to room. Pt does not interact much with staff or peers. Pt denies current SI, HI, a/v hallucinations. Pt encouraged by staff to perform personal hygiene daily. Pt is medication and meal compliant. No aggressive or hostile behaviors noted. Will continue to monitor.

## 2016-12-01 NOTE — Social Work (Signed)
CSW spoke with patient's sister in law @ [phone] to discuss discharge plans. Sister in law expressed understanding and stated that she will pick patient belongings up from [facility]. CSW made Tami Lin aware. Ms. Aundria Rud stated that she will meet with sister and law this week.  CSW follow-up with [facility] referral - Waldon Merl and Larita Fife from ACTT needed additional information to process referral. CSW is waiting for ACTT lead to provide additional paperwork.  Patient will potentially discharge at the end of this week and continue ECT outpatient at [facility] and begin ACTT services with [facility] at discharge as well.   Hampton Abbot, MSW, LCSW-A 12/01/2016, 11:45AM

## 2016-12-01 NOTE — [facility] Group Notes (Signed)
[facility] Group Notes:  (Nursing/MHT/Case Management/Adjunct)  Date:  12/01/2016  Time:  11:05 PM  Type of Therapy:  Group Therapy  Participation Level:  Minimal  Participation Quality:  Appropriate  Affect:  Appropriate  Cognitive:  Appropriate  Insight:  Good  Engagement in Group:  Engaged  Modes of Intervention:  Activity  Summary of Progress/Problems:  Mayra Neer 12/01/2016, 11:05 PM

## 2016-12-02 ENCOUNTER — Inpatient Hospital Stay: Payer: Medicare Other | Admitting: Anesthesiology

## 2016-12-02 LAB — GLUCOSE, CAPILLARY
GLUCOSE-CAPILLARY: 157 mg/dL — AB (ref 65–99)
Glucose-Capillary: 148 mg/dL — ABNORMAL HIGH (ref 65–99)
Glucose-Capillary: 270 mg/dL — ABNORMAL HIGH (ref 65–99)
Glucose-Capillary: 240 mg/dL — ABNORMAL HIGH (ref 65–99)

## 2016-12-02 MED ORDER — METHOHEXITAL SODIUM 100 MG/10ML IV SOSY
PREFILLED_SYRINGE | INTRAVENOUS | Status: DC | PRN
  Administered 2016-12-02: 80 mg via INTRAVENOUS

## 2016-12-02 MED ORDER — SODIUM CHLORIDE 0.9 % IV SOLN
500.0000 mL | Freq: Once | INTRAVENOUS | Status: AC
  Administered 2016-12-02: 500 mL via INTRAVENOUS

## 2016-12-02 MED ORDER — MIDAZOLAM HCL 2 MG/2ML IJ SOLN
INTRAMUSCULAR | Status: AC
  Filled 2016-12-02: qty 4

## 2016-12-02 MED ORDER — MIDAZOLAM HCL 2 MG/2ML IJ SOLN
4.0000 mg | Freq: Once | INTRAMUSCULAR | Status: AC
  Administered 2016-12-02 (×2): 2 mg via INTRAVENOUS

## 2016-12-02 MED ORDER — GLYCOPYRROLATE 0.2 MG/ML IJ SOLN
0.2000 mg | Freq: Once | INTRAMUSCULAR | Status: AC
  Administered 2016-12-02: 0.2 mg via INTRAVENOUS

## 2016-12-02 MED ORDER — SODIUM CHLORIDE 0.9 % IV SOLN
INTRAVENOUS | Status: DC | PRN
  Administered 2016-12-02: 10:00:00 via INTRAVENOUS

## 2016-12-02 MED ORDER — HALOPERIDOL LACTATE 5 MG/ML IJ SOLN
INTRAMUSCULAR | Status: DC | PRN
  Administered 2016-12-02: 5 mg via INTRAVENOUS

## 2016-12-02 MED ORDER — SUCCINYLCHOLINE CHLORIDE 200 MG/10ML IV SOSY
PREFILLED_SYRINGE | INTRAVENOUS | Status: DC | PRN
  Administered 2016-12-02: 100 mg via INTRAVENOUS

## 2016-12-02 MED ORDER — GLYCOPYRROLATE 0.2 MG/ML IJ SOLN
INTRAMUSCULAR | Status: AC
  Administered 2016-12-02: 0.2 mg via INTRAVENOUS
  Filled 2016-12-02: qty 1

## 2016-12-02 NOTE — Plan of Care (Signed)
Problem: Coping: Goal: Ability to identify and develop effective coping behavior will improve Outcome: Progressing Pt calm and cooperative. Able to express thoughts and feelings as needed

## 2016-12-02 NOTE — Plan of Care (Signed)
Problem: Safety: Goal: Periods of time without injury will increase Outcome: Progressing Pt remains safe while in hospital

## 2016-12-02 NOTE — Anesthesia Procedure Notes (Signed)
Date/Time: 12/02/2016 10:48 AM Performed by: Lily Kocher Pre-anesthesia Checklist: Patient identified, Emergency Drugs available, Suction available and Patient being monitored Patient Re-evaluated:Patient Re-evaluated prior to inductionOxygen Delivery Method: Circle system utilized Preoxygenation: Pre-oxygenation with 100% oxygen Intubation Type: IV induction Ventilation: Mask ventilation without difficulty and Mask ventilation throughout procedure Airway Equipment and Method: Bite block Placement Confirmation: positive ETCO2 Dental Injury: Teeth and Oropharynx as per pre-operative assessment

## 2016-12-02 NOTE — Tx Team (Signed)
Interdisciplinary Treatment and Diagnostic Plan Update  12/02/2016 Time of Session: 11:00am Tony Cunningham MRN: [id number]  Principal Diagnosis: Schizoaffective disorder, depressive type (HCC)  Secondary Diagnoses: Principal Problem:   Schizoaffective disorder, depressive type (HCC) Active Problems:   Diabetes (HCC)   HTN (hypertension)   Tobacco use disorder   Dyslipidemia   Asthma   Tardive dyskinesia   Polysubstance abuse   Current Medications:  Current Facility-Administered Medications  Medication Dose Route Frequency Provider Last Rate Last Dose  . acetaminophen (TYLENOL) tablet 650 mg  650 mg Oral Q6H PRN [name], [name], MD   650 mg at 11/19/16 1739  . alum & mag hydroxide-simeth (MAALOX/MYLANTA) 200-200-20 MG/5ML suspension 30 mL  30 mL Oral Q4H PRN [name], [name], MD      . cloZAPine (CLOZARIL) tablet 100 mg  100 mg Oral QHS [name], [name], MD   100 mg at 12/01/16 2224  . dextrose 5 %-0.45 % sodium chloride infusion   Intravenous Continuous [name], [name], MD 10 mL/hr at 11/13/16 1009    . feeding supplement (GLUCERNA SHAKE) (GLUCERNA SHAKE) liquid 237 mL  237 mL Oral TID WC [name], [name], MD   237 mL at 12/01/16 1737  . fluvoxaMINE (LUVOX) tablet 100 mg  100 mg Oral QHS [name], [name], MD   100 mg at 12/01/16 2224  . glycopyrrolate (ROBINUL) injection 0.2 mg  0.2 mg Intravenous Once [name], [name], MD      . insulin aspart (novoLOG) injection 0-15 Units  0-15 Units Subcutaneous TID WC Alford Highland, MD   11 Units at 12/01/16 1634  . insulin aspart (novoLOG) injection 0-5 Units  0-5 Units Subcutaneous QHS Alford Highland, MD   2 Units at 12/01/16 2228  . insulin aspart (novoLOG) injection 10 Units  10 Units Subcutaneous TID WC Enedina Finner, MD   10 Units at 12/01/16 1633  . insulin glargine (LANTUS) injection 55 Units  55 Units Subcutaneous QHS Enedina Finner, MD   55 Units at 12/01/16 2225  . lisinopril (PRINIVIL,ZESTRIL) tablet 20 mg  20 mg Oral Daily  [name], [name], MD   20 mg at 12/02/16 0653  . magnesium hydroxide (MILK OF MAGNESIA) suspension 30 mL  30 mL Oral Daily PRN [name], [name], MD      . meloxicam (MOBIC) tablet 7.5 mg  7.5 mg Oral BID PC [name], [name], MD   7.5 mg at 12/01/16 1737  . metFORMIN (GLUCOPHAGE) tablet 1,000 mg  1,000 mg Oral BID WC [name], [name], MD   1,000 mg at 12/01/16 1737  . midazolam (VERSED) injection 4 mg  4 mg Intravenous Once [name], [name], MD      . montelukast (SINGULAIR) tablet 10 mg  10 mg Oral QHS [name], [name], MD   10 mg at 12/01/16 2229  . nicotine (NICODERM CQ - dosed in mg/24 hr) patch 7 mg  7 mg Transdermal Daily Darliss Ridgel, MD   7 mg at 12/01/16 0857  . pantoprazole (PROTONIX) EC tablet 40 mg  40 mg Oral Daily [name], [name], MD   40 mg at 12/02/16 0653  . polyethylene glycol (MIRALAX / GLYCOLAX) packet 17 g  17 g Oral Daily [name], [name], MD   17 g at 11/30/16 1230  . promethazine (PHENERGAN) tablet 12.5 mg  12.5 mg Oral Q6H PRN [name], [name], MD      . propranolol (INDERAL) tablet 20 mg  20 mg Oral BID [name], [name], MD  20 mg at 12/01/16 1737  . simvastatin (ZOCOR) tablet 40 mg  40 mg Oral q1800 [name], [name], MD   40 mg at 12/01/16 1737  . temazepam (RESTORIL) capsule 15 mg  15 mg Oral QHS [name], [name], MD   15 mg at 12/01/16 2225   Facility-Administered Medications Ordered in Other Encounters  Medication Dose Route Frequency Provider Last Rate Last Dose  . 0.9 %  sodium chloride infusion    Continuous PRN Lily Kocher, CRNA      . methohexital Sodium   Intravenous Anesthesia Intra-op Lily Kocher, CRNA   80 mg at 12/02/16 1045  . succinylcholine (ANECTINE) syringe   Intravenous Anesthesia Intra-op Lily Kocher, CRNA   100 mg at 12/02/16 1046   PTA Medications: Prescriptions Prior to Admission  Medication Sig Dispense Refill Last Dose  . cloZAPine (CLOZARIL) 100 MG tablet Take 1 tablet (100 mg total) by mouth daily. Daily at 1700 30 tablet 0  11/12/2016  . cyclobenzaprine (FLEXERIL) 10 MG tablet Take 1 tablet (10 mg total) by mouth 3 (three) times daily as needed for muscle spasms. 60 tablet 0 11/12/2016  . fluvoxaMINE (LUVOX) 50 MG tablet Take 1 tablet (50 mg total) by mouth daily. Daily at 1700 30 tablet 0 11/12/2016  . insulin aspart (NOVOLOG) 100 UNIT/ML injection Inject 5 Units into the skin 3 (three) times daily with meals. 5 mL 0 11/12/2016  . insulin glargine (LANTUS) 100 UNIT/ML injection Inject 0.42 mLs (42 Units total) into the skin at bedtime. 13 mL 0 11/12/2016  . ipratropium (ATROVENT) 0.06 % nasal spray Place 1 spray into both nostrils daily after supper. 3 mL 0 11/12/2016  . lidocaine (LIDODERM) 5 % Place 1 patch onto the skin daily. Remove & Discard patch within 12 hours or as directed by MD 30 patch 0 11/12/2016  . lisinopril (PRINIVIL,ZESTRIL) 20 MG tablet Take 1 tablet (20 mg total) by mouth daily. 30 tablet 0 11/30/2016 at 0644  . meloxicam (MOBIC) 7.5 MG tablet Take 1 tablet (7.5 mg total) by mouth 2 (two) times daily. 60 tablet 0 11/12/2016  . metFORMIN (GLUCOPHAGE) 1000 MG tablet Take 1 tablet (1,000 mg total) by mouth 2 (two) times daily with a meal. 60 tablet 0 11/12/2016  . montelukast (SINGULAIR) 10 MG tablet Take 1 tablet (10 mg total) by mouth at bedtime. For Asthma 30 tablet 0 11/12/2016  . polyethylene glycol (MIRALAX / GLYCOLAX) packet Take 17 g by mouth daily. 30 each 0 11/12/2016  . propranolol (INDERAL) 20 MG tablet Take 1 tablet (20 mg total) by mouth 2 (two) times daily. 60 tablet 0 11/30/2016 at  0644  . simvastatin (ZOCOR) 40 MG tablet Take 1 tablet (40 mg total) by mouth at bedtime. For high cholesterol 15 tablet 0 11/12/2016    Patient Stressors:    Patient Strengths:    Treatment Modalities: Medication Management, Group therapy, Case management,  1 to 1 session with clinician, Psychoeducation, Recreational therapy.   Physician Treatment Plan for Primary Diagnosis: Schizoaffective disorder, depressive  type (HCC) Long Term Goal(s): Improvement in symptoms so as ready for discharge Improvement in symptoms so as ready for discharge   Short Term Goals: Ability to verbalize feelings will improve Ability to demonstrate self-control will improve Ability to identify and develop effective coping behaviors will improve Ability to identify and develop effective coping behaviors will improve Ability to identify triggers associated with substance abuse/mental health issues will improve  Medication Management: Evaluate patient's response, side effects, and tolerance  of medication regimen.  Therapeutic Interventions: 1 to 1 sessions, Unit Group sessions and Medication administration.  Evaluation of Outcomes: Progressing  Physician Treatment Plan for Secondary Diagnosis: Principal Problem:   Schizoaffective disorder, depressive type (HCC) Active Problems:   Diabetes (HCC)   HTN (hypertension)   Tobacco use disorder   Dyslipidemia   Asthma   Tardive dyskinesia   Polysubstance abuse  Long Term Goal(s): Improvement in symptoms so as ready for discharge Improvement in symptoms so as ready for discharge   Short Term Goals: Ability to verbalize feelings will improve Ability to demonstrate self-control will improve Ability to identify and develop effective coping behaviors will improve Ability to identify and develop effective coping behaviors will improve Ability to identify triggers associated with substance abuse/mental health issues will improve     Medication Management: Evaluate patient's response, side effects, and tolerance of medication regimen.  Therapeutic Interventions: 1 to 1 sessions, Unit Group sessions and Medication administration.  Evaluation of Outcomes: Progressing   RN Treatment Plan for Primary Diagnosis: Schizoaffective disorder, depressive type (HCC) Long Term Goal(s): Knowledge of disease and therapeutic regimen to maintain health will improve  Short Term Goals:  Ability to identify and develop effective coping behaviors will improve and Compliance with prescribed medications will improve  Medication Management: RN will administer medications as ordered by provider, will assess and evaluate patient's response and provide education to patient for prescribed medication. RN will report any adverse and/or side effects to prescribing provider.  Therapeutic Interventions: 1 on 1 counseling sessions, Psychoeducation, Medication administration, Evaluate responses to treatment, Monitor vital signs and CBGs as ordered, Perform/monitor CIWA, COWS, AIMS and Fall Risk screenings as ordered, Perform wound care treatments as ordered.  Evaluation of Outcomes: Progressing   LCSW Treatment Plan for Primary Diagnosis: Schizoaffective disorder, depressive type (HCC) Long Term Goal(s): Safe transition to appropriate next level of care at discharge, Engage patient in therapeutic group addressing interpersonal concerns.  Short Term Goals: Engage patient in aftercare planning with referrals and resources and Increase skills for wellness and recovery  Therapeutic Interventions: Assess for all discharge needs, 1 to 1 time with Social worker, Explore available resources and support systems, Assess for adequacy in community support network, Educate family and significant other(s) on suicide prevention, Complete Psychosocial Assessment, Interpersonal group therapy.  Evaluation of Outcomes: Progressing   Progress in Treatment: Attending groups: No. Participating in groups: No. Taking medication as prescribed: Yes. Toleration medication: Yes. Family/Significant other contact made: Yes, individual(s) contacted:  mother Patient understands diagnosis: Yes. Discussing patient identified problems/goals with staff: Yes. Medical problems stabilized or resolved: Yes. Denies suicidal/homicidal ideation: Yes. Issues/concerns per patient self-inventory: No. Other: n/a  New problem(s)  identified: None identified at this time.   New Short Term/Long Term Goal(s): None identified at this time. Pt has not been participating in unit programming and mostly isolates/withdraws to room with minimum interaction with peers or staff. CSW will continue to encourager patient to attend groups.   Discharge Plan or Barriers: ECT treatments resumed on July 2nd, Pt will return home with Mother and needs referral to ACTT team in [place] or other appropriate outpatient services.   CSW is awaiting [facility] assessment.   Reason for Continuation of Hospitalization: Depression Medication stabilization Other; describe ECT treatment  Estimated Length of Stay: D/C Friday 7/13  Attendees: Patient: 12/02/2016 10:49 AM  Physician: [name], MD 12/02/2016 10:49 AM  Nursing: Hulan Amato, RN 12/02/2016 10:49 AM  RN Care Manager: 12/02/2016 10:49 AM  Social Worker:  Hampton Abbot, MSW, LCSW-A 12/02/2016 10:49 AM  Recreational Therapist:  12/02/2016 10:49 AM  Other:  12/02/2016 10:49 AM  Other:  12/02/2016 10:49 AM  Other: 12/02/2016 10:49 AM    Scribe for Treatment Team: Lynden Oxford, LCSWA 12/02/2016 10:49 AM

## 2016-12-02 NOTE — [facility] Group Notes (Signed)
[facility] LCSW Group Therapy   12/02/2016  1:00 pm   Type of Therapy: Group Therapy   Participation Level: Patient invited but did not attend.    Hampton Abbot, MSW, LCSWA 12/02/2016, 2:11PM

## 2016-12-02 NOTE — Transfer of Care (Signed)
Immediate Anesthesia Transfer of Care Note  Patient: Tony Cunningham  Procedure(s) Performed: ECT  Patient Location: PACU  Anesthesia Type:General  Level of Consciousness: sedated  Airway & Oxygen Therapy: Patient Spontanous Breathing  Post-op Assessment: Report given to RN and Post -op Vital signs reviewed and stable  Post vital signs: Reviewed and stable  Last Vitals:  Vitals:   12/02/16 0701 12/02/16 0855  BP: 102/70 115/85  Pulse: 87 61  Resp: 19 18  Temp: 36.8 C 36.7 C    Last Pain:  Vitals:   12/02/16 0855  TempSrc: Oral  PainSc:          Complications: No apparent anesthesia complications

## 2016-12-02 NOTE — Progress Notes (Signed)
Pt tolerated ECT well however complains of feeling "tired".Pt seen resting in room. Pt is isolative to room. He is in stable condition pt was given noon dose of insulin with his lunch. Will continue to monitor.

## 2016-12-02 NOTE — Anesthesia Post-op Follow-up Note (Cosign Needed)
Anesthesia QCDR form completed.        

## 2016-12-02 NOTE — Plan of Care (Signed)
Problem: Coping: Goal: Ability to interact with others will improve Outcome: Progressing Pt visible in the milieu, in the dayroom with peers

## 2016-12-02 NOTE — Progress Notes (Signed)
Patient was resting in bed at the beginning of this shift. Pt then went to the dayroom and watched TV. Pt is alert and oriented and denying suicidal thoughts. Denying hallucinations. Pt able to express his thoughts and feelings appropriately. Was encouraged to talk to staff as needed. Safety precautions maintained.

## 2016-12-02 NOTE — Anesthesia Preprocedure Evaluation (Signed)
Anesthesia Evaluation  Patient identified by MRN, date of birth, ID band Patient awake    Reviewed: Allergy & Precautions, NPO status , Patient's Chart, lab work & pertinent test results  History of Anesthesia Complications Negative for: history of anesthetic complications  Airway Mallampati: II  TM Distance: >3 FB Neck ROM: Full    Dental  (+) Poor Dentition   Pulmonary asthma , Current Smoker,    breath sounds clear to auscultation- rhonchi (-) wheezing      Cardiovascular hypertension, Pt. on medications (-) CAD, (-) Past MI and (-) Cardiac Stents  Rhythm:Regular Rate:Normal - Systolic murmurs and - Diastolic murmurs    Neuro/Psych PSYCHIATRIC DISORDERS Anxiety Depression Schizophrenia negative neurological ROS     GI/Hepatic negative GI ROS, Neg liver ROS,   Endo/Other  diabetes, Insulin Dependent  Renal/GU negative Renal ROS     Musculoskeletal negative musculoskeletal ROS (+)   Abdominal (+) - obese,   Peds  Hematology negative hematology ROS (+)   Anesthesia Other Findings Past Medical History: No date: Anxiety No date: Asthma No date: Diabetes mellitus No date: High blood pressure No date: Sinus complaint   Reproductive/Obstetrics                             Anesthesia Physical  Anesthesia Plan  ASA: II  Anesthesia Plan: General   Post-op Pain Management:    Induction: Intravenous  PONV Risk Score and Plan: 0  Airway Management Planned: Mask  Additional Equipment:   Intra-op Plan:   Post-operative Plan:   Informed Consent: I have reviewed the patients History and Physical, chart, labs and discussed the procedure including the risks, benefits and alternatives for the proposed anesthesia with the patient or authorized representative who has indicated his/her understanding and acceptance.   Dental advisory given  Plan Discussed with: CRNA and  Anesthesiologist  Anesthesia Plan Comments:         Anesthesia Quick Evaluation

## 2016-12-02 NOTE — Procedures (Signed)
ECT SERVICES Physician's Interval Evaluation & Treatment Note  Patient Identification: Tony Cunningham MRN:  [id number] Date of Evaluation:  12/02/2016 TX #: [id number]  MADRS:   MMSE:   P.E. Findings:  No change to physical exam. Heart and lungs normal. Vitals unremarkable  Psychiatric Interval Note:  No specific complaints but still very slow and flat. Denies hallucinations.  Subjective:  Patient is a 41 y.o. male seen for evaluation for Electroconvulsive Therapy. No specific complaints  Treatment Summary:   []   Right Unilateral             [x]  Bilateral   % Energy : 1.0 ms 60%   Impedance: 1600 ohms  Seizure Energy Index: No reading  Postictal Suppression Index: Also no reading  Seizure Concordance Index: 90%  Medications  Pre Shock: Brevital 80 mg succinylcholine 100 mg  Post Shock: Versed 4 mg Haldol 5 mg  Seizure Duration: EMG 55 seconds EEG 68 seconds   Comments: Follow-up in 2 days on Friday   Lungs:  [x]   Clear to auscultation               []  Other:   Heart:    [x]   Regular rhythm             []  irregular rhythm    [x]   Previous H&P reviewed, patient examined and there are NO CHANGES                 []   Previous H&P reviewed, patient examined and there are changes noted.   , MD 7/11/201810:42 AM

## 2016-12-02 NOTE — Progress Notes (Signed)
Pt isolative and avoidant of interaction unless prompted.  Denies s/i, h/i or hallucinations though appears preoccupied.  Pt was able to show some minor humor during the med pass. Poor hygiene  And is prompted to shower in am prior to ECT in am.  NPO after midnoc for ECT.

## 2016-12-02 NOTE — Anesthesia Postprocedure Evaluation (Addendum)
Anesthesia Post Note  Patient: Tony Cunningham  Procedure(s) Performed: * No procedures listed *  Patient location during evaluation: PACU Anesthesia Type: General Level of consciousness: awake and alert Pain management: pain level controlled Vital Signs Assessment: post-procedure vital signs reviewed and stable Respiratory status: spontaneous breathing, nonlabored ventilation and respiratory function stable Cardiovascular status: blood pressure returned to baseline and stable Postop Assessment: no signs of nausea or vomiting Anesthetic complications: no     Last Vitals:  Vitals:   12/02/16 1140 12/02/16 1142  BP: 122/84   Pulse: 81   Resp:  20  Temp:  (!) 36.4 C    Last Pain:  Vitals:   12/02/16 1140  TempSrc:   PainSc: 0-No pain                 [name]

## 2016-12-02 NOTE — H&P (Signed)
Tony Cunningham is an 41 y.o. male.   Chief Complaint: Patient continues to feel a little down and slow but better than before. Still not very verbal and doesn't really articulate how he is feeling no side effects HPI: History of recurrent severe depression and possibly bipolar a little unclear on the history  Past Medical History:  Diagnosis Date  . Anxiety   . Asthma   . Diabetes mellitus   . High blood pressure   . Sinus complaint     History reviewed. No pertinent surgical history.  History reviewed. No pertinent family history. Social History:  reports that he has been smoking Cigarettes.  He has been smoking about 0.50 packs per day. He has never used smokeless tobacco. He reports that he does not drink alcohol or use drugs.  Allergies: No Known Allergies  Medications Prior to Admission  Medication Sig Dispense Refill  . cloZAPine (CLOZARIL) 100 MG tablet Take 1 tablet (100 mg total) by mouth daily. Daily at 1700 30 tablet 0  . cyclobenzaprine (FLEXERIL) 10 MG tablet Take 1 tablet (10 mg total) by mouth 3 (three) times daily as needed for muscle spasms. 60 tablet 0  . fluvoxaMINE (LUVOX) 50 MG tablet Take 1 tablet (50 mg total) by mouth daily. Daily at 1700 30 tablet 0  . insulin aspart (NOVOLOG) 100 UNIT/ML injection Inject 5 Units into the skin 3 (three) times daily with meals. 5 mL 0  . insulin glargine (LANTUS) 100 UNIT/ML injection Inject 0.42 mLs (42 Units total) into the skin at bedtime. 13 mL 0  . ipratropium (ATROVENT) 0.06 % nasal spray Place 1 spray into both nostrils daily after supper. 3 mL 0  . lidocaine (LIDODERM) 5 % Place 1 patch onto the skin daily. Remove & Discard patch within 12 hours or as directed by MD 30 patch 0  . lisinopril (PRINIVIL,ZESTRIL) 20 MG tablet Take 1 tablet (20 mg total) by mouth daily. 30 tablet 0  . meloxicam (MOBIC) 7.5 MG tablet Take 1 tablet (7.5 mg total) by mouth 2 (two) times daily. 60 tablet 0  . metFORMIN (GLUCOPHAGE) 1000 MG tablet  Take 1 tablet (1,000 mg total) by mouth 2 (two) times daily with a meal. 60 tablet 0  . montelukast (SINGULAIR) 10 MG tablet Take 1 tablet (10 mg total) by mouth at bedtime. For Asthma 30 tablet 0  . polyethylene glycol (MIRALAX / GLYCOLAX) packet Take 17 g by mouth daily. 30 each 0  . propranolol (INDERAL) 20 MG tablet Take 1 tablet (20 mg total) by mouth 2 (two) times daily. 60 tablet 0  . simvastatin (ZOCOR) 40 MG tablet Take 1 tablet (40 mg total) by mouth at bedtime. For high cholesterol 15 tablet 0    Results for orders placed or performed during the hospital encounter of 11/06/16 (from the past 48 hour(s))  Glucose, capillary     Status: Abnormal   Collection Time: 11/30/16 11:23 AM  Result Value Ref Range   Glucose-Capillary 140 (H) 65 - 99 mg/dL  Glucose, capillary     Status: Abnormal   Collection Time: 11/30/16 12:24 PM  Result Value Ref Range   Glucose-Capillary 152 (H) 65 - 99 mg/dL  Glucose, capillary     Status: Abnormal   Collection Time: 11/30/16  4:28 PM  Result Value Ref Range   Glucose-Capillary 159 (H) 65 - 99 mg/dL  Glucose, capillary     Status: Abnormal   Collection Time: 11/30/16  8:14 PM  Result Value Ref  Range   Glucose-Capillary 145 (H) 65 - 99 mg/dL   Comment 1 Notify RN   Glucose, capillary     Status: Abnormal   Collection Time: 12/01/16  6:47 AM  Result Value Ref Range   Glucose-Capillary 175 (H) 65 - 99 mg/dL  CBC with Differential/Platelet     Status: Abnormal   Collection Time: 12/01/16  7:21 AM  Result Value Ref Range   WBC 6.9 3.8 - 10.6 K/uL   RBC 4.34 (L) 4.40 - 5.90 MIL/uL   Hemoglobin 13.7 13.0 - 18.0 g/dL   HCT 96.2 (L) 22.9 - 79.8 %   MCV 88.0 80.0 - 100.0 fL   MCH 31.5 26.0 - 34.0 pg   MCHC 35.8 32.0 - 36.0 g/dL   RDW 92.1 19.4 - 17.4 %   Platelets 180 150 - 440 K/uL   Neutrophils Relative % 53 %   Neutro Abs 3.6 1.4 - 6.5 K/uL   Lymphocytes Relative 35 %   Lymphs Abs 2.4 1.0 - 3.6 K/uL   Monocytes Relative 6 %   Monocytes  Absolute 0.4 0.2 - 1.0 K/uL   Eosinophils Relative 5 %   Eosinophils Absolute 0.4 0 - 0.7 K/uL   Basophils Relative 1 %   Basophils Absolute 0.1 0 - 0.1 K/uL  Glucose, capillary     Status: Abnormal   Collection Time: 12/01/16 11:24 AM  Result Value Ref Range   Glucose-Capillary 113 (H) 65 - 99 mg/dL  Glucose, capillary     Status: Abnormal   Collection Time: 12/01/16  4:31 PM  Result Value Ref Range   Glucose-Capillary 309 (H) 65 - 99 mg/dL  Glucose, capillary     Status: Abnormal   Collection Time: 12/01/16  8:20 PM  Result Value Ref Range   Glucose-Capillary 206 (H) 65 - 99 mg/dL   Comment 1 Notify RN   Glucose, capillary     Status: Abnormal   Collection Time: 12/02/16  6:41 AM  Result Value Ref Range   Glucose-Capillary 157 (H) 65 - 99 mg/dL   No results found.  Review of Systems  Constitutional: Negative.   HENT: Negative.   Eyes: Negative.   Respiratory: Negative.   Cardiovascular: Negative.   Gastrointestinal: Negative.   Musculoskeletal: Negative.   Skin: Negative.   Neurological: Negative.   Psychiatric/Behavioral: Positive for memory loss. Negative for depression, hallucinations, substance abuse and suicidal ideas. The patient is nervous/anxious. The patient does not have insomnia.     Blood pressure 115/85, pulse 61, temperature 98.1 F (36.7 C), temperature source Oral, resp. rate 18, height 5\' 8"  (1.727 m), weight 81.2 kg (179 lb), SpO2 99 %. Physical Exam  Nursing note and vitals reviewed. Constitutional: He appears well-developed and well-nourished.  HENT:  Head: Normocephalic and atraumatic.  Eyes: Conjunctivae are normal. Pupils are equal, round, and reactive to light.  Neck: Normal range of motion.  Cardiovascular: Regular rhythm and normal heart sounds.   Respiratory: Effort normal. No respiratory distress.  GI: Soft.  Musculoskeletal: Normal range of motion.  Neurological: He is alert.  Skin: Skin is warm and dry.  Psychiatric: His affect is  blunt. His speech is delayed. He is slowed and withdrawn. Thought content is not paranoid. He expresses impulsivity. He expresses no suicidal ideation. He exhibits abnormal recent memory.     Assessment/Plan Follow-up with next treatment on Friday. I think we are starting to see some improvement at this point in the index course.  Saturday, MD 12/02/2016, 10:40 AM

## 2016-12-02 NOTE — Plan of Care (Signed)
Problem: Education: Goal: Emotional status will improve Outcome: Progressing Patient is more open and more involved in activities. Calm and cooperative

## 2016-12-02 NOTE — Plan of Care (Signed)
Problem: Safety: Goal: Periods of time without injury will increase Outcome: Progressing Pt remains free from injury. Safety precautions maintained

## 2016-12-02 NOTE — Plan of Care (Signed)
Problem: Coping: Goal: Participation in decision-making will improve Outcome: Progressing Pt able to discuss his treatment plan and remains compliant

## 2016-12-02 NOTE — Plan of Care (Signed)
Problem: Coping: Goal: Ability to use eye contact when communicating with others will improve Outcome: Progressing Pt communicates openly and eye contact improved

## 2016-12-02 NOTE — Plan of Care (Signed)
Problem: Coping: Goal: Ability to verbalize frustrations and anger appropriately will improve Outcome: Progressing Able to express pain and concerns

## 2016-12-02 NOTE — Progress Notes (Signed)
Pt appeared to sleep about 7.25 hours while monitored on 15 minute safety checks. 

## 2016-12-02 NOTE — Progress Notes (Signed)
Recreation Therapy Notes  Date: 07.11.18 Time: 9:30 am Location: Craft Room  Group Topic: Self-esteem  Goal Area(s) Addresses:  Patient will write at least one positive trait about self. Patient will verbalize benefit of having a healthy self-esteem.  Behavioral Response: Did not attend  Intervention: I Am  Activity: Patients were given a worksheet with the letter I on it and were instructed to write as many positive traits about themselves inside the letter.  Education: LRT educated patients on ways to increase their self-esteem.  Education Outcome: Did not attend   Clinical Observations/Feedback: Did not attend  Jacquelynn Cree, LRT/CTRS 12/02/2016 10:00 AM

## 2016-12-03 ENCOUNTER — Other Ambulatory Visit: Payer: Self-pay | Admitting: Psychiatry

## 2016-12-03 LAB — GLUCOSE, CAPILLARY
GLUCOSE-CAPILLARY: 249 mg/dL — AB (ref 65–99)
GLUCOSE-CAPILLARY: 257 mg/dL — AB (ref 65–99)
GLUCOSE-CAPILLARY: 133 mg/dL — AB (ref 65–99)
Glucose-Capillary: 225 mg/dL — ABNORMAL HIGH (ref 65–99)

## 2016-12-03 NOTE — Progress Notes (Signed)
Patient pleasant and cooperative with care. Med and group compliant. Appropriate with staff and peers. Denies SI, HI, AVH. Pt noted smiling and talking more with staff. Eating meals good. Stays in room most of shift but comes out for meals. Encouragement and support offered. Safety checks maintained. Medications given as prescribed. Pt receptive and remains safe on unit with q 15 min checks.

## 2016-12-03 NOTE — Progress Notes (Signed)
[facility] MD Progress Note  12/03/2016 6:17 PM Quame Spratlin  MRN:  [id number]  Subjective:  Mr. Crisanto is a 41 year old male with prior diagnosis of schizoaffective disorder. He received ECT to be continued next week if appropriate.  He was started on Clozapine and his dose was titrated to 225 mg. He is also on Luvox. Clozapine level was quite high with resulting sedation, because of the combination. Unfortunately in ERROR his Clozapine was discotoniued after Monday. I restarted 100 mg of Clozapine tonight. Actually, he is doing well without it, less sedated. Not knowing he was off Clozapine I took another level Thursday night.  11/08/16. The patient has been very isolative and withdrawn. Hygiene is poor and he is malodorous. He has not showered. He did go outside with the other patients and has been getting up for meals but not attending groups or interacting with staff or peers. He denies any current active or passive suicidal thoughts but does admit to some very vague paranoid thoughts that others are watching him. He is very limited and talking with this Probation officer and wanted to be "left alone to sleep". He denied any current somatic complaints. Vital signs are stable and he slept fairly well last night but is also sleeping partially during the daytime. So far, he is tolerating the Clozaril fairly well without any physical adverse side effects.  Follow-up for this 41 year old man with what appears to be psychotic depression or schizoaffective disorder. Patient remains in bed most of the time. Poorly interactive. Mood is depressed. Passive suicidal thoughts.  Follow-up for Thursday the 21st. Patient remains withdrawn very little activity. Blood sugars are also running much higher. Patient continues to endorse feeling depressed and having vague suicidal thoughts.  Follow-up for Friday the 22nd. Patient finally had ECT this morning bilateral treatment which went off without any difficulty or complication. I saw him  this afternoon and I think he actually was a little more interactive and verbal and energetic than before. This may be wishful thinking but it makes me optimistic. No specific complaints other than headache  Patient was seen today for follow-up. Patient has no new complaints. Continues to say his thoughts are racing but his affect is flat and withdrawn he is almost completely withdrawn from other people and stays in his room by himself. Suicidal thoughts passive no intention or plan. Still has psychotic symptoms. Blood sugars unfortunately continued to run quite high.  6/23 patient denies having any issues or concerns today. Says that he has been eating and sleeping well. Denies side effects from medications or any physical complaints. Denies suicidality or homicidality. He says that he might be hallucinating but he is not sure. He said that he had 1 ECT procedure and it went well.   Blood glucose this morning was 92. Patient is compliant with medications. No major events overnight per nursing.  6/24 patient did not sleep well last night. Other than that he denies having any issues or concerns. He denies suicidality, homicidality or auditory visual hallucinations. During examination he appears much brighter and more conversational. His thought processes are linear.  11/16/2016. Mr. Boody does not like talking to me today. He is in his room he is in bed He does not open his eyes for me and is unable to keep a conversation he keeps telling me okay okay and is unwilling to answer any questions. He received just one ECT treatment last week. Since Dr. Weber Cooks is on vacation, ECT will resume next week.  He  accepts medications and seems to tolerate them well Clozaril level is over 700. This is probably from a combination of clozapine and Luvox. I will alert dose of clozapine to 200 mg  To avoid side effects. I will switch to Luvox to 1 nightly dose to improve sleep.  11/17/2016. [name] is much more animated and  interactive today. He is in his room in bed bugs is able to hold a conversation and maintains good eye contact. He complains of stomach ache. He did have a bowel movement this morning. His mood is still depressed and he needs much encouragement to attend to his ADLs. Staff needs to strongly encourage showering. He has high hopes for ECT and will not stay in the hospital until next week to resume ECT with Dr. Weber Cooks Is back from vacation. Yesterday he is agreeable to the nighttime and clozapine dose decrease due to high clozapine level. This could have helped the patient stated more awake this morning. He slept almost 6 hours.  11/18/2016. [name] seems more relaxed and talkative today. He is still very flat on his back but opens his eyes for me to we are having a small conversation. He has not been sleeping well. There are in spite of medication adjustments. He believes that trazodone works well for him. I will start trazodone tonight. She no longer complains of stomach pain. He does not participate in programming but takes medications with no problem. She met with the group home representative this week and was accepted to a group home with a bed open at the beginning of July. When asked returns from vacation, he will decide whether to continue ECT or whether the patient will be discharged.  11/19/2016. [name] seems better today. This morning he has been up and walking around. He complains of "tension" in his head both mental and physical. He slept 3 hours only again in spite of multiple sedating medications given at night including Trazodone. I will switch him to Restoril tonight. It may need to be discontinued if her restarts ECT next week. No other somatic complaints. Sugers still elevated at times. I changed diet to ADA and discontinued Glucerna. Input from diabetes nurse coordinator and medicine consultant is greatly appreciated. He does not interact with staff/peers or participate in programing. Hygiene  is still questionable.  11/20/2016. [name] is up and about today. He comes to my office for interview. He seems more relaxed today. Still very vague in his answers. Complains of back and neck pain but has been in bed on his back most of the week. His sugar was low today as he was switched to ADA diet and Glucerna was discontinued upon [facility] suggestion. He is looking forward to see Dr. Weber Cooks "who is the best". He corrects himself to tell me that I am not bad either.  Follow-up for June 30. Patient seen. Spoke with nursing. Patient continues to report feeling bad. Mood feels depressed. Denies acute suicidal thoughts but feels hopeless about the future. Feels paranoid and vaguely report psychosis. Patient continues to have poor self-care and poor interaction with others on the unit. Physically he seems to be stabilizing a little.  Follow-up Sunday, July 1. Patient seen. He looks a little better groomed today. He has been up out of his bed although he does not attend any groups. He is ambivalent about his mood today. Denies acute hallucinations or suicidal thinking. Still feels hopeless about discharge.  Patient seen for follow-up on Monday, July 2. He  had ECT treatment this morning which was complicated by some delirium and agitation during the recovery phase. Otherwise treatment went fine. Seen this afternoon the patient is still a little groggy from the medicines he received. No specific physical complaints however. Mood is still depressed and he is still slow and cognitively impaired. Denies acute suicidal intent. Sugars continue to be very labile. He is requesting a restart of his Glucerna but I am unsure whether that is appropriate.  [place] Tuesday, July 3. Patient has no new complaints. In fact today he seems to think he is doing a little bit better. His affect looked brighter and he was more conversant with me. He is taking some steps to improve his appearance.  Follow-up for Wednesday, July 4.  Patient seen. His only complaint today is wanting to have the dietary supplement Glucerna shake back. He thinks his mood is feeling better. He lets me know that he has been out of bed watching TV and being around people. Hallucinations are minimal. Denies suicidal thoughts. Admits that he is feeling a little more optimistic about going home with his family.  Follow-up for Thursday, July 5. Patient says he is feeling a little better than yesterday. Still feels anxious much of the time. He has made the effort to go to groups a little bit. Thought still are scattered and a little disorganized. Stays withdrawn most of the time. Denies however having active suicidal thoughts. Sugars continue to be labile but nothing terrible  Follow-up for Friday the sixth. Patient had ECT today which was tolerated well although he still has some agitation during recovery. Overall however his mood is improving. He is more interactive makes better eye contact and has managed to get up and be around other patients better. Denies any suicidal thoughts currently. Still feels confused and gets very nervous but denies he's having hallucinations.  11/28/2016- Patient continues to report being depressed. Has poor hygiene. Patient with strong malodor. He has not been attending any programming. Denies suicidal thoughts. Patient had 3 ECT treatments so far.  11/29/2016 Patient sleeping in his room. Continues to just lie in his bed, not attending to his hygiene despite his nurse coaxing him. His room has been cleaned and all sheets changed.   Follow-up for Monday, July 9. Patient had ECT treatment this morning. Bilateral treatment tolerated well recovered without difficulty. Seen this afternoon he is a little bit tired but says that overall he thinks he is getting better. Still stays pretty isolated. Hygiene fortunately has improved over the weekend.  Follow-up for Tuesday, July 10. Patient seen this afternoon. Unfortunately he is still  in bed. It is rare for me to ever catch him out of bed or interacting. He claims that he has gone to some groups however. He says his mood is feeling better and he denies any hallucinations. He does admit to still feeling anxious and worried about how he is going to function when he goes home to stay with his mother. Blood sugars are actually looking a little bit more stable thank you very much to the hospitalists.  Follow-up note for Thursday the 12th. Patient seen. He actually was up watching television this evening. He smiled and said that he was feeling much better. Seems to be actually starting to show some serious improvement. Denies having any hallucinations or suicidal thoughts today. We discussed the possibility of discharge however and he is hesitant because he wants to be as good as possible when he is discharged. Sugars are  staying reasonably good given his history  Per nursing: Pt calm, cooperative and pleasant.  AM BS 133.  Speech is more clear and logical, though remains hypoverbal.  Remains frequently isolative to room.  No behavioral issues.     Principal Problem: Schizoaffective disorder, depressive type ([place]) Diagnosis:   Patient Active Problem List   Diagnosis Date Noted  . Schizoaffective disorder, depressive type ([place]) [F25.1] 09/23/2016  . Polysubstance abuse [F19.10] 07/17/2016  . Tardive dyskinesia [G24.01] 07/16/2016  . Tobacco use disorder [F17.200] 07/07/2016  . Dyslipidemia [E78.5] 07/07/2016  . Asthma [J45.909] 07/07/2016  . HTN (hypertension) [I10] 07/06/2016  . Diabetes ([place]) [E11.9] 12/25/2010   Total Time spent with patient: 20 minutes    Past Medical History:  Past Medical History:  Diagnosis Date  . Anxiety   . Asthma   . Diabetes mellitus   . High blood pressure   . Sinus complaint    History reviewed. No pertinent surgical history.  Social History:  History  Alcohol Use No     History  Drug Use No    Social History   Social History   . Marital status: Single    Spouse name: [name]  . Number of children: [name]  . Years of education: [name]   Social History Main Topics  . Smoking status: Current Every Day Smoker    Packs/day: 0.50    Types: Cigarettes  . Smokeless tobacco: Never Used  . Alcohol use No  . Drug use: No  . Sexual activity: Not Currently   Other Topics Concern  . None   Social History Narrative  . None   Additional Social History:                         Sleep: Good  Appetite:  Good  Current Medications: Current Facility-Administered Medications  Medication Dose Route Frequency Provider Last Rate Last Dose  . acetaminophen (TYLENOL) tablet 650 mg  650 mg Oral Q6H PRN [name], Madie Reno, MD   650 mg at 11/19/16 1739  . alum & mag hydroxide-simeth (MAALOX/MYLANTA) 200-200-20 MG/5ML suspension 30 mL  30 mL Oral Q4H PRN [name], [name], MD      . cloZAPine (CLOZARIL) tablet 100 mg  100 mg Oral QHS [name], [name], MD   100 mg at 12/02/16 2140  . dextrose 5 %-0.45 % sodium chloride infusion   Intravenous Continuous [name], Madie Reno, MD 10 mL/hr at 11/13/16 1009    . feeding supplement (GLUCERNA SHAKE) (GLUCERNA SHAKE) liquid 237 mL  237 mL Oral TID WC [name], [name], MD   237 mL at 12/03/16 1700  . fluvoxaMINE (LUVOX) tablet 100 mg  100 mg Oral QHS [name], [name], MD   100 mg at 12/02/16 2141  . glycopyrrolate (ROBINUL) injection 0.2 mg  0.2 mg Intravenous Once [name], [name], MD      . insulin aspart (novoLOG) injection 0-15 Units  0-15 Units Subcutaneous TID WC Loletha Grayer, MD   2 Units at 12/03/16 1633  . insulin aspart (novoLOG) injection 0-5 Units  0-5 Units Subcutaneous QHS Loletha Grayer, MD   2 Units at 12/02/16 2140  . insulin aspart (novoLOG) injection 10 Units  10 Units Subcutaneous TID WC Fritzi Mandes, MD   10 Units at 12/03/16 1634  . insulin glargine (LANTUS) injection 55 Units  55 Units Subcutaneous QHS Fritzi Mandes, MD   55 Units at 12/02/16 2139  . lisinopril  (PRINIVIL,ZESTRIL) tablet 20 mg  20 mg Oral Daily [name], Madie Reno, MD   20 mg at 12/03/16 0815  . magnesium hydroxide (MILK OF MAGNESIA) suspension 30 mL  30 mL Oral Daily PRN [name], [name], MD      . meloxicam (MOBIC) tablet 7.5 mg  7.5 mg Oral BID PC [name], [name], MD   7.5 mg at 12/03/16 1633  . metFORMIN (GLUCOPHAGE) tablet 1,000 mg  1,000 mg Oral BID WC [name], Madie Reno, MD   1,000 mg at 12/03/16 1633  . montelukast (SINGULAIR) tablet 10 mg  10 mg Oral QHS [name], Madie Reno, MD   10 mg at 12/02/16 2141  . nicotine (NICODERM CQ - dosed in mg/24 hr) patch 7 mg  7 mg Transdermal Daily Chauncey Mann, MD   7 mg at 12/01/16 0857  . pantoprazole (PROTONIX) EC tablet 40 mg  40 mg Oral Daily [name], [name], MD   40 mg at 12/03/16 0814  . polyethylene glycol (MIRALAX / GLYCOLAX) packet 17 g  17 g Oral Daily [name], [name], MD   17 g at 12/03/16 0800  . promethazine (PHENERGAN) tablet 12.5 mg  12.5 mg Oral Q6H PRN [name], [name], MD      . propranolol (INDERAL) tablet 20 mg  20 mg Oral BID [name], Madie Reno, MD   20 mg at 12/03/16 1633  . simvastatin (ZOCOR) tablet 40 mg  40 mg Oral q1800 [name], Madie Reno, MD   40 mg at 12/03/16 1633  . temazepam (RESTORIL) capsule 15 mg  15 mg Oral QHS [name], [name], MD   15 mg at 12/02/16 2141    Lab Results:  Results for orders placed or performed during the hospital encounter of 11/06/16 (from the past 48 hour(s))  Glucose, capillary     Status: Abnormal   Collection Time: 12/01/16  8:20 PM  Result Value Ref Range   Glucose-Capillary 206 (H) 65 - 99 mg/dL   Comment 1 Notify RN   Glucose, capillary     Status: Abnormal   Collection Time: 12/02/16  6:41 AM  Result Value Ref Range   Glucose-Capillary 157 (H) 65 - 99 mg/dL  Glucose, capillary     Status: Abnormal   Collection Time: 12/02/16 12:10 PM  Result Value Ref Range   Glucose-Capillary 148 (H) 65 - 99 mg/dL  Glucose, capillary     Status: Abnormal   Collection Time: 12/02/16  4:42 PM   Result Value Ref Range   Glucose-Capillary 270 (H) 65 - 99 mg/dL  Glucose, capillary     Status: Abnormal   Collection Time: 12/02/16  9:07 PM  Result Value Ref Range   Glucose-Capillary 240 (H) 65 - 99 mg/dL  Glucose, capillary     Status: Abnormal   Collection Time: 12/03/16  6:27 AM  Result Value Ref Range   Glucose-Capillary 249 (H) 65 - 99 mg/dL  Glucose, capillary     Status: Abnormal   Collection Time: 12/03/16 11:36 AM  Result Value Ref Range   Glucose-Capillary 257 (H) 65 - 99 mg/dL  Glucose, capillary     Status: Abnormal   Collection Time: 12/03/16  4:31 PM  Result Value Ref Range   Glucose-Capillary 133 (H) 65 - 99 mg/dL   Comment 1 Document in Chart     Blood Alcohol level:  Lab Results  Component Value Date   ETH <5 10/21/2016   ETH <5 93/26/7124    Metabolic Disorder Labs: Lab Results  Component Value Date  HGBA1C 10.3 (H) 11/09/2016   MPG 249 11/09/2016   MPG 275 10/09/2016   Lab Results  Component Value Date   PROLACTIN 24.5 (H) 09/24/2016   PROLACTIN 3.4 (L) 07/07/2016   Lab Results  Component Value Date   CHOL 92 11/09/2016   TRIG 54 11/09/2016   HDL 39 (L) 11/09/2016   CHOLHDL 2.4 11/09/2016   VLDL 11 11/09/2016   LDLCALC 42 11/09/2016   LDLCALC 72 10/09/2016    Physical Findings: AIMS: Facial and Oral Movements Muscles of Facial Expression: Mild Lips and Perioral Area: Mild Jaw: Minimal Tongue: Minimal,Extremity Movements Upper (arms, wrists, hands, fingers): None, normal Lower (legs, knees, ankles, toes): Mild, Trunk Movements Neck, shoulders, hips: Minimal, Overall Severity Severity of abnormal movements (highest score from questions above): Mild Incapacitation due to abnormal movements: None, normal Patient's awareness of abnormal movements (rate only patient's report): No Awareness, Dental Status Current problems with teeth and/or dentures?: No Does patient usually wear dentures?: No   Musculoskeletal: Strength & Muscle  Tone: within normal limits Gait & Station: normal Patient leans: [name]  Psychiatric Specialty Exam: Physical Exam  Nursing note and vitals reviewed. Constitutional: He appears well-developed and well-nourished.  HENT:  Head: Normocephalic and atraumatic.  Eyes: Pupils are equal, round, and reactive to light. Conjunctivae are normal.  Neck: Normal range of motion.  Cardiovascular: Regular rhythm and normal heart sounds.   Respiratory: Effort normal.  GI: Soft.  Musculoskeletal: Normal range of motion.  Neurological: He is alert.  Skin: Skin is warm and dry.  Psychiatric: Judgment normal. His affect is blunt. His speech is delayed. He is slowed. Thought content is not paranoid and not delusional. Cognition and memory are normal.    Review of Systems  Constitutional: Negative.   HENT: Negative.   Eyes: Negative.   Cardiovascular: Negative.   Gastrointestinal: Negative.   Genitourinary: Negative.   Musculoskeletal: Negative.   Skin: Negative.   Neurological: Negative.   Endo/Heme/Allergies: Negative.   Psychiatric/Behavioral: Negative for depression and hallucinations.    Blood pressure (!) 88/61, pulse 91, temperature 98.3 F (36.8 C), temperature source Oral, resp. rate 18, height '5\' 8"'$  (1.727 m), weight 81.2 kg (179 lb), SpO2 99 %.Body mass index is 27.22 kg/m.  General Appearance: Disheveled  Eye Contact:  Poor  Speech:  Slow  Volume:  Decreased  Mood:  Dysphoric  Affect:  Blunt  Thought Process:  Linear  Orientation:  Full (Time, Place, and Person)  Thought Content:  Paranoid Ideation  Suicidal Thoughts:  No  Homicidal Thoughts:  No  Memory:  Immediate;   Fair Recent;   Fair Remote;   Fair  Judgement:  Impaired  Insight:  Lacking  Psychomotor Activity:  Decreased  Concentration:  Concentration: Fair and Attention Span: Fair  Recall:  [employer] of Knowledge:  Fair  Language:  Good  Akathisia:  No  Handed:  Right  AIMS (if indicated):     Assets:   Housing Physical Health  ADL's:  Intact  Cognition:  WNL  Sleep:  Number of Hours: 7     Treatment Plan Summary:  Mr. On 41 year old male with prior diagnosis of schizoaffective disorder who presented to the emergency room with vague suicidal thoughts as well as paranoid thoughts. He does. Be responding to internal stimuli and has endorsed some auditory and visual hallucinations. He will be admitted to inpatient psychiatry for medication management, safety and stabilization.  1. Schizoaffective disorder, depressive type.   Continue Clozapine '100mg'$  at bedtime.  2.  ECT. Patient had ECT on Friday,11/13/2016 for the first time in this hospitalization.  Patient had 3 ECT treatments so far  3. Metabolic syndrome monitoring. Lipid panel is normal, hemoglobin A1c 103.  4. EKG. Normal sinus rhythm, QTc 417.  5. Questionable history of polysubstance abuse: The patient denies any history of any polysubstance use but records indicate that there are may have been a history of polysubstance use. He was advised to abstain from alcohol and all illicit drugs as it may worsen anxiety mood symptoms as well as psychosis. The patient is not interested in any substance abuse treatment.  6. Nicotine use disorder. Nicotine patch is available.   7. Diabetes. He is on Metformin, Novolog 10 units with meals and Lantus 55 units, along with ADA diet and blood sugar monitoring. Medicine input is greatly appreciated. Glucerna is discontinued.  8. Hypertension: Vital signs are stable. We'll continue lisinopril 20 mg daily.  9. Seasonal allergies: Continue Singulair 10 mg nightly  10. Anxiety/restlessness. He is on Propranolol.   11. Insomnia. We discontinued Trazodone and started Restoril. Slept 7.5 hours.  12. Back pain. We will give Robaxin.  13. Disposition: The patient will return to a group home. He will follow up with [facility] in [facility]. Consider ACT team.  Patient was better today than I think  I have ever seen him. I hope this is a real trend and not just a fluke. We will schedule ECT again tomorrow. I am very flexible about discharge planning if it helps to keep him well so that he can stay out of the hospital. No change to medicine for today.  Alethia Berthold, MD 12/03/2016, 6:17 PM

## 2016-12-03 NOTE — [facility] Group Notes (Signed)
[facility] LCSW Group Therapy Note  Date/Time: 12/03/16, 1300  Type of Therapy/Topic:  Group Therapy:  Balance in Life  Participation Level:  Pt did not attend group  Description of Group:    This group will address the concept of balance and how it feels and looks when one is unbalanced. Patients will be encouraged to process areas in their lives that are out of balance, and identify reasons for remaining unbalanced. Facilitators will guide patients utilizing problem- solving interventions to address and correct the stressor making their life unbalanced. Understanding and applying boundaries will be explored and addressed for obtaining  and maintaining a balanced life. Patients will be encouraged to explore ways to assertively make their unbalanced needs known to significant others in their lives, using other group members and facilitator for support and feedback.  Therapeutic Goals: 1. Patient will identify two or more emotions or situations they have that consume much of in their lives. 2. Patient will identify signs/triggers that life has become out of balance:  3. Patient will identify two ways to set boundaries in order to achieve balance in their lives:  4. Patient will demonstrate ability to communicate their needs through discussion and/or role plays  Summary of Patient Progress:          Therapeutic Modalities:   Cognitive Behavioral Therapy Solution-Focused Therapy Assertiveness Training  Daleen Squibb, LCSW

## 2016-12-03 NOTE — Plan of Care (Signed)
Problem: Coping: Goal: Ability to interact with others will improve Outcome: Progressing Patient interacting with staff more. Smiling and talking

## 2016-12-03 NOTE — Progress Notes (Signed)
Recreation Therapy Notes  Date: 07.12.18 Time: 9:30 am Location: Craft Room  Group Topic: Leisure Education  Goal Area(s) Addresses:  Patient will identify things they are grateful for. Patient will identify how being grateful can influence decision making.  Behavioral Response: Did not attend  Intervention: Grateful Wheel  Activity: Patients were given an I Am Grateful For worksheet and were instructed to write things they are grateful for under each category.  Education: LRT educated patients on leisure.  Education Outcome: Patient did not attend group.  Clinical Observations/Feedback: Patient did not attend group.  Jacquelynn Cree, LRT/CTRS 12/03/2016 10:05 AM

## 2016-12-03 NOTE — Progress Notes (Addendum)
Inpatient Diabetes Program Recommendations  AACE/ADA: New Consensus Statement on Inpatient Glycemic Control (2015)  Target Ranges:  Prepandial:   less than 140 mg/dL      Peak postprandial:   less than 180 mg/dL (1-2 hours)      Critically ill patients:  140 - 180 mg/dL   Lab Results  Component Value Date   GLUCAP 249 (H) 12/03/2016   HGBA1C 10.3 (H) 11/09/2016    Review of Glycemic Control  Results for CAPRI, RABEN (MRN [id number]) as of 12/03/2016 10:12  Ref. Range 12/02/2016 06:41 12/02/2016 12:10 12/02/2016 16:42 12/02/2016 21:07 12/03/2016 06:27  Glucose-Capillary Latest Ref Range: 65 - 99 mg/dL 194 (H) 174 (H) 081 (H) 240 (H) 249 (H)   Home DM Meds: Lantus 42 units QHS                             Novolog 5 units TID with meals                             Metformin 1000 mg BID  Current Insulin Orders: Lantus 55 units QHS                                       Novolog Moderate Correction Scale/ SSI (0-15 units) TID AC + HS                                       Novolog 10 units TID with meals                                       Metformin 1000 mg BID  MD- Please consider the following in-hospital insulin adjustments:  Increase Lantus to 58units and Novolog 12 tid with meals  Susette Racer, RN, [place], [place], CDE Diabetes Coordinator Inpatient Diabetes Program  [phone] (Team Pager) [phone] [facility] Office) 12/03/2016 10:12 AM

## 2016-12-04 ENCOUNTER — Inpatient Hospital Stay: Payer: Medicare Other | Admitting: Anesthesiology

## 2016-12-04 LAB — GLUCOSE, CAPILLARY
GLUCOSE-CAPILLARY: 174 mg/dL — AB (ref 65–99)
Glucose-Capillary: 200 mg/dL — ABNORMAL HIGH (ref 65–99)
Glucose-Capillary: 125 mg/dL — ABNORMAL HIGH (ref 65–99)
Glucose-Capillary: 160 mg/dL — ABNORMAL HIGH (ref 65–99)

## 2016-12-04 MED ORDER — MIDAZOLAM HCL 2 MG/2ML IJ SOLN
INTRAMUSCULAR | Status: DC | PRN
  Administered 2016-12-04: 2 mg via INTRAVENOUS

## 2016-12-04 MED ORDER — SODIUM CHLORIDE 0.9 % IV SOLN
500.0000 mL | Freq: Once | INTRAVENOUS | Status: AC
  Administered 2016-12-04: 1000 mL via INTRAVENOUS

## 2016-12-04 MED ORDER — SODIUM CHLORIDE 0.9 % IV SOLN
INTRAVENOUS | Status: DC | PRN
  Administered 2016-12-04: 10:00:00 via INTRAVENOUS

## 2016-12-04 MED ORDER — HALOPERIDOL LACTATE 5 MG/ML IJ SOLN
INTRAMUSCULAR | Status: DC | PRN
  Administered 2016-12-04: 5 mg via INTRAVENOUS

## 2016-12-04 MED ORDER — SUCCINYLCHOLINE CHLORIDE 200 MG/10ML IV SOSY
PREFILLED_SYRINGE | INTRAVENOUS | Status: DC | PRN
  Administered 2016-12-04: 100 mg via INTRAVENOUS

## 2016-12-04 MED ORDER — MIDAZOLAM HCL 2 MG/2ML IJ SOLN
INTRAMUSCULAR | Status: AC
  Filled 2016-12-04: qty 4

## 2016-12-04 MED ORDER — METHOHEXITAL SODIUM 100 MG/10ML IV SOSY
PREFILLED_SYRINGE | INTRAVENOUS | Status: DC | PRN
  Administered 2016-12-04: 80 mg via INTRAVENOUS

## 2016-12-04 NOTE — Progress Notes (Signed)
Patient ID: Tony Cunningham, male   DOB: 1975-12-21, 41 y.o.   MRN: [id number] ECT Prep Checklist: 1. Patient is NPO after midnight.  2. CBG level on the patient prior to his arrival to ECT = 200 3. Patient's insulin the morning of the procedure Held. 4. Please assist the patient with personal hygiene the morning of treatment including brushing his teeth 5. Propanolol will be given by Oncoming Nurse before he is transported for ECT  BP=105/59, PR=80 Sitting  BP=103/67, PR=84 Standing

## 2016-12-04 NOTE — Transfer of Care (Signed)
Immediate Anesthesia Transfer of Care Note  Patient: Tony Cunningham  Procedure(s) Performed: ECT  Patient Location: PACU  Anesthesia Type:General  Level of Consciousness: sedated  Airway & Oxygen Therapy: Patient Spontanous Breathing  Post-op Assessment: Report given to RN and Post -op Vital signs reviewed and stable  Post vital signs: Reviewed and stable  Last Vitals:  Vitals:   12/04/16 0928 12/04/16 1103  BP: 119/80 125/83  Pulse: 66 75  Resp: 18 (!) 24  Temp:  36.4 C    Last Pain:  Vitals:   12/04/16 1103  TempSrc:   PainSc: Asleep         Complications: No apparent anesthesia complications

## 2016-12-04 NOTE — Progress Notes (Signed)
Recreation Therapy Notes  Date: 07.13.18 Time: 10:00 am Location: Craft Room  Group Topic: Coping Skills  Goal Area(s) Addresses:  Patient will verbalize one emotion experienced in group. Patient will verbalize benefit of using art as a coping skill.  Behavioral Response: Did not attend   Intervention: Coloring  Activity: Patients were given coloring sheets to color and were instructed to think about the emotions they were feeling and what their minds were focused on.  Education: LRT educated patients on healthy coping skills.  Education Outcome: Patient did not attend group.  Clinical Observations/Feedback: Patient did not attend group.  Jacquelynn Cree, LRT/CTRS 12/04/2016 10:26 AM

## 2016-12-04 NOTE — Anesthesia Preprocedure Evaluation (Signed)
Anesthesia Evaluation  Patient identified by MRN, date of birth, ID band Patient awake    Reviewed: Allergy & Precautions, NPO status , Patient's Chart, lab work & pertinent test results  History of Anesthesia Complications Negative for: history of anesthetic complications  Airway Mallampati: II  TM Distance: >3 FB Neck ROM: Full    Dental  (+) Poor Dentition   Pulmonary asthma , Current Smoker,    breath sounds clear to auscultation- rhonchi (-) wheezing      Cardiovascular hypertension, Pt. on medications (-) CAD, (-) Past MI and (-) Cardiac Stents  Rhythm:Regular Rate:Normal - Systolic murmurs and - Diastolic murmurs    Neuro/Psych PSYCHIATRIC DISORDERS Anxiety Depression Schizophrenia negative neurological ROS     GI/Hepatic negative GI ROS, Neg liver ROS,   Endo/Other  diabetes, Insulin Dependent  Renal/GU negative Renal ROS     Musculoskeletal negative musculoskeletal ROS (+)   Abdominal (+) - obese,   Peds  Hematology negative hematology ROS (+)   Anesthesia Other Findings Past Medical History: No date: Anxiety No date: Asthma No date: Diabetes mellitus No date: High blood pressure No date: Sinus complaint   Reproductive/Obstetrics                             Anesthesia Physical  Anesthesia Plan  ASA: II  Anesthesia Plan: General   Post-op Pain Management:    Induction: Intravenous  PONV Risk Score and Plan: 0  Airway Management Planned: Mask  Additional Equipment:   Intra-op Plan:   Post-operative Plan:   Informed Consent: I have reviewed the patients History and Physical, chart, labs and discussed the procedure including the risks, benefits and alternatives for the proposed anesthesia with the patient or authorized representative who has indicated his/her understanding and acceptance.   Dental advisory given  Plan Discussed with: CRNA and  Anesthesiologist  Anesthesia Plan Comments:         Anesthesia Quick Evaluation  

## 2016-12-04 NOTE — H&P (Signed)
Tony Cunningham is an 41 y.o. male.   Chief Complaint: No specific new complaint. Patient has recurrent severe depression possible schizoaffective disorder HPI: History of severe depression and anxiety nonresponsive to medication  Past Medical History:  Diagnosis Date  . Anxiety   . Asthma   . Diabetes mellitus   . High blood pressure   . Sinus complaint     History reviewed. No pertinent surgical history.  History reviewed. No pertinent family history. Social History:  reports that he has been smoking Cigarettes.  He has been smoking about 0.50 packs per day. He has never used smokeless tobacco. He reports that he does not drink alcohol or use drugs.  Allergies: No Known Allergies  Medications Prior to Admission  Medication Sig Dispense Refill  . cloZAPine (CLOZARIL) 100 MG tablet Take 1 tablet (100 mg total) by mouth daily. Daily at 1700 30 tablet 0  . cyclobenzaprine (FLEXERIL) 10 MG tablet Take 1 tablet (10 mg total) by mouth 3 (three) times daily as needed for muscle spasms. 60 tablet 0  . fluvoxaMINE (LUVOX) 50 MG tablet Take 1 tablet (50 mg total) by mouth daily. Daily at 1700 30 tablet 0  . insulin aspart (NOVOLOG) 100 UNIT/ML injection Inject 5 Units into the skin 3 (three) times daily with meals. 5 mL 0  . insulin glargine (LANTUS) 100 UNIT/ML injection Inject 0.42 mLs (42 Units total) into the skin at bedtime. 13 mL 0  . ipratropium (ATROVENT) 0.06 % nasal spray Place 1 spray into both nostrils daily after supper. 3 mL 0  . lidocaine (LIDODERM) 5 % Place 1 patch onto the skin daily. Remove & Discard patch within 12 hours or as directed by MD 30 patch 0  . lisinopril (PRINIVIL,ZESTRIL) 20 MG tablet Take 1 tablet (20 mg total) by mouth daily. 30 tablet 0  . meloxicam (MOBIC) 7.5 MG tablet Take 1 tablet (7.5 mg total) by mouth 2 (two) times daily. 60 tablet 0  . metFORMIN (GLUCOPHAGE) 1000 MG tablet Take 1 tablet (1,000 mg total) by mouth 2 (two) times daily with a meal. 60 tablet  0  . montelukast (SINGULAIR) 10 MG tablet Take 1 tablet (10 mg total) by mouth at bedtime. For Asthma 30 tablet 0  . polyethylene glycol (MIRALAX / GLYCOLAX) packet Take 17 g by mouth daily. 30 each 0  . propranolol (INDERAL) 20 MG tablet Take 1 tablet (20 mg total) by mouth 2 (two) times daily. 60 tablet 0  . simvastatin (ZOCOR) 40 MG tablet Take 1 tablet (40 mg total) by mouth at bedtime. For high cholesterol 15 tablet 0    Results for orders placed or performed during the hospital encounter of 11/06/16 (from the past 48 hour(s))  Glucose, capillary     Status: Abnormal   Collection Time: 12/02/16 12:10 PM  Result Value Ref Range   Glucose-Capillary 148 (H) 65 - 99 mg/dL  Glucose, capillary     Status: Abnormal   Collection Time: 12/02/16  4:42 PM  Result Value Ref Range   Glucose-Capillary 270 (H) 65 - 99 mg/dL  Glucose, capillary     Status: Abnormal   Collection Time: 12/02/16  9:07 PM  Result Value Ref Range   Glucose-Capillary 240 (H) 65 - 99 mg/dL  Glucose, capillary     Status: Abnormal   Collection Time: 12/03/16  6:27 AM  Result Value Ref Range   Glucose-Capillary 249 (H) 65 - 99 mg/dL  Glucose, capillary     Status: Abnormal  Collection Time: 12/03/16 11:36 AM  Result Value Ref Range   Glucose-Capillary 257 (H) 65 - 99 mg/dL  Glucose, capillary     Status: Abnormal   Collection Time: 12/03/16  4:31 PM  Result Value Ref Range   Glucose-Capillary 133 (H) 65 - 99 mg/dL   Comment 1 Document in Chart   Glucose, capillary     Status: Abnormal   Collection Time: 12/03/16  8:41 PM  Result Value Ref Range   Glucose-Capillary 225 (H) 65 - 99 mg/dL  Glucose, capillary     Status: Abnormal   Collection Time: 12/04/16  6:47 AM  Result Value Ref Range   Glucose-Capillary 200 (H) 65 - 99 mg/dL   No results found.  Review of Systems  Constitutional: Negative.   HENT: Negative.   Eyes: Negative.   Respiratory: Negative.   Cardiovascular: Negative.   Gastrointestinal:  Negative.   Musculoskeletal: Negative.   Skin: Negative.   Neurological: Negative.   Psychiatric/Behavioral: Positive for memory loss. Negative for depression, hallucinations, substance abuse and suicidal ideas. The patient is nervous/anxious. The patient does not have insomnia.     Blood pressure 119/80, pulse 66, temperature 97.9 F (36.6 C), resp. rate 18, height 5\' 8"  (1.727 m), weight 180 lb (81.6 kg), SpO2 100 %. Physical Exam  Nursing note and vitals reviewed. Constitutional: He appears well-developed and well-nourished.  HENT:  Head: Normocephalic and atraumatic.  Eyes: Pupils are equal, round, and reactive to light. Conjunctivae are normal.  Neck: Normal range of motion.  Cardiovascular: Regular rhythm and normal heart sounds.   Respiratory: Effort normal. No respiratory distress.  GI: Soft.  Musculoskeletal: Normal range of motion.  Neurological: He is alert.  Skin: Skin is warm and dry.  Psychiatric: Judgment normal. His affect is blunt. His speech is delayed. He is slowed. Thought content is not paranoid. He exhibits a depressed mood. He expresses no homicidal and no suicidal ideation. He exhibits abnormal recent memory.     Assessment/Plan Treatment today. He clearly seems to be doing better at this point. Continue into next week and we may be getting near the end of the index course  , MD 12/04/2016, 10:46 AM

## 2016-12-04 NOTE — Progress Notes (Signed)
Inpatient Diabetes Program Recommendations  AACE/ADA: New Consensus Statement on Inpatient Glycemic Control (2015)  Target Ranges:  Prepandial:   less than 140 mg/dL      Peak postprandial:   less than 180 mg/dL (1-2 hours)      Critically ill patients:  140 - 180 mg/dL   Lab Results  Component Value Date   GLUCAP 200 (H) 12/04/2016   HGBA1C 10.3 (H) 11/09/2016    Review of Glycemic Control   Results for NYLES, MITTON (MRN [id number]) as of 12/04/2016 10:51  Ref. Range 12/03/2016 06:27 12/03/2016 11:36 12/03/2016 16:31 12/03/2016 20:41 12/04/2016 06:47  Glucose-Capillary Latest Ref Range: 65 - 99 mg/dL 194 (H) 174 (H) 081 (H) 225 (H) 200 (H)     Home DM Meds: Lantus 42 units QHS                             Novolog 5 units TID with meals                             Metformin 1000 mg BID  Current Insulin Orders: Lantus 55 units QHS                                       Novolog Moderate Correction Scale/ SSI (0-15 units) TID AC + HS                                       Novolog 10 units TID with meals                                       Metformin 1000 mg BID  Please consider; Increase Lantus to 58units and Novolog 12 tid with meals  Correction insulin (sliding scale) is based on a blood sugar reading and must be given with 1 hour of the blood sugar being checked.  If you are not going to give the sliding scale insulin at 6:30am when the blood sugar is checked, you will need to recheck the blood sugar BEFORE the patient eats their breakfast in order to give the correct dose (and follow protocol)  Susette Racer, RN, BA, MHA, CDE Diabetes Coordinator Inpatient Diabetes Program  [phone] (Team Pager) [phone] [facility] Office) 12/04/2016 10:54 AM

## 2016-12-04 NOTE — Progress Notes (Signed)
Patient ID: Tony Cunningham, male   DOB: [birth year], 41 y.o.   MRN: [id number] Visible, interacting well with others, disheveled in streets clothes, speech clear, organized, no delayed response, low volume of voice; thoughts are logical and coherent; denied SI/HI/AVH; no temper tantrums; CBG @ bedtime = 225. ECT prepared: NPO past MN. Sister, Ezequiel Essex, called from [place] around 2200, wondered why he was not discharged to his mother on Wednesday.

## 2016-12-04 NOTE — Procedures (Signed)
ECT SERVICES Physician's Interval Evaluation & Treatment Note  Patient Identification: Tony Cunningham MRN:  [id number] Date of Evaluation:  12/04/2016 TX #: [id number]  MADRS:   MMSE:   P.E. Findings:  No change to physical exam  Psychiatric Interval Note:  Mood is still a little blunted and depressed but seems improving  Subjective:  Patient is a 41 y.o. male seen for evaluation for Electroconvulsive Therapy. No specific complaint  Treatment Summary:   []   Right Unilateral             [x]  Bilateral   % Energy : 1.0 ms 60%   Impedance: 1860 ohms  Seizure Energy Index: 1060 V squared  Postictal Suppression Index: 76%  Seizure Concordance Index: 83%  Medications  Pre Shock: Brevital 80 mg succinylcholine 100 mg  Post Shock: Versed 4 mg Haldol 5 mg  Seizure Duration: 28 seconds by EMG 32 seconds by EEG   Comments: Follow-up in Monday   Lungs:  [x]   Clear to auscultation               []  Other:   Heart:    [x]   Regular rhythm             []  irregular rhythm    [x]   Previous H&P reviewed, patient examined and there are NO CHANGES                 []   Previous H&P reviewed, patient examined and there are changes noted.   , MD 7/13/201810:48 AM

## 2016-12-04 NOTE — Anesthesia Procedure Notes (Signed)
Date/Time: 12/04/2016 10:54 AM Performed by: Lily Kocher Pre-anesthesia Checklist: Patient identified, Emergency Drugs available, Suction available and Patient being monitored Patient Re-evaluated:Patient Re-evaluated prior to induction Oxygen Delivery Method: Circle system utilized Preoxygenation: Pre-oxygenation with 100% oxygen Induction Type: IV induction Ventilation: Mask ventilation without difficulty and Mask ventilation throughout procedure Airway Equipment and Method: Bite block Placement Confirmation: positive ETCO2 Dental Injury: Teeth and Oropharynx as per pre-operative assessment

## 2016-12-04 NOTE — Plan of Care (Signed)
Problem: Activity: Goal: Sleeping patterns will improve Outcome: Progressing Patient slept for Estimated Hours of 6.15; Precautionary checks every 15 minutes for safety maintained, room free of safety hazards, patient sustains no injury or falls during this shift.    

## 2016-12-04 NOTE — Progress Notes (Signed)
[facility] MD Progress Note  12/04/2016 7:52 PM Praneel Haisley  MRN:  [id number]  Subjective:  Mr. Lance is a 41 year old male with prior diagnosis of schizoaffective disorder. He received ECT to be continued next week if appropriate.  He was started on Clozapine and his dose was titrated to 225 mg. He is also on Luvox. Clozapine level was quite high with resulting sedation, because of the combination. Unfortunately in ERROR his Clozapine was discotoniued after Monday. I restarted 100 mg of Clozapine tonight. Actually, he is doing well without it, less sedated. Not knowing he was off Clozapine I took another level Thursday night.  11/08/16. The patient has been very isolative and withdrawn. Hygiene is poor and he is malodorous. He has not showered. He did go outside with the other patients and has been getting up for meals but not attending groups or interacting with staff or peers. He denies any current active or passive suicidal thoughts but does admit to some very vague paranoid thoughts that others are watching him. He is very limited and talking with this Probation officer and wanted to be "left alone to sleep". He denied any current somatic complaints. Vital signs are stable and he slept fairly well last night but is also sleeping partially during the daytime. So far, he is tolerating the Clozaril fairly well without any physical adverse side effects.  Follow-up for this 41 year old man with what appears to be psychotic depression or schizoaffective disorder. Patient remains in bed most of the time. Poorly interactive. Mood is depressed. Passive suicidal thoughts.  Follow-up for Thursday the 21st. Patient remains withdrawn very little activity. Blood sugars are also running much higher. Patient continues to endorse feeling depressed and having vague suicidal thoughts.  Follow-up for Friday the 22nd. Patient finally had ECT this morning bilateral treatment which went off without any difficulty or complication. I saw him  this afternoon and I think he actually was a little more interactive and verbal and energetic than before. This may be wishful thinking but it makes me optimistic. No specific complaints other than headache  Patient was seen today for follow-up. Patient has no new complaints. Continues to say his thoughts are racing but his affect is flat and withdrawn he is almost completely withdrawn from other people and stays in his room by himself. Suicidal thoughts passive no intention or plan. Still has psychotic symptoms. Blood sugars unfortunately continued to run quite high.  6/23 patient denies having any issues or concerns today. Says that he has been eating and sleeping well. Denies side effects from medications or any physical complaints. Denies suicidality or homicidality. He says that he might be hallucinating but he is not sure. He said that he had 1 ECT procedure and it went well.   Blood glucose this morning was 92. Patient is compliant with medications. No major events overnight per nursing.  6/24 patient did not sleep well last night. Other than that he denies having any issues or concerns. He denies suicidality, homicidality or auditory visual hallucinations. During examination he appears much brighter and more conversational. His thought processes are linear.  11/16/2016. Mr. Edmonston does not like talking to me today. He is in his room he is in bed He does not open his eyes for me and is unable to keep a conversation he keeps telling me okay okay and is unwilling to answer any questions. He received just one ECT treatment last week. Since Dr. Weber Cooks is on vacation, ECT will resume next week.  He  accepts medications and seems to tolerate them well Clozaril level is over 700. This is probably from a combination of clozapine and Luvox. I will alert dose of clozapine to 200 mg  To avoid side effects. I will switch to Luvox to 1 nightly dose to improve sleep.  11/17/2016. [name] is much more animated and  interactive today. He is in his room in bed bugs is able to hold a conversation and maintains good eye contact. He complains of stomach ache. He did have a bowel movement this morning. His mood is still depressed and he needs much encouragement to attend to his ADLs. Staff needs to strongly encourage showering. He has high hopes for ECT and will not stay in the hospital until next week to resume ECT with Dr. Weber Cooks Is back from vacation. Yesterday he is agreeable to the nighttime and clozapine dose decrease due to high clozapine level. This could have helped the patient stated more awake this morning. He slept almost 6 hours.  11/18/2016. [name] seems more relaxed and talkative today. He is still very flat on his back but opens his eyes for me to we are having a small conversation. He has not been sleeping well. There are in spite of medication adjustments. He believes that trazodone works well for him. I will start trazodone tonight. She no longer complains of stomach pain. He does not participate in programming but takes medications with no problem. She met with the group home representative this week and was accepted to a group home with a bed open at the beginning of July. When asked returns from vacation, he will decide whether to continue ECT or whether the patient will be discharged.  11/19/2016. [name] seems better today. This morning he has been up and walking around. He complains of "tension" in his head both mental and physical. He slept 3 hours only again in spite of multiple sedating medications given at night including Trazodone. I will switch him to Restoril tonight. It may need to be discontinued if her restarts ECT next week. No other somatic complaints. Sugers still elevated at times. I changed diet to ADA and discontinued Glucerna. Input from diabetes nurse coordinator and medicine consultant is greatly appreciated. He does not interact with staff/peers or participate in programing. Hygiene  is still questionable.  11/20/2016. [name] is up and about today. He comes to my office for interview. He seems more relaxed today. Still very vague in his answers. Complains of back and neck pain but has been in bed on his back most of the week. His sugar was low today as he was switched to ADA diet and Glucerna was discontinued upon [facility] suggestion. He is looking forward to see Dr. Weber Cooks "who is the best". He corrects himself to tell me that I am not bad either.  Follow-up for June 30. Patient seen. Spoke with nursing. Patient continues to report feeling bad. Mood feels depressed. Denies acute suicidal thoughts but feels hopeless about the future. Feels paranoid and vaguely report psychosis. Patient continues to have poor self-care and poor interaction with others on the unit. Physically he seems to be stabilizing a little.  Follow-up Sunday, July 1. Patient seen. He looks a little better groomed today. He has been up out of his bed although he does not attend any groups. He is ambivalent about his mood today. Denies acute hallucinations or suicidal thinking. Still feels hopeless about discharge.  Patient seen for follow-up on Monday, July 2. He  had ECT treatment this morning which was complicated by some delirium and agitation during the recovery phase. Otherwise treatment went fine. Seen this afternoon the patient is still a little groggy from the medicines he received. No specific physical complaints however. Mood is still depressed and he is still slow and cognitively impaired. Denies acute suicidal intent. Sugars continue to be very labile. He is requesting a restart of his Glucerna but I am unsure whether that is appropriate.  [place] Tuesday, July 3. Patient has no new complaints. In fact today he seems to think he is doing a little bit better. His affect looked brighter and he was more conversant with me. He is taking some steps to improve his appearance.  Follow-up for Wednesday, July 4.  Patient seen. His only complaint today is wanting to have the dietary supplement Glucerna shake back. He thinks his mood is feeling better. He lets me know that he has been out of bed watching TV and being around people. Hallucinations are minimal. Denies suicidal thoughts. Admits that he is feeling a little more optimistic about going home with his family.  Follow-up for Thursday, July 5. Patient says he is feeling a little better than yesterday. Still feels anxious much of the time. He has made the effort to go to groups a little bit. Thought still are scattered and a little disorganized. Stays withdrawn most of the time. Denies however having active suicidal thoughts. Sugars continue to be labile but nothing terrible  Follow-up for Friday the sixth. Patient had ECT today which was tolerated well although he still has some agitation during recovery. Overall however his mood is improving. He is more interactive makes better eye contact and has managed to get up and be around other patients better. Denies any suicidal thoughts currently. Still feels confused and gets very nervous but denies he's having hallucinations.  11/28/2016- Patient continues to report being depressed. Has poor hygiene. Patient with strong malodor. He has not been attending any programming. Denies suicidal thoughts. Patient had 3 ECT treatments so far.  11/29/2016 Patient sleeping in his room. Continues to just lie in his bed, not attending to his hygiene despite his nurse coaxing him. His room has been cleaned and all sheets changed.   Follow-up for Monday, July 9. Patient had ECT treatment this morning. Bilateral treatment tolerated well recovered without difficulty. Seen this afternoon he is a little bit tired but says that overall he thinks he is getting better. Still stays pretty isolated. Hygiene fortunately has improved over the weekend.  Follow-up for Tuesday, July 10. Patient seen this afternoon. Unfortunately he is still  in bed. It is rare for me to ever catch him out of bed or interacting. He claims that he has gone to some groups however. He says his mood is feeling better and he denies any hallucinations. He does admit to still feeling anxious and worried about how he is going to function when he goes home to stay with his mother. Blood sugars are actually looking a little bit more stable thank you very much to the hospitalists.  Follow-up note for Thursday the 12th. Patient seen. He actually was up watching television this evening. He smiled and said that he was feeling much better. Seems to be actually starting to show some serious improvement. Denies having any hallucinations or suicidal thoughts today. We discussed the possibility of discharge however and he is hesitant because he wants to be as good as possible when he is discharged. Sugars are  staying reasonably good given his history Follow-up for Friday the 13th. Patient had bilateral ECT today without complication. This afternoon he reports he is actually feeling pretty good. Once again he is up beat and interacting with others and smiling.  Per nursing: Pt calm, cooperative and pleasant.  AM BS 133.  Speech is more clear and logical, though remains hypoverbal.  Remains frequently isolative to room.  No behavioral issues.     Principal Problem: Schizoaffective disorder, depressive type ([place]) Diagnosis:   Patient Active Problem List   Diagnosis Date Noted  . Schizoaffective disorder, depressive type ([place]) [F25.1] 09/23/2016  . Polysubstance abuse [F19.10] 07/17/2016  . Tardive dyskinesia [G24.01] 07/16/2016  . Tobacco use disorder [F17.200] 07/07/2016  . Dyslipidemia [E78.5] 07/07/2016  . Asthma [J45.909] 07/07/2016  . HTN (hypertension) [I10] 07/06/2016  . Diabetes ([place]) [E11.9] 12/25/2010   Total Time spent with patient: 20 minutes    Past Medical History:  Past Medical History:  Diagnosis Date  . Anxiety   . Asthma   . Diabetes mellitus    . High blood pressure   . Sinus complaint    History reviewed. No pertinent surgical history.  Social History:  History  Alcohol Use No     History  Drug Use No    Social History   Social History  . Marital status: Single    Spouse name: [name]  . Number of children: [name]  . Years of education: [name]   Social History Main Topics  . Smoking status: Current Every Day Smoker    Packs/day: 0.50    Types: Cigarettes  . Smokeless tobacco: Never Used  . Alcohol use No  . Drug use: No  . Sexual activity: Not Currently   Other Topics Concern  . None   Social History Narrative  . None   Additional Social History:                         Sleep: Good  Appetite:  Good  Current Medications: Current Facility-Administered Medications  Medication Dose Route Frequency Provider Last Rate Last Dose  . acetaminophen (TYLENOL) tablet 650 mg  650 mg Oral Q6H PRN [name], Madie Reno, MD   650 mg at 11/19/16 1739  . alum & mag hydroxide-simeth (MAALOX/MYLANTA) 200-200-20 MG/5ML suspension 30 mL  30 mL Oral Q4H PRN [name], [name], MD      . cloZAPine (CLOZARIL) tablet 100 mg  100 mg Oral QHS [name], [name], MD   100 mg at 12/03/16 2111  . dextrose 5 %-0.45 % sodium chloride infusion   Intravenous Continuous [name], Madie Reno, MD 10 mL/hr at 11/13/16 1009    . feeding supplement (GLUCERNA SHAKE) (GLUCERNA SHAKE) liquid 237 mL  237 mL Oral TID WC [name], [name], MD   237 mL at 12/04/16 1701  . fluvoxaMINE (LUVOX) tablet 100 mg  100 mg Oral QHS [name], [name], MD   100 mg at 12/03/16 2108  . glycopyrrolate (ROBINUL) injection 0.2 mg  0.2 mg Intravenous Once [name], [name], MD      . insulin aspart (novoLOG) injection 0-15 Units  0-15 Units Subcutaneous TID WC Loletha Grayer, MD   3 Units at 12/04/16 1631  . insulin aspart (novoLOG) injection 0-5 Units  0-5 Units Subcutaneous QHS Loletha Grayer, MD   3 Units at 12/03/16 2112  . insulin aspart (novoLOG) injection 10 Units   10 Units Subcutaneous TID WC Fritzi Mandes, MD   10 Units  at 12/04/16 1630  . insulin glargine (LANTUS) injection 55 Units  55 Units Subcutaneous QHS Fritzi Mandes, MD   55 Units at 12/03/16 2106  . lisinopril (PRINIVIL,ZESTRIL) tablet 20 mg  20 mg Oral Daily [name], Madie Reno, MD   20 mg at 12/03/16 0815  . magnesium hydroxide (MILK OF MAGNESIA) suspension 30 mL  30 mL Oral Daily PRN [name], [name], MD      . meloxicam (MOBIC) tablet 7.5 mg  7.5 mg Oral BID PC [name], [name], MD   7.5 mg at 12/04/16 1700  . metFORMIN (GLUCOPHAGE) tablet 1,000 mg  1,000 mg Oral BID WC [name], [name], MD   1,000 mg at 12/04/16 1701  . montelukast (SINGULAIR) tablet 10 mg  10 mg Oral QHS [name], Madie Reno, MD   10 mg at 12/03/16 2108  . nicotine (NICODERM CQ - dosed in mg/24 hr) patch 7 mg  7 mg Transdermal Daily Chauncey Mann, MD   7 mg at 12/01/16 0857  . pantoprazole (PROTONIX) EC tablet 40 mg  40 mg Oral Daily [name], [name], MD   40 mg at 12/04/16 0732  . polyethylene glycol (MIRALAX / GLYCOLAX) packet 17 g  17 g Oral Daily [name], [name], MD   17 g at 12/03/16 0800  . promethazine (PHENERGAN) tablet 12.5 mg  12.5 mg Oral Q6H PRN [name], [name], MD      . propranolol (INDERAL) tablet 20 mg  20 mg Oral BID [name], Madie Reno, MD   20 mg at 12/04/16 1700  . simvastatin (ZOCOR) tablet 40 mg  40 mg Oral q1800 [name], Madie Reno, MD   40 mg at 12/04/16 1700  . temazepam (RESTORIL) capsule 15 mg  15 mg Oral QHS [name], [name], MD   15 mg at 12/03/16 2106    Lab Results:  Results for orders placed or performed during the hospital encounter of 11/06/16 (from the past 48 hour(s))  Glucose, capillary     Status: Abnormal   Collection Time: 12/02/16  9:07 PM  Result Value Ref Range   Glucose-Capillary 240 (H) 65 - 99 mg/dL  Glucose, capillary     Status: Abnormal   Collection Time: 12/03/16  6:27 AM  Result Value Ref Range   Glucose-Capillary 249 (H) 65 - 99 mg/dL  Glucose, capillary     Status: Abnormal    Collection Time: 12/03/16 11:36 AM  Result Value Ref Range   Glucose-Capillary 257 (H) 65 - 99 mg/dL  Glucose, capillary     Status: Abnormal   Collection Time: 12/03/16  4:31 PM  Result Value Ref Range   Glucose-Capillary 133 (H) 65 - 99 mg/dL   Comment 1 Document in Chart   Glucose, capillary     Status: Abnormal   Collection Time: 12/03/16  8:41 PM  Result Value Ref Range   Glucose-Capillary 225 (H) 65 - 99 mg/dL  Glucose, capillary     Status: Abnormal   Collection Time: 12/04/16  6:47 AM  Result Value Ref Range   Glucose-Capillary 200 (H) 65 - 99 mg/dL  Glucose, capillary     Status: Abnormal   Collection Time: 12/04/16 11:47 AM  Result Value Ref Range   Glucose-Capillary 125 (H) 65 - 99 mg/dL   Comment 1 Notify RN   Glucose, capillary     Status: Abnormal   Collection Time: 12/04/16  4:29 PM  Result Value Ref Range   Glucose-Capillary 174 (H) 65 - 99 mg/dL  Blood Alcohol level:  Lab Results  Component Value Date   [facility] <5 10/21/2016   ETH <5 62/83/1517    Metabolic Disorder Labs: Lab Results  Component Value Date   HGBA1C 10.3 (H) 11/09/2016   MPG 249 11/09/2016   MPG 275 10/09/2016   Lab Results  Component Value Date   PROLACTIN 24.5 (H) 09/24/2016   PROLACTIN 3.4 (L) 07/07/2016   Lab Results  Component Value Date   CHOL 92 11/09/2016   TRIG 54 11/09/2016   HDL 39 (L) 11/09/2016   CHOLHDL 2.4 11/09/2016   VLDL 11 11/09/2016   LDLCALC 42 11/09/2016   LDLCALC 72 10/09/2016    Physical Findings: AIMS: Facial and Oral Movements Muscles of Facial Expression: Mild Lips and Perioral Area: Mild Jaw: Minimal Tongue: Minimal,Extremity Movements Upper (arms, wrists, hands, fingers): None, normal Lower (legs, knees, ankles, toes): Mild, Trunk Movements Neck, shoulders, hips: Minimal, Overall Severity Severity of abnormal movements (highest score from questions above): Mild Incapacitation due to abnormal movements: None, normal Patient's awareness of  abnormal movements (rate only patient's report): No Awareness, Dental Status Current problems with teeth and/or dentures?: No Does patient usually wear dentures?: No   Musculoskeletal: Strength & Muscle Tone: within normal limits Gait & Station: normal Patient leans: [name]  Psychiatric Specialty Exam: Physical Exam  Nursing note and vitals reviewed. Constitutional: He appears well-developed and well-nourished.  HENT:  Head: Normocephalic and atraumatic.  Eyes: Pupils are equal, round, and reactive to light. Conjunctivae are normal.  Neck: Normal range of motion.  Cardiovascular: Regular rhythm and normal heart sounds.   Respiratory: Effort normal.  GI: Soft.  Musculoskeletal: Normal range of motion.  Neurological: He is alert.  Skin: Skin is warm and dry.  Psychiatric: Judgment normal. His affect is not blunt. His speech is delayed. He is slowed. Thought content is not paranoid and not delusional. Cognition and memory are normal.    Review of Systems  Constitutional: Negative.   HENT: Negative.   Eyes: Negative.   Cardiovascular: Negative.   Gastrointestinal: Negative.   Genitourinary: Negative.   Musculoskeletal: Negative.   Skin: Negative.   Neurological: Negative.   Endo/Heme/Allergies: Negative.   Psychiatric/Behavioral: Negative for depression and hallucinations.    Blood pressure 95/62, pulse 69, temperature 98.5 F (36.9 C), temperature source Oral, resp. rate 18, height '5\' 8"'$  (1.727 m), weight 180 lb (81.6 kg), SpO2 95 %.Body mass index is 27.37 kg/m.  General Appearance: Disheveled  Eye Contact:  Poor  Speech:  Slow  Volume:  Decreased  Mood:  Dysphoric  Affect:  Blunt  Thought Process:  Linear  Orientation:  Full (Time, Place, and Person)  Thought Content:  Paranoid Ideation  Suicidal Thoughts:  No  Homicidal Thoughts:  No  Memory:  Immediate;   Fair Recent;   Fair Remote;   Fair  Judgement:  Impaired  Insight:  Lacking  Psychomotor Activity:   Decreased  Concentration:  Concentration: Fair and Attention Span: Fair  Recall:  [employer] of Knowledge:  Fair  Language:  Good  Akathisia:  No  Handed:  Right  AIMS (if indicated):     Assets:  Housing Physical Health  ADL's:  Intact  Cognition:  WNL  Sleep:  Number of Hours: 6.15     Treatment Plan Summary:  Mr. Bergdoll 42 year old male with prior diagnosis of schizoaffective disorder who presented to the emergency room with vague suicidal thoughts as well as paranoid thoughts. He does. Be responding to internal stimuli and has  endorsed some auditory and visual hallucinations. He will be admitted to inpatient psychiatry for medication management, safety and stabilization.  1. Schizoaffective disorder, depressive type.   Continue Clozapine '100mg'$  at bedtime.  2. ECT. Patient had ECT on Friday,11/13/2016 for the first time in this hospitalization.  Patient had 3 ECT treatments so far  3. Metabolic syndrome monitoring. Lipid panel is normal, hemoglobin A1c 103.  4. EKG. Normal sinus rhythm, QTc 417.  5. Questionable history of polysubstance abuse: The patient denies any history of any polysubstance use but records indicate that there are may have been a history of polysubstance use. He was advised to abstain from alcohol and all illicit drugs as it may worsen anxiety mood symptoms as well as psychosis. The patient is not interested in any substance abuse treatment.  6. Nicotine use disorder. Nicotine patch is available.   7. Diabetes. He is on Metformin, Novolog 10 units with meals and Lantus 55 units, along with ADA diet and blood sugar monitoring. Medicine input is greatly appreciated. Glucerna is discontinued.  8. Hypertension: Vital signs are stable. We'll continue lisinopril 20 mg daily.  9. Seasonal allergies: Continue Singulair 10 mg nightly  10. Anxiety/restlessness. He is on Propranolol.   11. Insomnia. We discontinued Trazodone and started Restoril. Slept 7.5  hours.  12. Back pain. We will give Robaxin.  13. Disposition: The patient will return to a group home. He will follow up with [facility] in [facility]. Consider ACT team.  Clearly doing better. Still expresses anxiety about going home. Encouraged him to contact his family continue going to groups and be social. No change to medicine. ECT again on Monday but I am then hoping for discharge by Monday or at latest Tuesday. Alethia Berthold, MD 12/04/2016, 7:52 PM

## 2016-12-04 NOTE — Progress Notes (Signed)
Patient ID: Tony Cunningham, male   DOB: [birth year], 41 y.o.   MRN: [id number]   CSW spoke with Dr. Toni Amend, patients assigned MD. Informed CSW patient will not be discharging today and that the discharge date is tentatively set for 12/07/2016. CSW contacted patient's sister-in-law [name] to inform her that patient was no longer being discharged today. CSW left voicemail informing her of the change and asking for a returned call.  Serena Petterson G. Garnette Czech MSW, [facility] 12/04/2016 8:57 AM

## 2016-12-04 NOTE — [facility] Group Notes (Signed)
[facility] LCSW Group Therapy Note  Date/Time: 12/04/16, 1300  Type of Therapy and Topic:  Group Therapy:  Feelings around Relapse and Recovery  Participation Level:  Did Not Attend   Mood:  Description of Group:    Patients in this group will discuss emotions they experience before and after a relapse. They will process how experiencing these feelings, or avoidance of experiencing them, relates to having a relapse. Facilitator will guide patients to explore emotions they have related to recovery. Patients will be encouraged to process which emotions are more powerful. They will be guided to discuss the emotional reaction significant others in their lives may have to patients' relapse or recovery. Patients will be assisted in exploring ways to respond to the emotions of others without this contributing to a relapse.  Therapeutic Goals: 1. Patient will identify two or more emotions that lead to relapse for them:  2. Patient will identify two emotions that result when they relapse:  3. Patient will identify two emotions related to recovery:  4. Patient will demonstrate ability to communicate their needs through discussion and/or role plays.   Summary of Patient Progress:     Therapeutic Modalities:   Cognitive Behavioral Therapy Solution-Focused Therapy Assertiveness Training Relapse Prevention Therapy  Daleen Squibb, LCSW

## 2016-12-04 NOTE — Plan of Care (Signed)
Problem: Safety: Goal: Periods of time without injury will increase Outcome: Progressing Patient remains safe and without injury during hospitalization and on Q 15 minute observation. Will continue to monitor patient.    

## 2016-12-04 NOTE — Anesthesia Postprocedure Evaluation (Signed)
Anesthesia Post Note  Patient: Tony Cunningham  Procedure(s) Performed: * No procedures listed *  Patient location during evaluation: PACU Anesthesia Type: General Level of consciousness: awake and alert Pain management: pain level controlled Vital Signs Assessment: post-procedure vital signs reviewed and stable Respiratory status: spontaneous breathing, nonlabored ventilation, respiratory function stable and patient connected to nasal cannula oxygen Cardiovascular status: blood pressure returned to baseline and stable Postop Assessment: no signs of nausea or vomiting Anesthetic complications: no     Last Vitals:  Vitals:   12/04/16 1116 12/04/16 1128  BP:  124/88  Pulse: 73 73  Resp: 16 (!) 22  Temp:      Last Pain:  Vitals:   12/04/16 1103  TempSrc:   PainSc: Asleep                 Lenard Simmer

## 2016-12-04 NOTE — Anesthesia Post-op Follow-up Note (Cosign Needed)
Anesthesia QCDR form completed.        

## 2016-12-04 NOTE — [facility] Group Notes (Signed)
[facility] Group Notes:  (Nursing/MHT/Case Management/Adjunct)  Date:  12/04/2016  Time:  11:25 PM  Type of Therapy:  Group Therapy  Participation Level:  Active  Participation Quality:  Appropriate  Affect:  Appropriate  Cognitive:  Appropriate  Insight:  Good  Engagement in Group:  Engaged  Modes of Intervention:  Support  Summary of Progress/Problems:  Tony Cunningham 12/04/2016, 11:25 PM

## 2016-12-04 NOTE — Progress Notes (Signed)
Patient went to ECT this morning. Patient ate lunch upon return and afterwards went back to bed to lay down. Patient denies any SI/HI/AH/VH. Later on the day patient came out of room and went outside for fresh air. Patient is alert and oriented x 4, breathing unlabored, and extremities x 4 within normal limits. Patient is calm, cooperative, and compliant with medications. Patient did not display any disruptive behavior. Patient interacted with staff and peers in dayroom and hallway. Patient continues to be monitored on 15 minute safety checks. Will continue to monitor patient and notify MD of any changes.

## 2016-12-05 LAB — GLUCOSE, CAPILLARY
GLUCOSE-CAPILLARY: 199 mg/dL — AB (ref 65–99)
Glucose-Capillary: 88 mg/dL (ref 65–99)
Glucose-Capillary: 302 mg/dL — ABNORMAL HIGH (ref 65–99)
Glucose-Capillary: 313 mg/dL — ABNORMAL HIGH (ref 65–99)

## 2016-12-05 NOTE — Progress Notes (Signed)
Patient attended group this shift . Able to speak with his dad whom lives in [place]  Patient remains to show any thoughts of cleaning himself up. Clothing unclean .Patient has a odor.  Compliant with medication . Isolates to room during shift.  Appetite good  Answers  With one word Patient stated slept good last night . Denies suicidal  homicidal ideations  . No pain concerns . Appropriate ADL'S.Limiting  Interacting with peers and staff.  A: Encourage patient participation with unit programming . Instruction  Given on  Medication , verbalize understanding. R: Voice no other concerns. Staff continue to monitor

## 2016-12-05 NOTE — Plan of Care (Signed)
Problem: Health Behavior/Discharge Planning: Goal: Compliance with treatment plan for underlying cause of condition will improve Outcome: Not Progressing Remains to  Isolate  To room

## 2016-12-05 NOTE — Plan of Care (Signed)
Problem: Education: Goal: Verbalization of understanding the information provided will improve Outcome: Not Progressing Unable to process information given , Staff redirect  Information given

## 2016-12-05 NOTE — [facility] Group Notes (Signed)
[facility] LCSW Group Therapy  12/05/2016 2:25 PM  Type of Therapy:  Group Therapy  Participation Level:  Patient did not attend group. CSW invited patient to group.   Summary of Progress/Problems: Coping Skills: Patients defined and discussed healthy coping skills. Patients identified healthy coping skills they would like to try during hospitalization and after discharge. CSW offered insight to varying coping skills that may have been new to patients such as practicing mindfulness.  Raeven Pint G. Garnette Czech MSW, LCSWA 12/05/2016, 2:27 PM

## 2016-12-05 NOTE — Plan of Care (Signed)
Problem: Activity: Goal: Interest or engagement in activities will improve Outcome: Not Progressing Remains to isolate to room

## 2016-12-05 NOTE — Plan of Care (Signed)
Problem: Education: Goal: Knowledge of [facility] Education information/materials will improve Outcome: Not Progressing Unable to verbalize  Educational needs  Staff continue to    Redirect  Issues or concerns .

## 2016-12-05 NOTE — Progress Notes (Signed)
[facility] MD Progress Note  12/05/2016 12:38 PM Tony Cunningham  MRN:  [id number]  Subjective:  Tony Cunningham is a 41 year old male with prior diagnosis of schizoaffective disorder on  ECT .    7/14-  Patient had bilateral ECT yesterday without complication. Reports feeling better than before, but needs encouragement of ADL, isolative, minimal interaction with others.  Med  Complaint, denies side effects. Slept well. Reports has not heard voices lately.   Per nursing: Tony Cunningham was cooperative with treatment. His toilet was stopped up at the beginning of shift and the patient was directed to unstop it and the toilet began working again. He was visible in the milieu but had minimal interaction with peers and staff. Affect is still flat but he engaged in appropriate conversation with Clinical research associate. He was medication compliant although.  No behavioral issues .   Principal Problem: Schizoaffective disorder, depressive type (HCC) Diagnosis:   Patient Active Problem List   Diagnosis Date Noted  . Schizoaffective disorder, depressive type (HCC) [F25.1] 09/23/2016  . Polysubstance abuse [F19.10] 07/17/2016  . Tardive dyskinesia [G24.01] 07/16/2016  . Tobacco use disorder [F17.200] 07/07/2016  . Dyslipidemia [E78.5] 07/07/2016  . Asthma [J45.909] 07/07/2016  . HTN (hypertension) [I10] 07/06/2016  . Diabetes (HCC) [E11.9] 12/25/2010   Total Time spent with patient: 20 minutes    Past Medical History:  Past Medical History:  Diagnosis Date  . Anxiety   . Asthma   . Diabetes mellitus   . High blood pressure   . Sinus complaint    History reviewed. No pertinent surgical history.  Social History:  History  Alcohol Use No     History  Drug Use No    Social History   Social History  . Marital status: Single    Spouse name: [name]  . Number of children: [name]  . Years of education: [name]   Social History Main Topics  . Smoking status: Current Every Day Smoker    Packs/day: 0.50    Types: Cigarettes  .  Smokeless tobacco: Never Used  . Alcohol use No  . Drug use: No  . Sexual activity: Not Currently   Other Topics Concern  . None   Social History Narrative  . None   Additional Social History:                         Sleep: Good  Appetite:  Good  Current Medications: Current Facility-Administered Medications  Medication Dose Route Frequency Provider Last Rate Last Dose  . acetaminophen (TYLENOL) tablet 650 mg  650 mg Oral Q6H PRN [name], [name], MD   650 mg at 11/19/16 1739  . alum & mag hydroxide-simeth (MAALOX/MYLANTA) 200-200-20 MG/5ML suspension 30 mL  30 mL Oral Q4H PRN [name], [name], MD      . cloZAPine (CLOZARIL) tablet 100 mg  100 mg Oral QHS [name], [name], MD   100 mg at 12/04/16 2134  . dextrose 5 %-0.45 % sodium chloride infusion   Intravenous Continuous [name], [name], MD 10 mL/hr at 11/13/16 1009    . feeding supplement (GLUCERNA SHAKE) (GLUCERNA SHAKE) liquid 237 mL  237 mL Oral TID WC [name], [name], MD   237 mL at 12/05/16 1208  . fluvoxaMINE (LUVOX) tablet 100 mg  100 mg Oral QHS [name], [name], MD   100 mg at 12/04/16 2134  . glycopyrrolate (ROBINUL) injection 0.2 mg  0.2 mg Intravenous Once [name], [name], MD      .  insulin aspart (novoLOG) injection 0-15 Units  0-15 Units Subcutaneous TID WC Alford Highland, MD   3 Units at 12/05/16 [phone]  . insulin aspart (novoLOG) injection 0-5 Units  0-5 Units Subcutaneous QHS Alford Highland, MD   3 Units at 12/03/16 2112  . insulin aspart (novoLOG) injection 10 Units  10 Units Subcutaneous TID WC Enedina Finner, MD   10 Units at 12/05/16 0743  . insulin glargine (LANTUS) injection 55 Units  55 Units Subcutaneous QHS Enedina Finner, MD   55 Units at 12/04/16 2138  . lisinopril (PRINIVIL,ZESTRIL) tablet 20 mg  20 mg Oral Daily [name], [name], MD   20 mg at 12/05/16 0744  . magnesium hydroxide (MILK OF MAGNESIA) suspension 30 mL  30 mL Oral Daily PRN [name], [name], MD      . meloxicam (MOBIC) tablet  7.5 mg  7.5 mg Oral BID PC [name], [name], MD   7.5 mg at 12/05/16 0804  . metFORMIN (GLUCOPHAGE) tablet 1,000 mg  1,000 mg Oral BID WC [name], [name], MD   1,000 mg at 12/05/16 0744  . montelukast (SINGULAIR) tablet 10 mg  10 mg Oral QHS [name], [name], MD   10 mg at 12/04/16 2134  . nicotine (NICODERM CQ - dosed in mg/24 hr) patch 7 mg  7 mg Transdermal Daily Darliss Ridgel, MD   7 mg at 12/05/16 0744  . pantoprazole (PROTONIX) EC tablet 40 mg  40 mg Oral Daily [name], [name], MD   40 mg at 12/05/16 0744  . polyethylene glycol (MIRALAX / GLYCOLAX) packet 17 g  17 g Oral Daily [name], [name], MD   17 g at 12/05/16 0743  . promethazine (PHENERGAN) tablet 12.5 mg  12.5 mg Oral Q6H PRN [name], [name], MD      . propranolol (INDERAL) tablet 20 mg  20 mg Oral BID [name], [name], MD   20 mg at 12/05/16 0744  . simvastatin (ZOCOR) tablet 40 mg  40 mg Oral q1800 [name], [name], MD   40 mg at 12/04/16 1700  . temazepam (RESTORIL) capsule 15 mg  15 mg Oral QHS [name], [name], MD   15 mg at 12/04/16 2134    Lab Results:  Results for orders placed or performed during the hospital encounter of 11/06/16 (from the past 48 hour(s))  Glucose, capillary     Status: Abnormal   Collection Time: 12/03/16  4:31 PM  Result Value Ref Range   Glucose-Capillary 133 (H) 65 - 99 mg/dL   Comment 1 Document in Chart   Glucose, capillary     Status: Abnormal   Collection Time: 12/03/16  8:41 PM  Result Value Ref Range   Glucose-Capillary 225 (H) 65 - 99 mg/dL  Glucose, capillary     Status: Abnormal   Collection Time: 12/04/16  6:47 AM  Result Value Ref Range   Glucose-Capillary 200 (H) 65 - 99 mg/dL  Glucose, capillary     Status: Abnormal   Collection Time: 12/04/16 11:47 AM  Result Value Ref Range   Glucose-Capillary 125 (H) 65 - 99 mg/dL   Comment 1 Notify RN   Glucose, capillary     Status: Abnormal   Collection Time: 12/04/16  4:29 PM  Result Value Ref Range   Glucose-Capillary 174  (H) 65 - 99 mg/dL  Glucose, capillary     Status: Abnormal   Collection Time: 12/04/16  9:36 PM  Result Value Ref Range   Glucose-Capillary 160 (H)  65 - 99 mg/dL  Glucose, capillary     Status: Abnormal   Collection Time: 12/05/16  6:32 AM  Result Value Ref Range   Glucose-Capillary 199 (H) 65 - 99 mg/dL  Glucose, capillary     Status: None   Collection Time: 12/05/16 11:37 AM  Result Value Ref Range   Glucose-Capillary 88 65 - 99 mg/dL    Blood Alcohol level:  Lab Results  Component Value Date   ETH <5 10/21/2016   ETH <5 10/07/2016    Metabolic Disorder Labs: Lab Results  Component Value Date   HGBA1C 10.3 (H) 11/09/2016   MPG 249 11/09/2016   MPG 275 10/09/2016   Lab Results  Component Value Date   PROLACTIN 24.5 (H) 09/24/2016   PROLACTIN 3.4 (L) 07/07/2016   Lab Results  Component Value Date   CHOL 92 11/09/2016   TRIG 54 11/09/2016   HDL 39 (L) 11/09/2016   CHOLHDL 2.4 11/09/2016   VLDL 11 11/09/2016   LDLCALC 42 11/09/2016   LDLCALC 72 10/09/2016    Physical Findings: AIMS: Facial and Oral Movements Muscles of Facial Expression: Mild Lips and Perioral Area: Mild Jaw: Minimal Tongue: Minimal,Extremity Movements Upper (arms, wrists, hands, fingers): None, normal Lower (legs, knees, ankles, toes): Mild, Trunk Movements Neck, shoulders, hips: Minimal, Overall Severity Severity of abnormal movements (highest score from questions above): Mild Incapacitation due to abnormal movements: None, normal Patient's awareness of abnormal movements (rate only patient's report): No Awareness, Dental Status Current problems with teeth and/or dentures?: No Does patient usually wear dentures?: No   Musculoskeletal: Strength & Muscle Tone: within normal limits Gait & Station: normal Patient leans: [name]  Psychiatric Specialty Exam: Physical Exam  Nursing note and vitals reviewed. Constitutional: He appears well-developed and well-nourished.  HENT:  Head:  Normocephalic and atraumatic.  Eyes: Pupils are equal, round, and reactive to light. Conjunctivae are normal.  Neck: Normal range of motion.  Cardiovascular: Regular rhythm and normal heart sounds.   Respiratory: Effort normal.  GI: Soft.  Musculoskeletal: Normal range of motion.  Neurological: He is alert.  Skin: Skin is warm and dry.  Psychiatric: Judgment normal. His affect is not blunt. His speech is delayed. He is slowed. Thought content is not paranoid and not delusional. Cognition and memory are normal.    Review of Systems  Constitutional: Negative.   HENT: Negative.   Eyes: Negative.   Cardiovascular: Negative.   Gastrointestinal: Negative.   Genitourinary: Negative.   Musculoskeletal: Negative.   Skin: Negative.   Neurological: Negative.   Endo/Heme/Allergies: Negative.   Psychiatric/Behavioral: Negative for depression and hallucinations.    Blood pressure 116/75, pulse 73, temperature 98.1 F (36.7 C), temperature source Oral, resp. rate 18, height 5\' 8"  (1.727 m), weight 81.6 kg (180 lb), SpO2 95 %.Body mass index is 27.37 kg/m.  General Appearance: Disheveled  Eye Contact:  Poor  Speech:  Slow  Volume:  Decreased  Mood:  Dysphoric  Affect:  Blunt  Thought Process:  Linear  Orientation:  Full (Time, Place, and Person)  Thought Content:  Paranoid Ideation  Suicidal Thoughts:  No  Homicidal Thoughts:  No  Memory:  Immediate;   Fair Recent;   Fair Remote;   Fair  Judgement:  Impaired  Insight:  Lacking  Psychomotor Activity:  Decreased  Concentration:  Concentration: Fair and Attention Span: Fair  Recall:  of Knowledge:  Fair  Language:  Good  Akathisia:  No  Handed:  Right  AIMS (if indicated):  Assets:  Housing Physical Health  ADL's:  Intact  Cognition:  WNL  Sleep:  Number of Hours: 7.5     Treatment Plan Summary:  Tony Cunningham 41 year old male with prior diagnosis of schizoaffective disorder who presented to the emergency room with  vague suicidal thoughts as well as paranoid thoughts. He does. Be responding to internal stimuli and has endorsed some auditory and visual hallucinations. He will be admitted to inpatient psychiatry for medication management, safety and stabilization.   1. Schizoaffective disorder, depressive type.   Continue Clozapine  2. ECT. Next on Monday 3. Metabolic syndrome monitoring. Lipid panel is normal, hemoglobin A1c 103.  4. EKG. Normal sinus rhythm, QTc 417.  5. Questionable history of polysubstance abuse: The patient denies any history of any polysubstance use but records indicate that there are may have been a history of polysubstance use. He was advised to abstain from alcohol and all illicit drugs as it may worsen anxiety mood symptoms as well as psychosis. The patient is not interested in any substance abuse treatment.  6. Nicotine use disorder. Nicotine patch is available.   7. Diabetes. He is on Metformin, Novolog 10 units with meals and Lantus 55 units, along with ADA diet and blood sugar monitoring. Medicine input is greatly appreciated. Glucerna is discontinued.  8. Hypertension: Vital signs are stable. We'll continue lisinopril 20 mg daily.  9. Seasonal allergies: Continue Singulair 10 mg nightly  10. Anxiety/restlessness. He is on Propranolol.   11. Insomnia. We discontinued Trazodone and started Restoril. Slept well  12. Back pain.  Robaxin.  13. Disposition: The patient will return to a group home. He will follow up with [facility] in [facility]. Consider ACT team.   doing somewhat better.  Encouraged him to contact his family continue going to groups and be social. No change to medicine. ECT  on Monday .  Beverly Sessions, MD 12/05/2016, 12:38 PMPatient ID: Tony Cunningham, male   DOB: [birth year], 41 y.o.   MRN: [id number]

## 2016-12-05 NOTE — Progress Notes (Signed)
Patient ID: Tony Cunningham, male   DOB: 07/23/1975, 41 y.o.   MRN: [id number]  CSW received returned call from patient's sister-in-law Lorietta [phone]. CSW provided update on patient tentative discharge date which is scheduled for 12/07/2016. Informed her that patient is still to follow-up with ACTT team for outpatient services and will continue outpatient maintenance ECT is possible. Sister-in-law had no further questions at this time.   Mate Alegria G. Garnette Czech MSW, LCSWA 12/05/2016 11:03 AM

## 2016-12-05 NOTE — Plan of Care (Signed)
Problem: Health Behavior/Discharge Planning: Goal: Identification of resources available to assist in meeting health care needs will improve Outcome: Not Progressing  Un able to understand  Information  Received  Staff redirected

## 2016-12-05 NOTE — Plan of Care (Signed)
Problem: Coping: Goal: Ability to verbalize frustrations and anger appropriately will improve Outcome: Progressing Patient able to let  Staff know of his concerns

## 2016-12-05 NOTE — Progress Notes (Signed)
Tony Cunningham was cooperative with treatment. His toilet was stopped up at the beginning of shift and the patient was directed to unstop it and the toilet began working again. He was visible in the milieu but had minimal interaction with peers and staff. Affect is still flat but he engaged in appropriate conversation with Clinical research associate. He was medication compliant although.  No behavioral issues to report on shift at this time.  He appears to be resting in bed quietly at this time.

## 2016-12-05 NOTE — Plan of Care (Signed)
Problem: Coping: Goal: Ability to demonstrate self-control will improve Outcome: Progressing No issues with  Outburst or  Inappropriate behavior

## 2016-12-05 NOTE — Plan of Care (Signed)
Problem: Activity: Goal: Sleeping patterns will improve Outcome: Progressing No indication with problems with sleep

## 2016-12-06 LAB — GLUCOSE, CAPILLARY
GLUCOSE-CAPILLARY: 131 mg/dL — AB (ref 65–99)
GLUCOSE-CAPILLARY: 260 mg/dL — AB (ref 65–99)
Glucose-Capillary: 256 mg/dL — ABNORMAL HIGH (ref 65–99)
Glucose-Capillary: 278 mg/dL — ABNORMAL HIGH (ref 65–99)

## 2016-12-06 NOTE — Plan of Care (Signed)
Problem: Coping: Goal: Ability to interact with others will improve Outcome: Progressing Slightly improved interaction Goal: Participation in decision-making will improve Outcome: Not Progressing Not able to make decisions Goal: Ability to use eye contact when communicating with others will improve Outcome: Progressing Slightly improved  Problem: Health Behavior/Discharge Planning: Goal: Identification of resources available to assist in meeting health care needs will improve Outcome: Not Progressing Not able to identify those needs

## 2016-12-06 NOTE — Progress Notes (Signed)
Pt isolative and on periphery of the unit.  Pt malodorous and when this is discussed with the pt he states "why do I always forget about this?"  Pt encouraged to shower and states he will in the am for ECT.  Pt NPO after midnight for ECT in the am.  Denies s/i, h/i or hallucinations though appears preoccupied.

## 2016-12-06 NOTE — [facility] Group Notes (Signed)
[facility] Group Notes:  (Nursing/MHT/Case Management/Adjunct)  Date:  12/06/2016  Time:  4:54 AM  Type of Therapy:  Psychoeducational Skills  Participation Level:  Active  Participation Quality:  Appropriate, Attentive and Sharing  Affect:  Appropriate  Cognitive:  Appropriate  Insight:  Appropriate, Good and Improving  Engagement in Group:  Engaged and Improving  Modes of Intervention:  Discussion, Socialization and Support  Summary of Progress/Problems:  Tony Cunningham 12/06/2016, 4:54 AM

## 2016-12-06 NOTE — Progress Notes (Signed)
[facility] MD Progress Note  12/06/2016 10:47 AM Bookert Guzzi  MRN:  [id number]  Subjective:  Mr. Lundahl is a 41 year old male with prior diagnosis of schizoaffective disorder on  ECT .    7/15-  Patient had bilateral ECT on Fri without complication, next planned tomorrow. ADL still poor, endorsing depression, mood " so so", denies SI, more interactive with peers.  Med  Complaint, denies side effects. Slept well. Denies AVH.   Per nursing: Patient attended group this shift . Able to speak with his dad whom lives in [place]  Patient remains to show any thoughts of cleaning himself up. Clothing unclean .Patient has a odor.  Compliant with medication . Isolates to room during shift.  Appetite good  Answers  With one word Patient stated slept good last night . Denies suicidal  homicidal ideations  . No pain concerns . Appropriate ADL'S.Limiting  Interacting with peers and staff.  A: Encourage patient participation with unit programming . Instruction  Given on  Medication , verbalize understanding. R: Voice no other concerns. Staff continue to monitor   Principal Problem: Schizoaffective disorder, depressive type (HCC) Diagnosis:   Patient Active Problem List   Diagnosis Date Noted  . Schizoaffective disorder, depressive type (HCC) [F25.1] 09/23/2016  . Polysubstance abuse [F19.10] 07/17/2016  . Tardive dyskinesia [G24.01] 07/16/2016  . Tobacco use disorder [F17.200] 07/07/2016  . Dyslipidemia [E78.5] 07/07/2016  . Asthma [J45.909] 07/07/2016  . HTN (hypertension) [I10] 07/06/2016  . Diabetes (HCC) [E11.9] 12/25/2010   Total Time spent with patient: 20 minutes    Past Medical History:  Past Medical History:  Diagnosis Date  . Anxiety   . Asthma   . Diabetes mellitus   . High blood pressure   . Sinus complaint    History reviewed. No pertinent surgical history.  Social History:  History  Alcohol Use No     History  Drug Use No    Social History   Social History  . Marital status:  Single    Spouse name: [name]  . Number of children: [name]  . Years of education: [name]   Social History Main Topics  . Smoking status: Current Every Day Smoker    Packs/day: 0.50    Types: Cigarettes  . Smokeless tobacco: Never Used  . Alcohol use No  . Drug use: No  . Sexual activity: Not Currently   Other Topics Concern  . None   Social History Narrative  . None   Additional Social History:                         Sleep: Good  Appetite:  Good  Current Medications: Current Facility-Administered Medications  Medication Dose Route Frequency Provider Last Rate Last Dose  . acetaminophen (TYLENOL) tablet 650 mg  650 mg Oral Q6H PRN [name], [name], MD   650 mg at 11/19/16 1739  . alum & mag hydroxide-simeth (MAALOX/MYLANTA) 200-200-20 MG/5ML suspension 30 mL  30 mL Oral Q4H PRN [name], [name], MD      . cloZAPine (CLOZARIL) tablet 100 mg  100 mg Oral QHS [name], [name], MD   100 mg at 12/05/16 2111  . dextrose 5 %-0.45 % sodium chloride infusion   Intravenous Continuous [name], [name], MD 10 mL/hr at 11/13/16 1009    . feeding supplement (GLUCERNA SHAKE) (GLUCERNA SHAKE) liquid 237 mL  237 mL Oral TID WC [name], [name], MD   237 mL at 12/06/16 6606  .  fluvoxaMINE (LUVOX) tablet 100 mg  100 mg Oral QHS [name], [name], MD   100 mg at 12/05/16 2110  . glycopyrrolate (ROBINUL) injection 0.2 mg  0.2 mg Intravenous Once [name], [name], MD      . insulin aspart (novoLOG) injection 0-15 Units  0-15 Units Subcutaneous TID WC Alford Highland, MD   2 Units at 12/06/16 0732  . insulin aspart (novoLOG) injection 0-5 Units  0-5 Units Subcutaneous QHS Alford Highland, MD   4 Units at 12/05/16 2112  . insulin aspart (novoLOG) injection 10 Units  10 Units Subcutaneous TID WC Enedina Finner, MD   10 Units at 12/06/16 0732  . insulin glargine (LANTUS) injection 55 Units  55 Units Subcutaneous QHS Enedina Finner, MD   55 Units at 12/05/16 2110  . lisinopril (PRINIVIL,ZESTRIL)  tablet 20 mg  20 mg Oral Daily [name], [name], MD   20 mg at 12/06/16 0807  . magnesium hydroxide (MILK OF MAGNESIA) suspension 30 mL  30 mL Oral Daily PRN [name], [name], MD      . meloxicam (MOBIC) tablet 7.5 mg  7.5 mg Oral BID PC [name], [name], MD   7.5 mg at 12/06/16 0807  . metFORMIN (GLUCOPHAGE) tablet 1,000 mg  1,000 mg Oral BID WC [name], [name], MD   1,000 mg at 12/06/16 0807  . montelukast (SINGULAIR) tablet 10 mg  10 mg Oral QHS [name], [name], MD   10 mg at 12/05/16 2110  . nicotine (NICODERM CQ - dosed in mg/24 hr) patch 7 mg  7 mg Transdermal Daily Darliss Ridgel, MD   7 mg at 12/05/16 0744  . pantoprazole (PROTONIX) EC tablet 40 mg  40 mg Oral Daily [name], [name], MD   40 mg at 12/06/16 0807  . polyethylene glycol (MIRALAX / GLYCOLAX) packet 17 g  17 g Oral Daily [name], [name], MD   17 g at 12/05/16 0743  . promethazine (PHENERGAN) tablet 12.5 mg  12.5 mg Oral Q6H PRN [name], [name], MD      . propranolol (INDERAL) tablet 20 mg  20 mg Oral BID [name], [name], MD   20 mg at 12/06/16 0807  . simvastatin (ZOCOR) tablet 40 mg  40 mg Oral q1800 [name], [name], MD   40 mg at 12/05/16 1722  . temazepam (RESTORIL) capsule 15 mg  15 mg Oral QHS [name], [name], MD   15 mg at 12/05/16 2110    Lab Results:  Results for orders placed or performed during the hospital encounter of 11/06/16 (from the past 48 hour(s))  Glucose, capillary     Status: Abnormal   Collection Time: 12/04/16 11:47 AM  Result Value Ref Range   Glucose-Capillary 125 (H) 65 - 99 mg/dL   Comment 1 Notify RN   Glucose, capillary     Status: Abnormal   Collection Time: 12/04/16  4:29 PM  Result Value Ref Range   Glucose-Capillary 174 (H) 65 - 99 mg/dL  Glucose, capillary     Status: Abnormal   Collection Time: 12/04/16  9:36 PM  Result Value Ref Range   Glucose-Capillary 160 (H) 65 - 99 mg/dL  Glucose, capillary     Status: Abnormal   Collection Time: 12/05/16  6:32 AM  Result Value Ref  Range   Glucose-Capillary 199 (H) 65 - 99 mg/dL  Glucose, capillary     Status: None   Collection Time: 12/05/16 11:37 AM  Result Value Ref Range  Glucose-Capillary 88 65 - 99 mg/dL  Glucose, capillary     Status: Abnormal   Collection Time: 12/05/16  4:17 PM  Result Value Ref Range   Glucose-Capillary 302 (H) 65 - 99 mg/dL  Glucose, capillary     Status: Abnormal   Collection Time: 12/05/16  9:09 PM  Result Value Ref Range   Glucose-Capillary 313 (H) 65 - 99 mg/dL  Glucose, capillary     Status: Abnormal   Collection Time: 12/06/16  6:50 AM  Result Value Ref Range   Glucose-Capillary 131 (H) 65 - 99 mg/dL    Blood Alcohol level:  Lab Results  Component Value Date   ETH <5 10/21/2016   ETH <5 10/07/2016    Metabolic Disorder Labs: Lab Results  Component Value Date   HGBA1C 10.3 (H) 11/09/2016   MPG 249 11/09/2016   MPG 275 10/09/2016   Lab Results  Component Value Date   PROLACTIN 24.5 (H) 09/24/2016   PROLACTIN 3.4 (L) 07/07/2016   Lab Results  Component Value Date   CHOL 92 11/09/2016   TRIG 54 11/09/2016   HDL 39 (L) 11/09/2016   CHOLHDL 2.4 11/09/2016   VLDL 11 11/09/2016   LDLCALC 42 11/09/2016   LDLCALC 72 10/09/2016    Physical Findings: AIMS: Facial and Oral Movements Muscles of Facial Expression: Mild Lips and Perioral Area: Mild Jaw: Minimal Tongue: Minimal,Extremity Movements Upper (arms, wrists, hands, fingers): None, normal Lower (legs, knees, ankles, toes): Mild, Trunk Movements Neck, shoulders, hips: Minimal, Overall Severity Severity of abnormal movements (highest score from questions above): Mild Incapacitation due to abnormal movements: None, normal Patient's awareness of abnormal movements (rate only patient's report): No Awareness, Dental Status Current problems with teeth and/or dentures?: No Does patient usually wear dentures?: No   Musculoskeletal: Strength & Muscle Tone: within normal limits Gait & Station: normal Patient  leans: [name]  Psychiatric Specialty Exam: Physical Exam  Nursing note and vitals reviewed. Constitutional: He appears well-developed and well-nourished.  HENT:  Head: Normocephalic and atraumatic.  Eyes: Pupils are equal, round, and reactive to light. Conjunctivae are normal.  Neck: Normal range of motion.  Cardiovascular: Regular rhythm and normal heart sounds.   Respiratory: Effort normal.  GI: Soft.  Musculoskeletal: Normal range of motion.  Neurological: He is alert.  Skin: Skin is warm and dry.  Psychiatric: Judgment normal. His affect is not blunt. His speech is delayed. He is slowed. Thought content is not paranoid and not delusional. Cognition and memory are normal.    Review of Systems  Constitutional: Negative.   HENT: Negative.   Eyes: Negative.   Cardiovascular: Negative.   Gastrointestinal: Negative.   Genitourinary: Negative.   Musculoskeletal: Negative.   Skin: Negative.   Neurological: Negative.   Endo/Heme/Allergies: Negative.   Psychiatric/Behavioral: Negative for depression and hallucinations.    Blood pressure 113/76, pulse 75, temperature 98 F (36.7 C), temperature source Oral, resp. rate 18, height 5\' 8"  (1.727 m), weight 81.6 kg (180 lb), SpO2 95 %.Body mass index is 27.37 kg/m.  General Appearance: Disheveled  Eye Contact:  Poor  Speech:  Slow  Volume:  Decreased  Mood:  " so so"  Affect:  Blunt  Thought Process:  Linear  Orientation:  Full (Time, Place, and Person)  Thought Content:  Paranoid Ideation  Suicidal Thoughts:  No  Homicidal Thoughts:  No  Memory:  Immediate;   Fair Recent;   Fair Remote;   Fair  Judgement:  Impaired  Insight:  Lacking  Psychomotor Activity:  Decreased  Concentration:  Concentration: Fair and Attention Span: Fair  Recall:  [employer] of Knowledge:  Fair  Language:  Good  Akathisia:  No  Handed:  Right  AIMS (if indicated):     Assets:  Housing Physical Health  ADL's:  Intact  Cognition:  WNL  Sleep:   Number of Hours: 6.45     Treatment Plan Summary:  Mr. Veltre 41 year old male with prior diagnosis of schizoaffective disorder who presented to the emergency room with vague suicidal thoughts as well as paranoid thoughts. He does. Be responding to internal stimuli and has endorsed some auditory and visual hallucinations. He will be admitted to inpatient psychiatry for medication management, safety and stabilization. Pt still depressed, has poor ADL.   1. Schizoaffective disorder, depressive type.   Continue Clozapine  2. ECT. Next on Monday 3. Metabolic syndrome monitoring. Lipid panel is normal, hemoglobin A1c 103.  4. EKG. Normal sinus rhythm, QTc 417.  5. Questionable history of polysubstance abuse: The patient denies any history of any polysubstance use but records indicate that there are may have been a history of polysubstance use. He was advised to abstain from alcohol and all illicit drugs as it may worsen anxiety mood symptoms as well as psychosis. The patient is not interested in any substance abuse treatment.  6. Nicotine use disorder. Nicotine patch is available.   7. Diabetes. He is on Metformin, Novolog 10 units with meals and Lantus 55 units, along with ADA diet and blood sugar monitoring. Medicine input is greatly appreciated. Glucerna is discontinued.  8. Hypertension: Vital signs are stable. We'll continue lisinopril 20 mg daily.  9. Seasonal allergies: Continue Singulair 10 mg nightly  10. Anxiety/restlessness. He is on Propranolol.   11. Insomnia. We discontinued Trazodone and started Restoril. Slept well  12. Back pain.  Robaxin.  13. Disposition: The patient will return to a group home. He will follow up with [facility] in [facility]. Consider ACT team.   doing somewhat better.  Encouraged him to contact his family continue going to groups and be social. No change to medicine. ECT  on Monday .  Beverly Sessions, MD 12/06/2016, 10:47 AMPatient ID: [name],  male   DOB: 04-28-1976, 41 y.o.   MRN: [id number] Patient ID: Bernie Ransford, male   DOB: Jan 04, 1976, 41 y.o.   MRN: [id number]

## 2016-12-06 NOTE — [facility] Group Notes (Signed)
[facility] LCSW Group Therapy  12/06/2016 2:01 PM  Type of Therapy:  Group Therapy  Participation Level:  Patient did not attend group. CSW invited patient to group.   Summary of Progress/Problems: Stress management: Patients defined and discussed the topic of stress and the related symptoms and triggers for stress. Patients identified healthy coping skills they would like to try during hospitalization and after discharge to manage stress in a healthy way. CSW offered insight to varying stress management techniques.   Pride Gonzales G. Garnette Czech MSW, LCSWA 12/06/2016, 2:02 PM

## 2016-12-06 NOTE — Plan of Care (Signed)
Problem: Safety: Goal: Periods of time without injury will increase Outcome: Progressing Patient remains safe and without injury during hospitalization and on Q 15 minute observation. Will continue to monitor patient.    

## 2016-12-06 NOTE — Progress Notes (Signed)
Patient was visible on the unit for meals, but stayed in room. Patient was pleasant with staff. Patient denies SI/HI/AH/VH. Patient is alert and oriented x 4, breathing unlabored, and extremities x 4 within normal limits. Patient is calm and cooperative. Patient did not display any disruptive behavior. Patient continues to be monitored on 15 minute safety checks. Will continue to monitor patient and notify MD of any changes.

## 2016-12-06 NOTE — Plan of Care (Signed)
Problem: Coping: Goal: Ability to identify and develop effective coping behavior will improve Outcome: Progressing Patient spent most of this evening with peers in the dayroom.  He was pleasant on contact and was observed engaging well with others.  He demonstrates understanding of his medications when information was reviewed during medication pass. Patient had BG of 313 at 2030 and was given 4 units of sliding scale insulin.

## 2016-12-07 ENCOUNTER — Other Ambulatory Visit: Payer: Self-pay | Admitting: Psychiatry

## 2016-12-07 ENCOUNTER — Encounter: Payer: Self-pay | Admitting: Anesthesiology

## 2016-12-07 ENCOUNTER — Inpatient Hospital Stay: Payer: Medicare Other | Admitting: Certified Registered Nurse Anesthetist

## 2016-12-07 ENCOUNTER — Telehealth: Payer: Self-pay | Admitting: *Deleted

## 2016-12-07 LAB — GLUCOSE, CAPILLARY
GLUCOSE-CAPILLARY: 173 mg/dL — AB (ref 65–99)
GLUCOSE-CAPILLARY: 76 mg/dL (ref 65–99)
Glucose-Capillary: 139 mg/dL — ABNORMAL HIGH (ref 65–99)
Glucose-Capillary: 127 mg/dL — ABNORMAL HIGH (ref 65–99)
Glucose-Capillary: 83 mg/dL (ref 65–99)

## 2016-12-07 MED ORDER — MIDAZOLAM HCL 2 MG/2ML IJ SOLN
INTRAMUSCULAR | Status: AC
  Filled 2016-12-07: qty 2

## 2016-12-07 MED ORDER — ONDANSETRON HCL 4 MG/2ML IJ SOLN: 4.0000 mg | Freq: Once | INTRAMUSCULAR | Status: DC | PRN

## 2016-12-07 MED ORDER — METHOHEXITAL SODIUM 100 MG/10ML IV SOSY
PREFILLED_SYRINGE | INTRAVENOUS | Status: DC | PRN
  Administered 2016-12-07: 80 mg via INTRAVENOUS

## 2016-12-07 MED ORDER — SUCCINYLCHOLINE CHLORIDE 20 MG/ML IJ SOLN
INTRAMUSCULAR | Status: DC | PRN
  Administered 2016-12-07: 100 mg via INTRAVENOUS

## 2016-12-07 MED ORDER — SODIUM CHLORIDE 0.9 % IV SOLN
500.0000 mL | Freq: Once | INTRAVENOUS | Status: AC
  Administered 2016-12-07: 1000 mL via INTRAVENOUS

## 2016-12-07 MED ORDER — SUCCINYLCHOLINE CHLORIDE 20 MG/ML IJ SOLN
INTRAMUSCULAR | Status: AC
  Filled 2016-12-07: qty 1

## 2016-12-07 MED ORDER — FENTANYL CITRATE (PF) 100 MCG/2ML IJ SOLN: 25.0000 ug | INTRAMUSCULAR | Status: DC | PRN

## 2016-12-07 MED ORDER — HALOPERIDOL LACTATE 5 MG/ML IJ SOLN
INTRAMUSCULAR | Status: DC | PRN
  Administered 2016-12-07: 5 mg via INTRAVENOUS

## 2016-12-07 MED ORDER — METHOHEXITAL SODIUM 0.5 G IJ SOLR
INTRAMUSCULAR | Status: AC
  Filled 2016-12-07: qty 500

## 2016-12-07 MED ORDER — HALOPERIDOL LACTATE 5 MG/ML IJ SOLN
INTRAMUSCULAR | Status: AC
  Filled 2016-12-07: qty 1

## 2016-12-07 MED ORDER — SODIUM CHLORIDE 0.9 % IV SOLN
INTRAVENOUS | Status: DC | PRN
  Administered 2016-12-07: 10:00:00 via INTRAVENOUS

## 2016-12-07 MED ORDER — MIDAZOLAM HCL 2 MG/2ML IJ SOLN
INTRAMUSCULAR | Status: DC | PRN
  Administered 2016-12-07: 4 mg via INTRAVENOUS

## 2016-12-07 NOTE — Anesthesia Post-op Follow-up Note (Cosign Needed)
Anesthesia QCDR form completed.        

## 2016-12-07 NOTE — Progress Notes (Signed)
Pt isolative to room this morning, sleeping.  Transferred to ECT this morning, tolerated well.  Returned to unit prior to lunch.  CBG 127.  Pt accepted lunch and returned to room to rest.  Remains confused, disoriented.  Notified discharge was planned for tomorrow.  Pt reports he is not ready, but accepting of this.

## 2016-12-07 NOTE — Anesthesia Preprocedure Evaluation (Addendum)
Anesthesia Evaluation  Patient identified by MRN, date of birth, ID band Patient awake    Reviewed: Allergy & Precautions, NPO status , Patient's Chart, lab work & pertinent test results, reviewed documented beta blocker date and time   Airway Mallampati: II  TM Distance: >3 FB     Dental  (+) Chipped   Pulmonary asthma , Current Smoker,           Cardiovascular hypertension, Pt. on medications and Pt. on home beta blockers      Neuro/Psych PSYCHIATRIC DISORDERS Anxiety Depression Schizophrenia    GI/Hepatic   Endo/Other  diabetes, Type 2  Renal/GU      Musculoskeletal   Abdominal   Peds  Hematology   Anesthesia Other Findings   Reproductive/Obstetrics                            Anesthesia Physical Anesthesia Plan  ASA: II  Anesthesia Plan: General   Post-op Pain Management:    Induction: Intravenous  PONV Risk Score and Plan:   Airway Management Planned: Mask  Additional Equipment:   Intra-op Plan:   Post-operative Plan:   Informed Consent: I have reviewed the patients History and Physical, chart, labs and discussed the procedure including the risks, benefits and alternatives for the proposed anesthesia with the patient or authorized representative who has indicated his/her understanding and acceptance.     Plan Discussed with: CRNA  Anesthesia Plan Comments:        Anesthesia Quick Evaluation

## 2016-12-07 NOTE — Pre-Procedure Instructions (Signed)
Spoke with Tony Cunningham regarding next outpatient ECT appointment for pt.  Informed [name]  outpatient appointment for pt is Friday, July 20th at 830am.  Pre-treatment ECT instructions given to her and pre-treatment instruction sheet will go home with pt when he is discharge.  [name] verbalized understanding of inst given.

## 2016-12-07 NOTE — Plan of Care (Signed)
Problem: Activity: Goal: Interest or engagement in activities will improve Outcome: Not Progressing Remains isolative, does not engage in group or other activities

## 2016-12-07 NOTE — [facility] Group Notes (Signed)
[facility] LCSW Group Therapy   12/07/2016 1pm Type of Therapy: Group Therapy   Participation Level: Patient invited but did not attend.     Hampton Abbot, MSW, LCSW-A 12/07/2016, 1:29PM

## 2016-12-07 NOTE — Procedures (Signed)
ECT SERVICES Physician's Interval Evaluation & Treatment Note  Patient Identification: Tony Cunningham MRN:  [id number] Date of Evaluation:  12/07/2016 TX #: 7  MADRS: 24  MMSE: 20 P.E. Findings:  No change to physical exam  Psychiatric Interval Note:  Mood significantly better although he seems to be having some cognitive impairment  Subjective:  Patient is a 41 y.o. male seen for evaluation for Electroconvulsive Therapy. Feeling better no specific complaints  Treatment Summary:   []   Right Unilateral             [x]  Bilateral   % Energy : 1.0 ms 60%   Impedance: 1890 ohms  Seizure Energy Index:    Postictal Suppression Index:    Seizure Concordance Index:    Medications  Pre Shock: Brevital 80 mg succinylcholine 100 mg  Post Shock: Versed 4 mg Haldol 5 mg  Seizure Duration: 42 seconds EMG 53 seconds EEG   Comments: Because he starting to see more cognitively impaired we will delay treatment until Friday at the earliest at which time I anticipate he will be an outpatient. We will probably be switching into maintenance if possible.   Lungs:  [x]   Clear to auscultation               []  Other:   Heart:    [x]   Regular rhythm             []  irregular rhythm    [x]   Previous H&P reviewed, patient examined and there are NO CHANGES                 []   Previous H&P reviewed, patient examined and there are changes noted.   , MD 7/16/201810:34 AM

## 2016-12-07 NOTE — Transfer of Care (Signed)
Immediate Anesthesia Transfer of Care Note  Patient: Tony Cunningham  Procedure(s) Performed: * No procedures listed *  Patient Location: PACU  Anesthesia Type:General  Level of Consciousness: sedated  Airway & Oxygen Therapy: Patient Spontanous Breathing and Patient connected to face mask oxygen  Post-op Assessment: Report given to RN and Post -op Vital signs reviewed and stable  Post vital signs: Reviewed and stable  Last Vitals:  Vitals:   12/07/16 0708 12/07/16 0917  BP: 104/73 117/83  Pulse: 81 (!) 59  Resp: 17 18  Temp: 36.8 C 36.7 C    Last Pain:  Vitals:   12/07/16 0917  TempSrc: Oral  PainSc:          Complications: No apparent anesthesia complications

## 2016-12-07 NOTE — [facility] Counselor (Signed)
Adult Comprehensive Assessment  Patient ID: Tony Cunningham, male   DOB: August 21, 1975, 41 y.o.   MRN: [id number]  Information Source: Information source: Patient  Current Stressors:  Educational / Learning stressors: n/a Employment / Job issues: Pt is on disability. Family Relationships: n/a Surveyor, quantity / Lack of resources (include bankruptcy): n/a Housing / Lack of housing: CSW still assessing appropriate discharge plan. Patient was formerly in group home.  Physical health (include injuries & life threatening diseases): diabetes, hypertension, dyslipidemia, asthma, and tardive dyskinesia.  Social relationships: n/a Substance abuse: n/a Bereavement / Loss: n/a   Living/Environment/Situation:  Living Arrangements: Group Home Living conditions (as described by patient or guardian): Fine How long has patient lived in current situation?: 3 months What is atmosphere in current home: unknown  Family History:  Marital status: Single Are you sexually active?: Yes What is your sexual orientation?: heterosexual Has your sexual activity been affected by drugs, alcohol, medication, or emotional stress?: none Does patient have children?: No  Childhood History:  By whom was/is the patient raised?: Both parents Additional childhood history information: Pt reports having a good childhood. No issues reported.  Description of patient's relationship with caregiver when they were a child: Pt reports being closer to his father.  Patient's description of current relationship with people who raised him/her: Pt sees his parents weekly. Pt reports he gets along with his parents. How were you disciplined when you got in trouble as a child/adolescent?: appropriate discipline Does patient have siblings?: Yes Number of Siblings: 3 Description of patient's current relationship with siblings: 2 brothers, 1 sister - Pt reports being close to 1 brother and his sister.  Did patient suffer any  verbal/emotional/physical/sexual abuse as a child?: No Did patient suffer from severe childhood neglect?: No Has patient ever been sexually abused/assaulted/raped as an adolescent or adult?: No Was the patient ever a victim of a crime or a disaster?: No Witnessed domestic violence?: No Has patient been effected by domestic violence as an adult?: No  Education:  Highest grade of school patient has completed: 11 Currently a Consulting civil engineer?: No Learning disability?: (unsure)  Employment/Work Situation:  Employment situation: On disability Why is patient on disability: mental health How long has patient been on disability: "all my life" Patient's job has been impacted by current illness: No What is the longest time patient has a held a job?: [employer] Where was the patient employed at that time?: 8-11 years Has patient ever been in the [employer]?: No Are There Guns or Other Weapons in Your Home?: No  Financial Resources:  Surveyor, quantity resources: Writer Does patient have a Lawyer or guardian?: No  Alcohol/Substance Abuse:  What has been your use of drugs/alcohol within the last 12 months?: Pt denies any alcohol or drug use If attempted suicide, did drugs/alcohol play a role in this?: No Alcohol/Substance Abuse Treatment Hx: Denies past history Has alcohol/substance abuse ever caused legal problems?: No  Social Support System: Conservation officer, nature Support System: Fair Museum/gallery exhibitions officer System: mom Type of faith/religion: Scientist, research (life sciences):  Leisure and Hobbies: video games  Strengths/Needs:  What things does the patient do well?: Friendly, resilient In what areas does patient struggle / problems for patient: depression, passive suicidal thoughts.   Discharge Plan:  Does patient have access to transportation?: Yes Will patient be returning to same living situation after discharge?: Pt will live with his mother.   Currently receiving community mental health services: Yes (From Whom) [facility].  Does patient have  financial barriers related to discharge medications?: No  Summary/Recommendations:   Patient is a 41 year old male admitted involuntarily with a diagnosis of Schizoaffective disorder, depressive type. Information was obtained from psychosocial assessment completed with patient and chart review conducted by this evaluator. Patient presented to the hospital on 10/21/2016 from group home after recent discharge on 10/20/2016 from inpatient behavioral unit. Patient reports primary triggers for admission were auditory hallucinations telling him to cut his wrist. Patient has been inpatient in the ED behavioral unit since 10/21/2016 and was on wait list for [facility]. Patient was admitted to inpatient behavioral medicine unit on 11/06/2016 for ECT treatment. Patient will benefit from crisis stabilization, medication evaluation, group therapy and psycho education in addition to case management for discharge. At discharge, it is recommended that patient remain compliant with established discharge plan and continued treatment.   Hampton Abbot, MSW, LCSW-A 12/07/2016, 3:43PM

## 2016-12-07 NOTE — Tx Team (Signed)
Interdisciplinary Treatment and Diagnostic Plan Update  12/07/2016 Time of Session: 11:00am Tony Cunningham MRN: [id number]  Principal Diagnosis: Schizoaffective disorder, depressive type (HCC)  Secondary Diagnoses: Principal Problem:   Schizoaffective disorder, depressive type (HCC) Active Problems:   Diabetes (HCC)   HTN (hypertension)   Tobacco use disorder   Dyslipidemia   Asthma   Tardive dyskinesia   Polysubstance abuse   Current Medications:  Current Facility-Administered Medications  Medication Dose Route Frequency Provider Last Rate Last Dose  . acetaminophen (TYLENOL) tablet 650 mg  650 mg Oral Q6H PRN [name], [name], MD   650 mg at 11/19/16 1739  . alum & mag hydroxide-simeth (MAALOX/MYLANTA) 200-200-20 MG/5ML suspension 30 mL  30 mL Oral Q4H PRN [name], [name], MD      . cloZAPine (CLOZARIL) tablet 100 mg  100 mg Oral QHS [name], [name], MD   100 mg at 12/06/16 2050  . dextrose 5 %-0.45 % sodium chloride infusion   Intravenous Continuous [name], [name], MD 10 mL/hr at 11/13/16 1009    . feeding supplement (GLUCERNA SHAKE) (GLUCERNA SHAKE) liquid 237 mL  237 mL Oral TID WC [name], [name], MD   237 mL at 12/06/16 1654  . fluvoxaMINE (LUVOX) tablet 100 mg  100 mg Oral QHS [name], [name], MD   100 mg at 12/06/16 2050  . glycopyrrolate (ROBINUL) injection 0.2 mg  0.2 mg Intravenous Once [name], [name], MD      . insulin aspart (novoLOG) injection 0-15 Units  0-15 Units Subcutaneous TID WC Alford Highland, MD   2 Units at 12/07/16 1239  . insulin aspart (novoLOG) injection 0-5 Units  0-5 Units Subcutaneous QHS Alford Highland, MD   3 Units at 12/06/16 2052  . insulin aspart (novoLOG) injection 10 Units  10 Units Subcutaneous TID WC Enedina Finner, MD   10 Units at 12/07/16 1244  . insulin glargine (LANTUS) injection 55 Units  55 Units Subcutaneous QHS Enedina Finner, MD   55 Units at 12/06/16 2051  . lisinopril (PRINIVIL,ZESTRIL) tablet 20 mg  20 mg Oral Daily  [name], [name], MD   20 mg at 12/07/16 [phone]  . magnesium hydroxide (MILK OF MAGNESIA) suspension 30 mL  30 mL Oral Daily PRN [name], [name], MD      . meloxicam (MOBIC) tablet 7.5 mg  7.5 mg Oral BID PC [name], [name], MD   7.5 mg at 12/07/16 1240  . metFORMIN (GLUCOPHAGE) tablet 1,000 mg  1,000 mg Oral BID WC [name], [name], MD   1,000 mg at 12/07/16 1240  . montelukast (SINGULAIR) tablet 10 mg  10 mg Oral QHS [name], [name], MD   10 mg at 12/06/16 2050  . nicotine (NICODERM CQ - dosed in mg/24 hr) patch 7 mg  7 mg Transdermal Daily Darliss Ridgel, MD   7 mg at 12/05/16 0744  . pantoprazole (PROTONIX) EC tablet 40 mg  40 mg Oral Daily [name], [name], MD   40 mg at 12/07/16 0643  . polyethylene glycol (MIRALAX / GLYCOLAX) packet 17 g  17 g Oral Daily [name], [name], MD   17 g at 12/05/16 0743  . promethazine (PHENERGAN) tablet 12.5 mg  12.5 mg Oral Q6H PRN [name], [name], MD      . propranolol (INDERAL) tablet 20 mg  20 mg Oral BID [name], [name], MD   20 mg at 12/07/16 0846  . simvastatin (ZOCOR) tablet 40 mg  40 mg Oral q1800 [name],  [name], MD   40 mg at 12/06/16 1653  . temazepam (RESTORIL) capsule 15 mg  15 mg Oral QHS [name], [name], MD   15 mg at 12/06/16 2051   PTA Medications: Prescriptions Prior to Admission  Medication Sig Dispense Refill Last Dose  . cloZAPine (CLOZARIL) 100 MG tablet Take 1 tablet (100 mg total) by mouth daily. Daily at 1700 30 tablet 0 11/12/2016  . cyclobenzaprine (FLEXERIL) 10 MG tablet Take 1 tablet (10 mg total) by mouth 3 (three) times daily as needed for muscle spasms. 60 tablet 0 11/12/2016  . fluvoxaMINE (LUVOX) 50 MG tablet Take 1 tablet (50 mg total) by mouth daily. Daily at 1700 30 tablet 0 11/12/2016  . insulin aspart (NOVOLOG) 100 UNIT/ML injection Inject 5 Units into the skin 3 (three) times daily with meals. 5 mL 0 11/12/2016  . insulin glargine (LANTUS) 100 UNIT/ML injection Inject 0.42 mLs (42 Units total) into the skin at  bedtime. 13 mL 0 11/12/2016  . ipratropium (ATROVENT) 0.06 % nasal spray Place 1 spray into both nostrils daily after supper. 3 mL 0 11/12/2016  . lidocaine (LIDODERM) 5 % Place 1 patch onto the skin daily. Remove & Discard patch within 12 hours or as directed by MD 30 patch 0 11/12/2016  . lisinopril (PRINIVIL,ZESTRIL) 20 MG tablet Take 1 tablet (20 mg total) by mouth daily. 30 tablet 0 12/07/2016 at 0643  . meloxicam (MOBIC) 7.5 MG tablet Take 1 tablet (7.5 mg total) by mouth 2 (two) times daily. 60 tablet 0 11/12/2016  . metFORMIN (GLUCOPHAGE) 1000 MG tablet Take 1 tablet (1,000 mg total) by mouth 2 (two) times daily with a meal. 60 tablet 0 11/12/2016  . montelukast (SINGULAIR) 10 MG tablet Take 1 tablet (10 mg total) by mouth at bedtime. For Asthma 30 tablet 0 11/12/2016  . polyethylene glycol (MIRALAX / GLYCOLAX) packet Take 17 g by mouth daily. 30 each 0 11/12/2016  . propranolol (INDERAL) 20 MG tablet Take 1 tablet (20 mg total) by mouth 2 (two) times daily. 60 tablet 0 12/07/2016 at 0643   . simvastatin (ZOCOR) 40 MG tablet Take 1 tablet (40 mg total) by mouth at bedtime. For high cholesterol 15 tablet 0 11/12/2016    Patient Stressors:    Patient Strengths:    Treatment Modalities: Medication Management, Group therapy, Case management,  1 to 1 session with clinician, Psychoeducation, Recreational therapy.   Physician Treatment Plan for Primary Diagnosis: Schizoaffective disorder, depressive type (HCC) Long Term Goal(s): Improvement in symptoms so as ready for discharge Improvement in symptoms so as ready for discharge   Short Term Goals: Ability to verbalize feelings will improve Ability to demonstrate self-control will improve Ability to identify and develop effective coping behaviors will improve Ability to identify and develop effective coping behaviors will improve Ability to identify triggers associated with substance abuse/mental health issues will improve  Medication Management:  Evaluate patient's response, side effects, and tolerance of medication regimen.  Therapeutic Interventions: 1 to 1 sessions, Unit Group sessions and Medication administration.  Evaluation of Outcomes: Adequate for discharge   Physician Treatment Plan for Secondary Diagnosis: Principal Problem:   Schizoaffective disorder, depressive type (HCC) Active Problems:   Diabetes (HCC)   HTN (hypertension)   Tobacco use disorder   Dyslipidemia   Asthma   Tardive dyskinesia   Polysubstance abuse  Long Term Goal(s): Improvement in symptoms so as ready for discharge Improvement in symptoms so as ready for discharge   Short Term Goals: Ability  to verbalize feelings will improve Ability to demonstrate self-control will improve Ability to identify and develop effective coping behaviors will improve Ability to identify and develop effective coping behaviors will improve Ability to identify triggers associated with substance abuse/mental health issues will improve     Medication Management: Evaluate patient's response, side effects, and tolerance of medication regimen.  Therapeutic Interventions: 1 to 1 sessions, Unit Group sessions and Medication administration.  Evaluation of Outcomes:  Adequate for discharge   RN Treatment Plan for Primary Diagnosis: Schizoaffective disorder, depressive type (HCC) Long Term Goal(s): Knowledge of disease and therapeutic regimen to maintain health will improve  Short Term Goals: Ability to identify and develop effective coping behaviors will improve and Compliance with prescribed medications will improve  Medication Management: RN will administer medications as ordered by provider, will assess and evaluate patient's response and provide education to patient for prescribed medication. RN will report any adverse and/or side effects to prescribing provider.  Therapeutic Interventions: 1 on 1 counseling sessions, Psychoeducation, Medication administration, Evaluate  responses to treatment, Monitor vital signs and CBGs as ordered, Perform/monitor CIWA, COWS, AIMS and Fall Risk screenings as ordered, Perform wound care treatments as ordered.  Evaluation of Outcomes:  Adequate for discharge    LCSW Treatment Plan for Primary Diagnosis: Schizoaffective disorder, depressive type (HCC) Long Term Goal(s): Safe transition to appropriate next level of care at discharge, Engage patient in therapeutic group addressing interpersonal concerns.  Short Term Goals: Engage patient in aftercare planning with referrals and resources and Increase skills for wellness and recovery  Therapeutic Interventions: Assess for all discharge needs, 1 to 1 time with Social worker, Explore available resources and support systems, Assess for adequacy in community support network, Educate family and significant other(s) on suicide prevention, Complete Psychosocial Assessment, Interpersonal group therapy.  Evaluation of Outcomes:  Adequate for discharge    Progress in Treatment: Attending groups: No. Participating in groups: No. Taking medication as prescribed: Yes. Toleration medication: Yes. Family/Significant other contact made: Yes, individual(s) contacted:  mother, sister-in-law  Patient understands diagnosis: Yes. Discussing patient identified problems/goals with staff: Yes. Medical problems stabilized or resolved: Yes. Denies suicidal/homicidal ideation: Yes. Issues/concerns per patient self-inventory: No. Other: n/a  New problem(s) identified: None identified at this time.   New Short Term/Long Term Goal(s): None identified at this time. Pt has not been participating in unit programming and mostly isolates/withdraws to room with minimum interaction with peers or staff. CSW will continue to encourager patient to attend groups.   Discharge Plan or Barriers: ECT treatments resumed on July 2nd, Pt will return home with Mother and follow-up with [facility]  Team.  Reason for Continuation of Hospitalization: Depression Medication stabilization Other; describe ECT treatment  Estimated Length of Stay: D/C Tuesday, July 17  Attendees: Patient: 12/07/2016 1:50 PM  Physician: [name], MD 12/07/2016 1:50 PM  Nursing: Nira Retort, RN 12/07/2016 1:50 PM  RN Care Manager: 12/07/2016 1:50 PM  Social Worker: Hampton Abbot, MSW, LCSW-A 12/07/2016 1:50 PM  Recreational Therapist:  12/07/2016 1:50 PM  Other:  12/07/2016 1:50 PM  Other:  12/07/2016 1:50 PM  Other: 12/07/2016 1:50 PM    Scribe for Treatment Team: Lynden Oxford, LCSWA 12/07/2016 1:50 PM

## 2016-12-07 NOTE — H&P (Signed)
Tony Cunningham is an 41 y.o. male.   Chief Complaint: Patient has no specific new complaint. HPI: History of severe depression possibly bipolar currently responding to bilateral ECT  Past Medical History:  Diagnosis Date  . Anxiety   . Asthma   . Diabetes mellitus   . High blood pressure   . Sinus complaint     History reviewed. No pertinent surgical history.  History reviewed. No pertinent family history. Social History:  reports that he has been smoking Cigarettes.  He has been smoking about 0.50 packs per day. He has never used smokeless tobacco. He reports that he does not drink alcohol or use drugs.  Allergies: No Known Allergies  Medications Prior to Admission  Medication Sig Dispense Refill  . cloZAPine (CLOZARIL) 100 MG tablet Take 1 tablet (100 mg total) by mouth daily. Daily at 1700 30 tablet 0  . cyclobenzaprine (FLEXERIL) 10 MG tablet Take 1 tablet (10 mg total) by mouth 3 (three) times daily as needed for muscle spasms. 60 tablet 0  . fluvoxaMINE (LUVOX) 50 MG tablet Take 1 tablet (50 mg total) by mouth daily. Daily at 1700 30 tablet 0  . insulin aspart (NOVOLOG) 100 UNIT/ML injection Inject 5 Units into the skin 3 (three) times daily with meals. 5 mL 0  . insulin glargine (LANTUS) 100 UNIT/ML injection Inject 0.42 mLs (42 Units total) into the skin at bedtime. 13 mL 0  . ipratropium (ATROVENT) 0.06 % nasal spray Place 1 spray into both nostrils daily after supper. 3 mL 0  . lidocaine (LIDODERM) 5 % Place 1 patch onto the skin daily. Remove & Discard patch within 12 hours or as directed by MD 30 patch 0  . lisinopril (PRINIVIL,ZESTRIL) 20 MG tablet Take 1 tablet (20 mg total) by mouth daily. 30 tablet 0  . meloxicam (MOBIC) 7.5 MG tablet Take 1 tablet (7.5 mg total) by mouth 2 (two) times daily. 60 tablet 0  . metFORMIN (GLUCOPHAGE) 1000 MG tablet Take 1 tablet (1,000 mg total) by mouth 2 (two) times daily with a meal. 60 tablet 0  . montelukast (SINGULAIR) 10 MG tablet Take  1 tablet (10 mg total) by mouth at bedtime. For Asthma 30 tablet 0  . polyethylene glycol (MIRALAX / GLYCOLAX) packet Take 17 g by mouth daily. 30 each 0  . propranolol (INDERAL) 20 MG tablet Take 1 tablet (20 mg total) by mouth 2 (two) times daily. 60 tablet 0  . simvastatin (ZOCOR) 40 MG tablet Take 1 tablet (40 mg total) by mouth at bedtime. For high cholesterol 15 tablet 0    Results for orders placed or performed during the hospital encounter of 11/06/16 (from the past 48 hour(s))  Glucose, capillary     Status: None   Collection Time: 12/05/16 11:37 AM  Result Value Ref Range   Glucose-Capillary 88 65 - 99 mg/dL  Glucose, capillary     Status: Abnormal   Collection Time: 12/05/16  4:17 PM  Result Value Ref Range   Glucose-Capillary 302 (H) 65 - 99 mg/dL  Glucose, capillary     Status: Abnormal   Collection Time: 12/05/16  9:09 PM  Result Value Ref Range   Glucose-Capillary 313 (H) 65 - 99 mg/dL  Glucose, capillary     Status: Abnormal   Collection Time: 12/06/16  6:50 AM  Result Value Ref Range   Glucose-Capillary 131 (H) 65 - 99 mg/dL  Glucose, capillary     Status: Abnormal   Collection Time: 12/06/16 11:32  AM  Result Value Ref Range   Glucose-Capillary 260 (H) 65 - 99 mg/dL  Glucose, capillary     Status: Abnormal   Collection Time: 12/06/16  4:18 PM  Result Value Ref Range   Glucose-Capillary 256 (H) 65 - 99 mg/dL  Glucose, capillary     Status: Abnormal   Collection Time: 12/06/16  8:48 PM  Result Value Ref Range   Glucose-Capillary 278 (H) 65 - 99 mg/dL  Glucose, capillary     Status: Abnormal   Collection Time: 12/07/16  6:45 AM  Result Value Ref Range   Glucose-Capillary 173 (H) 65 - 99 mg/dL   No results found.  Review of Systems  Constitutional: Negative.   HENT: Negative.   Eyes: Negative.   Respiratory: Negative.   Cardiovascular: Negative.   Gastrointestinal: Negative.   Musculoskeletal: Negative.   Skin: Negative.   Neurological: Negative.    Psychiatric/Behavioral: Negative.     Blood pressure 117/83, pulse (!) 59, temperature 98.1 F (36.7 C), temperature source Oral, resp. rate 18, height 5\' 8"  (1.727 m), weight 179 lb (81.2 kg), SpO2 99 %. Physical Exam  Nursing note and vitals reviewed. Constitutional: He appears well-developed and well-nourished.  HENT:  Head: Normocephalic and atraumatic.  Eyes: Pupils are equal, round, and reactive to light. Conjunctivae are normal.  Neck: Normal range of motion.  Cardiovascular: Regular rhythm and normal heart sounds.   Respiratory: Effort normal. No respiratory distress.  GI: Soft.  Musculoskeletal: Normal range of motion.  Neurological: He is alert.  Skin: Skin is warm and dry.  Psychiatric: Judgment normal. His affect is blunt. His speech is delayed. He is slowed. Thought content is not paranoid. He expresses no homicidal and no suicidal ideation. He exhibits abnormal recent memory.     Assessment/Plan Today's treatment #7 and he has shown significant improvement such that we are anticipating discharge within the next day or so.  , MD 12/07/2016, 10:32 AM

## 2016-12-07 NOTE — Progress Notes (Signed)
Pt appeared to sleep most of the night while monitored on 15 minute safety checks. NPO after midnight for ECT.

## 2016-12-07 NOTE — Anesthesia Procedure Notes (Signed)
Procedure Name: General with mask airway Date/Time: 12/07/2016 10:40 AM Performed by: Ginger Carne Pre-anesthesia Checklist: Patient identified, Timeout performed, Emergency Drugs available, Suction available and Patient being monitored Patient Re-evaluated:Patient Re-evaluated prior to induction Oxygen Delivery Method: Circle system utilized Preoxygenation: Pre-oxygenation with 100% oxygen Induction Type: IV induction Ventilation: Mask ventilation without difficulty Dental Injury: Teeth and Oropharynx as per pre-operative assessment

## 2016-12-07 NOTE — Progress Notes (Signed)
[facility] MD Progress Note  12/07/2016 6:53 PM Con Arganbright  MRN:  [id number]  Subjective:  Mr. Adelsberger is a 41 year old male with prior diagnosis of schizoaffective disorder. He received ECT to be continued next week if appropriate.  He was started on Clozapine and his dose was titrated to 225 mg. He is also on Luvox. Clozapine level was quite high with resulting sedation, because of the combination. Unfortunately in ERROR his Clozapine was discotoniued after Monday. I restarted 100 mg of Clozapine tonight. Actually, he is doing well without it, less sedated. Not knowing he was off Clozapine I took another level Thursday night.  11/08/16. The patient has been very isolative and withdrawn. Hygiene is poor and he is malodorous. He has not showered. He did go outside with the other patients and has been getting up for meals but not attending groups or interacting with staff or peers. He denies any current active or passive suicidal thoughts but does admit to some very vague paranoid thoughts that others are watching him. He is very limited and talking with this Probation officer and wanted to be "left alone to sleep". He denied any current somatic complaints. Vital signs are stable and he slept fairly well last night but is also sleeping partially during the daytime. So far, he is tolerating the Clozaril fairly well without any physical adverse side effects.  Follow-up for this 41 year old man with what appears to be psychotic depression or schizoaffective disorder. Patient remains in bed most of the time. Poorly interactive. Mood is depressed. Passive suicidal thoughts.  Follow-up for Thursday the 21st. Patient remains withdrawn very little activity. Blood sugars are also running much higher. Patient continues to endorse feeling depressed and having vague suicidal thoughts.  Follow-up for Friday the 22nd. Patient finally had ECT this morning bilateral treatment which went off without any difficulty or complication. I saw him  this afternoon and I think he actually was a little more interactive and verbal and energetic than before. This may be wishful thinking but it makes me optimistic. No specific complaints other than headache  Patient was seen today for follow-up. Patient has no new complaints. Continues to say his thoughts are racing but his affect is flat and withdrawn he is almost completely withdrawn from other people and stays in his room by himself. Suicidal thoughts passive no intention or plan. Still has psychotic symptoms. Blood sugars unfortunately continued to run quite high.  6/23 patient denies having any issues or concerns today. Says that he has been eating and sleeping well. Denies side effects from medications or any physical complaints. Denies suicidality or homicidality. He says that he might be hallucinating but he is not sure. He said that he had 1 ECT procedure and it went well.   Blood glucose this morning was 92. Patient is compliant with medications. No major events overnight per nursing.  6/24 patient did not sleep well last night. Other than that he denies having any issues or concerns. He denies suicidality, homicidality or auditory visual hallucinations. During examination he appears much brighter and more conversational. His thought processes are linear.  11/16/2016. Mr. Salce does not like talking to me today. He is in his room he is in bed He does not open his eyes for me and is unable to keep a conversation he keeps telling me okay okay and is unwilling to answer any questions. He received just one ECT treatment last week. Since Dr. Weber Cooks is on vacation, ECT will resume next week.  He  accepts medications and seems to tolerate them well Clozaril level is over 700. This is probably from a combination of clozapine and Luvox. I will alert dose of clozapine to 200 mg  To avoid side effects. I will switch to Luvox to 1 nightly dose to improve sleep.  11/17/2016. [name] is much more animated and  interactive today. He is in his room in bed bugs is able to hold a conversation and maintains good eye contact. He complains of stomach ache. He did have a bowel movement this morning. His mood is still depressed and he needs much encouragement to attend to his ADLs. Staff needs to strongly encourage showering. He has high hopes for ECT and will not stay in the hospital until next week to resume ECT with Dr. Weber Cooks Is back from vacation. Yesterday he is agreeable to the nighttime and clozapine dose decrease due to high clozapine level. This could have helped the patient stated more awake this morning. He slept almost 6 hours.  11/18/2016. [name] seems more relaxed and talkative today. He is still very flat on his back but opens his eyes for me to we are having a small conversation. He has not been sleeping well. There are in spite of medication adjustments. He believes that trazodone works well for him. I will start trazodone tonight. She no longer complains of stomach pain. He does not participate in programming but takes medications with no problem. She met with the group home representative this week and was accepted to a group home with a bed open at the beginning of July. When asked returns from vacation, he will decide whether to continue ECT or whether the patient will be discharged.  11/19/2016. [name] seems better today. This morning he has been up and walking around. He complains of "tension" in his head both mental and physical. He slept 3 hours only again in spite of multiple sedating medications given at night including Trazodone. I will switch him to Restoril tonight. It may need to be discontinued if her restarts ECT next week. No other somatic complaints. Sugers still elevated at times. I changed diet to ADA and discontinued Glucerna. Input from diabetes nurse coordinator and medicine consultant is greatly appreciated. He does not interact with staff/peers or participate in programing. Hygiene  is still questionable.  11/20/2016. [name] is up and about today. He comes to my office for interview. He seems more relaxed today. Still very vague in his answers. Complains of back and neck pain but has been in bed on his back most of the week. His sugar was low today as he was switched to ADA diet and Glucerna was discontinued upon [facility] suggestion. He is looking forward to see Dr. Weber Cooks "who is the best". He corrects himself to tell me that I am not bad either.  Follow-up for June 30. Patient seen. Spoke with nursing. Patient continues to report feeling bad. Mood feels depressed. Denies acute suicidal thoughts but feels hopeless about the future. Feels paranoid and vaguely report psychosis. Patient continues to have poor self-care and poor interaction with others on the unit. Physically he seems to be stabilizing a little.  Follow-up Sunday, July 1. Patient seen. He looks a little better groomed today. He has been up out of his bed although he does not attend any groups. He is ambivalent about his mood today. Denies acute hallucinations or suicidal thinking. Still feels hopeless about discharge.  Patient seen for follow-up on Monday, July 2. He  had ECT treatment this morning which was complicated by some delirium and agitation during the recovery phase. Otherwise treatment went fine. Seen this afternoon the patient is still a little groggy from the medicines he received. No specific physical complaints however. Mood is still depressed and he is still slow and cognitively impaired. Denies acute suicidal intent. Sugars continue to be very labile. He is requesting a restart of his Glucerna but I am unsure whether that is appropriate.  [place] Tuesday, July 3. Patient has no new complaints. In fact today he seems to think he is doing a little bit better. His affect looked brighter and he was more conversant with me. He is taking some steps to improve his appearance.  Follow-up for Wednesday, July 4.  Patient seen. His only complaint today is wanting to have the dietary supplement Glucerna shake back. He thinks his mood is feeling better. He lets me know that he has been out of bed watching TV and being around people. Hallucinations are minimal. Denies suicidal thoughts. Admits that he is feeling a little more optimistic about going home with his family.  Follow-up for Thursday, July 5. Patient says he is feeling a little better than yesterday. Still feels anxious much of the time. He has made the effort to go to groups a little bit. Thought still are scattered and a little disorganized. Stays withdrawn most of the time. Denies however having active suicidal thoughts. Sugars continue to be labile but nothing terrible  Follow-up for Friday the sixth. Patient had ECT today which was tolerated well although he still has some agitation during recovery. Overall however his mood is improving. He is more interactive makes better eye contact and has managed to get up and be around other patients better. Denies any suicidal thoughts currently. Still feels confused and gets very nervous but denies he's having hallucinations.  11/28/2016- Patient continues to report being depressed. Has poor hygiene. Patient with strong malodor. He has not been attending any programming. Denies suicidal thoughts. Patient had 3 ECT treatments so far.  11/29/2016 Patient sleeping in his room. Continues to just lie in his bed, not attending to his hygiene despite his nurse coaxing him. His room has been cleaned and all sheets changed.   Follow-up for Monday, July 9. Patient had ECT treatment this morning. Bilateral treatment tolerated well recovered without difficulty. Seen this afternoon he is a little bit tired but says that overall he thinks he is getting better. Still stays pretty isolated. Hygiene fortunately has improved over the weekend.  Follow-up for Tuesday, July 10. Patient seen this afternoon. Unfortunately he is still  in bed. It is rare for me to ever catch him out of bed or interacting. He claims that he has gone to some groups however. He says his mood is feeling better and he denies any hallucinations. He does admit to still feeling anxious and worried about how he is going to function when he goes home to stay with his mother. Blood sugars are actually looking a little bit more stable thank you very much to the hospitalists.  Follow-up note for Thursday the 12th. Patient seen. He actually was up watching television this evening. He smiled and said that he was feeling much better. Seems to be actually starting to show some serious improvement. Denies having any hallucinations or suicidal thoughts today. We discussed the possibility of discharge however and he is hesitant because he wants to be as good as possible when he is discharged. Sugars are  staying reasonably good given his history Follow-up for Friday the 13th. Patient had bilateral ECT today without complication. This afternoon he reports he is actually feeling pretty good. Once again he is up beat and interacting with others and smiling.  Follow-up on Monday the 16th. Patient had ECT today and appeared to tolerate it well. Affect and mood continued to see him clearly improved. On the other hand his score on the Mini-Mental status exam has gone down and he seems to be losing some concentration and memory. I think at this point he probably has reached maximum benefit from index course of ECT.  Per nursing: Pt calm, cooperative and pleasant.  AM BS 133.  Speech is more clear and logical, though remains hypoverbal.  Remains frequently isolative to room.  No behavioral issues.     Principal Problem: Schizoaffective disorder, depressive type ([place]) Diagnosis:   Patient Active Problem List   Diagnosis Date Noted  . Schizoaffective disorder, depressive type ([place]) [F25.1] 09/23/2016  . Polysubstance abuse [F19.10] 07/17/2016  . Tardive dyskinesia [G24.01]  07/16/2016  . Tobacco use disorder [F17.200] 07/07/2016  . Dyslipidemia [E78.5] 07/07/2016  . Asthma [J45.909] 07/07/2016  . HTN (hypertension) [I10] 07/06/2016  . Diabetes ([place]) [E11.9] 12/25/2010   Total Time spent with patient: 20 minutes    Past Medical History:  Past Medical History:  Diagnosis Date  . Anxiety   . Asthma   . Diabetes mellitus   . High blood pressure   . Sinus complaint    History reviewed. No pertinent surgical history.  Social History:  History  Alcohol Use No     History  Drug Use No    Social History   Social History  . Marital status: Single    Spouse name: [name]  . Number of children: [name]  . Years of education: [name]   Social History Main Topics  . Smoking status: Current Every Day Smoker    Packs/day: 0.50    Types: Cigarettes  . Smokeless tobacco: Never Used  . Alcohol use No  . Drug use: No  . Sexual activity: Not Currently   Other Topics Concern  . None   Social History Narrative  . None   Additional Social History:                         Sleep: Good  Appetite:  Good  Current Medications: Current Facility-Administered Medications  Medication Dose Route Frequency Provider Last Rate Last Dose  . acetaminophen (TYLENOL) tablet 650 mg  650 mg Oral Q6H PRN Yonatan Guitron, Madie Reno, MD   650 mg at 11/19/16 1739  . alum & mag hydroxide-simeth (MAALOX/MYLANTA) 200-200-20 MG/5ML suspension 30 mL  30 mL Oral Q4H PRN Jaiel Saraceno T, MD      . cloZAPine (CLOZARIL) tablet 100 mg  100 mg Oral QHS [name], [name], MD   100 mg at 12/06/16 2050  . dextrose 5 %-0.45 % sodium chloride infusion   Intravenous Continuous Kelson Queenan, Madie Reno, MD 10 mL/hr at 11/13/16 1009    . feeding supplement (GLUCERNA SHAKE) (GLUCERNA SHAKE) liquid 237 mL  237 mL Oral TID WC Anginette Espejo T, MD   237 mL at 12/07/16 1700  . fluvoxaMINE (LUVOX) tablet 100 mg  100 mg Oral QHS [name], [name], MD   100 mg at 12/06/16 2050  . glycopyrrolate (ROBINUL)  injection 0.2 mg  0.2 mg Intravenous Once Betzaida Cremeens, Madie Reno, MD      . insulin  aspart (novoLOG) injection 0-15 Units  0-15 Units Subcutaneous TID WC Loletha Grayer, MD   2 Units at 12/07/16 1239  . insulin aspart (novoLOG) injection 0-5 Units  0-5 Units Subcutaneous QHS Loletha Grayer, MD   3 Units at 12/06/16 2052  . insulin aspart (novoLOG) injection 10 Units  10 Units Subcutaneous TID WC Fritzi Mandes, MD   10 Units at 12/07/16 1716  . insulin glargine (LANTUS) injection 55 Units  55 Units Subcutaneous QHS Fritzi Mandes, MD   55 Units at 12/06/16 2051  . lisinopril (PRINIVIL,ZESTRIL) tablet 20 mg  20 mg Oral Daily Amena Dockham, Madie Reno, MD   20 mg at 12/07/16 [phone]  . magnesium hydroxide (MILK OF MAGNESIA) suspension 30 mL  30 mL Oral Daily PRN Rey Fors T, MD      . meloxicam (MOBIC) tablet 7.5 mg  7.5 mg Oral BID PC Garielle Mroz T, MD   7.5 mg at 12/07/16 1715  . metFORMIN (GLUCOPHAGE) tablet 1,000 mg  1,000 mg Oral BID WC Karsen Nakanishi, Madie Reno, MD   1,000 mg at 12/07/16 1715  . montelukast (SINGULAIR) tablet 10 mg  10 mg Oral QHS Renae Mottley, Madie Reno, MD   10 mg at 12/06/16 2050  . nicotine (NICODERM CQ - dosed in mg/24 hr) patch 7 mg  7 mg Transdermal Daily Chauncey Mann, MD   7 mg at 12/05/16 0744  . pantoprazole (PROTONIX) EC tablet 40 mg  40 mg Oral Daily [name], [name], MD   40 mg at 12/07/16 0643  . polyethylene glycol (MIRALAX / GLYCOLAX) packet 17 g  17 g Oral Daily Shenia Alan, Madie Reno, MD   17 g at 12/05/16 0743  . promethazine (PHENERGAN) tablet 12.5 mg  12.5 mg Oral Q6H PRN Crystelle Ferrufino T, MD      . propranolol (INDERAL) tablet 20 mg  20 mg Oral BID Angla Delahunt, Madie Reno, MD   20 mg at 12/07/16 1715  . simvastatin (ZOCOR) tablet 40 mg  40 mg Oral q1800 Keilyn Haggard, Madie Reno, MD   40 mg at 12/07/16 1715  . temazepam (RESTORIL) capsule 15 mg  15 mg Oral QHS [name], [name], MD   15 mg at 12/06/16 2051    Lab Results:  Results for orders placed or performed during the hospital encounter of 11/06/16  (from the past 48 hour(s))  Glucose, capillary     Status: Abnormal   Collection Time: 12/05/16  9:09 PM  Result Value Ref Range   Glucose-Capillary 313 (H) 65 - 99 mg/dL  Glucose, capillary     Status: Abnormal   Collection Time: 12/06/16  6:50 AM  Result Value Ref Range   Glucose-Capillary 131 (H) 65 - 99 mg/dL  Glucose, capillary     Status: Abnormal   Collection Time: 12/06/16 11:32 AM  Result Value Ref Range   Glucose-Capillary 260 (H) 65 - 99 mg/dL  Glucose, capillary     Status: Abnormal   Collection Time: 12/06/16  4:18 PM  Result Value Ref Range   Glucose-Capillary 256 (H) 65 - 99 mg/dL  Glucose, capillary     Status: Abnormal   Collection Time: 12/06/16  8:48 PM  Result Value Ref Range   Glucose-Capillary 278 (H) 65 - 99 mg/dL  Glucose, capillary     Status: Abnormal   Collection Time: 12/07/16  6:45 AM  Result Value Ref Range   Glucose-Capillary 173 (H) 65 - 99 mg/dL  Glucose, capillary     Status: Abnormal   Collection Time:  12/07/16 10:59 AM  Result Value Ref Range   Glucose-Capillary 139 (H) 65 - 99 mg/dL  Glucose, capillary     Status: Abnormal   Collection Time: 12/07/16 11:52 AM  Result Value Ref Range   Glucose-Capillary 127 (H) 65 - 99 mg/dL  Glucose, capillary     Status: None   Collection Time: 12/07/16  4:31 PM  Result Value Ref Range   Glucose-Capillary 83 65 - 99 mg/dL    Blood Alcohol level:  Lab Results  Component Value Date   ETH <5 10/21/2016   ETH <5 19/50/9326    Metabolic Disorder Labs: Lab Results  Component Value Date   HGBA1C 10.3 (H) 11/09/2016   MPG 249 11/09/2016   MPG 275 10/09/2016   Lab Results  Component Value Date   PROLACTIN 24.5 (H) 09/24/2016   PROLACTIN 3.4 (L) 07/07/2016   Lab Results  Component Value Date   CHOL 92 11/09/2016   TRIG 54 11/09/2016   HDL 39 (L) 11/09/2016   CHOLHDL 2.4 11/09/2016   VLDL 11 11/09/2016   LDLCALC 42 11/09/2016   LDLCALC 72 10/09/2016    Physical Findings: AIMS: Facial and  Oral Movements Muscles of Facial Expression: Mild Lips and Perioral Area: Mild Jaw: Minimal Tongue: None, normal,Extremity Movements Upper (arms, wrists, hands, fingers): Mild Lower (legs, knees, ankles, toes): Mild, Trunk Movements Neck, shoulders, hips: None, normal, Overall Severity Severity of abnormal movements (highest score from questions above): Mild Incapacitation due to abnormal movements: Minimal Patient's awareness of abnormal movements (rate only patient's report): No Awareness, Dental Status Current problems with teeth and/or dentures?: No Does patient usually wear dentures?: No   Musculoskeletal: Strength & Muscle Tone: within normal limits Gait & Station: normal Patient leans: [name]  Psychiatric Specialty Exam: Physical Exam  Nursing note and vitals reviewed. Constitutional: He appears well-developed and well-nourished.  HENT:  Head: Normocephalic and atraumatic.  Eyes: Pupils are equal, round, and reactive to light. Conjunctivae are normal.  Neck: Normal range of motion.  Cardiovascular: Regular rhythm and normal heart sounds.   Respiratory: Effort normal.  GI: Soft.  Musculoskeletal: Normal range of motion.  Neurological: He is alert.  Skin: Skin is warm and dry.  Psychiatric: Judgment normal. His affect is not blunt. His speech is delayed. He is slowed. Thought content is not paranoid and not delusional. Cognition and memory are normal.    Review of Systems  Constitutional: Negative.   HENT: Negative.   Eyes: Negative.   Cardiovascular: Negative.   Gastrointestinal: Negative.   Genitourinary: Negative.   Musculoskeletal: Negative.   Skin: Negative.   Neurological: Negative.   Endo/Heme/Allergies: Negative.   Psychiatric/Behavioral: Negative for depression and hallucinations.    Blood pressure 126/88, pulse 75, temperature (!) 97.3 F (36.3 C), resp. rate 17, height '5\' 8"'$  (1.727 m), weight 179 lb (81.2 kg), SpO2 99 %.Body mass index is 27.22 kg/m.   General Appearance: Disheveled  Eye Contact:  Poor  Speech:  Slow  Volume:  Decreased  Mood:  Dysphoric  Affect:  Blunt  Thought Process:  Linear  Orientation:  Full (Time, Place, and Person)  Thought Content:  Paranoid Ideation  Suicidal Thoughts:  No  Homicidal Thoughts:  No  Memory:  Immediate;   Fair Recent;   Fair Remote;   Fair  Judgement:  Impaired  Insight:  Lacking  Psychomotor Activity:  Decreased  Concentration:  Concentration: Fair and Attention Span: Fair  Recall:  [employer] of Knowledge:  Fair  Language:  Kermit Balo  Akathisia:  No  Handed:  Right  AIMS (if indicated):     Assets:  Housing Physical Health  ADL's:  Intact  Cognition:  WNL  Sleep:  Number of Hours: 7     Treatment Plan Summary:  Mr. Chevalier 41 year old male with prior diagnosis of schizoaffective disorder who presented to the emergency room with vague suicidal thoughts as well as paranoid thoughts. He does. Be responding to internal stimuli and has endorsed some auditory and visual hallucinations. He will be admitted to inpatient psychiatry for medication management, safety and stabilization.  1. Schizoaffective disorder, depressive type.   Continue Clozapine '100mg'$  at bedtime.  2. ECT. Patient had ECT on Friday,11/13/2016 for the first time in this hospitalization.  Patient had 3 ECT treatments so far  3. Metabolic syndrome monitoring. Lipid panel is normal, hemoglobin A1c 103.  4. EKG. Normal sinus rhythm, QTc 417.  5. Questionable history of polysubstance abuse: The patient denies any history of any polysubstance use but records indicate that there are may have been a history of polysubstance use. He was advised to abstain from alcohol and all illicit drugs as it may worsen anxiety mood symptoms as well as psychosis. The patient is not interested in any substance abuse treatment.  6. Nicotine use disorder. Nicotine patch is available.   7. Diabetes. He is on Metformin, Novolog 10 units  with meals and Lantus 55 units, along with ADA diet and blood sugar monitoring. Medicine input is greatly appreciated. Glucerna is discontinued.  8. Hypertension: Vital signs are stable. We'll continue lisinopril 20 mg daily.  9. Seasonal allergies: Continue Singulair 10 mg nightly  10. Anxiety/restlessness. He is on Propranolol.   11. Insomnia. We discontinued Trazodone and started Restoril. Slept 7.5 hours.  12. Back pain. We will give Robaxin.  13. Disposition: The patient will return to a group home. He will follow up with [facility] in [facility]. Consider ACT team.  Patient was requesting that he be discharged tomorrow rather than today. He seems to want to stay in the hospital. Really tried to encourage him to be positive about discharge plan. We are going to schedule follow-up for maintenance treatment on Friday if possible. Alethia Berthold, MD 12/07/2016, 6:53 PM

## 2016-12-08 LAB — CBC WITH DIFFERENTIAL/PLATELET
BASOS ABS: 0.1 10*3/uL (ref 0–0.1)
BASOS PCT: 1 %
EOS ABS: 0.3 10*3/uL (ref 0–0.7)
EOS PCT: 4 %
HCT: 38.1 % — ABNORMAL LOW (ref 40.0–52.0)
HEMOGLOBIN: 13.6 g/dL (ref 13.0–18.0)
Lymphocytes Relative: 37 %
Lymphs Abs: 2.5 10*3/uL (ref 1.0–3.6)
MCH: 31.2 pg (ref 26.0–34.0)
MCHC: 35.7 g/dL (ref 32.0–36.0)
MCV: 87.4 fL (ref 80.0–100.0)
Monocytes Absolute: 0.4 10*3/uL (ref 0.2–1.0)
Monocytes Relative: 6 %
NEUTROS PCT: 52 %
Neutro Abs: 3.5 10*3/uL (ref 1.4–6.5)
PLATELETS: 180 10*3/uL (ref 150–440)
RBC: 4.36 MIL/uL — AB (ref 4.40–5.90)
RDW: 12.8 % (ref 11.5–14.5)
WBC: 6.8 10*3/uL (ref 3.8–10.6)

## 2016-12-08 LAB — GLUCOSE, CAPILLARY: GLUCOSE-CAPILLARY: 332 mg/dL — AB (ref 65–99)

## 2016-12-08 MED ORDER — METFORMIN HCL 1000 MG PO TABS: 1000.0000 mg | ORAL_TABLET | Freq: Two times a day (BID) | ORAL | 1 refills | Status: DC

## 2016-12-08 MED ORDER — PANTOPRAZOLE SODIUM 40 MG PO TBEC: 40.0000 mg | DELAYED_RELEASE_TABLET | Freq: Every day | ORAL | 1 refills | Status: DC

## 2016-12-08 MED ORDER — MONTELUKAST SODIUM 10 MG PO TABS: 10.0000 mg | ORAL_TABLET | Freq: Every day | ORAL | 1 refills | Status: DC

## 2016-12-08 MED ORDER — CLOZAPINE 100 MG PO TABS: 100.0000 mg | ORAL_TABLET | Freq: Every day | ORAL | 1 refills | Status: DC

## 2016-12-08 MED ORDER — SIMVASTATIN 40 MG PO TABS: 40.0000 mg | ORAL_TABLET | Freq: Every day | ORAL | 1 refills | Status: DC

## 2016-12-08 MED ORDER — FLUVOXAMINE MALEATE 100 MG PO TABS: 100.0000 mg | ORAL_TABLET | Freq: Every day | ORAL | 1 refills | Status: DC

## 2016-12-08 MED ORDER — INSULIN ASPART 100 UNIT/ML ~~LOC~~ SOLN: 10.0000 [IU] | Freq: Three times a day (TID) | SUBCUTANEOUS | 11 refills | Status: DC

## 2016-12-08 MED ORDER — TEMAZEPAM 15 MG PO CAPS: 15.0000 mg | ORAL_CAPSULE | Freq: Every day | ORAL | 1 refills | Status: DC

## 2016-12-08 MED ORDER — MELOXICAM 7.5 MG PO TABS: 7.5000 mg | ORAL_TABLET | Freq: Two times a day (BID) | ORAL | 1 refills | Status: DC

## 2016-12-08 MED ORDER — PROPRANOLOL HCL 20 MG PO TABS: 20.0000 mg | ORAL_TABLET | Freq: Two times a day (BID) | ORAL | 1 refills | Status: DC

## 2016-12-08 MED ORDER — LISINOPRIL 20 MG PO TABS: 20.0000 mg | ORAL_TABLET | Freq: Every day | ORAL | 1 refills | Status: DC

## 2016-12-08 MED ORDER — NICOTINE 7 MG/24HR TD PT24: 7.0000 mg | MEDICATED_PATCH | Freq: Every day | TRANSDERMAL | 0 refills | Status: DC

## 2016-12-08 MED ORDER — INSULIN GLARGINE 100 UNIT/ML ~~LOC~~ SOLN: 55.0000 [IU] | Freq: Every day | SUBCUTANEOUS | 11 refills | Status: DC

## 2016-12-08 MED ORDER — POLYETHYLENE GLYCOL 3350 17 G PO PACK: 17.0000 g | PACK | Freq: Every day | ORAL | 1 refills | Status: DC

## 2016-12-08 NOTE — Plan of Care (Signed)
Problem: Activity: Goal: Sleeping patterns will improve Outcome: Progressing Patient slept for Estimated Hours of 7; Precautionary checks every 15 minutes for safety maintained, room free of safety hazards, patient sustains no injury or falls during this shift.    

## 2016-12-08 NOTE — B[facility] Suicide Risk Assessment (Signed)
[facility] Discharge Suicide Risk Assessment   Principal Problem: Schizoaffective disorder, depressive type [facility]) Discharge Diagnoses:  Patient Active Problem List   Diagnosis Date Noted  . Schizoaffective disorder, depressive type (HCC) [F25.1] 09/23/2016  . Polysubstance abuse [F19.10] 07/17/2016  . Tardive dyskinesia [G24.01] 07/16/2016  . Tobacco use disorder [F17.200] 07/07/2016  . Dyslipidemia [E78.5] 07/07/2016  . Asthma [J45.909] 07/07/2016  . HTN (hypertension) [I10] 07/06/2016  . Diabetes (HCC) [E11.9] 12/25/2010    Total Time spent with patient: 45 minutes  Musculoskeletal: Strength & Muscle Tone: within normal limits Gait & Station: normal Patient leans: [name]  Psychiatric Specialty Exam: Review of Systems  Constitutional: Negative.   HENT: Negative.   Eyes: Negative.   Respiratory: Negative.   Cardiovascular: Negative.   Gastrointestinal: Negative.   Musculoskeletal: Negative.   Skin: Negative.   Neurological: Negative.   Psychiatric/Behavioral: Positive for memory loss. Negative for depression, hallucinations, substance abuse and suicidal ideas. The patient is not nervous/anxious and does not have insomnia.     Blood pressure 118/75, pulse 75, temperature 97.7 F (36.5 C), resp. rate 17, height 5\' 8"  (1.727 m), weight 179 lb (81.2 kg), SpO2 99 %.Body mass index is 27.22 kg/m.  General Appearance: Casual  Eye Contact::  Good  Speech:  Slow409  Volume:  Decreased  Mood:  Euthymic  Affect:  Constricted  Thought Process:  Goal Directed  Orientation:  Full (Time, Place, and Person)  Thought Content:  Logical  Suicidal Thoughts:  No  Homicidal Thoughts:  No  Memory:  Immediate;   Good Recent;   Fair Remote;   Fair  Judgement:  Fair  Insight:  Fair  Psychomotor Activity:  Normal  Concentration:  Fair  Recall:  [id number] of Knowledge:Fair  Language: Fair  Akathisia:  No  Handed:  Right  AIMS (if indicated):     Assets:  Communication Skills Desire for  Improvement Physical Health Resilience Social Support  Sleep:  Number of Hours: 7  Cognition: Impaired,  Mild  ADL's:  Intact   Mental Status Per Nursing Assessment::   On Admission:  NA  Demographic Factors:  Male and Unemployed  Loss Factors: Decline in physical health and Financial problems/change in socioeconomic status  Historical Factors: NA  Risk Reduction Factors:   Sense of responsibility to family, Living with another person, especially a relative, Positive social support and Positive therapeutic relationship  Continued Clinical Symptoms:  Depression:   Anhedonia Schizophrenia:   Paranoid or undifferentiated type  Cognitive Features That Contribute To Risk:  Thought constriction (tunnel vision)    Suicide Risk:  Mild:  Suicidal ideation of limited frequency, intensity, duration, and specificity.  There are no identifiable plans, no associated intent, mild dysphoria and related symptoms, good self-control (both objective and subjective assessment), few other risk factors, and identifiable protective factors, including available and accessible social support.  Follow-up Information    [facility] ACTT. Go on 12/10/2016.   Why:  12/12/2016 from [facility] Recovery ACT Team to meet at your house Thursday, July 19th at [facility] for medication management and therapy assessment. Please have discharge paperwork available.  Contact information: Address: [address]  [place], [place] Phone: [phone] Fax: [phone]       Dr. [phone] for ECT. Go on 12/11/2016.   Why:  Please arrive to your ECT appointment with Dr. 12/13/2016 on Monday, July 20th at 8:30AM. You will need to continue treatment every Monday, Wednesday, and Friday until Dr. Monday make changes Contact  information: Hospital: [facility] 2nd floor [address] [place], [place] [address] ph# [phone]          Plan Of Care/Follow-up recommendations:  Activity:  Activity as  tolerated. Encourage him to have regular levels of social and physical activity Diet:  Diabetic diet Other:  Patient is to return for ECT treatment on Friday as an outpatient. He is to follow-up with outpatient treatment with [facility] and hasn't act team. Reviewed the whole plan with patient.  Mordecai Rasmussen, MD 12/08/2016, 9:41 AM

## 2016-12-08 NOTE — Progress Notes (Signed)
  [facility] Adult Case Management Discharge Plan :  Will you be returning to the same living situation after discharge:  No. At discharge, do you have transportation home?: Yes,  sister in law Do you have the ability to pay for your medications: Yes,  Medicaid/Medicare  Release of information consent forms completed and in the chart;  Patient's signature needed at discharge.  Patient to Follow up at: Follow-up Information    [facility]. Go on 12/10/2016.   Why:  Waldon Merl from [facility] Recovery ACT Team to meet at your house Thursday, July 19th at [facility] for medication management and therapy assessment. Please have discharge paperwork available.  Contact information: Address: [address]  [place], [place] [address] Phone: [phone] Fax: [phone]       Dr. Toni Amend for ECT. Go on 12/11/2016.   Why:  Please arrive to your ECT appointment with Dr. Toni Amend on Monday, July 20th at 8:30AM. You will need to continue treatment every Monday, Wednesday, and Friday until Dr. Toni Amend make changes Contact information: Hospital: Medical Mall 2nd floor [address] [place], [place] [address] ph# [phone]          Next level of care provider has access to [facility] Link:no  Safety Planning and Suicide Prevention discussed: Yes,  SPE completed with patient and sister in law  Have you used any form of tobacco in the last 30 days? (Cigarettes, Smokeless Tobacco, Cigars, and/or Pipes): Patient Refused Screening  Has patient been referred to the Quitline?: Patient refused referral  Patient has been referred for addiction treatment: [name]  Lynden Oxford, MSW, LCSW-A 12/08/2016, 10:20 AM

## 2016-12-08 NOTE — Discharge Summary (Signed)
Physician Discharge Summary Note  Patient:  Tony Cunningham is an 41 y.o., male MRN:  [id number] DOB:  11/08/1975 Patient phone:  [phone] (home)  Patient address:   [address] [place] [address],  Total Time spent with patient: 45 minutes  Date of Admission:  11/06/2016 Date of Discharge: 12/08/2016  Reason for Admission:  Patient was schizoaffective disorder admitted through the emergency room where he had returned with worsening depression, hallucinations, suicidal ideation, severe social and mental withdrawal. Admitted to the hospital in part specifically to initiate ECT treatment for his ongoing condition  Principal Problem: Schizoaffective disorder, depressive type [facility]) Discharge Diagnoses: Patient Active Problem List   Diagnosis Date Noted  . Schizoaffective disorder, depressive type (HCC) [F25.1] 09/23/2016  . Polysubstance abuse [F19.10] 07/17/2016  . Tardive dyskinesia [G24.01] 07/16/2016  . Tobacco use disorder [F17.200] 07/07/2016  . Dyslipidemia [E78.5] 07/07/2016  . Asthma [J45.909] 07/07/2016  . HTN (hypertension) [I10] 07/06/2016  . Diabetes (HCC) [E11.9] 12/25/2010    Past Psychiatric History: Patient has a long history of mental health problems with several prior hospitalizations. He also has a history of suicide attempts. Patient has had several back-to-back hospitalizations this year with a failure to return to baseline. Diagnosis to my mind most likely schizoaffective disorder versus severe recurrent psychotic depression  Past Medical History:  Past Medical History:  Diagnosis Date  . Anxiety   . Asthma   . Diabetes mellitus   . High blood pressure   . Sinus complaint    History reviewed. No pertinent surgical history. Family History: History reviewed. No pertinent family history. Family Psychiatric  History: Nonidentified Social History:  History  Alcohol Use No     History  Drug Use No    Social History   Social History  . Marital  status: Single    Spouse name: [name]  . Number of children: [name]  . Years of education: [name]   Social History Main Topics  . Smoking status: Current Every Day Smoker    Packs/day: 0.50    Types: Cigarettes  . Smokeless tobacco: Never Used  . Alcohol use No  . Drug use: No  . Sexual activity: Not Currently   Other Topics Concern  . None   Social History Narrative  . None    Hospital Course:  Patient was admitted to the psychiatric ward and was counseled about the recommendation for ECT treatment. He was agreeable to the plan and has now had 7 bilateral ECT treatments. Patient has shown significant improvement. Energy level and activity has improved specifically over the last several days. He has shown a brighter affect. He has attended groups. He has been smiling more and eating better. Patient has not shown any suicidal behavior. Has not been aggressive. Has been cooperative with treatment. As far as his diabetes he continues to have brittle and difficult to control blood sugars. Appreciate very much medicine consult. Insulin has been adjusted as well as other medicines and we have shown some improvement. Patient is now going to be discharged with the plan to stay with his family in the [place] area. [facility] although we are going to see whether maintenance ECT at least for the foreseeable future might be a additional option.  Physical Findings: AIMS: Facial and Oral Movements Muscles of Facial Expression: Mild Lips and Perioral Area: Mild Jaw: Minimal Tongue: None, normal,Extremity Movements Upper (arms, wrists, hands, fingers): Mild Lower (legs, knees, ankles, toes): Mild, Trunk Movements Neck, shoulders, hips: None, normal,  Overall Severity Severity of abnormal movements (highest score from questions above): Mild Incapacitation due to abnormal movements: Minimal Patient's awareness of abnormal movements (rate only patient's report): No Awareness, Dental  Status Current problems with teeth and/or dentures?: No Does patient usually wear dentures?: No  CIWA:    COWS:     Musculoskeletal: Strength & Muscle Tone: within normal limits Gait & Station: normal Patient leans: [name]  Psychiatric Specialty Exam: Physical Exam  Nursing note and vitals reviewed. Constitutional: He appears well-developed and well-nourished.  HENT:  Head: Normocephalic and atraumatic.  Eyes: Pupils are equal, round, and reactive to light. Conjunctivae are normal.  Neck: Normal range of motion.  Cardiovascular: Regular rhythm and normal heart sounds.   Respiratory: Effort normal. No respiratory distress.  GI: Soft.  Musculoskeletal: Normal range of motion.  Neurological: He is alert.  Skin: Skin is warm and dry.  Psychiatric: Judgment normal. His affect is blunt. His speech is delayed. He is slowed. Thought content is not paranoid. He expresses no homicidal and no suicidal ideation. He exhibits abnormal recent memory.    Review of Systems  Constitutional: Negative.   HENT: Negative.   Eyes: Negative.   Respiratory: Negative.   Cardiovascular: Negative.   Gastrointestinal: Negative.   Musculoskeletal: Negative.   Skin: Negative.   Neurological: Negative.   Psychiatric/Behavioral: Positive for memory loss. Negative for depression, hallucinations, substance abuse and suicidal ideas. The patient is not nervous/anxious and does not have insomnia.     Blood pressure 118/75, pulse 75, temperature 97.7 F (36.5 C), resp. rate 17, height 5\' 8"  (1.727 m), weight 179 lb (81.2 kg), SpO2 99 %.Body mass index is 27.22 kg/m.  General Appearance: Casual  Eye Contact:  Good  Speech:  Slow  Volume:  Decreased  Mood:  Euthymic  Affect:  Constricted  Thought Process:  Goal Directed  Orientation:  Full (Time, Place, and Person)  Thought Content:  Logical  Suicidal Thoughts:  No  Homicidal Thoughts:  No  Memory:  Immediate;   Fair Recent;   Fair Remote;   Fair   Judgement:  Fair  Insight:  Fair  Psychomotor Activity:  Decreased  Concentration:  Concentration: Fair  Recall:  Fair  Fund of Knowledge:  Fair  Language:  Fair  Akathisia:  No  Handed:  Right  AIMS (if indicated):     Assets:  Desire for Improvement Financial Resources/Insurance Housing Social Support  ADL's:  Intact  Cognition:  Impaired,  Mild  Sleep:  Number of Hours: 7     Have you used any form of tobacco in the last 30 days? (Cigarettes, Smokeless Tobacco, Cigars, and/or Pipes): Patient Refused Screening  Has this patient used any form of tobacco in the last 30 days? (Cigarettes, Smokeless Tobacco, Cigars, and/or Pipes) Yes, Yes, A prescription for an FDA-approved tobacco cessation medication was offered at discharge and the patient refused  Blood Alcohol level:  Lab Results  Component Value Date   [facility] <5 10/21/2016   ETH <5 10/07/2016    Metabolic Disorder Labs:  Lab Results  Component Value Date   HGBA1C 10.3 (H) 11/09/2016   MPG 249 11/09/2016   MPG 275 10/09/2016   Lab Results  Component Value Date   PROLACTIN 24.5 (H) 09/24/2016   PROLACTIN 3.4 (L) 07/07/2016   Lab Results  Component Value Date   CHOL 92 11/09/2016   TRIG 54 11/09/2016   HDL 39 (L) 11/09/2016   CHOLHDL 2.4 11/09/2016   VLDL 11 11/09/2016  LDLCALC 42 11/09/2016   LDLCALC 72 10/09/2016    See Psychiatric Specialty Exam and Suicide Risk Assessment completed by Attending Physician prior to discharge.  Discharge destination:  Home  Is patient on multiple antipsychotic therapies at discharge:  No   Has Patient had three or more failed trials of antipsychotic monotherapy by history:  No  Recommended Plan for Multiple Antipsychotic Therapies: NA  Discharge Instructions    Diet - low sodium heart healthy    Complete by:  As directed    Increase activity slowly    Complete by:  As directed      Allergies as of 12/08/2016   No Known Allergies     Medication List    STOP  taking these medications   cyclobenzaprine 10 MG tablet Commonly known as:  FLEXERIL   ipratropium 0.06 % nasal spray Commonly known as:  ATROVENT   lidocaine 5 % Commonly known as:  LIDODERM     TAKE these medications     Indication  cloZAPine 100 MG tablet Commonly known as:  CLOZARIL Take 1 tablet (100 mg total) by mouth at bedtime. What changed:  when to take this  additional instructions  Indication:  Schizoaffective Disorder   fluvoxaMINE 100 MG tablet Commonly known as:  LUVOX Take 1 tablet (100 mg total) by mouth at bedtime. What changed:  medication strength  how much to take  when to take this  additional instructions  Indication:  Depression   insulin aspart 100 UNIT/ML injection Commonly known as:  novoLOG Inject 10 Units into the skin 3 (three) times daily with meals. What changed:  how much to take  Indication:  Insulin-Dependent Diabetes   insulin glargine 100 UNIT/ML injection Commonly known as:  LANTUS Inject 0.55 mLs (55 Units total) into the skin at bedtime. What changed:  how much to take  Indication:  Insulin-Dependent Diabetes   lisinopril 20 MG tablet Commonly known as:  PRINIVIL,ZESTRIL Take 1 tablet (20 mg total) by mouth daily.  Indication:  High Blood Pressure Disorder   meloxicam 7.5 MG tablet Commonly known as:  MOBIC Take 1 tablet (7.5 mg total) by mouth 2 (two) times daily after a meal. What changed:  when to take this  Indication:  Rheumatoid Arthritis   metFORMIN 1000 MG tablet Commonly known as:  GLUCOPHAGE Take 1 tablet (1,000 mg total) by mouth 2 (two) times daily with a meal.  Indication:  Type 2 Diabetes   montelukast 10 MG tablet Commonly known as:  SINGULAIR Take 1 tablet (10 mg total) by mouth at bedtime. What changed:  additional instructions  Indication:  Asthma   nicotine 7 mg/24hr patch Commonly known as:  NICODERM CQ - dosed in mg/24 hr Place 1 patch (7 mg total) onto the skin daily.  Indication:   Nicotine Addiction   pantoprazole 40 MG tablet Commonly known as:  PROTONIX Take 1 tablet (40 mg total) by mouth daily.  Indication:  Gastroesophageal Reflux Disease   polyethylene glycol packet Commonly known as:  MIRALAX / GLYCOLAX Take 17 g by mouth daily.  Indication:  Constipation   propranolol 20 MG tablet Commonly known as:  INDERAL Take 1 tablet (20 mg total) by mouth 2 (two) times daily.  Indication:  High Blood Pressure Disorder   simvastatin 40 MG tablet Commonly known as:  ZOCOR Take 1 tablet (40 mg total) by mouth daily at 6 PM. What changed:  when to take this  additional instructions  Indication:  High Amount of  Triglycerides in the Blood   temazepam 15 MG capsule Commonly known as:  RESTORIL Take 1 capsule (15 mg total) by mouth at bedtime.  Indication:  Trouble Sleeping      Follow-up Information    [facility] ACT[facility]. Go on 12/10/2016.   Why:  Waldon Merl from [facility] Recovery ACT Team to meet at your house Thursday, July 19th at [facility] for medication management and therapy assessment. Please have discharge paperwork available.  Contact information: Address: [address]  [place], [place] [address] Phone: [phone] Fax: [phone]       Dr. Toni Amend for ECT. Go on 12/11/2016.   Why:  Please arrive to your ECT appointment with Dr. Toni Amend on Monday, July 20th at 8:30AM. You will need to continue treatment every Monday, Wednesday, and Friday until Dr. Toni Amend make changes Contact information: Hospital: Medical Mall 2nd floor [address] [place], [place] [address] ph# [phone]          Follow-up recommendations:  Activity:  Activity as tolerated with recommendation that he gradually increased to normal physical and social activity Diet:  Diabetic diet Other:  Patient is requested to return to outpatient ECT on Friday after which we will make a further plan depending on what is available with his family  Comments:  Patient  is now on clozapine. Clozapine level was elevated possibly because of the use of Luvox. Clozapine dose was decreased dramatically and that seems to of improved his sedation. Psychotic symptoms much improved. No sign of acute dangerousness. Discontinue involuntary commitment discharge from the hospital today. Case reviewed with social work and nursing and patient  Signed: Mordecai Rasmussen, MD 12/08/2016, 9:45 AM

## 2016-12-08 NOTE — Anesthesia Postprocedure Evaluation (Signed)
Anesthesia Post Note  Patient: Tony Cunningham  Procedure(s) Performed: * No procedures listed *  Patient location during evaluation: PACU Anesthesia Type: General Level of consciousness: awake and alert Pain management: pain level controlled Vital Signs Assessment: post-procedure vital signs reviewed and stable Respiratory status: spontaneous breathing, nonlabored ventilation, respiratory function stable and patient connected to nasal cannula oxygen Cardiovascular status: blood pressure returned to baseline and stable Postop Assessment: no signs of nausea or vomiting Anesthetic complications: no     Last Vitals:  Vitals:   12/07/16 1135 12/08/16 0708  BP: 126/88 118/75  Pulse: 75 75  Resp:    Temp: (!) 36.3 C 36.5 C    Last Pain:  Vitals:   12/08/16 0900  TempSrc:   PainSc: 0-No pain                 Norris Brumbach S

## 2016-12-08 NOTE — Progress Notes (Signed)
MEDICATION RELATED CONSULT NOTE - FOLLOW UP   Pharmacy Consult for Clozapine Monitoring Indication: Schizophrenia  No Known Allergies  Patient Measurements: Height: 5\' 8"  (172.7 cm) Weight: 179 lb (81.2 kg) IBW/kg (Calculated) : 68.4   Vital Signs: Temp: 97.7 F (36.5 C) (07/17 0708) BP: 118/75 (07/17 0708) Pulse Rate: 75 (07/17 0708) Labs:  Recent Labs  12/08/16 0650  WBC 6.8  HGB 13.6  HCT 38.1*  PLT 180   CrCl cannot be calculated (Patient's most recent lab result is older than the maximum 21 days allowed.).   Plan:  Patient eligible for dispensing per [facility] site. Reported todays ANC 3600 to site. Next ANC due:  July 17  7/17: ANC 3.5. Patient eligible for dispensing per [facility] site. Will follow-up with labs in one week on 7/24.   8/24, PharmD Pharmacy Resident 12/08/16 [phone]

## 2016-12-08 NOTE — Progress Notes (Signed)
D: Patient is aware of  Discharge this shift .Patient denies suicidal /homicidal ideations. Patient received all belongings brought in  A: No Storage medications. Writer reviewed Discharge Summary, Suicide Risk Assessment, and Transitional Record. Patient also received Prescriptions   from  MD.and prescriptions   . Aware  Of follow up appointment Friday for ECT. R: Patient left unit with no questions  Or concerns  With family

## 2016-12-08 NOTE — Progress Notes (Signed)
Inpatient Diabetes Program Recommendations  AACE/ADA: New Consensus Statement on Inpatient Glycemic Control (2015)  Target Ranges:  Prepandial:   less than 140 mg/dL      Peak postprandial:   less than 180 mg/dL (1-2 hours)      Critically ill patients:  140 - 180 mg/dL   Lab Results  Component Value Date   GLUCAP 332 (H) 12/08/2016   HGBA1C 10.3 (H) 11/09/2016    Review of Glycemic Control  Results for Tony Cunningham, RYANT (MRN [id number]) as of 12/08/2016 09:38  Ref. Range 12/07/2016 10:59 12/07/2016 11:52 12/07/2016 16:31 12/07/2016 20:58 12/08/2016 06:59  Glucose-Capillary Latest Ref Range: 65 - 99 mg/dL 801 (H) 655 (H) 83 76 374 (H)     Home DM Meds: Lantus 42 units QHS Novolog 5 units TID with meals Metformin 1000 mg BID  Current Insulin Orders: Lantus 55 units QHS Novolog Moderate Correction Scale/ SSI (0-15 units) TID AC + HS Novolog 10 units TID with meals Metformin 1000 mg BID  Agree with current orders for blood sugar management.  Susette Racer, RN, BA, MHA, CDE Diabetes Coordinator Inpatient Diabetes Program  [phone] (Team Pager) [phone] [facility] Office) 12/08/2016 9:37 AM

## 2016-12-08 NOTE — Progress Notes (Signed)
Patient ID: Tony Cunningham, male   DOB: September 01, 1975, 41 y.o.   MRN: [id number] Regressing: slight tremors noted, thoughts are beginning to be blocked, speech slightly slurred, rated depression as 5/10, appearance: disheveled, dry, scaly, flaky skin; denied any SI/SIB/AVH.

## 2016-12-09 ENCOUNTER — Telehealth: Payer: Self-pay | Admitting: *Deleted

## 2016-12-10 ENCOUNTER — Other Ambulatory Visit: Payer: Self-pay | Admitting: Psychiatry

## 2016-12-13 DIAGNOSIS — E119 Type 2 diabetes mellitus without complications: Secondary | ICD-10-CM

## 2016-12-13 DIAGNOSIS — M549 Dorsalgia, unspecified: Secondary | ICD-10-CM

## 2016-12-13 DIAGNOSIS — I10 Essential (primary) hypertension: Secondary | ICD-10-CM | POA: Diagnosis not present

## 2016-12-13 DIAGNOSIS — R112 Nausea with vomiting, unspecified: Secondary | ICD-10-CM | POA: Diagnosis not present

## 2016-12-13 DIAGNOSIS — F1110 Opioid abuse, uncomplicated: Secondary | ICD-10-CM | POA: Diagnosis not present

## 2016-12-13 DIAGNOSIS — E860 Dehydration: Secondary | ICD-10-CM | POA: Diagnosis not present

## 2016-12-13 DIAGNOSIS — F319 Bipolar disorder, unspecified: Secondary | ICD-10-CM | POA: Diagnosis not present

## 2016-12-13 DIAGNOSIS — E785 Hyperlipidemia, unspecified: Secondary | ICD-10-CM

## 2016-12-14 DIAGNOSIS — I10 Essential (primary) hypertension: Secondary | ICD-10-CM | POA: Diagnosis not present

## 2016-12-14 DIAGNOSIS — R112 Nausea with vomiting, unspecified: Secondary | ICD-10-CM

## 2016-12-14 DIAGNOSIS — E119 Type 2 diabetes mellitus without complications: Secondary | ICD-10-CM | POA: Diagnosis not present

## 2016-12-14 DIAGNOSIS — F319 Bipolar disorder, unspecified: Secondary | ICD-10-CM | POA: Diagnosis not present

## 2016-12-14 DIAGNOSIS — E860 Dehydration: Secondary | ICD-10-CM | POA: Diagnosis not present

## 2016-12-14 DIAGNOSIS — M549 Dorsalgia, unspecified: Secondary | ICD-10-CM | POA: Diagnosis not present

## 2016-12-14 DIAGNOSIS — E785 Hyperlipidemia, unspecified: Secondary | ICD-10-CM | POA: Diagnosis not present

## 2016-12-14 DIAGNOSIS — F1110 Opioid abuse, uncomplicated: Secondary | ICD-10-CM | POA: Diagnosis not present

## 2016-12-15 DIAGNOSIS — E860 Dehydration: Secondary | ICD-10-CM | POA: Diagnosis not present

## 2016-12-15 DIAGNOSIS — E785 Hyperlipidemia, unspecified: Secondary | ICD-10-CM | POA: Diagnosis not present

## 2016-12-15 DIAGNOSIS — M549 Dorsalgia, unspecified: Secondary | ICD-10-CM | POA: Diagnosis not present

## 2016-12-15 DIAGNOSIS — F319 Bipolar disorder, unspecified: Secondary | ICD-10-CM | POA: Diagnosis not present

## 2016-12-15 DIAGNOSIS — F1110 Opioid abuse, uncomplicated: Secondary | ICD-10-CM | POA: Diagnosis not present

## 2016-12-15 DIAGNOSIS — R112 Nausea with vomiting, unspecified: Secondary | ICD-10-CM | POA: Diagnosis not present

## 2016-12-15 DIAGNOSIS — E119 Type 2 diabetes mellitus without complications: Secondary | ICD-10-CM | POA: Diagnosis not present

## 2016-12-15 DIAGNOSIS — I10 Essential (primary) hypertension: Secondary | ICD-10-CM | POA: Diagnosis not present

## 2016-12-16 DIAGNOSIS — E119 Type 2 diabetes mellitus without complications: Secondary | ICD-10-CM | POA: Diagnosis not present

## 2016-12-16 DIAGNOSIS — E860 Dehydration: Secondary | ICD-10-CM | POA: Diagnosis not present

## 2016-12-16 DIAGNOSIS — I10 Essential (primary) hypertension: Secondary | ICD-10-CM | POA: Diagnosis not present

## 2016-12-16 DIAGNOSIS — R112 Nausea with vomiting, unspecified: Secondary | ICD-10-CM | POA: Diagnosis not present

## 2016-12-16 DIAGNOSIS — F319 Bipolar disorder, unspecified: Secondary | ICD-10-CM | POA: Diagnosis not present

## 2016-12-16 DIAGNOSIS — F410 Panic disorder [episodic paroxysmal anxiety] without agoraphobia: Secondary | ICD-10-CM

## 2016-12-17 DIAGNOSIS — R112 Nausea with vomiting, unspecified: Secondary | ICD-10-CM | POA: Diagnosis not present

## 2016-12-17 DIAGNOSIS — E119 Type 2 diabetes mellitus without complications: Secondary | ICD-10-CM | POA: Diagnosis not present

## 2016-12-17 DIAGNOSIS — F319 Bipolar disorder, unspecified: Secondary | ICD-10-CM | POA: Diagnosis not present

## 2016-12-17 DIAGNOSIS — I10 Essential (primary) hypertension: Secondary | ICD-10-CM | POA: Diagnosis not present

## 2016-12-17 DIAGNOSIS — F410 Panic disorder [episodic paroxysmal anxiety] without agoraphobia: Secondary | ICD-10-CM | POA: Diagnosis not present

## 2016-12-17 DIAGNOSIS — E860 Dehydration: Secondary | ICD-10-CM | POA: Diagnosis not present

## 2016-12-21 ENCOUNTER — Encounter (HOSPITAL_COMMUNITY): Payer: Self-pay | Admitting: Emergency Medicine

## 2016-12-21 ENCOUNTER — Emergency Department (HOSPITAL_COMMUNITY)
Admission: EM | Admit: 2016-12-21 | Discharge: 2016-12-22 | Disposition: A | Discharge status: Other Acute Inpatient Hospital | Payer: Medicare Other | Attending: Emergency Medicine | Admitting: Emergency Medicine

## 2016-12-21 DIAGNOSIS — E119 Type 2 diabetes mellitus without complications: Secondary | ICD-10-CM | POA: Diagnosis not present

## 2016-12-21 DIAGNOSIS — R45851 Suicidal ideations: Secondary | ICD-10-CM | POA: Insufficient documentation

## 2016-12-21 DIAGNOSIS — F17210 Nicotine dependence, cigarettes, uncomplicated: Secondary | ICD-10-CM | POA: Insufficient documentation

## 2016-12-21 DIAGNOSIS — Z79899 Other long term (current) drug therapy: Secondary | ICD-10-CM | POA: Insufficient documentation

## 2016-12-21 DIAGNOSIS — F419 Anxiety disorder, unspecified: Secondary | ICD-10-CM | POA: Diagnosis present

## 2016-12-21 DIAGNOSIS — Z794 Long term (current) use of insulin: Secondary | ICD-10-CM | POA: Insufficient documentation

## 2016-12-21 DIAGNOSIS — I10 Essential (primary) hypertension: Secondary | ICD-10-CM | POA: Diagnosis not present

## 2016-12-21 DIAGNOSIS — F251 Schizoaffective disorder, depressive type: Secondary | ICD-10-CM | POA: Diagnosis not present

## 2016-12-21 DIAGNOSIS — J45909 Unspecified asthma, uncomplicated: Secondary | ICD-10-CM | POA: Insufficient documentation

## 2016-12-21 LAB — COMPREHENSIVE METABOLIC PANEL
ALBUMIN: 3.3 g/dL — AB (ref 3.5–5.0)
ALT: 16 U/L — ABNORMAL LOW (ref 17–63)
ANION GAP: 8 (ref 5–15)
AST: 20 U/L (ref 15–41)
Alkaline Phosphatase: 55 U/L (ref 38–126)
BILIRUBIN TOTAL: 0.2 mg/dL — AB (ref 0.3–1.2)
BUN: <5 mg/dL — ABNORMAL LOW (ref 6–20)
CALCIUM: 8.5 mg/dL — AB (ref 8.9–10.3)
CO2: 32 mmol/L (ref 22–32)
Chloride: 102 mmol/L (ref 101–111)
Creatinine, Ser: 0.67 mg/dL (ref 0.61–1.24)
GFR calc non Af Amer: >60 mL/min (ref >60)
GLUCOSE: 240 mg/dL — AB (ref 65–99)
POTASSIUM: 3.9 mmol/L (ref 3.5–5.1)
SODIUM: 142 mmol/L (ref 135–145)
TOTAL PROTEIN: 5.8 g/dL — AB (ref 6.5–8.1)

## 2016-12-21 LAB — CBG MONITORING, ED
GLUCOSE-CAPILLARY: 308 mg/dL — AB (ref 65–99)
Glucose-Capillary: 299 mg/dL — ABNORMAL HIGH (ref 65–99)

## 2016-12-21 LAB — CBC
HEMATOCRIT: 34.9 % — AB (ref 39.0–52.0)
Hemoglobin: 12.3 g/dL — ABNORMAL LOW (ref 13.0–17.0)
MCH: 30.8 pg (ref 26.0–34.0)
MCHC: 35.2 g/dL (ref 30.0–36.0)
MCV: 87.3 fL (ref 78.0–100.0)
PLATELETS: 267 10*3/uL (ref 150–400)
RBC: 4 MIL/uL — ABNORMAL LOW (ref 4.22–5.81)
RDW: 13 % (ref 11.5–15.5)
WBC: 7.3 10*3/uL (ref 4.0–10.5)

## 2016-12-21 LAB — ACETAMINOPHEN LEVEL

## 2016-12-21 LAB — ETHANOL: Alcohol, Ethyl (B): <5 mg/dL (ref <5)

## 2016-12-21 LAB — SALICYLATE LEVEL

## 2016-12-21 MED ORDER — MONTELUKAST SODIUM 10 MG PO TABS
10.0000 mg | ORAL_TABLET | Freq: Every day | ORAL | Status: DC
  Administered 2016-12-21: 10 mg via ORAL
  Filled 2016-12-21 (×2): qty 1

## 2016-12-21 MED ORDER — ALUM & MAG HYDROXIDE-SIMETH 200-200-20 MG/5ML PO SUSP: 30.0000 mL | Freq: Four times a day (QID) | ORAL | Status: DC | PRN

## 2016-12-21 MED ORDER — TEMAZEPAM 15 MG PO CAPS
15.0000 mg | ORAL_CAPSULE | Freq: Every day | ORAL | Status: DC
  Administered 2016-12-21: 15 mg via ORAL
  Filled 2016-12-21: qty 1

## 2016-12-21 MED ORDER — ONDANSETRON HCL 4 MG PO TABS: 4.0000 mg | ORAL_TABLET | Freq: Three times a day (TID) | ORAL | Status: DC | PRN

## 2016-12-21 MED ORDER — INSULIN ASPART 100 UNIT/ML ~~LOC~~ SOLN
0.0000 [IU] | Freq: Every day | SUBCUTANEOUS | Status: DC
  Administered 2016-12-21: 4 [IU] via SUBCUTANEOUS
  Filled 2016-12-21: qty 1

## 2016-12-21 MED ORDER — INSULIN ASPART 100 UNIT/ML ~~LOC~~ SOLN
4.0000 [IU] | Freq: Three times a day (TID) | SUBCUTANEOUS | Status: DC
  Administered 2016-12-21 – 2016-12-22 (×3): 4 [IU] via SUBCUTANEOUS

## 2016-12-21 MED ORDER — CLOZAPINE 100 MG PO TABS
100.0000 mg | ORAL_TABLET | Freq: Every day | ORAL | Status: DC
  Administered 2016-12-21: 100 mg via ORAL
  Filled 2016-12-21 (×2): qty 1

## 2016-12-21 MED ORDER — ZOLPIDEM TARTRATE 5 MG PO TABS: 5.0000 mg | ORAL_TABLET | Freq: Every evening | ORAL | Status: DC | PRN

## 2016-12-21 MED ORDER — FLUVOXAMINE MALEATE 50 MG PO TABS
100.0000 mg | ORAL_TABLET | Freq: Every day | ORAL | Status: DC
  Administered 2016-12-21: 100 mg via ORAL
  Filled 2016-12-21 (×2): qty 2

## 2016-12-21 MED ORDER — IBUPROFEN 200 MG PO TABS: 600.0000 mg | ORAL_TABLET | Freq: Three times a day (TID) | ORAL | Status: DC | PRN

## 2016-12-21 MED ORDER — LISINOPRIL 20 MG PO TABS
20.0000 mg | ORAL_TABLET | Freq: Every day | ORAL | Status: DC
  Administered 2016-12-21 – 2016-12-22 (×2): 20 mg via ORAL
  Filled 2016-12-21 (×2): qty 1

## 2016-12-21 MED ORDER — PROPRANOLOL HCL 20 MG PO TABS
20.0000 mg | ORAL_TABLET | Freq: Two times a day (BID) | ORAL | Status: DC
  Administered 2016-12-22: 20 mg via ORAL
  Filled 2016-12-21 (×3): qty 1

## 2016-12-21 MED ORDER — NICOTINE 21 MG/24HR TD PT24
21.0000 mg | MEDICATED_PATCH | Freq: Every day | TRANSDERMAL | Status: DC
  Administered 2016-12-21: 21 mg via TRANSDERMAL
  Filled 2016-12-21: qty 1

## 2016-12-21 MED ORDER — SIMVASTATIN 40 MG PO TABS
40.0000 mg | ORAL_TABLET | Freq: Every day | ORAL | Status: DC
  Administered 2016-12-21 – 2016-12-22 (×2): 40 mg via ORAL
  Filled 2016-12-21 (×2): qty 1

## 2016-12-21 MED ORDER — INSULIN ASPART 100 UNIT/ML ~~LOC~~ SOLN
0.0000 [IU] | Freq: Three times a day (TID) | SUBCUTANEOUS | Status: DC
  Administered 2016-12-21 – 2016-12-22 (×2): 8 [IU] via SUBCUTANEOUS
  Administered 2016-12-22: 15 [IU] via SUBCUTANEOUS
  Filled 2016-12-21 (×3): qty 1

## 2016-12-21 NOTE — ED Notes (Signed)
Pt given cup of water and encouraged to drink more to increase blood pressure. Pt advised to rise slowly and call for help when needed.

## 2016-12-21 NOTE — ED Notes (Signed)
Pt sitting in dayroom watching TV on approach. Pt mood and affect appeared depressed and flat. Pt reported he was not sure how to take his medication for the reason for not taking his medications. Pt endorses SI but contracts for safety. Pt denies HI/AVH.
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
## 2016-12-21 NOTE — ED Notes (Signed)
Bed: [facility] Expected date:  Expected time:  Means of arrival:  Comments: 

## 2016-12-21 NOTE — ED Notes (Signed)
ED Provider at bedside. 

## 2016-12-21 NOTE — ED Notes (Signed)
Pt was tearful upon admission to [facility]. He admitted SI but denied HI and AVH. He said his suicide plan was "take meds." He contracted for safety with some prompting. His mood improved once his support worker visited. Pt was oriented to unit and its policies. He remains safe with rounding, camera monitoring, and 15-minute checks in place.

## 2016-12-21 NOTE — ED Notes (Signed)
Scheduled lisinopril at 1645 and simvastatin at 1800 not given yet because pharmacy hasn't signed off on meds. Per pharmacy, meds are awaiting reconciliation.

## 2016-12-21 NOTE — BH Assessment (Addendum)
Assessment Note  Tony Cunningham is an 41 y.o. male that presents this date with his peer support specialist [name] [phone]. Patient is observed to be tearful during the assessment and finds it difficult to answer this writer's questions. Patient appears to have problems processing the content of this writer's questions. Peer support provides collateral information stating patient has been residing ain a group home but recently relocated back to reside with his mother in [place], [place]. Patient has been receiving services from [facility] in [place] but has ran out of his medication refills that he acquired from his last inpatient hospitalization on 10/21/2016. Peer support reports that [facility] then refilled his medications but patient has been unable to obtain his refills. Peer support reports patient has been off his medications for 3 days or more. Patient denies S/I during assessment but per notes, patient attempted to cut his wrist earlier this week (Saturday) while at his mother's residence. Patient renders limited history and is very disorganized. Patient is oriented to place only stating he is "a hospital."  Per notes, patient has a history of Schizoaffective disorder with  multiple inpatient admissions and two suicide attempts (per chart). Pt provides minimal responses to clinician and seems to be responding to internal stimuli. Patient initially denied hallucinations and then reported AVH but cannot elaborate on content. Patient reports that he lives with his mother and at her house he has access to knives attempting to cut his wrist on Saturday with a knife but his mother stopped him. Patient has a extensive psychiatric history including depression, anxiety, previous suicidal attempts and recent admission for hallucinations and suicidal ideations. Patient is having active auditory hallucinations stating he sees "bad people." Case was staffed with Shaune Pollack DNP who recommended patient be re-evaluated in the a.m.    Diagnosis: Schizoaffective D/O (per notes)   Past Medical History:  Past Medical History:  Diagnosis Date  . Anxiety   . Asthma   . Diabetes mellitus   . High blood pressure   . Sinus complaint     History reviewed. No pertinent surgical history.  Family History: No family history on file.  Social History:  reports that he has been smoking Cigarettes.  He has been smoking about 0.50 packs per day. He has never used smokeless tobacco. He reports that he does not drink alcohol or use drugs.  Additional Social History:  Alcohol / Drug Use Pain Medications: Pt denies abuse. Prescriptions: Pt denies abuse. Over the Counter: Pt denies abuse. History of alcohol / drug use?: No history of alcohol / drug abuse Longest period of sobriety (when/how long): Reports of no past or current use of mind-altering substances  Negative Consequences of Use:  (Denies) Withdrawal Symptoms:  (Denies)  CIWA: CIWA-Ar BP: 128/78 Pulse Rate: 79 COWS:    Allergies: No Known Allergies  Home Medications:  (Not in a hospital admission)  OB/GYN Status:  No LMP for male patient.  General Assessment Data [facility] of Assessment: [facility] TTS Assessment: In system Is this a Tele or Face-to-Face Assessment?: Face-to-Face Is this an Initial Assessment or a Re-assessment for this encounter?: Initial Assessment Marital status: Single Maiden name: NA Is patient pregnant?: No Pregnancy Status: No Living Arrangements: Parent Can pt return to current living arrangement?: Yes Admission Status: Voluntary Is patient capable of signing voluntary admission?: Yes Referral Source: Self/Family/Friend Insurance type: Medicaid  Medical Screening Exam [name] Walk-in ONLY) Medical Exam completed: Yes  Crisis Care Plan Living Arrangements: Parent Legal Guardian:  (NA) Name of  Psychiatrist: [facility] Name of Therapist: None  Education Status Is patient currently in school?: No Current Grade:  (NA) Highest grade  of school patient has completed:  (8th) Name of school:  (NA) Contact person: NA  Risk to self with the past 6 months Suicidal Ideation: Yes-Currently Present Has patient been a risk to self within the past 6 months prior to admission? : Yes Suicidal Intent: Yes-Currently Present (Earlier this week per notes) Has patient had any suicidal intent within the past 6 months prior to admission? : Yes Is patient at risk for suicide?: Yes Suicidal Plan?: Yes-Currently Present (Per notes cut wrist) Has patient had any suicidal plan within the past 6 months prior to admission? : Yes Specify Current Suicidal Plan: Cut wrist Access to Means: Yes Specify Access to Suicidal Means: Pt has knives at mother's home What has been your use of drugs/alcohol within the last 12 months?: Denies Previous Attempts/Gestures: Yes How many times?: 2 Other Self Harm Risks: NA Triggers for Past Attempts: Other (Comment) (Family stressors) Intentional Self Injurious Behavior: None Family Suicide History: No Recent stressful life event(s): Other (Comment) (Off medications) Persecutory voices/beliefs?: No Depression:  (Pt unable to identify symptoms) Substance abuse history and/or treatment for substance abuse?: No Suicide prevention information given to non-admitted patients: Not applicable  Risk to Others within the past 6 months Homicidal Ideation: No Does patient have any lifetime risk of violence toward others beyond the six months prior to admission? : No Thoughts of Harm to Others: No Current Homicidal Intent: No Current Homicidal Plan: No Access to Homicidal Means: No Identified Victim: n History of harm to others?: No Assessment of Violence: None Noted Violent Behavior Description: n Does patient have access to weapons?: No Criminal Charges Pending?: No Does patient have a court date: No Is patient on probation?: No  Psychosis Hallucinations: Auditory, Visual Delusions: None noted  Mental  Status Report Appearance/Hygiene: In scrubs Eye Contact: Fair Motor Activity: Freedom of movement Speech: Slow, Slurred Level of Consciousness: Restless Mood: Anxious Affect: Sad Anxiety Level: Minimal Thought Processes: Thought Blocking Judgement: Unimpaired Orientation: Place Obsessive Compulsive Thoughts/Behaviors: None  Cognitive Functioning Concentration: Decreased Memory: Recent Intact IQ:  (UTA) Insight: Poor Impulse Control: Poor Appetite: Fair Weight Loss: 0 Weight Gain: 0 Sleep: Decreased Total Hours of Sleep: 6 Vegetative Symptoms: None  ADLScreening [facility] Assessment Services) Patient's cognitive ability adequate to safely complete daily activities?: Yes Patient able to express need for assistance with ADLs?: Yes Independently performs ADLs?: Yes (appropriate for developmental age)  Prior Inpatient Therapy Prior Inpatient Therapy: Yes Prior Therapy Dates: 2018 Prior Therapy Facilty/Provider(s): [facility] Reason for Treatment: MH issues  Prior Outpatient Therapy Prior Outpatient Therapy: Yes Prior Therapy Dates: 2018 Prior Therapy Facilty/Provider(s): [facility] Reason for Treatment: MH issues Does patient have an ACCT team?: No Does patient have Intensive In-House Services?  : No Does patient have [facility] services? : No Does patient have P4CC services?: No  ADL Screening (condition at time of admission) Patient's cognitive ability adequate to safely complete daily activities?: Yes Is the patient deaf or have difficulty hearing?: No Does the patient have difficulty seeing, even when wearing glasses/contacts?: No Does the patient have difficulty concentrating, remembering, or making decisions?: Yes Patient able to express need for assistance with ADLs?: Yes Does the patient have difficulty dressing or bathing?: No Independently performs ADLs?: Yes (appropriate for developmental age) Does the patient have difficulty walking or climbing stairs?: No Weakness of  Legs: None Weakness of Arms/Hands: None  Home Assistive Devices/Equipment  Home Assistive Devices/Equipment: None  Therapy Consults (therapy consults require a physician order) PT Evaluation Needed: No OT Evalulation Needed: No SLP Evaluation Needed: No Abuse/Neglect Assessment (Assessment to be complete while patient is alone) Physical Abuse: Denies Verbal Abuse: Denies Sexual Abuse: Denies Exploitation of patient/patient's resources: Denies Self-Neglect: Denies Values / Beliefs Cultural Requests During Hospitalization: None Spiritual Requests During Hospitalization: None Consults Spiritual Care Consult Needed: No Social Work Consult Needed: No Merchant navy officer (For Healthcare) Does Patient Have a Medical Advance Directive?: No Would patient like information on creating a medical advance directive?: No - Patient declined    Additional Information 1:1 In Past 12 Months?: No CIRT Risk: No Elopement Risk: No Does patient have medical clearance?: Yes     Disposition: Case was staffed with Shaune Pollack DNP who recommended patient be re-evaluated in the a.m.      On Site Evaluation by:   Reviewed with Physician:    Alfredia Ferguson 12/21/2016 6:22 PM

## 2016-12-21 NOTE — BH Assessment (Signed)
[facility] Assessment Progress Note   Case was staffed with Lord DNP who recommended patient be re-evaluated in the a.m.    

## 2016-12-21 NOTE — ED Provider Notes (Signed)
[facility]-EMERGENCY DEPT Provider Note   CSN: [id number] Arrival date & time: 12/21/16  1557     History   Chief Complaint Chief Complaint  Patient presents with  . Suicidal  . Depression  . Anxiety    HPI Tony Cunningham is a 41 y.o. male.  HPI   41 year old male with history of schizophrenia, polysubstance abuse, recurrent suicidal ideation presenting to the ER with suicidal ideation.  Pt admits to having severe depression that paralyzed him.  He have been without his psychiatric medication x 2 days because he didn't have access to it.  He felt increase suicidal ideation with plan to overdose of medications.  Report having access to a knife and tried to harm himself recently.  Denies homicidal ideation, auditory or visual hallucination.  Admits to sleeping and eating less.  Denies self medicating with illicit drugs or alcohol. He is here per request of [facility] psychiatric facility for medical clearance.    Past Medical History:  Diagnosis Date  . Anxiety   . Asthma   . Diabetes mellitus   . High blood pressure   . Sinus complaint     Patient Active Problem List   Diagnosis Date Noted  . Schizoaffective disorder, depressive type (HCC) 09/23/2016  . Polysubstance abuse 07/17/2016  . Tardive dyskinesia 07/16/2016  . Tobacco use disorder 07/07/2016  . Dyslipidemia 07/07/2016  . Asthma 07/07/2016  . HTN (hypertension) 07/06/2016  . Diabetes (HCC) 12/25/2010    History reviewed. No pertinent surgical history.     Home Medications    Prior to Admission medications   Medication Sig Start Date End Date Taking? Authorizing Provider  cloZAPine (CLOZARIL) 100 MG tablet Take 1 tablet (100 mg total) by mouth at bedtime. 12/08/16   [name], [name], MD  fluvoxaMINE (LUVOX) 100 MG tablet Take 1 tablet (100 mg total) by mouth at bedtime. 12/08/16   [name], [name], MD  insulin aspart (NOVOLOG) 100 UNIT/ML injection Inject 10 Units into the skin 3 (three) times daily with meals. 12/08/16    [name], [name], MD  insulin glargine (LANTUS) 100 UNIT/ML injection Inject 0.55 mLs (55 Units total) into the skin at bedtime. 12/08/16   [name], [name], MD  lisinopril (PRINIVIL,ZESTRIL) 20 MG tablet Take 1 tablet (20 mg total) by mouth daily. 12/09/16   [name], [name], MD  meloxicam (MOBIC) 7.5 MG tablet Take 1 tablet (7.5 mg total) by mouth 2 (two) times daily after a meal. 12/08/16   [name], [name], MD  metFORMIN (GLUCOPHAGE) 1000 MG tablet Take 1 tablet (1,000 mg total) by mouth 2 (two) times daily with a meal. 12/08/16   [name], [name], MD  montelukast (SINGULAIR) 10 MG tablet Take 1 tablet (10 mg total) by mouth at bedtime. 12/08/16   [name], [name], MD  nicotine (NICODERM CQ - DOSED IN MG/24 HR) 7 mg/24hr patch Place 1 patch (7 mg total) onto the skin daily. 12/09/16   [name], [name], MD  pantoprazole (PROTONIX) 40 MG tablet Take 1 tablet (40 mg total) by mouth daily. 12/09/16   [name], [name], MD  polyethylene glycol (MIRALAX / GLYCOLAX) packet Take 17 g by mouth daily. 12/09/16   [name], [name], MD  propranolol (INDERAL) 20 MG tablet Take 1 tablet (20 mg total) by mouth 2 (two) times daily. 12/08/16   [name], [name], MD  simvastatin (ZOCOR) 40 MG tablet Take 1 tablet (40 mg total) by mouth daily at 6 PM. 12/08/16   [name], [name],  MD  temazepam (RESTORIL) 15 MG capsule Take 1 capsule (15 mg total) by mouth at bedtime. 12/08/16   [name], [name], MD    Family History No family history on file.  Social History Social History  Substance Use Topics  . Smoking status: Current Every Day Smoker    Packs/day: 0.50    Types: Cigarettes  . Smokeless tobacco: Never Used  . Alcohol use No     Allergies   Patient has no known allergies.   Review of Systems Review of Systems  All other systems reviewed and are negative.    Physical Exam Updated Vital Signs BP 128/78 (BP Location: Left Arm)   Pulse 79   Temp 99.4 F (37.4 C) (Oral)   Resp 19   Ht 6' (1.829 m)   Wt 81.2 kg  (179 lb)   SpO2 100%   BMI 24.28 kg/m   Physical Exam  Constitutional: He appears well-developed and well-nourished. No distress.  HENT:  Head: Atraumatic.  Eyes: Conjunctivae are normal.  Neck: Neck supple.  Cardiovascular: Normal rate and regular rhythm.   Pulmonary/Chest: Effort normal and breath sounds normal.  Abdominal: Soft. There is no tenderness.  Neurological: He is alert. GCS eye subscore is 4. GCS verbal subscore is 5. GCS motor subscore is 6.  Skin: No rash noted.  Psychiatric: His speech is normal. He is withdrawn. Thought content is not paranoid. He exhibits a depressed mood. He expresses suicidal ideation. He expresses no homicidal ideation. He expresses suicidal plans. He expresses no homicidal plans.  Nursing note and vitals reviewed.    ED Treatments / Results  Labs (all labs ordered are listed, but only abnormal results are displayed) Labs Reviewed  COMPREHENSIVE METABOLIC PANEL - Abnormal; Notable for the following:       Result Value   Glucose, Bld 240 (*)    BUN <5 (*)    Calcium 8.5 (*)    Total Protein 5.8 (*)    Albumin 3.3 (*)    ALT 16 (*)    Total Bilirubin 0.2 (*)    All other components within normal limits  ACETAMINOPHEN LEVEL - Abnormal; Notable for the following:    Acetaminophen (Tylenol), Serum <10 (*)    All other components within normal limits  CBC - Abnormal; Notable for the following:    RBC 4.00 (*)    Hemoglobin 12.3 (*)    HCT 34.9 (*)    All other components within normal limits  CBG MONITORING, ED - Abnormal; Notable for the following:    Glucose-Capillary 299 (*)    All other components within normal limits  ETHANOL  SALICYLATE LEVEL  RAPID URINE DRUG SCREEN, HOSP PERFORMED    EKG  EKG Interpretation None       Radiology No results found.  Procedures Procedures (including critical care time)  Medications Ordered in ED Medications  ibuprofen (ADVIL,MOTRIN) tablet 600 mg (not administered)  zolpidem  (AMBIEN) tablet 5 mg (not administered)  ondansetron (ZOFRAN) tablet 4 mg (not administered)  alum & mag hydroxide-simeth (MAALOX/MYLANTA) 200-200-20 MG/5ML suspension 30 mL (not administered)  nicotine (NICODERM CQ - dosed in mg/24 hours) patch 21 mg (21 mg Transdermal Patch Applied 12/21/16 1756)  cloZAPine (CLOZARIL) tablet 100 mg (not administered)  fluvoxaMINE (LUVOX) tablet 100 mg (not administered)  lisinopril (PRINIVIL,ZESTRIL) tablet 20 mg (not administered)  montelukast (SINGULAIR) tablet 10 mg (not administered)  propranolol (INDERAL) tablet 20 mg (not administered)  simvastatin (ZOCOR) tablet 40 mg (not administered)  temazepam (  RESTORIL) capsule 15 mg (not administered)  insulin aspart (novoLOG) injection 0-15 Units (8 Units Subcutaneous Given 12/21/16 1750)  insulin aspart (novoLOG) injection 0-5 Units (not administered)  insulin aspart (novoLOG) injection 4 Units (4 Units Subcutaneous Given 12/21/16 1751)     Initial Impression / Assessment and Plan / ED Course  I have reviewed the triage vital signs and the nursing notes.  Pertinent labs & imaging results that were available during my care of the patient were reviewed by me and considered in my medical decision making (see chart for details).     BP 128/78 (BP Location: Left Arm)   Pulse 79   Temp 99.4 F (37.4 C) (Oral)   Resp 19   Ht 6' (1.829 m)   Wt 81.2 kg (179 lb)   SpO2 100%   BMI 24.28 kg/m    Final Clinical Impressions(s) / ED Diagnoses   Final diagnoses:  Suicidal ideations    New Prescriptions New Prescriptions   No medications on file   4:30 PM Pt with hx of depression, currently having suicidal ideation with plan to OD.  Sts he is without his psychiatric medicine due to not having access to it.  He did not elaborate more than that.  Will perform medical clearance and will consult TTS for further psychiatric assessment once pt is medically cleared.    Hx of diabetes, will place pt on insulin  sliding scale.   6:57 PM Pt is hyperglycemic, but will be manage via insulin sliding scale. Otherwise pt is medically cleared for further psychiatric management.     Fayrene Helper, PA-C 12/21/16 Brock Ra, MD 12/21/16 [phone]

## 2016-12-21 NOTE — ED Notes (Signed)
Bed: WA31 Expected date:  Expected time:  Means of arrival:  Comments: 

## 2016-12-22 ENCOUNTER — Encounter (HOSPITAL_COMMUNITY): Payer: Self-pay

## 2016-12-22 ENCOUNTER — Inpatient Hospital Stay (HOSPITAL_COMMUNITY)
Admission: AD | Admit: 2016-12-22 | Discharge: 2017-01-15 | DRG: 885 | Disposition: A | Discharge status: Home / Self Care | Payer: Medicare Other | Source: Intra-hospital | Attending: Psychiatry | Admitting: Psychiatry

## 2016-12-22 DIAGNOSIS — G2111 Neuroleptic induced parkinsonism: Secondary | ICD-10-CM | POA: Diagnosis not present

## 2016-12-22 DIAGNOSIS — Z791 Long term (current) use of non-steroidal anti-inflammatories (NSAID): Secondary | ICD-10-CM | POA: Diagnosis not present

## 2016-12-22 DIAGNOSIS — R45851 Suicidal ideations: Secondary | ICD-10-CM | POA: Diagnosis present

## 2016-12-22 DIAGNOSIS — F251 Schizoaffective disorder, depressive type: Secondary | ICD-10-CM | POA: Diagnosis present

## 2016-12-22 DIAGNOSIS — E7800 Pure hypercholesterolemia, unspecified: Secondary | ICD-10-CM | POA: Diagnosis present

## 2016-12-22 DIAGNOSIS — F39 Unspecified mood [affective] disorder: Secondary | ICD-10-CM | POA: Diagnosis not present

## 2016-12-22 DIAGNOSIS — F419 Anxiety disorder, unspecified: Secondary | ICD-10-CM | POA: Diagnosis present

## 2016-12-22 DIAGNOSIS — M542 Cervicalgia: Secondary | ICD-10-CM | POA: Diagnosis not present

## 2016-12-22 DIAGNOSIS — F29 Unspecified psychosis not due to a substance or known physiological condition: Secondary | ICD-10-CM | POA: Diagnosis not present

## 2016-12-22 DIAGNOSIS — E119 Type 2 diabetes mellitus without complications: Secondary | ICD-10-CM | POA: Diagnosis present

## 2016-12-22 DIAGNOSIS — T43595A Adverse effect of other antipsychotics and neuroleptics, initial encounter: Secondary | ICD-10-CM | POA: Diagnosis not present

## 2016-12-22 DIAGNOSIS — Z794 Long term (current) use of insulin: Secondary | ICD-10-CM | POA: Diagnosis not present

## 2016-12-22 DIAGNOSIS — F17210 Nicotine dependence, cigarettes, uncomplicated: Secondary | ICD-10-CM | POA: Diagnosis not present

## 2016-12-22 DIAGNOSIS — J45909 Unspecified asthma, uncomplicated: Secondary | ICD-10-CM | POA: Diagnosis present

## 2016-12-22 DIAGNOSIS — G4700 Insomnia, unspecified: Secondary | ICD-10-CM | POA: Diagnosis present

## 2016-12-22 DIAGNOSIS — E785 Hyperlipidemia, unspecified: Secondary | ICD-10-CM | POA: Diagnosis present

## 2016-12-22 DIAGNOSIS — I10 Essential (primary) hypertension: Secondary | ICD-10-CM | POA: Diagnosis present

## 2016-12-22 DIAGNOSIS — F250 Schizoaffective disorder, bipolar type: Secondary | ICD-10-CM | POA: Diagnosis not present

## 2016-12-22 DIAGNOSIS — R443 Hallucinations, unspecified: Secondary | ICD-10-CM | POA: Diagnosis not present

## 2016-12-22 DIAGNOSIS — Z915 Personal history of self-harm: Secondary | ICD-10-CM

## 2016-12-22 DIAGNOSIS — F17203 Nicotine dependence unspecified, with withdrawal: Secondary | ICD-10-CM | POA: Diagnosis not present

## 2016-12-22 LAB — DIFFERENTIAL
BASOS PCT: 0 %
Basophils Absolute: 0 10*3/uL (ref 0.0–0.1)
EOS PCT: 3 %
Eosinophils Absolute: 0.2 10*3/uL (ref 0.0–0.7)
Lymphocytes Relative: 33 %
Lymphs Abs: 2.5 10*3/uL (ref 0.7–4.0)
MONO ABS: 0.5 10*3/uL (ref 0.1–1.0)
Monocytes Relative: 7 %
NEUTROS PCT: 57 %
Neutro Abs: 4.2 10*3/uL (ref 1.7–7.7)

## 2016-12-22 LAB — CBG MONITORING, ED
GLUCOSE-CAPILLARY: 273 mg/dL — AB (ref 65–99)
GLUCOSE-CAPILLARY: 239 mg/dL — AB (ref 65–99)
GLUCOSE-CAPILLARY: 384 mg/dL — AB (ref 65–99)

## 2016-12-22 LAB — GLUCOSE, CAPILLARY: GLUCOSE-CAPILLARY: 361 mg/dL — AB (ref 65–99)

## 2016-12-22 MED ORDER — INSULIN ASPART 100 UNIT/ML ~~LOC~~ SOLN
0.0000 [IU] | Freq: Every day | SUBCUTANEOUS | Status: DC
  Administered 2016-12-22: 5 [IU] via SUBCUTANEOUS
  Administered 2016-12-23: 4 [IU] via SUBCUTANEOUS
  Administered 2016-12-24: 5 [IU] via SUBCUTANEOUS
  Administered 2016-12-25: 3 [IU] via SUBCUTANEOUS
  Administered 2016-12-26 – 2016-12-27 (×2): 4 [IU] via SUBCUTANEOUS
  Administered 2016-12-28: 2 [IU] via SUBCUTANEOUS
  Administered 2016-12-29: 4 [IU] via SUBCUTANEOUS
  Administered 2016-12-30: 2 [IU] via SUBCUTANEOUS
  Administered 2016-12-31: 3 [IU] via SUBCUTANEOUS
  Administered 2017-01-02: 2 [IU] via SUBCUTANEOUS
  Administered 2017-01-03: 5 [IU] via SUBCUTANEOUS
  Administered 2017-01-04 – 2017-01-05 (×2): 2 [IU] via SUBCUTANEOUS
  Administered 2017-01-06: 4 [IU] via SUBCUTANEOUS
  Administered 2017-01-10 – 2017-01-13 (×2): 3 [IU] via SUBCUTANEOUS
  Administered 2017-01-14: 2 [IU] via SUBCUTANEOUS

## 2016-12-22 MED ORDER — FLUVOXAMINE MALEATE 50 MG PO TABS
100.0000 mg | ORAL_TABLET | Freq: Every day | ORAL | Status: DC
  Administered 2016-12-22 – 2016-12-29 (×8): 100 mg via ORAL
  Filled 2016-12-22 (×5): qty 2
  Filled 2016-12-22: qty 1
  Filled 2016-12-22 (×5): qty 2

## 2016-12-22 MED ORDER — METFORMIN HCL 500 MG PO TABS
1000.0000 mg | ORAL_TABLET | Freq: Two times a day (BID) | ORAL | Status: DC
  Administered 2016-12-23 – 2017-01-15 (×45): 1000 mg via ORAL
  Filled 2016-12-22 (×50): qty 2

## 2016-12-22 MED ORDER — MONTELUKAST SODIUM 10 MG PO TABS
10.0000 mg | ORAL_TABLET | Freq: Every day | ORAL | Status: DC
  Administered 2016-12-22 – 2017-01-14 (×24): 10 mg via ORAL
  Filled 2016-12-22 (×27): qty 1

## 2016-12-22 MED ORDER — METFORMIN HCL 500 MG PO TABS
1000.0000 mg | ORAL_TABLET | Freq: Two times a day (BID) | ORAL | Status: DC
  Administered 2016-12-22 (×2): 1000 mg via ORAL
  Filled 2016-12-22: qty 2

## 2016-12-22 MED ORDER — SIMVASTATIN 40 MG PO TABS
40.0000 mg | ORAL_TABLET | Freq: Every day | ORAL | Status: DC
  Filled 2016-12-22 (×2): qty 1

## 2016-12-22 MED ORDER — INSULIN ASPART 100 UNIT/ML ~~LOC~~ SOLN
4.0000 [IU] | Freq: Three times a day (TID) | SUBCUTANEOUS | Status: DC
  Administered 2016-12-23 – 2016-12-24 (×4): 4 [IU] via SUBCUTANEOUS

## 2016-12-22 MED ORDER — PANTOPRAZOLE SODIUM 40 MG PO TBEC
40.0000 mg | DELAYED_RELEASE_TABLET | Freq: Every day | ORAL | Status: DC
  Administered 2016-12-23 – 2017-01-15 (×23): 40 mg via ORAL
  Filled 2016-12-22 (×27): qty 1

## 2016-12-22 MED ORDER — INSULIN ASPART 100 UNIT/ML ~~LOC~~ SOLN
0.0000 [IU] | Freq: Three times a day (TID) | SUBCUTANEOUS | Status: DC
  Administered 2016-12-23: 4 [IU] via SUBCUTANEOUS
  Administered 2016-12-23: 5 [IU] via SUBCUTANEOUS
  Administered 2016-12-23: 15 [IU] via SUBCUTANEOUS
  Administered 2016-12-24: 11 [IU] via SUBCUTANEOUS
  Administered 2016-12-25: 3 [IU] via SUBCUTANEOUS
  Administered 2016-12-25 – 2016-12-26 (×2): 5 [IU] via SUBCUTANEOUS
  Administered 2016-12-26: 3 [IU] via SUBCUTANEOUS
  Administered 2016-12-26: 5 [IU] via SUBCUTANEOUS
  Administered 2016-12-27: 11 [IU] via SUBCUTANEOUS
  Administered 2016-12-28 (×2): 3 [IU] via SUBCUTANEOUS
  Administered 2016-12-28: 8 [IU] via SUBCUTANEOUS
  Administered 2016-12-29: 2 [IU] via SUBCUTANEOUS
  Administered 2016-12-29: 5 [IU] via SUBCUTANEOUS
  Administered 2016-12-30: 2 [IU] via SUBCUTANEOUS
  Administered 2016-12-30: 11 [IU] via SUBCUTANEOUS
  Administered 2016-12-30: 13:00:00 5 [IU] via SUBCUTANEOUS
  Administered 2016-12-31: 3 [IU] via SUBCUTANEOUS
  Administered 2016-12-31: 8 [IU] via SUBCUTANEOUS
  Administered 2016-12-31: 2 [IU] via SUBCUTANEOUS
  Administered 2017-01-01: 11 [IU] via SUBCUTANEOUS
  Administered 2017-01-01: 15 [IU] via SUBCUTANEOUS
  Administered 2017-01-02: 2 [IU] via SUBCUTANEOUS
  Administered 2017-01-02: 11 [IU] via SUBCUTANEOUS
  Administered 2017-01-03 – 2017-01-04 (×4): 3 [IU] via SUBCUTANEOUS
  Administered 2017-01-05: 8 [IU] via SUBCUTANEOUS
  Administered 2017-01-05: 5 [IU] via SUBCUTANEOUS
  Administered 2017-01-06 (×2): 2 [IU] via SUBCUTANEOUS
  Administered 2017-01-06: 11 [IU] via SUBCUTANEOUS
  Administered 2017-01-07: 8 [IU] via SUBCUTANEOUS
  Administered 2017-01-07: 0 [IU] via SUBCUTANEOUS
  Administered 2017-01-08 (×2): 5 [IU] via SUBCUTANEOUS
  Administered 2017-01-09 (×2): 8 [IU] via SUBCUTANEOUS
  Administered 2017-01-10: 3 [IU] via SUBCUTANEOUS
  Administered 2017-01-10: 15 [IU] via SUBCUTANEOUS
  Administered 2017-01-11: 5 [IU] via SUBCUTANEOUS
  Administered 2017-01-11 – 2017-01-12 (×3): 3 [IU] via SUBCUTANEOUS
  Administered 2017-01-12: 5 [IU] via SUBCUTANEOUS
  Administered 2017-01-13: 3 [IU] via SUBCUTANEOUS
  Administered 2017-01-13: 15 [IU] via SUBCUTANEOUS
  Administered 2017-01-13: 2 [IU] via SUBCUTANEOUS
  Administered 2017-01-14: 3 [IU] via SUBCUTANEOUS
  Administered 2017-01-14: 8 [IU] via SUBCUTANEOUS
  Administered 2017-01-14: 5 [IU] via SUBCUTANEOUS

## 2016-12-22 MED ORDER — ACETAMINOPHEN 325 MG PO TABS
650.0000 mg | ORAL_TABLET | Freq: Four times a day (QID) | ORAL | Status: DC | PRN
  Administered 2016-12-30 – 2017-01-06 (×3): 650 mg via ORAL
  Filled 2016-12-22 (×3): qty 2

## 2016-12-22 MED ORDER — NICOTINE 21 MG/24HR TD PT24: 21.0000 mg | MEDICATED_PATCH | Freq: Every day | TRANSDERMAL | Status: DC

## 2016-12-22 MED ORDER — TRAZODONE HCL 50 MG PO TABS
50.0000 mg | ORAL_TABLET | Freq: Every evening | ORAL | Status: DC | PRN
  Filled 2016-12-22 (×5): qty 1

## 2016-12-22 MED ORDER — IBUPROFEN 600 MG PO TABS
600.0000 mg | ORAL_TABLET | Freq: Three times a day (TID) | ORAL | Status: DC | PRN
  Administered 2016-12-22 – 2016-12-28 (×3): 600 mg via ORAL
  Filled 2016-12-22 (×3): qty 1

## 2016-12-22 MED ORDER — MAGNESIUM HYDROXIDE 400 MG/5ML PO SUSP: 30.0000 mL | Freq: Every day | ORAL | Status: DC | PRN

## 2016-12-22 MED ORDER — NICOTINE POLACRILEX 2 MG MT GUM: 2.0000 mg | CHEWING_GUM | OROMUCOSAL | Status: DC | PRN

## 2016-12-22 MED ORDER — PANTOPRAZOLE SODIUM 40 MG PO TBEC
40.0000 mg | DELAYED_RELEASE_TABLET | Freq: Every day | ORAL | Status: DC
  Administered 2016-12-22: 40 mg via ORAL
  Filled 2016-12-22: qty 1

## 2016-12-22 MED ORDER — LISINOPRIL 20 MG PO TABS
20.0000 mg | ORAL_TABLET | Freq: Every day | ORAL | Status: DC
  Administered 2016-12-23 – 2017-01-15 (×23): 20 mg via ORAL
  Filled 2016-12-22 (×27): qty 1

## 2016-12-22 MED ORDER — PROPRANOLOL HCL 20 MG PO TABS
20.0000 mg | ORAL_TABLET | Freq: Two times a day (BID) | ORAL | Status: DC
  Administered 2016-12-22 – 2017-01-15 (×46): 20 mg via ORAL
  Filled 2016-12-22 (×22): qty 1
  Filled 2016-12-22: qty 2
  Filled 2016-12-22 (×28): qty 1

## 2016-12-22 MED ORDER — ONDANSETRON HCL 4 MG PO TABS: 4.0000 mg | ORAL_TABLET | Freq: Three times a day (TID) | ORAL | Status: DC | PRN

## 2016-12-22 MED ORDER — CLOZAPINE 100 MG PO TABS
100.0000 mg | ORAL_TABLET | Freq: Every day | ORAL | Status: DC
  Administered 2016-12-22 – 2016-12-24 (×3): 100 mg via ORAL
  Filled 2016-12-22 (×5): qty 1

## 2016-12-22 MED ORDER — POLYETHYLENE GLYCOL 3350 17 G PO PACK
17.0000 g | PACK | Freq: Every day | ORAL | Status: DC
  Administered 2016-12-23 – 2017-01-06 (×4): 17 g via ORAL
  Filled 2016-12-22 (×25): qty 1

## 2016-12-22 MED ORDER — POLYETHYLENE GLYCOL 3350 17 G PO PACK
17.0000 g | PACK | Freq: Every day | ORAL | Status: DC
  Filled 2016-12-22: qty 1

## 2016-12-22 MED ORDER — ALUM & MAG HYDROXIDE-SIMETH 200-200-20 MG/5ML PO SUSP
30.0000 mL | Freq: Four times a day (QID) | ORAL | Status: DC | PRN
  Administered 2017-01-04 – 2017-01-08 (×2): 30 mL via ORAL
  Filled 2016-12-22 (×2): qty 30

## 2016-12-22 NOTE — Plan of Care (Signed)
Problem: Safety: Goal: Periods of time without injury will increase Outcome: Progressing Pt. denies SI/HI/AVH at this time, remains a low fall risk, Q 15 checks in effect.

## 2016-12-22 NOTE — Progress Notes (Signed)
12/22/16 1341:  Recreation therapy intern introduced self and LRT to pt and offered activities.  Pt declined.  [name], LRT/CTRS

## 2016-12-22 NOTE — Consult Note (Signed)
[facility] Consult   Reason for Consult:  Depression, suicidal Referring Physician:  EDP Patient Identification: Ibrahima Holberg MRN:  [id number] Principal Diagnosis: Schizoaffective disorder, depressive type [facility]) Diagnosis:   Patient Active Problem List   Diagnosis Date Noted  . Schizoaffective disorder, depressive type ([place]) [F25.1] 09/23/2016    Priority: High  . Polysubstance abuse [F19.10] 07/17/2016  . Tardive dyskinesia [G24.01] 07/16/2016  . Tobacco use disorder [F17.200] 07/07/2016  . Dyslipidemia [E78.5] 07/07/2016  . Asthma [J45.909] 07/07/2016  . HTN (hypertension) [I10] 07/06/2016  . Diabetes ([place]) [E11.9] 12/25/2010    Total Time spent with patient: 45 minutes  Subjective:   Desman Polak is a 41 y.o. male patient admitted with suicidal thought  HPI:  Patient with history of Schizoaffective disorder-depressed type who presents to [facility] due to worsening depression and suicidal thoughts with plan to overdose on medications. Patient reports low energy level, lack of motivation, hopelessness, poor appetite and difficulty sleeping. Patient also reports that his symptoms got worse because he ran out of his medications 3 days ago.  Past Psychiatric History: as needed  Risk to Self: Suicidal Ideation: Yes-Currently Present Suicidal Intent: Yes-Currently Present (Earlier this week per notes) Is patient at risk for suicide?: Yes Suicidal Plan?: Yes-Currently Present (Per notes cut wrist) Specify Current Suicidal Plan: Cut wrist Access to Means: Yes Specify Access to Suicidal Means: Pt has knives at mother's home What has been your use of drugs/alcohol within the last 12 months?: Denies How many times?: 2 Other Self Harm Risks: NA Triggers for Past Attempts: Other (Comment) (Family stressors) Intentional Self Injurious Behavior: None Risk to Others: Homicidal Ideation: No Thoughts of Harm to Others: No Current Homicidal Intent: No Current Homicidal Plan:  No Access to Homicidal Means: No Identified Victim: n History of harm to others?: No Assessment of Violence: None Noted Violent Behavior Description: n Does patient have access to weapons?: No Criminal Charges Pending?: No Does patient have a court date: No Prior Inpatient Therapy: Prior Inpatient Therapy: Yes Prior Therapy Dates: 2018 Prior Therapy Facilty/Provider(s): [facility] Reason for Treatment: MH issues Prior Outpatient Therapy: Prior Outpatient Therapy: Yes Prior Therapy Dates: 2018 Prior Therapy Facilty/Provider(s): [facility] Reason for Treatment: MH issues Does patient have an ACCT team?: No Does patient have Intensive In-House Services?  : No Does patient have [facility] services? : No Does patient have [facility] services?: No  Past Medical History:  Past Medical History:  Diagnosis Date  . Anxiety   . Asthma   . Diabetes mellitus   . High blood pressure   . Sinus complaint    History reviewed. No pertinent surgical history. Family History: No family history on file. Family Psychiatric  History:  Social History:  History  Alcohol Use No     History  Drug Use No    Social History   Social History  . Marital status: Single    Spouse name: [name]  . Number of children: [name]  . Years of education: [name]   Social History Main Topics  . Smoking status: Current Every Day Smoker    Packs/day: 0.50    Types: Cigarettes  . Smokeless tobacco: Never Used  . Alcohol use No  . Drug use: No  . Sexual activity: Not Currently   Other Topics Concern  . None   Social History Narrative  . None   Additional Social History:    Allergies:  No Known Allergies  Labs:  Results for orders placed or performed during the hospital encounter of  12/21/16 (from the past 48 hour(s))  Comprehensive metabolic panel     Status: Abnormal   Collection Time: 12/21/16  4:28 PM  Result Value Ref Range   Sodium 142 135 - 145 mmol/L   Potassium 3.9 3.5 - 5.1 mmol/L   Chloride 102 101 - 111  mmol/L   CO2 32 22 - 32 mmol/L   Glucose, Bld 240 (H) 65 - 99 mg/dL   BUN <5 (L) 6 - 20 mg/dL   Creatinine, Ser 0.67 0.61 - 1.24 mg/dL   Calcium 8.5 (L) 8.9 - 10.3 mg/dL   Total Protein 5.8 (L) 6.5 - 8.1 g/dL   Albumin 3.3 (L) 3.5 - 5.0 g/dL   AST 20 15 - 41 U/L   ALT 16 (L) 17 - 63 U/L   Alkaline Phosphatase 55 38 - 126 U/L   Total Bilirubin 0.2 (L) 0.3 - 1.2 mg/dL   GFR calc non Af Amer >60 >60 mL/min   GFR calc Af Amer >60 >60 mL/min    Comment: (NOTE) The eGFR has been calculated using the CKD EPI equation. This calculation has not been validated in all clinical situations. eGFR's persistently <60 mL/min signify possible Chronic Kidney Disease.    Anion gap 8 5 - 15  Ethanol     Status: None   Collection Time: 12/21/16  4:28 PM  Result Value Ref Range   Alcohol, Ethyl (B) <5 <5 mg/dL    Comment:        LOWEST DETECTABLE LIMIT FOR SERUM ALCOHOL IS 5 mg/dL FOR MEDICAL PURPOSES ONLY   Salicylate level     Status: None   Collection Time: 12/21/16  4:28 PM  Result Value Ref Range   Salicylate Lvl <3.5 2.8 - 30.0 mg/dL  Acetaminophen level     Status: Abnormal   Collection Time: 12/21/16  4:28 PM  Result Value Ref Range   Acetaminophen (Tylenol), Serum <10 (L) 10 - 30 ug/mL    Comment:        THERAPEUTIC CONCENTRATIONS VARY SIGNIFICANTLY. A RANGE OF 10-30 ug/mL MAY BE AN EFFECTIVE CONCENTRATION FOR MANY PATIENTS. HOWEVER, SOME ARE BEST TREATED AT CONCENTRATIONS OUTSIDE THIS RANGE. ACETAMINOPHEN CONCENTRATIONS >150 ug/mL AT 4 HOURS AFTER INGESTION AND >50 ug/mL AT 12 HOURS AFTER INGESTION ARE OFTEN ASSOCIATED WITH TOXIC REACTIONS.   cbc     Status: Abnormal   Collection Time: 12/21/16  4:28 PM  Result Value Ref Range   WBC 7.3 4.0 - 10.5 K/uL   RBC 4.00 (L) 4.22 - 5.81 MIL/uL   Hemoglobin 12.3 (L) 13.0 - 17.0 g/dL   HCT 34.9 (L) 39.0 - 52.0 %   MCV 87.3 78.0 - 100.0 fL   MCH 30.8 26.0 - 34.0 pg   MCHC 35.2 30.0 - 36.0 g/dL   RDW 13.0 11.5 - 15.5 %    Platelets 267 150 - 400 K/uL  Differential     Status: None   Collection Time: 12/21/16  4:28 PM  Result Value Ref Range   Neutrophils Relative % 57 %   Neutro Abs 4.2 1.7 - 7.7 K/uL   Lymphocytes Relative 33 %   Lymphs Abs 2.5 0.7 - 4.0 K/uL   Monocytes Relative 7 %   Monocytes Absolute 0.5 0.1 - 1.0 K/uL   Eosinophils Relative 3 %   Eosinophils Absolute 0.2 0.0 - 0.7 K/uL   Basophils Relative 0 %   Basophils Absolute 0.0 0.0 - 0.1 K/uL  CBG monitoring, ED     Status: Abnormal  Collection Time: 12/21/16  5:43 PM  Result Value Ref Range   Glucose-Capillary 299 (H) 65 - 99 mg/dL  CBG monitoring, ED     Status: Abnormal   Collection Time: 12/21/16  8:11 PM  Result Value Ref Range   Glucose-Capillary 308 (H) 65 - 99 mg/dL  CBG monitoring, ED     Status: Abnormal   Collection Time: 12/22/16  7:14 AM  Result Value Ref Range   Glucose-Capillary 273 (H) 65 - 99 mg/dL    Current Facility-Administered Medications  Medication Dose Route Frequency Provider Last Rate Last Dose  . alum & mag hydroxide-simeth (MAALOX/MYLANTA) 200-200-20 MG/5ML suspension 30 mL  30 mL Oral Q6H PRN Domenic Moras, PA-C      . cloZAPine (CLOZARIL) tablet 100 mg  100 mg Oral QHS Domenic Moras, PA-C   100 mg at 12/21/16 2103  . fluvoxaMINE (LUVOX) tablet 100 mg  100 mg Oral QHS Domenic Moras, PA-C   100 mg at 12/21/16 2104  . ibuprofen (ADVIL,MOTRIN) tablet 600 mg  600 mg Oral Q8H PRN Domenic Moras, PA-C      . insulin aspart (novoLOG) injection 0-15 Units  0-15 Units Subcutaneous TID WC Domenic Moras, PA-C   8 Units at 12/22/16 6720  . insulin aspart (novoLOG) injection 0-5 Units  0-5 Units Subcutaneous QHS Domenic Moras, PA-C   4 Units at 12/21/16 2102  . insulin aspart (novoLOG) injection 4 Units  4 Units Subcutaneous TID WC Domenic Moras, PA-C   4 Units at 12/22/16 0732  . lisinopril (PRINIVIL,ZESTRIL) tablet 20 mg  20 mg Oral Daily Domenic Moras, PA-C   20 mg at 12/22/16 1047  . metFORMIN (GLUCOPHAGE) tablet 1,000 mg  1,000  mg Oral BID WC Ricquel Foulk, MD      . montelukast (SINGULAIR) tablet 10 mg  10 mg Oral QHS Domenic Moras, PA-C   10 mg at 12/21/16 2104  . nicotine (NICODERM CQ - dosed in mg/24 hours) patch 21 mg  21 mg Transdermal Daily Domenic Moras, PA-C   21 mg at 12/21/16 1756  . ondansetron (ZOFRAN) tablet 4 mg  4 mg Oral Q8H PRN Domenic Moras, PA-C      . pantoprazole (PROTONIX) EC tablet 40 mg  40 mg Oral Daily Izic Stfort, MD      . polyethylene glycol (MIRALAX / GLYCOLAX) packet 17 g  17 g Oral Daily Keymoni Mccaster, MD      . propranolol (INDERAL) tablet 20 mg  20 mg Oral BID Domenic Moras, PA-C   20 mg at 12/22/16 1047  . simvastatin (ZOCOR) tablet 40 mg  40 mg Oral q1800 Domenic Moras, PA-C   40 mg at 12/21/16 2155   Current Outpatient Prescriptions  Medication Sig Dispense Refill  . cloZAPine (CLOZARIL) 100 MG tablet Take 1 tablet (100 mg total) by mouth at bedtime. 30 tablet 1  . fluvoxaMINE (LUVOX) 100 MG tablet Take 1 tablet (100 mg total) by mouth at bedtime. 30 tablet 1  . insulin aspart (NOVOLOG) 100 UNIT/ML injection Inject 10 Units into the skin 3 (three) times daily with meals. 10 mL 11  . insulin glargine (LANTUS) 100 UNIT/ML injection Inject 0.55 mLs (55 Units total) into the skin at bedtime. 10 mL 11  . lisinopril (PRINIVIL,ZESTRIL) 20 MG tablet Take 1 tablet (20 mg total) by mouth daily. 30 tablet 1  . meloxicam (MOBIC) 7.5 MG tablet Take 1 tablet (7.5 mg total) by mouth 2 (two) times daily after a meal. 60 tablet 1  .  metFORMIN (GLUCOPHAGE) 1000 MG tablet Take 1 tablet (1,000 mg total) by mouth 2 (two) times daily with a meal. 60 tablet 1  . montelukast (SINGULAIR) 10 MG tablet Take 1 tablet (10 mg total) by mouth at bedtime. 30 tablet 1  . nicotine (NICODERM CQ - DOSED IN MG/24 HR) 7 mg/24hr patch Place 1 patch (7 mg total) onto the skin daily. 28 patch 0  . pantoprazole (PROTONIX) 40 MG tablet Take 1 tablet (40 mg total) by mouth daily. 30 tablet 1  . polyethylene glycol (MIRALAX  / GLYCOLAX) packet Take 17 g by mouth daily. 14 each 1  . propranolol (INDERAL) 20 MG tablet Take 1 tablet (20 mg total) by mouth 2 (two) times daily. 60 tablet 1  . simvastatin (ZOCOR) 40 MG tablet Take 1 tablet (40 mg total) by mouth daily at 6 PM. 30 tablet 1  . temazepam (RESTORIL) 15 MG capsule Take 1 capsule (15 mg total) by mouth at bedtime. 30 capsule 1    Musculoskeletal: Strength & Muscle Tone: within normal limits Gait & Station: normal Patient leans: [name]  Psychiatric Specialty Exam: Physical Exam  Psychiatric: Judgment normal. His mood appears anxious. His speech is delayed. He is withdrawn. Cognition and memory are normal. He exhibits a depressed mood. He expresses suicidal ideation. He expresses suicidal plans.    Review of Systems  Constitutional: Negative.   HENT: Negative.   Eyes: Negative.   Respiratory: Negative.   Cardiovascular: Negative.   Gastrointestinal: Negative.   Genitourinary: Negative.   Musculoskeletal: Negative.   Skin: Negative.   Neurological: Negative.   Endo/Heme/Allergies: Negative.   Psychiatric/Behavioral: Positive for depression and suicidal ideas. The patient is nervous/anxious.     Blood pressure (!) 94/59, pulse 73, temperature 98.2 F (36.8 C), temperature source Oral, resp. rate 16, height 6' (1.829 m), weight 81.2 kg (179 lb), SpO2 98 %.Body mass index is 24.28 kg/m.  General Appearance: Disheveled  Eye Contact:  Minimal  Speech:  Slow  Volume:  Decreased  Mood:  Depressed and Dysphoric  Affect:  Constricted and Depressed  Thought Process:  Disorganized  Orientation:  Full (Time, Place, and Person)  Thought Content:  Illogical and Paranoid Ideation  Suicidal Thoughts:  Yes.  with intent/plan  Homicidal Thoughts:  No  Memory:  Immediate;   Fair Recent;   Fair Remote;   Fair  Judgement:  Poor  Insight:  Shallow  Psychomotor Activity:  Decreased and Psychomotor Retardation  Concentration:  Concentration: Fair and Attention  Span: Fair  Recall:  [employer] of Knowledge:  Fair  Language:  Good  Akathisia:  No  Handed:  Right  AIMS (if indicated):     Assets:  Communication Skills Desire for Improvement  ADL's:  Intact  Cognition:  WNL  Sleep:   poor     Treatment Plan Summary: Daily contact with patient to assess and evaluate symptoms and progress in treatment and Medication management Restart Clozapine 100 mg qhs and Luvox 100 mg Qhs. Individual counseling Disposition: Recommend psychiatric Inpatient admission when medically cleared.  Corena Pilgrim, MD 12/22/2016 11:19 AM

## 2016-12-22 NOTE — Progress Notes (Signed)
Tony Cunningham is a 41 year old male being admitted voluntarily to [facility] from [facility].  He came to the ED accompanied by his peer support specialist for not taking his medications, unable to get refills and attempted to cut his wrists this past Saturday.  He did report A/V hallucinations but unable to elaborate.  His last inpatient hospitalization was 10/21/16.  He presented as very disorganized and seemed to be responding to internal stimuli.  He has history of asthma, diabetes and hypertension.  He is diagnosed with Schizoaffective Disorder.  He was very tearful during admission.  He denies A/V hallucinations or SI/HI. He is c/o of neck pain 10/10.  RN made aware.  Oriented him to the unit. Admission paperwork completed and signed.  Belongings searched and secured in locker # 11.  Skin assessment completed and no skin issues noted.  Q 15 minute checks initiated for safety.  We will monitor the progress towards his goals.

## 2016-12-22 NOTE — Progress Notes (Signed)
Inpatient Diabetes Program Recommendations  AACE/ADA: New Consensus Statement on Inpatient Glycemic Control (2015)  Target Ranges:  Prepandial:   less than 140 mg/dL      Peak postprandial:   less than 180 mg/dL (1-2 hours)      Critically ill patients:  140 - 180 mg/dL   Results for JEROMY, BORCHERDING (MRN [id number]) as of 12/22/2016 10:23  Ref. Range 12/21/2016 17:43 12/21/2016 20:11 12/22/2016 07:14  Glucose-Capillary Latest Ref Range: 65 - 99 mg/dL 734 (H) 287 (H) 681 (H)   Review of Glycemic Control  Diabetes history: DM 2 Outpatient Diabetes medications: Lantus 55 units, Novolog 10 units tid meal coverage, Metformin 1000 mg BID Current orders for Inpatient glycemic control: Novolog Moderate Correction 0-15 units tid + Novolog HS scale 0-5 units, Novolog 4 units tid meal coverage  Inpatient Diabetes Program Recommendations:    A1c 10.3% on 11/09/16  Glucose elevated in the high 200-300 range. Patient currently on regular diet, consider changing to carb modified diet. Also consider starting a portion of patient's basal insulin, Lantus 25 units (less than half of home dose).  Thanks,  Christena Deem RN, MSN, [facility] Inpatient Diabetes Coordinator Team Pager [phone] (8a-5p)

## 2016-12-22 NOTE — ED Notes (Signed)
This patient has slept all day.  He will arouse but falls immediately back to sleep.  Pt continues to have suicidal ideation but contracts for safety .  Pt is not eating or drinking but took his medication with a cup of water.  Pt is not in any acute distress.  15 minute checks and video monitoring in place.

## 2016-12-22 NOTE — BH Assessment (Signed)
[facility] Assessment Progress Note  Per Thedore Mins, MD, this pt requires psychiatric hospitalization at this time.  Berneice Heinrich, RN, [facility] has assigned pt to [facility] Rm 502-2; they will be ready to receive pt at 16:00.  Pt has signed Voluntary Admission and Consent for Treatment, as well as Consent to Release Information to no one, and signed forms have been faxed to [facility].  Pt's nurse, Kendal Hymen, has been notified, and agrees to send original paperwork along with pt via Juel [facility]row, and to call report to [phone].  Doylene Canning, MA Triage Specialist [phone]

## 2016-12-22 NOTE — Tx Team (Signed)
Initial Treatment Plan 12/22/2016 8:58 PM Tony Cunningham YPP:[id number]    PATIENT STRESSORS: Financial difficulties Medication change or noncompliance  Health problems   PATIENT STRENGTHS: Wellsite geologist fund of knowledge Motivation for treatment/growth Supportive family/friends   PATIENT IDENTIFIED PROBLEMS: Psychosis  Depression  Suicidal ideation  "To get stable"  "Not be depressed"             DISCHARGE CRITERIA:  Improved stabilization in mood, thinking, and/or behavior Motivation to continue treatment in a less acute level of care Verbal commitment to aftercare and medication compliance  PRELIMINARY DISCHARGE PLAN: Outpatient therapy Medication management  PATIENT/FAMILY INVOLVEMENT: This treatment plan has been presented to and reviewed with the patient, Tony Cunningham.  The patient and family have been given the opportunity to ask questions and make suggestions.  Levin Bacon, RN 12/22/2016, 8:58 PM

## 2016-12-23 DIAGNOSIS — F250 Schizoaffective disorder, bipolar type: Secondary | ICD-10-CM

## 2016-12-23 DIAGNOSIS — G4700 Insomnia, unspecified: Secondary | ICD-10-CM

## 2016-12-23 DIAGNOSIS — F17210 Nicotine dependence, cigarettes, uncomplicated: Secondary | ICD-10-CM

## 2016-12-23 DIAGNOSIS — R45851 Suicidal ideations: Secondary | ICD-10-CM

## 2016-12-23 LAB — GLUCOSE, CAPILLARY
Glucose-Capillary: 371 mg/dL — ABNORMAL HIGH (ref 65–99)
Glucose-Capillary: 217 mg/dL — ABNORMAL HIGH (ref 65–99)
Glucose-Capillary: 217 mg/dL — ABNORMAL HIGH (ref 65–99)
Glucose-Capillary: 345 mg/dL — ABNORMAL HIGH (ref 65–99)

## 2016-12-23 MED ORDER — TRAZODONE HCL 50 MG PO TABS
50.0000 mg | ORAL_TABLET | Freq: Every evening | ORAL | Status: DC | PRN
  Administered 2016-12-23: 50 mg via ORAL
  Filled 2016-12-23: qty 1

## 2016-12-23 MED ORDER — SIMVASTATIN 20 MG PO TABS
20.0000 mg | ORAL_TABLET | Freq: Every day | ORAL | Status: DC
  Administered 2016-12-23 – 2017-01-14 (×23): 20 mg via ORAL
  Filled 2016-12-23 (×24): qty 1

## 2016-12-23 NOTE — Progress Notes (Signed)
Inpatient Diabetes Program Recommendations  AACE/ADA: New Consensus Statement on Inpatient Glycemic Control (2015)  Target Ranges:  Prepandial:   less than 140 mg/dL      Peak postprandial:   less than 180 mg/dL (1-2 hours)      Critically ill patients:  140 - 180 mg/dL   Results for PRATHIK, AMAN (MRN [id number]) as of 12/23/2016 10:18  Ref. Range 12/22/2016 07:14 12/22/2016 12:13 12/22/2016 17:49 12/22/2016 21:09  Glucose-Capillary Latest Ref Range: 65 - 99 mg/dL 967 (H) 893 (H) 810 (H) 361 (H)   Results for KELLY, EISLER (MRN [id number]) as of 12/23/2016 10:18  Ref. Range 12/23/2016 06:07  Glucose-Capillary Latest Ref Range: 65 - 99 mg/dL 277 (H)     Review of Glycemic Control  Diabetes history: DM 2  Outpatient Diabetes medications: Lantus 55 units QHS                                  Novolog 10 units TID with meals              Metformin 1000 mg BID  Current Insulin Orders: Novolog Moderate Correction Scale/ SSI (0-15 units) TID AC + HS      Novolog 4 units TID      Metformin 1000 mg BID     MD- Please consider the following in-hospital insulin adjustments:  1. Start Lantus 40 units QHS (75% home dose)  2. Increase Novolog Meal Coverage to 6 units TID with meals (hold if pt eats <50% of meal)       --Will follow patient during hospitalization--  Ambrose Finland RN, MSN, CDE Diabetes Coordinator Inpatient Glycemic Control Team Team Pager: [phone] (8a-5p)

## 2016-12-23 NOTE — Progress Notes (Signed)
Recreation Therapy Notes   Date: 12/23/2016 Time: 10:00am Location: [address] Dayroom  Group Topic: Self-Esteem  Goal Area(s) Addresses:  Pt will be able to identify positive characteristics.  Intervention: Game  Activity: Pts will throw back and forth a beach ball that has self-esteem questions, statements and open-ended sentences. When a pt gets the ball they have to answer the question, say the sentence or finish the sentence aloud.  Education:Self-Esteem, Discharge Planning  Education Outcome: Acknowledges understanding   Clinical Observations/Feedback: Pt did not attend group.  Marvell Fuller, Recreation Therapy Intern  [name], LRT/CTRS

## 2016-12-23 NOTE — Social Work (Signed)
Referred to [facility]itional [facility] Team, is [place] resident.  Anne Cunningham, LCSW Lead Clinical Social Worker Phone:  [phone]  

## 2016-12-23 NOTE — H&P (Signed)
Psychiatric Admission Assessment Adult  Patient Identification: Tony Cunningham  MRN:  [id number]  Date of Evaluation:  12/23/2016  Chief Complaint: Worsening symptoms of Schizoaffective disorder & suicidal ideations with plans to overdose on medications.   Principal Diagnosis: Schizoaffective disorder, Bipolar-type.  Diagnosis:   Patient Active Problem List   Diagnosis Date Noted  . Schizoaffective disorder, bipolar type ([place]) [F25.0] 12/22/2016  . Schizoaffective disorder, depressive type ([place]) [F25.1] 09/23/2016  . Polysubstance abuse [F19.10] 07/17/2016  . Tardive dyskinesia [G24.01] 07/16/2016  . Tobacco use disorder [F17.200] 07/07/2016  . Dyslipidemia [E78.5] 07/07/2016  . Asthma [J45.909] 07/07/2016  . HTN (hypertension) [I10] 07/06/2016  . Diabetes ([place]) [E11.9] 12/25/2010   History of Present Illness: This is an admission assessment for this 41 year old Caucasian male with history of mental illness, chronic. He is an established mental health patient at the [facility] in [place], [place] in [place] under the care of Dr. Algie Coffer for ECT. He is admitted to the [facility] adult unit from the [facility] with complaints of worsening symptoms of depression with suicidal ideations & plans to overdose on medications. Patient was apparently accompanied to the ED by his peer support specialist. Chart review indicated that patient was receiving outpatient psychiatric services at the [facility] clinic in [place], [place]. Report also indicated that he has been out of his mental health medications for at least 3 days. During this assessment, Tony Cunningham seen out of it. He is lying down in his bed, sleeping. He is alert & easily aroused. He is verbally responsive, however, his speech is very slow & incoherent at times. He is currently a poor historian. He reports, "I have been at the [facility] x 2 weeks. I got discharged on Saturday. When I got home, my depression became worse. He brought me to the hospital because  of my behavior. I needed help because I have been suicidal x 5 days".  Objective: [facility]: ECT, last treatment on 12-07-16. On Clazaril 100 mg Q hs.  Associated Signs/Symptoms: Depression Symptoms:  depressed mood, insomnia, psychomotor agitation, difficulty concentrating, suicidal thoughts with specific plan,  (Hypo) Manic Symptoms:  Distractibility, Labiality of Mood,  Anxiety Symptoms:  Excessive Worry,  Psychotic Symptoms:  Hallucinations: Auditory  PTSD Symptoms: NA  Total Time spent with patient: 1 hour  Past Psychiatric History: (Per chart review):Patient has a long history of mental illness and has had multiple hospitalizations including multiple hospitalizations in [place]. Has been in our unit several times. He was just discharged a week ago he is frequently in the emergency department. There is a history of cutting in the past   Is the patient at risk to self? Yes.    Has the patient been a risk to self in the past 6 months? Yes.    Has the patient been a risk to self within the distant past? Yes.    Is the patient a risk to others? No.  Has the patient been a risk to others in the past 6 months? No.  Has the patient been a risk to others within the distant past? No.   Prior Inpatient Therapy: Yes, [facility] x multiple times. Prior Outpatient Therapy: Yes  Alcohol Screening: 1. How often do you have a drink containing alcohol?: Never 3. How often do you have six or more drinks on one occasion?: Never 4. How often during the last year have you found that you were not able to stop drinking once you had started?: Never 5. How often during the last  year have you failed to do what was normally expected from you becasue of drinking?: Never 6. How often during the last year have you needed a first drink in the morning to get yourself going after a heavy drinking session?: Never 7. How often during the last year have you had a feeling of guilt of remorse after drinking?:  Never 8. How often during the last year have you been unable to remember what happened the night before because you had been drinking?: Never 9. Have you or someone else been injured as a result of your drinking?: No 10. Has a relative or friend or a doctor or another health worker been concerned about your drinking or suggested you cut down?: No Alcohol Use Disorder Identification Test Final Score (AUDIT): 0 Brief Intervention: AUDIT score less than 7 or less-screening does not suggest unhealthy drinking-brief intervention not indicated  Substance Abuse History in the last 12 months:  Yes.    Consequences of Substance Abuse: Medical Consequences:  Liver damage, Possible death by overdose Legal Consequences:  Arrests, jail time, Loss of driving privilege. Family Consequences:  Family discord, divorce and or separation.   Previous Psychotropic Medications: Yes, Clozaril, Luvox, Trazodone.  Psychological Evaluations: No   Past Medical History:  Past Medical History:  Diagnosis Date  . Anxiety   . Asthma   . Diabetes mellitus   . High blood pressure   . Sinus complaint    History reviewed. No pertinent surgical history.  Family History: History reviewed. No pertinent family history.  Family Psychiatric  History: Unable to obtain this informations at this time.   Tobacco Screening: Have you used any form of tobacco in the last 30 days? (Cigarettes, Smokeless Tobacco, Cigars, and/or Pipes): Yes Tobacco use, Select all that apply: 5 or more cigarettes per day Are you interested in Tobacco Cessation Medications?: Yes, will notify MD for an order Counseled patient on smoking cessation including recognizing danger situations, developing coping skills and basic information about quitting provided: Refused/Declined practical counseling  Social History:  History  Alcohol Use No     History  Drug Use No    Additional Social History: Patient was living in a group home. Has was there  about 4-6 months he estimates. Says that he does not like it. 11th  grade education. Does not have a legal guardian.  Allergies:  No Known Allergies  Lab Results:  Results for orders placed or performed during the hospital encounter of 12/22/16 (from the past 48 hour(s))  Glucose, capillary     Status: Abnormal   Collection Time: 12/22/16  9:09 PM  Result Value Ref Range   Glucose-Capillary 361 (H) 65 - 99 mg/dL   Comment 1 Notify RN   Glucose, capillary     Status: Abnormal   Collection Time: 12/23/16  6:07 AM  Result Value Ref Range   Glucose-Capillary 371 (H) 65 - 99 mg/dL   Comment 1 Notify RN    Blood Alcohol level:  Lab Results  Component Value Date   ETH <5 12/21/2016   ETH <5 23/55/7322   Metabolic Disorder Labs:  Lab Results  Component Value Date   HGBA1C 10.3 (H) 11/09/2016   MPG 249 11/09/2016   MPG 275 10/09/2016   Lab Results  Component Value Date   PROLACTIN 24.5 (H) 09/24/2016   PROLACTIN 3.4 (L) 07/07/2016   Lab Results  Component Value Date   CHOL 92 11/09/2016   TRIG 54 11/09/2016   HDL 39 (L) 11/09/2016  CHOLHDL 2.4 11/09/2016   VLDL 11 11/09/2016   LDLCALC 42 11/09/2016   LDLCALC 72 10/09/2016   Current Medications: Current Facility-Administered Medications  Medication Dose Route Frequency Provider Last Rate Last Dose  . acetaminophen (TYLENOL) tablet 650 mg  650 mg Oral Q6H PRN Patrecia Pour, NP      . alum & mag hydroxide-simeth (MAALOX/MYLANTA) 200-200-20 MG/5ML suspension 30 mL  30 mL Oral Q6H PRN Patrecia Pour, NP      . cloZAPine (CLOZARIL) tablet 100 mg  100 mg Oral QHS Patrecia Pour, NP   100 mg at 12/22/16 2144  . fluvoxaMINE (LUVOX) tablet 100 mg  100 mg Oral QHS Patrecia Pour, NP   100 mg at 12/22/16 2144  . ibuprofen (ADVIL,MOTRIN) tablet 600 mg  600 mg Oral Q8H PRN Patrecia Pour, NP   600 mg at 12/22/16 2144  . insulin aspart (novoLOG) injection 0-15 Units  0-15 Units Subcutaneous TID WC Patrecia Pour, NP   15 Units  at 12/23/16 [phone]  . insulin aspart (novoLOG) injection 0-5 Units  0-5 Units Subcutaneous QHS Patrecia Pour, NP   5 Units at 12/22/16 2144  . insulin aspart (novoLOG) injection 4 Units  4 Units Subcutaneous TID WC Patrecia Pour, NP   4 Units at 12/23/16 0737  . lisinopril (PRINIVIL,ZESTRIL) tablet 20 mg  20 mg Oral Daily Patrecia Pour, NP   20 mg at 12/23/16 1062  . magnesium hydroxide (MILK OF MAGNESIA) suspension 30 mL  30 mL Oral Daily PRN Patrecia Pour, NP      . metFORMIN (GLUCOPHAGE) tablet 1,000 mg  1,000 mg Oral BID WC Patrecia Pour, NP   1,000 mg at 12/23/16 0825  . montelukast (SINGULAIR) tablet 10 mg  10 mg Oral QHS Patrecia Pour, NP   10 mg at 12/22/16 2144  . nicotine polacrilex (NICORETTE) gum 2 mg  2 mg Oral PRN Eappen, Ria Clock, MD      . ondansetron (ZOFRAN) tablet 4 mg  4 mg Oral Q8H PRN Patrecia Pour, NP      . pantoprazole (PROTONIX) EC tablet 40 mg  40 mg Oral Daily Patrecia Pour, NP   40 mg at 12/23/16 6948  . polyethylene glycol (MIRALAX / GLYCOLAX) packet 17 g  17 g Oral Daily Patrecia Pour, NP   17 g at 12/23/16 0825  . propranolol (INDERAL) tablet 20 mg  20 mg Oral BID Patrecia Pour, NP   20 mg at 12/23/16 5462  . simvastatin (ZOCOR) tablet 40 mg  40 mg Oral q1800 Patrecia Pour, NP      . traZODone (DESYREL) tablet 50 mg  50 mg Oral QHS,MR X 1 Laverle Hobby, PA-C   Stopped at 12/22/16 2300   PTA Medications: Prescriptions Prior to Admission  Medication Sig Dispense Refill Last Dose  . cloZAPine (CLOZARIL) 100 MG tablet Take 1 tablet (100 mg total) by mouth at bedtime. 30 tablet 1 Past Week at Unknown time  . fluvoxaMINE (LUVOX) 100 MG tablet Take 1 tablet (100 mg total) by mouth at bedtime. 30 tablet 1 Past Week at Unknown time  . insulin aspart (NOVOLOG) 100 UNIT/ML injection Inject 10 Units into the skin 3 (three) times daily with meals. 10 mL 11 Past Week at Unknown time  . insulin glargine (LANTUS) 100 UNIT/ML injection Inject 0.55 mLs (55  Units total) into the skin at bedtime. 10 mL 11 Past Week at Unknown time  .  lisinopril (PRINIVIL,ZESTRIL) 20 MG tablet Take 1 tablet (20 mg total) by mouth daily. 30 tablet 1 Past Week at Unknown time  . meloxicam (MOBIC) 7.5 MG tablet Take 1 tablet (7.5 mg total) by mouth 2 (two) times daily after a meal. 60 tablet 1 Past Week at Unknown time  . metFORMIN (GLUCOPHAGE) 1000 MG tablet Take 1 tablet (1,000 mg total) by mouth 2 (two) times daily with a meal. 60 tablet 1 Past Week at Unknown time  . montelukast (SINGULAIR) 10 MG tablet Take 1 tablet (10 mg total) by mouth at bedtime. 30 tablet 1 Past Week at Unknown time  . nicotine (NICODERM CQ - DOSED IN MG/24 HR) 7 mg/24hr patch Place 1 patch (7 mg total) onto the skin daily. 28 patch 0 Past Week at Unknown time  . pantoprazole (PROTONIX) 40 MG tablet Take 1 tablet (40 mg total) by mouth daily. 30 tablet 1 Past Week at Unknown time  . polyethylene glycol (MIRALAX / GLYCOLAX) packet Take 17 g by mouth daily. 14 each 1 Past Week at Unknown time  . propranolol (INDERAL) 20 MG tablet Take 1 tablet (20 mg total) by mouth 2 (two) times daily. 60 tablet 1 Past Week at Unknown time  . simvastatin (ZOCOR) 40 MG tablet Take 1 tablet (40 mg total) by mouth daily at 6 PM. 30 tablet 1 Past Week at Unknown time  . temazepam (RESTORIL) 15 MG capsule Take 1 capsule (15 mg total) by mouth at bedtime. 30 capsule 1 Past Week at Unknown time   Musculoskeletal: Strength & Muscle Tone: within normal limits Gait & Station: normal Patient leans: [name]  Psychiatric Specialty Exam: Physical Exam  Constitutional: He appears well-developed.  HENT:  Head: Normocephalic.  Eyes: Pupils are equal, round, and reactive to light.  Neck: Normal range of motion.  Cardiovascular: Normal rate.   Respiratory: Effort normal.  GI: Soft.  Genitourinary:  Genitourinary Comments: Deferred  Neurological: He is alert.  Oriented to self.  Skin: Skin is warm.    Review of Systems   Constitutional: Positive for malaise/fatigue.  HENT: Negative.   Eyes: Negative.   Cardiovascular: Negative.   Gastrointestinal: Negative.   Genitourinary: Negative.   Musculoskeletal: Negative.   Skin: Negative.   Neurological: Positive for weakness.  Endo/Heme/Allergies: Negative.   Psychiatric/Behavioral: Positive for depression, hallucinations, substance abuse (Hx. Benzodiazepine use) and suicidal ideas. Negative for memory loss. The patient is nervous/anxious and has insomnia.     Blood pressure 107/69, pulse 89, temperature 99.1 F (37.3 C), temperature source Oral, resp. rate 18, height 6' (1.829 m), weight 81.4 kg (179 lb 8 oz), SpO2 100 %.Body mass index is 24.34 kg/m.  General Appearance: Disheveled, sluggish, not making eye contact.  Eye Contact:  Poor  Speech:  Incoherent at times,, slow rate  Volume:  Decreased  Mood:  Depressed  Affect:  Flat and Restricted  Thought Process:  Disorganized and Irrelevant  Orientation:  Other:  Oriented to self & place.  Thought Content:  Rumination, denies any hallucinations, delusions or paranoia.  Suicidal Thoughts:  Yes.  without intent/plan  Homicidal Thoughts:  Denies any thoughts, plans or intent.  Memory:  Immediate;   Fair Recent;   Poor Remote;   Poor  Judgement:  Impaired  Insight:  Fair  Psychomotor Activity:  Decreased  Concentration:  Concentration: Poor and Attention Span: Poor  Recall:  Poor  Fund of Knowledge:  Poor  Language:  Fair  Akathisia:  Negative  Handed:  Right  AIMS (  if indicated):     Assets:  Desire for Improvement  ADL's:  Intact  Cognition:  Impaired,  Mild  Sleep:      Treatment Plan/Recommendations: 1. Admit for crisis management and stabilization, estimated length of stay 3-5 days.  2. Medication management to reduce current symptoms to base line and improve the patient's overall level of functioning: Resumed on Clozaril 100 mg for mood control, FluvoxaMine (Luvox) 100 mg for depression,  Trazodone 50 mg for insomnia, 3. Treat health problems as indicated.  4. Develop treatment plan to decrease risk of relapse upon discharge and the need for readmission.  5. Psycho-social education regarding relapse prevention and self care.  6. Health care follow up as needed for medical problems.  7. Review, reconcile, and reinstate any pertinent home medications for other health issues where appropriate. 8. Call for consults with hospitalist for any additional specialty patient care services as needed.  Observation Level/Precautions:  15 minute checks  Laboratory:  Per ED  Psychotherapy: Group sessions  Medications: See [facility]    Consultations: As needed   Discharge Concerns: Safety, mood stability.   Estimated LOS: 5-7 days  Other: Admit to the [facility].   Physician Treatment Plan for Primary Diagnosis: Will initiate medication management for mood stability. Set up an outpatient psychiatric services for medication management. Will encourage medication adherence with psychiatric medications.  Long Term Goal(s): Improvement in symptoms so as ready for discharge  Short Term Goals: Ability to identify changes in lifestyle to reduce recurrence of condition will improve, Ability to verbalize feelings will improve, Ability to disclose and discuss suicidal ideas and Ability to demonstrate self-control will improve  Physician Treatment Plan for Secondary Diagnosis: Active Problems:   Schizoaffective disorder, bipolar type ([place])  Long Term Goal(s): Improvement in symptoms so as ready for discharge  Short Term Goals: Ability to identify and develop effective coping behaviors will improve and Compliance with prescribed medications will improve  I certify that inpatient services furnished can reasonably be expected to improve the patient's condition.    Encarnacion Slates, NP, PMHNP, FNP-BC 8/1/20189:39 AM  I have reviewed case with NP and have met with patient Agree with NP note and  assessment Patient is a 41 year old male, who presented to ED on 7/30 due to worsening depression and suicidal ideations of overdosing ,as well as recent thoughts of cutting wrists . Of note, patient has a history of chronic psychiatric illness and chart notes indicate history of severe Bipolar Depression , as well as a history of Schizoaffective Disorder in the past . He has had several prior psychiatric admissions, both at [facility] and more recently at [facility]. He has been off psychiatric medications x several days. Of note, in reviewing chart, patient was recently undergoing ECT course under the supervision of Dr. Weber Cooks at [facility], and received ECT # 7 on 7/16. Notes indicate patient had been responding partially to ECT . At this time patient is a fair historian, presents in bed , fairly related, answering questions with short phrases or monosyllables,  reports depression, recent suicidal ideations of cutting wrists. Denies hallucinations. ( Has history of auditory hallucinations during prior admissions) . Patient is currently on Luvox , Clozaril. He does not endorse medication side effects. Most recent CBC is from 7/30 - WBC 7.3, ANC 4.2  Medical History is remarkable for DM.  Dx- Schizoaffective Disorder versus Bipolar Disorder, Depressed, Severe  Plan- Inpatient admission, patient has been continued on outpatient medication regimen- Luvox at 100 mgrs  QHS, Clozaril 100 mgrs QHS. Titrate gradually as tolerated . Decrease Zocor to 20 mgr QDAY as recommended due to potential interaction with Fluvoxamine. Check EKG to rule out QTc prolongation.  * As patient was recently undergoing  ECT course and seemed to be responding , would consider restarting ECT if he agrees- will continue to discuss this option with patient and if interested would consider transfer to  to resume ECT under Dr. Weber Cooks.

## 2016-12-23 NOTE — [facility] Suicide Risk Assessment (Addendum)
[facility] Admission Suicide Risk Assessment   Nursing information obtained from:  Demographic factors:  Male Current Mental Status:  NA Loss Factors:  NA Historical Factors:  NA Risk Reduction Factors:  Positive therapeutic relationship  Total Time spent with patient: 45 minutes Principal Problem: Schizoaffective Disorder  Diagnosis:   Patient Active Problem List   Diagnosis Date Noted  . Schizoaffective disorder, bipolar type (HCC) [F25.0] 12/22/2016  . Schizoaffective disorder, depressive type (H[facility]) [F25.1] 09/23/2016  . Polysubstance abuse [F19.10] 07/17/2016  . Tardive dyskinesia [G24.01] 07/16/2016  . Tobacco use disorder [F17.200] 07/07/2016  . Dyslipidemia [E78.5] 07/07/2016  . Asthma [J45.909] 07/07/2016  . HTN (hypertension) [I10] 07/06/2016  . Diabetes (H[facility]) [E11.9] 12/25/2010    Continued Clinical Symptoms:  Alcohol Use Disorder Identification Test Final Score (AUDIT): 0 The "Alcohol Use Disorders Identification Test", Guidelines for Use in Primary Care, Second Edition.  World Science writer [facility]). Score between 0-7:  no or low risk or alcohol related problems. Score between 8-15:  moderate risk of alcohol related problems. Score between 16-19:  high risk of alcohol related problems. Score 20 or above:  warrants further diagnostic evaluation for alcohol dependence and treatment.   CLINICAL FACTORS:  Patient is a 41 year old male, who presented to ED on 7/30 due to worsening depression and suicidal ideations of overdosing ,as well as recent thoughts of cutting wrists . Of note, patient has a history of chronic psychiatric illness and chart notes indicate history of severe Bipolar Depression , as well as a history of Schizoaffective Disorder in the past . He has had several prior psychiatric admissions, both at [facility] and more recently at [facility]. He has been off psychiatric medications x several days. Of note, in reviewing chart, patient was recently undergoing ECT course  under the supervision of Dr. Toni Amend at [facility], and received ECT # 7 on 7/16. Notes indicate patient had been responding partially to ECT . At this time patient is a fair historian, presents in bed , fairly related, answering questions with short phrases or monosyllables,  reports depression, recent suicidal ideations of cutting wrists. Denies hallucinations. ( Has history of auditory hallucinations during prior admissions) . Patient is currently on Luvox , Clozaril. He does not endorse medication side effects. Most recent CBC is from 7/30 - WBC 7.3, ANC 4.2  Medical History is remarkable for DM.  Dx- Schizoaffective Disorder versus Bipolar Disorder, Depressed, Severe  Plan- Inpatient admission, patient has been continued on outpatient medication regimen- Luvox at 100 mgrs QHS, Clozaril 100 mgrs QHS. Titrate gradually as tolerated . Decrease Zocor to 20 mgr QDAY as recommended due to potential interaction with Fluvoxamine. Check EKG to rule out QTc prolongation.  * As patient was recently undergoing  ECT course and seemed to be responding , would consider restarting ECT if he agrees- will continue to discuss this option with patient and if interested would consider transfer to [place] to resume ECT under Dr. Toni Amend.         Musculoskeletal: Strength & Muscle Tone: within normal limits Gait & Station: gait not examined at this time, patient in bed and not wanting to get up at this time Patient leans: [name]  Psychiatric Specialty Exam: Physical Exam  ROS denies chest pain, no shortness of breath  Blood pressure 107/69, pulse 89, temperature 98.2 F (36.8 C), resp. rate 18, height 6' (1.829 m), weight 81.4 kg (179 lb 8 oz), SpO2 100 %.Body mass index is 24.34 kg/m.  General Appearance: Fairly Groomed  Eye Contact:  Fair  Speech:  Slow- poor speech, communicates in monosyllables or short phrases   Volume:  Decreased  Mood:  depressed   Affect:  blunted , vaguely irritable   Thought Process:  Linear, slowed.   Orientation:  Other:  he is alert and attentive, he is partially oriented, knows he is at [facility], knows day, but not year. Poor effort may be affecting presentation.   Thought Content:  denies hallucinations, and does not appear internally preoccupied at present  Suicidal Thoughts:  contracts for safety, denies current suicidal plan or intention at this time  Homicidal Thoughts:  No denies homicidal or violent ideations  Memory:  recall 3/3 immediate and 3/3 at 3 minutes   Judgement:  Fair  Insight:  Fair  Psychomotor Activity:  Decreased- in bed   Concentration:  Concentration: Fair and Attention Span: Fair  Recall:  [employer] of Knowledge:  Fair  Language:  Fair  Akathisia:  Negative  Handed:  Right  AIMS (if indicated):     Assets:  Desire for Improvement Resilience  ADL's:  Impaired  Cognition:  Impaired,  Mild  Sleep:         COGNITIVE FEATURES THAT CONTRIBUTE TO RISK:  Closed-mindedness and Loss of executive function    SUICIDE RISK:   Moderate:  Frequent suicidal ideation with limited intensity, and duration, some specificity in terms of plans, no associated intent, good self-control, limited dysphoria/symptomatology, some risk factors present, and identifiable protective factors, including available and accessible social support.  PLAN OF CARE: Patient will be admitted to inpatient psychiatric unit for stabilization and safety. Will provide and encourage milieu participation. Provide medication management and maked adjustments as needed.  Will follow daily.    I certify that inpatient services furnished can reasonably be expected to improve the patient's condition.   Craige Cotta, MD 12/23/2016, 4:16 PM

## 2016-12-23 NOTE — Progress Notes (Signed)
Results for GATES, JIVIDEN (MRN [id number]) as of 12/23/2016 06:32  Ref. Range 12/23/2016 06:07  Glucose-Capillary Latest Ref Range: 65 - 99 mg/dL 536 (H)    Pt sweaty and warm to touch. Pt appears tired and drowsy. Vitals WNL. No c/o. Medication administered. See MAR. Encourage to push fluids; pitcher of water given.

## 2016-12-23 NOTE — [facility] Counselor (Signed)
Adult Comprehensive Assessment  Patient XB:[id number] Nazar, maleDOB:01-Jul-1975, 41 y.o.MEQ:[id number]  Information Source: Information source: Patient  Current Stressors:  Educational / Learning stressors: n/a Employment / Job issues: Pt is on disability. Family Relationships: n/a Surveyor, quantity / Lack of resources (include bankruptcy): n/a Housing / Lack of housing: Pt reports he cannot return home to mother. He would like to go to an ALF.  Physical health (include injuries &life threatening diseases): diabetes, hypertension, dyslipidemia, asthma, and tardive dyskinesia.  Social relationships: n/a Substance abuse: n/a Bereavement / Loss: n/a   Living/Environment/Situation:  Living Arrangements: Parents  Living conditions (as described by patient or guardian): "I can't go back." He would not elaborate as to why he could not return.  How long has patient lived in current situation?: 2 weeks  What is atmosphere in current home: Temporary   Family History:  Marital status: Single Are you sexually active?: Yes What is your sexual orientation?: heterosexual Has your sexual activity been affected by drugs, alcohol, medication, or emotional stress?: none Does patient have children?: No  Childhood History:  By whom was/is the patient raised?: Both parents Additional childhood history information: Pt reports having a good childhood. No issues reported.  Description of patient's relationship with caregiver when they were a child: Pt reports being closer to his father.  Patient's description of current relationship with people who raised him/her: Pt sees his parents weekly. Pt reports he gets along with his parents. How were you disciplined when you got in trouble as a child/adolescent?: appropriate discipline Does patient have siblings?: Yes Number of Siblings: 3 Description of patient's current relationship with siblings: 2 brothers, 1 sister - Pt reports being close to 1 brother  and his sister.  Did patient suffer any verbal/emotional/physical/sexual abuse as a child?: No Did patient suffer from severe childhood neglect?: No Has patient ever been sexually abused/assaulted/raped as an adolescent or adult?: No Was the patient ever a victim of a crime or a disaster?: No Witnessed domestic violence?: No Has patient been effected by domestic violence as an adult?: No  Education:  Highest grade of school patient has completed: 11 Currently a Consulting civil engineer?: No Learning disability?: (unsure)  Employment/Work Situation:  Employment situation: On disability Why is patient on disability: mental health How long has patient been on disability: "all my life" Patient's job has been impacted by current illness: No What is the longest time patient has a held a job?: [employer] Where was the patient employed at that time?: 8-11 years Has patient ever been in the [employer]?: No Are There Guns or Other Weapons in Your Home?: No  Financial Resources:  Surveyor, quantity resources: Writer Does patient have a Lawyer or guardian?: No  Alcohol/Substance Abuse:  What has been your use of drugs/alcohol within the last 12 months?: Pt denies any alcohol or drug use If attempted suicide, did drugs/alcohol play a role in this?: No Alcohol/Substance Abuse Treatment Hx: Denies past history Has alcohol/substance abuse ever caused legal problems?: No  Social Support System: Conservation officer, nature Support System: Fair Museum/gallery exhibitions officer System: mom Type of faith/religion: Scientist, research (life sciences):  Leisure and Hobbies: video games  Strengths/Needs:  What things does the patient do well?: Friendly, resilient In what areas does patient struggle / problems for patient: depression, passive suicidal thoughts.   Discharge Plan:  Does patient have access to transportation?: Yes Will patient be returning to same living situation after  discharge?:No, he states he cannot return.   Currently receiving community mental health services:  Yes (From Whom) [facility].  Does patient have financial barriers related to discharge medications?: No  Summary/Recommendations:   Patient is a  41 year old male admitted with a diagnosis of Schizoaffective Disorder. Patient presented to the hospital with depression, SI and AVH. Patient reports primary triggers for admission were medication noncompliance. Patient will benefit from crisis stabilization, medication evaluation, group therapy and psycho education in addition to case management for discharge. At discharge, it is recommended that patient remain compliant with established discharge plan and continued treatment.   Rondall Allegra MSW, LCSW  12/23/2016

## 2016-12-23 NOTE — Progress Notes (Signed)
Patient ID: Tony Cunningham, male   DOB: 02/17/1976, 41 y.o.   MRN: [id number] PER STATE REGULATIONS 482.30  THIS CHART WAS REVIEWED FOR MEDICAL NECESSITY WITH RESPECT TO THE PATIENT'S ADMISSION/DURATION OF STAY.  NEXT REVIEW DATE:12/26/16  Loura Halt, RN, BSN CASE MANAGER

## 2016-12-23 NOTE — Tx Team (Addendum)
Interdisciplinary Treatment and Diagnostic Plan Update  12/23/2016 Time of Session: 2:56 PM  Knowledge Escandon MRN: [id number]  Principal Diagnosis: Schizoaffective Disorder, Bipolar Type  Secondary Diagnoses: Active Problems:   Schizoaffective disorder, bipolar type (HCC)   Current Medications:  Current Facility-Administered Medications  Medication Dose Route Frequency Provider Last Rate Last Dose  . acetaminophen (TYLENOL) tablet 650 mg  650 mg Oral Q6H PRN Patrecia Pour, NP      . alum & mag hydroxide-simeth (MAALOX/MYLANTA) 200-200-20 MG/5ML suspension 30 mL  30 mL Oral Q6H PRN Patrecia Pour, NP      . cloZAPine (CLOZARIL) tablet 100 mg  100 mg Oral QHS Patrecia Pour, NP   100 mg at 12/22/16 2144  . fluvoxaMINE (LUVOX) tablet 100 mg  100 mg Oral QHS Patrecia Pour, NP   100 mg at 12/22/16 2144  . ibuprofen (ADVIL,MOTRIN) tablet 600 mg  600 mg Oral Q8H PRN Patrecia Pour, NP   600 mg at 12/22/16 2144  . insulin aspart (novoLOG) injection 0-15 Units  0-15 Units Subcutaneous TID WC Patrecia Pour, NP   4 Units at 12/23/16 1324  . insulin aspart (novoLOG) injection 0-5 Units  0-5 Units Subcutaneous QHS Patrecia Pour, NP   5 Units at 12/22/16 2144  . insulin aspart (novoLOG) injection 4 Units  4 Units Subcutaneous TID WC Patrecia Pour, NP   4 Units at 12/23/16 1324  . lisinopril (PRINIVIL,ZESTRIL) tablet 20 mg  20 mg Oral Daily Patrecia Pour, NP   20 mg at 12/23/16 4098  . magnesium hydroxide (MILK OF MAGNESIA) suspension 30 mL  30 mL Oral Daily PRN Patrecia Pour, NP      . metFORMIN (GLUCOPHAGE) tablet 1,000 mg  1,000 mg Oral BID WC Patrecia Pour, NP   1,000 mg at 12/23/16 0825  . montelukast (SINGULAIR) tablet 10 mg  10 mg Oral QHS Patrecia Pour, NP   10 mg at 12/22/16 2144  . nicotine polacrilex (NICORETTE) gum 2 mg  2 mg Oral PRN Eappen, Ria Clock, MD      . ondansetron (ZOFRAN) tablet 4 mg  4 mg Oral Q8H PRN Patrecia Pour, NP      . pantoprazole (PROTONIX) EC tablet 40  mg  40 mg Oral Daily Patrecia Pour, NP   40 mg at 12/23/16 1191  . polyethylene glycol (MIRALAX / GLYCOLAX) packet 17 g  17 g Oral Daily Patrecia Pour, NP   17 g at 12/23/16 0825  . propranolol (INDERAL) tablet 20 mg  20 mg Oral BID Patrecia Pour, NP   20 mg at 12/23/16 4782  . simvastatin (ZOCOR) tablet 40 mg  40 mg Oral q1800 Patrecia Pour, NP      . traZODone (DESYREL) tablet 50 mg  50 mg Oral QHS,MR X 1 Laverle Hobby, PA-C   Stopped at 12/22/16 2300    PTA Medications: Prescriptions Prior to Admission  Medication Sig Dispense Refill Last Dose  . cloZAPine (CLOZARIL) 100 MG tablet Take 1 tablet (100 mg total) by mouth at bedtime. 30 tablet 1 Past Week at Unknown time  . fluvoxaMINE (LUVOX) 100 MG tablet Take 1 tablet (100 mg total) by mouth at bedtime. 30 tablet 1 Past Week at Unknown time  . insulin aspart (NOVOLOG) 100 UNIT/ML injection Inject 10 Units into the skin 3 (three) times daily with meals. 10 mL 11 Past Week at Unknown time  . insulin glargine (LANTUS) 100 UNIT/ML  injection Inject 0.55 mLs (55 Units total) into the skin at bedtime. 10 mL 11 Past Week at Unknown time  . lisinopril (PRINIVIL,ZESTRIL) 20 MG tablet Take 1 tablet (20 mg total) by mouth daily. 30 tablet 1 Past Week at Unknown time  . meloxicam (MOBIC) 7.5 MG tablet Take 1 tablet (7.5 mg total) by mouth 2 (two) times daily after a meal. 60 tablet 1 Past Week at Unknown time  . metFORMIN (GLUCOPHAGE) 1000 MG tablet Take 1 tablet (1,000 mg total) by mouth 2 (two) times daily with a meal. 60 tablet 1 Past Week at Unknown time  . montelukast (SINGULAIR) 10 MG tablet Take 1 tablet (10 mg total) by mouth at bedtime. 30 tablet 1 Past Week at Unknown time  . nicotine (NICODERM CQ - DOSED IN MG/24 HR) 7 mg/24hr patch Place 1 patch (7 mg total) onto the skin daily. 28 patch 0 Past Week at Unknown time  . pantoprazole (PROTONIX) 40 MG tablet Take 1 tablet (40 mg total) by mouth daily. 30 tablet 1 Past Week at Unknown time   . polyethylene glycol (MIRALAX / GLYCOLAX) packet Take 17 g by mouth daily. 14 each 1 Past Week at Unknown time  . propranolol (INDERAL) 20 MG tablet Take 1 tablet (20 mg total) by mouth 2 (two) times daily. 60 tablet 1 Past Week at Unknown time  . simvastatin (ZOCOR) 40 MG tablet Take 1 tablet (40 mg total) by mouth daily at 6 PM. 30 tablet 1 Past Week at Unknown time  . temazepam (RESTORIL) 15 MG capsule Take 1 capsule (15 mg total) by mouth at bedtime. 30 capsule 1 Past Week at Unknown time    Patient Stressors: Financial difficulties Medication change or noncompliance  Patient Strengths: Network engineer for treatment/growth Supportive family/friends  Treatment Modalities: Medication Management, Group therapy, Case management,  1 to 1 session with clinician, Psychoeducation, Recreational therapy.   Physician Treatment Plan for Primary Diagnosis: Schizoaffective Disorder, Bipolar Type Long Term Goal(s): Improvement in symptoms so as ready for discharge  Short Term Goals: Ability to identify changes in lifestyle to reduce recurrence of condition will improve Ability to verbalize feelings will improve Ability to disclose and discuss suicidal ideas Ability to demonstrate self-control will improve Ability to identify and develop effective coping behaviors will improve Compliance with prescribed medications will improve  Medication Management: Evaluate patient's response, side effects, and tolerance of medication regimen.  Therapeutic Interventions: 1 to 1 sessions, Unit Group sessions and Medication administration.  Evaluation of Outcomes: Progressing  Physician Treatment Plan for Secondary Diagnosis: Active Problems:   Schizoaffective disorder, bipolar type ([place]   Long Term Goal(s): Improvement in symptoms so as ready for discharge  Short Term Goals: Ability to identify changes in lifestyle to reduce recurrence of condition will  improve Ability to verbalize feelings will improve Ability to disclose and discuss suicidal ideas Ability to demonstrate self-control will improve Ability to identify and develop effective coping behaviors will improve Compliance with prescribed medications will improve  Medication Management: Evaluate patient's response, side effects, and tolerance of medication regimen.  Therapeutic Interventions: 1 to 1 sessions, Unit Group sessions and Medication administration.  Evaluation of Outcomes: Progressing   RN Treatment Plan for Primary Diagnosis: Schizoaffective Disorder, Bipolar Type Long Term Goal(s): Knowledge of disease and therapeutic regimen to maintain health will improve  Short Term Goals: Ability to identify and develop effective coping behaviors will improve and Compliance with prescribed medications will improve  Medication Management: RN  will administer medications as ordered by provider, will assess and evaluate patient's response and provide education to patient for prescribed medication. RN will report any adverse and/or side effects to prescribing provider.  Therapeutic Interventions: 1 on 1 counseling sessions, Psychoeducation, Medication administration, Evaluate responses to treatment, Monitor vital signs and CBGs as ordered, Perform/monitor CIWA, COWS, AIMS and Fall Risk screenings as ordered, Perform wound care treatments as ordered.  Evaluation of Outcomes: Progressing   LCSW Treatment Plan for Primary Diagnosis: Schizoaffective Disorder, Bipolar Type Long Term Goal(s): Safe transition to appropriate next level of care at discharge, Engage patient in therapeutic group addressing interpersonal concerns.  Short Term Goals: Engage patient in aftercare planning with referrals and resources  Therapeutic Interventions: Assess for all discharge needs, 1 to 1 time with Social worker, Explore available resources and support systems, Assess for adequacy in community support  network, Educate family and significant other(s) on suicide prevention, Complete Psychosocial Assessment, Interpersonal group therapy.  Evaluation of Outcomes: Met  Return home, follow up outpt   Progress in Treatment: Attending groups: No Participating in groups: No Taking medication as prescribed: Yes Toleration medication: Yes, no side effects reported at this time Family/Significant other contact made: No Patient understands diagnosis: Yes AEB asking for help with suicidal thoughts Discussing patient identified problems/goals with staff: Yes Medical problems stabilized or resolved: Yes Denies suicidal/homicidal ideation: Yes Issues/concerns per patient self-inventory: None Other: [name]  New problem(s) identified: None identified at this time.   New Short Term/Long Term Goal(s): None identified at this time.   Discharge Plan or Barriers: return home w family and follow up outpatient, consider group home placeent  Reason for Continuation of Hospitalization:   Depression Medication stabilization Suicidal ideation   Estimated Length of Stay: 3-5 days; 12/29/16  Attendees: Patient: 12/23/2016  2:56 PM  Physician: Hampton Abbot, MD 12/23/2016  2:56 PM  Nursing: Sena Hitch, RN 12/23/2016  2:56 PM  RN Care Manager: Lars Pinks, RN 12/23/2016  2:56 PM  Social Worker: Ripley Fraise 12/23/2016  2:56 PM  Recreational Therapist: Winfield Cunas 12/23/2016  2:56 PM  Other: Norberto Sorenson 12/23/2016  2:56 PM  Other:  12/23/2016  2:56 PM    Scribe for Treatment Team:  Roque Lias LCSW 12/23/2016 2:56 PM

## 2016-12-23 NOTE — Progress Notes (Signed)
Adult Psychoeducational Group Note  Date:  12/23/2016 Time:  9:39 PM  Group Topic/Focus:  Wrap-Up Group:   The focus of this group is to help patients review their daily goal of treatment and discuss progress on daily workbooks.  Participation Level:  Minimal  Participation Quality:  Drowsy and Resistant  Affect:  Depressed  Cognitive:  Lacking  Insight: Lacking  Engagement in Group:  Lacking and Poor  Modes of Intervention:  Clarification  Additional Comments: Patient stated he is depressed and hasn't attended other groups. Patient didn't say anything else.  Patty Sermons 12/23/2016, 9:39 PM

## 2016-12-23 NOTE — Progress Notes (Addendum)
  DATA ACTION RESPONSE  Objective- Pt. is visible in the dayroom, seen watching TV and eating a snack. Presents with a flat/depressed affect and mood. Pt was guarded and minimal with interaction.No further c/o. No abnormal s/s.  Subjective- Denies having any SI/HI/AVH at this time. Rates pain 10/10; neck. Is cooperative and remain safe on the unit.  1:1 interaction in private to establish rapport. PRN Ibuprofen requested and given. Will re-eval. BS 361 this evening. Diabetic management education given. Pt needs further education and encouragement on proper nutritional alternatives.   Safety maintained with Q 15 checks. Continue with POC.

## 2016-12-23 NOTE — [facility] Group Notes (Signed)
[facility] Group Notes:  (Counselor/Nursing/MHT/Case Management/Adjunct)  12/23/2016 1:15PM  Type of Therapy:  Group Therapy  Participation Level:  Active  Participation Quality:  Appropriate  Affect:  Flat  Cognitive:  Oriented  Insight:  Improving  Engagement in Group:  Limited  Engagement in Therapy:  Limited  Modes of Intervention:  Discussion, Exploration and Socialization  Summary of Progress/Problems: The topic for group was balance in life.  Pt participated in the discussion about when their life was in balance and out of balance and how this feels.  Pt discussed ways to get back in balance and short term goals they can work on to get where they want to be. Invited.  Chose to not attend.   [name] 12/23/2016 2:55 PM

## 2016-12-23 NOTE — Progress Notes (Signed)
Patient ID: Tony Cunningham, male   DOB: 1975-07-06, 41 y.o.   MRN: [id number] D:Affect is flat,sad at times blunted,mood seems depressed. Pt has isolated in his room ,mostly in bed. Attended no groups today but had made himself available for medications and has been compliant with meds thus far. Rates his depression as a 7 and anxiety as an 8 while stating his goal is :to take it easy today" . A:Support and encouragement offered. R:Continues to remain in bed except for coming out for meds. No complaints of pain or problems at this time.

## 2016-12-24 DIAGNOSIS — E7800 Pure hypercholesterolemia, unspecified: Secondary | ICD-10-CM

## 2016-12-24 DIAGNOSIS — E119 Type 2 diabetes mellitus without complications: Secondary | ICD-10-CM

## 2016-12-24 DIAGNOSIS — F419 Anxiety disorder, unspecified: Secondary | ICD-10-CM

## 2016-12-24 DIAGNOSIS — F17203 Nicotine dependence unspecified, with withdrawal: Secondary | ICD-10-CM

## 2016-12-24 DIAGNOSIS — I10 Essential (primary) hypertension: Secondary | ICD-10-CM

## 2016-12-24 LAB — GLUCOSE, CAPILLARY
GLUCOSE-CAPILLARY: 435 mg/dL — AB (ref 65–99)
GLUCOSE-CAPILLARY: 466 mg/dL — AB (ref 65–99)
GLUCOSE-CAPILLARY: 423 mg/dL — AB (ref 65–99)
Glucose-Capillary: 310 mg/dL — ABNORMAL HIGH (ref 65–99)
Glucose-Capillary: 448 mg/dL — ABNORMAL HIGH (ref 65–99)
Glucose-Capillary: 352 mg/dL — ABNORMAL HIGH (ref 65–99)

## 2016-12-24 MED ORDER — INSULIN ASPART 100 UNIT/ML ~~LOC~~ SOLN
20.0000 [IU] | Freq: Once | SUBCUTANEOUS | Status: AC
  Administered 2016-12-24: 20 [IU] via SUBCUTANEOUS

## 2016-12-24 MED ORDER — INSULIN ASPART 100 UNIT/ML ~~LOC~~ SOLN
6.0000 [IU] | Freq: Three times a day (TID) | SUBCUTANEOUS | Status: DC
  Administered 2016-12-24 – 2016-12-29 (×14): 6 [IU] via SUBCUTANEOUS

## 2016-12-24 MED ORDER — INSULIN ASPART 100 UNIT/ML ~~LOC~~ SOLN
18.0000 [IU] | Freq: Once | SUBCUTANEOUS | Status: AC
  Administered 2016-12-24: 18 [IU] via SUBCUTANEOUS

## 2016-12-24 MED ORDER — INSULIN GLARGINE 100 UNIT/ML ~~LOC~~ SOLN
45.0000 [IU] | Freq: Every day | SUBCUTANEOUS | Status: DC
  Administered 2016-12-24 – 2017-01-11 (×18): 45 [IU] via SUBCUTANEOUS

## 2016-12-24 NOTE — Progress Notes (Signed)
D:Pt rates depression as a 7 and anxiety as an 8 on 0-10 scale with 10 being the most. He has been isolative to his room. Pt reports that his thoughts "are cloudy." He reported passive si thoughts to Clinical research associate and contracted for safety. Per self inventory, pt denied si thoughts.  A:Offered support, encouragement and 15 minute checks.  R:Safety maintained on the unit.

## 2016-12-24 NOTE — Progress Notes (Signed)
Recreation Therapy Notes  12/24/2016 Approximately 11:50am intern attempted to assess pt. Pt reported that he was not feeling too good, so he asked if intern could come back later to assess him. Intern will attempt to assess pt on 12/25/3016.  Marvell Fuller, Recreation Therapy Intern       Marvell Fuller    [name], LRT/CTRS   12/24/2016 12:01 PM

## 2016-12-24 NOTE — Progress Notes (Signed)
[facility] MD Progress Note  12/24/2016 11:40 AM Tony Cunningham  MRN:  [id number]  Subjective: Renold reports, "I'm not okay, I have real bad depression".  Objective: Tony Cunningham is seen, chart reviewed. He is in his room, lying down in his bed. He presents disheveled. Is making some eye contact & verbally responsive. His affect is flat, depressed. He spends most of the day in his room. Is currently not attending group sessions. Staff reports that he is taking his medications. No adverse effects or side effects reported. No disruptive behavior reported. He is started on Lantus insulin today at 45 units Q hs. His meal time insulin coverage was also adjusted from 4 to 6 units tid per diabetes coordinator. Tony Cunningham at this time is endorsing bad depression, denies any SIHI, however, appears to be responding to some internal stimuli.  Principal Problem: Schizoaffective disorder, Bipolar-type.  Diagnosis:   Patient Active Problem List   Diagnosis Date Noted  . Schizoaffective disorder, bipolar type (HCC) [F25.0] 12/22/2016  . Schizoaffective disorder, depressive type (HCC) [F25.1] 09/23/2016  . Polysubstance abuse [F19.10] 07/17/2016  . Tardive dyskinesia [G24.01] 07/16/2016  . Tobacco use disorder [F17.200] 07/07/2016  . Dyslipidemia [E78.5] 07/07/2016  . Asthma [J45.909] 07/07/2016  . HTN (hypertension) [I10] 07/06/2016  . Diabetes (HCC) [E11.9] 12/25/2010   Total Time spent with patient: 25 minutes  Past Psychiatric History: Schizoaffective disorder, Bipolar-type.  Past Medical History:  Past Medical History:  Diagnosis Date  . Anxiety   . Asthma   . Diabetes mellitus   . High blood pressure   . Sinus complaint    History reviewed. No pertinent surgical history.  Family History: History reviewed. No pertinent family history.  Family Psychiatric  History: See H&P  Social History:  History  Alcohol Use No     History  Drug Use No    Social History   Social History  . Marital status:  Single    Spouse name: [name]  . Number of children: [name]  . Years of education: [name]   Social History Main Topics  . Smoking status: Current Every Day Smoker    Packs/day: 0.50    Types: Cigarettes  . Smokeless tobacco: Never Used  . Alcohol use No  . Drug use: No  . Sexual activity: Not Currently   Other Topics Concern  . None   Social History Narrative  . None   Additional Social History:   Sleep: Fair  Appetite:  Fair  Current Medications: Current Facility-Administered Medications  Medication Dose Route Frequency Provider Last Rate Last Dose  . acetaminophen (TYLENOL) tablet 650 mg  650 mg Oral Q6H PRN Charm Rings, NP      . alum & mag hydroxide-simeth (MAALOX/MYLANTA) 200-200-20 MG/5ML suspension 30 mL  30 mL Oral Q6H PRN Charm Rings, NP      . cloZAPine (CLOZARIL) tablet 100 mg  100 mg Oral QHS Charm Rings, NP   100 mg at 12/23/16 2235  . fluvoxaMINE (LUVOX) tablet 100 mg  100 mg Oral QHS Charm Rings, NP   100 mg at 12/23/16 2235  . ibuprofen (ADVIL,MOTRIN) tablet 600 mg  600 mg Oral Q8H PRN Charm Rings, NP   600 mg at 12/24/16 4098  . insulin aspart (novoLOG) injection 0-15 Units  0-15 Units Subcutaneous TID WC Charm Rings, NP   5 Units at 12/23/16 1716  . insulin aspart (novoLOG) injection 0-5 Units  0-5 Units Subcutaneous QHS Charm Rings, NP   4 Units  at 12/23/16 2237  . insulin aspart (novoLOG) injection 6 Units  6 Units Subcutaneous TID WC Keirsten Matuska I, NP      . insulin glargine (LANTUS) injection 45 Units  45 Units Subcutaneous QHS Joyell Emami I, NP      . lisinopril (PRINIVIL,ZESTRIL) tablet 20 mg  20 mg Oral Daily Charm Rings, NP   20 mg at 12/24/16 0488  . magnesium hydroxide (MILK OF MAGNESIA) suspension 30 mL  30 mL Oral Daily PRN Charm Rings, NP      . metFORMIN (GLUCOPHAGE) tablet 1,000 mg  1,000 mg Oral BID WC Charm Rings, NP   1,000 mg at 12/24/16 0811  . montelukast (SINGULAIR) tablet 10 mg  10 mg Oral QHS Charm Rings, NP   10 mg at 12/23/16 2235  . nicotine polacrilex (NICORETTE) gum 2 mg  2 mg Oral PRN Jomarie Longs, MD      . ondansetron (ZOFRAN) tablet 4 mg  4 mg Oral Q8H PRN Charm Rings, NP      . pantoprazole (PROTONIX) EC tablet 40 mg  40 mg Oral Daily Charm Rings, NP   40 mg at 12/24/16 8916  . polyethylene glycol (MIRALAX / GLYCOLAX) packet 17 g  17 g Oral Daily Charm Rings, NP   17 g at 12/24/16 0818  . propranolol (INDERAL) tablet 20 mg  20 mg Oral BID Charm Rings, NP   20 mg at 12/24/16 9450  . simvastatin (ZOCOR) tablet 20 mg  20 mg Oral q1800 Cobos, Rockey Situ, MD   20 mg at 12/23/16 1718  . traZODone (DESYREL) tablet 50 mg  50 mg Oral QHS PRN Cobos, Rockey Situ, MD   50 mg at 12/23/16 2235    Lab Results:  Results for orders placed or performed during the hospital encounter of 12/22/16 (from the past 48 hour(s))  Glucose, capillary     Status: Abnormal   Collection Time: 12/22/16  9:09 PM  Result Value Ref Range   Glucose-Capillary 361 (H) 65 - 99 mg/dL   Comment 1 Notify RN   Glucose, capillary     Status: Abnormal   Collection Time: 12/23/16  6:07 AM  Result Value Ref Range   Glucose-Capillary 371 (H) 65 - 99 mg/dL   Comment 1 Notify RN   Glucose, capillary     Status: Abnormal   Collection Time: 12/23/16 12:03 PM  Result Value Ref Range   Glucose-Capillary 217 (H) 65 - 99 mg/dL  Glucose, capillary     Status: Abnormal   Collection Time: 12/23/16  5:06 PM  Result Value Ref Range   Glucose-Capillary 217 (H) 65 - 99 mg/dL  Glucose, capillary     Status: Abnormal   Collection Time: 12/23/16  8:40 PM  Result Value Ref Range   Glucose-Capillary 345 (H) 65 - 99 mg/dL  Glucose, capillary     Status: Abnormal   Collection Time: 12/24/16  5:46 AM  Result Value Ref Range   Glucose-Capillary 435 (H) 65 - 99 mg/dL   Blood Alcohol level:  Lab Results  Component Value Date   ETH <5 12/21/2016   ETH <5 10/21/2016   Metabolic Disorder Labs: Lab Results   Component Value Date   HGBA1C 10.3 (H) 11/09/2016   MPG 249 11/09/2016   MPG 275 10/09/2016   Lab Results  Component Value Date   PROLACTIN 24.5 (H) 09/24/2016   PROLACTIN 3.4 (L) 07/07/2016   Lab Results  Component  Value Date   CHOL 92 11/09/2016   TRIG 54 11/09/2016   HDL 39 (L) 11/09/2016   CHOLHDL 2.4 11/09/2016   VLDL 11 11/09/2016   LDLCALC 42 11/09/2016   LDLCALC 72 10/09/2016   Physical Findings: AIMS: Facial and Oral Movements Muscles of Facial Expression: Mild Lips and Perioral Area: Mild Jaw: Minimal Tongue: None, normal,Extremity Movements Upper (arms, wrists, hands, fingers): Mild Lower (legs, knees, ankles, toes): Mild, Trunk Movements Neck, shoulders, hips: None, normal, Overall Severity Severity of abnormal movements (highest score from questions above): Mild Incapacitation due to abnormal movements: Minimal Patient's awareness of abnormal movements (rate only patient's report): No Awareness, Dental Status Current problems with teeth and/or dentures?: No Does patient usually wear dentures?: No  CIWA:    COWS:     Musculoskeletal: Strength & Muscle Tone: within normal limits Gait & Station: normal Patient leans: [name]  Psychiatric Specialty Exam: Physical Exam  Review of Systems  Psychiatric/Behavioral: Positive for depression, hallucinations and substance abuse (Benzodiazepine use). Negative for memory loss and suicidal ideas. The patient is nervous/anxious and has insomnia.     Blood pressure (!) 141/77, pulse 84, temperature 98.5 F (36.9 C), resp. rate 16, height 6' (1.829 m), weight 81.4 kg (179 lb 8 oz), SpO2 100 %.Body mass index is 24.34 kg/m.  General Appearance: Fairly Groomed  Eye Contact:  Fair  Speech:  Slow- poor speech, communicates in monosyllables or short phrases   Volume:  Decreased  Mood:  depressed   Affect:  blunted , vaguely irritable  Thought Process:  Linear, slowed.   Orientation:  Other:  he is alert and attentive, he  is partially oriented, knows he is at [facility], knows day, but not year. Poor effort may be affecting presentation.   Thought Content:  denies hallucinations, and does not appear internally preoccupied at present  Suicidal Thoughts:  contracts for safety, denies current suicidal plan or intention at this time  Homicidal Thoughts:  No denies homicidal or violent ideations  Memory:  recall 3/3 immediate and 3/3 at 3 minutes   Judgement:  Fair  Insight:  Fair  Psychomotor Activity:  Decreased- in bed   Concentration:  Concentration: Fair and Attention Span: Fair  Recall:  [employer] of Knowledge:  Fair  Language:  Fair  Akathisia:  Negative  Handed:  Right  AIMS (if indicated):     Assets:  Desire for Improvement Resilience  ADL's:  Impaired  Cognition:  Impaired,  Mild  Sleep: 4.75      Treatment Plan Summary: Patient is not at his baseline, yet. There remains evidence of psychosis. No evidence of mania. No dangerousness to self or others. We are continuing while the Social worker continue finalizing his aftercare    Psychiatric: Schizoaffective disorder, Bipolar-type.  Medical:  Diabetes Mellitus: Initiated Lantus 45 units Q hs. Increased meal time insulin coverage from 4 units to 6 units tid. Continue Metformin1,000 mg bid. HTN: Continue Lisinopril 20 mg daily. High Cholesterol:  Continue Zocor 20 mg Q evenings. Will continue to monitor for any symptoms & treat on a prn basis.  Psychosocial:  Disabled  Will encourage group counseling attendance & participation.  PLAN: 1.12-24-16: No changes made on the current plan of care, continue current regimen as recommended.  Anxiety/HTN: Continue Propranolol 20 mg daily.  Mood Control/Stabilization:  Continue Clozapine (Clozaril) 100 mg Q hs.  Depression: Continue Fluvoxamine (Luvox) 100 mg Q hs.  Insomnia: Continue 50 mg prn at bedtime.  Nicotine Withdrawal symptoms: Will continue  the nicotine gum.  2. Continue to  monitor mood, behavior and interaction with peers   Sanjuana Kava, NP, PMHNP, FNP-BC  Sanjuana Kava, NP, PMHNP, FNP-BC 12/24/2016, 11:40 AM

## 2016-12-24 NOTE — Progress Notes (Addendum)
Inpatient Diabetes Program Recommendations  AACE/ADA: New Consensus Statement on Inpatient Glycemic Control (2015)  Target Ranges:  Prepandial:   less than 140 mg/dL      Peak postprandial:   less than 180 mg/dL (1-2 hours)      Critically ill patients:  140 - 180 mg/dL   Results for PERLIE, SCHEURING (MRN [id number]) as of 12/24/2016 09:09  Ref. Range 12/23/2016 06:07 12/23/2016 12:03 12/23/2016 17:06 12/23/2016 20:40  Glucose-Capillary Latest Ref Range: 65 - 99 mg/dL 517 (H) 001 (H) 749 (H) 345 (H)   Results for NORVELL, CASWELL (MRN [id number]) as of 12/24/2016 09:09  Ref. Range 12/24/2016 05:46  Glucose-Capillary Latest Ref Range: 65 - 99 mg/dL 384 (H)    Review of Glycemic Control  Diabetes history:DM 2  Outpatient Diabetes medications: Lantus 55 units QHS                                                        Novolog 10 units TID with meals                                                        Metformin 1000 mg BID  Current Insulin Orders: Novolog Moderate Correction Scale/ SSI (0-15 units) TID AC + HS                                       Novolog 4 units TID                                       Metformin 1000 mg BID     MD- Please consider the following in-hospital insulin adjustments:  1. Start Lantus 45 units daily- Start today (80% home dose)  2. Increase Novolog Meal Coverage to 6 units TID with meals (hold if pt eats <50% of meal)     --Will follow patient during hospitalization--  Ambrose Finland RN, MSN, CDE Diabetes Coordinator Inpatient Glycemic Control Team Team Pager: [phone] (8a-5p)

## 2016-12-24 NOTE — Plan of Care (Signed)
Problem: Activity: Goal: Interest or engagement in activities will improve Outcome: Not Progressing Pt has been isolative to his room and not attending group.

## 2016-12-24 NOTE — [facility] Group Notes (Signed)
[facility] [employer] Group Therapy  12/24/2016 , 1:35 PM    Type of Therapy:  [employer] Presentation  Participation Level:  Active  Participation Quality:  Attentive  Affect:  Blunted  Cognitive:  Oriented  Insight:  Limited  Engagement in Therapy:  Engaged  Modes of Intervention:  Discussion, Education and Socialization  Summary of Progress/Problems:  Onalee Hua from [employer] came to present his recovery story and play the guitar.  Invited.  In bed. Awake. Engageable. Declined to attend.  [name] 12/24/2016 , 1:35 PM

## 2016-12-24 NOTE — Progress Notes (Signed)
  D: Pt was soft spoken and quiet. When answering questions the writer had to look and the pt's hand gestures to understand what was being said. Pt denied si and Hi, but did inform the writer that he is experiencing a "little bit" of hallucinations.  Pt has no questions or concerns.    A:  Support and encouragement was offered. 15 min checks continued for safety.  R: Pt remains safe.

## 2016-12-24 NOTE — Progress Notes (Signed)
Called NP to report CBG. Per NP recheck in 1 hr as CBG is starting to decrease. Pt is asymptomatic at this time. Will report to next shift RN. Safety maintained.

## 2016-12-24 NOTE — Progress Notes (Signed)
Recreation Therapy Notes  Date: 12/24/2016 Time: 10:00am Location: [address] Dayroom  Group Topic: Leisure Education  Goal Area(s) Addresses:  Pt will be able to identify leisure activities. Pt will be able to successfully identify the benefits of participating in leisure activities.  Intervention: Art  Activity: Pts will work in teams and make a leisure public service announcement using [employer], tape and markers to identify the things they enjoy doing for leisure. Pts will identify what the benefits are from participating in leisure activities.  Education: Leisure Education, Building control surveyor  Education Outcome: Needs additional education  Clinical Observations/Feedback: Pt did not attend group.  Marvell Fuller, Recreation Therapy Intern   [name], LRT/CTRS

## 2016-12-25 LAB — GLUCOSE, CAPILLARY
GLUCOSE-CAPILLARY: 190 mg/dL — AB (ref 65–99)
GLUCOSE-CAPILLARY: 414 mg/dL — AB (ref 65–99)
GLUCOSE-CAPILLARY: 373 mg/dL — AB (ref 65–99)
GLUCOSE-CAPILLARY: 256 mg/dL — AB (ref 65–99)
Glucose-Capillary: 224 mg/dL — ABNORMAL HIGH (ref 65–99)

## 2016-12-25 MED ORDER — INSULIN ASPART 100 UNIT/ML ~~LOC~~ SOLN
20.0000 [IU] | Freq: Once | SUBCUTANEOUS | Status: AC
  Administered 2016-12-25: 20 [IU] via SUBCUTANEOUS

## 2016-12-25 MED ORDER — CLOZAPINE 25 MG PO TABS
150.0000 mg | ORAL_TABLET | Freq: Every day | ORAL | Status: DC
  Administered 2016-12-25 – 2017-01-14 (×21): 150 mg via ORAL
  Filled 2016-12-25 (×22): qty 2

## 2016-12-25 MED ORDER — TUBERCULIN PPD 5 UNIT/0.1ML ID SOLN
5.0000 [IU] | Freq: Once | INTRADERMAL | Status: AC
  Administered 2016-12-25: 5 [IU] via INTRADERMAL

## 2016-12-25 NOTE — Progress Notes (Signed)
[facility] MD Progress Note  12/25/2016 11:53 AM Tony Cunningham  MRN:  [id number]  Subjective: Tony Cunningham reports, "Cunningham'm feeling so, so. Cunningham don't feel clear in my head. Cunningham don't know what is going on. Cunningham feel stressed even though Cunningham'm relaxing"  Objective: Tony Cunningham is seen, chart reviewed. He is in his room, sitting on his bed. He is visible on the unit this morning, attended the morning group sessions. He presents disheveled. Is not making any eye contact today. He is verbally responsive. His affect is flat, at times, blunted, depressed. He spends most of the day in his room, most times. He says today that he does not feel clear in his head. Complains of feeling stressed even though he is trying to relax. Staff reports that he is taking his medications. No adverse effects or side effects reported. No disruptive behavior reported. He was started on Lantus insulin today at 45 units Q hs yesterday. His meal time insulin coverage was also adjusted from 4 to 6 units tid per diabetes coordinator. Tony Cunningham at this time is endorsing bad depression, denies any SIHI, however, appears to be responding to some internal stimuli. His Clozaril dose has been increased from 100 mg to 150 mg Q hs.  Principal Problem: Schizoaffective disorder, Bipolar-type.  Diagnosis:   Patient Active Problem List   Diagnosis Date Noted  . Schizoaffective disorder, bipolar type (HCC) [F25.0] 12/22/2016  . Schizoaffective disorder, depressive type (HCC) [F25.1] 09/23/2016  . Polysubstance abuse [F19.10] 07/17/2016  . Tardive dyskinesia [G24.01] 07/16/2016  . Tobacco use disorder [F17.200] 07/07/2016  . Dyslipidemia [E78.5] 07/07/2016  . Asthma [J45.909] 07/07/2016  . HTN (hypertension) [I10] 07/06/2016  . Diabetes (HCC) [E11.9] 12/25/2010   Total Time spent with patient: 15 minutes  Past Psychiatric History: Schizoaffective disorder, Bipolar-type.  Past Medical History:  Past Medical History:  Diagnosis Date  . Anxiety   . Asthma   . Diabetes  mellitus   . High blood pressure   . Sinus complaint    History reviewed. No pertinent surgical history.  Family History: History reviewed. No pertinent family history.  Family Psychiatric  History: See H&P  Social History:  History  Alcohol Use No     History  Drug Use No    Social History   Social History  . Marital status: Single    Spouse name: [name]  . Number of children: [name]  . Years of education: [name]   Social History Main Topics  . Smoking status: Current Every Day Smoker    Packs/day: 0.50    Types: Cigarettes  . Smokeless tobacco: Never Used  . Alcohol use No  . Drug use: No  . Sexual activity: Not Currently   Other Topics Concern  . None   Social History Narrative  . None   Additional Social History:   Sleep: Fair  Appetite:  Fair  Current Medications: Current Facility-Administered Medications  Medication Dose Route Frequency Provider Last Rate Last Dose  . acetaminophen (TYLENOL) tablet 650 mg  650 mg Oral Q6H PRN Charm Rings, NP      . alum & mag hydroxide-simeth (MAALOX/MYLANTA) 200-200-20 MG/5ML suspension 30 mL  30 mL Oral Q6H PRN Charm Rings, NP      . cloZAPine (CLOZARIL) tablet 150 mg  150 mg Oral QHS Oneta Rack, NP      . fluvoxaMINE (LUVOX) tablet 100 mg  100 mg Oral QHS Charm Rings, NP   100 mg at 12/24/16 2132  . ibuprofen (ADVIL,MOTRIN)  tablet 600 mg  600 mg Oral Q8H PRN Charm Rings, NP   600 mg at 12/24/16 1914  . insulin aspart (novoLOG) injection 0-15 Units  0-15 Units Subcutaneous TID WC Charm Rings, NP   5 Units at 12/25/16 [phone]  . insulin aspart (novoLOG) injection 0-5 Units  0-5 Units Subcutaneous QHS Charm Rings, NP   5 Units at 12/24/16 2136  . insulin aspart (novoLOG) injection 6 Units  6 Units Subcutaneous TID WC Tony Stammer I, NP   6 Units at 12/25/16 [phone]  . insulin glargine (LANTUS) injection 45 Units  45 Units Subcutaneous QHS Tony Stammer I, NP   45 Units at 12/24/16 2135  . lisinopril  (PRINIVIL,ZESTRIL) tablet 20 mg  20 mg Oral Daily Charm Rings, NP   20 mg at 12/25/16 0800  . magnesium hydroxide (MILK OF MAGNESIA) suspension 30 mL  30 mL Oral Daily PRN Charm Rings, NP      . metFORMIN (GLUCOPHAGE) tablet 1,000 mg  1,000 mg Oral BID WC Charm Rings, NP   1,000 mg at 12/25/16 0759  . montelukast (SINGULAIR) tablet 10 mg  10 mg Oral QHS Charm Rings, NP   10 mg at 12/24/16 2132  . nicotine polacrilex (NICORETTE) gum 2 mg  2 mg Oral PRN Eappen, Levin Bacon, MD      . ondansetron (ZOFRAN) tablet 4 mg  4 mg Oral Q8H PRN Charm Rings, NP      . pantoprazole (PROTONIX) EC tablet 40 mg  40 mg Oral Daily Charm Rings, NP   40 mg at 12/25/16 0759  . polyethylene glycol (MIRALAX / GLYCOLAX) packet 17 g  17 g Oral Daily Charm Rings, NP   17 g at 12/24/16 0818  . propranolol (INDERAL) tablet 20 mg  20 mg Oral BID Charm Rings, NP   20 mg at 12/25/16 0800  . simvastatin (ZOCOR) tablet 20 mg  20 mg Oral q1800 Cobos, Rockey Situ, MD   20 mg at 12/24/16 1830  . traZODone (DESYREL) tablet 50 mg  50 mg Oral QHS PRN Cobos, Rockey Situ, MD   50 mg at 12/23/16 2235  . tuberculin injection 5 Units  5 Units Intradermal Once Nelly Rout, MD        Lab Results:  Results for orders placed or performed during the hospital encounter of 12/22/16 (from the past 48 hour(s))  Glucose, capillary     Status: Abnormal   Collection Time: 12/23/16 12:03 PM  Result Value Ref Range   Glucose-Capillary 217 (H) 65 - 99 mg/dL  Glucose, capillary     Status: Abnormal   Collection Time: 12/23/16  5:06 PM  Result Value Ref Range   Glucose-Capillary 217 (H) 65 - 99 mg/dL  Glucose, capillary     Status: Abnormal   Collection Time: 12/23/16  8:40 PM  Result Value Ref Range   Glucose-Capillary 345 (H) 65 - 99 mg/dL  Glucose, capillary     Status: Abnormal   Collection Time: 12/24/16  5:46 AM  Result Value Ref Range   Glucose-Capillary 435 (H) 65 - 99 mg/dL  Glucose, capillary     Status:  Abnormal   Collection Time: 12/24/16 12:11 PM  Result Value Ref Range   Glucose-Capillary 310 (H) 65 - 99 mg/dL  Glucose, capillary     Status: Abnormal   Collection Time: 12/24/16  5:03 PM  Result Value Ref Range   Glucose-Capillary 448 (H) 65 - 99 mg/dL  Glucose, capillary     Status: Abnormal   Collection Time: 12/24/16  5:18 PM  Result Value Ref Range   Glucose-Capillary 466 (H) 65 - 99 mg/dL  Glucose, capillary     Status: Abnormal   Collection Time: 12/24/16  6:37 PM  Result Value Ref Range   Glucose-Capillary 423 (H) 65 - 99 mg/dL  Glucose, capillary     Status: Abnormal   Collection Time: 12/24/16  7:42 PM  Result Value Ref Range   Glucose-Capillary 352 (H) 65 - 99 mg/dL  Glucose, capillary     Status: Abnormal   Collection Time: 12/25/16  5:29 AM  Result Value Ref Range   Glucose-Capillary 224 (H) 65 - 99 mg/dL   Blood Alcohol level:  Lab Results  Component Value Date   ETH <5 12/21/2016   ETH <5 10/21/2016   Metabolic Disorder Labs: Lab Results  Component Value Date   HGBA1C 10.3 (H) 11/09/2016   MPG 249 11/09/2016   MPG 275 10/09/2016   Lab Results  Component Value Date   PROLACTIN 24.5 (H) 09/24/2016   PROLACTIN 3.4 (L) 07/07/2016   Lab Results  Component Value Date   CHOL 92 11/09/2016   TRIG 54 11/09/2016   HDL 39 (L) 11/09/2016   CHOLHDL 2.4 11/09/2016   VLDL 11 11/09/2016   LDLCALC 42 11/09/2016   LDLCALC 72 10/09/2016   Physical Findings: AIMS: Facial and Oral Movements Muscles of Facial Expression: Mild Lips and Perioral Area: Mild Jaw: Minimal Tongue: None, normal,Extremity Movements Upper (arms, wrists, hands, fingers): Mild Lower (legs, knees, ankles, toes): Mild, Trunk Movements Neck, shoulders, hips: None, normal, Overall Severity Severity of abnormal movements (highest score from questions above): Mild Incapacitation due to abnormal movements: Minimal Patient's awareness of abnormal movements (rate only patient's report): No  Awareness, Dental Status Current problems with teeth and/or dentures?: No Does patient usually wear dentures?: No  CIWA:    COWS:     Musculoskeletal: Strength & Muscle Tone: within normal limits Gait & Station: normal Patient leans: [name]  Psychiatric Specialty Exam: Physical Exam  Review of Systems  Psychiatric/Behavioral: Positive for depression, hallucinations and substance abuse (Benzodiazepine use). Negative for memory loss and suicidal ideas. The patient is nervous/anxious and has insomnia.     Blood pressure 125/80, pulse 82, temperature 98.4 F (36.9 C), temperature source Oral, resp. rate 16, height 6' (1.829 m), weight 81.4 kg (179 lb 8 oz), SpO2 100 %.Body mass index is 24.34 kg/m.  General Appearance: Fairly Groomed  Eye Contact:  Fair  Speech:  Slow- poor speech, communicates in monosyllables or short phrases   Volume:  Decreased  Mood:  depressed   Affect:  blunted , vaguely irritable  Thought Process:  Linear, slowed.   Orientation:  Other:  he is alert and attentive, he is partially oriented, knows he is at [facility], knows day, but not year. Poor effort may be affecting presentation.   Thought Content:  denies hallucinations, and does not appear internally preoccupied at present  Suicidal Thoughts:  contracts for safety, denies current suicidal plan or intention at this time  Homicidal Thoughts:  No denies homicidal or violent ideations  Memory:  recall 3/3 immediate and 3/3 at 3 minutes   Judgement:  Fair  Insight:  Fair  Psychomotor Activity:  Decreased- in bed   Concentration:  Concentration: Fair and Attention Span: Fair  Recall:  [employer] of Knowledge:  Fair  Language:  Fair  Akathisia:  Negative  Handed:  Right  AIMS (if indicated):     Assets:  Desire for Improvement Resilience  ADL's:  Impaired  Cognition:  Impaired,  Mild  Sleep: 4.75      Treatment Plan Summary: Patient is not at his baseline, yet. There remains evidence of psychosis. No evidence  of mania. No dangerousness to self or others. We are continuing while the Social worker continue finalizing his aftercare    Psychiatric: Schizoaffective disorder, Bipolar-type.  Medical:  Diabetes Mellitus: Initiated Lantus 45 units Q hs. Increased meal time insulin coverage from 4 units to 6 units tid. Continue Metformin1,000 mg bid. HTN: Continue Lisinopril 20 mg daily. High Cholesterol:  Continue Zocor 20 mg Q evenings. Will continue to monitor for any symptoms & treat on a prn basis.  Psychosocial:  Disabled  Will encourage group counseling attendance & participation.  PLAN: 1.12-24-16: See new changes made on the current plan of care, continue current regimen as recommended.  Anxiety/HTN: Continue Propranolol 20 mg daily.  Mood Control/Stabilization:  Increased Clozapine (Clozaril) from100 mg to 150 mg Q hs.  Depression: Continue Fluvoxamine (Luvox) 100 mg Q hs.  Insomnia: Continue 50 mg prn at bedtime.  Nicotine Withdrawal symptoms: Will continue the nicotine gum.  2. Continue to monitor mood, behavior and interaction with peers  Sanjuana Kava, NP, PMHNP, FNP-BC 12/25/2016, 11:53 AMPatient ID: [name], male   DOB: October 25, 1975, 41 y.o.   MRN: [id number]

## 2016-12-25 NOTE — Progress Notes (Addendum)
DAR Note: Pt at the time of assessment was flat, isolative and withdrawn to room. Pt denied any depression, anxiety, pain, SI, HI or AVH; states, "I feel better now than when I came in." Pt could be slow to respond to questions. Pt remained calm and cooperative. Medications offered as prescribed. All patient's questions and concerns addressed. Support, encouragement, and safe environment provided. 15-minute safety checks continue. Pt was med compliant. Safety checks continue.

## 2016-12-25 NOTE — Progress Notes (Signed)
Pt's cbg was 414. Contacted NP and received orders to give 20 units along with 6 units for meal coverage. Per NP recheck cbg in two hours. Safety maintained.

## 2016-12-25 NOTE — Progress Notes (Signed)
Patient ID: Tony Cunningham, male   DOB: [birth year], 41 y.o.   MRN: [id number] PER STATE REGULATIONS 482.30  THIS CHART WAS REVIEWED FOR MEDICAL NECESSITY WITH RESPECT TO THE PATIENT'S ADMISSION/ DURATION OF STAY.  NEXT REVIEW DATE: 12/30/16  Willa Rough, RN, BSN CASE MANAGER

## 2016-12-25 NOTE — Progress Notes (Addendum)
D:Pt has been isolative in his room. He refused to go to lunch and then changed his mind. He is slow to respond at times and does not initiate conversation. A:Offered support, encouragement, redirection and 15 minute checks. Completed TB test at 1157 in lt forearm. R:Safety maintained on the unit.

## 2016-12-25 NOTE — [facility] Group Notes (Signed)
[facility] LCSW Group Therapy  12/25/2016  1:05 PM  Type of Therapy:  Group therapy  Participation Level:  Active  Participation Quality:  Attentive  Affect:  Flat  Cognitive:  Oriented  Insight:  Limited  Engagement in Therapy:  Limited  Modes of Intervention:  Discussion, Socialization  Summary of Progress/Problems:  Chaplain was here to lead a group on themes of hope and courage.  As promised, came to group.  Chose to not participate, but stayed the entire time.  [name] 12/25/2016 1:56 PM

## 2016-12-25 NOTE — Progress Notes (Signed)
Pt did not attend wrap up group meeting this evening.  

## 2016-12-25 NOTE — Progress Notes (Signed)
Recreation Therapy Notes  Date: 12/25/2016 Time: 10:00am Location: [address] Dayroom  Group Topic: Teamwork and Communication  Goal Area(s) Addresses:  Pt will successfully communicate with team members in order to reach a shared goal.  Behavioral Response: Disengaged  Intervention: Straws, Masking Tape, Golf Ball  Activity: Pts were split into three teams and each team was given 12 straws and 18 inches of masking tape. Pts were instructed to create a free standing landing pad for a golf ball.  Education: Film/video editor, Discharge Planning  Education Outcome: Acknowledges understanding  Clinical Observations/Feedback: Approximately 10:15am pt joined group. Pt did not participate in group, but respectfully listened to peers during processing discussion.  Marvell Fuller, Recreation Therapy Intern   [name], LRT/CTRS

## 2016-12-25 NOTE — Progress Notes (Signed)
Adult Psychoeducational Group Note  Date:  12/25/2016 Time:  3:02 AM  Group Topic/Focus:  Wrap-Up Group:   The focus of this group is to help patients review their daily goal of treatment and discuss progress on daily workbooks.  Participation Level:  Active  Participation Quality:  Appropriate  Affect:  Appropriate  Cognitive:  Appropriate  Insight: Appropriate  Engagement in Group:  Limited  Modes of Intervention:  Discussion  Additional Comments:  Pt stated his goal for today was to interact more with his peers. Pt stated he achieved his goal today. Pt rated his overall day a 6 out of 10. Pt stated his goal for tomorrow was to talk with doctors about the pain in his body.  [name] 12/25/2016, 3:02 AM

## 2016-12-25 NOTE — Progress Notes (Signed)
Inpatient Diabetes Program Recommendations  AACE/ADA: New Consensus Statement on Inpatient Glycemic Control (2015)  Target Ranges:  Prepandial:   less than 140 mg/dL      Peak postprandial:   less than 180 mg/dL (1-2 hours)      Critically ill patients:  140 - 180 mg/dL   Results for Tony Cunningham, Tony Cunningham (MRN [id number]) as of 12/25/2016 08:45  Ref. Range 12/24/2016 05:46 12/24/2016 12:11 12/24/2016 17:03 12/24/2016 17:18 12/24/2016 18:37 12/24/2016 19:42  Glucose-Capillary Latest Ref Range: 65 - 99 mg/dL 122 (H) 482 (H) 500 (H) 466 (H) 423 (H) 352 (H)   Results for Tony Cunningham, Tony Cunningham (MRN [id number]) as of 12/25/2016 08:45  Ref. Range 12/25/2016 05:29  Glucose-Capillary Latest Ref Range: 65 - 99 mg/dL 694 (H)    Outpatient Diabetes medications: Lantus 55 units QHS Novolog 10 units TID with meals Metformin 1000 mg BID  Current Insulin Orders: Lantus 45 units QHS      Novolog Moderate Correction Scale/ SSI (0-15 units) TID AC + HS Novolog 6 units TID Metformin 1000 mg BID     MD- Please consider the following in-hospital insulin adjustments:  1. Increase Lantus to 55 units QHS (100% home dose)  2. Increase Novolog Meal Coverage to 8 units TID with meals (hold if pt eats <50% of meal)      --Will follow patient during hospitalization--  Ambrose Finland RN, MSN, CDE Diabetes Coordinator Inpatient Glycemic Control Team Team Pager: [phone] (8a-5p)

## 2016-12-26 DIAGNOSIS — F39 Unspecified mood [affective] disorder: Secondary | ICD-10-CM

## 2016-12-26 DIAGNOSIS — R443 Hallucinations, unspecified: Secondary | ICD-10-CM

## 2016-12-26 LAB — GLUCOSE, CAPILLARY
GLUCOSE-CAPILLARY: 174 mg/dL — AB (ref 65–99)
Glucose-Capillary: 212 mg/dL — ABNORMAL HIGH (ref 65–99)
Glucose-Capillary: 242 mg/dL — ABNORMAL HIGH (ref 65–99)
Glucose-Capillary: 305 mg/dL — ABNORMAL HIGH (ref 65–99)

## 2016-12-26 NOTE — Progress Notes (Signed)
D: Pt denies SI/HI/AVH. Pt stated he felt like he was in a fog. Pt stated he did not like the ECT and did not think he wanted to do it again  A: Pt was offered support and encouragement. Pt was given scheduled medications. Pt was encourage to attend groups. Q 15 minute checks were done for safety.   R: safety maintained on unit.

## 2016-12-26 NOTE — Progress Notes (Signed)
Patient ID: Tony Cunningham, male   DOB: [birth year], 42 y.o.   MRN: [id number]    D: Pt has been very flat and depressed on the unit today. Pt was also very isolative, he was in the bed all day, pt only got up for meals. Pt refused to fill out his patient self inventory sheet, he reported that he was not in the mood. Pt did not attend any groups nor did he engage in any treatment. Pt reported being negative SI/HI, no AH/VH noted. A: 15 min checks continued for patient safety. R: Pt safety maintained.

## 2016-12-26 NOTE — Progress Notes (Signed)
Pt rated his day a 7 out of 10. Pt goal for tomorrow is to get up more and participate.

## 2016-12-26 NOTE — Progress Notes (Signed)
D: Pt denies SI/HI/AVH. Pt is pleasant and cooperative. Pt goal is to participate more in group.  A: Pt was offered support and encouragement. Pt was given scheduled medications. Pt was encourage to attend groups. Q 15 minute checks were done for safety.  R:Pt attends groups and interacts well with peers and staff. Pt is taking medication. Pt has no complaints.Pt receptive to treatment and safety maintained on unit.

## 2016-12-26 NOTE — Plan of Care (Signed)
Problem: Coping: Goal: Ability to cope will improve Outcome: Progressing Pt spent some time in the dayroom with out peers this evening

## 2016-12-26 NOTE — [facility] Group Notes (Signed)
[facility] Group Notes: (Clinical Social Work)   12/26/2016      Type of Therapy:  Group Therapy   Participation Level:  Did Not Attend despite MHT prompting   Lavonya Hoerner Grossman-Orr, LCSW 12/26/2016, 12:25 PM     

## 2016-12-26 NOTE — Progress Notes (Signed)
[facility] MD Progress Note  12/26/2016 11:01 AM Tony Cunningham  MRN:  [id number] Subjective:  Tony Cunningham reports " I am just tired today."  Objective: Tony Cunningham is awake, alert and oriented. Seen resting in bedroom. Patient reports irritability and tiredness today. Patient answers question with short "yes and no" responses and head nodding. denies depression and aggress that he is taken is medication as prescribed.   Denies suicidal or homicidal ideation during this assessment.  During this discussion patient placed blanket above his head and ask writer to come back at a later time. States " I am tired"  Support, encouragement and reassurance was provided.   Principal Problem: Schizoaffective disorder, bipolar type (HCC) Diagnosis:   Patient Active Problem List   Diagnosis Date Noted  . Schizoaffective disorder, bipolar type (HCC) [F25.0] 12/22/2016  . Schizoaffective disorder, depressive type (HCC) [F25.1] 09/23/2016  . Polysubstance abuse [F19.10] 07/17/2016  . Tardive dyskinesia [G24.01] 07/16/2016  . Tobacco use disorder [F17.200] 07/07/2016  . Dyslipidemia [E78.5] 07/07/2016  . Asthma [J45.909] 07/07/2016  . HTN (hypertension) [I10] 07/06/2016  . Diabetes (HCC) [E11.9] 12/25/2010   Total Time spent with patient: 30 minutes  Past Psychiatric History:   Past Medical History:  Past Medical History:  Diagnosis Date  . Anxiety   . Asthma   . Diabetes mellitus   . High blood pressure   . Sinus complaint    History reviewed. No pertinent surgical history. Family History: History reviewed. No pertinent family history. Family Psychiatric  History: Social History:  History  Alcohol Use No     History  Drug Use No    Social History   Social History  . Marital status: Single    Spouse name: [name]  . Number of children: [name]  . Years of education: [name]   Social History Main Topics  . Smoking status: Current Every Day Smoker    Packs/day: 0.50    Types: Cigarettes  . Smokeless tobacco:  Never Used  . Alcohol use No  . Drug use: No  . Sexual activity: Not Currently   Other Topics Concern  . None   Social History Narrative  . None   Additional Social History:                         Sleep: Fair  Appetite:  Fair  Current Medications: Current Facility-Administered Medications  Medication Dose Route Frequency Provider Last Rate Last Dose  . acetaminophen (TYLENOL) tablet 650 mg  650 mg Oral Q6H PRN Charm Rings, NP      . alum & mag hydroxide-simeth (MAALOX/MYLANTA) 200-200-20 MG/5ML suspension 30 mL  30 mL Oral Q6H PRN Charm Rings, NP      . cloZAPine (CLOZARIL) tablet 150 mg  150 mg Oral QHS Oneta Rack, NP   150 mg at 12/25/16 2143  . fluvoxaMINE (LUVOX) tablet 100 mg  100 mg Oral QHS Charm Rings, NP   100 mg at 12/25/16 2143  . ibuprofen (ADVIL,MOTRIN) tablet 600 mg  600 mg Oral Q8H PRN Charm Rings, NP   600 mg at 12/24/16 0350  . insulin aspart (novoLOG) injection 0-15 Units  0-15 Units Subcutaneous TID WC Charm Rings, NP   3 Units at 12/26/16 [phone]  . insulin aspart (novoLOG) injection 0-5 Units  0-5 Units Subcutaneous QHS Charm Rings, NP   3 Units at 12/25/16 2147  . insulin aspart (novoLOG) injection 6 Units  6 Units Subcutaneous TID  WC Armandina Stammer I, NP   6 Units at 12/26/16 [phone]  . insulin glargine (LANTUS) injection 45 Units  45 Units Subcutaneous QHS Armandina Stammer I, NP   45 Units at 12/25/16 2146  . lisinopril (PRINIVIL,ZESTRIL) tablet 20 mg  20 mg Oral Daily Charm Rings, NP   20 mg at 12/26/16 0844  . magnesium hydroxide (MILK OF MAGNESIA) suspension 30 mL  30 mL Oral Daily PRN Charm Rings, NP      . metFORMIN (GLUCOPHAGE) tablet 1,000 mg  1,000 mg Oral BID WC Charm Rings, NP   1,000 mg at 12/26/16 0844  . montelukast (SINGULAIR) tablet 10 mg  10 mg Oral QHS Charm Rings, NP   10 mg at 12/25/16 2143  . nicotine polacrilex (NICORETTE) gum 2 mg  2 mg Oral PRN Eappen, Levin Bacon, MD      . ondansetron (ZOFRAN)  tablet 4 mg  4 mg Oral Q8H PRN Charm Rings, NP      . pantoprazole (PROTONIX) EC tablet 40 mg  40 mg Oral Daily Charm Rings, NP   40 mg at 12/26/16 0844  . polyethylene glycol (MIRALAX / GLYCOLAX) packet 17 g  17 g Oral Daily Charm Rings, NP   17 g at 12/24/16 0818  . propranolol (INDERAL) tablet 20 mg  20 mg Oral BID Charm Rings, NP   20 mg at 12/26/16 0844  . simvastatin (ZOCOR) tablet 20 mg  20 mg Oral q1800 Cobos, Rockey Situ, MD   20 mg at 12/25/16 1731  . traZODone (DESYREL) tablet 50 mg  50 mg Oral QHS PRN Cobos, Rockey Situ, MD   50 mg at 12/23/16 2235  . tuberculin injection 5 Units  5 Units Intradermal Once Nelly Rout, MD   5 Units at 12/25/16 1157    Lab Results:  Results for orders placed or performed during the hospital encounter of 12/22/16 (from the past 48 hour(s))  Glucose, capillary     Status: Abnormal   Collection Time: 12/24/16 12:11 PM  Result Value Ref Range   Glucose-Capillary 310 (H) 65 - 99 mg/dL  Glucose, capillary     Status: Abnormal   Collection Time: 12/24/16  5:03 PM  Result Value Ref Range   Glucose-Capillary 448 (H) 65 - 99 mg/dL  Glucose, capillary     Status: Abnormal   Collection Time: 12/24/16  5:18 PM  Result Value Ref Range   Glucose-Capillary 466 (H) 65 - 99 mg/dL  Glucose, capillary     Status: Abnormal   Collection Time: 12/24/16  6:37 PM  Result Value Ref Range   Glucose-Capillary 423 (H) 65 - 99 mg/dL  Glucose, capillary     Status: Abnormal   Collection Time: 12/24/16  7:42 PM  Result Value Ref Range   Glucose-Capillary 352 (H) 65 - 99 mg/dL  Glucose, capillary     Status: Abnormal   Collection Time: 12/25/16  5:29 AM  Result Value Ref Range   Glucose-Capillary 224 (H) 65 - 99 mg/dL  Glucose, capillary     Status: Abnormal   Collection Time: 12/25/16 12:05 PM  Result Value Ref Range   Glucose-Capillary 190 (H) 65 - 99 mg/dL  Glucose, capillary     Status: Abnormal   Collection Time: 12/25/16  5:16 PM  Result  Value Ref Range   Glucose-Capillary 414 (H) 65 - 99 mg/dL  Glucose, capillary     Status: Abnormal   Collection Time: 12/25/16  7:21 PM  Result Value Ref Range   Glucose-Capillary 373 (H) 65 - 99 mg/dL  Glucose, capillary     Status: Abnormal   Collection Time: 12/25/16  8:31 PM  Result Value Ref Range   Glucose-Capillary 256 (H) 65 - 99 mg/dL  Glucose, capillary     Status: Abnormal   Collection Time: 12/26/16  6:14 AM  Result Value Ref Range   Glucose-Capillary 174 (H) 65 - 99 mg/dL    Blood Alcohol level:  Lab Results  Component Value Date   ETH <5 12/21/2016   ETH <5 10/21/2016    Metabolic Disorder Labs: Lab Results  Component Value Date   HGBA1C 10.3 (H) 11/09/2016   MPG 249 11/09/2016   MPG 275 10/09/2016   Lab Results  Component Value Date   PROLACTIN 24.5 (H) 09/24/2016   PROLACTIN 3.4 (L) 07/07/2016   Lab Results  Component Value Date   CHOL 92 11/09/2016   TRIG 54 11/09/2016   HDL 39 (L) 11/09/2016   CHOLHDL 2.4 11/09/2016   VLDL 11 11/09/2016   LDLCALC 42 11/09/2016   LDLCALC 72 10/09/2016    Physical Findings: AIMS: Facial and Oral Movements Muscles of Facial Expression: Mild Lips and Perioral Area: Mild Jaw: Minimal Tongue: None, normal,Extremity Movements Upper (arms, wrists, hands, fingers): Mild Lower (legs, knees, ankles, toes): Mild, Trunk Movements Neck, shoulders, hips: None, normal, Overall Severity Severity of abnormal movements (highest score from questions above): Mild Incapacitation due to abnormal movements: Minimal Patient's awareness of abnormal movements (rate only patient's report): No Awareness, Dental Status Current problems with teeth and/or dentures?: No Does patient usually wear dentures?: No  CIWA:    COWS:     Musculoskeletal: Strength & Muscle Tone: within normal limits Gait & Station: normal Patient leans: [name]  Psychiatric Specialty Exam: Physical Exam  Vitals reviewed. Constitutional: He appears  well-developed.  Cardiovascular: Normal rate.   Neurological: He is alert.  Psychiatric: He has a normal mood and affect. His behavior is normal.    Review of Systems  Psychiatric/Behavioral: Positive for depression and hallucinations. The patient has insomnia.     Blood pressure 111/69, pulse 89, temperature 99.4 F (37.4 C), temperature source Oral, resp. rate 16, height 6' (1.829 m), weight 81.4 kg (179 lb 8 oz), SpO2 100 %.Body mass index is 24.34 kg/m.  General Appearance: Guarded  Eye Contact:  Fair  Speech:  Clear and Coherent and Slow  Volume:  Decreased  Mood:  Depressed and Dysphoric  Affect:  Blunt, Depressed and Flat  Thought Process:  Coherent  Orientation:  Full (Time, Place, and Person)  Thought Content:  Hallucinations: Auditory, Paranoid Ideation and Rumination  Suicidal Thoughts:  No  Homicidal Thoughts:  No  Memory:  Immediate;   Fair Recent;   Fair Remote;   Fair  Judgement:  Fair  Insight:  Fair  Psychomotor Activity:  Restlessness  Concentration:  Concentration: Fair  Recall:  [employer] of Knowledge:  Fair  Language:  Fair  Akathisia:  No  Handed:  Right  AIMS (if indicated):     Assets:  Desire for Improvement Resilience Social Support  ADL's:  Intact  Cognition:  WNL  Sleep:  Number of Hours: 6     I agree with current treatment plan on 12/26/2016, Patient seen face-to-face for psychiatric evaluation follow-up, chart reviewed. Reviewed the information documented and agree with the treatment plan.  Treatment Plan Summary: Daily contact with patient to assess and evaluate symptoms and progress in treatment and Medication  management   Medical:   Diabetes Mellitus: Initiated Lantus 45 units Q hs. Increased meal time insulin coverage from 4 units to 6 units tid. Continue Metformin,000 mg bid. HTN: Continue Lisinopril 20 mg daily. High Cholesterol:  Continue Zocor 20 mg Q evenings. Will continue to monitor for any symptoms & treat on a prn  basis.  Psychosocial:  Disabled  Will encourage group counseling attendance & participation.  Contiune with current treatment plan 12-25-16: See new changes made on the current plan of care, continue current regimen as recommended.  Anxiety/HTN: Continue Propranolol 20 mg daily.  Mood Control/Stabilization:  Continue  Clozapine (Clozaril) from 150mg  Q hs.  Depression: Continue Fluvoxamine (Luvox) 100 mg Q hs.  Insomnia: Continue Trazodone  50 mg prn at bedtime.  Will continue the nicotine gum. Continue to monitor mood, behavior and interaction with peers  , NP 12/26/2016, 11:01 AM  Notes reviewed and agree with plan

## 2016-12-27 LAB — GLUCOSE, CAPILLARY
GLUCOSE-CAPILLARY: 71 mg/dL (ref 65–99)
GLUCOSE-CAPILLARY: 304 mg/dL — AB (ref 65–99)
GLUCOSE-CAPILLARY: 59 mg/dL — AB (ref 65–99)
GLUCOSE-CAPILLARY: 347 mg/dL — AB (ref 65–99)
Glucose-Capillary: 357 mg/dL — ABNORMAL HIGH (ref 65–99)
Glucose-Capillary: 407 mg/dL — ABNORMAL HIGH (ref 65–99)
Glucose-Capillary: 126 mg/dL — ABNORMAL HIGH (ref 65–99)

## 2016-12-27 NOTE — Progress Notes (Signed)
D: Patient met in his room sleeping. CBG at this time is 347 mg/dl. Insulin Lantus 45 ml given as scheduled. Patient denies pain, SI/HI, AH/VH at this time. States "I'm tired. Just want to sleep. Patient is very minimal in conversation.   A: Staff encouraged patient to participate in his treatment plans and verbalize needs to staff. Routine safety checks maintained. Will continue to monitor patient.  R: Patient remains safe on unit.

## 2016-12-27 NOTE — [facility] Group Notes (Signed)
[facility] Group Notes: (Clinical Social Work)   12/27/2016      Type of Therapy:  Group Therapy   Participation Level:  Did Not Attend despite MHT prompting   Duane Trias Grossman-Orr, LCSW 12/27/2016, 12:33 PM     

## 2016-12-27 NOTE — Progress Notes (Signed)
Patient 1700 CBH was 59.  Charge nurse, [facility] and NP Jacki Cones informed.  Patient given snack and orange juice.  Waited 15 minute and CBG repeated with 126 result.  AC/NP informed.  Patient sitting up in bed, ready to go to dining room for dinner. Respirations even and unlabored.  No signs/symptoms of pain/distress noted on patient's face/body movements.  Safety maintained with 15 minute checks.

## 2016-12-27 NOTE — Progress Notes (Signed)
Pt did not attend wrap up group meeting this evening.  

## 2016-12-27 NOTE — Plan of Care (Signed)
Problem: Education: Goal: Utilization of techniques to improve thought processes will improve Outcome: Progressing Nurse discussed depression/anxiety/coping skills with patient.    

## 2016-12-27 NOTE — Progress Notes (Signed)
[facility] MD Progress Note  12/27/2016 10:17 AM Tony Cunningham  MRN:  [id number] Subjective:  Tony Cunningham reports " I just need to sleep. Im tired and yall dont ever let us sleep.."  Objective: Tony Cunningham is fatigue, alert and oriented. At the time of the evaluation he is observed sleeping. He will respond to his name being called, and then right back to sleep. WHile observing him it is noticed that he has involuntary tics, tremors, and twitches. Patient reports being extremly tired today, and would like some rest. Patient answers question with short "yes and no" responses and head nodding. denies depression and reports that he is taken is medication as prescribed.   Denies suicidal or homicidal ideation during this assessment.  He is encouraged to attend group. He does not appear vested in treatment at this time. He declined groups and reports not having a goal for today. He is unable to report any progress in treatment or plan.   Principal Problem: Schizoaffective disorder, bipolar type (HCC) Diagnosis:   Patient Active Problem List   Diagnosis Date Noted  . Schizoaffective disorder, bipolar type (HCC) [F25.0] 12/22/2016  . Schizoaffective disorder, depressive type (HCC) [F25.1] 09/23/2016  . Polysubstance abuse [F19.10] 07/17/2016  . Tardive dyskinesia [G24.01] 07/16/2016  . Tobacco use disorder [F17.200] 07/07/2016  . Dyslipidemia [E78.5] 07/07/2016  . Asthma [J45.909] 07/07/2016  . HTN (hypertension) [I10] 07/06/2016  . Diabetes (HCC) [E11.9] 12/25/2010   Total Time spent with patient: 20 minutes  Past Psychiatric History: The patient has a long history of chronic mental illness and has been on multiple different psychotropic medications in the past. He has had a difficult time finding a medication that he tolerates and benefits from. The patient has had some self injury in the past. He is followed by Dr. Mila Palmer at [facility] in [place].  Past Medical History:  Past Medical History:  Diagnosis Date  .  Anxiety   . Asthma   . Diabetes mellitus   . High blood pressure   . Sinus complaint    History reviewed. No pertinent surgical history. Family History: History reviewed. No pertinent family history. Family Psychiatric  History: Social History:  History  Alcohol Use No     History  Drug Use No    Social History   Social History  . Marital status: Single    Spouse name: [name]  . Number of children: [name]  . Years of education: [name]   Social History Main Topics  . Smoking status: Current Every Day Smoker    Packs/day: 0.50    Types: Cigarettes  . Smokeless tobacco: Never Used  . Alcohol use No  . Drug use: No  . Sexual activity: Not Currently   Other Topics Concern  . None   Social History Narrative  . None   Additional Social History:                         Sleep: Fair  Appetite:  Fair  Current Medications: Current Facility-Administered Medications  Medication Dose Route Frequency Provider Last Rate Last Dose  . acetaminophen (TYLENOL) tablet 650 mg  650 mg Oral Q6H PRN Charm Rings, NP      . alum & mag hydroxide-simeth (MAALOX/MYLANTA) 200-200-20 MG/5ML suspension 30 mL  30 mL Oral Q6H PRN Charm Rings, NP      . cloZAPine (CLOZARIL) tablet 150 mg  150 mg Oral QHS Oneta Rack, NP   150 mg at 12/26/16  2112  . fluvoxaMINE (LUVOX) tablet 100 mg  100 mg Oral QHS Charm Rings, NP   100 mg at 12/26/16 2112  . ibuprofen (ADVIL,MOTRIN) tablet 600 mg  600 mg Oral Q8H PRN Charm Rings, NP   600 mg at 12/24/16 5009  . insulin aspart (novoLOG) injection 0-15 Units  0-15 Units Subcutaneous TID WC Charm Rings, NP   5 Units at 12/26/16 1717  . insulin aspart (novoLOG) injection 0-5 Units  0-5 Units Subcutaneous QHS Charm Rings, NP   4 Units at 12/26/16 2100  . insulin aspart (novoLOG) injection 6 Units  6 Units Subcutaneous TID WC Armandina Stammer I, NP   6 Units at 12/27/16 [phone]  . insulin glargine (LANTUS) injection 45 Units  45 Units Subcutaneous  QHS Armandina Stammer I, NP   45 Units at 12/26/16 2100  . lisinopril (PRINIVIL,ZESTRIL) tablet 20 mg  20 mg Oral Daily Charm Rings, NP   20 mg at 12/27/16 0756  . magnesium hydroxide (MILK OF MAGNESIA) suspension 30 mL  30 mL Oral Daily PRN Charm Rings, NP      . metFORMIN (GLUCOPHAGE) tablet 1,000 mg  1,000 mg Oral BID WC Charm Rings, NP   1,000 mg at 12/27/16 0942  . montelukast (SINGULAIR) tablet 10 mg  10 mg Oral QHS Charm Rings, NP   10 mg at 12/26/16 2112  . nicotine polacrilex (NICORETTE) gum 2 mg  2 mg Oral PRN Jomarie Longs, MD      . ondansetron (ZOFRAN) tablet 4 mg  4 mg Oral Q8H PRN Charm Rings, NP      . pantoprazole (PROTONIX) EC tablet 40 mg  40 mg Oral Daily Charm Rings, NP   40 mg at 12/27/16 0756  . polyethylene glycol (MIRALAX / GLYCOLAX) packet 17 g  17 g Oral Daily Charm Rings, NP   17 g at 12/24/16 0818  . propranolol (INDERAL) tablet 20 mg  20 mg Oral BID Charm Rings, NP   20 mg at 12/27/16 0756  . simvastatin (ZOCOR) tablet 20 mg  20 mg Oral q1800 Cobos, Rockey Situ, MD   20 mg at 12/26/16 1717  . traZODone (DESYREL) tablet 50 mg  50 mg Oral QHS PRN Cobos, Rockey Situ, MD   50 mg at 12/23/16 2235  . tuberculin injection 5 Units  5 Units Intradermal Once Nelly Rout, MD   5 Units at 12/25/16 1157    Lab Results:  Results for orders placed or performed during the hospital encounter of 12/22/16 (from the past 48 hour(s))  Glucose, capillary     Status: Abnormal   Collection Time: 12/25/16 12:05 PM  Result Value Ref Range   Glucose-Capillary 190 (H) 65 - 99 mg/dL  Glucose, capillary     Status: Abnormal   Collection Time: 12/25/16  5:16 PM  Result Value Ref Range   Glucose-Capillary 414 (H) 65 - 99 mg/dL  Glucose, capillary     Status: Abnormal   Collection Time: 12/25/16  7:21 PM  Result Value Ref Range   Glucose-Capillary 373 (H) 65 - 99 mg/dL  Glucose, capillary     Status: Abnormal   Collection Time: 12/25/16  8:31 PM  Result Value  Ref Range   Glucose-Capillary 256 (H) 65 - 99 mg/dL  Glucose, capillary     Status: Abnormal   Collection Time: 12/26/16  6:14 AM  Result Value Ref Range   Glucose-Capillary 174 (H) 65 - 99  mg/dL  Glucose, capillary     Status: Abnormal   Collection Time: 12/26/16 12:20 PM  Result Value Ref Range   Glucose-Capillary 212 (H) 65 - 99 mg/dL  Glucose, capillary     Status: Abnormal   Collection Time: 12/26/16  4:56 PM  Result Value Ref Range   Glucose-Capillary 242 (H) 65 - 99 mg/dL  Glucose, capillary     Status: Abnormal   Collection Time: 12/26/16  8:03 PM  Result Value Ref Range   Glucose-Capillary 305 (H) 65 - 99 mg/dL  Glucose, capillary     Status: None   Collection Time: 12/27/16  6:18 AM  Result Value Ref Range   Glucose-Capillary 71 65 - 99 mg/dL  Glucose, capillary     Status: Abnormal   Collection Time: 12/27/16  9:49 AM  Result Value Ref Range   Glucose-Capillary 407 (H) 65 - 99 mg/dL    Blood Alcohol level:  Lab Results  Component Value Date   ETH <5 12/21/2016   ETH <5 10/21/2016    Metabolic Disorder Labs: Lab Results  Component Value Date   HGBA1C 10.3 (H) 11/09/2016   MPG 249 11/09/2016   MPG 275 10/09/2016   Lab Results  Component Value Date   PROLACTIN 24.5 (H) 09/24/2016   PROLACTIN 3.4 (L) 07/07/2016   Lab Results  Component Value Date   CHOL 92 11/09/2016   TRIG 54 11/09/2016   HDL 39 (L) 11/09/2016   CHOLHDL 2.4 11/09/2016   VLDL 11 11/09/2016   LDLCALC 42 11/09/2016   LDLCALC 72 10/09/2016    Physical Findings: AIMS: Facial and Oral Movements Muscles of Facial Expression: Mild Lips and Perioral Area: Mild Jaw: Minimal Tongue: None, normal,Extremity Movements Upper (arms, wrists, hands, fingers): Mild Lower (legs, knees, ankles, toes): Mild, Trunk Movements Neck, shoulders, hips: None, normal, Overall Severity Severity of abnormal movements (highest score from questions above): Mild Incapacitation due to abnormal movements:  Minimal Patient's awareness of abnormal movements (rate only patient's report): No Awareness, Dental Status Current problems with teeth and/or dentures?: No Does patient usually wear dentures?: No  CIWA:    COWS:     Musculoskeletal: Strength & Muscle Tone: within normal limits Gait & Station: normal Patient leans: [name]  Psychiatric Specialty Exam: Physical Exam  Vitals reviewed. Constitutional: He appears well-developed.  Cardiovascular: Normal rate.   Neurological: He is alert.  Psychiatric: He has a normal mood and affect. His behavior is normal.    Review of Systems  Psychiatric/Behavioral: Positive for depression and hallucinations. The patient has insomnia.     Blood pressure 102/70, pulse 85, temperature 98.9 F (37.2 C), resp. rate 16, height 6' (1.829 m), weight 81.4 kg (179 lb 8 oz), SpO2 100 %.Body mass index is 24.34 kg/m.  General Appearance: Disheveled and Guarded  Eye Contact:  Fair  Speech:  Clear and Coherent and Slow  Volume:  Decreased  Mood:  Depressed and Dysphoric  Affect:  Blunt, Depressed and Flat  Thought Process:  Linear and Descriptions of Associations: Intact  Orientation:  Full (Time, Place, and Person)  Thought Content:  Hallucinations: Auditory  Suicidal Thoughts:  No  Homicidal Thoughts:  No  Memory:  Immediate;   Fair Recent;   Fair Remote;   Fair  Judgement:  Fair  Insight:  Fair  Psychomotor Activity:  Restlessness and Tremor  Concentration:  Concentration: Poor and Attention Span: Poor  Recall:  [employer] of Knowledge:  Fair  Language:  Fair  Akathisia:  No  Handed:  Right  AIMS (if indicated):     Assets:  Desire for Improvement Resilience Social Support  ADL's:  Intact  Cognition:  WNL  Sleep:  Number of Hours: 6.75     I agree with current treatment plan on 12/26/2016, Patient seen face-to-face for psychiatric evaluation follow-up, chart reviewed. Reviewed the information documented and agree with the treatment  plan.  Treatment Plan Summary: Daily contact with patient to assess and evaluate symptoms and progress in treatment and Medication management   Medical:   Diabetes Mellitus: Initiated Lantus 45 units Q hs. Increased meal time insulin coverage from 4 units to 6 units tid. Continue Metformin, 1000 mg bid. Sugars are unstable and erratic at this time. He reports eating a full breakfast, staff reports he ate 20%. Patent maybe experiencing rebound hypoglycemia.  HTN: Continue Lisinopril 20 mg daily. High Cholesterol:  Continue Zocor 20 mg Q evenings. Will continue to monitor for any symptoms & treat on a prn basis.  Psychosocial:  Disabled  Will encourage group counseling attendance & participation.  Contiune with current treatment plan 12/27/2016: See new changes made on the current plan of care, continue current regimen as recommended.  Anxiety/HTN: Continue Propranolol 20 mg daily.  Mood Control/Stabilization:  Continue  Clozapine (Clozaril) from 150mg  Q hs.  Depression: Continue Fluvoxamine (Luvox) 100 mg Q hs.  Insomnia: Continue Trazodone  50 mg prn at bedtime.  Will continue the nicotine gum. Continue to monitor mood, behavior and interaction with peers  Truman Hayward, FNP 12/27/2016, 10:17 AM

## 2016-12-27 NOTE — Progress Notes (Signed)
Patient has been in bed this morning, up to bathroom several times.  Patient denied SI and HI, contracts for safety.  Denied A/V hallucinations.  Denied pain.  Lunch brought to patient in his room.  Respirations even and unlabored.  No signs/symptoms of pain/distress noted on patient's face/body movements.  Safety maintained with 15 minute checks.

## 2016-12-27 NOTE — Progress Notes (Signed)
Patient's CBG was 357.  NP informed.  Patient laying in bed with eyes open.  Patient denied SI and HI.  Denied A/V hallucinations.  Denied pain.  Respirations even and unlabored.  No signs/symptoms of pain/distress noted on patient's face/body movements.

## 2016-12-27 NOTE — Plan of Care (Signed)
Problem: Medication: Goal: Compliance with prescribed medication regimen will improve Outcome: Progressing Patient is taking medication as ordered and is compliant to regimen

## 2016-12-27 NOTE — Progress Notes (Signed)
CBG 71 this morning.  Nurse held metformin and novolog 6 unit this morning until MD instructions given.  NP stated to have patient eat snack, give metformin 1000 mg and retake CBG.

## 2016-12-27 NOTE — Progress Notes (Signed)
Patient's morning CBG was 71.  Patient ate approximately 20% breakfast.  Nurse held metformin and novolog 6 units until NP informed.   NP stated to give patient a snack, retake CBG which was 407 and give metformin 1,000 mg and novolog 6 units.

## 2016-12-28 DIAGNOSIS — G2111 Neuroleptic induced parkinsonism: Secondary | ICD-10-CM | POA: Clinically undetermined

## 2016-12-28 LAB — LIPID PANEL
Cholesterol: 129 mg/dL (ref 0–200)
HDL: 54 mg/dL (ref >40)
LDL CALC: 59 mg/dL (ref 0–99)
TRIGLYCERIDES: 78 mg/dL (ref <150)
Total CHOL/HDL Ratio: 2.4 RATIO
VLDL: 16 mg/dL (ref 0–40)

## 2016-12-28 LAB — TSH: TSH: 2.629 u[IU]/mL (ref 0.350–4.500)

## 2016-12-28 LAB — GLUCOSE, CAPILLARY
Glucose-Capillary: 150 mg/dL — ABNORMAL HIGH (ref 65–99)
Glucose-Capillary: 188 mg/dL — ABNORMAL HIGH (ref 65–99)
Glucose-Capillary: 271 mg/dL — ABNORMAL HIGH (ref 65–99)
Glucose-Capillary: 220 mg/dL — ABNORMAL HIGH (ref 65–99)

## 2016-12-28 LAB — VITAMIN B12: VITAMIN B 12: 457 pg/mL (ref 180–914)

## 2016-12-28 MED ORDER — AMANTADINE HCL 100 MG PO CAPS
100.0000 mg | ORAL_CAPSULE | Freq: Two times a day (BID) | ORAL | Status: DC
  Administered 2016-12-28 – 2017-01-15 (×35): 100 mg via ORAL
  Filled 2016-12-28 (×38): qty 1

## 2016-12-28 NOTE — Progress Notes (Addendum)
[facility] MD Progress Note  12/28/2016 2:36 PM Tony Cunningham  MRN:  [id number] Subjective: Girolamo states " I want to go to [facility]."   Objective:Patient seen and chart reviewed.Discussed patient with treatment team.  Pt today noted as tremulous , O/E - has cogwheeling rigidty - BL arms , tremors - BL hands . Pt unable to give details about how long this has been present. Pt otherwise seen as minimally responding to questions asked. Reports he wants to go to an ALF in [place]. Pt vaguely irritable , minimum eye contact. Pt compliant on medications . Continues to need encouragement and support.    Principal Problem: Schizoaffective disorder, bipolar type (HCC) Diagnosis:   Patient Active Problem List   Diagnosis Date Noted  . 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 Schizoaffective disorder, bipolar type (HCC) [F25.0] 12/22/2016  . Schizoaffective disorder, depressive type (HCC) [F25.1] 09/23/2016  . Polysubstance abuse [F19.10] 07/17/2016  . Tardive dyskinesia [G24.01] 07/16/2016  . Tobacco use disorder [F17.200] 07/07/2016  . Dyslipidemia [E78.5] 07/07/2016  . Asthma [J45.909] 07/07/2016  . HTN (hypertension) [I10] 07/06/2016  . Diabetes (HCC) [E11.9] 12/25/2010   Total Time spent with patient: 25 minutes  Past Psychiatric History: Please see H&P.   Past Medical History:  Past Medical History:  Diagnosis Date  . Anxiety   . Asthma   . Diabetes mellitus   . High blood pressure   . Sinus complaint    History reviewed. No pertinent surgical history. Family History: Please see H&P.  Family Psychiatric  History:Please see H&P.  Social History:  History  Alcohol Use No     History  Drug Use No    Social History   Social History  . Marital status: Single    Spouse name: [name]  . Number of children: [name]  . Years of education: [name]   Social History Main Topics  . Smoking status: Current Every Day Smoker    Packs/day: 0.50    Types: Cigarettes  .  Smokeless tobacco: Never Used  . Alcohol use No  . Drug use: No  . Sexual activity: Not Currently   Other Topics Concern  . None   Social History Narrative  . None   Additional Social History:                         Sleep: Fair  Appetite:  Fair  Current Medications: Current Facility-Administered Medications  Medication Dose Route Frequency Provider Last Rate Last Dose  . acetaminophen (TYLENOL) tablet 650 mg  650 mg Oral Q6H PRN Charm Rings, NP      . alum & mag hydroxide-simeth (MAALOX/MYLANTA) 200-200-20 MG/5ML suspension 30 mL  30 mL Oral Q6H PRN Charm Rings, NP      . amantadine (SYMMETREL) capsule 100 mg  100 mg Oral BID Janie Strothman, Levin Bacon, MD      . cloZAPine (CLOZARIL) tablet 150 mg  150 mg Oral QHS Oneta Rack, NP   150 mg at 12/27/16 2138  . fluvoxaMINE (LUVOX) tablet 100 mg  100 mg Oral QHS Charm Rings, NP   100 mg at 12/27/16 2138  . ibuprofen (ADVIL,MOTRIN) tablet 600 mg  600 mg Oral Q8H PRN Charm Rings, NP   600 mg at 12/28/16 1216  . insulin aspart (novoLOG) injection 0-15 Units  0-15 Units Subcutaneous TID WC Charm Rings, NP   3 Units at 12/28/16 1213  . insulin aspart (novoLOG) injection 0-5 Units  0-5  Units Subcutaneous QHS Charm Rings, NP   4 Units at 12/27/16 2141  . insulin aspart (novoLOG) injection 6 Units  6 Units Subcutaneous TID WC Armandina Stammer I, NP   6 Units at 12/28/16 1214  . insulin glargine (LANTUS) injection 45 Units  45 Units Subcutaneous QHS Armandina Stammer I, NP   45 Units at 12/27/16 2005  . lisinopril (PRINIVIL,ZESTRIL) tablet 20 mg  20 mg Oral Daily Charm Rings, NP   20 mg at 12/28/16 0801  . magnesium hydroxide (MILK OF MAGNESIA) suspension 30 mL  30 mL Oral Daily PRN Charm Rings, NP      . metFORMIN (GLUCOPHAGE) tablet 1,000 mg  1,000 mg Oral BID WC Charm Rings, NP   1,000 mg at 12/28/16 0801  . montelukast (SINGULAIR) tablet 10 mg  10 mg Oral QHS Charm Rings, NP   10 mg at 12/27/16 2138  .  nicotine polacrilex (NICORETTE) gum 2 mg  2 mg Oral PRN Fey Coghill, Levin Bacon, MD      . ondansetron (ZOFRAN) tablet 4 mg  4 mg Oral Q8H PRN Charm Rings, NP      . pantoprazole (PROTONIX) EC tablet 40 mg  40 mg Oral Daily Charm Rings, NP   40 mg at 12/28/16 0801  . polyethylene glycol (MIRALAX / GLYCOLAX) packet 17 g  17 g Oral Daily Charm Rings, NP   17 g at 12/24/16 0818  . propranolol (INDERAL) tablet 20 mg  20 mg Oral BID Charm Rings, NP   20 mg at 12/28/16 0801  . simvastatin (ZOCOR) tablet 20 mg  20 mg Oral q1800 Cobos, Rockey Situ, MD   20 mg at 12/27/16 1725  . traZODone (DESYREL) tablet 50 mg  50 mg Oral QHS PRN Cobos, Rockey Situ, MD   50 mg at 12/23/16 2235    Lab Results:  Results for orders placed or performed during the hospital encounter of 12/22/16 (from the past 48 hour(s))  Glucose, capillary     Status: Abnormal   Collection Time: 12/26/16  4:56 PM  Result Value Ref Range   Glucose-Capillary 242 (H) 65 - 99 mg/dL  Glucose, capillary     Status: Abnormal   Collection Time: 12/26/16  8:03 PM  Result Value Ref Range   Glucose-Capillary 305 (H) 65 - 99 mg/dL  Glucose, capillary     Status: None   Collection Time: 12/27/16  6:18 AM  Result Value Ref Range   Glucose-Capillary 71 65 - 99 mg/dL  Glucose, capillary     Status: Abnormal   Collection Time: 12/27/16  9:49 AM  Result Value Ref Range   Glucose-Capillary 407 (H) 65 - 99 mg/dL  Glucose, capillary     Status: Abnormal   Collection Time: 12/27/16 10:24 AM  Result Value Ref Range   Glucose-Capillary 357 (H) 65 - 99 mg/dL  Glucose, capillary     Status: Abnormal   Collection Time: 12/27/16 11:34 AM  Result Value Ref Range   Glucose-Capillary 304 (H) 65 - 99 mg/dL  Glucose, capillary     Status: Abnormal   Collection Time: 12/27/16  4:43 PM  Result Value Ref Range   Glucose-Capillary 59 (L) 65 - 99 mg/dL  Glucose, capillary     Status: Abnormal   Collection Time: 12/27/16  5:10 PM  Result Value Ref  Range   Glucose-Capillary 126 (H) 65 - 99 mg/dL  Glucose, capillary     Status: Abnormal   Collection  Time: 12/27/16  7:26 PM  Result Value Ref Range   Glucose-Capillary 347 (H) 65 - 99 mg/dL  Glucose, capillary     Status: Abnormal   Collection Time: 12/28/16  5:56 AM  Result Value Ref Range   Glucose-Capillary 150 (H) 65 - 99 mg/dL  TSH     Status: None   Collection Time: 12/28/16  6:28 AM  Result Value Ref Range   TSH 2.629 0.350 - 4.500 uIU/mL    Comment: Performed by a 3rd Generation assay with a functional sensitivity of <=0.01 uIU/mL. Performed at [facility], [address], [place], [place] [address]   Lipid panel     Status: None   Collection Time: 12/28/16  6:28 AM  Result Value Ref Range   Cholesterol 129 0 - 200 mg/dL   Triglycerides 78 <226 mg/dL   HDL 54 >33 mg/dL   Total CHOL/HDL Ratio 2.4 RATIO   VLDL 16 0 - 40 mg/dL   LDL Cholesterol 59 0 - 99 mg/dL    Comment:        Total Cholesterol/HDL:CHD Risk Coronary Heart Disease Risk Table                     Men   Women  1/2 Average Risk   3.4   3.3  Average Risk       5.0   4.4  2 X Average Risk   9.6   7.1  3 X Average Risk  23.4   11.0        Use the calculated Patient Ratio above and the CHD Risk Table to determine the patient's CHD Risk.        ATP III CLASSIFICATION (LDL):  <100     mg/dL   Optimal  354-562  mg/dL   Near or Above                    Optimal  130-159  mg/dL   Borderline  563-893  mg/dL   High  >734     mg/dL   Very High Performed at [facility], [address] [place], [place] [address]   Vitamin B12     Status: None   Collection Time: 12/28/16  6:28 AM  Result Value Ref Range   Vitamin B-12 457 180 - 914 pg/mL    Comment: (NOTE) This assay is not validated for testing neonatal or myeloproliferative syndrome specimens for Vitamin B12 levels. Performed at [facility], [address] [place], [place] [address]   Glucose, capillary     Status:  Abnormal   Collection Time: 12/28/16 11:27 AM  Result Value Ref Range   Glucose-Capillary 188 (H) 65 - 99 mg/dL   Comment 1 Notify RN    Comment 2 Document in Chart     Blood Alcohol level:  Lab Results  Component Value Date   ETH <5 12/21/2016   ETH <5 10/21/2016    Metabolic Disorder Labs: Lab Results  Component Value Date   HGBA1C 10.3 (H) 11/09/2016   MPG 249 11/09/2016   MPG 275 10/09/2016   Lab Results  Component Value Date   PROLACTIN 24.5 (H) 09/24/2016   PROLACTIN 3.4 (L) 07/07/2016   Lab Results  Component Value Date   CHOL 129 12/28/2016   TRIG 78 12/28/2016   HDL 54 12/28/2016   CHOLHDL 2.4 12/28/2016   VLDL 16 12/28/2016   LDLCALC 59 12/28/2016   LDLCALC  42 11/09/2016    Physical Findings: AIMS: Facial and Oral Movements Muscles of Facial Expression: None, normal Lips and Perioral Area: None, normal Jaw: None, normal Tongue: None, normal,Extremity Movements Upper (arms, wrists, hands, fingers): Mild Lower (legs, knees, ankles, toes): None, normal, Trunk Movements Neck, shoulders, hips: None, normal, Overall Severity Severity of abnormal movements (highest score from questions above): Mild Incapacitation due to abnormal movements: Mild Patient's awareness of abnormal movements (rate only patient's report): Aware, no distress, Dental Status Current problems with teeth and/or dentures?: No Does patient usually wear dentures?: No  CIWA:  CIWA-Ar Total: 4 COWS:  COWS Total Score: 4  Musculoskeletal: Strength & Muscle Tone: within normal limits Gait & Station: normal Patient leans: [name]  Psychiatric Specialty Exam: Physical Exam  Nursing note and vitals reviewed.   Review of Systems  Psychiatric/Behavioral: The patient is nervous/anxious.   All other systems reviewed and are negative.   Blood pressure 124/69, pulse 85, temperature 98.8 F (37.1 C), temperature source Oral, resp. rate 17, height 6' (1.829 m), weight 81.4 kg (179 lb 8 oz), SpO2  100 %.Body mass index is 24.34 kg/m.  General Appearance: Guarded  Eye Contact:  Minimal  Speech:  Slow  Volume:  Decreased  Mood:  Dysphoric  Affect:  Constricted  Thought Process:  Linear and Descriptions of Associations: Circumstantial  Orientation:  Other:  self, place  Thought Content:  Paranoid Ideation and Rumination  Suicidal Thoughts:  No  Homicidal Thoughts:  No  Memory:  Immediate;   Fair Recent;   Fair Remote;   Fair  Judgement:  Impaired  Insight:  Fair  Psychomotor Activity:  Restlessness and Tremor  Concentration:  Concentration: Fair and Attention Span: Fair  Recall:  [employer] of Knowledge:  Fair  Language:  Fair  Akathisia:  No  Handed:  Right  AIMS (if indicated):   tremors present BL hands , cog wheeling rigidity pos- BL  Assets:  Desire for Improvement Resilience Social Support  ADL's:  Intact  Cognition:  WNL  Sleep:  Number of Hours: 3.75   Treatment Plan Summary:Patient with hx of schizoaffective do, past hx pf ECT treatments, presented recently due to decompensation , today continues to progress. Currently on clozaril , has tremors and rigidity , from ADRs to medications . Will continue to support and treat. Daily contact with patient to assess and evaluate symptoms and progress in treatment and Medication management    Reviewed past medical records,treatment plan.  Clozaril 150 mg po qhs for psychosis. Will get labs - Clozaril level. Will add Amantadine 100 mg po bid for neuroleptic induced tremors. Will continue Luvox 100 mg po at qhs for affective sx. Trazodone 50 mg po qhs prn for insomnia. Propranolol 20 mg po bid for tremors, anxiety. Restarted home medications as indicated. Will continue to monitor vitals ,medication compliance and treatment side effects while patient is here.  Will monitor for medical issues as well as call consult as needed.  Reviewed labs tsh -wnl, vitamin b12 - wnl. CSW will continue working on disposition.   Patient to participate in therapeutic milieu .       Sary Bogie, MD 12/28/2016, 2:36 PM

## 2016-12-28 NOTE — Progress Notes (Signed)
Recreation Therapy Notes  Date: 12/28/2016 Time: 10:00am Location: [address] Dayroom  Group Topic: Communication  Goal Area(s) Addresses:  Pts will work on Manufacturing systems engineer by guessing what picture is being drawn on the whiteboard.  Behavioral Response: Engaged  Intervention: Game  Activity: Pts will take turns choosing a random word out a coffee tin and drawing the picture on the whiteboard. Pts will try to guess what the drawing is.  Education:Communication, Discharge Planning  Education Outcome: Acknowledges understanding   Clinical Observations/Feedback: Pt actively participated in activity by taking turns drawing and guessing the pictures on the whiteboard.   Marvell Fuller, Recreation Therapy Intern    [name], LRT/CTRS    Marvell Fuller 12/28/2016 12:44 PM

## 2016-12-28 NOTE — Progress Notes (Signed)
Recreation Therapy Notes  INPATIENT RECREATION THERAPY ASSESSMENT  Patient Details Name: Camil Wilhelmsen MRN: [id number] DOB: 11/15/1975 Today's Date: 12/28/2016  Pt reported his reason for admission was due to suicidal thoughts.  Patient Stressors: Family, Friends, Work  Coping Skills:   Isolate, Music, Sports  Personal Challenges: Anger, Communication, Concentration, Decision-Making, Expressing Yourself, Problem-Solving, Relationships, Self-Esteem/Confidence, Social Interaction, Stress Management, Time Management, Trusting Others  Leisure Interests (2+):  Games - Video games  Awareness of Community Resources:  Yes  Community Resources:  Engineering geologist, Research scientist (physical sciences)  Current Use: Yes  If no, Barriers?:    Patient Strengths:  "can't think of any"  Patient Identified Areas of Improvement:  "do the best I can"  Current Recreation Participation:  4 days a wk  Patient Goal for Hospitalization:  "try to get better"  [place] of Residence:  [place] of Residence:  [place]   Current SI (including self-harm):  No  Current HI:  No  Consent to Intern Participation: Yes  Marvell Fuller, Recreation Therapy Intern  Marvell Fuller 12/28/2016, 12:39 PM    [name], LRT/CTRS

## 2016-12-28 NOTE — Tx Team (Signed)
Interdisciplinary Treatment and Diagnostic Plan Update  12/28/2016 Time of Session: 12:59 PM  Tony Cunningham MRN: [id number]  Principal Diagnosis: Schizoaffective Disorder, Bipolar Type  Secondary Diagnoses: Principal Problem:   Schizoaffective disorder, bipolar type ([place])   Current Medications:  Current Facility-Administered Medications  Medication Dose Route Frequency Provider Last Rate Last Dose  . acetaminophen (TYLENOL) tablet 650 mg  650 mg Oral Q6H PRN Patrecia Pour, NP      . alum & mag hydroxide-simeth (MAALOX/MYLANTA) 200-200-20 MG/5ML suspension 30 mL  30 mL Oral Q6H PRN Patrecia Pour, NP      . cloZAPine (CLOZARIL) tablet 150 mg  150 mg Oral QHS Derrill Center, NP   150 mg at 12/27/16 2138  . fluvoxaMINE (LUVOX) tablet 100 mg  100 mg Oral QHS Patrecia Pour, NP   100 mg at 12/27/16 2138  . ibuprofen (ADVIL,MOTRIN) tablet 600 mg  600 mg Oral Q8H PRN Patrecia Pour, NP   600 mg at 12/28/16 1216  . insulin aspart (novoLOG) injection 0-15 Units  0-15 Units Subcutaneous TID WC Patrecia Pour, NP   3 Units at 12/28/16 1213  . insulin aspart (novoLOG) injection 0-5 Units  0-5 Units Subcutaneous QHS Patrecia Pour, NP   4 Units at 12/27/16 2141  . insulin aspart (novoLOG) injection 6 Units  6 Units Subcutaneous TID WC Lindell Spar I, NP   6 Units at 12/28/16 1214  . insulin glargine (LANTUS) injection 45 Units  45 Units Subcutaneous QHS Lindell Spar I, NP   45 Units at 12/27/16 2005  . lisinopril (PRINIVIL,ZESTRIL) tablet 20 mg  20 mg Oral Daily Patrecia Pour, NP   20 mg at 12/28/16 0801  . magnesium hydroxide (MILK OF MAGNESIA) suspension 30 mL  30 mL Oral Daily PRN Patrecia Pour, NP      . metFORMIN (GLUCOPHAGE) tablet 1,000 mg  1,000 mg Oral BID WC Patrecia Pour, NP   1,000 mg at 12/28/16 0801  . montelukast (SINGULAIR) tablet 10 mg  10 mg Oral QHS Patrecia Pour, NP   10 mg at 12/27/16 2138  . nicotine polacrilex (NICORETTE) gum 2 mg  2 mg Oral PRN Eappen, Ria Clock, MD       . ondansetron (ZOFRAN) tablet 4 mg  4 mg Oral Q8H PRN Patrecia Pour, NP      . pantoprazole (PROTONIX) EC tablet 40 mg  40 mg Oral Daily Patrecia Pour, NP   40 mg at 12/28/16 0801  . polyethylene glycol (MIRALAX / GLYCOLAX) packet 17 g  17 g Oral Daily Patrecia Pour, NP   17 g at 12/24/16 0818  . propranolol (INDERAL) tablet 20 mg  20 mg Oral BID Patrecia Pour, NP   20 mg at 12/28/16 0801  . simvastatin (ZOCOR) tablet 20 mg  20 mg Oral q1800 Cobos, Myer Peer, MD   20 mg at 12/27/16 1725  . traZODone (DESYREL) tablet 50 mg  50 mg Oral QHS PRN Cobos, Myer Peer, MD   50 mg at 12/23/16 2235    PTA Medications: Prescriptions Prior to Admission  Medication Sig Dispense Refill Last Dose  . cloZAPine (CLOZARIL) 100 MG tablet Take 1 tablet (100 mg total) by mouth at bedtime. 30 tablet 1 Past Week at Unknown time  . fluvoxaMINE (LUVOX) 100 MG tablet Take 1 tablet (100 mg total) by mouth at bedtime. 30 tablet 1 Past Week at Unknown time  . insulin aspart (NOVOLOG) 100 UNIT/ML injection  Inject 10 Units into the skin 3 (three) times daily with meals. 10 mL 11 Past Week at Unknown time  . insulin glargine (LANTUS) 100 UNIT/ML injection Inject 0.55 mLs (55 Units total) into the skin at bedtime. 10 mL 11 Past Week at Unknown time  . lisinopril (PRINIVIL,ZESTRIL) 20 MG tablet Take 1 tablet (20 mg total) by mouth daily. 30 tablet 1 Past Week at Unknown time  . meloxicam (MOBIC) 7.5 MG tablet Take 1 tablet (7.5 mg total) by mouth 2 (two) times daily after a meal. 60 tablet 1 Past Week at Unknown time  . metFORMIN (GLUCOPHAGE) 1000 MG tablet Take 1 tablet (1,000 mg total) by mouth 2 (two) times daily with a meal. 60 tablet 1 Past Week at Unknown time  . montelukast (SINGULAIR) 10 MG tablet Take 1 tablet (10 mg total) by mouth at bedtime. 30 tablet 1 Past Week at Unknown time  . nicotine (NICODERM CQ - DOSED IN MG/24 HR) 7 mg/24hr patch Place 1 patch (7 mg total) onto the skin daily. 28 patch 0 Past Week  at Unknown time  . pantoprazole (PROTONIX) 40 MG tablet Take 1 tablet (40 mg total) by mouth daily. 30 tablet 1 Past Week at Unknown time  . polyethylene glycol (MIRALAX / GLYCOLAX) packet Take 17 g by mouth daily. 14 each 1 Past Week at Unknown time  . propranolol (INDERAL) 20 MG tablet Take 1 tablet (20 mg total) by mouth 2 (two) times daily. 60 tablet 1 Past Week at Unknown time  . simvastatin (ZOCOR) 40 MG tablet Take 1 tablet (40 mg total) by mouth daily at 6 PM. 30 tablet 1 Past Week at Unknown time  . temazepam (RESTORIL) 15 MG capsule Take 1 capsule (15 mg total) by mouth at bedtime. 30 capsule 1 Past Week at Unknown time    Patient Stressors: Financial difficulties Medication change or noncompliance  Patient Strengths: Network engineer for treatment/growth Supportive family/friends  Treatment Modalities: Medication Management, Group therapy, Case management,  1 to 1 session with clinician, Psychoeducation, Recreational therapy.   Physician Treatment Plan for Primary Diagnosis: Schizoaffective Disorder, Bipolar Type Long Term Goal(s): Improvement in symptoms so as ready for discharge  Short Term Goals: Ability to identify changes in lifestyle to reduce recurrence of condition will improve Ability to verbalize feelings will improve Ability to disclose and discuss suicidal ideas Ability to demonstrate self-control will improve Ability to identify and develop effective coping behaviors will improve Compliance with prescribed medications will improve  Medication Management: Evaluate patient's response, side effects, and tolerance of medication regimen.  Therapeutic Interventions: 1 to 1 sessions, Unit Group sessions and Medication administration.  Evaluation of Outcomes: Progressing   8/6: Anxiety/HTN: Continue Propranolol 20 mg daily.  Mood Control/Stabilization:  Continue Clozapine (Clozaril) from 153m Q  hs.  Depression: Continue Fluvoxamine (Luvox) 100 mg Q hs.  Insomnia: Continue Trazodone  50 mg prn at bedtime. Physician Treatment Plan for Secondary Diagnosis: Principal Problem:   Schizoaffective disorder, bipolar type ([place]   Long Term Goal(s): Improvement in symptoms so as ready for discharge  Short Term Goals: Ability to identify changes in lifestyle to reduce recurrence of condition will improve Ability to verbalize feelings will improve Ability to disclose and discuss suicidal ideas Ability to demonstrate self-control will improve Ability to identify and develop effective coping behaviors will improve Compliance with prescribed medications will improve  Medication Management: Evaluate patient's response, side effects, and tolerance of medication regimen.  Therapeutic Interventions: 1  to 1 sessions, Unit Group sessions and Medication administration.  Evaluation of Outcomes: Progressing   RN Treatment Plan for Primary Diagnosis: Schizoaffective Disorder, Bipolar Type Long Term Goal(s): Knowledge of disease and therapeutic regimen to maintain health will improve  Short Term Goals: Ability to identify and develop effective coping behaviors will improve and Compliance with prescribed medications will improve  Medication Management: RN will administer medications as ordered by provider, will assess and evaluate patient's response and provide education to patient for prescribed medication. RN will report any adverse and/or side effects to prescribing provider.  Therapeutic Interventions: 1 on 1 counseling sessions, Psychoeducation, Medication administration, Evaluate responses to treatment, Monitor vital signs and CBGs as ordered, Perform/monitor CIWA, COWS, AIMS and Fall Risk screenings as ordered, Perform wound care treatments as ordered.  Evaluation of Outcomes: Progressing   LCSW Treatment Plan for Primary Diagnosis: Schizoaffective Disorder, Bipolar Type Long Term  Goal(s): Safe transition to appropriate next level of care at discharge, Engage patient in therapeutic group addressing interpersonal concerns.  Short Term Goals: Engage patient in aftercare planning with referrals and resources  Therapeutic Interventions: Assess for all discharge needs, 1 to 1 time with Social worker, Explore available resources and support systems, Assess for adequacy in community support network, Educate family and significant other(s) on suicide prevention, Complete Psychosocial Assessment, Interpersonal group therapy.  Evaluation of Outcomes: Met  Return home, follow up outpt   Progress in Treatment: Attending groups: Intermittently Participating in groups: Minimally Taking medication as prescribed: Yes Toleration medication: Yes, no side effects reported at this time Family/Significant other contact made: No Patient understands diagnosis: Yes AEB asking for help with suicidal thoughts Discussing patient identified problems/goals with staff: Yes Medical problems stabilized or resolved: Yes Denies suicidal/homicidal ideation: Yes Issues/concerns per patient self-inventory: None Other: [name]  New problem(s) identified: None identified at this time.   New Short Term/Long Term Goal(s): None identified at this time.   Discharge Plan or Barriers: return home w family and follow up outpatient, consider group home placement  Reason for Continuation of Hospitalization:   Depression Medication stabilization Suicidal ideation   Estimated Length of Stay: 01/01/17  Attendees: Patient: 12/28/2016  12:59 PM  Physician: Ursula Alert, MD 12/28/2016  12:59 PM  Nursing: Sena Hitch, RN 12/28/2016  12:59 PM  RN Care Manager: Lars Pinks, RN 12/28/2016  12:59 PM  Social Worker: Ripley Fraise 12/28/2016  12:59 PM  Recreational Therapist: Winfield Cunas 12/28/2016  12:59 PM  Other: Norberto Sorenson 12/28/2016  12:59 PM  Other:  12/28/2016  12:59 PM    Scribe for Treatment  Team:  Roque Lias LCSW 12/28/2016 12:59 PM

## 2016-12-28 NOTE — [facility] Group Notes (Signed)
[facility] [employer] Group Therapy  12/28/2016 , 1:04 PM    Type of Therapy:  [employer] Presentation  Participation Level:  Active  Participation Quality:  Attentive  Affect:  Blunted  Cognitive:  Oriented  Insight:  Limited  Engagement in Therapy:  Engaged  Modes of Intervention:  Discussion, Education and Socialization  Summary of Progress/Problems:  Onalee Hua from [employer] came to present his recovery story and play the guitar.  Invited.  In bed, awake.  "I'm really tired."  Did not attend  [name] 12/28/2016 , 1:04 PM

## 2016-12-28 NOTE — Progress Notes (Signed)
Adult Psychoeducational Group Note  Date:  12/28/2016 Time:  8:47 PM  Group Topic/Focus:  Wrap-Up Group:   The focus of this group is to help patients review their daily goal of treatment and discuss progress on daily workbooks.  Participation Level:  Active  Participation Quality:  Appropriate  Affect:  Appropriate  Cognitive:  Appropriate  Insight: Appropriate  Engagement in Group:  Engaged  Modes of Intervention:  Discussion  Additional Comments:  The patient expressed that he attended groups.The patient also said that rates today much better.  Tony Cunningham 12/28/2016, 8:47 PM

## 2016-12-28 NOTE — Progress Notes (Signed)
Nursing Note 12/28/2016 [id number]  Data Reports sleeping good without PRN sleep med.  Rates depression 2/10, hopelessness 2/10, and anxiety 7/10. Affect flat.  Denies HI, SI, AVH.  Patient somnolent this AM.  Prompted multiple times to take shower this AM, very concrete. Patient needed specific step-by-step directions to take shower, and needed step-by-step verbal prompting to get dressed in clean clothes.  Odor has decreased, sheets have been changed.  Slept in this AM, refused to leave room.  Patient got up at 1400 and said "I thought it was 11 o'clock."  Out in dayroom before dinner watching TV.  Action Spoke with patient 1:1, nurse offered support to patient throughout shift.  Continues to be monitored on 15 minute checks for safety.  Response Remains safe on unit, bathed today, going to meals.

## 2016-12-28 NOTE — Progress Notes (Signed)
Patient reported not feeling good. He said he has been having rasing thoughts. Although denied SI/HI and denied Hallucinations. He appeared sad and depressed. Writer encouraged and supported patient. Q 15 minute check continues as ordered for safety.

## 2016-12-29 LAB — GLUCOSE, CAPILLARY
GLUCOSE-CAPILLARY: 104 mg/dL — AB (ref 65–99)
GLUCOSE-CAPILLARY: 54 mg/dL — AB (ref 65–99)
GLUCOSE-CAPILLARY: 348 mg/dL — AB (ref 65–99)
Glucose-Capillary: 149 mg/dL — ABNORMAL HIGH (ref 65–99)
Glucose-Capillary: 212 mg/dL — ABNORMAL HIGH (ref 65–99)

## 2016-12-29 LAB — VITAMIN D 25 HYDROXY (VIT D DEFICIENCY, FRACTURES): VIT D 25 HYDROXY: 24.5 ng/mL — AB (ref 30.0–100.0)

## 2016-12-29 MED ORDER — IBUPROFEN 600 MG PO TABS
600.0000 mg | ORAL_TABLET | Freq: Three times a day (TID) | ORAL | Status: DC | PRN
Start: 2016-12-29 — End: 2017-01-06
  Administered 2017-01-05: 600 mg via ORAL
  Filled 2016-12-29: qty 1

## 2016-12-29 MED ORDER — INSULIN ASPART 100 UNIT/ML ~~LOC~~ SOLN
4.0000 [IU] | Freq: Three times a day (TID) | SUBCUTANEOUS | Status: DC
  Administered 2016-12-29 – 2017-01-06 (×23): 4 [IU] via SUBCUTANEOUS

## 2016-12-29 MED ORDER — TRAZODONE HCL 100 MG PO TABS
100.0000 mg | ORAL_TABLET | Freq: Every day | ORAL | Status: DC
  Administered 2016-12-29 – 2017-01-01 (×4): 100 mg via ORAL
  Filled 2016-12-29 (×6): qty 1

## 2016-12-29 NOTE — [facility] Group Notes (Signed)
[facility] LCSW Group Therapy  12/29/2016 1:15 pm  Type of Therapy: Process Group Therapy  Participation Level:  Active  Participation Quality:  Appropriate  Affect:  Flat  Cognitive:  Oriented  Insight:  Improving  Engagement in Group:  Limited  Engagement in Therapy:  Limited  Modes of Intervention:  Activity, Clarification, Education, Problem-solving and Support  Summary of Progress/Problems: Today's group addressed the issue of overcoming obstacles.  Patients were asked to identify their biggest obstacle post d/c that stands in the way of their on-going success, and then problem solve as to how to manage this. Invited.  Chose to not attend.  [name] 12/29/2016   1:47 PM

## 2016-12-29 NOTE — Progress Notes (Signed)
Nursing Progress Note [id number]  D) Patient presents with flat affect. Patient was medication compliant with scheduled medications this evening. CBG elevated, patient medicated as prescribed. Patient is seen up in the milieu. Patient denies SI/HI/AVH or pain. Patient contracts for safety on the unit.  A) Emotional support given. 1:1 interaction and active listening provided. Patient medicated as prescribed. Medications and plan of care reviewed with patient. Patient verbalized understanding without further questions. Snacks and fluids provided. Opportunities for questions or concerns presented to patient. Patient encouraged to continue to work on treatment goals. Labs, vital signs and patient behavior monitored throughout shift. Patient safety maintained with q15 min safety checks. Low fall risk precautions in place and reviewed with patient; patient verbalized understanding.  R) Patient receptive to interaction with nurse. Patient remains safe on the unit at this time. Patient denies any adverse medication reactions at this time. Patient is resting in bed without complaints. Will continue to monitor.

## 2016-12-29 NOTE — Progress Notes (Signed)
Recreation Therapy Notes  Date: 12/29/16 Time: 1000 Location: 500 Hall Dayroom  Group Topic: Communication  Goal Area(s) Addresses:  Patient will effectively communicate with peers in group.  Patient will verbalize benefit of healthy communication. Patient will verbalize positive effect of healthy communication on post d/c goals.  Patient will identify communication techniques that made activity effective for group.   Intervention:  Geometric images, blank paper, pencils   Activity: Back-to-Back Drawing.  Patients were placed in groups of two.  Each group was seated back to back.  One person was designated the speaker and the other person was the listener.  The speaker described the geometrical image they were given to the listener.  The listener, in turn, drew the image that was described to them by the speaker.    Education: Communication, Discharge Planning  Education Outcome: Acknowledges understanding/In group clarification offered/Needs additional education.   Clinical Observations/Feedback: Pt did not attend group.    [name], LRT/CTRS         [name], Cordell Coke A 12/29/2016 11:50 AM

## 2016-12-29 NOTE — Progress Notes (Signed)
Hypoglycemic Event  CBG: 54  Treatment: 15 GM carbohydrate snack  Symptoms: None  Follow-up CBG: Time:12:18 CBG Result:104  Possible Reasons for Event: Unknown  Comments/MD notified: Dr. Elna Breslow notified.    Tony Cunningham

## 2016-12-29 NOTE — Progress Notes (Signed)
Inpatient Diabetes Program Recommendations  AACE/ADA: New Consensus Statement on Inpatient Glycemic Control (2015)  Target Ranges:  Prepandial:   less than 140 mg/dL      Peak postprandial:   less than 180 mg/dL (1-2 hours)      Critically ill patients:  140 - 180 mg/dL   Lab Results  Component Value Date   GLUCAP 104 (H) 12/29/2016   HGBA1C 10.3 (H) 11/09/2016    Review of Glycemic Control  Diabetes history: DM2 Outpatient Diabetes medications: Lantus 55 units QHS, Novolog 10 units tidwc, metformin 1000 mg bid Current orders for Inpatient glycemic control:  Llantus 45 units QHS, Novolog 0-15 units tidwc and hs +6 units tidwc  Had hypoglycemia this am. HgbA1C 10.3%. Improved from 11.2%.  Inpatient Diabetes Program Recommendations:    Decrease Novolog to 4 units tidwc  Will continue to follow.  Thank you. Ailene Ards, RD, LDN, CDE Inpatient Diabetes Coordinator [phone]

## 2016-12-29 NOTE — Progress Notes (Signed)
D: Pt A & O to self and place. Denies SI, HI, AVH and pain when assessed "no". Presented fidgety with flat affect and tremulous this shift.  Per pt "I'm fine and just want to sleep". Pt cooperative with CBG checks when approach. Pt did attend CSW group this shift when prompted by staff.  A: Medcations administered as prescribed and effects monitored. Q 15 minutes safety checks continues without outburst or self harm gestures to note at this time. Encouraged pt to voice concerns, attend scheduled unit activities. R: Pt remains compliant with medications when offered. Denies adverse drug reactions when assessed. POC remains effective for mood stability and safety.

## 2016-12-29 NOTE — Progress Notes (Signed)
[facility] MD Progress Note  12/29/2016 3:07 PM Jadon Harbaugh  MRN:  [id number] Subjective: Pt states " I did not sleep .'    Objective:Patient seen and chart reviewed.Discussed patient with treatment team.  Pt seen as guarded , very minimal interactions today. Pt refused to answer most questions asked, stated he did not sleep. Denied other concerns. Per RN pt was intrusive , not sleeping and taking stuff that belonged to his room mate . Pt requires redirection on the unit .     Principal Problem: Schizoaffective disorder, bipolar type (HCC) Diagnosis:   Patient Active Problem List   Diagnosis Date Noted  . 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 Schizoaffective disorder, bipolar type (HCC) [F25.0] 12/22/2016  . Schizoaffective disorder, depressive type (HCC) [F25.1] 09/23/2016  . Polysubstance abuse [F19.10] 07/17/2016  . Tardive dyskinesia [G24.01] 07/16/2016  . Tobacco use disorder [F17.200] 07/07/2016  . Dyslipidemia [E78.5] 07/07/2016  . Asthma [J45.909] 07/07/2016  . HTN (hypertension) [I10] 07/06/2016  . Diabetes (HCC) [E11.9] 12/25/2010   Total Time spent with patient: 20 minutes  Past Psychiatric History: Please see H&P.   Past Medical History:  Past Medical History:  Diagnosis Date  . Anxiety   . Asthma   . Diabetes mellitus   . High blood pressure   . Sinus complaint    History reviewed. No pertinent surgical history. Family History: Please see H&P.  Family Psychiatric  History:Please see H&P.  Social History:  History  Alcohol Use No     History  Drug Use No    Social History   Social History  . Marital status: Single    Spouse name: [name]  . Number of children: [name]  . Years of education: [name]   Social History Main Topics  . Smoking status: Current Every Day Smoker    Packs/day: 0.50    Types: Cigarettes  . Smokeless tobacco: Never Used  . Alcohol use No  . Drug use: No  . Sexual activity: Not Currently   Other Topics Concern   . None   Social History Narrative  . None   Additional Social History:                         Sleep: Poor  Appetite:  Fair  Current Medications: Current Facility-Administered Medications  Medication Dose Route Frequency Provider Last Rate Last Dose  . acetaminophen (TYLENOL) tablet 650 mg  650 mg Oral Q6H PRN Charm Rings, NP      . alum & mag hydroxide-simeth (MAALOX/MYLANTA) 200-200-20 MG/5ML suspension 30 mL  30 mL Oral Q6H PRN Charm Rings, NP      . amantadine (SYMMETREL) capsule 100 mg  100 mg Oral BID Jomarie Longs, MD   100 mg at 12/29/16 7829  . cloZAPine (CLOZARIL) tablet 150 mg  150 mg Oral QHS Oneta Rack, NP   150 mg at 12/28/16 2115  . fluvoxaMINE (LUVOX) tablet 100 mg  100 mg Oral QHS Charm Rings, NP   100 mg at 12/28/16 2115  . ibuprofen (ADVIL,MOTRIN) tablet 600 mg  600 mg Oral Q8H PRN Nelly Rout, MD      . insulin aspart (novoLOG) injection 0-15 Units  0-15 Units Subcutaneous TID WC Charm Rings, NP   2 Units at 12/29/16 [phone]  . insulin aspart (novoLOG) injection 0-5 Units  0-5 Units Subcutaneous QHS Charm Rings, NP   2 Units at 12/28/16 2137  . insulin aspart (novoLOG) injection  4 Units  4 Units Subcutaneous TID WC Siyona Coto, MD      . insulin glargine (LANTUS) injection 45 Units  45 Units Subcutaneous QHS Armandina Stammer I, NP   45 Units at 12/28/16 2136  . lisinopril (PRINIVIL,ZESTRIL) tablet 20 mg  20 mg Oral Daily Charm Rings, NP   20 mg at 12/29/16 5883  . magnesium hydroxide (MILK OF MAGNESIA) suspension 30 mL  30 mL Oral Daily PRN Charm Rings, NP      . metFORMIN (GLUCOPHAGE) tablet 1,000 mg  1,000 mg Oral BID WC Charm Rings, NP   1,000 mg at 12/29/16 2549  . montelukast (SINGULAIR) tablet 10 mg  10 mg Oral QHS Charm Rings, NP   10 mg at 12/28/16 2136  . nicotine polacrilex (NICORETTE) gum 2 mg  2 mg Oral PRN Jomarie Longs, MD      . ondansetron (ZOFRAN) tablet 4 mg  4 mg Oral Q8H PRN Charm Rings,  NP      . pantoprazole (PROTONIX) EC tablet 40 mg  40 mg Oral Daily Charm Rings, NP   40 mg at 12/29/16 8264  . polyethylene glycol (MIRALAX / GLYCOLAX) packet 17 g  17 g Oral Daily Charm Rings, NP   17 g at 12/29/16 0820  . propranolol (INDERAL) tablet 20 mg  20 mg Oral BID Charm Rings, NP   20 mg at 12/29/16 1583  . simvastatin (ZOCOR) tablet 20 mg  20 mg Oral q1800 Cobos, Rockey Situ, MD   20 mg at 12/28/16 1705  . traZODone (DESYREL) tablet 100 mg  100 mg Oral QHS Jomarie Longs, MD        Lab Results:  Results for orders placed or performed during the hospital encounter of 12/22/16 (from the past 48 hour(s))  Glucose, capillary     Status: Abnormal   Collection Time: 12/27/16  4:43 PM  Result Value Ref Range   Glucose-Capillary 59 (L) 65 - 99 mg/dL  Glucose, capillary     Status: Abnormal   Collection Time: 12/27/16  5:10 PM  Result Value Ref Range   Glucose-Capillary 126 (H) 65 - 99 mg/dL  Glucose, capillary     Status: Abnormal   Collection Time: 12/27/16  7:26 PM  Result Value Ref Range   Glucose-Capillary 347 (H) 65 - 99 mg/dL  Glucose, capillary     Status: Abnormal   Collection Time: 12/28/16  5:56 AM  Result Value Ref Range   Glucose-Capillary 150 (H) 65 - 99 mg/dL  TSH     Status: None   Collection Time: 12/28/16  6:28 AM  Result Value Ref Range   TSH 2.629 0.350 - 4.500 uIU/mL    Comment: Performed by a 3rd Generation assay with a functional sensitivity of <=0.01 uIU/mL. Performed at [facility], [address] [place], [place] [address]   Lipid panel     Status: None   Collection Time: 12/28/16  6:28 AM  Result Value Ref Range   Cholesterol 129 0 - 200 mg/dL   Triglycerides 78 <680 mg/dL   HDL 54 >88 mg/dL   Total CHOL/HDL Ratio 2.4 RATIO   VLDL 16 0 - 40 mg/dL   LDL Cholesterol 59 0 - 99 mg/dL    Comment:        Total Cholesterol/HDL:CHD Risk Coronary Heart Disease Risk Table  Men   Women  1/2 Average  Risk   3.4   3.3  Average Risk       5.0   4.4  2 X Average Risk   9.6   7.1  3 X Average Risk  23.4   11.0        Use the calculated Patient Ratio above and the CHD Risk Table to determine the patient's CHD Risk.        ATP III CLASSIFICATION (LDL):  <100     mg/dL   Optimal  086-761  mg/dL   Near or Above                    Optimal  130-159  mg/dL   Borderline  950-932  mg/dL   High  >671     mg/dL   Very High Performed at [facility], [address] [place], [place] [address]   VITAMIN D 25 Hydroxy (Vit-D Deficiency, Fractures)     Status: Abnormal   Collection Time: 12/28/16  6:28 AM  Result Value Ref Range   Vit D, 25-Hydroxy 24.5 (L) 30.0 - 100.0 ng/mL    Comment: (NOTE) Vitamin D deficiency has been defined by the Institute of Medicine and an Endocrine Society practice guideline as a level of serum 25-OH vitamin D less than 20 ng/mL (1,2). The Endocrine Society went on to further define vitamin D insufficiency as a level between 21 and 29 ng/mL (2). 1. IOM (Institute of Medicine). 2010. Dietary reference   intakes for calcium and D. [place]: The   [employer]. 2. Holick MF, Binkley Bourg, Bischoff-Ferrari HA, et al.   Evaluation, treatment, and prevention of vitamin D   deficiency: an Endocrine Society clinical practice   guideline. JCEM. 2011 Jul; 96(7):1911-30. Performed At: [facility] [address] [place], [place] [id number] Mila Homer MD [phone] Performed at [facility], [address], [place], [place] [address]   Vitamin B12     Status: None   Collection Time: 12/28/16  6:28 AM  Result Value Ref Range   Vitamin B-12 457 180 - 914 pg/mL    Comment: (NOTE) This assay is not validated for testing neonatal or myeloproliferative syndrome specimens for Vitamin B12 levels. Performed at [facility], [address] [place], [place] [address]   Glucose, capillary     Status: Abnormal    Collection Time: 12/28/16 11:27 AM  Result Value Ref Range   Glucose-Capillary 188 (H) 65 - 99 mg/dL   Comment 1 Notify RN    Comment 2 Document in Chart   Glucose, capillary     Status: Abnormal   Collection Time: 12/28/16  5:11 PM  Result Value Ref Range   Glucose-Capillary 271 (H) 65 - 99 mg/dL  Glucose, capillary     Status: Abnormal   Collection Time: 12/28/16  8:47 PM  Result Value Ref Range   Glucose-Capillary 220 (H) 65 - 99 mg/dL   Comment 1 Notify RN   Glucose, capillary     Status: Abnormal   Collection Time: 12/29/16  6:08 AM  Result Value Ref Range   Glucose-Capillary 149 (H) 65 - 99 mg/dL   Comment 1 Notify RN   Glucose, capillary     Status: Abnormal   Collection Time: 12/29/16 11:59 AM  Result Value Ref Range   Glucose-Capillary 54 (L) 65 - 99 mg/dL  Glucose, capillary     Status: Abnormal  Collection Time: 12/29/16 12:18 PM  Result Value Ref Range   Glucose-Capillary 104 (H) 65 - 99 mg/dL    Blood Alcohol level:  Lab Results  Component Value Date   ETH <5 12/21/2016   ETH <5 10/21/2016    Metabolic Disorder Labs: Lab Results  Component Value Date   HGBA1C 10.3 (H) 11/09/2016   MPG 249 11/09/2016   MPG 275 10/09/2016   Lab Results  Component Value Date   PROLACTIN 24.5 (H) 09/24/2016   PROLACTIN 3.4 (L) 07/07/2016   Lab Results  Component Value Date   CHOL 129 12/28/2016   TRIG 78 12/28/2016   HDL 54 12/28/2016   CHOLHDL 2.4 12/28/2016   VLDL 16 12/28/2016   LDLCALC 59 12/28/2016   LDLCALC 42 11/09/2016    Physical Findings: AIMS: Facial and Oral Movements Muscles of Facial Expression: None, normal Lips and Perioral Area: None, normal Jaw: None, normal Tongue: None, normal,Extremity Movements Upper (arms, wrists, hands, fingers): Mild Lower (legs, knees, ankles, toes): None, normal, Trunk Movements Neck, shoulders, hips: None, normal, Overall Severity Severity of abnormal movements (highest score from questions above):  Mild Incapacitation due to abnormal movements: None, normal Patient's awareness of abnormal movements (rate only patient's report): Aware, no distress, Dental Status Current problems with teeth and/or dentures?: No Does patient usually wear dentures?: No  CIWA:  CIWA-Ar Total: 4 COWS:  COWS Total Score: 4  Musculoskeletal: Strength & Muscle Tone: within normal limits Gait & Station: normal Patient leans: [name]  Psychiatric Specialty Exam: Physical Exam  Nursing note and vitals reviewed.   Review of Systems  Psychiatric/Behavioral: The patient has insomnia.   All other systems reviewed and are negative.   Blood pressure 105/71, pulse 93, temperature 98.8 F (37.1 C), temperature source Oral, resp. rate 17, height 6' (1.829 m), weight 81.4 kg (179 lb 8 oz), SpO2 100 %.Body mass index is 24.34 kg/m.  General Appearance: Guarded  Eye Contact:  Minimal  Speech:  Slow  Volume:  Decreased  Mood:  Dysphoric  Affect:  Constricted  Thought Process:  Linear and Descriptions of Associations: Circumstantial  Orientation:  Other:  place, self  Thought Content:  Paranoid Ideation and Rumination  Suicidal Thoughts:  No  Homicidal Thoughts:  No  Memory:  Immediate;   Fair Recent;   Fair Remote;   Fair  Judgement:  Impaired  Insight:  Fair  Psychomotor Activity:  Restlessness and Tremor  Concentration:  Concentration: Fair and Attention Span: Fair  Recall:  [employer] of Knowledge:  Fair  Language:  Fair  Akathisia:  No  Handed:  Right  AIMS (if indicated):   tremors positive   Assets:  Desire for Improvement Resilience Social Support  ADL's:  Intact  Cognition:  WNL  Sleep:  Number of Hours: 1   Treatment Plan Summary:Patient with schizoaffective do , past hx of ECT , recent decompensation , seen as having sleep issues , will continue to need medication readjustment.   Will continue today 12/29/16 plan as below except where it is noted.  Daily contact with patient to assess  and evaluate symptoms and progress in treatment and Medication management    Reviewed past medical records,treatment plan.  Clozaril 150 mg po qhs for psychosis. Clozaril level pending. Increase Trazodone to 100 mg po qhs for sleep. Continue Amantadine 100 mg po bid for neuroleptic induced tremors. Will continue Luvox 100 mg po at qhs for affective sx. Propranolol 20 mg po bid for tremors, anxiety. Restarted home  medications as indicated. Will continue to monitor vitals ,medication compliance and treatment side effects while patient is here.  Will monitor for medical issues as well as call consult as needed.  Reviewed labs tsh -wnl, vitamin b12 - wnl. CSW will continue working on disposition.  Patient to participate in therapeutic milieu .       Emilina Smarr, MD 12/29/2016, 3:07 PM

## 2016-12-29 NOTE — Plan of Care (Signed)
Problem: Health Behavior/Discharge Planning: Goal: Compliance with treatment plan for underlying cause of condition will improve Outcome: Progressing Patient compliant with scheduled medications and attending group.

## 2016-12-30 LAB — GLUCOSE, CAPILLARY
GLUCOSE-CAPILLARY: 250 mg/dL — AB (ref 65–99)
GLUCOSE-CAPILLARY: 314 mg/dL — AB (ref 65–99)
Glucose-Capillary: 135 mg/dL — ABNORMAL HIGH (ref 65–99)
Glucose-Capillary: 205 mg/dL — ABNORMAL HIGH (ref 65–99)

## 2016-12-30 MED ORDER — FLUVOXAMINE MALEATE 50 MG PO TABS
125.0000 mg | ORAL_TABLET | Freq: Every day | ORAL | Status: DC
  Administered 2016-12-30 – 2017-01-01 (×3): 125 mg via ORAL
  Filled 2016-12-30 (×5): qty 1

## 2016-12-30 MED ORDER — BENZTROPINE MESYLATE 0.5 MG PO TABS
0.5000 mg | ORAL_TABLET | Freq: Two times a day (BID) | ORAL | Status: DC
  Administered 2016-12-31 – 2017-01-02 (×5): 0.5 mg via ORAL
  Filled 2016-12-30 (×10): qty 1

## 2016-12-30 MED ORDER — FLUVOXAMINE MALEATE 50 MG PO TABS
ORAL_TABLET | ORAL | Status: AC
  Filled 2016-12-30: qty 3

## 2016-12-30 NOTE — Progress Notes (Signed)
Patient ID: Tony Cunningham, male   DOB: 21-Aug-1975, 41 y.o.   MRN: [id number]  D: Patient denies SI/HI . No signs of distress. Limited interaction. Disheveled.Poor hygiene.  A: Patient given emotional support from RN. Patient given medications per MD orders. Patient encouraged to attend groups and unit activities. Patient encouraged to come to staff with any questions or concerns. Encouraged to bathe.  R: Patient remains cooperative and appropriate. Will continue to monitor patient for safety.

## 2016-12-30 NOTE — Progress Notes (Signed)
[facility] MD Progress Note  12/30/2016 5:35 PM Tony Cunningham  MRN:  [id number] Subjective: Pt states " I am ok.'    Objective:Patient seen and chart reviewed.Discussed patient with treatment team.  Pt today seen as withdrawn , disheveled . Pt with limited interaction, needs encouragement to come out of his room . Pt with tremors as well as cogwheeling pos. Likely due to psychotropics. Will not make any changes with his clozaril today. Will increase Luvox today to target his affective sx. Discussed with RN to support him to take care of his ADLs . Continue to observe .      Principal Problem: Schizoaffective disorder, bipolar type (HCC) Diagnosis:   Patient Active Problem List   Diagnosis Date Noted  . 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 Schizoaffective disorder, bipolar type (HCC) [F25.0] 12/22/2016  . Schizoaffective disorder, depressive type (HCC) [F25.1] 09/23/2016  . Polysubstance abuse [F19.10] 07/17/2016  . Tardive dyskinesia [G24.01] 07/16/2016  . Tobacco use disorder [F17.200] 07/07/2016  . Dyslipidemia [E78.5] 07/07/2016  . Asthma [J45.909] 07/07/2016  . HTN (hypertension) [I10] 07/06/2016  . Diabetes (HCC) [E11.9] 12/25/2010   Total Time spent with patient: 25 minutes  Past Psychiatric History: Please see H&P.   Past Medical History:  Past Medical History:  Diagnosis Date  . Anxiety   . Asthma   . Diabetes mellitus   . High blood pressure   . Sinus complaint    History reviewed. No pertinent surgical history. Family History: Please see H&P.  Family Psychiatric  History:Please see H&P.  Social History:  History  Alcohol Use No     History  Drug Use No    Social History   Social History  . Marital status: Single    Spouse name: [name]  . Number of children: [name]  . Years of education: [name]   Social History Main Topics  . Smoking status: Current Every Day Smoker    Packs/day: 0.50    Types: Cigarettes  . Smokeless tobacco: Never  Used  . Alcohol use No  . Drug use: No  . Sexual activity: Not Currently   Other Topics Concern  . None   Social History Narrative  . None   Additional Social History:                         Sleep: Fair  Appetite:  Fair  Current Medications: Current Facility-Administered Medications  Medication Dose Route Frequency Provider Last Rate Last Dose  . acetaminophen (TYLENOL) tablet 650 mg  650 mg Oral Q6H PRN Charm Rings, NP      . alum & mag hydroxide-simeth (MAALOX/MYLANTA) 200-200-20 MG/5ML suspension 30 mL  30 mL Oral Q6H PRN Charm Rings, NP      . amantadine (SYMMETREL) capsule 100 mg  100 mg Oral BID Jomarie Longs, MD   100 mg at 12/30/16 1700  . cloZAPine (CLOZARIL) tablet 150 mg  150 mg Oral QHS Oneta Rack, NP   150 mg at 12/29/16 2133  . fluvoxaMINE (LUVOX) tablet 125 mg  125 mg Oral QHS Josearmando Kuhnert, MD      . ibuprofen (ADVIL,MOTRIN) tablet 600 mg  600 mg Oral Q8H PRN Nelly Rout, MD      . insulin aspart (novoLOG) injection 0-15 Units  0-15 Units Subcutaneous TID WC Charm Rings, NP   11 Units at 12/30/16 1700  . insulin aspart (novoLOG) injection 0-5 Units  0-5 Units Subcutaneous QHS Charm Rings,  NP   4 Units at 12/29/16 2135  . insulin aspart (novoLOG) injection 4 Units  4 Units Subcutaneous TID WC Jomarie Longs, MD   4 Units at 12/30/16 1700  . insulin glargine (LANTUS) injection 45 Units  45 Units Subcutaneous QHS Armandina Stammer I, NP   45 Units at 12/29/16 2134  . lisinopril (PRINIVIL,ZESTRIL) tablet 20 mg  20 mg Oral Daily Charm Rings, NP   20 mg at 12/30/16 0854  . magnesium hydroxide (MILK OF MAGNESIA) suspension 30 mL  30 mL Oral Daily PRN Charm Rings, NP      . metFORMIN (GLUCOPHAGE) tablet 1,000 mg  1,000 mg Oral BID WC Charm Rings, NP   1,000 mg at 12/30/16 1700  . montelukast (SINGULAIR) tablet 10 mg  10 mg Oral QHS Charm Rings, NP   10 mg at 12/29/16 2133  . nicotine polacrilex (NICORETTE) gum 2 mg  2 mg  Oral PRN Brentlee Delage, Levin Bacon, MD      . ondansetron (ZOFRAN) tablet 4 mg  4 mg Oral Q8H PRN Charm Rings, NP      . pantoprazole (PROTONIX) EC tablet 40 mg  40 mg Oral Daily Charm Rings, NP   40 mg at 12/30/16 0854  . polyethylene glycol (MIRALAX / GLYCOLAX) packet 17 g  17 g Oral Daily Charm Rings, NP   17 g at 12/29/16 0820  . propranolol (INDERAL) tablet 20 mg  20 mg Oral BID Charm Rings, NP   20 mg at 12/30/16 1700  . simvastatin (ZOCOR) tablet 20 mg  20 mg Oral q1800 Cobos, Rockey Situ, MD   20 mg at 12/30/16 1800  . traZODone (DESYREL) tablet 100 mg  100 mg Oral QHS Katia Hannen, Levin Bacon, MD   100 mg at 12/29/16 2132    Lab Results:  Results for orders placed or performed during the hospital encounter of 12/22/16 (from the past 48 hour(s))  Glucose, capillary     Status: Abnormal   Collection Time: 12/28/16  8:47 PM  Result Value Ref Range   Glucose-Capillary 220 (H) 65 - 99 mg/dL   Comment 1 Notify RN   Glucose, capillary     Status: Abnormal   Collection Time: 12/29/16  6:08 AM  Result Value Ref Range   Glucose-Capillary 149 (H) 65 - 99 mg/dL   Comment 1 Notify RN   Glucose, capillary     Status: Abnormal   Collection Time: 12/29/16 11:59 AM  Result Value Ref Range   Glucose-Capillary 54 (L) 65 - 99 mg/dL  Glucose, capillary     Status: Abnormal   Collection Time: 12/29/16 12:18 PM  Result Value Ref Range   Glucose-Capillary 104 (H) 65 - 99 mg/dL  Glucose, capillary     Status: Abnormal   Collection Time: 12/29/16  5:08 PM  Result Value Ref Range   Glucose-Capillary 212 (H) 65 - 99 mg/dL  Glucose, capillary     Status: Abnormal   Collection Time: 12/29/16  8:24 PM  Result Value Ref Range   Glucose-Capillary 348 (H) 65 - 99 mg/dL   Comment 1 Notify RN   Glucose, capillary     Status: Abnormal   Collection Time: 12/30/16  5:53 AM  Result Value Ref Range   Glucose-Capillary 135 (H) 65 - 99 mg/dL  Glucose, capillary     Status: Abnormal   Collection Time: 12/30/16  12:06 PM  Result Value Ref Range   Glucose-Capillary 250 (H) 65 - 99 mg/dL  Glucose, capillary     Status: Abnormal   Collection Time: 12/30/16  4:44 PM  Result Value Ref Range   Glucose-Capillary 314 (H) 65 - 99 mg/dL    Blood Alcohol level:  Lab Results  Component Value Date   ETH <5 12/21/2016   ETH <5 10/21/2016    Metabolic Disorder Labs: Lab Results  Component Value Date   HGBA1C 10.3 (H) 11/09/2016   MPG 249 11/09/2016   MPG 275 10/09/2016   Lab Results  Component Value Date   PROLACTIN 24.5 (H) 09/24/2016   PROLACTIN 3.4 (L) 07/07/2016   Lab Results  Component Value Date   CHOL 129 12/28/2016   TRIG 78 12/28/2016   HDL 54 12/28/2016   CHOLHDL 2.4 12/28/2016   VLDL 16 12/28/2016   LDLCALC 59 12/28/2016   LDLCALC 42 11/09/2016    Physical Findings: AIMS: Facial and Oral Movements Muscles of Facial Expression: None, normal Lips and Perioral Area: None, normal Jaw: None, normal Tongue: None, normal,Extremity Movements Upper (arms, wrists, hands, fingers): Mild Lower (legs, knees, ankles, toes): None, normal, Trunk Movements Neck, shoulders, hips: None, normal, Overall Severity Severity of abnormal movements (highest score from questions above): Mild Incapacitation due to abnormal movements: Minimal Patient's awareness of abnormal movements (rate only patient's report): No Awareness, Dental Status Current problems with teeth and/or dentures?: No Does patient usually wear dentures?: No  CIWA:  CIWA-Ar Total: 4 COWS:  COWS Total Score: 4  Musculoskeletal: Strength & Muscle Tone: within normal limits Gait & Station: normal Patient leans: [name]  Psychiatric Specialty Exam: Physical Exam  Nursing note and vitals reviewed.   Review of Systems  Psychiatric/Behavioral: The patient is nervous/anxious.   All other systems reviewed and are negative.   Blood pressure 123/71, pulse 96, temperature 98.8 F (37.1 C), temperature source Oral, resp. rate 16,  height 6' (1.829 m), weight 81.4 kg (179 lb 8 oz), SpO2 100 %.Body mass index is 24.34 kg/m.  General Appearance: Guarded  Eye Contact:  Minimal  Speech:  Slow  Volume:  Decreased  Mood:  Anxious  Affect:  Constricted  Thought Process:  Linear and Descriptions of Associations: Circumstantial  Orientation:  Other:  self, place  Thought Content:  Paranoid Ideation and Rumination  Suicidal Thoughts:  No  Homicidal Thoughts:  No  Memory:  Immediate;   Fair Recent;   Fair Remote;   Fair  Judgement:  Impaired  Insight:  Fair  Psychomotor Activity:  Restlessness and Tremor  Concentration:  Concentration: Fair and Attention Span: Fair  Recall:  [employer] of Knowledge:  Fair  Language:  Fair  Akathisia:  No  Handed:  Right  AIMS (if indicated):   5 , cogwheeling BL arms   Assets:  Desire for Improvement Resilience Social Support  ADL's:  Intact  Cognition:  WNL  Sleep:  Number of Hours: 6.25   Treatment Plan Summary:Patient with schizoaffective do , past hx of ECT , continues to be withdrawn, negative sx , sleep imprved last night , continues to need assistance to take care of his ADLs . Continue treatment.  Will continue today 12/30/16  plan as below except where it is noted.      Daily contact with patient to assess and evaluate symptoms and progress in treatment and Medication management    Reviewed past medical records,treatment plan.  Clozaril 150 mg po qhs for psychosis. Clozaril level pending. Increase Trazodone to 100 mg po qhs for sleep. Continue Amantadine 100 mg po bid  for neuroleptic induced tremors. Add Cogentin 0.5 mg po bid for EPS. Increase Luvox to 125 mg po qhs for affective sx. Propranolol 20 mg po bid for tremors, anxiety. Restarted home medications as indicated. Will continue to monitor vitals ,medication compliance and treatment side effects while patient is here.  Will monitor for medical issues as well as call consult as needed.  Reviewed labs  tsh -wnl, vitamin b12 - wnl. CSW will continue working on disposition.  Patient to participate in therapeutic milieu .       Ashlee Player, MD 12/30/2016, 5:35 PM

## 2016-12-30 NOTE — Progress Notes (Signed)
Nursing Progress Note: 7p-7a D: Pt currently presents with a flat/tremulous/depressed affect and behavior. Pt states "I've always had problems admitting what is going on with me. Something that has always bothered me is my neck. I get really bad neck pains." Interacting appropiately with milieu. Pt reports good sleep during the previous night with current medication regimen.   A: Pt provided with medications per providers orders. Pt's labs and vitals were monitored throughout the night. Pt supported emotionally and encouraged to express concerns and questions. Pt educated on medications.  R: Pt's safety ensured with 15 minute and environmental checks. Pt currently denies SI, HI, and AVH. Pt verbally contracts to seek staff if SI,HI, or AVH occurs and to consult with staff before acting on any harmful thoughts. Will continue to monitor.

## 2016-12-30 NOTE — Progress Notes (Signed)
Recreation Therapy Notes  Date: 12/30/2016 Time: 10:00am Location: [address] Dayroom  Group Topic: Goal Setting  Goal Area(s) Addresses:  Patient will be able to identify at least three goals they can work towards. Patient will be able to identify at least two obstacles for each goal they may run into.  Intervention: Construction paper, markers  Activity: Pt will be asked to identify goals they would like to accomplish in the future. Pt was asked to identify the date they plan on working towards that goal and obstacles they may face while trying to achieve each goal.  Education: Goal-Setting , Discharge Planning  Education Outcome: Acknowledges understanding  Clinical Observations/Feedback: Pt did not attend group.  Rachel Meyer, Recreation Therapy Intern   Malyn Aytes, LRT/CTRS 

## 2016-12-31 DIAGNOSIS — M542 Cervicalgia: Secondary | ICD-10-CM

## 2016-12-31 LAB — GLUCOSE, CAPILLARY
GLUCOSE-CAPILLARY: 72 mg/dL (ref 65–99)
GLUCOSE-CAPILLARY: 287 mg/dL — AB (ref 65–99)
Glucose-Capillary: 172 mg/dL — ABNORMAL HIGH (ref 65–99)
Glucose-Capillary: 127 mg/dL — ABNORMAL HIGH (ref 65–99)
Glucose-Capillary: 254 mg/dL — ABNORMAL HIGH (ref 65–99)

## 2016-12-31 NOTE — Plan of Care (Signed)
Problem: Self-Care: Goal: Ability to participate in self-care as condition permits will improve Outcome: Progressing Patient needs strict direction and encouragement by staff to perform self-care. Patient does not initiate proper hygiene on his own.

## 2016-12-31 NOTE — Progress Notes (Signed)
Patient ID: Tony Cunningham, male   DOB: 1975/08/05, 41 y.o.   MRN: [id number] PER STATE REGULATIONS 482.30  THIS CHART WAS REVIEWED FOR MEDICAL NECESSITY WITH RESPECT TO THE PATIENT'S ADMISSION/ DURATION OF STAY.  NEXT REVIEW DATE: 01/03/2017  Willa Rough, RN, BSN CASE MANAGER

## 2016-12-31 NOTE — Progress Notes (Signed)
Recreation Therapy Notes  Date: 12/31/16 Time: 1000 Location: [address] Dayroom  Group Topic: Stress Management  Goal Area(s) Addresses:  Patient will verbalize importance of using healthy stress management.  Patient will identify positive emotions associated with healthy stress management.   Intervention: 2 beach balls, chairs  Activity : Group Juggle.  Patients were placed in a circle.  Patients were given one ball to toss back and forth to each other.  LRT would time the patients to see how long they could keep the ball going.  Once patients were able to keep that one ball going, LRT added another ball to the rotation.  Patients were to keep both balls going without the balls coming to a stop.  If balls came to a stop, LRT would start time over.  Education:  Stress Management, Discharge Planning.   Education Outcome: Acknowledges edcuation/In group clarification offered/Needs additional education  Clinical Observations/Feedback: Pt did not attend group.   [name], LRT/CTRS         [name] A 12/31/2016 11:23 AM

## 2016-12-31 NOTE — Progress Notes (Signed)
[facility] MD Progress Note  12/31/2016 11:29 AM Tony Cunningham  MRN:  [id number]   Subjective: Pt states " no complaints and doing okay.'  Objective: Patient seen and chart reviewed.Discussed patient with treatment team.  Patient has been withdrawn , disheveled and needed prompting to take showers and participate in milieu therapy. Patient has been isolating and has limited interactions with the staff and peers. Patient needed frequent encouragement to come out of his room .  patient has been compliant with his medications and no changes made to his Clozaril and waiting for lab values and also increased his Luvox to treat his affect was symptoms.  As per RN: Pt currently presents with a flat/tremulous/depressed affect and behavior. Pt states "I've always had problems admitting what is going on with me. Something that has always bothered me is my neck. I get really bad neck pains." Interacting appropiately with milieu. Pt reports good sleep during the previous night with current medication regimen  Discussed with RN to support him to take care of his ADLs.  Principal Problem: Schizoaffective disorder, bipolar type (HCC) Diagnosis:   Patient Active Problem List   Diagnosis Date Noted  . 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 Schizoaffective disorder, bipolar type (HCC) [F25.0] 12/22/2016  . Schizoaffective disorder, depressive type (HCC) [F25.1] 09/23/2016  . Polysubstance abuse [F19.10] 07/17/2016  . Tardive dyskinesia [G24.01] 07/16/2016  . Tobacco use disorder [F17.200] 07/07/2016  . Dyslipidemia [E78.5] 07/07/2016  . Asthma [J45.909] 07/07/2016  . HTN (hypertension) [I10] 07/06/2016  . Diabetes (HCC) [E11.9] 12/25/2010   Total Time spent with patient: 25 minutes  Past Psychiatric History: Please see H&P.   Past Medical History:  Past Medical History:  Diagnosis Date  . Anxiety   . Asthma   . Diabetes mellitus   . High blood pressure   . Sinus complaint     History reviewed. No pertinent surgical history. Family History: Please see H&P.  Family Psychiatric  History:Please see H&P.  Social History:  History  Alcohol Use No     History  Drug Use No    Social History   Social History  . Marital status: Single    Spouse name: [name]  . Number of children: [name]  . Years of education: [name]   Social History Main Topics  . Smoking status: Current Every Day Smoker    Packs/day: 0.50    Types: Cigarettes  . Smokeless tobacco: Never Used  . Alcohol use No  . Drug use: No  . Sexual activity: Not Currently   Other Topics Concern  . None   Social History Narrative  . None   Additional Social History:                         Sleep: Fair  Appetite:  Fair  Current Medications: Current Facility-Administered Medications  Medication Dose Route Frequency Provider Last Rate Last Dose  . acetaminophen (TYLENOL) tablet 650 mg  650 mg Oral Q6H PRN Charm Rings, NP   650 mg at 12/30/16 2026  . alum & mag hydroxide-simeth (MAALOX/MYLANTA) 200-200-20 MG/5ML suspension 30 mL  30 mL Oral Q6H PRN Charm Rings, NP      . amantadine (SYMMETREL) capsule 100 mg  100 mg Oral BID Jomarie Longs, MD   100 mg at 12/30/16 1700  . benztropine (COGENTIN) tablet 0.5 mg  0.5 mg Oral BID Eappen, Saramma, MD      . cloZAPine (CLOZARIL) tablet 150 mg  150 mg Oral QHS Oneta Rack, NP   150 mg at 12/30/16 2248  . fluvoxaMINE (LUVOX) tablet 125 mg  125 mg Oral QHS Jomarie Longs, MD   125 mg at 12/30/16 2251  . ibuprofen (ADVIL,MOTRIN) tablet 600 mg  600 mg Oral Q8H PRN Nelly Rout, MD      . insulin aspart (novoLOG) injection 0-15 Units  0-15 Units Subcutaneous TID WC Charm Rings, NP   3 Units at 12/31/16 [phone]  . insulin aspart (novoLOG) injection 0-5 Units  0-5 Units Subcutaneous QHS Charm Rings, NP   2 Units at 12/30/16 2252  . insulin aspart (novoLOG) injection 4 Units  4 Units Subcutaneous TID WC Jomarie Longs, MD   4 Units at  12/31/16 0622  . insulin glargine (LANTUS) injection 45 Units  45 Units Subcutaneous QHS Armandina Stammer I, NP   45 Units at 12/30/16 2028  . lisinopril (PRINIVIL,ZESTRIL) tablet 20 mg  20 mg Oral Daily Charm Rings, NP   20 mg at 12/30/16 0854  . magnesium hydroxide (MILK OF MAGNESIA) suspension 30 mL  30 mL Oral Daily PRN Charm Rings, NP      . metFORMIN (GLUCOPHAGE) tablet 1,000 mg  1,000 mg Oral BID WC Charm Rings, NP   1,000 mg at 12/30/16 1700  . montelukast (SINGULAIR) tablet 10 mg  10 mg Oral QHS Charm Rings, NP   10 mg at 12/30/16 2252  . nicotine polacrilex (NICORETTE) gum 2 mg  2 mg Oral PRN Eappen, Levin Bacon, MD      . ondansetron (ZOFRAN) tablet 4 mg  4 mg Oral Q8H PRN Charm Rings, NP      . pantoprazole (PROTONIX) EC tablet 40 mg  40 mg Oral Daily Charm Rings, NP   40 mg at 12/30/16 0854  . polyethylene glycol (MIRALAX / GLYCOLAX) packet 17 g  17 g Oral Daily Charm Rings, NP   17 g at 12/29/16 0820  . propranolol (INDERAL) tablet 20 mg  20 mg Oral BID Charm Rings, NP   20 mg at 12/30/16 1700  . simvastatin (ZOCOR) tablet 20 mg  20 mg Oral q1800 Cobos, Rockey Situ, MD   20 mg at 12/30/16 1800  . traZODone (DESYREL) tablet 100 mg  100 mg Oral QHS Eappen, Levin Bacon, MD   100 mg at 12/30/16 2252    Lab Results:  Results for orders placed or performed during the hospital encounter of 12/22/16 (from the past 48 hour(s))  Glucose, capillary     Status: Abnormal   Collection Time: 12/29/16 11:59 AM  Result Value Ref Range   Glucose-Capillary 54 (L) 65 - 99 mg/dL  Glucose, capillary     Status: Abnormal   Collection Time: 12/29/16 12:18 PM  Result Value Ref Range   Glucose-Capillary 104 (H) 65 - 99 mg/dL  Glucose, capillary     Status: Abnormal   Collection Time: 12/29/16  5:08 PM  Result Value Ref Range   Glucose-Capillary 212 (H) 65 - 99 mg/dL  Glucose, capillary     Status: Abnormal   Collection Time: 12/29/16  8:24 PM  Result Value Ref Range    Glucose-Capillary 348 (H) 65 - 99 mg/dL   Comment 1 Notify RN   Glucose, capillary     Status: Abnormal   Collection Time: 12/30/16  5:53 AM  Result Value Ref Range   Glucose-Capillary 135 (H) 65 - 99 mg/dL  Glucose, capillary     Status:  Abnormal   Collection Time: 12/30/16 12:06 PM  Result Value Ref Range   Glucose-Capillary 250 (H) 65 - 99 mg/dL  Glucose, capillary     Status: Abnormal   Collection Time: 12/30/16  4:44 PM  Result Value Ref Range   Glucose-Capillary 314 (H) 65 - 99 mg/dL  Glucose, capillary     Status: Abnormal   Collection Time: 12/30/16  8:38 PM  Result Value Ref Range   Glucose-Capillary 205 (H) 65 - 99 mg/dL  Glucose, capillary     Status: None   Collection Time: 12/31/16  4:09 AM  Result Value Ref Range   Glucose-Capillary 72 65 - 99 mg/dL  Glucose, capillary     Status: Abnormal   Collection Time: 12/31/16  5:58 AM  Result Value Ref Range   Glucose-Capillary 172 (H) 65 - 99 mg/dL    Blood Alcohol level:  Lab Results  Component Value Date   ETH <5 12/21/2016   ETH <5 10/21/2016    Metabolic Disorder Labs: Lab Results  Component Value Date   HGBA1C 10.3 (H) 11/09/2016   MPG 249 11/09/2016   MPG 275 10/09/2016   Lab Results  Component Value Date   PROLACTIN 24.5 (H) 09/24/2016   PROLACTIN 3.4 (L) 07/07/2016   Lab Results  Component Value Date   CHOL 129 12/28/2016   TRIG 78 12/28/2016   HDL 54 12/28/2016   CHOLHDL 2.4 12/28/2016   VLDL 16 12/28/2016   LDLCALC 59 12/28/2016   LDLCALC 42 11/09/2016    Physical Findings: AIMS: Facial and Oral Movements Muscles of Facial Expression: None, normal Lips and Perioral Area: None, normal Jaw: None, normal Tongue: None, normal,Extremity Movements Upper (arms, wrists, hands, fingers): Mild Lower (legs, knees, ankles, toes): None, normal, Trunk Movements Neck, shoulders, hips: None, normal, Overall Severity Severity of abnormal movements (highest score from questions above):  Mild Incapacitation due to abnormal movements: Minimal Patient's awareness of abnormal movements (rate only patient's report): No Awareness, Dental Status Current problems with teeth and/or dentures?: No Does patient usually wear dentures?: No  CIWA:  CIWA-Ar Total: 4 COWS:  COWS Total Score: 4  Musculoskeletal: Strength & Muscle Tone: within normal limits Gait & Station: normal Patient leans: [name]  Psychiatric Specialty Exam: Physical Exam  Nursing note and vitals reviewed.   Review of Systems  Psychiatric/Behavioral: The patient is nervous/anxious.   All other systems reviewed and are negative.   Blood pressure 123/71, pulse 96, temperature 98.8 F (37.1 C), temperature source Oral, resp. rate 16, height 6' (1.829 m), weight 81.4 kg (179 lb 8 oz), SpO2 100 %.Body mass index is 24.34 kg/m.  General Appearance: Guarded  Eye Contact:  Minimal  Speech:  Slow  Volume:  Decreased  Mood:  Anxious  Affect:  Constricted  Thought Process:  Linear and Descriptions of Associations: Circumstantial  Orientation:  Other:  self, place  Thought Content:  Paranoid Ideation and Rumination  Suicidal Thoughts:  No  Homicidal Thoughts:  No  Memory:  Immediate;   Fair Recent;   Fair Remote;   Fair  Judgement:  Impaired  Insight:  Fair  Psychomotor Activity:  Restlessness and Tremor  Concentration:  Concentration: Fair and Attention Span: Fair  Recall:  [employer] of Knowledge:  Fair  Language:  Fair  Akathisia:  No  Handed:  Right  AIMS (if indicated):   5 , cogwheeling BL arms   Assets:  Desire for Improvement Resilience Social Support  ADL's:  Intact  Cognition:  WNL  Sleep:  Number of Hours: 0.5   Treatment Plan Summary: Patient with schizoaffective d , past hx of ECT , continues to be withdrawn, negative sx , sleep improved last two nights, continues to need assistance to take care of his ADLs and frequent prompting to care for himself including taking shower.  Will continue  today 12/31/16  plan as below except where it is noted.  Daily contact with patient to assess and evaluate symptoms and progress in treatment and Medication management   Continue treatment plan and medication management as below:  Continue Clozaril 150 mg po qhs for psychosis. Clozaril level pending. Continue Trazodone to 100 mg po qhs for sleep. Continue Amantadine 100 mg po bid for neuroleptic induced tremors. Continue Cogentin 0.5 mg po bid for EPS. Continue Luvox to 125 mg po qhs for affective sx. Continue Propranolol 20 mg po bid for tremors, anxiety. Restarted home medications as indicated. Will continue to monitor vitals ,medication compliance and treatment side effects  Will monitor for medical issues as well as call consult as needed.  Reviewed labs tsh -wnl, vitamin b12 - wnl.  CSW will continue working on disposition.  Patient encourage to participate in therapeutic milieu .    Leata Mouse, MD 12/31/2016, 11:29 AM

## 2016-12-31 NOTE — [facility] Group Notes (Signed)
[facility] LCSW Group Therapy  12/31/2016 4:45 PM   Type of Therapy:  Group Therapy  Participation Level:  Active  Participation Quality:  Attentive  Affect:  Appropriate  Cognitive:  Appropriate  Insight:  Improving  Engagement in Therapy:  Engaged  Modes of Intervention:  Clarification, Education, Exploration and Socialization  Summary of Progress/Problems: Today's group focused on relapse prevention.  We defined the term, and then brainstormed on ways to prevent relapse. Invited. In bed asleep.  [name] 12/31/2016 , 4:45 PM

## 2016-12-31 NOTE — Progress Notes (Signed)
Nursing Progress Note 1900-0730  D) Patient presents with blunted affect but is calm and cooperative on the unit. Patient encouraged to perform hygiene appropriately. Patient is isolative to room. Patient denies SI/HI/AVH or pain. Patient contracts for safety on the unit. Patient blood sugars are monitored and treated per MD orders.  A) Emotional support given. 1:1 interaction and active listening provided. Patient medicated as prescribed. Medications and plan of care reviewed with patient. Patient verbalized understanding without further questions.  Snacks and fluids provided. Opportunities for questions or concerns presented to patient. Patient encouraged to continue to work on treatment goals. Labs, vital signs and patient behavior monitored throughout shift. Patient safety maintained with q15 min safety checks. Low fall risk precautions in place and reviewed with patient; patient verbalized understanding.  R) Patient receptive to interaction with nurse. Patient remains safe on the unit at this time. Patient denies any adverse medication reactions at this time. Patient is resting in bed without complaints. Will continue to monitor.

## 2016-12-31 NOTE — Tx Team (Signed)
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 acetaminophen (TYLENOL) tablet 650 mg  650 mg Oral Q6H PRN Patrecia Pour, NP   650 mg at 12/30/16 2026  . alum & mag hydroxide-simeth (MAALOX/MYLANTA) 200-200-20 MG/5ML suspension 30 mL  30 mL Oral Q6H PRN Patrecia Pour, NP      . amantadine (SYMMETREL) capsule 100 mg  100 mg Oral BID Ursula Alert, MD   100 mg at 12/30/16 1700  . benztropine (COGENTIN) tablet 0.5 mg  0.5 mg Oral BID Eappen, Saramma, MD      . cloZAPine (CLOZARIL) tablet 150 mg  150 mg Oral QHS Derrill Center, NP   150 mg at 12/30/16 2248  . fluvoxaMINE (LUVOX) tablet 125 mg  125 mg Oral QHS Ursula Alert, MD   125 mg at 12/30/16 2251  . ibuprofen (ADVIL,MOTRIN) tablet 600 mg  600 mg Oral Q8H PRN Hampton Abbot, MD      . insulin aspart (novoLOG) injection 0-15 Units  0-15 Units Subcutaneous TID WC Patrecia Pour, NP   3 Units at 12/31/16 [phone]  . insulin aspart (novoLOG) injection 0-5 Units  0-5 Units Subcutaneous QHS Patrecia Pour, NP   2 Units at 12/30/16 2252  . insulin aspart (novoLOG) injection 4 Units  4 Units Subcutaneous TID WC Ursula Alert, MD   4 Units at 12/31/16 0622  . insulin glargine (LANTUS) injection 45 Units  45 Units Subcutaneous QHS Lindell Spar I, NP   45 Units at 12/30/16 2028  . lisinopril (PRINIVIL,ZESTRIL) tablet 20 mg  20 mg Oral Daily Patrecia Pour, NP   20 mg at 12/30/16 0854  . magnesium hydroxide (MILK OF MAGNESIA) suspension 30 mL  30 mL Oral Daily PRN Patrecia Pour, NP      . metFORMIN (GLUCOPHAGE) tablet 1,000 mg  1,000  mg Oral BID WC Patrecia Pour, NP   1,000 mg at 12/30/16 1700  . montelukast (SINGULAIR) tablet 10 mg  10 mg Oral QHS Patrecia Pour, NP   10 mg at 12/30/16 2252  . nicotine polacrilex (NICORETTE) gum 2 mg  2 mg Oral PRN Eappen, Ria Clock, MD      . ondansetron (ZOFRAN) tablet 4 mg  4 mg Oral Q8H PRN Patrecia Pour, NP      . pantoprazole (PROTONIX) EC tablet 40 mg  40 mg Oral Daily Patrecia Pour, NP   40 mg at 12/30/16 0854  . polyethylene glycol (MIRALAX / GLYCOLAX) packet 17 g  17 g Oral Daily Patrecia Pour, NP   17 g at 12/29/16 0820  . propranolol (INDERAL) tablet 20 mg  20 mg Oral BID Patrecia Pour, NP   20 mg at 12/30/16 1700  . simvastatin (ZOCOR) tablet 20 mg  20 mg Oral q1800 Cobos, Myer Peer, MD   20 mg at 12/30/16 1800  . traZODone (DESYREL) tablet 100 mg  100 mg Oral QHS Eappen, Ria Clock, MD   100 mg at 12/30/16 2252    PTA Medications: Prescriptions Prior to Admission  Medication Sig Dispense Refill Last Dose  . cloZAPine (CLOZARIL) 100 MG tablet Take 1 tablet (100 mg total) by mouth  at bedtime. 30 tablet 1 Past Week at Unknown time  . fluvoxaMINE (LUVOX) 100 MG tablet Take 1 tablet (100 mg total) by mouth at bedtime. 30 tablet 1 Past Week at Unknown time  . insulin aspart (NOVOLOG) 100 UNIT/ML injection Inject 10 Units into the skin 3 (three) times daily with meals. 10 mL 11 Past Week at Unknown time  . insulin glargine (LANTUS) 100 UNIT/ML injection Inject 0.55 mLs (55 Units total) into the skin at bedtime. 10 mL 11 Past Week at Unknown time  . lisinopril (PRINIVIL,ZESTRIL) 20 MG tablet Take 1 tablet (20 mg total) by mouth daily. 30 tablet 1 Past Week at Unknown time  . meloxicam (MOBIC) 7.5 MG tablet Take 1 tablet (7.5 mg total) by mouth 2 (two) times daily after a meal. 60 tablet 1 Past Week at Unknown time  . metFORMIN (GLUCOPHAGE) 1000 MG tablet Take 1 tablet (1,000 mg total) by mouth 2 (two) times daily with a meal. 60 tablet 1 Past Week at Unknown time  . montelukast  (SINGULAIR) 10 MG tablet Take 1 tablet (10 mg total) by mouth at bedtime. 30 tablet 1 Past Week at Unknown time  . nicotine (NICODERM CQ - DOSED IN MG/24 HR) 7 mg/24hr patch Place 1 patch (7 mg total) onto the skin daily. 28 patch 0 Past Week at Unknown time  . pantoprazole (PROTONIX) 40 MG tablet Take 1 tablet (40 mg total) by mouth daily. 30 tablet 1 Past Week at Unknown time  . polyethylene glycol (MIRALAX / GLYCOLAX) packet Take 17 g by mouth daily. 14 each 1 Past Week at Unknown time  . propranolol (INDERAL) 20 MG tablet Take 1 tablet (20 mg total) by mouth 2 (two) times daily. 60 tablet 1 Past Week at Unknown time  . simvastatin (ZOCOR) 40 MG tablet Take 1 tablet (40 mg total) by mouth daily at 6 PM. 30 tablet 1 Past Week at Unknown time  . temazepam (RESTORIL) 15 MG capsule Take 1 capsule (15 mg total) by mouth at bedtime. 30 capsule 1 Past Week at Unknown time    Patient Stressors: Financial difficulties Medication change or noncompliance  Patient Strengths: Network engineer for treatment/growth Supportive family/friends  Treatment Modalities: Medication Management, Group therapy, Case management,  1 to 1 session with clinician, Psychoeducation, Recreational therapy.   Physician Treatment Plan for Primary Diagnosis: Schizoaffective Disorder, Bipolar Type Long Term Goal(s): Improvement in symptoms so as ready for discharge  Short Term Goals: Ability to identify changes in lifestyle to reduce recurrence of condition will improve Ability to verbalize feelings will improve Ability to disclose and discuss suicidal ideas Ability to demonstrate self-control will improve Ability to identify and develop effective coping behaviors will improve Compliance with prescribed medications will improve  Medication Management: Evaluate patient's response, side effects, and tolerance of medication regimen.  Therapeutic Interventions: 1 to 1 sessions, Unit  Group sessions and Medication administration.  Evaluation of Outcomes: Progressing   8/6: Anxiety/HTN: Continue Propranolol 20 mg daily.  Mood Control/Stabilization:  Continue Clozapine (Clozaril) from 169m Q hs.  Depression: Continue Fluvoxamine (Luvox) 100 mg Q hs.  8/9: Patient with schizoaffective do , past hx of ECT , recent decompensation , seen as having sleep issues, status changed to do not admit as he has been taking roommate's belongings   Clozaril 150 mg po qhs for psychosis. Clozaril level pending. Increase Trazodone to 100 mg po qhs for sleep. Continue Amantadine 100 mg po bid for neuroleptic induced tremors.  Will continue Luvox 100 mg po at qhs for affective sx. Propranolol 20 mg po bid for tremors, anxiety.  Insomnia: Continue Trazodone  50 mg prn at bedtime. 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  Therapeutic Interventions: 1 to 1 sessions, Unit Group sessions and Medication administration.  Evaluation of Outcomes: Progressing   RN Treatment Plan for Primary Diagnosis: Schizoaffective Disorder, Bipolar Type Long Term Goal(s): Knowledge of disease and therapeutic regimen to maintain health will improve  Short Term Goals: Ability to identify and develop effective coping behaviors  will improve and Compliance with prescribed medications will improve  Medication Management: RN will administer medications as ordered by provider, will assess and evaluate patient's response and provide education to patient for prescribed medication. RN will report any adverse and/or side effects to prescribing provider.  Therapeutic Interventions: 1 on 1 counseling sessions, Psychoeducation, Medication administration, Evaluate responses to treatment, Monitor vital signs and CBGs as ordered, Perform/monitor CIWA, COWS, AIMS and Fall Risk screenings as ordered, Perform wound care treatments as ordered.  Evaluation of Outcomes: Progressing   LCSW Treatment Plan for Primary Diagnosis: Schizoaffective Disorder, Bipolar Type Long Term Goal(s): Safe transition to appropriate next level of care at discharge, Engage patient in therapeutic group addressing interpersonal concerns.  Short Term Goals: Engage patient in aftercare planning with referrals and resources  Therapeutic Interventions: Assess for all discharge needs, 1 to 1 time with Social worker, Explore available resources and support systems, Assess for adequacy in community support network, Educate family and significant other(s) on suicide prevention, Complete Psychosocial Assessment, Interpersonal group therapy.  Evaluation of Outcomes: Met  Return home, follow up outpt   Progress in Treatment: Attending groups: Intermittently Participating in groups: Minimally Taking medication as prescribed: Yes Toleration medication: Yes, no side effects reported at this time Family/Significant other contact made: No Patient understands diagnosis: Yes AEB asking for help with suicidal thoughts Discussing patient identified problems/goals with staff: Yes Medical problems stabilized or resolved: Yes Denies suicidal/homicidal ideation: Yes Issues/concerns per patient self-inventory: None Other: [name]  New problem(s) identified: None identified at  this time.   New Short Term/Long Term Goal(s): "I want to feel better. I'm too sleepy."  Discharge Plan or Barriers: return home w family and follow up outpatient, consider group home placement  Reason for Continuation of Hospitalization:   Depression Medication stabilization Suicidal ideation   Estimated Length of Stay: 01/05/17  Attendees: Patient: Tony Cunningham 12/31/2016  10:24 AM  Physician: Ursula Alert, MD 12/31/2016  10:24 AM  Nursing: Sena Hitch, RN 12/31/2016  10:24 AM  RN Care Manager: Lars Pinks, RN 12/31/2016  10:24 AM  Social Worker: Ripley Fraise 12/31/2016  10:24 AM  Recreational Therapist: Winfield Cunas 12/31/2016  10:24 AM  Other: Norberto Sorenson 12/31/2016  10:24 AM  Other:  12/31/2016  10:24 AM    Scribe for Treatment Team:  Roque Lias LCSW 12/31/2016 10:24 AM

## 2016-12-31 NOTE — Progress Notes (Signed)
Patient denies SI, HI and AVH.  Patient has been isolative to his room this shift.  Patient is malodorous and was given a bath by staff this shift.  During the bath patient was noted to have feces between the cleft of his buttocks and redness and irritation noted.  Patient was encouraged to continue to complete hygiene as needed.   Assess patient for safety, offer medications as prescribed, engage patient in 1:1 staff talks.    Patient able to contract for safety.  Continue to monitor as prescribed.

## 2017-01-01 DIAGNOSIS — G2111 Neuroleptic induced parkinsonism: Secondary | ICD-10-CM

## 2017-01-01 DIAGNOSIS — F29 Unspecified psychosis not due to a substance or known physiological condition: Secondary | ICD-10-CM

## 2017-01-01 LAB — GLUCOSE, CAPILLARY
Glucose-Capillary: 105 mg/dL — ABNORMAL HIGH (ref 65–99)
Glucose-Capillary: 376 mg/dL — ABNORMAL HIGH (ref 65–99)
Glucose-Capillary: 324 mg/dL — ABNORMAL HIGH (ref 65–99)
Glucose-Capillary: 184 mg/dL — ABNORMAL HIGH (ref 65–99)

## 2017-01-01 NOTE — Progress Notes (Signed)
[facility] [place] MD Progress Note  01/01/2017 3:33 PM Wendelin Bradt  MRN:  [id number]   Subjective: Daivon reports, "I'm doing better, I just need a little bit time".  Objective: Patient seen and chart reviewed. Discussed patient with treatment team. Patient has been withdrawn, disheveled and needed prompting to take showers and participate in milieu therapy. Patient has been isolating and has limited interactions with the staff and peers. Patient needed frequent encouragement to come out of his room.  patient has been compliant with his medications and no changes made to his Clozaril and waiting for lab values and also increased his Luvox to treat his affect was symptoms. As per RN: Pt currently presents with a flat/tremulous/depressed affect and behavior. Pt states "I've always had problems admitting what is going on with me. Something that has always bothered me is my neck. I get really bad neck pains." Pt reports good sleep during the previous night with current medication regimen Discussed with RN to support him to take care of his ADLs.  Principal Problem: Schizoaffective disorder, bipolar type ([facility]) Diagnosis:   Patient Active Problem List   Diagnosis Date Noted  . 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 Schizoaffective disorder, bipolar type ([facility]) [F25.0] 12/22/2016  . Schizoaffective disorder, depressive type ([facility]) [F25.1] 09/23/2016  . Polysubstance abuse [F19.10] 07/17/2016  . Tardive dyskinesia [G24.01] 07/16/2016  . Tobacco use disorder [F17.200] 07/07/2016  . Dyslipidemia [E78.5] 07/07/2016  . Asthma [J45.909] 07/07/2016  . HTN (hypertension) [I10] 07/06/2016  . Diabetes ([facility]) [E11.9] 12/25/2010   Total Time spent with patient: 15 minutes  Past Psychiatric History: Please see H&P.   Past Medical History:  Past Medical History:  Diagnosis Date  . Anxiety   . Asthma   . Diabetes mellitus   . High blood pressure   . Sinus complaint    History reviewed. No  pertinent surgical history. Family History: Please see H&P.  Family Psychiatric  History: Please see H&P.  Social History:  History  Alcohol Use No     History  Drug Use No    Social History   Social History  . Marital status: Single    Spouse name: [name]  . Number of children: [name]  . Years of education: [name]   Social History Main Topics  . Smoking status: Current Every Day Smoker    Packs/day: 0.50    Types: Cigarettes  . Smokeless tobacco: Never Used  . Alcohol use No  . Drug use: No  . Sexual activity: Not Currently   Other Topics Concern  . None   Social History Narrative  . None   Additional Social History:   Sleep: Fair  Appetite:  Fair  Current Medications: Current Facility-Administered Medications  Medication Dose Route Frequency Provider Last Rate Last Dose  . acetaminophen (TYLENOL) tablet 650 mg  650 mg Oral Q6H PRN Charm Rings, NP   650 mg at 12/31/16 1710  . alum & mag hydroxide-simeth (MAALOX/MYLANTA) 200-200-20 MG/5ML suspension 30 mL  30 mL Oral Q6H PRN Charm Rings, NP      . amantadine (SYMMETREL) capsule 100 mg  100 mg Oral BID Jomarie Longs, MD   100 mg at 01/01/17 0355  . benztropine (COGENTIN) tablet 0.5 mg  0.5 mg Oral BID Jomarie Longs, MD   0.5 mg at 01/01/17 0833  . cloZAPine (CLOZARIL) tablet 150 mg  150 mg Oral QHS Oneta Rack, NP   150 mg at 12/31/16 2107  . fluvoxaMINE (LUVOX) tablet 125 mg  125 mg Oral QHS Jomarie Longs, MD   125 mg at 12/31/16 2107  . ibuprofen (ADVIL,MOTRIN) tablet 600 mg  600 mg Oral Q8H PRN Nelly Rout, MD      . insulin aspart (novoLOG) injection 0-15 Units  0-15 Units Subcutaneous TID WC Charm Rings, NP   15 Units at 01/01/17 1214  . insulin aspart (novoLOG) injection 0-5 Units  0-5 Units Subcutaneous QHS Charm Rings, NP   3 Units at 12/31/16 2107  . insulin aspart (novoLOG) injection 4 Units  4 Units Subcutaneous TID WC Jomarie Longs, MD   4 Units at 01/01/17 1214  . insulin  glargine (LANTUS) injection 45 Units  45 Units Subcutaneous QHS Armandina Stammer I, NP   45 Units at 12/31/16 2055  . lisinopril (PRINIVIL,ZESTRIL) tablet 20 mg  20 mg Oral Daily Charm Rings, NP   20 mg at 01/01/17 7169  . magnesium hydroxide (MILK OF MAGNESIA) suspension 30 mL  30 mL Oral Daily PRN Charm Rings, NP      . metFORMIN (GLUCOPHAGE) tablet 1,000 mg  1,000 mg Oral BID WC Charm Rings, NP   1,000 mg at 01/01/17 6789  . montelukast (SINGULAIR) tablet 10 mg  10 mg Oral QHS Charm Rings, NP   10 mg at 12/31/16 2108  . nicotine polacrilex (NICORETTE) gum 2 mg  2 mg Oral PRN Eappen, Levin Bacon, MD      . ondansetron (ZOFRAN) tablet 4 mg  4 mg Oral Q8H PRN Charm Rings, NP      . pantoprazole (PROTONIX) EC tablet 40 mg  40 mg Oral Daily Charm Rings, NP   40 mg at 01/01/17 0834  . polyethylene glycol (MIRALAX / GLYCOLAX) packet 17 g  17 g Oral Daily Charm Rings, NP   17 g at 12/29/16 0820  . propranolol (INDERAL) tablet 20 mg  20 mg Oral BID Charm Rings, NP   20 mg at 01/01/17 0830  . simvastatin (ZOCOR) tablet 20 mg  20 mg Oral q1800 Cobos, Rockey Situ, MD   20 mg at 12/31/16 1708  . traZODone (DESYREL) tablet 100 mg  100 mg Oral QHS Eappen, Levin Bacon, MD   100 mg at 12/31/16 2107   Lab Results:  Results for orders placed or performed during the hospital encounter of 12/22/16 (from the past 48 hour(s))  Glucose, capillary     Status: Abnormal   Collection Time: 12/30/16  4:44 PM  Result Value Ref Range   Glucose-Capillary 314 (H) 65 - 99 mg/dL  Glucose, capillary     Status: Abnormal   Collection Time: 12/30/16  8:38 PM  Result Value Ref Range   Glucose-Capillary 205 (H) 65 - 99 mg/dL  Glucose, capillary     Status: None   Collection Time: 12/31/16  4:09 AM  Result Value Ref Range   Glucose-Capillary 72 65 - 99 mg/dL  Glucose, capillary     Status: Abnormal   Collection Time: 12/31/16  5:58 AM  Result Value Ref Range   Glucose-Capillary 172 (H) 65 - 99 mg/dL   Glucose, capillary     Status: Abnormal   Collection Time: 12/31/16 11:44 AM  Result Value Ref Range   Glucose-Capillary 127 (H) 65 - 99 mg/dL   Comment 1 Notify RN    Comment 2 Document in Chart   Glucose, capillary     Status: Abnormal   Collection Time: 12/31/16  5:04 PM  Result Value Ref Range  Glucose-Capillary 254 (H) 65 - 99 mg/dL  Glucose, capillary     Status: Abnormal   Collection Time: 12/31/16  8:40 PM  Result Value Ref Range   Glucose-Capillary 287 (H) 65 - 99 mg/dL  Glucose, capillary     Status: Abnormal   Collection Time: 01/01/17  6:16 AM  Result Value Ref Range   Glucose-Capillary 105 (H) 65 - 99 mg/dL  Glucose, capillary     Status: Abnormal   Collection Time: 01/01/17 11:55 AM  Result Value Ref Range   Glucose-Capillary 376 (H) 65 - 99 mg/dL   Blood Alcohol level:  Lab Results  Component Value Date   ETH <5 12/21/2016   ETH <5 10/21/2016   Metabolic Disorder Labs: Lab Results  Component Value Date   HGBA1C 10.3 (H) 11/09/2016   MPG 249 11/09/2016   MPG 275 10/09/2016   Lab Results  Component Value Date   PROLACTIN 24.5 (H) 09/24/2016   PROLACTIN 3.4 (L) 07/07/2016   Lab Results  Component Value Date   CHOL 129 12/28/2016   TRIG 78 12/28/2016   HDL 54 12/28/2016   CHOLHDL 2.4 12/28/2016   VLDL 16 12/28/2016   LDLCALC 59 12/28/2016   LDLCALC 42 11/09/2016   Physical Findings: AIMS: Facial and Oral Movements Muscles of Facial Expression: None, normal Lips and Perioral Area: None, normal Jaw: None, normal Tongue: None, normal,Extremity Movements Upper (arms, wrists, hands, fingers): Mild Lower (legs, knees, ankles, toes): None, normal, Trunk Movements Neck, shoulders, hips: None, normal, Overall Severity Severity of abnormal movements (highest score from questions above): Mild Incapacitation due to abnormal movements: Minimal Patient's awareness of abnormal movements (rate only patient's report): No Awareness, Dental Status Current  problems with teeth and/or dentures?: No Does patient usually wear dentures?: No  CIWA:  CIWA-Ar Total: 4 COWS:  COWS Total Score: 4  Musculoskeletal: Strength & Muscle Tone: within normal limits Gait & Station: normal Patient leans: [name]  Psychiatric Specialty Exam: Physical Exam  Nursing note and vitals reviewed.   Review of Systems  Psychiatric/Behavioral: The patient is nervous/anxious.   All other systems reviewed and are negative.   Blood pressure 100/79, pulse 88, temperature 97.8 F (36.6 C), temperature source Oral, resp. rate 20, height 6' (1.829 m), weight 81.4 kg (179 lb 8 oz), SpO2 100 %.Body mass index is 24.34 kg/m.  General Appearance: Guarded  Eye Contact:  Minimal  Speech:  Slow  Volume:  Decreased  Mood:  Anxious  Affect:  Constricted  Thought Process:  Linear and Descriptions of Associations: Circumstantial  Orientation:  Other:  self, place  Thought Content:  Paranoid Ideation and Rumination  Suicidal Thoughts:  No  Homicidal Thoughts:  No  Memory:  Immediate;   Fair Recent;   Fair Remote;   Fair  Judgement:  Impaired  Insight:  Fair  Psychomotor Activity:  Restlessness and Tremor  Concentration:  Concentration: Fair and Attention Span: Fair  Recall:  [employer] of Knowledge:  Fair  Language:  Fair  Akathisia:  No  Handed:  Right  AIMS (if indicated):   5 , cogwheeling BL arms   Assets:  Desire for Improvement Resilience Social Support  ADL's:  Intact  Cognition:  WNL  Sleep:  Number of Hours: 1   Treatment Plan Summary: Patient with schizoaffective disorder, past hx of ECT, continues to be withdrawn, negative symptoms , sleep improved last two nights, continues to need assistance to take care of his ADLs and frequent prompting to care for himself  including taking shower.  Will continue today 01/01/17  plan as below except where it is noted.  Daily contact with patient to assess and evaluate symptoms and progress in treatment and Medication  management   -Continue treatment plan and medication management as below:  -Continue Clozaril 150 mg po qhs for psychosis. Clozaril level pending. -Continue Trazodone 100 mg po qhs for sleep. -Continue Amantadine 100 mg po bid for neuroleptic induced tremors. -Continue Cogentin 0.5 mg po bid for EPS. -Continue Luvox to 125 mg po qhs for affective sx. -Continue Propranolol 20 mg po bid for tremors, anxiety. -Continue home medications as indicated. Will continue to monitor vitals ,medication compliance and treatment side effects  Will monitor for medical issues as well as call consult as needed.  Reviewed labs tsh -wnl, vitamin b12 - wnl.  CSW will continue working on disposition.  Patient encourage to participate in therapeutic milieu .   Sanjuana Kava, NP, PMHNP, FNP-BC. 01/01/2017, 3:33 PM  Patient ID: [name], male   DOB: [birth year], 41 y.o.   MRN: [id number]

## 2017-01-01 NOTE — Progress Notes (Signed)
Adult Psychoeducational Group Note  Date:  01/01/2017 Time:  9:01 PM  Group Topic/Focus:  Wrap-Up Group:   The focus of this group is to help patients review their daily goal of treatment and discuss progress on daily workbooks.  Participation Level:  Minimal  Participation Quality:  Appropriate  Affect:  Flat  Cognitive:  Oriented  Insight: Limited  Engagement in Group:  Engaged  Modes of Intervention:  Socialization and Support  Additional Comments:  Patient attended and participated in group tonight. He reports that his day was a eight. He slept a lot today, he had a shower and is feeling better.  Lita Mains [facility] 01/01/2017, 9:01 PM

## 2017-01-01 NOTE — Progress Notes (Signed)
D: Pt denies SI/HI/AVH. Pt is pleasant and cooperative. Pt stated he was doing ok today, pt did interact on milieu some this evening. Pt appeared depressed , but brightened on approach from this Clinical research associate.   A: Pt was offered support and encouragement. Pt was given scheduled medications. Pt was encourage to attend groups. Q 15 minute checks were done for safety.   R:Pt attends groups and interacts well with peers and staff. Pt is taking medication. Pt has no complaints at this time .Pt receptive to treatment and safety maintained on unit.

## 2017-01-01 NOTE — Progress Notes (Signed)
Isolating to room, slept much of day. Room is very dirty with paper strewn about, strong odor of urine, bed linens very dirty. Patient taken to tub room for assistance with bathing, bathed well with supervision and prompting, clean scrubs given and incontinence pad as patient admits he is sometimes incontinent of urine. Urine soaked underpants found in his room while cleaning during patient's bath; laundered. Encourage patient to perform regular hygiene and assess for ability to do so. Denies SI and psychotic symptoms, affect blunted and verbal interaction minimal.

## 2017-01-01 NOTE — Progress Notes (Signed)
Recreation Therapy Notes  Date: 01/01/2017 Time: 10:00am Location: [address] Dayroom  Group Topic: Communication and Problem-Solving  Goal Area(s) Addresses:  Pts will be able to successfully work with their team members to reach a common goal with the materials they are provided.  Intervention: Uncooked Spaghetti, masking tape and marshmallows  Activity: Pts were asked to work in a group to create a tower with spaghetti and masking tape that will hold a marshmallow on top.   Education: Communication, Problem-Solving, Discharge Planning  Education Outcome: Needs additional education  Clinical Observations/Feedback: Pt did not attend group.  Rachel Meyer, Recreation Therapy Intern   Tony Cunningham, LRT/CTRS 

## 2017-01-01 NOTE — Progress Notes (Signed)
Adult Psychoeducational Group Note  Date:  01/01/2017 Time:  2:21 AM  Group Topic/Focus:  Wrap-Up Group:   The focus of this group is to help patients review their daily goal of treatment and discuss progress on daily workbooks.  Participation Level:  Did Not Attend  Participation Quality:  Did Not Attend  Affect:  Did not attend  Cognitive:  Did not attend  Insight: None  Engagement in Group:  Did not attend  Modes of Intervention:  Did not attend  Additional Comments:  Pt did not attend evening wrap up group this evening.  [name] 01/01/2017, 2:21 AM

## 2017-01-01 NOTE — [facility] Group Notes (Signed)
[facility] LCSW Group Therapy  01/01/2017  1:05 PM  Type of Therapy:  Group therapy  Participation Level:  Active  Participation Quality:  Attentive  Affect:  Flat  Cognitive:  Oriented  Insight:  Limited  Engagement in Therapy:  Limited  Modes of Intervention:  Discussion, Socialization  Summary of Progress/Problems:  Chaplain was here to lead a group on themes of hope and courage. Invited.  In bed asleep.  [name] 01/01/2017 1:54 PM

## 2017-01-01 NOTE — Plan of Care (Signed)
Problem: Safety: Goal: Periods of time without injury will increase Outcome: Progressing Pt safe on the unit at this time   

## 2017-01-02 LAB — GLUCOSE, CAPILLARY
GLUCOSE-CAPILLARY: 319 mg/dL — AB (ref 65–99)
Glucose-Capillary: 144 mg/dL — ABNORMAL HIGH (ref 65–99)
Glucose-Capillary: 118 mg/dL — ABNORMAL HIGH (ref 65–99)
Glucose-Capillary: 210 mg/dL — ABNORMAL HIGH (ref 65–99)

## 2017-01-02 MED ORDER — BENZTROPINE MESYLATE 1 MG PO TABS
1.0000 mg | ORAL_TABLET | Freq: Two times a day (BID) | ORAL | Status: DC
  Administered 2017-01-02 – 2017-01-15 (×25): 1 mg via ORAL
  Filled 2017-01-02 (×28): qty 1

## 2017-01-02 MED ORDER — CITALOPRAM HYDROBROMIDE 10 MG PO TABS
5.0000 mg | ORAL_TABLET | Freq: Every day | ORAL | Status: DC
  Administered 2017-01-02 – 2017-01-03 (×2): 5 mg via ORAL
  Filled 2017-01-02 (×4): qty 1

## 2017-01-02 MED ORDER — FLUVOXAMINE MALEATE 50 MG PO TABS
50.0000 mg | ORAL_TABLET | Freq: Every day | ORAL | Status: DC
  Administered 2017-01-02: 50 mg via ORAL
  Filled 2017-01-02 (×3): qty 1

## 2017-01-02 MED ORDER — CITALOPRAM HYDROBROMIDE 10 MG PO TABS
ORAL_TABLET | ORAL | Status: AC
  Filled 2017-01-02: qty 1

## 2017-01-02 MED ORDER — TRAZODONE HCL 150 MG PO TABS
150.0000 mg | ORAL_TABLET | Freq: Every day | ORAL | Status: DC
Start: 2017-01-02 — End: 2017-01-03
  Administered 2017-01-02: 150 mg via ORAL
  Filled 2017-01-02 (×3): qty 1

## 2017-01-02 NOTE — Plan of Care (Signed)
Problem: Activity: Goal: Interest or engagement in activities will improve Outcome: Not Progressing Isolates to room - stayed in dayroom after bath at the direction of staff Goal: Sleeping patterns will improve Outcome: Not Progressing Awake most of night and asleep most of day  Problem: Education: Goal: Emotional status will improve Outcome: Progressing Denies SI, flat affect and withdrawn  Problem: Coping: Goal: Ability to demonstrate self-control will improve Outcome: Progressing No behavioral dyscontrol displayed  Problem: Health Behavior/Discharge Planning: Goal: Identification of resources available to assist in meeting health care needs will improve Outcome: Not Progressing Patient unable to identify needed resources Goal: Compliance with treatment plan for underlying cause of condition will improve Outcome: Progressing Complies with medication regimen.  Problem: Physical Regulation: Goal: Ability to maintain clinical measurements within normal limits will improve Outcome: Progressing Blood sugar readings are improving  Problem: Education: Goal: Will be free of psychotic symptoms Outcome: Progressing Denies hallucinations, continues with negative symptoms  Problem: Coping: Goal: Ability to verbalize feelings will improve Outcome: Not Progressing Does not verbalize feelings and unable to articulate when asked directly  Problem: Health Behavior/Discharge Planning: Goal: Compliance with prescribed medication regimen will improve Outcome: Progressing Takes medications as prescribed  Problem: Role Relationship: Goal: Ability to interact with others will improve Outcome: Not Progressing Withdrawn and does not initiate interaction  Problem: Safety: Goal: Ability to redirect hostility and anger into socially appropriate behaviors will improve Outcome: Progressing Has not exhibited hostility or anger  Problem: Self-Care: Goal: Ability to participate in self-care  as condition permits will improve Outcome: Not Progressing Does not perform basic hygiene tasks, must be supervised bathing and changing clothes. Malodorous. Denies incontinence but strong smell of urine. Compliant with staff direction in bathing. Room cleaned and linens changed by staff.  Problem: Self-Concept: Goal: Ability to verbalize positive feelings about self will improve Outcome: Not Progressing No positive feelings verbalized  Problem: Activity: Goal: Interest or engagement in leisure activities will improve Outcome: Progressing In dayroom when prompted by staff, isolates to room otherwise.  Problem: Health Behavior/Discharge Planning: Goal: Compliance with therapeutic regimen will improve Outcome: Progressing Compliant with medication regimen  Problem: Role Relationship: Goal: Ability to demonstrate positive changes in social behaviors and relationships will improve Outcome: Not Progressing No evidence of social behaviors and relationships

## 2017-01-02 NOTE — [facility] Group Notes (Signed)
  [facility] LCSW Group Therapy Note  Date/Time:  01/02/2017 11:15AM-12:00PM  Type of Therapy and Topic:  Group Therapy:  Feelings About Hospitalization  Participation Level:  Did Not Attend   Description of Group This process group involved patients discussing their feelings related to being hospitalized, as well as the benefits they see to being in the hospital.  These feelings and benefits were itemized.  The group then brainstormed specific ways in which they could seek those same benefits when they discharge and return home.  Therapeutic Goals 1. Patient will identify and describe positive and negative feelings related to hospitalization 2. Patient will verbalize benefits of hospitalization to themselves personally 3. Patients will brainstorm together ways they can obtain similar benefits in the outpatient setting, identify barriers to wellness and possible solutions  Summary of Patient Progress:  The patient expressed his primary feelings about being hospitalized are [name] - did not attend   Therapeutic Modalities Cognitive Behavioral Therapy Motivational Interviewing    Ambrose Mantle, LCSW 01/02/2017, 1:46 PM

## 2017-01-02 NOTE — Progress Notes (Signed)
[facility] Group Notes:  (Nursing/MHT/Case Management/Adjunct)  Date:  01/02/2017  Time:  9:26 PM  Type of Therapy:  Psychoeducational Skills  Participation Level:  Active  Participation Quality:  Attentive  Affect:  Appropriate  Cognitive:  Appropriate  Insight:  Appropriate  Engagement in Group:  Developing/Improving  Modes of Intervention:  Education  Summary of Progress/Problems: The patient states that he had a good day since he is beginning to feel more relaxed and is "calming down". As for the theme of the day, his coping skill will be to play video games.   Hazle Coca S 01/02/2017, 9:26 PM

## 2017-01-02 NOTE — Progress Notes (Signed)
  D: Pt was laying in bed during the assessment. Writer found it difficult to understand the pt partially because his voice was very soft. Pt was alert but not oriented to year, or month. Pt did acknowledge being at "[facility]".  Pt has no questions or concerns.    A:  Support and encouragement was offered. 15 min checks continued for safety.  R: Pt remains safe.

## 2017-01-02 NOTE — Progress Notes (Signed)
[facility] MD Progress Note  01/02/2017 1:01 PM Tony Cunningham  MRN:  [id number]   Subjective: Patient states " I am fine.'   Objective: Patient seen and chart reviewed.Discussed patient with treatment team.  Pt seen with a lot of negative sx. Pt on small dose of clozaril , however has secondary parkinsonian syndrome , has cogwheeling rigidity as well as tremors . Pt also continues to have negative sx ,is often withdrawn , limited participation in milieu. Pt is alert , knows time and situation. Per RN continues to need support to take care of ADLs. Will continue treatment.    Principal Problem: Schizoaffective disorder, bipolar type (HCC) Diagnosis:   Patient Active Problem List   Diagnosis Date Noted  . 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 Schizoaffective disorder, bipolar type (HCC) [F25.0] 12/22/2016  . Schizoaffective disorder, depressive type (HCC) [F25.1] 09/23/2016  . Polysubstance abuse [F19.10] 07/17/2016  . Tardive dyskinesia [G24.01] 07/16/2016  . Tobacco use disorder [F17.200] 07/07/2016  . Dyslipidemia [E78.5] 07/07/2016  . Asthma [J45.909] 07/07/2016  . HTN (hypertension) [I10] 07/06/2016  . Diabetes (HCC) [E11.9] 12/25/2010   Total Time spent with patient: 25 minutes  Past Psychiatric History: Please see H&P.   Past Medical History:  Past Medical History:  Diagnosis Date  . Anxiety   . Asthma   . Diabetes mellitus   . High blood pressure   . Sinus complaint    History reviewed. No pertinent surgical history. Family History: Please see H&P.  Family Psychiatric  History: Please see H&P.  Social History:  History  Alcohol Use No     History  Drug Use No    Social History   Social History  . Marital status: Single    Spouse name: [name]  . Number of children: [name]  . Years of education: [name]   Social History Main Topics  . Smoking status: Current Every Day Smoker    Packs/day: 0.50    Types: Cigarettes  . Smokeless tobacco:  Never Used  . Alcohol use No  . Drug use: No  . Sexual activity: Not Currently   Other Topics Concern  . None   Social History Narrative  . None   Additional Social History:   Sleep: Fair  Appetite:  Fair  Current Medications: Current Facility-Administered Medications  Medication Dose Route Frequency Provider Last Rate Last Dose  . acetaminophen (TYLENOL) tablet 650 mg  650 mg Oral Q6H PRN Charm Rings, NP   650 mg at 12/31/16 1710  . alum & mag hydroxide-simeth (MAALOX/MYLANTA) 200-200-20 MG/5ML suspension 30 mL  30 mL Oral Q6H PRN Charm Rings, NP      . amantadine (SYMMETREL) capsule 100 mg  100 mg Oral BID Jomarie Longs, MD   100 mg at 01/02/17 0833  . benztropine (COGENTIN) tablet 1 mg  1 mg Oral BID Kareemah Grounds, MD      . citalopram (CELEXA) tablet 5 mg  5 mg Oral Daily Yazmeen Woolf, MD   5 mg at 01/02/17 1300  . cloZAPine (CLOZARIL) tablet 150 mg  150 mg Oral QHS Oneta Rack, NP   150 mg at 01/01/17 2142  . fluvoxaMINE (LUVOX) tablet 50 mg  50 mg Oral QHS Telena Peyser, MD      . ibuprofen (ADVIL,MOTRIN) tablet 600 mg  600 mg Oral Q8H PRN Nelly Rout, MD      . insulin aspart (novoLOG) injection 0-15 Units  0-15 Units Subcutaneous TID WC Shaune Pollack Herminio Heads, NP  2 Units at 01/02/17 8250  . insulin aspart (novoLOG) injection 0-5 Units  0-5 Units Subcutaneous QHS Charm Rings, NP   3 Units at 12/31/16 2107  . insulin aspart (novoLOG) injection 4 Units  4 Units Subcutaneous TID WC Jomarie Longs, MD   4 Units at 01/02/17 1215  . insulin glargine (LANTUS) injection 45 Units  45 Units Subcutaneous QHS Armandina Stammer I, NP   45 Units at 01/01/17 2144  . lisinopril (PRINIVIL,ZESTRIL) tablet 20 mg  20 mg Oral Daily Charm Rings, NP   20 mg at 01/02/17 0370  . magnesium hydroxide (MILK OF MAGNESIA) suspension 30 mL  30 mL Oral Daily PRN Charm Rings, NP      . metFORMIN (GLUCOPHAGE) tablet 1,000 mg  1,000 mg Oral BID WC Charm Rings, NP   1,000 mg at  01/02/17 4888  . montelukast (SINGULAIR) tablet 10 mg  10 mg Oral QHS Charm Rings, NP   10 mg at 01/01/17 2142  . nicotine polacrilex (NICORETTE) gum 2 mg  2 mg Oral PRN Alayasia Breeding, Levin Bacon, MD      . ondansetron (ZOFRAN) tablet 4 mg  4 mg Oral Q8H PRN Charm Rings, NP      . pantoprazole (PROTONIX) EC tablet 40 mg  40 mg Oral Daily Charm Rings, NP   40 mg at 01/02/17 9169  . polyethylene glycol (MIRALAX / GLYCOLAX) packet 17 g  17 g Oral Daily Charm Rings, NP   17 g at 12/29/16 0820  . propranolol (INDERAL) tablet 20 mg  20 mg Oral BID Charm Rings, NP   20 mg at 01/02/17 4503  . simvastatin (ZOCOR) tablet 20 mg  20 mg Oral q1800 Cobos, Rockey Situ, MD   20 mg at 01/01/17 1733  . traZODone (DESYREL) tablet 100 mg  100 mg Oral QHS Admire Bunnell, Levin Bacon, MD   100 mg at 01/01/17 2141   Lab Results:  Results for orders placed or performed during the hospital encounter of 12/22/16 (from the past 48 hour(s))  Glucose, capillary     Status: Abnormal   Collection Time: 12/31/16  5:04 PM  Result Value Ref Range   Glucose-Capillary 254 (H) 65 - 99 mg/dL  Glucose, capillary     Status: Abnormal   Collection Time: 12/31/16  8:40 PM  Result Value Ref Range   Glucose-Capillary 287 (H) 65 - 99 mg/dL  Glucose, capillary     Status: Abnormal   Collection Time: 01/01/17  6:16 AM  Result Value Ref Range   Glucose-Capillary 105 (H) 65 - 99 mg/dL  Glucose, capillary     Status: Abnormal   Collection Time: 01/01/17 11:55 AM  Result Value Ref Range   Glucose-Capillary 376 (H) 65 - 99 mg/dL  Glucose, capillary     Status: Abnormal   Collection Time: 01/01/17  5:09 PM  Result Value Ref Range   Glucose-Capillary 324 (H) 65 - 99 mg/dL  Glucose, capillary     Status: Abnormal   Collection Time: 01/01/17  8:33 PM  Result Value Ref Range   Glucose-Capillary 184 (H) 65 - 99 mg/dL  Glucose, capillary     Status: Abnormal   Collection Time: 01/02/17  5:49 AM  Result Value Ref Range   Glucose-Capillary  144 (H) 65 - 99 mg/dL  Glucose, capillary     Status: Abnormal   Collection Time: 01/02/17 12:09 PM  Result Value Ref Range   Glucose-Capillary 118 (H) 65 - 99 mg/dL  Blood Alcohol level:  Lab Results  Component Value Date   [facility] <5 12/21/2016   ETH <5 10/21/2016   Metabolic Disorder Labs: Lab Results  Component Value Date   HGBA1C 10.3 (H) 11/09/2016   MPG 249 11/09/2016   MPG 275 10/09/2016   Lab Results  Component Value Date   PROLACTIN 24.5 (H) 09/24/2016   PROLACTIN 3.4 (L) 07/07/2016   Lab Results  Component Value Date   CHOL 129 12/28/2016   TRIG 78 12/28/2016   HDL 54 12/28/2016   CHOLHDL 2.4 12/28/2016   VLDL 16 12/28/2016   LDLCALC 59 12/28/2016   LDLCALC 42 11/09/2016   Physical Findings: AIMS: Facial and Oral Movements Muscles of Facial Expression: None, normal Lips and Perioral Area: None, normal Jaw: None, normal Tongue: None, normal,Extremity Movements Upper (arms, wrists, hands, fingers): Mild Lower (legs, knees, ankles, toes): None, normal, Trunk Movements Neck, shoulders, hips: None, normal, Overall Severity Severity of abnormal movements (highest score from questions above): Mild Incapacitation due to abnormal movements: Minimal Patient's awareness of abnormal movements (rate only patient's report): Aware, no distress, Dental Status Current problems with teeth and/or dentures?: No Does patient usually wear dentures?: No  CIWA:  CIWA-Ar Total: 4 COWS:  COWS Total Score: 4  Musculoskeletal: Strength & Muscle Tone: within normal limits Gait & Station: normal Patient leans: [name]  Psychiatric Specialty Exam: Physical Exam  Nursing note and vitals reviewed.   Review of Systems  Psychiatric/Behavioral: The patient is nervous/anxious.   All other systems reviewed and are negative.   Blood pressure 104/69, pulse 87, temperature 98.9 F (37.2 C), temperature source Oral, resp. rate 18, height 6' (1.829 m), weight 81.4 kg (179 lb 8 oz), SpO2 100  %.Body mass index is 24.34 kg/m.  General Appearance: Guarded  Eye Contact:  Minimal  Speech:  Slow  Volume:  Decreased  Mood:  Anxious  Affect:  Constricted  Thought Process:  Linear and Descriptions of Associations: Circumstantial  Orientation:  Other:  situation , place  Thought Content:  Paranoid Ideation  Suicidal Thoughts:  No  Homicidal Thoughts:  No  Memory:  Immediate;   Fair Recent;   Fair Remote;   Fair  Judgement:  Impaired  Insight:  Fair  Psychomotor Activity:  Restlessness and Tremor  Concentration:  Concentration: Fair and Attention Span: Fair  Recall:  [employer] of Knowledge:  Fair  Language:  Fair  Akathisia:  No  Handed:  Right  AIMS (if indicated):   5 , cog wheeling , tremors  Assets:  Desire for Improvement Resilience Social Support  ADL's:  Intact  Cognition:  WNL  Sleep:  Number of Hours: 1   Treatment Plan Summary: Patient with schizoaffective do, past hx of ECT , continues to have negative sx as well as ADR to medications , will change his Luvox to Celexa and request labs.  Continue treatment.  Will continue today 01/02/17 plan as below except where it is noted.   Daily contact with patient to assess and evaluate symptoms and progress in treatment and Medication management   -Continue treatment plan and medication management as below:  -Continue Clozaril 150 mg po qhs for psychosis. Clozaril level pending. Request Pharmacy consult for clozaril therapy. Order CBC with diff. -Increase Trazodone to 150 mg po qhs for sleep. -Continue Amantadine 100 mg po bid for neuroleptic induced tremors. -Increase Cogentin to 1 mg po bid for EPS. -Cross taper Luvox with Celexa for affective sx. -Continue Propranolol 20 mg po bid  for tremors, anxiety. -Continue home medications as indicated. Will continue to monitor vitals ,medication compliance and treatment side effects  Will monitor for medical issues as well as call consult as needed.  Reviewed labs  tsh -wnl, vitamin b12 - wnl.  CSW will continue working on disposition.  Patient encourage to participate in therapeutic milieu .   Jomarie Longs, MD, 01/02/2017, 1:01 PM  Patient ID: Tony Cunningham, male   DOB: [birth year], 41 y.o.   MRN: [id number]

## 2017-01-03 LAB — GLUCOSE, CAPILLARY
GLUCOSE-CAPILLARY: 169 mg/dL — AB (ref 65–99)
Glucose-Capillary: 118 mg/dL — ABNORMAL HIGH (ref 65–99)
Glucose-Capillary: 153 mg/dL — ABNORMAL HIGH (ref 65–99)
Glucose-Capillary: 387 mg/dL — ABNORMAL HIGH (ref 65–99)

## 2017-01-03 MED ORDER — CITALOPRAM HYDROBROMIDE 10 MG PO TABS
10.0000 mg | ORAL_TABLET | Freq: Every day | ORAL | Status: DC
  Administered 2017-01-04: 10 mg via ORAL
  Filled 2017-01-03 (×2): qty 1

## 2017-01-03 MED ORDER — TEMAZEPAM 7.5 MG PO CAPS
7.5000 mg | ORAL_CAPSULE | Freq: Every day | ORAL | Status: DC
  Administered 2017-01-03 – 2017-01-14 (×12): 7.5 mg via ORAL
  Filled 2017-01-03 (×12): qty 1

## 2017-01-03 NOTE — Progress Notes (Signed)
More isolative today, staying in room. Said "I don't feel well" but was not able to articulate in what way he did not feel well. Denied stomach upset or dizziness. Blood sugar before lunch was 118 which did not require sliding scale but 4 units meal coverage given, then patient refused to go to lunch and went to sleep instead. Awakened and encouraged to eat, ate half of tray and drank soda. Continues to isolate, responds to voice at checks.

## 2017-01-03 NOTE — [facility] Group Notes (Signed)
[facility] LCSW Group Therapy Note  Date/Time:  01/03/2017  11:00AM-12:00PM  Type of Therapy and Topic:  Group Therapy:  Music and Mood  Participation Level:  Did Not Attend   Description of Group: In this process group, members listened to a variety of genres of music and identified that different types of music evoke different responses.  Patients were encouraged to identify music that was soothing for them and music that was energizing for them.  Patients discussed how this knowledge can help with wellness and recovery in various ways including managing depression and anxiety as well as encouraging healthy sleep habits.    Therapeutic Goals: 1. Patients will explore the impact of different varieties of music on mood 2. Patients will verbalize the thoughts they have when listening to different types of music 3. Patients will identify music that is soothing to them as well as music that is energizing to them 4. Patients will discuss how to use this knowledge to assist in maintaining wellness and recovery 5. Patients will explore the use of music as a coping skill  Summary of Patient Progress:  [name]  Therapeutic Modalities: Solution Focused Brief Therapy Motivational Interviewing Activity   Sakina Briones Grossman-Orr, LCSW 01/03/2017 1:21 PM    

## 2017-01-03 NOTE — Plan of Care (Signed)
Problem: Safety: Goal: Ability to remain free from injury will improve Outcome: Progressing Pt remains free from harm tonight

## 2017-01-03 NOTE — Progress Notes (Signed)
Patient ID: Tony Cunningham, male   DOB: 26-Nov-1975, 41 y.o.   MRN: [id number]  Pt currently presents with a blunted affect and guarded behavior. Body odor of urine noticeable from inside the medication window. Pt forwards little to writer, during discussion patients affect brightens. Pt states "I like it better here than at [place] or [facility]" Pt reports good sleep with current medication regimen. Adheres to medication regimen tonight.  Pt provided with medications per providers orders. Pt's labs and vitals were monitored throughout the night. Pt given a 1:1 about emotional and mental status. Pt supported and encouraged to express concerns and questions. Pt educated on medications.  Pt's safety ensured with 15 minute and environmental checks. Pt currently denies SI and A/V hallucinations. Hesitates and does not verbally deny HI. Pt verbally agrees to seek staff if SI/HI or A/VH occurs and to consult with staff before acting on any harmful thoughts. Will continue POC.

## 2017-01-03 NOTE — Progress Notes (Addendum)
[facility] MD Progress Note  01/03/2017 12:32 PM Tony Cunningham  MRN:  [id number]   Subjective:Patient states " I am not sleeping."    Objective: Patient seen and chart reviewed.Discussed patient with treatment team.  Pt seen on his bed , dozing off often during conversation, continues to have negative sx, and needs support and prompting to take care of his ADLs. Pt reports that he feels that he is aching all over , but unable to elaborate further. Pt did take his breakfast , denies any chills , nausea, diarrhea and other sx. Per staff , pt continues to need support .     Principal Problem: Schizoaffective disorder, bipolar type (HCC) Diagnosis:   Patient Active Problem List   Diagnosis Date Noted  . 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 Schizoaffective disorder, bipolar type (HCC) [F25.0] 12/22/2016  . Schizoaffective disorder, depressive type (HCC) [F25.1] 09/23/2016  . Polysubstance abuse [F19.10] 07/17/2016  . Tardive dyskinesia [G24.01] 07/16/2016  . Tobacco use disorder [F17.200] 07/07/2016  . Dyslipidemia [E78.5] 07/07/2016  . Asthma [J45.909] 07/07/2016  . HTN (hypertension) [I10] 07/06/2016  . Diabetes (HCC) [E11.9] 12/25/2010   Total Time spent with patient: 25 minutes  Past Psychiatric History: Please see H&P.   Past Medical History:  Past Medical History:  Diagnosis Date  . Anxiety   . Asthma   . Diabetes mellitus   . High blood pressure   . Sinus complaint    History reviewed. No pertinent surgical history. Family History: Please see H&P.  Family Psychiatric  History: Please see H&P.  Social History:  History  Alcohol Use No     History  Drug Use No    Social History   Social History  . Marital status: Single    Spouse name: [name]  . Number of children: [name]  . Years of education: [name]   Social History Main Topics  . Smoking status: Current Every Day Smoker    Packs/day: 0.50    Types: Cigarettes  . Smokeless tobacco: Never  Used  . Alcohol use No  . Drug use: No  . Sexual activity: Not Currently   Other Topics Concern  . None   Social History Narrative  . None   Additional Social History:   Sleep: Poor  Appetite:  Fair  Current Medications: Current Facility-Administered Medications  Medication Dose Route Frequency Provider Last Rate Last Dose  . acetaminophen (TYLENOL) tablet 650 mg  650 mg Oral Q6H PRN Charm Rings, NP   650 mg at 12/31/16 1710  . alum & mag hydroxide-simeth (MAALOX/MYLANTA) 200-200-20 MG/5ML suspension 30 mL  30 mL Oral Q6H PRN Charm Rings, NP      . amantadine (SYMMETREL) capsule 100 mg  100 mg Oral BID Jomarie Longs, MD   100 mg at 01/03/17 0818  . benztropine (COGENTIN) tablet 1 mg  1 mg Oral BID Jomarie Longs, MD   1 mg at 01/03/17 0818  . citalopram (CELEXA) tablet 5 mg  5 mg Oral Daily Keyauna Graefe, MD   5 mg at 01/03/17 1142  . cloZAPine (CLOZARIL) tablet 150 mg  150 mg Oral QHS Oneta Rack, NP   150 mg at 01/02/17 2146  . fluvoxaMINE (LUVOX) tablet 50 mg  50 mg Oral QHS Jomarie Longs, MD   50 mg at 01/02/17 2146  . ibuprofen (ADVIL,MOTRIN) tablet 600 mg  600 mg Oral Q8H PRN Nelly Rout, MD      . insulin aspart (novoLOG) injection 0-15 Units  0-15  Units Subcutaneous TID WC Charm Rings, NP   3 Units at 01/03/17 [phone]  . insulin aspart (novoLOG) injection 0-5 Units  0-5 Units Subcutaneous QHS Charm Rings, NP   2 Units at 01/02/17 2147  . insulin aspart (novoLOG) injection 4 Units  4 Units Subcutaneous TID WC Jomarie Longs, MD   4 Units at 01/03/17 1142  . insulin glargine (LANTUS) injection 45 Units  45 Units Subcutaneous QHS Armandina Stammer I, NP   45 Units at 01/02/17 2146  . lisinopril (PRINIVIL,ZESTRIL) tablet 20 mg  20 mg Oral Daily Charm Rings, NP   20 mg at 01/03/17 0818  . magnesium hydroxide (MILK OF MAGNESIA) suspension 30 mL  30 mL Oral Daily PRN Charm Rings, NP      . metFORMIN (GLUCOPHAGE) tablet 1,000 mg  1,000 mg Oral BID WC  Charm Rings, NP   1,000 mg at 01/03/17 0818  . montelukast (SINGULAIR) tablet 10 mg  10 mg Oral QHS Charm Rings, NP   10 mg at 01/02/17 2146  . nicotine polacrilex (NICORETTE) gum 2 mg  2 mg Oral PRN Jomarie Longs, MD      . ondansetron (ZOFRAN) tablet 4 mg  4 mg Oral Q8H PRN Charm Rings, NP      . pantoprazole (PROTONIX) EC tablet 40 mg  40 mg Oral Daily Charm Rings, NP   40 mg at 01/03/17 0818  . polyethylene glycol (MIRALAX / GLYCOLAX) packet 17 g  17 g Oral Daily Charm Rings, NP   17 g at 12/29/16 0820  . propranolol (INDERAL) tablet 20 mg  20 mg Oral BID Charm Rings, NP   20 mg at 01/03/17 0818  . simvastatin (ZOCOR) tablet 20 mg  20 mg Oral q1800 Cobos, Rockey Situ, MD   20 mg at 01/02/17 1829  . traZODone (DESYREL) tablet 150 mg  150 mg Oral QHS Quinlan Vollmer, Levin Bacon, MD   150 mg at 01/02/17 2146   Lab Results:  Results for orders placed or performed during the hospital encounter of 12/22/16 (from the past 48 hour(s))  Glucose, capillary     Status: Abnormal   Collection Time: 01/01/17  5:09 PM  Result Value Ref Range   Glucose-Capillary 324 (H) 65 - 99 mg/dL  Glucose, capillary     Status: Abnormal   Collection Time: 01/01/17  8:33 PM  Result Value Ref Range   Glucose-Capillary 184 (H) 65 - 99 mg/dL  Glucose, capillary     Status: Abnormal   Collection Time: 01/02/17  5:49 AM  Result Value Ref Range   Glucose-Capillary 144 (H) 65 - 99 mg/dL  Glucose, capillary     Status: Abnormal   Collection Time: 01/02/17 12:09 PM  Result Value Ref Range   Glucose-Capillary 118 (H) 65 - 99 mg/dL  Glucose, capillary     Status: Abnormal   Collection Time: 01/02/17  5:07 PM  Result Value Ref Range   Glucose-Capillary 319 (H) 65 - 99 mg/dL  Glucose, capillary     Status: Abnormal   Collection Time: 01/02/17  8:20 PM  Result Value Ref Range   Glucose-Capillary 210 (H) 65 - 99 mg/dL   Comment 1 Notify RN   Glucose, capillary     Status: Abnormal   Collection Time:  01/03/17  6:02 AM  Result Value Ref Range   Glucose-Capillary 169 (H) 65 - 99 mg/dL   Comment 1 Notify RN   Glucose, capillary  Status: Abnormal   Collection Time: 01/03/17 11:32 AM  Result Value Ref Range   Glucose-Capillary 118 (H) 65 - 99 mg/dL   Blood Alcohol level:  Lab Results  Component Value Date   ETH <5 12/21/2016   ETH <5 10/21/2016   Metabolic Disorder Labs: Lab Results  Component Value Date   HGBA1C 10.3 (H) 11/09/2016   MPG 249 11/09/2016   MPG 275 10/09/2016   Lab Results  Component Value Date   PROLACTIN 24.5 (H) 09/24/2016   PROLACTIN 3.4 (L) 07/07/2016   Lab Results  Component Value Date   CHOL 129 12/28/2016   TRIG 78 12/28/2016   HDL 54 12/28/2016   CHOLHDL 2.4 12/28/2016   VLDL 16 12/28/2016   LDLCALC 59 12/28/2016   LDLCALC 42 11/09/2016   Physical Findings: AIMS: Facial and Oral Movements Muscles of Facial Expression: None, normal Lips and Perioral Area: None, normal Jaw: None, normal Tongue: None, normal,Extremity Movements Upper (arms, wrists, hands, fingers): Mild Lower (legs, knees, ankles, toes): None, normal, Trunk Movements Neck, shoulders, hips: None, normal, Overall Severity Severity of abnormal movements (highest score from questions above): Mild Incapacitation due to abnormal movements: Minimal Patient's awareness of abnormal movements (rate only patient's report): Aware, no distress, Dental Status Current problems with teeth and/or dentures?: No Does patient usually wear dentures?: No  CIWA:  CIWA-Ar Total: 4 COWS:  COWS Total Score: 4  Musculoskeletal: Strength & Muscle Tone: within normal limits Gait & Station: normal Patient leans: [name]  Psychiatric Specialty Exam: Physical Exam  Nursing note and vitals reviewed.   Review of Systems  Psychiatric/Behavioral: The patient is nervous/anxious and has insomnia.   All other systems reviewed and are negative.   Blood pressure 108/67, pulse 96, temperature 98.8 F (37.1  C), temperature source Oral, resp. rate 16, height 6' (1.829 m), weight 81.4 kg (179 lb 8 oz), SpO2 100 %.Body mass index is 24.34 kg/m.  General Appearance: Guarded  Eye Contact:  Minimal  Speech:  Slow  Volume:  Decreased  Mood:  Anxious  Affect:  Constricted  Thought Process:  Linear and Descriptions of Associations: Circumstantial  Orientation:  Other:  situation, self  Thought Content:  Paranoid Ideation  Suicidal Thoughts:  No  Homicidal Thoughts:  No  Memory:  Immediate;   Fair Recent;   Fair Remote;   Fair  Judgement:  Impaired  Insight:  Fair  Psychomotor Activity:  Restlessness and Tremor  Concentration:  Concentration: Fair and Attention Span: Fair  Recall:  [employer] of Knowledge:  Fair  Language:  Fair  Akathisia:  No  Handed:  Right  AIMS (if indicated):   5, cogwheeling rigidity, tremors , pt reports no distress  Assets:  Desire for Improvement Resilience Social Support  ADL's:  Intact  Cognition:  WNL  Sleep:  Number of Hours: 4.75   Treatment Plan Summary: Patient with schizoaffective do , hx of past ECT treatments, continues to have sleep issues , negative sx , continue to need medication changes.  Will continue today 01/03/17  plan as below except where it is noted.  Daily contact with patient to assess and evaluate symptoms and progress in treatment and Medication management   -Continue treatment plan and medication management as below:  -Continue Clozaril 150 mg po qhs for psychosis. Clozaril level and CBC with diff ordered for 01/04/17.) Per pharmacy consult. -Will discontinue Trazodone , start Restoril 7.5 mg po qhs for sleep. -Continue Amantadine 100 mg po bid for neuroleptic induced tremors. -  Increase Cogentin to 1 mg po bid for EPS. -Continue to crosstaper Luvox with Celexa for affective sx. -Continue Propranolol 20 mg po bid for tremors, anxiety. -Continue home medications as indicated. Will continue to monitor vitals ,medication compliance  and treatment side effects  Will monitor for medical issues as well as call consult as needed.  Reviewed labs tsh -wnl, vitamin b12 - wnl.  CSW will continue working on disposition.  Patient encourage to participate in therapeutic milieu .   Jomarie Longs, MD, 01/03/2017, 12:32 PM  Patient ID: Tony Cunningham, male   DOB: 1975/06/02, 41 y.o.   MRN: [id number]

## 2017-01-03 NOTE — Progress Notes (Signed)
Psychoeducational Group Note  Date:  01/03/2017 Time:  2119  Group Topic/Focus:  Wrap-Up Group:   The focus of this group is to help patients review their daily goal of treatment and discuss progress on daily workbooks.  Participation Level: Did Not Attend  Participation Quality:  Not Applicable  Affect:  Not Applicable  Cognitive:  Not Applicable  Insight:  Not Applicable  Engagement in Group: Not Applicable  Additional Comments:  The patient did not attend group this evening.   Hazle Coca S 01/03/2017, 9:19 PM

## 2017-01-04 LAB — CBC WITH DIFFERENTIAL/PLATELET
BASOS ABS: 0 10*3/uL (ref 0.0–0.1)
Basophils Relative: 1 %
EOS ABS: 0.3 10*3/uL (ref 0.0–0.7)
EOS PCT: 4 %
HCT: 40.3 % (ref 39.0–52.0)
Hemoglobin: 13.8 g/dL (ref 13.0–17.0)
LYMPHS ABS: 3.2 10*3/uL (ref 0.7–4.0)
Lymphocytes Relative: 45 %
MCH: 30.7 pg (ref 26.0–34.0)
MCHC: 34.2 g/dL (ref 30.0–36.0)
MCV: 89.6 fL (ref 78.0–100.0)
Monocytes Absolute: 0.5 10*3/uL (ref 0.1–1.0)
Monocytes Relative: 7 %
Neutro Abs: 3 10*3/uL (ref 1.7–7.7)
Neutrophils Relative %: 43 %
PLATELETS: 245 10*3/uL (ref 150–400)
RBC: 4.5 MIL/uL (ref 4.22–5.81)
RDW: 13.2 % (ref 11.5–15.5)
WBC: 7.1 10*3/uL (ref 4.0–10.5)

## 2017-01-04 LAB — GLUCOSE, CAPILLARY
GLUCOSE-CAPILLARY: 98 mg/dL (ref 65–99)
GLUCOSE-CAPILLARY: 174 mg/dL — AB (ref 65–99)
GLUCOSE-CAPILLARY: 157 mg/dL — AB (ref 65–99)
GLUCOSE-CAPILLARY: 231 mg/dL — AB (ref 65–99)

## 2017-01-04 MED ORDER — CITALOPRAM HYDROBROMIDE 20 MG PO TABS
20.0000 mg | ORAL_TABLET | Freq: Every day | ORAL | Status: DC
  Administered 2017-01-05 – 2017-01-15 (×10): 20 mg via ORAL
  Filled 2017-01-04 (×12): qty 1

## 2017-01-04 NOTE — Progress Notes (Signed)
Patient ID: Tony Cunningham, male   DOB: 05-Jan-1976, 41 y.o.   MRN: [id number] PER STATE REGULATIONS 482.30  THIS CHART WAS REVIEWED FOR MEDICAL NECESSITY WITH RESPECT TO THE PATIENT'S ADMISSION/ DURATION OF STAY.  NEXT REVIEW DATE: 01/07/2017  Willa Rough, RN, BSN CASE MANAGER

## 2017-01-04 NOTE — [facility] Group Notes (Signed)
Nursing Group Note 1030  Patient was invited to attend group but did not come.

## 2017-01-04 NOTE — Plan of Care (Signed)
Problem: Activity: Goal: Interest or engagement in activities will improve Outcome: Not Progressing Patient encouraged to be visible in milieu and to attend therapy.

## 2017-01-04 NOTE — [facility] Group Notes (Signed)
[facility] LCSW Group Therapy  01/04/2017 1:15 pm  Type of Therapy: Process Group Therapy  Participation Level:  Active  Participation Quality:  Appropriate  Affect:  Flat  Cognitive:  Oriented  Insight:  Improving  Engagement in Group:  Limited  Engagement in Therapy:  Limited  Modes of Intervention:  Activity, Clarification, Education, Problem-solving and Support  Summary of Progress/Problems: Today's group addressed the issue of overcoming obstacles.  Patients were asked to identify their biggest obstacle post d/c that stands in the way of their on-going success, and then problem solve as to how to manage this. Invited.  Chose to not attend.  [name] 01/04/2017   2:57 PM

## 2017-01-04 NOTE — Progress Notes (Signed)
Did not attend group 

## 2017-01-04 NOTE — Progress Notes (Signed)
[facility] MD Progress Note  01/04/2017 2:23 PM Tony Cunningham  MRN:  [id number]   Subjective: Tony Cunningham reports, "I'm doing better, but not so good. I'm just feeling weak from the depression. I need you guys to listen to me more. The voices are not what it used to be, it is getting better".  Objective: Patient seen and chart reviewed. Discussed patient with treatment team. Pt seen on his bed sleeping, but easily aroused. His affect today is more reactive, however, he continues to have the negative sx & needs support and prompting to take care of his ADLs. Pt reports that he feels weak, coming from the depression he says. He did ask for the staff to listen to him more. He denies any chills, nausea, diarrhea and or any other new issues. His Citalopram is increased to 20 mg today. Per staff, pt continues to need support .  Principal Problem: Schizoaffective disorder, bipolar type (HCC)  Diagnosis:   Patient Active Problem List   Diagnosis Date Noted  . 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 Schizoaffective disorder, bipolar type (HCC) [F25.0] 12/22/2016  . Schizoaffective disorder, depressive type (HCC) [F25.1] 09/23/2016  . Polysubstance abuse [F19.10] 07/17/2016  . Tardive dyskinesia [G24.01] 07/16/2016  . Tobacco use disorder [F17.200] 07/07/2016  . Dyslipidemia [E78.5] 07/07/2016  . Asthma [J45.909] 07/07/2016  . HTN (hypertension) [I10] 07/06/2016  . Diabetes (HCC) [E11.9] 12/25/2010   Total Time spent with patient: 15 minutes  Past Psychiatric History: Please see H&P.   Past Medical History:  Past Medical History:  Diagnosis Date  . Anxiety   . Asthma   . Diabetes mellitus   . High blood pressure   . Sinus complaint    History reviewed. No pertinent surgical history. Family History: Please see H&P.  Family Psychiatric  History: Please see H&P.  Social History:  History  Alcohol Use No     History  Drug Use No    Social History   Social History  .  Marital status: Single    Spouse name: [name]  . Number of children: [name]  . Years of education: [name]   Social History Main Topics  . Smoking status: Current Every Day Smoker    Packs/day: 0.50    Types: Cigarettes  . Smokeless tobacco: Never Used  . Alcohol use No  . Drug use: No  . Sexual activity: Not Currently   Other Topics Concern  . None   Social History Narrative  . None   Additional Social History:   Sleep: Poor  Appetite:  Fair  Current Medications: Current Facility-Administered Medications  Medication Dose Route Frequency Provider Last Rate Last Dose  . acetaminophen (TYLENOL) tablet 650 mg  650 mg Oral Q6H PRN Charm Rings, NP   650 mg at 12/31/16 1710  . alum & mag hydroxide-simeth (MAALOX/MYLANTA) 200-200-20 MG/5ML suspension 30 mL  30 mL Oral Q6H PRN Charm Rings, NP   30 mL at 01/04/17 0820  . amantadine (SYMMETREL) capsule 100 mg  100 mg Oral BID Jomarie Longs, MD   100 mg at 01/04/17 0817  . benztropine (COGENTIN) tablet 1 mg  1 mg Oral BID Jomarie Longs, MD   1 mg at 01/04/17 0817  . citalopram (CELEXA) tablet 10 mg  10 mg Oral Daily Jomarie Longs, MD   10 mg at 01/04/17 0817  . cloZAPine (CLOZARIL) tablet 150 mg  150 mg Oral QHS Oneta Rack, NP   150 mg at 01/03/17 2148  . ibuprofen (ADVIL,MOTRIN)  tablet 600 mg  600 mg Oral Q8H PRN Nelly Rout, MD      . insulin aspart (novoLOG) injection 0-15 Units  0-15 Units Subcutaneous TID WC Charm Rings, NP   3 Units at 01/04/17 1154  . insulin aspart (novoLOG) injection 0-5 Units  0-5 Units Subcutaneous QHS Charm Rings, NP   5 Units at 01/03/17 2149  . insulin aspart (novoLOG) injection 4 Units  4 Units Subcutaneous TID WC Jomarie Longs, MD   4 Units at 01/04/17 1156  . insulin glargine (LANTUS) injection 45 Units  45 Units Subcutaneous QHS Armandina Stammer I, NP   45 Units at 01/03/17 2148  . lisinopril (PRINIVIL,ZESTRIL) tablet 20 mg  20 mg Oral Daily Charm Rings, NP   20 mg at 01/04/17 0817   . magnesium hydroxide (MILK OF MAGNESIA) suspension 30 mL  30 mL Oral Daily PRN Charm Rings, NP      . metFORMIN (GLUCOPHAGE) tablet 1,000 mg  1,000 mg Oral BID WC Charm Rings, NP   1,000 mg at 01/04/17 0817  . montelukast (SINGULAIR) tablet 10 mg  10 mg Oral QHS Charm Rings, NP   10 mg at 01/03/17 2148  . nicotine polacrilex (NICORETTE) gum 2 mg  2 mg Oral PRN Eappen, Levin Bacon, MD      . ondansetron (ZOFRAN) tablet 4 mg  4 mg Oral Q8H PRN Charm Rings, NP      . pantoprazole (PROTONIX) EC tablet 40 mg  40 mg Oral Daily Charm Rings, NP   40 mg at 01/04/17 0817  . polyethylene glycol (MIRALAX / GLYCOLAX) packet 17 g  17 g Oral Daily Charm Rings, NP   17 g at 12/29/16 0820  . propranolol (INDERAL) tablet 20 mg  20 mg Oral BID Charm Rings, NP   20 mg at 01/04/17 0817  . simvastatin (ZOCOR) tablet 20 mg  20 mg Oral q1800 Robin Petrakis, Rockey Situ, MD   20 mg at 01/03/17 1819  . temazepam (RESTORIL) capsule 7.5 mg  7.5 mg Oral QHS Eappen, Saramma, MD   7.5 mg at 01/03/17 2148   Lab Results:  Results for orders placed or performed during the hospital encounter of 12/22/16 (from the past 48 hour(s))  Glucose, capillary     Status: Abnormal   Collection Time: 01/02/17  5:07 PM  Result Value Ref Range   Glucose-Capillary 319 (H) 65 - 99 mg/dL  Glucose, capillary     Status: Abnormal   Collection Time: 01/02/17  8:20 PM  Result Value Ref Range   Glucose-Capillary 210 (H) 65 - 99 mg/dL   Comment 1 Notify RN   Glucose, capillary     Status: Abnormal   Collection Time: 01/03/17  6:02 AM  Result Value Ref Range   Glucose-Capillary 169 (H) 65 - 99 mg/dL   Comment 1 Notify RN   Glucose, capillary     Status: Abnormal   Collection Time: 01/03/17 11:32 AM  Result Value Ref Range   Glucose-Capillary 118 (H) 65 - 99 mg/dL  Glucose, capillary     Status: Abnormal   Collection Time: 01/03/17  5:02 PM  Result Value Ref Range   Glucose-Capillary 153 (H) 65 - 99 mg/dL  Glucose,  capillary     Status: Abnormal   Collection Time: 01/03/17  8:21 PM  Result Value Ref Range   Glucose-Capillary 387 (H) 65 - 99 mg/dL   Comment 1 Notify RN   Glucose, capillary  Status: None   Collection Time: 01/04/17  5:51 AM  Result Value Ref Range   Glucose-Capillary 98 65 - 99 mg/dL  CBC with Differential/Platelet     Status: None   Collection Time: 01/04/17  6:28 AM  Result Value Ref Range   WBC 7.1 4.0 - 10.5 K/uL   RBC 4.50 4.22 - 5.81 MIL/uL   Hemoglobin 13.8 13.0 - 17.0 g/dL   HCT 28.3 15.1 - 76.1 %   MCV 89.6 78.0 - 100.0 fL   MCH 30.7 26.0 - 34.0 pg   MCHC 34.2 30.0 - 36.0 g/dL   RDW 60.7 37.1 - 06.2 %   Platelets 245 150 - 400 K/uL   Neutrophils Relative % 43 %   Neutro Abs 3.0 1.7 - 7.7 K/uL   Lymphocytes Relative 45 %   Lymphs Abs 3.2 0.7 - 4.0 K/uL   Monocytes Relative 7 %   Monocytes Absolute 0.5 0.1 - 1.0 K/uL   Eosinophils Relative 4 %   Eosinophils Absolute 0.3 0.0 - 0.7 K/uL   Basophils Relative 1 %   Basophils Absolute 0.0 0.0 - 0.1 K/uL    Comment: Performed at [facility], [address] [place], [place] [address]  Glucose, capillary     Status: Abnormal   Collection Time: 01/04/17 11:49 AM  Result Value Ref Range   Glucose-Capillary 174 (H) 65 - 99 mg/dL   Comment 1 Notify RN    Comment 2 Document in Chart    Blood Alcohol level:  Lab Results  Component Value Date   ETH <5 12/21/2016   ETH <5 10/21/2016   Metabolic Disorder Labs: Lab Results  Component Value Date   HGBA1C 10.3 (H) 11/09/2016   MPG 249 11/09/2016   MPG 275 10/09/2016   Lab Results  Component Value Date   PROLACTIN 24.5 (H) 09/24/2016   PROLACTIN 3.4 (L) 07/07/2016   Lab Results  Component Value Date   CHOL 129 12/28/2016   TRIG 78 12/28/2016   HDL 54 12/28/2016   CHOLHDL 2.4 12/28/2016   VLDL 16 12/28/2016   LDLCALC 59 12/28/2016   LDLCALC 42 11/09/2016   Physical Findings: AIMS: Facial and Oral Movements Muscles of Facial Expression:  None, normal Lips and Perioral Area: None, normal Jaw: None, normal Tongue: None, normal,Extremity Movements Upper (arms, wrists, hands, fingers): Mild Lower (legs, knees, ankles, toes): None, normal, Trunk Movements Neck, shoulders, hips: None, normal, Overall Severity Severity of abnormal movements (highest score from questions above): Mild Incapacitation due to abnormal movements: Minimal Patient's awareness of abnormal movements (rate only patient's report): No Awareness, Dental Status Current problems with teeth and/or dentures?: No Does patient usually wear dentures?: No  CIWA:  CIWA-Ar Total: 4 COWS:  COWS Total Score: 4  Musculoskeletal: Strength & Muscle Tone: within normal limits Gait & Station: normal Patient leans: [name]  Psychiatric Specialty Exam: Physical Exam  Nursing note and vitals reviewed.   Review of Systems  Psychiatric/Behavioral: The patient is nervous/anxious and has insomnia.   All other systems reviewed and are negative.   Blood pressure 108/67, pulse 96, temperature 98.8 F (37.1 C), temperature source Oral, resp. rate 16, height 6' (1.829 m), weight 81.4 kg (179 lb 8 oz), SpO2 100 %.Body mass index is 24.34 kg/m.  General Appearance: Guarded  Eye Contact:  Minimal  Speech:  Slow  Volume:  Decreased  Mood:  Anxious  Affect:  Constricted  Thought Process:  Linear and Descriptions of Associations: Circumstantial  Orientation:  Other:  situation, self  Thought Content:  Paranoid Ideation  Suicidal Thoughts:  No  Homicidal Thoughts:  No  Memory:  Immediate;   Fair Recent;   Fair Remote;   Fair  Judgement:  Impaired  Insight:  Fair  Psychomotor Activity:  Restlessness and Tremor  Concentration:  Concentration: Fair and Attention Span: Fair  Recall:  [employer] of Knowledge:  Fair  Language:  Fair  Akathisia:  No  Handed:  Right  AIMS (if indicated):   5, cogwheeling rigidity, tremors , pt reports no distress  Assets:  Desire for  Improvement Resilience Social Support  ADL's:  Intact  Cognition:  WNL  Sleep:  Number of Hours: 5   Treatment Plan Summary: Patient with schizoaffective do , hx of past ECT treatments, continues to have sleep issues , negative sx , continue to need medication changes.  Will continue today 01/04/17  plan as below except where it is noted.  Daily contact with patient to assess and evaluate symptoms and progress in treatment and Medication management   -Continue treatment plan and medication management as below:  -Continue Clozaril 150 mg po qhs for psychosis. Clozaril level and CBC with diff ordered for 01/04/17.) Per pharmacy consult.  -Continue Restoril 7.5 mg po qhs for sleep.  -Continue Amantadine 100 mg po bid for neuroleptic induced tremors.  -Continue Cogentin to 1 mg po bid for EPS.  -Continue to crosstaper Luvox with increased Celexa from 10 mg to 20 mg for affective sx.  -Continue Propranolol 20 mg po bid for tremors, anxiety.  -Continue home medications as indicated.  Will continue to monitor vitals ,medication compliance and treatment side effects.   Will monitor for medical issues as well as call consult as needed.  Reviewed labs tsh -wnl, vitamin b12 - wnl.  CSW will continue working on disposition.  Patient encourage to participate in therapeutic milieu .   Sanjuana Kava, NP, 01/04/2017, 2:23 PM  Patient ID: Tony Cunningham, male   DOB: 06-07-1975, 41 y.o.   MRN: [id number] Agree with NP Progress Note

## 2017-01-04 NOTE — Progress Notes (Signed)
DAR NOTE: Patient presents with calm affect and depressed mood.  Denies auditory and visual hallucinations.  Described energy level as lowRates depression at 8, hopelessness at 6, and anxiety at 8.  Maintained on routine safety checks.  Medications given as prescribed.  Support and encouragement offered as needed.  Patient remained in his room most of this shift.  Mylanta 30 ml given for abdominal discomfort with good effect.

## 2017-01-05 LAB — CLOZAPINE (CLOZARIL)
CLOZAPINE LVL: 463 ng/mL (ref 350–650)
NorClozapine: 135 ng/mL
TOTAL(CLOZ+ NORCLOZ): 598 ng/mL

## 2017-01-05 LAB — GLUCOSE, CAPILLARY
GLUCOSE-CAPILLARY: 201 mg/dL — AB (ref 65–99)
Glucose-Capillary: 146 mg/dL — ABNORMAL HIGH (ref 65–99)
Glucose-Capillary: 240 mg/dL — ABNORMAL HIGH (ref 65–99)
Glucose-Capillary: 280 mg/dL — ABNORMAL HIGH (ref 65–99)

## 2017-01-05 MED ORDER — CHOLECALCIFEROL 10 MCG (400 UNIT) PO TABS
400.0000 [IU] | ORAL_TABLET | Freq: Every day | ORAL | Status: DC
  Administered 2017-01-05 – 2017-01-15 (×10): 400 [IU] via ORAL
  Filled 2017-01-05 (×12): qty 1

## 2017-01-05 NOTE — Tx Team (Signed)
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 acetaminophen (TYLENOL) tablet 650 mg  650 mg Oral Q6H PRN Patrecia Pour, NP   650 mg at 12/31/16 1710  . alum & mag hydroxide-simeth (MAALOX/MYLANTA) 200-200-20 MG/5ML suspension 30 mL  30 mL Oral Q6H PRN Patrecia Pour, NP   30 mL at 01/04/17 0820  . amantadine (SYMMETREL) capsule 100 mg  100 mg Oral BID Ursula Alert, MD   100 mg at 01/05/17 0819  . benztropine (COGENTIN) tablet 1 mg  1 mg Oral BID Ursula Alert, MD   1 mg at 01/05/17 0819  . citalopram (CELEXA) tablet 20 mg  20 mg Oral Daily Lindell Spar I, NP   20 mg at 01/05/17 0819  . cloZAPine (CLOZARIL) tablet 150 mg  150 mg Oral QHS Derrill Center, NP   150 mg at 01/04/17 2142  . ibuprofen (ADVIL,MOTRIN) tablet 600 mg  600 mg Oral Q8H PRN Hampton Abbot, MD      . insulin aspart (novoLOG) injection 0-15 Units  0-15 Units Subcutaneous TID WC Patrecia Pour, NP   3 Units at 01/04/17 1710  . insulin aspart (novoLOG) injection 0-5 Units  0-5 Units Subcutaneous QHS Patrecia Pour, NP   2 Units at 01/04/17 2144  . insulin aspart (novoLOG) injection 4 Units  4 Units Subcutaneous TID WC Ursula Alert, MD   4 Units at 01/04/17 1711  . insulin glargine (LANTUS) injection 45 Units  45 Units Subcutaneous QHS Lindell Spar I, NP   45 Units at 01/04/17 2144  . lisinopril (PRINIVIL,ZESTRIL) tablet 20 mg  20 mg Oral Daily Patrecia Pour, NP   20 mg at 01/05/17 0819  . magnesium hydroxide (MILK OF MAGNESIA) suspension 30 mL  30 mL Oral Daily PRN Patrecia Pour, NP      . metFORMIN  (GLUCOPHAGE) tablet 1,000 mg  1,000 mg Oral BID WC Patrecia Pour, NP   1,000 mg at 01/05/17 0819  . montelukast (SINGULAIR) tablet 10 mg  10 mg Oral QHS Patrecia Pour, NP   10 mg at 01/04/17 2142  . nicotine polacrilex (NICORETTE) gum 2 mg  2 mg Oral PRN Eappen, Ria Clock, MD      . ondansetron (ZOFRAN) tablet 4 mg  4 mg Oral Q8H PRN Patrecia Pour, NP      . pantoprazole (PROTONIX) EC tablet 40 mg  40 mg Oral Daily Patrecia Pour, NP   40 mg at 01/05/17 0819  . polyethylene glycol (MIRALAX / GLYCOLAX) packet 17 g  17 g Oral Daily Patrecia Pour, NP   17 g at 12/29/16 0820  . propranolol (INDERAL) tablet 20 mg  20 mg Oral BID Patrecia Pour, NP   20 mg at 01/05/17 0819  . simvastatin (ZOCOR) tablet 20 mg  20 mg Oral q1800 Cobos, Myer Peer, MD   20 mg at 01/04/17 1712  . temazepam (RESTORIL) capsule 7.5 mg  7.5 mg Oral QHS Eappen, Saramma, MD   7.5 mg at 01/04/17 2150    PTA Medications: Prescriptions Prior to Admission  Medication Sig Dispense Refill Last Dose  . cloZAPine (CLOZARIL) 100 MG tablet Take  1 tablet (100 mg total) by mouth at bedtime. 30 tablet 1 Past Week at Unknown time  . fluvoxaMINE (LUVOX) 100 MG tablet Take 1 tablet (100 mg total) by mouth at bedtime. 30 tablet 1 Past Week at Unknown time  . insulin aspart (NOVOLOG) 100 UNIT/ML injection Inject 10 Units into the skin 3 (three) times daily with meals. 10 mL 11 Past Week at Unknown time  . insulin glargine (LANTUS) 100 UNIT/ML injection Inject 0.55 mLs (55 Units total) into the skin at bedtime. 10 mL 11 Past Week at Unknown time  . lisinopril (PRINIVIL,ZESTRIL) 20 MG tablet Take 1 tablet (20 mg total) by mouth daily. 30 tablet 1 Past Week at Unknown time  . meloxicam (MOBIC) 7.5 MG tablet Take 1 tablet (7.5 mg total) by mouth 2 (two) times daily after a meal. 60 tablet 1 Past Week at Unknown time  . metFORMIN (GLUCOPHAGE) 1000 MG tablet Take 1 tablet (1,000 mg total) by mouth 2 (two) times daily with a meal. 60 tablet 1  Past Week at Unknown time  . montelukast (SINGULAIR) 10 MG tablet Take 1 tablet (10 mg total) by mouth at bedtime. 30 tablet 1 Past Week at Unknown time  . nicotine (NICODERM CQ - DOSED IN MG/24 HR) 7 mg/24hr patch Place 1 patch (7 mg total) onto the skin daily. 28 patch 0 Past Week at Unknown time  . pantoprazole (PROTONIX) 40 MG tablet Take 1 tablet (40 mg total) by mouth daily. 30 tablet 1 Past Week at Unknown time  . polyethylene glycol (MIRALAX / GLYCOLAX) packet Take 17 g by mouth daily. 14 each 1 Past Week at Unknown time  . propranolol (INDERAL) 20 MG tablet Take 1 tablet (20 mg total) by mouth 2 (two) times daily. 60 tablet 1 Past Week at Unknown time  . simvastatin (ZOCOR) 40 MG tablet Take 1 tablet (40 mg total) by mouth daily at 6 PM. 30 tablet 1 Past Week at Unknown time  . temazepam (RESTORIL) 15 MG capsule Take 1 capsule (15 mg total) by mouth at bedtime. 30 capsule 1 Past Week at Unknown time    Patient Stressors: Financial difficulties Medication change or noncompliance  Patient Strengths: Network engineer for treatment/growth Supportive family/friends  Treatment Modalities: Medication Management, Group therapy, Case management,  1 to 1 session with clinician, Psychoeducation, Recreational therapy.   Physician Treatment Plan for Primary Diagnosis: Schizoaffective Disorder, Bipolar Type Long Term Goal(s): Improvement in symptoms so as ready for discharge  Short Term Goals: Ability to identify changes in lifestyle to reduce recurrence of condition will improve Ability to verbalize feelings will improve Ability to disclose and discuss suicidal ideas Ability to demonstrate self-control will improve Ability to identify and develop effective coping behaviors will improve Compliance with prescribed medications will improve  Medication Management: Evaluate patient's response, side effects, and tolerance of medication  regimen.  Therapeutic Interventions: 1 to 1 sessions, Unit Group sessions and Medication administration.  Evaluation of Outcomes: Progressing   8/6: Anxiety/HTN: Continue Propranolol 20 mg daily.  Mood Control/Stabilization:  Continue Clozapine (Clozaril) from 133m Q hs.  Depression: Continue Fluvoxamine (Luvox) 100 mg Q hs.  8/9: Patient with schizoaffective do , past hx of ECT , recent decompensation , seen as having sleep issues, status changed to do not admit as he has been taking roommate's belongings   Clozaril 150 mg po qhs for psychosis. Clozaril level pending. Increase Trazodone to 100 mg po qhs for sleep. Continue Amantadine 100  mg po bid for neuroleptic induced tremors. Will continue Luvox 100 mg po at qhs for affective sx. Propranolol 20 mg po bid for tremors, anxiety.  Insomnia: Continue Trazodone  50 mg prn at bedtime.  8/14: currently making slow progress on the current medications. He does continues to have negative sx , but is making progress .  Pt will do better in a supervised structured place - CSW will work on placement .   Will continue today 01/05/17 plan as below except where it is noted.  -Continue Clozaril 150 mg po qhs for psychosis. Clozaril level - 598 , CBC with diff - wbc - 7.1 , ANC- 3.0 , Repeat CBC with diff every 2 weeks .  -Continue Restoril 7.5 mg po qhs for sleep.  -Continue Amantadine 100 mg po bid for neuroleptic induced tremors.  -Continue Cogentin to 1 mg po bid for EPS.  -Continue Celexa 20 mg po daily for affective sx.  -Continue Propranolol 20 mg po bid for tremors, anxiety.  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  Therapeutic Interventions: 1 to 1 sessions, Unit Group sessions and Medication administration.  Evaluation of Outcomes: Progressing   RN Treatment Plan for Primary Diagnosis: Schizoaffective Disorder, Bipolar Type Long Term Goal(s): Knowledge of disease and therapeutic regimen to maintain health will improve  Short Term Goals: Ability to identify and develop effective coping behaviors will improve and Compliance with prescribed medications will improve  Medication Management: RN will administer medications as ordered by provider, will assess and evaluate patient's response and provide education to patient for prescribed medication. RN will report any adverse and/or side effects to prescribing provider.  Therapeutic Interventions: 1 on 1 counseling sessions, Psychoeducation, Medication administration, Evaluate responses to treatment, Monitor vital signs and CBGs as ordered, Perform/monitor CIWA, COWS, AIMS and Fall Risk screenings as ordered, Perform wound care treatments as ordered.  Evaluation of Outcomes: Progressing   LCSW Treatment Plan for Primary Diagnosis: Schizoaffective Disorder, Bipolar Type Long Term Goal(s): Safe transition to appropriate next level of care at discharge, Engage patient in therapeutic group addressing interpersonal concerns.  Short Term Goals: Engage patient in aftercare planning with referrals and resources  Therapeutic Interventions: Assess for all discharge needs, 1 to 1 time with Social worker, Explore available resources and support systems, Assess for adequacy in  community support network, Educate family and significant other(s) on suicide prevention, Complete Psychosocial Assessment, Interpersonal group therapy.  Evaluation of Outcomes: Met  Return home, follow up outpt   Progress in Treatment: Attending groups: Intermittently Participating in groups: Minimally Taking medication as prescribed: Yes Toleration medication: Yes, no side effects reported at this time Family/Significant other contact made: No Patient understands diagnosis: Yes AEB asking for help with suicidal thoughts Discussing patient identified problems/goals with staff: Yes Medical problems stabilized or resolved: Yes Denies suicidal/homicidal ideation: Yes Issues/concerns per patient self-inventory: None Other: [name]  New problem(s) identified: None identified at this time.   New Short Term/Long  Term Goal(s): "I want to feel better. I'm too sleepy."  Discharge Plan or Barriers: return home w family and follow up outpatient, consider group home placement  Reason for Continuation of Hospitalization:   Depression Medication stabilization Suicidal ideation   Estimated Length of Stay: 01/08/17  Attendees: Patient:  01/05/2017  10:48 AM  Physician: Ursula Alert, MD 01/05/2017  10:48 AM  Nursing: Sena Hitch, RN 01/05/2017  10:48 AM  RN Care Manager: Lars Pinks, RN 01/05/2017  10:48 AM  Social Worker: Ripley Fraise 01/05/2017  10:48 AM  Recreational Therapist: Winfield Cunas 01/05/2017  10:48 AM  Other: Norberto Sorenson 01/05/2017  10:48 AM  Other:  01/05/2017  10:48 AM    Scribe for Treatment Team:  Roque Lias LCSW 01/05/2017 10:48 AM

## 2017-01-05 NOTE — Progress Notes (Signed)
Recreation Therapy Notes  Animal-Assisted Activity (AAA) Program Checklist/Progress Notes Patient Eligibility Criteria Checklist & Daily Group note for Rec Tx Intervention  Date: 08.14.2018 Time: 2:45pm Location: [address]    AAA/T Program Assumption of Risk Form signed by Patient/ or Parent Legal Guardian Yes  Patient is free of allergies or sever asthma Yes  Patient reports no fear of animals Yes  Patient reports no history of cruelty to animals Yes  Patient understands his/her participation is voluntary Yes  Patient washes hands before animal contact Yes  Patient washes hands after animal contact Yes  Behavioral Response: Appropriate    Education: Hand Washing, Appropriate Animal Interaction   Education Outcome: Acknowledges education.   Clinical Observations/Feedback: Patient discussed with MD for appropriateness in pet therapy session. Both LRT and MD agree patient is appropriate for participation. Patient offered participation in session and signed necessary consent form without issue. Patient attended AAA session, interacted appropriately with therapy dog, peers and staff in session. Patient remarked he does not like cats during session, but made no additional statements.   Marykay Lex Moksha Dorgan, LRT/CTRS         Tyrel Lex L 01/05/2017 3:10 PM

## 2017-01-05 NOTE — Progress Notes (Addendum)
[facility] MD Progress Note  01/05/2017 1:24 PM Jovanie Guebara  MRN:  [id number]   Subjective:Kean states " I am still hearing voices , but they are better. I need help with my sleep."  Objective: Patient seen and chart reviewed.Discussed patient with treatment team.  Pt today was able to walk with writer prior to his evaluation. He seemed to need a lot of encouragement to answer questions initially , however later on was more spontaneous with his speech. Pt smiled at Clinical research associate for the first time and reports his AH is better. He continues to need prompting from staff to take care of his ADLs as well as to get out of his bed . But he has been more visible in milieu , but with encouragement. Pt is tolerating his celexa started and denies any concerns. He does report sleep issues , sleep noted as fair . However , his restless sleep at night may also be due to him sleeping more during the day . Discussed with staff as well as patient to stay out of room as much as possible and to attend groups, staff to encourage.   Principal Problem: Schizoaffective disorder, bipolar type (HCC)  Diagnosis:   Patient Active Problem List   Diagnosis Date Noted  . 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 Schizoaffective disorder, bipolar type (HCC) [F25.0] 12/22/2016  . Schizoaffective disorder, depressive type (HCC) [F25.1] 09/23/2016  . Polysubstance abuse [F19.10] 07/17/2016  . Tardive dyskinesia [G24.01] 07/16/2016  . Tobacco use disorder [F17.200] 07/07/2016  . Dyslipidemia [E78.5] 07/07/2016  . Asthma [J45.909] 07/07/2016  . HTN (hypertension) [I10] 07/06/2016  . Diabetes (HCC) [E11.9] 12/25/2010   Total Time spent with patient: 20 minutes  Past Psychiatric History: Please see H&P.   Past Medical History:  Past Medical History:  Diagnosis Date  . Anxiety   . Asthma   . Diabetes mellitus   . High blood pressure   . Sinus complaint    History reviewed. No pertinent surgical  history. Family History: Please see H&P.  Family Psychiatric  History: Please see H&P.  Social History:  History  Alcohol Use No     History  Drug Use No    Social History   Social History  . Marital status: Single    Spouse name: [name]  . Number of children: [name]  . Years of education: [name]   Social History Main Topics  . Smoking status: Current Every Day Smoker    Packs/day: 0.50    Types: Cigarettes  . Smokeless tobacco: Never Used  . Alcohol use No  . Drug use: No  . Sexual activity: Not Currently   Other Topics Concern  . None   Social History Narrative  . None   Additional Social History:   Sleep: restless  Appetite:  Fair  Current Medications: Current Facility-Administered Medications  Medication Dose Route Frequency Provider Last Rate Last Dose  . acetaminophen (TYLENOL) tablet 650 mg  650 mg Oral Q6H PRN Charm Rings, NP   650 mg at 12/31/16 1710  . alum & mag hydroxide-simeth (MAALOX/MYLANTA) 200-200-20 MG/5ML suspension 30 mL  30 mL Oral Q6H PRN Charm Rings, NP   30 mL at 01/04/17 0820  . amantadine (SYMMETREL) capsule 100 mg  100 mg Oral BID Jomarie Longs, MD   100 mg at 01/05/17 0819  . benztropine (COGENTIN) tablet 1 mg  1 mg Oral BID Jomarie Longs, MD   1 mg at 01/05/17 0819  . cholecalciferol (VITAMIN D) tablet 400  Units  400 Units Oral Daily Jonise Weightman, MD      . citalopram (CELEXA) tablet 20 mg  20 mg Oral Daily Armandina Stammer I, NP   20 mg at 01/05/17 0819  . cloZAPine (CLOZARIL) tablet 150 mg  150 mg Oral QHS Oneta Rack, NP   150 mg at 01/04/17 2142  . ibuprofen (ADVIL,MOTRIN) tablet 600 mg  600 mg Oral Q8H PRN Nelly Rout, MD      . insulin aspart (novoLOG) injection 0-15 Units  0-15 Units Subcutaneous TID WC Charm Rings, NP   5 Units at 01/05/17 1213  . insulin aspart (novoLOG) injection 0-5 Units  0-5 Units Subcutaneous QHS Charm Rings, NP   2 Units at 01/04/17 2144  . insulin aspart (novoLOG) injection 4 Units  4  Units Subcutaneous TID WC Jomarie Longs, MD   4 Units at 01/05/17 1213  . insulin glargine (LANTUS) injection 45 Units  45 Units Subcutaneous QHS Armandina Stammer I, NP   45 Units at 01/04/17 2144  . lisinopril (PRINIVIL,ZESTRIL) tablet 20 mg  20 mg Oral Daily Charm Rings, NP   20 mg at 01/05/17 0819  . magnesium hydroxide (MILK OF MAGNESIA) suspension 30 mL  30 mL Oral Daily PRN Charm Rings, NP      . metFORMIN (GLUCOPHAGE) tablet 1,000 mg  1,000 mg Oral BID WC Charm Rings, NP   1,000 mg at 01/05/17 0819  . montelukast (SINGULAIR) tablet 10 mg  10 mg Oral QHS Charm Rings, NP   10 mg at 01/04/17 2142  . nicotine polacrilex (NICORETTE) gum 2 mg  2 mg Oral PRN Ariyan Sinnett, Levin Bacon, MD      . ondansetron (ZOFRAN) tablet 4 mg  4 mg Oral Q8H PRN Charm Rings, NP      . pantoprazole (PROTONIX) EC tablet 40 mg  40 mg Oral Daily Charm Rings, NP   40 mg at 01/05/17 0819  . polyethylene glycol (MIRALAX / GLYCOLAX) packet 17 g  17 g Oral Daily Charm Rings, NP   17 g at 12/29/16 0820  . propranolol (INDERAL) tablet 20 mg  20 mg Oral BID Charm Rings, NP   20 mg at 01/05/17 0819  . simvastatin (ZOCOR) tablet 20 mg  20 mg Oral q1800 Cobos, Rockey Situ, MD   20 mg at 01/04/17 1712  . temazepam (RESTORIL) capsule 7.5 mg  7.5 mg Oral QHS Roczen Waymire, MD   7.5 mg at 01/04/17 2150   Lab Results:  Results for orders placed or performed during the hospital encounter of 12/22/16 (from the past 48 hour(s))  Glucose, capillary     Status: Abnormal   Collection Time: 01/03/17  5:02 PM  Result Value Ref Range   Glucose-Capillary 153 (H) 65 - 99 mg/dL  Glucose, capillary     Status: Abnormal   Collection Time: 01/03/17  8:21 PM  Result Value Ref Range   Glucose-Capillary 387 (H) 65 - 99 mg/dL   Comment 1 Notify RN   Glucose, capillary     Status: None   Collection Time: 01/04/17  5:51 AM  Result Value Ref Range   Glucose-Capillary 98 65 - 99 mg/dL  CBC with Differential/Platelet      Status: None   Collection Time: 01/04/17  6:28 AM  Result Value Ref Range   WBC 7.1 4.0 - 10.5 K/uL   RBC 4.50 4.22 - 5.81 MIL/uL   Hemoglobin 13.8 13.0 - 17.0 g/dL  HCT 40.3 39.0 - 52.0 %   MCV 89.6 78.0 - 100.0 fL   MCH 30.7 26.0 - 34.0 pg   MCHC 34.2 30.0 - 36.0 g/dL   RDW 57.2 62.0 - 35.5 %   Platelets 245 150 - 400 K/uL   Neutrophils Relative % 43 %   Neutro Abs 3.0 1.7 - 7.7 K/uL   Lymphocytes Relative 45 %   Lymphs Abs 3.2 0.7 - 4.0 K/uL   Monocytes Relative 7 %   Monocytes Absolute 0.5 0.1 - 1.0 K/uL   Eosinophils Relative 4 %   Eosinophils Absolute 0.3 0.0 - 0.7 K/uL   Basophils Relative 1 %   Basophils Absolute 0.0 0.0 - 0.1 K/uL    Comment: Performed at [facility], [address], [place], [place] [address]  Clozapine (clozaril)     Status: None   Collection Time: 01/04/17  6:28 AM  Result Value Ref Range   Clozapine Lvl 463 350 - 650 ng/mL    Comment:               **Please note reference interval change**   NorClozapine 135 Not Estab. ng/mL   Total(Cloz+Norcloz) 598 ng/mL    Comment: (NOTE) Patients dosed with 400 mg clozapine daily for 4 weeks were most likely to exhibit a therapeutic effect when the sum of clozapine and norclozapine concentrations were at least 450 ng/mL. Charlott Rakes, et al. Juel Burrow Consensus Guidelines for Therapeutic Drug Monitoring in Psychiatry: Update 2011, Pharmacopsychiatry Sep 2011; 44(6):195-235.                                Detection Limit = 20 Performed At: [facility] [address] [place], [place] [id number] Mila Homer MD [id number]:[id number] Performed at [facility], [address], [place], [place] [address]   Glucose, capillary     Status: Abnormal   Collection Time: 01/04/17 11:49 AM  Result Value Ref Range   Glucose-Capillary 174 (H) 65 - 99 mg/dL   Comment 1 Notify RN    Comment 2 Document in Chart   Glucose, capillary     Status: Abnormal    Collection Time: 01/04/17  5:07 PM  Result Value Ref Range   Glucose-Capillary 157 (H) 65 - 99 mg/dL   Comment 1 Notify RN    Comment 2 Document in Chart   Glucose, capillary     Status: Abnormal   Collection Time: 01/04/17  8:20 PM  Result Value Ref Range   Glucose-Capillary 231 (H) 65 - 99 mg/dL  Glucose, capillary     Status: Abnormal   Collection Time: 01/05/17  6:19 AM  Result Value Ref Range   Glucose-Capillary 146 (H) 65 - 99 mg/dL  Glucose, capillary     Status: Abnormal   Collection Time: 01/05/17 11:48 AM  Result Value Ref Range   Glucose-Capillary 240 (H) 65 - 99 mg/dL   Blood Alcohol level:  Lab Results  Component Value Date   ETH <5 12/21/2016   ETH <5 10/21/2016   Metabolic Disorder Labs: Lab Results  Component Value Date   HGBA1C 10.3 (H) 11/09/2016   MPG 249 11/09/2016   MPG 275 10/09/2016   Lab Results  Component Value Date   PROLACTIN 24.5 (H) 09/24/2016   PROLACTIN 3.4 (L) 07/07/2016   Lab Results  Component Value Date   CHOL 129 12/28/2016   TRIG 78 12/28/2016  HDL 54 12/28/2016   CHOLHDL 2.4 12/28/2016   VLDL 16 12/28/2016   LDLCALC 59 12/28/2016   LDLCALC 42 11/09/2016   Physical Findings: AIMS: Facial and Oral Movements Muscles of Facial Expression: None, normal Lips and Perioral Area: None, normal Jaw: None, normal Tongue: None, normal,Extremity Movements Upper (arms, wrists, hands, fingers): Mild Lower (legs, knees, ankles, toes): None, normal, Trunk Movements Neck, shoulders, hips: None, normal, Overall Severity Severity of abnormal movements (highest score from questions above): Minimal Incapacitation due to abnormal movements: Minimal Patient's awareness of abnormal movements (rate only patient's report): Aware, no distress, Dental Status Current problems with teeth and/or dentures?: No Does patient usually wear dentures?: No  CIWA:  CIWA-Ar Total: 4 COWS:  COWS Total Score: 4  Musculoskeletal: Strength & Muscle Tone: within  normal limits Gait & Station: normal Patient leans: [name]  Psychiatric Specialty Exam: Physical Exam  Nursing note and vitals reviewed.   Review of Systems  Psychiatric/Behavioral: Positive for depression and hallucinations. The patient is nervous/anxious and has insomnia.   All other systems reviewed and are negative.   Blood pressure 114/75, pulse 89, temperature 98.8 F (37.1 C), temperature source Oral, resp. rate 16, height 6' (1.829 m), weight 81.4 kg (179 lb 8 oz), SpO2 100 %.Body mass index is 24.34 kg/m.  General Appearance: Guarded  Eye Contact:  Fair  Speech:  more spontaneous  Volume:  Decreased  Mood:  Anxious  Affect:  reactive , smiled today  Thought Process:  Linear and Descriptions of Associations: Circumstantial  Orientation:  Other:  self, situation  Thought Content:  Paranoid Ideation  Suicidal Thoughts:  No  Homicidal Thoughts:  No  Memory:  Immediate;   Fair Recent;   Fair Remote;   Fair  Judgement:  Impaired  Insight:  Fair  Psychomotor Activity:  Restlessness and Tremor  Concentration:  Concentration: Fair and Attention Span: Fair  Recall:  [employer] of Knowledge:  Fair  Language:  Fair  Akathisia:  No  Handed:  Right  AIMS (if indicated):   5 - cog wheeling rigidity, tremors   Assets:  Desire for Improvement Resilience Social Support  ADL's:  Intact  Cognition:  WNL  Sleep:  Number of Hours: 5.5   Treatment Plan Summary:Patient with schizoaffective do , hx of past ECT sx , currently making slow progress on the current medications. He does continues to have negative sx , but is making progress . Continue treatment.  Pt will do better in a supervised structured place - CSW will work on placement .   Will continue today 01/05/17 plan as below except where it is noted.   Daily contact with patient to assess and evaluate symptoms and progress in treatment and Medication management   -Continue treatment plan and medication management as  below:  -Continue Clozaril 150 mg po qhs for psychosis. Clozaril level - 598 , CBC with diff - wbc - 7.1 , ANC- 3.0 , Repeat CBC with diff every 2 weeks .  -Continue Restoril 7.5 mg po qhs for sleep.  -Continue Amantadine 100 mg po bid for neuroleptic induced tremors.  -Continue Cogentin to 1 mg po bid for EPS.  -Continue Celexa 20 mg po daily for affective sx.  -Continue Propranolol 20 mg po bid for tremors, anxiety.  -Continue home medications as indicated.DM management as per MAR.  -Start Vitamin D replacement for low Vitamin D level.  Will continue to monitor vitals ,medication compliance and treatment side effects.   Will monitor for  medical issues as well as call consult as needed.  Reviewed labs tsh -wnl, vitamin b12 - wnl.  CSW will continue working on disposition. Referral  to [facility] in progress. Patient encourage to participate in therapeutic milieu .   Jomarie Longs, MD, 01/05/2017, 1:24 PM  Patient ID: [name], male   DOB: [birth year], 41 y.o.   MRN: [id number]

## 2017-01-05 NOTE — Progress Notes (Signed)
Inpatient Diabetes Program Recommendations  AACE/ADA: New Consensus Statement on Inpatient Glycemic Control (2015)  Target Ranges:  Prepandial:   less than 140 mg/dL      Peak postprandial:   less than 180 mg/dL (1-2 hours)      Critically ill patients:  140 - 180 mg/dL   Lab Results  Component Value Date   GLUCAP 240 (H) 01/05/2017   HGBA1C 10.3 (H) 11/09/2016    Review of Glycemic Control  Post-prandial blood sugars > 180 mg/dL. Needs adjustment to meal coverage insulin  Inpatient Diabetes Program Recommendations:   Increase Novolog to 8 units tidwc for meal coverage insulin.  Will continue to follow.  Thank you. Ailene Ards, RD, LDN, CDE Inpatient Diabetes Coordinator [phone]

## 2017-01-05 NOTE — [facility] Group Notes (Signed)
[facility] LCSW Group Therapy  01/05/2017 , 2:11 PM   Type of Therapy:  Group Therapy  Participation Level:  Active  Participation Quality:  Attentive  Affect:  Appropriate  Cognitive:  Alert  Insight:  Improving  Engagement in Therapy:  Engaged  Modes of Intervention:  Discussion, Exploration and Socialization  Summary of Progress/Problems: Today's group focused on the term Diagnosis.  Participants were asked to define the term, and then pronounce whether it is a negative, positive or neutral term. Invited.  Chose to not attend.  Tony Cunningham B 01/05/2017 , 2:11 PM

## 2017-01-05 NOTE — Progress Notes (Signed)
D: Pt at the time of assessment was flat, isolative and withdrawn to self even when in the dayroom. Pt at the time of assessment denied anxiety, depression, AVH, pain, SI or HI; "I am doing better today." Pt could be slow to respond to question. Pt remained calm and cooperative. A: Medications offered as prescribed. All patient's questions and concerns addressed. Support, encouragement, and safe environment provided. 15-minute safety checks continue.  R: Pt was med compliant. Pt did not attend wrap-up group. Safety checks continue.

## 2017-01-05 NOTE — Progress Notes (Signed)
D: Pt withdrawn attended groups. Pt showered Pt could be slow to respond to question. Pt remained calm and cooperative. Pt attend pet therapy.  A: Medications offered as prescribed. All patient's questions and concerns addressed. Support, encouragement, and safe environment provided. 15-minute safety checks continue.  R: Pt was med compliant. Pt participated in groups and at late in cafeteria. Safety checks continue.

## 2017-01-06 LAB — GLUCOSE, CAPILLARY
GLUCOSE-CAPILLARY: 349 mg/dL — AB (ref 65–99)
GLUCOSE-CAPILLARY: 316 mg/dL — AB (ref 65–99)
Glucose-Capillary: 132 mg/dL — ABNORMAL HIGH (ref 65–99)
Glucose-Capillary: 145 mg/dL — ABNORMAL HIGH (ref 65–99)

## 2017-01-06 MED ORDER — LORAZEPAM 1 MG PO TABS
2.0000 mg | ORAL_TABLET | Freq: Once | ORAL | Status: AC
  Administered 2017-01-06: 2 mg via ORAL
  Filled 2017-01-06: qty 2

## 2017-01-06 MED ORDER — IBUPROFEN 800 MG PO TABS: 800.0000 mg | ORAL_TABLET | Freq: Three times a day (TID) | ORAL | Status: DC | PRN

## 2017-01-06 MED ORDER — LORAZEPAM 1 MG PO TABS
1.0000 mg | ORAL_TABLET | Freq: Once | ORAL | Status: AC
  Administered 2017-01-06: 1 mg via ORAL
  Filled 2017-01-06: qty 1

## 2017-01-06 MED ORDER — CYCLOBENZAPRINE HCL 5 MG PO TABS
5.0000 mg | ORAL_TABLET | Freq: Three times a day (TID) | ORAL | Status: DC | PRN
  Administered 2017-01-06 – 2017-01-14 (×5): 5 mg via ORAL
  Filled 2017-01-06 (×5): qty 1

## 2017-01-06 MED ORDER — IBUPROFEN 800 MG PO TABS
800.0000 mg | ORAL_TABLET | Freq: Three times a day (TID) | ORAL | Status: DC | PRN
  Administered 2017-01-06 – 2017-01-12 (×6): 800 mg via ORAL
  Filled 2017-01-06 (×6): qty 1

## 2017-01-06 MED ORDER — HALOPERIDOL 5 MG PO TABS
5.0000 mg | ORAL_TABLET | Freq: Once | ORAL | Status: AC
  Administered 2017-01-06: 5 mg via ORAL
  Filled 2017-01-06 (×2): qty 1

## 2017-01-06 MED ORDER — DICLOFENAC SODIUM 1 % TD GEL
2.0000 g | Freq: Four times a day (QID) | TRANSDERMAL | Status: AC
  Administered 2017-01-06 – 2017-01-11 (×10): 2 g via TOPICAL
  Filled 2017-01-06 (×2): qty 100

## 2017-01-06 MED ORDER — INSULIN ASPART 100 UNIT/ML ~~LOC~~ SOLN
8.0000 [IU] | Freq: Three times a day (TID) | SUBCUTANEOUS | Status: DC
  Administered 2017-01-06 – 2017-01-11 (×13): 8 [IU] via SUBCUTANEOUS

## 2017-01-06 NOTE — Progress Notes (Signed)
  D: Pt continues to be withdrawn and isolative. Pt appears to be reluctant and anxious when asked questions by the writer. When asked about his day pt stated, "I've been up. I've been having a lot of pain in my neck. Pt also informed the writer that he did attend several groups today. Pt has no questions or concerns.   A:  Support and encouragement was offered. 15 min checks continued for safety.  R: Pt remains safe.

## 2017-01-06 NOTE — Progress Notes (Signed)
Adult Psychoeducational Group Note  Date:  01/06/2017 Time:  9:41 PM  Group Topic/Focus:  Wrap-Up Group:   The focus of this group is to help patients review their daily goal of treatment and discuss progress on daily workbooks.  Participation Level:  Minimal  Participation Quality:  Appropriate  Affect:  Appropriate  Cognitive:  Lacking  Insight: Limited  Engagement in Group:  Engaged  Modes of Intervention:  Socialization and Support  Additional Comments:  Patient attended and participated in group tonight. He reports that today was rough and good. He had racing thoughts, had sad spells. He received some medication which helped. Patient advised that he took a shower and got dress differently. He appears very pleased with himself. He is hoping that the racing thoughts and the sad feelings would go away. They are not as bad now but not totally gone.  Lita Mains [facility] 01/06/2017, 9:41 PM

## 2017-01-06 NOTE — Progress Notes (Signed)
[facility] MD Progress Note  01/06/2017 1:51 PM Tony Cunningham  MRN:  [id number]   Subjective:Patient states " I have some neck and shoulder pain and that is really making me anxious.'   Objective:Patient seen and chart reviewed.Discussed patient with treatment team.  Pt today seen as alert, good eye contact , more spontaneous speech. These are all positive changes since previously pt was noted as withdrawn, with very limited speech, poor eye contact and so on. Pt today was seen as preoccupied with his neck and shoulder pain, he described it as making his " head go crazy." Pt likely with anxiety - offered PRN medications for anxiety sx. Pt also was evaluated by NP - Reola Calkins - For pain management and further evaluation.  Pt continues to need support.   Principal Problem: Schizoaffective disorder, bipolar type (HCC)  Diagnosis:   Patient Active Problem List   Diagnosis Date Noted  . 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 Schizoaffective disorder, bipolar type (HCC) [F25.0] 12/22/2016  . Schizoaffective disorder, depressive type (HCC) [F25.1] 09/23/2016  . Polysubstance abuse [F19.10] 07/17/2016  . Tardive dyskinesia [G24.01] 07/16/2016  . Tobacco use disorder [F17.200] 07/07/2016  . Dyslipidemia [E78.5] 07/07/2016  . Asthma [J45.909] 07/07/2016  . HTN (hypertension) [I10] 07/06/2016  . Diabetes (HCC) [E11.9] 12/25/2010   Total Time spent with patient: 30 minutes  Past Psychiatric History: Please see H&P.   Past Medical History:  Past Medical History:  Diagnosis Date  . Anxiety   . Asthma   . Diabetes mellitus   . High blood pressure   . Sinus complaint    History reviewed. No pertinent surgical history. Family History: Please see H&P.  Family Psychiatric  History: Please see H&P.  Social History:  History  Alcohol Use No     History  Drug Use No    Social History   Social History  . Marital status: Single    Spouse name: [name]  . Number of  children: [name]  . Years of education: [name]   Social History Main Topics  . Smoking status: Current Every Day Smoker    Packs/day: 0.50    Types: Cigarettes  . Smokeless tobacco: Never Used  . Alcohol use No  . Drug use: No  . Sexual activity: Not Currently   Other Topics Concern  . None   Social History Narrative  . None   Additional Social History:   Sleep: Fair  Appetite:  Fair  Current Medications: Current Facility-Administered Medications  Medication Dose Route Frequency Provider Last Rate Last Dose  . acetaminophen (TYLENOL) tablet 650 mg  650 mg Oral Q6H PRN Charm Rings, NP   650 mg at 01/06/17 4010  . alum & mag hydroxide-simeth (MAALOX/MYLANTA) 200-200-20 MG/5ML suspension 30 mL  30 mL Oral Q6H PRN Charm Rings, NP   30 mL at 01/04/17 0820  . amantadine (SYMMETREL) capsule 100 mg  100 mg Oral BID Jomarie Longs, MD   100 mg at 01/06/17 0829  . benztropine (COGENTIN) tablet 1 mg  1 mg Oral BID Jomarie Longs, MD   1 mg at 01/06/17 0829  . cholecalciferol (VITAMIN D) tablet 400 Units  400 Units Oral Daily Jomarie Longs, MD   400 Units at 01/06/17 0829  . citalopram (CELEXA) tablet 20 mg  20 mg Oral Daily Nwoko, Agnes I, NP   20 mg at 01/06/17 0830  . cloZAPine (CLOZARIL) tablet 150 mg  150 mg Oral QHS Oneta Rack, NP   150 mg  at 01/05/17 2143  . cyclobenzaprine (FLEXERIL) tablet 5 mg  5 mg Oral TID PRN Money, Gerlene Burdock, FNP      . ibuprofen (ADVIL,MOTRIN) tablet 800 mg  800 mg Oral Q8H PRN Money, Gerlene Burdock, FNP      . insulin aspart (novoLOG) injection 0-15 Units  0-15 Units Subcutaneous TID WC Charm Rings, NP   2 Units at 01/06/17 1218  . insulin aspart (novoLOG) injection 0-5 Units  0-5 Units Subcutaneous QHS Charm Rings, NP   2 Units at 01/05/17 2143  . insulin aspart (novoLOG) injection 8 Units  8 Units Subcutaneous TID WC Dylan Ruotolo, MD      . insulin glargine (LANTUS) injection 45 Units  45 Units Subcutaneous QHS Armandina Stammer I, NP   45 Units  at 01/05/17 2023  . lisinopril (PRINIVIL,ZESTRIL) tablet 20 mg  20 mg Oral Daily Charm Rings, NP   20 mg at 01/06/17 1173  . magnesium hydroxide (MILK OF MAGNESIA) suspension 30 mL  30 mL Oral Daily PRN Charm Rings, NP      . metFORMIN (GLUCOPHAGE) tablet 1,000 mg  1,000 mg Oral BID WC Charm Rings, NP   1,000 mg at 01/06/17 0829  . montelukast (SINGULAIR) tablet 10 mg  10 mg Oral QHS Charm Rings, NP   10 mg at 01/05/17 2143  . nicotine polacrilex (NICORETTE) gum 2 mg  2 mg Oral PRN Araeya Lamb, Levin Bacon, MD      . ondansetron (ZOFRAN) tablet 4 mg  4 mg Oral Q8H PRN Charm Rings, NP      . pantoprazole (PROTONIX) EC tablet 40 mg  40 mg Oral Daily Charm Rings, NP   40 mg at 01/06/17 5670  . polyethylene glycol (MIRALAX / GLYCOLAX) packet 17 g  17 g Oral Daily Charm Rings, NP   17 g at 01/06/17 0830  . propranolol (INDERAL) tablet 20 mg  20 mg Oral BID Charm Rings, NP   20 mg at 01/06/17 1410  . simvastatin (ZOCOR) tablet 20 mg  20 mg Oral q1800 Cobos, Rockey Situ, MD   20 mg at 01/05/17 1721  . temazepam (RESTORIL) capsule 7.5 mg  7.5 mg Oral QHS Eithan Beagle, MD   7.5 mg at 01/05/17 2143   Lab Results:  Results for orders placed or performed during the hospital encounter of 12/22/16 (from the past 48 hour(s))  Glucose, capillary     Status: Abnormal   Collection Time: 01/04/17  5:07 PM  Result Value Ref Range   Glucose-Capillary 157 (H) 65 - 99 mg/dL   Comment 1 Notify RN    Comment 2 Document in Chart   Glucose, capillary     Status: Abnormal   Collection Time: 01/04/17  8:20 PM  Result Value Ref Range   Glucose-Capillary 231 (H) 65 - 99 mg/dL  Glucose, capillary     Status: Abnormal   Collection Time: 01/05/17  6:19 AM  Result Value Ref Range   Glucose-Capillary 146 (H) 65 - 99 mg/dL  Glucose, capillary     Status: Abnormal   Collection Time: 01/05/17 11:48 AM  Result Value Ref Range   Glucose-Capillary 240 (H) 65 - 99 mg/dL  Glucose, capillary     Status:  Abnormal   Collection Time: 01/05/17  5:13 PM  Result Value Ref Range   Glucose-Capillary 280 (H) 65 - 99 mg/dL  Glucose, capillary     Status: Abnormal   Collection Time: 01/05/17  7:59 PM  Result Value Ref Range   Glucose-Capillary 201 (H) 65 - 99 mg/dL  Glucose, capillary     Status: Abnormal   Collection Time: 01/06/17  5:51 AM  Result Value Ref Range   Glucose-Capillary 132 (H) 65 - 99 mg/dL  Glucose, capillary     Status: Abnormal   Collection Time: 01/06/17 11:54 AM  Result Value Ref Range   Glucose-Capillary 145 (H) 65 - 99 mg/dL   Comment 1 Notify RN    Comment 2 Document in Chart    Blood Alcohol level:  Lab Results  Component Value Date   ETH <5 12/21/2016   ETH <5 10/21/2016   Metabolic Disorder Labs: Lab Results  Component Value Date   HGBA1C 10.3 (H) 11/09/2016   MPG 249 11/09/2016   MPG 275 10/09/2016   Lab Results  Component Value Date   PROLACTIN 24.5 (H) 09/24/2016   PROLACTIN 3.4 (L) 07/07/2016   Lab Results  Component Value Date   CHOL 129 12/28/2016   TRIG 78 12/28/2016   HDL 54 12/28/2016   CHOLHDL 2.4 12/28/2016   VLDL 16 12/28/2016   LDLCALC 59 12/28/2016   LDLCALC 42 11/09/2016   Physical Findings: AIMS: Facial and Oral Movements Muscles of Facial Expression: None, normal Lips and Perioral Area: None, normal Jaw: None, normal Tongue: None, normal,Extremity Movements Upper (arms, wrists, hands, fingers): Mild Lower (legs, knees, ankles, toes): None, normal, Trunk Movements Neck, shoulders, hips: None, normal, Overall Severity Severity of abnormal movements (highest score from questions above): Minimal Incapacitation due to abnormal movements: Minimal Patient's awareness of abnormal movements (rate only patient's report): Aware, no distress, Dental Status Current problems with teeth and/or dentures?: No Does patient usually wear dentures?: No  CIWA:  CIWA-Ar Total: 4 COWS:  COWS Total Score: 4  Musculoskeletal: Strength & Muscle  Tone: within normal limits Gait & Station: normal Patient leans: [name]  Psychiatric Specialty Exam: Physical Exam  Nursing note and vitals reviewed.   Review of Systems  Musculoskeletal: Positive for neck pain.  Psychiatric/Behavioral: Positive for depression and hallucinations. The patient is nervous/anxious.   All other systems reviewed and are negative.   Blood pressure 122/78, pulse 94, temperature 97.8 F (36.6 C), resp. rate 16, height 6' (1.829 m), weight 81.4 kg (179 lb 8 oz), SpO2 100 %.Body mass index is 24.34 kg/m.  General Appearance: Guarded  Eye Contact:  Fair  Speech:  Normal Rate  Volume:  Decreased  Mood:  Anxious  Affect:  Congruent  Thought Process:  Linear and Descriptions of Associations: Circumstantial  Orientation:  Full (Time, Place, and Person)  Thought Content:  Paranoid Ideation, improving, AH improving  Suicidal Thoughts:  No  Homicidal Thoughts:  No  Memory:  Immediate;   Fair Recent;   Fair Remote;   Fair  Judgement:  Fair  Insight:  Fair  Psychomotor Activity:  Tremor  Concentration:  Concentration: Fair and Attention Span: Fair  Recall:  [employer] of Knowledge:  Fair  Language:  Fair  Akathisia:  No  Handed:  Right  AIMS (if indicated):   5- cogwheeling rigidity, tremors   Assets:  Desire for Improvement Resilience Social Support  ADL's:  Intact  Cognition:  WNL  Sleep:  Number of Hours: 6.25   Treatment Plan Summary:Patient with schizoaffective do , hx of past ECT treatments , currently making progress , his parkinsonian sx , likely neuroleptic induced continues to be the same , is not worsening, currently on medications to help  with the same. Pt with improved sleep as well as affect , his negative sx are improving. Continue treatment.  Will continue today 01/06/17  plan as below except where it is noted.   Daily contact with patient to assess and evaluate symptoms and progress in treatment and Medication management   -Continue  treatment plan and medication management as below:  -Continue Clozaril 150 mg po qhs for psychosis. Clozaril level - 598 , CBC with diff - wbc - 7.1 , ANC- 3.0 , Repeat CBC with diff every 2 weeks .  -Continue Restoril 7.5 mg po qhs for sleep.  -Continue Amantadine 100 mg po bid for neuroleptic induced tremors.  -Continue Cogentin to 1 mg po bid for EPS.  -Continue Celexa 20 mg po daily for affective sx.  -Continue Propranolol 20 mg po bid for tremors, anxiety.  -Started Flexeril 5 mg po tid prn for muscle spasms , motrin 800 mg po q8h prn for pain.  -Made changes to his Novolog Insulin as per diabetic consult recommendations.  -Started Vitamin D replacement for low Vitamin D level.  Will continue to monitor vitals ,medication compliance and treatment side effects.   Will monitor for medical issues as well as call consult as needed.  Reviewed labs tsh -wnl, vitamin b12 - wnl.  CSW will continue working on disposition. Referral  to [facility] in progress.[facility] staff to visit patient today . Patient encourage to participate in therapeutic milieu .   Jomarie Longs, MD, 01/06/2017, 1:51 PM  Patient ID: Tony Cunningham, male   DOB: 1975-09-14, 41 y.o.   MRN: [id number]

## 2017-01-06 NOTE — [place] FL2 (Signed)
[place] MEDICAID FL2 LEVEL OF CARE SCREENING TOOL     IDENTIFICATION  Patient Name: Tony Cunningham Birthdate: 05-20-1976 Sex: male Admission Date (Current Location): 12/22/2016  [place] and [place] Number:  [name] [id number] T Facility and Address:   [facility]  [address]  [place] [address])      Provider Number: [id number]  Attending Physician Name and Address:  Jomarie Longs, MD  Relative Name and Phone Number:  [name], mother.  [phone]    Current Level of Care: Hospital Recommended Level of Care: [name], Assisted Living Facility Prior Approval Number:    Date Approved/Denied:   PASRR Number: [id number]  Discharge Plan: [place], [place], [place]    Current Diagnoses: Patient Active Problem List   Diagnosis Date Noted  . 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 Schizoaffective disorder, bipolar type (HCC) 12/22/2016  . Schizoaffective disorder, depressive type (HCC) 09/23/2016  . Polysubstance abuse 07/17/2016  . Tardive dyskinesia 07/16/2016  . Tobacco use disorder 07/07/2016  . Dyslipidemia 07/07/2016  . Asthma 07/07/2016  . HTN (hypertension) 07/06/2016  . Diabetes (HCC) 12/25/2010    Orientation RESPIRATION BLADDER Height & Weight     Self, Time, Situation, Place  Normal Continent Weight: 179 lb 8 oz (81.4 kg) Height:  6' (182.9 cm)  BEHAVIORAL SYMPTOMS/MOOD NEUROLOGICAL BOWEL NUTRITION STATUS     (None) Continent  (Diabetic diet)  AMBULATORY STATUS COMMUNICATION OF NEEDS Skin   Independent Verbally Normal                       Personal Care Assistance Level of Assistance  Bathing, Feeding, Dressing Bathing Assistance: Independent Feeding assistance: Independent Dressing Assistance: Independent     Functional Limitations Info   (None)          SPECIAL CARE FACTORS FREQUENCY   (None)                    Contractures Contractures Info: Not present    Additional  Factors Info   (None)               Current Medications (01/06/2017):  This is the current hospital active medication list                                                                                                       questions regarding medications not on this list, please follow up with your healthcare provider. Bring this list to your next appointment.  Current Medication List-Note: Take only these medications. Stop taking all medications not included on this list. If you have any questions regarding medications not on this list, please follow up with your healthcare provider. Bring this list to your next appointment.    Morning Afternoon Evening Bedtime As Needed   amantadine 100 MG capsule  Commonly known as: SYMMETREL  Take 1 capsule (100 mg total) by mouth 2 (two) times daily. For prevention of drug induced tremors  For: Extrapyramidal Reaction caused by Medications  Next does due:  benztropine 1 MG tablet  Commonly known as: COGENTIN  Take 1 tablet (1 mg total) by mouth 2 (two) times daily. For prevention of drug induced tremors  For: Extrapyramidal Reaction caused by Medications  Next does due:               citalopram 20 MG tablet  Commonly known as: CELEXA  Take 1 tablet (20 mg total) by mouth daily. For depression  For: Depression  Next does due:               clozapine 50 MG tablet  Commonly known as: CLOZARIL  Take 3 tablets (150 mg total) by mouth at bedtime. For mood control  What changed:  0 medication strength  0 how much to take  0 additional instructions  For: Schizoaffective Disorder  Next does due:               cyclobenzaprine 5 MG tablet  Commonly known as: FLEXERIL  Take 1 tablet (5 mg total) by mouth 3 (three) times daily as needed for muscle spasms.  For: Muscle Spasm  Next does due:               diclofenac sodium 1 % Gel  Commonly known as: VOLTAREN  Apply 2 g topically 4 (four) times  daily. For arthritis pain  For: Joint Damage causing Pain and Loss of Function  Next does due:               insulin aspart 100 UNIT/ML injection  Commonly known as: novoLOG  Inject 8 Units into the skin 3 (three) times daily with meals. For diabetes management  What changed:  0 how much to take  0 additional instructions  For: Type 2 Diabetes  Next does due:               insulin glargine 100 UNIT/ML injection  Commonly known as: LANTUS  Inject 0.45 mLs (45 Units total) into the skin at bedtime. For diabetic management  What changed:  0 how much to take  0 additional instructions  For: Type 2 Diabetes  Next does due:               lisinopril 20 MG tablet  Commonly known as: PRINIVIL,ZESTRIL  Take 1 tablet (20 mg total) by mouth daily. For high blood pressure  What changed: additional instructions  For: High Blood Pressure Disorder  Next does due:               metFORMIN 1000 MG tablet  Commonly known as: GLUCOPHAGE  Take 1 tablet (1,000 mg total) by mouth 2 (two) times daily with a meal. For diabetes management  What changed: additional instructions  For: Type 2 Diabetes  Next does due:               montelukast 10 MG tablet  Commonly known as: SINGULAIR  Take 1 tablet (10 mg total) by mouth at bedtime. For shortness of breath  What changed: additional instructions  For: Asthma  Next does due:               nicotine polacrilex 2 MG gum  Commonly known as: NICORETTE  Take 1 each (2 mg total) by mouth as needed for smoking cessation.  For: Nicotine Addiction  Next does due:               pantoprazole 40 MG tablet  Commonly known as: PROTONIX  Take 1 tablet (40 mg total)  by mouth daily. For acid reflux  What changed: additional instructions  For: Gastroesophageal Reflux Disease  Next does due:               polyethylene glycol packet  Commonly known as: MIRALAX / GLYCOLAX  Take 17 g by mouth daily. For constipation  What  changed: additional instructions  For: Constipation  Next does due:               propranolol 20 MG tablet  Commonly known as: INDERAL  Take 1 tablet (20 mg total) by mouth 2 (two) times daily. For high blood pressure  What changed: additional instructions  For: High Blood Pressure Disorder  Next does due:               simvastatin 20 MG tablet  Commonly known as: ZOCOR  Take 1 tablet (20 mg total) by mouth daily at 6 PM. For high Cholesterol  What changed:  0 medication strength  0 how much to take  0 additional instructions  For: High Amount of Triglycerides in the Blood  Next does due:               temazepam 7.5 MG capsule  Commonly known as: RESTORIL  Take 1 capsule (7.5 mg total) by mouth at bedtime. For sleep  What changed:  0 medication strength  0 how much to take  0 additional instructions  For: Trouble Sleeping  Next does due:               Vitamin D3 400 units tablet  Take 1 tablet (400 Units total) by mouth daily. For bone health  For: [facility] health  Next does due:             Discharge Medications: Please see discharge summary for a list of discharge medications.  Relevant Imaging Results:  Relevant Lab Results:   Additional Information    Ida Rogue, LCSW

## 2017-01-06 NOTE — Progress Notes (Signed)
Patient denies SI, HI and AVH.  Patient showered with staff assistance.  Patient was noted to be calm and cooperative.  Patient complained about his tremulous movements and pain in the back of his neck.   Assess patient for safety, offer medications as prescribed, engage patient in 1:1 staff talks.   Patient able to contract for safety. Continue to monitor as prescribed.  

## 2017-01-06 NOTE — [facility] Group Notes (Signed)
[facility] LCSW Group Therapy  01/06/2017 3:23 PM   Type of Therapy:  Group Therapy  Participation Level:  Active  Participation Quality:  Attentive  Affect:  Appropriate  Cognitive:  Appropriate  Insight:  Improving  Engagement in Therapy:  Engaged  Modes of Intervention:  Clarification, Education, Exploration and Socialization  Summary of Progress/Problems: Today's group focused on resilience.  Tony Cunningham attended group.  Tony Cunningham was very quiet and identified that being here and working with the nursing staff has helped him to be resilient.  Tony Cunningham stayed in group for the entire session and actively participated in the group discussion.  Tony Cunningham was given encouragement and praise for being an active participant. [name] 01/06/2017 , 3:23 PM

## 2017-01-07 LAB — GLUCOSE, CAPILLARY
GLUCOSE-CAPILLARY: 60 mg/dL — AB (ref 65–99)
GLUCOSE-CAPILLARY: 68 mg/dL (ref 65–99)
GLUCOSE-CAPILLARY: 147 mg/dL — AB (ref 65–99)
GLUCOSE-CAPILLARY: 85 mg/dL (ref 65–99)
Glucose-Capillary: 268 mg/dL — ABNORMAL HIGH (ref 65–99)
Glucose-Capillary: 94 mg/dL (ref 65–99)

## 2017-01-07 MED ORDER — HALOPERIDOL 5 MG PO TABS
5.0000 mg | ORAL_TABLET | Freq: Once | ORAL | Status: AC
  Administered 2017-01-07: 5 mg via ORAL
  Filled 2017-01-07 (×2): qty 1

## 2017-01-07 MED ORDER — LORAZEPAM 1 MG PO TABS
2.0000 mg | ORAL_TABLET | Freq: Once | ORAL | Status: AC
  Administered 2017-01-07: 2 mg via ORAL
  Filled 2017-01-07: qty 2

## 2017-01-07 NOTE — Progress Notes (Signed)
Patient denies SI, HI and AVH.  Patient showered with staff assistance.  Patient was noted to be calm and cooperative.  Patient complained about his tremulous movements and pain in the back of his neck.   Assess patient for safety, offer medications as prescribed, engage patient in 1:1 staff talks.   Patient able to contract for safety. Continue to monitor as prescribed.

## 2017-01-07 NOTE — [facility] Group Notes (Signed)
[facility] [employer] Group Therapy  01/07/2017 , 2:08 PM    Type of Therapy:  [employer] Presentation  Participation Level:  Active  Participation Quality:  Attentive  Affect:  Blunted  Cognitive:  Oriented  Insight:  Limited  Engagement in Therapy:  Engaged  Modes of Intervention:  Discussion, Education and Socialization  Summary of Progress/Problems:  Onalee Hua from [employer] came to present his recovery story and play the guitar.  Called out a couple of times to see providers.  Stayed the rest of the time, engaged throughout.  [name] 01/07/2017 , 2:08 PM

## 2017-01-07 NOTE — Progress Notes (Signed)
[facility] MD Progress Note  01/07/2017 10:07 AM Tony Cunningham  MRN:  [id number]   Subjective: Patient states " I have some neck pain and depression"  Objective: Patient seen and chart reviewed. Discussed patient with treatment team. Patient has been staying in his room this morning, less talkative and delayed responses. He is awake, alert, and good eye contact, and more spontaneous speech. These are all positive changes since previously patient was noted as withdrawn, with very limited speech, poor eye contact and so on. Patient continues to need support. He has no tremors or akathisia. He is compliant with medications.   Principal Problem: Schizoaffective disorder, bipolar type (HCC)  Diagnosis:   Patient Active Problem List   Diagnosis Date Noted  . 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 Schizoaffective disorder, bipolar type (HCC) [F25.0] 12/22/2016  . Schizoaffective disorder, depressive type (HCC) [F25.1] 09/23/2016  . Polysubstance abuse [F19.10] 07/17/2016  . Tardive dyskinesia [G24.01] 07/16/2016  . Tobacco use disorder [F17.200] 07/07/2016  . Dyslipidemia [E78.5] 07/07/2016  . Asthma [J45.909] 07/07/2016  . HTN (hypertension) [I10] 07/06/2016  . Diabetes (HCC) [E11.9] 12/25/2010   Total Time spent with patient: 20 minutes  Past Psychiatric History: Please see H&P.   Past Medical History:  Past Medical History:  Diagnosis Date  . Anxiety   . Asthma   . Diabetes mellitus   . High blood pressure   . Sinus complaint    History reviewed. No pertinent surgical history. Family History: Please see H&P.  Family Psychiatric  History: Please see H&P.  Social History:  History  Alcohol Use No     History  Drug Use No    Social History   Social History  . Marital status: Single    Spouse name: [name]  . Number of children: [name]  . Years of education: [name]   Social History Main Topics  . Smoking status: Current Every Day Smoker    Packs/day: 0.50     Types: Cigarettes  . Smokeless tobacco: Never Used  . Alcohol use No  . Drug use: No  . Sexual activity: Not Currently   Other Topics Concern  . None   Social History Narrative  . None   Additional Social History:   Sleep: Fair  Appetite:  Fair  Current Medications: Current Facility-Administered Medications  Medication Dose Route Frequency Provider Last Rate Last Dose  . alum & mag hydroxide-simeth (MAALOX/MYLANTA) 200-200-20 MG/5ML suspension 30 mL  30 mL Oral Q6H PRN Charm Rings, NP   30 mL at 01/04/17 0820  . amantadine (SYMMETREL) capsule 100 mg  100 mg Oral BID Jomarie Longs, MD   100 mg at 01/07/17 0810  . benztropine (COGENTIN) tablet 1 mg  1 mg Oral BID Jomarie Longs, MD   1 mg at 01/07/17 0810  . cholecalciferol (VITAMIN D) tablet 400 Units  400 Units Oral Daily Jomarie Longs, MD   400 Units at 01/07/17 0810  . citalopram (CELEXA) tablet 20 mg  20 mg Oral Daily Armandina Stammer I, NP   20 mg at 01/07/17 0810  . cloZAPine (CLOZARIL) tablet 150 mg  150 mg Oral QHS Oneta Rack, NP   150 mg at 01/06/17 2231  . cyclobenzaprine (FLEXERIL) tablet 5 mg  5 mg Oral TID PRN Money, Gerlene Burdock, FNP   5 mg at 01/06/17 1550  . diclofenac sodium (VOLTAREN) 1 % transdermal gel 2 g  2 g Topical QID Jomarie Longs, MD   2 g at 01/07/17 0812  .  ibuprofen (ADVIL,MOTRIN) tablet 800 mg  800 mg Oral Q8H PRN Jomarie Longs, MD   800 mg at 01/06/17 1725  . insulin aspart (novoLOG) injection 0-15 Units  0-15 Units Subcutaneous TID WC Charm Rings, NP   11 Units at 01/06/17 1722  . insulin aspart (novoLOG) injection 0-5 Units  0-5 Units Subcutaneous QHS Charm Rings, NP   4 Units at 01/06/17 2233  . insulin aspart (novoLOG) injection 8 Units  8 Units Subcutaneous TID WC Jomarie Longs, MD   8 Units at 01/07/17 0811  . insulin glargine (LANTUS) injection 45 Units  45 Units Subcutaneous QHS Armandina Stammer I, NP   45 Units at 01/06/17 2006  . lisinopril (PRINIVIL,ZESTRIL) tablet 20 mg  20  mg Oral Daily Charm Rings, NP   20 mg at 01/07/17 0810  . magnesium hydroxide (MILK OF MAGNESIA) suspension 30 mL  30 mL Oral Daily PRN Charm Rings, NP      . metFORMIN (GLUCOPHAGE) tablet 1,000 mg  1,000 mg Oral BID WC Charm Rings, NP   1,000 mg at 01/07/17 0810  . montelukast (SINGULAIR) tablet 10 mg  10 mg Oral QHS Charm Rings, NP   10 mg at 01/06/17 2231  . nicotine polacrilex (NICORETTE) gum 2 mg  2 mg Oral PRN Eappen, Levin Bacon, MD      . ondansetron (ZOFRAN) tablet 4 mg  4 mg Oral Q8H PRN Charm Rings, NP      . pantoprazole (PROTONIX) EC tablet 40 mg  40 mg Oral Daily Charm Rings, NP   40 mg at 01/07/17 0810  . polyethylene glycol (MIRALAX / GLYCOLAX) packet 17 g  17 g Oral Daily Charm Rings, NP   17 g at 01/06/17 0830  . propranolol (INDERAL) tablet 20 mg  20 mg Oral BID Charm Rings, NP   20 mg at 01/07/17 0809  . simvastatin (ZOCOR) tablet 20 mg  20 mg Oral q1800 Cobos, Rockey Situ, MD   20 mg at 01/06/17 1726  . temazepam (RESTORIL) capsule 7.5 mg  7.5 mg Oral QHS Eappen, Saramma, MD   7.5 mg at 01/06/17 2231   Lab Results:  Results for orders placed or performed during the hospital encounter of 12/22/16 (from the past 48 hour(s))  Glucose, capillary     Status: Abnormal   Collection Time: 01/05/17 11:48 AM  Result Value Ref Range   Glucose-Capillary 240 (H) 65 - 99 mg/dL  Glucose, capillary     Status: Abnormal   Collection Time: 01/05/17  5:13 PM  Result Value Ref Range   Glucose-Capillary 280 (H) 65 - 99 mg/dL  Glucose, capillary     Status: Abnormal   Collection Time: 01/05/17  7:59 PM  Result Value Ref Range   Glucose-Capillary 201 (H) 65 - 99 mg/dL  Glucose, capillary     Status: Abnormal   Collection Time: 01/06/17  5:51 AM  Result Value Ref Range   Glucose-Capillary 132 (H) 65 - 99 mg/dL  Glucose, capillary     Status: Abnormal   Collection Time: 01/06/17 11:54 AM  Result Value Ref Range   Glucose-Capillary 145 (H) 65 - 99 mg/dL    Comment 1 Notify RN    Comment 2 Document in Chart   Glucose, capillary     Status: Abnormal   Collection Time: 01/06/17  4:59 PM  Result Value Ref Range   Glucose-Capillary 349 (H) 65 - 99 mg/dL   Comment 1 Notify RN  Comment 2 Document in Chart   Glucose, capillary     Status: Abnormal   Collection Time: 01/06/17  8:34 PM  Result Value Ref Range   Glucose-Capillary 316 (H) 65 - 99 mg/dL  Glucose, capillary     Status: Abnormal   Collection Time: 01/07/17  5:54 AM  Result Value Ref Range   Glucose-Capillary 60 (L) 65 - 99 mg/dL  Glucose, capillary     Status: None   Collection Time: 01/07/17  6:22 AM  Result Value Ref Range   Glucose-Capillary 68 65 - 99 mg/dL  Glucose, capillary     Status: Abnormal   Collection Time: 01/07/17  7:24 AM  Result Value Ref Range   Glucose-Capillary 147 (H) 65 - 99 mg/dL   Blood Alcohol level:  Lab Results  Component Value Date   ETH <5 12/21/2016   ETH <5 10/21/2016   Metabolic Disorder Labs: Lab Results  Component Value Date   HGBA1C 10.3 (H) 11/09/2016   MPG 249 11/09/2016   MPG 275 10/09/2016   Lab Results  Component Value Date   PROLACTIN 24.5 (H) 09/24/2016   PROLACTIN 3.4 (L) 07/07/2016   Lab Results  Component Value Date   CHOL 129 12/28/2016   TRIG 78 12/28/2016   HDL 54 12/28/2016   CHOLHDL 2.4 12/28/2016   VLDL 16 12/28/2016   LDLCALC 59 12/28/2016   LDLCALC 42 11/09/2016   Physical Findings: AIMS: Facial and Oral Movements Muscles of Facial Expression: None, normal Lips and Perioral Area: None, normal Jaw: None, normal Tongue: None, normal,Extremity Movements Upper (arms, wrists, hands, fingers): Mild Lower (legs, knees, ankles, toes): None, normal, Trunk Movements Neck, shoulders, hips: None, normal, Overall Severity Severity of abnormal movements (highest score from questions above): Minimal Incapacitation due to abnormal movements: Minimal Patient's awareness of abnormal movements (rate only patient's  report): Aware, no distress, Dental Status Current problems with teeth and/or dentures?: No Does patient usually wear dentures?: No  CIWA:  CIWA-Ar Total: 4 COWS:  COWS Total Score: 4  Musculoskeletal: Strength & Muscle Tone: within normal limits Gait & Station: normal Patient leans: [name]  Psychiatric Specialty Exam: Physical Exam  Nursing note and vitals reviewed.   Review of Systems  Musculoskeletal: Positive for neck pain.  Psychiatric/Behavioral: Positive for depression and hallucinations. The patient is nervous/anxious.   All other systems reviewed and are negative.   Blood pressure 119/73, pulse 97, temperature 97.8 F (36.6 C), resp. rate 16, height 6' (1.829 m), weight 81.4 kg (179 lb 8 oz), SpO2 100 %.Body mass index is 24.34 kg/m.  General Appearance: Guarded  Eye Contact:  Fair  Speech:  Normal Rate  Volume:  Decreased  Mood:  Depressed  Affect:  Congruent  Thought Process:  Linear and Descriptions of Associations: Circumstantial  Orientation:  Full (Time, Place, and Person)  Thought Content:  Paranoid Ideation, AH improving  Suicidal Thoughts:  No  Homicidal Thoughts:  No  Memory:  Immediate;   Fair Recent;   Fair Remote;   Fair  Judgement:  Fair  Insight:  Fair  Psychomotor Activity:  Tremor  Concentration:  Concentration: Fair and Attention Span: Fair  Recall:  [employer] of Knowledge:  Fair  Language:  Fair  Akathisia:  No  Handed:  Right  AIMS (if indicated):      Assets:  Desire for Improvement Resilience Social Support  ADL's:  Intact  Cognition:  WNL  Sleep:  Number of Hours: 5.5   Treatment Plan Summary: Patient with  schizoaffective do , hx of past ECT treatments , currently making progress, his parkinsonian sx , likely neuroleptic induced continues to be the same , is not worsening, currently on medications to help with the same. Patient with improved sleep as well as affect, his negative sx are improving.  Continue current treatment  Plan: Will continue today 01/07/17  plan as below except where it is noted. Daily contact with patient to assess and evaluate symptoms and progress in treatment and Medication management   -Continue treatment plan and medication management as below:  -Continue Clozaril 150 mg po qhs for psychosis. Clozaril level - 598 , CBC with diff - wbc - 7.1 , ANC- 3.0 , Repeat CBC with diff every 2 weeks .  -Continue Restoril 7.5 mg po qhs for sleep.  -Continue Amantadine 100 mg po bid for neuroleptic induced tremors.  -Continue Cogentin 1 mg po bid for EPS.  -Continue Celexa 20 mg po daily for affective sx.  -Continue Propranolol 20 mg po bid for tremors, anxiety.  -Continue Flexeril 5 mg po tid prn for muscle spasms , motrin 800 mg po q8h prn for pain.  -Continue Novolog Insulin as per diabetic consult recommendations.  -Vitamin D replacement for low Vitamin D level.  Continue to monitor vitals ,medication compliance and treatment side effects.   Will monitor for medical issues as well as call consult as needed.  Reviewed labs tsh -wnl, vitamin b12 - wnl.  CSW will continue working on disposition. Referral  to [facility] in progress.[facility] staff to visit patient today. Patient encourage to participate in therapeutic milieu .   Leata Mouse, MD, 01/07/2017, 10:07 AM

## 2017-01-08 LAB — GLUCOSE, CAPILLARY
GLUCOSE-CAPILLARY: 443 mg/dL — AB (ref 65–99)
GLUCOSE-CAPILLARY: 313 mg/dL — AB (ref 65–99)
GLUCOSE-CAPILLARY: 245 mg/dL — AB (ref 65–99)
Glucose-Capillary: 205 mg/dL — ABNORMAL HIGH (ref 65–99)
Glucose-Capillary: 145 mg/dL — ABNORMAL HIGH (ref 65–99)

## 2017-01-08 MED ORDER — TUBERCULIN PPD 5 UNIT/0.1ML ID SOLN: 5.0000 [IU] | Freq: Once | INTRADERMAL | Status: DC

## 2017-01-08 MED ORDER — CYCLOBENZAPRINE HCL 5 MG PO TABS: 5.0000 mg | ORAL_TABLET | Freq: Three times a day (TID) | ORAL | 0 refills | Status: DC | PRN

## 2017-01-08 MED ORDER — BENZTROPINE MESYLATE 1 MG PO TABS: 1.0000 mg | ORAL_TABLET | Freq: Two times a day (BID) | ORAL | 0 refills | Status: DC

## 2017-01-08 MED ORDER — POLYETHYLENE GLYCOL 3350 17 G PO PACK: 17.0000 g | PACK | Freq: Every day | ORAL | 0 refills | Status: DC

## 2017-01-08 MED ORDER — CITALOPRAM HYDROBROMIDE 20 MG PO TABS: 20.0000 mg | ORAL_TABLET | Freq: Every day | ORAL | 0 refills | Status: DC

## 2017-01-08 MED ORDER — NICOTINE POLACRILEX 2 MG MT GUM: 2.0000 mg | CHEWING_GUM | OROMUCOSAL | 0 refills | Status: DC | PRN

## 2017-01-08 MED ORDER — SIMVASTATIN 20 MG PO TABS: 20.0000 mg | ORAL_TABLET | Freq: Every day | ORAL | 0 refills | Status: DC

## 2017-01-08 MED ORDER — INSULIN ASPART 100 UNIT/ML ~~LOC~~ SOLN
15.0000 [IU] | Freq: Once | SUBCUTANEOUS | Status: AC
  Administered 2017-01-08: 15 [IU] via SUBCUTANEOUS

## 2017-01-08 MED ORDER — INSULIN ASPART 100 UNIT/ML ~~LOC~~ SOLN: 8.0000 [IU] | Freq: Three times a day (TID) | SUBCUTANEOUS | 0 refills | Status: DC

## 2017-01-08 MED ORDER — PANTOPRAZOLE SODIUM 40 MG PO TBEC: 40.0000 mg | DELAYED_RELEASE_TABLET | Freq: Every day | ORAL | 0 refills | Status: DC

## 2017-01-08 MED ORDER — INSULIN GLARGINE 100 UNIT/ML ~~LOC~~ SOLN: 45.0000 [IU] | Freq: Every day | SUBCUTANEOUS | 0 refills | Status: DC

## 2017-01-08 MED ORDER — CLOZAPINE 50 MG PO TABS
150.0000 mg | ORAL_TABLET | Freq: Every day | ORAL | 0 refills | Status: DC
Start: 2017-01-08 — End: 2017-02-10

## 2017-01-08 MED ORDER — VITAMIN D3 10 MCG (400 UNIT) PO TABS: 400.0000 [IU] | ORAL_TABLET | Freq: Every day | ORAL | 0 refills | Status: DC

## 2017-01-08 MED ORDER — TEMAZEPAM 7.5 MG PO CAPS: 7.5000 mg | ORAL_CAPSULE | Freq: Every day | ORAL | 0 refills | Status: DC

## 2017-01-08 MED ORDER — METFORMIN HCL 1000 MG PO TABS: 1000.0000 mg | ORAL_TABLET | Freq: Two times a day (BID) | ORAL | 0 refills | Status: DC

## 2017-01-08 MED ORDER — DICLOFENAC SODIUM 1 % TD GEL: 2.0000 g | Freq: Four times a day (QID) | TRANSDERMAL | Status: DC

## 2017-01-08 MED ORDER — AMANTADINE HCL 100 MG PO CAPS: 100.0000 mg | ORAL_CAPSULE | Freq: Two times a day (BID) | ORAL | 0 refills | Status: DC

## 2017-01-08 MED ORDER — PROPRANOLOL HCL 20 MG PO TABS: 20.0000 mg | ORAL_TABLET | Freq: Two times a day (BID) | ORAL | 0 refills | Status: DC

## 2017-01-08 MED ORDER — LISINOPRIL 20 MG PO TABS: 20.0000 mg | ORAL_TABLET | Freq: Every day | ORAL | 0 refills | Status: DC

## 2017-01-08 MED ORDER — MONTELUKAST SODIUM 10 MG PO TABS: 10.0000 mg | ORAL_TABLET | Freq: Every day | ORAL | 0 refills | Status: DC

## 2017-01-08 NOTE — [facility] Suicide Risk Assessment (Signed)
[facility] INPATIENT:  Family/Significant Other Suicide Prevention Education  Suicide Prevention Education: PT. DISCHARGED TO OTHER HEALTHCARE FACILITY:SUICIDE PREVENTION EDUCATION NOT PROVIDED [facility]): [facility], [place] [phone] The patient is discharging to another healthcare facility for continuation of treatment.  The patient's medical information, including suicide ideations and risk factors, are a part of the medical information shared with the receiving healthcare facility.  Tony Cunningham [facility] 01/08/2017, 9:04 AM

## 2017-01-08 NOTE — [facility] Suicide Risk Assessment (Signed)
[facility] Discharge Suicide Risk Assessment   Principal Problem: Schizoaffective disorder, bipolar type [facility]) Discharge Diagnoses:  Patient Active Problem List   Diagnosis Date Noted  . 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 Schizoaffective disorder, bipolar type (HCC) [F25.0] 12/22/2016  . Schizoaffective disorder, depressive type (HCC) [F25.1] 09/23/2016  . Polysubstance abuse [F19.10] 07/17/2016  . Tardive dyskinesia [G24.01] 07/16/2016  . Tobacco use disorder [F17.200] 07/07/2016  . Dyslipidemia [E78.5] 07/07/2016  . Asthma [J45.909] 07/07/2016  . HTN (hypertension) [I10] 07/06/2016  . Diabetes (HCC) [E11.9] 12/25/2010    Total Time spent with patient: 30 minutes  Musculoskeletal: Strength & Muscle Tone: within normal limits Gait & Station: normal Patient leans: [name]  Psychiatric Specialty Exam: ROS  Blood pressure 99/66, pulse (!) 115, temperature 97.8 F (36.6 C), temperature source Oral, resp. rate 18, height 6' (1.829 m), weight 81.4 kg (179 lb 8 oz), SpO2 100 %.Body mass index is 24.34 kg/m.  General Appearance: improved grooming   Eye Contact::  Fair  Speech:  decreased 409  Volume:  Decreased  Mood:  reports he is feeling better than prior to admission  Affect:  blunted, but improves during session, and noted to smile and speak/interact more with staff that he is familiar with   Thought Process:  Linear and Descriptions of Associations: Intact  Orientation:  Other:  oriented to August 2018, and to hospital , [facility]  Thought Content:  denies hallucinations at this time, does not appear internally preoccupied   Suicidal Thoughts:  No denies suicidal or self injurious ideations, denies homicidal ideations  Homicidal Thoughts:  No  Memory:  recent and remote fair  Judgement:  Fair  Insight:  Fair  Psychomotor Activity:  Decreased  Concentration:  Fair  Recall:  [employer] of Knowledge:Fair  Language: Fair  Akathisia:  Negative  Handed:   Right  AIMS (if indicated):     Assets:  Desire for Improvement Resilience  Sleep:  Number of Hours: 5.5  Cognition: Impaired,  Mild  ADL's:  Improved compared to admission   Mental Status Per Nursing Assessment::   On Admission:  NA  Demographic Factors:  41 year old male   Loss Factors: Chronic mental illness   Historical Factors: Has been diagnosed with Schizoaffective Disorder in the past, prior psychiatric admissions, presence of negative symptoms noted by staff   Risk Reduction Factors:   resilience   Continued Clinical Symptoms:  Have reviewed case with CSW, staff- patient scheduled for discharge today - has been accepted to Group Home- patient expresses agreement with this disposition plan Patient presents alert, attentive, improved grooming. He presents calm, cooperative. Speech is limited and answers most questions with monosyllables or head gestures. Of note, when RN and CSW he is familiar with came into room he brightened considerably, smiled and became more verbal. Reports mood as "OK" , affect is blunted, but reactive, currently denies hallucinations. Does not appear internally preoccupied . No delusions are expressed at this time, denies suicidal or self injurious ideations, denies homicidal or violent ideations.  No disruptive or overtly agitated behaviors on unit  FS improved to 205.  PPD was read several days ago- reported as negative . Patient denies medication side effects.   Cognitive Features That Contribute To Risk:  No presentation of delirium at this time- history of negative symptoms  Suicide Risk:  Mild:  Suicidal ideation of limited frequency, intensity, duration, and specificity.  There are no identifiable plans, no associated intent, mild dysphoria and related symptoms, good  self-control (both objective and subjective assessment), few other risk factors, and identifiable protective factors, including available and accessible social  support.  Follow-up Information    [facility] Follow up on 01/14/2017.   Why:  Initial appointment for medications management w Dr Mervyn Skeeters on Thurs 8/23 at 2:30.  In the meantime, you can contact [name], your peer support specialist, at [phone]. Contact information: [address] [place] [address] [phone]        [facility] Follow up on 01/12/2017.   Specialty:  Internal Medicine Why:  Tuesday at 3:15 PM with Denny Peon information: [address] [place] [address] [phone]           Plan Of Care/Follow-up recommendations:  Activity:  as tolerated  Diet:  Diabetic Tests:  NA Other:  see below  Patient is being discharged to Group Home- [facility] Follow up as above for medical and psychiatric management  Craige Cotta, MD 01/08/2017, 12:26 PM

## 2017-01-08 NOTE — Tx Team (Signed)
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 alum & mag hydroxide-simeth (MAALOX/MYLANTA) 200-200-20 MG/5ML suspension 30 mL  30 mL Oral Q6H PRN Patrecia Pour, NP   30 mL at 01/04/17 0820  . amantadine (SYMMETREL) capsule 100 mg  100 mg Oral BID Ursula Alert, MD   100 mg at 01/07/17 1703  . benztropine (COGENTIN) tablet 1 mg  1 mg Oral BID Ursula Alert, MD   1 mg at 01/07/17 1703  . cholecalciferol (VITAMIN D) tablet 400 Units  400 Units Oral Daily Ursula Alert, MD   400 Units at 01/07/17 0810  . citalopram (CELEXA) tablet 20 mg  20 mg Oral Daily Lindell Spar I, NP   20 mg at 01/07/17 0810  . cloZAPine (CLOZARIL) tablet 150 mg  150 mg Oral QHS Derrill Center, NP   150 mg at 01/07/17 2057  . cyclobenzaprine (FLEXERIL) tablet 5 mg  5 mg Oral TID PRN Money, Lowry Ram, FNP   5 mg at 01/07/17 2057  . diclofenac sodium (VOLTAREN) 1 % transdermal gel 2 g  2 g Topical QID Ursula Alert, MD   2 g at 01/07/17 2056  . ibuprofen (ADVIL,MOTRIN) tablet 800 mg  800 mg Oral Q8H PRN Ursula Alert, MD   800 mg at 01/07/17 2210  . insulin aspart (novoLOG) injection 0-15 Units  0-15 Units Subcutaneous TID WC Patrecia Pour, NP   8 Units at 01/07/17 1707  . insulin aspart (novoLOG) injection 0-5 Units  0-5 Units Subcutaneous QHS Patrecia Pour, NP   4 Units at 01/06/17 2233  . insulin aspart (novoLOG) injection 8 Units  8 Units Subcutaneous TID WC Ursula Alert, MD   8 Units at 01/07/17 1706  . insulin glargine (LANTUS) injection 45 Units  45 Units Subcutaneous QHS Lindell Spar I, NP    45 Units at 01/07/17 2059  . lisinopril (PRINIVIL,ZESTRIL) tablet 20 mg  20 mg Oral Daily Patrecia Pour, NP   20 mg at 01/07/17 0810  . magnesium hydroxide (MILK OF MAGNESIA) suspension 30 mL  30 mL Oral Daily PRN Patrecia Pour, NP      . metFORMIN (GLUCOPHAGE) tablet 1,000 mg  1,000 mg Oral BID WC Patrecia Pour, NP   1,000 mg at 01/07/17 1703  . montelukast (SINGULAIR) tablet 10 mg  10 mg Oral QHS Patrecia Pour, NP   10 mg at 01/07/17 2057  . nicotine polacrilex (NICORETTE) gum 2 mg  2 mg Oral PRN Ursula Alert, MD      . ondansetron (ZOFRAN) tablet 4 mg  4 mg Oral Q8H PRN Patrecia Pour, NP      . pantoprazole (PROTONIX) EC tablet 40 mg  40 mg Oral Daily Patrecia Pour, NP   40 mg at 01/07/17 0810  . polyethylene glycol (MIRALAX / GLYCOLAX) packet 17 g  17 g Oral Daily Patrecia Pour, NP   17 g at 01/06/17 0830  . propranolol (INDERAL) tablet 20 mg  20 mg Oral BID Patrecia Pour, NP   20 mg at 01/07/17 1705  . simvastatin (ZOCOR) tablet 20 mg  20 mg Oral q1800 Cobos, Myer Peer, MD  20 mg at 01/07/17 1703  . temazepam (RESTORIL) capsule 7.5 mg  7.5 mg Oral QHS Eappen, Saramma, MD   7.5 mg at 01/07/17 2133    PTA Medications: Prescriptions Prior to Admission  Medication Sig Dispense Refill Last Dose  . cloZAPine (CLOZARIL) 100 MG tablet Take 1 tablet (100 mg total) by mouth at bedtime. 30 tablet 1 Past Week at Unknown time  . fluvoxaMINE (LUVOX) 100 MG tablet Take 1 tablet (100 mg total) by mouth at bedtime. 30 tablet 1 Past Week at Unknown time  . insulin aspart (NOVOLOG) 100 UNIT/ML injection Inject 10 Units into the skin 3 (three) times daily with meals. 10 mL 11 Past Week at Unknown time  . insulin glargine (LANTUS) 100 UNIT/ML injection Inject 0.55 mLs (55 Units total) into the skin at bedtime. 10 mL 11 Past Week at Unknown time  . lisinopril (PRINIVIL,ZESTRIL) 20 MG tablet Take 1 tablet (20 mg total) by mouth daily. 30 tablet 1 Past Week at Unknown time  . meloxicam  (MOBIC) 7.5 MG tablet Take 1 tablet (7.5 mg total) by mouth 2 (two) times daily after a meal. 60 tablet 1 Past Week at Unknown time  . metFORMIN (GLUCOPHAGE) 1000 MG tablet Take 1 tablet (1,000 mg total) by mouth 2 (two) times daily with a meal. 60 tablet 1 Past Week at Unknown time  . montelukast (SINGULAIR) 10 MG tablet Take 1 tablet (10 mg total) by mouth at bedtime. 30 tablet 1 Past Week at Unknown time  . nicotine (NICODERM CQ - DOSED IN MG/24 HR) 7 mg/24hr patch Place 1 patch (7 mg total) onto the skin daily. 28 patch 0 Past Week at Unknown time  . pantoprazole (PROTONIX) 40 MG tablet Take 1 tablet (40 mg total) by mouth daily. 30 tablet 1 Past Week at Unknown time  . polyethylene glycol (MIRALAX / GLYCOLAX) packet Take 17 g by mouth daily. 14 each 1 Past Week at Unknown time  . propranolol (INDERAL) 20 MG tablet Take 1 tablet (20 mg total) by mouth 2 (two) times daily. 60 tablet 1 Past Week at Unknown time  . simvastatin (ZOCOR) 40 MG tablet Take 1 tablet (40 mg total) by mouth daily at 6 PM. 30 tablet 1 Past Week at Unknown time  . temazepam (RESTORIL) 15 MG capsule Take 1 capsule (15 mg total) by mouth at bedtime. 30 capsule 1 Past Week at Unknown time    Patient Stressors: Financial difficulties Medication change or noncompliance  Patient Strengths: Network engineer for treatment/growth Supportive family/friends  Treatment Modalities: Medication Management, Group therapy, Case management,  1 to 1 session with clinician, Psychoeducation, Recreational therapy.   Physician Treatment Plan for Primary Diagnosis: Schizoaffective Disorder, Bipolar Type Long Term Goal(s): Improvement in symptoms so as ready for discharge  Short Term Goals: Ability to identify changes in lifestyle to reduce recurrence of condition will improve Ability to verbalize feelings will improve Ability to disclose and discuss suicidal ideas Ability to demonstrate  self-control will improve Ability to identify and develop effective coping behaviors will improve Compliance with prescribed medications will improve  Medication Management: Evaluate patient's response, side effects, and tolerance of medication regimen.  Therapeutic Interventions: 1 to 1 sessions, Unit Group sessions and Medication administration.  Evaluation of Outcomes: Adequate for Discharge   8/6: Anxiety/HTN: Continue Propranolol 20 mg daily.  Mood Control/Stabilization:  Continue Clozapine (Clozaril) from 164m Q hs.  Depression: Continue Fluvoxamine (Luvox) 100 mg Q hs.  8/9: Patient with  schizoaffective do , past hx of ECT , recent decompensation , seen as having sleep issues, status changed to do not admit as he has been taking roommate's belongings   Clozaril 150 mg po qhs for psychosis. Clozaril level pending. Increase Trazodone to 100 mg po qhs for sleep. Continue Amantadine 100 mg po bid for neuroleptic induced tremors. Will continue Luvox 100 mg po at qhs for affective sx. Propranolol 20 mg po bid for tremors, anxiety.  Insomnia: Continue Trazodone  50 mg prn at bedtime.  8/14: currently making slow progress on the current medications. He does continues to have negative sx , but is making progress .  Pt will do better in a supervised structured place - CSW will work on placement .   Will continue today 01/05/17 plan as below except where it is noted.  -Continue Clozaril 150 mg po qhs for psychosis. Clozaril level - 598 , CBC with diff - wbc - 7.1 , ANC- 3.0 , Repeat CBC with diff every 2 weeks .  -Continue Restoril 7.5 mg po qhs for sleep.  -Continue Amantadine 100 mg po bid for neuroleptic induced tremors.  -Continue Cogentin to 1 mg po bid for EPS.  -Continue Celexa 20 mg po daily for affective sx.  -Continue Propranolol 20 mg po bid for tremors, anxiety.  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  Therapeutic Interventions: 1 to 1 sessions, Unit Group sessions and Medication administration.  Evaluation of Outcomes: Adequate for Discharge   RN Treatment Plan for Primary Diagnosis: Schizoaffective Disorder, Bipolar Type Long Term Goal(s): Knowledge of disease and therapeutic regimen to maintain health will improve  Short Term Goals: Ability to identify and develop effective coping behaviors will improve and Compliance with prescribed medications will improve  Medication Management: RN will administer medications as ordered by provider, will assess and evaluate patient's response and provide education to patient for prescribed medication. RN will report any adverse and/or side effects to prescribing provider.  Therapeutic Interventions: 1 on 1 counseling sessions, Psychoeducation, Medication administration, Evaluate responses to treatment, Monitor vital signs and CBGs as ordered, Perform/monitor CIWA, COWS, AIMS and Fall Risk screenings as ordered, Perform wound care treatments as ordered.  Evaluation of Outcomes: Adequate for Discharge   LCSW Treatment Plan for Primary Diagnosis: Schizoaffective Disorder, Bipolar Type Long Term Goal(s): Safe transition to appropriate next level of care at discharge, Engage patient in therapeutic group  addressing interpersonal concerns.  Short Term Goals: Engage patient in aftercare planning with referrals and resources  Therapeutic Interventions: Assess for all discharge needs, 1 to 1 time with Social worker, Explore available resources and support systems, Assess for adequacy in community support network, Educate family and significant other(s) on suicide prevention, Complete Psychosocial Assessment, Interpersonal group therapy.  Evaluation of Outcomes: Met  [facility], follow up [facility] for peer support   Progress in Treatment: Attending groups: Intermittently Participating in groups: Minimally Taking medication as prescribed: Yes Toleration  medication: Yes, no side effects reported at this time Family/Significant other contact made: No Patient understands diagnosis: Yes AEB asking for help with suicidal thoughts Discussing patient identified problems/goals with staff: Yes Medical problems stabilized or resolved: Yes Denies suicidal/homicidal ideation: Yes Issues/concerns per patient self-inventory: None Other: [name]  New problem(s) identified: None identified at this time.   New Short Term/Long Term Goal(s): "I want to feel better. I'm too sleepy."  Discharge Plan or Barriers: return home w family and follow up outpatient, consider group home placement  Reason for Continuation of Hospitalization:    Estimated Length of Stay:D/C today  Attendees: Patient:  01/08/2017  8:54 AM  Physician: Neita Garnet, MD 01/08/2017  8:54 AM  Nursing: Sena Hitch, RN 01/08/2017  8:54 AM  RN Care Manager: Lars Pinks, RN 01/08/2017  8:54 AM  Social Worker: Ripley Fraise 01/08/2017  8:54 AM  Recreational Therapist: Winfield Cunas 01/08/2017  8:54 AM  Other: Norberto Sorenson 01/08/2017  8:54 AM  Other:  01/08/2017  8:54 AM    Scribe for Treatment Team:  Roque Lias LCSW 01/08/2017 8:54 AM

## 2017-01-08 NOTE — [facility] Group Notes (Signed)
[facility] LCSW Group Therapy  01/08/2017  1:05 PM  Type of Therapy:  Group therapy  Participation Level:  Active  Participation Quality:  Attentive  Affect:  Flat  Cognitive:  Oriented  Insight:  Limited  Engagement in Therapy:  Limited  Modes of Intervention:  Discussion, Socialization  Summary of Progress/Problems:  Chaplain was here to lead a group on themes of hope and courage. "Hope is opening doors so I can move."  This on the heels of finding out he cannot go to the [facility] that he was hoping to go to today.  Cited his faith as the thing that is helping him stay calm and accepting.  [name] 01/08/2017 1:27 PM

## 2017-01-08 NOTE — Progress Notes (Signed)
Data. Patient denies SI/HI/AVH. Patient interacting minimally with staff and other patients. Spending most of shift in his room in bed. Patient's CBG was rechecked at 0936am and was 443. MD notified and order received for one time insulin. CBG at 1058 pm was 313, and 12 noon was 205. Patient was scheduled for discharge this shift, but was unable to be so, due to issues with  the care home not being able to manage his diabetes. Patient reports being, "Very disappointed", but was able to remain appropriate.  Action. Emotional support and encouragement offered. Education provided on medication, indications and side effect. Q 15 minute checks done for safety. Response. Safety on the unit maintained through 15 minute checks.  Medications taken as prescribed. Attended groups. Remained calm and appropriate through out shift.  Pt. discharged to lobby.  Belongings sheet reviewed and signed by pt. and all belongings sent home. Paperwork reviewed and pt. able to verbalize understanding of education. Pt. in no current distress and ambulatory.

## 2017-01-08 NOTE — Progress Notes (Addendum)
  [facility] Adult Case Management Discharge Plan :  Will you be returning to the same living situation after discharge:  No. At discharge, do you have transportation home?: Yes,  [facility] Do you have the ability to pay for your medications: Yes,  MCD  Release of information consent forms completed and in the chart;  Patient's signature needed at discharge.  Patient to Follow up at: Follow-up Information    [facility] Follow up on 01/14/2017.   Why:  Initial appointment for medications management w Dr Mervyn Skeeters on Thurs 8/23 at 2:30.  In the meantime, you can contact [name], your peer support specialist, at [phone]. Contact information: [address] [place] [address] [phone]        [facility] Follow up on 01/12/2017.   Specialty:  Internal Medicine Why:  Tuesday at 3:15 PM with Denny Peon information: [address] [place] [address] [phone]           Next level of care provider has access to [facility] Link:no  Safety Planning and Suicide Prevention discussed: Yes,  yes  Have you used any form of tobacco in the last 30 days? (Cigarettes, Smokeless Tobacco, Cigars, and/or Pipes): Yes  Has patient been referred to the Quitline?: Patient refused referral  Patient has been referred for addiction treatment: [name]  Sallee Lange, LCSW 01/08/2017, 12:16 PM

## 2017-01-08 NOTE — Plan of Care (Signed)
Problem: Activity: Goal: Interest or engagement in activities will improve Outcome: Progressing Patient has spent time in the day room with peers, this shift and attended groups. Goal: Sleeping patterns will improve Outcome: Not Progressing Continues to nap during the day.  Problem: Coping: Goal: Ability to verbalize frustrations and anger appropriately will improve Outcome: Not Progressing Patient was able to verbalize how disappointed he was, not being able to leave today, but was able to remain appropriate. Goal: Ability to demonstrate self-control will improve Outcome: Progressing Remained appropriate throughout shift.  Problem: Safety: Goal: Periods of time without injury will increase Outcome: Progressing No injury this shift.  Problem: Education: Goal: Knowledge of the prescribed therapeutic regimen will improve Outcome: Progressing Taking medications as prescribed.

## 2017-01-08 NOTE — Progress Notes (Signed)
CBG this morning is 70 mg/dL.  On-site provider notified and writer was instructed not to administer any Novolog at this time.  Pt provided with snack.  He reports he is going to "stay back" for breakfast.  Pt denies needs and concerns at this time.

## 2017-01-08 NOTE — Discharge Summary (Signed)
Physician Discharge Summary Note  Patient:  Tony Cunningham is an 41 y.o., male MRN:  [id number] DOB:  1975-12-08  Patient phone:  [phone] (home)   Patient address:   [address] [place] [address],   Total Time spent with patient: Greater than 30 minutes  Date of Admission:  12/22/2016  Date of Discharge: 01-08-17  Reason for Admission: Worsening symptoms of depression & suicidal thoughts with plans to overdose.  Principal Problem: Schizoaffective disorder, bipolar type [facility])  Discharge Diagnoses: Patient Active Problem List   Diagnosis Date Noted  . 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 Schizoaffective disorder, bipolar type (HCC) [F25.0] 12/22/2016  . Schizoaffective disorder, depressive type (HCC) [F25.1] 09/23/2016  . Polysubstance abuse [F19.10] 07/17/2016  . Tardive dyskinesia [G24.01] 07/16/2016  . Tobacco use disorder [F17.200] 07/07/2016  . Dyslipidemia [E78.5] 07/07/2016  . Asthma [J45.909] 07/07/2016  . HTN (hypertension) [I10] 07/06/2016  . Diabetes (HCC) [E11.9] 12/25/2010   Past Psychiatric History: Major depression  Past Medical History:  Past Medical History:  Diagnosis Date  . Anxiety   . Asthma   . Diabetes mellitus   . High blood pressure   . Sinus complaint    History reviewed. No pertinent surgical history.  Family History: History reviewed. No pertinent family history.  Family Psychiatric  History: See H&P  Social History:  History  Alcohol Use No     History  Drug Use No    Social History   Social History  . Marital status: Single    Spouse name: [name]  . Number of children: [name]  . Years of education: [name]   Social History Main Topics  . Smoking status: Current Every Day Smoker    Packs/day: 0.50    Types: Cigarettes  . Smokeless tobacco: Never Used  . Alcohol use No  . Drug use: No  . Sexual activity: Not Currently   Other Topics Concern  . None   Social History Narrative  . None    Hospital Course: This is an admission assessment for this 41 year old Caucasian male with history of mental illness, chronic. He is an established mental health patient at the [facility] in [place], [place] in [place] under the care of Dr. Gerre Pebbles for ECT. He is admitted to the [facility] adult unit from the [facility] with complaints of worsening symptoms of depression with suicidal ideations & plans to overdose on medications. Patient was apparently accompanied to the ED by his peer support specialist. Chart review indicated that patient was receiving outpatient psychiatric services at the [facility] clinic in [place], [place]. Report also indicated that he has been out of his mental health medications for at least 3 days. During this assessment, Tony Cunningham seem out of it. He is lying down in his bed, sleeping. He is alert & easily aroused. He is verbally responsive, however, his speech is very slow & incoherent at times. He is currently a poor historian. He reports, "I have been at the [facility] x 2 weeks. I got discharged on Saturday. When I got home, my depression became worse. He brought me to the hospital because of my behavior. I needed help because I have been suicidal x 5 days".  Tony Cunningham was admitted to the [facility] adult unit with complaints of worsening symptoms of depression triggering suicidal ideations with plans to overdose on medications. He did not cite any related stressors as the trigger. He does have hx of chronic mental illness. He has tried several psychotropic  medications for his mental health issues, yet not improvement has been noted as of yet. He recently completed ECT at the [facility] under the care of Dr. Gerre Pebbles. Per record review, his last ECT was on 12-07-16. Tony Cunningham was in need of mood stabilization treatments.   During the course of his hospitalization, Tony Cunningham received & was  discharged on, Amantadine 100 mg for parkinsonian tremors, Benztropine 1 mg for EPS, Nicorette gum  2 mg for smoking  cessation, Citalopram 20 mg for depression, Clozaril 150 mg for mood control & Restoril 7.5 mg for insomnia. He was enrolled & seldom participated in the group counseling sessions being offered & held on this unit. This is to learn coping skills that should help him cope better & maintain mood stability after discharge. He was resumed on all his pertinent home medications for the other previously existing medical issues that he presented. He tolerated his treatment regimen without any adverse effects or reactions reported.   While his treatment was on going, Tony Cunningham was assessed on daily basis by the clinicians to assure that his symptoms were responding to his treatment regimen. It took quite some time & some medication dose changes to get his symptoms to subside. His improvement was noted by observation & his daily reports of gradual symptom reduction. He was evaluated daily by the treatment team for mood stability & the need for continued recovery after discharge. He was offered further treatment option on an outpatient basis as noted below. And because of the chronic nature of his mental health conditions, limited ability to care for himself & the problem with medication compliance, Tony Cunningham is now placed in a group home setting upon discharge. This is to assure continuity of his mental health, medical health & self care after discharge from here on.      Upon discharge, Tony Cunningham was both mentally & medically stable. He is currently denying suicidal, homicidal ideation, auditory, visual/tactile hallucinations, delusional thoughts & or paranoia. He presents alert, attentive, improved grooming. He is calm & cooperative. Speech is limited and answers most questions with monosyllables or head gestures. Of note, when RN and CSW he is familiar with came into the room, he brightened considerably, smiled and became more verbal. Reports mood as "OK" , affect is blunted & yet, reactive, currently denies hallucinations.  Does not appear internally preoccupied . No delusions are expressed at this time, denies suicidal or self injurious ideations, denies homicidal or violent ideations. No disruptive or overtly agitated behaviors on unit. FS improved to 205. PPD was read several days ago- reported as negative. Tony Cunningham left [facility] with all personal belongings in no apparent distress. Transportation per his group home staff.  Physical Findings: AIMS: Facial and Oral Movements Muscles of Facial Expression: None, normal Lips and Perioral Area: None, normal Jaw: None, normal Tongue: None, normal,Extremity Movements Upper (arms, wrists, hands, fingers): Mild Lower (legs, knees, ankles, toes): None, normal, Trunk Movements Neck, shoulders, hips: None, normal, Overall Severity Severity of abnormal movements (highest score from questions above): Minimal Incapacitation due to abnormal movements: Minimal Patient's awareness of abnormal movements (rate only patient's report): Aware, no distress, Dental Status Current problems with teeth and/or dentures?: No Does patient usually wear dentures?: No  CIWA:  CIWA-Ar Total: 4 COWS:  COWS Total Score: 4  Musculoskeletal: Strength & Muscle Tone: within normal limits Gait & Station: normal Patient leans: [name]  Psychiatric Specialty Exam: Physical Exam  Constitutional: He appears well-developed.  HENT:  Head: Normocephalic.  Eyes: Pupils are equal, round,  and reactive to light.  Neck: Normal range of motion.  Cardiovascular: Normal rate.   Respiratory: Effort normal.  GI: Soft.  Genitourinary:  Genitourinary Comments: Deferred  Musculoskeletal: Normal range of motion.  Neurological: He is alert.  Skin: Skin is warm.    Review of Systems  Constitutional: Negative.   HENT: Negative.   Eyes: Negative.   Respiratory: Negative.   Cardiovascular: Negative.   Gastrointestinal: Negative.   Genitourinary: Negative.   Musculoskeletal: Negative.   Skin: Negative.    Neurological: Negative.   Endo/Heme/Allergies: Negative.   Psychiatric/Behavioral: Positive for depression (Stable ) and hallucinations (Hx. psychosis). Negative for memory loss, substance abuse and suicidal ideas. The patient has insomnia (Stable). The patient is not nervous/anxious.     Blood pressure 99/66, pulse (!) 115, temperature 97.8 F (36.6 C), temperature source Oral, resp. rate 18, height 6' (1.829 m), weight 81.4 kg (179 lb 8 oz), SpO2 100 %.Body mass index is 24.34 kg/m.  See Md's SRA   Have you used any form of tobacco in the last 30 days? (Cigarettes, Smokeless Tobacco, Cigars, and/or Pipes): Yes  Has this patient used any form of tobacco in the last 30 days? (Cigarettes, Smokeless Tobacco, Cigars, and/or Pipes):Yes, provided with nicotine patch prescription.  Blood Alcohol level:  Lab Results  Component Value Date   ETH <5 12/21/2016   ETH <5 10/21/2016   Metabolic Disorder Labs:  Lab Results  Component Value Date   HGBA1C 10.3 (H) 11/09/2016   MPG 249 11/09/2016   MPG 275 10/09/2016   Lab Results  Component Value Date   PROLACTIN 24.5 (H) 09/24/2016   PROLACTIN 3.4 (L) 07/07/2016   Lab Results  Component Value Date   CHOL 129 12/28/2016   TRIG 78 12/28/2016   HDL 54 12/28/2016   CHOLHDL 2.4 12/28/2016   VLDL 16 12/28/2016   LDLCALC 59 12/28/2016   LDLCALC 42 11/09/2016   See Psychiatric Specialty Exam and Suicide Risk Assessment completed by Attending Physician prior to discharge.  Discharge destination:  Other:  [facility]  Is patient on multiple antipsychotic therapies at discharge:  No   Has Patient had three or more failed trials of antipsychotic monotherapy by history:  No  Recommended Plan for Multiple Antipsychotic Therapies: NA Discharge Instructions    Diet - low sodium heart healthy    Complete by:  As directed      Allergies as of 01/08/2017   No Known Allergies     Medication List    STOP taking these medications    fluvoxaMINE 100 MG tablet Commonly known as:  LUVOX   meloxicam 7.5 MG tablet Commonly known as:  MOBIC   nicotine 7 mg/24hr patch Commonly known as:  NICODERM CQ - dosed in mg/24 hr     TAKE these medications     Indication  amantadine 100 MG capsule Commonly known as:  SYMMETREL Take 1 capsule (100 mg total) by mouth 2 (two) times daily. For prevention of drug induced tremors  Indication:  Extrapyramidal Reaction caused by Medications   benztropine 1 MG tablet Commonly known as:  COGENTIN Take 1 tablet (1 mg total) by mouth 2 (two) times daily. For prevention of drug induced tremors  Indication:  Extrapyramidal Reaction caused by Medications   citalopram 20 MG tablet Commonly known as:  CELEXA Take 1 tablet (20 mg total) by mouth daily. For depression  Indication:  Depression   clozapine 50 MG tablet Commonly known as:  CLOZARIL Take 3 tablets (150  mg total) by mouth at bedtime. For mood control What changed:  medication strength  how much to take  additional instructions  Indication:  Schizoaffective Disorder   cyclobenzaprine 5 MG tablet Commonly known as:  FLEXERIL Take 1 tablet (5 mg total) by mouth 3 (three) times daily as needed for muscle spasms.  Indication:  Muscle Spasm   diclofenac sodium 1 % Gel Commonly known as:  VOLTAREN Apply 2 g topically 4 (four) times daily. For arthritis pain  Indication:  Joint Damage causing Pain and Loss of Function   insulin aspart 100 UNIT/ML injection Commonly known as:  novoLOG Inject 8 Units into the skin 3 (three) times daily with meals. For diabetes management What changed:  how much to take  additional instructions  Indication:  Type 2 Diabetes   insulin glargine 100 UNIT/ML injection Commonly known as:  LANTUS Inject 0.45 mLs (45 Units total) into the skin at bedtime. For diabetic management What changed:  how much to take  additional instructions  Indication:  Type 2 Diabetes   lisinopril 20 MG  tablet Commonly known as:  PRINIVIL,ZESTRIL Take 1 tablet (20 mg total) by mouth daily. For high blood pressure What changed:  additional instructions  Indication:  High Blood Pressure Disorder   metFORMIN 1000 MG tablet Commonly known as:  GLUCOPHAGE Take 1 tablet (1,000 mg total) by mouth 2 (two) times daily with a meal. For diabetes management What changed:  additional instructions  Indication:  Type 2 Diabetes   montelukast 10 MG tablet Commonly known as:  SINGULAIR Take 1 tablet (10 mg total) by mouth at bedtime. For shortness of breath What changed:  additional instructions  Indication:  Asthma   nicotine polacrilex 2 MG gum Commonly known as:  NICORETTE Take 1 each (2 mg total) by mouth as needed for smoking cessation.  Indication:  Nicotine Addiction   pantoprazole 40 MG tablet Commonly known as:  PROTONIX Take 1 tablet (40 mg total) by mouth daily. For acid reflux What changed:  additional instructions  Indication:  Gastroesophageal Reflux Disease   polyethylene glycol packet Commonly known as:  MIRALAX / GLYCOLAX Take 17 g by mouth daily. For constipation What changed:  additional instructions  Indication:  Constipation   propranolol 20 MG tablet Commonly known as:  INDERAL Take 1 tablet (20 mg total) by mouth 2 (two) times daily. For high blood pressure What changed:  additional instructions  Indication:  High Blood Pressure Disorder   simvastatin 20 MG tablet Commonly known as:  ZOCOR Take 1 tablet (20 mg total) by mouth daily at 6 PM. For high Cholesterol What changed:  medication strength  how much to take  additional instructions  Indication:  High Amount of Triglycerides in the Blood   temazepam 7.5 MG capsule Commonly known as:  RESTORIL Take 1 capsule (7.5 mg total) by mouth at bedtime. For sleep What changed:  medication strength  how much to take  additional instructions  Indication:  Trouble Sleeping   Vitamin D3 400 units  tablet Take 1 tablet (400 Units total) by mouth daily. For bone health  Indication:  [facility]      Follow-up Information    [facility].   Why:  Your information has been sent to them.  Tony Cunningham will contact Tony Cunningham about your hospital follow up appointment.  In the meantime, you can contact Tony Cunningham, your peer support specialist, at [phone]. Contact information: [address] [employer] [address]  [place] [address] [phone]          Follow-up recommendations: Activity:  As tolerated Diet: As recommended by your primary care doctor. Keep all scheduled follow-up appointments as recommended.  Comments: Patient is instructed prior to discharge to: Take all medications as prescribed by his/her mental healthcare provider. Report any adverse effects and or reactions from the medicines to his/her outpatient provider promptly. Patient has been instructed & cautioned: To not engage in alcohol and or illegal drug use while on prescription medicines. In the event of worsening symptoms, patient is instructed to call the crisis hotline, 911 and or go to the nearest ED for appropriate evaluation and treatment of symptoms. To follow-up with his/her primary care provider for your other medical issues, concerns and or health care needs.   Signed: Sanjuana Kava, NP, PMHNP, FNP-BC 01/08/2017, 9:52 AM

## 2017-01-08 NOTE — Progress Notes (Signed)
D: Pt was in the hallway upon initial approach.  Pt presents with anxious affect and mood.  He is tremulous and reports feeling "anxious."  Pt requests "ativan and haldol" for anxiety.  His goal is to "go to groups."  Pt denies SI/HI, denies hallucinations, reports neck pain of 8/10.  Pt has been visible in milieu interacting with peers and staff appropriately. Pt ate 4 snacks this evening.  His CBG was 94 mg/dL.    A: Introduced self to pt.  Actively listened to pt and offered support and encouragement. Medications administered per order.  PRN medication administered for muscle spasms, pain.  On-site provider notified of pt's increased anxiety and Ativan 2 mg POX1 and Haldol 5 mg POX1 was ordered and administered.  Provider notified of pt's CBG and order to administer 45 units of Lantus.  Provider instructed writer to administer per order.  Q15 minute safety checks maintained.  R: Pt is safe on the unit.  Pt is compliant with medications.  Pt verbally contracts for safety.  Will continue to monitor and assess.

## 2017-01-09 DIAGNOSIS — Z794 Long term (current) use of insulin: Secondary | ICD-10-CM

## 2017-01-09 LAB — GLUCOSE, CAPILLARY
GLUCOSE-CAPILLARY: 276 mg/dL — AB (ref 65–99)
GLUCOSE-CAPILLARY: 264 mg/dL — AB (ref 65–99)
GLUCOSE-CAPILLARY: 87 mg/dL (ref 65–99)
Glucose-Capillary: 46 mg/dL — ABNORMAL LOW (ref 65–99)
Glucose-Capillary: 72 mg/dL (ref 65–99)
Glucose-Capillary: 181 mg/dL — ABNORMAL HIGH (ref 65–99)

## 2017-01-09 MED ORDER — INSULIN GLARGINE 100 UNIT/ML ~~LOC~~ SOLN
35.0000 [IU] | Freq: Once | SUBCUTANEOUS | Status: AC
  Administered 2017-01-09: 35 [IU] via SUBCUTANEOUS

## 2017-01-09 NOTE — Progress Notes (Signed)
Patient  has been up with in the dayroom off and on with minimal interaction with peers. He attended group tonight and took all scheduled medications. He c/o right shoulder pain and received ibuprofen for pain. He denies si/hi/a/v hallucinations. Writer talked with him about his blood sugars being so low in the am and encouraged him to have a snack available in his room at night when he feels his blood sugars are dropping. He was receptive to the idea. Safety maintained on unit with 15 min checks.

## 2017-01-09 NOTE — Progress Notes (Signed)
Patient ID: Tony Cunningham, male   DOB: 08/09/1975, 41 y.o.   MRN: [id number]  D: Patient observed watching TV and interacting well with peers on approach. Pt reports he is doing better. Pt presented with depressed mood and flat affect. Pt attended evening wrap up group and Interacted appropriately with peers. Denies  SI/HI/AVH and pain.No behavioral issues noted.  A: Support and encouragement offered as needed to express needs. Medications administered as prescribed.  R: Patient is safe and cooperative on unit. Will continue to monitor  for safety and stability.

## 2017-01-09 NOTE — Progress Notes (Signed)
Patient ID: Tony Cunningham, male   DOB: 1975/08/21, 41 y.o.   MRN: [id number]  Pt AM CBG was 46. Pt given apple juice. Recheck CBG 15 mins later was 72. Scheduled insulin not given. Pt able to go to breakfast.

## 2017-01-09 NOTE — Progress Notes (Signed)
Patient ID: Tony Cunningham, male   DOB: 07/22/1975, 41 y.o.   MRN: [id number]   Data. Patient denies SI/HI/AVH.  Brightens on approach but minimal interaction with staff and other patients. Spending most of shift in his room in bed.  Refused to do self inventory but did actively engage in group.  Action. Emotional support and encouragement offered. Education provided on medication, indications and side effect.  Engaged pt in 1:1 talks.  Q 15 minute checks done for safety. Response. Safety on the unit maintained through 15 minute checks.  Medications taken as prescribed. Remained calm and appropriate through out shift.  Continuing to monitor.

## 2017-01-09 NOTE — [facility] Group Notes (Signed)
LCSW Group Therapy Note  Date/Time:  01/09/2017   11:15PM-12:00PM  Type of Therapy and Topic:  Group Therapy:  Unhealthy/Healthy Coping Skills  Participation Level:  Active   Description of Group:  The focus of this group was to discuss some of the prevalent life issues that patients experience, and to identify and write on the white board a group-generated list of unhealthy coping and healthy coping techniques to deal with each issue.    Therapeutic Goals: 1. Patient will be able to distinguish between healthy and unhealthy coping skills 2. Patient will identify and describe 2 life issues they experience 3. Patient will identify one positive coping strategy for each life issue they experience 4. Patient will respond empathetically to peers' statements regarding life issues they experience  Summary of Patient Progress:  The patient expressed he would like to learn more relaxation techniques.  He was invested in the discussion and took an active part.  His comments were on target.  Therapeutic Modalities Cognitive Behavioral Therapy Motivational Interviewing  Tony Mantle, LCSW

## 2017-01-09 NOTE — Progress Notes (Signed)
[facility] MD Progress Note  01/09/2017 5:00 PM Shoaib Bargeron  MRN:  [id number]   Subjective: Patient states "they cancelled my discharge because they dont give insulin in the home but [facility]Ob said it should be on Monday I can leave"  Objective: Patient seen and chart reviewed. Discussed patient with treatment team. Patient has been staying in his room this morning, less talkative and delayed responses. He is awake, alert, and good eye contact, and more spontaneous speech. He has an improvement in his memory recall at this time. He is observed lying in bed, and is calm and cooperative.  Patient continues to need support. He has no tremors or akathisia. He is compliant with medications. As noted patient discharge was cancelled because the ALF was unable to provide insulin care.   Principal Problem: Schizoaffective disorder, bipolar type (HCC)  Diagnosis:   Patient Active Problem List   Diagnosis Date Noted  . 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 Schizoaffective disorder, bipolar type (HCC) [F25.0] 12/22/2016  . Schizoaffective disorder, depressive type (HCC) [F25.1] 09/23/2016  . Polysubstance abuse [F19.10] 07/17/2016  . Tardive dyskinesia [G24.01] 07/16/2016  . Tobacco use disorder [F17.200] 07/07/2016  . Dyslipidemia [E78.5] 07/07/2016  . Asthma [J45.909] 07/07/2016  . HTN (hypertension) [I10] 07/06/2016  . Diabetes (HCC) [E11.9] 12/25/2010   Total Time spent with patient: 20 minutes  Past Psychiatric History: Please see H&P.   Past Medical History:  Past Medical History:  Diagnosis Date  . Anxiety   . Asthma   . Diabetes mellitus   . High blood pressure   . Sinus complaint    History reviewed. No pertinent surgical history. Family History: Please see H&P.  Family Psychiatric  History: Please see H&P.  Social History:  History  Alcohol Use No     History  Drug Use No    Social History   Social History  . Marital status: Single    Spouse name: [name]  .  Number of children: [name]  . Years of education: [name]   Social History Main Topics  . Smoking status: Current Every Day Smoker    Packs/day: 0.50    Types: Cigarettes  . Smokeless tobacco: Never Used  . Alcohol use No  . Drug use: No  . Sexual activity: Not Currently   Other Topics Concern  . None   Social History Narrative  . None   Additional Social History:   Sleep: Fair  Appetite:  Fair  Current Medications: Current Facility-Administered Medications  Medication Dose Route Frequency Provider Last Rate Last Dose  . alum & mag hydroxide-simeth (MAALOX/MYLANTA) 200-200-20 MG/5ML suspension 30 mL  30 mL Oral Q6H PRN Charm Rings, NP   30 mL at 01/08/17 2320  . amantadine (SYMMETREL) capsule 100 mg  100 mg Oral BID Jomarie Longs, MD   100 mg at 01/09/17 0808  . benztropine (COGENTIN) tablet 1 mg  1 mg Oral BID Jomarie Longs, MD   1 mg at 01/09/17 0809  . cholecalciferol (VITAMIN D) tablet 400 Units  400 Units Oral Daily Jomarie Longs, MD   400 Units at 01/09/17 0808  . citalopram (CELEXA) tablet 20 mg  20 mg Oral Daily Armandina Stammer I, NP   20 mg at 01/09/17 0809  . cloZAPine (CLOZARIL) tablet 150 mg  150 mg Oral QHS Oneta Rack, NP   150 mg at 01/08/17 2116  . cyclobenzaprine (FLEXERIL) tablet 5 mg  5 mg Oral TID PRN Money, Gerlene Burdock, FNP   5 mg  at 01/08/17 1607  . diclofenac sodium (VOLTAREN) 1 % transdermal gel 2 g  2 g Topical QID Jomarie Longs, MD   2 g at 01/08/17 1707  . ibuprofen (ADVIL,MOTRIN) tablet 800 mg  800 mg Oral Q8H PRN Jomarie Longs, MD   800 mg at 01/08/17 1713  . insulin aspart (novoLOG) injection 0-15 Units  0-15 Units Subcutaneous TID WC Charm Rings, NP   8 Units at 01/09/17 1204  . insulin aspart (novoLOG) injection 0-5 Units  0-5 Units Subcutaneous QHS Charm Rings, NP   4 Units at 01/06/17 2233  . insulin aspart (novoLOG) injection 8 Units  8 Units Subcutaneous TID WC Jomarie Longs, MD   8 Units at 01/09/17 1205  . insulin glargine  (LANTUS) injection 45 Units  45 Units Subcutaneous QHS Armandina Stammer I, NP   45 Units at 01/08/17 2035  . lisinopril (PRINIVIL,ZESTRIL) tablet 20 mg  20 mg Oral Daily Charm Rings, NP   20 mg at 01/09/17 0630  . magnesium hydroxide (MILK OF MAGNESIA) suspension 30 mL  30 mL Oral Daily PRN Charm Rings, NP      . metFORMIN (GLUCOPHAGE) tablet 1,000 mg  1,000 mg Oral BID WC Charm Rings, NP   1,000 mg at 01/09/17 0809  . montelukast (SINGULAIR) tablet 10 mg  10 mg Oral QHS Charm Rings, NP   10 mg at 01/08/17 2116  . nicotine polacrilex (NICORETTE) gum 2 mg  2 mg Oral PRN Jomarie Longs, MD      . ondansetron (ZOFRAN) tablet 4 mg  4 mg Oral Q8H PRN Charm Rings, NP      . pantoprazole (PROTONIX) EC tablet 40 mg  40 mg Oral Daily Charm Rings, NP   40 mg at 01/09/17 1601  . polyethylene glycol (MIRALAX / GLYCOLAX) packet 17 g  17 g Oral Daily Charm Rings, NP   17 g at 01/06/17 0830  . propranolol (INDERAL) tablet 20 mg  20 mg Oral BID Charm Rings, NP   20 mg at 01/09/17 0932  . simvastatin (ZOCOR) tablet 20 mg  20 mg Oral q1800 Cobos, Rockey Situ, MD   20 mg at 01/08/17 1708  . temazepam (RESTORIL) capsule 7.5 mg  7.5 mg Oral QHS Eappen, Saramma, MD   7.5 mg at 01/08/17 2115   Lab Results:  Results for orders placed or performed during the hospital encounter of 12/22/16 (from the past 48 hour(s))  Glucose, capillary     Status: Abnormal   Collection Time: 01/07/17  5:02 PM  Result Value Ref Range   Glucose-Capillary 268 (H) 65 - 99 mg/dL   Comment 1 Notify RN    Comment 2 Document in Chart   Glucose, capillary     Status: None   Collection Time: 01/07/17  8:33 PM  Result Value Ref Range   Glucose-Capillary 94 65 - 99 mg/dL  Glucose, capillary     Status: Abnormal   Collection Time: 01/08/17  9:36 AM  Result Value Ref Range   Glucose-Capillary 443 (H) 65 - 99 mg/dL  Glucose, capillary     Status: Abnormal   Collection Time: 01/08/17 10:58 AM  Result Value Ref Range    Glucose-Capillary 313 (H) 65 - 99 mg/dL  Glucose, capillary     Status: Abnormal   Collection Time: 01/08/17 12:15 PM  Result Value Ref Range   Glucose-Capillary 205 (H) 65 - 99 mg/dL  Glucose, capillary  Status: Abnormal   Collection Time: 01/08/17  4:56 PM  Result Value Ref Range   Glucose-Capillary 245 (H) 65 - 99 mg/dL  Glucose, capillary     Status: Abnormal   Collection Time: 01/08/17  8:27 PM  Result Value Ref Range   Glucose-Capillary 145 (H) 65 - 99 mg/dL  Glucose, capillary     Status: Abnormal   Collection Time: 01/09/17  6:34 AM  Result Value Ref Range   Glucose-Capillary 46 (L) 65 - 99 mg/dL  Glucose, capillary     Status: None   Collection Time: 01/09/17  6:46 AM  Result Value Ref Range   Glucose-Capillary 72 65 - 99 mg/dL  Glucose, capillary     Status: Abnormal   Collection Time: 01/09/17 12:01 PM  Result Value Ref Range   Glucose-Capillary 276 (H) 65 - 99 mg/dL  Glucose, capillary     Status: Abnormal   Collection Time: 01/09/17  4:41 PM  Result Value Ref Range   Glucose-Capillary 264 (H) 65 - 99 mg/dL   Comment 1 Notify RN    Comment 2 Document in Chart    Blood Alcohol level:  Lab Results  Component Value Date   ETH <5 12/21/2016   ETH <5 10/21/2016   Metabolic Disorder Labs: Lab Results  Component Value Date   HGBA1C 10.3 (H) 11/09/2016   MPG 249 11/09/2016   MPG 275 10/09/2016   Lab Results  Component Value Date   PROLACTIN 24.5 (H) 09/24/2016   PROLACTIN 3.4 (L) 07/07/2016   Lab Results  Component Value Date   CHOL 129 12/28/2016   TRIG 78 12/28/2016   HDL 54 12/28/2016   CHOLHDL 2.4 12/28/2016   VLDL 16 12/28/2016   LDLCALC 59 12/28/2016   LDLCALC 42 11/09/2016   Physical Findings: AIMS: Facial and Oral Movements Muscles of Facial Expression: None, normal Lips and Perioral Area: None, normal Jaw: None, normal Tongue: None, normal,Extremity Movements Upper (arms, wrists, hands, fingers): Mild Lower (legs, knees, ankles,  toes): None, normal, Trunk Movements Neck, shoulders, hips: None, normal, Overall Severity Severity of abnormal movements (highest score from questions above): Mild Incapacitation due to abnormal movements: Minimal Patient's awareness of abnormal movements (rate only patient's report): Aware, no distress, Dental Status Current problems with teeth and/or dentures?: No Does patient usually wear dentures?: No  CIWA:  CIWA-Ar Total: 4 COWS:  COWS Total Score: 4  Musculoskeletal: Strength & Muscle Tone: within normal limits Gait & Station: normal Patient leans: [name]  Psychiatric Specialty Exam: Physical Exam  Nursing note and vitals reviewed.   Review of Systems  Musculoskeletal: Positive for neck pain.  Psychiatric/Behavioral: Positive for depression and hallucinations. The patient is nervous/anxious.   All other systems reviewed and are negative.   Blood pressure 121/80, pulse 85, temperature 97.8 F (36.6 C), temperature source Oral, resp. rate 18, height 6' (1.829 m), weight 81.4 kg (179 lb 8 oz), SpO2 100 %.Body mass index is 24.34 kg/m.  General Appearance: Guarded  Eye Contact:  Fair  Speech:  Normal Rate  Volume:  Decreased  Mood:  Depressed  Affect:  Congruent  Thought Process:  Linear and Descriptions of Associations: Circumstantial  Orientation:  Full (Time, Place, and Person)  Thought Content:  Paranoid Ideation, AH improving  Suicidal Thoughts:  No  Homicidal Thoughts:  No  Memory:  Immediate;   Fair Recent;   Fair Remote;   Fair  Judgement:  Fair  Insight:  Fair  Psychomotor Activity:  Tremor  Concentration:  Concentration:  Fair and Attention Span: Fair  Recall:  [employer] of Knowledge:  Fair  Language:  Fair  Akathisia:  No  Handed:  Right  AIMS (if indicated):      Assets:  Desire for Improvement Resilience Social Support  ADL's:  Intact  Cognition:  WNL  Sleep:  Number of Hours: 5   Treatment Plan Summary: Patient with schizoaffective do , hx of  past ECT treatments , currently making progress, his parkinsonian sx , likely neuroleptic induced continues to be the same , is not worsening, currently on medications to help with the same. Patient with improved sleep as well as affect, his negative sx are improving.  Continue current treatment Plan: Will continue today 01/09/17  plan as below except where it is noted. Daily contact with patient to assess and evaluate symptoms and progress in treatment and Medication management   -Continue treatment plan and medication management as below:  -Continue Clozaril 150 mg po qhs for psychosis. Clozaril level - 598 , CBC with diff - wbc - 7.1 , ANC- 3.0 , Repeat CBC with diff every 2 weeks .  -Continue Restoril 7.5 mg po qhs for sleep.  -Continue Amantadine 100 mg po bid for neuroleptic induced tremors.  -Continue Cogentin 1 mg po bid for EPS.  -Continue Celexa 20 mg po daily for affective sx.  -Continue Propranolol 20 mg po bid for tremors, anxiety.  -Continue Flexeril 5 mg po tid prn for muscle spasms , motrin 800 mg po q8h prn for pain.  -Continue Novolog Insulin as per diabetic consult recommendations.  -Vitamin D replacement for low Vitamin D level.  Continue to monitor vitals ,medication compliance and treatment side effects.   Will monitor for medical issues as well as call consult as needed.  Reviewed labs tsh -wnl, vitamin b12 - wnl.  CSW will continue working on disposition. Referral  to [facility] in progress.[facility] staff to visit patient today. Patient encourage to participate in therapeutic milieu .   Truman Hayward, FNP, 01/09/2017, 5:00 PM

## 2017-01-10 LAB — URINALYSIS, DIPSTICK ONLY
Bacteria, UA: NONE SEEN
Bilirubin Urine: NEGATIVE
Glucose, UA: NEGATIVE mg/dL
HGB URINE DIPSTICK: NEGATIVE
Ketones, ur: NEGATIVE mg/dL
Leukocytes, UA: NEGATIVE
Nitrite: NEGATIVE
Protein, ur: NEGATIVE mg/dL
SPECIFIC GRAVITY, URINE: 1.015 (ref 1.005–1.030)
pH: 7 (ref 5.0–8.0)

## 2017-01-10 LAB — CBC WITH DIFFERENTIAL/PLATELET
BASOS ABS: 0.1 10*3/uL (ref 0.0–0.1)
BASOS PCT: 1 %
EOS PCT: 4 %
Eosinophils Absolute: 0.3 10*3/uL (ref 0.0–0.7)
HEMATOCRIT: 39.2 % (ref 39.0–52.0)
Hemoglobin: 13.6 g/dL (ref 13.0–17.0)
Lymphocytes Relative: 40 %
Lymphs Abs: 2.7 10*3/uL (ref 0.7–4.0)
MCH: 31.2 pg (ref 26.0–34.0)
MCHC: 34.7 g/dL (ref 30.0–36.0)
MCV: 89.9 fL (ref 78.0–100.0)
MONO ABS: 0.5 10*3/uL (ref 0.1–1.0)
MONOS PCT: 8 %
NEUTROS ABS: 3.2 10*3/uL (ref 1.7–7.7)
Neutrophils Relative %: 47 %
PLATELETS: 176 10*3/uL (ref 150–400)
RBC: 4.36 MIL/uL (ref 4.22–5.81)
RDW: 12.9 % (ref 11.5–15.5)
WBC: 6.7 10*3/uL (ref 4.0–10.5)

## 2017-01-10 LAB — BASIC METABOLIC PANEL
ANION GAP: 7 (ref 5–15)
BUN: 10 mg/dL (ref 6–20)
CALCIUM: 8.9 mg/dL (ref 8.9–10.3)
CO2: 31 mmol/L (ref 22–32)
CREATININE: 1.03 mg/dL (ref 0.61–1.24)
Chloride: 96 mmol/L — ABNORMAL LOW (ref 101–111)
GLUCOSE: 381 mg/dL — AB (ref 65–99)
Potassium: 4.4 mmol/L (ref 3.5–5.1)
Sodium: 134 mmol/L — ABNORMAL LOW (ref 135–145)

## 2017-01-10 LAB — GLUCOSE, CAPILLARY
GLUCOSE-CAPILLARY: 179 mg/dL — AB (ref 65–99)
GLUCOSE-CAPILLARY: 354 mg/dL — AB (ref 65–99)
GLUCOSE-CAPILLARY: 300 mg/dL — AB (ref 65–99)
Glucose-Capillary: 92 mg/dL (ref 65–99)
Glucose-Capillary: 413 mg/dL — ABNORMAL HIGH (ref 65–99)
Glucose-Capillary: 449 mg/dL — ABNORMAL HIGH (ref 65–99)
Glucose-Capillary: 339 mg/dL — ABNORMAL HIGH (ref 65–99)

## 2017-01-10 NOTE — Progress Notes (Signed)
Adult Psychoeducational Group Note  Date:  01/10/2017 Time:  10:20 PM  Group Topic/Focus:  Wrap-Up Group:   The focus of this group is to help patients review their daily goal of treatment and discuss progress on daily workbooks.  Participation Level:  Minimal  Participation Quality:  Appropriate  Affect:  Appropriate  Cognitive:  Confused  Insight: Limited  Engagement in Group:  Engaged  Modes of Intervention:  Socialization and Support  Additional Comments:  Patient attended and participated in group tonight. He reports having a OK day. He advised that he is not feeling so well now. It may have to do with his blood sugar being so high. Today her went to the gym, he went for meals and attended groups.  Lita Mains [facility] 01/10/2017, 10:20 PM

## 2017-01-10 NOTE — Progress Notes (Signed)
Writer has observed patient up in the dayroom off and on and attended group. He came out of group reporting that he did not feel well and was advised to try and lie down after lantus for his blood sugar but he decided to return to group. He reported to Clinical research associate that he was anxious about going to the group home on tomorrow. He had talked to his mother on the phone and became tearful. Writer encouraged him to use his coping skills and he worked his crossword puzzle and remained in the dayroom. Writer encouraged him to think positive about his new transition. Support given and safety maintained on unit with 15 min check.

## 2017-01-10 NOTE — Progress Notes (Signed)
Patient ID: Tony Cunningham, male   DOB: [birth year], 41 y.o.   MRN: [id number]   Pts blood sugar after dinner was 354, Takia NP made aware.

## 2017-01-10 NOTE — [facility] Group Notes (Signed)
[facility] LCSW Group Therapy Note  Date/Time:  01/10/2017  11:00AM-12:00PM  Type of Therapy and Topic:  Group Therapy:  Music and Mood  Participation Level:  Did Not Attend   Description of Group: In this process group, members listened to a variety of genres of music and identified that different types of music evoke different responses.  Patients were encouraged to identify music that was soothing for them and music that was energizing for them.  Patients discussed how this knowledge can help with wellness and recovery in various ways including managing depression and anxiety as well as encouraging healthy sleep habits.    Therapeutic Goals: 1. Patients will explore the impact of different varieties of music on mood 2. Patients will verbalize the thoughts they have when listening to different types of music 3. Patients will identify music that is soothing to them as well as music that is energizing to them 4. Patients will discuss how to use this knowledge to assist in maintaining wellness and recovery 5. Patients will explore the use of music as a coping skill  Summary of Patient Progress:  [name]  Therapeutic Modalities: Solution Focused Brief Therapy Motivational Interviewing Activity   Ambrose Mantle, LCSW 01/10/2017 12:25 PM

## 2017-01-10 NOTE — Progress Notes (Signed)
[facility] MD Progress Note  01/10/2017 10:32 AM Fanuel Dornbush  MRN:  [id number]   Subjective: Patient states "I feel so-so. I eel better. I am hoping to go home. "  Per nursing:Patient  has been up with in the dayroom off and on with minimal interaction with peers. He attended group tonight and took all scheduled medications. He c/o right shoulder pain and received ibuprofen for pain. He denies si/hi/a/v hallucinations. Writer talked with him about his blood sugars being so low in the am and encouraged him to have a snack available in his room at night when he feels his blood sugars are dropping. He was receptive to the idea.  Objective: Patient seen and chart reviewed. Discussed patient with treatment team. Patient is observed lying in his bed during this evaluation however he is appropriate with writer and will sit up when directed. Although there has been significant improvement in his psychological conditions, he continues to have uncontrolled and erratic blood sugars. His confusion and altered mental status is sometimes impacted by low blood sugars. He is awake, alert, and good eye contact, and more spontaneous speech although delayed responses. He has an improvement in his memory recall at this time.  Patient continues to need support and increased nursing care services. He has no tremors or akathisia. He is compliant with medications. As noted patient discharge was cancelled because the ALF was unable to provide insulin care.   Principal Problem: Schizoaffective disorder, bipolar type (HCC)  Diagnosis:   Patient Active Problem List   Diagnosis Date Noted  . 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 Schizoaffective disorder, bipolar type (HCC) [F25.0] 12/22/2016  . Schizoaffective disorder, depressive type (HCC) [F25.1] 09/23/2016  . Polysubstance abuse [F19.10] 07/17/2016  . Tardive dyskinesia [G24.01] 07/16/2016  . Tobacco use disorder [F17.200] 07/07/2016  . Dyslipidemia  [E78.5] 07/07/2016  . Asthma [J45.909] 07/07/2016  . HTN (hypertension) [I10] 07/06/2016  . Diabetes (HCC) [E11.9] 12/25/2010   Total Time spent with patient: 20 minutes  Past Psychiatric History: Please see H&P.   Past Medical History:  Past Medical History:  Diagnosis Date  . Anxiety   . Asthma   . Diabetes mellitus   . High blood pressure   . Sinus complaint    History reviewed. No pertinent surgical history. Family History: Please see H&P.  Family Psychiatric  History: Please see H&P.  Social History:  History  Alcohol Use No     History  Drug Use No    Social History   Social History  . Marital status: Single    Spouse name: [name]  . Number of children: [name]  . Years of education: [name]   Social History Main Topics  . Smoking status: Current Every Day Smoker    Packs/day: 0.50    Types: Cigarettes  . Smokeless tobacco: Never Used  . Alcohol use No  . Drug use: No  . Sexual activity: Not Currently   Other Topics Concern  . None   Social History Narrative  . None   Additional Social History:   Sleep: Fair  Appetite:  Fair  Current Medications: Current Facility-Administered Medications  Medication Dose Route Frequency Provider Last Rate Last Dose  . alum & mag hydroxide-simeth (MAALOX/MYLANTA) 200-200-20 MG/5ML suspension 30 mL  30 mL Oral Q6H PRN Charm Rings, NP   30 mL at 01/08/17 2320  . amantadine (SYMMETREL) capsule 100 mg  100 mg Oral BID Jomarie Longs, MD   100 mg at 01/09/17 1726  .  benztropine (COGENTIN) tablet 1 mg  1 mg Oral BID Jomarie Longs, MD   1 mg at 01/09/17 1730  . cholecalciferol (VITAMIN D) tablet 400 Units  400 Units Oral Daily Jomarie Longs, MD   400 Units at 01/09/17 0808  . citalopram (CELEXA) tablet 20 mg  20 mg Oral Daily Armandina Stammer I, NP   20 mg at 01/09/17 0809  . cloZAPine (CLOZARIL) tablet 150 mg  150 mg Oral QHS Oneta Rack, NP   150 mg at 01/09/17 2119  . cyclobenzaprine (FLEXERIL) tablet 5 mg  5 mg Oral  TID PRN Money, Gerlene Burdock, FNP   5 mg at 01/08/17 1607  . diclofenac sodium (VOLTAREN) 1 % transdermal gel 2 g  2 g Topical QID Eappen, Saramma, MD   2 g at 01/09/17 2119  . ibuprofen (ADVIL,MOTRIN) tablet 800 mg  800 mg Oral Q8H PRN Jomarie Longs, MD   800 mg at 01/09/17 1946  . insulin aspart (novoLOG) injection 0-15 Units  0-15 Units Subcutaneous TID WC Charm Rings, NP   3 Units at 01/10/17 [phone]  . insulin aspart (novoLOG) injection 0-5 Units  0-5 Units Subcutaneous QHS Charm Rings, NP   Stopped at 01/09/17 2042  . insulin aspart (novoLOG) injection 8 Units  8 Units Subcutaneous TID WC Jomarie Longs, MD   8 Units at 01/10/17 0635  . insulin glargine (LANTUS) injection 45 Units  45 Units Subcutaneous QHS Armandina Stammer I, NP   Stopped at 01/09/17 2036  . lisinopril (PRINIVIL,ZESTRIL) tablet 20 mg  20 mg Oral Daily Charm Rings, NP   20 mg at 01/09/17 8295  . magnesium hydroxide (MILK OF MAGNESIA) suspension 30 mL  30 mL Oral Daily PRN Charm Rings, NP      . metFORMIN (GLUCOPHAGE) tablet 1,000 mg  1,000 mg Oral BID WC Charm Rings, NP   1,000 mg at 01/09/17 1730  . montelukast (SINGULAIR) tablet 10 mg  10 mg Oral QHS Charm Rings, NP   10 mg at 01/09/17 2119  . nicotine polacrilex (NICORETTE) gum 2 mg  2 mg Oral PRN Jomarie Longs, MD      . ondansetron (ZOFRAN) tablet 4 mg  4 mg Oral Q8H PRN Charm Rings, NP      . pantoprazole (PROTONIX) EC tablet 40 mg  40 mg Oral Daily Charm Rings, NP   40 mg at 01/09/17 6213  . polyethylene glycol (MIRALAX / GLYCOLAX) packet 17 g  17 g Oral Daily Charm Rings, NP   17 g at 01/06/17 0830  . propranolol (INDERAL) tablet 20 mg  20 mg Oral BID Charm Rings, NP   20 mg at 01/09/17 1728  . simvastatin (ZOCOR) tablet 20 mg  20 mg Oral q1800 Cobos, Rockey Situ, MD   20 mg at 01/09/17 1730  . temazepam (RESTORIL) capsule 7.5 mg  7.5 mg Oral QHS Eappen, Saramma, MD   7.5 mg at 01/09/17 2119   Lab Results:  Results for orders placed  or performed during the hospital encounter of 12/22/16 (from the past 48 hour(s))  Glucose, capillary     Status: Abnormal   Collection Time: 01/08/17 10:58 AM  Result Value Ref Range   Glucose-Capillary 313 (H) 65 - 99 mg/dL  Glucose, capillary     Status: Abnormal   Collection Time: 01/08/17 12:15 PM  Result Value Ref Range   Glucose-Capillary 205 (H) 65 - 99 mg/dL  Glucose, capillary  Status: Abnormal   Collection Time: 01/08/17  4:56 PM  Result Value Ref Range   Glucose-Capillary 245 (H) 65 - 99 mg/dL  Glucose, capillary     Status: Abnormal   Collection Time: 01/08/17  8:27 PM  Result Value Ref Range   Glucose-Capillary 145 (H) 65 - 99 mg/dL  Glucose, capillary     Status: Abnormal   Collection Time: 01/09/17  6:34 AM  Result Value Ref Range   Glucose-Capillary 46 (L) 65 - 99 mg/dL  Glucose, capillary     Status: None   Collection Time: 01/09/17  6:46 AM  Result Value Ref Range   Glucose-Capillary 72 65 - 99 mg/dL  Glucose, capillary     Status: Abnormal   Collection Time: 01/09/17 12:01 PM  Result Value Ref Range   Glucose-Capillary 276 (H) 65 - 99 mg/dL  Glucose, capillary     Status: Abnormal   Collection Time: 01/09/17  4:41 PM  Result Value Ref Range   Glucose-Capillary 264 (H) 65 - 99 mg/dL   Comment 1 Notify RN    Comment 2 Document in Chart   Glucose, capillary     Status: None   Collection Time: 01/09/17  7:40 PM  Result Value Ref Range   Glucose-Capillary 87 65 - 99 mg/dL   Comment 1 Notify RN   Glucose, capillary     Status: Abnormal   Collection Time: 01/09/17  9:17 PM  Result Value Ref Range   Glucose-Capillary 181 (H) 65 - 99 mg/dL  Glucose, capillary     Status: Abnormal   Collection Time: 01/10/17  6:03 AM  Result Value Ref Range   Glucose-Capillary 179 (H) 65 - 99 mg/dL   Comment 1 Notify RN    Blood Alcohol level:  Lab Results  Component Value Date   ETH <5 12/21/2016   ETH <5 10/21/2016   Metabolic Disorder Labs: Lab Results   Component Value Date   HGBA1C 10.3 (H) 11/09/2016   MPG 249 11/09/2016   MPG 275 10/09/2016   Lab Results  Component Value Date   PROLACTIN 24.5 (H) 09/24/2016   PROLACTIN 3.4 (L) 07/07/2016   Lab Results  Component Value Date   CHOL 129 12/28/2016   TRIG 78 12/28/2016   HDL 54 12/28/2016   CHOLHDL 2.4 12/28/2016   VLDL 16 12/28/2016   LDLCALC 59 12/28/2016   LDLCALC 42 11/09/2016   Physical Findings: AIMS: Facial and Oral Movements Muscles of Facial Expression: None, normal Lips and Perioral Area: None, normal Jaw: None, normal Tongue: None, normal,Extremity Movements Upper (arms, wrists, hands, fingers): Mild Lower (legs, knees, ankles, toes): None, normal, Trunk Movements Neck, shoulders, hips: None, normal, Overall Severity Severity of abnormal movements (highest score from questions above): Mild Incapacitation due to abnormal movements: Minimal Patient's awareness of abnormal movements (rate only patient's report): Aware, no distress, Dental Status Current problems with teeth and/or dentures?: No Does patient usually wear dentures?: No  CIWA:  CIWA-Ar Total: 4 COWS:  COWS Total Score: 4  Musculoskeletal: Strength & Muscle Tone: within normal limits Gait & Station: normal Patient leans: [name]  Psychiatric Specialty Exam: Physical Exam  Nursing note and vitals reviewed.   Review of Systems  Musculoskeletal: Positive for neck pain.  Psychiatric/Behavioral: Positive for depression and hallucinations. The patient is nervous/anxious.   All other systems reviewed and are negative.   Blood pressure 111/83, pulse 94, temperature 98.7 F (37.1 C), temperature source Oral, resp. rate 18, height 6' (1.829 m), weight 81.4 kg (  179 lb 8 oz), SpO2 100 %.Body mass index is 24.34 kg/m.  General Appearance: Guarded  Eye Contact:  Fair  Speech:  Normal Rate  Volume:  Decreased  Mood:  Depressed  Affect:  Congruent  Thought Process:  Linear and Descriptions of  Associations: Circumstantial  Orientation:  Full (Time, Place, and Person)  Thought Content:  Paranoid Ideation, AH improving  Suicidal Thoughts:  No  Homicidal Thoughts:  No  Memory:  Immediate;   Fair Recent;   Fair Remote;   Fair  Judgement:  Fair  Insight:  Fair  Psychomotor Activity:  Tremor  Concentration:  Concentration: Fair and Attention Span: Fair  Recall:  [employer] of Knowledge:  Fair  Language:  Fair  Akathisia:  No  Handed:  Right  AIMS (if indicated):      Assets:  Desire for Improvement Resilience Social Support  ADL's:  Intact  Cognition:  WNL  Sleep:  Number of Hours: 5   Treatment Plan Summary: Patient with schizoaffective do , hx of past ECT treatments , currently making progress, his parkinsonian sx , likely neuroleptic induced continues to be the same , is not worsening, currently on medications to help with the same. Patient with improved sleep as well as affect, his negative sx are improving.  Continue current treatment Plan: Will continue today 01/10/17  plan as below except where it is noted. Daily contact with patient to assess and evaluate symptoms and progress in treatment and Medication management   -Continue treatment plan and medication management as below:  -Continue Clozaril 150 mg po qhs for psychosis. Clozaril level - 598 , CBC with diff - wbc - 7.1 , ANC- 3.0 , Repeat CBC with diff every 2 weeks .  -Continue Restoril 7.5 mg po qhs for sleep.  -Continue Amantadine 100 mg po bid for neuroleptic induced tremors.  -Continue Cogentin 1 mg po bid for EPS.  -Continue Celexa 20 mg po daily for affective sx.  -Continue Propranolol 20 mg po bid for tremors, anxiety.  -Continue Flexeril 5 mg po tid prn for muscle spasms , motrin 800 mg po q8h prn for pain.  -Continue Novolog Insulin as per diabetic consult recommendations. Patient to continues to have erratic and uncontrolled blood sugars, consider switching to ultra long acting insulin, 70/30  mix, or U-500. Patient receiving large amounts of insulin several times a day and unable to achieve steady state. Unable to control carb intake in the hospital may be better suited outpatient changes under Endo and CDE.   -Vitamin D replacement for low Vitamin D level.  Continue to monitor vitals ,medication compliance and treatment side effects.   Will monitor for medical issues as well as call consult as needed.  Reviewed labs tsh -wnl, vitamin b12 - wnl.  CSW will continue working on disposition. Referral  to [facility] in progress.[facility] staff to visit patient today. Patient encourage to participate in therapeutic milieu .   Truman Hayward, FNP, 01/10/2017, 10:32 AM

## 2017-01-10 NOTE — Progress Notes (Signed)
Patient ID: Tony Cunningham, male   DOB: May 31, 1975, 41 y.o.   MRN: [id number]    D: Pt has been very flat and depressed on the unit today, pt has also been very isolative. Pt reported this morning that he felt bad, he also refused all of his medications. Pt reported that he just wanted to be left alone to rest. Pt woke up and attended afternoon group, he reported that he felt much better. Pt reported being negative SI/HI, no AH/VH noted. A: 15 min checks continued for patient safety. R: Pt safety maintained.

## 2017-01-10 NOTE — Progress Notes (Signed)
Patient ID: Tony Cunningham, male   DOB: 1975-11-09, 41 y.o.   MRN: [id number]    Pts blood sugar at dinner time was 449, Takia NP was notified new orders noted. Pt was given a total of 23 units of insulin, he was also given his evening medication. Will recheck patients blood sugar at 530.

## 2017-01-10 NOTE — Progress Notes (Signed)
Patient ID: Tony Cunningham, male   DOB: Oct 16, 1975, 41 y.o.   MRN: [id number]   Pt blood sugar was 413 before dinner, Takia NP was made aware. New orders noted to recheck after dinner and to also get a UA. Jacquelyne Balint RN

## 2017-01-10 NOTE — Progress Notes (Signed)
[facility] MD Progress Note  01/10/2017 10:48 AM Tarry Fountain  MRN:  [id number]   Subjective: Patient states "I feel better. Me and ROb joked around yesterday. Im nervous but excited at the same time."  Objective: Patient seen and chart reviewed. Discussed patient with treatment team. Patient is observed in his room, yet his responses are appropriate as well as his mannerisms. He does endorse some anxiety about going to the new group home, yet he is ready to go. His blood sugar was elevated this morning however was corrected with insulin protocol. These are all positive changes since previously patient was noted as withdrawn, with very limited speech, poor eye contact and so on. Patient continues to need support. He has no tremors or akathisia. He is compliant with medications.   Principal Problem: Schizoaffective disorder, bipolar type (HCC)  Diagnosis:   Patient Active Problem List   Diagnosis Date Noted  . 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 Schizoaffective disorder, bipolar type (HCC) [F25.0] 12/22/2016  . Schizoaffective disorder, depressive type (HCC) [F25.1] 09/23/2016  . Polysubstance abuse [F19.10] 07/17/2016  . Tardive dyskinesia [G24.01] 07/16/2016  . Tobacco use disorder [F17.200] 07/07/2016  . Dyslipidemia [E78.5] 07/07/2016  . Asthma [J45.909] 07/07/2016  . HTN (hypertension) [I10] 07/06/2016  . Diabetes (HCC) [E11.9] 12/25/2010   Total Time spent with patient: 20 minutes  Past Psychiatric History: Please see H&P.   Past Medical History:  Past Medical History:  Diagnosis Date  . Anxiety   . Asthma   . Diabetes mellitus   . High blood pressure   . Sinus complaint    History reviewed. No pertinent surgical history. Family History: Please see H&P.  Family Psychiatric  History: Please see H&P.  Social History:  History  Alcohol Use No     History  Drug Use No    Social History   Social History  . Marital status: Single    Spouse name:  [name]  . Number of children: [name]  . Years of education: [name]   Social History Main Topics  . Smoking status: Current Every Day Smoker    Packs/day: 0.50    Types: Cigarettes  . Smokeless tobacco: Never Used  . Alcohol use No  . Drug use: No  . Sexual activity: Not Currently   Other Topics Concern  . None   Social History Narrative  . None   Additional Social History:   Sleep: Fair  Appetite:  Fair  Current Medications: Current Facility-Administered Medications  Medication Dose Route Frequency Provider Last Rate Last Dose  . alum & mag hydroxide-simeth (MAALOX/MYLANTA) 200-200-20 MG/5ML suspension 30 mL  30 mL Oral Q6H PRN Charm Rings, NP   30 mL at 01/08/17 2320  . amantadine (SYMMETREL) capsule 100 mg  100 mg Oral BID Eappen, Saramma, MD   100 mg at 01/09/17 1726  . benztropine (COGENTIN) tablet 1 mg  1 mg Oral BID Jomarie Longs, MD   1 mg at 01/09/17 1730  . cholecalciferol (VITAMIN D) tablet 400 Units  400 Units Oral Daily Jomarie Longs, MD   400 Units at 01/09/17 0808  . citalopram (CELEXA) tablet 20 mg  20 mg Oral Daily Armandina Stammer I, NP   20 mg at 01/09/17 0809  . cloZAPine (CLOZARIL) tablet 150 mg  150 mg Oral QHS Oneta Rack, NP   150 mg at 01/09/17 2119  . cyclobenzaprine (FLEXERIL) tablet 5 mg  5 mg Oral TID PRN Money, Gerlene Burdock, FNP   5 mg at  01/08/17 1607  . diclofenac sodium (VOLTAREN) 1 % transdermal gel 2 g  2 g Topical QID Eappen, Saramma, MD   2 g at 01/09/17 2119  . ibuprofen (ADVIL,MOTRIN) tablet 800 mg  800 mg Oral Q8H PRN Jomarie Longs, MD   800 mg at 01/09/17 1946  . insulin aspart (novoLOG) injection 0-15 Units  0-15 Units Subcutaneous TID WC Charm Rings, NP   3 Units at 01/10/17 [phone]  . insulin aspart (novoLOG) injection 0-5 Units  0-5 Units Subcutaneous QHS Charm Rings, NP   Stopped at 01/09/17 2042  . insulin aspart (novoLOG) injection 8 Units  8 Units Subcutaneous TID WC Jomarie Longs, MD   8 Units at 01/10/17 0635  . insulin  glargine (LANTUS) injection 45 Units  45 Units Subcutaneous QHS Armandina Stammer I, NP   Stopped at 01/09/17 2036  . lisinopril (PRINIVIL,ZESTRIL) tablet 20 mg  20 mg Oral Daily Charm Rings, NP   20 mg at 01/09/17 2330  . magnesium hydroxide (MILK OF MAGNESIA) suspension 30 mL  30 mL Oral Daily PRN Charm Rings, NP      . metFORMIN (GLUCOPHAGE) tablet 1,000 mg  1,000 mg Oral BID WC Charm Rings, NP   1,000 mg at 01/09/17 1730  . montelukast (SINGULAIR) tablet 10 mg  10 mg Oral QHS Charm Rings, NP   10 mg at 01/09/17 2119  . nicotine polacrilex (NICORETTE) gum 2 mg  2 mg Oral PRN Jomarie Longs, MD      . ondansetron (ZOFRAN) tablet 4 mg  4 mg Oral Q8H PRN Charm Rings, NP      . pantoprazole (PROTONIX) EC tablet 40 mg  40 mg Oral Daily Charm Rings, NP   40 mg at 01/09/17 0762  . polyethylene glycol (MIRALAX / GLYCOLAX) packet 17 g  17 g Oral Daily Charm Rings, NP   17 g at 01/06/17 0830  . propranolol (INDERAL) tablet 20 mg  20 mg Oral BID Charm Rings, NP   20 mg at 01/09/17 1728  . simvastatin (ZOCOR) tablet 20 mg  20 mg Oral q1800 Cobos, Rockey Situ, MD   20 mg at 01/09/17 1730  . temazepam (RESTORIL) capsule 7.5 mg  7.5 mg Oral QHS Eappen, Saramma, MD   7.5 mg at 01/09/17 2119   Lab Results:  Results for orders placed or performed during the hospital encounter of 12/22/16 (from the past 48 hour(s))  Glucose, capillary     Status: Abnormal   Collection Time: 01/08/17 10:58 AM  Result Value Ref Range   Glucose-Capillary 313 (H) 65 - 99 mg/dL  Glucose, capillary     Status: Abnormal   Collection Time: 01/08/17 12:15 PM  Result Value Ref Range   Glucose-Capillary 205 (H) 65 - 99 mg/dL  Glucose, capillary     Status: Abnormal   Collection Time: 01/08/17  4:56 PM  Result Value Ref Range   Glucose-Capillary 245 (H) 65 - 99 mg/dL  Glucose, capillary     Status: Abnormal   Collection Time: 01/08/17  8:27 PM  Result Value Ref Range   Glucose-Capillary 145 (H) 65 - 99  mg/dL  Glucose, capillary     Status: Abnormal   Collection Time: 01/09/17  6:34 AM  Result Value Ref Range   Glucose-Capillary 46 (L) 65 - 99 mg/dL  Glucose, capillary     Status: None   Collection Time: 01/09/17  6:46 AM  Result Value Ref Range  Glucose-Capillary 72 65 - 99 mg/dL  Glucose, capillary     Status: Abnormal   Collection Time: 01/09/17 12:01 PM  Result Value Ref Range   Glucose-Capillary 276 (H) 65 - 99 mg/dL  Glucose, capillary     Status: Abnormal   Collection Time: 01/09/17  4:41 PM  Result Value Ref Range   Glucose-Capillary 264 (H) 65 - 99 mg/dL   Comment 1 Notify RN    Comment 2 Document in Chart   Glucose, capillary     Status: None   Collection Time: 01/09/17  7:40 PM  Result Value Ref Range   Glucose-Capillary 87 65 - 99 mg/dL   Comment 1 Notify RN   Glucose, capillary     Status: Abnormal   Collection Time: 01/09/17  9:17 PM  Result Value Ref Range   Glucose-Capillary 181 (H) 65 - 99 mg/dL  Glucose, capillary     Status: Abnormal   Collection Time: 01/10/17  6:03 AM  Result Value Ref Range   Glucose-Capillary 179 (H) 65 - 99 mg/dL   Comment 1 Notify RN    Blood Alcohol level:  Lab Results  Component Value Date   ETH <5 12/21/2016   ETH <5 10/21/2016   Metabolic Disorder Labs: Lab Results  Component Value Date   HGBA1C 10.3 (H) 11/09/2016   MPG 249 11/09/2016   MPG 275 10/09/2016   Lab Results  Component Value Date   PROLACTIN 24.5 (H) 09/24/2016   PROLACTIN 3.4 (L) 07/07/2016   Lab Results  Component Value Date   CHOL 129 12/28/2016   TRIG 78 12/28/2016   HDL 54 12/28/2016   CHOLHDL 2.4 12/28/2016   VLDL 16 12/28/2016   LDLCALC 59 12/28/2016   LDLCALC 42 11/09/2016   Physical Findings: AIMS: Facial and Oral Movements Muscles of Facial Expression: None, normal Lips and Perioral Area: None, normal Jaw: None, normal Tongue: None, normal,Extremity Movements Upper (arms, wrists, hands, fingers): Mild Lower (legs, knees, ankles,  toes): None, normal, Trunk Movements Neck, shoulders, hips: None, normal, Overall Severity Severity of abnormal movements (highest score from questions above): Mild Incapacitation due to abnormal movements: Minimal Patient's awareness of abnormal movements (rate only patient's report): Aware, no distress, Dental Status Current problems with teeth and/or dentures?: No Does patient usually wear dentures?: No  CIWA:  CIWA-Ar Total: 4 COWS:  COWS Total Score: 4  Musculoskeletal: Strength & Muscle Tone: within normal limits Gait & Station: normal Patient leans: [name]  Psychiatric Specialty Exam: Physical Exam  Nursing note and vitals reviewed.   Review of Systems  Musculoskeletal: Positive for neck pain.  Psychiatric/Behavioral: Positive for depression and hallucinations. The patient is nervous/anxious.   All other systems reviewed and are negative.   Blood pressure 111/83, pulse 94, temperature 98.7 F (37.1 C), temperature source Oral, resp. rate 18, height 6' (1.829 m), weight 81.4 kg (179 lb 8 oz), SpO2 100 %.Body mass index is 24.34 kg/m.  General Appearance: Guarded  Eye Contact:  Fair  Speech:  Normal Rate  Volume:  Decreased  Mood:  Depressed  Affect:  Congruent  Thought Process:  Linear and Descriptions of Associations: Circumstantial  Orientation:  Full (Time, Place, and Person)  Thought Content:  Paranoid Ideation, AH improving  Suicidal Thoughts:  No  Homicidal Thoughts:  No  Memory:  Immediate;   Fair Recent;   Fair Remote;   Fair  Judgement:  Fair  Insight:  Fair  Psychomotor Activity:  Tremor  Concentration:  Concentration: Fair and Attention Span:  Fair  Recall:  [employer] of Knowledge:  Fair  Language:  Fair  Akathisia:  No  Handed:  Right  AIMS (if indicated):      Assets:  Desire for Improvement Resilience Social Support  ADL's:  Intact  Cognition:  WNL  Sleep:  Number of Hours: 5   Treatment Plan Summary: Patient with schizoaffective do , hx of  past ECT treatments , currently making progress, his parkinsonian sx , likely neuroleptic induced continues to be the same , is not worsening, currently on medications to help with the same. Patient with improved sleep as well as affect, his negative sx are improving.  Continue current treatment Plan: Will continue today 01/10/17  plan as below except where it is noted. Daily contact with patient to assess and evaluate symptoms and progress in treatment and Medication management   -Continue treatment plan and medication management as below:  -Continue Clozaril 150 mg po qhs for psychosis. Clozaril level - 598 , CBC with diff - wbc - 7.1 , ANC- 3.0 , Repeat CBC with diff every 2 weeks .  -Continue Restoril 7.5 mg po qhs for sleep.  -Continue Amantadine 100 mg po bid for neuroleptic induced tremors.  -Continue Cogentin 1 mg po bid for EPS.  -Continue Celexa 20 mg po daily for affective sx.  -Continue Propranolol 20 mg po bid for tremors, anxiety.  -Continue Flexeril 5 mg po tid prn for muscle spasms , motrin 800 mg po q8h prn for pain.  -Continue Novolog Insulin as per diabetic consult recommendations.  -Vitamin D replacement for low Vitamin D level.  Continue to monitor vitals ,medication compliance and treatment side effects.   Will monitor for medical issues as well as call consult as needed.  Reviewed labs tsh -wnl, vitamin b12 - wnl.  CSW will continue working on disposition. Referral  to [facility] in progress.[facility] staff to visit patient today. Patient encourage to participate in therapeutic milieu .   Truman Hayward, FNP, 01/08/2017, 10:48 AM  Agree with NP Progress Note

## 2017-01-11 LAB — GLUCOSE, CAPILLARY
GLUCOSE-CAPILLARY: 169 mg/dL — AB (ref 65–99)
GLUCOSE-CAPILLARY: 176 mg/dL — AB (ref 65–99)
Glucose-Capillary: 212 mg/dL — ABNORMAL HIGH (ref 65–99)
Glucose-Capillary: 120 mg/dL — ABNORMAL HIGH (ref 65–99)

## 2017-01-11 MED ORDER — TUBERCULIN PPD 5 UNIT/0.1ML ID SOLN
5.0000 [IU] | Freq: Once | INTRADERMAL | Status: AC
  Administered 2017-01-11: 5 [IU] via INTRADERMAL

## 2017-01-11 NOTE — Progress Notes (Signed)
Recreation Therapy Notes  Date: 01/11/17 Time: 1000 Location: 500 Hall Dayroom  Group Topic: Coping Skills  Goal Area(s) Addresses:  Patient will be able to identify the benefits of positive coping skills. Patient will be able to identify the of using coping skills post d/c.  Behavioral Response: Minimal  Intervention: Mindmap worksheets, pencils  Activity: Mindmap.  Patients were given a worksheet of a blank mindmap.  Patients and LRT filled in the first eight boxes together with situations/circumstances that would cause you use coping skills.  Patients identified the eight areas as relationships, depression, anxiety, family, career, arguments, peer pressure and force.   Education: Pharmacologist, Building control surveyor.   Education Outcome: Acknowledges understanding/In group clarification offered/Needs additional education.   Clinical Observations/Feedback: Pt arrived late to group.  LRT caught pt up on the activity.  Pt stated using coping skills helps you relax.  Pt identified deep breathing as a coping skill.   [name], LRT/CTRS         [name] A 01/11/2017 11:48 AM

## 2017-01-11 NOTE — Social Work (Signed)
Achilles Dunk (owner of [facility] and 3 other facilities) states he will review patient FL2 for possible admission this week.  States patient will need "two step Tb skin test" meaning that patient will need prior Tb test repeated/read again prior to admission.  Documents sent via [facility] for review.  Lonell Face will visit/interview patient tomorrow.  Santa Genera, LCSW Lead Clinical Social Worker Phone:  [phone]

## 2017-01-11 NOTE — Progress Notes (Signed)
Patient ID: Tony Cunningham, male   DOB: 1975-07-24, 41 y.o.   MRN: [id number] PER STATE REGULATIONS 482.30  THIS CHART WAS REVIEWED FOR MEDICAL NECESSITY WITH RESPECT TO THE PATIENT'S ADMISSION/ DURATION OF STAY.  NEXT REVIEW DATE: 01/15/2017  Willa Rough, RN, BSN CASE MANAGER

## 2017-01-11 NOTE — Progress Notes (Addendum)
[facility] MD Progress Note  01/11/2017 12:02 PM Tony Cunningham  MRN:  [id number]   Subjective:Patient states " I am fine.'    Objective:Patient seen and chart reviewed.Discussed patient with treatment team.  Pt seen in bed today , minimal response when questions asked. Nods his head "yes or no" to most questions. Smiles at times appropriately. Pt reports he still feels anxious and sad at times But overall making progress. Has been seen as more visible in milieu.  Pt's tremors, reduced , cog wheeling rigidity noted as minimal on right side , + on left side. Pt denies any distress.  Received message from Ms.Edwyna Shell LCSW , that the [facility] that he were referred to are requesting two step TB test. Discussed this with pharmacist. Could give Tb test to alternate arm within 7 - 21 days of the first. If test positive , will get chest xray to R/O active disease.     Principal Problem: Schizoaffective disorder, bipolar type ([place])  Diagnosis:   Patient Active Problem List   Diagnosis Date Noted  . 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 Schizoaffective disorder, bipolar type ([place]) [F25.0] 12/22/2016  . Schizoaffective disorder, depressive type ([place]) [F25.1] 09/23/2016  . Polysubstance abuse [F19.10] 07/17/2016  . Tardive dyskinesia [G24.01] 07/16/2016  . Tobacco use disorder [F17.200] 07/07/2016  . Dyslipidemia [E78.5] 07/07/2016  . Asthma [J45.909] 07/07/2016  . HTN (hypertension) [I10] 07/06/2016  . Diabetes ([place]) [E11.9] 12/25/2010   Total Time spent with patient: 20 minutes  Past Psychiatric History: Please see H&P.   Past Medical History:  Past Medical History:  Diagnosis Date  . Anxiety   . Asthma   . Diabetes mellitus   . High blood pressure   . Sinus complaint    History reviewed. No pertinent surgical history. Family History: Please see H&P.  Family Psychiatric  History: Please see H&P.  Social History:  History  Alcohol Use No     History  Drug  Use No    Social History   Social History  . Marital status: Single    Spouse name: [name]  . Number of children: [name]  . Years of education: [name]   Social History Main Topics  . Smoking status: Current Every Day Smoker    Packs/day: 0.50    Types: Cigarettes  . Smokeless tobacco: Never Used  . Alcohol use No  . Drug use: No  . Sexual activity: Not Currently   Other Topics Concern  . None   Social History Narrative  . None   Additional Social History:   Sleep: Fair  Appetite:  Fair  Current Medications: Current Facility-Administered Medications  Medication Dose Route Frequency Provider Last Rate Last Dose  . alum & mag hydroxide-simeth (MAALOX/MYLANTA) 200-200-20 MG/5ML suspension 30 mL  30 mL Oral Q6H PRN Patrecia Pour, NP   30 mL at 01/08/17 2320  . amantadine (SYMMETREL) capsule 100 mg  100 mg Oral BID Ursula Alert, MD   100 mg at 01/11/17 0820  . benztropine (COGENTIN) tablet 1 mg  1 mg Oral BID Ursula Alert, MD   1 mg at 01/11/17 0820  . cholecalciferol (VITAMIN D) tablet 400 Units  400 Units Oral Daily Ursula Alert, MD   400 Units at 01/11/17 0820  . citalopram (CELEXA) tablet 20 mg  20 mg Oral Daily Lindell Spar I, NP   20 mg at 01/11/17 0820  . cloZAPine (CLOZARIL) tablet 150 mg  150 mg Oral QHS Derrill Center, NP   150  mg at 01/10/17 2131  . cyclobenzaprine (FLEXERIL) tablet 5 mg  5 mg Oral TID PRN Money, Lowry Ram, FNP   5 mg at 01/08/17 1607  . diclofenac sodium (VOLTAREN) 1 % transdermal gel 2 g  2 g Topical QID Ursula Alert, MD   2 g at 01/10/17 2221  . ibuprofen (ADVIL,MOTRIN) tablet 800 mg  800 mg Oral Q8H PRN Ursula Alert, MD   800 mg at 01/09/17 1946  . insulin aspart (novoLOG) injection 0-15 Units  0-15 Units Subcutaneous TID WC Patrecia Pour, NP   3 Units at 01/11/17 [phone]  . insulin aspart (novoLOG) injection 0-5 Units  0-5 Units Subcutaneous QHS Patrecia Pour, NP   3 Units at 01/10/17 2220  . insulin aspart (novoLOG) injection 8 Units   8 Units Subcutaneous TID WC Ursula Alert, MD   8 Units at 01/11/17 [phone]  . insulin glargine (LANTUS) injection 45 Units  45 Units Subcutaneous QHS Lindell Spar I, NP   45 Units at 01/10/17 2048  . lisinopril (PRINIVIL,ZESTRIL) tablet 20 mg  20 mg Oral Daily Patrecia Pour, NP   20 mg at 01/11/17 5409  . magnesium hydroxide (MILK OF MAGNESIA) suspension 30 mL  30 mL Oral Daily PRN Patrecia Pour, NP      . metFORMIN (GLUCOPHAGE) tablet 1,000 mg  1,000 mg Oral BID WC Patrecia Pour, NP   1,000 mg at 01/11/17 0820  . montelukast (SINGULAIR) tablet 10 mg  10 mg Oral QHS Patrecia Pour, NP   10 mg at 01/10/17 2131  . nicotine polacrilex (NICORETTE) gum 2 mg  2 mg Oral PRN Kaity Pitstick, Ria Clock, MD      . ondansetron (ZOFRAN) tablet 4 mg  4 mg Oral Q8H PRN Patrecia Pour, NP      . pantoprazole (PROTONIX) EC tablet 40 mg  40 mg Oral Daily Patrecia Pour, NP   40 mg at 01/11/17 8119  . polyethylene glycol (MIRALAX / GLYCOLAX) packet 17 g  17 g Oral Daily Patrecia Pour, NP   17 g at 01/06/17 0830  . propranolol (INDERAL) tablet 20 mg  20 mg Oral BID Patrecia Pour, NP   20 mg at 01/11/17 1478  . simvastatin (ZOCOR) tablet 20 mg  20 mg Oral q1800 Cobos, Myer Peer, MD   20 mg at 01/10/17 1653  . temazepam (RESTORIL) capsule 7.5 mg  7.5 mg Oral QHS Kween Bacorn, MD   7.5 mg at 01/10/17 2131  . tuberculin injection 5 Units  5 Units Intradermal Once Ursula Alert, MD       Lab Results:  Results for orders placed or performed during the hospital encounter of 12/22/16 (from the past 48 hour(s))  Glucose, capillary     Status: Abnormal   Collection Time: 01/09/17  4:41 PM  Result Value Ref Range   Glucose-Capillary 264 (H) 65 - 99 mg/dL   Comment 1 Notify RN    Comment 2 Document in Chart   Glucose, capillary     Status: None   Collection Time: 01/09/17  7:40 PM  Result Value Ref Range   Glucose-Capillary 87 65 - 99 mg/dL   Comment 1 Notify RN   Glucose, capillary     Status: Abnormal    Collection Time: 01/09/17  9:17 PM  Result Value Ref Range   Glucose-Capillary 181 (H) 65 - 99 mg/dL  Glucose, capillary     Status: Abnormal   Collection Time: 01/10/17  6:03 AM  Result Value Ref Range   Glucose-Capillary 179 (H) 65 - 99 mg/dL   Comment 1 Notify RN   Glucose, capillary     Status: None   Collection Time: 01/10/17 11:47 AM  Result Value Ref Range   Glucose-Capillary 92 65 - 99 mg/dL  Glucose, capillary     Status: Abnormal   Collection Time: 01/10/17  4:46 PM  Result Value Ref Range   Glucose-Capillary 449 (H) 65 - 99 mg/dL  Glucose, capillary     Status: Abnormal   Collection Time: 01/10/17  5:27 PM  Result Value Ref Range   Glucose-Capillary 413 (H) 65 - 99 mg/dL  Urinalysis, dipstick only     Status: Abnormal   Collection Time: 01/10/17  6:00 PM  Result Value Ref Range   Color, Urine YELLOW (A) YELLOW   APPearance YELLOW (A) CLEAR   Specific Gravity, Urine 1.015 1.005 - 1.030   pH 7.0 5.0 - 8.0   Glucose, UA NEGATIVE NEGATIVE mg/dL   Hgb urine dipstick NEGATIVE NEGATIVE   Bilirubin Urine NEGATIVE NEGATIVE   Ketones, ur NEGATIVE NEGATIVE mg/dL   Protein, ur NEGATIVE NEGATIVE mg/dL   Nitrite NEGATIVE NEGATIVE   Leukocytes, UA NEGATIVE NEGATIVE   RBC / HPF 0-5 0 - 5 RBC/hpf   WBC, UA 0-5 0 - 5 WBC/hpf   Bacteria, UA NONE SEEN NONE SEEN   Squamous Epithelial / LPF 0-5 (A) NONE SEEN    Comment: Performed at [facility], [place] [address] [place], [place] [address]  Glucose, capillary     Status: Abnormal   Collection Time: 01/10/17  6:23 PM  Result Value Ref Range   Glucose-Capillary 354 (H) 65 - 99 mg/dL  CBC with Differential/Platelet     Status: None   Collection Time: 01/10/17  6:35 PM  Result Value Ref Range   WBC 6.7 4.0 - 10.5 K/uL   RBC 4.36 4.22 - 5.81 MIL/uL   Hemoglobin 13.6 13.0 - 17.0 g/dL   HCT 39.2 39.0 - 52.0 %   MCV 89.9 78.0 - 100.0 fL   MCH 31.2 26.0 - 34.0 pg   MCHC 34.7 30.0 - 36.0 g/dL   RDW 12.9 11.5 - 15.5 %    Platelets 176 150 - 400 K/uL   Neutrophils Relative % 47 %   Neutro Abs 3.2 1.7 - 7.7 K/uL   Lymphocytes Relative 40 %   Lymphs Abs 2.7 0.7 - 4.0 K/uL   Monocytes Relative 8 %   Monocytes Absolute 0.5 0.1 - 1.0 K/uL   Eosinophils Relative 4 %   Eosinophils Absolute 0.3 0.0 - 0.7 K/uL   Basophils Relative 1 %   Basophils Absolute 0.1 0.0 - 0.1 K/uL    Comment: Performed at [facility], [place] [address], [place], [place] [address]  Basic metabolic panel     Status: Abnormal   Collection Time: 01/10/17  6:35 PM  Result Value Ref Range   Sodium 134 (L) 135 - 145 mmol/L   Potassium 4.4 3.5 - 5.1 mmol/L   Chloride 96 (L) 101 - 111 mmol/L   CO2 31 22 - 32 mmol/L   Glucose, Bld 381 (H) 65 - 99 mg/dL   BUN 10 6 - 20 mg/dL   Creatinine, Ser 1.03 0.61 - 1.24 mg/dL   Calcium 8.9 8.9 - 10.3 mg/dL   GFR calc non Af Amer >60 >60 mL/min   GFR calc Af Amer >60 >60 mL/min    Comment: (NOTE) The eGFR  has been calculated using the CKD EPI equation. This calculation has not been validated in all clinical situations. eGFR's persistently <60 mL/min signify possible Chronic Kidney Disease.    Anion gap 7 5 - 15    Comment: Performed at [facility], [place] [address], [place], [name] [address]  Glucose, capillary     Status: Abnormal   Collection Time: 01/10/17  7:58 PM  Result Value Ref Range   Glucose-Capillary 339 (H) 65 - 99 mg/dL  Glucose, capillary     Status: Abnormal   Collection Time: 01/10/17  9:59 PM  Result Value Ref Range   Glucose-Capillary 300 (H) 65 - 99 mg/dL  Glucose, capillary     Status: Abnormal   Collection Time: 01/11/17  6:10 AM  Result Value Ref Range   Glucose-Capillary 169 (H) 65 - 99 mg/dL   Blood Alcohol level:  Lab Results  Component Value Date   ETH <5 12/21/2016   ETH <5 57/05/7791   Metabolic Disorder Labs: Lab Results  Component Value Date   HGBA1C 10.3 (H) 11/09/2016   MPG 249 11/09/2016   MPG 275 10/09/2016    Lab Results  Component Value Date   PROLACTIN 24.5 (H) 09/24/2016   PROLACTIN 3.4 (L) 07/07/2016   Lab Results  Component Value Date   CHOL 129 12/28/2016   TRIG 78 12/28/2016   HDL 54 12/28/2016   CHOLHDL 2.4 12/28/2016   VLDL 16 12/28/2016   LDLCALC 59 12/28/2016   LDLCALC 42 11/09/2016   Physical Findings: AIMS: Facial and Oral Movements Muscles of Facial Expression: None, normal Lips and Perioral Area: None, normal Jaw: None, normal Tongue: None, normal,Extremity Movements Upper (arms, wrists, hands, fingers): Minimal Lower (legs, knees, ankles, toes): None, normal, Trunk Movements Neck, shoulders, hips: None, normal, Overall Severity Severity of abnormal movements (highest score from questions above): Minimal Incapacitation due to abnormal movements: None, normal Patient's awareness of abnormal movements (rate only patient's report): No Awareness, Dental Status Current problems with teeth and/or dentures?: No Does patient usually wear dentures?: No  CIWA:  CIWA-Ar Total: 4 COWS:  COWS Total Score: 4  Musculoskeletal: Strength & Muscle Tone: within normal limits Gait & Station: normal Patient leans: [name]  Psychiatric Specialty Exam: Physical Exam  Nursing note and vitals reviewed.   Review of Systems  Psychiatric/Behavioral: The patient is nervous/anxious.   All other systems reviewed and are negative.   Blood pressure 107/78, pulse 83, temperature 98.3 F (36.8 C), temperature source Oral, resp. rate 20, height 6' (1.829 m), weight 81.4 kg (179 lb 8 oz), SpO2 100 %.Body mass index is 24.34 kg/m.  General Appearance: Fairly Groomed  Eye Contact:  Fair  Speech:  Normal Rate  Volume:  Decreased  Mood:  Dysphoric  Affect:  Congruent  Thought Process:  Linear and Descriptions of Associations: Circumstantial  Orientation:  Full (Time, Place, and Person)  Thought Content:  Paranoid Ideation, improving, AH is improving  Suicidal Thoughts:  No  Homicidal  Thoughts:  No  Memory:  Immediate;   Fair Recent;   Fair Remote;   Fair  Judgement:  Fair  Insight:  Fair  Psychomotor Activity:  tremors very minimal BL , cogwheeling rigidity more on lright side , diminished on left compared to past evaluation- improving   Concentration:  Concentration: Fair and Attention Span: Fair  Recall:  [employer] of Knowledge:  Fair  Language:  Fair  Akathisia:  No  Handed:  Right  AIMS (if indicated):  2  Assets:  Desire for Improvement Resilience Social Support  ADL's:  Intact  Cognition:  WNL  Sleep:  Number of Hours: 4.25   Treatment Plan Summary: Patient with schizoaffective do , hx of past ECT treatments , currently making progress , he is more visible in milieu , is compliant on medications , his neuroleptic induced parkinsonian sx are improving . He does have borderline low Na+ level , will repeat , reassess . [place] to assess patient today for possible placement. Will offer PPD - two step today - second test, first negative ( 12/25/2016)  Will continue today 01/11/17 plan as below except where it is noted.  Daily contact with patient to assess and evaluate symptoms and progress in treatment, Medication management and Plan see below   -Continue treatment plan and medication management as below:  -Continue Clozaril 150 mg po qhs for psychosis.   -Continue Restoril 7.5 mg po qhs for sleep.  -Continue Amantadine 100 mg po bid for neuroleptic induced tremors.  -Continue Cogentin 1 mg po bid for EPS.  -Continue Celexa 20 mg po daily for affective sx.  -Continue Propranolol 20 mg po bid for tremors, anxiety.  -Continue Flexeril 5 mg po tid prn for muscle spasms , motrin 800 mg po q8h prn for pain.  -Continue Diabetic management as per Diabetic consult recommendations- pls see chart.  -Vitamin D replacement for low Vitamin D level.  Continue to monitor vitals ,medication compliance and treatment side effects.   Will monitor for medical issues  as well as call consult as needed.  Reviewed labs tsh -wnl, vitamin b12 - wnl.  -Repeat BMP - Na+ noted as 134 .  -CSW will continue to work on disposition. [facility] referral in progress. Will offer PPD again today as part of two step Tb test.  Aralyn Nowak, MD, 01/11/2017, 12:02 PM

## 2017-01-11 NOTE — [facility] Group Notes (Signed)
[facility] LCSW Group Therapy  01/11/2017 , 3:57 PM   Type of Therapy:  Group Therapy  Participation Level:  Active  Participation Quality:  Attentive  Affect:  Appropriate  Cognitive:  Alert  Insight:  Improving  Engagement in Therapy:  Engaged  Modes of Intervention:  Discussion, Exploration and Socialization  Summary of Progress/Problems: Today's group focused on the term Diagnosis.  Participants were asked to define the term, and then pronounce whether it is a negative, positive or neutral term. Stayed the entire time, engaged throughout.  "I would like to live somewhere where there are other people who like to play video games.  It needs to be an ALF."  Talked about his parents.  "It doesn't work for me to live with my mother.  We argue a lot.  My father lives in [place].  I have not seen him for years."  Ida Rogue 01/11/2017 , 3:57 PM

## 2017-01-11 NOTE — Progress Notes (Signed)
Patient ID: Tony Cunningham, male   DOB: 1975-08-04, 41 y.o.   MRN: [id number] .PER STATE REGULATIONS 482.30  THIS CHART WAS REVIEWED FOR MEDICAL NECESSITY WITH RESPECT TO THE PATIENT'S ADMISSION/ DURATION OF STAY.  NEXT REVIEW DATE: 01/11/2017  Willa Rough, RN, BSN CASE MANAGER

## 2017-01-11 NOTE — Progress Notes (Signed)
DAR NOTE: Patient presents with anxious affect and depressed mood.  Denies pain, auditory and visual hallucinations.  Reports symptoms of tremors, cramping and irritability on self inventory form.  Rates depression at 7, hopelessness at 3, and anxiety at 8.  Maintained on routine safety checks.  Medications given as prescribed.  Support and encouragement offered as needed.  Attended group and participated. Patient visible in the dayroom for group and activity.  Patient reports being anxious about discharge plan. PPD placed on left forearm.

## 2017-01-11 NOTE — Progress Notes (Signed)
Adult Psychoeducational Group Note  Date:  01/11/2017 Time:  8:50 PM  Group Topic/Focus:  Wrap-Up Group:   The focus of this group is to help patients review their daily goal of treatment and discuss progress on daily workbooks.  Participation Level:  Active  Participation Quality:  Appropriate  Affect:  Appropriate  Cognitive:  Appropriate  Insight: Appropriate  Engagement in Group:  Engaged  Modes of Intervention:  Discussion  Additional Comments: The patient expressed that he attend groups.  Octavio Manns 01/11/2017, 8:50 PM

## 2017-01-12 LAB — GLUCOSE, CAPILLARY
GLUCOSE-CAPILLARY: 157 mg/dL — AB (ref 65–99)
GLUCOSE-CAPILLARY: 84 mg/dL (ref 65–99)
Glucose-Capillary: 70 mg/dL (ref 65–99)
Glucose-Capillary: 139 mg/dL — ABNORMAL HIGH (ref 65–99)
Glucose-Capillary: 59 mg/dL — ABNORMAL LOW (ref 65–99)
Glucose-Capillary: 201 mg/dL — ABNORMAL HIGH (ref 65–99)

## 2017-01-12 LAB — BASIC METABOLIC PANEL
Anion gap: 7 (ref 5–15)
BUN: 11 mg/dL (ref 6–20)
CALCIUM: 9 mg/dL (ref 8.9–10.3)
CHLORIDE: 102 mmol/L (ref 101–111)
CO2: 31 mmol/L (ref 22–32)
CREATININE: 0.62 mg/dL (ref 0.61–1.24)
GFR calc non Af Amer: >60 mL/min (ref >60)
Glucose, Bld: 67 mg/dL (ref 65–99)
Potassium: 3.6 mmol/L (ref 3.5–5.1)
SODIUM: 140 mmol/L (ref 135–145)

## 2017-01-12 MED ORDER — HYDROXYZINE HCL 25 MG PO TABS
25.0000 mg | ORAL_TABLET | Freq: Every day | ORAL | Status: DC
  Administered 2017-01-12 – 2017-01-14 (×3): 25 mg via ORAL
  Filled 2017-01-12 (×5): qty 1

## 2017-01-12 MED ORDER — INSULIN ASPART 100 UNIT/ML ~~LOC~~ SOLN
6.0000 [IU] | Freq: Three times a day (TID) | SUBCUTANEOUS | Status: DC
  Administered 2017-01-12 – 2017-01-15 (×8): 6 [IU] via SUBCUTANEOUS

## 2017-01-12 MED ORDER — INSULIN GLARGINE 100 UNIT/ML ~~LOC~~ SOLN
40.0000 [IU] | Freq: Every day | SUBCUTANEOUS | Status: DC
  Administered 2017-01-13 – 2017-01-14 (×2): 40 [IU] via SUBCUTANEOUS

## 2017-01-12 NOTE — Progress Notes (Signed)
Recreation Therapy Notes  Animal-Assisted Activity (AAA) Program Checklist/Progress Notes Patient Eligibility Criteria Checklist & Daily Group note for Rec Tx Intervention  Date: 08.21.2018 Time: 2:45pm Location: [address]    AAA/T Program Assumption of Risk Form signed by Patient/ or Parent Legal Guardian Yes  Patient is free of allergies or sever asthma Yes  Patient reports no fear of animals Yes  Patient reports no history of cruelty to animals Yes  Patient understands his/her participation is voluntary Yes  Behavioral Response: Did not attend.   Clinical Observations/Feedback: Patient offered opportunity to attend session and expressed interest in attending, however patient did not attend group session.   Marykay Lex Kalynn Declercq, LRT/CTRS        Jearl Klinefelter 01/12/2017 3:18 PM

## 2017-01-12 NOTE — Progress Notes (Signed)
  DATA ACTION RESPONSE  Objective- Pt. is visible in the dayroom, seen watching TV.Presents with a depressed/anxious/tearful    affect and mood. C/o of ongoing anxiety.BS 84 at bedtime. Provider on call notified.Insulin/Lantus held per orders. Pt advise to take snack back to room. Pt appears preoccupied with taking vistaril.    Subjective- Denies having any SI/HI/AVH at this time. Rates [place] 8/10; Head/Neck. Pt. states "I'm just feeling really anxious; Is it time for vistaril?". Is cooperative and remain safe on the unit.  1:1 interaction in private to establish rapport. Encouragement, education, & support given from staff.  PRN Ibuprofen and Flexeril requested and will re-eval accordingly.   Safety maintained with Q 15 checks. Continue with POC.

## 2017-01-12 NOTE — Progress Notes (Signed)
DAR NOTE: Patient presents with anxious affect and depressed mood.  Denies pain, auditory and visual hallucinations.  Patient stayed in his room most of this shift.  Rates depression at 7, hopelessness at 7, and anxiety at 7.  Low blood sugar result of 59 reported MD.  Diabetic consult ordered.  Snacks given per protocol.  Blood sugar reassessed, 139 obtained.  Maintained on routine safety checks.  Medications given as prescribed.  Support and encouragement offered as needed.  Patient safe on the unit.

## 2017-01-12 NOTE — Progress Notes (Signed)
Patient ID: Tony Cunningham, male   DOB: [birth year], 41 y.o.   MRN: [id number] D: Patient observed watching TV and interacting well with peers on approach. Pt has been asking when he will discharge and started saying "I think I will be here forever". Pt then reported of increase anxiety. Pt able to use deep breathing and pacing hallways to calm self down. Denies  SI/HI/AVH and pain.No behavioral issues noted.  A: Support and encouragement offered as needed. Medications administered as prescribed.  R: Patient is safe and cooperative on unit. Will continue to monitor  for safety and stability.

## 2017-01-12 NOTE — [facility] Group Notes (Signed)
[facility] LCSW Group Therapy  01/12/2017 1:15 pm  Type of Therapy: Process Group Therapy  Participation Level:  Active  Participation Quality:  Appropriate  Affect:  Flat  Cognitive:  Oriented  Insight:  Improving  Engagement in Group:  Limited  Engagement in Therapy:  Limited  Modes of Intervention:  Activity, Clarification, Education, Problem-solving and Support  Summary of Progress/Problems: Today's group addressed the issue of overcoming obstacles.  Patients were asked to identify their biggest obstacle post d/c that stands in the way of their on-going success, and then problem solve as to how to manage this. Invited.  Declined to attend.  Ida Rogue 01/12/2017   3:41 PM

## 2017-01-12 NOTE — Progress Notes (Signed)
Inpatient Diabetes Program Recommendations  AACE/ADA: New Consensus Statement on Inpatient Glycemic Control (2015)  Target Ranges:  Prepandial:   less than 140 mg/dL      Peak postprandial:   less than 180 mg/dL (1-2 hours)      Critically ill patients:  140 - 180 mg/dL   Lab Results  Component Value Date   GLUCAP 157 (H) 01/12/2017   HGBA1C 10.3 (H) 11/09/2016    Review of Glycemic Control  Diabetes history: DM2 Outpatient Diabetes medications: Lantus 55 units QHS, metformin 1000 mg bid, Novolog 10 units tidwc Current orders for Inpatient glycemic control: Lantus 45 units QHS, Novolog 8 units tidwc + 0-15 units tidwc and hs  HgbA1C -10.3% - indicates poor glycemic control at home. Hypoglycemia 67, 59 mg/dL this am.  Inpatient Diabetes Program Recommendations:    Decrease Lantus 40 units QHS Decrease meal coverage insulin to 6 units tidwc.  Will follow.  Thank you. Ailene Ards, RD, LDN, CDE Inpatient Diabetes Coordinator [phone]

## 2017-01-12 NOTE — [place] FL2 (Signed)
[place] MEDICAID FL2 LEVEL OF CARE SCREENING TOOL     IDENTIFICATION  Patient Name: Tony Cunningham Birthdate: December 02, 1975 Sex: male Admission Date (Current Location): 12/22/2016  [place] and [place] Number:  Tony Cunningham [id number] Tony Cunningham MCD; Medicare [id number] [id number] Facility and Address:   [facility]  [address]  [place] [address])      Provider Number: [id number]  Attending Physician Name and Address:  Jomarie Longs, MD  Relative Name and Phone Number:  [name], mother.  [phone]    Current Level of Care: Hospital Recommended Level of Care: [facility], Assisted Living Facility Prior Approval Number:    Date Approved/Denied:   PASRR Number: [id number]  Discharge Plan: Domiciliary (Rest home)    Current Diagnoses: Patient Active Problem List   Diagnosis Date Noted  . 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 Schizoaffective disorder, bipolar type (HCC) 12/22/2016  . Schizoaffective disorder, depressive type (HCC) 09/23/2016  . Polysubstance abuse 07/17/2016  . Tardive dyskinesia 07/16/2016  . Tobacco use disorder 07/07/2016  . Dyslipidemia 07/07/2016  . Asthma 07/07/2016  . HTN (hypertension) 07/06/2016  . Diabetes (HCC) 12/25/2010    Orientation RESPIRATION BLADDER Height & Weight     Self, Time, Situation, Place  Normal Continent Weight: 179 lb 8 oz (81.4 kg) Height:  6' (182.9 cm)  BEHAVIORAL SYMPTOMS/MOOD NEUROLOGICAL BOWEL NUTRITION STATUS     (None) Continent Diet (Diabetic diet)  AMBULATORY STATUS COMMUNICATION OF NEEDS Skin   Independent Verbally Normal                       Personal Care Assistance Level of Assistance  Bathing, Feeding, Dressing Bathing Assistance: Limited assistance Feeding assistance: Independent Dressing Assistance: Limited assistance     Functional Limitations Info  Sight, Hearing, Speech Sight Info: Adequate Hearing Info: Adequate Speech Info: Adequate     SPECIAL CARE FACTORS FREQUENCY  Diabetic urine testing Blood Pressure Frequency: has hypertension Diabetic Urine Testing Frequency: SEE MAR; sliding scale insulin required                Contractures Contractures Info: Not present    Additional Factors Info  Allergies   Allergies Info: no known allergies           Current Medications (01/12/2017):  This is the current hospital active medication list Current Facility-Administered Medications  Medication Dose Route Frequency Provider Last Rate Last Dose  . alum & mag hydroxide-simeth (MAALOX/MYLANTA) 200-200-20 MG/5ML suspension 30 mL  30 mL Oral Q6H PRN Charm Rings, NP   30 mL at 01/08/17 2320  . amantadine (SYMMETREL) capsule 100 mg  100 mg Oral BID Jomarie Longs, MD   100 mg at 01/12/17 0906  . benztropine (COGENTIN) tablet 1 mg  1 mg Oral BID Jomarie Longs, MD   1 mg at 01/12/17 0906  . cholecalciferol (VITAMIN D) tablet 400 Units  400 Units Oral Daily Jomarie Longs, MD   400 Units at 01/11/17 0820  . citalopram (CELEXA) tablet 20 mg  20 mg Oral Daily Armandina Stammer I, NP   20 mg at 01/12/17 0906  . cloZAPine (CLOZARIL) tablet 150 mg  150 mg Oral QHS Oneta Rack, NP   150 mg at 01/11/17 2154  . cyclobenzaprine (FLEXERIL) tablet 5 mg  5 mg Oral TID PRN Money, Gerlene Burdock, FNP   5 mg at 01/08/17 1607  . diclofenac sodium (VOLTAREN) 1 % transdermal gel 2 g  2 g Topical QID Jomarie Longs, MD  2 g at 01/11/17 2155  . ibuprofen (ADVIL,MOTRIN) tablet 800 mg  800 mg Oral Q8H PRN Jomarie Longs, MD   800 mg at 01/11/17 1658  . insulin aspart (novoLOG) injection 0-15 Units  0-15 Units Subcutaneous TID WC Charm Rings, NP   5 Units at 01/11/17 1658  . insulin aspart (novoLOG) injection 0-5 Units  0-5 Units Subcutaneous QHS Charm Rings, NP   3 Units at 01/10/17 2220  . insulin aspart (novoLOG) injection 8 Units  8 Units Subcutaneous TID WC Jomarie Longs, MD   8 Units at 01/11/17 1700  . insulin glargine (LANTUS)  injection 45 Units  45 Units Subcutaneous QHS Armandina Stammer I, NP   45 Units at 01/11/17 2056  . lisinopril (PRINIVIL,ZESTRIL) tablet 20 mg  20 mg Oral Daily Charm Rings, NP   20 mg at 01/12/17 6734  . magnesium hydroxide (MILK OF MAGNESIA) suspension 30 mL  30 mL Oral Daily PRN Charm Rings, NP      . metFORMIN (GLUCOPHAGE) tablet 1,000 mg  1,000 mg Oral BID WC Charm Rings, NP   1,000 mg at 01/11/17 1655  . montelukast (SINGULAIR) tablet 10 mg  10 mg Oral QHS Charm Rings, NP   10 mg at 01/11/17 2154  . nicotine polacrilex (NICORETTE) gum 2 mg  2 mg Oral PRN Eappen, Levin Bacon, MD      . ondansetron (ZOFRAN) tablet 4 mg  4 mg Oral Q8H PRN Charm Rings, NP      . pantoprazole (PROTONIX) EC tablet 40 mg  40 mg Oral Daily Charm Rings, NP   40 mg at 01/12/17 0906  . polyethylene glycol (MIRALAX / GLYCOLAX) packet 17 g  17 g Oral Daily Charm Rings, NP   17 g at 01/06/17 0830  . propranolol (INDERAL) tablet 20 mg  20 mg Oral BID Charm Rings, NP   20 mg at 01/12/17 1937  . simvastatin (ZOCOR) tablet 20 mg  20 mg Oral q1800 Cobos, Rockey Situ, MD   20 mg at 01/11/17 1700  . temazepam (RESTORIL) capsule 7.5 mg  7.5 mg Oral QHS Eappen, Saramma, MD   7.5 mg at 01/11/17 2206  . tuberculin injection 5 Units  5 Units Intradermal Once Jomarie Longs, MD   5 Units at 01/11/17 1335     Discharge Medications: Please see discharge summary for a list of discharge medications.  Relevant Imaging Results:  Relevant Lab Results:   Additional Information Patient also has asthma and is on psychotropics. SSN [id number]  Sallee Lange, LCSW

## 2017-01-12 NOTE — Progress Notes (Signed)
Recreation Therapy Notes  Date: 01/12/17 Time: 1000 Location: [address] Dayroom  Group Topic: Leisure Education  Goal Area(s) Addresses:  Patient will identify positive leisure activities.  Patient will identify one positive benefit of participation in leisure activities.   Intervention: Tin can with activities, white board, dry erase marker, eraser  Activity: Leisure Pictionary.  Patients had to pick a slip of paper from the tin can.  Each slip of paper had a leisure activity on it.  LRT divided the group into two groups.  Patients were to draw a picture of what was on their slip of paper.  Their team would get a minute to guess the picture.  If their team did not guess correctly, the other team got a chance to still the point.  The team with the most points won.  Education:  Leisure Education, Building control surveyor  Education Outcome: Acknowledges education/In group clarification offered/Needs additional education  Clinical Observations/Feedback: Pt did not attend group.   [name], LRT/CTRS         [name] A 01/12/2017 12:05 PM

## 2017-01-12 NOTE — Plan of Care (Signed)
Problem: Activity: Goal: Imbalance in normal sleep/wake cycle will improve Outcome: Progressing Pt slept a total of 6 hrs last night.

## 2017-01-12 NOTE — Plan of Care (Signed)
Problem: Safety: Goal: Periods of time without injury will increase Outcome: Progressing Patient is free from injury.  Routine safety checks maintained every 15 minutes.   

## 2017-01-12 NOTE — Social Work (Signed)
Patient referred to following ALF/FCH facilities via HUB for placement post discharge:  - [facility]  - [facility] - [facility] [place] [facility] - [facility]  - [facility] - [facility] - [facility] is reviewing and will visit patient to assess on 8/21 time TBD - [facility] - [employer] [facility] - [facility] - [facility] [employer] [facility] Awaiting response from facilities.  Santa Genera, LCSW Lead Clinical Social Worker Phone:  [phone]

## 2017-01-12 NOTE — Progress Notes (Signed)
Hypoglycemic Event  CBG: 59  Treatment: 15 GM carbohydrate snack  Symptoms: None  Follow-up CBG: Time:0920 CBG Result:139  Possible Reasons for Event: Unknown  Comments/MD notified:MD notified.  Diabetic consult ordered.    Audrie Lia Darian Cansler

## 2017-01-12 NOTE — Progress Notes (Signed)
[facility] MD Progress Note  01/12/2017 3:26 PM Tony Cunningham  MRN:  [id number]   Subjective:Patient states " I am anxious at night. Can I have something that I can take for anxiety when I am about to sleep.'     Objective:Patient seen and chart reviewed.Discussed patient with treatment team.  Pt today seen as less depressed , his affect is brighter. He reports some anxiety sx especially at bedtime. His sleep has improved on restoril, discussed adding vistaril for anxiety sx. Pt otherwise is noted as more interactive in milieu , takes care of ADL s with encouragement. Per RN his CBG s have been running low on and off , his metformin as well as insulin held this AM.  Continues to need support.     Principal Problem: Schizoaffective disorder, bipolar type ([place])  Diagnosis:   Patient Active Problem List   Diagnosis Date Noted  . Neuroleptic-induced Parkinsonism ([place]) [G21.11] 12/28/2016  . Schizoaffective disorder, bipolar type ([place]) [F25.0] 12/22/2016  . Schizoaffective disorder, depressive type ([place]) [F25.1] 09/23/2016  . Polysubstance abuse [F19.10] 07/17/2016  . Tardive dyskinesia [G24.01] 07/16/2016  . Tobacco use disorder [F17.200] 07/07/2016  . Dyslipidemia [E78.5] 07/07/2016  . Asthma [J45.909] 07/07/2016  . HTN (hypertension) [I10] 07/06/2016  . Diabetes ([place]) [E11.9] 12/25/2010   Total Time spent with patient: 25 minutes  Past Psychiatric History: Please see H&P.   Past Medical History:  Past Medical History:  Diagnosis Date  . Anxiety   . Asthma   . Diabetes mellitus   . High blood pressure   . Sinus complaint    History reviewed. No pertinent surgical history. Family History: Please see H&P.  Family Psychiatric  History: Please see H&P.  Social History:  History  Alcohol Use No     History  Drug Use No    Social History   Social History  . Marital status: Single    Spouse name: [name]  . Number of children: [name]  . Years of education: [name]   Social  History Main Topics  . Smoking status: Current [place] Smoker    Packs/day: 0.50    Types: Cigarettes  . Smokeless tobacco: Never Used  . Alcohol use No  . Drug use: No  . Sexual activity: Not Currently   Other Topics Concern  . None   Social History Narrative  . None   Additional Social History:   Sleep: Fair  Appetite:  Fair  Current Medications: Current Facility-Administered Medications  Medication Dose Route Frequency Provider Last Rate Last Dose  . alum & mag hydroxide-simeth (MAALOX/MYLANTA) 200-200-20 MG/5ML suspension 30 mL  30 mL Oral Q6H PRN Patrecia Pour, NP   30 mL at 01/08/17 2320  . amantadine (SYMMETREL) capsule 100 mg  100 mg Oral BID Ursula Alert, MD   100 mg at 01/12/17 0906  . benztropine (COGENTIN) tablet 1 mg  1 mg Oral BID Ursula Alert, MD   1 mg at 01/12/17 0906  . cholecalciferol (VITAMIN D) tablet 400 Units  400 Units Oral Daily Ursula Alert, MD   400 Units at 01/12/17 0908  . citalopram (CELEXA) tablet 20 mg  20 mg Oral Daily Lindell Spar I, NP   20 mg at 01/12/17 0906  . cloZAPine (CLOZARIL) tablet 150 mg  150 mg Oral QHS Derrill Center, NP   150 mg at 01/11/17 2154  . cyclobenzaprine (FLEXERIL) tablet 5 mg  5 mg Oral TID PRN Money, Lowry Ram, FNP   5 mg at 01/08/17 1607  .  diclofenac sodium (VOLTAREN) 1 % transdermal gel 2 g  2 g Topical QID Selah Klang, Ria Clock, MD   2 g at 01/11/17 2155  . hydrOXYzine (ATARAX/VISTARIL) tablet 25 mg  25 mg Oral QHS Catera Hankins, MD      . ibuprofen (ADVIL,MOTRIN) tablet 800 mg  800 mg Oral Q8H PRN Serrena Linderman, MD   800 mg at 01/11/17 1658  . insulin aspart (novoLOG) injection 0-15 Units  0-15 Units Subcutaneous TID WC Patrecia Pour, NP   3 Units at 01/12/17 1213  . insulin aspart (novoLOG) injection 0-5 Units  0-5 Units Subcutaneous QHS Patrecia Pour, NP   3 Units at 01/10/17 2220  . insulin aspart (novoLOG) injection 6 Units  6 Units Subcutaneous TID WC Cambreigh Dearing, MD      . insulin glargine  (LANTUS) injection 40 Units  40 Units Subcutaneous QHS Trudy Kory, MD      . lisinopril (PRINIVIL,ZESTRIL) tablet 20 mg  20 mg Oral Daily Patrecia Pour, NP   20 mg at 01/12/17 0907  . magnesium hydroxide (MILK OF MAGNESIA) suspension 30 mL  30 mL Oral Daily PRN Patrecia Pour, NP      . metFORMIN (GLUCOPHAGE) tablet 1,000 mg  1,000 mg Oral BID WC Patrecia Pour, NP   1,000 mg at 01/11/17 1655  . montelukast (SINGULAIR) tablet 10 mg  10 mg Oral QHS Patrecia Pour, NP   10 mg at 01/11/17 2154  . nicotine polacrilex (NICORETTE) gum 2 mg  2 mg Oral PRN Ailynn Gow, Ria Clock, MD      . ondansetron (ZOFRAN) tablet 4 mg  4 mg Oral Q8H PRN Patrecia Pour, NP      . pantoprazole (PROTONIX) EC tablet 40 mg  40 mg Oral Daily Patrecia Pour, NP   40 mg at 01/12/17 0906  . polyethylene glycol (MIRALAX / GLYCOLAX) packet 17 g  17 g Oral Daily Patrecia Pour, NP   17 g at 01/06/17 0830  . propranolol (INDERAL) tablet 20 mg  20 mg Oral BID Patrecia Pour, NP   20 mg at 01/12/17 2836  . simvastatin (ZOCOR) tablet 20 mg  20 mg Oral q1800 Cobos, Myer Peer, MD   20 mg at 01/11/17 1700  . temazepam (RESTORIL) capsule 7.5 mg  7.5 mg Oral QHS Nachum Derossett, MD   7.5 mg at 01/11/17 2206  . tuberculin injection 5 Units  5 Units Intradermal Once Ursula Alert, MD   5 Units at 01/11/17 1335   Lab Results:  Results for orders placed or performed during the hospital encounter of 12/22/16 (from the past 48 hour(s))  Glucose, capillary     Status: Abnormal   Collection Time: 01/10/17  4:46 PM  Result Value Ref Range   Glucose-Capillary 449 (H) 65 - 99 mg/dL  Glucose, capillary     Status: Abnormal   Collection Time: 01/10/17  5:27 PM  Result Value Ref Range   Glucose-Capillary 413 (H) 65 - 99 mg/dL  Urinalysis, dipstick only     Status: Abnormal   Collection Time: 01/10/17  6:00 PM  Result Value Ref Range   Color, Urine YELLOW (A) YELLOW   APPearance YELLOW (A) CLEAR   Specific Gravity, Urine 1.015 1.005  - 1.030   pH 7.0 5.0 - 8.0   Glucose, UA NEGATIVE NEGATIVE mg/dL   Hgb urine dipstick NEGATIVE NEGATIVE   Bilirubin Urine NEGATIVE NEGATIVE   Ketones, ur NEGATIVE NEGATIVE mg/dL   Protein, ur NEGATIVE  NEGATIVE mg/dL   Nitrite NEGATIVE NEGATIVE   Leukocytes, UA NEGATIVE NEGATIVE   RBC / HPF 0-5 0 - 5 RBC/hpf   WBC, UA 0-5 0 - 5 WBC/hpf   Bacteria, UA NONE SEEN NONE SEEN   Squamous Epithelial / LPF 0-5 (A) NONE SEEN    Comment: Performed at [facility], [place] [address], [place], [place] [address]  Glucose, capillary     Status: Abnormal   Collection Time: 01/10/17  6:23 PM  Result Value Ref Range   Glucose-Capillary 354 (H) 65 - 99 mg/dL  CBC with Differential/Platelet     Status: None   Collection Time: 01/10/17  6:35 PM  Result Value Ref Range   WBC 6.7 4.0 - 10.5 K/uL   RBC 4.36 4.22 - 5.81 MIL/uL   Hemoglobin 13.6 13.0 - 17.0 g/dL   HCT 39.2 39.0 - 52.0 %   MCV 89.9 78.0 - 100.0 fL   MCH 31.2 26.0 - 34.0 pg   MCHC 34.7 30.0 - 36.0 g/dL   RDW 12.9 11.5 - 15.5 %   Platelets 176 150 - 400 K/uL   Neutrophils Relative % 47 %   Neutro Abs 3.2 1.7 - 7.7 K/uL   Lymphocytes Relative 40 %   Lymphs Abs 2.7 0.7 - 4.0 K/uL   Monocytes Relative 8 %   Monocytes Absolute 0.5 0.1 - 1.0 K/uL   Eosinophils Relative 4 %   Eosinophils Absolute 0.3 0.0 - 0.7 K/uL   Basophils Relative 1 %   Basophils Absolute 0.1 0.0 - 0.1 K/uL    Comment: Performed at [facility], [place] [address], [place], [place] [address]  Basic metabolic panel     Status: Abnormal   Collection Time: 01/10/17  6:35 PM  Result Value Ref Range   Sodium 134 (L) 135 - 145 mmol/L   Potassium 4.4 3.5 - 5.1 mmol/L   Chloride 96 (L) 101 - 111 mmol/L   CO2 31 22 - 32 mmol/L   Glucose, Bld 381 (H) 65 - 99 mg/dL   BUN 10 6 - 20 mg/dL   Creatinine, Ser 1.03 0.61 - 1.24 mg/dL   Calcium 8.9 8.9 - 10.3 mg/dL   GFR calc non Af Amer >60 >60 mL/min   GFR calc Af Amer >60 >60 mL/min    Comment:  (NOTE) The eGFR has been calculated using the CKD EPI equation. This calculation has not been validated in all clinical situations. eGFR's persistently <60 mL/min signify possible Chronic Kidney Disease.    Anion gap 7 5 - 15    Comment: Performed at [facility], [place] [address], [place], [place] [address]  Glucose, capillary     Status: Abnormal   Collection Time: 01/10/17  7:58 PM  Result Value Ref Range   Glucose-Capillary 339 (H) 65 - 99 mg/dL  Glucose, capillary     Status: Abnormal   Collection Time: 01/10/17  9:59 PM  Result Value Ref Range   Glucose-Capillary 300 (H) 65 - 99 mg/dL  Glucose, capillary     Status: Abnormal   Collection Time: 01/11/17  6:10 AM  Result Value Ref Range   Glucose-Capillary 169 (H) 65 - 99 mg/dL  Glucose, capillary     Status: Abnormal   Collection Time: 01/11/17 12:08 PM  Result Value Ref Range   Glucose-Capillary 176 (H) 65 - 99 mg/dL  Glucose, capillary     Status: Abnormal   Collection Time: 01/11/17  4:52 PM  Result  Value Ref Range   Glucose-Capillary 212 (H) 65 - 99 mg/dL  Glucose, capillary     Status: Abnormal   Collection Time: 01/11/17  8:35 PM  Result Value Ref Range   Glucose-Capillary 120 (H) 65 - 99 mg/dL  Basic metabolic panel     Status: None   Collection Time: 01/12/17  6:17 AM  Result Value Ref Range   Sodium 140 135 - 145 mmol/L   Potassium 3.6 3.5 - 5.1 mmol/L    Comment: DELTA CHECK NOTED REPEATED TO VERIFY    Chloride 102 101 - 111 mmol/L   CO2 31 22 - 32 mmol/L   Glucose, Bld 67 65 - 99 mg/dL   BUN 11 6 - 20 mg/dL   Creatinine, Ser 0.62 0.61 - 1.24 mg/dL   Calcium 9.0 8.9 - 10.3 mg/dL   GFR calc non Af Amer >60 >60 mL/min   GFR calc Af Amer >60 >60 mL/min    Comment: (NOTE) The eGFR has been calculated using the CKD EPI equation. This calculation has not been validated in all clinical situations. eGFR's persistently <60 mL/min signify possible Chronic Kidney Disease.    Anion gap 7 5 -  15    Comment: Performed at [facility], [place] [address] [place], [place] [address]  Glucose, capillary     Status: None   Collection Time: 01/12/17  6:17 AM  Result Value Ref Range   Glucose-Capillary 70 65 - 99 mg/dL  Glucose, capillary     Status: Abnormal   Collection Time: 01/12/17  9:00 AM  Result Value Ref Range   Glucose-Capillary 59 (L) 65 - 99 mg/dL  Glucose, capillary     Status: Abnormal   Collection Time: 01/12/17  9:20 AM  Result Value Ref Range   Glucose-Capillary 139 (H) 65 - 99 mg/dL   Comment 1 Notify RN    Comment 2 Document in Chart   Glucose, capillary     Status: Abnormal   Collection Time: 01/12/17 11:52 AM  Result Value Ref Range   Glucose-Capillary 157 (H) 65 - 99 mg/dL   Blood Alcohol level:  Lab Results  Component Value Date   ETH <5 12/21/2016   ETH <5 73/42/8768   Metabolic Disorder Labs: Lab Results  Component Value Date   HGBA1C 10.3 (H) 11/09/2016   MPG 249 11/09/2016   MPG 275 10/09/2016   Lab Results  Component Value Date   PROLACTIN 24.5 (H) 09/24/2016   PROLACTIN 3.4 (L) 07/07/2016   Lab Results  Component Value Date   CHOL 129 12/28/2016   TRIG 78 12/28/2016   HDL 54 12/28/2016   CHOLHDL 2.4 12/28/2016   VLDL 16 12/28/2016   LDLCALC 59 12/28/2016   LDLCALC 42 11/09/2016   Physical Findings: AIMS: Facial and Oral Movements Muscles of Facial Expression: None, normal Lips and Perioral Area: None, normal Jaw: None, normal Tongue: None, normal,Extremity Movements Upper (arms, wrists, hands, fingers): Minimal Lower (legs, knees, ankles, toes): None, normal, Trunk Movements Neck, shoulders, hips: None, normal, Overall Severity Severity of abnormal movements (highest score from questions above): Minimal Incapacitation due to abnormal movements: None, normal Patient's awareness of abnormal movements (rate only patient's report): No Awareness, Dental Status Current problems with teeth and/or dentures?:  No Does patient usually wear dentures?: No  CIWA:  CIWA-Ar Total: 4 COWS:  COWS Total Score: 4  Musculoskeletal: Strength & Muscle Tone: within normal limits Gait & Station: normal Patient leans: [name]  Psychiatric Specialty Exam: Physical Exam  Nursing note and vitals reviewed.   Review of Systems  Psychiatric/Behavioral: The patient is nervous/anxious.   All other systems reviewed and are negative.   Blood pressure 127/81, pulse 97, temperature 98.3 F (36.8 C), temperature source Oral, resp. rate 20, height 6' (1.829 m), weight 81.4 kg (179 lb 8 oz), SpO2 100 %.Body mass index is 24.34 kg/m.  General Appearance: Fairly Groomed  Eye Contact:  Fair  Speech:  Normal Rate  Volume:  Decreased  Mood:  Anxious and Dysphoric  Affect:  Congruent  Thought Process:  Linear and Descriptions of Associations: Circumstantial  Orientation:  Full (Time, Place, and Person)  Thought Content:  Paranoid Ideationimproved , AH improving  Suicidal Thoughts:  No  Homicidal Thoughts:  No  Memory:  Immediate;   Fair Recent;   Fair Remote;   Fair  Judgement:  Fair  Insight:  Fair  Psychomotor Activity:  less restless, tremors improved  Concentration:  Concentration: Fair and Attention Span: Fair  Recall:  [employer] of Knowledge:  Fair  Language:  Fair  Akathisia:  No  Handed:  Right  AIMS (if indicated):    2  Assets:  Desire for Improvement Resilience Social Support  ADL's:  Intact  Cognition:  WNL  Sleep:  Number of Hours: 6   Treatment Plan Summary: Patient with schizoaffective do , hx of ECT treatments , presented disorganized , catatonic , currently making progress . Reports some anxiety sx at bedtime , will readjust his medications. Continue treatment.  Will continue today 01/12/17 plan as below except where it is noted.  Daily contact with patient to assess and evaluate symptoms and progress in treatment, Medication management and Plan see below   -Continue treatment plan and  medication management as below:  -Continue Clozaril 150 mg po qhs for psychosis.   -Continue Restoril 7.5 mg po qhs for sleep.  -Add Vistaril 25 mg po qhs for anxiety sx.  -Continue Amantadine 100 mg po bid for neuroleptic induced tremors.  -Continue Cogentin 1 mg po bid for EPS.  -Continue Celexa 20 mg po daily for affective sx.  -Continue Propranolol 20 mg po bid for tremors, anxiety.  -Continue Flexeril 5 mg po tid prn for muscle spasms , motrin 800 mg po q8h prn for pain.  -Patient had CBGs low this AM - diabetic consult called - will follow recommendations.  -Vitamin D replacement for low Vitamin D level.  Continue to monitor vitals ,medication compliance and treatment side effects.   Will monitor for medical issues as well as call consult as needed.  Reviewed labs tsh -wnl, vitamin b12 - wnl.  -Reviewed BMP repeat - Na+ wnl.  -CSW will continue to work on disposition. [facility] referral in progress.PPD given - repeat ( 01/11/17)  Danajah Birdsell, MD, 01/12/2017, 3:26 PM

## 2017-01-13 LAB — GLUCOSE, CAPILLARY
Glucose-Capillary: 195 mg/dL — ABNORMAL HIGH (ref 65–99)
Glucose-Capillary: 193 mg/dL — ABNORMAL HIGH (ref 65–99)
Glucose-Capillary: 382 mg/dL — ABNORMAL HIGH (ref 65–99)
Glucose-Capillary: 259 mg/dL — ABNORMAL HIGH (ref 65–99)
Glucose-Capillary: 278 mg/dL — ABNORMAL HIGH (ref 65–99)

## 2017-01-13 NOTE — Progress Notes (Signed)
Adult Psychoeducational Group Note  Date:  01/13/2017 Time:  9:02 PM  Group Topic/Focus:  Wrap-Up Group:   The focus of this group is to help patients review their daily goal of treatment and discuss progress on daily workbooks.  Participation Level:  Active  Participation Quality:  Appropriate  Affect:  Appropriate  Cognitive:  Appropriate  Insight: Appropriate  Engagement in Group:  Engaged  Modes of Intervention:  Discussion  Additional Comments:  The patient expressed that he rates today 9. The patient also said that he attended groups.  Octavio Manns 01/13/2017, 9:02 PM

## 2017-01-13 NOTE — Progress Notes (Signed)
Adult Psychoeducational Group Note  Date:  01/13/2017 Time:  12:41 AM  Group Topic/Focus:  Wrap-Up Group:   The focus of this group is to help patients review their daily goal of treatment and discuss progress on daily workbooks.  Participation Level:  Minimal  Participation Quality:  Appropriate  Affect:  Appropriate  Cognitive:  Appropriate  Insight: Appropriate and Limited  Engagement in Group:  Limited  Modes of Intervention:  Discussion  Additional Comments:  Pt stated his goal for today was keep his blood sugar down. Pt stated he was able to accomplished this today. Pt rated his overall day a 8 out of 10. Pt stated he was able to talk with his next placement today.  [name] 01/13/2017, 12:41 AM

## 2017-01-13 NOTE — Progress Notes (Signed)
[facility] MD Progress Note  01/13/2017 4:27 PM Tony Cunningham  MRN:  [id number]   Subjective:Pt states " I am ok."       Objective:Patient seen and chart reviewed.Discussed patient with treatment team.  Pt seen as less depressed, affect is brighter . Pt reports he sleeps ok.Per RN, pt is more visible in milieu. ADLS ok with encouragement .Taking medications with encouragement. Continue to support.      Principal Problem: Schizoaffective disorder, bipolar type ([place])  Diagnosis:   Patient Active Problem List   Diagnosis Date Noted  . 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 Schizoaffective disorder, bipolar type ([place]) [F25.0] 12/22/2016  . Schizoaffective disorder, depressive type ([place]) [F25.1] 09/23/2016  . Polysubstance abuse [F19.10] 07/17/2016  . Tardive dyskinesia [G24.01] 07/16/2016  . Tobacco use disorder [F17.200] 07/07/2016  . Dyslipidemia [E78.5] 07/07/2016  . Asthma [J45.909] 07/07/2016  . HTN (hypertension) [I10] 07/06/2016  . Diabetes ([place]) [E11.9] 12/25/2010   Total Time spent with patient: 20 minutes  Past Psychiatric History: Please see H&P.   Past Medical History:  Past Medical History:  Diagnosis Date  . Anxiety   . Asthma   . Diabetes mellitus   . High blood pressure   . Sinus complaint    History reviewed. No pertinent surgical history. Family History: Please see H&P.  Family Psychiatric  History: Please see H&P.  Social History:  History  Alcohol Use No     History  Drug Use No    Social History   Social History  . Marital status: Single    Spouse name: [name]  . Number of children: [name]  . Years of education: [name]   Social History Main Topics  . Smoking status: Current Every Day Smoker    Packs/day: 0.50    Types: Cigarettes  . Smokeless tobacco: Never Used  . Alcohol use No  . Drug use: No  . Sexual activity: Not Currently   Other Topics Concern  . None   Social History Narrative  . None   Additional  Social History:   Sleep: Fair  Appetite:  Fair  Current Medications: Current Facility-Administered Medications  Medication Dose Route Frequency Provider Last Rate Last Dose  . alum & mag hydroxide-simeth (MAALOX/MYLANTA) 200-200-20 MG/5ML suspension 30 mL  30 mL Oral Q6H PRN Patrecia Pour, NP   30 mL at 01/08/17 2320  . amantadine (SYMMETREL) capsule 100 mg  100 mg Oral BID Kariana Wiles, MD   100 mg at 01/13/17 0900  . benztropine (COGENTIN) tablet 1 mg  1 mg Oral BID Adrienne Delay, MD   1 mg at 01/13/17 0900  . cholecalciferol (VITAMIN D) tablet 400 Units  400 Units Oral Daily Ursula Alert, MD   400 Units at 01/13/17 0900  . citalopram (CELEXA) tablet 20 mg  20 mg Oral Daily Nwoko, Agnes I, NP   20 mg at 01/13/17 0900  . cloZAPine (CLOZARIL) tablet 150 mg  150 mg Oral QHS Derrill Center, NP   150 mg at 01/12/17 2109  . cyclobenzaprine (FLEXERIL) tablet 5 mg  5 mg Oral TID PRN Money, Lowry Ram, FNP   5 mg at 01/12/17 2109  . hydrOXYzine (ATARAX/VISTARIL) tablet 25 mg  25 mg Oral QHS Ursula Alert, MD   25 mg at 01/12/17 2109  . ibuprofen (ADVIL,MOTRIN) tablet 800 mg  800 mg Oral Q8H PRN Ursula Alert, MD   800 mg at 01/12/17 2109  . insulin aspart (novoLOG) injection 0-15 Units  0-15 Units Subcutaneous TID  WC Patrecia Pour, NP   2 Units at 01/13/17 1209  . insulin aspart (novoLOG) injection 0-5 Units  0-5 Units Subcutaneous QHS Patrecia Pour, NP   3 Units at 01/10/17 2220  . insulin aspart (novoLOG) injection 6 Units  6 Units Subcutaneous TID WC Ursula Alert, MD   6 Units at 01/13/17 1211  . insulin glargine (LANTUS) injection 40 Units  40 Units Subcutaneous QHS Darlis Wragg, MD      . lisinopril (PRINIVIL,ZESTRIL) tablet 20 mg  20 mg Oral Daily Patrecia Pour, NP   20 mg at 01/13/17 0900  . magnesium hydroxide (MILK OF MAGNESIA) suspension 30 mL  30 mL Oral Daily PRN Patrecia Pour, NP      . metFORMIN (GLUCOPHAGE) tablet 1,000 mg  1,000 mg Oral BID WC Patrecia Pour, NP   1,000 mg at 01/13/17 0900  . montelukast (SINGULAIR) tablet 10 mg  10 mg Oral QHS Patrecia Pour, NP   10 mg at 01/12/17 2109  . nicotine polacrilex (NICORETTE) gum 2 mg  2 mg Oral PRN Eloyse Causey, Ria Clock, MD      . ondansetron (ZOFRAN) tablet 4 mg  4 mg Oral Q8H PRN Patrecia Pour, NP      . pantoprazole (PROTONIX) EC tablet 40 mg  40 mg Oral Daily Patrecia Pour, NP   40 mg at 01/13/17 0900  . polyethylene glycol (MIRALAX / GLYCOLAX) packet 17 g  17 g Oral Daily Patrecia Pour, NP   17 g at 01/06/17 0830  . propranolol (INDERAL) tablet 20 mg  20 mg Oral BID Patrecia Pour, NP   20 mg at 01/13/17 0900  . simvastatin (ZOCOR) tablet 20 mg  20 mg Oral q1800 Cobos, Myer Peer, MD   20 mg at 01/12/17 1800  . temazepam (RESTORIL) capsule 7.5 mg  7.5 mg Oral QHS Ursula Alert, MD   7.5 mg at 01/12/17 2109   Lab Results:  Results for orders placed or performed during the hospital encounter of 12/22/16 (from the past 48 hour(s))  Glucose, capillary     Status: Abnormal   Collection Time: 01/11/17  4:52 PM  Result Value Ref Range   Glucose-Capillary 212 (H) 65 - 99 mg/dL  Glucose, capillary     Status: Abnormal   Collection Time: 01/11/17  8:35 PM  Result Value Ref Range   Glucose-Capillary 120 (H) 65 - 99 mg/dL  Basic metabolic panel     Status: None   Collection Time: 01/12/17  6:17 AM  Result Value Ref Range   Sodium 140 135 - 145 mmol/L   Potassium 3.6 3.5 - 5.1 mmol/L    Comment: DELTA CHECK NOTED REPEATED TO VERIFY    Chloride 102 101 - 111 mmol/L   CO2 31 22 - 32 mmol/L   Glucose, Bld 67 65 - 99 mg/dL   BUN 11 6 - 20 mg/dL   Creatinine, Ser 0.62 0.61 - 1.24 mg/dL   Calcium 9.0 8.9 - 10.3 mg/dL   GFR calc non Af Amer >60 >60 mL/min   GFR calc Af Amer >60 >60 mL/min    Comment: (NOTE) The eGFR has been calculated using the CKD EPI equation. This calculation has not been validated in all clinical situations. eGFR's persistently <60 mL/min signify possible Chronic  Kidney Disease.    Anion gap 7 5 - 15    Comment: Performed at [facility], [place] [address] [place], [place] [address]  Glucose,  capillary     Status: None   Collection Time: 01/12/17  6:17 AM  Result Value Ref Range   Glucose-Capillary 70 65 - 99 mg/dL  Glucose, capillary     Status: Abnormal   Collection Time: 01/12/17  9:00 AM  Result Value Ref Range   Glucose-Capillary 59 (L) 65 - 99 mg/dL  Glucose, capillary     Status: Abnormal   Collection Time: 01/12/17  9:20 AM  Result Value Ref Range   Glucose-Capillary 139 (H) 65 - 99 mg/dL   Comment 1 Notify RN    Comment 2 Document in Chart   Glucose, capillary     Status: Abnormal   Collection Time: 01/12/17 11:52 AM  Result Value Ref Range   Glucose-Capillary 157 (H) 65 - 99 mg/dL  Glucose, capillary     Status: Abnormal   Collection Time: 01/12/17  4:37 PM  Result Value Ref Range   Glucose-Capillary 201 (H) 65 - 99 mg/dL  Glucose, capillary     Status: None   Collection Time: 01/12/17  8:04 PM  Result Value Ref Range   Glucose-Capillary 84 65 - 99 mg/dL  Glucose, capillary     Status: Abnormal   Collection Time: 01/13/17  6:21 AM  Result Value Ref Range   Glucose-Capillary 195 (H) 65 - 99 mg/dL  Glucose, capillary     Status: Abnormal   Collection Time: 01/13/17 11:50 AM  Result Value Ref Range   Glucose-Capillary 193 (H) 65 - 99 mg/dL   Comment 1 Notify RN    Comment 2 Document in Chart    Blood Alcohol level:  Lab Results  Component Value Date   ETH <5 12/21/2016   ETH <5 16/02/9603   Metabolic Disorder Labs: Lab Results  Component Value Date   HGBA1C 10.3 (H) 11/09/2016   MPG 249 11/09/2016   MPG 275 10/09/2016   Lab Results  Component Value Date   PROLACTIN 24.5 (H) 09/24/2016   PROLACTIN 3.4 (L) 07/07/2016   Lab Results  Component Value Date   CHOL 129 12/28/2016   TRIG 78 12/28/2016   HDL 54 12/28/2016   CHOLHDL 2.4 12/28/2016   VLDL 16 12/28/2016   LDLCALC 59 12/28/2016    LDLCALC 42 11/09/2016   Physical Findings: AIMS: Facial and Oral Movements Muscles of Facial Expression: None, normal Lips and Perioral Area: None, normal Jaw: None, normal Tongue: None, normal,Extremity Movements Upper (arms, wrists, hands, fingers): None, normal Lower (legs, knees, ankles, toes): None, normal, Trunk Movements Neck, shoulders, hips: None, normal, Overall Severity Severity of abnormal movements (highest score from questions above): None, normal Incapacitation due to abnormal movements: None, normal Patient's awareness of abnormal movements (rate only patient's report): No Awareness, Dental Status Current problems with teeth and/or dentures?: No Does patient usually wear dentures?: No  CIWA:  CIWA-Ar Total: 4 COWS:  COWS Total Score: 4  Musculoskeletal: Strength & Muscle Tone: within normal limits Gait & Station: normal Patient leans: [name]  Psychiatric Specialty Exam: Physical Exam  Nursing note and vitals reviewed.   Review of Systems  Psychiatric/Behavioral: The patient is nervous/anxious.   All other systems reviewed and are negative.   Blood pressure 109/68, pulse 89, temperature 97.9 F (36.6 C), temperature source Oral, resp. rate 16, height 6' (1.829 m), weight 81.4 kg (179 lb 8 oz), SpO2 100 %.Body mass index is 24.34 kg/m.  General Appearance: Fairly Groomed  Eye Contact:  Fair  Speech:  Normal Rate  Volume:  Decreased  Mood:  Anxious  and Dysphoric, improved  Affect:  Congruent  Thought Process:  Linear and Descriptions of Associations: Circumstantial  Orientation:  Full (Time, Place, and Person)  Thought Content:  Paranoid Ideation improved  Suicidal Thoughts:  No  Homicidal Thoughts:  No  Memory:  Immediate;   Fair Recent;   Fair Remote;   Fair  Judgement:  Fair  Insight:  Fair  Psychomotor Activity:  Normal and tremors improved  Concentration:  Concentration: Fair and Attention Span: Fair  Recall:  [employer] of Knowledge:  Fair   Language:  Fair  Akathisia:  No  Handed:  Right  AIMS (if indicated):    2  Assets:  Desire for Improvement Resilience Social Support  ADL's:  Intact  Cognition:  WNL  Sleep:  Number of Hours: 6   Treatment Plan Summary: patient with schizoaffective do , hx of ECT , currently making progress , continue treatment. Pt accepted at [facility] . Continue treatment. Possible DC tomorrow.   Will continue today 01/13/17  plan as below except where it is noted.  Daily contact with patient to assess and evaluate symptoms and progress in treatment, Medication management and Plan see below   -Continue treatment plan and medication management as below:  -Continue Clozaril 150 mg po qhs for psychosis.   -Continue Restoril 7.5 mg po qhs for sleep.  -Add Vistaril 25 mg po qhs for anxiety sx.  -Continue Amantadine 100 mg po bid for neuroleptic induced tremors.  -Continue Cogentin 1 mg po bid for EPS.  -Continue Celexa 20 mg po daily for affective sx.  -Continue Propranolol 20 mg po bid for tremors, anxiety.  -Continue Flexeril 5 mg po tid prn for muscle spasms , motrin 800 mg po q8h prn for pain.  -Patient had CBGs low this AM - diabetic consult called - will follow recommendations.  -Vitamin D replacement for low Vitamin D level.  Continue to monitor vitals ,medication compliance and treatment side effects.   Will monitor for medical issues as well as call consult as needed.  Reviewed labs tsh -wnl, vitamin b12 - wnl.  -Reviewed BMP repeat - Na+ wnl.  -CSW will continue to work on disposition.Pt accepted at [facility] .PPD given - repeat ( 01/11/17)  Illana Nolting, MD, 01/13/2017, 4:27 PM

## 2017-01-13 NOTE — NC FL2 (Signed)
[place] MEDICAID FL2 LEVEL OF CARE SCREENING TOOL     IDENTIFICATION  Patient Name: Tony Cunningham Birthdate: [birth year] Sex: male Admission Date (Current Location): 12/22/2016  Tony Cunningham and Tony Cunningham Number:  Tony Cunningham [id number] Tony Cunningham MCD; Medicare [id number] C1 Facility and Address:   [facility]  [address]  Tony Cunningham [address])      Provider Number: [id number]  Attending Physician Name and Address:  Jomarie Longs, MD  Relative Name and Phone Number:  Tony Cunningham, mother.  [phone]    Current Level of Care: Hospital Recommended Level of Care: [facility], Assisted Living Facility Prior Approval Number:    Date Approved/Denied:   PASRR Number: [id number]  Discharge Plan: Domiciliary (Rest home)    Current Diagnoses: Patient Active Problem List   Diagnosis Date Noted  . 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 Schizoaffective disorder, bipolar type ([facility]) 12/22/2016  . Schizoaffective disorder, depressive type ([facility]) 09/23/2016  . Polysubstance abuse 07/17/2016  . Tardive dyskinesia 07/16/2016  . Tobacco use disorder 07/07/2016  . Dyslipidemia 07/07/2016  . Asthma 07/07/2016  . HTN (hypertension) 07/06/2016  . Diabetes ([facility]) 12/25/2010    Orientation RESPIRATION BLADDER Height & Weight     Self, Time, Situation, Place  Normal Continent Weight: 179 lb 8 oz (81.4 kg) Height:  6' (182.9 cm)  BEHAVIORAL SYMPTOMS/MOOD NEUROLOGICAL BOWEL NUTRITION STATUS     (None) Continent Diet (Diabetic diet)  AMBULATORY STATUS COMMUNICATION OF NEEDS Skin   Independent Verbally Normal                       Personal Care Assistance Level of Assistance  Bathing, Feeding, Dressing Bathing Assistance: Limited assistance Feeding assistance: Independent Dressing Assistance: Limited assistance     Functional Limitations Info  Sight, Hearing, Speech Sight Info: Adequate Hearing Info: Adequate Speech Info: Adequate     SPECIAL CARE FACTORS FREQUENCY  Diabetic urine testing Blood Pressure Frequency: has hypertension Diabetic Urine Testing Frequency: SEE MAR; sliding scale insulin required                Contractures Contractures Info: Not present    Additional Factors Info  Allergies   Allergies Info: no known allergies           Current Medications (01/13/2017):  This is the current hospital active medication list Current Facility-Administered Medications  Medication Dose Route Frequency Provider Last Rate Last Dose  . alum & mag hydroxide-simeth (MAALOX/MYLANTA) 200-200-20 MG/5ML suspension 30 mL  30 mL Oral Q6H PRN Charm Rings, NP   30 mL at 01/08/17 2320  . amantadine (SYMMETREL) capsule 100 mg  100 mg Oral BID Jomarie Longs, MD   100 mg at 01/12/17 1645  . benztropine (COGENTIN) tablet 1 mg  1 mg Oral BID Jomarie Longs, MD   1 mg at 01/12/17 1645  . cholecalciferol (VITAMIN D) tablet 400 Units  400 Units Oral Daily Jomarie Longs, MD   400 Units at 01/12/17 0908  . citalopram (CELEXA) tablet 20 mg  20 mg Oral Daily Armandina Stammer I, NP   20 mg at 01/12/17 0906  . cloZAPine (CLOZARIL) tablet 150 mg  150 mg Oral QHS Oneta Rack, NP   150 mg at 01/12/17 2109  . cyclobenzaprine (FLEXERIL) tablet 5 mg  5 mg Oral TID PRN Money, Gerlene Burdock, FNP   5 mg at 01/12/17 2109  . hydrOXYzine (ATARAX/VISTARIL) tablet 25 mg  25 mg Oral QHS Eappen, Saramma, MD   25 mg at  01/12/17 2109  . ibuprofen (ADVIL,MOTRIN) tablet 800 mg  800 mg Oral Q8H PRN Jomarie Longs, MD   800 mg at 01/12/17 2109  . insulin aspart (novoLOG) injection 0-15 Units  0-15 Units Subcutaneous TID WC Charm Rings, NP   3 Units at 01/13/17 0630  . insulin aspart (novoLOG) injection 0-5 Units  0-5 Units Subcutaneous QHS Charm Rings, NP   3 Units at 01/10/17 2220  . insulin aspart (novoLOG) injection 6 Units  6 Units Subcutaneous TID WC Jomarie Longs, MD   6 Units at 01/13/17 [phone]  . insulin glargine (LANTUS) injection  40 Units  40 Units Subcutaneous QHS Eappen, Saramma, MD      . lisinopril (PRINIVIL,ZESTRIL) tablet 20 mg  20 mg Oral Daily Charm Rings, NP   20 mg at 01/12/17 6568  . magnesium hydroxide (MILK OF MAGNESIA) suspension 30 mL  30 mL Oral Daily PRN Charm Rings, NP      . metFORMIN (GLUCOPHAGE) tablet 1,000 mg  1,000 mg Oral BID WC Charm Rings, NP   1,000 mg at 01/12/17 1643  . montelukast (SINGULAIR) tablet 10 mg  10 mg Oral QHS Charm Rings, NP   10 mg at 01/12/17 2109  . nicotine polacrilex (NICORETTE) gum 2 mg  2 mg Oral PRN Eappen, Levin Bacon, MD      . ondansetron (ZOFRAN) tablet 4 mg  4 mg Oral Q8H PRN Charm Rings, NP      . pantoprazole (PROTONIX) EC tablet 40 mg  40 mg Oral Daily Charm Rings, NP   40 mg at 01/12/17 0906  . polyethylene glycol (MIRALAX / GLYCOLAX) packet 17 g  17 g Oral Daily Charm Rings, NP   17 g at 01/06/17 0830  . propranolol (INDERAL) tablet 20 mg  20 mg Oral BID Charm Rings, NP   20 mg at 01/12/17 1645  . simvastatin (ZOCOR) tablet 20 mg  20 mg Oral q1800 Cobos, Rockey Situ, MD   20 mg at 01/12/17 1800  . temazepam (RESTORIL) capsule 7.5 mg  7.5 mg Oral QHS Eappen, Saramma, MD   7.5 mg at 01/12/17 2109  . tuberculin injection 5 Units  5 Units Intradermal Once Jomarie Longs, MD   5 Units at 01/11/17 1335     Discharge Medications: Please see discharge summary for a list of discharge medications.  Relevant Imaging Results:  Relevant Lab Results:   Additional Information Patient also has asthma and is on psychotropics. SSN [id number]  Sallee Lange, LCSW

## 2017-01-13 NOTE — Progress Notes (Signed)
DAR NOTE: Patient presents with anxious affect and depressed mood. Pt has been in the room most of the, not interacting much. Pt took a both, attended, and went for all his meals. Denies pain, auditory and visual hallucinations.  Rates depression at 0, hopelessness at 0, and anxiety at 0.  Maintained on routine safety checks.  Medications given as prescribed.  Support and encouragement offered as needed.  Attended group and participated.   Patient observed socializing with peers in the dayroom.  Offered no complaint.

## 2017-01-13 NOTE — Tx Team (Signed)
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 alum & mag hydroxide-simeth (MAALOX/MYLANTA) 200-200-20 MG/5ML suspension 30 mL  30 mL Oral Q6H PRN Patrecia Pour, NP   30 mL at 01/08/17 2320  . amantadine (SYMMETREL) capsule 100 mg  100 mg Oral BID Eappen, Saramma, MD   100 mg at 01/13/17 0900  . benztropine (COGENTIN) tablet 1 mg  1 mg Oral BID Eappen, Saramma, MD   1 mg at 01/13/17 0900  . cholecalciferol (VITAMIN D) tablet 400 Units  400 Units Oral Daily Ursula Alert, MD   400 Units at 01/13/17 0900  . citalopram (CELEXA) tablet 20 mg  20 mg Oral Daily Nwoko, Agnes I, NP   20 mg at 01/13/17 0900  . cloZAPine (CLOZARIL) tablet 150 mg  150 mg Oral QHS Derrill Center, NP   150 mg at 01/12/17 2109  . cyclobenzaprine (FLEXERIL) tablet 5 mg  5 mg Oral TID PRN Money, Lowry Ram, FNP   5 mg at 01/12/17 2109  . hydrOXYzine (ATARAX/VISTARIL) tablet 25 mg  25 mg Oral QHS Ursula Alert, MD   25 mg at 01/12/17 2109  . ibuprofen (ADVIL,MOTRIN) tablet 800 mg  800 mg Oral Q8H PRN Ursula Alert, MD   800 mg at 01/12/17 2109  . insulin aspart (novoLOG) injection 0-15 Units  0-15 Units Subcutaneous TID WC Patrecia Pour, NP   2 Units at 01/13/17 1209  . insulin aspart (novoLOG) injection 0-5 Units  0-5 Units Subcutaneous QHS Patrecia Pour, NP   3 Units at 01/10/17 2220  . insulin aspart (novoLOG) injection 6 Units  6 Units Subcutaneous TID WC Ursula Alert, MD   6 Units at 01/13/17 1211  . insulin glargine (LANTUS) injection 40 Units  40 Units Subcutaneous QHS Eappen, Saramma, MD      .  lisinopril (PRINIVIL,ZESTRIL) tablet 20 mg  20 mg Oral Daily Patrecia Pour, NP   20 mg at 01/13/17 0900  . magnesium hydroxide (MILK OF MAGNESIA) suspension 30 mL  30 mL Oral Daily PRN Patrecia Pour, NP      . metFORMIN (GLUCOPHAGE) tablet 1,000 mg  1,000 mg Oral BID WC Patrecia Pour, NP   1,000 mg at 01/13/17 0900  . montelukast (SINGULAIR) tablet 10 mg  10 mg Oral QHS Patrecia Pour, NP   10 mg at 01/12/17 2109  . nicotine polacrilex (NICORETTE) gum 2 mg  2 mg Oral PRN Eappen, Ria Clock, MD      . ondansetron (ZOFRAN) tablet 4 mg  4 mg Oral Q8H PRN Patrecia Pour, NP      . pantoprazole (PROTONIX) EC tablet 40 mg  40 mg Oral Daily Patrecia Pour, NP   40 mg at 01/13/17 0900  . polyethylene glycol (MIRALAX / GLYCOLAX) packet 17 g  17 g Oral Daily Patrecia Pour, NP   17 g at 01/06/17 0830  . propranolol (INDERAL) tablet 20 mg  20 mg Oral BID Patrecia Pour, NP   20 mg at 01/13/17 0900  . simvastatin (ZOCOR) tablet 20 mg  20 mg Oral q1800 Cobos, Myer Peer, MD   20 mg at 01/12/17 1800  .  temazepam (RESTORIL) capsule 7.5 mg  7.5 mg Oral QHS Eappen, Saramma, MD   7.5 mg at 01/12/17 2109    PTA Medications: Prescriptions Prior to Admission  Medication Sig Dispense Refill Last Dose  . cloZAPine (CLOZARIL) 100 MG tablet Take 1 tablet (100 mg total) by mouth at bedtime. 30 tablet 1 Past Week at Unknown time  . fluvoxaMINE (LUVOX) 100 MG tablet Take 1 tablet (100 mg total) by mouth at bedtime. 30 tablet 1 Past Week at Unknown time  . insulin aspart (NOVOLOG) 100 UNIT/ML injection Inject 10 Units into the skin 3 (three) times daily with meals. 10 mL 11 Past Week at Unknown time  . insulin glargine (LANTUS) 100 UNIT/ML injection Inject 0.55 mLs (55 Units total) into the skin at bedtime. 10 mL 11 Past Week at Unknown time  . lisinopril (PRINIVIL,ZESTRIL) 20 MG tablet Take 1 tablet (20 mg total) by mouth daily. 30 tablet 1 Past Week at Unknown time  . meloxicam (MOBIC) 7.5 MG tablet Take 1 tablet  (7.5 mg total) by mouth 2 (two) times daily after a meal. 60 tablet 1 Past Week at Unknown time  . metFORMIN (GLUCOPHAGE) 1000 MG tablet Take 1 tablet (1,000 mg total) by mouth 2 (two) times daily with a meal. 60 tablet 1 Past Week at Unknown time  . montelukast (SINGULAIR) 10 MG tablet Take 1 tablet (10 mg total) by mouth at bedtime. 30 tablet 1 Past Week at Unknown time  . nicotine (NICODERM CQ - DOSED IN MG/24 HR) 7 mg/24hr patch Place 1 patch (7 mg total) onto the skin daily. 28 patch 0 Past Week at Unknown time  . pantoprazole (PROTONIX) 40 MG tablet Take 1 tablet (40 mg total) by mouth daily. 30 tablet 1 Past Week at Unknown time  . polyethylene glycol (MIRALAX / GLYCOLAX) packet Take 17 g by mouth daily. 14 each 1 Past Week at Unknown time  . propranolol (INDERAL) 20 MG tablet Take 1 tablet (20 mg total) by mouth 2 (two) times daily. 60 tablet 1 Past Week at Unknown time  . simvastatin (ZOCOR) 40 MG tablet Take 1 tablet (40 mg total) by mouth daily at 6 PM. 30 tablet 1 Past Week at Unknown time  . temazepam (RESTORIL) 15 MG capsule Take 1 capsule (15 mg total) by mouth at bedtime. 30 capsule 1 Past Week at Unknown time    Patient Stressors: Financial difficulties Medication change or noncompliance  Patient Strengths: Network engineer for treatment/growth Supportive family/friends  Treatment Modalities: Medication Management, Group therapy, Case management,  1 to 1 session with clinician, Psychoeducation, Recreational therapy.   Physician Treatment Plan for Primary Diagnosis: Schizoaffective Disorder, Bipolar Type Long Term Goal(s): Improvement in symptoms so as ready for discharge  Short Term Goals: Ability to identify changes in lifestyle to reduce recurrence of condition will improve Ability to verbalize feelings will improve Ability to disclose and discuss suicidal ideas Ability to demonstrate self-control will improve Ability to  identify and develop effective coping behaviors will improve Compliance with prescribed medications will improve  Medication Management: Evaluate patient's response, side effects, and tolerance of medication regimen.  Therapeutic Interventions: 1 to 1 sessions, Unit Group sessions and Medication administration.  Evaluation of Outcomes: Adequate for Discharge   8/6: Anxiety/HTN: Continue Propranolol 20 mg daily.  Mood Control/Stabilization:  Continue Clozapine (Clozaril) from 171m Q hs.  Depression: Continue Fluvoxamine (Luvox) 100 mg Q hs.  8/9: Patient with schizoaffective do , past hx of ECT ,  recent decompensation , seen as having sleep issues, status changed to do not admit as he has been taking roommate's belongings   Clozaril 150 mg po qhs for psychosis. Clozaril level pending. Increase Trazodone to 100 mg po qhs for sleep. Continue Amantadine 100 mg po bid for neuroleptic induced tremors. Will continue Luvox 100 mg po at qhs for affective sx. Propranolol 20 mg po bid for tremors, anxiety.  Insomnia: Continue Trazodone  50 mg prn at bedtime.  8/14: currently making slow progress on the current medications. He does continues to have negative sx , but is making progress .  Pt will do better in a supervised structured place - CSW will work on placement .   Will continue today 01/05/17 plan as below except where it is noted.  -Continue Clozaril 150 mg po qhs for psychosis. Clozaril level - 598 , CBC with diff - wbc - 7.1 , ANC- 3.0 , Repeat CBC with diff every 2 weeks .  -Continue Restoril 7.5 mg po qhs for sleep.  -Continue Amantadine 100 mg po bid for neuroleptic induced tremors.  -Continue Cogentin to 1 mg po bid for EPS.  -Continue Celexa 20 mg po daily for affective sx.  -Continue Propranolol 20 mg po bid for tremors, anxiety.  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  Therapeutic Interventions: 1 to 1 sessions, Unit Group sessions and Medication administration.  Evaluation of Outcomes: Adequate for Discharge   RN Treatment Plan for Primary Diagnosis: Schizoaffective Disorder, Bipolar Type Long Term Goal(s): Knowledge of disease and therapeutic regimen to maintain health will improve  Short Term Goals: Ability to identify and develop effective coping behaviors will improve and Compliance with prescribed medications will improve  Medication Management: RN will administer medications as ordered by provider, will assess and evaluate patient's response and provide education to patient for prescribed medication. RN will report any adverse and/or side effects to prescribing provider.  Therapeutic Interventions: 1 on 1 counseling sessions, Psychoeducation, Medication administration, Evaluate responses to treatment, Monitor vital signs and CBGs as ordered, Perform/monitor CIWA, COWS, AIMS and Fall Risk screenings as ordered, Perform wound care treatments as ordered.  Evaluation of Outcomes: Adequate for Discharge   LCSW Treatment Plan for Primary Diagnosis: Schizoaffective Disorder, Bipolar Type Long Term Goal(s): Safe transition to appropriate next level of care at discharge, Engage patient in therapeutic group addressing interpersonal concerns.  Short  Term Goals: Engage patient in aftercare planning with referrals and resources  Therapeutic Interventions: Assess for all discharge needs, 1 to 1 time with Social worker, Explore available resources and support systems, Assess for adequacy in community support network, Educate family and significant other(s) on suicide prevention, Complete Psychosocial Assessment, Interpersonal group therapy.  Evaluation of Outcomes: Met  [facility], follow up [facility] and [facility] for peer support  8/21:  Pt ultimately unable to go to [facility] due to their inability to deal with his insulin needs.  Have identified new provider-[facility] in [place].  mental health provider likley Veleta Miners be confirmed   Progress in Treatment: Attending groups: Intermittently Participating in groups: Minimally Taking medication as prescribed: Yes Toleration medication: Yes, no side effects reported at this time Family/Significant other contact made: No Patient understands diagnosis: Yes AEB asking for help with suicidal thoughts Discussing patient identified problems/goals with staff: Yes Medical problems stabilized or resolved: Yes Denies suicidal/homicidal ideation: Yes Issues/concerns per patient self-inventory: None Other: [name]  New problem(s) identified: None identified at this time.   New Short Term/Long Term Goal(s): "I want to feel better. I'm too sleepy."  Discharge Plan or Barriers:group home placement  Reason for Continuation of Hospitalization:    Estimated Length of Stay:D/C Thursday, Friday at the latest  Attendees: Patient:  01/13/2017  3:06 PM  Physician: Ursula Alert, MD 01/13/2017  3:06 PM  Nursing: Darrol Angel, RN 01/13/2017  3:06 PM  RN Care Manager: Lars Pinks, RN 01/13/2017  3:06 PM  Social Worker: Ripley Fraise 01/13/2017  3:06 PM  Recreational Therapist: Winfield Cunas 01/13/2017  3:06 PM  Other: Norberto Sorenson 01/13/2017  3:06 PM  Other:  01/13/2017  3:06 PM     Scribe for Treatment Team:  Roque Lias LCSW 01/13/2017 3:06 PM

## 2017-01-13 NOTE — [facility] Group Notes (Signed)
[facility] LCSW Group Therapy  01/13/2017 3:02 PM   Type of Therapy:  Group Therapy  Participation Level:  Active  Participation Quality:  Attentive  Affect:  Appropriate  Cognitive:  Appropriate  Insight:  Improving  Engagement in Therapy:  Engaged  Modes of Intervention:  Clarification, Education, Exploration and Socialization  Summary of Progress/Problems: Today's group focused on resilience.  Tony Cunningham attended group.  He appeared to be using his coping skill of rocking to help him focus during group.  He shared that his faith and ability to ask for forgiveness has helped him to be resilient.   [name] 01/13/2017 , 3:02 PM

## 2017-01-14 LAB — GLUCOSE, CAPILLARY
GLUCOSE-CAPILLARY: 247 mg/dL — AB (ref 65–99)
GLUCOSE-CAPILLARY: 206 mg/dL — AB (ref 65–99)
Glucose-Capillary: 268 mg/dL — ABNORMAL HIGH (ref 65–99)
Glucose-Capillary: 200 mg/dL — ABNORMAL HIGH (ref 65–99)

## 2017-01-14 NOTE — [facility] Group Notes (Signed)
[facility] Group Therapy  01/14/2017 , 1:39 PM    Type of Therapy:  [facility] [employer] Presentation  Participation Level:  Active  Participation Quality:  Attentive  Affect:  Blunted  Cognitive:  Oriented  Insight:  Limited  Engagement in Therapy:  Engaged  Modes of Intervention:  Discussion, Education and Socialization  Summary of Progress/Problems:  Onalee Hua from [employer] came to present his recovery story.  Juandavid came to group late.  While in group he actively listened to the discussion but, did not speak.     [name] 01/14/2017 , 1:39 PM

## 2017-01-14 NOTE — Progress Notes (Signed)
D: Patient continues to isolate in his room. Cooperative and compliant with medication. CBG at bedtime was 278 mg/dl. 3 unit per sliding scale was given. Patient denies pain, SI/HI, AH/VH at this time. States his day was "so-so". Rated depression and anxiety 3/10 and 5/10 respectively. No behavior issues noted.   A: Staff offered support, education and encouragement as needed. Routine safety checks maintained. Will continue to monitor patient.  R: Patient remains appropriate on unit.

## 2017-01-14 NOTE — Progress Notes (Signed)
Pt present with bright affect and pleasant mood in the unit. Pt has been observed seating in the dayroom with peers. Pt attended group and participated. Pt has been interested to know when he is getting discharged and keep on asking if he is going to be okay. Pt reassured, support and encouraged offered as needed. Medications given as scheduled, no reactions observed. As per self inventory, pt had a fair sleep, fair appetite, normal energy and poor concentration. Pt denies SI/HI, AVH and verbally contracted for safety. Support and encouragement offered as needed, will continue to monitor.

## 2017-01-14 NOTE — Progress Notes (Signed)
Adult Psychoeducational Group Note  Date:  01/14/2017 Time:  9:04 PM  Group Topic/Focus:  Wrap-Up Group:   The focus of this group is to help patients review their daily goal of treatment and discuss progress on daily workbooks.  Participation Level:  Active  Participation Quality:  Attentive  Affect:  Appropriate  Cognitive:  Alert  Insight: Limited  Engagement in Group:  Engaged  Modes of Intervention:  Discussion, Education, Socialization and Support  Additional Comments:  Pt rated his day at a 6 out of 10. Pt did not share a goal he had for the day, but he is hoping to discharge tomorrow. Pt reported he was able to go outside today, which was a positive aspect to his day.  Tony Cunningham 01/14/2017, 9:04 PM

## 2017-01-14 NOTE — Plan of Care (Signed)
Problem: Activity: Goal: Interest or engagement in leisure activities will improve Outcome: Progressing Pt observed in the dayroom watching TV with peers.

## 2017-01-14 NOTE — Progress Notes (Signed)
[facility] MD Progress Note  01/14/2017 12:25 PM Quentavious Rittenhouse  MRN:  [id number]   Subjective: Pt states " I am doing so-so today."  Objective: Patient seen and chart reviewed. Discussed patient with treatment team.  Patient seen in his room, lying down on his bed, with mild psychomotor retardation, isolation, withdrawn and at the same time he is able to respond verbally for a little while before he turned away and not responding. Reportedly he attended a couple of groups yesterday and did not attend this morning group. He is seen as less depressed, affect is brighter. Patient denied sleep and appetite problems today.  Continue to support.   Principal Problem: Schizoaffective disorder, bipolar type (HCC)  Diagnosis:   Patient Active Problem List   Diagnosis Date Noted  . 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 Schizoaffective disorder, bipolar type (HCC) [F25.0] 12/22/2016  . Schizoaffective disorder, depressive type (HCC) [F25.1] 09/23/2016  . Polysubstance abuse [F19.10] 07/17/2016  . Tardive dyskinesia [G24.01] 07/16/2016  . Tobacco use disorder [F17.200] 07/07/2016  . Dyslipidemia [E78.5] 07/07/2016  . Asthma [J45.909] 07/07/2016  . HTN (hypertension) [I10] 07/06/2016  . Diabetes (HCC) [E11.9] 12/25/2010   Total Time spent with patient: 20 minutes  Past Psychiatric History: Please see H&P.   Past Medical History:  Past Medical History:  Diagnosis Date  . Anxiety   . Asthma   . Diabetes mellitus   . High blood pressure   . Sinus complaint    History reviewed. No pertinent surgical history. Family History: Please see H&P.  Family Psychiatric  History: Please see H&P.  Social History:  History  Alcohol Use No     History  Drug Use No    Social History   Social History  . Marital status: Single    Spouse name: [name]  . Number of children: [name]  . Years of education: [name]   Social History Main Topics  . Smoking status: Current Every Day Smoker   Packs/day: 0.50    Types: Cigarettes  . Smokeless tobacco: Never Used  . Alcohol use No  . Drug use: No  . Sexual activity: Not Currently   Other Topics Concern  . None   Social History Narrative  . None   Additional Social History:   Sleep: Fair  Appetite:  Fair  Current Medications: Current Facility-Administered Medications  Medication Dose Route Frequency Provider Last Rate Last Dose  . alum & mag hydroxide-simeth (MAALOX/MYLANTA) 200-200-20 MG/5ML suspension 30 mL  30 mL Oral Q6H PRN Charm Rings, NP   30 mL at 01/08/17 2320  . amantadine (SYMMETREL) capsule 100 mg  100 mg Oral BID Jomarie Longs, MD   100 mg at 01/14/17 0810  . benztropine (COGENTIN) tablet 1 mg  1 mg Oral BID Jomarie Longs, MD   1 mg at 01/14/17 0810  . cholecalciferol (VITAMIN D) tablet 400 Units  400 Units Oral Daily Jomarie Longs, MD   400 Units at 01/14/17 0810  . citalopram (CELEXA) tablet 20 mg  20 mg Oral Daily Armandina Stammer I, NP   20 mg at 01/14/17 0810  . cloZAPine (CLOZARIL) tablet 150 mg  150 mg Oral QHS Oneta Rack, NP   150 mg at 01/13/17 2120  . cyclobenzaprine (FLEXERIL) tablet 5 mg  5 mg Oral TID PRN Money, Gerlene Burdock, FNP   5 mg at 01/12/17 2109  . hydrOXYzine (ATARAX/VISTARIL) tablet 25 mg  25 mg Oral QHS Jomarie Longs, MD   25 mg at 01/13/17 2120  .  ibuprofen (ADVIL,MOTRIN) tablet 800 mg  800 mg Oral Q8H PRN Jomarie Longs, MD   800 mg at 01/12/17 2109  . insulin aspart (novoLOG) injection 0-15 Units  0-15 Units Subcutaneous TID WC Charm Rings, NP   8 Units at 01/14/17 1203  . insulin aspart (novoLOG) injection 0-5 Units  0-5 Units Subcutaneous QHS Charm Rings, NP   3 Units at 01/13/17 2231  . insulin aspart (novoLOG) injection 6 Units  6 Units Subcutaneous TID WC Jomarie Longs, MD   6 Units at 01/14/17 1204  . insulin glargine (LANTUS) injection 40 Units  40 Units Subcutaneous QHS Jomarie Longs, MD   40 Units at 01/13/17 2114  . lisinopril (PRINIVIL,ZESTRIL) tablet  20 mg  20 mg Oral Daily Charm Rings, NP   20 mg at 01/14/17 0810  . magnesium hydroxide (MILK OF MAGNESIA) suspension 30 mL  30 mL Oral Daily PRN Charm Rings, NP      . metFORMIN (GLUCOPHAGE) tablet 1,000 mg  1,000 mg Oral BID WC Charm Rings, NP   1,000 mg at 01/14/17 0810  . montelukast (SINGULAIR) tablet 10 mg  10 mg Oral QHS Charm Rings, NP   10 mg at 01/13/17 2120  . nicotine polacrilex (NICORETTE) gum 2 mg  2 mg Oral PRN Eappen, Levin Bacon, MD      . ondansetron (ZOFRAN) tablet 4 mg  4 mg Oral Q8H PRN Charm Rings, NP      . pantoprazole (PROTONIX) EC tablet 40 mg  40 mg Oral Daily Charm Rings, NP   40 mg at 01/14/17 0810  . polyethylene glycol (MIRALAX / GLYCOLAX) packet 17 g  17 g Oral Daily Charm Rings, NP   17 g at 01/06/17 0830  . propranolol (INDERAL) tablet 20 mg  20 mg Oral BID Charm Rings, NP   20 mg at 01/14/17 0810  . simvastatin (ZOCOR) tablet 20 mg  20 mg Oral q1800 Cobos, Rockey Situ, MD   20 mg at 01/13/17 1721  . temazepam (RESTORIL) capsule 7.5 mg  7.5 mg Oral QHS Eappen, Saramma, MD   7.5 mg at 01/13/17 2249   Lab Results:  Results for orders placed or performed during the hospital encounter of 12/22/16 (from the past 48 hour(s))  Glucose, capillary     Status: Abnormal   Collection Time: 01/12/17  4:37 PM  Result Value Ref Range   Glucose-Capillary 201 (H) 65 - 99 mg/dL  Glucose, capillary     Status: None   Collection Time: 01/12/17  8:04 PM  Result Value Ref Range   Glucose-Capillary 84 65 - 99 mg/dL  Glucose, capillary     Status: Abnormal   Collection Time: 01/13/17  6:21 AM  Result Value Ref Range   Glucose-Capillary 195 (H) 65 - 99 mg/dL  Glucose, capillary     Status: Abnormal   Collection Time: 01/13/17 11:50 AM  Result Value Ref Range   Glucose-Capillary 193 (H) 65 - 99 mg/dL   Comment 1 Notify RN    Comment 2 Document in Chart   Glucose, capillary     Status: Abnormal   Collection Time: 01/13/17  4:49 PM  Result Value Ref  Range   Glucose-Capillary 382 (H) 65 - 99 mg/dL   Comment 1 Notify RN    Comment 2 Document in Chart   Glucose, capillary     Status: Abnormal   Collection Time: 01/13/17  8:24 PM  Result Value Ref Range  Glucose-Capillary 259 (H) 65 - 99 mg/dL  Glucose, capillary     Status: Abnormal   Collection Time: 01/13/17 10:27 PM  Result Value Ref Range   Glucose-Capillary 278 (H) 65 - 99 mg/dL  Glucose, capillary     Status: Abnormal   Collection Time: 01/14/17  6:09 AM  Result Value Ref Range   Glucose-Capillary 247 (H) 65 - 99 mg/dL   Comment 1 Notify RN   Glucose, capillary     Status: Abnormal   Collection Time: 01/14/17 12:01 PM  Result Value Ref Range   Glucose-Capillary 268 (H) 65 - 99 mg/dL   Comment 1 Notify RN    Comment 2 Document in Chart    Blood Alcohol level:  Lab Results  Component Value Date   ETH <5 12/21/2016   ETH <5 10/21/2016   Metabolic Disorder Labs: Lab Results  Component Value Date   HGBA1C 10.3 (H) 11/09/2016   MPG 249 11/09/2016   MPG 275 10/09/2016   Lab Results  Component Value Date   PROLACTIN 24.5 (H) 09/24/2016   PROLACTIN 3.4 (L) 07/07/2016   Lab Results  Component Value Date   CHOL 129 12/28/2016   TRIG 78 12/28/2016   HDL 54 12/28/2016   CHOLHDL 2.4 12/28/2016   VLDL 16 12/28/2016   LDLCALC 59 12/28/2016   LDLCALC 42 11/09/2016   Physical Findings: AIMS: Facial and Oral Movements Muscles of Facial Expression: None, normal Lips and Perioral Area: None, normal Jaw: None, normal Tongue: None, normal,Extremity Movements Upper (arms, wrists, hands, fingers): None, normal Lower (legs, knees, ankles, toes): None, normal, Trunk Movements Neck, shoulders, hips: None, normal, Overall Severity Severity of abnormal movements (highest score from questions above): None, normal Incapacitation due to abnormal movements: None, normal Patient's awareness of abnormal movements (rate only patient's report): No Awareness, Dental Status Current  problems with teeth and/or dentures?: No Does patient usually wear dentures?: No  CIWA:  CIWA-Ar Total: 4 COWS:  COWS Total Score: 4  Musculoskeletal: Strength & Muscle Tone: within normal limits Gait & Station: normal Patient leans: [name]  Psychiatric Specialty Exam: Physical Exam  Nursing note and vitals reviewed.   Review of Systems  Psychiatric/Behavioral: The patient is nervous/anxious.   All other systems reviewed and are negative.   Blood pressure 106/73, pulse 96, temperature 98.1 F (36.7 C), temperature source Oral, resp. rate 20, height 6' (1.829 m), weight 81.4 kg (179 lb 8 oz), SpO2 100 %.Body mass index is 24.34 kg/m.  General Appearance: Fairly Groomed  Eye Contact:  Fair  Speech:  Normal Rate  Volume:  Decreased  Mood:  Anxious and Dysphoric, improved  Affect:  Congruent  Thought Process:  Linear and Descriptions of Associations: Circumstantial  Orientation:  Full (Time, Place, and Person)  Thought Content:  Paranoid Ideation improved  Suicidal Thoughts:  No  Homicidal Thoughts:  No  Memory:  Immediate;   Fair Recent;   Fair Remote;   Fair  Judgement:  Fair  Insight:  Fair  Psychomotor Activity:  Normal and tremors improved  Concentration:  Concentration: Fair and Attention Span: Fair  Recall:  [employer] of Knowledge:  Fair  Language:  Fair  Akathisia:  No  Handed:  Right  AIMS (if indicated):    2  Assets:  Desire for Improvement Resilience Social Support  ADL's:  Intact  Cognition:  WNL  Sleep:  Number of Hours: 6   Treatment Plan Summary: patient with schizoaffective do , hx of ECT , currently making  progress , continue treatment. Patient accepted at [facility] .  Continue treatment and possible DC tomorrow.  Will continue today 01/14/17  plan as below except where it is noted.  Daily contact with patient to assess and evaluate symptoms and progress in treatment, Medication management and Plan see below   -Continue treatment plan and medication  management as below:  -Continue Clozaril 150 mg po qhs for psychosis.   -Continue Restoril 7.5 mg po qhs for sleep.  -Continue Vistaril 25 mg po qhs for anxiety sx.  -Continue Amantadine 100 mg po bid for neuroleptic induced tremors.  -Continue Cogentin 1 mg po bid for EPS.  -Continue Celexa 20 mg po daily for affective sx.  -Continue Propranolol 20 mg po bid for tremors, anxiety.  -Continue Flexeril 5 mg po tid prn for muscle spasms , motrin 800 mg po q8h prn for pain.  -Monitor blood glucose.  -Vitamin D replacement for low Vitamin D level.  Continue to monitor vitals ,medication compliance and treatment side effects.   Will monitor for medical issues as well as call consult as needed.  Reviewed labs tsh -wnl, vitamin b12 - wnl.  -Reviewed BMP repeat - Na+ wnl.  -CSW will continue to work on disposition.Pt accepted at [facility] .PPD given - repeat ( 01/11/17)  Leata Mouse, MD, 01/14/2017, 12:25 PM

## 2017-01-14 NOTE — Progress Notes (Signed)
Inpatient Diabetes Program Recommendations  AACE/ADA: New Consensus Statement on Inpatient Glycemic Control (2015)  Target Ranges:  Prepandial:   less than 140 mg/dL      Peak postprandial:   less than 180 mg/dL (1-2 hours)      Critically ill patients:  140 - 180 mg/dL   Results for Tony Cunningham, Tony Cunningham (MRN [id number]) as of 01/14/2017 14:35  Ref. Range 01/13/2017 06:21 01/13/2017 11:50 01/13/2017 16:49 01/13/2017 20:24 01/13/2017 22:27  Glucose-Capillary Latest Ref Range: 65 - 99 mg/dL 093 (H) 818 (H) 299 (H) 259 (H) 278 (H)   Results for Tony Cunningham, Tony Cunningham (MRN [id number]) as of 01/14/2017 14:35  Ref. Range 01/14/2017 06:09 01/14/2017 12:01  Glucose-Capillary Latest Ref Range: 65 - 99 mg/dL 381 (H) 017 (H)    Outpatient Diabetes medications: Lantus 55 units QHS                                                        Novolog 10 units TID with meals                                                        Metformin 1000 mg BID  Current Insulin Orders: Lantus 40 units QHS      Novolog Moderate Correction Scale/ SSI (0-15 units) TID AC + HS                                       Novolog 6 units TID                                       Metformin 1000 mg BID     MD- Please consider the following in-hospital insulin adjustments:  1. Increase Lantus slightly to 43 units QHS  2. Increase Novolog Meal Coverage to: Novolog 8 units TID with meals (hold if pt eats <50% of meal)       --Will follow patient during hospitalization--  Ambrose Finland RN, MSN, CDE Diabetes Coordinator Inpatient Glycemic Control Team Team Pager: [phone] (8a-5p)

## 2017-01-15 LAB — GLUCOSE, CAPILLARY
GLUCOSE-CAPILLARY: 51 mg/dL — AB (ref 65–99)
GLUCOSE-CAPILLARY: 165 mg/dL — AB (ref 65–99)
GLUCOSE-CAPILLARY: 233 mg/dL — AB (ref 65–99)

## 2017-01-15 MED ORDER — INSULIN ASPART 100 UNIT/ML ~~LOC~~ SOLN: 6.0000 [IU] | Freq: Three times a day (TID) | SUBCUTANEOUS | 11 refills | Status: DC

## 2017-01-15 MED ORDER — INSULIN GLARGINE 100 UNIT/ML ~~LOC~~ SOLN: 40.0000 [IU] | Freq: Every day | SUBCUTANEOUS | 11 refills | Status: DC

## 2017-01-15 NOTE — BHH Suicide Risk Assessment (Signed)
[facility] Discharge Suicide Risk Assessment   Principal Problem: Schizoaffective disorder, bipolar type [facility]) Discharge Diagnoses:  Patient Active Problem List   Diagnosis Date Noted  . 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 Schizoaffective disorder, bipolar type (HCC) [F25.0] 12/22/2016  . Schizoaffective disorder, depressive type (HCC) [F25.1] 09/23/2016  . Polysubstance abuse [F19.10] 07/17/2016  . Tardive dyskinesia [G24.01] 07/16/2016  . Tobacco use disorder [F17.200] 07/07/2016  . Dyslipidemia [E78.5] 07/07/2016  . Asthma [J45.909] 07/07/2016  . HTN (hypertension) [I10] 07/06/2016  . Diabetes [facility]) [E11.9] 12/25/2010  Patient is a 41 year old male transferred from [facility] hospital for stabilization and treatment of worsening of psychosis, threatening to cut himself with a knife. Patient lives with his mother, is diagnosed with schizoaffective disorder, on initial presentation was seen responding to internal stimuli, was minimally responsive. Patient did acknowledge she was having hallucinations stating that he sees bad people.  This morning, patient states that he is a little anxious about going home but is no longer seeing things, does not seem to be responding to internal stimuli but states again that he's a little anxious about returning home. Patient denies any side effects of his medications, denies any thoughts of self, harm to others. He is also not paranoid. Patient is eating fine and sleeping well  Total Time spent with patient: 30 minutes  Musculoskeletal: Strength & Muscle Tone: within normal limits Gait & Station: normal Patient leans: [name]  Psychiatric Specialty Exam: Review of Systems  Constitutional: Negative.  Negative for fever and malaise/fatigue.  HENT: Negative.  Negative for congestion and sore throat.   Eyes: Negative.  Negative for blurred vision, double vision, discharge and redness.  Respiratory: Negative.  Negative for cough,  shortness of breath and wheezing.   Cardiovascular: Negative.  Negative for chest pain and palpitations.  Gastrointestinal: Negative for abdominal pain, heartburn, nausea and vomiting.  Neurological: Negative for dizziness, seizures, loss of consciousness, weakness and headaches.  Endo/Heme/Allergies: Negative for environmental allergies.  Psychiatric/Behavioral: Negative for depression, hallucinations, memory loss, substance abuse and suicidal ideas. The patient is not nervous/anxious and does not have insomnia.     Blood pressure 93/72, pulse 98, temperature 98.1 F (36.7 C), temperature source Oral, resp. rate 20, height 6' (1.829 m), weight 81.4 kg (179 lb 8 oz), SpO2 100 %.Body mass index is 24.34 kg/m.  General Appearance: Casual  Eye Contact::  Fair  Speech:  Clear and Coherent and Normal Rate409  Volume:  Normal  Mood:  Euthymic  Affect:  Congruent and Full Range  Thought Process:  Coherent, Goal Directed and Descriptions of Associations: Intact  Orientation:  Full (Time, Place, and Person)  Thought Content:  Logical and Rumination  Suicidal Thoughts:  No  Homicidal Thoughts:  No  Memory:  Immediate;   Fair Recent;   Fair Remote;   Fair  Judgement:  Intact  Insight:  Shallow  Psychomotor Activity:  Normal  Concentration:  Fair  Recall:  [employer] of Knowledge:Fair  Language: Fair  Akathisia:  No  Handed:  Right  AIMS (if indicated):     Assets:  Housing Physical Health Social Support  Sleep:  Number of Hours: 4  Cognition: WNL  ADL's:  Intact   Mental Status Per Nursing Assessment::   On Admission:  NA  Demographic Factors:  Caucasian and Low socioeconomic status  Loss Factors: Decrease in vocational status and Financial problems/change in socioeconomic status  Historical Factors: Family history of mental illness or substance abuse and Impulsivity  Risk Reduction  Factors:   Living with another person, especially a relative  Continued Clinical Symptoms:   Alcohol/Substance Abuse/Dependencies More than one psychiatric diagnosis Previous Psychiatric Diagnoses and Treatments  Cognitive Features That Contribute To Risk:  None    Suicide Risk:  Minimal: No identifiable suicidal ideation.  Patients presenting with no risk factors but with morbid ruminations; may be classified as minimal risk based on the severity of the depressive symptoms  Follow-up Information    [facility] Follow up on 01/19/2017.   Why:  Tuesday at 12:30 for your hospital follow up appointment Contact information: [address] [place] [address] [phone]        Revelo, Presley Raddle, MD Follow up on 02/02/2017.   Specialty:  Family Medicine Why:  Tuesday at 8:40AM.  Bring ID and MCD card Contact information: [address] [place] [address] [phone]           Plan Of Care/Follow-up recommendations:  Activity:  As tolerated Diet:  Regular Other:  Keep follow-up appointments and take medications as prescribed  Nelly Rout, MD 01/15/2017, 7:30 AM

## 2017-01-15 NOTE — [place] FL2 (Signed)
[place] MEDICAID FL2 LEVEL OF CARE SCREENING TOOL     IDENTIFICATION  Patient Name: Tony Cunningham Birthdate: 1976-04-15 Sex: male Admission Date (Current Location): 12/22/2016  [place] and [place] Number:  [name] [id number] [name] MCD; Medicare [id number] Facility and Address:   [facility]  [address]  [place] [address])      Provider Number: [id number]  Attending Physician Name and Address:  Jomarie Longs, MD  Relative Name and Phone Number:  [name], mother.  [phone]    Current Level of Care: Hospital Recommended Level of Care: [facility], Assisted Living Facility Prior Approval Number:    Date Approved/Denied:   PASRR Number: [id number]  Discharge Plan: [place] [facility]       Current Diagnoses: Patient Active Problem List   Diagnosis Date Noted  . 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 Schizoaffective disorder, bipolar type (HCC) 12/22/2016  . Schizoaffective disorder, depressive type (HCC) 09/23/2016  . Polysubstance abuse 07/17/2016  . Tardive dyskinesia 07/16/2016  . Tobacco use disorder 07/07/2016  . Dyslipidemia 07/07/2016  . Asthma 07/07/2016  . HTN (hypertension) 07/06/2016  . Diabetes (HCC) 12/25/2010    Orientation RESPIRATION BLADDER Height & Weight     Self, Time, Situation, Place  Normal Continent Weight: 179 lb 8 oz (81.4 kg) Height:  6' (182.9 cm)  BEHAVIORAL SYMPTOMS/MOOD NEUROLOGICAL BOWEL NUTRITION STATUS     (None) Continent Diet (Diabetic diet)  AMBULATORY STATUS COMMUNICATION OF NEEDS Skin   Independent Verbally Normal                       Personal Care Assistance Level of Assistance  Bathing, Feeding, Dressing Bathing Assistance: Limited assistance Feeding assistance: Independent Dressing Assistance: Limited assistance     Functional Limitations Info  Sight, Hearing, Speech Sight Info: Adequate Hearing Info: Adequate Speech Info: Adequate     SPECIAL CARE FACTORS FREQUENCY  Diabetic urine testing Blood Pressure Frequency: has hypertension Diabetic Urine Testing Frequency: SEE MAR; sliding scale insulin required                Contractures Contractures Info: Not present    Additional Factors Info  Allergies  Clozaril  Allergies Info: no known allergies    Labs required every other week; next due 01/26/17       Current Medications (01/15/2017):  This is the current hospital active medication list Current Medication List-Note: Take only these medications. Stop taking all medications not included on this list. If you have any questions regarding medications not on this list, please follow up with your healthcare provider. Bring this list to your next appointment.  Current Medication List-Note: Take only these medications. Stop taking all medications not included on this list. If you have any questions regarding medications not on this list, please follow up with your healthcare provider. Bring this list to your next appointment.    Morning Afternoon Evening Bedtime As Needed   amantadine 100 MG capsule  Commonly known as: SYMMETREL  Take 1 capsule (100 mg total) by mouth 2 (two) times daily. For prevention of drug induced tremors  For: Extrapyramidal Reaction caused by Medications  Next does due:               benztropine 1 MG tablet  Commonly known as: COGENTIN  Take 1 tablet (1 mg total) by mouth 2 (two) times daily. For prevention of drug induced tremors  For: Extrapyramidal Reaction caused by Medications  Next does due:  citalopram 20 MG tablet  Commonly known as: CELEXA  Take 1 tablet (20 mg total) by mouth daily. For depression  For: Depression  Next does due:               clozapine 50 MG tablet  Commonly known as: CLOZARIL  Take 3 tablets (150 mg total) by mouth at bedtime. For mood control  What changed:  0 medication strength  0 how much to take  0 additional  instructions  For: Schizoaffective Disorder  Next does due:               cyclobenzaprine 5 MG tablet  Commonly known as: FLEXERIL  Take 1 tablet (5 mg total) by mouth 3 (three) times daily as needed for muscle spasms.  For: Muscle Spasm  Next does due:               diclofenac sodium 1 % Gel  Commonly known as: VOLTAREN  Apply 2 g topically 4 (four) times daily. For arthritis pain  For: Joint Damage causing Pain and Loss of Function  Next does due:               * insulin aspart 100 UNIT/ML injection  Commonly known as: novoLOG  Inject 8 Units into the skin 3 (three) times daily with meals. For diabetes management  What changed:  0 how much to take  0 additional instructions  For: Type 2 Diabetes  Next does due:               * insulin aspart 100 UNIT/ML injection  Commonly known as: novoLOG  Inject 6 Units into the skin 3 (three) times daily with meals.  What changed: You were already taking a medication with the same name, and this prescription was added. Make sure you understand how and when to take each.  For: Type 2 Diabetes  Next does due:               * insulin glargine 100 UNIT/ML injection  Commonly known as: LANTUS  Inject 0.45 mLs (45 Units total) into the skin at bedtime. For diabetic management  What changed:  0 how much to take  0 additional instructions  For: Type 2 Diabetes  Next does due:               * insulin glargine 100 UNIT/ML injection  Commonly known as: LANTUS  Inject 0.4 mLs (40 Units total) into the skin at bedtime.  What changed: You were already taking a medication with the same name, and this prescription was added. Make sure you understand how and when to take each.  For: Type 2 Diabetes  Next does due:               lisinopril 20 MG tablet  Commonly known as: PRINIVIL,ZESTRIL  Take 1 tablet (20 mg total) by mouth daily. For high blood pressure  What changed: additional instructions  For: High Blood  Pressure Disorder  Next does due:               metFORMIN 1000 MG tablet  Commonly known as: GLUCOPHAGE  Take 1 tablet (1,000 mg total) by mouth 2 (two) times daily with a meal. For diabetes management  What changed: additional instructions  For: Type 2 Diabetes  Next does due:               montelukast 10 MG tablet  Commonly known as: SINGULAIR  Take 1 tablet (  10 mg total) by mouth at bedtime. For shortness of breath  What changed: additional instructions  For: Asthma  Next does due:               nicotine polacrilex 2 MG gum  Commonly known as: NICORETTE  Take 1 each (2 mg total) by mouth as needed for smoking cessation.  For: Nicotine Addiction  Next does due:               pantoprazole 40 MG tablet  Commonly known as: PROTONIX  Take 1 tablet (40 mg total) by mouth daily. For acid reflux  What changed: additional instructions  For: Gastroesophageal Reflux Disease  Next does due:               polyethylene glycol packet  Commonly known as: MIRALAX / GLYCOLAX  Take 17 g by mouth daily. For constipation  What changed: additional instructions  For: Constipation  Next does due:               propranolol 20 MG tablet  Commonly known as: INDERAL  Take 1 tablet (20 mg total) by mouth 2 (two) times daily. For high blood pressure  What changed: additional instructions  For: High Blood Pressure Disorder  Next does due:               simvastatin 20 MG tablet  Commonly known as: ZOCOR  Take 1 tablet (20 mg total) by mouth daily at 6 PM. For high Cholesterol  What changed:  0 medication strength  0 how much to take  0 additional instructions  For: High Amount of Triglycerides in the Blood  Next does due:               temazepam 7.5 MG capsule  Commonly known as: RESTORIL  Take 1 capsule (7.5 mg total) by mouth at bedtime. For sleep  What changed:  0 medication strength  0 how much to take  0 additional instructions  For: Trouble  Sleeping  Next does due:               Vitamin D3 400 units tablet  Take 1 tablet (400 Units total) by mouth daily. For bone health  For: [facility] health            Discharge Medications: Please see discharge summary for a list of discharge medications.  Relevant Imaging Results:  Relevant Lab Results:   Additional Information Patient also has asthma and is on psychotropics. SSN [id number]  Ida Rogue, LCSW

## 2017-01-15 NOTE — Progress Notes (Signed)
Hypoglycemic Event  CBG: 51  Treatment: 15 GM carbohydrate snack  Symptoms: None  Follow-up CBG: Time: 0650 CBG Result: 165  Possible Reasons for Event: Unknown  Comments/MD notified: Pt h/o uncontrolled CBG. Baron Hamper NP notified     Eveline Keto

## 2017-01-15 NOTE — Discharge Summary (Signed)
Physician Discharge Summary Note  Patient:  Tony Cunningham is an 41 y.o., male MRN:  [id number] DOB:  09-11-1975  Patient phone:  [phone] (home)   Patient address:   [address] [place] [address],   Total Time spent with patient: Greater than 30 minutes  Date of Admission:  12/22/2016  Date of Discharge: 01-15-17  Reason for Admission: Worsening symptoms of depression & suicidal thoughts with plans to overdose.   This is an admission assessment for this 41 year old Caucasian male with history of mental illness, chronic. He is an established mental health patient at the [facility] in [place], [place] in [place] under the care of Dr. Gerre Pebbles for ECT. He is admitted to the [facility] adult unit from the [facility] with complaints of worsening symptoms of depression with suicidal ideations & plans to overdose on medications. Patient was apparently accompanied to the ED by his peer support specialist. Chart review indicated that patient was receiving outpatient psychiatric services at the [facility] clinic in [place], [place]. Report also indicated that he has been out of his mental health medications for at least 3 days. During this assessment, Nikash seen out of it. He is lying down in his bed, sleeping. He is alert & easily aroused. He is verbally responsive, however, his speech is very slow & incoherent at times. He is currently a poor historian. He reports, "I have been at the [facility] x 2 weeks. I got discharged on Saturday. When I got home, my depression became worse. He brought me to the hospital because of my behavior. I needed help because I have been suicidal x 5 days". Principal Problem: Schizoaffective disorder, bipolar type [facility])  Discharge Diagnoses: Patient Active Problem List   Diagnosis Date Noted  . 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 Schizoaffective disorder, bipolar type (HCC) [F25.0] 12/22/2016  . Schizoaffective disorder, depressive type (HCC)  [F25.1] 09/23/2016  . Polysubstance abuse [F19.10] 07/17/2016  . Tardive dyskinesia [G24.01] 07/16/2016  . Tobacco use disorder [F17.200] 07/07/2016  . Dyslipidemia [E78.5] 07/07/2016  . Asthma [J45.909] 07/07/2016  . HTN (hypertension) [I10] 07/06/2016  . Diabetes (HCC) [E11.9] 12/25/2010   Past Psychiatric History: Major depression  Past Medical History:  Past Medical History:  Diagnosis Date  . Anxiety   . Asthma   . Diabetes mellitus   . High blood pressure   . Sinus complaint    History reviewed. No pertinent surgical history.  Family History: History reviewed. No pertinent family history.  Family Psychiatric  History: See H&P  Social History:  History  Alcohol Use No     History  Drug Use No    Social History   Social History  . Marital status: Single    Spouse name: [name]  . Number of children: [name]  . Years of education: [name]   Social History Main Topics  . Smoking status: Current Every Day Smoker    Packs/day: 0.50    Types: Cigarettes  . Smokeless tobacco: Never Used  . Alcohol use No  . Drug use: No  . Sexual activity: Not Currently   Other Topics Concern  . None   Social History Narrative  . None   Hospital Course: This is an admission assessment for this 41 year old Caucasian male with history of mental illness, chronic. He is an established mental health patient at the [facility] in [place], [place] in [place] under the care of Dr. Gerre Pebbles for ECT. He is admitted to the  [facility] adult unit from the [facility] with complaints of worsening symptoms of depression with suicidal ideations & plans to overdose on medications. Patient was apparently accompanied to the ED by his peer support specialist. Chart review indicated that patient was receiving outpatient psychiatric services at the [facility] clinic in [place], [place]. Report also indicated that he has been out of his mental health medications for at least 3 days. During this assessment, [name] seem out  of it. He is lying down in his bed, sleeping. He is alert & easily aroused. He is verbally responsive, however, his speech is very slow & incoherent at times. He is currently a poor historian. He reports, "I have been at the [facility] x 2 weeks. I got discharged on Saturday. When I got home, my depression became worse. He brought me to the hospital because of my behavior. I needed help because I have been suicidal x 5 days".  [name] was admitted to the [facility] adult unit with complaints of worsening symptoms of depression triggering suicidal ideations with plans to overdose on medications. He did not cite any related stressors as the trigger. He does have hx of chronic mental illness. He has tried several psychotropic medications for his mental health issues, yet not improvement has been noted as of yet. He recently completed ECT at the [facility] under the care of Dr. Gerre Pebbles. Per record review, his last ECT was on 12-07-16. Shin was in need of mood stabilization treatments.   During the course of his hospitalization, [name] received & was  discharged on, Amantadine 100 mg for parkinsonian tremors, Benztropine 1 mg for EPS, Nicorette gum  2 mg for smoking cessation, Citalopram 20 mg for depression, Clozaril 150 mg for mood control & Restoril 7.5 mg for insomnia. He was enrolled & seldom participated in the group counseling sessions being offered & held on this unit. This is to learn coping skills that should help him cope better & maintain mood stability after discharge. He was resumed on all his pertinent home medications for the other previously existing medical issues that he presented. He tolerated his treatment regimen without any adverse effects or reactions reported.   While his treatment was on going, [name] was assessed on daily basis by the clinicians to assure that his symptoms were responding to his treatment regimen. It took quite some time & some medication dose changes to get his symptoms to subside. His  improvement was noted by observation & his daily reports of gradual symptom reduction. He was evaluated daily by the treatment team for mood stability & the need for continued recovery after discharge. He was offered further treatment option on an outpatient basis as noted below. And because of the chronic nature of his mental health conditions, limited ability to care for himself & the problem with medication compliance, Hudsen is now placed in a group home setting upon discharge. This is to assure continuity of his mental health, medical health & self care after discharge from here on.      Upon discharge, Blease was both mentally & medically stable. He is currently denying suicidal, homicidal ideation, auditory, visual/tactile hallucinations, delusional thoughts & or paranoia. He presents alert, attentive, improved grooming. He is calm & cooperative. Speech is limited and answers most questions with monosyllables or head gestures. Of note, when RN and CSW he is familiar with came into the room, he brightened considerably, smiled and became more verbal. Reports mood as "OK" , affect is blunted & yet, reactive, currently  denies hallucinations. Does not appear internally preoccupied . No delusions are expressed at this time, denies suicidal or self injurious ideations, denies homicidal or violent ideations. No disruptive or overtly agitated behaviors on unit. FS improved to 205. PPD was read several days ago- reported as negative. Orvin left [facility] with all personal belongings in no apparent distress. Transportation per his group home staff.  Physical Findings: AIMS: Facial and Oral Movements Muscles of Facial Expression: None, normal Lips and Perioral Area: None, normal Jaw: None, normal Tongue: None, normal,Extremity Movements Upper (arms, wrists, hands, fingers): None, normal Lower (legs, knees, ankles, toes): None, normal, Trunk Movements Neck, shoulders, hips: None, normal, Overall Severity Severity  of abnormal movements (highest score from questions above): None, normal Incapacitation due to abnormal movements: None, normal Patient's awareness of abnormal movements (rate only patient's report): No Awareness, Dental Status Current problems with teeth and/or dentures?: No Does patient usually wear dentures?: No  CIWA:  CIWA-Ar Total: 4 COWS:  COWS Total Score: 4  Musculoskeletal: Strength & Muscle Tone: within normal limits Gait & Station: normal Patient leans: [name]  Psychiatric Specialty Exam: Physical Exam  Constitutional: He appears well-developed.  HENT:  Head: Normocephalic.  Eyes: Pupils are equal, round, and reactive to light.  Neck: Normal range of motion.  Cardiovascular: Normal rate.   Respiratory: Effort normal.  GI: Soft.  Genitourinary:  Genitourinary Comments: Deferred  Musculoskeletal: Normal range of motion.  Neurological: He is alert.  Skin: Skin is warm.    Review of Systems  Constitutional: Negative.   HENT: Negative.   Eyes: Negative.   Respiratory: Negative.   Cardiovascular: Negative.   Gastrointestinal: Negative.   Genitourinary: Negative.   Musculoskeletal: Negative.   Skin: Negative.   Neurological: Negative.   Endo/Heme/Allergies: Negative.   Psychiatric/Behavioral: Positive for depression (Stable ) and hallucinations (Hx. psychosis). Negative for memory loss, substance abuse and suicidal ideas. The patient has insomnia (Stable). The patient is not nervous/anxious.     Blood pressure 140/81, pulse 79, temperature 97.9 F (36.6 C), temperature source Oral, resp. rate 16, height 6' (1.829 m), weight 81.4 kg (179 lb 8 oz), SpO2 100 %.Body mass index is 24.34 kg/m.  See Md's SRA   Have you used any form of tobacco in the last 30 days? (Cigarettes, Smokeless Tobacco, Cigars, and/or Pipes): Yes  Has this patient used any form of tobacco in the last 30 days? (Cigarettes, Smokeless Tobacco, Cigars, and/or Pipes):Yes, provided with nicotine patch  prescription.  Blood Alcohol level:  Lab Results  Component Value Date   ETH <5 12/21/2016   ETH <5 10/21/2016   Metabolic Disorder Labs:  Lab Results  Component Value Date   HGBA1C 10.3 (H) 11/09/2016   MPG 249 11/09/2016   MPG 275 10/09/2016   Lab Results  Component Value Date   PROLACTIN 24.5 (H) 09/24/2016   PROLACTIN 3.4 (L) 07/07/2016   Lab Results  Component Value Date   CHOL 129 12/28/2016   TRIG 78 12/28/2016   HDL 54 12/28/2016   CHOLHDL 2.4 12/28/2016   VLDL 16 12/28/2016   LDLCALC 59 12/28/2016   LDLCALC 42 11/09/2016   See Psychiatric Specialty Exam and Suicide Risk Assessment completed by Attending Physician prior to discharge.  Discharge destination:  Other:  [facility]  Is patient on multiple antipsychotic therapies at discharge:  No   Has Patient had three or more failed trials of antipsychotic monotherapy by history:  No  Recommended Plan for Multiple Antipsychotic Therapies: NA Discharge Instructions  Diet - low sodium heart healthy    Complete by:  As directed      Allergies as of 01/15/2017   No Known Allergies     Medication List    STOP taking these medications   fluvoxaMINE 100 MG tablet Commonly known as:  LUVOX   meloxicam 7.5 MG tablet Commonly known as:  MOBIC   nicotine 7 mg/24hr patch Commonly known as:  NICODERM CQ - dosed in mg/24 hr     TAKE these medications     Indication  amantadine 100 MG capsule Commonly known as:  SYMMETREL Take 1 capsule (100 mg total) by mouth 2 (two) times daily. For prevention of drug induced tremors  Indication:  Extrapyramidal Reaction caused by Medications   benztropine 1 MG tablet Commonly known as:  COGENTIN Take 1 tablet (1 mg total) by mouth 2 (two) times daily. For prevention of drug induced tremors  Indication:  Extrapyramidal Reaction caused by Medications   citalopram 20 MG tablet Commonly known as:  CELEXA Take 1 tablet (20 mg total) by mouth daily. For depression   Indication:  Depression   clozapine 50 MG tablet Commonly known as:  CLOZARIL Take 3 tablets (150 mg total) by mouth at bedtime. For mood control What changed:  medication strength  how much to take  additional instructions  Indication:  Schizoaffective Disorder   cyclobenzaprine 5 MG tablet Commonly known as:  FLEXERIL Take 1 tablet (5 mg total) by mouth 3 (three) times daily as needed for muscle spasms.  Indication:  Muscle Spasm   diclofenac sodium 1 % Gel Commonly known as:  VOLTAREN Apply 2 g topically 4 (four) times daily. For arthritis pain  Indication:  Joint Damage causing Pain and Loss of Function   insulin aspart 100 UNIT/ML injection Commonly known as:  novoLOG Inject 8 Units into the skin 3 (three) times daily with meals. For diabetes management What changed:  how much to take  additional instructions  Indication:  Type 2 Diabetes   insulin aspart 100 UNIT/ML injection Commonly known as:  novoLOG Inject 6 Units into the skin 3 (three) times daily with meals. What changed:  You were already taking a medication with the same name, and this prescription was added. Make sure you understand how and when to take each.  Indication:  Type 2 Diabetes   insulin glargine 100 UNIT/ML injection Commonly known as:  LANTUS Inject 0.45 mLs (45 Units total) into the skin at bedtime. For diabetic management What changed:  how much to take  additional instructions  Indication:  Type 2 Diabetes   insulin glargine 100 UNIT/ML injection Commonly known as:  LANTUS Inject 0.4 mLs (40 Units total) into the skin at bedtime. What changed:  You were already taking a medication with the same name, and this prescription was added. Make sure you understand how and when to take each.  Indication:  Type 2 Diabetes   lisinopril 20 MG tablet Commonly known as:  PRINIVIL,ZESTRIL Take 1 tablet (20 mg total) by mouth daily. For high blood pressure What changed:  additional  instructions  Indication:  High Blood Pressure Disorder   metFORMIN 1000 MG tablet Commonly known as:  GLUCOPHAGE Take 1 tablet (1,000 mg total) by mouth 2 (two) times daily with a meal. For diabetes management What changed:  additional instructions  Indication:  Type 2 Diabetes   montelukast 10 MG tablet Commonly known as:  SINGULAIR Take 1 tablet (10 mg total) by mouth at bedtime. For  shortness of breath What changed:  additional instructions  Indication:  Asthma   nicotine polacrilex 2 MG gum Commonly known as:  NICORETTE Take 1 each (2 mg total) by mouth as needed for smoking cessation.  Indication:  Nicotine Addiction   pantoprazole 40 MG tablet Commonly known as:  PROTONIX Take 1 tablet (40 mg total) by mouth daily. For acid reflux What changed:  additional instructions  Indication:  Gastroesophageal Reflux Disease   polyethylene glycol packet Commonly known as:  MIRALAX / GLYCOLAX Take 17 g by mouth daily. For constipation What changed:  additional instructions  Indication:  Constipation   propranolol 20 MG tablet Commonly known as:  INDERAL Take 1 tablet (20 mg total) by mouth 2 (two) times daily. For high blood pressure What changed:  additional instructions  Indication:  High Blood Pressure Disorder   simvastatin 20 MG tablet Commonly known as:  ZOCOR Take 1 tablet (20 mg total) by mouth daily at 6 PM. For high Cholesterol What changed:  medication strength  how much to take  additional instructions  Indication:  High Amount of Triglycerides in the Blood   temazepam 7.5 MG capsule Commonly known as:  RESTORIL Take 1 capsule (7.5 mg total) by mouth at bedtime. For sleep What changed:  medication strength  how much to take  additional instructions  Indication:  Trouble Sleeping   Vitamin D3 400 units tablet Take 1 tablet (400 Units total) by mouth daily. For bone health  Indication:  [facility]      Follow-up Information    [facility] Follow up on 01/19/2017.   Why:  Tuesday at 12:30 for your hospital follow up appointment Contact information: [address] [place] [address] [phone]        Revelo, Presley Raddle, MD Follow up on 02/02/2017.   Specialty:  Family Medicine Why:  Tuesday at 8:40AM.  Bring ID and MCD card Contact information: [address] [place] [address] [phone]          Follow-up recommendations: Activity:  As tolerated Diet: As recommended by your primary care doctor. Keep all scheduled follow-up appointments as recommended.  Comments: Patient is instructed prior to discharge to: Take all medications as prescribed by his/her mental healthcare provider. Report any adverse effects and or reactions from the medicines to his/her outpatient provider promptly. Patient has been instructed & cautioned: To not engage in alcohol and or illegal drug use while on prescription medicines. In the event of worsening symptoms, patient is instructed to call the crisis hotline, 911 and or go to the nearest ED for appropriate evaluation and treatment of symptoms. To follow-up with his/her primary care provider for your other medical issues, concerns and or health care needs.   Signed: Truman Hayward, FNP, PMHNP, FNP-BC 01/15/2017, 9:07 AM  Patient seen face-to-face for the psychiatric evaluation, reviewed available medical records, case discussed with the treatment team, completed suicide risk assessment and formulated safe disposition plan. Reviewed the information documented and agree with the treatment plan.   S[facility] 01/15/2017 4:13 PM

## 2017-01-15 NOTE — Progress Notes (Signed)
DAR Note: Pt observed on the hallway pleasant and interacting with staffs. Pt at the time endorsed moderate anxiety; states, "I will be going home tomorrow and I am a little anxious." Pt also complained of moderate neck pain. Pt denied SI, HI or AVH. Pt was med compliant. All patient's questions and concerns addressed. Support, encouragement, and safe environment provided.

## 2017-01-15 NOTE — Progress Notes (Signed)
  [facility] Adult Case Management Discharge Plan :  Will you be returning to the same living situation after discharge:  No. [place] [facility] At discharge, do you have transportation home?: Yes,  Peer support specialist Do you have the ability to pay for your medications: Yes,  MCD  Release of information consent forms completed and in the chart;  Patient's signature needed at discharge.  Patient to Follow up at: Follow-up Information    [facility] Follow up on 01/19/2017.   Why:  Tuesday at 12:30 for your hospital follow up appointment Contact information: [address] [place] [address] [phone]        Revelo, Presley Raddle, MD Follow up on 02/02/2017.   Specialty:  Family Medicine Why:  Tuesday at 8:40AM.  Bring ID and MCD card Contact information: [address] [place] [address] [place] [address] [phone]           Next level of care provider has access to [facility] Link:No   Safety Planning and Suicide Prevention discussed: Yes,  yes  Have you used any form of tobacco in the last 30 days? (Cigarettes, Smokeless Tobacco, Cigars, and/or Pipes): Yes  Has patient been referred to the Quitline?: Patient refused referral  Patient has been referred for addiction treatment: [name]  Ida Rogue, LCSW 01/15/2017, 10:23 AM

## 2017-01-15 NOTE — Progress Notes (Signed)
Patient to discharged to group home staff. All discharge paperwork given and signed, belongings returned. Prescriptions given. Patient able to verbalize understanding. Patient stable, denies SI/HI/AVH. Apprehensive about discharging to a new place where he knows no one. Encouragement given, patient told that he is well liked here and he will be well liked at the new group home. Smiled and looked more relaxed.

## 2017-01-16 ENCOUNTER — Emergency Department
Admission: EM | Admit: 2017-01-16 | Discharge: 2017-01-19 | Disposition: A | Discharge status: Other Acute Inpatient Hospital | Payer: Medicare Other | Attending: Emergency Medicine | Admitting: Emergency Medicine

## 2017-01-16 ENCOUNTER — Encounter: Payer: Self-pay | Admitting: Emergency Medicine

## 2017-01-16 DIAGNOSIS — F259 Schizoaffective disorder, unspecified: Secondary | ICD-10-CM | POA: Insufficient documentation

## 2017-01-16 DIAGNOSIS — J45909 Unspecified asthma, uncomplicated: Secondary | ICD-10-CM | POA: Insufficient documentation

## 2017-01-16 DIAGNOSIS — Z794 Long term (current) use of insulin: Secondary | ICD-10-CM | POA: Diagnosis not present

## 2017-01-16 DIAGNOSIS — R45851 Suicidal ideations: Secondary | ICD-10-CM

## 2017-01-16 DIAGNOSIS — Z79899 Other long term (current) drug therapy: Secondary | ICD-10-CM | POA: Diagnosis not present

## 2017-01-16 DIAGNOSIS — I10 Essential (primary) hypertension: Secondary | ICD-10-CM | POA: Diagnosis not present

## 2017-01-16 DIAGNOSIS — E119 Type 2 diabetes mellitus without complications: Secondary | ICD-10-CM

## 2017-01-16 DIAGNOSIS — F250 Schizoaffective disorder, bipolar type: Secondary | ICD-10-CM | POA: Diagnosis not present

## 2017-01-16 DIAGNOSIS — F17210 Nicotine dependence, cigarettes, uncomplicated: Secondary | ICD-10-CM | POA: Diagnosis not present

## 2017-01-16 LAB — CBC
HCT: 43.7 % (ref 40.0–52.0)
Hemoglobin: 15.3 g/dL (ref 13.0–18.0)
MCH: 31.6 pg (ref 26.0–34.0)
MCHC: 35 g/dL (ref 32.0–36.0)
MCV: 90.2 fL (ref 80.0–100.0)
PLATELETS: 182 10*3/uL (ref 150–440)
RBC: 4.85 MIL/uL (ref 4.40–5.90)
RDW: 13.4 % (ref 11.5–14.5)
WBC: 13.3 10*3/uL — ABNORMAL HIGH (ref 3.8–10.6)

## 2017-01-16 LAB — DIFFERENTIAL
BASOS ABS: 0.1 10*3/uL (ref 0–0.1)
BASOS PCT: 1 %
EOS ABS: 0.3 10*3/uL (ref 0–0.7)
EOS PCT: 2 %
Lymphocytes Relative: 17 %
Lymphs Abs: 2.3 10*3/uL (ref 1.0–3.6)
MONO ABS: 0.7 10*3/uL (ref 0.2–1.0)
Monocytes Relative: 5 %
NEUTROS ABS: 9.9 10*3/uL — AB (ref 1.4–6.5)
Neutrophils Relative %: 75 %

## 2017-01-16 LAB — COMPREHENSIVE METABOLIC PANEL
ALBUMIN: 4.2 g/dL (ref 3.5–5.0)
ALK PHOS: 64 U/L (ref 38–126)
ALT: 19 U/L (ref 17–63)
ANION GAP: 10 (ref 5–15)
AST: 17 U/L (ref 15–41)
BILIRUBIN TOTAL: 0.9 mg/dL (ref 0.3–1.2)
BUN: 8 mg/dL (ref 6–20)
CHLORIDE: 96 mmol/L — AB (ref 101–111)
CO2: 30 mmol/L (ref 22–32)
CREATININE: 0.73 mg/dL (ref 0.61–1.24)
Calcium: 9 mg/dL (ref 8.9–10.3)
GFR calc Af Amer: >60 mL/min (ref >60)
GFR calc non Af Amer: >60 mL/min (ref >60)
Glucose, Bld: 270 mg/dL — ABNORMAL HIGH (ref 65–99)
POTASSIUM: 4.1 mmol/L (ref 3.5–5.1)
Sodium: 136 mmol/L (ref 135–145)
TOTAL PROTEIN: 7 g/dL (ref 6.5–8.1)

## 2017-01-16 LAB — URINE DRUG SCREEN, QUALITATIVE (ARMC ONLY)
Amphetamines, Ur Screen: NOT DETECTED
BARBITURATES, UR SCREEN: NOT DETECTED
BENZODIAZEPINE, UR SCRN: POSITIVE — AB
CANNABINOID 50 NG, UR ~~LOC~~: NOT DETECTED
Cocaine Metabolite,Ur ~~LOC~~: NOT DETECTED
MDMA (ECSTASY) UR SCREEN: NOT DETECTED
Methadone Scn, Ur: NOT DETECTED
Opiate, Ur Screen: NOT DETECTED
PHENCYCLIDINE (PCP) UR S: NOT DETECTED
TRICYCLIC, UR SCREEN: NOT DETECTED

## 2017-01-16 LAB — ETHANOL

## 2017-01-16 LAB — ACETAMINOPHEN LEVEL: Acetaminophen (Tylenol), Serum: <10 ug/mL — ABNORMAL LOW (ref 10–30)

## 2017-01-16 LAB — SALICYLATE LEVEL

## 2017-01-16 LAB — GLUCOSE, CAPILLARY
Glucose-Capillary: 412 mg/dL — ABNORMAL HIGH (ref 65–99)
Glucose-Capillary: 359 mg/dL — ABNORMAL HIGH (ref 65–99)

## 2017-01-16 MED ORDER — INSULIN ASPART 100 UNIT/ML ~~LOC~~ SOLN: 6.0000 [IU] | Freq: Three times a day (TID) | SUBCUTANEOUS | Status: DC

## 2017-01-16 MED ORDER — INSULIN GLARGINE 100 UNIT/ML ~~LOC~~ SOLN
40.0000 [IU] | Freq: Every day | SUBCUTANEOUS | Status: DC
  Administered 2017-01-16 – 2017-01-18 (×3): 40 [IU] via SUBCUTANEOUS
  Filled 2017-01-16 (×3): qty 0.4

## 2017-01-16 MED ORDER — BENZTROPINE MESYLATE 1 MG PO TABS
1.0000 mg | ORAL_TABLET | Freq: Two times a day (BID) | ORAL | Status: DC
  Administered 2017-01-17 – 2017-01-18 (×4): 1 mg via ORAL
  Filled 2017-01-16 (×3): qty 1

## 2017-01-16 MED ORDER — INSULIN ASPART 100 UNIT/ML ~~LOC~~ SOLN
SUBCUTANEOUS | Status: AC
  Administered 2017-01-16: 8 [IU] via SUBCUTANEOUS
  Filled 2017-01-16: qty 1

## 2017-01-16 MED ORDER — CLOZAPINE 100 MG PO TABS
150.0000 mg | ORAL_TABLET | Freq: Every day | ORAL | Status: DC
  Administered 2017-01-16 – 2017-01-18 (×3): 150 mg via ORAL
  Filled 2017-01-16 (×2): qty 2
  Filled 2017-01-16: qty 6
  Filled 2017-01-16: qty 2

## 2017-01-16 MED ORDER — CITALOPRAM HYDROBROMIDE 20 MG PO TABS
20.0000 mg | ORAL_TABLET | Freq: Every day | ORAL | Status: DC
  Administered 2017-01-17 – 2017-01-18 (×2): 20 mg via ORAL
  Filled 2017-01-16 (×2): qty 1

## 2017-01-16 MED ORDER — PROPRANOLOL HCL 10 MG PO TABS
20.0000 mg | ORAL_TABLET | Freq: Two times a day (BID) | ORAL | Status: DC
  Administered 2017-01-16 – 2017-01-18 (×5): 20 mg via ORAL
  Filled 2017-01-16 (×5): qty 2

## 2017-01-16 MED ORDER — MONTELUKAST SODIUM 10 MG PO TABS
10.0000 mg | ORAL_TABLET | Freq: Every day | ORAL | Status: DC
  Administered 2017-01-16 – 2017-01-18 (×3): 10 mg via ORAL
  Filled 2017-01-16 (×3): qty 1

## 2017-01-16 MED ORDER — TEMAZEPAM 7.5 MG PO CAPS
7.5000 mg | ORAL_CAPSULE | Freq: Every day | ORAL | Status: DC
Start: 2017-01-16 — End: 2017-01-19
  Administered 2017-01-16 – 2017-01-18 (×3): 7.5 mg via ORAL
  Filled 2017-01-16 (×3): qty 1

## 2017-01-16 MED ORDER — INSULIN ASPART 100 UNIT/ML ~~LOC~~ SOLN
8.0000 [IU] | Freq: Once | SUBCUTANEOUS | Status: AC
  Administered 2017-01-16: 8 [IU] via SUBCUTANEOUS

## 2017-01-16 MED ORDER — CLOZAPINE 100 MG PO TABS: 150.0000 mg | ORAL_TABLET | Freq: Every day | ORAL | Status: DC

## 2017-01-16 MED ORDER — LISINOPRIL 20 MG PO TABS
20.0000 mg | ORAL_TABLET | Freq: Every day | ORAL | Status: DC
  Administered 2017-01-17 – 2017-01-18 (×2): 20 mg via ORAL
  Filled 2017-01-16 (×2): qty 1

## 2017-01-16 MED ORDER — PANTOPRAZOLE SODIUM 40 MG PO TBEC
40.0000 mg | DELAYED_RELEASE_TABLET | Freq: Every day | ORAL | Status: DC
  Administered 2017-01-17 – 2017-01-18 (×2): 40 mg via ORAL
  Filled 2017-01-16 (×2): qty 1

## 2017-01-16 MED ORDER — AMANTADINE HCL 100 MG PO CAPS: 100.0000 mg | ORAL_CAPSULE | Freq: Two times a day (BID) | ORAL | Status: DC

## 2017-01-16 MED ORDER — BENZTROPINE MESYLATE 1 MG PO TABS
1.0000 mg | ORAL_TABLET | Freq: Two times a day (BID) | ORAL | Status: DC
  Administered 2017-01-16 (×2): 1 mg via ORAL
  Filled 2017-01-16 (×4): qty 1

## 2017-01-16 MED ORDER — INSULIN ASPART 100 UNIT/ML ~~LOC~~ SOLN
8.0000 [IU] | Freq: Three times a day (TID) | SUBCUTANEOUS | Status: DC
  Administered 2017-01-17: 8 [IU] via SUBCUTANEOUS
  Filled 2017-01-16 (×2): qty 1

## 2017-01-16 MED ORDER — AMANTADINE HCL 100 MG PO CAPS
100.0000 mg | ORAL_CAPSULE | Freq: Two times a day (BID) | ORAL | Status: DC
  Administered 2017-01-16 – 2017-01-18 (×6): 100 mg via ORAL
  Filled 2017-01-16 (×8): qty 1

## 2017-01-16 MED ORDER — CITALOPRAM HYDROBROMIDE 20 MG PO TABS
20.0000 mg | ORAL_TABLET | Freq: Every day | ORAL | Status: DC
  Administered 2017-01-16: 20 mg via ORAL
  Filled 2017-01-16: qty 1

## 2017-01-16 NOTE — Progress Notes (Signed)
Clozapine monitoring  ANC 9.9. Lab reported and patient eligible with weekly monitoring per [employer] website.   Fulton Reek, PharmD, BCPS  01/16/17 11:36 PM

## 2017-01-16 NOTE — ED Notes (Signed)
BEHAVIORAL HEALTH ROUNDING  Patient sleeping: No.  Patient alert and oriented: yes  Behavior appropriate: Yes. ; If no, describe:  Nutrition and fluids offered: Yes  Toileting and hygiene offered: Yes  Sitter present: not applicable, Q 15 min safety rounds and observation.  Law enforcement present: Yes ODS  

## 2017-01-16 NOTE — BH Assessment (Signed)
Referral information for Psychiatric Hospitalization faxed to:  [facility] [phone] or [phone])  [facility] [phone])  [address] [phone])  [facility] [phone])  [facility] [phone])  [facility] [phone])

## 2017-01-16 NOTE — Progress Notes (Signed)
LCSW [phone] [facility]  Called and left message for [place]. Awaiting call back   Story Conti Quinter LCSW [phone]

## 2017-01-16 NOTE — Clinical Social Work Note (Signed)
Clinical Social Work Assessment  Patient Details  Name: Tony Cunningham MRN: [id number] Date of Birth: [birth year]  Date of referral:  01/16/17               Reason for consult:  Mental Health Concerns                Permission sought to share information with:  Family Supports, Facility Medical sales representative Permission granted to share information::  Yes, Verbal Permission Granted  Name::     Mother [phone]  Agency::  [facility]  Relationship::     Contact Information:     Housing/Transportation Living arrangements for the past 2 months:  Group Home, Homeless Source of Information:  Patient, Medical Team Patient Interpreter Needed:  None Criminal Activity/Legal Involvement Pertinent to Current Situation/Hospitalization:  No - Comment as needed Significant Relationships:  Parents Lives with:  Facility Resident Do you feel safe going back to the place where you live?  Yes Need for family participation in patient care:  Yes (Comment)  Care giving concerns:     Social Worker assessment / plan:  LCSW introduced myself to patient he was in hospital at [facility] just yesterday. Patient reports beibg suicidal and homicidal and was laying in the hospital bed " stiff" He had good eye contact but appeared not to be processing what was asked or being stated. He answered just in yes no questions and was flat affect in demeanor. Patient reports he needs the hospital and was only at group home 1 day. He reports he does not want to back and wants to be in hospital. Patient is oriented  x3 but not oriented to time.  Employment status:  Disabled (Comment on whether or not currently receiving Disability) Insurance information:  Medicare, Medicaid [place] PT Recommendations:  Not assessed at this time Information / Referral to community resources:  Other (Comment Required) (Group home)  Patient/Family's Response to care:  TBD  Patient/Family's Understanding of and Emotional Response to  Diagnosis, Current Treatment, and Prognosis:  Patient reports he is scared and needs to be in the hospital  ( appears fearful but unable to express his feelings or needs)  Emotional Assessment Appearance:  Appears stated age Attitude/Demeanor/Rapport:   good eye contact Affect (typically observed):   flat Orientation:  Oriented to Self, Oriented to Place, Oriented to Situation Alcohol / Substance use:  Not Applicable Psych involvement (Current and /or in the community):  Yes (Comment)  Discharge Needs  Concerns to be addressed:  Homelessness Readmission within the last 30 days:  Yes (Aug 24th/18 [facility]) Current discharge risk:  Homeless Barriers to Discharge:  Other   Cheron Schaumann, LCSW 01/16/2017, 1:38 PM

## 2017-01-16 NOTE — ED Notes (Signed)
ENVIRONMENTAL ASSESSMENT  Potentially harmful objects out of patient reach: Yes.  Personal belongings secured: Yes.  Patient dressed in hospital provided attire only: Yes.  Plastic bags out of patient reach: Yes.  Patient care equipment (cords, cables, call bells, lines, and drains) shortened, removed, or accounted for: Yes.  Equipment and supplies removed from bottom of stretcher: Yes.  Potentially toxic materials out of patient reach: Yes.  Sharps container removed or out of patient reach: Yes.   BEHAVIORAL HEALTH ROUNDING  Patient sleeping: No.  Patient alert and oriented: yes  Behavior appropriate: Yes. ; If no, describe:  Nutrition and fluids offered: Yes  Toileting and hygiene offered: Yes  Sitter present: not applicable, Q 15 min safety rounds and observation.  Law enforcement present: Yes ODS  ED BHU PLACEMENT JUSTIFICATION  Is the patient under IVC or is there intent for IVC: Yes.  Is the patient medically cleared: Yes.  Is there vacancy in the ED BHU: Yes.  Is the population mix appropriate for patient: Yes.  Is the patient awaiting placement in inpatient or outpatient setting: Yes.  Has the patient had a psychiatric consult: Yes.  Survey of unit performed for contraband, proper placement and condition of furniture, tampering with fixtures in bathroom, shower, and each patient room: Yes. ; Findings: All clear  APPEARANCE/BEHAVIOR  calm, cooperative and adequate rapport can be established  NEURO ASSESSMENT  Orientation: time, place and person  Hallucinations: yes pt reports hearing voices telling him to walk into traffic to kill himself.  Speech: Normal  Gait: normal  RESPIRATORY ASSESSMENT  WNL  CARDIOVASCULAR ASSESSMENT  WNL  GASTROINTESTINAL ASSESSMENT  WNL  EXTREMITIES  WNL  PLAN OF CARE  Provide calm/safe environment. Vital signs assessed TID. ED BHU Assessment once each 12-hour shift. Collaborate with TTS daily or as condition indicates. Assure the ED  provider has rounded once each shift. Provide and encourage hygiene. Provide redirection as needed. Assess for escalating behavior; address immediately and inform ED provider.  Assess family dynamic and appropriateness for visitation as needed: Yes. ; If necessary, describe findings:  Educate the patient/family about BHU procedures/visitation: Yes. ; If necessary, describe findings: Pt is calm and cooperative at this time. Pt understanding and accepting of unit procedures/rules. Will continue to monitor with Q 15 min safety rounds and observation.

## 2017-01-16 NOTE — ED Triage Notes (Signed)
Pt to ed with c/o suicidal thoughts and hearing voices.  Pt states " the voices are telling me to jump from the car and to hut other people. "

## 2017-01-16 NOTE — ED Provider Notes (Signed)
[facility] Emergency Department Provider Note  ____________________________________________   First MD Initiated Contact with Patient 01/16/17 [phone]     (approximate)  I have reviewed the triage vital signs and the nursing notes.   HISTORY  Chief Complaint Suicidal   HPI Tony Cunningham is a 41 y.o. male who comes to the emergency department with suicidal ideation. He has a long-standing history of schizoaffective disorder and most recently was discharged from inpatient psychiatric hospitalization yesterday. he said that he feels like he was discharged to severe and he hears voices telling him to actively harm himself. Today he ran into the street in an attempt to get ran over however he was stopped by the staff at his group home. His symptoms have been gradual onset has been progressive and nothing seems to make it better or worse.   Past Medical History:  Diagnosis Date  . Anxiety   . Asthma   . Diabetes mellitus   . High blood pressure   . Sinus complaint     Patient Active Problem List   Diagnosis Date Noted  . 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 Schizoaffective disorder, bipolar type (HCC) 12/22/2016  . Schizoaffective disorder, depressive type (HCC) 09/23/2016  . Polysubstance abuse 07/17/2016  . Tardive dyskinesia 07/16/2016  . Tobacco use disorder 07/07/2016  . Dyslipidemia 07/07/2016  . Asthma 07/07/2016  . HTN (hypertension) 07/06/2016  . Diabetes (HCC) 12/25/2010    History reviewed. No pertinent surgical history.  Prior to Admission medications   Medication Sig Start Date End Date Taking? Authorizing Provider  amantadine (SYMMETREL) 100 MG capsule Take 1 capsule (100 mg total) by mouth 2 (two) times daily. For prevention of drug induced tremors 01/08/17   Armandina Stammer I, NP  benztropine (COGENTIN) 1 MG tablet Take 1 tablet (1 mg total) by mouth 2 (two) times daily. For prevention of drug induced tremors 01/08/17    Armandina Stammer I, NP  cholecalciferol 400 units tablet Take 1 tablet (400 Units total) by mouth daily. For bone health 01/08/17   Armandina Stammer I, NP  citalopram (CELEXA) 20 MG tablet Take 1 tablet (20 mg total) by mouth daily. For depression 01/08/17   Armandina Stammer I, NP  cloZAPine (CLOZARIL) 50 MG tablet Take 3 tablets (150 mg total) by mouth at bedtime. For mood control 01/08/17   Armandina Stammer I, NP  cyclobenzaprine (FLEXERIL) 5 MG tablet Take 1 tablet (5 mg total) by mouth 3 (three) times daily as needed for muscle spasms. 01/08/17   Armandina Stammer I, NP  diclofenac sodium (VOLTAREN) 1 % GEL Apply 2 g topically 4 (four) times daily. For arthritis pain 01/08/17   Armandina Stammer I, NP  insulin aspart (NOVOLOG) 100 UNIT/ML injection Inject 8 Units into the skin 3 (three) times daily with meals. For diabetes management 01/08/17   Armandina Stammer I, NP  insulin aspart (NOVOLOG) 100 UNIT/ML injection Inject 6 Units into the skin 3 (three) times daily with meals. 01/15/17   Truman Hayward, FNP  insulin glargine (LANTUS) 100 UNIT/ML injection Inject 0.45 mLs (45 Units total) into the skin at bedtime. For diabetic management 01/08/17   Armandina Stammer I, NP  insulin glargine (LANTUS) 100 UNIT/ML injection Inject 0.4 mLs (40 Units total) into the skin at bedtime. 01/15/17   Truman Hayward, FNP  lisinopril (PRINIVIL,ZESTRIL) 20 MG tablet Take 1 tablet (20 mg total) by mouth daily. For high blood pressure 01/08/17   Armandina Stammer I, NP  metFORMIN (GLUCOPHAGE) 1000  MG tablet Take 1 tablet (1,000 mg total) by mouth 2 (two) times daily with a meal. For diabetes management 01/08/17   Armandina Stammer I, NP  montelukast (SINGULAIR) 10 MG tablet Take 1 tablet (10 mg total) by mouth at bedtime. For shortness of breath 01/08/17   Armandina Stammer I, NP  nicotine polacrilex (NICORETTE) 2 MG gum Take 1 each (2 mg total) by mouth as needed for smoking cessation. 01/08/17   Armandina Stammer I, NP  pantoprazole (PROTONIX) 40 MG tablet Take 1 tablet (40 mg  total) by mouth daily. For acid reflux 01/08/17   Armandina Stammer I, NP  polyethylene glycol (MIRALAX / GLYCOLAX) packet Take 17 g by mouth daily. For constipation 01/08/17   Armandina Stammer I, NP  propranolol (INDERAL) 20 MG tablet Take 1 tablet (20 mg total) by mouth 2 (two) times daily. For high blood pressure 01/08/17   Nwoko, Nicole Kindred I, NP  simvastatin (ZOCOR) 20 MG tablet Take 1 tablet (20 mg total) by mouth daily at 6 PM. For high Cholesterol 01/08/17   Armandina Stammer I, NP  temazepam (RESTORIL) 7.5 MG capsule Take 1 capsule (7.5 mg total) by mouth at bedtime. For sleep 01/08/17   Armandina Stammer I, NP    Allergies Patient has no known allergies.  No family history on file.  Social History Social History  Substance Use Topics  . Smoking status: Current Every Day Smoker    Packs/day: 0.50    Types: Cigarettes  . Smokeless tobacco: Never Used  . Alcohol use No    Review of Systems Constitutional: No fever/chills Eyes: No visual changes. ENT: No sore throat. Cardiovascular: Denies chest pain. Respiratory: Denies shortness of breath. Gastrointestinal: No abdominal pain.  No nausea, no vomiting.  No diarrhea.  No constipation. Genitourinary: Negative for dysuria. Musculoskeletal: Negative for back pain. Skin: Negative for rash. Neurological: Negative for headaches, focal weakness or numbness.  ____________________________________________   PHYSICAL EXAM:  VITAL SIGNS: ED Triage Vitals  Enc Vitals Group     BP 01/16/17 0904 (!) 142/120     Pulse Rate 01/16/17 0904 82     Resp 01/16/17 0904 14     Temp 01/16/17 0904 98.7 F (37.1 C)     Temp Source 01/16/17 0904 Oral     SpO2 01/16/17 0904 100 %     Weight --      Height 01/16/17 0904 6' (1.829 m)     Head Circumference --      Peak Flow --      Pain Score 01/16/17 0913 0     Pain Loc --      Pain Edu? --      Excl. in GC? --     Constitutional: alert and oriented 4 disheveled strange affect tearful Eyes: PERRL EOMI. Head:  Atraumatic. Nose: No congestion/rhinnorhea. Mouth/Throat: No trismus Neck: No stridor.   Cardiovascular: Normal rate, regular rhythm. Grossly normal heart sounds.  Good peripheral circulation. Respiratory: Normal respiratory effort.  No retractions. Lungs CTAB and moving good air Gastrointestinal: soft nontender Musculoskeletal: No lower extremity edema   Neurologic:  Normal speech and language. No gross focal neurologic deficits are appreciated. Skin:  Skin is warm, dry and intact. No rash noted. Psychiatric: strange affect    ____________________________________________   DIFFERENTIAL includes but not limited to  Depression, suicidality, drug overdose ____________________________________________   LABS (all labs ordered are listed, but only abnormal results are displayed)  Labs Reviewed  COMPREHENSIVE METABOLIC PANEL - Abnormal; Notable for the  following:       Result Value   Chloride 96 (*)    Glucose, Bld 270 (*)    All other components within normal limits  ACETAMINOPHEN LEVEL - Abnormal; Notable for the following:    Acetaminophen (Tylenol), Serum <10 (*)    All other components within normal limits  CBC - Abnormal; Notable for the following:    WBC 13.3 (*)    All other components within normal limits  URINE DRUG SCREEN, QUALITATIVE ([facility] ONLY) - Abnormal; Notable for the following:    Benzodiazepine, Ur Scrn POSITIVE (*)    All other components within normal limits  ETHANOL  SALICYLATE LEVEL    Blood work unremarkable __________________________________________  EKG   ____________________________________________  RADIOLOGY   ____________________________________________   PROCEDURES  Procedure(s) performed: no  Procedures  Critical Care performed: no  Observation: no ____________________________________________   INITIAL IMPRESSION / ASSESSMENT AND PLAN / ED COURSE  Pertinent labs & imaging results that were available during my care of the  patient were reviewed by me and considered in my medical decision making (see chart for details).  ----------------------------------------- 9:56 AM on 01/16/2017 -----------------------------------------  On arrival the patient is well-appearing. He reports suicidality. He says that when he was discharged from his inpatient psychiatric facility yesterday she was not ready to go home. I'm not entirely convinced that this is true suicidality and may actually be more malingering because he does not like his group home. Regardless we'll contact the specialist on-call for their opinion.     ----------------------------------------- 1:43 PM on 01/16/2017 -----------------------------------------  Specialist on-call makes the following recommendations: 1. Patient needs acute inpatient psychiatric admission 2. Resume current meds he was taking up to yesterday: Symmetrel 100 mg twice a day Cogentin 1 mg twice a day Celexa 20 mg daily Clozapine 150 mg daily at bedtime If clozapine not available in the emergency department can substitute Zyprexa 10 mg by mouth twice a day  Patient MEETS criteria for involuntary commitment.  ____________________________________________   FINAL CLINICAL IMPRESSION(S) / ED DIAGNOSES  Final diagnoses:  Suicidal ideation      NEW MEDICATIONS STARTED DURING THIS VISIT:  New Prescriptions   No medications on file     Note:  This document was prepared using Dragon voice recognition software and may include unintentional dictation errors.     Merrily Brittle, MD 01/16/17 1524

## 2017-01-16 NOTE — ED Notes (Signed)
Patient from [facility] group home.  Phone number is [phone].

## 2017-01-16 NOTE — NC FL2 (Signed)
[place] MEDICAID FL2 LEVEL OF CARE SCREENING TOOL     IDENTIFICATION  Patient Name: Tony Cunningham Birthdate: 04-Apr-1976 Sex: male Admission Date (Current Location): 01/16/2017  [place] and [place] Number:  [address] [id number] [name] MCD; Medicare [id number] [id number] Facility and Address:  [facility], [address], [place], [place] [address]      Provider Number: [id number]  Attending Physician Name and Address:  Merrily Brittle, MD  Relative Name and Phone Number:  [name], mother.  [phone]    Current Level of Care: Hospital Recommended Level of Care: [facility] Prior Approval Number:    Date Approved/Denied:   PASRR Number: [id number]  Discharge Plan: Domiciliary (Rest home)    Current Diagnoses: Patient Active Problem List   Diagnosis Date Noted  . 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 Schizoaffective disorder, bipolar type (HCC) 12/22/2016  . Schizoaffective disorder, depressive type (HCC) 09/23/2016  . Polysubstance abuse 07/17/2016  . Tardive dyskinesia 07/16/2016  . Tobacco use disorder 07/07/2016  . Dyslipidemia 07/07/2016  . Asthma 07/07/2016  . HTN (hypertension) 07/06/2016  . Diabetes (HCC) 12/25/2010    Orientation RESPIRATION BLADDER Height & Weight     Self, Time, Situation  Normal Continent Weight:   Height:  6' (182.9 cm)  BEHAVIORAL SYMPTOMS/MOOD NEUROLOGICAL BOWEL NUTRITION STATUS      Continent Diet (Diabetic)  AMBULATORY STATUS COMMUNICATION OF NEEDS Skin   Independent Verbally Normal                       Personal Care Assistance Level of Assistance  Bathing, Feeding, Dressing, Total care Bathing Assistance: Limited assistance Feeding assistance: Independent Dressing Assistance: Limited assistance Total Care Assistance: Limited assistance   Functional Limitations Info  Sight, Hearing Sight Info: Adequate Hearing Info: Adequate Speech Info: Adequate    SPECIAL CARE  FACTORS FREQUENCY  Diabetic urine testing, Blood pressure Blood Pressure Frequency: has hypertension Diabetic Urine Testing Frequency: SEE MAR sliding scale insulin required                Contractures Contractures Info: Not present    Additional Factors Info  Allergies   Allergies Info: no known allergies           Current Medications (01/16/2017):  This is the current hospital active medication list No current facility-administered medications for this encounter.    Current Outpatient Prescriptions  Medication Sig Dispense Refill  . amantadine (SYMMETREL) 100 MG capsule Take 1 capsule (100 mg total) by mouth 2 (two) times daily. For prevention of drug induced tremors 60 capsule 0  . benztropine (COGENTIN) 1 MG tablet Take 1 tablet (1 mg total) by mouth 2 (two) times daily. For prevention of drug induced tremors 60 tablet 0  . cholecalciferol 400 units tablet Take 1 tablet (400 Units total) by mouth daily. For bone health 30 each 0  . citalopram (CELEXA) 20 MG tablet Take 1 tablet (20 mg total) by mouth daily. For depression 30 tablet 0  . cloZAPine (CLOZARIL) 50 MG tablet Take 3 tablets (150 mg total) by mouth at bedtime. For mood control 90 tablet 0  . cyclobenzaprine (FLEXERIL) 5 MG tablet Take 1 tablet (5 mg total) by mouth 3 (three) times daily as needed for muscle spasms. 15 tablet 0  . diclofenac sodium (VOLTAREN) 1 % GEL Apply 2 g topically 4 (four) times daily. For arthritis pain    . insulin aspart (NOVOLOG) 100 UNIT/ML injection Inject 8 Units into the skin 3 (three) times  daily with meals. For diabetes management 10 mL 0  . insulin aspart (NOVOLOG) 100 UNIT/ML injection Inject 6 Units into the skin 3 (three) times daily with meals. 10 mL 11  . insulin glargine (LANTUS) 100 UNIT/ML injection Inject 0.45 mLs (45 Units total) into the skin at bedtime. For diabetic management 10 mL 0  . insulin glargine (LANTUS) 100 UNIT/ML injection Inject 0.4 mLs (40 Units total) into  the skin at bedtime. 10 mL 11  . lisinopril (PRINIVIL,ZESTRIL) 20 MG tablet Take 1 tablet (20 mg total) by mouth daily. For high blood pressure 30 tablet 0  . metFORMIN (GLUCOPHAGE) 1000 MG tablet Take 1 tablet (1,000 mg total) by mouth 2 (two) times daily with a meal. For diabetes management 60 tablet 0  . montelukast (SINGULAIR) 10 MG tablet Take 1 tablet (10 mg total) by mouth at bedtime. For shortness of breath 30 tablet 0  . nicotine polacrilex (NICORETTE) 2 MG gum Take 1 each (2 mg total) by mouth as needed for smoking cessation. 100 tablet 0  . pantoprazole (PROTONIX) 40 MG tablet Take 1 tablet (40 mg total) by mouth daily. For acid reflux 30 tablet 0  . polyethylene glycol (MIRALAX / GLYCOLAX) packet Take 17 g by mouth daily. For constipation 14 each 0  . propranolol (INDERAL) 20 MG tablet Take 1 tablet (20 mg total) by mouth 2 (two) times daily. For high blood pressure 60 tablet 0  . simvastatin (ZOCOR) 20 MG tablet Take 1 tablet (20 mg total) by mouth daily at 6 PM. For high Cholesterol 30 tablet 0  . temazepam (RESTORIL) 7.5 MG capsule Take 1 capsule (7.5 mg total) by mouth at bedtime. For sleep 14 capsule 0     Discharge Medications: Please see discharge summary for a list of discharge medications.  Relevant Imaging Results:  Relevant Lab Results:   Additional Information Patient also has asthma and is on psychotropics. SSN [id number]  Cheron Schaumann, LCSW

## 2017-01-16 NOTE — ED Notes (Signed)
Report called to Margaret, RN in ED BHU 

## 2017-01-16 NOTE — ED Notes (Signed)
Dr Alphonzo Lemmings informed of pt's hx of diabetes and requested orders for finger stick for blood sugar and medications for his diabetes.

## 2017-01-16 NOTE — ED Notes (Addendum)
Pt went to bathroom and unable to obtain urine sample. Pt shaky and stating he needs help. He hears voices. Labs drawn and sent, vitals taken and pt changed into wine paper scrubs. Pt ambulated to room 21 with no difficulty, but walked slow. Pt came in with one hospital belongings bag containing a large cup and what appeared to be a pair of shorts. Pt clothes placed in belongings bag consisting of shoes, socks, pj pants, underwear and shirt. Pt belongings and bag placed in one bag labeled bag 1 of 1 and placed at RN quad station. RN given number and name of group home that rep gave this tech and RN was informed that group home rep stated they did not think he needed to come back to their facility and they and pt felt he needed more care than could be provided at this group home. [phone] [facility]. Group home rep also stated that pt is own guardian.
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
## 2017-01-17 DIAGNOSIS — F259 Schizoaffective disorder, unspecified: Secondary | ICD-10-CM | POA: Diagnosis not present

## 2017-01-17 LAB — GLUCOSE, CAPILLARY
GLUCOSE-CAPILLARY: 291 mg/dL — AB (ref 65–99)
GLUCOSE-CAPILLARY: 330 mg/dL — AB (ref 65–99)
Glucose-Capillary: 217 mg/dL — ABNORMAL HIGH (ref 65–99)
Glucose-Capillary: 185 mg/dL — ABNORMAL HIGH (ref 65–99)

## 2017-01-17 MED ORDER — OLANZAPINE 10 MG PO TABS
10.0000 mg | ORAL_TABLET | Freq: Every day | ORAL | Status: DC
  Administered 2017-01-17 – 2017-01-18 (×2): 10 mg via ORAL
  Filled 2017-01-17 (×2): qty 1

## 2017-01-17 MED ORDER — METFORMIN HCL 500 MG PO TABS
1000.0000 mg | ORAL_TABLET | Freq: Two times a day (BID) | ORAL | Status: DC
  Administered 2017-01-17 – 2017-01-18 (×4): 1000 mg via ORAL
  Filled 2017-01-17 (×4): qty 2

## 2017-01-17 MED ORDER — INSULIN ASPART 100 UNIT/ML ~~LOC~~ SOLN
0.0000 [IU] | Freq: Three times a day (TID) | SUBCUTANEOUS | Status: DC
  Administered 2017-01-17: 5 [IU] via SUBCUTANEOUS
  Administered 2017-01-18 (×2): 2 [IU] via SUBCUTANEOUS
  Filled 2017-01-17 (×3): qty 1

## 2017-01-17 MED ORDER — INSULIN ASPART 100 UNIT/ML ~~LOC~~ SOLN
0.0000 [IU] | Freq: Every day | SUBCUTANEOUS | Status: DC
  Administered 2017-01-17: 4 [IU] via SUBCUTANEOUS
  Administered 2017-01-18: 3 [IU] via SUBCUTANEOUS
  Filled 2017-01-17 (×2): qty 1

## 2017-01-17 NOTE — ED Notes (Signed)
Patient brought breakfast. 

## 2017-01-17 NOTE — Progress Notes (Signed)
Late Notice: Received a late call from Doristine Mango [phone] who reported pt was discharged from [facility] on Friday at [facility] and was unable to transition. Patient stated to [facility] he is suicidal and hears voices to hurt others. LCSW explained that its a [facility] responsibility to assist patient to transition into the community. Doristine Mango stated that is not what was arranged by [facility]) and that I should speak to him. Patient is to be admitted to [facility] as per TTS. Patient wishes to remain in hospital.  LCSW completed Fl2 and assessment and will send out information via the Hub for potential new [facility]  Salif Tay Gerber LCSW [phone]

## 2017-01-17 NOTE — ED Notes (Signed)
Spoke with ED Dr about insulin order- was told to hold insulin until pharmacy reconciles meds

## 2017-01-17 NOTE — ED Notes (Addendum)
Writer notified pharmacy  of pt's need for med reconciliation

## 2017-01-17 NOTE — ED Notes (Signed)
IVC rec inpatient

## 2017-01-17 NOTE — ED Notes (Addendum)
Dinner brought to patient 

## 2017-01-17 NOTE — Progress Notes (Signed)
Inpatient Diabetes Program Recommendations  AACE/ADA: New Consensus Statement on Inpatient Glycemic Control (2015)  Target Ranges:  Prepandial:   less than 140 mg/dL      Peak postprandial:   less than 180 mg/dL (1-2 hours)      Critically ill patients:  140 - 180 mg/dL   Lab Results  Component Value Date   GLUCAP 185 (H) 01/17/2017   HGBA1C 10.3 (H) 11/09/2016    Review of Glycemic Control Results for Tony Cunningham, Tony Cunningham (MRN [id number]) as of 01/17/2017 14:54  Ref. Range 01/15/2017 09:41 01/16/2017 22:09 01/16/2017 23:27 01/17/2017 07:27 01/17/2017 12:00  Glucose-Capillary Latest Ref Range: 65 - 99 mg/dL 270 (H) 623 (H) 762 (H) 217 (H) 185 (H)   Outpatient Diabetes medications: Lantus 55 units QHS Novolog 10 units TID with meals Metformin 1000 mg BID  Current Insulin Orders: Lantus 40 units QHS                                       Novolog 6-8 units TID meal coverage Metformin 1000 mg BID   MD- Please consider the following in-hospital insulin adjustments:  1. Novolog sensitive correction tid + hs  2. Novolog Meal Coverage to: Novolog 8 units TID with meals (hold if pt eats <50% of meal)   (D/C the 6 units MC)  Will follow.  Thank you, Billy Fischer. Analis Distler, RN, MSN, CDE  Diabetes Coordinator Inpatient Glycemic Control Team Team Pager [phone] (8am-5pm) 01/17/2017 2:59 PM

## 2017-01-17 NOTE — ED Notes (Signed)

## 2017-01-17 NOTE — ED Notes (Signed)
Patient still declining lunch- reporting that he isn't hungry

## 2017-01-17 NOTE — ED Notes (Signed)
Patient eating lunch with diet soda

## 2017-01-17 NOTE — ED Notes (Addendum)
Patient easily aroused from sleep for breakfast. Pt refuses to eat breakfast at this time and requests to sleep a little longer. When writer asked pt how he was feeling, he states, "Not good. Hearing voices". Pt denies pain but endorses auditory hallucinations that tell him, "to jump in front of a car". Pt remains safe with 15 minute checks

## 2017-01-17 NOTE — ED Notes (Signed)
Patient appears to be resting with eyes closed. Respirations even and un-labored.

## 2017-01-17 NOTE — ED Notes (Addendum)
Patient in dayroom pacing- motioned this Clinical research associate to come to him. Pt tears up and reports that the "voices are getting louder". This writer reassured pt that he was safe- pt proceeded to make a 'hugging' motion, then quickly went to shake this writer's hand. Pt now sitting in dayroom eating graham crackers and milk.

## 2017-01-17 NOTE — ED Provider Notes (Signed)
-----------------------------------------   6:47 AM on 01/17/2017 -----------------------------------------   Blood pressure 116/68, pulse 67, temperature 98.2 F (36.8 C), temperature source Oral, resp. rate 16, height 6' (1.829 m), SpO2 97 %.  The patient had no acute events since last update.  Calm and cooperative at this time.  Disposition is pending Psychiatry/Behavioral Medicine team recommendations.     Irean Hong, MD 01/17/17 [phone]

## 2017-01-17 NOTE — ED Notes (Signed)
Patient easily aroused from sleep for blood sugar check- Pt declines lunch, requesting to sleep for 45 minutes longer. Pt remains safe with 15 minute checks

## 2017-01-17 NOTE — ED Notes (Signed)
Pt. Alert and oriented, warm and dry, in no distress. Pt. Denies HI, and AH. Pt states having some Si without a plan and hearing voice to walk out in front of a car. Patient verbally contracts for safety with this Clinical research associate. Pt. Encouraged to let nursing staff know of any concerns or needs.

## 2017-01-17 NOTE — ED Notes (Signed)
Patient ate half of lunch and is requesting snack. Pt given graham crackers and milk and is sitting in dayroom eating

## 2017-01-17 NOTE — ED Notes (Signed)
Patient ate 100% of dinner

## 2017-01-18 DIAGNOSIS — F259 Schizoaffective disorder, unspecified: Secondary | ICD-10-CM | POA: Diagnosis not present

## 2017-01-18 DIAGNOSIS — F250 Schizoaffective disorder, bipolar type: Secondary | ICD-10-CM

## 2017-01-18 LAB — GLUCOSE, CAPILLARY
GLUCOSE-CAPILLARY: 118 mg/dL — AB (ref 65–99)
GLUCOSE-CAPILLARY: 187 mg/dL — AB (ref 65–99)
GLUCOSE-CAPILLARY: 156 mg/dL — AB (ref 65–99)
GLUCOSE-CAPILLARY: 254 mg/dL — AB (ref 65–99)

## 2017-01-18 NOTE — ED Notes (Signed)
Lunch brought to patient 

## 2017-01-18 NOTE — ED Notes (Signed)
Pt IVC/ pending placement  

## 2017-01-18 NOTE — BH Assessment (Signed)
Patient is to be admitted to [facility] by Dr. Toni Amend.  Attending Physician will be Dr. Jennet Maduro.   Patient has been assigned to room 307, by [facility] Charge Nurse [place].   Intake Paper Work has been signed and placed on patient chart.  ER staff is aware of the admission [facility]  Selena Batten Patient's Nurse & Karolee Stamps Patient Access).

## 2017-01-18 NOTE — ED Notes (Signed)
Patient laying in bed with eyes closed. Respirations even and unlabored.

## 2017-01-18 NOTE — ED Notes (Signed)
Behavior Activity: Lying in bed with eyes closed. Appears to be asleep Attitude: Cooperative Mood/Affect: UTA Thought Process: UTA Thought Content: UTA Perceptual Disturbance: UTA Orientation: UTA Hygiene: [name] Meals: [name] Visitors: [name] PRN Medication: [name]  

## 2017-01-18 NOTE — ED Notes (Addendum)
Behavior Activity: Lying in bed/ Eating Attitude: Cooperative Mood/Affect: Depressed/Sad Thought Process: AH Thought Content: Voices telling him to harm self; pt contracts for safety. Perceptual Disturbance: AH Orientation: A/OX4 Hygiene: Minimal Meals: 75% of snack Visitors: [name] PRN Medication: [name]

## 2017-01-18 NOTE — ED Notes (Signed)
Patient easily aroused from sleep. Pt still requesting to sleep longer before eating- stating, "I'm not hungry". Pt remains safe with 15 minute checks.

## 2017-01-18 NOTE — ED Notes (Signed)
Patient up to bathroom. Pt given snack of graham crackers and milk and is sitting in the dayroom

## 2017-01-18 NOTE — ED Notes (Signed)
Patient still refusing to eat but agreed to diet coke

## 2017-01-18 NOTE — ED Notes (Signed)
Patient in dayroom talking on phone. Pt has been isolative for much of the shift, staying in bed-

## 2017-01-18 NOTE — ED Notes (Signed)
Patient awake and alert, but refuses breakfast requesting to sleep longer. Pt remains safe with 15 minute checks

## 2017-01-18 NOTE — ED Notes (Signed)
Behavior Activity: Lying in bed Attitude: Cooperative Mood/Affect: Depressed/sad Thought Process: AH Thought Content: Voices Perceptual Disturbance: AH Orientation: A/OX4 Hygiene: Minimal Meals: [name] Visitors: [name] PRN Medication: [name]

## 2017-01-18 NOTE — ED Provider Notes (Signed)
-----------------------------------------   6:59 AM on 01/18/2017 -----------------------------------------   Blood pressure 107/77, pulse 73, temperature 97.8 F (36.6 C), temperature source Oral, resp. rate 16, height 6' (1.829 m), SpO2 99 %.  The patient had no acute events since last update.  Sleeping at this time.  Disposition is pending Psychiatry/Behavioral Medicine team recommendations.     Irean Hong, MD 01/18/17 [phone]

## 2017-01-18 NOTE — Consult Note (Signed)
[facility] Face-to-Face Psychiatry Consult   Reason for Consult:  Consult for 41 year old man with a history of schizoaffective disorder who is brought into the hospital a mere day after his most recent discharge Referring Physician:  Cyril Loosen Patient Identification: Colyn Miron MRN:  [id number] Principal Diagnosis: Schizoaffective disorder, bipolar type [facility]) Diagnosis:   Patient Active Problem List   Diagnosis Date Noted  . 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 Schizoaffective disorder, bipolar type (HCC) [F25.0] 12/22/2016  . Schizoaffective disorder, depressive type (HCC) [F25.1] 09/23/2016  . Polysubstance abuse [F19.10] 07/17/2016  . Tardive dyskinesia [G24.01] 07/16/2016  . Tobacco use disorder [F17.200] 07/07/2016  . Dyslipidemia [E78.5] 07/07/2016  . Asthma [J45.909] 07/07/2016  . HTN (hypertension) [I10] 07/06/2016  . Diabetes (HCC) [E11.9] 12/25/2010    Total Time spent with patient: 45 minutes  Subjective:   Proctor Carriker is a 41 y.o. male patient admitted with "the voices are so bad".  HPI:  Patient interview chart reviewed. Patient is familiar to me. 41 year old man with a history of chronic mental illness. He was just discharged from behavioral [facility] last Friday and was brought back to our emergency room in less than 24 hours. Apparently despite the fact that they documented that he was greatly improved at discharge as soon as he got to his new group home he told him that he was hearing voices and thinking of walking out into traffic. On interview today the patient presents as. He upset. Her full. Trembling. Says the voices are loud and are telling him to kill himself. Says he feels helpless and overwhelmed despite being on his medicine. Can't give any thoughts full reason for why he would've decompensated so quickly. No evidence of substance abuse.  Social history: This gentleman has family in [place] but when we tried placing him there the time  before last he had failed in short order. He has been institutionalized now for so long he seems to be having a lot of trouble functioning outside of a locked setting.  Medical history: Significant medical problems including difficult to control diabetes and hypertension  Substance abuse history: No history of alcohol or drug abuse identified no evidence of recent substance abuse  Past Psychiatric History: Patient has had multiple hospitalizations one after another. He was at our hospital for an extended period of time before discharge and then returned almost immediately. I readmitted him and we did ECT and started him on clozapine which together seem to be of partial benefit but on discharge he almost immediately bounced back to behavioral health Hospital. Now after a fairly lengthy stay there he is discharged to a group home and has relapsed again in less than 24 hours. He does have a history of suicide attempts no history of violence history of hallucinations and psychosis.  Risk to Self: Suicidal Ideation: Yes-Currently Present Suicidal Intent: Yes-Currently Present Is patient at risk for suicide?: Yes Suicidal Plan?: Yes-Currently Present Specify Current Suicidal Plan: Pt hears voices that tell him to jump in front of cars Access to Means: Yes Specify Access to Suicidal Means: Pt can walk in front of cars if not committed What has been your use of drugs/alcohol within the last 12 months?: Pt denies How many times?: 2 Other Self Harm Risks: Pt reports he has burned himself previously Triggers for Past Attempts: Hallucinations Intentional Self Injurious Behavior: Burning Comment - Self Injurious Behavior: Pt reports he burns himself Risk to Others: Homicidal Ideation: Yes-Currently Present Thoughts of Harm to Others: Yes-Currently Present Comment -  Thoughts of Harm to Others: Pt states he hears voices that tell him to "go for it" Current Homicidal Intent: Yes-Currently Present Current  Homicidal Plan: Yes-Currently Present Describe Current Homicidal Plan: No plan--just hears voices Access to Homicidal Means: Yes Describe Access to Homicidal Means: Pt hears voices that tells him to "go for it" Identified Victim: [name] History of harm to others?: No Assessment of Violence: None Noted Violent Behavior Description: [name] Does patient have access to weapons?: No Criminal Charges Pending?: No Does patient have a court date: No Prior Inpatient Therapy: Prior Inpatient Therapy: Yes Prior Therapy Dates: Unknown Prior [facility]ilty[facility]Provider[facility] Reason for Treatment: Behavioral Health Prior Outpatient Therapy: Prior Outpatient Therapy: Yes Prior Therapy Dates: Unknown Prior Therapy Facilty/Provider(s): [facility], [facility] Reason for Treatment: Behavioral Health Does patient have an ACCT team?: No Does patient have Intensive In-House Services?  : No Does patient have [facility] services? : No Does patient have P4CC services?: No  Past Medical History:  Past Medical History:  Diagnosis Date  . Anxiety   . Asthma   . Diabetes mellitus   . High blood pressure   . Sinus complaint    History reviewed. No pertinent surgical history. Family History: No family history on file. Family Psychiatric  History: Unknown Social History:  History  Alcohol Use No     History  Drug Use No    Social History   Social History  . Marital status: Single    Spouse name: [name]  . Number of children: [name]  . Years of education: [name]   Social History Main Topics  . Smoking status: Current Every Day Smoker    Packs/day: 0.50    Types: Cigarettes  . Smokeless tobacco: Never Used  . Alcohol use No  . Drug use: No  . Sexual activity: Not Currently   Other Topics Concern  . None   Social History Narrative  . None   Additional Social History:    Allergies:  No Known Allergies  Labs:  Results for orders placed or performed during the hospital encounter of 01/16/17  (from the past 48 hour(s))  Glucose, capillary     Status: Abnormal   Collection Time: 01/16/17 10:09 PM  Result Value Ref Range   Glucose-Capillary 412 (H) 65 - 99 mg/dL  Glucose, capillary     Status: Abnormal   Collection Time: 01/16/17 11:27 PM  Result Value Ref Range   Glucose-Capillary 359 (H) 65 - 99 mg/dL  Glucose, capillary     Status: Abnormal   Collection Time: 01/17/17  7:27 AM  Result Value Ref Range   Glucose-Capillary 217 (H) 65 - 99 mg/dL  Glucose, capillary     Status: Abnormal   Collection Time: 01/17/17 12:00 PM  Result Value Ref Range   Glucose-Capillary 185 (H) 65 - 99 mg/dL  Glucose, capillary     Status: Abnormal   Collection Time: 01/17/17  4:38 PM  Result Value Ref Range   Glucose-Capillary 291 (H) 65 - 99 mg/dL  Glucose, capillary     Status: Abnormal   Collection Time: 01/17/17  8:12 PM  Result Value Ref Range   Glucose-Capillary 330 (H) 65 - 99 mg/dL  Glucose, capillary     Status: Abnormal   Collection Time: 01/18/17  8:03 AM  Result Value Ref Range   Glucose-Capillary 118 (H) 65 - 99 mg/dL  Glucose, capillary     Status: Abnormal   Collection Time: 01/18/17 12:01 PM  Result Value Ref Range  Glucose-Capillary 187 (H) 65 - 99 mg/dL  Glucose, capillary     Status: Abnormal   Collection Time: 01/18/17  4:46 PM  Result Value Ref Range   Glucose-Capillary 156 (H) 65 - 99 mg/dL    Current Facility-Administered Medications  Medication Dose Route Frequency Provider Last Rate Last Dose  . amantadine (SYMMETREL) capsule 100 mg  100 mg Oral BID Merrily Brittle, MD   100 mg at 01/18/17 1000  . benztropine (COGENTIN) tablet 1 mg  1 mg Oral BID Jeanmarie Plant, MD   1 mg at 01/18/17 1000  . citalopram (CELEXA) tablet 20 mg  20 mg Oral Daily Jeanmarie Plant, MD   20 mg at 01/18/17 1000  . cloZAPine (CLOZARIL) tablet 150 mg  150 mg Oral QHS Merrily Brittle, MD   150 mg at 01/17/17 2127  . insulin aspart (novoLOG) injection 0-5 Units  0-5 Units Subcutaneous  QHS Phineas Semen, MD   4 Units at 01/17/17 2129  . insulin aspart (novoLOG) injection 0-9 Units  0-9 Units Subcutaneous TID WC Phineas Semen, MD   2 Units at 01/18/17 1714  . insulin aspart (novoLOG) injection 8 Units  8 Units Subcutaneous TID WC Myrna Blazer, MD   8 Units at 01/17/17 1732  . insulin glargine (LANTUS) injection 40 Units  40 Units Subcutaneous QHS Jeanmarie Plant, MD   40 Units at 01/17/17 2128  . lisinopril (PRINIVIL,ZESTRIL) tablet 20 mg  20 mg Oral Daily Jeanmarie Plant, MD   20 mg at 01/18/17 1002  . metFORMIN (GLUCOPHAGE) tablet 1,000 mg  1,000 mg Oral BID WC Myrna Blazer, MD   1,000 mg at 01/18/17 1655  . montelukast (SINGULAIR) tablet 10 mg  10 mg Oral QHS Jeanmarie Plant, MD   10 mg at 01/17/17 2127  . OLANZapine (ZYPREXA) tablet 10 mg  10 mg Oral QHS Phineas Semen, MD   10 mg at 01/17/17 2017  . pantoprazole (PROTONIX) EC tablet 40 mg  40 mg Oral Daily Jeanmarie Plant, MD   40 mg at 01/18/17 1001  . propranolol (INDERAL) tablet 20 mg  20 mg Oral BID Jeanmarie Plant, MD   20 mg at 01/18/17 1001  . temazepam (RESTORIL) capsule 7.5 mg  7.5 mg Oral QHS Jeanmarie Plant, MD   7.5 mg at 01/17/17 2126   Current Outpatient Prescriptions  Medication Sig Dispense Refill  . amantadine (SYMMETREL) 100 MG capsule Take 1 capsule (100 mg total) by mouth 2 (two) times daily. For prevention of drug induced tremors 60 capsule 0  . benztropine (COGENTIN) 1 MG tablet Take 1 tablet (1 mg total) by mouth 2 (two) times daily. For prevention of drug induced tremors 60 tablet 0  . cholecalciferol 400 units tablet Take 1 tablet (400 Units total) by mouth daily. For bone health 30 each 0  . citalopram (CELEXA) 20 MG tablet Take 1 tablet (20 mg total) by mouth daily. For depression 30 tablet 0  . cloZAPine (CLOZARIL) 50 MG tablet Take 3 tablets (150 mg total) by mouth at bedtime. For mood control 90 tablet 0  . cyclobenzaprine (FLEXERIL) 5 MG tablet Take 1  tablet (5 mg total) by mouth 3 (three) times daily as needed for muscle spasms. 15 tablet 0  . insulin aspart (NOVOLOG) 100 UNIT/ML injection Inject 6 Units into the skin 3 (three) times daily with meals. 10 mL 11  . lisinopril (PRINIVIL,ZESTRIL) 20 MG tablet Take 1 tablet (20 mg total) by mouth  daily. For high blood pressure 30 tablet 0  . metFORMIN (GLUCOPHAGE) 1000 MG tablet Take 1 tablet (1,000 mg total) by mouth 2 (two) times daily with a meal. For diabetes management 60 tablet 0  . montelukast (SINGULAIR) 10 MG tablet Take 1 tablet (10 mg total) by mouth at bedtime. For shortness of breath 30 tablet 0  . nicotine polacrilex (NICORETTE) 2 MG gum Take 1 each (2 mg total) by mouth as needed for smoking cessation. 100 tablet 0  . pantoprazole (PROTONIX) 40 MG tablet Take 1 tablet (40 mg total) by mouth daily. For acid reflux 30 tablet 0  . polyethylene glycol (MIRALAX / GLYCOLAX) packet Take 17 g by mouth daily. For constipation 14 each 0  . propranolol (INDERAL) 20 MG tablet Take 1 tablet (20 mg total) by mouth 2 (two) times daily. For high blood pressure 60 tablet 0  . simvastatin (ZOCOR) 20 MG tablet Take 1 tablet (20 mg total) by mouth daily at 6 PM. For high Cholesterol 30 tablet 0  . temazepam (RESTORIL) 7.5 MG capsule Take 1 capsule (7.5 mg total) by mouth at bedtime. For sleep 14 capsule 0  . diclofenac sodium (VOLTAREN) 1 % GEL Apply 2 g topically 4 (four) times daily. For arthritis pain    . insulin glargine (LANTUS) 100 UNIT/ML injection Inject 0.45 mLs (45 Units total) into the skin at bedtime. For diabetic management 10 mL 0  . insulin glargine (LANTUS) 100 UNIT/ML injection Inject 0.4 mLs (40 Units total) into the skin at bedtime. 10 mL 11    Musculoskeletal: Strength & Muscle Tone: within normal limits Gait & Station: normal Patient leans: [name]  Psychiatric Specialty Exam: Physical Exam  Nursing note and vitals reviewed. Constitutional: He appears well-developed and  well-nourished.  HENT:  Head: Normocephalic and atraumatic.  Eyes: Pupils are equal, round, and reactive to light. Conjunctivae are normal.  Neck: Normal range of motion.  Cardiovascular: Regular rhythm and normal heart sounds.   Respiratory: Effort normal. No respiratory distress.  GI: Soft.  Musculoskeletal: Normal range of motion.  Neurological: He is alert.  Skin: Skin is warm and dry.  Psychiatric: His mood appears anxious. His speech is delayed and tangential. He is withdrawn. Thought content is paranoid. Cognition and memory are impaired. He expresses impulsivity. He exhibits a depressed mood. He expresses suicidal ideation.    Review of Systems  Constitutional: Negative.   HENT: Negative.   Eyes: Negative.   Respiratory: Negative.   Cardiovascular: Negative.   Gastrointestinal: Negative.   Musculoskeletal: Negative.   Skin: Negative.   Neurological: Negative.   Psychiatric/Behavioral: Positive for depression, hallucinations, memory loss and suicidal ideas. Negative for substance abuse. The patient is nervous/anxious and has insomnia.     Blood pressure 114/87, pulse 82, temperature 98.1 F (36.7 C), temperature source Oral, resp. rate 16, height 6' (1.829 m), SpO2 100 %.There is no height or weight on file to calculate BMI.  General Appearance: Disheveled  Eye Contact:  Fair  Speech:  Slow  Volume:  Decreased  Mood:  Depressed  Affect:  Congruent  Thought Process:  Coherent  Orientation:  Full (Time, Place, and Person)  Thought Content:  Hallucinations: Auditory and Paranoid Ideation  Suicidal Thoughts:  Yes.  with intent/plan  Homicidal Thoughts:  No  Memory:  Immediate;   Fair Recent;   Fair Remote;   Fair  Judgement:  Impaired  Insight:  Shallow  Psychomotor Activity:  Decreased  Concentration:  Concentration: Poor  Recall:  Poor  Fund of Knowledge:  Fair  Language:  Fair  Akathisia:  No  Handed:  Right  AIMS (if indicated):     Assets:  Desire for  Improvement  ADL's:  Impaired  Cognition:  Impaired,  Mild  Sleep:        Treatment Plan Summary: Daily contact with patient to assess and evaluate symptoms and progress in treatment, Medication management and Plan 41 year old man with ongoing psychotic and mood symptoms. Difficult situation as he has now had multiple lengthy hospitalizations one after another. We have in the past tried sending him back to [facility] without any success. Last time we tried that he sat in the emergency room for several weeks without any progress towards going to [facility] before we finally admitted him to our unit. Not clear whether we would have any more success with it at this point although he probably does need a much longer term hospitalization. Based on this and on the patient's level of distress I will go ahead and admit him back downstairs to the psychiatric unit. Continue clozapine and other medicines. Continue sliding scale insulin. 15 minute checks down stairs.  Disposition: Recommend psychiatric Inpatient admission when medically cleared. Supportive therapy provided about ongoing stressors.  Mordecai Rasmussen, MD 01/18/2017 7:04 PM

## 2017-01-18 NOTE — ED Notes (Signed)
Behavior Activity: Resting quietly in bed Attitude: Cooperative Mood/Affect: UTA Thought Process: UTA Thought Content: UTA Perceptual Disturbance: UTA Orientation: UTA Hygiene: [name] Meals: [name] Visitors: [name] PRN Medication: [name]

## 2017-01-18 NOTE — ED Notes (Signed)
Patient brought dinner 

## 2017-01-19 ENCOUNTER — Inpatient Hospital Stay
Admission: AD | Admit: 2017-01-19 | Discharge: 2017-02-10 | DRG: 885 | Disposition: A | Discharge status: Home / Self Care | Payer: Medicare Other | Attending: Psychiatry | Admitting: Psychiatry

## 2017-01-19 DIAGNOSIS — R45851 Suicidal ideations: Secondary | ICD-10-CM | POA: Diagnosis present

## 2017-01-19 DIAGNOSIS — F17210 Nicotine dependence, cigarettes, uncomplicated: Secondary | ICD-10-CM | POA: Diagnosis present

## 2017-01-19 DIAGNOSIS — G2111 Neuroleptic induced parkinsonism: Secondary | ICD-10-CM | POA: Diagnosis present

## 2017-01-19 DIAGNOSIS — F333 Major depressive disorder, recurrent, severe with psychotic symptoms: Secondary | ICD-10-CM | POA: Diagnosis not present

## 2017-01-19 DIAGNOSIS — G2401 Drug induced subacute dyskinesia: Secondary | ICD-10-CM | POA: Diagnosis present

## 2017-01-19 DIAGNOSIS — E785 Hyperlipidemia, unspecified: Secondary | ICD-10-CM | POA: Diagnosis present

## 2017-01-19 DIAGNOSIS — Z794 Long term (current) use of insulin: Secondary | ICD-10-CM | POA: Diagnosis not present

## 2017-01-19 DIAGNOSIS — I10 Essential (primary) hypertension: Secondary | ICD-10-CM | POA: Diagnosis present

## 2017-01-19 DIAGNOSIS — Z79899 Other long term (current) drug therapy: Secondary | ICD-10-CM | POA: Diagnosis not present

## 2017-01-19 DIAGNOSIS — F251 Schizoaffective disorder, depressive type: Secondary | ICD-10-CM | POA: Diagnosis present

## 2017-01-19 DIAGNOSIS — G4700 Insomnia, unspecified: Secondary | ICD-10-CM | POA: Diagnosis present

## 2017-01-19 DIAGNOSIS — Z915 Personal history of self-harm: Secondary | ICD-10-CM | POA: Diagnosis not present

## 2017-01-19 DIAGNOSIS — F17200 Nicotine dependence, unspecified, uncomplicated: Secondary | ICD-10-CM | POA: Diagnosis present

## 2017-01-19 DIAGNOSIS — F419 Anxiety disorder, unspecified: Secondary | ICD-10-CM | POA: Diagnosis present

## 2017-01-19 DIAGNOSIS — F323 Major depressive disorder, single episode, severe with psychotic features: Secondary | ICD-10-CM | POA: Diagnosis present

## 2017-01-19 DIAGNOSIS — J45909 Unspecified asthma, uncomplicated: Secondary | ICD-10-CM | POA: Diagnosis present

## 2017-01-19 DIAGNOSIS — K219 Gastro-esophageal reflux disease without esophagitis: Secondary | ICD-10-CM | POA: Diagnosis present

## 2017-01-19 DIAGNOSIS — E119 Type 2 diabetes mellitus without complications: Secondary | ICD-10-CM

## 2017-01-19 LAB — TSH: TSH: 4.415 u[IU]/mL (ref 0.350–4.500)

## 2017-01-19 LAB — GLUCOSE, CAPILLARY
GLUCOSE-CAPILLARY: 92 mg/dL (ref 65–99)
GLUCOSE-CAPILLARY: 339 mg/dL — AB (ref 65–99)
GLUCOSE-CAPILLARY: 59 mg/dL — AB (ref 65–99)
GLUCOSE-CAPILLARY: 172 mg/dL — AB (ref 65–99)
Glucose-Capillary: 66 mg/dL (ref 65–99)
Glucose-Capillary: 118 mg/dL — ABNORMAL HIGH (ref 65–99)

## 2017-01-19 LAB — LIPID PANEL
CHOL/HDL RATIO: 3.4 ratio
CHOLESTEROL: 122 mg/dL (ref 0–200)
HDL: 36 mg/dL — ABNORMAL LOW (ref >40)
LDL CALC: 55 mg/dL (ref 0–99)
Triglycerides: 154 mg/dL — ABNORMAL HIGH (ref <150)
VLDL: 31 mg/dL (ref 0–40)

## 2017-01-19 LAB — HEMOGLOBIN A1C
Hgb A1c MFr Bld: 7.6 % — ABNORMAL HIGH (ref 4.8–5.6)
Mean Plasma Glucose: 171.42 mg/dL

## 2017-01-19 MED ORDER — METFORMIN HCL 500 MG PO TABS
1000.0000 mg | ORAL_TABLET | Freq: Two times a day (BID) | ORAL | Status: DC
  Administered 2017-01-19 – 2017-02-10 (×42): 1000 mg via ORAL
  Filled 2017-01-19 (×43): qty 2

## 2017-01-19 MED ORDER — ACETAMINOPHEN 325 MG PO TABS
650.0000 mg | ORAL_TABLET | Freq: Four times a day (QID) | ORAL | Status: DC | PRN
Start: 2017-01-19 — End: 2017-02-10
  Administered 2017-01-22 – 2017-02-07 (×3): 650 mg via ORAL
  Filled 2017-01-19 (×4): qty 2

## 2017-01-19 MED ORDER — LISINOPRIL 20 MG PO TABS
20.0000 mg | ORAL_TABLET | Freq: Every day | ORAL | Status: DC
  Administered 2017-01-19 – 2017-02-10 (×23): 20 mg via ORAL
  Filled 2017-01-19 (×24): qty 1

## 2017-01-19 MED ORDER — INSULIN ASPART 100 UNIT/ML ~~LOC~~ SOLN
0.0000 [IU] | Freq: Every day | SUBCUTANEOUS | Status: DC
  Administered 2017-01-21: 4 [IU] via SUBCUTANEOUS
  Administered 2017-01-28: 2 [IU] via SUBCUTANEOUS
  Administered 2017-01-30: 4 [IU] via SUBCUTANEOUS
  Administered 2017-01-31: 5 [IU] via SUBCUTANEOUS
  Administered 2017-02-01: 4 [IU] via SUBCUTANEOUS
  Administered 2017-02-04 – 2017-02-07 (×2): 5 [IU] via SUBCUTANEOUS
  Administered 2017-02-08: 3 [IU] via SUBCUTANEOUS
  Administered 2017-02-09: 2 [IU] via SUBCUTANEOUS
  Filled 2017-01-19 (×5): qty 1

## 2017-01-19 MED ORDER — INSULIN ASPART 100 UNIT/ML ~~LOC~~ SOLN
0.0000 [IU] | Freq: Three times a day (TID) | SUBCUTANEOUS | Status: DC
  Administered 2017-01-19: 7 [IU] via SUBCUTANEOUS
  Administered 2017-01-20: 1 [IU] via SUBCUTANEOUS
  Administered 2017-01-21: 2 [IU] via SUBCUTANEOUS
  Administered 2017-01-21 – 2017-01-22 (×2): 1 [IU] via SUBCUTANEOUS
  Administered 2017-01-22: 3 [IU] via SUBCUTANEOUS
  Administered 2017-01-23: 2 [IU] via SUBCUTANEOUS
  Administered 2017-01-23: 1 [IU] via SUBCUTANEOUS
  Administered 2017-01-24: 5 [IU] via SUBCUTANEOUS
  Administered 2017-01-24: 2 [IU] via SUBCUTANEOUS
  Administered 2017-01-25: 3 [IU] via SUBCUTANEOUS
  Administered 2017-01-25: 2 [IU] via SUBCUTANEOUS
  Administered 2017-01-26: 5 [IU] via SUBCUTANEOUS
  Administered 2017-01-26: 3 [IU] via SUBCUTANEOUS
  Administered 2017-01-27: 2 [IU] via SUBCUTANEOUS
  Administered 2017-01-27: 3 [IU] via SUBCUTANEOUS
  Administered 2017-01-28 (×2): 9 [IU] via SUBCUTANEOUS
  Administered 2017-01-28 – 2017-01-29 (×2): 2 [IU] via SUBCUTANEOUS
  Administered 2017-01-29: 3 [IU] via SUBCUTANEOUS
  Administered 2017-01-30: 1 [IU] via SUBCUTANEOUS
  Administered 2017-01-30 (×2): 3 [IU] via SUBCUTANEOUS
  Administered 2017-01-31: 2 [IU] via SUBCUTANEOUS
  Administered 2017-01-31: 5 [IU] via SUBCUTANEOUS
  Administered 2017-02-01 (×2): 2 [IU] via SUBCUTANEOUS
  Administered 2017-02-02 (×2): 3 [IU] via SUBCUTANEOUS
  Administered 2017-02-03: 2 [IU] via SUBCUTANEOUS
  Administered 2017-02-03: 5 [IU] via SUBCUTANEOUS
  Administered 2017-02-04: 2 [IU] via SUBCUTANEOUS
  Administered 2017-02-04: 7 [IU] via SUBCUTANEOUS
  Administered 2017-02-05: 5 [IU] via SUBCUTANEOUS
  Administered 2017-02-05 – 2017-02-06 (×2): 2 [IU] via SUBCUTANEOUS
  Administered 2017-02-06: 3 [IU] via SUBCUTANEOUS
  Administered 2017-02-07 – 2017-02-09 (×4): 5 [IU] via SUBCUTANEOUS
  Administered 2017-02-09: 4 [IU] via SUBCUTANEOUS
  Administered 2017-02-09: 9 [IU] via SUBCUTANEOUS
  Administered 2017-02-10: 3 [IU] via SUBCUTANEOUS
  Filled 2017-01-19 (×21): qty 1

## 2017-01-19 MED ORDER — INSULIN GLARGINE 100 UNIT/ML ~~LOC~~ SOLN
40.0000 [IU] | Freq: Every day | SUBCUTANEOUS | Status: DC
  Administered 2017-01-19: 40 [IU] via SUBCUTANEOUS
  Filled 2017-01-19 (×2): qty 0.4

## 2017-01-19 MED ORDER — MONTELUKAST SODIUM 10 MG PO TABS
10.0000 mg | ORAL_TABLET | Freq: Every day | ORAL | Status: DC
  Administered 2017-01-19 – 2017-02-09 (×22): 10 mg via ORAL
  Filled 2017-01-19 (×22): qty 1

## 2017-01-19 MED ORDER — AMANTADINE HCL 100 MG PO CAPS
100.0000 mg | ORAL_CAPSULE | Freq: Two times a day (BID) | ORAL | Status: DC
  Administered 2017-01-19: 100 mg via ORAL
  Filled 2017-01-19: qty 1

## 2017-01-19 MED ORDER — PROPRANOLOL HCL 20 MG PO TABS
20.0000 mg | ORAL_TABLET | Freq: Two times a day (BID) | ORAL | Status: DC
  Administered 2017-01-19 – 2017-02-10 (×44): 20 mg via ORAL
  Filled 2017-01-19 (×46): qty 1

## 2017-01-19 MED ORDER — NICOTINE 21 MG/24HR TD PT24
21.0000 mg | MEDICATED_PATCH | Freq: Every day | TRANSDERMAL | Status: DC
  Administered 2017-01-29 – 2017-02-07 (×3): 21 mg via TRANSDERMAL
  Filled 2017-01-19 (×10): qty 1

## 2017-01-19 MED ORDER — ALUM & MAG HYDROXIDE-SIMETH 200-200-20 MG/5ML PO SUSP: 30.0000 mL | ORAL | Status: DC | PRN

## 2017-01-19 MED ORDER — BENZTROPINE MESYLATE 1 MG PO TABS
1.0000 mg | ORAL_TABLET | Freq: Two times a day (BID) | ORAL | Status: DC
  Administered 2017-01-19: 1 mg via ORAL
  Filled 2017-01-19: qty 1

## 2017-01-19 MED ORDER — TEMAZEPAM 7.5 MG PO CAPS
7.5000 mg | ORAL_CAPSULE | Freq: Every day | ORAL | Status: DC
  Administered 2017-01-19 – 2017-01-21 (×3): 7.5 mg via ORAL
  Filled 2017-01-19 (×3): qty 1

## 2017-01-19 MED ORDER — CLOZAPINE 25 MG PO TABS
150.0000 mg | ORAL_TABLET | Freq: Every day | ORAL | Status: DC
  Administered 2017-01-19 – 2017-02-09 (×22): 150 mg via ORAL
  Filled 2017-01-19 (×20): qty 1
  Filled 2017-01-19 (×2): qty 2
  Filled 2017-01-19: qty 1

## 2017-01-19 MED ORDER — PANTOPRAZOLE SODIUM 40 MG PO TBEC
40.0000 mg | DELAYED_RELEASE_TABLET | Freq: Every day | ORAL | Status: DC
  Administered 2017-01-19 – 2017-02-10 (×22): 40 mg via ORAL
  Filled 2017-01-19 (×22): qty 1

## 2017-01-19 MED ORDER — OLANZAPINE 10 MG PO TABS
10.0000 mg | ORAL_TABLET | Freq: Every day | ORAL | Status: DC
  Administered 2017-01-19 – 2017-01-22 (×4): 10 mg via ORAL
  Filled 2017-01-19 (×4): qty 1

## 2017-01-19 MED ORDER — INSULIN ASPART 100 UNIT/ML ~~LOC~~ SOLN
8.0000 [IU] | Freq: Three times a day (TID) | SUBCUTANEOUS | Status: DC
  Administered 2017-01-19 – 2017-01-20 (×3): 8 [IU] via SUBCUTANEOUS
  Filled 2017-01-19 (×2): qty 1

## 2017-01-19 MED ORDER — MAGNESIUM HYDROXIDE 400 MG/5ML PO SUSP: 30.0000 mL | Freq: Every day | ORAL | Status: DC | PRN

## 2017-01-19 MED ORDER — CITALOPRAM HYDROBROMIDE 20 MG PO TABS
20.0000 mg | ORAL_TABLET | Freq: Every day | ORAL | Status: DC
  Administered 2017-01-19 – 2017-01-25 (×7): 20 mg via ORAL
  Filled 2017-01-19 (×7): qty 1

## 2017-01-19 NOTE — ED Notes (Signed)
Behavior Activity: Lying in bed with eyes closed. Appears to be asleep Attitude: Cooperative Mood/Affect: UTA Thought Process: UTA Thought Content: UTA Perceptual Disturbance: UTA Orientation: UTA Hygiene: [name] Meals: [name] Visitors: [name] PRN Medication: [name]

## 2017-01-19 NOTE — Progress Notes (Signed)
Patient ID: Tony Cunningham, male   DOB: Mar 20, 1976, 41 y.o.   MRN: [id number] Gross Skin Intact, no open wounds, no tattoo except a lot of hairs and unkempt appearance; Contraband Search completed with Trinna Post, RN and Ms Liborio Nixon, MHT; no contrabands found on patient and in his belongings. CBG=92.

## 2017-01-19 NOTE — [facility] Group Notes (Signed)
[facility] LCSW Group Therapy Note  Date/Time: 01/19/17, 3:00pm  Type of Therapy/Topic:  Group Therapy:  Feelings about Diagnosis  Participation Level:  Did Not Attend   Glennon Mac, LCSW 01/19/2017, 4:05 PM

## 2017-01-19 NOTE — Progress Notes (Signed)
Admission Note:  35yr male who presents IVC in no acute distress for the treatment of AH, Depression and Passive Suicide Ideation. Pt appears flat and depressed, thoughts are organized and coherent, able to follow simple commands. Pt was calm and cooperative with admission process. Pt presents with passive SI and contracts for safety upon admission. Pt endorses auditory hallucination bur denies any command hallucination. Pt has past medical Hx of Schizoaffective disorder, Depression, HTN, Asthma and DM. Upon admission patient's CBG was 92. Consents was obtained, Food and fluids offered, and both accepted. Pt had no additional questions or concerns will continue to monitor.

## 2017-01-19 NOTE — ED Notes (Signed)
Behavior Activity: Lying in bed with eyes closed. Appears to be asleep Attitude: Cooperative Mood/Affect: UTA Thought Process: UTA Thought Content: UTA Perceptual Disturbance: UTA Orientation: UTA Hygiene: [name] Meals: [name] Visitors: [name] PRN Medication: [name]  

## 2017-01-19 NOTE — Progress Notes (Signed)
Pt is very anxious and worrying. Pt is worried about medications, pt was educated on his medications. Pt depression is 7/10.  Pt is experiencing passive SI and auditory hallucinations, voices is not telling pt to harm self. Pt blood glucose was 59, pt given a Glucerna and two graham crackers. Pt Novolog was held due to pt low blood sugar. Pt reassessed and blood sugar increased to 172. Pt did receive his Lantus 40 units SQ, given in right posterior upper arm. Pt was compliant with meds, no adverse affects noted. Pt had c/o of right lower side 7/10, Tylenol 650 mg tab po given, pt tolerable pain level is 2/10. Pt reassessed and pt reports no longer experiencing pain. 15 min safety checks continues.

## 2017-01-19 NOTE — Plan of Care (Signed)
Problem: Education: Goal: Ability to make informed decisions regarding treatment will improve Outcome: Progressing Able to verbalize the information about the given medications  Problem: Coping: Goal: Ability to cope will improve Outcome: Not Progressing Not attended group activities.  Problem: Health Behavior/Discharge Planning: Goal: Identification of resources available to assist in meeting health care needs will improve Outcome: Not Progressing Not able to verbalized any goal for discharge.  Problem: Medication: Goal: Compliance with prescribed medication regimen will improve Outcome: Progressing Compliant with medications.  Problem: Self-Concept: Goal: Ability to disclose and discuss suicidal ideas will improve Not attending group programing.  Problem: Safety: Goal: Ability to disclose and discuss suicidal ideas will improve Outcome: Progressing Patient remained free from injury.

## 2017-01-19 NOTE — Plan of Care (Signed)
Problem: Activity: Goal: Imbalance in normal sleep/wake cycle will improve Outcome: Progressing Pt will have 5 hour or more of sleep this shift.

## 2017-01-19 NOTE — [facility] Group Notes (Signed)
[facility] Group Notes:  (Nursing/MHT/Case Management/Adjunct)  Date:  01/19/2017  Time:  2:57 PM  Type of Therapy:  Psychoeducational Skills  Participation Level:  Did Not Attend   Lynelle Smoke [facility] 01/19/2017, 2:57 PM

## 2017-01-19 NOTE — ED Notes (Signed)
Behavior Activity: Lying quietly in bed Attitude: Cooperative Mood/Affect: UTA Thought Process: WNL Thought Content: UTA Perceptual Disturbance: UTA Orientation: UTA Hygiene: [name] Meals: [name] Visitors: [name] PRN Medication: [name]  

## 2017-01-19 NOTE — H&P (Signed)
Psychiatric Admission Assessment Adult  Patient Identification: Tony Cunningham MRN:  [id number] Date of Evaluation:  01/19/2017 Chief Complaint:  schizoaffective disorder bipolar type Principal Diagnosis: Schizoaffective disorder, depressive type (HCC) Diagnosis:   Patient Active Problem List   Diagnosis Date Noted  . 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 Schizoaffective disorder, depressive type (HCC) [F25.1] 09/23/2016  . Polysubstance abuse [F19.10] 07/17/2016  . Tardive dyskinesia [G24.01] 07/16/2016  . Tobacco use disorder [F17.200] 07/07/2016  . Dyslipidemia [E78.5] 07/07/2016  . Asthma [J45.909] 07/07/2016  . HTN (hypertension) [I10] 07/06/2016  . Diabetes (HCC) [E11.9] 12/25/2010   History of Present Illness:   Identifying data. Tony Cunningham is a 41 year old male with history of depression.  Chief complaint. "I have voices."  History of present illness. Information was obtained from the patient. The patient was brought to the emergency room from a new group home where he spent less than 24 hours with complaints of auditory hallucinations commanding him to kill himself and threats to run away from the group home. The patient was just discharged from [facility] psychiatry where he was hospitalized for similar problems July 31 and August 24. The patient reports that he did not improve at all during this extended hospitalization and does not be that his medications have been working. He has been tried on numerous medications over time and has been taking Clozaril 150 mg lately with, reportedly, no improvement. The patient returns to the hospital constantly. She reports poor sleep, decreased appetite with weight loss, anhedonia, feeling of guilt and hopelessness worthlessness. Poor energy and concentration, social isolation, crying spells, auditory hallucinations, heightened anxiety, and constant suicidal thinking. He denies any recent drug or alcohol use.  Past  psychiatric history. There is a long history of schizoaffective disorder with frequent bouts of depression and almost continuous hospitalization this year. Recently he has been tried on Abilify, olanzapine, venlafaxine, trazodone, amantadine, Clozapine, Luvox, Ativan, Valium, Celexa, Restoril, Cogentin. There were attempts to cut himself and kill himself in the past and multiple hospitalization including [place], [place] [address] and [address]  Family psychiatric history. Unknown.  Social history. The patient has 11th grade education. He used to live with his mother in [place] for but recently has been placed in group homes with little success. He stayed less than 24 hours in the last facility.  Total Time spent with patient: 1 hour  Is the patient at risk to self? Yes.    Has the patient been a risk to self in the past 6 months? Yes.    Has the patient been a risk to self within the distant past? Yes.    Is the patient a risk to others? No.  Has the patient been a risk to others in the past 6 months? No.  Has the patient been a risk to others within the distant past? No.   Prior Inpatient Therapy:   Prior Outpatient Therapy:    Alcohol Screening: 1. How often do you have a drink containing alcohol?: Never 3. How often do you have six or more drinks on one occasion?: Never 4. How often during the last year have you found that you were not able to stop drinking once you had started?: Never 5. How often during the last year have you failed to do what was normally expected from you becasue of drinking?: Never 6. How often during the last year have you needed a first drink in the morning to get yourself going after a heavy drinking session?: Never  7. How often during the last year have you had a feeling of guilt of remorse after drinking?: Never 8. How often during the last year have you been unable to remember what happened the night before because you had been drinking?: Never 9. Have you or  someone else been injured as a result of your drinking?: No 10. Has a relative or friend or a doctor or another health worker been concerned about your drinking or suggested you cut down?: No Alcohol Use Disorder Identification Test Final Score (AUDIT): 0 Brief Intervention: AUDIT score less than 7 or less-screening does not suggest unhealthy drinking-brief intervention not indicated Substance Abuse History in the last 12 months:  No. Consequences of Substance Abuse: NA Previous Psychotropic Medications: Yes  Psychological Evaluations: No  Past Medical History:  Past Medical History:  Diagnosis Date  . Anxiety   . Asthma   . Diabetes mellitus   . High blood pressure   . Sinus complaint    History reviewed. No pertinent surgical history. Family History: History reviewed. No pertinent family history.   Tobacco Screening: Have you used any form of tobacco in the last 30 days? (Cigarettes, Smokeless Tobacco, Cigars, and/or Pipes): Yes Tobacco use, Select all that apply: 4 or less cigarettes per day Are you interested in Tobacco Cessation Medications?: Yes, will notify MD for an order Counseled patient on smoking cessation including recognizing danger situations, developing coping skills and basic information about quitting provided: Yes Social History:  History  Alcohol Use No     History  Drug Use No    Additional Social History:                           Allergies:  No Known Allergies Lab Results:  Results for orders placed or performed during the hospital encounter of 01/19/17 (from the past 48 hour(s))  Glucose, capillary     Status: None   Collection Time: 01/19/17  4:13 AM  Result Value Ref Range   Glucose-Capillary 92 65 - 99 mg/dL  Hemoglobin K9T     Status: Abnormal   Collection Time: 01/19/17  6:37 AM  Result Value Ref Range   Hgb A1c MFr Bld 7.6 (H) 4.8 - 5.6 %    Comment: (NOTE) Pre diabetes:          5.7%-6.4% Diabetes:              >6.4% Glycemic  control for   <7.0% adults with diabetes    Mean Plasma Glucose 171.42 mg/dL    Comment: Performed at [facility], [name] [place], [place] [address]  Lipid panel     Status: Abnormal   Collection Time: 01/19/17  6:37 AM  Result Value Ref Range   Cholesterol 122 0 - 200 mg/dL   Triglycerides 471 (H) <150 mg/dL   HDL 36 (L) >59 mg/dL   Total CHOL/HDL Ratio 3.4 RATIO   VLDL 31 0 - 40 mg/dL   LDL Cholesterol 55 0 - 99 mg/dL    Comment:        Total Cholesterol/HDL:CHD Risk Coronary Heart Disease Risk Table                     Men   Women  1/2 Average Risk   3.4   3.3  Average Risk       5.0   4.4  2 X Average Risk   9.6  7.1  3 X Average Risk  23.4   11.0        Use the calculated Patient Ratio above and the CHD Risk Table to determine the patient's CHD Risk.        ATP III CLASSIFICATION (LDL):  <100     mg/dL   Optimal  244-010  mg/dL   Near or Above                    Optimal  130-159  mg/dL   Borderline  272-536  mg/dL   High  >644     mg/dL   Very High   TSH     Status: None   Collection Time: 01/19/17  6:37 AM  Result Value Ref Range   TSH 4.415 0.350 - 4.500 uIU/mL    Comment: Performed by a 3rd Generation assay with a functional sensitivity of <=0.01 uIU/mL.  Glucose, capillary     Status: Abnormal   Collection Time: 01/19/17  7:19 AM  Result Value Ref Range   Glucose-Capillary 339 (H) 65 - 99 mg/dL   Comment 1 Notify RN     Blood Alcohol level:  Lab Results  Component Value Date   ETH <5 01/16/2017   ETH <5 12/21/2016    Metabolic Disorder Labs:  Lab Results  Component Value Date   HGBA1C 7.6 (H) 01/19/2017   MPG 171.42 01/19/2017   MPG 249 11/09/2016   Lab Results  Component Value Date   PROLACTIN 24.5 (H) 09/24/2016   PROLACTIN 3.4 (L) 07/07/2016   Lab Results  Component Value Date   CHOL 122 01/19/2017   TRIG 154 (H) 01/19/2017   HDL 36 (L) 01/19/2017   CHOLHDL 3.4 01/19/2017   VLDL 31 01/19/2017   LDLCALC 55 01/19/2017    LDLCALC 59 12/28/2016    Current Medications: Current Facility-Administered Medications  Medication Dose Route Frequency Provider Last Rate Last Dose  . acetaminophen (TYLENOL) tablet 650 mg  650 mg Oral Q6H PRN [name], [name], MD      . alum & mag hydroxide-simeth (MAALOX/MYLANTA) 200-200-20 MG/5ML suspension 30 mL  30 mL Oral Q4H PRN [name], [name], MD      . citalopram (CELEXA) tablet 20 mg  20 mg Oral Daily [name], [name], MD   20 mg at 01/19/17 0745  . cloZAPine (CLOZARIL) tablet 150 mg  150 mg Oral QHS [name], [name], MD      . insulin aspart (novoLOG) injection 0-5 Units  0-5 Units Subcutaneous QHS [name], [name], MD      . insulin aspart (novoLOG) injection 0-9 Units  0-9 Units Subcutaneous TID WC [name], [name], MD   7 Units at 01/19/17 [phone]  . insulin aspart (novoLOG) injection 8 Units  8 Units Subcutaneous TID WC [name], [name], MD   8 Units at 01/19/17 0745  . insulin glargine (LANTUS) injection 40 Units  40 Units Subcutaneous QHS [name], [name], MD      . lisinopril (PRINIVIL,ZESTRIL) tablet 20 mg  20 mg Oral Daily [name], [name], MD   20 mg at 01/19/17 0745  . magnesium hydroxide (MILK OF MAGNESIA) suspension 30 mL  30 mL Oral Daily PRN [name], [name], MD      . metFORMIN (GLUCOPHAGE) tablet 1,000 mg  1,000 mg Oral BID WC [name], [name], MD   1,000 mg at 01/19/17 0745  . montelukast (SINGULAIR) tablet 10 mg  10 mg Oral QHS [name], [name], MD      .  nicotine (NICODERM CQ - dosed in mg/24 hours) patch 21 mg  21 mg Transdermal Daily Estevon Fluke B, MD      . OLANZapine (ZYPREXA) tablet 10 mg  10 mg Oral QHS [name], [name], MD      . pantoprazole (PROTONIX) EC tablet 40 mg  40 mg Oral Daily [name], [name], MD   40 mg at 01/19/17 0745  . propranolol (INDERAL) tablet 20 mg  20 mg Oral BID [name], [name], MD   20 mg at 01/19/17 0745  . temazepam (RESTORIL) capsule 7.5 mg  7.5 mg Oral QHS [name], [name], MD       PTA Medications: Prescriptions Prior to  Admission  Medication Sig Dispense Refill Last Dose  . amantadine (SYMMETREL) 100 MG capsule Take 1 capsule (100 mg total) by mouth 2 (two) times daily. For prevention of drug induced tremors 60 capsule 0 Unknown at Unknown  . benztropine (COGENTIN) 1 MG tablet Take 1 tablet (1 mg total) by mouth 2 (two) times daily. For prevention of drug induced tremors 60 tablet 0 Unknown at Unknown  . cholecalciferol 400 units tablet Take 1 tablet (400 Units total) by mouth daily. For bone health 30 each 0 Unknown at Unknown  . citalopram (CELEXA) 20 MG tablet Take 1 tablet (20 mg total) by mouth daily. For depression 30 tablet 0 Unknown at Unknown  . cloZAPine (CLOZARIL) 50 MG tablet Take 3 tablets (150 mg total) by mouth at bedtime. For mood control 90 tablet 0 Unknown at Unknown  . cyclobenzaprine (FLEXERIL) 5 MG tablet Take 1 tablet (5 mg total) by mouth 3 (three) times daily as needed for muscle spasms. 15 tablet 0 Unknown at Unknown  . diclofenac sodium (VOLTAREN) 1 % GEL Apply 2 g topically 4 (four) times daily. For arthritis pain   Unknown at Unknown  . insulin aspart (NOVOLOG) 100 UNIT/ML injection Inject 6 Units into the skin 3 (three) times daily with meals. 10 mL 11 Unknown at Unknown  . insulin glargine (LANTUS) 100 UNIT/ML injection Inject 0.45 mLs (45 Units total) into the skin at bedtime. For diabetic management 10 mL 0   . insulin glargine (LANTUS) 100 UNIT/ML injection Inject 0.4 mLs (40 Units total) into the skin at bedtime. 10 mL 11   . lisinopril (PRINIVIL,ZESTRIL) 20 MG tablet Take 1 tablet (20 mg total) by mouth daily. For high blood pressure 30 tablet 0 Unknown at Unknown  . metFORMIN (GLUCOPHAGE) 1000 MG tablet Take 1 tablet (1,000 mg total) by mouth 2 (two) times daily with a meal. For diabetes management 60 tablet 0 Unknown at Unknown  . montelukast (SINGULAIR) 10 MG tablet Take 1 tablet (10 mg total) by mouth at bedtime. For shortness of breath 30 tablet 0 Unknown at Unknown  . nicotine  polacrilex (NICORETTE) 2 MG gum Take 1 each (2 mg total) by mouth as needed for smoking cessation. 100 tablet 0 Unknown at Unknown  . pantoprazole (PROTONIX) 40 MG tablet Take 1 tablet (40 mg total) by mouth daily. For acid reflux 30 tablet 0 Unknown at Unknown  . polyethylene glycol (MIRALAX / GLYCOLAX) packet Take 17 g by mouth daily. For constipation 14 each 0 Unknown at Unknown  . propranolol (INDERAL) 20 MG tablet Take 1 tablet (20 mg total) by mouth 2 (two) times daily. For high blood pressure 60 tablet 0 Unknown at Unknown  . simvastatin (ZOCOR) 20 MG tablet Take 1 tablet (20 mg total) by mouth daily at 6 PM. For  high Cholesterol 30 tablet 0 Unknown at Unknown  . temazepam (RESTORIL) 7.5 MG capsule Take 1 capsule (7.5 mg total) by mouth at bedtime. For sleep 14 capsule 0 Unknown at Unknown    Musculoskeletal: Strength & Muscle Tone: decreased Gait & Station: normal Patient leans: [name]  Psychiatric Specialty Exam: Physical Exam  Nursing note and vitals reviewed. Constitutional: He is oriented to person, place, and time. He appears well-developed and well-nourished.  HENT:  Head: Atraumatic.  Eyes: Pupils are equal, round, and reactive to light. Conjunctivae and EOM are normal.  Neck: Normal range of motion. Neck supple.  Cardiovascular: Normal rate, regular rhythm and normal heart sounds.   Respiratory: Effort normal and breath sounds normal.  GI: Soft. Bowel sounds are normal.  Musculoskeletal: Normal range of motion.  Neurological: He is alert and oriented to person, place, and time.  Skin: Skin is warm and dry.  Psychiatric: His affect is blunt. His speech is delayed. He is slowed, withdrawn and actively hallucinating. Cognition and memory are impaired. He expresses impulsivity. He expresses suicidal ideation. He expresses suicidal plans.    Review of Systems  Constitutional: Positive for malaise/fatigue and weight loss.  Neurological: Positive for weakness.   Psychiatric/Behavioral: Positive for depression, hallucinations and suicidal ideas. The patient is nervous/anxious and has insomnia.   All other systems reviewed and are negative.   Blood pressure 110/77, pulse 90, temperature 98.2 F (36.8 C), temperature source Oral, resp. rate 18, height 6' (1.829 m), weight 78.5 kg (173 lb), SpO2 100 %.Body mass index is 23.46 kg/m.  See SRA.                                                  Sleep:  Number of Hours: 0.5    Treatment Plan Summary: Daily contact with patient to assess and evaluate symptoms and progress in treatment and Medication management   Mr. Samson is a 41 year old male with history of depression who has been hospitalized almost continuously per month and was discharged from another facility less than 24 hours prior to admission, returns to the hospital with auditory hallucinations and suicidal ideation.  1. Suicidal ideation. The patient is able to contract for safety in the hospital.  2. Mood and psychosis. We will continue Clozaril. Zyprexa was added to his regimen for psychosis and Celexa for depression.   3.  Diabetes. He is on metformin, Lantus and NovoLog, Metformin, ADA diet, sliding scale insulin, and blood glucose monitoring. In both for diabetes mellitus coordinators is greatly appreciated.   4. GERD. He is on Protonix.  5. Hypertension. He's on lisinopril and propranolol.  6. Smoking. He is on nicotine patch.  7. Insomnia. He is on Restoril. Slept 0.5 hour only but was admitted in the middle of the night.  8. Metabolic syndrome monitoring. Wants were obtained during his recent hospitalization.  9. EKG. Pending.   10. disposition. The patient apparently is not allowed to return to the group home he came from in less than 24 hours.    Observation Level/Precautions:  15 minute checks  Laboratory:  CBC Chemistry Profile UDS UA  Psychotherapy:    Medications:    Consultations:     Discharge Concerns:    Estimated LOS:  Other:     Physician Treatment Plan for Primary Diagnosis: Schizoaffective disorder, depressive type (HCC) Long Term Goal(s): Improvement  in symptoms so as ready for discharge  Short Term Goals: Ability to identify changes in lifestyle to reduce recurrence of condition will improve, Ability to verbalize feelings will improve, Ability to disclose and discuss suicidal ideas, Ability to demonstrate self-control will improve, Ability to identify and develop effective coping behaviors will improve, Ability to maintain clinical measurements within normal limits will improve and Ability to identify triggers associated with substance abuse/mental health issues will improve  Physician Treatment Plan for Secondary Diagnosis: Principal Problem:   Schizoaffective disorder, depressive type (HCC) Active Problems:   Diabetes (HCC)   HTN (hypertension)   Tobacco use disorder   Dyslipidemia   Tardive dyskinesia  Long Term Goal(s): NA  Short Term Goals: NA  I certify that inpatient services furnished can reasonably be expected to improve the patient's condition.    Kristine Linea, MD 8/28/20182:36 PM

## 2017-01-19 NOTE — Tx Team (Signed)
Initial Treatment Plan 01/19/2017 4:56 AM [name] FHL:[id number]    PATIENT STRESSORS: Health problems Medication change or noncompliance   PATIENT STRENGTHS: Active sense of humor Motivation for treatment/growth   PATIENT IDENTIFIED PROBLEMS: Schizoaffective disorder     Suicidal ideation     Depression              DISCHARGE CRITERIA:  Adequate post-discharge living arrangements Improved stabilization in mood, thinking, and/or behavior Motivation to continue treatment in a less acute level of care  PRELIMINARY DISCHARGE PLAN: Outpatient therapy  PATIENT/FAMILY INVOLVEMENT: This treatment plan has been presented to and reviewed with the patient, Tony Cunningham,The patient and family have been given the opportunity to ask questions and make suggestions.  Trula Ore, RN 01/19/2017, 4:56 AM

## 2017-01-19 NOTE — [facility] Suicide Risk Assessment (Signed)
Tony Cunningham, Tony Cunningham Admission Suicide Risk Assessment   Nursing information obtained from:    Demographic factors:    Current Mental Status:    Loss Factors:    Historical Factors:    Risk Reduction Factors:     Total Time spent with patient: 1 hour Principal Problem: Schizoaffective disorder, depressive type (HCC) Diagnosis:   Patient Active Problem List   Diagnosis Date Noted  . 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 Schizoaffective disorder, depressive type (HCC) [F25.1] 09/23/2016  . Polysubstance abuse [F19.10] 07/17/2016  . Tardive dyskinesia [G24.01] 07/16/2016  . Tobacco use disorder [F17.200] 07/07/2016  . Dyslipidemia [E78.5] 07/07/2016  . Asthma [J45.909] 07/07/2016  . HTN (hypertension) [I10] 07/06/2016  . Diabetes (HCC) [E11.9] 12/25/2010   Subjective Data: hallucinations, suicidal ideation.  Continued Clinical Symptoms:  Alcohol Use Disorder Identification Test Final Score (AUDIT): 0 The "Alcohol Use Disorders Identification Test", Guidelines for Use in Primary Care, Second Edition.  World Science writer [facility]). Score between 0-7:  no or low risk or alcohol related problems. Score between 8-15:  moderate risk of alcohol related problems. Score between 16-19:  high risk of alcohol related problems. Score 20 or above:  warrants further diagnostic evaluation for alcohol dependence and treatment.   CLINICAL FACTORS:   Depression:   Impulsivity Insomnia Currently Psychotic   Musculoskeletal: Strength & Muscle Tone: decreased Gait & Station: normal Patient leans: [name]  Psychiatric Specialty Exam: Physical Exam  Nursing note and vitals reviewed. Psychiatric: His affect is blunt. His speech is delayed. He is slowed, withdrawn and actively hallucinating. Cognition and memory are impaired. He expresses impulsivity. He expresses suicidal ideation. He expresses suicidal plans.    Review of Systems  Constitutional: Positive for weight loss.   Neurological: Positive for weakness.  Psychiatric/Behavioral: Positive for depression, hallucinations and suicidal ideas. The patient is nervous/anxious and has insomnia.   All other systems reviewed and are negative.   Blood pressure 110/77, pulse 90, temperature 98.2 F (36.8 C), temperature source Oral, resp. rate 18, height 6' (1.829 m), weight 78.5 kg (173 lb), SpO2 100 %.Body mass index is 23.46 kg/m.  General Appearance: Disheveled  Eye Contact:  Poor  Speech:  Slow  Volume:  Decreased  Mood:  Depressed and Hopeless  Affect:  Blunt  Thought Process:  Goal Directed and Descriptions of Associations: Intact  Orientation:  Full (Time, Place, and Person)  Thought Content:  Hallucinations: Auditory  Suicidal Thoughts:  Yes.  with intent/plan  Homicidal Thoughts:  No  Memory:  Immediate;   Fair Recent;   Fair Remote;   Fair  Judgement:  Poor  Insight:  Lacking  Psychomotor Activity:  Psychomotor Retardation  Concentration:  Concentration: Fair and Attention Span: Fair  Recall:  [employer] of Knowledge:  Fair  Language:  Fair  Akathisia:  No  Handed:  Right  AIMS (if indicated):     Assets:  Communication Skills Desire for Improvement Financial Resources/Insurance Resilience  ADL's:  Intact  Cognition:  WNL  Sleep:  Number of Hours: 0.5      COGNITIVE FEATURES THAT CONTRIBUTE TO RISK:  None    SUICIDE RISK:   Severe:  Frequent, intense, and enduring suicidal ideation, specific plan, no subjective intent, but some objective markers of intent (i.e., choice of lethal method), the method is accessible, some limited preparatory behavior, evidence of impaired self-control, severe dysphoria/symptomatology, multiple risk factors present, and few if any protective factors, particularly a lack of social support.  PLAN OF CARE: Hospital admission, medication  management, discharge planning.  Tony Cunningham is a 41 year old male with history of depression who has been hospitalized  almost continuously per month and was discharged from another facility less than 24 hours prior to admission, returns to the hospital with auditory hallucinations and suicidal ideation.  1. Suicidal ideation. The patient is able to contract for safety in the hospital.  2. Mood and psychosis. We will continue Clozaril. Zyprexa was added to his regimen for psychosis and Celexa for depression.   3.  Diabetes. He is on metformin, Lantus and NovoLog, Metformin, ADA diet, sliding scale insulin, and blood glucose monitoring. In both for diabetes mellitus coordinators is greatly appreciated.   4. GERD. He is on Protonix.  5. Hypertension. He's on lisinopril and propranolol.  6. Smoking. He is on nicotine patch.  7. Insomnia. He is on Restoril.  8. Metabolic syndrome monitoring. Wants were obtained during his recent hospitalization.  9. EKG. Pending.   10. Disposition. The patient apparently is not allowed to return to the group home he came from in less than 24 hours.   I certify that inpatient services furnished can reasonably be expected to improve the patient's condition.   Kristine Linea, MD 01/19/2017, 2:26 PM

## 2017-01-19 NOTE — Progress Notes (Signed)
Recreation Therapy Notes  Date: 08.28.18 Time: 9:30 am Location: Craft Room  Group Topic: Goal Setting  Goal Area(s) Addresses:  Patient will be able to identify a personal goal. Patient will verbalize benefit of setting goals.  Behavioral Response: Did not attend  Intervention: Step By Step  Activity: Patients were given a worksheet with a foot on it and were instructed to write a goal towards their recovery inside the foot and positive comments outside of the foot.  Education: LRT educated patients on how to celebrate achieving their goals in a healthy way.  Education Outcome: Patient did not attend group.   Clinical Observations/Feedback: Patient did not attend group.  Jacquelynn Cree, LRT/CTRS 01/19/2017 10:05 AM

## 2017-01-19 NOTE — ED Notes (Signed)
Behavior Activity: Lying quietly in bed Attitude: Cooperative Mood/Affect: UTA Thought Process: WNL Thought Content: UTA Perceptual Disturbance: UTA Orientation: UTA Hygiene: [name] Meals: [name] Visitors: [name] PRN Medication: [name]

## 2017-01-19 NOTE — Progress Notes (Signed)
Patient stayed in bed all day.Verbalized passive suicidal thoughts and positive for hearing voices.Patient contracts for safety.Appropriate with staff & peers.Compliant with medications.Did not attend groups.Support & encouragement given.

## 2017-01-19 NOTE — Progress Notes (Signed)
Recreation Therapy Notes  At approximately 10:30 am, LRT attempted assessment. Patient refused stating he did not want to talk.  Jacquelynn Cree, LRT/CTRS 01/19/2017 10:53 AM

## 2017-01-20 LAB — GLUCOSE, CAPILLARY
GLUCOSE-CAPILLARY: 123 mg/dL — AB (ref 65–99)
GLUCOSE-CAPILLARY: 108 mg/dL — AB (ref 65–99)
GLUCOSE-CAPILLARY: 147 mg/dL — AB (ref 65–99)
Glucose-Capillary: 54 mg/dL — ABNORMAL LOW (ref 65–99)
Glucose-Capillary: 76 mg/dL (ref 65–99)
Glucose-Capillary: 111 mg/dL — ABNORMAL HIGH (ref 65–99)

## 2017-01-20 MED ORDER — INSULIN ASPART 100 UNIT/ML ~~LOC~~ SOLN
5.0000 [IU] | Freq: Three times a day (TID) | SUBCUTANEOUS | Status: DC
  Administered 2017-01-20: 5 [IU] via SUBCUTANEOUS

## 2017-01-20 MED ORDER — INSULIN ASPART 100 UNIT/ML ~~LOC~~ SOLN
3.0000 [IU] | Freq: Three times a day (TID) | SUBCUTANEOUS | Status: DC
  Administered 2017-01-21 – 2017-01-28 (×20): 3 [IU] via SUBCUTANEOUS
  Filled 2017-01-20 (×7): qty 1

## 2017-01-20 MED ORDER — INSULIN GLARGINE 100 UNIT/ML ~~LOC~~ SOLN
35.0000 [IU] | Freq: Every day | SUBCUTANEOUS | Status: DC
  Administered 2017-01-20 – 2017-02-08 (×20): 35 [IU] via SUBCUTANEOUS
  Filled 2017-01-20 (×23): qty 0.35

## 2017-01-20 NOTE — [facility] Group Notes (Signed)
[facility] Group Notes:  (Nursing/MHT/Case Management/Adjunct)  Date:  01/20/2017  Time:  5:40 PM  Type of Therapy:  Psychoeducational Skills  Participation Level:  Minimal  Participation Quality:  Appropriate  Affect:  Appropriate  Cognitive:  Appropriate  Insight:  Appropriate  Engagement in Group:  Engaged  Modes of Intervention:  Socialization  Summary of Progress/Problems:  [facility] 01/20/2017, 5:40 PM

## 2017-01-20 NOTE — [facility] Group Notes (Signed)
[facility] Group Notes:  (Nursing/MHT/Case Management/Adjunct)  Date:  01/20/2017  Time:  9:49 PM  Type of Therapy:  Group Therapy  Participation Level:  Active  Participation Quality:  Appropriate  Affect:  Appropriate  Cognitive:  Alert  Insight:  Good  Engagement in Group:  Engaged  Modes of Intervention:  Support  Summary of Progress/Problems:  [facility] 01/20/2017, 9:49 PM

## 2017-01-20 NOTE — Progress Notes (Signed)
Patient is alert and oriented to person, place and time. Skin is warm, dry and intact. No limitations to all four extremities noted. Patient denies SI/HI, AVH at this time. Patient was observed ambulating in hall during the shift with a steady gait. Attends meals with frequent encouragement from staff, minimal peer interaction noted. Observed resting in be with eyes closed this morning. Blood sugar and blood pressure monitored and medicated per protocol. Milieu remains therapeutic. Patient will be monitored and physician notified of any acute changes.

## 2017-01-20 NOTE — Plan of Care (Signed)
Problem: Safety: Goal: Ability to disclose and discuss suicidal ideas will improve Outcome: Progressing Patient denies SI to staff  Problem: Self-Concept: Goal: Ability to verbalize positive feelings about self will improve Outcome: Not Progressing Patient can not demonstrate the ability to verbalize positive feelings about self at this time.  Problem: Activity: Goal: Interest or engagement in activities will improve Outcome: Not Progressing Patient does not show interest in activities  Problem: Education: Goal: Emotional status will improve Outcome: Progressing Patient's emotional status has not improved  Problem: Safety: Goal: Periods of time without injury will increase Outcome: Progressing Patient remains safe on the unit

## 2017-01-20 NOTE — Progress Notes (Signed)
Patient's BS now 111, denies any symptoms of hypoglycemia. Verbalized passive SI and verbally contracts for safety. States, "I can barely hear the voices right now."

## 2017-01-20 NOTE — Progress Notes (Signed)
Patient's BS 38 this afternoon, recheck < one minute with results reading 54. Patient encouraged to eat lunch, then rechecked. BS reading 76 after lunch. MD present on the unit notified of event, per Dr. Ardyth Harps, recheck after he eats his lunch. Dr. Toni Amend paged and was told to hold noon dose of Novolog. Patient, tremulous and complained of feeling weak. Will continue to monitor.

## 2017-01-20 NOTE — Progress Notes (Signed)
Recreation Therapy Notes  Date: 08.29.18 Time: 9:30 am Location: Craft Room  Group Topic: Self-esteem  Goal Area(s) Addresses:  Patient will identify positive traits about self. Patient will identify at least one coping skill.  Behavioral Response: Did not attend  Intervention: All About Me  Activity: Patients were instructed to make an All About Me pamphlet including their life motto, positive traits about themselves, healthy coping skills, and their healthy support system.  Education: LRT educated patients on ways to increase their self-esteem.  Education Outcome: Patient did not attend group.  Clinical Observations/Feedback: Patient did not attend group.  Jacquelynn Cree, LRT/CTRS 01/20/2017 10:06 AM

## 2017-01-20 NOTE — Progress Notes (Signed)
Inpatient Diabetes Program Recommendations  AACE/ADA: New Consensus Statement on Inpatient Glycemic Control (2015)  Target Ranges:  Prepandial:   less than 140 mg/dL      Peak postprandial:   less than 180 mg/dL (1-2 hours)      Critically ill patients:  140 - 180 mg/dL   Lab Results  Component Value Date   GLUCAP 123 (H) 01/20/2017   HGBA1C 7.6 (H) 01/19/2017    Review of Glycemic ControlResults for STANISLAW, ACTON (MRN [id number]) as of 01/20/2017 08:41  Ref. Range 01/19/2017 07:19 01/19/2017 11:42 01/19/2017 16:43 01/19/2017 20:52 01/19/2017 23:08 01/20/2017 07:06  Glucose-Capillary Latest Ref Range: 65 - 99 mg/dL 572 (H) 66 620 (H) 59 (L) 172 (H) 123 (H)   Outpatient Diabetes medications: Lantus 55 units QHS Novolog 10 units TID with meals Metformin 1000 mg BID  Current Insulin Orders: Lantus 40 units QHS                                  Novolog 8units TID meal coverage Metformin 1000 mg BID                                        Novolog sensitive tid with meals and HS  Inpatient Diabetes Program Recommendations:    Note mild hypoglycemia on 8/28- Fasting Blood sugar improved.  Consider reduction of Novolog meal coverage to 5 units tid with meals (hold if patient eats less than 50%).  Called and discussed with MD, Orders received.    Thanks, Beryl Meager, RN, BC-ADM Inpatient Diabetes Coordinator Pager [phone] (8a-5p)

## 2017-01-20 NOTE — Progress Notes (Signed)
[facility] MD Progress Note  01/20/2017 8:05 PM [facility]  MRN:  [id number] Subjective:  41 year old man with a history of schizoaffective disorder who came back into the [facility] only shortly after his most recent discharge this time reporting hallucinations and suicidal ideation. On evaluation today patient is in bed staying withdrawn and hardly ever attending groups. He tells me his mood is very sad and feels frightened. He says he's having auditory hallucinations and active wishes that he were dead. He is not acting on any of these and is not aggressive. Patient has been compliant with medication. He admits to me that he really would rather stay in the [facility] forever and does not really ever want to be placed in a group home or any other kind of even semi-independent living situation. Principal Problem: Schizoaffective disorder, depressive type (HCC) Diagnosis:   Patient Active Problem List   Diagnosis Date Noted  . 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 Schizoaffective disorder, depressive type (HCC) [F25.1] 09/23/2016  . Polysubstance abuse [F19.10] 07/17/2016  . Tardive dyskinesia [G24.01] 07/16/2016  . Tobacco use disorder [F17.200] 07/07/2016  . Dyslipidemia [E78.5] 07/07/2016  . Asthma [J45.909] 07/07/2016  . HTN (hypertension) [I10] 07/06/2016  . Diabetes (HCC) [E11.9] 12/25/2010   Total Time spent with patient: 30 minutes  Past Psychiatric History: Patient has a long-standing history of mental illness but over the last year or so seems to of decompensated more than usual and has had one hospitalization after another. He did show some response to ECT and antipsychotic medication but seems to decompensate immediately whenever he is discharged.  Past Medical History:  Past Medical History:  Diagnosis Date  . Anxiety   . Asthma   . Diabetes mellitus   . High blood pressure   . Sinus complaint    History reviewed. No pertinent surgical history. Family  History: History reviewed. No pertinent family history. Family Psychiatric  History: Unknown Social History:  History  Alcohol Use No     History  Drug Use No    Social History   Social History  . Marital status: Single    Spouse name: [name]  . Number of children: [name]  . Years of education: [name]   Social History Main Topics  . Smoking status: Current Every Day Smoker    Packs/day: 0.50    Types: Cigarettes  . Smokeless tobacco: Never Used  . Alcohol use No  . Drug use: No  . Sexual activity: Not Currently   Other Topics Concern  . None   Social History Narrative  . None   Additional Social History:                         Sleep: Fair  Appetite:  Poor  Current Medications: Current Facility-Administered Medications  Medication Dose Route Frequency Provider Last Rate Last Dose  . acetaminophen (TYLENOL) tablet 650 mg  650 mg Oral Q6H PRN [name], [name], MD      . alum & mag hydroxide-simeth (MAALOX/MYLANTA) 200-200-20 MG/5ML suspension 30 mL  30 mL Oral Q4H PRN [name], [name], MD      . citalopram (CELEXA) tablet 20 mg  20 mg Oral Daily [name], [name], MD   20 mg at 01/20/17 0801  . cloZAPine (CLOZARIL) tablet 150 mg  150 mg Oral QHS [name], [name], MD   150 mg at 01/19/17 2125  . insulin aspart (novoLOG) injection 0-5 Units  0-5 Units Subcutaneous QHS [name], John  T, MD      . insulin aspart (novoLOG) injection 0-9 Units  0-9 Units Subcutaneous TID WC [name], [name], MD   Stopped at 01/20/17 1224  . [START ON 01/21/2017] insulin aspart (novoLOG) injection 3 Units  3 Units Subcutaneous TID WC [name], [name], MD      . insulin glargine (LANTUS) injection 35 Units  35 Units Subcutaneous QHS [name], [name], MD      . lisinopril (PRINIVIL,ZESTRIL) tablet 20 mg  20 mg Oral Daily [name], [name], MD   20 mg at 01/20/17 0801  . magnesium hydroxide (MILK OF MAGNESIA) suspension 30 mL  30 mL Oral Daily PRN [name], [name], MD      . metFORMIN (GLUCOPHAGE) tablet  1,000 mg  1,000 mg Oral BID WC [name], [name], MD   1,000 mg at 01/20/17 1628  . montelukast (SINGULAIR) tablet 10 mg  10 mg Oral QHS [name], [name], MD   10 mg at 01/19/17 2313  . nicotine (NICODERM CQ - dosed in mg/24 hours) patch 21 mg  21 mg Transdermal Daily [name], [name], MD      . OLANZapine (ZYPREXA) tablet 10 mg  10 mg Oral QHS [name], [name], MD   10 mg at 01/19/17 2125  . pantoprazole (PROTONIX) EC tablet 40 mg  40 mg Oral Daily [name], [name], MD   40 mg at 01/20/17 0801  . propranolol (INDERAL) tablet 20 mg  20 mg Oral BID [name], [name], MD   20 mg at 01/20/17 1628  . temazepam (RESTORIL) capsule 7.5 mg  7.5 mg Oral QHS [name], [name], MD   7.5 mg at 01/19/17 2311    Lab Results:  Results for orders placed or performed during the [facility] encounter of 01/19/17 (from the past 48 hour(s))  Glucose, capillary     Status: None   Collection Time: 01/19/17  4:13 AM  Result Value Ref Range   Glucose-Capillary 92 65 - 99 mg/dL  Hemoglobin M7E     Status: Abnormal   Collection Time: 01/19/17  6:37 AM  Result Value Ref Range   Hgb A1c MFr Bld 7.6 (H) 4.8 - 5.6 %    Comment: (NOTE) Pre diabetes:          5.7%-6.4% Diabetes:              >6.4% Glycemic control for   <7.0% adults with diabetes    Mean Plasma Glucose 171.42 mg/dL    Comment: Performed at [facility], [address], [place], [place] [address]  Lipid panel     Status: Abnormal   Collection Time: 01/19/17  6:37 AM  Result Value Ref Range   Cholesterol 122 0 - 200 mg/dL   Triglycerides 709 (H) <150 mg/dL   HDL 36 (L) >62 mg/dL   Total CHOL/HDL Ratio 3.4 RATIO   VLDL 31 0 - 40 mg/dL   LDL Cholesterol 55 0 - 99 mg/dL    Comment:        Total Cholesterol/HDL:CHD Risk Coronary Heart Disease Risk Table                     Men   Women  1/2 Average Risk   3.4   3.3  Average Risk       5.0   4.4  2 X Average Risk   9.6   7.1  3 X Average Risk  23.4   11.0  Use the calculated Patient  Ratio above and the CHD Risk Table to determine the patient's CHD Risk.        ATP III CLASSIFICATION (LDL):  <100     mg/dL   Optimal  629-528  mg/dL   Near or Above                    Optimal  130-159  mg/dL   Borderline  413-244  mg/dL   High  >010     mg/dL   Very High   TSH     Status: None   Collection Time: 01/19/17  6:37 AM  Result Value Ref Range   TSH 4.415 0.350 - 4.500 uIU/mL    Comment: Performed by a 3rd Generation assay with a functional sensitivity of <=0.01 uIU/mL.  Glucose, capillary     Status: Abnormal   Collection Time: 01/19/17  7:19 AM  Result Value Ref Range   Glucose-Capillary 339 (H) 65 - 99 mg/dL   Comment 1 Notify RN   Glucose, capillary     Status: None   Collection Time: 01/19/17 11:42 AM  Result Value Ref Range   Glucose-Capillary 66 65 - 99 mg/dL   Comment 1 Notify RN   Glucose, capillary     Status: Abnormal   Collection Time: 01/19/17  4:43 PM  Result Value Ref Range   Glucose-Capillary 118 (H) 65 - 99 mg/dL   Comment 1 Notify RN   Glucose, capillary     Status: Abnormal   Collection Time: 01/19/17  8:52 PM  Result Value Ref Range   Glucose-Capillary 59 (L) 65 - 99 mg/dL  Glucose, capillary     Status: Abnormal   Collection Time: 01/19/17 11:08 PM  Result Value Ref Range   Glucose-Capillary 172 (H) 65 - 99 mg/dL   Comment 1 Notify RN    Comment 2 Document in Chart   Glucose, capillary     Status: Abnormal   Collection Time: 01/20/17  7:06 AM  Result Value Ref Range   Glucose-Capillary 123 (H) 65 - 99 mg/dL   Comment 1 Notify RN   Glucose, capillary     Status: Abnormal   Collection Time: 01/20/17 11:37 AM  Result Value Ref Range   Glucose-Capillary 38 (LL) 65 - 99 mg/dL   Comment 1 Repeat Test   Glucose, capillary     Status: Abnormal   Collection Time: 01/20/17 11:41 AM  Result Value Ref Range   Glucose-Capillary 54 (L) 65 - 99 mg/dL  Glucose, capillary     Status: None   Collection Time: 01/20/17 12:05 PM  Result Value Ref  Range   Glucose-Capillary 76 65 - 99 mg/dL  Glucose, capillary     Status: Abnormal   Collection Time: 01/20/17 12:34 PM  Result Value Ref Range   Glucose-Capillary 111 (H) 65 - 99 mg/dL  Glucose, capillary     Status: Abnormal   Collection Time: 01/20/17  4:24 PM  Result Value Ref Range   Glucose-Capillary 108 (H) 65 - 99 mg/dL    Blood Alcohol level:  Lab Results  Component Value Date   ETH <5 01/16/2017   ETH <5 12/21/2016    Metabolic Disorder Labs: Lab Results  Component Value Date   HGBA1C 7.6 (H) 01/19/2017   MPG 171.42 01/19/2017   MPG 249 11/09/2016   Lab Results  Component Value Date   PROLACTIN 24.5 (H) 09/24/2016   PROLACTIN 3.4 (L) 07/07/2016   Lab Results  Component Value Date   CHOL 122 01/19/2017   TRIG 154 (H) 01/19/2017   HDL 36 (L) 01/19/2017   CHOLHDL 3.4 01/19/2017   VLDL 31 01/19/2017   LDLCALC 55 01/19/2017   LDLCALC 59 12/28/2016    Physical Findings: AIMS: Facial and Oral Movements Muscles of Facial Expression: None, normal Lips and Perioral Area: None, normal Jaw: None, normal Tongue: None, normal,Extremity Movements Upper (arms, wrists, hands, fingers): None, normal Lower (legs, knees, ankles, toes): None, normal, Trunk Movements Neck, shoulders, hips: None, normal, Overall Severity Severity of abnormal movements (highest score from questions above): None, normal Incapacitation due to abnormal movements: None, normal Patient's awareness of abnormal movements (rate only patient's report): No Awareness, Dental Status Current problems with teeth and/or dentures?: No Does patient usually wear dentures?: No  CIWA:    COWS:  COWS Total Score: 1  Musculoskeletal: Strength & Muscle Tone: decreased Gait & Station: normal Patient leans: [name]  Psychiatric Specialty Exam: Physical Exam  Nursing note and vitals reviewed. Constitutional: He appears well-developed and well-nourished.  HENT:  Head: Normocephalic and atraumatic.  Eyes:  Pupils are equal, round, and reactive to light. Conjunctivae are normal.  Neck: Normal range of motion.  Cardiovascular: Regular rhythm and normal heart sounds.   Respiratory: Effort normal and breath sounds normal. No respiratory distress.  GI: Soft.  Musculoskeletal: Normal range of motion.  Neurological: He is alert.  Skin: Skin is warm and dry.  Psychiatric: His affect is blunt. His speech is delayed. He is slowed and withdrawn. Thought content is paranoid. Cognition and memory are impaired. He expresses impulsivity. He expresses suicidal ideation. He expresses no homicidal ideation.    Review of Systems  Constitutional: Negative.   HENT: Negative.   Eyes: Negative.   Respiratory: Negative.   Cardiovascular: Negative.   Gastrointestinal: Negative.   Musculoskeletal: Negative.   Skin: Negative.   Neurological: Negative.   Psychiatric/Behavioral: Positive for depression, hallucinations, memory loss and suicidal ideas. Negative for substance abuse. The patient is nervous/anxious and has insomnia.     Blood pressure 138/78, pulse 92, temperature 98.5 F (36.9 C), resp. rate 18, height 6' (1.829 m), weight 78.5 kg (173 lb), SpO2 100 %.Body mass index is 23.46 kg/m.  General Appearance: Disheveled  Eye Contact:  Fair  Speech:  Slow  Volume:  Decreased  Mood:  Depressed and Dysphoric  Affect:  Constricted and Depressed  Thought Process:  Disorganized  Orientation:  Full (Time, Place, and Person)  Thought Content:  Illogical, Hallucinations: Auditory and Paranoid Ideation  Suicidal Thoughts:  Yes.  without intent/plan  Homicidal Thoughts:  No  Memory:  Immediate;   Good Recent;   Fair Remote;   Fair  Judgement:  Impaired  Insight:  Shallow  Psychomotor Activity:  Decreased  Concentration:  Concentration: Poor  Recall:  Poor  Fund of Knowledge:  Poor  Language:  Fair  Akathisia:  No  Handed:  Right  AIMS (if indicated):     Assets:  Desire for Improvement  ADL's:   Impaired  Cognition:  Impaired,  Mild  Sleep:  Number of Hours: 7     Treatment Plan Summary: Daily contact with patient to assess and evaluate symptoms and progress in treatment, Medication management and Plan As far as his mental health issues the patient as noted previously often seems to pull it together a little bit in the [facility] only to decompensate immediately with discharge which is making it difficult to know how we are really going to aim  for a goal of getting him out of the [facility]. Nevertheless we will continue the clozapine and I have recommended to him that we restart ECT as he did seem to get some benefit from that becoming more social and active and his mood less depressed last time that we had him on our service. He is agreeable to that. I have spoken with the ECT service and we will put him on the schedule to start again on Friday. As far as his diabetes in the past we have had a lot of trouble getting his sugars under decent control but this time it looks like he is actually running some low blood sugars in the morning. Part of this is probably because he is not eating well. Nevertheless I will cut down his evening dose to 35 units at bedtime and decrease the meal dose to 3 units with each meal while we continue 4 times a day checks. Patient has been encouraged to get up out of bed and interact with other people. We will continue to monitor and work with him daily. Unclear what the ultimate disposition will be.  Mordecai Rasmussen, MD 01/20/2017, 8:05 PM

## 2017-01-20 NOTE — Progress Notes (Signed)
Recreation Therapy Notes  At approximately 11:20 am, LRT attempted assessment. Patient refused.  Jacquelynn Cree, LRT/CTRS 01/20/2017 11:44 AM

## 2017-01-20 NOTE — Progress Notes (Signed)
Patient ID: [facility], male   DOB: [birth year], 41 y.o.   MRN: [id number]  Spoke with diabetes nurse coordinator this morning who suggested decrease of mealtime Novolog from 8 to 5 units. Orders signed.

## 2017-01-21 ENCOUNTER — Other Ambulatory Visit: Payer: Self-pay | Admitting: Psychiatry

## 2017-01-21 LAB — GLUCOSE, CAPILLARY
GLUCOSE-CAPILLARY: 164 mg/dL — AB (ref 65–99)
Glucose-Capillary: 38 mg/dL — CL (ref 65–99)
Glucose-Capillary: 117 mg/dL — ABNORMAL HIGH (ref 65–99)
Glucose-Capillary: 146 mg/dL — ABNORMAL HIGH (ref 65–99)
Glucose-Capillary: 318 mg/dL — ABNORMAL HIGH (ref 65–99)

## 2017-01-21 NOTE — Progress Notes (Signed)
[facility] MD Progress Note  01/21/2017 9:47 PM Tony Cunningham  MRN:  [id number] Subjective:  41 year old man with a history of schizoaffective disorder who came back into the hospital only shortly after his most recent discharge this time reporting hallucinations and suicidal ideation. On evaluation today patient is in bed staying withdrawn and hardly ever attending groups. He tells me his mood is very sad and feels frightened. He says he's having auditory hallucinations and active wishes that he were dead. He is not acting on any of these and is not aggressive. Patient has been compliant with medication. He admits to me that he really would rather stay in the hospital forever and does not really ever want to be placed in a group home or any other kind of even semi-independent living situation.  Follow-up for Thursday the 30th. Patient seen chart reviewed. This 41 year old man with chronic severe mental health problems and failure to transition to outpatient care is once again complaining of hallucinations. He is withdrawn most of the time. Minimal interaction with other people. Denies active suicidal thoughts. Hardly ever gets around other people though. Tolerating medicines well. His blood sugars are reasonably stable although he once again had an over 300 reading this evening.  Principal Problem: Schizoaffective disorder, depressive type (HCC) Diagnosis:   Patient Active Problem List   Diagnosis Date Noted  . 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 Schizoaffective disorder, depressive type (HCC) [F25.1] 09/23/2016  . Polysubstance abuse [F19.10] 07/17/2016  . Tardive dyskinesia [G24.01] 07/16/2016  . Tobacco use disorder [F17.200] 07/07/2016  . Dyslipidemia [E78.5] 07/07/2016  . Asthma [J45.909] 07/07/2016  . HTN (hypertension) [I10] 07/06/2016  . Diabetes (HCC) [E11.9] 12/25/2010   Total Time spent with patient: 30 minutes  Past Psychiatric History: Patient has a long-standing  history of mental illness but over the last year or so seems to of decompensated more than usual and has had one hospitalization after another. He did show some response to ECT and antipsychotic medication but seems to decompensate immediately whenever he is discharged.  Past Medical History:  Past Medical History:  Diagnosis Date  . Anxiety   . Asthma   . Diabetes mellitus   . High blood pressure   . Sinus complaint    History reviewed. No pertinent surgical history. Family History: History reviewed. No pertinent family history. Family Psychiatric  History: Unknown Social History:  History  Alcohol Use No     History  Drug Use No    Social History   Social History  . Marital status: Single    Spouse name: [name]  . Number of children: [name]  . Years of education: [name]   Social History Main Topics  . Smoking status: Current Every Day Smoker    Packs/day: 0.50    Types: Cigarettes  . Smokeless tobacco: Never Used  . Alcohol use No  . Drug use: No  . Sexual activity: Not Currently   Other Topics Concern  . None   Social History Narrative  . None   Additional Social History:                         Sleep: Fair  Appetite:  Poor  Current Medications: Current Facility-Administered Medications  Medication Dose Route Frequency Provider Last Rate Last Dose  . acetaminophen (TYLENOL) tablet 650 mg  650 mg Oral Q6H PRN [name], [name], MD      . alum & mag hydroxide-simeth (MAALOX/MYLANTA) 200-200-20 MG/5ML suspension 30  mL  30 mL Oral Q4H PRN [name], [name], MD      . citalopram (CELEXA) tablet 20 mg  20 mg Oral Daily [name], [name], MD   20 mg at 01/21/17 0800  . cloZAPine (CLOZARIL) tablet 150 mg  150 mg Oral QHS [name], [name], MD   150 mg at 01/20/17 2131  . insulin aspart (novoLOG) injection 0-5 Units  0-5 Units Subcutaneous QHS [name], [name], MD      . insulin aspart (novoLOG) injection 0-9 Units  0-9 Units Subcutaneous TID WC [name], [name], MD   1  Units at 01/21/17 1629  . insulin aspart (novoLOG) injection 3 Units  3 Units Subcutaneous TID WC [name], [name], MD   3 Units at 01/21/17 1629  . insulin glargine (LANTUS) injection 35 Units  35 Units Subcutaneous QHS [name], [name], MD   35 Units at 01/20/17 2125  . lisinopril (PRINIVIL,ZESTRIL) tablet 20 mg  20 mg Oral Daily [name], [name], MD   20 mg at 01/21/17 0800  . magnesium hydroxide (MILK OF MAGNESIA) suspension 30 mL  30 mL Oral Daily PRN [name], [name], MD      . metFORMIN (GLUCOPHAGE) tablet 1,000 mg  1,000 mg Oral BID WC [name], [name], MD   1,000 mg at 01/21/17 1628  . montelukast (SINGULAIR) tablet 10 mg  10 mg Oral QHS [name], [name], MD   10 mg at 01/20/17 2115  . nicotine (NICODERM CQ - dosed in mg/24 hours) patch 21 mg  21 mg Transdermal Daily [name], [name], MD      . OLANZapine (ZYPREXA) tablet 10 mg  10 mg Oral QHS [name], [name], MD   10 mg at 01/20/17 2115  . pantoprazole (PROTONIX) EC tablet 40 mg  40 mg Oral Daily [name], [name], MD   40 mg at 01/21/17 0800  . propranolol (INDERAL) tablet 20 mg  20 mg Oral BID [name], [name], MD   20 mg at 01/21/17 1628  . temazepam (RESTORIL) capsule 7.5 mg  7.5 mg Oral QHS [name], [name], MD   7.5 mg at 01/20/17 2115    Lab Results:  Results for orders placed or performed during the hospital encounter of 01/19/17 (from the past 48 hour(s))  Glucose, capillary     Status: Abnormal   Collection Time: 01/19/17 11:08 PM  Result Value Ref Range   Glucose-Capillary 172 (H) 65 - 99 mg/dL   Comment 1 Notify RN    Comment 2 Document in Chart   Glucose, capillary     Status: Abnormal   Collection Time: 01/20/17  7:06 AM  Result Value Ref Range   Glucose-Capillary 123 (H) 65 - 99 mg/dL   Comment 1 Notify RN   Glucose, capillary     Status: Abnormal   Collection Time: 01/20/17 11:37 AM  Result Value Ref Range   Glucose-Capillary 38 (LL) 65 - 99 mg/dL    Comment: QUESTIONABLE RESULTS - CHARGE CREDITED REPEATED TO  VERIFY    Comment 1 Repeat Test   Glucose, capillary     Status: Abnormal   Collection Time: 01/20/17 11:41 AM  Result Value Ref Range   Glucose-Capillary 54 (L) 65 - 99 mg/dL  Glucose, capillary     Status: None   Collection Time: 01/20/17 12:05 PM  Result Value Ref Range   Glucose-Capillary 76 65 - 99 mg/dL  Glucose, capillary     Status: Abnormal   Collection Time: 01/20/17  12:34 PM  Result Value Ref Range   Glucose-Capillary 111 (H) 65 - 99 mg/dL  Glucose, capillary     Status: Abnormal   Collection Time: 01/20/17  4:24 PM  Result Value Ref Range   Glucose-Capillary 108 (H) 65 - 99 mg/dL  Glucose, capillary     Status: Abnormal   Collection Time: 01/20/17  8:19 PM  Result Value Ref Range   Glucose-Capillary 147 (H) 65 - 99 mg/dL  Glucose, capillary     Status: Abnormal   Collection Time: 01/21/17  7:10 AM  Result Value Ref Range   Glucose-Capillary 164 (H) 65 - 99 mg/dL   Comment 1 Notify RN   Glucose, capillary     Status: Abnormal   Collection Time: 01/21/17 11:32 AM  Result Value Ref Range   Glucose-Capillary 117 (H) 65 - 99 mg/dL  Glucose, capillary     Status: Abnormal   Collection Time: 01/21/17  4:27 PM  Result Value Ref Range   Glucose-Capillary 146 (H) 65 - 99 mg/dL  Glucose, capillary     Status: Abnormal   Collection Time: 01/21/17  8:22 PM  Result Value Ref Range   Glucose-Capillary 318 (H) 65 - 99 mg/dL   Comment 1 Notify RN     Blood Alcohol level:  Lab Results  Component Value Date   ETH <5 01/16/2017   ETH <5 12/21/2016    Metabolic Disorder Labs: Lab Results  Component Value Date   HGBA1C 7.6 (H) 01/19/2017   MPG 171.42 01/19/2017   MPG 249 11/09/2016   Lab Results  Component Value Date   PROLACTIN 24.5 (H) 09/24/2016   PROLACTIN 3.4 (L) 07/07/2016   Lab Results  Component Value Date   CHOL 122 01/19/2017   TRIG 154 (H) 01/19/2017   HDL 36 (L) 01/19/2017   CHOLHDL 3.4 01/19/2017   VLDL 31 01/19/2017   LDLCALC 55 01/19/2017    LDLCALC 59 12/28/2016    Physical Findings: AIMS: Facial and Oral Movements Muscles of Facial Expression: None, normal Lips and Perioral Area: None, normal Jaw: None, normal Tongue: None, normal,Extremity Movements Upper (arms, wrists, hands, fingers): None, normal Lower (legs, knees, ankles, toes): None, normal, Trunk Movements Neck, shoulders, hips: None, normal, Overall Severity Severity of abnormal movements (highest score from questions above): None, normal Incapacitation due to abnormal movements: None, normal Patient's awareness of abnormal movements (rate only patient's report): No Awareness, Dental Status Current problems with teeth and/or dentures?: No Does patient usually wear dentures?: No  CIWA:    COWS:  COWS Total Score: 1  Musculoskeletal: Strength & Muscle Tone: decreased Gait & Station: normal Patient leans: [name]  Psychiatric Specialty Exam: Physical Exam  Nursing note and vitals reviewed. Constitutional: He appears well-developed and well-nourished.  HENT:  Head: Normocephalic and atraumatic.  Eyes: Pupils are equal, round, and reactive to light. Conjunctivae are normal.  Neck: Normal range of motion.  Cardiovascular: Regular rhythm and normal heart sounds.   Respiratory: Effort normal and breath sounds normal. No respiratory distress.  GI: Soft.  Musculoskeletal: Normal range of motion.  Neurological: He is alert.  Skin: Skin is warm and dry.  Psychiatric: His affect is blunt. His speech is delayed. He is slowed and withdrawn. Thought content is paranoid. Cognition and memory are impaired. He expresses impulsivity. He expresses suicidal ideation. He expresses no homicidal ideation.    Review of Systems  Constitutional: Negative.   HENT: Negative.   Eyes: Negative.   Respiratory: Negative.   Cardiovascular: Negative.  Gastrointestinal: Negative.   Musculoskeletal: Negative.   Skin: Negative.   Neurological: Negative.   Psychiatric/Behavioral:  Positive for depression, hallucinations, memory loss and suicidal ideas. Negative for substance abuse. The patient is nervous/anxious and has insomnia.     Blood pressure 128/76, pulse 86, temperature 98.1 F (36.7 C), resp. rate 18, height 6' (1.829 m), weight 173 lb (78.5 kg), SpO2 100 %.Body mass index is 23.46 kg/m.  General Appearance: Disheveled  Eye Contact:  Fair  Speech:  Slow  Volume:  Decreased  Mood:  Depressed and Dysphoric  Affect:  Constricted and Depressed  Thought Process:  Disorganized  Orientation:  Full (Time, Place, and Person)  Thought Content:  Illogical, Hallucinations: Auditory and Paranoid Ideation  Suicidal Thoughts:  Yes.  without intent/plan  Homicidal Thoughts:  No  Memory:  Immediate;   Good Recent;   Fair Remote;   Fair  Judgement:  Impaired  Insight:  Shallow  Psychomotor Activity:  Decreased  Concentration:  Concentration: Poor  Recall:  Poor  Fund of Knowledge:  Poor  Language:  Fair  Akathisia:  No  Handed:  Right  AIMS (if indicated):     Assets:  Desire for Improvement  ADL's:  Impaired  Cognition:  Impaired,  Mild  Sleep:  Number of Hours: 5     Treatment Plan Summary: Daily contact with patient to assess and evaluate symptoms and progress in treatment, Medication management and Plan The plan at this point is for [place] to add ECT to his current treatment regimen. ECT will be initiated tomorrow and he is on the schedule. Patient is agreeable to the plan. Continue current medicine for now although we may want to titrate and reassess after checking levels of his medicine. His blood sugars are delicate. I would rather have them a little up then too far down so we will be careful about not adjusting to quickly.  Mordecai Rasmussen, MD 01/21/2017, 9:47 PM

## 2017-01-21 NOTE — [facility] Group Notes (Signed)
[facility] LCSW Group Therapy Note  Date/Time: 01/21/17, 1300  Type of Therapy/Topic:  Group Therapy:  Balance in Life  Participation Level:  Did not attend  Description of Group:    This group will address the concept of balance and how it feels and looks when one is unbalanced. Patients will be encouraged to process areas in their lives that are out of balance, and identify reasons for remaining unbalanced. Facilitators will guide patients utilizing problem- solving interventions to address and correct the stressor making their life unbalanced. Understanding and applying boundaries will be explored and addressed for obtaining  and maintaining a balanced life. Patients will be encouraged to explore ways to assertively make their unbalanced needs known to significant others in their lives, using other group members and facilitator for support and feedback.  Therapeutic Goals: 1. Patient will identify two or more emotions or situations they have that consume much of in their lives. 2. Patient will identify signs/triggers that life has become out of balance:  3. Patient will identify two ways to set boundaries in order to achieve balance in their lives:  4. Patient will demonstrate ability to communicate their needs through discussion and/or role plays  Summary of Patient Progress:          Therapeutic Modalities:   Cognitive Behavioral Therapy Solution-Focused Therapy Assertiveness Training  Daleen Squibb, LCSW

## 2017-01-21 NOTE — Plan of Care (Signed)
Problem: Education: Goal: Ability to make informed decisions regarding treatment will improve Outcome: Not Progressing Patient does not display the cognitive ability to make informed decisions regarding care.  Problem: Health Behavior/Discharge Planning: Goal: Identification of resources available to assist in meeting health care needs will improve Outcome: Not Progressing Patient can not identify the resources  To assist in meeting his health care needs.  Problem: Medication: Goal: Compliance with prescribed medication regimen will improve Outcome: Progressing Patient is compliant with his prescribed medication regimen.  Problem: Activity: Goal: Interest or engagement in leisure activities will improve Outcome: Not Progressing Patient does not show any interest nor engage in leisure activities.  Problem: Coping: Goal: Ability to verbalize feelings will improve Outcome: Progressing Patient shows the ability to verbalize feelings to staff

## 2017-01-21 NOTE — Progress Notes (Signed)
Recreation Therapy Notes  At approximately 10:25 am, LRT attempted assessment. Patient refused. Due to patient refusing assessment three consecutive times, LRT will not continue to attempt assessment.  Jacquelynn Cree, LRT/CTRS 01/21/2017 1:47 PM

## 2017-01-21 NOTE — Progress Notes (Signed)
Recreation Therapy Notes  Date: 08.30.18 Time: 9:30 am Location: Craft Room  Group Topic: Leisure Education  Goal Area(s) Addresses:  Patient will identify things they are grateful for. Patient will identify how being grateful can influence decision making.  Behavioral Response: Did not attend  Intervention: Grateful Wheel  Activity: Patients were given an I Am Grateful For worksheet and were instructed to write things they are grateful for under each category.   Education: LRT educated patients on leisure.   Education Outcome: Patient did not attend group.  Clinical Observations/Feedback: Patient did not attend group.   Jacquelynn Cree, LRT/CTRS 01/21/2017 10:11 AM

## 2017-01-21 NOTE — Progress Notes (Signed)
Pt continues to be anxious and depressed. Pt says depression is a 7/10. Pt did verbalize contract to safety. Alert and oriented x 4. Pt is passive SI, denies HI or A/V hallucinations. Pt is med compliant, no adverse affects noted. Denies pain or discomfort. Pt did attend group and interacted with staff and peers appropriately. Pt seems to be interacting with peers more. Pt did go to snack in dayroom. 15 min safety checks continues.

## 2017-01-21 NOTE — Plan of Care (Signed)
Problem: Coping: Goal: Ability to verbalize frustrations and anger appropriately will improve Outcome: Progressing Pt will communicate his feelings of anger and frustrations this shift.

## 2017-01-21 NOTE — Progress Notes (Signed)
Patient is alert and oriented to person and place. Skin is warm, dry and intact. No limitations to all four extremities noted. Patient denies SI but states, "I can still hear the voices a little, they are not telling me to hurt myself though." Patient was observed ambulating in hall during the shift with a steady gait. Attends meals with frequent encouragement from staff, minimal peer interaction noted. Observed resting in be with eyes closed this morning. Blood sugar and blood pressure monitored and medicated per protocol. Milieu remains therapeutic. Patient will be monitored and physician notified of any acute changes.

## 2017-01-22 ENCOUNTER — Encounter: Payer: Self-pay | Admitting: Anesthesiology

## 2017-01-22 ENCOUNTER — Inpatient Hospital Stay: Payer: Medicare Other | Admitting: Anesthesiology

## 2017-01-22 LAB — CBC WITH DIFFERENTIAL/PLATELET
BASOS ABS: 0.1 10*3/uL (ref 0–0.1)
Basophils Relative: 1 %
EOS ABS: 0.3 10*3/uL (ref 0–0.7)
EOS PCT: 4 %
HCT: 41.8 % (ref 40.0–52.0)
Hemoglobin: 14.7 g/dL (ref 13.0–18.0)
Lymphocytes Relative: 44 %
Lymphs Abs: 2.8 10*3/uL (ref 1.0–3.6)
MCH: 31.2 pg (ref 26.0–34.0)
MCHC: 35.2 g/dL (ref 32.0–36.0)
MCV: 88.7 fL (ref 80.0–100.0)
MONO ABS: 0.4 10*3/uL (ref 0.2–1.0)
Monocytes Relative: 7 %
Neutro Abs: 2.9 10*3/uL (ref 1.4–6.5)
Neutrophils Relative %: 44 %
PLATELETS: 177 10*3/uL (ref 150–440)
RBC: 4.71 MIL/uL (ref 4.40–5.90)
RDW: 13.1 % (ref 11.5–14.5)
WBC: 6.5 10*3/uL (ref 3.8–10.6)

## 2017-01-22 LAB — GLUCOSE, CAPILLARY
GLUCOSE-CAPILLARY: 272 mg/dL — AB (ref 65–99)
GLUCOSE-CAPILLARY: 145 mg/dL — AB (ref 65–99)
Glucose-Capillary: 204 mg/dL — ABNORMAL HIGH (ref 65–99)
Glucose-Capillary: 129 mg/dL — ABNORMAL HIGH (ref 65–99)

## 2017-01-22 MED ORDER — MIDAZOLAM HCL 2 MG/2ML IJ SOLN: 4.0000 mg | Freq: Once | INTRAMUSCULAR | Status: DC

## 2017-01-22 MED ORDER — METHOHEXITAL SODIUM 100 MG/10ML IV SOSY
PREFILLED_SYRINGE | INTRAVENOUS | Status: DC | PRN
  Administered 2017-01-22: 80 mg via INTRAVENOUS

## 2017-01-22 MED ORDER — MIDAZOLAM HCL 2 MG/2ML IJ SOLN
4.0000 mg | Freq: Once | INTRAMUSCULAR | Status: AC
  Administered 2017-01-22: 4 mg via INTRAVENOUS

## 2017-01-22 MED ORDER — SUCCINYLCHOLINE CHLORIDE 200 MG/10ML IV SOSY
PREFILLED_SYRINGE | INTRAVENOUS | Status: DC | PRN
  Administered 2017-01-22: 80 mg via INTRAVENOUS

## 2017-01-22 MED ORDER — SODIUM CHLORIDE 0.9 % IV SOLN
500.0000 mL | Freq: Once | INTRAVENOUS | Status: AC
  Administered 2017-01-22: 10:00:00 via INTRAVENOUS

## 2017-01-22 MED ORDER — SUCCINYLCHOLINE CHLORIDE 20 MG/ML IJ SOLN
INTRAMUSCULAR | Status: AC
Start: 2017-01-22 — End: ?
  Filled 2017-01-22: qty 1

## 2017-01-22 MED ORDER — MIDAZOLAM HCL 2 MG/2ML IJ SOLN
INTRAMUSCULAR | Status: AC
  Filled 2017-01-22: qty 4

## 2017-01-22 MED ORDER — HALOPERIDOL LACTATE 5 MG/ML IJ SOLN
5.0000 mg | Freq: Once | INTRAMUSCULAR | Status: AC
  Administered 2017-01-22: 5 mg via INTRAVENOUS
  Filled 2017-01-22: qty 1

## 2017-01-22 MED ORDER — HALOPERIDOL LACTATE 5 MG/ML IJ SOLN
5.0000 mg | Freq: Once | INTRAMUSCULAR | Status: DC
  Filled 2017-01-22: qty 1

## 2017-01-22 MED ORDER — SODIUM CHLORIDE 0.9 % IV SOLN: 500.0000 mL | Freq: Once | INTRAVENOUS | Status: DC

## 2017-01-22 NOTE — Progress Notes (Signed)
Inpatient Diabetes Program Recommendations  AACE/ADA: New Consensus Statement on Inpatient Glycemic Control (2015)  Target Ranges:  Prepandial:   less than 140 mg/dL      Peak postprandial:   less than 180 mg/dL (1-2 hours)      Critically ill patients:  140 - 180 mg/dL   Lab Results  Component Value Date   GLUCAP 272 (H) 01/22/2017   HGBA1C 7.6 (H) 01/19/2017   Results for NOE, GOYER (MRN [id number]) as of 01/22/2017 11:18  Ref. Range 01/21/2017 07:10 01/21/2017 11:32 01/21/2017 16:27 01/21/2017 20:22 01/22/2017 06:14  Glucose-Capillary Latest Ref Range: 65 - 99 mg/dL 132 (H) 440 (H) 102 (H) 318 (H) 272 (H)    Outpatient Diabetes medications: Lantus 55 units QHS Novolog 10 units TID with meals Metformin 1000 mg BID  Current Insulin Orders: Lantus 35 units QHS Novolog 3units TID meal coverage Metformin 1000 mg BID                                       Novolog sensitive tid with meals and HS  Patient missed doses of Novolog this morning because of ECT., please give Novolog correction and mealtime Novolog at lunch when he returns.    CBG appears to be high last evening because of his intake? % eaten states "240" .  No medication change recommendations.   Susette Racer, RN, BA, MHA, CDE Diabetes Coordinator Inpatient Diabetes Program  [phone] (Team Pager) [phone] [facility] Office) 01/22/2017 11:27 AM

## 2017-01-22 NOTE — Progress Notes (Signed)
Recreation Therapy Notes  Date: 08.31.18 Time: 9:30 am Location: Craft Room  Group Topic: Coping Skills  Goal Area(s) Addresses:  Patient will verbalize benefit of using art as a coping skill. Patient will verbalize one emotion experienced in group.  Behavioral Response: Did not attend  Intervention: Coloring  Activity: Patients were given coloring sheets to color and were instructed to think about the emotions they were feeling as well as what their minds were focused on.  Education: LRT educated patients on healthy coping skills.   Education Outcome: Patient did not attend group.  Clinical Observations/Feedback: Patient did not attend group.  Marielys Trinidad M, LRT/CTRS 01/22/2017 10:19 AM 

## 2017-01-22 NOTE — Procedures (Signed)
ECT SERVICES Physician's Interval Evaluation & Treatment Note  Patient Identification: Tony Cunningham MRN:  [id number] Date of Evaluation:  01/22/2017 TX #: [id number]  MADRS: 24  MMSE: 27  P.E. Findings:  No change from physical exam. Heart and lungs normal vitals unremarkable  Psychiatric Interval Note:  Severely depressed with suicidal thoughts and hallucinations  Subjective:  Patient is a 41 y.o. male seen for evaluation for Electroconvulsive Therapy. Depressed and very negative  Treatment Summary:   []   Right Unilateral             [x]  Bilateral   % Energy : 1.0 ms 60%   Impedance: 1790 ohms  Seizure Energy Index: 90,521 V squared  Postictal Suppression Index: 62%  Seizure Concordance Index: 78%  Medications  Pre Shock: Brevital 80 mg succinylcholine 100 mg  Post Shock: Versed 4 mg Haldol 5 mg  Seizure Duration: 45 seconds by EMG 65 seconds by EEG   Comments: Follow-up for Wednesday   Lungs:  [x]   Clear to auscultation               []  Other:   Heart:    [x]   Regular rhythm             []  irregular rhythm    [x]   Previous H&P reviewed, patient examined and there are NO CHANGES                 []   Previous H&P reviewed, patient examined and there are changes noted.   , MD 8/31/201810:58 AM

## 2017-01-22 NOTE — Progress Notes (Signed)
Tony Cunningham had ECT today and tolerated procedure well. He is up on the unit at times but keeps to himself. He states he just wants to get out of here. He is med compliant. Will continue to monitor.

## 2017-01-22 NOTE — Anesthesia Postprocedure Evaluation (Signed)
Anesthesia Post Note  Patient: Arlon Bleier  Procedure(s) Performed: * No procedures listed *  Patient location during evaluation: PACU Anesthesia Type: General Level of consciousness: awake and alert Pain management: pain level controlled Vital Signs Assessment: post-procedure vital signs reviewed and stable Respiratory status: spontaneous breathing, nonlabored ventilation, respiratory function stable and patient connected to nasal cannula oxygen Cardiovascular status: blood pressure returned to baseline and stable Postop Assessment: no signs of nausea or vomiting Anesthetic complications: no     Last Vitals:  Vitals:   01/22/17 1143 01/22/17 1145  BP:    Pulse: 87 81  Resp: 13 (!) 28  Temp: 36.4 C   SpO2: 96% 98%    Last Pain:  Vitals:   01/22/17 1114  TempSrc:   PainSc: 0-No pain                 Cleda Mccreedy Piscitello

## 2017-01-22 NOTE — [facility] Group Notes (Signed)
[facility] Group Notes:  (Nursing/MHT/Case Management/Adjunct)  Date:  01/22/2017  Time:  10:54 PM  Type of Therapy:  Evening Wrap-up Group  Participation Level:  Active  Participation Quality:  Appropriate and Attentive  Affect:  Appropriate  Cognitive:  Alert and Appropriate  Insight:  Appropriate, Good and Improving  Engagement in Group:  Developing/Improving and Engaged  Modes of Intervention:  Discussion  Summary of Progress/Problems:  Tony Cunningham 01/22/2017, 10:54 PM

## 2017-01-22 NOTE — Tx Team (Signed)
Interdisciplinary Treatment and Diagnostic Plan Update  01/22/2017 Time of Session: 5361 Tony Cunningham MRN: [id number]  Principal Diagnosis: Schizoaffective disorder, depressive type ([place])  Secondary Diagnoses: Principal Problem:   Schizoaffective disorder, depressive type ([place]) Active Problems:   Diabetes ([place])   HTN (hypertension)   Tobacco use disorder   Dyslipidemia   Tardive dyskinesia   Current Medications:  Current Facility-Administered Medications  Medication Dose Route Frequency Provider Last Rate Last Dose  . acetaminophen (TYLENOL) tablet 650 mg  650 mg Oral Q6H PRN [name], Madie [place], MD   650 mg at 01/22/17 1247  . alum & mag hydroxide-simeth (MAALOX/MYLANTA) 200-200-20 MG/5ML suspension 30 mL  30 mL Oral Q4H PRN [name], [name], MD      . citalopram (CELEXA) tablet 20 mg  20 mg Oral Daily [name], [name], MD   20 mg at 01/22/17 1235  . cloZAPine (CLOZARIL) tablet 150 mg  150 mg Oral QHS [name], [name], MD   150 mg at 01/21/17 2230  . insulin aspart (novoLOG) injection 0-5 Units  0-5 Units Subcutaneous QHS [name], Madie Reno, MD   4 Units at 01/21/17 2229  . insulin aspart (novoLOG) injection 0-9 Units  0-9 Units Subcutaneous TID WC [name], Madie Reno, MD   1 Units at 01/22/17 1238  . insulin aspart (novoLOG) injection 3 Units  3 Units Subcutaneous TID WC [name], Madie Reno, MD   3 Units at 01/22/17 1241  . insulin glargine (LANTUS) injection 35 Units  35 Units Subcutaneous QHS [name], Madie Reno, MD   35 Units at 01/21/17 2229  . lisinopril (PRINIVIL,ZESTRIL) tablet 20 mg  20 mg Oral Daily [name], [name], MD   20 mg at 01/22/17 1235  . magnesium hydroxide (MILK OF MAGNESIA) suspension 30 mL  30 mL Oral Daily PRN [name], [name], MD      . metFORMIN (GLUCOPHAGE) tablet 1,000 mg  1,000 mg Oral BID WC [name], [name], MD   1,000 mg at 01/22/17 1235  . montelukast (SINGULAIR) tablet 10 mg  10 mg Oral QHS [name], Madie Reno, MD   10 mg at 01/21/17 2231  . nicotine (NICODERM CQ - dosed in mg/24  hours) patch 21 mg  21 mg Transdermal Daily [name], [name], MD      . OLANZapine (ZYPREXA) tablet 10 mg  10 mg Oral QHS [name], Madie Reno, MD   10 mg at 01/21/17 2232  . pantoprazole (PROTONIX) EC tablet 40 mg  40 mg Oral Daily [name], Madie Reno, MD   40 mg at 01/22/17 1235  . propranolol (INDERAL) tablet 20 mg  20 mg Oral BID [name], Madie Reno, MD   20 mg at 01/22/17 0602  . temazepam (RESTORIL) capsule 7.5 mg  7.5 mg Oral QHS [name], [name], MD   7.5 mg at 01/21/17 2232   PTA Medications: Prescriptions Prior to Admission  Medication Sig Dispense Refill Last Dose  . amantadine (SYMMETREL) 100 MG capsule Take 1 capsule (100 mg total) by mouth 2 (two) times daily. For prevention of drug induced tremors 60 capsule 0 Unknown at Unknown  . benztropine (COGENTIN) 1 MG tablet Take 1 tablet (1 mg total) by mouth 2 (two) times daily. For prevention of drug induced tremors 60 tablet 0 Unknown at Unknown  . cholecalciferol 400 units tablet Take 1 tablet (400 Units total) by mouth daily. For bone health 30 each 0 Unknown at Unknown  . citalopram (CELEXA) 20 MG tablet Take 1 tablet (20 mg total) by mouth  daily. For depression 30 tablet 0 Unknown at Unknown  . cloZAPine (CLOZARIL) 50 MG tablet Take 3 tablets (150 mg total) by mouth at bedtime. For mood control 90 tablet 0 Unknown at Unknown  . cyclobenzaprine (FLEXERIL) 5 MG tablet Take 1 tablet (5 mg total) by mouth 3 (three) times daily as needed for muscle spasms. 15 tablet 0 Unknown at Unknown  . diclofenac sodium (VOLTAREN) 1 % GEL Apply 2 g topically 4 (four) times daily. For arthritis pain   Unknown at Unknown  . insulin aspart (NOVOLOG) 100 UNIT/ML injection Inject 6 Units into the skin 3 (three) times daily with meals. 10 mL 11 Unknown at Unknown  . insulin glargine (LANTUS) 100 UNIT/ML injection Inject 0.45 mLs (45 Units total) into the skin at bedtime. For diabetic management 10 mL 0   . insulin glargine (LANTUS) 100 UNIT/ML injection Inject 0.4  mLs (40 Units total) into the skin at bedtime. 10 mL 11   . lisinopril (PRINIVIL,ZESTRIL) 20 MG tablet Take 1 tablet (20 mg total) by mouth daily. For high blood pressure 30 tablet 0 Unknown at Unknown  . metFORMIN (GLUCOPHAGE) 1000 MG tablet Take 1 tablet (1,000 mg total) by mouth 2 (two) times daily with a meal. For diabetes management 60 tablet 0 Unknown at Unknown  . montelukast (SINGULAIR) 10 MG tablet Take 1 tablet (10 mg total) by mouth at bedtime. For shortness of breath 30 tablet 0 Unknown at Unknown  . nicotine polacrilex (NICORETTE) 2 MG gum Take 1 each (2 mg total) by mouth as needed for smoking cessation. 100 tablet 0 Unknown at Unknown  . pantoprazole (PROTONIX) 40 MG tablet Take 1 tablet (40 mg total) by mouth daily. For acid reflux 30 tablet 0 Unknown at Unknown  . polyethylene glycol (MIRALAX / GLYCOLAX) packet Take 17 g by mouth daily. For constipation 14 each 0 Unknown at Unknown  . propranolol (INDERAL) 20 MG tablet Take 1 tablet (20 mg total) by mouth 2 (two) times daily. For high blood pressure 60 tablet 0 Unknown at Unknown  . simvastatin (ZOCOR) 20 MG tablet Take 1 tablet (20 mg total) by mouth daily at 6 PM. For high Cholesterol 30 tablet 0 Unknown at Unknown  . temazepam (RESTORIL) 7.5 MG capsule Take 1 capsule (7.5 mg total) by mouth at bedtime. For sleep 14 capsule 0 Unknown at Unknown    Patient Stressors: Health problems Medication change or noncompliance  Patient Strengths: Active sense of humor Motivation for treatment/growth  Treatment Modalities: Medication Management, Group therapy, Case management,  1 to 1 session with clinician, Psychoeducation, Recreational therapy.   Physician Treatment Plan for Primary Diagnosis: Schizoaffective disorder, depressive type ([place]) Long Term Goal(s): Improvement in symptoms so as ready for discharge NA   Short Term Goals: Ability to identify changes in lifestyle to reduce recurrence of condition will improve Ability to  verbalize feelings will improve Ability to disclose and discuss suicidal ideas Ability to demonstrate self-control will improve Ability to identify and develop effective coping behaviors will improve Ability to maintain clinical measurements within normal limits will improve Ability to identify triggers associated with substance abuse/mental health issues will improve NA  Medication Management: Evaluate patient's response, side effects, and tolerance of medication regimen.  Therapeutic Interventions: 1 to 1 sessions, Unit Group sessions and Medication administration.  Evaluation of Outcomes: Not Met  Physician Treatment Plan for Secondary Diagnosis: Principal Problem:   Schizoaffective disorder, depressive type ([place]) Active Problems:   Diabetes ([place])   HTN (hypertension)  Tobacco use disorder   Dyslipidemia   Tardive dyskinesia  Long Term Goal(s): Improvement in symptoms so as ready for discharge NA   Short Term Goals: Ability to identify changes in lifestyle to reduce recurrence of condition will improve Ability to verbalize feelings will improve Ability to disclose and discuss suicidal ideas Ability to demonstrate self-control will improve Ability to identify and develop effective coping behaviors will improve Ability to maintain clinical measurements within normal limits will improve Ability to identify triggers associated with substance abuse/mental health issues will improve NA     Medication Management: Evaluate patient's response, side effects, and tolerance of medication regimen.  Therapeutic Interventions: 1 to 1 sessions, Unit Group sessions and Medication administration.  Evaluation of Outcomes: Not Met   RN Treatment Plan for Primary Diagnosis: Schizoaffective disorder, depressive type ([place]) Long Term Goal(s): Knowledge of disease and therapeutic regimen to maintain health will improve  Short Term Goals: Ability to identify and develop effective coping  behaviors will improve and Compliance with prescribed medications will improve  Medication Management: RN will administer medications as ordered by provider, will assess and evaluate patient's response and provide education to patient for prescribed medication. RN will report any adverse and/or side effects to prescribing provider.  Therapeutic Interventions: 1 on 1 counseling sessions, Psychoeducation, Medication administration, Evaluate responses to treatment, Monitor vital signs and CBGs as ordered, Perform/monitor CIWA, COWS, AIMS and Fall Risk screenings as ordered, Perform wound care treatments as ordered.  Evaluation of Outcomes: Not Met   LCSW Treatment Plan for Primary Diagnosis: Schizoaffective disorder, depressive type ([place]) Long Term Goal(s): Safe transition to appropriate next level of care at discharge, Engage patient in therapeutic group addressing interpersonal concerns.  Short Term Goals: Engage patient in aftercare planning with referrals and resources and Increase skills for wellness and recovery  Therapeutic Interventions: Assess for all discharge needs, 1 to 1 time with Social worker, Explore available resources and support systems, Assess for adequacy in community support network, Educate family and significant other(s) on suicide prevention, Complete Psychosocial Assessment, Interpersonal group therapy.  Evaluation of Outcomes: Not Met   Progress in Treatment: Attending groups: No. Participating in groups: No. Taking medication as prescribed: Yes. Toleration medication: Yes. Family/Significant other contact made: No, will contact:  when given permission Patient understands diagnosis: Yes. Discussing patient identified problems/goals with staff: Yes. Medical problems stabilized or resolved: Yes. Denies suicidal/homicidal ideation: Yes. Issues/concerns per patient self-inventory: No. Other: none  New problem(s) identified: No, Describe:  none  New Short  Term/Long Term Goal(s): Pt unable to formulate a goal.  Discharge Plan or Barriers: CSW assessing for appropriate plan.  Reason for Continuation of Hospitalization: Other; describe ECT  Estimated Length of Stay: 7 days  Attendees: Patient:Tony Cunningham 01/22/2017   Physician: Dr Weber Cooks, MD 01/22/2017   Nursing: Cleda Mccreedy, RN 01/22/2017   RN Care Manager: 01/22/2017   Social Worker: Lurline Idol, LCSW 01/22/2017   Recreational Therapist: Everitt Amber, LRT/CTRS  01/22/2017   Other:  01/22/2017   Other:  01/22/2017   Other: 01/22/2017        Scribe for Treatment Team: Joanne Chars, LCSW 01/22/2017 2:58 PM

## 2017-01-22 NOTE — [facility] Group Notes (Signed)
[facility] Group Notes:  (Nursing/MHT/Case Management/Adjunct)  Date:  01/21/2017  Time:  9:27 PM  Type of Therapy:  Psychoeducational Skills  Participation Level:  Active  Participation Quality:  Appropriate, Attentive and Sharing  Affect:  Appropriate  Cognitive:  Appropriate  Insight:  Appropriate  Engagement in Group:  Engaged  Modes of Intervention:  Discussion, Socialization and Support  Summary of Progress/Problems:  Tony Cunningham 01/22/2017, 9:27 PM

## 2017-01-22 NOTE — Anesthesia Post-op Follow-up Note (Signed)
Anesthesia QCDR form completed.        

## 2017-01-22 NOTE — [facility] Counselor (Signed)
Adult Comprehensive Assessment  Patient ID: Tony Cunningham, male   DOB: December 30, 1975, 41 y.o.   MRN: [id number]  Information Source: Information source: Patient  Current Stressors:  Employment / Job issues: On disability Physical health (include injuries & life threatening diseases): poor complance  Living/Environment/Situation:  Living Arrangements: Group Home Living conditions (as described by patient or guardian): Currently this is the most approrpiate place for patient at discharge, recently placed in last [facility] admission How long has patient lived in current situation?: 24 hours What is atmosphere in current home: Supportive  Family History:  Marital status: Single Are you sexually active?: Yes What is your sexual orientation?: heterosexual Has your sexual activity been affected by drugs, alcohol, medication, or emotional stress?: none Does patient have children?: No  Childhood History:  By whom was/is the patient raised?: Both parents Additional childhood history information: Pt reports having a good childhood.  No issues reported.  Description of patient's relationship with caregiver when they were a child: Pt reports being closer to his father.  How were you disciplined when you got in trouble as a child/adolescent?: appropriate discipline Does patient have siblings?: No Did patient suffer any verbal/emotional/physical/sexual abuse as a child?: No Has patient ever been sexually abused/assaulted/raped as an adolescent or adult?: No Was the patient ever a victim of a crime or a disaster?: No Witnessed domestic violence?: No Has patient been effected by domestic violence as an adult?: No  Education:  Highest grade of school patient has completed: 11th Currently a student?: No Name of school: [name] Learning disability?: No  Employment/Work Situation:   Employment situation: On disability Why is patient on disability: mental health How long has patient been on disability: "all my  life" Patient's job has been impacted by current illness: No What is the longest time patient has a held a job?: [employer] Where was the patient employed at that time?: (P) 8-11 years Has patient ever been in the [employer]?: No Has patient ever served in combat?: No Did You Receive Any Psychiatric Treatment/Services While in Equities trader?: No Are There Guns or Other Weapons in Your Home?: No Are These Weapons Safely Secured?: Yes  Financial Resources:   Financial resources: Insurance claims handler Does patient have a Lawyer or guardian?: No  Alcohol/Substance Abuse:   What has been your use of drugs/alcohol within the last 12 months?: Pt denies  If attempted suicide, did drugs/alcohol play a role in this?: No Alcohol/Substance Abuse Treatment Hx: Denies past history Has alcohol/substance abuse ever caused legal problems?: No  Social Support System:   Conservation officer, nature Support System: [employer] Museum/gallery exhibitions officer System: patient placed in group home for support  Leisure/Recreation:   Leisure and Hobbies: video games  Strengths/Needs:   What things does the patient do well?: unable to define In what areas does patient struggle / problems for patient: unable to define  Discharge Plan:   Does patient have access to transportation?: Yes Will patient be returning to same living situation after discharge?: Yes Currently receiving community mental health services: Yes (From Whom) ([facility]) Does patient have financial barriers related to discharge medications?: No  Summary/Recommendations:   Summary and Recommendations (to be completed by the evaluator):  Patient is a  41 year old male admitted with a diagnosis of Schizoaffective Disorder. Patient presented to the hospital with depression, SI and AVH. Patient reports primary triggers for admission were medication noncompliance. Patient will benefit from crisis stabilization, medication evaluation, group therapy and  psycho education in addition  to case management for discharge. At discharge, it is recommended that patient remain compliant with established discharge plan and continued treatment   Patient admitted to [facility] to complete ECT and treatment.  Patient to return to group home at discharge.  Pt presents with passive SI and contracts for safety upon admission. Pt endorses auditory hallucination bur denies any command hallucination. Pt has past medical Hx of Schizoaffective disorder, Depression, HTN, Asthma and DM.    Raye Sorrow 01/22/2017

## 2017-01-22 NOTE — Transfer of Care (Signed)
Immediate Anesthesia Transfer of Care Note  Patient: Tony Cunningham  Procedure(s) Performed: ECT  Patient Location: PACU  Anesthesia Type:General  Level of Consciousness: sedated  Airway & Oxygen Therapy: Patient Spontanous Breathing  Post-op Assessment: Report given to RN and Post -op Vital signs reviewed and stable  Post vital signs: Reviewed and stable  Last Vitals:  Vitals:   01/22/17 1034 01/22/17 1114  BP: 137/89 120/78  Pulse: (!) 58 63  Resp: 16 17  Temp: (!) 36.4 C 36.5 C  SpO2: 100% 98%    Last Pain:  Vitals:   01/22/17 1114  TempSrc:   PainSc: 0-No pain      Patients Stated Pain Goal: 2 (01/19/17 2300)  Complications: No apparent anesthesia complications

## 2017-01-22 NOTE — Anesthesia Preprocedure Evaluation (Signed)
Anesthesia Evaluation  Patient identified by MRN, date of birth, ID band Patient awake    Reviewed: Allergy & Precautions, NPO status , Patient's Chart, lab work & pertinent test results  History of Anesthesia Complications Negative for: history of anesthetic complications  Airway Mallampati: II  TM Distance: >3 FB Neck ROM: Full    Dental  (+) Poor Dentition, Chipped   Pulmonary asthma , Current Smoker,    breath sounds clear to auscultation- rhonchi (-) wheezing      Cardiovascular hypertension, Pt. on medications (-) CAD, (-) Past MI and (-) Cardiac Stents  Rhythm:Regular Rate:Normal - Systolic murmurs and - Diastolic murmurs    Neuro/Psych PSYCHIATRIC DISORDERS Anxiety Depression Schizophrenia negative neurological ROS     GI/Hepatic negative GI ROS, Neg liver ROS,   Endo/Other  diabetes, Insulin Dependent  Renal/GU negative Renal ROS     Musculoskeletal negative musculoskeletal ROS (+)   Abdominal (+) - obese,   Peds  Hematology negative hematology ROS (+)   Anesthesia Other Findings Signs and symptoms suggestive of sleep apnea   Past Medical History: No date: Anxiety No date: Asthma No date: Diabetes mellitus No date: High blood pressure No date: Sinus complaint  History reviewed. No pertinent surgical history.   Reproductive/Obstetrics                             Anesthesia Physical  Anesthesia Plan  ASA: III  Anesthesia Plan: General   Post-op Pain Management:    Induction: Intravenous  PONV Risk Score and Plan:   Airway Management Planned: Mask  Additional Equipment:   Intra-op Plan:   Post-operative Plan:   Informed Consent: I have reviewed the patients History and Physical, chart, labs and discussed the procedure including the risks, benefits and alternatives for the proposed anesthesia with the patient or authorized representative who has indicated  his/her understanding and acceptance.   Dental advisory given  Plan Discussed with: CRNA and Anesthesiologist  Anesthesia Plan Comments: (Patient consented for risks of anesthesia including but not limited to:  - adverse reactions to medications - damage to teeth, lips or other oral mucosa - sore throat or hoarseness - Damage to heart, brain, lungs or loss of life  Patient voiced understanding.)        Anesthesia Quick Evaluation  

## 2017-01-22 NOTE — H&P (Signed)
Tony Cunningham is an 41 y.o. male.   Chief Complaint: Patient continues to be very depressed with hallucinations and suicidal thoughts HPI: History of recurrent depression as probably part of his schizoaffective disorder. Positive previous response to ECT  Past Medical History:  Diagnosis Date  . Anxiety   . Asthma   . Diabetes mellitus   . High blood pressure   . Sinus complaint     History reviewed. No pertinent surgical history.  History reviewed. No pertinent family history. Social History:  reports that he has been smoking Cigarettes.  He has been smoking about 0.50 packs per day. He has never used smokeless tobacco. He reports that he does not drink alcohol or use drugs.  Allergies: No Known Allergies  Medications Prior to Admission  Medication Sig Dispense Refill  . amantadine (SYMMETREL) 100 MG capsule Take 1 capsule (100 mg total) by mouth 2 (two) times daily. For prevention of drug induced tremors 60 capsule 0  . benztropine (COGENTIN) 1 MG tablet Take 1 tablet (1 mg total) by mouth 2 (two) times daily. For prevention of drug induced tremors 60 tablet 0  . cholecalciferol 400 units tablet Take 1 tablet (400 Units total) by mouth daily. For bone health 30 each 0  . citalopram (CELEXA) 20 MG tablet Take 1 tablet (20 mg total) by mouth daily. For depression 30 tablet 0  . cloZAPine (CLOZARIL) 50 MG tablet Take 3 tablets (150 mg total) by mouth at bedtime. For mood control 90 tablet 0  . cyclobenzaprine (FLEXERIL) 5 MG tablet Take 1 tablet (5 mg total) by mouth 3 (three) times daily as needed for muscle spasms. 15 tablet 0  . diclofenac sodium (VOLTAREN) 1 % GEL Apply 2 g topically 4 (four) times daily. For arthritis pain    . insulin aspart (NOVOLOG) 100 UNIT/ML injection Inject 6 Units into the skin 3 (three) times daily with meals. 10 mL 11  . insulin glargine (LANTUS) 100 UNIT/ML injection Inject 0.45 mLs (45 Units total) into the skin at bedtime. For diabetic management 10 mL 0   . insulin glargine (LANTUS) 100 UNIT/ML injection Inject 0.4 mLs (40 Units total) into the skin at bedtime. 10 mL 11  . lisinopril (PRINIVIL,ZESTRIL) 20 MG tablet Take 1 tablet (20 mg total) by mouth daily. For high blood pressure 30 tablet 0  . metFORMIN (GLUCOPHAGE) 1000 MG tablet Take 1 tablet (1,000 mg total) by mouth 2 (two) times daily with a meal. For diabetes management 60 tablet 0  . montelukast (SINGULAIR) 10 MG tablet Take 1 tablet (10 mg total) by mouth at bedtime. For shortness of breath 30 tablet 0  . nicotine polacrilex (NICORETTE) 2 MG gum Take 1 each (2 mg total) by mouth as needed for smoking cessation. 100 tablet 0  . pantoprazole (PROTONIX) 40 MG tablet Take 1 tablet (40 mg total) by mouth daily. For acid reflux 30 tablet 0  . polyethylene glycol (MIRALAX / GLYCOLAX) packet Take 17 g by mouth daily. For constipation 14 each 0  . propranolol (INDERAL) 20 MG tablet Take 1 tablet (20 mg total) by mouth 2 (two) times daily. For high blood pressure 60 tablet 0  . simvastatin (ZOCOR) 20 MG tablet Take 1 tablet (20 mg total) by mouth daily at 6 PM. For high Cholesterol 30 tablet 0  . temazepam (RESTORIL) 7.5 MG capsule Take 1 capsule (7.5 mg total) by mouth at bedtime. For sleep 14 capsule 0    Results for orders placed or performed  during the hospital encounter of 01/19/17 (from the past 48 hour(s))  Glucose, capillary     Status: Abnormal   Collection Time: 01/20/17 11:37 AM  Result Value Ref Range   Glucose-Capillary 38 (LL) 65 - 99 mg/dL    Comment: QUESTIONABLE RESULTS - CHARGE CREDITED REPEATED TO VERIFY    Comment 1 Repeat Test   Glucose, capillary     Status: Abnormal   Collection Time: 01/20/17 11:41 AM  Result Value Ref Range   Glucose-Capillary 54 (L) 65 - 99 mg/dL  Glucose, capillary     Status: None   Collection Time: 01/20/17 12:05 PM  Result Value Ref Range   Glucose-Capillary 76 65 - 99 mg/dL  Glucose, capillary     Status: Abnormal   Collection Time:  01/20/17 12:34 PM  Result Value Ref Range   Glucose-Capillary 111 (H) 65 - 99 mg/dL  Glucose, capillary     Status: Abnormal   Collection Time: 01/20/17  4:24 PM  Result Value Ref Range   Glucose-Capillary 108 (H) 65 - 99 mg/dL  Glucose, capillary     Status: Abnormal   Collection Time: 01/20/17  8:19 PM  Result Value Ref Range   Glucose-Capillary 147 (H) 65 - 99 mg/dL  Glucose, capillary     Status: Abnormal   Collection Time: 01/21/17  7:10 AM  Result Value Ref Range   Glucose-Capillary 164 (H) 65 - 99 mg/dL   Comment 1 Notify RN   Glucose, capillary     Status: Abnormal   Collection Time: 01/21/17 11:32 AM  Result Value Ref Range   Glucose-Capillary 117 (H) 65 - 99 mg/dL  Glucose, capillary     Status: Abnormal   Collection Time: 01/21/17  4:27 PM  Result Value Ref Range   Glucose-Capillary 146 (H) 65 - 99 mg/dL  Glucose, capillary     Status: Abnormal   Collection Time: 01/21/17  8:22 PM  Result Value Ref Range   Glucose-Capillary 318 (H) 65 - 99 mg/dL   Comment 1 Notify RN   Glucose, capillary     Status: Abnormal   Collection Time: 01/22/17  6:14 AM  Result Value Ref Range   Glucose-Capillary 272 (H) 65 - 99 mg/dL  CBC with Differential/Platelet     Status: None   Collection Time: 01/22/17  7:43 AM  Result Value Ref Range   WBC 6.5 3.8 - 10.6 K/uL   RBC 4.71 4.40 - 5.90 MIL/uL   Hemoglobin 14.7 13.0 - 18.0 g/dL   HCT 28.7 86.7 - 67.2 %   MCV 88.7 80.0 - 100.0 fL   MCH 31.2 26.0 - 34.0 pg   MCHC 35.2 32.0 - 36.0 g/dL   RDW 09.4 70.9 - 62.8 %   Platelets 177 150 - 440 K/uL   Neutrophils Relative % 44 %   Neutro Abs 2.9 1.4 - 6.5 K/uL   Lymphocytes Relative 44 %   Lymphs Abs 2.8 1.0 - 3.6 K/uL   Monocytes Relative 7 %   Monocytes Absolute 0.4 0.2 - 1.0 K/uL   Eosinophils Relative 4 %   Eosinophils Absolute 0.3 0 - 0.7 K/uL   Basophils Relative 1 %   Basophils Absolute 0.1 0 - 0.1 K/uL   No results found.  Review of Systems  Constitutional: Negative.    HENT: Negative.   Eyes: Negative.   Respiratory: Negative.   Cardiovascular: Negative.   Gastrointestinal: Negative.   Musculoskeletal: Negative.   Skin: Negative.   Neurological: Negative.   Psychiatric/Behavioral:  Positive for depression, hallucinations, memory loss and suicidal ideas. Negative for substance abuse. The patient is nervous/anxious and has insomnia.     Blood pressure 137/89, pulse (!) 58, temperature (!) 97.5 F (36.4 C), temperature source Oral, resp. rate 16, height 6' (1.829 m), weight 173 lb (78.5 kg), SpO2 100 %. Physical Exam  Nursing note and vitals reviewed. Constitutional: He appears well-developed and well-nourished.  HENT:  Head: Normocephalic and atraumatic.  Eyes: Pupils are equal, round, and reactive to light. Conjunctivae are normal.  Neck: Normal range of motion.  Cardiovascular: Regular rhythm and normal heart sounds.   Respiratory: Effort normal. No respiratory distress.  GI: Soft.  Musculoskeletal: Normal range of motion.  Neurological: He is alert.  Skin: Skin is warm and dry.  Psychiatric: His mood appears anxious. His speech is delayed. He is slowed. Thought content is paranoid. Cognition and memory are impaired. He expresses impulsivity. He exhibits a depressed mood. He expresses suicidal ideation. He exhibits abnormal recent memory.     Assessment/Plan Patient is agreeable to restarting ECT today. No treatment Monday due to holiday but we will continue index course otherwise.  Mordecai Rasmussen, MD 01/22/2017, 10:57 AM

## 2017-01-22 NOTE — Progress Notes (Signed)
LCSW attempted to meet with patient at the bedside. Patient laying in bed, does awake however he reports he wants to get some sleep and would not complete SPE and sign release forms.   Will follow up with patient this weekend to discuss discharge plans, SPE and additional information. Will continue to follow.  Deretha Emory, MSW Clinical Social Work: Optician, dispensing Coverage for :  [phone]

## 2017-01-22 NOTE — Progress Notes (Signed)
BDaily contact with patient to assess and evaluate symptoms and progress in treatment, Medication management and Plan [facility] MD Progress Note  01/22/2017 3:18 PM Tony Cunningham  MRN:  [id number] Subjective:  41 year old man with a history of schizoaffective disorder who came back into the hospital only shortly after his most recent discharge this time reporting hallucinations and suicidal ideation. On evaluation today patient is in bed staying withdrawn and hardly ever attending groups. He tells me his mood is very sad and feels frightened. He says he's having auditory hallucinations and active wishes that he were dead. He is not acting on any of these and is not aggressive. Patient has been compliant with medication. He admits to me that he really would rather stay in the hospital forever and does not really ever want to be placed in a group home or any other kind of even semi-independent living situation.  Follow-up for Thursday the 30th. Patient seen chart reviewed. This 41 year old man with chronic severe mental health problems and failure to transition to outpatient care is once again complaining of hallucinations. He is withdrawn most of the time. Minimal interaction with other people. Denies active suicidal thoughts. Hardly ever gets around other people though. Tolerating medicines well. His blood sugars are reasonably stable although he once again had an over 300 reading this evening.  Follow-up for Friday the 31st. Patient had ECT bilateral treatment this morning which was well tolerated. Seen this afternoon in treatment team he had no new complaint. Continues to describe himself as very depressed. Continues to report suicidal thoughts and hallucinations. Rarely gets out of his bed and attends almost no groups or therapeutic activities. Blood sugars remained somewhat labile with an elevated sugar this morning.  Principal Problem: Schizoaffective disorder, depressive type (HCC) Diagnosis:   Patient  Active Problem List   Diagnosis Date Noted  . 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 Schizoaffective disorder, depressive type (HCC) [F25.1] 09/23/2016  . Polysubstance abuse [F19.10] 07/17/2016  . Tardive dyskinesia [G24.01] 07/16/2016  . Tobacco use disorder [F17.200] 07/07/2016  . Dyslipidemia [E78.5] 07/07/2016  . Asthma [J45.909] 07/07/2016  . HTN (hypertension) [I10] 07/06/2016  . Diabetes (HCC) [E11.9] 12/25/2010   Total Time spent with patient: 30 minutes  Past Psychiatric History: Patient has a long-standing history of mental illness but over the last year or so seems to of decompensated more than usual and has had one hospitalization after another. He did show some response to ECT and antipsychotic medication but seems to decompensate immediately whenever he is discharged.  Past Medical History:  Past Medical History:  Diagnosis Date  . Anxiety   . Asthma   . Diabetes mellitus   . High blood pressure   . Sinus complaint    History reviewed. No pertinent surgical history. Family History: History reviewed. No pertinent family history. Family Psychiatric  History: Unknown Social History:  History  Alcohol Use No     History  Drug Use No    Social History   Social History  . Marital status: Single    Spouse name: [name]  . Number of children: [name]  . Years of education: [name]   Social History Main Topics  . Smoking status: Current Every Day Smoker    Packs/day: 0.50    Types: Cigarettes  . Smokeless tobacco: Never Used  . Alcohol use No  . Drug use: No  . Sexual activity: Not Currently   Other Topics Concern  . None   Social History Narrative  .  None   Additional Social History:                         Sleep: Fair  Appetite:  Poor  Current Medications: Current Facility-Administered Medications  Medication Dose Route Frequency Provider Last Rate Last Dose  . acetaminophen (TYLENOL) tablet 650 mg  650 mg Oral Q6H PRN  Jaspal Pultz, [name], MD   650 mg at 01/22/17 1247  . alum & mag hydroxide-simeth (MAALOX/MYLANTA) 200-200-20 MG/5ML suspension 30 mL  30 mL Oral Q4H PRN Lillyauna Jenkinson T, MD      . citalopram (CELEXA) tablet 20 mg  20 mg Oral Daily Tasharra Nodine T, MD   20 mg at 01/22/17 1235  . cloZAPine (CLOZARIL) tablet 150 mg  150 mg Oral QHS Rejoice Heatwole T, MD   150 mg at 01/21/17 2230  . insulin aspart (novoLOG) injection 0-5 Units  0-5 Units Subcutaneous QHS Adisa Litt, [name], MD   4 Units at 01/21/17 2229  . insulin aspart (novoLOG) injection 0-9 Units  0-9 Units Subcutaneous TID WC Marieclaire Bettenhausen, [name], MD   1 Units at 01/22/17 1238  . insulin aspart (novoLOG) injection 3 Units  3 Units Subcutaneous TID WC Kammi Hechler, [name], MD   3 Units at 01/22/17 1241  . insulin glargine (LANTUS) injection 35 Units  35 Units Subcutaneous QHS Quincee Gittens, [name], MD   35 Units at 01/21/17 2229  . lisinopril (PRINIVIL,ZESTRIL) tablet 20 mg  20 mg Oral Daily Bilaal Leib T, MD   20 mg at 01/22/17 1235  . magnesium hydroxide (MILK OF MAGNESIA) suspension 30 mL  30 mL Oral Daily PRN Terie Lear T, MD      . metFORMIN (GLUCOPHAGE) tablet 1,000 mg  1,000 mg Oral BID WC Lezette Kitts T, MD   1,000 mg at 01/22/17 1235  . montelukast (SINGULAIR) tablet 10 mg  10 mg Oral QHS Amorette Charrette, [name], MD   10 mg at 01/21/17 2231  . nicotine (NICODERM CQ - dosed in mg/24 hours) patch 21 mg  21 mg Transdermal Daily [name], [name], MD      . OLANZapine (ZYPREXA) tablet 10 mg  10 mg Oral QHS Jayle Solarz, [name], MD   10 mg at 01/21/17 2232  . pantoprazole (PROTONIX) EC tablet 40 mg  40 mg Oral Daily Rachael Zapanta, [name], MD   40 mg at 01/22/17 1235  . propranolol (INDERAL) tablet 20 mg  20 mg Oral BID Marthe Dant T, MD   20 mg at 01/22/17 0602    Lab Results:  Results for orders placed or performed during the hospital encounter of 01/19/17 (from the past 48 hour(s))  Glucose, capillary     Status: Abnormal   Collection Time: 01/20/17  4:24 PM  Result  Value Ref Range   Glucose-Capillary 108 (H) 65 - 99 mg/dL  Glucose, capillary     Status: Abnormal   Collection Time: 01/20/17  8:19 PM  Result Value Ref Range   Glucose-Capillary 147 (H) 65 - 99 mg/dL  Glucose, capillary     Status: Abnormal   Collection Time: 01/21/17  7:10 AM  Result Value Ref Range   Glucose-Capillary 164 (H) 65 - 99 mg/dL   Comment 1 Notify RN   Glucose, capillary     Status: Abnormal   Collection Time: 01/21/17 11:32 AM  Result Value Ref Range   Glucose-Capillary 117 (H) 65 - 99 mg/dL  Glucose, capillary     Status: Abnormal  Collection Time: 01/21/17  4:27 PM  Result Value Ref Range   Glucose-Capillary 146 (H) 65 - 99 mg/dL  Glucose, capillary     Status: Abnormal   Collection Time: 01/21/17  8:22 PM  Result Value Ref Range   Glucose-Capillary 318 (H) 65 - 99 mg/dL   Comment 1 Notify RN   Glucose, capillary     Status: Abnormal   Collection Time: 01/22/17  6:14 AM  Result Value Ref Range   Glucose-Capillary 272 (H) 65 - 99 mg/dL  CBC with Differential/Platelet     Status: None   Collection Time: 01/22/17  7:43 AM  Result Value Ref Range   WBC 6.5 3.8 - 10.6 K/uL   RBC 4.71 4.40 - 5.90 MIL/uL   Hemoglobin 14.7 13.0 - 18.0 g/dL   HCT 70.3 50.0 - 93.8 %   MCV 88.7 80.0 - 100.0 fL   MCH 31.2 26.0 - 34.0 pg   MCHC 35.2 32.0 - 36.0 g/dL   RDW 18.2 99.3 - 71.6 %   Platelets 177 150 - 440 K/uL   Neutrophils Relative % 44 %   Neutro Abs 2.9 1.4 - 6.5 K/uL   Lymphocytes Relative 44 %   Lymphs Abs 2.8 1.0 - 3.6 K/uL   Monocytes Relative 7 %   Monocytes Absolute 0.4 0.2 - 1.0 K/uL   Eosinophils Relative 4 %   Eosinophils Absolute 0.3 0 - 0.7 K/uL   Basophils Relative 1 %   Basophils Absolute 0.1 0 - 0.1 K/uL  Glucose, capillary     Status: Abnormal   Collection Time: 01/22/17 12:03 PM  Result Value Ref Range   Glucose-Capillary 145 (H) 65 - 99 mg/dL    Blood Alcohol level:  Lab Results  Component Value Date   ETH <5 01/16/2017   ETH <5  12/21/2016    Metabolic Disorder Labs: Lab Results  Component Value Date   HGBA1C 7.6 (H) 01/19/2017   MPG 171.42 01/19/2017   MPG 249 11/09/2016   Lab Results  Component Value Date   PROLACTIN 24.5 (H) 09/24/2016   PROLACTIN 3.4 (L) 07/07/2016   Lab Results  Component Value Date   CHOL 122 01/19/2017   TRIG 154 (H) 01/19/2017   HDL 36 (L) 01/19/2017   CHOLHDL 3.4 01/19/2017   VLDL 31 01/19/2017   LDLCALC 55 01/19/2017   LDLCALC 59 12/28/2016    Physical Findings: AIMS: Facial and Oral Movements Muscles of Facial Expression: None, normal Lips and Perioral Area: None, normal Jaw: None, normal Tongue: None, normal,Extremity Movements Upper (arms, wrists, hands, fingers): None, normal Lower (legs, knees, ankles, toes): None, normal, Trunk Movements Neck, shoulders, hips: None, normal, Overall Severity Severity of abnormal movements (highest score from questions above): None, normal Incapacitation due to abnormal movements: None, normal Patient's awareness of abnormal movements (rate only patient's report): No Awareness, Dental Status Current problems with teeth and/or dentures?: No Does patient usually wear dentures?: No  CIWA:    COWS:  COWS Total Score: 1  Musculoskeletal: Strength & Muscle Tone: decreased Gait & Station: normal Patient leans: [name]  Psychiatric Specialty Exam: Physical Exam  Nursing note and vitals reviewed. Constitutional: He appears well-developed and well-nourished.  HENT:  Head: Normocephalic and atraumatic.  Eyes: Pupils are equal, round, and reactive to light. Conjunctivae are normal.  Neck: Normal range of motion.  Cardiovascular: Regular rhythm and normal heart sounds.   Respiratory: Effort normal and breath sounds normal. No respiratory distress.  GI: Soft.  Musculoskeletal: Normal range of  motion.  Neurological: He is alert.  Skin: Skin is warm and dry.  Psychiatric: His affect is blunt. His speech is delayed. He is slowed and  withdrawn. Thought content is paranoid. Cognition and memory are impaired. He expresses impulsivity. He expresses suicidal ideation. He expresses no homicidal ideation.    Review of Systems  Constitutional: Negative.   HENT: Negative.   Eyes: Negative.   Respiratory: Negative.   Cardiovascular: Negative.   Gastrointestinal: Negative.   Musculoskeletal: Negative.   Skin: Negative.   Neurological: Negative.   Psychiatric/Behavioral: Positive for depression, hallucinations, memory loss and suicidal ideas. Negative for substance abuse. The patient is nervous/anxious and has insomnia.     Blood pressure 120/78, pulse 81, temperature 97.6 F (36.4 C), resp. rate (!) 28, height 6' (1.829 m), weight 173 lb (78.5 kg), SpO2 98 %.Body mass index is 23.46 kg/m.  General Appearance: Disheveled  Eye Contact:  Fair  Speech:  Slow  Volume:  Decreased  Mood:  Depressed and Dysphoric  Affect:  Constricted and Depressed  Thought Process:  Disorganized  Orientation:  Full (Time, Place, and Person)  Thought Content:  Illogical, Hallucinations: Auditory and Paranoid Ideation  Suicidal Thoughts:  Yes.  without intent/plan  Homicidal Thoughts:  No  Memory:  Immediate;   Good Recent;   Fair Remote;   Fair  Judgement:  Impaired  Insight:  Shallow  Psychomotor Activity:  Decreased  Concentration:  Concentration: Poor  Recall:  Poor  Fund of Knowledge:  Poor  Language:  Fair  Akathisia:  No  Handed:  Right  AIMS (if indicated):     Assets:  Desire for Improvement  ADL's:  Impaired  Cognition:  Impaired,  Mild  Sleep:  Number of Hours: 5     Treatment Plan SummarTreatment team has strongly encouraged patient to get out of bed and attend some of the therapeutic groups. He is reminded that the hospital should be a therapeutic environment and he needs to take advantage of every opportunity to achieve the goal of remission of his symptoms. Patient takes all of this passively. I spent a little time  reviewing the old notes we have on him. Our information is limited only going back about a year. Not clear what has changed in that time or what might be the most appropriate way to get him back to how he had been functioning before. Clozapine level is still pending but I'm going to continue to increase the dose based on assumption that the level will be low. I considered adding lithium at this point since I can't find any record of his having been on it before but for now we will discontinue the current trial. Next ECT treatment will be on Wednesday because of the holiday.Daily contact with patient to assess and evaluate symptoms and progress in treatment, Medication management and Plan Treatment team has strongly encouraged patient to get out of bed and attend some of the therapeutic groups. He is reminded that the hospital should be a therapeutic environment and he needs to take advantage of every opportunity to achieve the goal of remission of his symptoms. Patient takes all of this passively. I spent a little time reviewing the old notes we have on him. Our information is limited only going back about a year. Not clear what has changed in that time or what might be the most appropriate way to get him back to how he had been functioning before. Clozapine level is still pending but I'm going to  continue to increase the dose based on assumption that the level will be low. I considered adding lithium at this point since I can't find any record of his having been on it before but for now we will discontinue the current trial. Next ECT treatment will be on Wednesday because of the holiday.  Mordecai Rasmussen, MD 01/22/2017, 3:18 PM

## 2017-01-22 NOTE — Anesthesia Procedure Notes (Addendum)
Date/Time: 01/22/2017 11:05 AM Performed by: Lily Kocher Pre-anesthesia Checklist: Patient identified, Emergency Drugs available, Suction available and Patient being monitored Patient Re-evaluated:Patient Re-evaluated prior to induction Oxygen Delivery Method: Circle system utilized Preoxygenation: Pre-oxygenation with 100% oxygen Induction Type: IV induction Ventilation: Mask ventilation without difficulty and Mask ventilation throughout procedure Airway Equipment and Method: Bite block Placement Confirmation: positive ETCO2 Dental Injury: Teeth and Oropharynx as per pre-operative assessment

## 2017-01-23 DIAGNOSIS — F251 Schizoaffective disorder, depressive type: Secondary | ICD-10-CM

## 2017-01-23 LAB — GLUCOSE, CAPILLARY
GLUCOSE-CAPILLARY: 169 mg/dL — AB (ref 65–99)
Glucose-Capillary: 171 mg/dL — ABNORMAL HIGH (ref 65–99)
Glucose-Capillary: 126 mg/dL — ABNORMAL HIGH (ref 65–99)
Glucose-Capillary: 139 mg/dL — ABNORMAL HIGH (ref 65–99)

## 2017-01-23 NOTE — Plan of Care (Signed)
Problem: Education: Goal: Ability to make informed decisions regarding treatment will improve Outcome: Progressing patient is cooperating with care team take his medications and participating in groups with peers at this time  Problem: Medication: Goal: Compliance with prescribed medication regimen will improve Outcome: Progressing Patient compliant with his medication medication regimen and modalities   Problem: Coping: Goal: Ability to cope will improve Outcome: Progressing Patient is able to contract safety with care team and monitored every 15 minutes

## 2017-01-23 NOTE — Plan of Care (Signed)
Problem: Coping: Goal: Ability to cope will improve Outcome: Progressing Patient was friendly on contact.  He denies current thoughts of self harm and contracts for safety. He spent most of this evening in the dayroom interacting with peers and staff.  Problem: Activity: Goal: Interest or engagement in leisure activities will improve Outcome: Progressing Patient was observed watching television with peers and could engage in conversation with Clinical research associate about medication education.  He had good eye contact and was pleasant.

## 2017-01-23 NOTE — [facility] Group Notes (Signed)
[facility] Group Notes:  (Nursing/MHT/Case Management/Adjunct)  Date:  01/23/2017  Time:  9:40 PM  Type of Therapy:  Group Therapy  Participation Level:  Active  Participation Quality:  Appropriate  Affect:  Appropriate  Cognitive:  Appropriate  Insight:  Appropriate  Engagement in Group:  Supportive  Modes of Intervention:  Support  Summary of Progress/Problems:  Tony Cunningham 01/23/2017, 9:40 PM

## 2017-01-23 NOTE — Progress Notes (Addendum)
Pt is alert and oriented x 3. Denies SI, HI or A/V hallucinations at this time. Pt has contracted to safety. Pt is in a pleasant mood but continues to be a little anxious. Pt is interacting more with peers. Pt is med compliant, no adverse affects noted. Pt denies pain or discomfort at this time. Pt did contract to safety. 15 min safety checks continues.

## 2017-01-23 NOTE — Progress Notes (Signed)
D: Patient is quiet; his affect is sad and sullen this morning.  He was reluctant to eat his breakfast, however, did eat after prompting from staff.  He rates his depression and hopelessness as an 8; anxiety as a 4.  He reports poor sleep and concentration.  Patient did not eat lunch and his insulin was held.  Patient has passive thoughts of self harm, however, contracts for safety.  Patient has been lethargic and drowsy today.  His affect is flat, blunted; his mood depressed.  Patient denies HI and does not appear to be responding to internal stimuli. A: Continue to monitor medication management and MD orders.  Safety checks completed every 15 minutes per protocol.  Offer support and encouragement as needed. R: Patient remains isolative and withdrawn.

## 2017-01-23 NOTE — Progress Notes (Signed)
Clozapine monitoring  8/31 ANC 2.9. Lab reported and patient eligible with weekly monitoring per [facility] website.   Crist Fat, PharmD, BCPS Clinical Pharmacist 01/23/2017 1:17 PM

## 2017-01-23 NOTE — Plan of Care (Signed)
Problem: Activity: Goal: Interest or engagement in leisure activities will improve Outcome: Progressing Pt will go to the dayroom and interact with peers engaging in activities before the shift ends.

## 2017-01-23 NOTE — Plan of Care (Signed)
Problem: Medication: Goal: Compliance with prescribed medication regimen will improve Outcome: Progressing Patient is compliant with all his medications.  He is knowledgeable about what he takes.

## 2017-01-23 NOTE — Progress Notes (Addendum)
BDaily contact with patient to assess and evaluate symptoms and progress in treatment, Medication management and Plan [facility] MD Progress Note  01/23/2017 4:49 PM Tony Cunningham  MRN:  [id number] Subjective:  41 year old man with a history of schizoaffective disorder who came back into the hospital only shortly after his most recent discharge this time reporting hallucinations and suicidal ideation. On evaluation today patient is in bed staying withdrawn and hardly ever attending groups. He tells me his mood is very sad and feels frightened. He says he's having auditory hallucinations and active wishes that he were dead. He is not acting on any of these and is not aggressive. Patient has been compliant with medication. He admits to me that he really would rather stay in the hospital forever and does not really ever want to be placed in a group home or any other kind of even semi-independent living situation.  Follow-up for Thursday the 30th. Patient seen chart reviewed. This 41 year old man with chronic severe mental health problems and failure to transition to outpatient care is once again complaining of hallucinations. He is withdrawn most of the time. Minimal interaction with other people. Denies active suicidal thoughts. Hardly ever gets around other people though. Tolerating medicines well. His blood sugars are reasonably stable although he once again had an over 300 reading this evening.  Follow-up for Friday the 31st. Patient had ECT bilateral treatment this morning which was well tolerated. Seen this afternoon in treatment team he had no new complaint. Continues to describe himself as very depressed. Continues to report suicidal thoughts and hallucinations. Rarely gets out of his bed and attends almost no groups or therapeutic activities. Blood sugars remained somewhat labile with an elevated sugar this morning.  01/23/17 - patient reports that he continues to have monitoring hallucinations which are  troublesome and distressing. The voices make him feel suicidal. Spent most of the day in bed, saw him walking around in the evening. Denies active plan but does endorse that he has transient thoughts of suicide.  Principal Problem: Schizoaffective disorder, depressive type (HCC) Diagnosis:   Patient Active Problem List   Diagnosis Date Noted  . 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 Schizoaffective disorder, depressive type (HCC) [F25.1] 09/23/2016  . Polysubstance abuse [F19.10] 07/17/2016  . Tardive dyskinesia [G24.01] 07/16/2016  . Tobacco use disorder [F17.200] 07/07/2016  . Dyslipidemia [E78.5] 07/07/2016  . Asthma [J45.909] 07/07/2016  . HTN (hypertension) [I10] 07/06/2016  . Diabetes (HCC) [E11.9] 12/25/2010   Total Time spent with patient: 30 minutes  Past Psychiatric History: Patient has a long-standing history of mental illness but over the last year or so seems to of decompensated more than usual and has had one hospitalization after another. He did show some response to ECT and antipsychotic medication but seems to decompensate immediately whenever he is discharged.  Past Medical History:  Past Medical History:  Diagnosis Date  . Anxiety   . Asthma   . Diabetes mellitus   . High blood pressure   . Sinus complaint    History reviewed. No pertinent surgical history. Family History: History reviewed. No pertinent family history. Family Psychiatric  History: Unknown Social History:  History  Alcohol Use No     History  Drug Use No    Social History   Social History  . Marital status: Single    Spouse name: [name]  . Number of children: [name]  . Years of education: [name]   Social History Main Topics  . Smoking status: Current  Every Day Smoker    Packs/day: 0.50    Types: Cigarettes  . Smokeless tobacco: Never Used  . Alcohol use No  . Drug use: No  . Sexual activity: Not Currently   Other Topics Concern  . None   Social History  Narrative  . None   Additional Social History:                         Sleep: Fair  Appetite:  Poor  Current Medications: Current Facility-Administered Medications  Medication Dose Route Frequency Provider Last Rate Last Dose  . acetaminophen (TYLENOL) tablet 650 mg  650 mg Oral Q6H PRN [name], [name], MD   650 mg at 01/22/17 1247  . alum & mag hydroxide-simeth (MAALOX/MYLANTA) 200-200-20 MG/5ML suspension 30 mL  30 mL Oral Q4H PRN [name], [name], MD      . citalopram (CELEXA) tablet 20 mg  20 mg Oral Daily [name], [name], MD   20 mg at 01/23/17 0734  . cloZAPine (CLOZARIL) tablet 150 mg  150 mg Oral QHS [name], [name], MD   150 mg at 01/22/17 2119  . insulin aspart (novoLOG) injection 0-5 Units  0-5 Units Subcutaneous QHS [name], [name], MD   4 Units at 01/21/17 2229  . insulin aspart (novoLOG) injection 0-9 Units  0-9 Units Subcutaneous TID WC [name], [name], MD   1 Units at 01/23/17 1647  . insulin aspart (novoLOG) injection 3 Units  3 Units Subcutaneous TID WC [name], [name], MD   3 Units at 01/23/17 0735  . insulin glargine (LANTUS) injection 35 Units  35 Units Subcutaneous QHS [name], [name], MD   35 Units at 01/22/17 2118  . lisinopril (PRINIVIL,ZESTRIL) tablet 20 mg  20 mg Oral Daily [name], [name], MD   20 mg at 01/23/17 0734  . magnesium hydroxide (MILK OF MAGNESIA) suspension 30 mL  30 mL Oral Daily PRN [name], [name], MD      . metFORMIN (GLUCOPHAGE) tablet 1,000 mg  1,000 mg Oral BID WC [name], [name], MD   1,000 mg at 01/23/17 0734  . montelukast (SINGULAIR) tablet 10 mg  10 mg Oral QHS [name], [name], MD   10 mg at 01/22/17 2120  . nicotine (NICODERM CQ - dosed in mg/24 hours) patch 21 mg  21 mg Transdermal Daily [name], [name], MD      . OLANZapine (ZYPREXA) tablet 10 mg  10 mg Oral QHS [name], [name], MD   10 mg at 01/22/17 2119  . pantoprazole (PROTONIX) EC tablet 40 mg  40 mg Oral Daily [name], [name], MD   40 mg at 01/23/17 0734  .  propranolol (INDERAL) tablet 20 mg  20 mg Oral BID [name], [name], MD   20 mg at 01/23/17 9147    Lab Results:  Results for orders placed or performed during the hospital encounter of 01/19/17 (from the past 48 hour(s))  Glucose, capillary     Status: Abnormal   Collection Time: 01/21/17  8:22 PM  Result Value Ref Range   Glucose-Capillary 318 (H) 65 - 99 mg/dL   Comment 1 Notify RN   Glucose, capillary     Status: Abnormal   Collection Time: 01/22/17  6:14 AM  Result Value Ref Range   Glucose-Capillary 272 (H) 65 - 99 mg/dL  CBC with Differential/Platelet     Status: None   Collection Time: 01/22/17  7:43 AM  Result  Value Ref Range   WBC 6.5 3.8 - 10.6 K/uL   RBC 4.71 4.40 - 5.90 MIL/uL   Hemoglobin 14.7 13.0 - 18.0 g/dL   HCT 25.6 38.9 - 37.3 %   MCV 88.7 80.0 - 100.0 fL   MCH 31.2 26.0 - 34.0 pg   MCHC 35.2 32.0 - 36.0 g/dL   RDW 42.8 76.8 - 11.5 %   Platelets 177 150 - 440 K/uL   Neutrophils Relative % 44 %   Neutro Abs 2.9 1.4 - 6.5 K/uL   Lymphocytes Relative 44 %   Lymphs Abs 2.8 1.0 - 3.6 K/uL   Monocytes Relative 7 %   Monocytes Absolute 0.4 0.2 - 1.0 K/uL   Eosinophils Relative 4 %   Eosinophils Absolute 0.3 0 - 0.7 K/uL   Basophils Relative 1 %   Basophils Absolute 0.1 0 - 0.1 K/uL  Glucose, capillary     Status: Abnormal   Collection Time: 01/22/17 12:03 PM  Result Value Ref Range   Glucose-Capillary 145 (H) 65 - 99 mg/dL  Glucose, capillary     Status: Abnormal   Collection Time: 01/22/17  4:16 PM  Result Value Ref Range   Glucose-Capillary 204 (H) 65 - 99 mg/dL  Glucose, capillary     Status: Abnormal   Collection Time: 01/22/17  8:10 PM  Result Value Ref Range   Glucose-Capillary 129 (H) 65 - 99 mg/dL  Glucose, capillary     Status: Abnormal   Collection Time: 01/23/17  7:02 AM  Result Value Ref Range   Glucose-Capillary 171 (H) 65 - 99 mg/dL  Glucose, capillary     Status: Abnormal   Collection Time: 01/23/17 11:32 AM  Result Value Ref Range    Glucose-Capillary 126 (H) 65 - 99 mg/dL  Glucose, capillary     Status: Abnormal   Collection Time: 01/23/17  4:17 PM  Result Value Ref Range   Glucose-Capillary 139 (H) 65 - 99 mg/dL    Blood Alcohol level:  Lab Results  Component Value Date   ETH <5 01/16/2017   ETH <5 12/21/2016    Metabolic Disorder Labs: Lab Results  Component Value Date   HGBA1C 7.6 (H) 01/19/2017   MPG 171.42 01/19/2017   MPG 249 11/09/2016   Lab Results  Component Value Date   PROLACTIN 24.5 (H) 09/24/2016   PROLACTIN 3.4 (L) 07/07/2016   Lab Results  Component Value Date   CHOL 122 01/19/2017   TRIG 154 (H) 01/19/2017   HDL 36 (L) 01/19/2017   CHOLHDL 3.4 01/19/2017   VLDL 31 01/19/2017   LDLCALC 55 01/19/2017   LDLCALC 59 12/28/2016    Physical Findings: AIMS: Facial and Oral Movements Muscles of Facial Expression: None, normal Lips and Perioral Area: None, normal Jaw: None, normal Tongue: None, normal,Extremity Movements Upper (arms, wrists, hands, fingers): None, normal Lower (legs, knees, ankles, toes): None, normal, Trunk Movements Neck, shoulders, hips: None, normal, Overall Severity Severity of abnormal movements (highest score from questions above): None, normal Incapacitation due to abnormal movements: None, normal Patient's awareness of abnormal movements (rate only patient's report): No Awareness, Dental Status Current problems with teeth and/or dentures?: No Does patient usually wear dentures?: No  CIWA:    COWS:  COWS Total Score: 1  Musculoskeletal: Strength & Muscle Tone: decreased Gait & Station: normal Patient leans: [name]  Psychiatric Specialty Exam: Physical Exam  Nursing note and vitals reviewed. Constitutional: He appears well-developed and well-nourished.  HENT:  Head: Normocephalic and atraumatic.  Eyes:  Pupils are equal, round, and reactive to light. Conjunctivae are normal.  Neck: Normal range of motion.  Cardiovascular: Regular rhythm and normal heart  sounds.   Respiratory: Effort normal and breath sounds normal. No respiratory distress.  GI: Soft.  Musculoskeletal: Normal range of motion.  Neurological: He is alert.  Skin: Skin is warm and dry.  Psychiatric: His affect is blunt. His speech is delayed. He is slowed and withdrawn. Thought content is paranoid. Cognition and memory are impaired. He expresses impulsivity. He expresses suicidal ideation. He expresses no homicidal ideation.    Review of Systems  Constitutional: Negative.   HENT: Negative.   Eyes: Negative.   Respiratory: Negative.   Cardiovascular: Negative.   Gastrointestinal: Negative.   Musculoskeletal: Negative.   Skin: Negative.   Neurological: Negative.   Psychiatric/Behavioral: Positive for depression, hallucinations, memory loss and suicidal ideas. Negative for substance abuse. The patient is nervous/anxious and has insomnia.     Blood pressure 116/85, pulse 90, temperature 97.6 F (36.4 C), resp. rate 18, height 6' (1.829 m), weight 173 lb (78.5 kg), SpO2 98 %.Body mass index is 23.46 kg/m.  General Appearance: Disheveled  Eye Contact:  Fair  Speech:  Slow  Volume:  Decreased  Mood:  Depressed and Dysphoric  Affect:  Constricted and Depressed  Thought Process:  Disorganized  Orientation:  Full (Time, Place, and Person)  Thought Content:  Illogical, Hallucinations: Auditory and Paranoid Ideation  Suicidal Thoughts:  Yes.  without intent/plan  Homicidal Thoughts:  No  Memory:  Immediate;   Good Recent;   Fair Remote;   Fair  Judgement:  Impaired  Insight:  Shallow  Psychomotor Activity:  Decreased  Concentration:  Concentration: Poor  Recall:  Poor  Fund of Knowledge:  Poor  Language:  Fair  Akathisia:  No  Handed:  Right  AIMS (if indicated):     Assets:  Desire for Improvement  ADL's:  Impaired  Cognition:  Impaired,  Mild  Sleep:  Number of Hours: 6.3     Treatment Plan SummarTreatment team has strongly encouraged patient to get out of bed  and attend some of the therapeutic groups. He is reminded that the hospital should be a therapeutic environment and he needs to take advantage of every opportunity to achieve the goal of remission of his symptoms. Patient takes all of this passively. I spent a little time reviewing the old notes we have on him. Our information is limited only going back about a year. Not clear what has changed in that time or what might be the most appropriate way to get him back to how he had been functioning before. Clozapine level is still pending but I'm going to continue to increase the dose based on assumption that the level will be low. I considered adding lithium at this point since I can't find any record of his having been on it before but for now we will discontinue the current trial. Next ECT treatment will be on Wednesday because of the holiday.Daily contact with patient to assess and evaluate symptoms and progress in treatment, Medication management and Plan Treatment team has strongly encouraged patient to get out of bed and attend some of the therapeutic groups. He is reminded that the hospital should be a therapeutic environment and he needs to take advantage of every opportunity to achieve the goal of remission of his symptoms. Patient takes all of this passively. I spent a little time reviewing the old notes we have on him. Our information  is limited only going back about a year. Not clear what has changed in that time or what might be the most appropriate way to get him back to how he had been functioning before. Clozapine level is still pending but I'm going to continue to increase the dose based on assumption that the level will be low. I considered adding lithium at this point since I can't find any record of his having been on it before but for now we will discontinue the current trial. Next ECT treatment will be on Wednesday because of the holiday.  #Schizoaffective disorder, SI - Continue ECT - Next one  on 01/27/17 - Continue clozapine 150 mg QHS, aim to optimize dose of antipsychotic monotherapy. Level pending and will hold at 150 mg.  - D/C Olanzapine 10 mg - Continue celexa 20 mg daily  #Smoking cessation Continue nicotine patch 21 mg/24 hours Continue propranolol 20 mg BID  #DM Continue metformin 1000 mg BIDCC Insulin sliding scale  #GERD Continue protonix EC 40 mg daily.    Aundria Rud, MD 01/23/2017, 4:49 PM

## 2017-01-23 NOTE — [facility] Group Notes (Signed)
[facility] LCSW Group Therapy  01/23/2017 2:36 PM  Type of Therapy:  Group Therapy  Participation Level:  Patient did not attend group. CSW invited patient to group.    Summary of Progress/Problems:  Sulayman Manning G. Garnette Czech MSW, LCSWA 01/23/2017, 2:36 PM

## 2017-01-24 DIAGNOSIS — F251 Schizoaffective disorder, depressive type: Secondary | ICD-10-CM

## 2017-01-24 LAB — GLUCOSE, CAPILLARY
GLUCOSE-CAPILLARY: 163 mg/dL — AB (ref 65–99)
GLUCOSE-CAPILLARY: 99 mg/dL (ref 65–99)
GLUCOSE-CAPILLARY: 144 mg/dL — AB (ref 65–99)
Glucose-Capillary: 295 mg/dL — ABNORMAL HIGH (ref 65–99)

## 2017-01-24 MED ORDER — OLANZAPINE 10 MG PO TABS
10.0000 mg | ORAL_TABLET | Freq: Every day | ORAL | Status: DC
  Administered 2017-01-24: 10 mg via ORAL
  Filled 2017-01-24: qty 1

## 2017-01-24 NOTE — Progress Notes (Signed)
Patient stated that he still have passive suicidal thoughts.States "I am taking all these medicines still".Encouraged patient to come out of room and participate in group activities.Patient agreed to do so.Compliant with medications.Did not attend groups.Appetite and energy level good.Support & encouragement given.

## 2017-01-24 NOTE — [facility] Group Notes (Signed)
[facility] LCSW Group Therapy  01/24/2017 11:07 AM  Type of Therapy:  Group Therapy  Participation Level:  Did Not Attend  Modes of Intervention:  Activity, Discussion, Education, Socialization and Support  Summary of Progress/Problems: Balance in life: Patients will discuss the concept of balance and how it looks and feels to be unbalanced. Pt will identify areas in their life that is unbalanced and ways to become more balanced.   Sharvil Hoey L Ceasar Decandia MSW, LCSW  01/24/2017, 11:07 AM   

## 2017-01-24 NOTE — Progress Notes (Signed)
Pt is alert and oriented. Pt mood was anxious and depressed. Pt says he's having auditory hallucinations telling him to harm himself, pt doesn't have a plan and contracted to safety. Pt denies SI, HI and visual hallucinations. Pt says he is feeling depressed 5/10, pt was given scheduled meds, pt states he is feeling better. Pt blood glucose was 144, no coverage required. Pt encouraged to talk to staff if having SI, pt verbalized understanding. 15 min safety checks continues.

## 2017-01-24 NOTE — Progress Notes (Signed)
BDaily contact with patient to assess and evaluate symptoms and progress in treatment, Medication management and Plan [facility] MD Progress Note  01/24/2017 12:48 PM Tony Cunningham  MRN:  [id number] Subjective:  41 year old man with a history of schizoaffective disorder who came back into the hospital only shortly after his most recent discharge this time reporting hallucinations and suicidal ideation. On evaluation today patient is in bed staying withdrawn and hardly ever attending groups. He tells me his mood is very sad and feels frightened. He says he's having auditory hallucinations and active wishes that he were dead. He is not acting on any of these and is not aggressive. Patient has been compliant with medication. He admits to me that he really would rather stay in the hospital forever and does not really ever want to be placed in a group home or any other kind of even semi-independent living situation.  Follow-up for Thursday the 30th. Patient seen chart reviewed. This 41 year old man with chronic severe mental health problems and failure to transition to outpatient care is once again complaining of hallucinations. He is withdrawn most of the time. Minimal interaction with other people. Denies active suicidal thoughts. Hardly ever gets around other people though. Tolerating medicines well. His blood sugars are reasonably stable although he once again had an over 300 reading this evening.  Follow-up for Friday the 31st. Patient had ECT bilateral treatment this morning which was well tolerated. Seen this afternoon in treatment team he had no new complaint. Continues to describe himself as very depressed. Continues to report suicidal thoughts and hallucinations. Rarely gets out of his bed and attends almost no groups or therapeutic activities. Blood sugars remained somewhat labile with an elevated sugar this morning.  01/23/17 - patient reports that he continues to have monitoring hallucinations which are  troublesome and distressing. The voices make him feel suicidal. Spent most of the day in bed, saw him walking around in the evening. Denies active plan but does endorse that he has transient thoughts of suicide.  01/24/17 - patient reports that he feels "so-so". Continues to have monitoring hallucinations although not as severe as yesterday. Endorses that he had a good night's rest. I tried to clarify the dosing and duration of clozapine from him but he states that he does not know.  Principal Problem: Schizoaffective disorder, depressive type (HCC) Diagnosis:   Patient Active Problem List   Diagnosis Date Noted  . 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 Schizoaffective disorder, depressive type (HCC) [F25.1] 09/23/2016  . Polysubstance abuse [F19.10] 07/17/2016  . Tardive dyskinesia [G24.01] 07/16/2016  . Tobacco use disorder [F17.200] 07/07/2016  . Dyslipidemia [E78.5] 07/07/2016  . Asthma [J45.909] 07/07/2016  . HTN (hypertension) [I10] 07/06/2016  . Diabetes (HCC) [E11.9] 12/25/2010   Total Time spent with patient: 30 minutes  Past Psychiatric History: Patient has a long-standing history of mental illness but over the last year or so seems to of decompensated more than usual and has had one hospitalization after another. He did show some response to ECT and antipsychotic medication but seems to decompensate immediately whenever he is discharged.  Past Medical History:  Past Medical History:  Diagnosis Date  . Anxiety   . Asthma   . Diabetes mellitus   . High blood pressure   . Sinus complaint    History reviewed. No pertinent surgical history. Family History: History reviewed. No pertinent family history. Family Psychiatric  History: Unknown Social History:  History  Alcohol Use No  History  Drug Use No    Social History   Social History  . Marital status: Single    Spouse name: [name]  . Number of children: [name]  . Years of education: [name]    Social History Main Topics  . Smoking status: Current Every Day Smoker    Packs/day: 0.50    Types: Cigarettes  . Smokeless tobacco: Never Used  . Alcohol use No  . Drug use: No  . Sexual activity: Not Currently   Other Topics Concern  . None   Social History Narrative  . None   Additional Social History:                         Sleep: Fair  Appetite:  Poor  Current Medications: Current Facility-Administered Medications  Medication Dose Route Frequency Provider Last Rate Last Dose  . acetaminophen (TYLENOL) tablet 650 mg  650 mg Oral Q6H PRN [name], [name], MD   650 mg at 01/22/17 1247  . alum & mag hydroxide-simeth (MAALOX/MYLANTA) 200-200-20 MG/5ML suspension 30 mL  30 mL Oral Q4H PRN [name], [name], MD      . citalopram (CELEXA) tablet 20 mg  20 mg Oral Daily [name], [name], MD   20 mg at 01/24/17 0743  . cloZAPine (CLOZARIL) tablet 150 mg  150 mg Oral QHS [name], [name], MD   150 mg at 01/23/17 2146  . insulin aspart (novoLOG) injection 0-5 Units  0-5 Units Subcutaneous QHS [name], [name], MD   4 Units at 01/21/17 2229  . insulin aspart (novoLOG) injection 0-9 Units  0-9 Units Subcutaneous TID WC [name], [name], MD   2 Units at 01/24/17 1128  . insulin aspart (novoLOG) injection 3 Units  3 Units Subcutaneous TID WC [name], [name], MD   3 Units at 01/24/17 1127  . insulin glargine (LANTUS) injection 35 Units  35 Units Subcutaneous QHS [name], [name], MD   35 Units at 01/24/17 0156  . lisinopril (PRINIVIL,ZESTRIL) tablet 20 mg  20 mg Oral Daily [name], [name], MD   20 mg at 01/24/17 0742  . magnesium hydroxide (MILK OF MAGNESIA) suspension 30 mL  30 mL Oral Daily PRN [name], [name], MD      . metFORMIN (GLUCOPHAGE) tablet 1,000 mg  1,000 mg Oral BID WC [name], [name], MD   1,000 mg at 01/24/17 0742  . montelukast (SINGULAIR) tablet 10 mg  10 mg Oral QHS [name], [name], MD   10 mg at 01/23/17 2147  . nicotine (NICODERM CQ - dosed in mg/24 hours) patch  21 mg  21 mg Transdermal Daily [name], [name], MD      . pantoprazole (PROTONIX) EC tablet 40 mg  40 mg Oral Daily [name], [name], MD   40 mg at 01/24/17 0743  . propranolol (INDERAL) tablet 20 mg  20 mg Oral BID [name], [name], MD   20 mg at 01/24/17 2595    Lab Results:  Results for orders placed or performed during the hospital encounter of 01/19/17 (from the past 48 hour(s))  Glucose, capillary     Status: Abnormal   Collection Time: 01/22/17  4:16 PM  Result Value Ref Range   Glucose-Capillary 204 (H) 65 - 99 mg/dL  Glucose, capillary     Status: Abnormal   Collection Time: 01/22/17  8:10 PM  Result Value Ref Range   Glucose-Capillary 129 (H) 65 - 99 mg/dL  Glucose,  capillary     Status: Abnormal   Collection Time: 01/23/17  7:02 AM  Result Value Ref Range   Glucose-Capillary 171 (H) 65 - 99 mg/dL  Glucose, capillary     Status: Abnormal   Collection Time: 01/23/17 11:32 AM  Result Value Ref Range   Glucose-Capillary 126 (H) 65 - 99 mg/dL  Glucose, capillary     Status: Abnormal   Collection Time: 01/23/17  4:17 PM  Result Value Ref Range   Glucose-Capillary 139 (H) 65 - 99 mg/dL  Glucose, capillary     Status: Abnormal   Collection Time: 01/23/17  8:14 PM  Result Value Ref Range   Glucose-Capillary 169 (H) 65 - 99 mg/dL  Glucose, capillary     Status: Abnormal   Collection Time: 01/24/17  7:02 AM  Result Value Ref Range   Glucose-Capillary 295 (H) 65 - 99 mg/dL  Glucose, capillary     Status: Abnormal   Collection Time: 01/24/17 11:26 AM  Result Value Ref Range   Glucose-Capillary 163 (H) 65 - 99 mg/dL    Blood Alcohol level:  Lab Results  Component Value Date   ETH <5 01/16/2017   ETH <5 12/21/2016    Metabolic Disorder Labs: Lab Results  Component Value Date   HGBA1C 7.6 (H) 01/19/2017   MPG 171.42 01/19/2017   MPG 249 11/09/2016   Lab Results  Component Value Date   PROLACTIN 24.5 (H) 09/24/2016   PROLACTIN 3.4 (L) 07/07/2016   Lab Results   Component Value Date   CHOL 122 01/19/2017   TRIG 154 (H) 01/19/2017   HDL 36 (L) 01/19/2017   CHOLHDL 3.4 01/19/2017   VLDL 31 01/19/2017   LDLCALC 55 01/19/2017   LDLCALC 59 12/28/2016    Physical Findings: AIMS: Facial and Oral Movements Muscles of Facial Expression: None, normal Lips and Perioral Area: None, normal Jaw: None, normal Tongue: None, normal,Extremity Movements Upper (arms, wrists, hands, fingers): None, normal Lower (legs, knees, ankles, toes): None, normal, Trunk Movements Neck, shoulders, hips: None, normal, Overall Severity Severity of abnormal movements (highest score from questions above): None, normal Incapacitation due to abnormal movements: None, normal Patient's awareness of abnormal movements (rate only patient's report): No Awareness, Dental Status Current problems with teeth and/or dentures?: No Does patient usually wear dentures?: No  CIWA:    COWS:  COWS Total Score: 1  Musculoskeletal: Strength & Muscle Tone: decreased Gait & Station: normal Patient leans: [name]  Psychiatric Specialty Exam: Physical Exam  Nursing note and vitals reviewed. Constitutional: He appears well-developed and well-nourished.  HENT:  Head: Normocephalic and atraumatic.  Eyes: Pupils are equal, round, and reactive to light. Conjunctivae are normal.  Neck: Normal range of motion.  Cardiovascular: Regular rhythm and normal heart sounds.   Respiratory: Effort normal and breath sounds normal. No respiratory distress.  GI: Soft.  Musculoskeletal: Normal range of motion.  Neurological: He is alert.  Skin: Skin is warm and dry.  Psychiatric: His affect is blunt. His speech is delayed. He is slowed and withdrawn. Thought content is paranoid. Cognition and memory are impaired. He expresses impulsivity. He expresses suicidal ideation. He expresses no homicidal ideation.    Review of Systems  Constitutional: Negative.   HENT: Negative.   Eyes: Negative.   Respiratory:  Negative.   Cardiovascular: Negative.   Gastrointestinal: Negative.   Musculoskeletal: Negative.   Skin: Negative.   Neurological: Negative.   Psychiatric/Behavioral: Positive for depression, hallucinations, memory loss and suicidal ideas. Negative for substance abuse. The  patient is nervous/anxious and has insomnia.     Blood pressure 117/82, pulse 97, temperature 98.3 F (36.8 C), temperature source Oral, resp. rate 18, height 6' (1.829 m), weight 173 lb (78.5 kg), SpO2 99 %.Body mass index is 23.46 kg/m.  General Appearance: Disheveled  Eye Contact:  Fair  Speech:  Slow  Volume:  Decreased  Mood:  Depressed and Dysphoric  Affect:  Constricted and Depressed  Thought Process:  Disorganized  Orientation:  Full (Time, Place, and Person)  Thought Content:  Illogical, Hallucinations: Auditory and Paranoid Ideation  Suicidal Thoughts:  Yes.  without intent/plan  Homicidal Thoughts:  No  Memory:  Immediate;   Good Recent;   Fair Remote;   Fair  Judgement:  Impaired  Insight:  Shallow  Psychomotor Activity:  Decreased  Concentration:  Concentration: Poor  Recall:  Poor  Fund of Knowledge:  Poor  Language:  Fair  Akathisia:  No  Handed:  Right  AIMS (if indicated):     Assets:  Desire for Improvement  ADL's:  Impaired  Cognition:  Impaired,  Mild  Sleep:  Number of Hours: 39.34   41 year old gentleman with a chronic history of schizoaffective disorder, multiple hospitalizations, ECT and Clozaril treatment. As a consistent history of frequent decompensation when he leaves the hospital. Has been getting maintenance and index ECT's prolonged period of time. On Clozaril 150 mg his previous level was in the 400s. Dr. Toni Amend had started him on olanzapine 10 mg in addition to the Clozaril. Given that his clozapine levels a disproportionate to the dose, it makes sense to add another antipsychotic to his regimen given the degree of treatment refractoriness when he has. This morning he endorses  auditory hallucinations and transient suicidal ideations which has been the case yesterday too.   #Schizoaffective disorder, SI - Continue ECT - Next one on 01/27/17 - Continue clozapine 150 mg QHS, aim to optimize dose of antipsychotic monotherapy. Level pending and will hold at 150 mg.  - Continue Olanzapine 10 mg - Continue celexa 20 mg daily  #Smoking cessation Continue nicotine patch 21 mg/24 hours Continue propranolol 20 mg BID  #DM Continue metformin 1000 mg BIDCC Insulin sliding scale  #GERD Continue protonix EC 40 mg daily.    Lipid Panel     Component Value Date/Time   CHOL 122 01/19/2017 0637   TRIG 154 (H) 01/19/2017 0637   HDL 36 (L) 01/19/2017 0637   CHOLHDL 3.4 01/19/2017 0637   VLDL 31 01/19/2017 0637   LDLCALC 55 01/19/2017 4270    Aundria Rud, MD 01/24/2017, 12:48 PM

## 2017-01-24 NOTE — Plan of Care (Signed)
Problem: Education: Goal: Ability to make informed decisions regarding treatment will improve Outcome: Not Progressing Patient does not demonstrate the ability to make informed decisions regarding treatment.  Problem: Medication: Goal: Compliance with prescribed medication regimen will improve Outcome: Progressing Patient is compliant with prescribed medication regimen.  Problem: Self-Concept: Goal: Ability to verbalize positive feelings about self will improve Outcome: Not Progressing Patient can not verbalize positive feelings about self.  Problem: Activity: Goal: Interest or engagement in leisure activities will improve Outcome: Not Progressing Patient does not show interest in leisure activities.

## 2017-01-24 NOTE — [facility] Group Notes (Signed)
[facility] Group Notes:  (Nursing/MHT/Case Management/Adjunct)  Date:  01/24/2017  Time:  9:43 PM  Type of Therapy:  Evening Wrap-up Group  Participation Level:  Active  Participation Quality:  Appropriate  Affect:  Flat  Cognitive:  Appropriate  Insight:  Improving  Engagement in Group:  Developing/Improving  Modes of Intervention:  Discussion  Summary of Progress/Problems:  Tony Cunningham 01/24/2017, 9:43 PM

## 2017-01-24 NOTE — Plan of Care (Signed)
Problem: Coping: Goal: Ability to cope will improve Outcome: Progressing Pt will exhibit at least 1 coping skill this shift.

## 2017-01-25 LAB — GLUCOSE, CAPILLARY
GLUCOSE-CAPILLARY: 167 mg/dL — AB (ref 65–99)
Glucose-Capillary: 204 mg/dL — ABNORMAL HIGH (ref 65–99)
Glucose-Capillary: 75 mg/dL (ref 65–99)
Glucose-Capillary: 145 mg/dL — ABNORMAL HIGH (ref 65–99)

## 2017-01-25 MED ORDER — CITALOPRAM HYDROBROMIDE 20 MG PO TABS
40.0000 mg | ORAL_TABLET | Freq: Every day | ORAL | Status: DC
Start: 2017-01-26 — End: 2017-01-26
  Administered 2017-01-26: 40 mg via ORAL
  Filled 2017-01-25: qty 2

## 2017-01-25 NOTE — Progress Notes (Signed)
BDaily contact with patient to assess and evaluate symptoms and progress in treatment, Medication management and Plan [facility] MD Progress Note  01/25/2017 1:59 PM Tony Cunningham  MRN:  [id number] Subjective:  41 year old man with a history of schizoaffective disorder who came back into the hospital only shortly after his most recent discharge this time reporting hallucinations and suicidal ideation. On evaluation today patient is in bed staying withdrawn and hardly ever attending groups. He tells me his mood is very sad and feels frightened. He says he's having auditory hallucinations and active wishes that he were dead. He is not acting on any of these and is not aggressive. Patient has been compliant with medication. He admits to me that he really would rather stay in the hospital forever and does not really ever want to be placed in a group home or any other kind of even semi-independent living situation.  Follow-up for Thursday the 30th. Patient seen chart reviewed. This 41 year old man with chronic severe mental health problems and failure to transition to outpatient care is once again complaining of hallucinations. He is withdrawn most of the time. Minimal interaction with other people. Denies active suicidal thoughts. Hardly ever gets around other people though. Tolerating medicines well. His blood sugars are reasonably stable although he once again had an over 300 reading this evening.  Follow-up for Friday the 31st. Patient had ECT bilateral treatment this morning which was well tolerated. Seen this afternoon in treatment team he had no new complaint. Continues to describe himself as very depressed. Continues to report suicidal thoughts and hallucinations. Rarely gets out of his bed and attends almost no groups or therapeutic activities. Blood sugars remained somewhat labile with an elevated sugar this morning.  01/23/17 - patient reports that he continues to have monitoring hallucinations which are  troublesome and distressing. The voices make him feel suicidal. Spent most of the day in bed, saw him walking around in the evening. Denies active plan but does endorse that he has transient thoughts of suicide.  01/24/17 - patient reports that he feels "so-so". Continues to have monitoring hallucinations although not as severe as yesterday. Endorses that he had a good night's rest. I tried to clarify the dosing and duration of clozapine from him but he states that he does not know.  Follow-up for September 3. Patient still lying in bed all day. He tells me that he still hears voices but they are "less". He can't really quantify that. Denies active suicidal thoughts. Admits that he worries constantly about how he will function outside of the hospital. Chart reviewed. I note that the last clozapine level that came back was in the middle of last month and was well into the therapeutic range.  Principal Problem: Schizoaffective disorder, depressive type (HCC) Diagnosis:   Patient Active Problem List   Diagnosis Date Noted  . 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 Schizoaffective disorder, depressive type (HCC) [F25.1] 09/23/2016  . Polysubstance abuse [F19.10] 07/17/2016  . Tardive dyskinesia [G24.01] 07/16/2016  . Tobacco use disorder [F17.200] 07/07/2016  . Dyslipidemia [E78.5] 07/07/2016  . Asthma [J45.909] 07/07/2016  . HTN (hypertension) [I10] 07/06/2016  . Diabetes (HCC) [E11.9] 12/25/2010   Total Time spent with patient: 30 minutes  Past Psychiatric History: Patient has a long-standing history of mental illness but over the last year or so seems to of decompensated more than usual and has had one hospitalization after another. He did show some response to ECT and antipsychotic medication but seems to decompensate  immediately whenever he is discharged.  Past Medical History:  Past Medical History:  Diagnosis Date  . Anxiety   . Asthma   . Diabetes mellitus   .  High blood pressure   . Sinus complaint    History reviewed. No pertinent surgical history. Family History: History reviewed. No pertinent family history. Family Psychiatric  History: Unknown Social History:  History  Alcohol Use No     History  Drug Use No    Social History   Social History  . Marital status: Single    Spouse name: [name]  . Number of children: [name]  . Years of education: [name]   Social History Main Topics  . Smoking status: Current Every Day Smoker    Packs/day: 0.50    Types: Cigarettes  . Smokeless tobacco: Never Used  . Alcohol use No  . Drug use: No  . Sexual activity: Not Currently   Other Topics Concern  . None   Social History Narrative  . None   Additional Social History:                         Sleep: Fair  Appetite:  Poor  Current Medications: Current Facility-Administered Medications  Medication Dose Route Frequency Provider Last Rate Last Dose  . acetaminophen (TYLENOL) tablet 650 mg  650 mg Oral Q6H PRN Lauramae Kneisley, [name], MD   650 mg at 01/22/17 1247  . alum & mag hydroxide-simeth (MAALOX/MYLANTA) 200-200-20 MG/5ML suspension 30 mL  30 mL Oral Q4H PRN Leib Elahi, [name], MD      . Melene Muller ON 01/26/2017] citalopram (CELEXA) tablet 40 mg  40 mg Oral Daily Cliffard Hair T, MD      . cloZAPine (CLOZARIL) tablet 150 mg  150 mg Oral QHS Emmagene Ortner, [name], MD   150 mg at 01/24/17 2104  . insulin aspart (novoLOG) injection 0-5 Units  0-5 Units Subcutaneous QHS Galvin Aversa, [name], MD   4 Units at 01/21/17 2229  . insulin aspart (novoLOG) injection 0-9 Units  0-9 Units Subcutaneous TID WC Ramar Nobrega, [name], MD   3 Units at 01/25/17 0900  . insulin aspart (novoLOG) injection 3 Units  3 Units Subcutaneous TID WC Aubreanna Percle, [name], MD   3 Units at 01/25/17 0900  . insulin glargine (LANTUS) injection 35 Units  35 Units Subcutaneous QHS Kelisha Dall, [name], MD   35 Units at 01/24/17 2128  . lisinopril (PRINIVIL,ZESTRIL) tablet 20 mg  20 mg Oral Daily Nanci Lakatos, [name], MD   20 mg at 01/25/17 0857  . magnesium hydroxide (MILK OF MAGNESIA) suspension 30 mL  30 mL Oral Daily PRN Osias Resnick T, MD      . metFORMIN (GLUCOPHAGE) tablet 1,000 mg  1,000 mg Oral BID WC Mekayla Soman, [name], MD   1,000 mg at 01/25/17 0858  . montelukast (SINGULAIR) tablet 10 mg  10 mg Oral QHS Jersey Ravenscroft, [name], MD   10 mg at 01/24/17 2104  . nicotine (NICODERM CQ - dosed in mg/24 hours) patch 21 mg  21 mg Transdermal Daily [name], [name], MD      . pantoprazole (PROTONIX) EC tablet 40 mg  40 mg Oral Daily Taiwo Fish, [name], MD   40 mg at 01/25/17 0857  . propranolol (INDERAL) tablet 20 mg  20 mg Oral BID Macarius Ruark, [name], MD   20 mg at 01/25/17 1443    Lab Results:  Results for orders placed or performed during the  hospital encounter of 01/19/17 (from the past 48 hour(s))  Glucose, capillary     Status: Abnormal   Collection Time: 01/23/17  4:17 PM  Result Value Ref Range   Glucose-Capillary 139 (H) 65 - 99 mg/dL  Glucose, capillary     Status: Abnormal   Collection Time: 01/23/17  8:14 PM  Result Value Ref Range   Glucose-Capillary 169 (H) 65 - 99 mg/dL  Glucose, capillary     Status: Abnormal   Collection Time: 01/24/17  7:02 AM  Result Value Ref Range   Glucose-Capillary 295 (H) 65 - 99 mg/dL  Glucose, capillary     Status: Abnormal   Collection Time: 01/24/17 11:26 AM  Result Value Ref Range   Glucose-Capillary 163 (H) 65 - 99 mg/dL  Glucose, capillary     Status: None   Collection Time: 01/24/17  4:28 PM  Result Value Ref Range   Glucose-Capillary 99 65 - 99 mg/dL  Glucose, capillary     Status: Abnormal   Collection Time: 01/24/17  8:11 PM  Result Value Ref Range   Glucose-Capillary 144 (H) 65 - 99 mg/dL  Glucose, capillary     Status: Abnormal   Collection Time: 01/25/17  7:07 AM  Result Value Ref Range   Glucose-Capillary 204 (H) 65 - 99 mg/dL  Glucose, capillary     Status: None   Collection Time: 01/25/17 11:18 AM  Result Value Ref Range    Glucose-Capillary 75 65 - 99 mg/dL    Blood Alcohol level:  Lab Results  Component Value Date   ETH <5 01/16/2017   ETH <5 12/21/2016    Metabolic Disorder Labs: Lab Results  Component Value Date   HGBA1C 7.6 (H) 01/19/2017   MPG 171.42 01/19/2017   MPG 249 11/09/2016   Lab Results  Component Value Date   PROLACTIN 24.5 (H) 09/24/2016   PROLACTIN 3.4 (L) 07/07/2016   Lab Results  Component Value Date   CHOL 122 01/19/2017   TRIG 154 (H) 01/19/2017   HDL 36 (L) 01/19/2017   CHOLHDL 3.4 01/19/2017   VLDL 31 01/19/2017   LDLCALC 55 01/19/2017   LDLCALC 59 12/28/2016    Physical Findings: AIMS: Facial and Oral Movements Muscles of Facial Expression: None, normal Lips and Perioral Area: None, normal Jaw: None, normal Tongue: None, normal,Extremity Movements Upper (arms, wrists, hands, fingers): Mild Lower (legs, knees, ankles, toes): None, normal, Trunk Movements Neck, shoulders, hips: None, normal, Overall Severity Severity of abnormal movements (highest score from questions above): Minimal Incapacitation due to abnormal movements: None, normal Patient's awareness of abnormal movements (rate only patient's report): No Awareness, Dental Status Current problems with teeth and/or dentures?: No Does patient usually wear dentures?: No  CIWA:    COWS:  COWS Total Score: 1  Musculoskeletal: Strength & Muscle Tone: decreased Gait & Station: normal Patient leans: [name]  Psychiatric Specialty Exam: Physical Exam  Nursing note and vitals reviewed. Constitutional: He appears well-developed and well-nourished.  HENT:  Head: Normocephalic and atraumatic.  Eyes: Pupils are equal, round, and reactive to light. Conjunctivae are normal.  Neck: Normal range of motion.  Cardiovascular: Regular rhythm and normal heart sounds.   Respiratory: Effort normal and breath sounds normal. No respiratory distress.  GI: Soft.  Musculoskeletal: Normal range of motion.  Neurological: He is  alert.  Skin: Skin is warm and dry.  Psychiatric: His affect is blunt. His speech is delayed. He is slowed and withdrawn. Thought content is paranoid. Cognition and memory are impaired. He  expresses impulsivity. He expresses suicidal ideation. He expresses no homicidal ideation.    Review of Systems  Constitutional: Negative.   HENT: Negative.   Eyes: Negative.   Respiratory: Negative.   Cardiovascular: Negative.   Gastrointestinal: Negative.   Musculoskeletal: Negative.   Skin: Negative.   Neurological: Negative.   Psychiatric/Behavioral: Positive for depression, hallucinations, memory loss and suicidal ideas. Negative for substance abuse. The patient is nervous/anxious and has insomnia.     Blood pressure 120/80, pulse 92, temperature 98.7 F (37.1 C), temperature source Oral, resp. rate 18, height 6' (1.829 m), weight 173 lb (78.5 kg), SpO2 99 %.Body mass index is 23.46 kg/m.  General Appearance: Disheveled  Eye Contact:  Fair  Speech:  Slow  Volume:  Decreased  Mood:  Depressed and Dysphoric  Affect:  Constricted and Depressed  Thought Process:  Disorganized  Orientation:  Full (Time, Place, and Person)  Thought Content:  Illogical, Hallucinations: Auditory and Paranoid Ideation  Suicidal Thoughts:  Yes.  without intent/plan  Homicidal Thoughts:  No  Memory:  Immediate;   Good Recent;   Fair Remote;   Fair  Judgement:  Impaired  Insight:  Shallow  Psychomotor Activity:  Decreased  Concentration:  Concentration: Poor  Recall:  Poor  Fund of Knowledge:  Poor  Language:  Fair  Akathisia:  No  Handed:  Right  AIMS (if indicated):     Assets:  Desire for Improvement  ADL's:  Impaired  Cognition:  Impaired,  Mild  Sleep:  Number of Hours: 6.45   Patient was schizoaffective disorder getting ECT and also medication stabilization. No real reason I don't think to increase the clozapine. I see that Zyprexa was added in the last day or so but I doubt that is going to be  helpful and may make his blood sugar problem worse. We don't need anything to make him more sedated. I'm going to hold that any increase the dose of Celexa to 40 mg a day. As always encouraged him to get up out of bed and be more active. #Schizoaffective disorder, SI - Continue ECT - Next one on 01/27/17 - Continue clozapine 150 mg QHS, aim to optimize dose of antipsychotic monotherapy. Level pending and will hold at 150 mg.  -  - Continue celexa 20 mg daily  #Smoking cessation Continue nicotine patch 21 mg/24 hours Continue propranolol 20 mg BID  #DM Continue metformin 1000 mg BIDCC Insulin sliding scale  #GERD Continue protonix EC 40 mg daily.    Lipid Panel     Component Value Date/Time   CHOL 122 01/19/2017 0637   TRIG 154 (H) 01/19/2017 0637   HDL 36 (L) 01/19/2017 0637   CHOLHDL 3.4 01/19/2017 0637   VLDL 31 01/19/2017 0637   LDLCALC 55 01/19/2017 1443    Mordecai Rasmussen, MD 01/25/2017, 1:59 PM

## 2017-01-25 NOTE — [facility] Group Notes (Signed)
[facility] LCSW Group Therapy Note  Date/Time: 01/25/2017  Type of Therapy and Topic:  Group Therapy:  Overcoming Obstacles  Participation Level:  Did Not Attend  Latunya Kissick, LCSW   

## 2017-01-25 NOTE — Progress Notes (Signed)
Pt A & O X3. Visible in milieu at intervals during shift, other times isolative to his room. Denies SI, HI, AVH and pain "not now". Did not attend group as encouraged. Reports he's sleeping well with fair appetite.  A: Medications administered as prescribed with verbal education and effects monitored. Support and encouragement offered to pt this shift. Q 15 minutes safety checks maintained.  R: Pt compliant with medications. Continues to demand sleeping medicines despite education on medication schedule times. Remains safe on unit.

## 2017-01-25 NOTE — Plan of Care (Signed)
Problem: Coping: Goal: Ability to cope will improve Outcome: Progressing Patient is participating in activities with peers and attends groups   Problem: Medication: Goal: Compliance with prescribed medication regimen will improve Outcome: Progressing Patient is accepting his medications and cooperating with care of ADLs.  Problem: Safety: Goal: Ability to disclose and discuss suicidal ideas will improve Outcome: Progressing Patient is safe without harm to self and others at this time

## 2017-01-25 NOTE — [facility] Group Notes (Signed)
[facility] Group Notes:  (Nursing/MHT/Case Management/Adjunct)  Date:  01/25/2017  Time:  10:16 PM  Type of Therapy:  Group Therapy  Participation Level:  Active  Participation Quality:  Appropriate  Affect:  Appropriate  Cognitive:  Appropriate  Insight:  Appropriate  Engagement in Group:  Engaged  Modes of Intervention:  Discussion  Summary of Progress/Problems:  Tony Cunningham 01/25/2017, 10:16 PM

## 2017-01-26 ENCOUNTER — Other Ambulatory Visit: Payer: Self-pay | Admitting: Psychiatry

## 2017-01-26 LAB — GLUCOSE, CAPILLARY
GLUCOSE-CAPILLARY: 215 mg/dL — AB (ref 65–99)
GLUCOSE-CAPILLARY: 183 mg/dL — AB (ref 65–99)
Glucose-Capillary: 118 mg/dL — ABNORMAL HIGH (ref 65–99)
Glucose-Capillary: 183 mg/dL — ABNORMAL HIGH (ref 65–99)

## 2017-01-26 MED ORDER — FLUOXETINE HCL 20 MG PO CAPS
20.0000 mg | ORAL_CAPSULE | Freq: Every day | ORAL | Status: DC
  Administered 2017-01-26 – 2017-01-27 (×2): 20 mg via ORAL
  Filled 2017-01-26 (×2): qty 1

## 2017-01-26 NOTE — Progress Notes (Signed)
Patient denies any thoughts of SI/HI at this time, safety and education is provided,monitored every 15 munites for safety

## 2017-01-26 NOTE — [facility] Group Notes (Signed)
[facility] Group Notes:  (Nursing/MHT/Case Management/Adjunct)  Date:  01/26/2017  Time:  10:39 AM  Type of Therapy:  Psychoeducational Skills  Participation Level:  Did Not Attend   Summary of Progress/Problems:  Tony Cunningham 01/26/2017, 10:39 AM

## 2017-01-26 NOTE — Progress Notes (Signed)
BDaily contact with patient to assess and evaluate symptoms and progress in treatment, Medication management and Plan [facility] MD Progress Note  01/26/2017 2:38 PM Tony Cunningham  MRN:  [id number] Subjective:  41 year old man with a history of schizoaffective disorder who came back into the hospital only shortly after his most recent discharge this time reporting hallucinations and suicidal ideation. On evaluation today patient is in bed staying withdrawn and hardly ever attending groups. He tells me his mood is very sad and feels frightened. He says he's having auditory hallucinations and active wishes that he were dead. He is not acting on any of these and is not aggressive. Patient has been compliant with medication. He admits to me that he really would rather stay in the hospital forever and does not really ever want to be placed in a group home or any other kind of even semi-independent living situation.  Follow-up for Thursday the 30th. Patient seen chart reviewed. This 41 year old man with chronic severe mental health problems and failure to transition to outpatient care is once again complaining of hallucinations. He is withdrawn most of the time. Minimal interaction with other people. Denies active suicidal thoughts. Hardly ever gets around other people though. Tolerating medicines well. His blood sugars are reasonably stable although he once again had an over 300 reading this evening.  Follow-up for Friday the 31st. Patient had ECT bilateral treatment this morning which was well tolerated. Seen this afternoon in treatment team he had no new complaint. Continues to describe himself as very depressed. Continues to report suicidal thoughts and hallucinations. Rarely gets out of his bed and attends almost no groups or therapeutic activities. Blood sugars remained somewhat labile with an elevated sugar this morning.  01/23/17 - patient reports that he continues to have monitoring hallucinations which are  troublesome and distressing. The voices make him feel suicidal. Spent most of the day in bed, saw him walking around in the evening. Denies active plan but does endorse that he has transient thoughts of suicide.  01/24/17 - patient reports that he feels "so-so". Continues to have monitoring hallucinations although not as severe as yesterday. Endorses that he had a good night's rest. I tried to clarify the dosing and duration of clozapine from him but he states that he does not know.  Follow-up for September 3. Patient still lying in bed all day. He tells me that he still hears voices but they are "less". He can't really quantify that. Denies active suicidal thoughts. Admits that he worries constantly about how he will function outside of the hospital. Chart reviewed. I note that the last clozapine level that came back was in the middle of last month and was well into the therapeutic range.  Follow-up Tuesday, September 4. Patient seen chart reviewed. Patient remains isolated to his room almost all the time. When I came to see him in the mid afternoon he was awake but lying face of on his bed staring at the ceiling. He tells me he is still having hallucinations although qualifies that they are "better" than they were before. He also tells me he is having racing thoughts including intrusive thoughts about himself being run over by a car. His affect is flat blunted and withdrawn. Continues to have vague suicidal thoughts without immediate plan. Continues to voice a lot of fear about being outside the hospital. No new physical complaints. Blood sugars still elevated but they could be worse.  Principal Problem: Schizoaffective disorder, depressive type (HCC) Diagnosis:  Patient Active Problem List   Diagnosis Date Noted  . 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 Schizoaffective disorder, depressive type (HCC) [F25.1] 09/23/2016  . Polysubstance abuse [F19.10] 07/17/2016  . 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 Tobacco use disorder [F17.200] 07/07/2016  . Dyslipidemia [E78.5] 07/07/2016  . Asthma [J45.909] 07/07/2016  . HTN (hypertension) [I10] 07/06/2016  . Diabetes (HCC) [E11.9] 12/25/2010   Total Time spent with patient: 30 minutes  Past Psychiatric History: Patient has a long-standing history of mental illness but over the last year or so seems to of decompensated more than usual and has had one hospitalization after another. He did show some response to ECT and antipsychotic medication but seems to decompensate immediately whenever he is discharged.  Past Medical History:  Past Medical History:  Diagnosis Date  . Anxiety   . Asthma   . Diabetes mellitus   . High blood pressure   . Sinus complaint    History reviewed. No pertinent surgical history. Family History: History reviewed. No pertinent family history. Family Psychiatric  History: Unknown Social History:  History  Alcohol Use No     History  Drug Use No    Social History   Social History  . Marital status: Single    Spouse name: [name]  . Number of children: [name]  . Years of education: [name]   Social History Main Topics  . Smoking status: Current Every Day Smoker    Packs/day: 0.50    Types: Cigarettes  . Smokeless tobacco: Never Used  . Alcohol use No  . Drug use: No  . Sexual activity: Not Currently   Other Topics Concern  . None   Social History Narrative  . None   Additional Social History:                         Sleep: Fair  Appetite:  Poor  Current Medications: Current Facility-Administered Medications  Medication Dose Route Frequency Provider Last Rate Last Dose  . acetaminophen (TYLENOL) tablet 650 mg  650 mg Oral Q6H PRN Charmaine Placido, [name], MD   650 mg at 01/22/17 1247  . alum & mag hydroxide-simeth (MAALOX/MYLANTA) 200-200-20 MG/5ML suspension 30 mL  30 mL Oral Q4H PRN Lamesha Tibbits T, MD      . cloZAPine (CLOZARIL) tablet 150 mg  150 mg Oral QHS  Chaska Hagger, [name], MD   150 mg at 01/25/17 2144  . FLUoxetine (PROZAC) capsule 20 mg  20 mg Oral Daily Kallen Mccrystal T, MD      . insulin aspart (novoLOG) injection 0-5 Units  0-5 Units Subcutaneous QHS Vanita Cannell, [name], MD   4 Units at 01/21/17 2229  . insulin aspart (novoLOG) injection 0-9 Units  0-9 Units Subcutaneous TID WC Silas Sedam, [name], MD   5 Units at 01/26/17 1138  . insulin aspart (novoLOG) injection 3 Units  3 Units Subcutaneous TID WC Jennavieve Arrick, [name], MD   3 Units at 01/26/17 1138  . insulin glargine (LANTUS) injection 35 Units  35 Units Subcutaneous QHS Baron Parmelee, [name], MD   35 Units at 01/25/17 2144  . lisinopril (PRINIVIL,ZESTRIL) tablet 20 mg  20 mg Oral Daily Kyre Jeffries, [name], MD   20 mg at 01/26/17 0756  . magnesium hydroxide (MILK OF MAGNESIA) suspension 30 mL  30 mL Oral Daily PRN Lochlann Mastrangelo T, MD      . metFORMIN (GLUCOPHAGE) tablet 1,000 mg  1,000 mg Oral BID WC Jara Feider  T, MD   1,000 mg at 01/26/17 0756  . montelukast (SINGULAIR) tablet 10 mg  10 mg Oral QHS Jeanine Caven, [name], MD   10 mg at 01/25/17 2143  . nicotine (NICODERM CQ - dosed in mg/24 hours) patch 21 mg  21 mg Transdermal Daily [name], [name], MD      . pantoprazole (PROTONIX) EC tablet 40 mg  40 mg Oral Daily Tashai Catino, [name], MD   40 mg at 01/26/17 0757  . propranolol (INDERAL) tablet 20 mg  20 mg Oral BID Wisdom Rickey, [name], MD   20 mg at 01/26/17 0756    Lab Results:  Results for orders placed or performed during the hospital encounter of 01/19/17 (from the past 48 hour(s))  Glucose, capillary     Status: None   Collection Time: 01/24/17  4:28 PM  Result Value Ref Range   Glucose-Capillary 99 65 - 99 mg/dL  Glucose, capillary     Status: Abnormal   Collection Time: 01/24/17  8:11 PM  Result Value Ref Range   Glucose-Capillary 144 (H) 65 - 99 mg/dL  Glucose, capillary     Status: Abnormal   Collection Time: 01/25/17  7:07 AM  Result Value Ref Range   Glucose-Capillary 204 (H) 65 - 99 mg/dL   Glucose, capillary     Status: None   Collection Time: 01/25/17 11:18 AM  Result Value Ref Range   Glucose-Capillary 75 65 - 99 mg/dL  Glucose, capillary     Status: Abnormal   Collection Time: 01/25/17  4:14 PM  Result Value Ref Range   Glucose-Capillary 167 (H) 65 - 99 mg/dL  Glucose, capillary     Status: Abnormal   Collection Time: 01/25/17  8:33 PM  Result Value Ref Range   Glucose-Capillary 145 (H) 65 - 99 mg/dL  Glucose, capillary     Status: Abnormal   Collection Time: 01/26/17  7:07 AM  Result Value Ref Range   Glucose-Capillary 215 (H) 65 - 99 mg/dL  Glucose, capillary     Status: Abnormal   Collection Time: 01/26/17 11:38 AM  Result Value Ref Range   Glucose-Capillary 183 (H) 65 - 99 mg/dL    Blood Alcohol level:  Lab Results  Component Value Date   ETH <5 01/16/2017   ETH <5 12/21/2016    Metabolic Disorder Labs: Lab Results  Component Value Date   HGBA1C 7.6 (H) 01/19/2017   MPG 171.42 01/19/2017   MPG 249 11/09/2016   Lab Results  Component Value Date   PROLACTIN 24.5 (H) 09/24/2016   PROLACTIN 3.4 (L) 07/07/2016   Lab Results  Component Value Date   CHOL 122 01/19/2017   TRIG 154 (H) 01/19/2017   HDL 36 (L) 01/19/2017   CHOLHDL 3.4 01/19/2017   VLDL 31 01/19/2017   LDLCALC 55 01/19/2017   LDLCALC 59 12/28/2016    Physical Findings: AIMS: Facial and Oral Movements Muscles of Facial Expression: None, normal Lips and Perioral Area: None, normal Jaw: None, normal Tongue: None, normal,Extremity Movements Upper (arms, wrists, hands, fingers): Mild Lower (legs, knees, ankles, toes): None, normal, Trunk Movements Neck, shoulders, hips: None, normal, Overall Severity Severity of abnormal movements (highest score from questions above): Minimal Incapacitation due to abnormal movements: None, normal Patient's awareness of abnormal movements (rate only patient's report): No Awareness, Dental Status Current problems with teeth and/or dentures?:  No Does patient usually wear dentures?: No  CIWA:    COWS:  COWS Total Score: 1  Musculoskeletal: Strength & Muscle  Tone: decreased Gait & Station: normal Patient leans: [name]  Psychiatric Specialty Exam: Physical Exam  Nursing note and vitals reviewed. Constitutional: He appears well-developed and well-nourished.  HENT:  Head: Normocephalic and atraumatic.  Eyes: Pupils are equal, round, and reactive to light. Conjunctivae are normal.  Neck: Normal range of motion.  Cardiovascular: Regular rhythm and normal heart sounds.   Respiratory: Effort normal and breath sounds normal. No respiratory distress.  GI: Soft.  Musculoskeletal: Normal range of motion.  Neurological: He is alert.  Skin: Skin is warm and dry.  Psychiatric: His affect is blunt. His speech is delayed. He is slowed and withdrawn. Thought content is paranoid. Cognition and memory are impaired. He expresses impulsivity. He expresses suicidal ideation. He expresses no homicidal ideation.    Review of Systems  Constitutional: Negative.   HENT: Negative.   Eyes: Negative.   Respiratory: Negative.   Cardiovascular: Negative.   Gastrointestinal: Negative.   Musculoskeletal: Negative.   Skin: Negative.   Neurological: Negative.   Psychiatric/Behavioral: Positive for depression, hallucinations, memory loss and suicidal ideas. Negative for substance abuse. The patient is nervous/anxious and has insomnia.     Blood pressure 112/80, pulse 95, temperature 98.4 F (36.9 C), temperature source Oral, resp. rate 18, height 6' (1.829 m), weight 173 lb (78.5 kg), SpO2 100 %.Body mass index is 23.46 kg/m.  General Appearance: Disheveled  Eye Contact:  Fair  Speech:  Slow  Volume:  Decreased  Mood:  Depressed and Dysphoric  Affect:  Constricted and Depressed  Thought Process:  Disorganized  Orientation:  Full (Time, Place, and Person)  Thought Content:  Illogical, Hallucinations: Auditory and Paranoid Ideation  Suicidal  Thoughts:  Yes.  without intent/plan  Homicidal Thoughts:  No  Memory:  Immediate;   Good Recent;   Fair Remote;   Fair  Judgement:  Impaired  Insight:  Shallow  Psychomotor Activity:  Decreased  Concentration:  Concentration: Poor  Recall:  Poor  Fund of Knowledge:  Poor  Language:  Fair  Akathisia:  No  Handed:  Right  AIMS (if indicated):     Assets:  Desire for Improvement  ADL's:  Impaired  Cognition:  Impaired,  Mild  Sleep:  Number of Hours: 6.45   Patient was schizoaffective disorder getting ECT and also medication stabilization. No real reason I don't think to increase the clozapine. I see that Zyprexa was added in the last day or so but I doubt that is going to be helpful and may make his blood sugar problem worse. We don't need anything to make him more sedated. I'm going to hold that any increase the dose of Celexa to 40 mg a day. As always encouraged him to get up out of bed and be more active. #Schizoaffective disorder, SI - Continue ECT - Next one on 01/27/17 - Continue clozapine 150 mg QHS, aim to optimize dose of antipsychotic monotherapy. Level pending and will hold at 150 mg.  -  Continue the clozapine. I am going to change the Celexa to Prozac in the hopes that we could potentially increase that to a higher dose for what really seems like a lot of obsessive anxiety along with his depression. Continue ECT with next treatment being scheduled for tomorrow. Tried to encourage the patient and explain why he needs to get up out of bed and attend groups. -   #Smoking cessation Continue nicotine patch 21 mg/24 hours Continue propranolol 20 mg BID  #DM Continue metformin 1000 mg BIDCC Insulin sliding scale  #  GERD Continue protonix EC 40 mg daily.    Lipid Panel     Component Value Date/Time   CHOL 122 01/19/2017 0637   TRIG 154 (H) 01/19/2017 0637   HDL 36 (L) 01/19/2017 0637   CHOLHDL 3.4 01/19/2017 0637   VLDL 31 01/19/2017 0637   LDLCALC 55 01/19/2017 1610     Mordecai Rasmussen, MD 01/26/2017, 2:38 PM

## 2017-01-26 NOTE — Progress Notes (Signed)
Received Tony Cunningham this am in his room after repot. He was OOB for his medications and meals. He stated he went to the first group this am and was encouraged to attend another group today. He completed his self inventory worksheet  and it was reviewed with him. He rated anxiety, depression and feeling hopelessness high although he stated it has decreased since his admission. He stated his sleep his fair and energy level remains low. He remained somewhat isolative to his room.

## 2017-01-26 NOTE — Plan of Care (Signed)
Problem: Activity: Goal: Interest or engagement in leisure activities will improve Outcome: Progressing Kiran attended a group therapy session this am and was receptive when this Clinical research associate encouraged him to attend more groups.   Problem: Education: Goal: Knowledge of the prescribed therapeutic regimen will improve Zakari started out this am being isolative to his room, as the day progress and we reviewed his self assessment his affect brighten along with his verbal conversation.

## 2017-01-26 NOTE — [facility] Group Notes (Signed)
[facility] Group Notes:  (Nursing/MHT/Case Management/Adjunct)  Date:  01/26/2017  Time:  9:32 PM  Type of Therapy:  Group Therapy  Participation Level:  Active  Participation Quality:  Appropriate  Affect:  Appropriate  Cognitive:  Appropriate  Insight:  Appropriate  Engagement in Group:  Engaged  Modes of Intervention:  Discussion  Summary of Progress/Problems:  Tony Cunningham 01/26/2017, 9:32 PM

## 2017-01-27 ENCOUNTER — Inpatient Hospital Stay (HOSPITAL_COMMUNITY)
Admission: AD | Admit: 2017-01-27 | Discharge: 2017-01-27 | Disposition: A | Discharge status: Home / Self Care | Payer: Medicare Other | Attending: Psychiatry | Admitting: Psychiatry

## 2017-01-27 ENCOUNTER — Inpatient Hospital Stay: Payer: Medicare Other | Admitting: Anesthesiology

## 2017-01-27 DIAGNOSIS — F251 Schizoaffective disorder, depressive type: Secondary | ICD-10-CM

## 2017-01-27 LAB — GLUCOSE, CAPILLARY
GLUCOSE-CAPILLARY: 161 mg/dL — AB (ref 65–99)
Glucose-Capillary: 153 mg/dL — ABNORMAL HIGH (ref 65–99)

## 2017-01-27 IMAGING — CR DG MYELOGRAPHY LUMBAR INJ LUMBOSACRAL
10 series · 10 of 10 positions shown · non-contrast
Comparison: MRI 08/28/2015

CLINICAL DATA: Low back and bilateral leg pain.  Spondylosis.

[w lumbar spine lat]
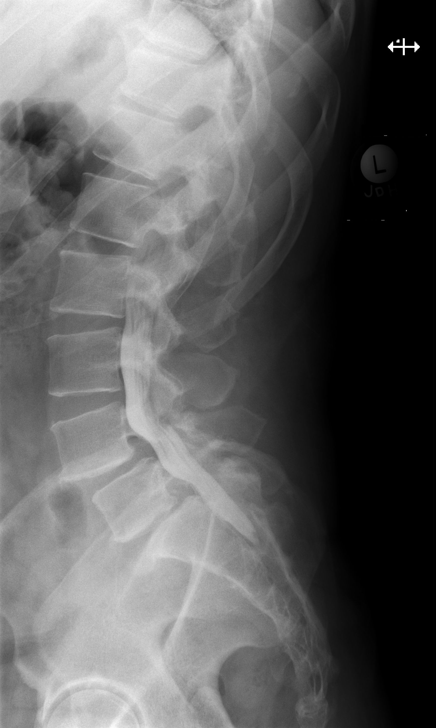

[vasc adipose (1 of 7)]
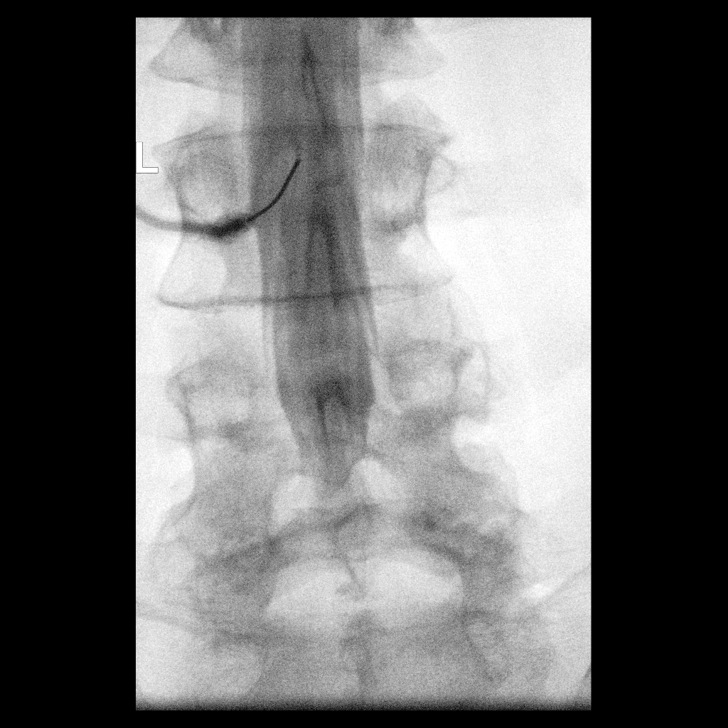

[w lumbar spine flexion]
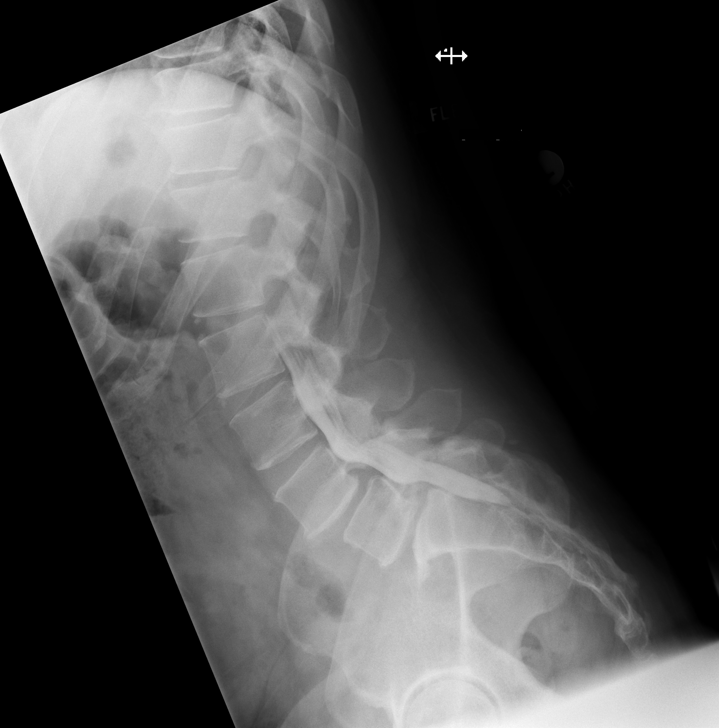

[vasc adipose (2 of 7)]
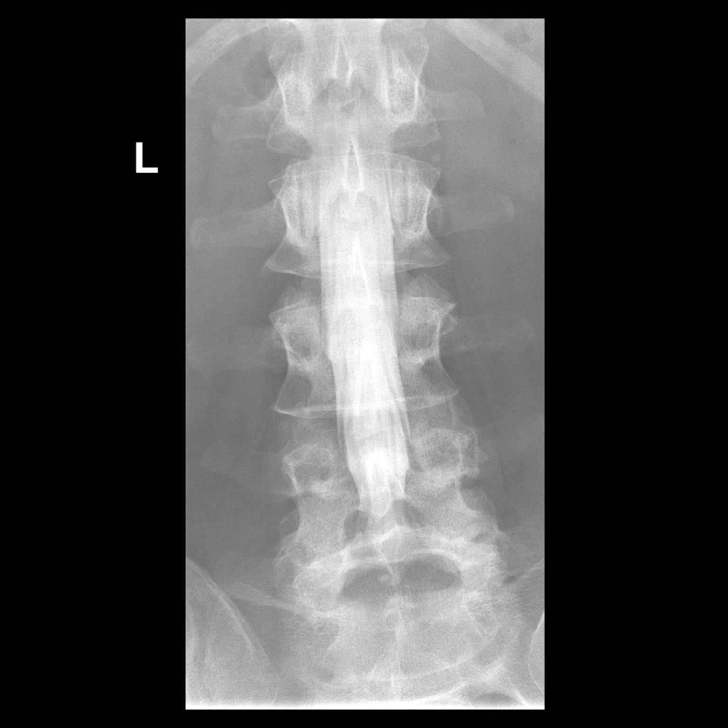

[vasc adipose (3 of 7)]
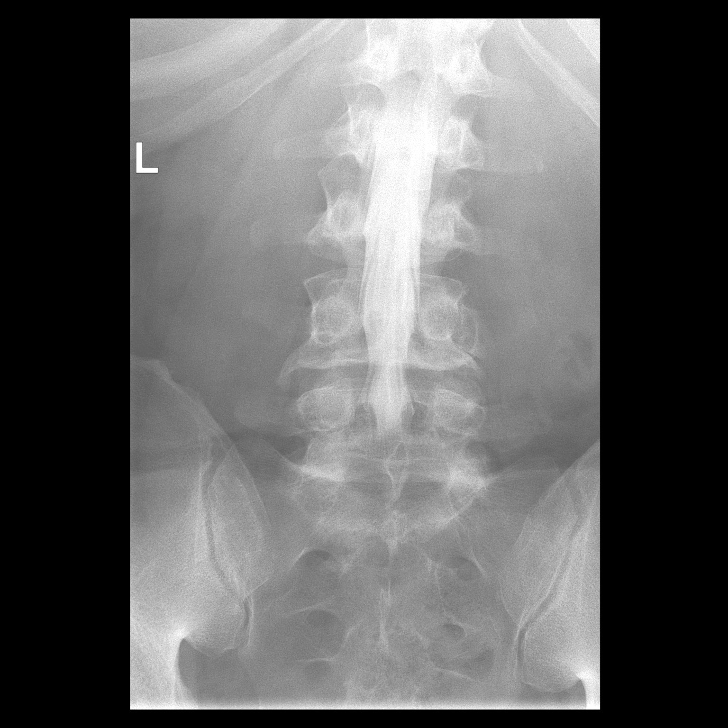

[w lumbar spine extension]
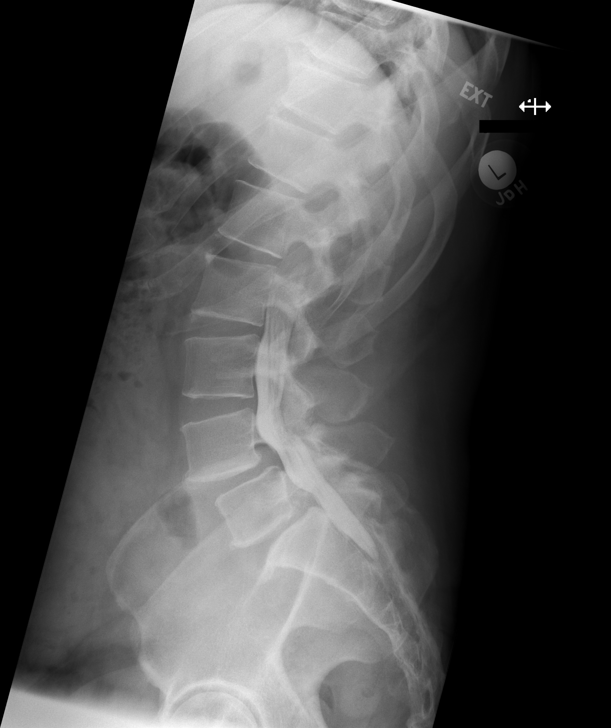

[vasc adipose (4 of 7)]
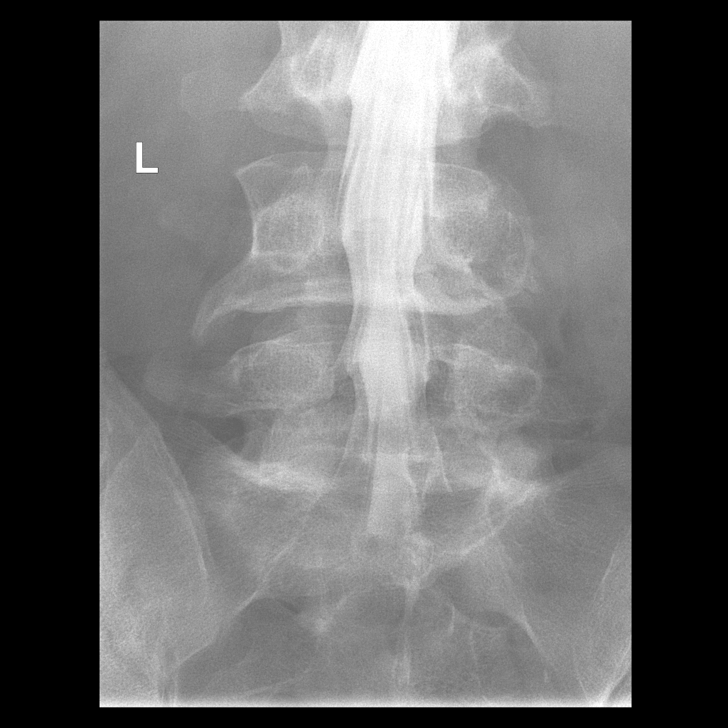

[vasc adipose (5 of 7)]
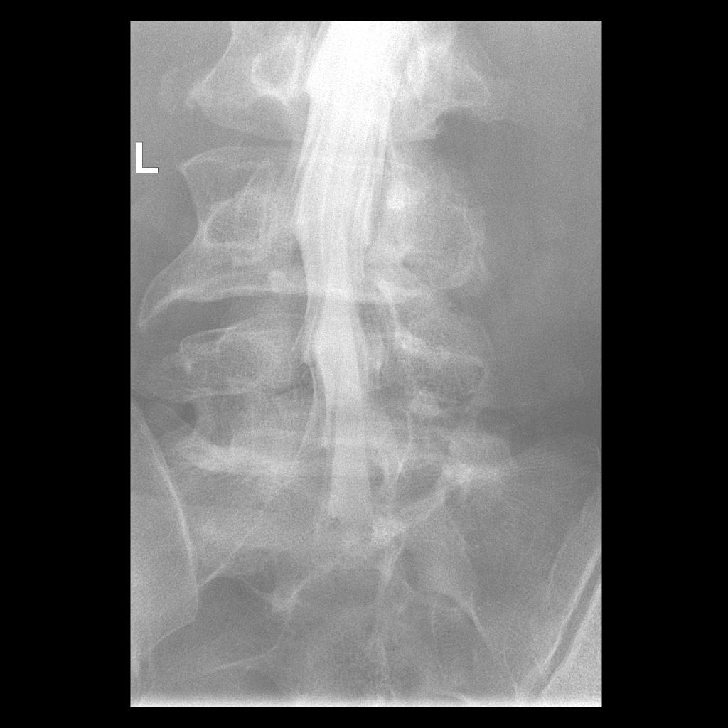

[vasc adipose (6 of 7)]
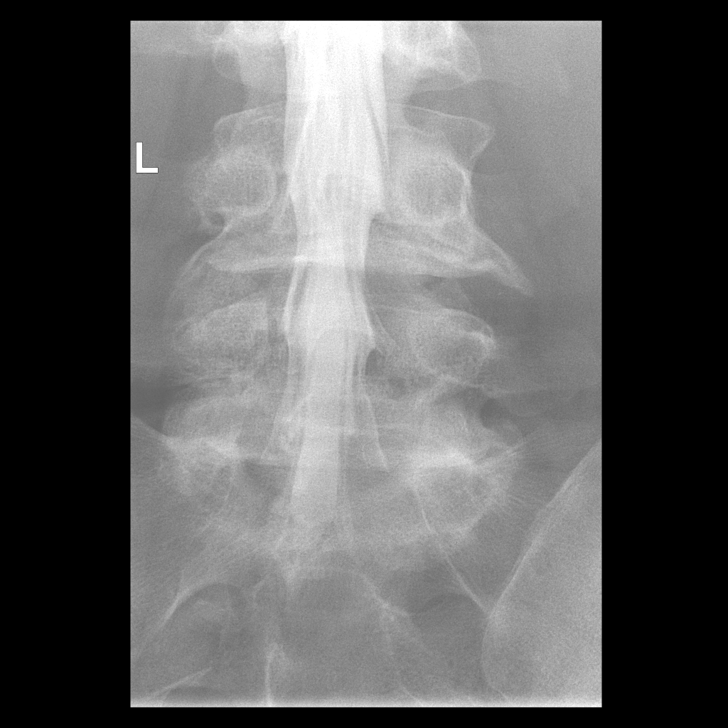

[vasc adipose (7 of 7)]
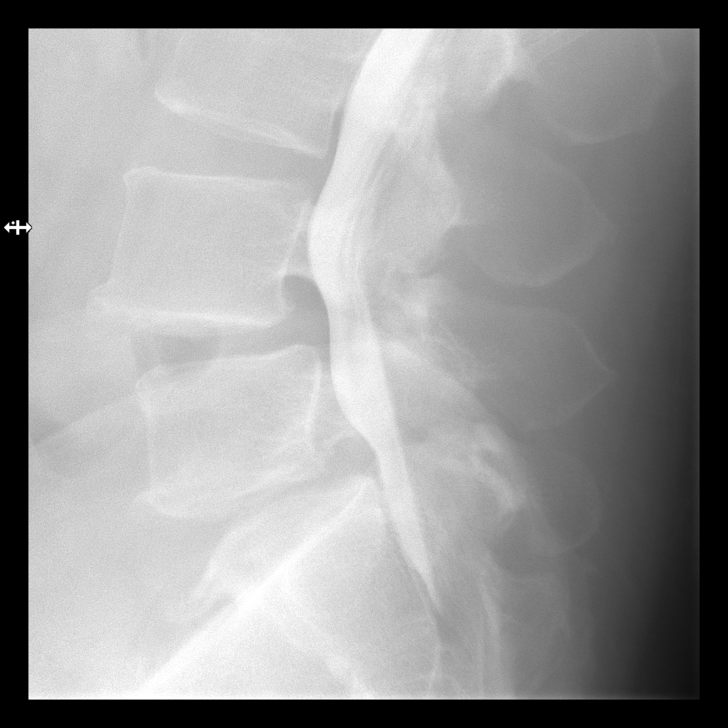

[10 of 10 positions shown; findings below may reference images not displayed]

FLUOROSCOPY TIME:  0 minutes 42 seconds. 219.36 micro gray meter
squared

PROCEDURE:
LUMBAR MYELOGRAM

Procedure: After thorough discussion of risks and benefits of the
procedure including bleeding, infection, injury to nerves, blood
vessels, adjacent structures as well as headache and CSF leak,
written and oral informed consent was obtained. Consent was obtained
by Dr. Heinrik Seo. Time out form was completed.

Patient was positioned prone on the fluoroscopy table. Local
anesthesia was provided with 1% lidocaine without epinephrine after
prepped and draped in the usual sterile fashion. Puncture was
performed at L2-3 using a 3.5 inch 22-gauge spinal needle via left
para median approach. Using a single pass through the dura, the
needle was placed within the thecal sac, with return of clear CSF.
10 cc of Isovue 200 was injected into the thecal sac, with normal
opacification of the nerve roots and cauda equina consistent with
free flow within the subarachnoid space.

I personally performed the lumbar puncture and administered the
intrathecal contrast. I also personally performed acquisition of the
myelogram images.

CT MYELOGRAM LUMBAR

Procedure: CT imaging of the lumbar spine was performed after
intrathecal contrast administration. Multiplanar CT image
reconstructions were also generated.
FINDINGS: LUMBAR MYELOGRAM FINDINGS:

No abnormality is seen at L3-4 or above. At L4-5 and L5-S1, there is
stenosis of both lateral recesses, more pronounced on the right.
There are pars defects at L4 and L5 with 9 mm of anterolisthesis at
L4-5 and 13 mm of anterolisthesis at L5-S1. This does not change
measurable E with flexion and extension.

CT LUMBAR MYELOGRAM FINDINGS:

No abnormality at L3-4 or above. The discs are normal. The canal and
foramina are widely patent. The distal cord and conus are normal
with the conus tip at L1.

L4-5: Chronic bilateral pars interarticularis defects with
anterolisthesis of 7 mm. Disc degeneration with shallow protrusion
of disc material. Mild narrowing of the lateral recesses. Foraminal
stenosis bilaterally that could affect either or both L4 nerve
roots.

L5-S1: Chronic bilateral pars interarticularis defects with
anterolisthesis of 10 mm. Disc degeneration with broad-based disc
herniation. No central canal stenosis. Foraminal stenosis
bilaterally that could compress either or both L5 nerve roots.

Mild sacroiliac osteoarthritis.
IMPRESSION: Chronic bilateral pars defects at L4 and L5. 7 mm of anterolisthesis
at L4-5 and 10 mm of anterolisthesis at L5-S1 as measured at CT. No
additional motion is demonstrated at these levels at myelography.
There is foraminal stenosis bilaterally at L4-5 and L5-S1 that could
compress either or both L4 and L5 nerve roots.

## 2017-01-27 MED ORDER — MIDAZOLAM HCL 2 MG/2ML IJ SOLN
INTRAMUSCULAR | Status: AC
  Filled 2017-01-27: qty 4

## 2017-01-27 MED ORDER — SODIUM CHLORIDE 0.9 % IV SOLN
INTRAVENOUS | Status: DC | PRN
  Administered 2017-01-27: 10:00:00 via INTRAVENOUS

## 2017-01-27 MED ORDER — SODIUM CHLORIDE 0.9 % IV SOLN
500.0000 mL | Freq: Once | INTRAVENOUS | Status: AC
  Administered 2017-01-27: 1000 mL via INTRAVENOUS

## 2017-01-27 MED ORDER — FENTANYL CITRATE (PF) 100 MCG/2ML IJ SOLN: 25.0000 ug | INTRAMUSCULAR | Status: DC | PRN

## 2017-01-27 MED ORDER — FLUOXETINE HCL 20 MG PO CAPS
40.0000 mg | ORAL_CAPSULE | Freq: Every day | ORAL | Status: DC
  Administered 2017-01-28 – 2017-02-10 (×14): 40 mg via ORAL
  Filled 2017-01-27 (×14): qty 2

## 2017-01-27 MED ORDER — HALOPERIDOL LACTATE 5 MG/ML IJ SOLN
INTRAMUSCULAR | Status: DC | PRN
  Administered 2017-01-27: 5 mg via INTRAVENOUS

## 2017-01-27 MED ORDER — METHOHEXITAL SODIUM 100 MG/10ML IV SOSY
PREFILLED_SYRINGE | INTRAVENOUS | Status: DC | PRN
  Administered 2017-01-27: 80 mg via INTRAVENOUS

## 2017-01-27 MED ORDER — SUCCINYLCHOLINE CHLORIDE 200 MG/10ML IV SOSY
PREFILLED_SYRINGE | INTRAVENOUS | Status: DC | PRN
  Administered 2017-01-27: 100 mg via INTRAVENOUS

## 2017-01-27 MED ORDER — HALOPERIDOL LACTATE 5 MG/ML IJ SOLN
INTRAMUSCULAR | Status: AC
  Filled 2017-01-27: qty 1

## 2017-01-27 MED ORDER — MIDAZOLAM HCL 2 MG/2ML IJ SOLN
4.0000 mg | Freq: Once | INTRAMUSCULAR | Status: AC
  Administered 2017-01-27: 4 mg via INTRAVENOUS

## 2017-01-27 MED ORDER — SUCCINYLCHOLINE CHLORIDE 20 MG/ML IJ SOLN
INTRAMUSCULAR | Status: AC
  Filled 2017-01-27: qty 1

## 2017-01-27 MED ORDER — ONDANSETRON HCL 4 MG/2ML IJ SOLN: 4.0000 mg | Freq: Once | INTRAMUSCULAR | Status: DC | PRN

## 2017-01-27 NOTE — Progress Notes (Signed)
Report given to GIgi    Pt only knows name  That he is hospital and day of week

## 2017-01-27 NOTE — Progress Notes (Signed)
BDaily contact with patient to assess and evaluate symptoms and progress in treatment, Medication management and Plan [facility] MD Progress Note  01/27/2017 5:45 PM Tony Cunningham  MRN:  [id number] Subjective:  41 year old man with a history of schizoaffective disorder who came back into the hospital only shortly after his most recent discharge this time reporting hallucinations and suicidal ideation. On evaluation today patient is in bed staying withdrawn and hardly ever attending groups. He tells me his mood is very sad and feels frightened. He says he's having auditory hallucinations and active wishes that he were dead. He is not acting on any of these and is not aggressive. Patient has been compliant with medication. He admits to me that he really would rather stay in the hospital forever and does not really ever want to be placed in a group home or any other kind of even semi-independent living situation.  Follow-up for Thursday the 30th. Patient seen chart reviewed. This 41 year old man with chronic severe mental health problems and failure to transition to outpatient care is once again complaining of hallucinations. He is withdrawn most of the time. Minimal interaction with other people. Denies active suicidal thoughts. Hardly ever gets around other people though. Tolerating medicines well. His blood sugars are reasonably stable although he once again had an over 300 reading this evening.  Follow-up for Friday the 31st. Patient had ECT bilateral treatment this morning which was well tolerated. Seen this afternoon in treatment team he had no new complaint. Continues to describe himself as very depressed. Continues to report suicidal thoughts and hallucinations. Rarely gets out of his bed and attends almost no groups or therapeutic activities. Blood sugars remained somewhat labile with an elevated sugar this morning.  01/23/17 - patient reports that he continues to have monitoring hallucinations which are  troublesome and distressing. The voices make him feel suicidal. Spent most of the day in bed, saw him walking around in the evening. Denies active plan but does endorse that he has transient thoughts of suicide.  01/24/17 - patient reports that he feels "so-so". Continues to have monitoring hallucinations although not as severe as yesterday. Endorses that he had a good night's rest. I tried to clarify the dosing and duration of clozapine from him but he states that he does not know.  Follow-up for September 3. Patient still lying in bed all day. He tells me that he still hears voices but they are "less". He can't really quantify that. Denies active suicidal thoughts. Admits that he worries constantly about how he will function outside of the hospital. Chart reviewed. I note that the last clozapine level that came back was in the middle of last month and was well into the therapeutic range.  All up for September 5. Patient seen. Chart reviewed. He had ECT today which went well as usual. He continues to complain of feeling confused and of feeling very anxious. Says he has racing thoughts and suicidal thoughts. Stays very withdrawn in his bed.  Principal Problem: Schizoaffective disorder, depressive type (HCC) Diagnosis:   Patient Active Problem List   Diagnosis Date Noted  . 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 Schizoaffective disorder, depressive type (HCC) [F25.1] 09/23/2016  . Polysubstance abuse [F19.10] 07/17/2016  . Tardive dyskinesia [G24.01] 07/16/2016  . Tobacco use disorder [F17.200] 07/07/2016  . Dyslipidemia [E78.5] 07/07/2016  . Asthma [J45.909] 07/07/2016  . HTN (hypertension) [I10] 07/06/2016  . Diabetes (HCC) [E11.9] 12/25/2010   Total Time spent with patient: 30 minutes  Past  Psychiatric History: Patient has a long-standing history of mental illness but over the last year or so seems to of decompensated more than usual and has had one hospitalization  after another. He did show some response to ECT and antipsychotic medication but seems to decompensate immediately whenever he is discharged.  Past Medical History:  Past Medical History:  Diagnosis Date  . Anxiety   . Asthma   . Diabetes mellitus   . High blood pressure   . Sinus complaint    History reviewed. No pertinent surgical history. Family History: History reviewed. No pertinent family history. Family Psychiatric  History: Unknown Social History:  History  Alcohol Use No     History  Drug Use No    Social History   Social History  . Marital status: Single    Spouse name: [name]  . Number of children: [name]  . Years of education: [name]   Social History Main Topics  . Smoking status: Current Every Day Smoker    Packs/day: 0.50    Types: Cigarettes  . Smokeless tobacco: Never Used  . Alcohol use No  . Drug use: No  . Sexual activity: Not Currently   Other Topics Concern  . None   Social History Narrative  . None   Additional Social History:                         Sleep: Fair  Appetite:  Poor  Current Medications: Current Facility-Administered Medications  Medication Dose Route Frequency Provider Last Rate Last Dose  . acetaminophen (TYLENOL) tablet 650 mg  650 mg Oral Q6H PRN Amiylah Anastos, [name], MD   650 mg at 01/22/17 1247  . alum & mag hydroxide-simeth (MAALOX/MYLANTA) 200-200-20 MG/5ML suspension 30 mL  30 mL Oral Q4H PRN Brenen Beigel T, MD      . cloZAPine (CLOZARIL) tablet 150 mg  150 mg Oral QHS Soniya Ashraf, [name], MD   150 mg at 01/26/17 2130  . FLUoxetine (PROZAC) capsule 20 mg  20 mg Oral Daily Linnie Mcglocklin, [name], MD   20 mg at 01/27/17 1238  . insulin aspart (novoLOG) injection 0-5 Units  0-5 Units Subcutaneous QHS Cambri Plourde, [name], MD   4 Units at 01/21/17 2229  . insulin aspart (novoLOG) injection 0-9 Units  0-9 Units Subcutaneous TID WC Tikisha Molinaro, [name], MD   3 Units at 01/27/17 1637  . insulin aspart (novoLOG) injection 3 Units  3 Units  Subcutaneous TID WC Kacelyn Rowzee, [name], MD   3 Units at 01/27/17 1637  . insulin glargine (LANTUS) injection 35 Units  35 Units Subcutaneous QHS Grazia Taffe, [name], MD   35 Units at 01/26/17 2130  . lisinopril (PRINIVIL,ZESTRIL) tablet 20 mg  20 mg Oral Daily Amun Stemm, [name], MD   20 mg at 01/27/17 0931  . magnesium hydroxide (MILK OF MAGNESIA) suspension 30 mL  30 mL Oral Daily PRN Acsa Estey T, MD      . metFORMIN (GLUCOPHAGE) tablet 1,000 mg  1,000 mg Oral BID WC Samanvitha Germany T, MD   1,000 mg at 01/27/17 1640  . montelukast (SINGULAIR) tablet 10 mg  10 mg Oral QHS Ted Leonhart, [name], MD   10 mg at 01/26/17 2130  . nicotine (NICODERM CQ - dosed in mg/24 hours) patch 21 mg  21 mg Transdermal Daily [name], [name], MD      . pantoprazole (PROTONIX) EC tablet 40 mg  40 mg Oral Daily Nisaiah Bechtol T,  MD   40 mg at 01/26/17 0757  . propranolol (INDERAL) tablet 20 mg  20 mg Oral BID Jarian Longoria, [name], MD   20 mg at 01/27/17 1640    Lab Results:  Results for orders placed or performed during the hospital encounter of 01/19/17 (from the past 48 hour(s))  Glucose, capillary     Status: Abnormal   Collection Time: 01/25/17  8:33 PM  Result Value Ref Range   Glucose-Capillary 145 (H) 65 - 99 mg/dL  Glucose, capillary     Status: Abnormal   Collection Time: 01/26/17  7:07 AM  Result Value Ref Range   Glucose-Capillary 215 (H) 65 - 99 mg/dL  Glucose, capillary     Status: Abnormal   Collection Time: 01/26/17 11:38 AM  Result Value Ref Range   Glucose-Capillary 183 (H) 65 - 99 mg/dL  Glucose, capillary     Status: Abnormal   Collection Time: 01/26/17  4:30 PM  Result Value Ref Range   Glucose-Capillary 118 (H) 65 - 99 mg/dL  Glucose, capillary     Status: Abnormal   Collection Time: 01/26/17  8:33 PM  Result Value Ref Range   Glucose-Capillary 183 (H) 65 - 99 mg/dL   Comment 1 Notify RN   Glucose, capillary     Status: Abnormal   Collection Time: 01/27/17  6:23 AM  Result Value Ref Range    Glucose-Capillary 153 (H) 65 - 99 mg/dL   Comment 1 Notify RN     Blood Alcohol level:  Lab Results  Component Value Date   ETH <5 01/16/2017   ETH <5 12/21/2016    Metabolic Disorder Labs: Lab Results  Component Value Date   HGBA1C 7.6 (H) 01/19/2017   MPG 171.42 01/19/2017   MPG 249 11/09/2016   Lab Results  Component Value Date   PROLACTIN 24.5 (H) 09/24/2016   PROLACTIN 3.4 (L) 07/07/2016   Lab Results  Component Value Date   CHOL 122 01/19/2017   TRIG 154 (H) 01/19/2017   HDL 36 (L) 01/19/2017   CHOLHDL 3.4 01/19/2017   VLDL 31 01/19/2017   LDLCALC 55 01/19/2017   LDLCALC 59 12/28/2016    Physical Findings: AIMS: Facial and Oral Movements Muscles of Facial Expression: None, normal Lips and Perioral Area: None, normal Jaw: None, normal Tongue: None, normal,Extremity Movements Upper (arms, wrists, hands, fingers): Mild Lower (legs, knees, ankles, toes): None, normal, Trunk Movements Neck, shoulders, hips: None, normal, Overall Severity Severity of abnormal movements (highest score from questions above): Minimal Incapacitation due to abnormal movements: None, normal Patient's awareness of abnormal movements (rate only patient's report): No Awareness, Dental Status Current problems with teeth and/or dentures?: No Does patient usually wear dentures?: No  CIWA:    COWS:  COWS Total Score: 1  Musculoskeletal: Strength & Muscle Tone: decreased Gait & Station: normal Patient leans: [name]  Psychiatric Specialty Exam: Physical Exam  Nursing note and vitals reviewed. Constitutional: He appears well-developed and well-nourished.  HENT:  Head: Normocephalic and atraumatic.  Eyes: Pupils are equal, round, and reactive to light. Conjunctivae are normal.  Neck: Normal range of motion.  Cardiovascular: Regular rhythm and normal heart sounds.   Respiratory: Effort normal and breath sounds normal. No respiratory distress.  GI: Soft.  Musculoskeletal: Normal range of  motion.  Neurological: He is alert.  Skin: Skin is warm and dry.  Psychiatric: His affect is blunt. His speech is delayed. He is slowed and withdrawn. Thought content is paranoid. Cognition and memory are impaired. He  expresses impulsivity. He expresses suicidal ideation. He expresses no homicidal ideation.    Review of Systems  Constitutional: Negative.   HENT: Negative.   Eyes: Negative.   Respiratory: Negative.   Cardiovascular: Negative.   Gastrointestinal: Negative.   Musculoskeletal: Negative.   Skin: Negative.   Neurological: Negative.   Psychiatric/Behavioral: Positive for depression, hallucinations, memory loss and suicidal ideas. Negative for substance abuse. The patient is nervous/anxious and has insomnia.     Blood pressure 123/82, pulse 81, temperature 98.5 F (36.9 C), resp. rate 18, height 6' (1.829 m), weight 173 lb (78.5 kg), SpO2 100 %.Body mass index is 23.46 kg/m.  General Appearance: Disheveled  Eye Contact:  Fair  Speech:  Slow  Volume:  Decreased  Mood:  Depressed and Dysphoric  Affect:  Constricted and Depressed  Thought Process:  Disorganized  Orientation:  Full (Time, Place, and Person)  Thought Content:  Illogical, Hallucinations: Auditory and Paranoid Ideation  Suicidal Thoughts:  Yes.  without intent/plan  Homicidal Thoughts:  No  Memory:  Immediate;   Good Recent;   Fair Remote;   Fair  Judgement:  Impaired  Insight:  Shallow  Psychomotor Activity:  Decreased  Concentration:  Concentration: Poor  Recall:  Poor  Fund of Knowledge:  Poor  Language:  Fair  Akathisia:  No  Handed:  Right  AIMS (if indicated):     Assets:  Desire for Improvement  ADL's:  Impaired  Cognition:  Impaired,  Mild  Sleep:  Number of Hours: 5.45   Patient was schizoaffective disorder getting ECT and also medication stabilization. No real reason I don't think to increase the clozapine. I see that Zyprexa was added in the last day or so but I doubt that is going to be  helpful and may make his blood sugar problem worse. We don't need anything to make him more sedated. I'm going to hold that any increase the dose of Celexa to 40 mg a day. As always encouraged him to get up out of bed and be more active. #Schizoaffective disorder, SI - Continue ECT - Next one on 01/27/17 - Continue clozapine 150 mg QHS, aim to optimize dose of antipsychotic monotherapy. Level pending and will hold at 150 mg.  -  -   #Smoking cessation Continue nicotine patch 21 mg/24 hours Continue propranolol 20 mg BID  #DM Continue metformin 1000 mg BIDCC Insulin sliding scale  #GERD Continue protonix EC 40 mg daily.    Lipid Panel     Component Value Date/Time   CHOL 122 01/19/2017 0637   TRIG 154 (H) 01/19/2017 0637   HDL 36 (L) 01/19/2017 0637   CHOLHDL 3.4 01/19/2017 0637   VLDL 31 01/19/2017 0637   LDLCALC 55 01/19/2017 0637  Discontinue the citalopram. Restarted a new antidepressant to hopefully allow [place] to get the dose up higher. Continue ECT and continue encouragement for him to get out of bed and be more active. No new medical changes. Blood sugars continue to run high.  Mordecai Rasmussen, MD 01/27/2017, 5:45 PM

## 2017-01-27 NOTE — Tx Team (Signed)
Interdisciplinary Treatment and Diagnostic Plan Update  01/27/2017 Time of Session: [place] MRN: [id number]  Principal Diagnosis: Schizoaffective disorder, depressive type (Tony Cunningham)  Secondary Diagnoses: Principal Problem:   Schizoaffective disorder, depressive type (Tony Cunningham) Active Problems:   Diabetes (Tony Cunningham)   HTN (hypertension)   Tobacco use disorder   Dyslipidemia   Tardive dyskinesia   Current Medications:  Current Facility-Administered Medications  Medication Dose Route Frequency Provider Last Rate Last Dose  . acetaminophen (TYLENOL) tablet 650 mg  650 mg Oral Q6H PRN [name], Madie Reno, MD   650 mg at 01/22/17 1247  . alum & mag hydroxide-simeth (MAALOX/MYLANTA) 200-200-20 MG/5ML suspension 30 mL  30 mL Oral Q4H PRN [name], [name], MD      . cloZAPine (CLOZARIL) tablet 150 mg  150 mg Oral QHS [name], Madie Reno, MD   150 mg at 01/26/17 2130  . FLUoxetine (PROZAC) capsule 20 mg  20 mg Oral Daily [name], Madie Reno, MD   20 mg at 01/27/17 1238  . insulin aspart (novoLOG) injection 0-5 Units  0-5 Units Subcutaneous QHS [name], Madie Reno, MD   4 Units at 01/21/17 2229  . insulin aspart (novoLOG) injection 0-9 Units  0-9 Units Subcutaneous TID WC [name], Madie Reno, MD   2 Units at 01/27/17 1238  . insulin aspart (novoLOG) injection 3 Units  3 Units Subcutaneous TID WC [name], Madie Reno, MD   3 Units at 01/27/17 1237  . insulin glargine (LANTUS) injection 35 Units  35 Units Subcutaneous QHS [name], Madie Reno, MD   35 Units at 01/26/17 2130  . lisinopril (PRINIVIL,ZESTRIL) tablet 20 mg  20 mg Oral Daily [name], Madie Reno, MD   20 mg at 01/27/17 0931  . magnesium hydroxide (MILK OF MAGNESIA) suspension 30 mL  30 mL Oral Daily PRN [name], [name], MD      . metFORMIN (GLUCOPHAGE) tablet 1,000 mg  1,000 mg Oral BID WC [name], [name], MD   1,000 mg at 01/26/17 1643  . montelukast (SINGULAIR) tablet 10 mg  10 mg Oral QHS [name], Madie Reno, MD   10 mg at 01/26/17 2130  . nicotine (NICODERM CQ - dosed in mg/24  hours) patch 21 mg  21 mg Transdermal Daily [name], [name], MD      . pantoprazole (PROTONIX) EC tablet 40 mg  40 mg Oral Daily [name], Madie Reno, MD   40 mg at 01/26/17 0757  . propranolol (INDERAL) tablet 20 mg  20 mg Oral BID [name], Madie Reno, MD   20 mg at 01/27/17 0102   PTA Medications: Prescriptions Prior to Admission  Medication Sig Dispense Refill Last Dose  . amantadine (SYMMETREL) 100 MG capsule Take 1 capsule (100 mg total) by mouth 2 (two) times daily. For prevention of drug induced tremors 60 capsule 0 Unknown at Unknown  . benztropine (COGENTIN) 1 MG tablet Take 1 tablet (1 mg total) by mouth 2 (two) times daily. For prevention of drug induced tremors 60 tablet 0 Unknown at Unknown  . cholecalciferol 400 units tablet Take 1 tablet (400 Units total) by mouth daily. For bone health 30 each 0 Unknown at Unknown  . citalopram (CELEXA) 20 MG tablet Take 1 tablet (20 mg total) by mouth daily. For depression 30 tablet 0 Unknown at Unknown  . cloZAPine (CLOZARIL) 50 MG tablet Take 3 tablets (150 mg total) by mouth at bedtime. For mood control 90 tablet 0 Unknown at Unknown  . cyclobenzaprine (FLEXERIL) 5 MG tablet Take 1 tablet (5  mg total) by mouth 3 (three) times daily as needed for muscle spasms. 15 tablet 0 Unknown at Unknown  . diclofenac sodium (VOLTAREN) 1 % GEL Apply 2 g topically 4 (four) times daily. For arthritis pain   Unknown at Unknown  . insulin aspart (NOVOLOG) 100 UNIT/ML injection Inject 6 Units into the skin 3 (three) times daily with meals. 10 mL 11 Unknown at Unknown  . insulin glargine (LANTUS) 100 UNIT/ML injection Inject 0.45 mLs (45 Units total) into the skin at bedtime. For diabetic management 10 mL 0   . insulin glargine (LANTUS) 100 UNIT/ML injection Inject 0.4 mLs (40 Units total) into the skin at bedtime. 10 mL 11   . lisinopril (PRINIVIL,ZESTRIL) 20 MG tablet Take 1 tablet (20 mg total) by mouth daily. For high blood pressure 30 tablet 0 Unknown at Unknown   . metFORMIN (GLUCOPHAGE) 1000 MG tablet Take 1 tablet (1,000 mg total) by mouth 2 (two) times daily with a meal. For diabetes management 60 tablet 0 Unknown at Unknown  . montelukast (SINGULAIR) 10 MG tablet Take 1 tablet (10 mg total) by mouth at bedtime. For shortness of breath 30 tablet 0 Unknown at Unknown  . nicotine polacrilex (NICORETTE) 2 MG gum Take 1 each (2 mg total) by mouth as needed for smoking cessation. 100 tablet 0 Unknown at Unknown  . pantoprazole (PROTONIX) 40 MG tablet Take 1 tablet (40 mg total) by mouth daily. For acid reflux 30 tablet 0 Unknown at Unknown  . polyethylene glycol (MIRALAX / GLYCOLAX) packet Take 17 g by mouth daily. For constipation 14 each 0 Unknown at Unknown  . propranolol (INDERAL) 20 MG tablet Take 1 tablet (20 mg total) by mouth 2 (two) times daily. For high blood pressure 60 tablet 0 01/27/2017 at 0619  . simvastatin (ZOCOR) 20 MG tablet Take 1 tablet (20 mg total) by mouth daily at 6 PM. For high Cholesterol 30 tablet 0 Unknown at Unknown  . temazepam (RESTORIL) 7.5 MG capsule Take 1 capsule (7.5 mg total) by mouth at bedtime. For sleep 14 capsule 0 Unknown at Unknown    Patient Stressors: Health problems Medication change or noncompliance  Patient Strengths: Active sense of humor Motivation for treatment/growth  Treatment Modalities: Medication Management, Group therapy, Case management,  1 to 1 session with clinician, Psychoeducation, Recreational therapy.   Physician Treatment Plan for Primary Diagnosis: Schizoaffective disorder, depressive type (Tony Cunningham) Long Term Goal(s): Improvement in symptoms so as ready for discharge NA   Short Term Goals: Ability to identify changes in lifestyle to reduce recurrence of condition will improve Ability to verbalize feelings will improve Ability to disclose and discuss suicidal ideas Ability to demonstrate self-control will improve Ability to identify and develop effective coping behaviors will  improve Ability to maintain clinical measurements within normal limits will improve Ability to identify triggers associated with substance abuse/mental health issues will improve NA  Medication Management: Evaluate patient's response, side effects, and tolerance of medication regimen.  Therapeutic Interventions: 1 to 1 sessions, Unit Group sessions and Medication administration.  Evaluation of Outcomes: Not Met  Physician Treatment Plan for Secondary Diagnosis: Principal Problem:   Schizoaffective disorder, depressive type ([place]) Active Problems:   Diabetes ([place])   HTN (hypertension)   Tobacco use disorder   Dyslipidemia   Tardive dyskinesia  Long Term Goal(s): Improvement in symptoms so as ready for discharge NA   Short Term Goals: Ability to identify changes in lifestyle to reduce recurrence of condition will improve Ability to verbalize feelings  will improve Ability to disclose and discuss suicidal ideas Ability to demonstrate self-control will improve Ability to identify and develop effective coping behaviors will improve Ability to maintain clinical measurements within normal limits will improve Ability to identify triggers associated with substance abuse/mental health issues will improve NA     Medication Management: Evaluate patient's response, side effects, and tolerance of medication regimen.  Therapeutic Interventions: 1 to 1 sessions, Unit Group sessions and Medication administration.  Evaluation of Outcomes: Not Met   RN Treatment Plan for Primary Diagnosis: Schizoaffective disorder, depressive type ([place]) Long Term Goal(s): Knowledge of disease and therapeutic regimen to maintain health will improve  Short Term Goals: Ability to identify and develop effective coping behaviors will improve and Compliance with prescribed medications will improve  Medication Management: RN will administer medications as ordered by provider, will assess and evaluate patient's  response and provide education to patient for prescribed medication. RN will report any adverse and/or side effects to prescribing provider.  Therapeutic Interventions: 1 on 1 counseling sessions, Psychoeducation, Medication administration, Evaluate responses to treatment, Monitor vital signs and CBGs as ordered, Perform/monitor CIWA, COWS, AIMS and Fall Risk screenings as ordered, Perform wound care treatments as ordered.  Evaluation of Outcomes: Not Met   LCSW Treatment Plan for Primary Diagnosis: Schizoaffective disorder, depressive type ([place]) Long Term Goal(s): Safe transition to appropriate next level of care at discharge, Engage patient in therapeutic group addressing interpersonal concerns.  Short Term Goals: Engage patient in aftercare planning with referrals and resources and Increase skills for wellness and recovery  Therapeutic Interventions: Assess for all discharge needs, 1 to 1 time with Social worker, Explore available resources and support systems, Assess for adequacy in community support network, Educate family and significant other(s) on suicide prevention, Complete Psychosocial Assessment, Interpersonal group therapy.  Evaluation of Outcomes: Not Met   Progress in Treatment: Attending groups: No. Participating in groups: No. Taking medication as prescribed: Yes. Toleration medication: Yes. Family/Significant other contact made: No, will contact:  when given permission Patient understands diagnosis: Yes. Discussing patient identified problems/goals with staff: Yes. Medical problems stabilized or resolved: Yes. Denies suicidal/homicidal ideation: Yes. Issues/concerns per patient self-inventory: No. Other: none  New problem(s) identified: No, Describe:  none  New Short Term/Long Term Goal(s): Pt unable to formulate a goal.  Discharge Plan or Barriers: CSW assessing for appropriate plan.  Reason for Continuation of Hospitalization: Other; describe ECT  Estimated  Length of Stay: 7 days  Attendees: Patient: 01/27/2017   Physician: Dr. Weber Cooks, MD 01/27/2017   Nursing:  01/27/2017   RN Care Manager: 01/27/2017   Social Worker: Lurline Idol, LCSW 01/27/2017   Recreational Therapist:  01/27/2017   Other:  01/27/2017   Other:  01/27/2017   Other: 01/27/2017           Scribe for Treatment Team: Joanne Chars, [place] 01/27/2017 2:08 PM

## 2017-01-27 NOTE — Anesthesia Procedure Notes (Signed)
Date/Time: 01/27/2017 11:03 AM Performed by: Lily Kocher Pre-anesthesia Checklist: Patient identified, Emergency Drugs available, Suction available and Patient being monitored Patient Re-evaluated:Patient Re-evaluated prior to induction Oxygen Delivery Method: Circle system utilized Preoxygenation: Pre-oxygenation with 100% oxygen Induction Type: IV induction Ventilation: Mask ventilation without difficulty and Mask ventilation throughout procedure Airway Equipment and Method: Bite block Placement Confirmation: positive ETCO2 Dental Injury: Teeth and Oropharynx as per pre-operative assessment

## 2017-01-27 NOTE — Progress Notes (Signed)
When talking to pt  He opens his eyes and looks at staff but does not talk

## 2017-01-27 NOTE — [facility] Group Notes (Signed)
[facility] LCSW Group Therapy Note  Date/Time Sept 5th from 9:30-10:15 Type of Therapy/Topic: Group Therapy: Emotion Regulation  Participation Level: Patient was invited to attend but did not participate   Freddi Forster LCSW 

## 2017-01-27 NOTE — Transfer of Care (Addendum)
Immediate Anesthesia Transfer of Care Note  Patient: Tony Cunningham  Procedure(s) Performed: ECT  Patient Location: PACU  Anesthesia Type:General  Level of Consciousness: sedated  Airway & Oxygen Therapy: Patient Spontanous Breathing and Patient connected to face mask oxygen  Post-op Assessment: Report given to RN and Post -op Vital signs reviewed and stable  Post vital signs: Reviewed and stable  Last Vitals:  Vitals:   01/27/17 1128 01/27/17 1133  BP: 119/77   Pulse: 96 73  Resp: 16 15  Temp:    SpO2: 99% 98%    Last Pain:  Vitals:   01/27/17 1133  TempSrc:   PainSc: 0-No pain         Complications: No apparent anesthesia complications

## 2017-01-27 NOTE — Progress Notes (Signed)
Pt is in a pleasant mood, still remaining a little anxious. Pt states,  "I am feeling better, I think the ECT is working". Pt was smiling and interacting with staff and peers. Pt is med compliant. Denies SI, HI or V hallucinations, pt says he is still having some auditory hallucinations but are not telling him to harm self. Pt contracted to safety. 15 min checks continue.

## 2017-01-27 NOTE — [facility] Group Notes (Signed)
[facility] Group Notes:  (Nursing/MHT/Case Management/Adjunct)  Date:  01/27/2017  Time:  2:31 PM  Type of Therapy:  Psychoeducational Skills  Participation Level:  Did Not Attend    Judene Companion 01/27/2017, 2:31 PM

## 2017-01-27 NOTE — Progress Notes (Signed)
Patient states "ECT is kind of helping me."Denies suicidal and homicidal ideations.Patient stated that at times he hear some voices.Compliant with medications.Did not attend groups.Support & encouragement given.

## 2017-01-27 NOTE — Procedures (Signed)
ECT SERVICES Physician's Interval Evaluation & Treatment Note  Patient Identification: Jw Covin MRN:  [id number] Date of Evaluation:  01/27/2017 TX #: 9  MADRS:   MMSE:   P.E. Findings:  No change to physical exam. Vitals normal heart and lungs normal.  Psychiatric Interval Note:  Depressed with psychotic features suicide all very withdrawn  Subjective:  Patient is a 41 y.o. male seen for evaluation for Electroconvulsive Therapy. Very depressed anxious frightened  Treatment Summary:   []   Right Unilateral             [x]  Bilateral   % Energy : 1.0 ms 60%   Impedance: 1330 ohms  Seizure Energy Index: 97,547 V squared  Postictal Suppression Index: No reading  Seizure Concordance Index: 87%  Medications  Pre Shock: Brevital 80 mg succinylcholine 100 mg  Post Shock: Versed 4 mg Haldol 5 mg  Seizure Duration: 41 seconds by EMG 58 seconds by EEG   Comments: Continue 3 times a week treatment next treatment Friday   Lungs:  [x]   Clear to auscultation               []  Other:   Heart:    [x]   Regular rhythm             []  irregular rhythm    [x]   Previous H&P reviewed, patient examined and there are NO CHANGES                 []   Previous H&P reviewed, patient examined and there are changes noted.   , MD 9/5/201810:57 AM

## 2017-01-27 NOTE — Plan of Care (Signed)
Problem: Activity: Goal: Sleeping patterns will improve Outcome: Progressing Patient slept for Estimated Hours of 5.45; Precautionary checks every 15 minutes for safety maintained, room free of safety hazards, patient sustains no injury or falls during this shift.    

## 2017-01-27 NOTE — Progress Notes (Signed)
Patient ID: Tony Cunningham, male   DOB: [birth year], 41 y.o.   MRN: [id number] Pleasant on approach, "I don't know why I keep hearing voices telling me to hurt myself; I am not hearing them right now because they come and go .Marland KitchenMarland KitchenMarland KitchenMarland Kitchen" Anticipated plan of care now and in the morning (ECT, NPO after MN, CBG) discussed; denied pain, attended the wrap up group.

## 2017-01-27 NOTE — H&P (Signed)
Tony Cunningham is an 41 y.o. male.   Chief Complaint: Patient is still very frightened and anxious depressed having hallucinations and suicidal thoughts. HPI: History of severe depression with psychotic features only partially responsive that time so far and currently decompensated. Previous history of positive response to ECT  Past Medical History:  Diagnosis Date  . Anxiety   . Asthma   . Diabetes mellitus   . High blood pressure   . Sinus complaint     History reviewed. No pertinent surgical history.  History reviewed. No pertinent family history. Social History:  reports that he has been smoking Cigarettes.  He has been smoking about 0.50 packs per day. He has never used smokeless tobacco. He reports that he does not drink alcohol or use drugs.  Allergies: No Known Allergies   (Not in a hospital admission)  Results for orders placed or performed during the hospital encounter of 01/19/17 (from the past 48 hour(s))  Glucose, capillary     Status: None   Collection Time: 01/25/17 11:18 AM  Result Value Ref Range   Glucose-Capillary 75 65 - 99 mg/dL  Glucose, capillary     Status: Abnormal   Collection Time: 01/25/17  4:14 PM  Result Value Ref Range   Glucose-Capillary 167 (H) 65 - 99 mg/dL  Glucose, capillary     Status: Abnormal   Collection Time: 01/25/17  8:33 PM  Result Value Ref Range   Glucose-Capillary 145 (H) 65 - 99 mg/dL  Glucose, capillary     Status: Abnormal   Collection Time: 01/26/17  7:07 AM  Result Value Ref Range   Glucose-Capillary 215 (H) 65 - 99 mg/dL  Glucose, capillary     Status: Abnormal   Collection Time: 01/26/17 11:38 AM  Result Value Ref Range   Glucose-Capillary 183 (H) 65 - 99 mg/dL  Glucose, capillary     Status: Abnormal   Collection Time: 01/26/17  4:30 PM  Result Value Ref Range   Glucose-Capillary 118 (H) 65 - 99 mg/dL  Glucose, capillary     Status: Abnormal   Collection Time: 01/26/17  8:33 PM  Result Value Ref Range   Glucose-Capillary 183 (H) 65 - 99 mg/dL   Comment 1 Notify RN   Glucose, capillary     Status: Abnormal   Collection Time: 01/27/17  6:23 AM  Result Value Ref Range   Glucose-Capillary 153 (H) 65 - 99 mg/dL   Comment 1 Notify RN    No results found.  Review of Systems  Constitutional: Negative.   HENT: Negative.   Eyes: Negative.   Respiratory: Negative.   Cardiovascular: Negative.   Gastrointestinal: Negative.   Musculoskeletal: Negative.   Skin: Negative.   Neurological: Negative.   Psychiatric/Behavioral: Positive for depression, hallucinations, memory loss and suicidal ideas. Negative for substance abuse. The patient is nervous/anxious and has insomnia.     Blood pressure (!) 139/95, pulse (!) 57, temperature (!) 97.2 F (36.2 C), temperature source Oral, resp. rate 18, height 6' (1.829 m), weight 169 lb (76.7 kg), SpO2 100 %. Physical Exam  Nursing note and vitals reviewed. Constitutional: He appears well-developed and well-nourished.  HENT:  Head: Normocephalic and atraumatic.  Eyes: Pupils are equal, round, and reactive to light. Conjunctivae are normal.  Neck: Normal range of motion.  Cardiovascular: Regular rhythm and normal heart sounds.   Respiratory: Effort normal. No respiratory distress.  GI: Soft.  Musculoskeletal: Normal range of motion.  Neurological: He is alert.  Skin: Skin is warm and  dry.  Psychiatric: His mood appears anxious. His affect is blunt. His speech is delayed. He is slowed and withdrawn. Thought content is paranoid. Cognition and memory are impaired. He expresses impulsivity. He exhibits a depressed mood. He expresses suicidal ideation. He exhibits abnormal recent memory.     Assessment/Plan Continuing ECT along with medication management and therapy management daily assessment and social work assessment and treatment  Mordecai Rasmussen, MD 01/27/2017, 10:55 AM

## 2017-01-27 NOTE — Progress Notes (Addendum)
Patient ID: Tony Cunningham, male   DOB: 11-Jan-1976, 41 y.o.   MRN: [id number] ECT Prep Checklist: 1. Patient is NPO after midnight.  2. Pre-ECT CBG=153  3. Please hold the patient's insulin the morning of the procedure. 4. Oncoming Nursing Staffs will assist the patient with personal hygiene the morning of treatment including brushing his teeth 5.Propanolol 20 mg given Pre-ECT

## 2017-01-27 NOTE — Progress Notes (Signed)
Having flatus

## 2017-01-27 NOTE — Plan of Care (Signed)
Problem: Coping: Goal: Ability to cope will improve Outcome: Progressing Patient denies suicidal ideations.   

## 2017-01-27 NOTE — Anesthesia Post-op Follow-up Note (Signed)
Anesthesia QCDR form completed.        

## 2017-01-27 NOTE — Anesthesia Preprocedure Evaluation (Signed)
Anesthesia Evaluation  Patient identified by MRN, date of birth, ID band Patient awake    Reviewed: Allergy & Precautions, NPO status , Patient's Chart, lab work & pertinent test results  History of Anesthesia Complications Negative for: history of anesthetic complications  Airway Mallampati: II  TM Distance: >3 FB Neck ROM: Full    Dental  (+) Poor Dentition, Chipped   Pulmonary asthma , Current Smoker,    breath sounds clear to auscultation- rhonchi (-) wheezing      Cardiovascular hypertension, Pt. on medications (-) CAD, (-) Past MI and (-) Cardiac Stents  Rhythm:Regular Rate:Normal - Systolic murmurs and - Diastolic murmurs    Neuro/Psych PSYCHIATRIC DISORDERS Anxiety Depression Schizophrenia negative neurological ROS     GI/Hepatic negative GI ROS, Neg liver ROS,   Endo/Other  diabetes, Well Controlled, Type 2, Insulin Dependent, Oral Hypoglycemic Agents  Renal/GU negative Renal ROS     Musculoskeletal negative musculoskeletal ROS (+)   Abdominal (+) - obese,   Peds negative pediatric ROS (+)  Hematology negative hematology ROS (+)   Anesthesia Other Findings Signs and symptoms suggestive of sleep apnea   Past Medical History: No date: Anxiety No date: Asthma No date: Diabetes mellitus No date: High blood pressure No date: Sinus complaint  History reviewed. No pertinent surgical history.   Reproductive/Obstetrics                             Anesthesia Physical  Anesthesia Plan  ASA: III  Anesthesia Plan: General   Post-op Pain Management:    Induction: Intravenous  PONV Risk Score and Plan:   Airway Management Planned: Mask  Additional Equipment:   Intra-op Plan:   Post-operative Plan:   Informed Consent: I have reviewed the patients History and Physical, chart, labs and discussed the procedure including the risks, benefits and alternatives for the proposed  anesthesia with the patient or authorized representative who has indicated his/her understanding and acceptance.   Dental advisory given  Plan Discussed with: CRNA and Anesthesiologist  Anesthesia Plan Comments: (Patient consented for risks of anesthesia including but not limited to:  - adverse reactions to medications - damage to teeth, lips or other oral mucosa - sore throat or hoarseness - Damage to heart, brain, lungs or loss of life  Patient voiced understanding.)        Anesthesia Quick Evaluation

## 2017-01-27 NOTE — Plan of Care (Signed)
Problem: Activity: Goal: Interest or engagement in leisure activities will improve Outcome: Progressing Pt will engage in at least one activity this shift.

## 2017-01-27 NOTE — Anesthesia Postprocedure Evaluation (Signed)
Anesthesia Post Note  Patient: Tony Cunningham  Procedure(s) Performed: * No procedures listed *  Patient location during evaluation: PACU Anesthesia Type: General Level of consciousness: awake and alert and oriented Pain management: pain level controlled Vital Signs Assessment: post-procedure vital signs reviewed and stable Respiratory status: spontaneous breathing Cardiovascular status: blood pressure returned to baseline Anesthetic complications: no     Last Vitals:  Vitals:   01/27/17 1142 01/27/17 1144  BP:  122/88  Pulse: 79 69  Resp: 13 11  Temp:    SpO2: 98% 98%    Last Pain:  Vitals:   01/27/17 1133  TempSrc:   PainSc: 0-No pain                 [name]

## 2017-01-27 NOTE — Progress Notes (Signed)
Rosey Bath in to see pt  Pt alert but does not answer questions without prompts

## 2017-01-28 ENCOUNTER — Other Ambulatory Visit: Payer: Self-pay | Admitting: Psychiatry

## 2017-01-28 LAB — GLUCOSE, CAPILLARY
GLUCOSE-CAPILLARY: 235 mg/dL — AB (ref 65–99)
GLUCOSE-CAPILLARY: 175 mg/dL — AB (ref 65–99)
GLUCOSE-CAPILLARY: 222 mg/dL — AB (ref 65–99)
Glucose-Capillary: 364 mg/dL — ABNORMAL HIGH (ref 65–99)
Glucose-Capillary: 166 mg/dL — ABNORMAL HIGH (ref 65–99)
Glucose-Capillary: 368 mg/dL — ABNORMAL HIGH (ref 65–99)

## 2017-01-28 LAB — CLOZAPINE (CLOZARIL)
CLOZAPINE LVL: 68 ng/mL — AB (ref 350–650)
NorClozapine: 52 ng/mL
Total(Cloz+Norcloz): 120 ng/mL

## 2017-01-28 MED ORDER — HALOPERIDOL LACTATE 5 MG/ML IJ SOLN: 5.0000 mg | Freq: Four times a day (QID) | INTRAMUSCULAR | Status: DC | PRN

## 2017-01-28 MED ORDER — HALOPERIDOL LACTATE 5 MG/ML IJ SOLN: 5.0000 mg | Freq: Once | INTRAMUSCULAR | Status: DC

## 2017-01-28 MED ORDER — MIDAZOLAM HCL 2 MG/2ML IJ SOLN
4.0000 mg | Freq: Once | INTRAMUSCULAR | Status: AC
  Administered 2017-02-01: 4 mg via INTRAVENOUS

## 2017-01-28 MED ORDER — MIDAZOLAM HCL 2 MG/2ML IJ SOLN: 2.0000 mg | Freq: Once | INTRAMUSCULAR | Status: DC

## 2017-01-28 MED ORDER — SODIUM CHLORIDE 0.9 % IV SOLN
500.0000 mL | Freq: Once | INTRAVENOUS | Status: AC
  Administered 2017-01-29: 09:00:00 via INTRAVENOUS

## 2017-01-28 MED ORDER — MIDAZOLAM HCL 2 MG/2ML IJ SOLN: 4.0000 mg | Freq: Once | INTRAMUSCULAR | Status: DC

## 2017-01-28 MED ORDER — ONDANSETRON HCL 4 MG/2ML IJ SOLN
4.0000 mg | Freq: Once | INTRAMUSCULAR | Status: AC | PRN
  Administered 2017-01-29: 4 mg via INTRAVENOUS

## 2017-01-28 MED ORDER — SODIUM CHLORIDE 0.9 % IV SOLN: 500.0000 mL | Freq: Once | INTRAVENOUS | Status: DC

## 2017-01-28 MED ORDER — INSULIN ASPART 100 UNIT/ML ~~LOC~~ SOLN
5.0000 [IU] | Freq: Three times a day (TID) | SUBCUTANEOUS | Status: DC
  Administered 2017-01-29 – 2017-02-10 (×29): 5 [IU] via SUBCUTANEOUS
  Filled 2017-01-28 (×13): qty 1

## 2017-01-28 MED ORDER — HALOPERIDOL LACTATE 5 MG/ML IJ SOLN
5.0000 mg | Freq: Once | INTRAMUSCULAR | Status: AC
  Administered 2017-01-29: 5 mg via INTRAVENOUS

## 2017-01-28 MED ORDER — KETOROLAC TROMETHAMINE 30 MG/ML IJ SOLN: 30.0000 mg | Freq: Once | INTRAMUSCULAR | Status: AC

## 2017-01-28 NOTE — Plan of Care (Signed)
Problem: Activity: Goal: Interest or engagement in leisure activities will improve Outcome: Not Progressing Patient does not engage in leisure activities  Goal: Imbalance in normal sleep/wake cycle will improve Outcome: Not Progressing Patient is observed sleeping often during the day hours  Problem: Education: Goal: Knowledge of the prescribed therapeutic regimen will improve Outcome: Not Progressing Patient does not show interest in his prescribed medication regimen  Problem: Coping: Goal: Ability to verbalize feelings will improve Outcome: Progressing Patient verbalizes how he feels to staff.  Problem: Health Behavior/Discharge Planning: Goal: Ability to make decisions will improve Outcome: Not Progressing Patient does not show the ability to make decisions regarding his care Goal: Compliance with therapeutic regimen will improve Outcome: Progressing Patient is compliant with regimen.

## 2017-01-28 NOTE — [facility] Suicide Risk Assessment (Addendum)
[facility] INPATIENT:  Family/Significant Other Suicide Prevention Education  Suicide Prevention Education:  Contact Attempts: Estrella Deeds, mother, [phone], has been identified by the patient as the family member/significant other with whom the patient will be residing, and identified as the person(s) who will aid the patient in the event of a mental health crisis.  With written consent from the patient, two attempts were made to provide suicide prevention education, prior to and/or following the patient's discharge.  We were unsuccessful in providing suicide prevention education.  A suicide education pamphlet was given to the patient to share with family/significant other.  Date and time of first attempt:01/28/17, 1520 Date and time of second attempt: 01/29/17, 0848  [name], LCSW 01/28/2017, 3:20 PM

## 2017-01-28 NOTE — Progress Notes (Signed)
CSW spoke with Nelma Rothman, care coordinator at [facility].  [phone].  She agreed to staff case with her supervisor to consider next options for placement for this pt. Garner Nash, MSW, LCSW Clinical Social Worker 01/28/2017 2:24 PM

## 2017-01-28 NOTE — [facility] Group Notes (Signed)
[facility] LCSW Group Therapy Note  Date/Time  September 6th,2018 9:30- to 10:15  Type of Therapy/Topic:  Group Therapy:  Balance in Life  Participation Level: Patient  was invited to attend therapuetic group and chose not to attend  Tony Haag LCSW 

## 2017-01-28 NOTE — Progress Notes (Signed)
Patient endorses auditory hallucinations, states, "I don't feel like I want to hurt myself but I can hear some whispering in my head." Patient observed lying in bed with eyes closed majority of the shift. Compliant with medications and meals. Blood sugar monitored and coverage administered per MD orders. Will continue to monitor patient and MD notified of any acute changes.

## 2017-01-28 NOTE — Progress Notes (Signed)
BDaily contact with patient to assess and evaluate symptoms and progress in treatment, Medication management and Plan [facility] MD Progress Note  01/28/2017 5:00 PM Tony Cunningham  MRN:  [id number] Subjective:  41 year old man with a history of schizoaffective disorder who came back into the hospital only shortly after his most recent discharge this time reporting hallucinations and suicidal ideation. On evaluation today patient is in bed staying withdrawn and hardly ever attending groups. He tells me his mood is very sad and feels frightened. He says he's having auditory hallucinations and active wishes that he were dead. He is not acting on any of these and is not aggressive. Patient has been compliant with medication. He admits to me that he really would rather stay in the hospital forever and does not really ever want to be placed in a group home or any other kind of even semi-independent living situation.  Follow-up for Thursday the 30th. Patient seen chart reviewed. This 41 year old man with chronic severe mental health problems and failure to transition to outpatient care is once again complaining of hallucinations. He is withdrawn most of the time. Minimal interaction with other people. Denies active suicidal thoughts. Hardly ever gets around other people though. Tolerating medicines well. His blood sugars are reasonably stable although he once again had an over 300 reading this evening.  Follow-up for Friday the 31st. Patient had ECT bilateral treatment this morning which was well tolerated. Seen this afternoon in treatment team he had no new complaint. Continues to describe himself as very depressed. Continues to report suicidal thoughts and hallucinations. Rarely gets out of his bed and attends almost no groups or therapeutic activities. Blood sugars remained somewhat labile with an elevated sugar this morning.  01/23/17 - patient reports that he continues to have monitoring hallucinations which are  troublesome and distressing. The voices make him feel suicidal. Spent most of the day in bed, saw him walking around in the evening. Denies active plan but does endorse that he has transient thoughts of suicide.  01/24/17 - patient reports that he feels "so-so". Continues to have monitoring hallucinations although not as severe as yesterday. Endorses that he had a good night's rest. I tried to clarify the dosing and duration of clozapine from him but he states that he does not know.  Follow-up for September 3. Patient still lying in bed all day. He tells me that he still hears voices but they are "less". He can't really quantify that. Denies active suicidal thoughts. Admits that he worries constantly about how he will function outside of the hospital. Chart reviewed. I note that the last clozapine level that came back was in the middle of last month and was well into the therapeutic range.  All up for September 5. Patient seen. Chart reviewed. He had ECT today which went well as usual. He continues to complain of feeling confused and of feeling very anxious. Says he has racing thoughts and suicidal thoughts. Stays very withdrawn in his bed.  Follow-up for Thursday the sixth. Patient remains bedbound almost constantly. He speaks very little. Continues to report having "racing thoughts" and feeling frightened. Affect flat and blunted. Unable to articulate much more detail than that. Poor self-care although it looks like his sugars are starting to creep up.  Principal Problem: Schizoaffective disorder, depressive type (HCC) Diagnosis:   Patient Active Problem List   Diagnosis Date Noted  . 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 Schizoaffective disorder, depressive type (HCC) [F25.1] 09/23/2016  . Polysubstance  abuse [F19.10] 07/17/2016  . Tardive dyskinesia [G24.01] 07/16/2016  . Tobacco use disorder [F17.200] 07/07/2016  . Dyslipidemia [E78.5] 07/07/2016  . Asthma [J45.909]  07/07/2016  . HTN (hypertension) [I10] 07/06/2016  . Diabetes (HCC) [E11.9] 12/25/2010   Total Time spent with patient: 30 minutes  Past Psychiatric History: Patient has a long-standing history of mental illness but over the last year or so seems to of decompensated more than usual and has had one hospitalization after another. He did show some response to ECT and antipsychotic medication but seems to decompensate immediately whenever he is discharged.  Past Medical History:  Past Medical History:  Diagnosis Date  . Anxiety   . Asthma   . Diabetes mellitus   . High blood pressure   . Sinus complaint    History reviewed. No pertinent surgical history. Family History: History reviewed. No pertinent family history. Family Psychiatric  History: Unknown Social History:  History  Alcohol Use No     History  Drug Use No    Social History   Social History  . Marital status: Single    Spouse name: [name]  . Number of children: [name]  . Years of education: [name]   Social History Main Topics  . Smoking status: Current Every Day Smoker    Packs/day: 0.50    Types: Cigarettes  . Smokeless tobacco: Never Used  . Alcohol use No  . Drug use: No  . Sexual activity: Not Currently   Other Topics Concern  . None   Social History Narrative  . None   Additional Social History:                         Sleep: Fair  Appetite:  Poor  Current Medications: Current Facility-Administered Medications  Medication Dose Route Frequency Provider Last Rate Last Dose  . 0.9 %  sodium chloride infusion  500 mL Intravenous Once Finnlee Silvernail T, MD      . acetaminophen (TYLENOL) tablet 650 mg  650 mg Oral Q6H PRN Eli Adami, [name], MD   650 mg at 01/22/17 1247  . alum & mag hydroxide-simeth (MAALOX/MYLANTA) 200-200-20 MG/5ML suspension 30 mL  30 mL Oral Q4H PRN Gabriele Loveland T, MD      . cloZAPine (CLOZARIL) tablet 150 mg  150 mg Oral QHS Montavis Schubring, [name], MD   150 mg at 01/27/17 2120  .  FLUoxetine (PROZAC) capsule 40 mg  40 mg Oral Daily Aniyah Nobis T, MD   40 mg at 01/28/17 0802  . haloperidol lactate (HALDOL) injection 5 mg  5 mg Intravenous Once Tannen Vandezande T, MD      . insulin aspart (novoLOG) injection 0-5 Units  0-5 Units Subcutaneous QHS Annaleigha Woo, [name], MD   4 Units at 01/21/17 2229  . insulin aspart (novoLOG) injection 0-9 Units  0-9 Units Subcutaneous TID WC Parnika Tweten, [name], MD   9 Units at 01/28/17 1619  . [START ON 01/29/2017] insulin aspart (novoLOG) injection 5 Units  5 Units Subcutaneous TID WC Sharica Roedel T, MD      . insulin glargine (LANTUS) injection 35 Units  35 Units Subcutaneous QHS Starleen Trussell, [name], MD   35 Units at 01/27/17 2120  . ketorolac (TORADOL) 30 MG/ML injection 30 mg  30 mg Intravenous Once Onur Mori T, MD      . lisinopril (PRINIVIL,ZESTRIL) tablet 20 mg  20 mg Oral Daily Varnell Donate, [name], MD   20 mg at 01/28/17 0802  .  magnesium hydroxide (MILK OF MAGNESIA) suspension 30 mL  30 mL Oral Daily PRN Hernandez Losasso T, MD      . metFORMIN (GLUCOPHAGE) tablet 1,000 mg  1,000 mg Oral BID WC Antino Mayabb T, MD   1,000 mg at 01/28/17 1620  . midazolam (VERSED) injection 2 mg  2 mg Intravenous Once Zaiah Credeur T, MD      . midazolam (VERSED) injection 4 mg  4 mg Intravenous Once Hayli Milligan T, MD      . montelukast (SINGULAIR) tablet 10 mg  10 mg Oral QHS Dovey Fatzinger, [name], MD   10 mg at 01/27/17 2120  . nicotine (NICODERM CQ - dosed in mg/24 hours) patch 21 mg  21 mg Transdermal Daily [name], [name], MD      . ondansetron (ZOFRAN) injection 4 mg  4 mg Intravenous Once PRN Naomie Dean, MD      . pantoprazole (PROTONIX) EC tablet 40 mg  40 mg Oral Daily Gwendy Boeder, [name], MD   40 mg at 01/28/17 0802  . propranolol (INDERAL) tablet 20 mg  20 mg Oral BID Lessie Funderburke T, MD   20 mg at 01/28/17 1620    Lab Results:  Results for orders placed or performed during the hospital encounter of 01/19/17 (from the past 48 hour(s))  Glucose,  capillary     Status: Abnormal   Collection Time: 01/26/17  8:33 PM  Result Value Ref Range   Glucose-Capillary 183 (H) 65 - 99 mg/dL   Comment 1 Notify RN   Glucose, capillary     Status: Abnormal   Collection Time: 01/27/17  6:23 AM  Result Value Ref Range   Glucose-Capillary 153 (H) 65 - 99 mg/dL   Comment 1 Notify RN   Glucose, capillary     Status: Abnormal   Collection Time: 01/27/17  4:36 PM  Result Value Ref Range   Glucose-Capillary 235 (H) 65 - 99 mg/dL  Glucose, capillary     Status: Abnormal   Collection Time: 01/27/17  9:02 PM  Result Value Ref Range   Glucose-Capillary 175 (H) 65 - 99 mg/dL  Glucose, capillary     Status: Abnormal   Collection Time: 01/28/17  7:05 AM  Result Value Ref Range   Glucose-Capillary 364 (H) 65 - 99 mg/dL  Glucose, capillary     Status: Abnormal   Collection Time: 01/28/17 11:24 AM  Result Value Ref Range   Glucose-Capillary 166 (H) 65 - 99 mg/dL   Comment 1 Notify RN   Glucose, capillary     Status: Abnormal   Collection Time: 01/28/17  4:08 PM  Result Value Ref Range   Glucose-Capillary 368 (H) 65 - 99 mg/dL   Comment 1 Notify RN     Blood Alcohol level:  Lab Results  Component Value Date   ETH <5 01/16/2017   ETH <5 12/21/2016    Metabolic Disorder Labs: Lab Results  Component Value Date   HGBA1C 7.6 (H) 01/19/2017   MPG 171.42 01/19/2017   MPG 249 11/09/2016   Lab Results  Component Value Date   PROLACTIN 24.5 (H) 09/24/2016   PROLACTIN 3.4 (L) 07/07/2016   Lab Results  Component Value Date   CHOL 122 01/19/2017   TRIG 154 (H) 01/19/2017   HDL 36 (L) 01/19/2017   CHOLHDL 3.4 01/19/2017   VLDL 31 01/19/2017   LDLCALC 55 01/19/2017   LDLCALC 59 12/28/2016    Physical Findings: AIMS: Facial and Oral Movements Muscles of Facial  Expression: None, normal Lips and Perioral Area: None, normal Jaw: None, normal Tongue: None, normal,Extremity Movements Upper (arms, wrists, hands, fingers): Mild Lower (legs,  knees, ankles, toes): None, normal, Trunk Movements Neck, shoulders, hips: None, normal, Overall Severity Severity of abnormal movements (highest score from questions above): Minimal Incapacitation due to abnormal movements: None, normal Patient's awareness of abnormal movements (rate only patient's report): No Awareness, Dental Status Current problems with teeth and/or dentures?: No Does patient usually wear dentures?: No  CIWA:    COWS:  COWS Total Score: 1  Musculoskeletal: Strength & Muscle Tone: decreased Gait & Station: normal Patient leans: [name]  Psychiatric Specialty Exam: Physical Exam  Nursing note and vitals reviewed. Constitutional: He appears well-developed and well-nourished.  HENT:  Head: Normocephalic and atraumatic.  Eyes: Pupils are equal, round, and reactive to light. Conjunctivae are normal.  Neck: Normal range of motion.  Cardiovascular: Regular rhythm and normal heart sounds.   Respiratory: Effort normal and breath sounds normal. No respiratory distress.  GI: Soft.  Musculoskeletal: Normal range of motion.  Neurological: He is alert.  Skin: Skin is warm and dry.  Psychiatric: His affect is blunt. His speech is delayed. He is slowed and withdrawn. Thought content is paranoid. Cognition and memory are impaired. He expresses impulsivity. He expresses suicidal ideation. He expresses no homicidal ideation.    Review of Systems  Constitutional: Negative.   HENT: Negative.   Eyes: Negative.   Respiratory: Negative.   Cardiovascular: Negative.   Gastrointestinal: Negative.   Musculoskeletal: Negative.   Skin: Negative.   Neurological: Negative.   Psychiatric/Behavioral: Positive for depression, hallucinations, memory loss and suicidal ideas. Negative for substance abuse. The patient is nervous/anxious and has insomnia.     Blood pressure 122/72, pulse 87, temperature 99 F (37.2 C), temperature source Oral, resp. rate 17, height 6' (1.829 m), weight 78.5 kg  (173 lb), SpO2 98 %.Body mass index is 23.46 kg/m.  General Appearance: Disheveled  Eye Contact:  Fair  Speech:  Slow  Volume:  Decreased  Mood:  Depressed and Dysphoric  Affect:  Constricted and Depressed  Thought Process:  Disorganized  Orientation:  Full (Time, Place, and Person)  Thought Content:  Illogical, Hallucinations: Auditory and Paranoid Ideation  Suicidal Thoughts:  Yes.  without intent/plan  Homicidal Thoughts:  No  Memory:  Immediate;   Good Recent;   Fair Remote;   Fair  Judgement:  Impaired  Insight:  Shallow  Psychomotor Activity:  Decreased  Concentration:  Concentration: Poor  Recall:  Poor  Fund of Knowledge:  Poor  Language:  Fair  Akathisia:  No  Handed:  Right  AIMS (if indicated):     Assets:  Desire for Improvement  ADL's:  Impaired  Cognition:  Impaired,  Mild  Sleep:  Number of Hours: 6.3   Patient was schizoaffective disorder getting ECT and also medication stabilization. No real reason I don't think to increase the clozapine. I see that Zyprexa was added in the last day or so but I doubt that is going to be helpful and may make his blood sugar problem worse. We don't need anything to make him more sedated. I'm going to hold that any increase the dose of Celexa to 40 mg a day. As always encouraged him to get up out of bed and be more active. #Schizoaffective disorder, SI - Continue ECT - Next one on 01/27/17 - Continue clozapine 150 mg QHS, aim to optimize dose of antipsychotic monotherapy. Level pending and will hold at  150 mg.  -  -   #Smoking cessation Continue nicotine patch 21 mg/24 hours Continue propranolol 20 mg BID  #DM Continue metformin 1000 mg BIDCC Insulin sliding scale  #GERD Continue protonix EC 40 mg daily.    Lipid Panel     Component Value Date/Time   CHOL 122 01/19/2017 0637   TRIG 154 (H) 01/19/2017 0637   HDL 36 (L) 01/19/2017 0637   CHOLHDL 3.4 01/19/2017 0637   VLDL 31 01/19/2017 0637   LDLCALC 55 01/19/2017  0637  Difficult situation. Patient showing little improvement. Clozapine dose appears to be adequate. Prozac dose appears to be tolerated. ECT tolerated. Still patient continues to report severe anxiety depression and suicidal thoughts and hallucinations. I'm going to slightly increase his 3 times a day short acting insulin because of his blood sugars being up again. Otherwise no change today to medication continue ECT. Case reviewed with treatment team. Recommendation is that we consider long-acting hospitalization  Mordecai Rasmussen, MD 01/28/2017, 5:00 PM

## 2017-01-28 NOTE — [facility] Group Notes (Signed)
Goals Group Date/Time:September 6th,2018 9-9:30 Type of Therapy and Topic: Group Therapy: Goals Group: SMART Goals   Participation Level: Patient did not participate patient was remined and was invited to attend group by nurse tech.   Dartanyan Deasis LCSW   

## 2017-01-28 NOTE — [facility] Group Notes (Signed)
[facility] Group Notes:  (Nursing/MHT/Case Management/Adjunct)  Date:  01/28/2017  Time:  9:14 PM  Type of Therapy:  Group Therapy  Participation Level:  Active  Participation Quality:  Appropriate  Affect:  Appropriate  Cognitive:  Appropriate  Insight:  Good  Engagement in Group:  Engaged  Modes of Intervention:  Support  Summary of Progress/Problems:  Tony Cunningham 01/28/2017, 9:14 PM

## 2017-01-28 NOTE — Progress Notes (Signed)
0145- All of pt's ECT orders were accidentally released by Clinical research associate. Notified the charge nurse of error.

## 2017-01-28 NOTE — [facility] Group Notes (Signed)
[facility] Group Notes:  (Nursing/MHT/Case Management/Adjunct)  Date:  01/28/2017  Time:  1:55 PM  Type of Therapy:  Psychoeducational Skills  Participation Level:  Did Not Attend   Judene Companion 01/28/2017, 1:55 PM

## 2017-01-29 ENCOUNTER — Inpatient Hospital Stay: Payer: Medicare Other | Admitting: Anesthesiology

## 2017-01-29 ENCOUNTER — Inpatient Hospital Stay (HOSPITAL_COMMUNITY)
Admission: AD | Admit: 2017-01-29 | Discharge: 2017-01-29 | Disposition: A | Discharge status: Home / Self Care | Payer: Medicare Other | Attending: Psychiatry | Admitting: Psychiatry

## 2017-01-29 DIAGNOSIS — F333 Major depressive disorder, recurrent, severe with psychotic symptoms: Secondary | ICD-10-CM

## 2017-01-29 LAB — CBC WITH DIFFERENTIAL/PLATELET
Basophils Absolute: 0.1 10*3/uL (ref 0–0.1)
Basophils Relative: 1 %
Eosinophils Absolute: 0.3 10*3/uL (ref 0–0.7)
Eosinophils Relative: 4 %
HCT: 40.5 % (ref 40.0–52.0)
Hemoglobin: 14.3 g/dL (ref 13.0–18.0)
Lymphocytes Relative: 47 %
Lymphs Abs: 2.9 10*3/uL (ref 1.0–3.6)
MCH: 30.9 pg (ref 26.0–34.0)
MCHC: 35.4 g/dL (ref 32.0–36.0)
MCV: 87.3 fL (ref 80.0–100.0)
Monocytes Absolute: 0.4 10*3/uL (ref 0.2–1.0)
Monocytes Relative: 7 %
Neutro Abs: 2.5 10*3/uL (ref 1.4–6.5)
Neutrophils Relative %: 41 %
Platelets: 172 10*3/uL (ref 150–440)
RBC: 4.64 MIL/uL (ref 4.40–5.90)
RDW: 12.8 % (ref 11.5–14.5)
WBC: 6.2 10*3/uL (ref 3.8–10.6)

## 2017-01-29 LAB — GLUCOSE, CAPILLARY
GLUCOSE-CAPILLARY: 265 mg/dL — AB (ref 65–99)
GLUCOSE-CAPILLARY: 208 mg/dL — AB (ref 65–99)
Glucose-Capillary: 252 mg/dL — ABNORMAL HIGH (ref 65–99)

## 2017-01-29 MED ORDER — MIDAZOLAM HCL 2 MG/2ML IJ SOLN
INTRAMUSCULAR | Status: AC
  Filled 2017-01-29: qty 4

## 2017-01-29 MED ORDER — SUCCINYLCHOLINE CHLORIDE 20 MG/ML IJ SOLN
INTRAMUSCULAR | Status: AC
  Filled 2017-01-29: qty 1

## 2017-01-29 MED ORDER — ONDANSETRON HCL 4 MG/2ML IJ SOLN
INTRAMUSCULAR | Status: AC
Start: 2017-01-29 — End: 2017-01-29
  Filled 2017-01-29: qty 2

## 2017-01-29 MED ORDER — SUCCINYLCHOLINE CHLORIDE 200 MG/10ML IV SOSY
PREFILLED_SYRINGE | INTRAVENOUS | Status: DC | PRN
  Administered 2017-01-29: 100 mg via INTRAVENOUS

## 2017-01-29 MED ORDER — SODIUM CHLORIDE 0.9 % IV SOLN
500.0000 mL | Freq: Once | INTRAVENOUS | Status: AC
  Administered 2017-01-29: 10:00:00 via INTRAVENOUS

## 2017-01-29 MED ORDER — MIDAZOLAM HCL 2 MG/2ML IJ SOLN
4.0000 mg | Freq: Once | INTRAMUSCULAR | Status: AC
  Administered 2017-01-29: 4 mg via INTRAVENOUS

## 2017-01-29 MED ORDER — METHOHEXITAL SODIUM 100 MG/10ML IV SOSY
PREFILLED_SYRINGE | INTRAVENOUS | Status: DC | PRN
  Administered 2017-01-29: 80 mg via INTRAVENOUS

## 2017-01-29 MED ORDER — HALOPERIDOL LACTATE 5 MG/ML IJ SOLN
INTRAMUSCULAR | Status: AC
  Filled 2017-01-29: qty 1

## 2017-01-29 MED ORDER — METHOHEXITAL SODIUM 0.5 G IJ SOLR
INTRAMUSCULAR | Status: AC
  Filled 2017-01-29: qty 500

## 2017-01-29 NOTE — Plan of Care (Signed)
Problem: Education: Goal: Knowledge of the prescribed therapeutic regimen will improve Outcome: Progressing Received Sully this am before he was transported to the PACU for his ECT. He returned and was alert and alerted x4. He was compliant with his medication. He denied all of the psychiatric symptoms except depression. He was encouraged to attend the afternoon group sessions and received a small smile.

## 2017-01-29 NOTE — Plan of Care (Addendum)
Problem: Activity: Goal: Sleeping patterns will improve Outcome: Progressing Patient slept for Estimated Hours of 6; Precautionary checks every 15 minutes for safety maintained, room free of safety hazards, patient sustains no injury or falls during this shift.  ECT Prep Checklist: 1. Patient is NPO after midnight.  2. Pre- ECT CBG=252 3. Insulin Coverage Held per order for ECT procedure. 4. Oncoming nursing staffs will help patient with personal hygiene the morning of treatment including brushing his teeth 5. Pre-ECT medication, Propanolol 20 mg given.

## 2017-01-29 NOTE — Procedures (Signed)
ECT SERVICES Physician's Interval Evaluation & Treatment Note  Patient Identification: Tony Cunningham MRN:  [id number] Date of Evaluation:  01/29/2017 TX #: 10  MADRS: 24  MMSE: 26  P.E. Findings:  No change to physical exam. Vitals. Heart and lungs normal.  Psychiatric Interval Note:  Continues to be anxious and withdrawn although he is a little more interactive today.  Subjective:  Patient is a 41 y.o. male seen for evaluation for Electroconvulsive Therapy. No specific new complaint  Treatment Summary:   []   Right Unilateral             [x]  Bilateral   % Energy : 1.0 ms 60%   Impedance: 1190 ohms  Seizure Energy Index: 8303 V squared  Postictal Suppression Index: 76%  Seizure Concordance Index: 98%  Medications  Pre Shock: Brevital 80 mg succinylcholine 100 mg  Post Shock: Versed 4 mg Haldol 5 mg  Seizure Duration: 45 seconds EMG, 59 seconds EEG   Comments: Follow-up Monday   Lungs:  [x]   Clear to auscultation               []  Other:   Heart:    [x]   Regular rhythm             []  irregular rhythm    [x]   Previous H&P reviewed, patient examined and there are NO CHANGES                 []   Previous H&P reviewed, patient examined and there are changes noted.   , MD 9/7/201810:23 AM

## 2017-01-29 NOTE — Progress Notes (Signed)
Patient ID: Tony Cunningham, male   DOB: 1976/01/04, 41 y.o.   MRN: [id number] Disheveled, rigid-walking with fits, continues to endorse CA[facility] telling him to hurt himself, visible in the milieu, less isolative to room, attended Wrap-Up group; prep for ECT: NPO after MN, will give Propanolol 20 mg and obtain Pre-CBG.

## 2017-01-29 NOTE — Progress Notes (Signed)
BDaily contact with patient to assess and evaluate symptoms and progress in treatment, Medication management and Plan [facility] MD Progress Note  01/29/2017 3:07 PM Tony Cunningham  MRN:  [id number] Subjective:  41 year old man with a history of schizoaffective disorder who came back into the hospital only shortly after his most recent discharge this time reporting hallucinations and suicidal ideation. On evaluation today patient is in bed staying withdrawn and hardly ever attending groups. He tells me his mood is very sad and feels frightened. He says he's having auditory hallucinations and active wishes that he were dead. He is not acting on any of these and is not aggressive. Patient has been compliant with medication. He admits to me that he really would rather stay in the hospital forever and does not really ever want to be placed in a group home or any other kind of even semi-independent living situation.  Follow-up for Thursday the 30th. Patient seen chart reviewed. This 41 year old man with chronic severe mental health problems and failure to transition to outpatient care is once again complaining of hallucinations. He is withdrawn most of the time. Minimal interaction with other people. Denies active suicidal thoughts. Hardly ever gets around other people though. Tolerating medicines well. His blood sugars are reasonably stable although he once again had an over 300 reading this evening.  Follow-up for Friday the 31st. Patient had ECT bilateral treatment this morning which was well tolerated. Seen this afternoon in treatment team he had no new complaint. Continues to describe himself as very depressed. Continues to report suicidal thoughts and hallucinations. Rarely gets out of his bed and attends almost no groups or therapeutic activities. Blood sugars remained somewhat labile with an elevated sugar this morning.  01/23/17 - patient reports that he continues to have monitoring hallucinations which are  troublesome and distressing. The voices make him feel suicidal. Spent most of the day in bed, saw him walking around in the evening. Denies active plan but does endorse that he has transient thoughts of suicide.  01/24/17 - patient reports that he feels "so-so". Continues to have monitoring hallucinations although not as severe as yesterday. Endorses that he had a good night's rest. I tried to clarify the dosing and duration of clozapine from him but he states that he does not know.  Follow-up for September 3. Patient still lying in bed all day. He tells me that he still hears voices but they are "less". He can't really quantify that. Denies active suicidal thoughts. Admits that he worries constantly about how he will function outside of the hospital. Chart reviewed. I note that the last clozapine level that came back was in the middle of last month and was well into the therapeutic range.  All up for September 5. Patient seen. Chart reviewed. He had ECT today which went well as usual. He continues to complain of feeling confused and of feeling very anxious. Says he has racing thoughts and suicidal thoughts. Stays very withdrawn in his bed.  Follow-up for Thursday the sixth. Patient remains bedbound almost constantly. He speaks very little. Continues to report having "racing thoughts" and feeling frightened. Affect flat and blunted. Unable to articulate much more detail than that. Poor self-care although it looks like his sugars are starting to creep up.  Follow-up note for Friday the seventh. Patient had ECT treatment again today which as usual was tolerated without difficulty. Patient continues to report that he feels sad and frightened and anxious. He says he still has some suicidal  thoughts but has no current plan to act on it. He says the hallucinations are getting a little better. Although he only has partial insight into it staff have noticed that he is a little bit more interactive and his affect a  little more appropriate. His hygiene has improved. Blood sugars remain labile.  Principal Problem: Schizoaffective disorder, depressive type (HCC) Diagnosis:   Patient Active Problem List   Diagnosis Date Noted  . 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 Schizoaffective disorder, depressive type (HCC) [F25.1] 09/23/2016  . Polysubstance abuse [F19.10] 07/17/2016  . Tardive dyskinesia [G24.01] 07/16/2016  . Tobacco use disorder [F17.200] 07/07/2016  . Dyslipidemia [E78.5] 07/07/2016  . Asthma [J45.909] 07/07/2016  . HTN (hypertension) [I10] 07/06/2016  . Diabetes (HCC) [E11.9] 12/25/2010   Total Time spent with patient: 30 minutes  Past Psychiatric History: Patient has a long-standing history of mental illness but over the last year or so seems to of decompensated more than usual and has had one hospitalization after another. He did show some response to ECT and antipsychotic medication but seems to decompensate immediately whenever he is discharged.  Past Medical History:  Past Medical History:  Diagnosis Date  . Anxiety   . Asthma   . Diabetes mellitus   . High blood pressure   . Sinus complaint    History reviewed. No pertinent surgical history. Family History: History reviewed. No pertinent family history. Family Psychiatric  History: Unknown Social History:  History  Alcohol Use No     History  Drug Use No    Social History   Social History  . Marital status: Single    Spouse name: [name]  . Number of children: [name]  . Years of education: [name]   Social History Main Topics  . Smoking status: Current Every Day Smoker    Packs/day: 0.50    Types: Cigarettes  . Smokeless tobacco: Never Used  . Alcohol use No  . Drug use: No  . Sexual activity: Not Currently   Other Topics Concern  . None   Social History Narrative  . None   Additional Social History:                         Sleep: Fair  Appetite:  Poor  Current  Medications: Current Facility-Administered Medications  Medication Dose Route Frequency Provider Last Rate Last Dose  . acetaminophen (TYLENOL) tablet 650 mg  650 mg Oral Q6H PRN Riane Rung, [name], MD   650 mg at 01/22/17 1247  . alum & mag hydroxide-simeth (MAALOX/MYLANTA) 200-200-20 MG/5ML suspension 30 mL  30 mL Oral Q4H PRN Gayna Braddy T, MD      . cloZAPine (CLOZARIL) tablet 150 mg  150 mg Oral QHS Vanessa Alesi, [name], MD   150 mg at 01/28/17 2128  . FLUoxetine (PROZAC) capsule 40 mg  40 mg Oral Daily Karleigh Bunte T, MD   40 mg at 01/29/17 1145  . insulin aspart (novoLOG) injection 0-5 Units  0-5 Units Subcutaneous QHS Elliett Guarisco, [name], MD   2 Units at 01/28/17 2130  . insulin aspart (novoLOG) injection 0-9 Units  0-9 Units Subcutaneous TID WC Ligia Duguay, [name], MD   2 Units at 01/29/17 1147  . insulin aspart (novoLOG) injection 5 Units  5 Units Subcutaneous TID WC Mahaila Tischer, [name], MD   5 Units at 01/29/17 1146  . insulin glargine (LANTUS) injection 35 Units  35 Units Subcutaneous QHS Larkin Alfred, [name], MD  35 Units at 01/28/17 2129  . ketorolac (TORADOL) 30 MG/ML injection 30 mg  30 mg Intravenous Once Kynlee Koenigsberg T, MD      . lisinopril (PRINIVIL,ZESTRIL) tablet 20 mg  20 mg Oral Daily Leyli Kevorkian, [name], MD   20 mg at 01/29/17 0741  . magnesium hydroxide (MILK OF MAGNESIA) suspension 30 mL  30 mL Oral Daily PRN Allyana Vogan T, MD      . metFORMIN (GLUCOPHAGE) tablet 1,000 mg  1,000 mg Oral BID WC Kiley Solimine T, MD   1,000 mg at 01/29/17 1144  . midazolam (VERSED) injection 2 mg  2 mg Intravenous Once Everard Interrante T, MD      . midazolam (VERSED) injection 4 mg  4 mg Intravenous Once Caren Garske T, MD      . montelukast (SINGULAIR) tablet 10 mg  10 mg Oral QHS Zenaya Ulatowski, [name], MD   10 mg at 01/28/17 2128  . nicotine (NICODERM CQ - dosed in mg/24 hours) patch 21 mg  21 mg Transdermal Daily [name], [name], MD   21 mg at 01/29/17 1145  . pantoprazole (PROTONIX) EC tablet 40 mg  40 mg Oral  Daily Shanterria Franta T, MD   40 mg at 01/29/17 1145  . propranolol (INDERAL) tablet 20 mg  20 mg Oral BID Kyliana Standen, [name], MD   20 mg at 01/29/17 9357   Facility-Administered Medications Ordered in Other Encounters  Medication Dose Route Frequency Provider Last Rate Last Dose  . ondansetron [facility]) 4 MG/2ML injection             Lab Results:  Results for orders placed or performed during the hospital encounter of 01/19/17 (from the past 48 hour(s))  Glucose, capillary     Status: Abnormal   Collection Time: 01/27/17  4:36 PM  Result Value Ref Range   Glucose-Capillary 235 (H) 65 - 99 mg/dL  Glucose, capillary     Status: Abnormal   Collection Time: 01/27/17  9:02 PM  Result Value Ref Range   Glucose-Capillary 175 (H) 65 - 99 mg/dL  Glucose, capillary     Status: Abnormal   Collection Time: 01/28/17  7:05 AM  Result Value Ref Range   Glucose-Capillary 364 (H) 65 - 99 mg/dL  Glucose, capillary     Status: Abnormal   Collection Time: 01/28/17 11:24 AM  Result Value Ref Range   Glucose-Capillary 166 (H) 65 - 99 mg/dL   Comment 1 Notify RN   Glucose, capillary     Status: Abnormal   Collection Time: 01/28/17  4:08 PM  Result Value Ref Range   Glucose-Capillary 368 (H) 65 - 99 mg/dL   Comment 1 Notify RN   Glucose, capillary     Status: Abnormal   Collection Time: 01/28/17  9:28 PM  Result Value Ref Range   Glucose-Capillary 222 (H) 65 - 99 mg/dL  Glucose, capillary     Status: Abnormal   Collection Time: 01/29/17  6:29 AM  Result Value Ref Range   Glucose-Capillary 252 (H) 65 - 99 mg/dL  CBC with Differential/Platelet     Status: None   Collection Time: 01/29/17  6:37 AM  Result Value Ref Range   WBC 6.2 3.8 - 10.6 K/uL   RBC 4.64 4.40 - 5.90 MIL/uL   Hemoglobin 14.3 13.0 - 18.0 g/dL   HCT 01.7 79.3 - 90.3 %   MCV 87.3 80.0 - 100.0 fL   MCH 30.9 26.0 - 34.0 pg   MCHC 35.4  32.0 - 36.0 g/dL   RDW 29.9 37.1 - 69.6 %   Platelets 172 150 - 440 K/uL   Neutrophils Relative %  41 %   Neutro Abs 2.5 1.4 - 6.5 K/uL   Lymphocytes Relative 47 %   Lymphs Abs 2.9 1.0 - 3.6 K/uL   Monocytes Relative 7 %   Monocytes Absolute 0.4 0.2 - 1.0 K/uL   Eosinophils Relative 4 %   Eosinophils Absolute 0.3 0 - 0.7 K/uL   Basophils Relative 1 %   Basophils Absolute 0.1 0 - 0.1 K/uL  Glucose, capillary     Status: Abnormal   Collection Time: 01/29/17  7:15 AM  Result Value Ref Range   Glucose-Capillary 265 (H) 65 - 99 mg/dL   Comment 1 Notify RN     Blood Alcohol level:  Lab Results  Component Value Date   ETH <5 01/16/2017   ETH <5 12/21/2016    Metabolic Disorder Labs: Lab Results  Component Value Date   HGBA1C 7.6 (H) 01/19/2017   MPG 171.42 01/19/2017   MPG 249 11/09/2016   Lab Results  Component Value Date   PROLACTIN 24.5 (H) 09/24/2016   PROLACTIN 3.4 (L) 07/07/2016   Lab Results  Component Value Date   CHOL 122 01/19/2017   TRIG 154 (H) 01/19/2017   HDL 36 (L) 01/19/2017   CHOLHDL 3.4 01/19/2017   VLDL 31 01/19/2017   LDLCALC 55 01/19/2017   LDLCALC 59 12/28/2016    Physical Findings: AIMS: Facial and Oral Movements Muscles of Facial Expression: None, normal Lips and Perioral Area: None, normal Jaw: None, normal Tongue: None, normal,Extremity Movements Upper (arms, wrists, hands, fingers): Mild Lower (legs, knees, ankles, toes): None, normal, Trunk Movements Neck, shoulders, hips: None, normal, Overall Severity Severity of abnormal movements (highest score from questions above): Minimal Incapacitation due to abnormal movements: None, normal Patient's awareness of abnormal movements (rate only patient's report): No Awareness, Dental Status Current problems with teeth and/or dentures?: No Does patient usually wear dentures?: No  CIWA:    COWS:  COWS Total Score: 1  Musculoskeletal: Strength & Muscle Tone: decreased Gait & Station: normal Patient leans: [name]  Psychiatric Specialty Exam: Physical Exam  Nursing note and vitals  reviewed. Constitutional: He appears well-developed and well-nourished.  HENT:  Head: Normocephalic and atraumatic.  Eyes: Pupils are equal, round, and reactive to light. Conjunctivae are normal.  Neck: Normal range of motion.  Cardiovascular: Regular rhythm and normal heart sounds.   Respiratory: Effort normal and breath sounds normal. No respiratory distress.  GI: Soft.  Musculoskeletal: Normal range of motion.  Neurological: He is alert.  Skin: Skin is warm and dry.  Psychiatric: His affect is blunt. His speech is delayed. He is slowed and withdrawn. Thought content is paranoid. Cognition and memory are impaired. He expresses impulsivity. He expresses suicidal ideation. He expresses no homicidal ideation.    Review of Systems  Constitutional: Negative.   HENT: Negative.   Eyes: Negative.   Respiratory: Negative.   Cardiovascular: Negative.   Gastrointestinal: Negative.   Musculoskeletal: Negative.   Skin: Negative.   Neurological: Negative.   Psychiatric/Behavioral: Positive for depression, hallucinations, memory loss and suicidal ideas. Negative for substance abuse. The patient is nervous/anxious and has insomnia.     Blood pressure 137/89, pulse 87, temperature 99 F (37.2 C), temperature source Oral, resp. rate 18, height 6' (1.829 m), weight 78.5 kg (173 lb), SpO2 98 %.Body mass index is 23.46 kg/m.  General Appearance: Disheveled  Eye Contact:  Fair  Speech:  Slow  Volume:  Decreased  Mood:  Depressed and Dysphoric  Affect:  Constricted and Depressed  Thought Process:  Disorganized  Orientation:  Full (Time, Place, and Person)  Thought Content:  Illogical, Hallucinations: Auditory and Paranoid Ideation  Suicidal Thoughts:  Yes.  without intent/plan  Homicidal Thoughts:  No  Memory:  Immediate;   Good Recent;   Fair Remote;   Fair  Judgement:  Impaired  Insight:  Shallow  Psychomotor Activity:  Decreased  Concentration:  Concentration: Poor  Recall:  Poor  Fund  of Knowledge:  Poor  Language:  Fair  Akathisia:  No  Handed:  Right  AIMS (if indicated):     Assets:  Desire for Improvement  ADL's:  Impaired  Cognition:  Impaired,  Mild  Sleep:  Number of Hours: 6   Patient was schizoaffective disorder getting ECT and also medication stabilization. No real reason I don't think to increase the clozapine. I see that Zyprexa was added in the last day or so but I doubt that is going to be helpful and may make his blood sugar problem worse. We don't need anything to make him more sedated. I'm going to hold that any increase the dose of Celexa to 40 mg a day. As always encouraged him to get up out of bed and be more active. #Schizoaffective disorder, SI - Continue ECT - Next one on 01/27/17 - Continue clozapine 150 mg QHS, aim to optimize dose of antipsychotic monotherapy. Level pending and will hold at 150 mg.  -  -   #Smoking cessation Continue nicotine patch 21 mg/24 hours Continue propranolol 20 mg BID  #DM Continue metformin 1000 mg BIDCC Insulin sliding scale  #GERD Continue protonix EC 40 mg daily.    Lipid Panel     Component Value Date/Time   CHOL 122 01/19/2017 0637   TRIG 154 (H) 01/19/2017 0637   HDL 36 (L) 01/19/2017 0637   CHOLHDL 3.4 01/19/2017 0637   VLDL 31 01/19/2017 0637   LDLCALC 55 01/19/2017 0637  We are continuing ECT and medication changes including most recently an increase and change in his antidepressant. Treatment team aware of his multiple hospitalizations in the difficulty finding a safe place outside the hospital is considering referral to the state facility. Meanwhile work on improving current symptoms. Diabetes remains difficult to control. I did slightly increase his insulin yesterday and I don't want him to have a low blood sugar. Encouraged to stay on his regular diet without extra food in between meals. Strongly encouraged to be out of bed and attending therapy groups and interacting with peers and staff. Mordecai Rasmussen, MD 01/29/2017, 3:07 PM

## 2017-01-29 NOTE — Anesthesia Postprocedure Evaluation (Signed)
Anesthesia Post Note  Patient: Tony Cunningham  Procedure(s) Performed: * No procedures listed *  Patient location during evaluation: PACU Anesthesia Type: General Level of consciousness: awake and alert Pain management: pain level controlled Vital Signs Assessment: post-procedure vital signs reviewed and stable Respiratory status: spontaneous breathing, nonlabored ventilation, respiratory function stable and patient connected to nasal cannula oxygen Cardiovascular status: blood pressure returned to baseline and stable Postop Assessment: no signs of nausea or vomiting Anesthetic complications: no     Last Vitals:  Vitals:   01/29/17 1103 01/29/17 1113  BP: 119/88 110/77  Pulse: 84 77  Resp: (!) 29 (!) 0  Temp:  36.7 C  SpO2: 98% 100%    Last Pain:  Vitals:   01/29/17 1113  TempSrc:   PainSc: 0-No pain                 Cleda Mccreedy Piscitello

## 2017-01-29 NOTE — Anesthesia Preprocedure Evaluation (Signed)
Anesthesia Evaluation  Patient identified by MRN, date of birth, ID band Patient awake    Reviewed: Allergy & Precautions, NPO status , Patient's Chart, lab work & pertinent test results  History of Anesthesia Complications Negative for: history of anesthetic complications  Airway Mallampati: II  TM Distance: >3 FB Neck ROM: Full    Dental  (+) Poor Dentition, Chipped   Pulmonary asthma , Current Smoker,    breath sounds clear to auscultation- rhonchi (-) wheezing      Cardiovascular hypertension, Pt. on medications (-) CAD, (-) Past MI and (-) Cardiac Stents  Rhythm:Regular Rate:Normal - Systolic murmurs and - Diastolic murmurs    Neuro/Psych PSYCHIATRIC DISORDERS Anxiety Depression Schizophrenia negative neurological ROS     GI/Hepatic negative GI ROS, Neg liver ROS,   Endo/Other  diabetes, Insulin Dependent  Renal/GU negative Renal ROS     Musculoskeletal negative musculoskeletal ROS (+)   Abdominal (+) - obese,   Peds  Hematology negative hematology ROS (+)   Anesthesia Other Findings Signs and symptoms suggestive of sleep apnea   Past Medical History: No date: Anxiety No date: Asthma No date: Diabetes mellitus No date: High blood pressure No date: Sinus complaint  History reviewed. No pertinent surgical history.   Reproductive/Obstetrics                             Anesthesia Physical  Anesthesia Plan  ASA: III  Anesthesia Plan: General   Post-op Pain Management:    Induction: Intravenous  PONV Risk Score and Plan:   Airway Management Planned: Mask  Additional Equipment:   Intra-op Plan:   Post-operative Plan:   Informed Consent: I have reviewed the patients History and Physical, chart, labs and discussed the procedure including the risks, benefits and alternatives for the proposed anesthesia with the patient or authorized representative who has indicated  his/her understanding and acceptance.   Dental advisory given  Plan Discussed with: CRNA and Anesthesiologist  Anesthesia Plan Comments: (Patient consented for risks of anesthesia including but not limited to:  - adverse reactions to medications - damage to teeth, lips or other oral mucosa - sore throat or hoarseness - Damage to heart, brain, lungs or loss of life  Patient voiced understanding.)        Anesthesia Quick Evaluation  

## 2017-01-29 NOTE — [facility] Group Notes (Signed)
[facility] Group Notes:  (Nursing/MHT/Case Management/Adjunct)  Date:  01/29/2017  Time:  9:47 PM  Type of Therapy:  Evening Wrap-up Group  Participation Level:  Active  Participation Quality:  Appropriate and Attentive  Affect:  Appropriate  Cognitive:  Alert and Appropriate  Insight:  Appropriate, Good and Improving  Engagement in Group:  Developing/Improving and Engaged  Modes of Intervention:  Discussion  Summary of Progress/Problems:  Tony Cunningham 01/29/2017, 9:47 PM

## 2017-01-29 NOTE — Progress Notes (Signed)
Clozapine monitoring  9/07  ANC 2.5. Lab reported and patient eligible with weekly monitoring per [employer] website.   Stormy Card [facility] Clinical Pharmacist 01/29/2017 7:53 AM

## 2017-01-29 NOTE — Progress Notes (Signed)
Treatment team meeting today by phone including CSW, Tony Cunningham: Tony Cunningham of [facility], Tony Cunningham and Tony Cunningham, Care coordiantion/[facility].  Discussed is frank terms with [name] that living in the hospital is not an option.  Tony Cunningham reports he just gets anxious when he has been discharged.  Options were given to Tony Cunningham and his prefernces were: living with his mother is preferred over group home, PSR is preferred over ACT team.  Tony Cunningham has been involved with [name] long term and that went well prior to the recent hospitalizations.  Tony Cunningham would like to maintain the service with Tony Cunningham rather than C[facility].  Tony Cunningham will pursue [facility] [employer] PSR in [place] and increased hours with peer support.  CSW/[facility] to pursue return home to mother's home.  Tony Cunningham is committing to this plan. Garner Nash, MSW, LCSW Clinical Social Worker 01/29/2017 3:13 PM

## 2017-01-29 NOTE — [facility] Group Notes (Signed)
3:03 PM   Group Therapy with LCSW  (afternoon group)   Did not attend.  Shayann Garbutt LCSW, MSW Clinical Social Work: System Wide Float Coverage for :  [phone]  

## 2017-01-29 NOTE — Anesthesia Post-op Follow-up Note (Signed)
Anesthesia QCDR form completed.        

## 2017-01-29 NOTE — H&P (Signed)
Tony Cunningham is an 41 y.o. male.   Chief Complaint: Continues to feel depressed racing thoughts negative thoughts occasional hallucinations suicidal ideation. HPI: History of recurrent severe psychotic depression  Past Medical History:  Diagnosis Date  . Anxiety   . Asthma   . Diabetes mellitus   . High blood pressure   . Sinus complaint     History reviewed. No pertinent surgical history.  History reviewed. No pertinent family history. Social History:  reports that he has been smoking Cigarettes.  He has been smoking about 0.50 packs per day. He has never used smokeless tobacco. He reports that he does not drink alcohol or use drugs.  Allergies: No Known Allergies   (Not in a hospital admission)  Results for orders placed or performed during the hospital encounter of 01/19/17 (from the past 48 hour(s))  Glucose, capillary     Status: Abnormal   Collection Time: 01/27/17  4:36 PM  Result Value Ref Range   Glucose-Capillary 235 (H) 65 - 99 mg/dL  Glucose, capillary     Status: Abnormal   Collection Time: 01/27/17  9:02 PM  Result Value Ref Range   Glucose-Capillary 175 (H) 65 - 99 mg/dL  Glucose, capillary     Status: Abnormal   Collection Time: 01/28/17  7:05 AM  Result Value Ref Range   Glucose-Capillary 364 (H) 65 - 99 mg/dL  Glucose, capillary     Status: Abnormal   Collection Time: 01/28/17 11:24 AM  Result Value Ref Range   Glucose-Capillary 166 (H) 65 - 99 mg/dL   Comment 1 Notify RN   Glucose, capillary     Status: Abnormal   Collection Time: 01/28/17  4:08 PM  Result Value Ref Range   Glucose-Capillary 368 (H) 65 - 99 mg/dL   Comment 1 Notify RN   Glucose, capillary     Status: Abnormal   Collection Time: 01/28/17  9:28 PM  Result Value Ref Range   Glucose-Capillary 222 (H) 65 - 99 mg/dL  Glucose, capillary     Status: Abnormal   Collection Time: 01/29/17  6:29 AM  Result Value Ref Range   Glucose-Capillary 252 (H) 65 - 99 mg/dL  CBC with  Differential/Platelet     Status: None   Collection Time: 01/29/17  6:37 AM  Result Value Ref Range   WBC 6.2 3.8 - 10.6 K/uL   RBC 4.64 4.40 - 5.90 MIL/uL   Hemoglobin 14.3 13.0 - 18.0 g/dL   HCT 81.8 56.3 - 14.9 %   MCV 87.3 80.0 - 100.0 fL   MCH 30.9 26.0 - 34.0 pg   MCHC 35.4 32.0 - 36.0 g/dL   RDW 70.2 63.7 - 85.8 %   Platelets 172 150 - 440 K/uL   Neutrophils Relative % 41 %   Neutro Abs 2.5 1.4 - 6.5 K/uL   Lymphocytes Relative 47 %   Lymphs Abs 2.9 1.0 - 3.6 K/uL   Monocytes Relative 7 %   Monocytes Absolute 0.4 0.2 - 1.0 K/uL   Eosinophils Relative 4 %   Eosinophils Absolute 0.3 0 - 0.7 K/uL   Basophils Relative 1 %   Basophils Absolute 0.1 0 - 0.1 K/uL  Glucose, capillary     Status: Abnormal   Collection Time: 01/29/17  7:15 AM  Result Value Ref Range   Glucose-Capillary 265 (H) 65 - 99 mg/dL   Comment 1 Notify RN    No results found.  Review of Systems  Constitutional: Negative.   HENT: Negative.  Eyes: Negative.   Respiratory: Negative.   Cardiovascular: Negative.   Gastrointestinal: Negative.   Musculoskeletal: Negative.   Skin: Negative.   Neurological: Negative.   Psychiatric/Behavioral: Positive for depression, hallucinations, memory loss and suicidal ideas. Negative for substance abuse. The patient is nervous/anxious and has insomnia.     Blood pressure 118/88, pulse 68, temperature 98.7 F (37.1 C), temperature source Oral, resp. rate 18, height 6' (1.829 m), weight 76.7 kg (169 lb), SpO2 98 %. Physical Exam  Nursing note and vitals reviewed. Constitutional: He appears well-developed and well-nourished.  HENT:  Head: Normocephalic and atraumatic.  Eyes: Pupils are equal, round, and reactive to light. Conjunctivae are normal.  Neck: Normal range of motion.  Cardiovascular: Regular rhythm and normal heart sounds.   Respiratory: Effort normal and breath sounds normal. No respiratory distress.  GI: Soft.  Musculoskeletal: Normal range of motion.   Neurological: He is alert.  Skin: Skin is warm and dry.  Psychiatric: His mood appears anxious. His speech is delayed. He is slowed. Thought content is paranoid. Cognition and memory are impaired. He expresses inappropriate judgment. He exhibits a depressed mood. He expresses suicidal ideation. He expresses no suicidal plans.     Assessment/Plan ECT today continue into next week. Medication management. Working with treatment team trying to find some arrangement for discharge  Mordecai Rasmussen, MD 01/29/2017, 10:21 AM

## 2017-01-29 NOTE — Transfer of Care (Addendum)
Immediate Anesthesia Transfer of Care Note  Patient: Tony Cunningham  Procedure(s) Performed: ECT  Patient Location: PACU  Anesthesia Type:General  Level of Consciousness: sedated  Airway & Oxygen Therapy: Patient Spontanous Breathing and Patient connected to face mask oxygen  Post-op Assessment: Report given to RN and Post -op Vital signs reviewed and stable  Post vital signs: Reviewed and stable  Last Vitals:  Vitals:   01/29/17 1103 01/29/17 1113  BP: 119/88 110/77  Pulse: 84 77  Resp: (!) 29 (!) 0  Temp:  36.7 C  SpO2: 98% 100%    Last Pain:  Vitals:   01/29/17 1113  TempSrc:   PainSc: 0-No pain         Complications: No apparent anesthesia complications

## 2017-01-29 NOTE — Anesthesia Procedure Notes (Signed)
Date/Time: 01/29/2017 10:34 AM Performed by: Lily Kocher Pre-anesthesia Checklist: Patient identified, Emergency Drugs available, Suction available and Patient being monitored Patient Re-evaluated:Patient Re-evaluated prior to induction Oxygen Delivery Method: Circle system utilized Preoxygenation: Pre-oxygenation with 100% oxygen Induction Type: IV induction Ventilation: Mask ventilation without difficulty and Mask ventilation throughout procedure Airway Equipment and Method: Bite block Placement Confirmation: positive ETCO2 Dental Injury: Teeth and Oropharynx as per pre-operative assessment

## 2017-01-30 NOTE — Plan of Care (Signed)
Problem: Medication: Goal: Compliance with prescribed medication regimen will improve Outcome: Progressing Patient compliant with all HS medications and verbalizes understanding of regimen.   Problem: Self-Concept: Goal: Ability to disclose and discuss suicidal ideas will improve Outcome: Progressing Patient denies suicidal ideations and any thoughts of self harm at this time.   Problem: Activity: Goal: Imbalance in normal sleep/wake cycle will improve Outcome: Progressing Patient in bed resting with his eyes closed and appears to be doing so with no disturbances. Will continue to monitor.   Problem: Self-Concept: Goal: Level of anxiety will decrease Outcome: Progressing No complaints of anxiety or visible signs noted.

## 2017-01-30 NOTE — [facility] Group Notes (Signed)
[facility] LCSW Group Therapy 01/30/2017 1:15pm  Type of Therapy: Group Therapy- Feelings Around Discharge & Establishing a Supportive Framework  Participation Level:  Did Not Attend  Description of Group:   What is a supportive framework? What does it look like feel like and how do I discern it from and unhealthy non-supportive network? Learn how to cope when supports are not helpful and don't support you. Discuss what to do when your family/friends are not supportive.  Therapeutic Modalities:   Cognitive Behavioral Therapy Person-Centered Therapy Motivational Interviewing   Verdene Lennert, LCSW 01/30/2017 2:02 PM

## 2017-01-30 NOTE — Progress Notes (Addendum)
BDaily contact with patient to assess and evaluate symptoms and progress in treatment, Medication management and Plan [facility] MD Progress Note  01/30/2017 7:42 AM Tony Cunningham  MRN:  [id number] Subjective:  41 year old man with a history of schizoaffective disorder who came back into the hospital only shortly after his most recent discharge this time reporting hallucinations and suicidal ideation. On evaluation today patient is in bed staying withdrawn and hardly ever attending groups. He tells me his mood is very sad and feels frightened. He says he's having auditory hallucinations and active wishes that he were dead. He is not acting on any of these and is not aggressive. Patient has been compliant with medication. He admits to me that he really would rather stay in the hospital forever and does not really ever want to be placed in a group home or any other kind of even semi-independent living situation.  Follow-up for Thursday the 30th. Patient seen chart reviewed. This 41 year old man with chronic severe mental health problems and failure to transition to outpatient care is once again complaining of hallucinations. He is withdrawn most of the time. Minimal interaction with other people. Denies active suicidal thoughts. Hardly ever gets around other people though. Tolerating medicines well. His blood sugars are reasonably stable although he once again had an over 300 reading this evening.  Follow-up for Friday the 31st. Patient had ECT bilateral treatment this morning which was well tolerated. Seen this afternoon in treatment team he had no new complaint. Continues to describe himself as very depressed. Continues to report suicidal thoughts and hallucinations. Rarely gets out of his bed and attends almost no groups or therapeutic activities. Blood sugars remained somewhat labile with an elevated sugar this morning.  01/23/17 - patient reports that he continues to have monitoring hallucinations which are  troublesome and distressing. The voices make him feel suicidal. Spent most of the day in bed, saw him walking around in the evening. Denies active plan but does endorse that he has transient thoughts of suicide.  01/24/17 - patient reports that he feels "so-so". Continues to have monitoring hallucinations although not as severe as yesterday. Endorses that he had a good night's rest. I tried to clarify the dosing and duration of clozapine from him but he states that he does not know.  Follow-up for September 3. Patient still lying in bed all day. He tells me that he still hears voices but they are "less". He can't really quantify that. Denies active suicidal thoughts. Admits that he worries constantly about how he will function outside of the hospital. Chart reviewed. I note that the last clozapine level that came back was in the middle of last month and was well into the therapeutic range.  All up for September 5. Patient seen. Chart reviewed. He had ECT today which went well as usual. He continues to complain of feeling confused and of feeling very anxious. Says he has racing thoughts and suicidal thoughts. Stays very withdrawn in his bed.  Follow-up for Thursday the sixth. Patient remains bedbound almost constantly. He speaks very little. Continues to report having "racing thoughts" and feeling frightened. Affect flat and blunted. Unable to articulate much more detail than that. Poor self-care although it looks like his sugars are starting to creep up.  Follow-up note for Friday the seventh. Patient had ECT treatment again today which as usual was tolerated without difficulty. Patient continues to report that he feels sad and frightened and anxious. He says he still has some suicidal  thoughts but has no current plan to act on it. He says the hallucinations are getting a little better. Although he only has partial insight into it staff have noticed that he is a little bit more interactive and his affect a  little more appropriate. His hygiene has improved. Blood sugars remain labile.  01/30/2017. [name] is feeling "so so". He is in bed this morning but for the past two days he was visible on the unit in the afternoon with brighter affect and a little smile. When better, he approaches me for small conversation. He went outside yesterday. He accepts medications and tolerates them well. He seems more hopeful about the future.  Per nursing: Patient denies suicidal ideations and any thoughts of self harm at this time.   Principal Problem: Schizoaffective disorder, depressive type (HCC) Diagnosis:   Patient Active Problem List   Diagnosis Date Noted  . 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 Schizoaffective disorder, depressive type (HCC) [F25.1] 09/23/2016  . Polysubstance abuse [F19.10] 07/17/2016  . Tardive dyskinesia [G24.01] 07/16/2016  . Tobacco use disorder [F17.200] 07/07/2016  . Dyslipidemia [E78.5] 07/07/2016  . Asthma [J45.909] 07/07/2016  . HTN (hypertension) [I10] 07/06/2016  . Diabetes (HCC) [E11.9] 12/25/2010   Total Time spent with patient: 30 minutes  Past Psychiatric History: Patient has a long-standing history of mental illness but over the last year or so seems to of decompensated more than usual and has had one hospitalization after another. He did show some response to ECT and antipsychotic medication but seems to decompensate immediately whenever he is discharged.  Past Medical History:  Past Medical History:  Diagnosis Date  . Anxiety   . Asthma   . Diabetes mellitus   . High blood pressure   . Sinus complaint    History reviewed. No pertinent surgical history. Family History: History reviewed. No pertinent family history. Family Psychiatric  History: Unknown Social History:  History  Alcohol Use No     History  Drug Use No    Social History   Social History  . Marital status: Single    Spouse name: [name]  . Number of children: [name]   . Years of education: [name]   Social History Main Topics  . Smoking status: Current Every Day Smoker    Packs/day: 0.50    Types: Cigarettes  . Smokeless tobacco: Never Used  . Alcohol use No  . Drug use: No  . Sexual activity: Not Currently   Other Topics Concern  . None   Social History Narrative  . None   Additional Social History:                         Sleep: Fair  Appetite:  Poor  Current Medications: Current Facility-Administered Medications  Medication Dose Route Frequency Provider Last Rate Last Dose  . acetaminophen (TYLENOL) tablet 650 mg  650 mg Oral Q6H PRN [name], [name], MD   650 mg at 01/22/17 1247  . alum & mag hydroxide-simeth (MAALOX/MYLANTA) 200-200-20 MG/5ML suspension 30 mL  30 mL Oral Q4H PRN [name], [name], MD      . cloZAPine (CLOZARIL) tablet 150 mg  150 mg Oral QHS [name], [name], MD   150 mg at 01/29/17 2137  . FLUoxetine (PROZAC) capsule 40 mg  40 mg Oral Daily [name], [name], MD   40 mg at 01/29/17 1145  . insulin aspart (novoLOG) injection 0-5 Units  0-5 Units Subcutaneous QHS [name],  [name], MD   2 Units at 01/28/17 2130  . insulin aspart (novoLOG) injection 0-9 Units  0-9 Units Subcutaneous TID WC [name], [name], MD   3 Units at 01/29/17 1640  . insulin aspart (novoLOG) injection 5 Units  5 Units Subcutaneous TID WC [name], [name], MD   5 Units at 01/29/17 1640  . insulin glargine (LANTUS) injection 35 Units  35 Units Subcutaneous QHS [name], [name], MD   35 Units at 01/29/17 2138  . ketorolac (TORADOL) 30 MG/ML injection 30 mg  30 mg Intravenous Once [name], [name], MD      . lisinopril (PRINIVIL,ZESTRIL) tablet 20 mg  20 mg Oral Daily [name], [name], MD   20 mg at 01/29/17 0741  . magnesium hydroxide (MILK OF MAGNESIA) suspension 30 mL  30 mL Oral Daily PRN [name], [name], MD      . metFORMIN (GLUCOPHAGE) tablet 1,000 mg  1,000 mg Oral BID WC [name], [name], MD   1,000 mg at 01/29/17 1640  . midazolam (VERSED) injection  2 mg  2 mg Intravenous Once [name], [name], MD      . midazolam (VERSED) injection 4 mg  4 mg Intravenous Once [name], [name], MD      . montelukast (SINGULAIR) tablet 10 mg  10 mg Oral QHS [name], [name], MD   10 mg at 01/29/17 2137  . nicotine (NICODERM CQ - dosed in mg/24 hours) patch 21 mg  21 mg Transdermal Daily Tennille Montelongo B, MD   21 mg at 01/29/17 1145  . pantoprazole (PROTONIX) EC tablet 40 mg  40 mg Oral Daily [name], [name], MD   40 mg at 01/29/17 1145  . propranolol (INDERAL) tablet 20 mg  20 mg Oral BID [name], [name], MD   20 mg at 01/29/17 1642    Lab Results:  Results for orders placed or performed during the hospital encounter of 01/19/17 (from the past 48 hour(s))  Glucose, capillary     Status: Abnormal   Collection Time: 01/28/17 11:24 AM  Result Value Ref Range   Glucose-Capillary 166 (H) 65 - 99 mg/dL   Comment 1 Notify RN   Glucose, capillary     Status: Abnormal   Collection Time: 01/28/17  4:08 PM  Result Value Ref Range   Glucose-Capillary 368 (H) 65 - 99 mg/dL   Comment 1 Notify RN   Glucose, capillary     Status: Abnormal   Collection Time: 01/28/17  9:28 PM  Result Value Ref Range   Glucose-Capillary 222 (H) 65 - 99 mg/dL  Glucose, capillary     Status: Abnormal   Collection Time: 01/29/17  6:29 AM  Result Value Ref Range   Glucose-Capillary 252 (H) 65 - 99 mg/dL  CBC with Differential/Platelet     Status: None   Collection Time: 01/29/17  6:37 AM  Result Value Ref Range   WBC 6.2 3.8 - 10.6 K/uL   RBC 4.64 4.40 - 5.90 MIL/uL   Hemoglobin 14.3 13.0 - 18.0 g/dL   HCT 41.6 38.4 - 53.6 %   MCV 87.3 80.0 - 100.0 fL   MCH 30.9 26.0 - 34.0 pg   MCHC 35.4 32.0 - 36.0 g/dL   RDW 46.8 03.2 - 12.2 %   Platelets 172 150 - 440 K/uL   Neutrophils Relative % 41 %   Neutro Abs 2.5 1.4 - 6.5 K/uL   Lymphocytes Relative 47 %   Lymphs Abs 2.9 1.0 -  3.6 K/uL   Monocytes Relative 7 %   Monocytes Absolute 0.4 0.2 - 1.0 K/uL   Eosinophils Relative 4 %    Eosinophils Absolute 0.3 0 - 0.7 K/uL   Basophils Relative 1 %   Basophils Absolute 0.1 0 - 0.1 K/uL  Glucose, capillary     Status: Abnormal   Collection Time: 01/29/17  7:15 AM  Result Value Ref Range   Glucose-Capillary 265 (H) 65 - 99 mg/dL   Comment 1 Notify RN   Glucose, capillary     Status: Abnormal   Collection Time: 01/29/17 11:37 AM  Result Value Ref Range   Glucose-Capillary 208 (H) 65 - 99 mg/dL   Comment 1 Notify RN     Blood Alcohol level:  Lab Results  Component Value Date   ETH <5 01/16/2017   ETH <5 12/21/2016    Metabolic Disorder Labs: Lab Results  Component Value Date   HGBA1C 7.6 (H) 01/19/2017   MPG 171.42 01/19/2017   MPG 249 11/09/2016   Lab Results  Component Value Date   PROLACTIN 24.5 (H) 09/24/2016   PROLACTIN 3.4 (L) 07/07/2016   Lab Results  Component Value Date   CHOL 122 01/19/2017   TRIG 154 (H) 01/19/2017   HDL 36 (L) 01/19/2017   CHOLHDL 3.4 01/19/2017   VLDL 31 01/19/2017   LDLCALC 55 01/19/2017   LDLCALC 59 12/28/2016    Physical Findings: AIMS: Facial and Oral Movements Muscles of Facial Expression: None, normal Lips and Perioral Area: None, normal Jaw: None, normal Tongue: None, normal,Extremity Movements Upper (arms, wrists, hands, fingers): Mild Lower (legs, knees, ankles, toes): None, normal, Trunk Movements Neck, shoulders, hips: None, normal, Overall Severity Severity of abnormal movements (highest score from questions above): Minimal Incapacitation due to abnormal movements: None, normal Patient's awareness of abnormal movements (rate only patient's report): No Awareness, Dental Status Current problems with teeth and/or dentures?: No Does patient usually wear dentures?: No  CIWA:    COWS:  COWS Total Score: 1  Musculoskeletal: Strength & Muscle Tone: decreased Gait & Station: normal Patient leans: [name]  Psychiatric Specialty Exam: Physical Exam  Nursing note and vitals reviewed. Constitutional: He  appears well-developed and well-nourished.  HENT:  Head: Normocephalic and atraumatic.  Eyes: Pupils are equal, round, and reactive to light. Conjunctivae are normal.  Neck: Normal range of motion.  Cardiovascular: Regular rhythm and normal heart sounds.   Respiratory: Effort normal and breath sounds normal. No respiratory distress.  GI: Soft.  Musculoskeletal: Normal range of motion.  Neurological: He is alert.  Skin: Skin is warm and dry.  Psychiatric: His affect is blunt. His speech is delayed. He is slowed and withdrawn. Thought content is paranoid. Cognition and memory are impaired. He expresses impulsivity. He expresses suicidal ideation. He expresses no homicidal ideation.    Review of Systems  Constitutional: Negative.   HENT: Negative.   Eyes: Negative.   Respiratory: Negative.   Cardiovascular: Negative.   Gastrointestinal: Negative.   Musculoskeletal: Negative.   Skin: Negative.   Neurological: Negative.   Psychiatric/Behavioral: Positive for depression, hallucinations, memory loss and suicidal ideas. Negative for substance abuse. The patient is nervous/anxious and has insomnia.     Blood pressure 125/87, pulse (!) 103, temperature 98 F (36.7 C), temperature source Oral, resp. rate 18, height 6' (1.829 m), weight 78.5 kg (173 lb), SpO2 98 %.Body mass index is 23.46 kg/m.  General Appearance: Disheveled  Eye Contact:  Fair  Speech:  Slow  Volume:  Decreased  Mood:  Depressed and Dysphoric  Affect:  Constricted and Depressed  Thought Process:  Disorganized  Orientation:  Full (Time, Place, and Person)  Thought Content:  Illogical, Hallucinations: Auditory and Paranoid Ideation  Suicidal Thoughts:  Yes.  without intent/plan  Homicidal Thoughts:  No  Memory:  Immediate;   Good Recent;   Fair Remote;   Fair  Judgement:  Impaired  Insight:  Shallow  Psychomotor Activity:  Decreased  Concentration:  Concentration: Poor  Recall:  Poor  Fund of Knowledge:  Poor   Language:  Fair  Akathisia:  No  Handed:  Right  AIMS (if indicated):     Assets:  Desire for Improvement  ADL's:  Impaired  Cognition:  Impaired,  Mild  Sleep:  Number of Hours: 6.25   Mr. Tamika has a history of schizoaffective disorder admitted for worsening of depression receiving ECT.  #Schizoaffective disorder, SI - Continue ECT - Next one on 02/01/17 - Continue clozapine 150 mg QHS, aim to optimize dose of antipsychotic monotherapy. Level pending and will hold at 150 mg.  - Prozac 40 mg.  HTN. He is on Lisinopril and Propranolol.   #Smoking cessation Continue nicotine patch 21 mg/24 hours  #DM Continue metformin 1000 mg BIDCC, Lantus 35 units at bedtime, Novolog 5 units before meals, ADA diet. Insulin sliding scale  #GERD Continue protonix EC 40 mg daily.   Disposition. TBE.   We are continuing ECT and medication changes including most recently an increase and change in his antidepressant. Treatment team aware of his multiple hospitalizations in the difficulty finding a safe place outside the hospital is considering referral to the state facility. Meanwhile work on improving current symptoms. Diabetes remains difficult to control. I did slightly increase his insulin yesterday and I don't want him to have a low blood sugar. Encouraged to stay on his regular diet without extra food in between meals. Strongly encouraged to be out of bed and attending therapy groups and interacting with peers and staff.  01/30/2017. No medication adjustments today.  I certify that the services received since the previous certification/recertification were and continue to be medically necessary as the treatment provided can be reasonably expected to improve the patient's condition; the medical record documents that the services furnished were intensive treatment services or their equivalent services, and this patient continues to need, on a daily basis, active treatment furnished directly by or  requiring the supervision of inpatient psychiatric personnel.  Kristine Linea, MD 01/30/2017, 7:42 AM

## 2017-01-30 NOTE — [facility] Group Notes (Signed)
[facility] Group Notes:  (Nursing/MHT/Case Management/Adjunct)  Date:  01/30/2017  Time:  9:43 PM  Type of Therapy:  Group Therapy  Participation Level:  Active  Participation Quality:  Appropriate  Affect:  Appropriate  Cognitive:  Appropriate  Insight:  Good  Engagement in Group:  Supportive  Modes of Intervention:  Activity  Summary of Progress/Problems:  Tony Cunningham 01/30/2017, 9:43 PM

## 2017-01-31 MED ORDER — ENSURE ENLIVE PO LIQD
237.0000 mL | Freq: Three times a day (TID) | ORAL | Status: DC
  Administered 2017-01-31 – 2017-02-02 (×6): 237 mL via ORAL

## 2017-01-31 NOTE — Progress Notes (Signed)
Patient ID: Tony Cunningham, male   DOB: [birth year], 41 y.o.   MRN: [id number] LCSW Group Therapy Note  01/31/2017 1pm  Type of Therapy and Topic:  Group Therapy:  Cognitive Distortions  Participation Level:  Did Not Attend   Description of Group:    Patients in this group will be introduced to the topic of cognitive distortions.  Patients will identify and describe cognitive distortions, describe the feelings these distortions create for them.  Patients will identify one or more situations in their personal life where they have cognitively distorted thinking and will verbalize challenging this cognitive distortion through positive thinking skills.  Patients will practice the skill of using positive affirmations to challenge cognitive distortions using affirmation cards.    Therapeutic Goals:  1. Patient will identify two or more cognitive distortions they have used 2. Patient will identify one or more emotions that stem from use of a cognitive distortion 3. Patient will demonstrate use of a positive affirmation to counter a cognitive distortion through discussion and/or role play. 4. Patient will describe one way cognitive distortions can be detrimental to wellness   Summary of Patient Progress:     Therapeutic Modalities:   Cognitive Behavioral Therapy Motivational Interviewing   Glennon Mac, LCSW 01/31/2017 4:46 PM

## 2017-01-31 NOTE — Progress Notes (Signed)
Patient is alert and oriented to person, place and time. Skin is warm, dry and intact. No limitations to all four extremities noted. Patient denies SI, AVH at this time. Patient was observed ambulating in hall during the shift with a steady gait. Continues to isolate himself to his room, comes out occasionally and ambulates to the day room.. Milieu remains therapeutic. Patient will be monitored and physician notified of any acute changes.

## 2017-01-31 NOTE — [facility] Group Notes (Signed)
[facility] Group Notes:  (Nursing/MHT/Case Management/Adjunct)  Date:  01/31/2017  Time:  10:11 PM  Type of Therapy:  Group Therapy  Participation Level:  Active  Participation Quality:  Appropriate  Affect:  Appropriate  Cognitive:  Appropriate  Insight:  Appropriate  Engagement in Group:  Engaged  Modes of Intervention:  Discussion  Summary of Progress/Problems:  Tony Cunningham 01/31/2017, 10:11 PM

## 2017-01-31 NOTE — Plan of Care (Signed)
Problem: Medication: Goal: Compliance with prescribed medication regimen will improve Outcome: Progressing Patient is med compliant. Novolog insulin education provided. Education needs reinforcement.   Problem: Activity: Goal: Interest or engagement in leisure activities will improve Outcome: Progressing Patient was visible in the day area and social with select peers. Per report he has been outside for group activity. Patient states he attends daily group meetings.

## 2017-01-31 NOTE — Progress Notes (Signed)
BDaily contact with patient to assess and evaluate symptoms and progress in treatment, Medication management and Plan [facility] MD Progress Note  01/31/2017 12:45 PM Tony Cunningham  MRN:  [id number] Subjective:  41 year old man with a history of schizoaffective disorder who came back into the hospital only shortly after his most recent discharge this time reporting hallucinations and suicidal ideation. On evaluation today patient is in bed staying withdrawn and hardly ever attending groups. He tells me his mood is very sad and feels frightened. He says he's having auditory hallucinations and active wishes that he were dead. He is not acting on any of these and is not aggressive. Patient has been compliant with medication. He admits to me that he really would rather stay in the hospital forever and does not really ever want to be placed in a group home or any other kind of even semi-independent living situation.  Follow-up for Thursday the 30th. Patient seen chart reviewed. This 41 year old man with chronic severe mental health problems and failure to transition to outpatient care is once again complaining of hallucinations. He is withdrawn most of the time. Minimal interaction with other people. Denies active suicidal thoughts. Hardly ever gets around other people though. Tolerating medicines well. His blood sugars are reasonably stable although he once again had an over 300 reading this evening.  Follow-up for Friday the 31st. Patient had ECT bilateral treatment this morning which was well tolerated. Seen this afternoon in treatment team he had no new complaint. Continues to describe himself as very depressed. Continues to report suicidal thoughts and hallucinations. Rarely gets out of his bed and attends almost no groups or therapeutic activities. Blood sugars remained somewhat labile with an elevated sugar this morning.  01/23/17 - patient reports that he continues to have monitoring hallucinations which are  troublesome and distressing. The voices make him feel suicidal. Spent most of the day in bed, saw him walking around in the evening. Denies active plan but does endorse that he has transient thoughts of suicide.  01/24/17 - patient reports that he feels "so-so". Continues to have monitoring hallucinations although not as severe as yesterday. Endorses that he had a good night's rest. I tried to clarify the dosing and duration of clozapine from him but he states that he does not know.  Follow-up for September 3. Patient still lying in bed all day. He tells me that he still hears voices but they are "less". He can't really quantify that. Denies active suicidal thoughts. Admits that he worries constantly about how he will function outside of the hospital. Chart reviewed. I note that the last clozapine level that came back was in the middle of last month and was well into the therapeutic range.  All up for September 5. Patient seen. Chart reviewed. He had ECT today which went well as usual. He continues to complain of feeling confused and of feeling very anxious. Says he has racing thoughts and suicidal thoughts. Stays very withdrawn in his bed.  Follow-up for Thursday the sixth. Patient remains bedbound almost constantly. He speaks very little. Continues to report having "racing thoughts" and feeling frightened. Affect flat and blunted. Unable to articulate much more detail than that. Poor self-care although it looks like his sugars are starting to creep up.  Follow-up note for Friday the seventh. Patient had ECT treatment again today which as usual was tolerated without difficulty. Patient continues to report that he feels sad and frightened and anxious. He says he still has some suicidal  thoughts but has no current plan to act on it. He says the hallucinations are getting a little better. Although he only has partial insight into it staff have noticed that he is a little bit more interactive and his affect a  little more appropriate. His hygiene has improved. Blood sugars remain labile.  01/30/2017. Tony Cunningham is feeling "so so". He is in bed this morning but for the past two days he was visible on the unit in the afternoon with brighter affect and a little smile. When better, he approaches me for small conversation. He went outside yesterday. He accepts medications and tolerates them well. He seems more hopeful about the future.  01/31/2017. Tony Cunningham has been more visible in the milieu this weekend. He came to group and participated in activities outside yesterday. Today in the morning however, he doubts that any progress has been made. No somatic complaints. Tolerates teatment well.  Per nursing: Patient was visible in the day area and social with select peers. Per report he has been outside for group activity. Patient states he attends daily group meetings.   Principal Problem: Schizoaffective disorder, depressive type (HCC) Diagnosis:   Patient Active Problem List   Diagnosis Date Noted  . 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 Schizoaffective disorder, depressive type (HCC) [F25.1] 09/23/2016  . Polysubstance abuse [F19.10] 07/17/2016  . Tardive dyskinesia [G24.01] 07/16/2016  . Tobacco use disorder [F17.200] 07/07/2016  . Dyslipidemia [E78.5] 07/07/2016  . Asthma [J45.909] 07/07/2016  . HTN (hypertension) [I10] 07/06/2016  . Diabetes (HCC) [E11.9] 12/25/2010   Total Time spent with patient: 30 minutes  Past Psychiatric History: Patient has a long-standing history of mental illness but over the last year or so seems to of decompensated more than usual and has had one hospitalization after another. He did show some response to ECT and antipsychotic medication but seems to decompensate immediately whenever he is discharged.  Past Medical History:  Past Medical History:  Diagnosis Date  . Anxiety   . Asthma   . Diabetes mellitus   . High blood pressure   . Sinus  complaint    History reviewed. No pertinent surgical history. Family History: History reviewed. No pertinent family history. Family Psychiatric  History: Unknown Social History:  History  Alcohol Use No     History  Drug Use No    Social History   Social History  . Marital status: Single    Spouse name: [name]  . Number of children: [name]  . Years of education: [name]   Social History Main Topics  . Smoking status: Current Every Day Smoker    Packs/day: 0.50    Types: Cigarettes  . Smokeless tobacco: Never Used  . Alcohol use No  . Drug use: No  . Sexual activity: Not Currently   Other Topics Concern  . None   Social History Narrative  . None   Additional Social History:                         Sleep: Fair  Appetite:  Poor  Current Medications: Current Facility-Administered Medications  Medication Dose Route Frequency Provider Last Rate Last Dose  . acetaminophen (TYLENOL) tablet 650 mg  650 mg Oral Q6H PRN [name], [name], MD   650 mg at 01/22/17 1247  . alum & mag hydroxide-simeth (MAALOX/MYLANTA) 200-200-20 MG/5ML suspension 30 mL  30 mL Oral Q4H PRN [name], [name], MD      . cloZAPine (  CLOZARIL) tablet 150 mg  150 mg Oral QHS [name], [name], MD   150 mg at 01/30/17 2203  . FLUoxetine (PROZAC) capsule 40 mg  40 mg Oral Daily [name], [name], MD   40 mg at 01/31/17 0735  . insulin aspart (novoLOG) injection 0-5 Units  0-5 Units Subcutaneous QHS [name], [name], MD   4 Units at 01/30/17 2206  . insulin aspart (novoLOG) injection 0-9 Units  0-9 Units Subcutaneous TID WC [name], [name], MD   2 Units at 01/31/17 1140  . insulin aspart (novoLOG) injection 5 Units  5 Units Subcutaneous TID WC [name], [name], MD   5 Units at 01/31/17 1140  . insulin glargine (LANTUS) injection 35 Units  35 Units Subcutaneous QHS [name], [name], MD   35 Units at 01/30/17 2204  . ketorolac (TORADOL) 30 MG/ML injection 30 mg  30 mg Intravenous Once [name], [name], MD      .  lisinopril (PRINIVIL,ZESTRIL) tablet 20 mg  20 mg Oral Daily [name], [name], MD   20 mg at 01/31/17 0736  . magnesium hydroxide (MILK OF MAGNESIA) suspension 30 mL  30 mL Oral Daily PRN [name], [name], MD      . metFORMIN (GLUCOPHAGE) tablet 1,000 mg  1,000 mg Oral BID WC [name], [name], MD   1,000 mg at 01/31/17 0736  . midazolam (VERSED) injection 2 mg  2 mg Intravenous Once [name], [name], MD      . midazolam (VERSED) injection 4 mg  4 mg Intravenous Once [name], [name], MD      . montelukast (SINGULAIR) tablet 10 mg  10 mg Oral QHS [name], [name], MD   10 mg at 01/30/17 2203  . nicotine (NICODERM CQ - dosed in mg/24 hours) patch 21 mg  21 mg Transdermal Daily Bradely Rudin B, MD   21 mg at 01/30/17 0841  . pantoprazole (PROTONIX) EC tablet 40 mg  40 mg Oral Daily [name], [name], MD   40 mg at 01/31/17 0736  . propranolol (INDERAL) tablet 20 mg  20 mg Oral BID [name], [name], MD   20 mg at 01/31/17 1610    Lab Results:  No results found for this or any previous visit (from the past 48 hour(s)).  Blood Alcohol level:  Lab Results  Component Value Date   ETH <5 01/16/2017   ETH <5 12/21/2016    Metabolic Disorder Labs: Lab Results  Component Value Date   HGBA1C 7.6 (H) 01/19/2017   MPG 171.42 01/19/2017   MPG 249 11/09/2016   Lab Results  Component Value Date   PROLACTIN 24.5 (H) 09/24/2016   PROLACTIN 3.4 (L) 07/07/2016   Lab Results  Component Value Date   CHOL 122 01/19/2017   TRIG 154 (H) 01/19/2017   HDL 36 (L) 01/19/2017   CHOLHDL 3.4 01/19/2017   VLDL 31 01/19/2017   LDLCALC 55 01/19/2017   LDLCALC 59 12/28/2016    Physical Findings: AIMS: Facial and Oral Movements Muscles of Facial Expression: None, normal Lips and Perioral Area: None, normal Jaw: None, normal Tongue: None, normal,Extremity Movements Upper (arms, wrists, hands, fingers): Mild Lower (legs, knees, ankles, toes): None, normal, Trunk Movements Neck, shoulders, hips: None, normal,  Overall Severity Severity of abnormal movements (highest score from questions above): Minimal Incapacitation due to abnormal movements: None, normal Patient's awareness of abnormal movements (rate only patient's report): No Awareness, Dental Status Current problems with teeth and/or dentures?: No Does patient usually  wear dentures?: No  CIWA:    COWS:  COWS Total Score: 1  Musculoskeletal: Strength & Muscle Tone: decreased Gait & Station: normal Patient leans: [name]  Psychiatric Specialty Exam: Physical Exam  Nursing note and vitals reviewed. Constitutional: He appears well-developed and well-nourished.  HENT:  Head: Normocephalic and atraumatic.  Eyes: Pupils are equal, round, and reactive to light. Conjunctivae are normal.  Neck: Normal range of motion.  Cardiovascular: Regular rhythm and normal heart sounds.   Respiratory: Effort normal and breath sounds normal. No respiratory distress.  GI: Soft.  Musculoskeletal: Normal range of motion.  Neurological: He is alert.  Skin: Skin is warm and dry.  Psychiatric: His affect is blunt. His speech is delayed. He is slowed and withdrawn. Thought content is paranoid. Cognition and memory are impaired. He expresses impulsivity. He expresses suicidal ideation. He expresses no homicidal ideation.    Review of Systems  Constitutional: Negative.   HENT: Negative.   Eyes: Negative.   Respiratory: Negative.   Cardiovascular: Negative.   Gastrointestinal: Negative.   Musculoskeletal: Negative.   Skin: Negative.   Neurological: Negative.   Psychiatric/Behavioral: Positive for depression, hallucinations, memory loss and suicidal ideas. Negative for substance abuse. The patient is nervous/anxious and has insomnia.     Blood pressure 122/77, pulse 87, temperature 98.3 F (36.8 C), temperature source Oral, resp. rate 18, height 6' (1.829 m), weight 78.5 kg (173 lb), SpO2 99 %.Body mass index is 23.46 kg/m.  General Appearance: Disheveled  Eye  Contact:  Fair  Speech:  Slow  Volume:  Decreased  Mood:  Depressed and Dysphoric  Affect:  Constricted and Depressed  Thought Process:  Disorganized  Orientation:  Full (Time, Place, and Person)  Thought Content:  Illogical, Hallucinations: Auditory and Paranoid Ideation  Suicidal Thoughts:  Yes.  without intent/plan  Homicidal Thoughts:  No  Memory:  Immediate;   Good Recent;   Fair Remote;   Fair  Judgement:  Impaired  Insight:  Shallow  Psychomotor Activity:  Decreased  Concentration:  Concentration: Poor  Recall:  Poor  Fund of Knowledge:  Poor  Language:  Fair  Akathisia:  No  Handed:  Right  AIMS (if indicated):     Assets:  Desire for Improvement  ADL's:  Impaired  Cognition:  Impaired,  Mild  Sleep:  Number of Hours: 6.25   Tony Cunningham has a history of schizoaffective disorder admitted for worsening of depression receiving ECT.  #Schizoaffective disorder, SI - Continue ECT - Next one on 02/01/17 - Continue clozapine 150 mg QHS, aim to optimize dose of antipsychotic monotherapy. Level pending and will hold at 150 mg.  - Prozac 40 mg.  HTN. He is on Lisinopril and Propranolol.   #Smoking cessation Continue nicotine patch 21 mg/24 hours  #DM Continue metformin 1000 mg BIDCC, Lantus 35 units at bedtime, Novolog 5 units before meals, ADA diet. Insulin sliding scale  #GERD Continue protonix EC 40 mg daily.   Disposition. TBE.   We are continuing ECT and medication changes including most recently an increase and change in his antidepressant. Treatment team aware of his multiple hospitalizations in the difficulty finding a safe place outside the hospital is considering referral to the state facility. Meanwhile work on improving current symptoms. Diabetes remains difficult to control. I did slightly increase his insulin yesterday and I don't want him to have a low blood sugar. Encouraged to stay on his regular diet without extra food in between meals. Strongly  encouraged to be out of  bed and attending therapy groups and interacting with peers and staff.  01/30/2017. No medication adjustments today. 01/31/2017. No medication adjustments today.  I certify that the services received since the previous certification/recertification were and continue to be medically necessary as the treatment provided can be reasonably expected to improve the patient's condition; the medical record documents that the services furnished were intensive treatment services or their equivalent services, and this patient continues to need, on a daily basis, active treatment furnished directly by or requiring the supervision of inpatient psychiatric personnel.  Kristine Linea, MD 01/31/2017, 12:45 PM

## 2017-01-31 NOTE — Plan of Care (Signed)
Problem: Education: Goal: Ability to make informed decisions regarding treatment will improve Outcome: Progressing Patient has show growth in the ability to make decisions regarding treatment.  Problem: Health Behavior/Discharge Planning: Goal: Identification of resources available to assist in meeting health care needs will improve Outcome: Not Progressing Can not identify the resources that can assist in meeting health care needs.  Problem: Medication: Goal: Compliance with prescribed medication regimen will improve Outcome: Progressing Patient is compliant with his medications.

## 2017-02-01 ENCOUNTER — Inpatient Hospital Stay: Payer: Medicare Other | Admitting: Certified Registered"

## 2017-02-01 ENCOUNTER — Inpatient Hospital Stay: Payer: Medicare Other

## 2017-02-01 LAB — GLUCOSE, CAPILLARY
GLUCOSE-CAPILLARY: 127 mg/dL — AB (ref 65–99)
GLUCOSE-CAPILLARY: 202 mg/dL — AB (ref 65–99)
GLUCOSE-CAPILLARY: 216 mg/dL — AB (ref 65–99)
GLUCOSE-CAPILLARY: 302 mg/dL — AB (ref 65–99)
GLUCOSE-CAPILLARY: 280 mg/dL — AB (ref 65–99)
GLUCOSE-CAPILLARY: 182 mg/dL — AB (ref 65–99)
GLUCOSE-CAPILLARY: 237 mg/dL — AB (ref 65–99)
GLUCOSE-CAPILLARY: 153 mg/dL — AB (ref 65–99)
GLUCOSE-CAPILLARY: 193 mg/dL — AB (ref 65–99)
GLUCOSE-CAPILLARY: 323 mg/dL — AB (ref 65–99)
Glucose-Capillary: 240 mg/dL — ABNORMAL HIGH (ref 65–99)
Glucose-Capillary: 150 mg/dL — ABNORMAL HIGH (ref 65–99)
Glucose-Capillary: 107 mg/dL — ABNORMAL HIGH (ref 65–99)
Glucose-Capillary: 292 mg/dL — ABNORMAL HIGH (ref 65–99)
Glucose-Capillary: 164 mg/dL — ABNORMAL HIGH (ref 65–99)

## 2017-02-01 MED ORDER — MIDAZOLAM HCL 2 MG/2ML IJ SOLN
INTRAMUSCULAR | Status: AC
  Filled 2017-02-01: qty 2

## 2017-02-01 MED ORDER — METHOHEXITAL SODIUM 100 MG/10ML IV SOSY
PREFILLED_SYRINGE | INTRAVENOUS | Status: DC | PRN
  Administered 2017-02-01: 80 mg via INTRAVENOUS

## 2017-02-01 MED ORDER — SUCCINYLCHOLINE CHLORIDE 20 MG/ML IJ SOLN
INTRAMUSCULAR | Status: DC | PRN
  Administered 2017-02-01: 100 mg via INTRAVENOUS

## 2017-02-01 MED ORDER — HALOPERIDOL LACTATE 5 MG/ML IJ SOLN
5.0000 mg | Freq: Once | INTRAMUSCULAR | Status: AC
  Administered 2017-02-10: 5 mg via INTRAVENOUS
  Filled 2017-02-01: qty 1

## 2017-02-01 MED ORDER — HALOPERIDOL LACTATE 5 MG/ML IJ SOLN
5.0000 mg | Freq: Once | INTRAMUSCULAR | Status: AC
  Administered 2017-02-03: 5 mg via INTRAVENOUS
  Filled 2017-02-01: qty 1

## 2017-02-01 MED ORDER — MIDAZOLAM HCL 2 MG/2ML IJ SOLN
4.0000 mg | Freq: Once | INTRAMUSCULAR | Status: AC
  Administered 2017-02-03: 4 mg via INTRAVENOUS

## 2017-02-01 MED ORDER — METHOHEXITAL SODIUM 0.5 G IJ SOLR
INTRAMUSCULAR | Status: AC
  Filled 2017-02-01: qty 500

## 2017-02-01 MED ORDER — HALOPERIDOL LACTATE 5 MG/ML IJ SOLN
INTRAMUSCULAR | Status: AC
  Filled 2017-02-01: qty 1

## 2017-02-01 MED ORDER — HALOPERIDOL LACTATE 5 MG/ML IJ SOLN
INTRAMUSCULAR | Status: DC | PRN
  Administered 2017-02-01: 5 mg via INTRAVENOUS

## 2017-02-01 MED ORDER — SODIUM CHLORIDE 0.9 % IV SOLN
500.0000 mL | Freq: Once | INTRAVENOUS | Status: AC
  Administered 2017-02-01: 10:00:00 via INTRAVENOUS

## 2017-02-01 MED ORDER — SODIUM CHLORIDE 0.9 % IV SOLN
INTRAVENOUS | Status: DC | PRN
  Administered 2017-02-01: 10:00:00 via INTRAVENOUS

## 2017-02-01 MED ORDER — SUCCINYLCHOLINE CHLORIDE 20 MG/ML IJ SOLN
INTRAMUSCULAR | Status: AC
  Filled 2017-02-01: qty 1

## 2017-02-01 NOTE — Anesthesia Post-op Follow-up Note (Signed)
Anesthesia QCDR form completed.        

## 2017-02-01 NOTE — Progress Notes (Signed)
BDaily contact with patient to assess and evaluate symptoms and progress in treatment, Medication management and Plan [facility] MD Progress Note  02/01/2017 5:30 PM Tony Cunningham  MRN:  [id number] Subjective:  41 year old man with a history of schizoaffective disorder who came back into the hospital only shortly after his most recent discharge this time reporting hallucinations and suicidal ideation. On evaluation today patient is in bed staying withdrawn and hardly ever attending groups. He tells me his mood is very sad and feels frightened. He says he's having auditory hallucinations and active wishes that he were dead. He is not acting on any of these and is not aggressive. Patient has been compliant with medication. He admits to me that he really would rather stay in the hospital forever and does not really ever want to be placed in a group home or any other kind of even semi-independent living situation.  Follow-up for Thursday the 30th. Patient seen chart reviewed. This 41 year old man with chronic severe mental health problems and failure to transition to outpatient care is once again complaining of hallucinations. He is withdrawn most of the time. Minimal interaction with other people. Denies active suicidal thoughts. Hardly ever gets around other people though. Tolerating medicines well. His blood sugars are reasonably stable although he once again had an over 300 reading this evening.  Follow-up for Friday the 31st. Patient had ECT bilateral treatment this morning which was well tolerated. Seen this afternoon in treatment team he had no new complaint. Continues to describe himself as very depressed. Continues to report suicidal thoughts and hallucinations. Rarely gets out of his bed and attends almost no groups or therapeutic activities. Blood sugars remained somewhat labile with an elevated sugar this morning.  01/23/17 - patient reports that he continues to have monitoring hallucinations which are  troublesome and distressing. The voices make him feel suicidal. Spent most of the day in bed, saw him walking around in the evening. Denies active plan but does endorse that he has transient thoughts of suicide.  01/24/17 - patient reports that he feels "so-so". Continues to have monitoring hallucinations although not as severe as yesterday. Endorses that he had a good night's rest. I tried to clarify the dosing and duration of clozapine from him but he states that he does not know.  Follow-up for September 3. Patient still lying in bed all day. He tells me that he still hears voices but they are "less". He can't really quantify that. Denies active suicidal thoughts. Admits that he worries constantly about how he will function outside of the hospital. Chart reviewed. I note that the last clozapine level that came back was in the middle of last month and was well into the therapeutic range.  All up for September 5. Patient seen. Chart reviewed. He had ECT today which went well as usual. He continues to complain of feeling confused and of feeling very anxious. Says he has racing thoughts and suicidal thoughts. Stays very withdrawn in his bed.  Follow-up for Thursday the sixth. Patient remains bedbound almost constantly. He speaks very little. Continues to report having "racing thoughts" and feeling frightened. Affect flat and blunted. Unable to articulate much more detail than that. Poor self-care although it looks like his sugars are starting to creep up.  Follow-up note for Friday the seventh. Patient had ECT treatment again today which as usual was tolerated without difficulty. Patient continues to report that he feels sad and frightened and anxious. He says he still has some suicidal  thoughts but has no current plan to act on it. He says the hallucinations are getting a little better. Although he only has partial insight into it staff have noticed that he is a little bit more interactive and his affect a  little more appropriate. His hygiene has improved. Blood sugars remain labile.  01/30/2017. Tony Cunningham is feeling "so so". He is in bed this morning but for the past two days he was visible on the unit in the afternoon with brighter affect and a little smile. When better, he approaches me for small conversation. He went outside yesterday. He accepts medications and tolerates them well. He seems more hopeful about the future.  01/31/2017. Tony Cunningham has been more visible in the milieu this weekend. He came to group and participated in activities outside yesterday. Today in the morning however, he doubts that any progress has been made. No somatic complaints. Tolerates teatment well.  Follow-up for the 10th. Patient continues to be a little bit more visible. Affect is still generally flat and depressed. Treatment team discussed the patient today. Patient has been confronted and advised that he really needs to commit himself to making an effort to stay outside the hospital at some point. Patient is seeming to make a little more an effort. Had ECT again today. Tolerating medicines well. Blood sugars fairly stable  Per nursing: Patient was visible in the day area and social with select peers. Per report he has been outside for group activity. Patient states he attends daily group meetings.   Principal Problem: Schizoaffective disorder, depressive type (HCC) Diagnosis:   Patient Active Problem List   Diagnosis Date Noted  . 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 Schizoaffective disorder, depressive type (HCC) [F25.1] 09/23/2016  . Polysubstance abuse [F19.10] 07/17/2016  . Tardive dyskinesia [G24.01] 07/16/2016  . Tobacco use disorder [F17.200] 07/07/2016  . Dyslipidemia [E78.5] 07/07/2016  . Asthma [J45.909] 07/07/2016  . HTN (hypertension) [I10] 07/06/2016  . Diabetes (HCC) [E11.9] 12/25/2010   Total Time spent with patient: 30 minutes  Past Psychiatric History: Patient has a  long-standing history of mental illness but over the last year or so seems to of decompensated more than usual and has had one hospitalization after another. He did show some response to ECT and antipsychotic medication but seems to decompensate immediately whenever he is discharged.  Past Medical History:  Past Medical History:  Diagnosis Date  . Anxiety   . Asthma   . Diabetes mellitus   . High blood pressure   . Sinus complaint    History reviewed. No pertinent surgical history. Family History: History reviewed. No pertinent family history. Family Psychiatric  History: Unknown Social History:  History  Alcohol Use No     History  Drug Use No    Social History   Social History  . Marital status: Single    Spouse name: [name]  . Number of children: [name]  . Years of education: [name]   Social History Main Topics  . Smoking status: Current Every Day Smoker    Packs/day: 0.50    Types: Cigarettes  . Smokeless tobacco: Never Used  . Alcohol use No  . Drug use: No  . Sexual activity: Not Currently   Other Topics Concern  . None   Social History Narrative  . None   Additional Social History:                         Sleep: Fair  Appetite:  Poor  Current Medications: Current Facility-Administered Medications  Medication Dose Route Frequency Provider Last Rate Last Dose  . acetaminophen (TYLENOL) tablet 650 mg  650 mg Oral Q6H PRN [name], [name], MD   650 mg at 01/22/17 1247  . alum & mag hydroxide-simeth (MAALOX/MYLANTA) 200-200-20 MG/5ML suspension 30 mL  30 mL Oral Q4H PRN [name], [name], MD      . cloZAPine (CLOZARIL) tablet 150 mg  150 mg Oral QHS [name], [name], MD   150 mg at 01/31/17 2254  . feeding supplement (ENSURE ENLIVE) (ENSURE ENLIVE) liquid 237 mL  237 mL Oral TID BM [name], [name], MD   237 mL at 02/01/17 1652  . FLUoxetine (PROZAC) capsule 40 mg  40 mg Oral Daily [name], [name], MD   40 mg at 02/01/17 1147  . haloperidol lactate  (HALDOL) injection 5 mg  5 mg Intravenous Once [name], [name], MD      . haloperidol lactate (HALDOL) injection 5 mg  5 mg Intravenous Once [name], [name], MD      . insulin aspart (novoLOG) injection 0-5 Units  0-5 Units Subcutaneous QHS [name], [name], MD   5 Units at 01/31/17 2256  . insulin aspart (novoLOG) injection 0-9 Units  0-9 Units Subcutaneous TID WC [name], [name], MD   2 Units at 02/01/17 1651  . insulin aspart (novoLOG) injection 5 Units  5 Units Subcutaneous TID WC [name], [name], MD   5 Units at 02/01/17 1650  . insulin glargine (LANTUS) injection 35 Units  35 Units Subcutaneous QHS [name], [name], MD   35 Units at 01/31/17 2254  . ketorolac (TORADOL) 30 MG/ML injection 30 mg  30 mg Intravenous Once [name], [name], MD      . lisinopril (PRINIVIL,ZESTRIL) tablet 20 mg  20 mg Oral Daily [name], [name], MD   20 mg at 02/01/17 0758  . magnesium hydroxide (MILK OF MAGNESIA) suspension 30 mL  30 mL Oral Daily PRN [name], [name], MD      . metFORMIN (GLUCOPHAGE) tablet 1,000 mg  1,000 mg Oral BID WC [name], [name], MD   1,000 mg at 02/01/17 1648  . midazolam (VERSED) injection 2 mg  2 mg Intravenous Once [name], [name], MD      . midazolam (VERSED) injection 4 mg  4 mg Intravenous Once [name], [name], MD      . montelukast (SINGULAIR) tablet 10 mg  10 mg Oral QHS [name], [name], MD   10 mg at 01/31/17 2254  . nicotine (NICODERM CQ - dosed in mg/24 hours) patch 21 mg  21 mg Transdermal Daily [name], [name], MD   21 mg at 01/30/17 0841  . pantoprazole (PROTONIX) EC tablet 40 mg  40 mg Oral Daily [name], [name], MD   40 mg at 02/01/17 0758  . propranolol (INDERAL) tablet 20 mg  20 mg Oral BID [name], [name], MD   20 mg at 02/01/17 1648    Lab Results:  Results for orders placed or performed during the hospital encounter of 01/19/17 (from the past 48 hour(s))  Glucose, capillary     Status: Abnormal   Collection Time: 01/30/17  8:56 PM  Result Value Ref Range    Glucose-Capillary 302 (H) 65 - 99 mg/dL  Glucose, capillary     Status: Abnormal   Collection Time: 01/31/17  7:11 AM  Result Value Ref Range   Glucose-Capillary 280 (H) 65 -  99 mg/dL  Glucose, capillary     Status: Abnormal   Collection Time: 01/31/17 11:29 AM  Result Value Ref Range   Glucose-Capillary 182 (H) 65 - 99 mg/dL  Glucose, capillary     Status: Abnormal   Collection Time: 01/31/17  4:04 PM  Result Value Ref Range   Glucose-Capillary 107 (H) 65 - 99 mg/dL  Glucose, capillary     Status: Abnormal   Collection Time: 01/31/17  9:02 PM  Result Value Ref Range   Glucose-Capillary 292 (H) 65 - 99 mg/dL  Glucose, capillary     Status: Abnormal   Collection Time: 02/01/17  5:20 AM  Result Value Ref Range   Glucose-Capillary 237 (H) 65 - 99 mg/dL  Glucose, capillary     Status: Abnormal   Collection Time: 02/01/17  9:38 AM  Result Value Ref Range   Glucose-Capillary 153 (H) 65 - 99 mg/dL  Glucose, capillary     Status: Abnormal   Collection Time: 02/01/17 10:29 AM  Result Value Ref Range   Glucose-Capillary 193 (H) 65 - 99 mg/dL  Glucose, capillary     Status: Abnormal   Collection Time: 02/01/17 11:31 AM  Result Value Ref Range   Glucose-Capillary 164 (H) 65 - 99 mg/dL    Blood Alcohol level:  Lab Results  Component Value Date   ETH <5 01/16/2017   ETH <5 12/21/2016    Metabolic Disorder Labs: Lab Results  Component Value Date   HGBA1C 7.6 (H) 01/19/2017   MPG 171.42 01/19/2017   MPG 249 11/09/2016   Lab Results  Component Value Date   PROLACTIN 24.5 (H) 09/24/2016   PROLACTIN 3.4 (L) 07/07/2016   Lab Results  Component Value Date   CHOL 122 01/19/2017   TRIG 154 (H) 01/19/2017   HDL 36 (L) 01/19/2017   CHOLHDL 3.4 01/19/2017   VLDL 31 01/19/2017   LDLCALC 55 01/19/2017   LDLCALC 59 12/28/2016    Physical Findings: AIMS: Facial and Oral Movements Muscles of Facial Expression: None, normal Lips and Perioral Area: None, normal Jaw: None,  normal Tongue: None, normal,Extremity Movements Upper (arms, wrists, hands, fingers): Mild Lower (legs, knees, ankles, toes): None, normal, Trunk Movements Neck, shoulders, hips: None, normal, Overall Severity Severity of abnormal movements (highest score from questions above): Minimal Incapacitation due to abnormal movements: None, normal Patient's awareness of abnormal movements (rate only patient's report): No Awareness, Dental Status Current problems with teeth and/or dentures?: No Does patient usually wear dentures?: No  CIWA:    COWS:  COWS Total Score: 1  Musculoskeletal: Strength & Muscle Tone: decreased Gait & Station: normal Patient leans: [name]  Psychiatric Specialty Exam: Physical Exam  Nursing note and vitals reviewed. Constitutional: He appears well-developed and well-nourished.  HENT:  Head: Normocephalic and atraumatic.  Eyes: Pupils are equal, round, and reactive to light. Conjunctivae are normal.  Neck: Normal range of motion.  Cardiovascular: Regular rhythm and normal heart sounds.   Respiratory: Effort normal and breath sounds normal. No respiratory distress.  GI: Soft.  Musculoskeletal: Normal range of motion.  Neurological: He is alert.  Skin: Skin is warm and dry.  Psychiatric: His affect is blunt. His speech is delayed. He is slowed and withdrawn. Thought content is paranoid. Cognition and memory are impaired. He expresses impulsivity. He expresses suicidal ideation. He expresses no homicidal ideation.    Review of Systems  Constitutional: Negative.   HENT: Negative.   Eyes: Negative.   Respiratory: Negative.   Cardiovascular: Negative.  Gastrointestinal: Negative.   Musculoskeletal: Negative.   Skin: Negative.   Neurological: Negative.   Psychiatric/Behavioral: Positive for depression, hallucinations, memory loss and suicidal ideas. Negative for substance abuse. The patient is nervous/anxious and has insomnia.     Blood pressure 104/61, pulse 86,  temperature (!) 96.8 F (36 C), resp. rate 20, height 6' (1.829 m), weight 78.5 kg (173 lb), SpO2 100 %.Body mass index is 23.46 kg/m.  General Appearance: Disheveled  Eye Contact:  Fair  Speech:  Slow  Volume:  Decreased  Mood:  Depressed and Dysphoric  Affect:  Constricted and Depressed  Thought Process:  Disorganized  Orientation:  Full (Time, Place, and Person)  Thought Content:  Illogical, Hallucinations: Auditory and Paranoid Ideation  Suicidal Thoughts:  Yes.  without intent/plan  Homicidal Thoughts:  No  Memory:  Immediate;   Good Recent;   Fair Remote;   Fair  Judgement:  Impaired  Insight:  Shallow  Psychomotor Activity:  Decreased  Concentration:  Concentration: Poor  Recall:  Poor  Fund of Knowledge:  Poor  Language:  Fair  Akathisia:  No  Handed:  Right  AIMS (if indicated):     Assets:  Desire for Improvement  ADL's:  Impaired  Cognition:  Impaired,  Mild  Sleep:  Number of Hours: 6.25   Tony Cunningham has a history of schizoaffective disorder admitted for worsening of depression receiving ECT.  #Schizoaffective disorder, SI - Continue ECT - Next one on 02/01/17 - Continue clozapine 150 mg QHS, aim to optimize dose of antipsychotic monotherapy. Level pending and will hold at 150 mg.  - Prozac 40 mg.  HTN. He is on Lisinopril and Propranolol.   #Smoking cessation Continue nicotine patch 21 mg/24 hours  #DM Continue metformin 1000 mg BIDCC, Lantus 35 units at bedtime, Novolog 5 units before meals, ADA diet. Insulin sliding scale  #GERD Continue protonix EC 40 mg daily.   Disposition. TBE.   We are continuing ECT and medication changes including most recently an increase and change in his antidepressant. Treatment team aware of his multiple hospitalizations in the difficulty finding a safe place outside the hospital is considering referral to the state facility. Meanwhile work on improving current symptoms. Diabetes remains difficult to control. I did  slightly increase his insulin yesterday and I don't want him to have a low blood sugar. Encouraged to stay on his regular diet without extra food in between meals. Strongly encouraged to be out of bed and attending therapy groups and interacting with peers and staff.  01/30/2017. No medication adjustments today. 01/31/2017. No medication adjustments today.  No changes to medication. ECT today. Spent time with him before and after treatment discussing the overall plan. Treatment team rounded.  I certify that the services received since the previous certification/recertification were and continue to be medically necessary as the treatment provided can be reasonably expected to improve the patient's condition; the medical record documents that the services furnished were intensive treatment services or their equivalent services, and this patient continues to need, on a daily basis, active treatment furnished directly by or requiring the supervision of inpatient psychiatric personnel.  Mordecai Rasmussen, MD 02/01/2017, 5:30 PM

## 2017-02-01 NOTE — Tx Team (Signed)
Interdisciplinary Treatment and Diagnostic Plan Update  02/01/2017 Time of Session: 1345 [address] MRN: [id number]  Principal Diagnosis: Schizoaffective disorder, depressive type (HCC)  Secondary Diagnoses: Principal Problem:   Schizoaffective disorder, depressive type (HCC) Active Problems:   Diabetes (HCC)   HTN (hypertension)   Tobacco use disorder   Dyslipidemia   Tardive dyskinesia   Current Medications:  Current Facility-Administered Medications  Medication Dose Route Frequency Provider Last Rate Last Dose  . acetaminophen (TYLENOL) tablet 650 mg  650 mg Oral Q6H PRN [name], [name], MD   650 mg at 01/22/17 1247  . alum & mag hydroxide-simeth (MAALOX/MYLANTA) 200-200-20 MG/5ML suspension 30 mL  30 mL Oral Q4H PRN [name], [name], MD      . cloZAPine (CLOZARIL) tablet 150 mg  150 mg Oral QHS [name], [name], MD   150 mg at 01/31/17 2254  . feeding supplement (ENSURE ENLIVE) (ENSURE ENLIVE) liquid 237 mL  237 mL Oral TID BM [name], [name], MD   237 mL at 01/31/17 2030  . FLUoxetine (PROZAC) capsule 40 mg  40 mg Oral Daily [name], [name], MD   40 mg at 02/01/17 1147  . haloperidol lactate (HALDOL) injection 5 mg  5 mg Intravenous Once [name], [name], MD      . haloperidol lactate (HALDOL) injection 5 mg  5 mg Intravenous Once [name], [name], MD      . insulin aspart (novoLOG) injection 0-5 Units  0-5 Units Subcutaneous QHS [name], [name], MD   5 Units at 01/31/17 2256  . insulin aspart (novoLOG) injection 0-9 Units  0-9 Units Subcutaneous TID WC [name], [name], MD   2 Units at 02/01/17 1148  . insulin aspart (novoLOG) injection 5 Units  5 Units Subcutaneous TID WC [name], [name], MD   5 Units at 02/01/17 1150  . insulin glargine (LANTUS) injection 35 Units  35 Units Subcutaneous QHS [name], [name], MD   35 Units at 01/31/17 2254  . ketorolac (TORADOL) 30 MG/ML injection 30 mg  30 mg Intravenous Once [name], [name], MD      . lisinopril (PRINIVIL,ZESTRIL) tablet 20  mg  20 mg Oral Daily [name], [name], MD   20 mg at 02/01/17 0758  . magnesium hydroxide (MILK OF MAGNESIA) suspension 30 mL  30 mL Oral Daily PRN [name], [name], MD      . metFORMIN (GLUCOPHAGE) tablet 1,000 mg  1,000 mg Oral BID WC [name], [name], MD   1,000 mg at 02/01/17 1146  . midazolam (VERSED) injection 2 mg  2 mg Intravenous Once [name], [name], MD      . midazolam (VERSED) injection 4 mg  4 mg Intravenous Once [name], [name], MD      . montelukast (SINGULAIR) tablet 10 mg  10 mg Oral QHS [name], [name], MD   10 mg at 01/31/17 2254  . nicotine (NICODERM CQ - dosed in mg/24 hours) patch 21 mg  21 mg Transdermal Daily [name], [name], MD   21 mg at 01/30/17 0841  . pantoprazole (PROTONIX) EC tablet 40 mg  40 mg Oral Daily [name], [name], MD   40 mg at 02/01/17 0758  . propranolol (INDERAL) tablet 20 mg  20 mg Oral BID [name], [name], MD   20 mg at 02/01/17 6222   PTA Medications: Prescriptions Prior to Admission  Medication Sig Dispense Refill Last Dose  . amantadine (SYMMETREL) 100 MG capsule Take 1 capsule (100 mg  total) by mouth 2 (two) times daily. For prevention of drug induced tremors 60 capsule 0 Unknown at Unknown  . benztropine (COGENTIN) 1 MG tablet Take 1 tablet (1 mg total) by mouth 2 (two) times daily. For prevention of drug induced tremors 60 tablet 0 Unknown at Unknown  . cholecalciferol 400 units tablet Take 1 tablet (400 Units total) by mouth daily. For bone health 30 each 0 Unknown at Unknown  . citalopram (CELEXA) 20 MG tablet Take 1 tablet (20 mg total) by mouth daily. For depression 30 tablet 0 Unknown at Unknown  . cloZAPine (CLOZARIL) 50 MG tablet Take 3 tablets (150 mg total) by mouth at bedtime. For mood control 90 tablet 0 Unknown at Unknown  . cyclobenzaprine (FLEXERIL) 5 MG tablet Take 1 tablet (5 mg total) by mouth 3 (three) times daily as needed for muscle spasms. 15 tablet 0 Unknown at Unknown  . diclofenac sodium (VOLTAREN) 1 % GEL Apply 2 g  topically 4 (four) times daily. For arthritis pain   Unknown at Unknown  . insulin aspart (NOVOLOG) 100 UNIT/ML injection Inject 6 Units into the skin 3 (three) times daily with meals. 10 mL 11 Unknown at Unknown  . insulin glargine (LANTUS) 100 UNIT/ML injection Inject 0.45 mLs (45 Units total) into the skin at bedtime. For diabetic management 10 mL 0   . insulin glargine (LANTUS) 100 UNIT/ML injection Inject 0.4 mLs (40 Units total) into the skin at bedtime. 10 mL 11   . lisinopril (PRINIVIL,ZESTRIL) 20 MG tablet Take 1 tablet (20 mg total) by mouth daily. For high blood pressure 30 tablet 0 Unknown at Unknown  . metFORMIN (GLUCOPHAGE) 1000 MG tablet Take 1 tablet (1,000 mg total) by mouth 2 (two) times daily with a meal. For diabetes management 60 tablet 0 Unknown at Unknown  . montelukast (SINGULAIR) 10 MG tablet Take 1 tablet (10 mg total) by mouth at bedtime. For shortness of breath 30 tablet 0 Unknown at Unknown  . nicotine polacrilex (NICORETTE) 2 MG gum Take 1 each (2 mg total) by mouth as needed for smoking cessation. 100 tablet 0 Unknown at Unknown  . pantoprazole (PROTONIX) 40 MG tablet Take 1 tablet (40 mg total) by mouth daily. For acid reflux 30 tablet 0 Unknown at Unknown  . polyethylene glycol (MIRALAX / GLYCOLAX) packet Take 17 g by mouth daily. For constipation 14 each 0 Unknown at Unknown  . propranolol (INDERAL) 20 MG tablet Take 1 tablet (20 mg total) by mouth 2 (two) times daily. For high blood pressure 60 tablet 0 01/27/2017 at 0619  . simvastatin (ZOCOR) 20 MG tablet Take 1 tablet (20 mg total) by mouth daily at 6 PM. For high Cholesterol 30 tablet 0 Unknown at Unknown  . temazepam (RESTORIL) 7.5 MG capsule Take 1 capsule (7.5 mg total) by mouth at bedtime. For sleep 14 capsule 0 Unknown at Unknown    Patient Stressors: Health problems Medication change or noncompliance  Patient Strengths: Active sense of humor Motivation for treatment/growth  Treatment Modalities:  Medication Management, Group therapy, Case management,  1 to 1 session with clinician, Psychoeducation, Recreational therapy.   Physician Treatment Plan for Primary Diagnosis: Schizoaffective disorder, depressive type (HCC) Long Term Goal(s): Improvement in symptoms so as ready for discharge NA   Short Term Goals: Ability to identify changes in lifestyle to reduce recurrence of condition will improve Ability to verbalize feelings will improve Ability to disclose and discuss suicidal ideas Ability to demonstrate self-control will improve Ability to identify  and develop effective coping behaviors will improve Ability to maintain clinical measurements within normal limits will improve Ability to identify triggers associated with substance abuse/mental health issues will improve NA  Medication Management: Evaluate patient's response, side effects, and tolerance of medication regimen.  Therapeutic Interventions: 1 to 1 sessions, Unit Group sessions and Medication administration.  Evaluation of Outcomes: Progressing  Physician Treatment Plan for Secondary Diagnosis: Principal Problem:   Schizoaffective disorder, depressive type (HCC) Active Problems:   Diabetes (HCC)   HTN (hypertension)   Tobacco use disorder   Dyslipidemia   Tardive dyskinesia  Long Term Goal(s): Improvement in symptoms so as ready for discharge NA   Short Term Goals: Ability to identify changes in lifestyle to reduce recurrence of condition will improve Ability to verbalize feelings will improve Ability to disclose and discuss suicidal ideas Ability to demonstrate self-control will improve Ability to identify and develop effective coping behaviors will improve Ability to maintain clinical measurements within normal limits will improve Ability to identify triggers associated with substance abuse/mental health issues will improve NA     Medication Management: Evaluate patient's response, side effects, and  tolerance of medication regimen.  Therapeutic Interventions: 1 to 1 sessions, Unit Group sessions and Medication administration.  Evaluation of Outcomes: Progressing   RN Treatment Plan for Primary Diagnosis: Schizoaffective disorder, depressive type (HCC) Long Term Goal(s): Knowledge of disease and therapeutic regimen to maintain health will improve  Short Term Goals: Ability to identify and develop effective coping behaviors will improve and Compliance with prescribed medications will improve  Medication Management: RN will administer medications as ordered by provider, will assess and evaluate patient's response and provide education to patient for prescribed medication. RN will report any adverse and/or side effects to prescribing provider.  Therapeutic Interventions: 1 on 1 counseling sessions, Psychoeducation, Medication administration, Evaluate responses to treatment, Monitor vital signs and CBGs as ordered, Perform/monitor CIWA, COWS, AIMS and Fall Risk screenings as ordered, Perform wound care treatments as ordered.  Evaluation of Outcomes: Progressing   LCSW Treatment Plan for Primary Diagnosis: Schizoaffective disorder, depressive type (HCC) Long Term Goal(s): Safe transition to appropriate next level of care at discharge, Engage patient in therapeutic group addressing interpersonal concerns.  Short Term Goals: Engage patient in aftercare planning with referrals and resources and Increase skills for wellness and recovery  Therapeutic Interventions: Assess for all discharge needs, 1 to 1 time with Social worker, Explore available resources and support systems, Assess for adequacy in community support network, Educate family and significant other(s) on suicide prevention, Complete Psychosocial Assessment, Interpersonal group therapy.  Evaluation of Outcomes: Progressing   Progress in Treatment: Attending groups: No. Participating in groups: No. Taking medication as  prescribed: Yes. Toleration medication: Yes. Family/Significant other contact made: No, will contact:  attempts made Patient understands diagnosis: Yes. Discussing patient identified problems/goals with staff: Yes. Medical problems stabilized or resolved: Yes. Denies suicidal/homicidal ideation: Yes. Issues/concerns per patient self-inventory: No. Other: none  New problem(s) identified: No, Describe:  none  New Short Term/Long Term Goal(s): Pt unable to formulate a goal.  Discharge Plan or Barriers: Treatment team meeting on 01/29/17 identified [facility] day program and peer support as after care plan.  Reason for Continuation of Hospitalization: Other; describe ECT  Estimated Length of Stay: 5 days  Attendees: Patient:   Physician: Dr. Toni Amend, MD 02/01/2017   Nursing:    RN Care Manager:   Social Worker: Daleen Squibb, LCSW 02/01/2017   Recreational Therapist:    Other:    Other:  Other:           Scribe for Treatment Team: Wyn Quaker 02/01/2017 1:55 PM

## 2017-02-01 NOTE — Anesthesia Preprocedure Evaluation (Signed)
Anesthesia Evaluation  Patient identified by MRN, date of birth, ID band Patient awake    Reviewed: Allergy & Precautions, NPO status , Patient's Chart, lab work & pertinent test results  History of Anesthesia Complications Negative for: history of anesthetic complications  Airway Mallampati: II  TM Distance: >3 FB Neck ROM: Full    Dental  (+) Poor Dentition, Chipped   Pulmonary asthma , Current Smoker,    breath sounds clear to auscultation- rhonchi (-) wheezing      Cardiovascular hypertension, Pt. on medications (-) CAD, (-) Past MI and (-) Cardiac Stents  Rhythm:Regular Rate:Normal - Systolic murmurs and - Diastolic murmurs    Neuro/Psych PSYCHIATRIC DISORDERS Anxiety Depression Schizophrenia negative neurological ROS     GI/Hepatic negative GI ROS, Neg liver ROS,   Endo/Other  diabetes, Insulin Dependent  Renal/GU negative Renal ROS     Musculoskeletal negative musculoskeletal ROS (+)   Abdominal (+) - obese,   Peds  Hematology negative hematology ROS (+)   Anesthesia Other Findings Signs and symptoms suggestive of sleep apnea   Past Medical History: No date: Anxiety No date: Asthma No date: Diabetes mellitus No date: High blood pressure No date: Sinus complaint  History reviewed. No pertinent surgical history.   Reproductive/Obstetrics                             Anesthesia Physical  Anesthesia Plan  ASA: III  Anesthesia Plan: General   Post-op Pain Management:    Induction: Intravenous  PONV Risk Score and Plan:   Airway Management Planned: Mask  Additional Equipment:   Intra-op Plan:   Post-operative Plan:   Informed Consent: I have reviewed the patients History and Physical, chart, labs and discussed the procedure including the risks, benefits and alternatives for the proposed anesthesia with the patient or authorized representative who has indicated  his/her understanding and acceptance.   Dental advisory given  Plan Discussed with: CRNA and Anesthesiologist  Anesthesia Plan Comments: (Patient consented for risks of anesthesia including but not limited to:  - adverse reactions to medications - damage to teeth, lips or other oral mucosa - sore throat or hoarseness - Damage to heart, brain, lungs or loss of life  Patient voiced understanding.)        Anesthesia Quick Evaluation  

## 2017-02-01 NOTE — Transfer of Care (Signed)
Immediate Anesthesia Transfer of Care Note  Patient: Tony Cunningham  Procedure(s) Performed: * No procedures listed *  Patient Location: PACU  Anesthesia Type:General  Level of Consciousness: drowsy and patient cooperative  Airway & Oxygen Therapy: Patient Spontanous Breathing and Patient connected to face mask oxygen  Post-op Assessment: Report given to RN and Post -op Vital signs reviewed and stable  Post vital signs: Reviewed and stable  Last Vitals:  Vitals:   02/01/17 0642 02/01/17 0909  BP: 124/82 112/80  Pulse: 90 65  Resp: 18 16  Temp: 36.7 C 36.8 C  SpO2:  99%    Last Pain:  Vitals:   02/01/17 0909  TempSrc: Oral  PainSc:       Patients Stated Pain Goal: 2 (01/19/17 2300)  Complications: No apparent anesthesia complications

## 2017-02-01 NOTE — Anesthesia Postprocedure Evaluation (Signed)
Anesthesia Post Note  Patient: Tony Cunningham  Procedure(s) Performed: * No procedures listed *  Patient location during evaluation: PACU Anesthesia Type: General Level of consciousness: awake and alert Pain management: pain level controlled Vital Signs Assessment: post-procedure vital signs reviewed and stable Respiratory status: spontaneous breathing, nonlabored ventilation, respiratory function stable and patient connected to nasal cannula oxygen Cardiovascular status: blood pressure returned to baseline and stable Postop Assessment: no signs of nausea or vomiting Anesthetic complications: no     Last Vitals:  Vitals:   02/01/17 1054 02/01/17 1104  BP: (!) 126/92 134/77  Pulse: 77 75  Resp:    Temp:    SpO2:      Last Pain:  Vitals:   02/01/17 1109  TempSrc:   PainSc: 0-No pain                 Cleda Mccreedy Piscitello

## 2017-02-01 NOTE — Progress Notes (Signed)
The response of the patient is delayed. An example is when this nurse asked him his name and birth date he waited about 6 seconds, he appeared to be thinking about what was asked and then he answered on his name. The birth date had to be asked separately. When this nurse asked for him to lift his shirt to apply the cardiac monitor he waited about 5-6 seconds and then slowly lifted his shirt. Each response was delayed today. MD notified. Previously the patient was able to respond without delay.

## 2017-02-01 NOTE — H&P (Signed)
Tony Cunningham is an 41 y.o. male.   Chief Complaint: Patient continues to complain of depression although he says he was slightly more active this weekend HPI: Recurrent severe depression possible schizoaffective disorder history of suicidality.  Past Medical History:  Diagnosis Date  . Anxiety   . Asthma   . Diabetes mellitus   . High blood pressure   . Sinus complaint     History reviewed. No pertinent surgical history.  History reviewed. No pertinent family history. Social History:  reports that he has been smoking Cigarettes.  He has been smoking about 0.50 packs per day. He has never used smokeless tobacco. He reports that he does not drink alcohol or use drugs.  Allergies: No Known Allergies  Medications Prior to Admission  Medication Sig Dispense Refill  . amantadine (SYMMETREL) 100 MG capsule Take 1 capsule (100 mg total) by mouth 2 (two) times daily. For prevention of drug induced tremors 60 capsule 0  . benztropine (COGENTIN) 1 MG tablet Take 1 tablet (1 mg total) by mouth 2 (two) times daily. For prevention of drug induced tremors 60 tablet 0  . cholecalciferol 400 units tablet Take 1 tablet (400 Units total) by mouth daily. For bone health 30 each 0  . citalopram (CELEXA) 20 MG tablet Take 1 tablet (20 mg total) by mouth daily. For depression 30 tablet 0  . cloZAPine (CLOZARIL) 50 MG tablet Take 3 tablets (150 mg total) by mouth at bedtime. For mood control 90 tablet 0  . cyclobenzaprine (FLEXERIL) 5 MG tablet Take 1 tablet (5 mg total) by mouth 3 (three) times daily as needed for muscle spasms. 15 tablet 0  . diclofenac sodium (VOLTAREN) 1 % GEL Apply 2 g topically 4 (four) times daily. For arthritis pain    . insulin aspart (NOVOLOG) 100 UNIT/ML injection Inject 6 Units into the skin 3 (three) times daily with meals. 10 mL 11  . insulin glargine (LANTUS) 100 UNIT/ML injection Inject 0.45 mLs (45 Units total) into the skin at bedtime. For diabetic management 10 mL 0  .  insulin glargine (LANTUS) 100 UNIT/ML injection Inject 0.4 mLs (40 Units total) into the skin at bedtime. 10 mL 11  . lisinopril (PRINIVIL,ZESTRIL) 20 MG tablet Take 1 tablet (20 mg total) by mouth daily. For high blood pressure 30 tablet 0  . metFORMIN (GLUCOPHAGE) 1000 MG tablet Take 1 tablet (1,000 mg total) by mouth 2 (two) times daily with a meal. For diabetes management 60 tablet 0  . montelukast (SINGULAIR) 10 MG tablet Take 1 tablet (10 mg total) by mouth at bedtime. For shortness of breath 30 tablet 0  . nicotine polacrilex (NICORETTE) 2 MG gum Take 1 each (2 mg total) by mouth as needed for smoking cessation. 100 tablet 0  . pantoprazole (PROTONIX) 40 MG tablet Take 1 tablet (40 mg total) by mouth daily. For acid reflux 30 tablet 0  . polyethylene glycol (MIRALAX / GLYCOLAX) packet Take 17 g by mouth daily. For constipation 14 each 0  . propranolol (INDERAL) 20 MG tablet Take 1 tablet (20 mg total) by mouth 2 (two) times daily. For high blood pressure 60 tablet 0  . simvastatin (ZOCOR) 20 MG tablet Take 1 tablet (20 mg total) by mouth daily at 6 PM. For high Cholesterol 30 tablet 0  . temazepam (RESTORIL) 7.5 MG capsule Take 1 capsule (7.5 mg total) by mouth at bedtime. For sleep 14 capsule 0    Results for orders placed or performed during the  hospital encounter of 01/19/17 (from the past 48 hour(s))  Glucose, capillary     Status: Abnormal   Collection Time: 01/30/17 11:20 AM  Result Value Ref Range   Glucose-Capillary 216 (H) 65 - 99 mg/dL  Glucose, capillary     Status: Abnormal   Collection Time: 01/30/17  8:56 PM  Result Value Ref Range   Glucose-Capillary 302 (H) 65 - 99 mg/dL  Glucose, capillary     Status: Abnormal   Collection Time: 01/31/17  7:11 AM  Result Value Ref Range   Glucose-Capillary 280 (H) 65 - 99 mg/dL  Glucose, capillary     Status: Abnormal   Collection Time: 01/31/17 11:29 AM  Result Value Ref Range   Glucose-Capillary 182 (H) 65 - 99 mg/dL  Glucose,  capillary     Status: Abnormal   Collection Time: 01/31/17  4:04 PM  Result Value Ref Range   Glucose-Capillary 107 (H) 65 - 99 mg/dL  Glucose, capillary     Status: Abnormal   Collection Time: 01/31/17  9:02 PM  Result Value Ref Range   Glucose-Capillary 292 (H) 65 - 99 mg/dL  Glucose, capillary     Status: Abnormal   Collection Time: 02/01/17  5:20 AM  Result Value Ref Range   Glucose-Capillary 237 (H) 65 - 99 mg/dL   No results found.  Review of Systems  Constitutional: Negative.   HENT: Negative.   Eyes: Negative.   Respiratory: Negative.   Cardiovascular: Negative.   Gastrointestinal: Negative.   Musculoskeletal: Negative.   Skin: Negative.   Neurological: Negative.   Psychiatric/Behavioral: Positive for depression, hallucinations and memory loss. Negative for substance abuse and suicidal ideas. The patient has insomnia. The patient is not nervous/anxious.     Blood pressure 112/80, pulse 65, temperature 98.3 F (36.8 C), temperature source Oral, resp. rate 16, height 6' (1.829 m), weight 78.5 kg (173 lb), SpO2 99 %. Physical Exam  Nursing note and vitals reviewed. Constitutional: He appears well-developed and well-nourished.  HENT:  Head: Normocephalic and atraumatic.  Eyes: Pupils are equal, round, and reactive to light. Conjunctivae are normal.  Neck: Normal range of motion.  Cardiovascular: Normal rate, regular rhythm and normal heart sounds.   Respiratory: Effort normal and breath sounds normal. No respiratory distress.  GI: Soft.  Musculoskeletal: Normal range of motion.  Neurological: He is alert.  Skin: Skin is warm and dry.  Psychiatric: His mood appears anxious. His speech is delayed. He is slowed and withdrawn. Cognition and memory are impaired. He expresses inappropriate judgment. He exhibits a depressed mood. He expresses suicidal ideation.     Assessment/Plan ECT today with continued 3 times a week follow-up as we try to improve outcome and get him  well enough to leave the hospital  Mordecai Rasmussen, MD 02/01/2017, 10:16 AM

## 2017-02-01 NOTE — Progress Notes (Signed)
Received Mando this am before ECT, he was compliant with his hypertensive medications. He received ECT and returned alert and oriented x4. He remained compliant with his medications throughout the day. He endorsed intervals of anxiety, but no requests of medication. He is OOB in the day room this PM and his affect is brighter.

## 2017-02-01 NOTE — Plan of Care (Signed)
Problem: Coping: Goal: Ability to cope will improve Outcome: Progressing His affect is brighter this PM after ECT and napping all morning. He is OOB in the day room reading a magazine.

## 2017-02-01 NOTE — [facility] Group Notes (Signed)
[facility] Group Notes:  (Nursing/MHT/Case Management/Adjunct)  Date:  02/01/2017  Time:  10:28 PM  Type of Therapy:  Group Therapy  Participation Level:  Active  Participation Quality:  Appropriate  Affect:  Appropriate  Cognitive:  Appropriate  Insight:  Appropriate  Engagement in Group:  Engaged  Modes of Intervention:  Discussion  Summary of Progress/Problems:  Tony Cunningham 02/01/2017, 10:28 PM

## 2017-02-01 NOTE — Procedures (Signed)
[facility] SERVICES Physician's Interval Evaluation & Treatment Note  Patient Identification: Tony Cunningham MRN:  [id number] Date of Evaluation:  02/01/2017 TX #: [id number]  MADRS:   MMSE:   P.E. Findings:  No change to physical exam  Psychiatric Interval Note:  Continues to be down and depressed flat passive suicidal ideation  Subjective:  Patient is a 41 y.o. male seen for evaluation for Electroconvulsive Therapy. He feels he is slightly more active this weekend  Treatment Summary:   []   Right Unilateral             [x]  Bilateral   % Energy : 1.0 ms 60%   Impedance: 1540 ohms  Seizure Energy Index: 20,471 V squared  Postictal Suppression Index: 25%  Seizure Concordance Index: 49%  Medications  Pre Shock: Brevital 80 mg succinylcholine 100 mg  Post Shock: Versed 4 mg Haldol 5 mg  Seizure Duration: 27 seconds by EMG 32 seconds by EEG   Comments: I'm going to increase stimulus to 90% for next time   Lungs:  [x]   Clear to auscultation               []  Other:   Heart:    [x]   Regular rhythm             []  irregular rhythm    [x]   Previous H&P reviewed, patient examined and there are NO CHANGES                 []   Previous H&P reviewed, patient examined and there are changes noted.   , MD 9/10/201810:17 AM

## 2017-02-01 NOTE — Progress Notes (Signed)
No changes in patient status from time of last progress note. No changes in discharge planning. Patient is currently resting in bed with no disturbances noted. Writer will collect BS in preparation for ECT later this morning.

## 2017-02-02 ENCOUNTER — Other Ambulatory Visit: Payer: Self-pay | Admitting: Psychiatry

## 2017-02-02 LAB — GLUCOSE, CAPILLARY
GLUCOSE-CAPILLARY: 191 mg/dL — AB (ref 65–99)
GLUCOSE-CAPILLARY: 225 mg/dL — AB (ref 65–99)
GLUCOSE-CAPILLARY: 134 mg/dL — AB (ref 65–99)
GLUCOSE-CAPILLARY: 240 mg/dL — AB (ref 65–99)
GLUCOSE-CAPILLARY: 189 mg/dL — AB (ref 65–99)
Glucose-Capillary: 47 mg/dL — ABNORMAL LOW (ref 65–99)

## 2017-02-02 NOTE — Progress Notes (Signed)
Hypoglycemic Event  CBG: 49  Treatment: 1 cup of orange juice and lunch tray given.  Symptoms: Patient complained of feeling weak and shaky. Patient diaphoretic.  Follow-up CBG: Time: 1145 CBG Result: 137  Possible Reasons for Event:  Decreased food intake this morning. Ate < 50% of meal.  Comments/MD notified: Dr. Toni Amend notified, Do not give insulin.    Tony Cunningham

## 2017-02-02 NOTE — Plan of Care (Signed)
Problem: Activity: Goal: Sleeping patterns will improve Outcome: Progressing Patient slept for Estimated Hours of 6; Precautionary checks every 15 minutes for safety maintained, room free of safety hazards, patient sustains no injury or falls during this shift.    

## 2017-02-02 NOTE — Progress Notes (Signed)
BDaily contact with Tony Cunningham to assess and evaluate symptoms and progress in treatment, Medication management and Plan [facility] MD Progress Note  02/02/2017 8:02 PM Tony Cunningham  MRN:  [id number] Subjective:  41 year old man with a history of schizoaffective disorder who came back into the hospital only shortly after his most recent discharge this time reporting hallucinations and suicidal ideation. On evaluation today Tony Cunningham is in bed staying withdrawn and hardly ever attending groups. He tells me his mood is very sad and feels frightened. He says he's having auditory hallucinations and active wishes that he were dead. He is not acting on any of these and is not aggressive. Tony Cunningham has been compliant with medication. He admits to me that he really would rather stay in the hospital forever and does not really ever want to be placed in a group home or any other kind of even semi-independent living situation.  Follow-up for Thursday the 30th. Tony Cunningham seen chart reviewed. This 41 year old man with chronic severe mental health problems and failure to transition to outpatient care is once again complaining of hallucinations. He is withdrawn most of the time. Minimal interaction with other people. Denies active suicidal thoughts. Hardly ever gets around other people though. Tolerating medicines well. His blood sugars are reasonably stable although he once again had an over 300 reading this evening.  Follow-up for Friday the 31st. Tony Cunningham had ECT bilateral treatment this morning which was well tolerated. Seen this afternoon in treatment team he had no new complaint. Continues to describe himself as very depressed. Continues to report suicidal thoughts and hallucinations. Rarely gets out of his bed and attends almost no groups or therapeutic activities. Blood sugars remained somewhat labile with an elevated sugar this morning.  01/23/17 - Tony Cunningham reports that he continues to have monitoring hallucinations which are  troublesome and distressing. The voices make him feel suicidal. Spent most of the day in bed, saw him walking around in the evening. Denies active plan but does endorse that he has transient thoughts of suicide.  01/24/17 - Tony Cunningham reports that he feels "so-so". Continues to have monitoring hallucinations although not as severe as yesterday. Endorses that he had a good night's rest. I tried to clarify the dosing and duration of clozapine from him but he states that he does not know.  Follow-up for September 3. Tony Cunningham still lying in bed all day. He tells me that he still hears voices but they are "less". He can't really quantify that. Denies active suicidal thoughts. Admits that he worries constantly about how he will function outside of the hospital. Chart reviewed. I note that the last clozapine level that came back was in the middle of last month and was well into the therapeutic range.  All up for September 5. Tony Cunningham seen. Chart reviewed. He had ECT today which went well as usual. He continues to complain of feeling confused and of feeling very anxious. Says he has racing thoughts and suicidal thoughts. Stays very withdrawn in his bed.  Follow-up for Thursday the sixth. Tony Cunningham remains bedbound almost constantly. He speaks very little. Continues to report having "racing thoughts" and feeling frightened. Affect flat and blunted. Unable to articulate much more detail than that. Poor self-care although it looks like his sugars are starting to creep up.  Follow-up note for Friday the seventh. Tony Cunningham had ECT treatment again today which as usual was tolerated without difficulty. Tony Cunningham continues to report that he feels sad and frightened and anxious. He says he still has some suicidal  thoughts but has no current plan to act on it. He says the hallucinations are getting a little better. Although he only has partial insight into it staff have noticed that he is a little bit more interactive and his affect a  little more appropriate. His hygiene has improved. Blood sugars remain labile.  01/30/2017. Tony Cunningham is feeling "so so". He is in bed this morning but for the past two days he was visible on the unit in the afternoon with brighter affect and a little smile. When better, he approaches me for small conversation. He went outside yesterday. He accepts medications and tolerates them well. He seems more hopeful about the future.  01/31/2017. Tony Cunningham has been more visible in the milieu this weekend. He came to group and participated in activities outside yesterday. Today in the morning however, he doubts that any progress has been made. No somatic complaints. Tolerates teatment well.  Follow-up for the 10th. Tony Cunningham continues to be a little bit more visible. Affect is still generally flat and depressed. Treatment team discussed the Tony Cunningham today. Tony Cunningham has been confronted and advised that he really needs to commit himself to making an effort to stay outside the hospital at some point. Tony Cunningham is seeming to make a little more an effort. Had ECT again today. Tolerating medicines well. Blood sugars fairly stable  Follow-up for Tuesday the 11th. Tony Cunningham seen chart reviewed. He was up out of bed walking around the unit today. He tells me that he is a little bit better. I talked with nurses about how he has variable presentation of his symptoms sometimes from minute to minute. He denied to me any suicidal thoughts but still says he feels confused a lot of the time. Tolerating ECT and medicine well.  Per nursing: Tony Cunningham was visible in the day area and social with select peers. Per report he has been outside for group activity. Tony Cunningham states he attends daily group meetings.   Principal Problem: Schizoaffective disorder, depressive type (HCC) Diagnosis:   Tony Cunningham Active Problem List   Diagnosis Date Noted  . 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 Schizoaffective disorder, depressive type  (HCC) [F25.1] 09/23/2016  . Polysubstance abuse [F19.10] 07/17/2016  . Tardive dyskinesia [G24.01] 07/16/2016  . Tobacco use disorder [F17.200] 07/07/2016  . Dyslipidemia [E78.5] 07/07/2016  . Asthma [J45.909] 07/07/2016  . HTN (hypertension) [I10] 07/06/2016  . Diabetes (HCC) [E11.9] 12/25/2010   Total Time spent with Tony Cunningham: 30 minutes  Past Psychiatric History: Tony Cunningham has a long-standing history of mental illness but over the last year or so seems to of decompensated more than usual and has had one hospitalization after another. He did show some response to ECT and antipsychotic medication but seems to decompensate immediately whenever he is discharged.  Past Medical History:  Past Medical History:  Diagnosis Date  . Anxiety   . Asthma   . Diabetes mellitus   . High blood pressure   . Sinus complaint    History reviewed. No pertinent surgical history. Family History: History reviewed. No pertinent family history. Family Psychiatric  History: Unknown Social History:  History  Alcohol Use No     History  Drug Use No    Social History   Social History  . Marital status: Single    Spouse name: [name]  . Number of children: [name]  . Years of education: [name]   Social History Main Topics  . Smoking status: Current Every Day Smoker    Packs/day: 0.50  Types: Cigarettes  . Smokeless tobacco: Never Used  . Alcohol use No  . Drug use: No  . Sexual activity: Not Currently   Other Topics Concern  . None   Social History Narrative  . None   Additional Social History:                         Sleep: Fair  Appetite:  Poor  Current Medications: Current Facility-Administered Medications  Medication Dose Route Frequency Provider Last Rate Last Dose  . acetaminophen (TYLENOL) tablet 650 mg  650 mg Oral Q6H PRN [name], [name], MD   650 mg at 01/22/17 1247  . alum & mag hydroxide-simeth (MAALOX/MYLANTA) 200-200-20 MG/5ML suspension 30 mL  30 mL Oral Q4H PRN  [name], [name], MD      . cloZAPine (CLOZARIL) tablet 150 mg  150 mg Oral QHS [name], [name], MD   150 mg at 02/01/17 2113  . feeding supplement (ENSURE ENLIVE) (ENSURE ENLIVE) liquid 237 mL  237 mL Oral TID BM [name], [name], MD   237 mL at 02/02/17 1501  . FLUoxetine (PROZAC) capsule 40 mg  40 mg Oral Daily [name], [name], MD   40 mg at 02/02/17 0757  . haloperidol lactate (HALDOL) injection 5 mg  5 mg Intravenous Once [name], [name], MD      . haloperidol lactate (HALDOL) injection 5 mg  5 mg Intravenous Once [name], [name], MD      . insulin aspart (novoLOG) injection 0-5 Units  0-5 Units Subcutaneous QHS [name], [name], MD   4 Units at 02/01/17 2111  . insulin aspart (novoLOG) injection 0-9 Units  0-9 Units Subcutaneous TID WC [name], [name], MD   3 Units at 02/02/17 1730  . insulin aspart (novoLOG) injection 5 Units  5 Units Subcutaneous TID WC [name], [name], MD   5 Units at 02/02/17 1731  . insulin glargine (LANTUS) injection 35 Units  35 Units Subcutaneous QHS [name], [name], MD   35 Units at 02/01/17 2115  . lisinopril (PRINIVIL,ZESTRIL) tablet 20 mg  20 mg Oral Daily [name], [name], MD   20 mg at 02/02/17 0757  . magnesium hydroxide (MILK OF MAGNESIA) suspension 30 mL  30 mL Oral Daily PRN [name], [name], MD      . metFORMIN (GLUCOPHAGE) tablet 1,000 mg  1,000 mg Oral BID WC [name], [name], MD   1,000 mg at 02/02/17 1729  . midazolam (VERSED) injection 2 mg  2 mg Intravenous Once [name], [name], MD      . midazolam (VERSED) injection 4 mg  4 mg Intravenous Once [name], [name], MD      . montelukast (SINGULAIR) tablet 10 mg  10 mg Oral QHS [name], [name], MD   10 mg at 02/01/17 2113  . nicotine (NICODERM CQ - dosed in mg/24 hours) patch 21 mg  21 mg Transdermal Daily [name], [name], MD   21 mg at 01/30/17 0841  . pantoprazole (PROTONIX) EC tablet 40 mg  40 mg Oral Daily [name], [name], MD   40 mg at 02/02/17 0758  . propranolol (INDERAL) tablet 20 mg  20 mg  Oral BID [name], [name], MD   20 mg at 02/02/17 1729    Lab Results:  Results for orders placed or performed during the hospital encounter of 01/19/17 (from the past 48 hour(s))  Glucose, capillary     Status: Abnormal  Collection Time: 01/31/17  9:02 PM  Result Value Ref Range   Glucose-Capillary 292 (H) 65 - 99 mg/dL  Glucose, capillary     Status: Abnormal   Collection Time: 02/01/17  5:20 AM  Result Value Ref Range   Glucose-Capillary 237 (H) 65 - 99 mg/dL  Glucose, capillary     Status: Abnormal   Collection Time: 02/01/17  9:38 AM  Result Value Ref Range   Glucose-Capillary 153 (H) 65 - 99 mg/dL  Glucose, capillary     Status: Abnormal   Collection Time: 02/01/17 10:29 AM  Result Value Ref Range   Glucose-Capillary 193 (H) 65 - 99 mg/dL  Glucose, capillary     Status: Abnormal   Collection Time: 02/01/17 11:31 AM  Result Value Ref Range   Glucose-Capillary 164 (H) 65 - 99 mg/dL  Glucose, capillary     Status: Abnormal   Collection Time: 02/01/17  4:46 PM  Result Value Ref Range   Glucose-Capillary 191 (H) 65 - 99 mg/dL  Glucose, capillary     Status: Abnormal   Collection Time: 02/01/17  9:06 PM  Result Value Ref Range   Glucose-Capillary 323 (H) 65 - 99 mg/dL  Glucose, capillary     Status: Abnormal   Collection Time: 02/02/17  7:05 AM  Result Value Ref Range   Glucose-Capillary 225 (H) 65 - 99 mg/dL  Glucose, capillary     Status: Abnormal   Collection Time: 02/02/17 11:36 AM  Result Value Ref Range   Glucose-Capillary 47 (L) 65 - 99 mg/dL  Glucose, capillary     Status: Abnormal   Collection Time: 02/02/17 11:56 AM  Result Value Ref Range   Glucose-Capillary 134 (H) 65 - 99 mg/dL  Glucose, capillary     Status: Abnormal   Collection Time: 02/02/17  4:32 PM  Result Value Ref Range   Glucose-Capillary 240 (H) 65 - 99 mg/dL    Blood Alcohol level:  Lab Results  Component Value Date   ETH <5 01/16/2017   ETH <5 12/21/2016    Metabolic Disorder  Labs: Lab Results  Component Value Date   HGBA1C 7.6 (H) 01/19/2017   MPG 171.42 01/19/2017   MPG 249 11/09/2016   Lab Results  Component Value Date   PROLACTIN 24.5 (H) 09/24/2016   PROLACTIN 3.4 (L) 07/07/2016   Lab Results  Component Value Date   CHOL 122 01/19/2017   TRIG 154 (H) 01/19/2017   HDL 36 (L) 01/19/2017   CHOLHDL 3.4 01/19/2017   VLDL 31 01/19/2017   LDLCALC 55 01/19/2017   LDLCALC 59 12/28/2016    Physical Findings: AIMS: Facial and Oral Movements Muscles of Facial Expression: None, normal Lips and Perioral Area: None, normal Jaw: None, normal Tongue: None, normal,Extremity Movements Upper (arms, wrists, hands, fingers): Mild Lower (legs, knees, ankles, toes): None, normal, Trunk Movements Neck, shoulders, hips: None, normal, Overall Severity Severity of abnormal movements (highest score from questions above): Minimal Incapacitation due to abnormal movements: None, normal Tony Cunningham's awareness of abnormal movements (rate only Tony Cunningham's report): No Awareness, Dental Status Current problems with teeth and/or dentures?: No Does Tony Cunningham usually wear dentures?: No  CIWA:    COWS:  COWS Total Score: 1  Musculoskeletal: Strength & Muscle Tone: decreased Gait & Station: normal Tony Cunningham leans: [name]  Psychiatric Specialty Exam: Physical Exam  Nursing note and vitals reviewed. Constitutional: He appears well-developed and well-nourished.  HENT:  Head: Normocephalic and atraumatic.  Eyes: Pupils are equal, round, and reactive to light. Conjunctivae are normal.  Neck: Normal range of motion.  Cardiovascular: Regular rhythm and normal heart sounds.   Respiratory: Effort normal and breath sounds normal. No respiratory distress.  GI: Soft.  Musculoskeletal: Normal range of motion.  Neurological: He is alert.  Skin: Skin is warm and dry.  Psychiatric: His affect is blunt. His speech is delayed. He is slowed and withdrawn. Thought content is paranoid. Cognition  and memory are impaired. He expresses impulsivity. He expresses suicidal ideation. He expresses no homicidal ideation.    Review of Systems  Constitutional: Negative.   HENT: Negative.   Eyes: Negative.   Respiratory: Negative.   Cardiovascular: Negative.   Gastrointestinal: Negative.   Musculoskeletal: Negative.   Skin: Negative.   Neurological: Negative.   Psychiatric/Behavioral: Positive for depression, hallucinations, memory loss and suicidal ideas. Negative for substance abuse. The Tony Cunningham is nervous/anxious and has insomnia.     Blood pressure 110/67, pulse 83, temperature 98.2 F (36.8 C), temperature source Oral, resp. rate 18, height 6' (1.829 m), weight 78.5 kg (173 lb), SpO2 100 %.Body mass index is 23.46 kg/m.  General Appearance: Disheveled  Eye Contact:  Fair  Speech:  Slow  Volume:  Decreased  Mood:  Depressed and Dysphoric  Affect:  Constricted and Depressed  Thought Process:  Disorganized  Orientation:  Full (Time, Place, and Person)  Thought Content:  Illogical, Hallucinations: Auditory and Paranoid Ideation  Suicidal Thoughts:  Yes.  without intent/plan  Homicidal Thoughts:  No  Memory:  Immediate;   Good Recent;   Fair Remote;   Fair  Judgement:  Impaired  Insight:  Shallow  Psychomotor Activity:  Decreased  Concentration:  Concentration: Poor  Recall:  Poor  Fund of Knowledge:  Poor  Language:  Fair  Akathisia:  No  Handed:  Right  AIMS (if indicated):     Assets:  Desire for Improvement  ADL's:  Impaired  Cognition:  Impaired,  Mild  Sleep:  Number of Hours: 6   Mr. Polly has a history of schizoaffective disorder admitted for worsening of depression receiving ECT.  #Schizoaffective disorder, SI - Continue ECT - Next one on 02/01/17 - Continue clozapine 150 mg QHS, aim to optimize dose of antipsychotic monotherapy. Level pending and will hold at 150 mg.  - Prozac 40 mg.  HTN. He is on Lisinopril and Propranolol.   #Smoking  cessation Continue nicotine patch 21 mg/24 hours  #DM Continue metformin 1000 mg BIDCC, Lantus 35 units at bedtime, Novolog 5 units before meals, ADA diet. Insulin sliding scale  #GERD Continue protonix EC 40 mg daily.   Disposition. TBE.   We are continuing ECT and medication changes including most recently an increase and change in his antidepressant. Treatment team aware of his multiple hospitalizations in the difficulty finding a safe place outside the hospital is considering referral to the state facility. Meanwhile work on improving current symptoms. Diabetes remains difficult to control. I did slightly increase his insulin yesterday and I don't want him to have a low blood sugar. Encouraged to stay on his regular diet without extra food in between meals. Strongly encouraged to be out of bed and attending therapy groups and interacting with peers and staff.  01/30/2017. No medication adjustments today. 01/31/2017. No medication adjustments today.  No changes to medication. ECT today. Spent time with him before and after treatment discussing the overall plan. Treatment team rounded.  ECT next performed tomorrow. Continue current medicine. Sugars are labile as usual but no changes to medicine today he had some low  sugars earlier today. Continue supportive therapy and working with treatment team on discharge planning  I certify that the services received since the previous certification/recertification were and continue to be medically necessary as the treatment provided can be reasonably expected to improve the Tony Cunningham's condition; the medical record documents that the services furnished were intensive treatment services or their equivalent services, and this Tony Cunningham continues to need, on a daily basis, active treatment furnished directly by or requiring the supervision of inpatient psychiatric personnel.  Mordecai Rasmussen, MD 02/02/2017, 8:02 PM

## 2017-02-02 NOTE — [facility] Group Notes (Signed)
[facility] LCSW Group Therapy Note  Date/Time: 02/02/17, 1500  Type of Therapy/Topic:  Group Therapy:  Feelings about Diagnosis  Participation Level:  Did Not Attend   Mood:   Description of Group:    This group will allow patients to explore their thoughts and feelings about diagnoses they have received. Patients will be guided to explore their level of understanding and acceptance of these diagnoses. Facilitator will encourage patients to process their thoughts and feelings about the reactions of others to their diagnosis, and will guide patients in identifying ways to discuss their diagnosis with significant others in their lives. This group will be process-oriented, with patients participating in exploration of their own experiences as well as giving and receiving support and challenge from other group members.   Therapeutic Goals: 1. Patient will demonstrate understanding of diagnosis as evidence by identifying two or more symptoms of the disorder:  2. Patient will be able to express two feelings regarding the diagnosis 3. Patient will demonstrate ability to communicate their needs through discussion and/or role plays  Summary of Patient Progress:        Therapeutic Modalities:   Cognitive Behavioral Therapy Brief Therapy Feelings Identification   Daleen Squibb, LCSW

## 2017-02-02 NOTE — Progress Notes (Signed)
Patient ID: Tony Cunningham, male   DOB: August 09, 1975, 41 y.o.   MRN: [id number] Rough looking appearance, unkempt condition, poor hygiene, ambulates (rigid, subtle tremors, shuffling gait); mood and affect appropriate, thoughts are inconsistent to mood and affect presented; continues to endorse CAH, "they come and go, but I am not hearing them now.." CBG=[id number], coverage and lantus given as scheduled; complied with medications.

## 2017-02-02 NOTE — Plan of Care (Signed)
Problem: Education: Goal: Ability to make informed decisions regarding treatment will improve Outcome: Not Progressing Patient does not display the ability to make in formed decisions regarding treatment.  Problem: Medication: Goal: Compliance with prescribed medication regimen will improve Outcome: Progressing Patient is compliant with his medication regimen.  Problem: Self-Concept: Goal: Ability to verbalize positive feelings about self will improve Outcome: Progressing Patient has the ability to verbalize feelings about self.  Problem: Activity: Goal: Interest or engagement in leisure activities will improve Outcome: Not Progressing Patient does not engage in the leisure activities.  Problem: Education: Goal: Knowledge of the prescribed therapeutic regimen will improve Outcome: Not Progressing Patient  is not knowledgeable of his prescribed therapeutic regimen.  Problem: Coping: Goal: Ability to verbalize feelings will improve Outcome: Progressing Patient is able to verbalize feelings to staff.

## 2017-02-02 NOTE — Progress Notes (Signed)
Patient up on the unit. He is pleasant and cooperative. He is med compliant. He ate all of his supper.   No signs of distress noted. Will continue to monitor.

## 2017-02-02 NOTE — [facility] Group Notes (Signed)
[facility] Group Notes:  (Nursing/MHT/Case Management/Adjunct)  Date:  02/02/2017  Time:  9:31 PM  Type of Therapy:  Group Therapy\  Participation Level:  Active  Participation Quality:  Appropriate  Affect:  Appropriate  Cognitive:  Alert  Insight:  Good  Engagement in Group:  Engaged  Modes of Intervention:  Support  Summary of Progress/Problems:  Tony Cunningham 02/02/2017, 9:31 PM

## 2017-02-02 NOTE — Progress Notes (Signed)
Pt denies SI but endorses "soft voices heard in his head", up ambulating throughout shift with slow steady gait. Patient is pleasant and cooperative with treatment care.  Pt appears less anxious and he is interacting with peers and staff appropriately. Patient is alert and oriented x 3 with some confusion noted at times to situation.    Blood sugar and blood pressure monitored and medicated per protocol. Milieu remains therapeutic. Patient will be monitored and physician notified of any acute changes.

## 2017-02-03 ENCOUNTER — Inpatient Hospital Stay: Payer: Medicare Other | Admitting: Anesthesiology

## 2017-02-03 ENCOUNTER — Encounter: Payer: Self-pay | Admitting: Anesthesiology

## 2017-02-03 LAB — GLUCOSE, CAPILLARY
Glucose-Capillary: 150 mg/dL — ABNORMAL HIGH (ref 65–99)
Glucose-Capillary: 205 mg/dL — ABNORMAL HIGH (ref 65–99)
Glucose-Capillary: 197 mg/dL — ABNORMAL HIGH (ref 65–99)
Glucose-Capillary: 278 mg/dL — ABNORMAL HIGH (ref 65–99)
Glucose-Capillary: 159 mg/dL — ABNORMAL HIGH (ref 65–99)

## 2017-02-03 MED ORDER — KETOROLAC TROMETHAMINE 30 MG/ML IJ SOLN
30.0000 mg | Freq: Once | INTRAMUSCULAR | Status: AC
  Administered 2017-02-03: 30 mg via INTRAVENOUS

## 2017-02-03 MED ORDER — MIDAZOLAM HCL 2 MG/2ML IJ SOLN
INTRAMUSCULAR | Status: AC
  Filled 2017-02-03: qty 4

## 2017-02-03 MED ORDER — GLUCERNA SHAKE PO LIQD
237.0000 mL | Freq: Three times a day (TID) | ORAL | Status: DC
  Administered 2017-02-03 – 2017-02-10 (×20): 237 mL via ORAL

## 2017-02-03 MED ORDER — KETOROLAC TROMETHAMINE 30 MG/ML IJ SOLN
INTRAMUSCULAR | Status: AC
  Administered 2017-02-03: 16:00:00
  Filled 2017-02-03: qty 1

## 2017-02-03 MED ORDER — METHOHEXITAL SODIUM 100 MG/10ML IV SOSY
PREFILLED_SYRINGE | INTRAVENOUS | Status: DC | PRN
  Administered 2017-02-03: 80 mg via INTRAVENOUS

## 2017-02-03 MED ORDER — SODIUM CHLORIDE 0.9 % IV SOLN
INTRAVENOUS | Status: DC | PRN
  Administered 2017-02-03: 11:00:00 via INTRAVENOUS

## 2017-02-03 MED ORDER — SUCCINYLCHOLINE CHLORIDE 200 MG/10ML IV SOSY
PREFILLED_SYRINGE | INTRAVENOUS | Status: DC | PRN
  Administered 2017-02-03: 100 mg via INTRAVENOUS

## 2017-02-03 MED ORDER — SODIUM CHLORIDE 0.9 % IV SOLN
500.0000 mL | Freq: Once | INTRAVENOUS | Status: AC
  Administered 2017-02-03: 1000 mL via INTRAVENOUS

## 2017-02-03 MED ORDER — HALOPERIDOL LACTATE 5 MG/ML IJ SOLN
INTRAMUSCULAR | Status: AC
  Filled 2017-02-03: qty 1

## 2017-02-03 NOTE — Anesthesia Preprocedure Evaluation (Signed)
Anesthesia Evaluation  Patient identified by MRN, date of birth, ID band Patient awake    Reviewed: Allergy & Precautions, NPO status , Patient's Chart, lab work & pertinent test results  History of Anesthesia Complications Negative for: history of anesthetic complications  Airway Mallampati: II  TM Distance: >3 FB Neck ROM: Full    Dental  (+) Poor Dentition, Chipped   Pulmonary asthma , Current Smoker,    breath sounds clear to auscultation- rhonchi (-) wheezing      Cardiovascular hypertension, Pt. on medications (-) CAD, (-) Past MI and (-) Cardiac Stents  Rhythm:Regular Rate:Normal - Systolic murmurs and - Diastolic murmurs    Neuro/Psych PSYCHIATRIC DISORDERS Anxiety Depression Schizophrenia negative neurological ROS     GI/Hepatic negative GI ROS, Neg liver ROS,   Endo/Other  diabetes, Insulin Dependent  Renal/GU negative Renal ROS     Musculoskeletal negative musculoskeletal ROS (+)   Abdominal (+) - obese,   Peds  Hematology negative hematology ROS (+)   Anesthesia Other Findings Signs and symptoms suggestive of sleep apnea   Past Medical History: No date: Anxiety No date: Asthma No date: Diabetes mellitus No date: High blood pressure No date: Sinus complaint  History reviewed. No pertinent surgical history.   Reproductive/Obstetrics                             Anesthesia Physical  Anesthesia Plan  ASA: III  Anesthesia Plan: General   Post-op Pain Management:    Induction: Intravenous  PONV Risk Score and Plan:   Airway Management Planned: Mask  Additional Equipment:   Intra-op Plan:   Post-operative Plan:   Informed Consent: I have reviewed the patients History and Physical, chart, labs and discussed the procedure including the risks, benefits and alternatives for the proposed anesthesia with the patient or authorized representative who has indicated  his/her understanding and acceptance.   Dental advisory given  Plan Discussed with: CRNA and Anesthesiologist  Anesthesia Plan Comments: (Patient consented for risks of anesthesia including but not limited to:  - adverse reactions to medications - damage to teeth, lips or other oral mucosa - sore throat or hoarseness - Damage to heart, brain, lungs or loss of life  Patient voiced understanding.)        Anesthesia Quick Evaluation

## 2017-02-03 NOTE — Progress Notes (Signed)
Patient ID: Tony Cunningham, male   DOB: 25-Nov-1975, 41 y.o.   MRN: [id number] Visible in the milieu, disheveled, poorly kept appearance, bar soap, lotion provided, encouraged to, at least, wash his face and applied lotion; came back out and asking others "do you like my face? I just washed my face...." Poor insight, impaired thoughts, disorganized, lacks perception of awareness, denied SI/SIB/HI.
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
## 2017-02-03 NOTE — Anesthesia Postprocedure Evaluation (Signed)
Anesthesia Post Note  Patient: Tony Cunningham  Procedure(s) Performed: * No procedures listed *  Patient location during evaluation: PACU Anesthesia Type: General Level of consciousness: awake and alert Pain management: pain level controlled Vital Signs Assessment: post-procedure vital signs reviewed and stable Respiratory status: spontaneous breathing, nonlabored ventilation, respiratory function stable and patient connected to nasal cannula oxygen Cardiovascular status: blood pressure returned to baseline and stable Postop Assessment: no signs of nausea or vomiting Anesthetic complications: no     Last Vitals:  Vitals:   02/03/17 1155 02/03/17 1210  BP: 133/77 120/86  Pulse: 78 74  Resp:  16  Temp:  36.5 C  SpO2: 99% 99%    Last Pain:  Vitals:   02/03/17 1125  TempSrc:   PainSc: Asleep                 Cleda Mccreedy Waverly Tarquinio
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
## 2017-02-03 NOTE — H&P (Signed)
Tony Cunningham is an 41 y.o. male.   Chief Complaint: Patient continues to be depressed and withdrawn and denies active suicidal thoughts. Vague about hallucinations. HPI: Patient with recurrent severe depression. Withdrawn. Receiving ECT and medication management  Past Medical History:  Diagnosis Date  . Anxiety   . Asthma   . Diabetes mellitus   . High blood pressure   . Sinus complaint     History reviewed. No pertinent surgical history.  History reviewed. No pertinent family history. Social History:  reports that he has been smoking Cigarettes.  He has been smoking about 0.50 packs per day. He has never used smokeless tobacco. He reports that he does not drink alcohol or use drugs.  Allergies: No Known Allergies  Medications Prior to Admission  Medication Sig Dispense Refill  . amantadine (SYMMETREL) 100 MG capsule Take 1 capsule (100 mg total) by mouth 2 (two) times daily. For prevention of drug induced tremors 60 capsule 0  . benztropine (COGENTIN) 1 MG tablet Take 1 tablet (1 mg total) by mouth 2 (two) times daily. For prevention of drug induced tremors 60 tablet 0  . cholecalciferol 400 units tablet Take 1 tablet (400 Units total) by mouth daily. For bone health 30 each 0  . citalopram (CELEXA) 20 MG tablet Take 1 tablet (20 mg total) by mouth daily. For depression 30 tablet 0  . cloZAPine (CLOZARIL) 50 MG tablet Take 3 tablets (150 mg total) by mouth at bedtime. For mood control 90 tablet 0  . cyclobenzaprine (FLEXERIL) 5 MG tablet Take 1 tablet (5 mg total) by mouth 3 (three) times daily as needed for muscle spasms. 15 tablet 0  . diclofenac sodium (VOLTAREN) 1 % GEL Apply 2 g topically 4 (four) times daily. For arthritis pain    . insulin aspart (NOVOLOG) 100 UNIT/ML injection Inject 6 Units into the skin 3 (three) times daily with meals. 10 mL 11  . insulin glargine (LANTUS) 100 UNIT/ML injection Inject 0.45 mLs (45 Units total) into the skin at bedtime. For diabetic management  10 mL 0  . insulin glargine (LANTUS) 100 UNIT/ML injection Inject 0.4 mLs (40 Units total) into the skin at bedtime. 10 mL 11  . lisinopril (PRINIVIL,ZESTRIL) 20 MG tablet Take 1 tablet (20 mg total) by mouth daily. For high blood pressure 30 tablet 0  . metFORMIN (GLUCOPHAGE) 1000 MG tablet Take 1 tablet (1,000 mg total) by mouth 2 (two) times daily with a meal. For diabetes management 60 tablet 0  . montelukast (SINGULAIR) 10 MG tablet Take 1 tablet (10 mg total) by mouth at bedtime. For shortness of breath 30 tablet 0  . nicotine polacrilex (NICORETTE) 2 MG gum Take 1 each (2 mg total) by mouth as needed for smoking cessation. 100 tablet 0  . pantoprazole (PROTONIX) 40 MG tablet Take 1 tablet (40 mg total) by mouth daily. For acid reflux 30 tablet 0  . polyethylene glycol (MIRALAX / GLYCOLAX) packet Take 17 g by mouth daily. For constipation 14 each 0  . propranolol (INDERAL) 20 MG tablet Take 1 tablet (20 mg total) by mouth 2 (two) times daily. For high blood pressure 60 tablet 0  . simvastatin (ZOCOR) 20 MG tablet Take 1 tablet (20 mg total) by mouth daily at 6 PM. For high Cholesterol 30 tablet 0  . temazepam (RESTORIL) 7.5 MG capsule Take 1 capsule (7.5 mg total) by mouth at bedtime. For sleep 14 capsule 0    Results for orders placed or performed during  the hospital encounter of 01/19/17 (from the past 48 hour(s))  Glucose, capillary     Status: Abnormal   Collection Time: 02/01/17 11:31 AM  Result Value Ref Range   Glucose-Capillary 164 (H) 65 - 99 mg/dL  Glucose, capillary     Status: Abnormal   Collection Time: 02/01/17  4:46 PM  Result Value Ref Range   Glucose-Capillary 191 (H) 65 - 99 mg/dL  Glucose, capillary     Status: Abnormal   Collection Time: 02/01/17  9:06 PM  Result Value Ref Range   Glucose-Capillary 323 (H) 65 - 99 mg/dL  Glucose, capillary     Status: Abnormal   Collection Time: 02/02/17  7:05 AM  Result Value Ref Range   Glucose-Capillary 225 (H) 65 - 99 mg/dL   Glucose, capillary     Status: Abnormal   Collection Time: 02/02/17 11:36 AM  Result Value Ref Range   Glucose-Capillary 47 (L) 65 - 99 mg/dL  Glucose, capillary     Status: Abnormal   Collection Time: 02/02/17 11:56 AM  Result Value Ref Range   Glucose-Capillary 134 (H) 65 - 99 mg/dL  Glucose, capillary     Status: Abnormal   Collection Time: 02/02/17  4:32 PM  Result Value Ref Range   Glucose-Capillary 240 (H) 65 - 99 mg/dL  Glucose, capillary     Status: Abnormal   Collection Time: 02/02/17  9:09 PM  Result Value Ref Range   Glucose-Capillary 189 (H) 65 - 99 mg/dL   Comment 1 Notify RN   Glucose, capillary     Status: Abnormal   Collection Time: 02/03/17  7:15 AM  Result Value Ref Range   Glucose-Capillary 150 (H) 65 - 99 mg/dL   Comment 1 Notify RN    No results found.  Review of Systems  Constitutional: Negative.   HENT: Negative.   Eyes: Negative.   Respiratory: Negative.   Cardiovascular: Negative.   Gastrointestinal: Negative.   Musculoskeletal: Negative.   Skin: Negative.   Neurological: Negative.   Psychiatric/Behavioral: Positive for depression and memory loss. Negative for hallucinations, substance abuse and suicidal ideas. The patient is nervous/anxious and has insomnia.     Blood pressure 109/80, pulse 71, temperature 98.2 F (36.8 C), temperature source Oral, resp. rate 16, height 6' (1.829 m), weight 78.5 kg (173 lb), SpO2 98 %. Physical Exam  Nursing note and vitals reviewed. Constitutional: He appears well-developed and well-nourished.  HENT:  Head: Normocephalic and atraumatic.  Eyes: Pupils are equal, round, and reactive to light. Conjunctivae are normal.  Neck: Normal range of motion.  Cardiovascular: Regular rhythm and normal heart sounds.   Respiratory: Effort normal. No respiratory distress.  GI: Soft.  Musculoskeletal: Normal range of motion.  Neurological: He is alert.  Skin: Skin is warm and dry.  Psychiatric: His affect is blunt. His  speech is delayed. He is slowed and withdrawn. Cognition and memory are impaired. He expresses impulsivity. He expresses no suicidal ideation.     Assessment/Plan Continue ECT with bilateral treatment. No change to plan.  Mordecai Rasmussen, MD 02/03/2017, 11:09 AM

## 2017-02-03 NOTE — Progress Notes (Signed)
Patient had ECT today returned no issues. He denies si, hi, avh. He has to be encouraged to get out of bed and get meals and meds. He is compliant with meds. He does make eye contact but briefly. He is pleasant and slow to respond. Will continue to monitor.
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
## 2017-02-03 NOTE — Anesthesia Procedure Notes (Signed)
Date/Time: 02/03/2017 11:16 AM Performed by: Lily Kocher Pre-anesthesia Checklist: Patient identified, Emergency Drugs available, Suction available and Patient being monitored Patient Re-evaluated:Patient Re-evaluated prior to induction Oxygen Delivery Method: Circle system utilized Preoxygenation: Pre-oxygenation with 100% oxygen Induction Type: IV induction Ventilation: Mask ventilation without difficulty and Mask ventilation throughout procedure Airway Equipment and Method: Bite block Placement Confirmation: positive ETCO2 Dental Injury: Teeth and Oropharynx as per pre-operative assessment

## 2017-02-03 NOTE — Transfer of Care (Signed)
Immediate Anesthesia Transfer of Care Note  Patient: Tony Cunningham  Procedure(s) Performed: ECT  Patient Location: PACU  Anesthesia Type:General  Level of Consciousness: sedated  Airway & Oxygen Therapy: Patient Spontanous Breathing and Patient connected to face mask oxygen  Post-op Assessment: Report given to RN and Post -op Vital signs reviewed and stable  Post vital signs: Reviewed and stable  Last Vitals:  Vitals:   02/03/17 0636 02/03/17 0927  BP: 120/84 109/80  Pulse:  71  Resp:  16  Temp:  36.8 C  SpO2:  98%    Last Pain:  Vitals:   02/03/17 0927  TempSrc: Oral  PainSc: 4       Patients Stated Pain Goal: 0 (02/03/17 0927)  Complications: No apparent anesthesia complications

## 2017-02-03 NOTE — Progress Notes (Signed)
Patient ID: Tony Cunningham, male   DOB: 12-Aug-1975, 41 y.o.   MRN: [id number] ECT Prep Checklist: 1. Patient is NPO after midnight.  2. Pre-CBG level 3. Will hold insulin the morning of the procedure. 4. Oncoming nursing staffs will assist the patient with personal hygiene the morning of treatment including brushing his teeth 5. Pre-ECT Betablocker (Lisinopril and Propanolol 20 mg each medication) will be given

## 2017-02-03 NOTE — Anesthesia Post-op Follow-up Note (Signed)
Anesthesia QCDR form completed.        

## 2017-02-03 NOTE — Plan of Care (Signed)
Problem: Activity: Goal: Sleeping patterns will improve Outcome: Progressing Patient slept for Estimated Hours of 5.15; Precautionary checks every 15 minutes for safety maintained, room free of safety hazards, patient sustains no injury or falls during this shift.

## 2017-02-04 LAB — GLUCOSE, CAPILLARY
GLUCOSE-CAPILLARY: 193 mg/dL — AB (ref 65–99)
GLUCOSE-CAPILLARY: 305 mg/dL — AB (ref 65–99)
Glucose-Capillary: 152 mg/dL — ABNORMAL HIGH (ref 65–99)
Glucose-Capillary: 388 mg/dL — ABNORMAL HIGH (ref 65–99)

## 2017-02-04 NOTE — Progress Notes (Signed)
Patient ID: Tony Cunningham, male   DOB: 02/03/1976, 42 y.o.   MRN: [id number] Disheveled, malodorous, dirty, poor hygiene, writer assisted to shave patient and encouraged patient to shower; patient came back out with shaving cream still on his face after claiming that he showered. He was sent back to wash it off before snacks and medications. Rigid, disorganized, concrete thinking, stated that the ECT went well "it was good.Marland Kitchen"

## 2017-02-04 NOTE — Plan of Care (Signed)
Problem: Safety: Goal: Periods of time without injury will increase Outcome: Progressing Pt remains safe while in hospital injury free.    

## 2017-02-04 NOTE — Plan of Care (Signed)
Problem: Activity: Goal: Sleeping patterns will improve Outcome: Progressing Patient slept for Estimated Hours of 7; Precautionary checks every 15 minutes for safety maintained, room free of safety hazards, patient sustains no injury or falls during this shift.    

## 2017-02-04 NOTE — [facility] Group Notes (Signed)
[facility] Group Notes:  (Nursing/MHT/Case Management/Adjunct)  Date:  02/04/2017  Time:  4:17 PM  Type of Therapy:  Psychoeducational Skills  Participation Level:  Did Not Attend  Twanna Hy 02/04/2017, 4:17 PM

## 2017-02-04 NOTE — Progress Notes (Signed)
Pt refused 1200 insulin stated he did not want to get up out of bed and he wanted to sleep.

## 2017-02-04 NOTE — Progress Notes (Signed)
CSW spoke to [place], [place].  She has made the referral for [facility] and it has been confirmed that they do have an opening.  CSW updated her on possible discharge middle to end of next week and that mother is willing to take pt back. Garner Nash, MSW, LCSW Clinical Social Worker 02/04/2017 11:22 AM

## 2017-02-04 NOTE — [facility] Group Notes (Signed)
[facility] LCSW Group Therapy Note  Date/Time: 02/04/17, 1300  Type of Therapy/Topic:  Group Therapy:  Balance in Life  Participation Level:  minimal  Description of Group:    This group will address the concept of balance and how it feels and looks when one is unbalanced. Patients will be encouraged to process areas in their lives that are out of balance, and identify reasons for remaining unbalanced. Facilitators will guide patients utilizing problem- solving interventions to address and correct the stressor making their life unbalanced. Understanding and applying boundaries will be explored and addressed for obtaining  and maintaining a balanced life. Patients will be encouraged to explore ways to assertively make their unbalanced needs known to significant others in their lives, using other group members and facilitator for support and feedback.  Therapeutic Goals: 1. Patient will identify two or more emotions or situations they have that consume much of in their lives. 2. Patient will identify signs/triggers that life has become out of balance:  3. Patient will identify two ways to set boundaries in order to achieve balance in their lives:  4. Patient will demonstrate ability to communicate their needs through discussion and/or role plays  Summary of Patient Progress: Pt was attentive and appropriate in group.  Pt had some limited participation in the discussion but did attempt to do so.            Therapeutic Modalities:   Cognitive Behavioral Therapy Solution-Focused Therapy Assertiveness Training  Daleen Squibb, [place]

## 2017-02-04 NOTE — Progress Notes (Signed)
BDaily contact with patient to assess and evaluate symptoms and progress in treatment, Medication management and Plan [facility] MD Progress Note  02/04/2017 4:48 PM Tony Cunningham  MRN:  [id number] Subjective:  41 year old man with a history of schizoaffective disorder who came back into the hospital only shortly after his most recent discharge this time reporting hallucinations and suicidal ideation. On evaluation today patient is in bed staying withdrawn and hardly ever attending groups. He tells me his mood is very sad and feels frightened. He says he's having auditory hallucinations and active wishes that he were dead. He is not acting on any of these and is not aggressive. Patient has been compliant with medication. He admits to me that he really would rather stay in the hospital forever and does not really ever want to be placed in a group home or any other kind of even semi-independent living situation.  Follow-up for Thursday the 30th. Patient seen chart reviewed. This 41 year old man with chronic severe mental health problems and failure to transition to outpatient care is once again complaining of hallucinations. He is withdrawn most of the time. Minimal interaction with other people. Denies active suicidal thoughts. Hardly ever gets around other people though. Tolerating medicines well. His blood sugars are reasonably stable although he once again had an over 300 reading this evening.  Follow-up for Friday the 31st. Patient had ECT bilateral treatment this morning which was well tolerated. Seen this afternoon in treatment team he had no new complaint. Continues to describe himself as very depressed. Continues to report suicidal thoughts and hallucinations. Rarely gets out of his bed and attends almost no groups or therapeutic activities. Blood sugars remained somewhat labile with an elevated sugar this morning.  01/23/17 - patient reports that he continues to have monitoring hallucinations which are  troublesome and distressing. The voices make him feel suicidal. Spent most of the day in bed, saw him walking around in the evening. Denies active plan but does endorse that he has transient thoughts of suicide.  01/24/17 - patient reports that he feels "so-so". Continues to have monitoring hallucinations although not as severe as yesterday. Endorses that he had a good night's rest. I tried to clarify the dosing and duration of clozapine from him but he states that he does not know.  Follow-up for September 3. Patient still lying in bed all day. He tells me that he still hears voices but they are "less". He can't really quantify that. Denies active suicidal thoughts. Admits that he worries constantly about how he will function outside of the hospital. Chart reviewed. I note that the last clozapine level that came back was in the middle of last month and was well into the therapeutic range.  All up for September 5. Patient seen. Chart reviewed. He had ECT today which went well as usual. He continues to complain of feeling confused and of feeling very anxious. Says he has racing thoughts and suicidal thoughts. Stays very withdrawn in his bed.  Follow-up for Thursday the sixth. Patient remains bedbound almost constantly. He speaks very little. Continues to report having "racing thoughts" and feeling frightened. Affect flat and blunted. Unable to articulate much more detail than that. Poor self-care although it looks like his sugars are starting to creep up.  Follow-up note for Friday the seventh. Patient had ECT treatment again today which as usual was tolerated without difficulty. Patient continues to report that he feels sad and frightened and anxious. He says he still has some suicidal  thoughts but has no current plan to act on it. He says the hallucinations are getting a little better. Although he only has partial insight into it staff have noticed that he is a little bit more interactive and his affect a  little more appropriate. His hygiene has improved. Blood sugars remain labile.  01/30/2017. Tony Cunningham is feeling "so so". He is in bed this morning but for the past two days he was visible on the unit in the afternoon with brighter affect and a little smile. When better, he approaches me for small conversation. He went outside yesterday. He accepts medications and tolerates them well. He seems more hopeful about the future.  01/31/2017. Tony Cunningham has been more visible in the milieu this weekend. He came to group and participated in activities outside yesterday. Today in the morning however, he doubts that any progress has been made. No somatic complaints. Tolerates teatment well.  Follow-up for the 10th. Patient continues to be a little bit more visible. Affect is still generally flat and depressed. Treatment team discussed the patient today. Patient has been confronted and advised that he really needs to commit himself to making an effort to stay outside the hospital at some point. Patient is seeming to make a little more an effort. Had ECT again today. Tolerating medicines well. Blood sugars fairly stable  Follow-up for Tuesday the 11th. Patient seen chart reviewed. He was up out of bed walking around the unit today. He tells me that he is a little bit better. I talked with nurses about how he has variable presentation of his symptoms sometimes from minute to minute. He denied to me any suicidal thoughts but still says he feels confused a lot of the time. Tolerating ECT and medicine well.  Follow-up for Wednesday the 12th. Once again had ECT today which was tolerated well without any difficulty. Patient continues to be a little hard to draw out. His report of symptoms is somewhat inconsistent. Sometimes he will talk about having hallucinations at other times denying that same thing with suicidal thoughts. Overall he has not shown any acute dangerous behavior. He seems to be passively cooperative and tolerating  medicine. Spoke with treatment team today and we have confirm that the mother would be agreeable to taking him home after discharge.  Follow-up for Thursday the 13th. Patient was out of his room and was actually participating in recreational activities. He had been shooting baskets earlier and was sitting with a group of peers staying awake and somewhat interactive outdoors. Patient says he feels "weird" but can't really define what that means. He is not reporting acute suicidality. Still appears to be anxious and a little withdrawn. We reviewed his progress with the ECT. The patient seems to be accepting the idea of possible discharge next week.  Per nursing: Patient was visible in the day area and social with select peers. Per report he has been outside for group activity. Patient states he attends daily group meetings.   Principal Problem: Schizoaffective disorder, depressive type (HCC) Diagnosis:   Patient Active Problem List   Diagnosis Date Noted  . 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 Schizoaffective disorder, depressive type (HCC) [F25.1] 09/23/2016  . Polysubstance abuse [F19.10] 07/17/2016  . Tardive dyskinesia [G24.01] 07/16/2016  . Tobacco use disorder [F17.200] 07/07/2016  . Dyslipidemia [E78.5] 07/07/2016  . Asthma [J45.909] 07/07/2016  . HTN (hypertension) [I10] 07/06/2016  . Diabetes (HCC) [E11.9] 12/25/2010   Total Time spent with patient: 30 minutes  Past Psychiatric History: Patient has a long-standing history of mental illness but over the last year or so seems to of decompensated more than usual and has had one hospitalization after another. He did show some response to ECT and antipsychotic medication but seems to decompensate immediately whenever he is discharged.  Past Medical History:  Past Medical History:  Diagnosis Date  . Anxiety   . Asthma   . Diabetes mellitus   . High blood pressure   . Sinus complaint    History reviewed. No  pertinent surgical history. Family History: History reviewed. No pertinent family history. Family Psychiatric  History: Unknown Social History:  History  Alcohol Use No     History  Drug Use No    Social History   Social History  . Marital status: Single    Spouse name: [name]  . Number of children: [name]  . Years of education: [name]   Social History Main Topics  . Smoking status: Current Every Day Smoker    Packs/day: 0.50    Types: Cigarettes  . Smokeless tobacco: Never Used  . Alcohol use No  . Drug use: No  . Sexual activity: Not Currently   Other Topics Concern  . None   Social History Narrative  . None   Additional Social History:                         Sleep: Fair  Appetite:  Poor  Current Medications: Current Facility-Administered Medications  Medication Dose Route Frequency Provider Last Rate Last Dose  . acetaminophen (TYLENOL) tablet 650 mg  650 mg Oral Q6H PRN Scotty Weigelt, [name], MD   650 mg at 01/22/17 1247  . alum & mag hydroxide-simeth (MAALOX/MYLANTA) 200-200-20 MG/5ML suspension 30 mL  30 mL Oral Q4H PRN Keagan Brislin T, MD      . cloZAPine (CLOZARIL) tablet 150 mg  150 mg Oral QHS Aleph Nickson, [name], MD   150 mg at 02/03/17 2110  . feeding supplement (GLUCERNA SHAKE) (GLUCERNA SHAKE) liquid 237 mL  237 mL Oral TID BM Enid Maultsby T, MD   237 mL at 02/04/17 1412  . FLUoxetine (PROZAC) capsule 40 mg  40 mg Oral Daily Sasha Rueth T, MD   40 mg at 02/04/17 0743  . haloperidol lactate (HALDOL) injection 5 mg  5 mg Intravenous Once Ilsa Bonello T, MD      . insulin aspart (novoLOG) injection 0-5 Units  0-5 Units Subcutaneous QHS Golden Gilreath, [name], MD   4 Units at 02/01/17 2111  . insulin aspart (novoLOG) injection 0-9 Units  0-9 Units Subcutaneous TID WC Kaily Wragg, [name], MD   7 Units at 02/04/17 1640  . insulin aspart (novoLOG) injection 5 Units  5 Units Subcutaneous TID WC Adonay Scheier, [name], MD   5 Units at 02/04/17 1639  . insulin glargine (LANTUS)  injection 35 Units  35 Units Subcutaneous QHS Wilkie Zenon, [name], MD   35 Units at 02/03/17 2111  . lisinopril (PRINIVIL,ZESTRIL) tablet 20 mg  20 mg Oral Daily Lake Breeding, [name], MD   20 mg at 02/04/17 0744  . magnesium hydroxide (MILK OF MAGNESIA) suspension 30 mL  30 mL Oral Daily PRN Georgi Navarrete T, MD      . metFORMIN (GLUCOPHAGE) tablet 1,000 mg  1,000 mg Oral BID WC Rhylei Mcquaig, [name], MD   1,000 mg at 02/04/17 1639  . midazolam (VERSED) injection 2 mg  2 mg Intravenous Once Surya Schroeter T,  MD      . montelukast (SINGULAIR) tablet 10 mg  10 mg Oral QHS Avish Torry, [name], MD   10 mg at 02/03/17 2110  . nicotine (NICODERM CQ - dosed in mg/24 hours) patch 21 mg  21 mg Transdermal Daily [name], [name], MD   21 mg at 01/30/17 0841  . pantoprazole (PROTONIX) EC tablet 40 mg  40 mg Oral Daily Liddy Deam, [name], MD   40 mg at 02/04/17 0744  . propranolol (INDERAL) tablet 20 mg  20 mg Oral BID Treysen Sudbeck, [name], MD   20 mg at 02/04/17 1639    Lab Results:  Results for orders placed or performed during the hospital encounter of 01/19/17 (from the past 48 hour(s))  Glucose, capillary     Status: Abnormal   Collection Time: 02/02/17  9:09 PM  Result Value Ref Range   Glucose-Capillary 189 (H) 65 - 99 mg/dL   Comment 1 Notify RN   Glucose, capillary     Status: Abnormal   Collection Time: 02/03/17  7:15 AM  Result Value Ref Range   Glucose-Capillary 150 (H) 65 - 99 mg/dL   Comment 1 Notify RN   Glucose, capillary     Status: Abnormal   Collection Time: 02/03/17 11:31 AM  Result Value Ref Range   Glucose-Capillary 205 (H) 65 - 99 mg/dL  Glucose, capillary     Status: Abnormal   Collection Time: 02/03/17 12:45 PM  Result Value Ref Range   Glucose-Capillary 197 (H) 65 - 99 mg/dL  Glucose, capillary     Status: Abnormal   Collection Time: 02/03/17  4:33 PM  Result Value Ref Range   Glucose-Capillary 278 (H) 65 - 99 mg/dL   Comment 1 Notify RN   Glucose, capillary     Status: Abnormal    Collection Time: 02/03/17  9:06 PM  Result Value Ref Range   Glucose-Capillary 159 (H) 65 - 99 mg/dL  Glucose, capillary     Status: Abnormal   Collection Time: 02/04/17  7:17 AM  Result Value Ref Range   Glucose-Capillary 193 (H) 65 - 99 mg/dL   Comment 1 Notify RN   Glucose, capillary     Status: Abnormal   Collection Time: 02/04/17 11:30 AM  Result Value Ref Range   Glucose-Capillary 152 (H) 65 - 99 mg/dL   Comment 1 Notify RN     Blood Alcohol level:  Lab Results  Component Value Date   ETH <5 01/16/2017   ETH <5 12/21/2016    Metabolic Disorder Labs: Lab Results  Component Value Date   HGBA1C 7.6 (H) 01/19/2017   MPG 171.42 01/19/2017   MPG 249 11/09/2016   Lab Results  Component Value Date   PROLACTIN 24.5 (H) 09/24/2016   PROLACTIN 3.4 (L) 07/07/2016   Lab Results  Component Value Date   CHOL 122 01/19/2017   TRIG 154 (H) 01/19/2017   HDL 36 (L) 01/19/2017   CHOLHDL 3.4 01/19/2017   VLDL 31 01/19/2017   LDLCALC 55 01/19/2017   LDLCALC 59 12/28/2016    Physical Findings: AIMS: Facial and Oral Movements Muscles of Facial Expression: None, normal Lips and Perioral Area: None, normal Jaw: None, normal Tongue: None, normal,Extremity Movements Upper (arms, wrists, hands, fingers): Mild Lower (legs, knees, ankles, toes): None, normal, Trunk Movements Neck, shoulders, hips: None, normal, Overall Severity Severity of abnormal movements (highest score from questions above): Minimal Incapacitation due to abnormal movements: None, normal Patient's awareness of abnormal movements (rate only patient's  report): No Awareness, Dental Status Current problems with teeth and/or dentures?: No Does patient usually wear dentures?: No  CIWA:    COWS:  COWS Total Score: 1  Musculoskeletal: Strength & Muscle Tone: decreased Gait & Station: normal Patient leans: [name]  Psychiatric Specialty Exam: Physical Exam  Nursing note and vitals reviewed. Constitutional: He  appears well-developed and well-nourished.  HENT:  Head: Normocephalic and atraumatic.  Eyes: Pupils are equal, round, and reactive to light. Conjunctivae are normal.  Neck: Normal range of motion.  Cardiovascular: Regular rhythm and normal heart sounds.   Respiratory: Effort normal and breath sounds normal. No respiratory distress.  GI: Soft.  Musculoskeletal: Normal range of motion.  Neurological: He is alert.  Skin: Skin is warm and dry.  Psychiatric: His affect is blunt. His speech is delayed. He is slowed and withdrawn. Thought content is paranoid. Cognition and memory are impaired. He expresses impulsivity. He expresses suicidal ideation. He expresses no homicidal ideation.    Review of Systems  Constitutional: Negative.   HENT: Negative.   Eyes: Negative.   Respiratory: Negative.   Cardiovascular: Negative.   Gastrointestinal: Negative.   Musculoskeletal: Negative.   Skin: Negative.   Neurological: Negative.   Psychiatric/Behavioral: Positive for depression, hallucinations, memory loss and suicidal ideas. Negative for substance abuse. The patient is nervous/anxious and has insomnia.     Blood pressure 116/82, pulse 75, temperature 97.9 F (36.6 C), resp. rate 16, height 6' (1.829 m), weight 78.5 kg (173 lb), SpO2 99 %.Body mass index is 23.46 kg/m.  General Appearance: Disheveled  Eye Contact:  Fair  Speech:  Slow  Volume:  Decreased  Mood:  Depressed and Dysphoric  Affect:  Constricted and Depressed  Thought Process:  Disorganized  Orientation:  Full (Time, Place, and Person)  Thought Content:  Illogical, Hallucinations: Auditory and Paranoid Ideation  Suicidal Thoughts:  Yes.  without intent/plan  Homicidal Thoughts:  No  Memory:  Immediate;   Good Recent;   Fair Remote;   Fair  Judgement:  Impaired  Insight:  Shallow  Psychomotor Activity:  Decreased  Concentration:  Concentration: Poor  Recall:  Poor  Fund of Knowledge:  Poor  Language:  Fair  Akathisia:   No  Handed:  Right  AIMS (if indicated):     Assets:  Desire for Improvement  ADL's:  Impaired  Cognition:  Impaired,  Mild  Sleep:  Number of Hours: 7   Tony Cunningham has a history of schizoaffective disorder admitted for worsening of depression receiving ECT.  #Schizoaffective disorder, SI - Continue ECT - Next one on 02/01/17 - Continue clozapine 150 mg QHS, aim to optimize dose of antipsychotic monotherapy. Level pending and will hold at 150 mg.  - Prozac 40 mg.  HTN. He is on Lisinopril and Propranolol.   #Smoking cessation Continue nicotine patch 21 mg/24 hours  #DM Continue metformin 1000 mg BIDCC, Lantus 35 units at bedtime, Novolog 5 units before meals, ADA diet. Insulin sliding scale  #GERD Continue protonix EC 40 mg daily.   Disposition. TBE.   We are continuing ECT and medication changes including most recently an increase and change in his antidepressant. Treatment team aware of his multiple hospitalizations in the difficulty finding a safe place outside the hospital is considering referral to the state facility. Meanwhile work on improving current symptoms. Diabetes remains difficult to control. I did slightly increase his insulin yesterday and I don't want him to have a low blood sugar. Encouraged to stay on his regular diet  without extra food in between meals. Strongly encouraged to be out of bed and attending therapy groups and interacting with peers and staff.  01/30/2017. No medication adjustments today. 01/31/2017. No medication adjustments today.  No changes to medication. ECT today. Spent time with him before and after treatment discussing the overall plan. Treatment team rounded.  No ECT today. ECT scheduled tomorrow is canceled because of weather emergency. Supportive counseling and review of current plan with social work. Potential discharge by next week with her now next ECT scheduled for Monday.  I certify that the services received since the previous  certification/recertification were and continue to be medically necessary as the treatment provided can be reasonably expected to improve the patient's condition; the medical record documents that the services furnished were intensive treatment services or their equivalent services, and this patient continues to need, on a daily basis, active treatment furnished directly by or requiring the supervision of inpatient psychiatric personnel.  Mordecai Rasmussen, MD 02/04/2017, 4:48 PM

## 2017-02-04 NOTE — [facility] Group Notes (Signed)
[facility] Group Notes:  (Nursing/MHT/Case Management/Adjunct)  Date:  02/04/2017  Time:  9:32 PM  Type of Therapy:  Group Therapy  Participation Level:  Active  Participation Quality:  Appropriate  Affect:  Appropriate  Cognitive:  Appropriate  Insight:  Good  Engagement in Group:  Engaged  Modes of Intervention:  Support  Summary of Progress/Problems:  Tony Cunningham 02/04/2017, 9:32 PM

## 2017-02-05 LAB — CBC WITH DIFFERENTIAL/PLATELET
Basophils Absolute: 0.1 10*3/uL (ref 0–0.1)
Basophils Relative: 1 %
Eosinophils Absolute: 0.2 10*3/uL (ref 0–0.7)
Eosinophils Relative: 4 %
HCT: 40.9 % (ref 40.0–52.0)
HEMOGLOBIN: 14.4 g/dL (ref 13.0–18.0)
LYMPHS ABS: 2.5 10*3/uL (ref 1.0–3.6)
LYMPHS PCT: 42 %
MCH: 30.8 pg (ref 26.0–34.0)
MCHC: 35.1 g/dL (ref 32.0–36.0)
MCV: 87.9 fL (ref 80.0–100.0)
MONOS PCT: 6 %
Monocytes Absolute: 0.4 10*3/uL (ref 0.2–1.0)
NEUTROS PCT: 47 %
Neutro Abs: 2.8 10*3/uL (ref 1.4–6.5)
Platelets: 196 10*3/uL (ref 150–440)
RBC: 4.66 MIL/uL (ref 4.40–5.90)
RDW: 12.7 % (ref 11.5–14.5)
WBC: 6 10*3/uL (ref 3.8–10.6)

## 2017-02-05 LAB — GLUCOSE, CAPILLARY
GLUCOSE-CAPILLARY: 151 mg/dL — AB (ref 65–99)
Glucose-Capillary: 253 mg/dL — ABNORMAL HIGH (ref 65–99)

## 2017-02-05 NOTE — Progress Notes (Signed)
Pt says he is feeling better but still has some depression 4/10. Pt alert and oriented x 4. Pt denies SI, HI or A/V hallucinations. Pt contracted to safety.  Pt interacts appropriately with staff and peers. Pt is med compliant, no adverse affects noted. Pt blood glucose was 126, no coverage required. Pt did received 35 units of Lantus to left lower abdomen. 15 min safety checks continues.

## 2017-02-05 NOTE — Plan of Care (Signed)
Problem: Safety: Goal: Periods of time without injury will increase Outcome: Progressing Pt will remain injury free the entire shift.   

## 2017-02-05 NOTE — Plan of Care (Signed)
Problem: Activity: Goal: Sleeping patterns will improve Outcome: Progressing Patient slept for Estimated Hours of 7; Precautionary checks every 15 minutes for safety maintained, room free of safety hazards, patient sustains no injury or falls during this shift.    

## 2017-02-05 NOTE — Progress Notes (Signed)
CSW left message with pt sister, Aden Sek, requesting confirmation that plan for pt to return to the home of his mother upon discharge is OK with the mother. Garner Nash, MSW, LCSW Clinical Social Worker 02/05/2017 8:31 AM

## 2017-02-05 NOTE — Tx Team (Signed)
Interdisciplinary Treatment and Diagnostic Plan Update  02/05/2017 Time of Session: 1102 Teagan Heidrick MRN: [id number]  Principal Diagnosis: Schizoaffective disorder, depressive type (HCC)  Secondary Diagnoses: Principal Problem:   Schizoaffective disorder, depressive type (HCC) Active Problems:   Diabetes (HCC)   HTN (hypertension)   Tobacco use disorder   Dyslipidemia   Tardive dyskinesia   Current Medications:  Current Facility-Administered Medications  Medication Dose Route Frequency Provider Last Rate Last Dose  . acetaminophen (TYLENOL) tablet 650 mg  650 mg Oral Q6H PRN [name], [name], MD   650 mg at 01/22/17 1247  . alum & mag hydroxide-simeth (MAALOX/MYLANTA) 200-200-20 MG/5ML suspension 30 mL  30 mL Oral Q4H PRN [name], [name], MD      . cloZAPine (CLOZARIL) tablet 150 mg  150 mg Oral QHS [name], [name], MD   150 mg at 02/04/17 2125  . feeding supplement (GLUCERNA SHAKE) (GLUCERNA SHAKE) liquid 237 mL  237 mL Oral TID BM [name], [name], MD   237 mL at 02/05/17 1417  . FLUoxetine (PROZAC) capsule 40 mg  40 mg Oral Daily [name], [name], MD   40 mg at 02/05/17 0801  . haloperidol lactate (HALDOL) injection 5 mg  5 mg Intravenous Once [name], [name], MD      . insulin aspart (novoLOG) injection 0-5 Units  0-5 Units Subcutaneous QHS [name], [name], MD   5 Units at 02/04/17 2123  . insulin aspart (novoLOG) injection 0-9 Units  0-9 Units Subcutaneous TID WC [name], [name], MD   2 Units at 02/05/17 1204  . insulin aspart (novoLOG) injection 5 Units  5 Units Subcutaneous TID WC [name], [name], MD   5 Units at 02/05/17 1203  . insulin glargine (LANTUS) injection 35 Units  35 Units Subcutaneous QHS [name], [name], MD   35 Units at 02/04/17 2124  . lisinopril (PRINIVIL,ZESTRIL) tablet 20 mg  20 mg Oral Daily [name], [name], MD   20 mg at 02/05/17 0801  . magnesium hydroxide (MILK OF MAGNESIA) suspension 30 mL  30 mL Oral Daily PRN [name], [name], MD      . metFORMIN  (GLUCOPHAGE) tablet 1,000 mg  1,000 mg Oral BID WC [name], [name], MD   1,000 mg at 02/05/17 0801  . midazolam (VERSED) injection 2 mg  2 mg Intravenous Once [name], [name], MD      . montelukast (SINGULAIR) tablet 10 mg  10 mg Oral QHS [name], [name], MD   10 mg at 02/04/17 2125  . nicotine (NICODERM CQ - dosed in mg/24 hours) patch 21 mg  21 mg Transdermal Daily [name], [name], MD   21 mg at 01/30/17 0841  . pantoprazole (PROTONIX) EC tablet 40 mg  40 mg Oral Daily [name], [name], MD   40 mg at 02/05/17 0801  . propranolol (INDERAL) tablet 20 mg  20 mg Oral BID [name], [name], MD   20 mg at 02/05/17 0801   PTA Medications: Prescriptions Prior to Admission  Medication Sig Dispense Refill Last Dose  . amantadine (SYMMETREL) 100 MG capsule Take 1 capsule (100 mg total) by mouth 2 (two) times daily. For prevention of drug induced tremors 60 capsule 0 Unknown at Unknown  . benztropine (COGENTIN) 1 MG tablet Take 1 tablet (1 mg total) by mouth 2 (two) times daily. For prevention of drug induced tremors 60 tablet 0 Unknown at Unknown  . cholecalciferol 400 units tablet Take 1 tablet (400 Units total) by  mouth daily. For bone health 30 each 0 Unknown at Unknown  . citalopram (CELEXA) 20 MG tablet Take 1 tablet (20 mg total) by mouth daily. For depression 30 tablet 0 Unknown at Unknown  . cloZAPine (CLOZARIL) 50 MG tablet Take 3 tablets (150 mg total) by mouth at bedtime. For mood control 90 tablet 0 Unknown at Unknown  . cyclobenzaprine (FLEXERIL) 5 MG tablet Take 1 tablet (5 mg total) by mouth 3 (three) times daily as needed for muscle spasms. 15 tablet 0 Unknown at Unknown  . diclofenac sodium (VOLTAREN) 1 % GEL Apply 2 g topically 4 (four) times daily. For arthritis pain   Unknown at Unknown  . insulin aspart (NOVOLOG) 100 UNIT/ML injection Inject 6 Units into the skin 3 (three) times daily with meals. 10 mL 11 Unknown at Unknown  . insulin glargine (LANTUS) 100 UNIT/ML injection Inject  0.45 mLs (45 Units total) into the skin at bedtime. For diabetic management 10 mL 0   . insulin glargine (LANTUS) 100 UNIT/ML injection Inject 0.4 mLs (40 Units total) into the skin at bedtime. 10 mL 11   . lisinopril (PRINIVIL,ZESTRIL) 20 MG tablet Take 1 tablet (20 mg total) by mouth daily. For high blood pressure 30 tablet 0 Unknown at Unknown  . metFORMIN (GLUCOPHAGE) 1000 MG tablet Take 1 tablet (1,000 mg total) by mouth 2 (two) times daily with a meal. For diabetes management 60 tablet 0 Unknown at Unknown  . montelukast (SINGULAIR) 10 MG tablet Take 1 tablet (10 mg total) by mouth at bedtime. For shortness of breath 30 tablet 0 Unknown at Unknown  . nicotine polacrilex (NICORETTE) 2 MG gum Take 1 each (2 mg total) by mouth as needed for smoking cessation. 100 tablet 0 Unknown at Unknown  . pantoprazole (PROTONIX) 40 MG tablet Take 1 tablet (40 mg total) by mouth daily. For acid reflux 30 tablet 0 Unknown at Unknown  . polyethylene glycol (MIRALAX / GLYCOLAX) packet Take 17 g by mouth daily. For constipation 14 each 0 Unknown at Unknown  . propranolol (INDERAL) 20 MG tablet Take 1 tablet (20 mg total) by mouth 2 (two) times daily. For high blood pressure 60 tablet 0 01/27/2017 at 0619  . simvastatin (ZOCOR) 20 MG tablet Take 1 tablet (20 mg total) by mouth daily at 6 PM. For high Cholesterol 30 tablet 0 Unknown at Unknown  . temazepam (RESTORIL) 7.5 MG capsule Take 1 capsule (7.5 mg total) by mouth at bedtime. For sleep 14 capsule 0 Unknown at Unknown    Patient Stressors: Health problems Medication change or noncompliance  Patient Strengths: Active sense of humor Motivation for treatment/growth  Treatment Modalities: Medication Management, Group therapy, Case management,  1 to 1 session with clinician, Psychoeducation, Recreational therapy.   Physician Treatment Plan for Primary Diagnosis: Schizoaffective disorder, depressive type (HCC) Long Term Goal(s): Improvement in symptoms so as  ready for discharge NA   Short Term Goals: Ability to identify changes in lifestyle to reduce recurrence of condition will improve Ability to verbalize feelings will improve Ability to disclose and discuss suicidal ideas Ability to demonstrate self-control will improve Ability to identify and develop effective coping behaviors will improve Ability to maintain clinical measurements within normal limits will improve Ability to identify triggers associated with substance abuse/mental health issues will improve NA  Medication Management: Evaluate patient's response, side effects, and tolerance of medication regimen.  Therapeutic Interventions: 1 to 1 sessions, Unit Group sessions and Medication administration.  Evaluation of Outcomes: Progressing  Physician  Treatment Plan for Secondary Diagnosis: Principal Problem:   Schizoaffective disorder, depressive type (HCC) Active Problems:   Diabetes (HCC)   HTN (hypertension)   Tobacco use disorder   Dyslipidemia   Tardive dyskinesia  Long Term Goal(s): Improvement in symptoms so as ready for discharge NA   Short Term Goals: Ability to identify changes in lifestyle to reduce recurrence of condition will improve Ability to verbalize feelings will improve Ability to disclose and discuss suicidal ideas Ability to demonstrate self-control will improve Ability to identify and develop effective coping behaviors will improve Ability to maintain clinical measurements within normal limits will improve Ability to identify triggers associated with substance abuse/mental health issues will improve NA     Medication Management: Evaluate patient's response, side effects, and tolerance of medication regimen.  Therapeutic Interventions: 1 to 1 sessions, Unit Group sessions and Medication administration.  Evaluation of Outcomes: Progressing   RN Treatment Plan for Primary Diagnosis: Schizoaffective disorder, depressive type (HCC) Long Term Goal(s):  Knowledge of disease and therapeutic regimen to maintain health will improve  Short Term Goals: Ability to identify and develop effective coping behaviors will improve and Compliance with prescribed medications will improve  Medication Management: RN will administer medications as ordered by provider, will assess and evaluate patient's response and provide education to patient for prescribed medication. RN will report any adverse and/or side effects to prescribing provider.  Therapeutic Interventions: 1 on 1 counseling sessions, Psychoeducation, Medication administration, Evaluate responses to treatment, Monitor vital signs and CBGs as ordered, Perform/monitor CIWA, COWS, AIMS and Fall Risk screenings as ordered, Perform wound care treatments as ordered.  Evaluation of Outcomes: Progressing   LCSW Treatment Plan for Primary Diagnosis: Schizoaffective disorder, depressive type (HCC) Long Term Goal(s): Safe transition to appropriate next level of care at discharge, Engage patient in therapeutic group addressing interpersonal concerns.  Short Term Goals: Engage patient in aftercare planning with referrals and resources and Increase skills for wellness and recovery  Therapeutic Interventions: Assess for all discharge needs, 1 to 1 time with Social worker, Explore available resources and support systems, Assess for adequacy in community support network, Educate family and significant other(s) on suicide prevention, Complete Psychosocial Assessment, Interpersonal group therapy.  Evaluation of Outcomes: Progressing   Progress in Treatment: Attending groups: No. Participating in groups: No. Taking medication as prescribed: Yes. Toleration medication: Yes. Family/Significant other contact made: No, will contact:  attempts made Patient understands diagnosis: Yes. Discussing patient identified problems/goals with staff: Yes. Medical problems stabilized or resolved: Yes. Denies suicidal/homicidal  ideation: Yes. Issues/concerns per patient self-inventory: No. Other: none  New problem(s) identified: No, Describe:  none  New Short Term/Long Term Goal(s): Pt unable to formulate a goal.  Discharge Plan or Barriers: Treatment team meeting on 01/29/17 identified [facility] day program and peer support as after care plan.  Reason for Continuation of Hospitalization: Other; describe ECT  Estimated Length of Stay: 5 days  Attendees: Patient: 02/05/2017   Physician: Dr. Jennet Maduro, MD 02/05/2017   Nursing: Leonia Reader, RN 02/05/2017   RN Care Manager: 02/05/2017   Social Worker: Daleen Squibb, LCSW 02/05/2017   Recreational Therapist:  02/05/2017   Other:  02/05/2017   Other:  02/05/2017   Other: 02/05/2017              Scribe for Treatment Team: [name], LCSW 02/05/2017 2:21 PM

## 2017-02-05 NOTE — Plan of Care (Signed)
Problem: Safety: Goal: Ability to disclose and discuss suicidal ideas will improve Outcome: Progressing Pt remains safe while in hospital injury free.    

## 2017-02-05 NOTE — Progress Notes (Signed)
BDaily contact with patient to assess and evaluate symptoms and progress in treatment, Medication management and Plan [facility] MD Progress Note  02/05/2017 2:27 PM Tony Cunningham  MRN:  [id number] Subjective:  41 year old man with a history of schizoaffective disorder who came back into the hospital only shortly after his most recent discharge this time reporting hallucinations and suicidal ideation. On evaluation today patient is in bed staying withdrawn and hardly ever attending groups. He tells me his mood is very sad and feels frightened. He says he's having auditory hallucinations and active wishes that he were dead. He is not acting on any of these and is not aggressive. Patient has been compliant with medication. He admits to me that he really would rather stay in the hospital forever and does not really ever want to be placed in a group home or any other kind of even semi-independent living situation.  Follow-up for Thursday the 30th. Patient seen chart reviewed. This 41 year old man with chronic severe mental health problems and failure to transition to outpatient care is once again complaining of hallucinations. He is withdrawn most of the time. Minimal interaction with other people. Denies active suicidal thoughts. Hardly ever gets around other people though. Tolerating medicines well. His blood sugars are reasonably stable although he once again had an over 300 reading this evening.  Follow-up for Friday the 31st. Patient had ECT bilateral treatment this morning which was well tolerated. Seen this afternoon in treatment team he had no new complaint. Continues to describe himself as very depressed. Continues to report suicidal thoughts and hallucinations. Rarely gets out of his bed and attends almost no groups or therapeutic activities. Blood sugars remained somewhat labile with an elevated sugar this morning.  01/23/17 - patient reports that he continues to have monitoring hallucinations which are  troublesome and distressing. The voices make him feel suicidal. Spent most of the day in bed, saw him walking around in the evening. Denies active plan but does endorse that he has transient thoughts of suicide.  01/24/17 - patient reports that he feels "so-so". Continues to have monitoring hallucinations although not as severe as yesterday. Endorses that he had a good night's rest. I tried to clarify the dosing and duration of clozapine from him but he states that he does not know.  Follow-up for September 3. Patient still lying in bed all day. He tells me that he still hears voices but they are "less". He can't really quantify that. Denies active suicidal thoughts. Admits that he worries constantly about how he will function outside of the hospital. Chart reviewed. I note that the last clozapine level that came back was in the middle of last month and was well into the therapeutic range.  All up for September 5. Patient seen. Chart reviewed. He had ECT today which went well as usual. He continues to complain of feeling confused and of feeling very anxious. Says he has racing thoughts and suicidal thoughts. Stays very withdrawn in his bed.  Follow-up for Thursday the sixth. Patient remains bedbound almost constantly. He speaks very little. Continues to report having "racing thoughts" and feeling frightened. Affect flat and blunted. Unable to articulate much more detail than that. Poor self-care although it looks like his sugars are starting to creep up.  Follow-up note for Friday the seventh. Patient had ECT treatment again today which as usual was tolerated without difficulty. Patient continues to report that he feels sad and frightened and anxious. He says he still has some suicidal  thoughts but has no current plan to act on it. He says the hallucinations are getting a little better. Although he only has partial insight into it staff have noticed that he is a little bit more interactive and his affect a  little more appropriate. His hygiene has improved. Blood sugars remain labile.  01/30/2017. Tony Cunningham is feeling "so so". He is in bed this morning but for the past two days he was visible on the unit in the afternoon with brighter affect and a little smile. When better, he approaches me for small conversation. He went outside yesterday. He accepts medications and tolerates them well. He seems more hopeful about the future.  01/31/2017. Tony Cunningham has been more visible in the milieu this weekend. He came to group and participated in activities outside yesterday. Today in the morning however, he doubts that any progress has been made. No somatic complaints. Tolerates teatment well.  Follow-up for the 10th. Patient continues to be a little bit more visible. Affect is still generally flat and depressed. Treatment team discussed the patient today. Patient has been confronted and advised that he really needs to commit himself to making an effort to stay outside the hospital at some point. Patient is seeming to make a little more an effort. Had ECT again today. Tolerating medicines well. Blood sugars fairly stable  Follow-up for Tuesday the 11th. Patient seen chart reviewed. He was up out of bed walking around the unit today. He tells me that he is a little bit better. I talked with nurses about how he has variable presentation of his symptoms sometimes from minute to minute. He denied to me any suicidal thoughts but still says he feels confused a lot of the time. Tolerating ECT and medicine well.  Follow-up for Wednesday the 12th. Once again had ECT today which was tolerated well without any difficulty. Patient continues to be a little hard to draw out. His report of symptoms is somewhat inconsistent. Sometimes he will talk about having hallucinations at other times denying that same thing with suicidal thoughts. Overall he has not shown any acute dangerous behavior. He seems to be passively cooperative and tolerating  medicine. Spoke with treatment team today and we have confirm that the mother would be agreeable to taking him home after discharge.  Follow-up for Thursday the 13th. Patient was out of his room and was actually participating in recreational activities. He had been shooting baskets earlier and was sitting with a group of peers staying awake and somewhat interactive outdoors. Patient says he feels "weird" but can't really define what that means. He is not reporting acute suicidality. Still appears to be anxious and a little withdrawn. We reviewed his progress with the ECT. The patient seems to be accepting the idea of possible discharge next week.  Follow-up for Friday 14th. Patient tells me today he is feeling anxious. He is able to talk about being nervous about leaving the hospital. He doesn't go into much detail but at least engaged in a little more appropriate back and forth. Doesn't report any acute suicidal intent or plan. He was sitting on his bed brooding but at least that seems a little bit better than just laying in bed. He tells me he has been to groups today. Per nursing: Patient was visible in the day area and social with select peers. Per report he has been outside for group activity. Patient states he attends daily group meetings.   Principal Problem: Schizoaffective disorder, depressive type (  HCC) Diagnosis:   Patient Active Problem List   Diagnosis Date Noted  . 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 Schizoaffective disorder, depressive type (HCC) [F25.1] 09/23/2016  . Polysubstance abuse [F19.10] 07/17/2016  . Tardive dyskinesia [G24.01] 07/16/2016  . Tobacco use disorder [F17.200] 07/07/2016  . Dyslipidemia [E78.5] 07/07/2016  . Asthma [J45.909] 07/07/2016  . HTN (hypertension) [I10] 07/06/2016  . Diabetes (HCC) [E11.9] 12/25/2010   Total Time spent with patient: 20 minutes  Past Psychiatric History: Patient has a long-standing history of mental  illness but over the last year or so seems to of decompensated more than usual and has had one hospitalization after another. He did show some response to ECT and antipsychotic medication but seems to decompensate immediately whenever he is discharged.  Past Medical History:  Past Medical History:  Diagnosis Date  . Anxiety   . Asthma   . Diabetes mellitus   . High blood pressure   . Sinus complaint    History reviewed. No pertinent surgical history. Family History: History reviewed. No pertinent family history. Family Psychiatric  History: Unknown Social History:  History  Alcohol Use No     History  Drug Use No    Social History   Social History  . Marital status: Single    Spouse name: [name]  . Number of children: [name]  . Years of education: [name]   Social History Main Topics  . Smoking status: Current Every Day Smoker    Packs/day: 0.50    Types: Cigarettes  . Smokeless tobacco: Never Used  . Alcohol use No  . Drug use: No  . Sexual activity: Not Currently   Other Topics Concern  . None   Social History Narrative  . None   Additional Social History:                         Sleep: Fair  Appetite:  Poor  Current Medications: Current Facility-Administered Medications  Medication Dose Route Frequency Provider Last Rate Last Dose  . acetaminophen (TYLENOL) tablet 650 mg  650 mg Oral Q6H PRN [name], [name], MD   650 mg at 01/22/17 1247  . alum & mag hydroxide-simeth (MAALOX/MYLANTA) 200-200-20 MG/5ML suspension 30 mL  30 mL Oral Q4H PRN [name], [name], MD      . cloZAPine (CLOZARIL) tablet 150 mg  150 mg Oral QHS [name], [name], MD   150 mg at 02/04/17 2125  . feeding supplement (GLUCERNA SHAKE) (GLUCERNA SHAKE) liquid 237 mL  237 mL Oral TID BM [name], [name], MD   237 mL at 02/05/17 1417  . FLUoxetine (PROZAC) capsule 40 mg  40 mg Oral Daily [name], [name], MD   40 mg at 02/05/17 0801  . haloperidol lactate (HALDOL) injection 5 mg  5 mg Intravenous  Once [name], [name], MD      . insulin aspart (novoLOG) injection 0-5 Units  0-5 Units Subcutaneous QHS [name], [name], MD   5 Units at 02/04/17 2123  . insulin aspart (novoLOG) injection 0-9 Units  0-9 Units Subcutaneous TID WC [name], [name], MD   2 Units at 02/05/17 1204  . insulin aspart (novoLOG) injection 5 Units  5 Units Subcutaneous TID WC [name], [name], MD   5 Units at 02/05/17 1203  . insulin glargine (LANTUS) injection 35 Units  35 Units Subcutaneous QHS [name], [name], MD   35 Units at 02/04/17 2124  . lisinopril (PRINIVIL,ZESTRIL) tablet 20  mg  20 mg Oral Daily [name], [name], MD   20 mg at 02/05/17 0801  . magnesium hydroxide (MILK OF MAGNESIA) suspension 30 mL  30 mL Oral Daily PRN [name], [name], MD      . metFORMIN (GLUCOPHAGE) tablet 1,000 mg  1,000 mg Oral BID WC [name], [name], MD   1,000 mg at 02/05/17 0801  . midazolam (VERSED) injection 2 mg  2 mg Intravenous Once [name], [name], MD      . montelukast (SINGULAIR) tablet 10 mg  10 mg Oral QHS [name], [name], MD   10 mg at 02/04/17 2125  . nicotine (NICODERM CQ - dosed in mg/24 hours) patch 21 mg  21 mg Transdermal Daily [name], [name], MD   21 mg at 01/30/17 0841  . pantoprazole (PROTONIX) EC tablet 40 mg  40 mg Oral Daily [name], [name], MD   40 mg at 02/05/17 0801  . propranolol (INDERAL) tablet 20 mg  20 mg Oral BID [name], [name], MD   20 mg at 02/05/17 0801    Lab Results:  Results for orders placed or performed during the hospital encounter of 01/19/17 (from the past 48 hour(s))  Glucose, capillary     Status: Abnormal   Collection Time: 02/03/17  4:33 PM  Result Value Ref Range   Glucose-Capillary 278 (H) 65 - 99 mg/dL   Comment 1 Notify RN   Glucose, capillary     Status: Abnormal   Collection Time: 02/03/17  9:06 PM  Result Value Ref Range   Glucose-Capillary 159 (H) 65 - 99 mg/dL  Glucose, capillary     Status: Abnormal   Collection Time: 02/04/17  7:17 AM  Result Value Ref Range    Glucose-Capillary 193 (H) 65 - 99 mg/dL   Comment 1 Notify RN   Glucose, capillary     Status: Abnormal   Collection Time: 02/04/17 11:30 AM  Result Value Ref Range   Glucose-Capillary 152 (H) 65 - 99 mg/dL   Comment 1 Notify RN   Glucose, capillary     Status: Abnormal   Collection Time: 02/04/17  4:38 PM  Result Value Ref Range   Glucose-Capillary 305 (H) 65 - 99 mg/dL   Comment 1 Notify RN   Glucose, capillary     Status: Abnormal   Collection Time: 02/04/17  8:51 PM  Result Value Ref Range   Glucose-Capillary 388 (H) 65 - 99 mg/dL  CBC with Differential/Platelet     Status: None   Collection Time: 02/05/17  7:10 AM  Result Value Ref Range   WBC 6.0 3.8 - 10.6 K/uL   RBC 4.66 4.40 - 5.90 MIL/uL   Hemoglobin 14.4 13.0 - 18.0 g/dL   HCT 27.2 53.6 - 64.4 %   MCV 87.9 80.0 - 100.0 fL   MCH 30.8 26.0 - 34.0 pg   MCHC 35.1 32.0 - 36.0 g/dL   RDW 03.4 74.2 - 59.5 %   Platelets 196 150 - 440 K/uL   Neutrophils Relative % 47 %   Neutro Abs 2.8 1.4 - 6.5 K/uL   Lymphocytes Relative 42 %   Lymphs Abs 2.5 1.0 - 3.6 K/uL   Monocytes Relative 6 %   Monocytes Absolute 0.4 0.2 - 1.0 K/uL   Eosinophils Relative 4 %   Eosinophils Absolute 0.2 0 - 0.7 K/uL   Basophils Relative 1 %   Basophils Absolute 0.1 0 - 0.1 K/uL  Glucose, capillary     Status:  Abnormal   Collection Time: 02/05/17  7:12 AM  Result Value Ref Range   Glucose-Capillary 253 (H) 65 - 99 mg/dL   Comment 1 Notify RN   Glucose, capillary     Status: Abnormal   Collection Time: 02/05/17 11:21 AM  Result Value Ref Range   Glucose-Capillary 151 (H) 65 - 99 mg/dL    Blood Alcohol level:  Lab Results  Component Value Date   ETH <5 01/16/2017   ETH <5 12/21/2016    Metabolic Disorder Labs: Lab Results  Component Value Date   HGBA1C 7.6 (H) 01/19/2017   MPG 171.42 01/19/2017   MPG 249 11/09/2016   Lab Results  Component Value Date   PROLACTIN 24.5 (H) 09/24/2016   PROLACTIN 3.4 (L) 07/07/2016   Lab Results   Component Value Date   CHOL 122 01/19/2017   TRIG 154 (H) 01/19/2017   HDL 36 (L) 01/19/2017   CHOLHDL 3.4 01/19/2017   VLDL 31 01/19/2017   LDLCALC 55 01/19/2017   LDLCALC 59 12/28/2016    Physical Findings: AIMS: Facial and Oral Movements Muscles of Facial Expression: None, normal Lips and Perioral Area: None, normal Jaw: None, normal Tongue: None, normal,Extremity Movements Upper (arms, wrists, hands, fingers): Mild Lower (legs, knees, ankles, toes): None, normal, Trunk Movements Neck, shoulders, hips: None, normal, Overall Severity Severity of abnormal movements (highest score from questions above): Minimal Incapacitation due to abnormal movements: None, normal Patient's awareness of abnormal movements (rate only patient's report): No Awareness, Dental Status Current problems with teeth and/or dentures?: No Does patient usually wear dentures?: No  CIWA:    COWS:  COWS Total Score: 1  Musculoskeletal: Strength & Muscle Tone: decreased Gait & Station: normal Patient leans: [name]  Psychiatric Specialty Exam: Physical Exam  Nursing note and vitals reviewed. Constitutional: He appears well-developed and well-nourished.  HENT:  Head: Normocephalic and atraumatic.  Eyes: Pupils are equal, round, and reactive to light. Conjunctivae are normal.  Neck: Normal range of motion.  Cardiovascular: Regular rhythm and normal heart sounds.   Respiratory: Effort normal and breath sounds normal. No respiratory distress.  GI: Soft.  Musculoskeletal: Normal range of motion.  Neurological: He is alert.  Skin: Skin is warm and dry.  Psychiatric: His mood appears anxious. His affect is blunt. His speech is delayed. He is slowed and withdrawn. Thought content is paranoid. Cognition and memory are impaired. He expresses impulsivity. He expresses no homicidal and no suicidal ideation.    Review of Systems  Constitutional: Negative.   HENT: Negative.   Eyes: Negative.   Respiratory:  Negative.   Cardiovascular: Negative.   Gastrointestinal: Negative.   Musculoskeletal: Negative.   Skin: Negative.   Neurological: Negative.   Psychiatric/Behavioral: Positive for depression, hallucinations, memory loss and suicidal ideas. Negative for substance abuse. The patient is nervous/anxious and has insomnia.     Blood pressure 113/81, pulse 77, temperature 98.2 F (36.8 C), temperature source Oral, resp. rate 18, height 6' (1.829 m), weight 78.5 kg (173 lb), SpO2 99 %.Body mass index is 23.46 kg/m.  General Appearance: Disheveled  Eye Contact:  Fair  Speech:  Slow  Volume:  Decreased  Mood:  Depressed and Dysphoric  Affect:  Constricted and Depressed  Thought Process:  Disorganized  Orientation:  Full (Time, Place, and Person)  Thought Content:  Illogical, Hallucinations: Auditory and Paranoid Ideation  Suicidal Thoughts:  Yes.  without intent/plan  Homicidal Thoughts:  No  Memory:  Immediate;   Good Recent;   Fair Remote;  Fair  Judgement:  Impaired  Insight:  Shallow  Psychomotor Activity:  Decreased  Concentration:  Concentration: Poor  Recall:  Poor  Fund of Knowledge:  Poor  Language:  Fair  Akathisia:  No  Handed:  Right  AIMS (if indicated):     Assets:  Desire for Improvement  ADL's:  Impaired  Cognition:  Impaired,  Mild  Sleep:  Number of Hours: 7   Tony Cunningham has a history of schizoaffective disorder admitted for worsening of depression receiving ECT.  #Schizoaffective disorder, SI - Continue ECT - Next one on 02/01/17 - Continue clozapine 150 mg QHS, aim to optimize dose of antipsychotic monotherapy. Level pending and will hold at 150 mg.  - Prozac 40 mg.  HTN. He is on Lisinopril and Propranolol.   #Smoking cessation Continue nicotine patch 21 mg/24 hours  #DM Continue metformin 1000 mg BIDCC, Lantus 35 units at bedtime, Novolog 5 units before meals, ADA diet. Insulin sliding scale  #GERD Continue protonix EC 40 mg daily.   Disposition.  TBE.   We are continuing ECT and medication changes including most recently an increase and change in his antidepressant. Treatment team aware of his multiple hospitalizations in the difficulty finding a safe place outside the hospital is considering referral to the state facility. Meanwhile work on improving current symptoms. Diabetes remains difficult to control. I did slightly increase his insulin yesterday and I don't want him to have a low blood sugar. Encouraged to stay on his regular diet without extra food in between meals. Strongly encouraged to be out of bed and attending therapy groups and interacting with peers and staff.  01/30/2017. No medication adjustments today. 01/31/2017. No medication adjustments today.  No changes to medication. ECT today. Spent time with him before and after treatment discussing the overall plan. Treatment team rounded.  Gradually improving. Next ECT scheduled for Monday. No change to medicine for today. I will continue to try and engage him in supportive conversation and praise his attempts to get out of his room and work on coping skills.  I certify that the services received since the previous certification/recertification were and continue to be medically necessary as the treatment provided can be reasonably expected to improve the patient's condition; the medical record documents that the services furnished were intensive treatment services or their equivalent services, and this patient continues to need, on a daily basis, active treatment furnished directly by or requiring the supervision of inpatient psychiatric personnel.  Mordecai Rasmussen, MD 02/05/2017, 2:27 PM

## 2017-02-05 NOTE — [facility] Group Notes (Signed)
[facility] Group Notes:  (Nursing/MHT/Case Management/Adjunct)  Date:  02/05/2017  Time:  10:02 AM  Type of Therapy:  Psychoeducational Skills  Participation Level:  Did Not Attend  Sophronia Varney C Lynnita Somma 02/05/2017, 10:02 AM 

## 2017-02-05 NOTE — [facility] Group Notes (Signed)
[facility] LCSW Group Therapy  02/05/2017 1:59 PM  Type of Therapy:  Group Therapy  Participation Level:  Did Not Attend  Patient was invited to group and chose to not attend.   Tony Cunningham 02/05/2017, 1:59 PM

## 2017-02-05 NOTE — Progress Notes (Signed)
Inpatient Diabetes Program Recommendations  AACE/ADA: New Consensus Statement on Inpatient Glycemic Control (2015)  Target Ranges:  Prepandial:   less than 140 mg/dL      Peak postprandial:   less than 180 mg/dL (1-2 hours)      Critically ill patients:  140 - 180 mg/dL   Results for RANDEE, UPCHURCH (MRN [id number]) as of 02/05/2017 08:55  Ref. Range 02/04/2017 07:17 02/04/2017 11:30 02/04/2017 16:38 02/04/2017 20:51 02/05/2017 07:12  Glucose-Capillary Latest Ref Range: 65 - 99 mg/dL 678 (H) 938 (H) 101 (H) 388 (H) 253 (H)   Review of Glycemic Control  Current orders for Inpatient glycemic control: Lantus 35 units QHS, Novolog 5 units TID with meals, Novolog 0-9 units TID with meals, Novolog 0-5 units QHS, Metformin 1000 mg BID  Inpatient Diabetes Program Recommendations:  Insulin - Basal: Please consider increasing Lantus to 38 units QHS. Insulin - Meal Coverage: Please consider increasing meal coverage to Novolog 8 units TID with meals.  Thanks, Orlando Penner, RN, MSN, CDE Diabetes Coordinator Inpatient Diabetes Program [phone] (Team Pager from 8am to 5pm)

## 2017-02-05 NOTE — Progress Notes (Signed)
Pt requires very much encouragement to get out of bed and perform ADL's. He is seen in bed majority of shift. RN asked pt to get up and take shower this morning. Pt is medication and meal compliant. Denies SI, HI, a/v hallucinations at this time. He commits to safety on unit. Will continue to monitor for safety.

## 2017-02-05 NOTE — Progress Notes (Signed)
BDaily contact with patient to assess and evaluate symptoms and progress in treatment, Medication management and Plan [facility] MD Progress Note  02/05/2017 8:40 AM Tony Cunningham  MRN:  [id number] Subjective:  41 year old man with a history of schizoaffective disorder who came back into the hospital only shortly after his most recent discharge this time reporting hallucinations and suicidal ideation. On evaluation today patient is in bed staying withdrawn and hardly ever attending groups. He tells me his mood is very sad and feels frightened. He says he's having auditory hallucinations and active wishes that he were dead. He is not acting on any of these and is not aggressive. Patient has been compliant with medication. He admits to me that he really would rather stay in the hospital forever and does not really ever want to be placed in a group home or any other kind of even semi-independent living situation.  Follow-up for Wednesday the 12th. Once again had ECT today which was tolerated well without any difficulty. Patient continues to be a little hard to draw out. His report of symptoms is somewhat inconsistent. Sometimes he will talk about having hallucinations at other times denying that same thing with suicidal thoughts. Overall he has not shown any acute dangerous behavior. He seems to be passively cooperative and tolerating medicine. Spoke with treatment team today and we have confirm that the mother would be agreeable to taking him home after discharge.  Follow-up for Thursday the 13th. Patient was out of his room and was actually participating in recreational activities. He had been shooting baskets earlier and was sitting with a group of peers staying awake and somewhat interactive outdoors. Patient says he feels "weird" but can't really define what that means. He is not reporting acute suicidality. Still appears to be anxious and a little withdrawn. We reviewed his progress with the ECT. The patient seems  to be accepting the idea of possible discharge next week.  02/05/2017. Mr. Munger has no complaints and feels "so so" today. Yesterday he was up in the community in the afternoon. He was able to provide support to one of his peers who will start ECT treatment next week. There are no somatic complains. Mood is improving, affect brighter. This morning, as always, he is in bed. SW informed me about the paln to discharge Tony Cunningham to his mother next week. He will continue ECT on Monday.  Per nursing: Pessimistic, rigid, needs a lot of encouragement for his ADLs; inconsistent with thoughts and statements; visible in the milieu, continues to verbalized AH now and then with passive suicidal ideation, "I feel good about ECT, the next one is on Monday.." CBG=388, SSI coverage and Lantus 35 Units given; complied with medication at bedtime.  Principal Problem: Schizoaffective disorder, depressive type (HCC) Diagnosis:   Patient Active Problem List   Diagnosis Date Noted  . 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 Schizoaffective disorder, depressive type (HCC) [F25.1] 09/23/2016  . Polysubstance abuse [F19.10] 07/17/2016  . Tardive dyskinesia [G24.01] 07/16/2016  . Tobacco use disorder [F17.200] 07/07/2016  . Dyslipidemia [E78.5] 07/07/2016  . Asthma [J45.909] 07/07/2016  . HTN (hypertension) [I10] 07/06/2016  . Diabetes (HCC) [E11.9] 12/25/2010   Total Time spent with patient: 30 minutes  Past Psychiatric History: Patient has a long-standing history of mental illness but over the last year or so seems to of decompensated more than usual and has had one hospitalization after another. He did show some response to ECT and antipsychotic medication but seems to decompensate immediately  whenever he is discharged.  Past Medical History:  Past Medical History:  Diagnosis Date  . Anxiety   . Asthma   . Diabetes mellitus   . High blood pressure   . Sinus complaint    History reviewed. No  pertinent surgical history. Family History: History reviewed. No pertinent family history. Family Psychiatric  History: Unknown Social History:  History  Alcohol Use No     History  Drug Use No    Social History   Social History  . Marital status: Single    Spouse name: [name]  . Number of children: [name]  . Years of education: [name]   Social History Main Topics  . Smoking status: Current Every Day Smoker    Packs/day: 0.50    Types: Cigarettes  . Smokeless tobacco: Never Used  . Alcohol use No  . Drug use: No  . Sexual activity: Not Currently   Other Topics Concern  . None   Social History Narrative  . None   Additional Social History:                         Sleep: Fair  Appetite:  Poor  Current Medications: Current Facility-Administered Medications  Medication Dose Route Frequency Provider Last Rate Last Dose  . acetaminophen (TYLENOL) tablet 650 mg  650 mg Oral Q6H PRN [name], [name], MD   650 mg at 01/22/17 1247  . alum & mag hydroxide-simeth (MAALOX/MYLANTA) 200-200-20 MG/5ML suspension 30 mL  30 mL Oral Q4H PRN [name], [name], MD      . cloZAPine (CLOZARIL) tablet 150 mg  150 mg Oral QHS [name], [name], MD   150 mg at 02/04/17 2125  . feeding supplement (GLUCERNA SHAKE) (GLUCERNA SHAKE) liquid 237 mL  237 mL Oral TID BM [name], [name], MD   237 mL at 02/04/17 2000  . FLUoxetine (PROZAC) capsule 40 mg  40 mg Oral Daily [name], [name], MD   40 mg at 02/05/17 0801  . haloperidol lactate (HALDOL) injection 5 mg  5 mg Intravenous Once [name], [name], MD      . insulin aspart (novoLOG) injection 0-5 Units  0-5 Units Subcutaneous QHS [name], [name], MD   5 Units at 02/04/17 2123  . insulin aspart (novoLOG) injection 0-9 Units  0-9 Units Subcutaneous TID WC [name], [name], MD   5 Units at 02/05/17 0802  . insulin aspart (novoLOG) injection 5 Units  5 Units Subcutaneous TID WC [name], [name], MD   5 Units at 02/05/17 0801  . insulin glargine (LANTUS)  injection 35 Units  35 Units Subcutaneous QHS [name], [name], MD   35 Units at 02/04/17 2124  . lisinopril (PRINIVIL,ZESTRIL) tablet 20 mg  20 mg Oral Daily [name], [name], MD   20 mg at 02/05/17 0801  . magnesium hydroxide (MILK OF MAGNESIA) suspension 30 mL  30 mL Oral Daily PRN [name], [name], MD      . metFORMIN (GLUCOPHAGE) tablet 1,000 mg  1,000 mg Oral BID WC [name], [name], MD   1,000 mg at 02/05/17 0801  . midazolam (VERSED) injection 2 mg  2 mg Intravenous Once [name], [name], MD      . montelukast (SINGULAIR) tablet 10 mg  10 mg Oral QHS [name], [name], MD   10 mg at 02/04/17 2125  . nicotine (NICODERM CQ - dosed in mg/24 hours) patch 21 mg  21 mg Transdermal Daily Ottis Vacha,  Tyreona Panjwani B, MD   21 mg at 01/30/17 0841  . pantoprazole (PROTONIX) EC tablet 40 mg  40 mg Oral Daily [name], [name], MD   40 mg at 02/05/17 0801  . propranolol (INDERAL) tablet 20 mg  20 mg Oral BID [name], [name], MD   20 mg at 02/05/17 0801    Lab Results:  Results for orders placed or performed during the hospital encounter of 01/19/17 (from the past 48 hour(s))  Glucose, capillary     Status: Abnormal   Collection Time: 02/03/17 11:31 AM  Result Value Ref Range   Glucose-Capillary 205 (H) 65 - 99 mg/dL  Glucose, capillary     Status: Abnormal   Collection Time: 02/03/17 12:45 PM  Result Value Ref Range   Glucose-Capillary 197 (H) 65 - 99 mg/dL  Glucose, capillary     Status: Abnormal   Collection Time: 02/03/17  4:33 PM  Result Value Ref Range   Glucose-Capillary 278 (H) 65 - 99 mg/dL   Comment 1 Notify RN   Glucose, capillary     Status: Abnormal   Collection Time: 02/03/17  9:06 PM  Result Value Ref Range   Glucose-Capillary 159 (H) 65 - 99 mg/dL  Glucose, capillary     Status: Abnormal   Collection Time: 02/04/17  7:17 AM  Result Value Ref Range   Glucose-Capillary 193 (H) 65 - 99 mg/dL   Comment 1 Notify RN   Glucose, capillary     Status: Abnormal   Collection Time: 02/04/17 11:30  AM  Result Value Ref Range   Glucose-Capillary 152 (H) 65 - 99 mg/dL   Comment 1 Notify RN   Glucose, capillary     Status: Abnormal   Collection Time: 02/04/17  4:38 PM  Result Value Ref Range   Glucose-Capillary 305 (H) 65 - 99 mg/dL   Comment 1 Notify RN   Glucose, capillary     Status: Abnormal   Collection Time: 02/04/17  8:51 PM  Result Value Ref Range   Glucose-Capillary 388 (H) 65 - 99 mg/dL  CBC with Differential/Platelet     Status: None   Collection Time: 02/05/17  7:10 AM  Result Value Ref Range   WBC 6.0 3.8 - 10.6 K/uL   RBC 4.66 4.40 - 5.90 MIL/uL   Hemoglobin 14.4 13.0 - 18.0 g/dL   HCT 40.9 81.1 - 91.4 %   MCV 87.9 80.0 - 100.0 fL   MCH 30.8 26.0 - 34.0 pg   MCHC 35.1 32.0 - 36.0 g/dL   RDW 78.2 95.6 - 21.3 %   Platelets 196 150 - 440 K/uL   Neutrophils Relative % 47 %   Neutro Abs 2.8 1.4 - 6.5 K/uL   Lymphocytes Relative 42 %   Lymphs Abs 2.5 1.0 - 3.6 K/uL   Monocytes Relative 6 %   Monocytes Absolute 0.4 0.2 - 1.0 K/uL   Eosinophils Relative 4 %   Eosinophils Absolute 0.2 0 - 0.7 K/uL   Basophils Relative 1 %   Basophils Absolute 0.1 0 - 0.1 K/uL  Glucose, capillary     Status: Abnormal   Collection Time: 02/05/17  7:12 AM  Result Value Ref Range   Glucose-Capillary 253 (H) 65 - 99 mg/dL   Comment 1 Notify RN     Blood Alcohol level:  Lab Results  Component Value Date   ETH <5 01/16/2017   ETH <5 12/21/2016    Metabolic Disorder Labs: Lab Results  Component Value Date  HGBA1C 7.6 (H) 01/19/2017   MPG 171.42 01/19/2017   MPG 249 11/09/2016   Lab Results  Component Value Date   PROLACTIN 24.5 (H) 09/24/2016   PROLACTIN 3.4 (L) 07/07/2016   Lab Results  Component Value Date   CHOL 122 01/19/2017   TRIG 154 (H) 01/19/2017   HDL 36 (L) 01/19/2017   CHOLHDL 3.4 01/19/2017   VLDL 31 01/19/2017   LDLCALC 55 01/19/2017   LDLCALC 59 12/28/2016    Physical Findings: AIMS: Facial and Oral Movements Muscles of Facial Expression: None,  normal Lips and Perioral Area: None, normal Jaw: None, normal Tongue: None, normal,Extremity Movements Upper (arms, wrists, hands, fingers): Mild Lower (legs, knees, ankles, toes): None, normal, Trunk Movements Neck, shoulders, hips: None, normal, Overall Severity Severity of abnormal movements (highest score from questions above): Minimal Incapacitation due to abnormal movements: None, normal Patient's awareness of abnormal movements (rate only patient's report): No Awareness, Dental Status Current problems with teeth and/or dentures?: No Does patient usually wear dentures?: No  CIWA:    COWS:  COWS Total Score: 1  Musculoskeletal: Strength & Muscle Tone: decreased Gait & Station: normal Patient leans: [name]  Psychiatric Specialty Exam: Physical Exam  Nursing note and vitals reviewed. Constitutional: He appears well-developed and well-nourished.  HENT:  Head: Normocephalic and atraumatic.  Eyes: Pupils are equal, round, and reactive to light. Conjunctivae are normal.  Neck: Normal range of motion.  Cardiovascular: Regular rhythm and normal heart sounds.   Respiratory: Effort normal and breath sounds normal. No respiratory distress.  GI: Soft.  Musculoskeletal: Normal range of motion.  Neurological: He is alert.  Skin: Skin is warm and dry.  Psychiatric: His affect is blunt. His speech is delayed. He is slowed and withdrawn. Thought content is paranoid. Cognition and memory are impaired. He expresses impulsivity. He expresses suicidal ideation. He expresses no homicidal ideation.    Review of Systems  Constitutional: Negative.   HENT: Negative.   Eyes: Negative.   Respiratory: Negative.   Cardiovascular: Negative.   Gastrointestinal: Negative.   Musculoskeletal: Negative.   Skin: Negative.   Neurological: Negative.   Psychiatric/Behavioral: Positive for depression, hallucinations, memory loss and suicidal ideas. Negative for substance abuse. The patient is nervous/anxious  and has insomnia.     Blood pressure 113/81, pulse 77, temperature 98.2 F (36.8 C), temperature source Oral, resp. rate 18, height 6' (1.829 m), weight 78.5 kg (173 lb), SpO2 99 %.Body mass index is 23.46 kg/m.  General Appearance: Disheveled  Eye Contact:  Fair  Speech:  Slow  Volume:  Decreased  Mood:  Depressed and Dysphoric  Affect:  Constricted and Depressed  Thought Process:  Disorganized  Orientation:  Full (Time, Place, and Person)  Thought Content:  Illogical, Hallucinations: Auditory and Paranoid Ideation  Suicidal Thoughts:  Yes.  without intent/plan  Homicidal Thoughts:  No  Memory:  Immediate;   Good Recent;   Fair Remote;   Fair  Judgement:  Impaired  Insight:  Shallow  Psychomotor Activity:  Decreased  Concentration:  Concentration: Poor  Recall:  Poor  Fund of Knowledge:  Poor  Language:  Fair  Akathisia:  No  Handed:  Right  AIMS (if indicated):     Assets:  Desire for Improvement  ADL's:  Impaired  Cognition:  Impaired,  Mild  Sleep:  Number of Hours: 7   Mr. Maicol has a history of schizoaffective disorder admitted for worsening of depression receiving ECT.  #Schizoaffective disorder, SI - Continue ECT - Next one on  02/01/17 - Continue clozapine 150 mg QHS, aim to optimize dose of antipsychotic monotherapy. Level pending and will hold at 150 mg.  - Prozac 40 mg.  HTN. He is on Lisinopril and Propranolol.   #Smoking cessation Continue nicotine patch 21 mg/24 hours  #DM Continue metformin 1000 mg BIDCC, Lantus 35 units at bedtime, Novolog 5 units before meals, ADA diet. Insulin sliding scale  #GERD Continue protonix EC 40 mg daily.   Disposition. TBE.   We are continuing ECT and medication changes including most recently an increase and change in his antidepressant. Treatment team aware of his multiple hospitalizations in the difficulty finding a safe place outside the hospital is considering referral to the state facility. Meanwhile work on  improving current symptoms. Diabetes remains difficult to control. I did slightly increase his insulin yesterday and I don't want him to have a low blood sugar. Encouraged to stay on his regular diet without extra food in between meals. Strongly encouraged to be out of bed and attending therapy groups and interacting with peers and staff.  01/30/2017. No medication adjustments today. 01/31/2017. No medication adjustments today.  No changes to medication. ECT today. Spent time with him before and after treatment discussing the overall plan. Treatment team rounded.  No ECT today. ECT scheduled tomorrow is canceled because of weather emergency. Supportive counseling and review of current plan with social work. Potential discharge by next week with her now next ECT scheduled for Monday.  02/05/2017. No medication changes.   Kristine Linea, MD 02/05/2017, 8:40 AM

## 2017-02-05 NOTE — Progress Notes (Signed)
Patient ID: Tony Cunningham, male   DOB: 1975-08-13, 41 y.o.   MRN: [id number] Pessimistic, rigid, needs a lot of encouragement for his ADLs; inconsistent with thoughts and statements; visible in the milieu, continues to verbalized AH now and then with passive suicidal ideation, "I feel good about ECT, the next one is on Monday.." CBG=388, SSI coverage and Lantus 35 Units given; complied with medication at bedtime.

## 2017-02-05 NOTE — Progress Notes (Signed)
Clozapine monitoring  9/14  ANC 2.8. Lab reported and patient eligible with weekly monitoring per [employer] website.   Stormy Card [facility] Clinical Pharmacist 02/05/2017 8:18 AM

## 2017-02-06 LAB — GLUCOSE, CAPILLARY
GLUCOSE-CAPILLARY: 212 mg/dL — AB (ref 65–99)
GLUCOSE-CAPILLARY: 198 mg/dL — AB (ref 65–99)
Glucose-Capillary: 94 mg/dL (ref 65–99)
Glucose-Capillary: 129 mg/dL — ABNORMAL HIGH (ref 65–99)
Glucose-Capillary: 159 mg/dL — ABNORMAL HIGH (ref 65–99)
Glucose-Capillary: 159 mg/dL — ABNORMAL HIGH (ref 65–99)

## 2017-02-06 NOTE — [facility] Suicide Risk Assessment (Signed)
[facility] INPATIENT:  Family/Significant Other Suicide Prevention Education  Suicide Prevention Education:  Contact Attempts: Rowyn Spilde, sister, [phone], has been identified by the patient as the family member/significant other with whom the patient will be residing, and identified as the person(s) who will aid the patient in the event of a mental health crisis.  With written consent from the patient, two attempts were made to provide suicide prevention education, prior to and/or following the patient's discharge.  We were unsuccessful in providing suicide prevention education.  A suicide education pamphlet was given to the patient to share with family/significant other.  Date and time of first attempt:02/06/17, 1450 Date and time of second attempt:  [name], LCSW 02/06/2017, 2:50 PM

## 2017-02-06 NOTE — [facility] Group Notes (Signed)
[facility] Group Notes:  (Nursing/MHT/Case Management/Adjunct)  Date:  02/06/2017  Time:  5:02 AM  Type of Therapy:  Psychoeducational Skills  Participation Level:  Active  Participation Quality:  Appropriate  Affect:  Appropriate  Cognitive:  Appropriate  Insight:  Appropriate  Engagement in Group:  Engaged  Modes of Intervention:  Discussion, Socialization and Support  Summary of Progress/Problems:  Chancy Milroy 02/06/2017, 5:02 AM

## 2017-02-06 NOTE — Progress Notes (Signed)
Patient did take a shower and came to Pharmacist, hospital know.

## 2017-02-06 NOTE — Progress Notes (Signed)
Patient is up on the unit more today. He is pleasant and cooperative. He does speak with some encouragement. He denies si, hi, avh. He denies pain. He smile and make eye contact. He is encouraged to take a hot shower.

## 2017-02-06 NOTE — [facility] Suicide Risk Assessment (Signed)
[facility] INPATIENT:  Family/Significant Other Suicide Prevention Education  Suicide Prevention Education:  Education Completed; Clydell Sposito, sister, [phone], has been identified by the patient as the family member/significant other with whom the patient will be residing, and identified as the person(s) who will aid the patient in the event of a mental health crisis (suicidal ideations/suicide attempt).  With written consent from the patient, the family member/significant other has been provided the following suicide prevention education, prior to the and/or following the discharge of the patient.  The suicide prevention education provided includes the following:  Suicide risk factors  Suicide prevention and interventions  National Suicide Hotline telephone number  [facility] assessment telephone number  [facility] Emergency Assistance 911  [facility] and/or Residential Mobile Crisis Unit telephone number  Request made of family/significant other to:  Remove weapons (e.g., guns, rifles, knives), all items previously/currently identified as safety concern.  No guns in the home, per Jocylyn.  Remove drugs/medications (over-the-counter, prescriptions, illicit drugs), all items previously/currently identified as a safety concern. CSW asked her to communicate to pt mother regarding locking up excess meds.  The family member/significant other verbalizes understanding of the suicide prevention education information provided.  The family member/significant other agrees to remove the items of safety concern listed above.  [name], LCSW 02/06/2017, 3:15 PM

## 2017-02-06 NOTE — Plan of Care (Signed)
Problem: Coping: Goal: Ability to cope will improve Outcome: Progressing Pt coping skills will improve this shift.

## 2017-02-06 NOTE — Progress Notes (Signed)
BDaily contact with patient to assess and evaluate symptoms and progress in treatment, Medication management and Plan [facility] MD Progress Note  02/06/2017 1:57 PM Ruby Dilone  MRN:  [id number] Subjective:  41 year old man with a history of schizoaffective disorder who came back into the hospital only shortly after his most recent discharge this time reporting hallucinations and suicidal ideation. On evaluation today patient is in bed staying withdrawn and hardly ever attending groups. He tells me his mood is very sad and feels frightened. He says he's having auditory hallucinations and active wishes that he were dead. He is not acting on any of these and is not aggressive. Patient has been compliant with medication. He admits to me that he really would rather stay in the hospital forever and does not really ever want to be placed in a group home or any other kind of even semi-independent living situation.  9/15- chart reviewed, discussed with nursing staff, pt seen. Pt still isolative, withdrawn, does not take shower, reports AH to hurt him but denies intent, med compliant, denies side effects. Next ECT on Mon.   Follow-up for Friday 14th. Patient tells me today he is feeling anxious. He is able to talk about being nervous about leaving the hospital. He doesn't go into much detail but at least engaged in a little more appropriate back and forth. Doesn't report any acute suicidal intent or plan. He was sitting on his bed brooding but at least that seems a little bit better than just laying in bed. He tells me he has been to groups today. Per nursing: Patient was visible in the day area and social with select peers. Per report he has been outside for group activity. Patient states he attends daily group meetings.   Principal Problem: Schizoaffective disorder, depressive type (HCC) Diagnosis:   Patient Active Problem List   Diagnosis Date Noted  . 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 Schizoaffective disorder, depressive type (HCC) [F25.1] 09/23/2016  . Polysubstance abuse [F19.10] 07/17/2016  . Tardive dyskinesia [G24.01] 07/16/2016  . Tobacco use disorder [F17.200] 07/07/2016  . Dyslipidemia [E78.5] 07/07/2016  . Asthma [J45.909] 07/07/2016  . HTN (hypertension) [I10] 07/06/2016  . Diabetes (HCC) [E11.9] 12/25/2010   Total Time spent with patient: 20 minutes  Past Psychiatric History: Patient has a long-standing history of mental illness but over the last year or so seems to of decompensated more than usual and has had one hospitalization after another. He did show some response to ECT and antipsychotic medication but seems to decompensate immediately whenever he is discharged.  Past Medical History:  Past Medical History:  Diagnosis Date  . Anxiety   . Asthma   . Diabetes mellitus   . High blood pressure   . Sinus complaint    History reviewed. No pertinent surgical history. Family History: History reviewed. No pertinent family history. Family Psychiatric  History: Unknown Social History:  History  Alcohol Use No     History  Drug Use No    Social History   Social History  . Marital status: Single    Spouse name: [name]  . Number of children: [name]  . Years of education: [name]   Social History Main Topics  . Smoking status: Current Every Day Smoker    Packs/day: 0.50    Types: Cigarettes  . Smokeless tobacco: Never Used  . Alcohol use No  . Drug use: No  . Sexual activity: Not Currently   Other Topics Concern  . None  Social History Narrative  . None   Additional Social History:                         Sleep: Fair  Appetite:  Poor  Current Medications: Current Facility-Administered Medications  Medication Dose Route Frequency Provider Last Rate Last Dose  . acetaminophen (TYLENOL) tablet 650 mg  650 mg Oral Q6H PRN [name], [name], MD   650 mg at 01/22/17 1247  . alum & mag hydroxide-simeth (MAALOX/MYLANTA) 200-200-20  MG/5ML suspension 30 mL  30 mL Oral Q4H PRN [name], [name], MD      . cloZAPine (CLOZARIL) tablet 150 mg  150 mg Oral QHS [name], [name], MD   150 mg at 02/05/17 2115  . feeding supplement (GLUCERNA SHAKE) (GLUCERNA SHAKE) liquid 237 mL  237 mL Oral TID BM [name], [name], MD   237 mL at 02/06/17 1006  . FLUoxetine (PROZAC) capsule 40 mg  40 mg Oral Daily [name], [name], MD   40 mg at 02/06/17 8295  . haloperidol lactate (HALDOL) injection 5 mg  5 mg Intravenous Once [name], [name], MD      . insulin aspart (novoLOG) injection 0-5 Units  0-5 Units Subcutaneous QHS [name], [name], MD   5 Units at 02/04/17 2123  . insulin aspart (novoLOG) injection 0-9 Units  0-9 Units Subcutaneous TID WC [name], [name], MD   2 Units at 02/06/17 [phone]  . insulin aspart (novoLOG) injection 5 Units  5 Units Subcutaneous TID WC [name], [name], MD   5 Units at 02/06/17 [phone]  . insulin glargine (LANTUS) injection 35 Units  35 Units Subcutaneous QHS [name], [name], MD   35 Units at 02/05/17 2115  . lisinopril (PRINIVIL,ZESTRIL) tablet 20 mg  20 mg Oral Daily [name], [name], MD   20 mg at 02/06/17 0824  . magnesium hydroxide (MILK OF MAGNESIA) suspension 30 mL  30 mL Oral Daily PRN [name], [name], MD      . metFORMIN (GLUCOPHAGE) tablet 1,000 mg  1,000 mg Oral BID WC [name], [name], MD   1,000 mg at 02/06/17 0824  . midazolam (VERSED) injection 2 mg  2 mg Intravenous Once [name], [name], MD      . montelukast (SINGULAIR) tablet 10 mg  10 mg Oral QHS [name], [name], MD   10 mg at 02/05/17 2115  . nicotine (NICODERM CQ - dosed in mg/24 hours) patch 21 mg  21 mg Transdermal Daily [name], [name], MD   21 mg at 01/30/17 0841  . pantoprazole (PROTONIX) EC tablet 40 mg  40 mg Oral Daily [name], [name], MD   40 mg at 02/06/17 0824  . propranolol (INDERAL) tablet 20 mg  20 mg Oral BID [name], [name], MD   20 mg at 02/06/17 7846    Lab Results:  Results for orders placed or performed during the hospital  encounter of 01/19/17 (from the past 48 hour(s))  Glucose, capillary     Status: Abnormal   Collection Time: 02/04/17  4:38 PM  Result Value Ref Range   Glucose-Capillary 305 (H) 65 - 99 mg/dL   Comment 1 Notify RN   Glucose, capillary     Status: Abnormal   Collection Time: 02/04/17  8:51 PM  Result Value Ref Range   Glucose-Capillary 388 (H) 65 - 99 mg/dL  CBC with Differential/Platelet     Status: None   Collection Time: 02/05/17  7:10 AM  Result Value Ref Range   WBC 6.0 3.8 - 10.6 K/uL   RBC 4.66 4.40 - 5.90 MIL/uL   Hemoglobin 14.4 13.0 - 18.0 g/dL   HCT 38.7 56.4 - 33.2 %   MCV 87.9 80.0 - 100.0 fL   MCH 30.8 26.0 - 34.0 pg   MCHC 35.1 32.0 - 36.0 g/dL   RDW 95.1 88.4 - 16.6 %   Platelets 196 150 - 440 K/uL   Neutrophils Relative % 47 %   Neutro Abs 2.8 1.4 - 6.5 K/uL   Lymphocytes Relative 42 %   Lymphs Abs 2.5 1.0 - 3.6 K/uL   Monocytes Relative 6 %   Monocytes Absolute 0.4 0.2 - 1.0 K/uL   Eosinophils Relative 4 %   Eosinophils Absolute 0.2 0 - 0.7 K/uL   Basophils Relative 1 %   Basophils Absolute 0.1 0 - 0.1 K/uL  Glucose, capillary     Status: Abnormal   Collection Time: 02/05/17  7:12 AM  Result Value Ref Range   Glucose-Capillary 253 (H) 65 - 99 mg/dL   Comment 1 Notify RN   Glucose, capillary     Status: Abnormal   Collection Time: 02/05/17 11:21 AM  Result Value Ref Range   Glucose-Capillary 151 (H) 65 - 99 mg/dL  Glucose, capillary     Status: None   Collection Time: 02/05/17  4:25 PM  Result Value Ref Range   Glucose-Capillary 94 65 - 99 mg/dL  Glucose, capillary     Status: Abnormal   Collection Time: 02/05/17  9:13 PM  Result Value Ref Range   Glucose-Capillary 129 (H) 65 - 99 mg/dL  Glucose, capillary     Status: Abnormal   Collection Time: 02/06/17  7:07 AM  Result Value Ref Range   Glucose-Capillary 159 (H) 65 - 99 mg/dL  Glucose, capillary     Status: Abnormal   Collection Time: 02/06/17 11:49 AM  Result Value Ref Range    Glucose-Capillary 159 (H) 65 - 99 mg/dL    Blood Alcohol level:  Lab Results  Component Value Date   ETH <5 01/16/2017   ETH <5 12/21/2016    Metabolic Disorder Labs: Lab Results  Component Value Date   HGBA1C 7.6 (H) 01/19/2017   MPG 171.42 01/19/2017   MPG 249 11/09/2016   Lab Results  Component Value Date   PROLACTIN 24.5 (H) 09/24/2016   PROLACTIN 3.4 (L) 07/07/2016   Lab Results  Component Value Date   CHOL 122 01/19/2017   TRIG 154 (H) 01/19/2017   HDL 36 (L) 01/19/2017   CHOLHDL 3.4 01/19/2017   VLDL 31 01/19/2017   LDLCALC 55 01/19/2017   LDLCALC 59 12/28/2016    Physical Findings: AIMS: Facial and Oral Movements Muscles of Facial Expression: None, normal Lips and Perioral Area: None, normal Jaw: None, normal Tongue: None, normal,Extremity Movements Upper (arms, wrists, hands, fingers): Mild Lower (legs, knees, ankles, toes): None, normal, Trunk Movements Neck, shoulders, hips: None, normal, Overall Severity Severity of abnormal movements (highest score from questions above): Minimal Incapacitation due to abnormal movements: None, normal Patient's awareness of abnormal movements (rate only patient's report): No Awareness, Dental Status Current problems with teeth and/or dentures?: No Does patient usually wear dentures?: No  CIWA:    COWS:  COWS Total Score: 1  Musculoskeletal: Strength & Muscle Tone: decreased Gait & Station: normal Patient leans: [name]  Psychiatric Specialty Exam: Physical Exam  Nursing note and vitals reviewed. Constitutional: He appears well-developed and well-nourished.  HENT:  Head: Normocephalic and atraumatic.  Eyes: Pupils are equal, round, and reactive to light. Conjunctivae are normal.  Neck: Normal range of motion.  Cardiovascular: Regular rhythm and normal heart sounds.   Respiratory: Effort normal and breath sounds normal. No respiratory distress.  GI: Soft.  Musculoskeletal: Normal range of motion.  Neurological:  He is alert.  Skin: Skin is warm and dry.  Psychiatric: His mood appears anxious. His affect is blunt. His speech is delayed. He is slowed and withdrawn. Thought content is paranoid. Cognition and memory are impaired. He expresses impulsivity. He expresses no homicidal and no suicidal ideation.    Review of Systems  Constitutional: Negative.   HENT: Negative.   Eyes: Negative.   Respiratory: Negative.   Cardiovascular: Negative.   Gastrointestinal: Negative.   Musculoskeletal: Negative.   Skin: Negative.   Neurological: Negative.   Psychiatric/Behavioral: Positive for depression, hallucinations, memory loss and suicidal ideas. Negative for substance abuse. The patient is nervous/anxious and has insomnia.     Blood pressure 122/81, pulse 81, temperature 98 F (36.7 C), temperature source Oral, resp. rate 18, height 6' (1.829 m), weight 78.5 kg (173 lb), SpO2 99 %.Body mass index is 23.46 kg/m.  General Appearance: Disheveled  Eye Contact:  Fair  Speech:  Slow  Volume:  Decreased  Mood:  Depressed and Dysphoric  Affect:  blunted  Thought Process:  Disorganized  Orientation:  Full (Time, Place, and Person)  Thought Content:  Illogical, Hallucinations: Auditory and Paranoid Ideation, AH telling him to hurt him  Suicidal Thoughts:  no  Homicidal Thoughts:  No  Memory:  Immediate;   Good Recent;   Fair Remote;   Fair  Judgement:  Impaired  Insight:  Shallow  Psychomotor Activity:  Decreased  Concentration:  Concentration: Poor  Recall:  Poor  Fund of Knowledge:  Poor  Language:  Fair  Akathisia:  No  Handed:  Right  AIMS (if indicated):     Assets:  Desire for Improvement  ADL's:  Impaired  Cognition:  Impaired,  Mild  Sleep:  Number of Hours: 7.15   Mr. Graig has a history of schizoaffective disorder admitted for worsening of depression receiving ECT. Pt still psychotic, unable to take care self.   #Schizoaffective disorder, SI - Continue ECT - Next one on 02/08/17 -  Continue clozapine 150 mg QHS, aim to optimize dose of antipsychotic monotherapy. Level pending and will hold at 150 mg.  - Prozac 40 mg.  HTN. He is on Lisinopril and Propranolol.   #Smoking cessation Continue nicotine patch 21 mg/24 hours  #DM Continue metformin 1000 mg BIDCC, Lantus 35 units at bedtime, Novolog 5 units before meals, ADA diet. Insulin sliding scale  #GERD Continue protonix EC 40 mg daily.   Disposition. TBE.   We are continuing ECT and medication changes including most recently an increase and change in his antidepressant. Treatment team aware of his multiple hospitalizations in the difficulty finding a safe place outside the hospital is considering referral to the state facility. Meanwhile work on improving current symptoms. Diabetes remains difficult to control. I did slightly increase his insulin yesterday and I don't want him to have a low blood sugar. Encouraged to stay on his regular diet without extra food in between meals. Strongly encouraged to be out of bed and attending therapy groups and interacting with peers and staff.  No changes to medication. ECT today. Spent time with him before and after treatment discussing the overall plan.  Gradually improving. Next  ECT scheduled for Monday. No change to medicine for today. will continue to try and engage him in supportive conversation and praise his attempts to get out of his room and work on coping skills.  I certify that the services received since the previous certification/recertification were and continue to be medically necessary as the treatment provided can be reasonably expected to improve the patient's condition; the medical record documents that the services furnished were intensive treatment services or their equivalent services, and this patient continues to need, on a daily basis, active treatment furnished directly by or requiring the supervision of inpatient psychiatric personnel.  Beverly Sessions,  MD 02/06/2017, 1:57 PMPatient ID: [name], male   DOB: 06-30-1975, 41 y.o.   MRN: [id number]

## 2017-02-06 NOTE — Progress Notes (Signed)
CSW received voicemail from pt sister, Rue Tinnel, who has spoken with pt's mother and the mother is in agreement that pt will return to live with her upon discharge. Garner Nash, MSW, LCSW Clinical Social Worker 02/06/2017 8:23 AM

## 2017-02-06 NOTE — [facility] Group Notes (Signed)
LCSW Group Therapy Note  02/06/2017 1:15pm  Type of Therapy and Topic: Group Therapy: Holding on to Grudges   Participation Level: Did Not Attend   Description of Group:  In this group patients will be asked to explore and define a grudge. Patients will be guided to discuss their thoughts, feelings, and reasons as to why people have grudges. Patients will process the impact grudges have on daily life and identify thoughts and feelings related to holding grudges. Facilitator will challenge patients to identify ways to let go of grudges and the benefits this provides. Patients will be confronted to address why one struggles letting go of grudges. Lastly, patients will identify feelings and thoughts related to what life would look like without grudges. This group will be process-oriented, with patients participating in exploration of their own experiences, giving and receiving support, and processing challenge from other group members.  Therapeutic Goals:  1. Patient will identify specific grudges related to their personal life.  2. Patient will identify feelings, thoughts, and beliefs around grudges.  3. Patient will identify how one releases grudges appropriately.  4. Patient will identify situations where they could have let go of the grudge, but instead chose to hold on.   Summary of Patient Progress:   Therapeutic Modalities:  Cognitive Behavioral Therapy  Solution Focused Therapy  Motivational Interviewing  Brief Therapy   [name], LCSW 02/06/2017 2:16 PM

## 2017-02-06 NOTE — Progress Notes (Signed)
Pt is sad and depressed. Pt states he doesn't want to leave this facility, pt says he likes it here. Pt was having crying spells, pt was encouraged to express his feelings. Pt encouraged to write his feelings down. [name] SI, HI or A/V hallucinations. Pt depression level is 7/10. Pt contracted to safety. Pt had c/o of headache 7/10, pt given Tylenol 650 mg tab po. Pt reassessed, pt says he no longer has a headache. Pt blood glucose 159, no coverage required. Pt did receive Lantus 35 units to left upper arm, verified by Daria RN. Pt med complaint, no adverse affects noted. 15 min safety checks continues.

## 2017-02-07 LAB — GLUCOSE, CAPILLARY
GLUCOSE-CAPILLARY: 154 mg/dL — AB (ref 65–99)
Glucose-Capillary: 70 mg/dL (ref 65–99)
Glucose-Capillary: 107 mg/dL — ABNORMAL HIGH (ref 65–99)
Glucose-Capillary: 333 mg/dL — ABNORMAL HIGH (ref 65–99)

## 2017-02-07 NOTE — [facility] Group Notes (Signed)
[facility] Group Notes:  (Nursing/MHT/Case Management/Adjunct)  Date:  02/07/2017  Time:  5:08 AM  Type of Therapy:  Psychoeducational Skills  Participation Level:  Active  Participation Quality:  Appropriate  Affect:  Appropriate  Cognitive:  Appropriate  Insight:  Appropriate  Engagement in Group:  Engaged  Modes of Intervention:  Discussion, Socialization and Support  Summary of Progress/Problems:  Tony Cunningham 02/07/2017, 5:08 AM

## 2017-02-07 NOTE — Progress Notes (Signed)
Patient ID: Tony Cunningham, male   DOB: 1975-08-11, 41 y.o.   MRN: [id number] LCSW Group Therapy Note  02/07/2017 1:00pm  Type of Therapy and Topic:  Group Therapy:  Feelings around Relapse and Recovery  Participation Level:  Did Not Attend   Description of Group:    Patients in this group will discuss emotions they experience before and after a relapse. They will process how experiencing these feelings, or avoidance of experiencing them, relates to having a relapse. Facilitator will guide patients to explore emotions they have related to recovery. Patients will be encouraged to process which emotions are more powerful. They will be guided to discuss the emotional reaction significant others in their lives may have to their relapse or recovery. Patients will be assisted in exploring ways to respond to the emotions of others without this contributing to a relapse.  Therapeutic Goals: 1. Patient will identify two or more emotions that lead to a relapse for them 2. Patient will identify two emotions that result when they relapse 3. Patient will identify two emotions related to recovery 4. Patient will demonstrate ability to communicate their needs through discussion and/or role plays   Summary of Patient Progress:     Therapeutic Modalities:   Cognitive Behavioral Therapy Solution-Focused Therapy Assertiveness Training Relapse Prevention Therapy   Glennon Mac, LCSW 02/07/2017 2:57 PM

## 2017-02-07 NOTE — Plan of Care (Signed)
Problem: Medication: Goal: Compliance with prescribed medication regimen will improve Outcome: Progressing Patient medication compliant   

## 2017-02-07 NOTE — Progress Notes (Addendum)
BDaily contact with patient to assess and evaluate symptoms and progress in treatment, Medication management and Plan [facility] MD Progress Note  02/07/2017 10:58 AM Wyeth Hoffer  MRN:  [id number] Subjective:  41 year old man with a history of schizoaffective disorder who came back into the hospital only shortly after his most recent discharge this time reporting hallucinations and suicidal ideation. On evaluation today patient is in bed staying withdrawn and hardly ever attending groups. He tells me his mood is very sad and feels frightened. He says he's having auditory hallucinations and active wishes that he were dead. He is not acting on any of these and is not aggressive. Patient has been compliant with medication. He admits to me that he really would rather stay in the hospital forever and does not really ever want to be placed in a group home or any other kind of even semi-independent living situation.  9/16- chart reviewed, discussed with nursing staff, pt seen. Pt is still isolative, withdrawn, depressed, has poor ADLs. reports vague AH, refuses to elaborate ; med compliant, denies side effects. Next ECT tomorrow.   Per nursing: Patient is up on the unit more today. He is pleasant and cooperative. He does speak with some encouragement. He denies si, hi, avh. He denies pain. He smile and make eye contact. Patient did take a shower . Pt is sad and depressed. Pt states he doesn't want to leave this facility, pt says he likes it here. Pt was having crying spells, pt was encouraged to express his feelings. Pt encouraged to write his feelings down. [name] SI, HI or A/V hallucinations. Pt depression level is 7/10. Pt contracted to safety. Pt had c/o of headache 7/10, pt given Tylenol 650 mg tab po. Pt reassessed, pt says he no longer has a headache. Pt blood glucose 159, no coverage required. Pt did receive Lantus 35 units to left upper arm, verified by Daria RN. Pt med complaint, no adverse affects noted. 15 min  safety checks continues.  Principal Problem: Schizoaffective disorder, depressive type (HCC) Diagnosis:   Patient Active Problem List   Diagnosis Date Noted  . 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 Schizoaffective disorder, depressive type (HCC) [F25.1] 09/23/2016  . Polysubstance abuse [F19.10] 07/17/2016  . Tardive dyskinesia [G24.01] 07/16/2016  . Tobacco use disorder [F17.200] 07/07/2016  . Dyslipidemia [E78.5] 07/07/2016  . Asthma [J45.909] 07/07/2016  . HTN (hypertension) [I10] 07/06/2016  . Diabetes (HCC) [E11.9] 12/25/2010   Total Time spent with patient: 20 minutes  Past Psychiatric History: Patient has a long-standing history of mental illness but over the last year or so seems to of decompensated more than usual and has had one hospitalization after another. He did show some response to ECT and antipsychotic medication but seems to decompensate immediately whenever he is discharged.  Past Medical History:  Past Medical History:  Diagnosis Date  . Anxiety   . Asthma   . Diabetes mellitus   . High blood pressure   . Sinus complaint    History reviewed. No pertinent surgical history. Family History: History reviewed. No pertinent family history. Family Psychiatric  History: Unknown Social History:  History  Alcohol Use No     History  Drug Use No    Social History   Social History  . Marital status: Single    Spouse name: [name]  . Number of children: [name]  . Years of education: [name]   Social History Main Topics  . Smoking status: Current Every Day Smoker  Packs/day: 0.50    Types: Cigarettes  . Smokeless tobacco: Never Used  . Alcohol use No  . Drug use: No  . Sexual activity: Not Currently   Other Topics Concern  . None   Social History Narrative  . None   Additional Social History:                         Sleep: Fair  Appetite:  Poor  Current Medications: Current Facility-Administered Medications   Medication Dose Route Frequency Provider Last Rate Last Dose  . acetaminophen (TYLENOL) tablet 650 mg  650 mg Oral Q6H PRN [name], [name], MD   650 mg at 02/06/17 2016  . alum & mag hydroxide-simeth (MAALOX/MYLANTA) 200-200-20 MG/5ML suspension 30 mL  30 mL Oral Q4H PRN [name], [name], MD      . cloZAPine (CLOZARIL) tablet 150 mg  150 mg Oral QHS [name], [name], MD   150 mg at 02/06/17 2058  . feeding supplement (GLUCERNA SHAKE) (GLUCERNA SHAKE) liquid 237 mL  237 mL Oral TID BM [name], [name], MD   237 mL at 02/07/17 0856  . FLUoxetine (PROZAC) capsule 40 mg  40 mg Oral Daily [name], [name], MD   40 mg at 02/07/17 6681  . haloperidol lactate (HALDOL) injection 5 mg  5 mg Intravenous Once [name], [name], MD      . insulin aspart (novoLOG) injection 0-5 Units  0-5 Units Subcutaneous QHS [name], [name], MD   5 Units at 02/04/17 2123  . insulin aspart (novoLOG) injection 0-9 Units  0-9 Units Subcutaneous TID WC [name], [name], MD   5 Units at 02/07/17 [phone]  . insulin aspart (novoLOG) injection 5 Units  5 Units Subcutaneous TID WC [name], [name], MD   5 Units at 02/07/17 0800  . insulin glargine (LANTUS) injection 35 Units  35 Units Subcutaneous QHS [name], [name], MD   35 Units at 02/06/17 2100  . lisinopril (PRINIVIL,ZESTRIL) tablet 20 mg  20 mg Oral Daily [name], [name], MD   20 mg at 02/07/17 [phone]  . magnesium hydroxide (MILK OF MAGNESIA) suspension 30 mL  30 mL Oral Daily PRN [name], [name], MD      . metFORMIN (GLUCOPHAGE) tablet 1,000 mg  1,000 mg Oral BID WC [name], [name], MD   1,000 mg at 02/07/17 0800  . midazolam (VERSED) injection 2 mg  2 mg Intravenous Once [name], [name], MD      . montelukast (SINGULAIR) tablet 10 mg  10 mg Oral QHS [name], [name], MD   10 mg at 02/06/17 2103  . nicotine (NICODERM CQ - dosed in mg/24 hours) patch 21 mg  21 mg Transdermal Daily [name], [name], MD   21 mg at 02/07/17 0833  . pantoprazole (PROTONIX) EC tablet 40 mg  40 mg Oral Daily  [name], [name], MD   40 mg at 02/07/17 5183  . propranolol (INDERAL) tablet 20 mg  20 mg Oral BID [name], [name], MD   20 mg at 02/07/17 4373    Lab Results:  Results for orders placed or performed during the hospital encounter of 01/19/17 (from the past 48 hour(s))  Glucose, capillary     Status: Abnormal   Collection Time: 02/05/17 11:21 AM  Result Value Ref Range   Glucose-Capillary 151 (H) 65 - 99 mg/dL  Glucose, capillary     Status: None   Collection Time: 02/05/17  4:25  PM  Result Value Ref Range   Glucose-Capillary 94 65 - 99 mg/dL  Glucose, capillary     Status: Abnormal   Collection Time: 02/05/17  9:13 PM  Result Value Ref Range   Glucose-Capillary 129 (H) 65 - 99 mg/dL  Glucose, capillary     Status: Abnormal   Collection Time: 02/06/17  7:07 AM  Result Value Ref Range   Glucose-Capillary 159 (H) 65 - 99 mg/dL  Glucose, capillary     Status: Abnormal   Collection Time: 02/06/17 11:49 AM  Result Value Ref Range   Glucose-Capillary 159 (H) 65 - 99 mg/dL  Glucose, capillary     Status: Abnormal   Collection Time: 02/06/17  4:05 PM  Result Value Ref Range   Glucose-Capillary 212 (H) 65 - 99 mg/dL  Glucose, capillary     Status: Abnormal   Collection Time: 02/06/17  8:57 PM  Result Value Ref Range   Glucose-Capillary 198 (H) 65 - 99 mg/dL  Glucose, capillary     Status: Abnormal   Collection Time: 02/07/17  7:09 AM  Result Value Ref Range   Glucose-Capillary 154 (H) 65 - 99 mg/dL    Blood Alcohol level:  Lab Results  Component Value Date   ETH <5 01/16/2017   ETH <5 12/21/2016    Metabolic Disorder Labs: Lab Results  Component Value Date   HGBA1C 7.6 (H) 01/19/2017   MPG 171.42 01/19/2017   MPG 249 11/09/2016   Lab Results  Component Value Date   PROLACTIN 24.5 (H) 09/24/2016   PROLACTIN 3.4 (L) 07/07/2016   Lab Results  Component Value Date   CHOL 122 01/19/2017   TRIG 154 (H) 01/19/2017   HDL 36 (L) 01/19/2017   CHOLHDL 3.4 01/19/2017   VLDL  31 01/19/2017   LDLCALC 55 01/19/2017   LDLCALC 59 12/28/2016    Physical Findings: AIMS: Facial and Oral Movements Muscles of Facial Expression: None, normal Lips and Perioral Area: None, normal Jaw: None, normal Tongue: None, normal,Extremity Movements Upper (arms, wrists, hands, fingers): Mild Lower (legs, knees, ankles, toes): None, normal, Trunk Movements Neck, shoulders, hips: None, normal, Overall Severity Severity of abnormal movements (highest score from questions above): Minimal Incapacitation due to abnormal movements: None, normal Patient's awareness of abnormal movements (rate only patient's report): No Awareness, Dental Status Current problems with teeth and/or dentures?: No Does patient usually wear dentures?: No  CIWA:    COWS:  COWS Total Score: 1  Musculoskeletal: Strength & Muscle Tone: decreased Gait & Station: normal Patient leans: [name]  Psychiatric Specialty Exam: Physical Exam  Nursing note and vitals reviewed. Constitutional: He appears well-developed and well-nourished.  HENT:  Head: Normocephalic and atraumatic.  Eyes: Pupils are equal, round, and reactive to light. Conjunctivae are normal.  Neck: Normal range of motion.  Cardiovascular: Regular rhythm and normal heart sounds.   Respiratory: Effort normal and breath sounds normal. No respiratory distress.  GI: Soft.  Musculoskeletal: Normal range of motion.  Neurological: He is alert.  Skin: Skin is warm and dry.  Psychiatric: His mood appears anxious. His affect is blunt. His speech is delayed. He is slowed and withdrawn. Thought content is paranoid. Cognition and memory are impaired. He expresses impulsivity. He expresses no homicidal and no suicidal ideation.    Review of Systems  Constitutional: Negative.   HENT: Negative.   Eyes: Negative.   Respiratory: Negative.   Cardiovascular: Negative.   Gastrointestinal: Negative.   Musculoskeletal: Negative.   Skin: Negative.   Neurological:  Negative.   Psychiatric/Behavioral: Positive for depression, hallucinations, memory loss and suicidal ideas. Negative for substance abuse. The patient is nervous/anxious and has insomnia.     Blood pressure 119/88, pulse 93, temperature 98 F (36.7 C), temperature source Oral, resp. rate 16, height 6' (1.829 m), weight 78.5 kg (173 lb), SpO2 99 %.Body mass index is 23.46 kg/m.  General Appearance: Disheveled  Eye Contact:  Fair  Speech:  Slow  Volume:  Decreased  Mood:  Depressed and Dysphoric  Affect:  flat  Thought Process:  Disorganized  Orientation:  Full (Time, Place, and Person)  Thought Content:  Illogical, Hallucinations: Auditory and Paranoid Ideation, AH   Suicidal Thoughts:  no  Homicidal Thoughts:  No  Memory:  Immediate;   Good Recent;   Fair Remote;   Fair  Judgement:  Impaired  Insight:  Shallow  Psychomotor Activity:  Decreased  Concentration:  Concentration: Poor  Recall:  Poor  Fund of Knowledge:  Poor  Language:  Fair  Akathisia:  No  Handed:  Right  AIMS (if indicated):     Assets:  Desire for Improvement  ADL's:  Impaired  Cognition:  Impaired,  Mild  Sleep:  Number of Hours: 6.45   Mr. Elic has a history of schizoaffective disorder admitted for worsening of depression receiving ECT. Pt still psychotic, unable to take care self.   #Schizoaffective disorder, SI - Continue ECT - Next one on 02/08/17 - Continue clozapine 150 mg QHS, aim to optimize dose of antipsychotic monotherapy. Level pending and will hold at 150 mg. Latest WBC-6.0, ANC-2.8.  - Prozac 40 mg.  HTN. He is on Lisinopril and Propranolol.   #Smoking cessation Continue nicotine patch 21 mg/24 hours  #DM Continue metformin 1000 mg BIDCC, Lantus 35 units at bedtime, Novolog 5 units before meals, ADA diet. Insulin sliding scale  #GERD Continue protonix EC 40 mg daily.   Disposition. TBE.   We are continuing ECT and medication changes including most recently an increase and change  in his antidepressant. Treatment team aware of his multiple hospitalizations in the difficulty finding a safe place outside the hospital is considering referral to the state facility. Meanwhile work on improving current symptoms. Diabetes remains difficult to control. I did slightly increase his insulin yesterday and I don't want him to have a low blood sugar. Encouraged to stay on his regular diet without extra food in between meals. Strongly encouraged to be out of bed and attending therapy groups and interacting with peers and staff.  No changes to medication. ECT today. Spent time with him before and after treatment discussing the overall plan.  Gradually improving. Next ECT scheduled for Monday. No change to medicine for today. will continue to try and engage him in supportive conversation and praise his attempts to get out of his room and work on coping skills.  I certify that the services received since the previous certification/recertification were and continue to be medically necessary as the treatment provided can be reasonably expected to improve the patient's condition; the medical record documents that the services furnished were intensive treatment services or their equivalent services, and this patient continues to need, on a daily basis, active treatment furnished directly by or requiring the supervision of inpatient psychiatric personnel.  Beverly Sessions, MD 02/07/2017, 10:58 AMPatient ID: [name], male   DOB: 12/17/1975, 41 y.o.   MRN: [id number] Patient ID: Umer Harig, male   DOB: Aug 06, 1975, 41 y.o.   MRN: [id number]

## 2017-02-07 NOTE — Progress Notes (Signed)
Patient pleasant and cooperative. Med and group compliant. Appropriate with staff and peers. Pt did not eat much at lunch. Insulin withheld, blood sugar of 70. Isolative to self and room. Encouragement and support offered. Safety checks maintained. Medications given as prescribed. Pt receptive and remains safe on unit with q 15 min checks.

## 2017-02-08 ENCOUNTER — Inpatient Hospital Stay: Payer: Medicare Other

## 2017-02-08 ENCOUNTER — Inpatient Hospital Stay: Payer: Medicare Other | Admitting: Anesthesiology

## 2017-02-08 ENCOUNTER — Other Ambulatory Visit: Payer: Self-pay | Admitting: Psychiatry

## 2017-02-08 LAB — GLUCOSE, CAPILLARY
GLUCOSE-CAPILLARY: 352 mg/dL — AB (ref 65–99)
GLUCOSE-CAPILLARY: 260 mg/dL — AB (ref 65–99)
GLUCOSE-CAPILLARY: 273 mg/dL — AB (ref 65–99)
GLUCOSE-CAPILLARY: 226 mg/dL — AB (ref 65–99)

## 2017-02-08 MED ORDER — HALOPERIDOL LACTATE 5 MG/ML IJ SOLN
INTRAMUSCULAR | Status: DC | PRN
  Administered 2017-02-08: 5 mg via INTRAVENOUS

## 2017-02-08 MED ORDER — SUCCINYLCHOLINE CHLORIDE 20 MG/ML IJ SOLN
INTRAMUSCULAR | Status: DC | PRN
  Administered 2017-02-08: 100 mg via INTRAVENOUS

## 2017-02-08 MED ORDER — MIDAZOLAM HCL 2 MG/2ML IJ SOLN
INTRAMUSCULAR | Status: AC
  Filled 2017-02-08: qty 4

## 2017-02-08 MED ORDER — HALOPERIDOL LACTATE 5 MG/ML IJ SOLN
INTRAMUSCULAR | Status: AC
  Filled 2017-02-08: qty 1

## 2017-02-08 MED ORDER — SODIUM CHLORIDE 0.9 % IV SOLN
500.0000 mL | Freq: Once | INTRAVENOUS | Status: AC
  Administered 2017-02-08: 1000 mL via INTRAVENOUS

## 2017-02-08 MED ORDER — MIDAZOLAM HCL 2 MG/2ML IJ SOLN
INTRAMUSCULAR | Status: DC | PRN
  Administered 2017-02-08: 4 mg via INTRAVENOUS

## 2017-02-08 MED ORDER — SODIUM CHLORIDE 0.9 % IV SOLN
INTRAVENOUS | Status: DC | PRN
  Administered 2017-02-08: 12:00:00 via INTRAVENOUS

## 2017-02-08 MED ORDER — MIDAZOLAM HCL 2 MG/2ML IJ SOLN: 4.0000 mg | Freq: Once | INTRAMUSCULAR | Status: DC

## 2017-02-08 MED ORDER — KETOROLAC TROMETHAMINE 30 MG/ML IJ SOLN
INTRAMUSCULAR | Status: AC
  Administered 2017-02-08: 30 mg via INTRAVENOUS
  Filled 2017-02-08: qty 1

## 2017-02-08 MED ORDER — METHOHEXITAL SODIUM 100 MG/10ML IV SOSY
PREFILLED_SYRINGE | INTRAVENOUS | Status: DC | PRN
  Administered 2017-02-08: 80 mg via INTRAVENOUS

## 2017-02-08 MED ORDER — KETOROLAC TROMETHAMINE 30 MG/ML IJ SOLN
30.0000 mg | Freq: Once | INTRAMUSCULAR | Status: AC
  Administered 2017-02-08: 30 mg via INTRAVENOUS

## 2017-02-08 NOTE — Transfer of Care (Signed)
Immediate Anesthesia Transfer of Care Note  Patient: Tony Cunningham  Procedure(s) Performed: * No procedures listed *  Patient Location: PACU  Anesthesia Type:General  Level of Consciousness: sedated and responds to stimulation  Airway & Oxygen Therapy: Patient Spontanous Breathing and Patient connected to face mask oxygen  Post-op Assessment: Report given to RN and Post -op Vital signs reviewed and stable  Post vital signs: Reviewed and stable  Last Vitals:  Vitals:   02/08/17 0943 02/08/17 1230  BP: 135/84 105/69  Pulse: 64 80  Resp: 18 18  Temp: 36.6 C   SpO2: 99% 100%    Last Pain:  Vitals:   02/08/17 0943  TempSrc: Oral  PainSc:       Patients Stated Pain Goal: 0 (02/08/17 0120)  Complications: No apparent anesthesia complications

## 2017-02-08 NOTE — [facility] Group Notes (Signed)
[facility] Group Notes:  (Nursing/MHT/Case Management/Adjunct)  Date:  02/08/2017  Time:  6:30 PM  Type of Therapy:  Psychoeducational Skills  Participation Level:  Did Not Attend  Judene Companion 02/08/2017, 6:30 PM

## 2017-02-08 NOTE — Procedures (Signed)
ECT SERVICES Physician's Interval Evaluation & Treatment Note  Patient Identification: Tony Cunningham MRN:  [id number] Date of Evaluation:  02/08/2017 TX #: [id number]  MADRS:   MMSE:   P.E. Findings:  No change to physical exam. Heart and lungs normal. Vitals normal.  Psychiatric Interval Note:  Flat depressed mood slow thinking hallucinations no active suicidal ideation  Subjective:  Patient is a 41 y.o. male seen for evaluation for Electroconvulsive Therapy. Still depressed  Treatment Summary:   []   Right Unilateral             []  Bilateral   % Energy : 1.0 ms 60%   Impedance: 1090 ohms  Seizure Energy Index: No reading  Postictal Suppression Index: No reading  Seizure Concordance Index: 88%  Medications  Pre Shock: Brevital 80 mg succinylcholine 100 mg  Post Shock: Versed 4 mg Haldol 5 mg  Seizure Duration: 40 seconds by EMG 68 seconds by EEG   Comments: Follow-up Wednesday and Friday   Lungs:  [x]   Clear to auscultation               []  Other:   Heart:    [x]   Regular rhythm             []  irregular rhythm    [x]   Previous H&P reviewed, patient examined and there are NO CHANGES                 []   Previous H&P reviewed, patient examined and there are changes noted.   Monday, MD 9/17/201812:11 PM

## 2017-02-08 NOTE — Progress Notes (Signed)
BDaily contact with patient to assess and evaluate symptoms and progress in treatment, Medication management and Plan [facility] MD Progress Note  02/08/2017 10:02 PM Tony Cunningham  MRN:  [id number] Subjective:  41 year old man with a history of schizoaffective disorder who came back into the hospital only shortly after his most recent discharge this time reporting hallucinations and suicidal ideation. On evaluation today patient is in bed staying withdrawn and hardly ever attending groups. He tells me his mood is very sad and feels frightened. He says he's having auditory hallucinations and active wishes that he were dead. He is not acting on any of these and is not aggressive. Patient has been compliant with medication. He admits to me that he really would rather stay in the hospital forever and does not really ever want to be placed in a group home or any other kind of even semi-independent living situation.  9/16- chart reviewed, discussed with nursing staff, pt seen. Pt is still isolative, withdrawn, depressed, has poor ADLs. reports vague AH, refuses to elaborate ; med compliant, denies side effects. Next ECT tomorrow.   Follow-up note for September 17. ECT this morning without any complication. Patient continues to be vague about his symptoms. When I saw him this evening he had first told me he was feeling okay and then later said that he was still having hallucinations. Denies any current suicidal ideation but admits that he is still very nervous. At least he is getting up out of his room and taking care of his hygiene little better than previously. Per nursing: Patient is up on the unit more today. He is pleasant and cooperative. He does speak with some encouragement. He denies si, hi, avh. He denies pain. He smile and make eye contact. Patient did take a shower . Pt is sad and depressed. Pt states he doesn't want to leave this facility, pt says he likes it here. Pt was having crying spells, pt was  encouraged to express his feelings. Pt encouraged to write his feelings down. [name] SI, HI or A/V hallucinations. Pt depression level is 7/10. Pt contracted to safety. Pt had c/o of headache 7/10, pt given Tylenol 650 mg tab po. Pt reassessed, pt says he no longer has a headache. Pt blood glucose 159, no coverage required. Pt did receive Lantus 35 units to left upper arm, verified by Daria RN. Pt med complaint, no adverse affects noted. 15 min safety checks continues.  Principal Problem: Schizoaffective disorder, depressive type (HCC) Diagnosis:   Patient Active Problem List   Diagnosis Date Noted  . 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 Schizoaffective disorder, depressive type (HCC) [F25.1] 09/23/2016  . Polysubstance abuse [F19.10] 07/17/2016  . Tardive dyskinesia [G24.01] 07/16/2016  . Tobacco use disorder [F17.200] 07/07/2016  . Dyslipidemia [E78.5] 07/07/2016  . Asthma [J45.909] 07/07/2016  . HTN (hypertension) [I10] 07/06/2016  . Diabetes (HCC) [E11.9] 12/25/2010   Total Time spent with patient: 20 minutes  Past Psychiatric History: Patient has a long-standing history of mental illness but over the last year or so seems to of decompensated more than usual and has had one hospitalization after another. He did show some response to ECT and antipsychotic medication but seems to decompensate immediately whenever he is discharged.  Past Medical History:  Past Medical History:  Diagnosis Date  . Anxiety   . Asthma   . Diabetes mellitus   . High blood pressure   . Sinus complaint    History reviewed. No pertinent surgical history. Family  History: History reviewed. No pertinent family history. Family Psychiatric  History: Unknown Social History:  History  Alcohol Use No     History  Drug Use No    Social History   Social History  . Marital status: Single    Spouse name: [name]  . Number of children: [name]  . Years of education: [name]   Social History Main  Topics  . Smoking status: Current Every Day Smoker    Packs/day: 0.50    Types: Cigarettes  . Smokeless tobacco: Never Used  . Alcohol use No  . Drug use: No  . Sexual activity: Not Currently   Other Topics Concern  . None   Social History Narrative  . None   Additional Social History:                         Sleep: Fair  Appetite:  Poor  Current Medications: Current Facility-Administered Medications  Medication Dose Route Frequency Provider Last Rate Last Dose  . acetaminophen (TYLENOL) tablet 650 mg  650 mg Oral Q6H PRN Rokhaya Quinn, [name], MD   650 mg at 02/07/17 1927  . alum & mag hydroxide-simeth (MAALOX/MYLANTA) 200-200-20 MG/5ML suspension 30 mL  30 mL Oral Q4H PRN Graesyn Schreifels,  T, MD      . cloZAPine (CLOZARIL) tablet 150 mg  150 mg Oral QHS Jahzaria Vary,  T, MD   150 mg at 02/08/17 2100  . feeding supplement (GLUCERNA SHAKE) (GLUCERNA SHAKE) liquid 237 mL  237 mL Oral TID BM Anjelita Sheahan, [name], MD   237 mL at 02/08/17 2101  . FLUoxetine (PROZAC) capsule 40 mg  40 mg Oral Daily Avanelle Pixley T, MD   40 mg at 02/08/17 1326  . haloperidol lactate (HALDOL) injection 5 mg  5 mg Intravenous Once Jarmaine Ehrler T, MD      . insulin aspart (novoLOG) injection 0-5 Units  0-5 Units Subcutaneous QHS Suhaila Troiano, [name], MD   3 Units at 02/08/17 2104  . insulin aspart (novoLOG) injection 0-9 Units  0-9 Units Subcutaneous TID WC Pritesh Sobecki, [name], MD   5 Units at 02/08/17 1630  . insulin aspart (novoLOG) injection 5 Units  5 Units Subcutaneous TID WC Gotham Raden, [name], MD   5 Units at 02/08/17 1630  . insulin glargine (LANTUS) injection 35 Units  35 Units Subcutaneous QHS Canaan Holzer, [name], MD   35 Units at 02/08/17 2102  . lisinopril (PRINIVIL,ZESTRIL) tablet 20 mg  20 mg Oral Daily Kaleeah Gingerich, [name], MD   20 mg at 02/08/17 0825  . magnesium hydroxide (MILK OF MAGNESIA) suspension 30 mL  30 mL Oral Daily PRN Duff Pozzi T, MD      . metFORMIN (GLUCOPHAGE) tablet 1,000 mg  1,000 mg Oral BID  WC Efrat Zuidema T, MD   1,000 mg at 02/08/17 1630  . midazolam (VERSED) injection 2 mg  2 mg Intravenous Once Tajee Savant T, MD      . midazolam (VERSED) injection 4 mg  4 mg Intravenous Once Taiyo Kozma T, MD      . montelukast (SINGULAIR) tablet 10 mg  10 mg Oral QHS , [name], MD   10 mg at 02/08/17 2101  . nicotine (NICODERM CQ - dosed in mg/24 hours) patch 21 mg  21 mg Transdermal Daily [name], [name], MD   21 mg at 02/07/17 0833  . pantoprazole (PROTONIX) EC tablet 40 mg  40 mg Oral Daily , [name], MD  40 mg at 02/08/17 0825  . propranolol (INDERAL) tablet 20 mg  20 mg Oral BID Dorismar Chay, [name], MD   20 mg at 02/08/17 1633    Lab Results:  Results for orders placed or performed during the hospital encounter of 01/19/17 (from the past 48 hour(s))  Glucose, capillary     Status: Abnormal   Collection Time: 02/07/17  7:09 AM  Result Value Ref Range   Glucose-Capillary 154 (H) 65 - 99 mg/dL  Glucose, capillary     Status: None   Collection Time: 02/07/17 11:52 AM  Result Value Ref Range   Glucose-Capillary 70 65 - 99 mg/dL  Glucose, capillary     Status: Abnormal   Collection Time: 02/07/17  4:32 PM  Result Value Ref Range   Glucose-Capillary 107 (H) 65 - 99 mg/dL  Glucose, capillary     Status: Abnormal   Collection Time: 02/07/17  8:56 PM  Result Value Ref Range   Glucose-Capillary 333 (H) 65 - 99 mg/dL  Glucose, capillary     Status: Abnormal   Collection Time: 02/08/17  6:57 AM  Result Value Ref Range   Glucose-Capillary 352 (H) 65 - 99 mg/dL  Glucose, capillary     Status: Abnormal   Collection Time: 02/08/17  1:22 PM  Result Value Ref Range   Glucose-Capillary 260 (H) 65 - 99 mg/dL  Glucose, capillary     Status: Abnormal   Collection Time: 02/08/17  4:27 PM  Result Value Ref Range   Glucose-Capillary 273 (H) 65 - 99 mg/dL   Comment 1 Notify RN   Glucose, capillary     Status: Abnormal   Collection Time: 02/08/17  8:36 PM  Result Value Ref  Range   Glucose-Capillary 226 (H) 65 - 99 mg/dL    Blood Alcohol level:  Lab Results  Component Value Date   ETH <5 01/16/2017   ETH <5 12/21/2016    Metabolic Disorder Labs: Lab Results  Component Value Date   HGBA1C 7.6 (H) 01/19/2017   MPG 171.42 01/19/2017   MPG 249 11/09/2016   Lab Results  Component Value Date   PROLACTIN 24.5 (H) 09/24/2016   PROLACTIN 3.4 (L) 07/07/2016   Lab Results  Component Value Date   CHOL 122 01/19/2017   TRIG 154 (H) 01/19/2017   HDL 36 (L) 01/19/2017   CHOLHDL 3.4 01/19/2017   VLDL 31 01/19/2017   LDLCALC 55 01/19/2017   LDLCALC 59 12/28/2016    Physical Findings: AIMS: Facial and Oral Movements Muscles of Facial Expression: None, normal Lips and Perioral Area: None, normal Jaw: None, normal Tongue: None, normal,Extremity Movements Upper (arms, wrists, hands, fingers): Mild Lower (legs, knees, ankles, toes): None, normal, Trunk Movements Neck, shoulders, hips: None, normal, Overall Severity Severity of abnormal movements (highest score from questions above): Minimal Incapacitation due to abnormal movements: None, normal Patient's awareness of abnormal movements (rate only patient's report): No Awareness, Dental Status Current problems with teeth and/or dentures?: No Does patient usually wear dentures?: No  CIWA:    COWS:  COWS Total Score: 1  Musculoskeletal: Strength & Muscle Tone: decreased Gait & Station: normal Patient leans: [name]  Psychiatric Specialty Exam: Physical Exam  Nursing note and vitals reviewed. Constitutional: He appears well-developed and well-nourished.  HENT:  Head: Normocephalic and atraumatic.  Eyes: Pupils are equal, round, and reactive to light. Conjunctivae are normal.  Neck: Normal range of motion.  Cardiovascular: Regular rhythm and normal heart sounds.   Respiratory: Effort normal and breath sounds  normal. No respiratory distress.  GI: Soft.  Musculoskeletal: Normal range of motion.   Neurological: He is alert.  Skin: Skin is warm and dry.  Psychiatric: His mood appears anxious. His affect is blunt. His speech is delayed. He is slowed and withdrawn. Thought content is paranoid. Cognition and memory are impaired. He expresses impulsivity. He expresses no homicidal and no suicidal ideation.    Review of Systems  Constitutional: Negative.   HENT: Negative.   Eyes: Negative.   Respiratory: Negative.   Cardiovascular: Negative.   Gastrointestinal: Negative.   Musculoskeletal: Negative.   Skin: Negative.   Neurological: Negative.   Psychiatric/Behavioral: Positive for depression, hallucinations, memory loss and suicidal ideas. Negative for substance abuse. The patient is nervous/anxious and has insomnia.     Blood pressure 121/76, pulse 79, temperature 98.2 F (36.8 C), temperature source Oral, resp. rate 18, height 6' (1.829 m), weight 77.1 kg (170 lb), SpO2 97 %.Body mass index is 23.06 kg/m.  General Appearance: Disheveled  Eye Contact:  Fair  Speech:  Slow  Volume:  Decreased  Mood:  Depressed and Dysphoric  Affect:  flat  Thought Process:  Disorganized  Orientation:  Full (Time, Place, and Person)  Thought Content:  Illogical, Hallucinations: Auditory and Paranoid Ideation, AH   Suicidal Thoughts:  no  Homicidal Thoughts:  No  Memory:  Immediate;   Good Recent;   Fair Remote;   Fair  Judgement:  Impaired  Insight:  Shallow  Psychomotor Activity:  Decreased  Concentration:  Concentration: Poor  Recall:  Poor  Fund of Knowledge:  Poor  Language:  Fair  Akathisia:  No  Handed:  Right  AIMS (if indicated):     Assets:  Desire for Improvement  ADL's:  Impaired  Cognition:  Impaired,  Mild  Sleep:  Number of Hours: 7   Mr. Elihue has a history of schizoaffective disorder admitted for worsening of depression receiving ECT. Pt still psychotic, unable to take care self.   #Schizoaffective disorder, SI - Continue ECT - Next one on 02/08/17 - Continue  clozapine 150 mg QHS, aim to optimize dose of antipsychotic monotherapy. Level pending and will hold at 150 mg. Latest WBC-6.0, ANC-2.8.  - Prozac 40 mg.  HTN. He is on Lisinopril and Propranolol.   #Smoking cessation Continue nicotine patch 21 mg/24 hours  #DM Continue metformin 1000 mg BIDCC, Lantus 35 units at bedtime, Novolog 5 units before meals, ADA diet. Insulin sliding scale  #GERD Continue protonix EC 40 mg daily.   Disposition. TBE.   We are continuing ECT and medication changes including most recently an increase and change in his antidepressant. Treatment team aware of his multiple hospitalizations in the difficulty finding a safe place outside the hospital is considering referral to the state facility. Meanwhile work on improving current symptoms. Diabetes remains difficult to control. I did slightly increase his insulin yesterday and I don't want him to have a low blood sugar. Encouraged to stay on his regular diet without extra food in between meals. Strongly encouraged to be out of bed and attending therapy groups and interacting with peers and staff.  No changes to medication. ECT today. Spent time with him before and after treatment discussing the overall plan.  Gradually improving. Next ECT scheduled for Monday. No change to medicine for today. will continue to try and engage him in supportive conversation and praise his attempts to get out of his room and work on coping skills.  He is still on the schedule  for ECT Wednesday and Friday of this week although I'm hoping that possibly we may be getting to a point of looking at discharge. He still stays very nervous about it butI think in his current state he should be able to function if we could just get him to stop being so anxious about everything. No change to medication for today.  I certify that the services received since the previous certification/recertification were and continue to be medically necessary as the  treatment provided can be reasonably expected to improve the patient's condition; the medical record documents that the services furnished were intensive treatment services or their equivalent services, and this patient continues to need, on a daily basis, active treatment furnished directly by or requiring the supervision of inpatient psychiatric personnel.  Mordecai Rasmussen, MD 02/08/2017, 10:02 PMPatient ID: [name], male   DOB: January 25, 1976, 41 y.o.   MRN: [id number] Patient ID: Vann Okerlund, male   DOB: 04/05/1976, 41 y.o.   MRN: [id number]

## 2017-02-08 NOTE — Progress Notes (Addendum)
Pt is very anxious, fearful and having crying spells, due to not wanting to discharge home with mother. Pt was encouraged to verbalize the cause of his feelings. Pt could not verbalize reasons. Pt was given pen and paper to write down his feelings. Pt wrote it is hectic at his mother's house, could not elaborate on what makes it hectic. Pt is fearful he won't be able to take care of himself. Pt does not want to be discharged, he wants to stay at his facility. Pt was encouraged to speak with doctor in the morning about his feelings on being discharged. Pt was med compliant. Pt denies SI, HI or A/V hallucinations, is having depression 7/10 and anxiety. Pt was having right shoulder pain 7/10, pt administered Tylenol 650 mg tabs po. Pt later reassessed and stated pain has improved but could tell the level. Pt was encouraged to contact nurse if pain or discomfort returns or increases. Pt verbalized understanding. Pt blood glucose was 333, 4 units of Novolog was given SubQ upper right anterior arm. Pt contracted to safety. 15 min safety checks continues. Pt is NPO after midnight due to ECT in the am.

## 2017-02-08 NOTE — Anesthesia Preprocedure Evaluation (Signed)
Anesthesia Evaluation  Patient identified by MRN, date of birth, ID band Patient awake    Reviewed: Allergy & Precautions, NPO status , Patient's Chart, lab work & pertinent test results  History of Anesthesia Complications Negative for: history of anesthetic complications  Airway Mallampati: II  TM Distance: >3 FB Neck ROM: Full    Dental  (+) Poor Dentition, Chipped   Pulmonary asthma , Current Smoker,    breath sounds clear to auscultation- rhonchi (-) wheezing      Cardiovascular hypertension, Pt. on medications (-) CAD, (-) Past MI and (-) Cardiac Stents  Rhythm:Regular Rate:Normal - Systolic murmurs and - Diastolic murmurs    Neuro/Psych PSYCHIATRIC DISORDERS Anxiety Depression Schizophrenia negative neurological ROS     GI/Hepatic negative GI ROS, Neg liver ROS,   Endo/Other  diabetes, Insulin Dependent  Renal/GU negative Renal ROS     Musculoskeletal negative musculoskeletal ROS (+)   Abdominal (+) - obese,   Peds  Hematology negative hematology ROS (+)   Anesthesia Other Findings   Reproductive/Obstetrics                             Anesthesia Physical  Anesthesia Plan  ASA: III  Anesthesia Plan: General   Post-op Pain Management:    Induction: Intravenous  PONV Risk Score and Plan:   Airway Management Planned: Mask  Additional Equipment:   Intra-op Plan:   Post-operative Plan:   Informed Consent: I have reviewed the patients History and Physical, chart, labs and discussed the procedure including the risks, benefits and alternatives for the proposed anesthesia with the patient or authorized representative who has indicated his/her understanding and acceptance.   Dental advisory given  Plan Discussed with: CRNA and Anesthesiologist  Anesthesia Plan Comments: (Patient consented for risks of anesthesia including but not limited to:  - adverse reactions to  medications - damage to teeth, lips or other oral mucosa - sore throat or hoarseness - Damage to heart, brain, lungs or loss of life  Patient voiced understanding.)        Anesthesia Quick Evaluation

## 2017-02-08 NOTE — Progress Notes (Signed)
Patient not answering any of his questions.  Report given to [employer] [facility] RN

## 2017-02-08 NOTE — H&P (Signed)
Tony Cunningham is an 41 y.o. male.   Chief Complaint: Continues to have hallucinations but she is thinking HPI: Recurrent depression as part of what appears to be schizoid disorder  Past Medical History:  Diagnosis Date  . Anxiety   . Asthma   . Diabetes mellitus   . High blood pressure   . Sinus complaint     History reviewed. No pertinent surgical history.  History reviewed. No pertinent family history. Social History:  reports that he has been smoking Cigarettes.  He has been smoking about 0.50 packs per day. He has never used smokeless tobacco. He reports that he does not drink alcohol or use drugs.  Allergies: No Known Allergies  Medications Prior to Admission  Medication Sig Dispense Refill  . amantadine (SYMMETREL) 100 MG capsule Take 1 capsule (100 mg total) by mouth 2 (two) times daily. For prevention of drug induced tremors 60 capsule 0  . benztropine (COGENTIN) 1 MG tablet Take 1 tablet (1 mg total) by mouth 2 (two) times daily. For prevention of drug induced tremors 60 tablet 0  . cholecalciferol 400 units tablet Take 1 tablet (400 Units total) by mouth daily. For bone health 30 each 0  . citalopram (CELEXA) 20 MG tablet Take 1 tablet (20 mg total) by mouth daily. For depression 30 tablet 0  . cloZAPine (CLOZARIL) 50 MG tablet Take 3 tablets (150 mg total) by mouth at bedtime. For mood control 90 tablet 0  . cyclobenzaprine (FLEXERIL) 5 MG tablet Take 1 tablet (5 mg total) by mouth 3 (three) times daily as needed for muscle spasms. 15 tablet 0  . diclofenac sodium (VOLTAREN) 1 % GEL Apply 2 g topically 4 (four) times daily. For arthritis pain    . insulin aspart (NOVOLOG) 100 UNIT/ML injection Inject 6 Units into the skin 3 (three) times daily with meals. 10 mL 11  . insulin glargine (LANTUS) 100 UNIT/ML injection Inject 0.45 mLs (45 Units total) into the skin at bedtime. For diabetic management 10 mL 0  . insulin glargine (LANTUS) 100 UNIT/ML injection Inject 0.4 mLs (40  Units total) into the skin at bedtime. 10 mL 11  . lisinopril (PRINIVIL,ZESTRIL) 20 MG tablet Take 1 tablet (20 mg total) by mouth daily. For high blood pressure 30 tablet 0  . metFORMIN (GLUCOPHAGE) 1000 MG tablet Take 1 tablet (1,000 mg total) by mouth 2 (two) times daily with a meal. For diabetes management 60 tablet 0  . montelukast (SINGULAIR) 10 MG tablet Take 1 tablet (10 mg total) by mouth at bedtime. For shortness of breath 30 tablet 0  . nicotine polacrilex (NICORETTE) 2 MG gum Take 1 each (2 mg total) by mouth as needed for smoking cessation. 100 tablet 0  . pantoprazole (PROTONIX) 40 MG tablet Take 1 tablet (40 mg total) by mouth daily. For acid reflux 30 tablet 0  . polyethylene glycol (MIRALAX / GLYCOLAX) packet Take 17 g by mouth daily. For constipation 14 each 0  . propranolol (INDERAL) 20 MG tablet Take 1 tablet (20 mg total) by mouth 2 (two) times daily. For high blood pressure 60 tablet 0  . simvastatin (ZOCOR) 20 MG tablet Take 1 tablet (20 mg total) by mouth daily at 6 PM. For high Cholesterol 30 tablet 0  . temazepam (RESTORIL) 7.5 MG capsule Take 1 capsule (7.5 mg total) by mouth at bedtime. For sleep 14 capsule 0    Results for orders placed or performed during the hospital encounter of 01/19/17 (from the  past 48 hour(s))  Glucose, capillary     Status: Abnormal   Collection Time: 02/06/17  4:05 PM  Result Value Ref Range   Glucose-Capillary 212 (H) 65 - 99 mg/dL  Glucose, capillary     Status: Abnormal   Collection Time: 02/06/17  8:57 PM  Result Value Ref Range   Glucose-Capillary 198 (H) 65 - 99 mg/dL  Glucose, capillary     Status: Abnormal   Collection Time: 02/07/17  7:09 AM  Result Value Ref Range   Glucose-Capillary 154 (H) 65 - 99 mg/dL  Glucose, capillary     Status: None   Collection Time: 02/07/17 11:52 AM  Result Value Ref Range   Glucose-Capillary 70 65 - 99 mg/dL  Glucose, capillary     Status: Abnormal   Collection Time: 02/07/17  4:32 PM  Result  Value Ref Range   Glucose-Capillary 107 (H) 65 - 99 mg/dL  Glucose, capillary     Status: Abnormal   Collection Time: 02/07/17  8:56 PM  Result Value Ref Range   Glucose-Capillary 333 (H) 65 - 99 mg/dL  Glucose, capillary     Status: Abnormal   Collection Time: 02/08/17  6:57 AM  Result Value Ref Range   Glucose-Capillary 352 (H) 65 - 99 mg/dL   No results found.  Review of Systems  Constitutional: Negative.   HENT: Negative.   Eyes: Negative.   Respiratory: Negative.   Cardiovascular: Negative.   Gastrointestinal: Negative.   Musculoskeletal: Negative.   Skin: Negative.   Neurological: Negative.   Psychiatric/Behavioral: Positive for depression, hallucinations and memory loss. Negative for substance abuse and suicidal ideas. The patient is nervous/anxious. The patient does not have insomnia.     Blood pressure 135/84, pulse 64, temperature 97.9 F (36.6 C), temperature source Oral, resp. rate 18, height 6' (1.829 m), weight 77.1 kg (170 lb), SpO2 99 %. Physical Exam  Nursing note and vitals reviewed. Constitutional: He appears well-developed and well-nourished.  HENT:  Head: Normocephalic and atraumatic.  Eyes: Pupils are equal, round, and reactive to light. Conjunctivae are normal.  Neck: Normal range of motion.  Cardiovascular: Regular rhythm and normal heart sounds.   Respiratory: Effort normal and breath sounds normal. No respiratory distress.  GI: Soft.  Musculoskeletal: Normal range of motion.  Neurological: He is alert.  Skin: Skin is warm and dry.  Psychiatric: Judgment normal. His affect is blunt. His speech is delayed. He is slowed. Cognition and memory are normal. He expresses no homicidal and no suicidal ideation.     Assessment/Plan Continue ECT treatment and medication management 3 times a week treatment monitoring improvement with a goal of getting him discharged from the hospital  Mordecai Rasmussen, MD 02/08/2017, 12:09 PM

## 2017-02-08 NOTE — Anesthesia Postprocedure Evaluation (Signed)
Anesthesia Post Note  Patient: Tony Cunningham  Procedure(s) Performed: * No procedures listed *  Patient location during evaluation: PACU Anesthesia Type: General Level of consciousness: awake Pain management: pain level controlled Vital Signs Assessment: post-procedure vital signs reviewed and stable Respiratory status: spontaneous breathing Cardiovascular status: stable Anesthetic complications: no     Last Vitals:  Vitals:   02/08/17 1240 02/08/17 1250  BP: 115/77 104/77  Pulse: 86 78  Resp: 20 (!) 0  Temp:    SpO2: 99% 97%    Last Pain:  Vitals:   02/08/17 1250  TempSrc:   PainSc: Asleep                 VAN STAVEREN,Mira Balon

## 2017-02-08 NOTE — Plan of Care (Signed)
Problem: Self-Concept: Goal: Ability to verbalize positive feelings about self will improve Outcome: Progressing Pt will verbalize at least 2 positive things about self this shift.

## 2017-02-08 NOTE — Plan of Care (Signed)
Problem: Coping: Goal: Ability to cope will improve Outcome: Not Progressing Patient is very anxious about discharge.

## 2017-02-08 NOTE — Progress Notes (Signed)
Patient back from ECT.Patient looks little drowsy.Had lunch.Comliant with medications.Patient stated that he is anxious about going home.States "I am scared of taking care of myself'.Support & encouragement given.

## 2017-02-09 ENCOUNTER — Other Ambulatory Visit: Payer: Self-pay | Admitting: Psychiatry

## 2017-02-09 LAB — GLUCOSE, CAPILLARY
GLUCOSE-CAPILLARY: 346 mg/dL — AB (ref 65–99)
GLUCOSE-CAPILLARY: 236 mg/dL — AB (ref 65–99)
Glucose-Capillary: 384 mg/dL — ABNORMAL HIGH (ref 65–99)
Glucose-Capillary: 288 mg/dL — ABNORMAL HIGH (ref 65–99)

## 2017-02-09 MED ORDER — INSULIN GLARGINE 100 UNIT/ML ~~LOC~~ SOLN
40.0000 [IU] | Freq: Every day | SUBCUTANEOUS | Status: DC
  Administered 2017-02-09: 40 [IU] via SUBCUTANEOUS
  Filled 2017-02-09 (×2): qty 0.4

## 2017-02-09 NOTE — Plan of Care (Signed)
Problem: Activity: Goal: Imbalance in normal sleep/wake cycle will improve Outcome: Progressing Pt will sleep 6 or more uninterrupted hours this shift.

## 2017-02-09 NOTE — Plan of Care (Signed)
Problem: Education: Goal: Ability to make informed decisions regarding treatment will improve Outcome: Not Progressing Patient has to be encouraged on a daily basis to perform ADL's, patient refuses most days.  Problem: Medication: Goal: Compliance with prescribed medication regimen will improve Outcome: Progressing Patient is compliant with medication regimen.  Problem: Self-Concept: Goal: Ability to disclose and discuss suicidal ideas will improve Outcome: Progressing Patient endorses AH but denies SI.  Problem: Activity: Goal: Interest or engagement in leisure activities will improve Outcome: Not Progressing Patient has to be strongly encouraged to engage in activities.

## 2017-02-09 NOTE — Progress Notes (Signed)
Pt is very anxious, he says he is nervous about being discharged. Pt doesn't want to discharge, he says he doesn't feel he is ready. Pt says he is going to talk to the doctor in the morning and tell them he isn't ready to leave. Pt encouraged to express his feeling for not wanting to discharge. Pt could not verbalize reasons. Pt denies SI, HI or A/V hallucinations. Pt did contract to safety. Pt med compliant, no adverse affects noted. 15 min safety checks continues.

## 2017-02-09 NOTE — Progress Notes (Signed)
Inpatient Diabetes Program Recommendations  AACE/ADA: New Consensus Statement on Inpatient Glycemic Control (2015)  Target Ranges:  Prepandial:   less than 140 mg/dL      Peak postprandial:   less than 180 mg/dL (1-2 hours)      Critically ill patients:  140 - 180 mg/dL   Results for Tony Cunningham, ETHINGTON (MRN [id number]) as of 02/09/2017 09:20  Ref. Range 02/08/2017 06:57 02/08/2017 13:22 02/08/2017 16:27 02/08/2017 20:36 02/09/2017 06:59  Glucose-Capillary Latest Ref Range: 65 - 99 mg/dL 086 (H) 578 (H) 469 (H) 226 (H) 384 (H)   Review of Glycemic Control  Current orders for Inpatient glycemic control: Lantus 35 units QHS, Novolog 5 units TID with meals, Novolog 0-9 units TID with meals, Novolog 0-5 units QHS, Metformin 1000 mg BID  Inpatient Diabetes Program Recommendations:  Insulin - Basal: Please consider increasing Lantus to 40 units QHS. Insulin - Meal Coverage: Please consider increasing meal coverage to Novolog 8 units TID with meals. Insulin-Correction: Please consider increasing Novolog correction to moderate scale (0-15 units).  Thanks, Orlando Penner, RN, MSN, CDE Diabetes Coordinator Inpatient Diabetes Program [phone] (Team Pager from 8am to 5pm)

## 2017-02-09 NOTE — Progress Notes (Signed)
Patient is alert and oriented to person and place. Skin is warm, dry and intact. Denies SI but endorses AH and states that the voices are telling him to jump off of a bridge. Patient verbally contracts for safety. No limitations to all four extremities noted. Encouraged patient to take care of personal hygiene, met with resistance at first, then patient complied with request. Patient was observed ambulating in hall during the shift with a steady gait. Attends meals and is compliant with medications. BS and BP monitored and medicated per MD orders. Milieu remains therapeutic. Patient will be monitored and physician notified of any acute changes.

## 2017-02-09 NOTE — Plan of Care (Signed)
Problem: Education: Goal: Ability to make informed decisions regarding treatment will improve Outcome: Progressing Patient is participating in assessment and follows health rules and medication regimen  Problem: Self-Concept: Goal: Ability to disclose and discuss suicidal ideas will improve Outcome: Progressing Patient states that he is not thinking of harming himself or others

## 2017-02-09 NOTE — Progress Notes (Signed)
BDaily contact with patient to assess and evaluate symptoms and progress in treatment, Medication management and Plan [facility] MD Progress Note  02/09/2017 9:11 PM Vermon Cunningham  MRN:  [id number] Subjective:  41 year old man with a history of schizoaffective disorder who came back into the hospital only shortly after his most recent discharge this time reporting hallucinations and suicidal ideation. On evaluation today patient is in bed staying withdrawn and hardly ever attending groups. He tells me his mood is very sad and feels frightened. He says he's having auditory hallucinations and active wishes that he were dead. He is not acting on any of these and is not aggressive. Patient has been compliant with medication. He admits to me that he really would rather stay in the hospital forever and does not really ever want to be placed in a group home or any other kind of even semi-independent living situation.  9/16- chart reviewed, discussed with nursing staff, pt seen. Pt is still isolative, withdrawn, depressed, has poor ADLs. reports vague AH, refuses to elaborate ; med compliant, denies side effects. Next ECT tomorrow.   Follow-up note for September 17. ECT this morning without any complication. Patient continues to be vague about his symptoms. When I saw him this evening he had first told me he was feeling okay and then later said that he was still having hallucinations. Denies any current suicidal ideation but admits that he is still very nervous. At least he is getting up out of his room and taking care of his hygiene little better than previously.  Follow up 18th.Stable. No report of SI. Affect calm. Still anxious. Planning on discharge tomorrow   Per nursing: Patient is up on the unit more today. He is pleasant and cooperative. He does speak with some encouragement. He denies si, hi, avh. He denies pain. He smile and make eye contact. Patient did take a shower . Pt is sad and depressed. Pt states he  doesn't want to leave this facility, pt says he likes it here. Pt was having crying spells, pt was encouraged to express his feelings. Pt encouraged to write his feelings down. [name] SI, HI or A/V hallucinations. Pt depression level is 7/10. Pt contracted to safety. Pt had c/o of headache 7/10, pt given Tylenol 650 mg tab po. Pt reassessed, pt says he no longer has a headache. Pt blood glucose 159, no coverage required. Pt did receive Lantus 35 units to left upper arm, verified by Daria RN. Pt med complaint, no adverse affects noted. 15 min safety checks continues.  Principal Problem: Schizoaffective disorder, depressive type (HCC) Diagnosis:   Patient Active Problem List   Diagnosis Date Noted  . 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 Schizoaffective disorder, depressive type (HCC) [F25.1] 09/23/2016  . Polysubstance abuse [F19.10] 07/17/2016  . Tardive dyskinesia [G24.01] 07/16/2016  . Tobacco use disorder [F17.200] 07/07/2016  . Dyslipidemia [E78.5] 07/07/2016  . Asthma [J45.909] 07/07/2016  . HTN (hypertension) [I10] 07/06/2016  . Diabetes (HCC) [E11.9] 12/25/2010   Total Time spent with patient: 20 minutes  Past Psychiatric History: Patient has a long-standing history of mental illness but over the last year or so seems to of decompensated more than usual and has had one hospitalization after another. He did show some response to ECT and antipsychotic medication but seems to decompensate immediately whenever he is discharged.  Past Medical History:  Past Medical History:  Diagnosis Date  . Anxiety   . Asthma   . Diabetes mellitus   .  High blood pressure   . Sinus complaint    History reviewed. No pertinent surgical history. Family History: History reviewed. No pertinent family history. Family Psychiatric  History: Unknown Social History:  History  Alcohol Use No     History  Drug Use No    Social History   Social History  . Marital status: Single     Spouse name: [name]  . Number of children: [name]  . Years of education: [name]   Social History Main Topics  . Smoking status: Current Every Day Smoker    Packs/day: 0.50    Types: Cigarettes  . Smokeless tobacco: Never Used  . Alcohol use No  . Drug use: No  . Sexual activity: Not Currently   Other Topics Concern  . None   Social History Narrative  . None   Additional Social History:                         Sleep: Fair  Appetite:  Poor  Current Medications: Current Facility-Administered Medications  Medication Dose Route Frequency Provider Last Rate Last Dose  . acetaminophen (TYLENOL) tablet 650 mg  650 mg Oral Q6H PRN Barby Colvard, [name], MD   650 mg at 02/07/17 1927  . alum & mag hydroxide-simeth (MAALOX/MYLANTA) 200-200-20 MG/5ML suspension 30 mL  30 mL Oral Q4H PRN Tisheena Maguire, Azrielle Springsteen T, MD      . cloZAPine (CLOZARIL) tablet 150 mg  150 mg Oral QHS Spirit Wernli T, MD   150 mg at 02/08/17 2100  . feeding supplement (GLUCERNA SHAKE) (GLUCERNA SHAKE) liquid 237 mL  237 mL Oral TID BM Tadeo Besecker T, MD   237 mL at 02/09/17 1403  . FLUoxetine (PROZAC) capsule 40 mg  40 mg Oral Daily Ramsey Midgett T, MD   40 mg at 02/09/17 1610  . haloperidol lactate (HALDOL) injection 5 mg  5 mg Intravenous Once Alcee Sipos,  T, MD      . insulin aspart (novoLOG) injection 0-5 Units  0-5 Units Subcutaneous QHS Latosha Gaylord, [name], MD   3 Units at 02/08/17 2104  . insulin aspart (novoLOG) injection 0-9 Units  0-9 Units Subcutaneous TID WC Camry Theiss, [name], MD   5 Units at 02/09/17 1702  . insulin aspart (novoLOG) injection 5 Units  5 Units Subcutaneous TID WC Amanada Philbrick, [name], MD   5 Units at 02/09/17 1703  . insulin glargine (LANTUS) injection 40 Units  40 Units Subcutaneous QHS Isabelly Kobler T, MD      . lisinopril (PRINIVIL,ZESTRIL) tablet 20 mg  20 mg Oral Daily Kaleah Hagemeister, [name], MD   20 mg at 02/09/17 0742  . magnesium hydroxide (MILK OF MAGNESIA) suspension 30 mL  30 mL Oral Daily PRN ,   T, MD      . metFORMIN (GLUCOPHAGE) tablet 1,000 mg  1,000 mg Oral BID WC Tray Klayman T, MD   1,000 mg at 02/09/17 1703  . midazolam (VERSED) injection 2 mg  2 mg Intravenous Once Dovber Ernest T, MD      . midazolam (VERSED) injection 4 mg  4 mg Intravenous Once Tilmon Wisehart T, MD      . montelukast (SINGULAIR) tablet 10 mg  10 mg Oral QHS , [name], MD   10 mg at 02/08/17 2101  . nicotine (NICODERM CQ - dosed in mg/24 hours) patch 21 mg  21 mg Transdermal Daily [name], [name], MD   21 mg at 02/07/17 0833  . pantoprazole (  PROTONIX) EC tablet 40 mg  40 mg Oral Daily Jamarii Banks, [name], MD   40 mg at 02/09/17 0742  . propranolol (INDERAL) tablet 20 mg  20 mg Oral BID Rekita Miotke, [name], MD   20 mg at 02/09/17 1703    Lab Results:  Results for orders placed or performed during the hospital encounter of 01/19/17 (from the past 48 hour(s))  Glucose, capillary     Status: Abnormal   Collection Time: 02/08/17  6:57 AM  Result Value Ref Range   Glucose-Capillary 352 (H) 65 - 99 mg/dL  Glucose, capillary     Status: Abnormal   Collection Time: 02/08/17  1:22 PM  Result Value Ref Range   Glucose-Capillary 260 (H) 65 - 99 mg/dL  Glucose, capillary     Status: Abnormal   Collection Time: 02/08/17  4:27 PM  Result Value Ref Range   Glucose-Capillary 273 (H) 65 - 99 mg/dL   Comment 1 Notify RN   Glucose, capillary     Status: Abnormal   Collection Time: 02/08/17  8:36 PM  Result Value Ref Range   Glucose-Capillary 226 (H) 65 - 99 mg/dL  Glucose, capillary     Status: Abnormal   Collection Time: 02/09/17  6:59 AM  Result Value Ref Range   Glucose-Capillary 384 (H) 65 - 99 mg/dL  Glucose, capillary     Status: Abnormal   Collection Time: 02/09/17 11:39 AM  Result Value Ref Range   Glucose-Capillary 346 (H) 65 - 99 mg/dL   Comment 1 Notify RN    Comment 2 Document in Chart   Glucose, capillary     Status: Abnormal   Collection Time: 02/09/17  3:58 PM  Result Value Ref Range    Glucose-Capillary 288 (H) 65 - 99 mg/dL  Glucose, capillary     Status: Abnormal   Collection Time: 02/09/17  8:35 PM  Result Value Ref Range   Glucose-Capillary 236 (H) 65 - 99 mg/dL    Blood Alcohol level:  Lab Results  Component Value Date   ETH <5 01/16/2017   ETH <5 12/21/2016    Metabolic Disorder Labs: Lab Results  Component Value Date   HGBA1C 7.6 (H) 01/19/2017   MPG 171.42 01/19/2017   MPG 249 11/09/2016   Lab Results  Component Value Date   PROLACTIN 24.5 (H) 09/24/2016   PROLACTIN 3.4 (L) 07/07/2016   Lab Results  Component Value Date   CHOL 122 01/19/2017   TRIG 154 (H) 01/19/2017   HDL 36 (L) 01/19/2017   CHOLHDL 3.4 01/19/2017   VLDL 31 01/19/2017   LDLCALC 55 01/19/2017   LDLCALC 59 12/28/2016    Physical Findings: AIMS: Facial and Oral Movements Muscles of Facial Expression: None, normal Lips and Perioral Area: None, normal Jaw: None, normal Tongue: None, normal,Extremity Movements Upper (arms, wrists, hands, fingers): Mild Lower (legs, knees, ankles, toes): None, normal, Trunk Movements Neck, shoulders, hips: None, normal, Overall Severity Severity of abnormal movements (highest score from questions above): Minimal Incapacitation due to abnormal movements: None, normal Patient's awareness of abnormal movements (rate only patient's report): No Awareness, Dental Status Current problems with teeth and/or dentures?: No Does patient usually wear dentures?: No  CIWA:    COWS:  COWS Total Score: 1  Musculoskeletal: Strength & Muscle Tone: decreased Gait & Station: normal Patient leans: [name]  Psychiatric Specialty Exam: Physical Exam  Nursing note and vitals reviewed. Constitutional: He appears well-developed and well-nourished.  HENT:  Head: Normocephalic and atraumatic.  Eyes: Pupils  are equal, round, and reactive to light. Conjunctivae are normal.  Neck: Normal range of motion.  Cardiovascular: Regular rhythm and normal heart sounds.    Respiratory: Effort normal and breath sounds normal. No respiratory distress.  GI: Soft.  Musculoskeletal: Normal range of motion.  Neurological: He is alert.  Skin: Skin is warm and dry.  Psychiatric: His mood appears anxious. His affect is blunt. His speech is delayed. He is slowed and withdrawn. Thought content is paranoid. Cognition and memory are impaired. He expresses impulsivity. He expresses no homicidal and no suicidal ideation.    Review of Systems  Constitutional: Negative.   HENT: Negative.   Eyes: Negative.   Respiratory: Negative.   Cardiovascular: Negative.   Gastrointestinal: Negative.   Musculoskeletal: Negative.   Skin: Negative.   Neurological: Negative.   Psychiatric/Behavioral: Positive for depression, hallucinations, memory loss and suicidal ideas. Negative for substance abuse. The patient is nervous/anxious and has insomnia.     Blood pressure 105/82, pulse 86, temperature 97.7 F (36.5 C), resp. rate 18, height 6' (1.829 m), weight 77.1 kg (170 lb), SpO2 97 %.Body mass index is 23.06 kg/m.  General Appearance: Disheveled  Eye Contact:  Fair  Speech:  Slow  Volume:  Decreased  Mood:  Depressed and Dysphoric  Affect:  flat  Thought Process:  Disorganized  Orientation:  Full (Time, Place, and Person)  Thought Content:  Illogical, Hallucinations: Auditory and Paranoid Ideation, AH   Suicidal Thoughts:  no  Homicidal Thoughts:  No  Memory:  Immediate;   Good Recent;   Fair Remote;   Fair  Judgement:  Impaired  Insight:  Shallow  Psychomotor Activity:  Decreased  Concentration:  Concentration: Poor  Recall:  Poor  Fund of Knowledge:  Poor  Language:  Fair  Akathisia:  No  Handed:  Right  AIMS (if indicated):     Assets:  Desire for Improvement  ADL's:  Impaired  Cognition:  Impaired,  Mild  Sleep:  Number of Hours: 8.15   Mr. Shawnta has a history of schizoaffective disorder admitted for worsening of depression receiving ECT. Pt still psychotic,  unable to take care self.   #Schizoaffective disorder, SI - Continue ECT - Next one on 02/08/17 - Continue clozapine 150 mg QHS, aim to optimize dose of antipsychotic monotherapy. Level pending and will hold at 150 mg. Latest WBC-6.0, ANC-2.8.  - Prozac 40 mg.  HTN. He is on Lisinopril and Propranolol.   #Smoking cessation Continue nicotine patch 21 mg/24 hours  #DM Continue metformin 1000 mg BIDCC, Lantus 35 units at bedtime, Novolog 5 units before meals, ADA diet. Insulin sliding scale  #GERD Continue protonix EC 40 mg daily.   Disposition. TBE.   We are continuing ECT and medication changes including most recently an increase and change in his antidepressant. Treatment team aware of his multiple hospitalizations in the difficulty finding a safe place outside the hospital is considering referral to the state facility. Meanwhile work on improving current symptoms. Diabetes remains difficult to control. I did slightly increase his insulin yesterday and I don't want him to have a low blood sugar. Encouraged to stay on his regular diet without extra food in between meals. Strongly encouraged to be out of bed and attending therapy groups and interacting with peers and staff.  No changes to medication. ECT today. Spent time with him before and after treatment discussing the overall plan.  Gradually improving. Next ECT scheduled for Monday. No change to medicine for today. will continue to  try and engage him in supportive conversation and praise his attempts to get out of his room and work on coping skills.  He is still on the schedule for ECT Wednesday and Friday of this week although I'm hoping that possibly we may be getting to a point of looking at discharge. He still stays very nervous about it butI think in his current state he should be able to function if we could just get him to stop being so anxious about everything. No change to medication for today.  ECT tomorrow and then planned  discharge with outpt maintenance if possible. Increase lantus to 40unit for DM.  I certify that the services received since the previous certification/recertification were and continue to be medically necessary as the treatment provided can be reasonably expected to improve the patient's condition; the medical record documents that the services furnished were intensive treatment services or their equivalent services, and this patient continues to need, on a daily basis, active treatment furnished directly by or requiring the supervision of inpatient psychiatric personnel.  Mordecai Rasmussen, MD 02/09/2017, 9:11 PMPatient ID: [name], male   DOB: Jul 22, 1975, 41 y.o.   MRN: [id number] Patient ID: Algert Ryland, male   DOB: 07-03-1975, 41 y.o.   MRN: [id number]

## 2017-02-09 NOTE — [facility] Group Notes (Signed)
[facility] Group Notes:  (Nursing/MHT/Case Management/Adjunct)  Date:  02/09/2017  Time:  1:52 PM  Type of Therapy:  Psychoeducational Skills  Participation Level:  Did Not Attend  P  Tony Cunningham 02/09/2017, 1:52 PM

## 2017-02-09 NOTE — Progress Notes (Signed)
Patient ID: Myer Bohlman, male   DOB: 1975/11/29, 41 y.o.   MRN: [id number] LCSW Group Therapy Note 02/09/2017 9:00am  Type of Therapy and Topic:  Group Therapy:  Setting Goals  Participation Level:  Did Not Attend  Description of Group: In this process group, patients discussed using strengths to work toward goals and address challenges.  Patients identified two positive things about themselves and one goal they were working on.  Patients were given the opportunity to share openly and support each other's plan for self-empowerment.  The group discussed the value of gratitude and were encouraged to have a daily reflection of positive characteristics or circumstances.  Patients were encouraged to identify a plan to utilize their strengths to work on current challenges and goals.  Therapeutic Goals 1. Patient will verbalize personal strengths/positive qualities and relate how these can assist with achieving desired personal goals 2. Patients will verbalize affirmation of peers plans for personal change and goal setting 3. Patients will explore the value of gratitude and positive focus as related to successful achievement of goals 4. Patients will verbalize a plan for regular reinforcement of personal positive qualities and circumstances.  Summary of Patient Progress:       Therapeutic Modalities Cognitive Behavioral Therapy Motivational Interviewing    Glennon Mac, LCSW 02/09/2017 2:27 PM

## 2017-02-10 ENCOUNTER — Inpatient Hospital Stay: Payer: Medicare Other | Admitting: Anesthesiology

## 2017-02-10 ENCOUNTER — Inpatient Hospital Stay: Payer: Medicare Other

## 2017-02-10 LAB — GLUCOSE, CAPILLARY
GLUCOSE-CAPILLARY: 364 mg/dL — AB (ref 65–99)
GLUCOSE-CAPILLARY: 285 mg/dL — AB (ref 65–99)
Glucose-Capillary: 185 mg/dL — ABNORMAL HIGH (ref 65–99)
Glucose-Capillary: 220 mg/dL — ABNORMAL HIGH (ref 65–99)

## 2017-02-10 MED ORDER — METHOHEXITAL SODIUM 100 MG/10ML IV SOSY
PREFILLED_SYRINGE | INTRAVENOUS | Status: DC | PRN
  Administered 2017-02-10: 80 mg via INTRAVENOUS

## 2017-02-10 MED ORDER — INSULIN ASPART 100 UNIT/ML ~~LOC~~ SOLN: 5.0000 [IU] | Freq: Three times a day (TID) | SUBCUTANEOUS | 2 refills | Status: DC

## 2017-02-10 MED ORDER — SODIUM CHLORIDE 0.9 % IV SOLN
500.0000 mL | Freq: Once | INTRAVENOUS | Status: AC
  Administered 2017-02-10: 500 mL via INTRAVENOUS

## 2017-02-10 MED ORDER — PROPRANOLOL HCL 20 MG PO TABS: 20.0000 mg | ORAL_TABLET | Freq: Two times a day (BID) | ORAL | 1 refills | Status: DC

## 2017-02-10 MED ORDER — SODIUM CHLORIDE 0.9 % IV SOLN
INTRAVENOUS | Status: DC | PRN
  Administered 2017-02-10: 11:00:00 via INTRAVENOUS

## 2017-02-10 MED ORDER — SUCCINYLCHOLINE CHLORIDE 200 MG/10ML IV SOSY
PREFILLED_SYRINGE | INTRAVENOUS | Status: DC | PRN
  Administered 2017-02-10: 100 mg via INTRAVENOUS

## 2017-02-10 MED ORDER — SUCCINYLCHOLINE CHLORIDE 20 MG/ML IJ SOLN
INTRAMUSCULAR | Status: AC
  Filled 2017-02-10: qty 1

## 2017-02-10 MED ORDER — MIDAZOLAM HCL 2 MG/2ML IJ SOLN
INTRAMUSCULAR | Status: AC
  Filled 2017-02-10: qty 4

## 2017-02-10 MED ORDER — HALOPERIDOL LACTATE 5 MG/ML IJ SOLN
INTRAMUSCULAR | Status: AC
  Filled 2017-02-10: qty 1

## 2017-02-10 MED ORDER — MIDAZOLAM HCL 2 MG/2ML IJ SOLN
4.0000 mg | Freq: Once | INTRAMUSCULAR | Status: AC
  Administered 2017-02-10: 4 mg via INTRAVENOUS

## 2017-02-10 MED ORDER — PANTOPRAZOLE SODIUM 40 MG PO TBEC: 40.0000 mg | DELAYED_RELEASE_TABLET | Freq: Every day | ORAL | 1 refills | Status: DC

## 2017-02-10 MED ORDER — METFORMIN HCL 1000 MG PO TABS: 1000.0000 mg | ORAL_TABLET | Freq: Two times a day (BID) | ORAL | 1 refills | Status: DC

## 2017-02-10 MED ORDER — LISINOPRIL 20 MG PO TABS: 20.0000 mg | ORAL_TABLET | Freq: Every day | ORAL | 1 refills | Status: DC

## 2017-02-10 MED ORDER — CLOZAPINE 50 MG PO TABS: 150.0000 mg | ORAL_TABLET | Freq: Every day | ORAL | 1 refills | Status: DC

## 2017-02-10 MED ORDER — FLUOXETINE HCL 40 MG PO CAPS: 40.0000 mg | ORAL_CAPSULE | Freq: Every day | ORAL | 1 refills | Status: DC

## 2017-02-10 MED ORDER — INSULIN GLARGINE 100 UNIT/ML ~~LOC~~ SOLN: 40.0000 [IU] | Freq: Every day | SUBCUTANEOUS | 2 refills | Status: DC

## 2017-02-10 MED ORDER — MONTELUKAST SODIUM 10 MG PO TABS: 10.0000 mg | ORAL_TABLET | Freq: Every day | ORAL | 1 refills | Status: DC

## 2017-02-10 NOTE — Anesthesia Procedure Notes (Signed)
Date/Time: 02/10/2017 12:30 PM Performed by: Lily Kocher Pre-anesthesia Checklist: Patient identified, Emergency Drugs available, Suction available and Patient being monitored Patient Re-evaluated:Patient Re-evaluated prior to induction Oxygen Delivery Method: Circle system utilized Preoxygenation: Pre-oxygenation with 100% oxygen Induction Type: IV induction Ventilation: Mask ventilation without difficulty and Mask ventilation throughout procedure Airway Equipment and Method: Bite block Placement Confirmation: positive ETCO2 Dental Injury: Teeth and Oropharynx as per pre-operative assessment

## 2017-02-10 NOTE — Anesthesia Post-op Follow-up Note (Signed)
Anesthesia QCDR form completed.        

## 2017-02-10 NOTE — [facility] Suicide Risk Assessment (Signed)
[facility] Discharge Suicide Risk Assessment   Principal Problem: Schizoaffective disorder, depressive type [facility]) Discharge Diagnoses:  Patient Active Problem List   Diagnosis Date Noted  . 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 Schizoaffective disorder, depressive type (HCC) [F25.1] 09/23/2016  . Polysubstance abuse [F19.10] 07/17/2016  . Tardive dyskinesia [G24.01] 07/16/2016  . Tobacco use disorder [F17.200] 07/07/2016  . Dyslipidemia [E78.5] 07/07/2016  . Asthma [J45.909] 07/07/2016  . HTN (hypertension) [I10] 07/06/2016  . Diabetes (HCC) [E11.9] 12/25/2010    Total Time spent with patient: 30 minutes  Musculoskeletal: Strength & Muscle Tone: within normal limits Gait & Station: normal Patient leans: [name]  Psychiatric Specialty Exam: Review of Systems  Constitutional: Negative.   HENT: Negative.   Eyes: Negative.   Respiratory: Negative.   Cardiovascular: Negative.   Gastrointestinal: Negative.   Musculoskeletal: Negative.   Skin: Negative.   Neurological: Negative.   Psychiatric/Behavioral: Positive for depression. Negative for hallucinations, memory loss, substance abuse and suicidal ideas. The patient is not nervous/anxious and does not have insomnia.     Blood pressure 126/81, pulse 70, temperature 98.7 F (37.1 C), temperature source Oral, resp. rate 20, height 6' (1.829 m), weight 78 kg (172 lb), SpO2 97 %.Body mass index is 23.33 kg/m.  General Appearance: Casual  Eye Contact::  Fair  Speech:  Slow409  Volume:  Decreased  Mood:  Dysphoric  Affect:  Congruent and Flat  Thought Process:  Goal Directed  Orientation:  Full (Time, Place, and Person)  Thought Content:  Rumination  Suicidal Thoughts:  No  Homicidal Thoughts:  No  Memory:  Immediate;   Fair Recent;   Fair Remote;   Fair  Judgement:  Fair  Insight:  Fair  Psychomotor Activity:  Normal  Concentration:  Fair  Recall:  [employer] of Knowledge:Fair  Language: Fair  Akathisia:   No  Handed:  Right  AIMS (if indicated):     Assets:  Housing Social Support  Sleep:  Number of Hours: 7.45  Cognition: Impaired,  Mild  ADL's:  Impaired   Mental Status Per Nursing Assessment::   On Admission:     Demographic Factors:  Unemployed  Loss Factors: Financial problems/change in socioeconomic status  Historical Factors: Prior suicide attempts and Impulsivity  Risk Reduction Factors:   Living with another person, especially a relative, Positive social support and Positive therapeutic relationship  Continued Clinical Symptoms:  Depression:   Anhedonia Schizophrenia:   Depressive state  Cognitive Features That Contribute To Risk:  Closed-mindedness    Suicide Risk:  Mild:  Suicidal ideation of limited frequency, intensity, duration, and specificity.  There are no identifiable plans, no associated intent, mild dysphoria and related symptoms, good self-control (both objective and subjective assessment), few other risk factors, and identifiable protective factors, including available and accessible social support.  Follow-up Information    [facility] Follow up on 02/10/2017.   Why:  [name], your peer support specialist, will pick you up from the hospital and transport you back home. Contact information: [address] [place], [place] [address] [phone]       [facility] Follow up on 02/12/2017.   Why:  The [employer] [name] will pick you up Friday, 02/12/17, between 8am-8:30am to transport you to their facility for your intake appointment. Contact information: [address]  [place], [place] [address]          Phone:  [phone]. F: [phone]         [facility]. Go on 02/12/2017.   Why:  Please attend your intake appointment on Friday, 02/12/17, at 1:45 pm.  Please bring photo ID, insurance card, and a copy of your discharge paperwork. Contact information: [address] [place]  [address] [phone]           Plan Of Care/Follow-up recommendations:  Activity:  as tolerated Diet:  diabetic Other:  ect next Friday, 9/28  Mordecai Rasmussen, MD 02/10/2017, 3:11 PM

## 2017-02-10 NOTE — Procedures (Signed)
ECT SERVICES Physician's Interval Evaluation & Treatment Note  Patient Identification: Tony Cunningham MRN:  [id number] Date of Evaluation:  02/10/2017 TX #: [id number] MADRS:   MMSE:   P.E. Findings:  No change to physical exam  Psychiatric Interval Note:  Mood very sad and anxious still nervous about going home.  Subjective:  Patient is a 41 y.o. male seen for evaluation for Electroconvulsive Therapy. Patient is pleading to not be discharged today.  Treatment Summary:   []   Right Unilateral             []  Bilateral   % Energy : 1.0 ms 60%   Impedance: 1560 ohms  Seizure Energy Index: 20,592 V squared  Postictal Suppression Index: 81%  Seizure Concordance Index: 79%  Medications  Pre Shock: Brevital 80 mg succinylcholine 100 mg  Post Shock: Versed 4 mg Haldol 5 mg  Seizure Duration: 22 seconds by EMG 36 seconds by EEG   Comments: Depending on whether we can convince him to be discharged and will either be Friday which is what I'm going dependence N or in another week.   Lungs:  [x]   Clear to auscultation               []  Other:   Heart:    [x]   Regular rhythm             []  irregular rhythm    [x]   Previous H&P reviewed, patient examined and there are NO CHANGES                 []   Previous H&P reviewed, patient examined and there are changes noted.   , MD 9/19/201812:08 PM

## 2017-02-10 NOTE — Transfer of Care (Signed)
Immediate Anesthesia Transfer of Care Note  Patient: Tony Cunningham  Procedure(s) Performed: ECT  Patient Location: PACU  Anesthesia Type:General  Level of Consciousness: sedated  Airway & Oxygen Therapy: Patient Spontanous Breathing  Post-op Assessment: Report given to RN and Post -op Vital signs reviewed and stable  Post vital signs: Reviewed and stable  Last Vitals:  Vitals:   02/10/17 0635 02/10/17 0949  BP: 119/72 111/84  Pulse: 87 74  Resp: 16 18  Temp: 36.9 C (!) 36.2 C  SpO2: 100% 96%    Last Pain:  Vitals:   02/10/17 0949  TempSrc: Oral  PainSc:       Patients Stated Pain Goal: 0 (02/08/17 0120)  Complications: No apparent anesthesia complications

## 2017-02-10 NOTE — H&P (Signed)
Tony Cunningham is an 41 y.o. male.   Chief Complaint: Patient is anxious about going home. Not reporting suicidal ideation not depressed. HPI: Recurrent depressive episodes as part of schizoaffective disorder. Very withdrawn still but better than he was before.  Past Medical History:  Diagnosis Date  . Anxiety   . Asthma   . Diabetes mellitus   . High blood pressure   . Sinus complaint     History reviewed. No pertinent surgical history.  History reviewed. No pertinent family history. Social History:  reports that he has been smoking Cigarettes.  He has been smoking about 0.50 packs per day. He has never used smokeless tobacco. He reports that he does not drink alcohol or use drugs.  Allergies: No Known Allergies  Medications Prior to Admission  Medication Sig Dispense Refill  . amantadine (SYMMETREL) 100 MG capsule Take 1 capsule (100 mg total) by mouth 2 (two) times daily. For prevention of drug induced tremors 60 capsule 0  . benztropine (COGENTIN) 1 MG tablet Take 1 tablet (1 mg total) by mouth 2 (two) times daily. For prevention of drug induced tremors 60 tablet 0  . cholecalciferol 400 units tablet Take 1 tablet (400 Units total) by mouth daily. For bone health 30 each 0  . citalopram (CELEXA) 20 MG tablet Take 1 tablet (20 mg total) by mouth daily. For depression 30 tablet 0  . cloZAPine (CLOZARIL) 50 MG tablet Take 3 tablets (150 mg total) by mouth at bedtime. For mood control 90 tablet 0  . cyclobenzaprine (FLEXERIL) 5 MG tablet Take 1 tablet (5 mg total) by mouth 3 (three) times daily as needed for muscle spasms. 15 tablet 0  . diclofenac sodium (VOLTAREN) 1 % GEL Apply 2 g topically 4 (four) times daily. For arthritis pain    . insulin aspart (NOVOLOG) 100 UNIT/ML injection Inject 6 Units into the skin 3 (three) times daily with meals. 10 mL 11  . insulin glargine (LANTUS) 100 UNIT/ML injection Inject 0.45 mLs (45 Units total) into the skin at bedtime. For diabetic management 10  mL 0  . insulin glargine (LANTUS) 100 UNIT/ML injection Inject 0.4 mLs (40 Units total) into the skin at bedtime. 10 mL 11  . lisinopril (PRINIVIL,ZESTRIL) 20 MG tablet Take 1 tablet (20 mg total) by mouth daily. For high blood pressure 30 tablet 0  . metFORMIN (GLUCOPHAGE) 1000 MG tablet Take 1 tablet (1,000 mg total) by mouth 2 (two) times daily with a meal. For diabetes management 60 tablet 0  . montelukast (SINGULAIR) 10 MG tablet Take 1 tablet (10 mg total) by mouth at bedtime. For shortness of breath 30 tablet 0  . nicotine polacrilex (NICORETTE) 2 MG gum Take 1 each (2 mg total) by mouth as needed for smoking cessation. 100 tablet 0  . pantoprazole (PROTONIX) 40 MG tablet Take 1 tablet (40 mg total) by mouth daily. For acid reflux 30 tablet 0  . polyethylene glycol (MIRALAX / GLYCOLAX) packet Take 17 g by mouth daily. For constipation 14 each 0  . propranolol (INDERAL) 20 MG tablet Take 1 tablet (20 mg total) by mouth 2 (two) times daily. For high blood pressure 60 tablet 0  . simvastatin (ZOCOR) 20 MG tablet Take 1 tablet (20 mg total) by mouth daily at 6 PM. For high Cholesterol 30 tablet 0  . temazepam (RESTORIL) 7.5 MG capsule Take 1 capsule (7.5 mg total) by mouth at bedtime. For sleep 14 capsule 0    Results for orders placed  or performed during the hospital encounter of 01/19/17 (from the past 48 hour(s))  Glucose, capillary     Status: Abnormal   Collection Time: 02/08/17  1:22 PM  Result Value Ref Range   Glucose-Capillary 260 (H) 65 - 99 mg/dL  Glucose, capillary     Status: Abnormal   Collection Time: 02/08/17  4:27 PM  Result Value Ref Range   Glucose-Capillary 273 (H) 65 - 99 mg/dL   Comment 1 Notify RN   Glucose, capillary     Status: Abnormal   Collection Time: 02/08/17  8:36 PM  Result Value Ref Range   Glucose-Capillary 226 (H) 65 - 99 mg/dL  Glucose, capillary     Status: Abnormal   Collection Time: 02/09/17  6:59 AM  Result Value Ref Range   Glucose-Capillary  384 (H) 65 - 99 mg/dL  Glucose, capillary     Status: Abnormal   Collection Time: 02/09/17 11:39 AM  Result Value Ref Range   Glucose-Capillary 346 (H) 65 - 99 mg/dL   Comment 1 Notify RN    Comment 2 Document in Chart   Glucose, capillary     Status: Abnormal   Collection Time: 02/09/17  3:58 PM  Result Value Ref Range   Glucose-Capillary 288 (H) 65 - 99 mg/dL  Glucose, capillary     Status: Abnormal   Collection Time: 02/09/17  8:35 PM  Result Value Ref Range   Glucose-Capillary 236 (H) 65 - 99 mg/dL  Glucose, capillary     Status: Abnormal   Collection Time: 02/10/17  6:41 AM  Result Value Ref Range   Glucose-Capillary 364 (H) 65 - 99 mg/dL  Glucose, capillary     Status: Abnormal   Collection Time: 02/10/17  8:57 AM  Result Value Ref Range   Glucose-Capillary 285 (H) 65 - 99 mg/dL   No results found.  Review of Systems  Constitutional: Negative.   HENT: Negative.   Eyes: Negative.   Respiratory: Negative.   Cardiovascular: Negative.   Gastrointestinal: Negative.   Musculoskeletal: Negative.   Skin: Negative.   Neurological: Negative.   Psychiatric/Behavioral: Positive for memory loss. Negative for depression, hallucinations, substance abuse and suicidal ideas. The patient is nervous/anxious. The patient does not have insomnia.     Blood pressure 111/84, pulse 74, temperature (!) 97.2 F (36.2 C), temperature source Oral, resp. rate 18, height 6' (1.829 m), weight 78 kg (172 lb), SpO2 96 %. Physical Exam  Nursing note and vitals reviewed. Constitutional: He appears well-developed and well-nourished.  HENT:  Head: Normocephalic and atraumatic.  Eyes: Pupils are equal, round, and reactive to light. Conjunctivae are normal.  Neck: Normal range of motion.  Cardiovascular: Regular rhythm and normal heart sounds.   Respiratory: Effort normal. No respiratory distress.  GI: Soft.  Musculoskeletal: Normal range of motion.  Neurological: He is alert.  Skin: Skin is warm  and dry.  Psychiatric: His mood appears anxious. His affect is blunt. His speech is delayed. He is slowed and withdrawn. Thought content is not paranoid. He expresses impulsivity. He expresses no homicidal and no suicidal ideation.     Assessment/Plan We are coaching the patient on our recommended discharge plan for today and will put him down to try and have him come back in a week for maintenance treatment of possible  Mordecai Rasmussen, MD 02/10/2017, 12:07 PM

## 2017-02-10 NOTE — Progress Notes (Signed)
D: Patient is aware of  Discharge this shift .Patient denies suicidal /homicidal ideations. Patient received all belongings  brought in  A: No Storage medications. Writer reviewed Discharge Summary, Suicide Risk Assessment, and Transitional Record. Patient also received Prescriptions   from  MD.. Aware  Of follow up appointment . R: Patient left unit with no questions  Or concerns  With Peer Support worker .

## 2017-02-10 NOTE — Anesthesia Postprocedure Evaluation (Signed)
Anesthesia Post Note  Patient: Tony Cunningham  Procedure(s) Performed: * No procedures listed *  Patient location during evaluation: PACU Anesthesia Type: General Level of consciousness: awake and alert and oriented Pain management: pain level controlled Vital Signs Assessment: post-procedure vital signs reviewed and stable Respiratory status: spontaneous breathing Cardiovascular status: blood pressure returned to baseline Anesthetic complications: no     Last Vitals:  Vitals:   02/10/17 0635 02/10/17 0949  BP: 119/72 111/84  Pulse: 87 74  Resp: 16 18  Temp: 36.9 C (!) 36.2 C  SpO2: 100% 96%    Last Pain:  Vitals:   02/10/17 0949  TempSrc: Oral  PainSc:                  Yalanda Soderman

## 2017-02-10 NOTE — [facility] Group Notes (Signed)
[facility] LCSW Group Therapy Note  Date/Time: 02/09/17, 1500  Type of Therapy/Topic:  Group Therapy:  Feelings about Diagnosis  Participation Level:  Active   Mood: anxious   Description of Group:    This group will allow patients to explore their thoughts and feelings about diagnoses they have received. Patients will be guided to explore their level of understanding and acceptance of these diagnoses. Facilitator will encourage patients to process their thoughts and feelings about the reactions of others to their diagnosis, and will guide patients in identifying ways to discuss their diagnosis with significant others in their lives. This group will be process-oriented, with patients participating in exploration of their own experiences as well as giving and receiving support and challenge from other group members.   Therapeutic Goals: 1. Patient will demonstrate understanding of diagnosis as evidence by identifying two or more symptoms of the disorder:  2. Patient will be able to express two feelings regarding the diagnosis 3. Patient will demonstrate ability to communicate their needs through discussion and/or role plays  Summary of Patient Progress: Pt came to group and was anxious about pending discharge for tomorrow.  This issue was processed with pt who continues to report he is "not ready."  CSW and pt discussed the plan that has been put in place for supports once he is released from the hospital.        Therapeutic Modalities:   Cognitive Behavioral Therapy Brief Therapy Feelings Identification   Daleen Squibb, LCSW

## 2017-02-10 NOTE — Plan of Care (Signed)
[facility] Crisis Plan  Reason for Crisis Plan:  Chronic Mental Illness/Medical Illness   Plan of Care:  Referral for Inpatient Hospitalization  Family Support:    sister Jocelyn Goettl [phone]; mother   Current Living Environment:  Discharged to live w mother Eulalia Lugo (Spanish speaker, will need interpreter) [phone]  [phone]     Insurance:   Hospital Account    Name Acct ID Class Status Primary Coverage   Moroni, Cecilia [id number] [facility] Inpatient Special Open MEDICARE - MEDICARE PART A AND B        Guarantor Account (for Hospital Account #[id number])    Name Relation to Pt Service Area Active? Acct Type   Rohrman, Macedonio Self [facility] Yes Behavioral Health   Address Phone       [address] [place], [place] [address] [phone](H)          Coverage Information (for Hospital Account #[id number])    1. MEDICARE/MEDICARE PART A AND B    F/O Payor/Plan Precert #   MEDICARE/MEDICARE PART A AND B    Subscriber Subscriber #   Savich, Clif [id number]   Address Phone   [address] [place], [place] [address]        2. [place]    F/[place] Payor/Plan Precert #   [place]    Subscriber Subscriber #   Ontko, Greogory [id number]   Address Phone   [address] [place],  [address] [phone]          Legal Guardian:   self  Primary Care Provider:  Patient, No Pcp Per  Current Outpatient Providers:  [facility] peer support provider ([name] Rex - [phone]); medications management [facility]; [facility] care coordinator Sandra Reiman ([phone])  Psychiatrist:   [facility]  Counselor/Therapist:   peer support, see above  Compliant with Medications:  Yes  Additional Information:  Patient has chronic suicidality, frequent readmissions, multiple group home placements; treatment team and patient have met to discuss responding to crises by reliance on community supports.  Patient  discharged to live w mother, attend [facility], peer support, medications from [facility].  Pls contact patients peer support, care coordinator and/or PSR provider first before considering admission to inpatient.   Anne C Cunningham 9/19/20183:43 PM 

## 2017-02-10 NOTE — Discharge Summary (Signed)
Physician Discharge Summary Note  Patient:  Tony Cunningham is an 41 y.o., male MRN:  [id number] DOB:  Feb 10, 1976 Patient phone:  [phone] (home)  Patient address:   [address] [place] [address],  Total Time spent with patient: 45 minutes  Date of Admission:  01/19/2017 Date of Discharge: 02/10/2017  Reason for Admission:  41 year old man with a history of schizoaffective disorder was admitted to the hospital through the emergency room reporting suicidal ideation and psychotic symptoms  Principal Problem: Schizoaffective disorder, depressive type [facility]) Discharge Diagnoses: Patient Active Problem List   Diagnosis Date Noted  . 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 Schizoaffective disorder, depressive type (HCC) [F25.1] 09/23/2016  . Polysubstance abuse [F19.10] 07/17/2016  . Tardive dyskinesia [G24.01] 07/16/2016  . Tobacco use disorder [F17.200] 07/07/2016  . Dyslipidemia [E78.5] 07/07/2016  . Asthma [J45.909] 07/07/2016  . HTN (hypertension) [I10] 07/06/2016  . Diabetes (HCC) [E11.9] 12/25/2010    Past Psychiatric History: Patient has a psychiatric history of probable schizoaffective disorder. He has had multiple hospitalizations back-to-back especially this year. In previous years he seems to of been doing better and have been functional although for many months now he has been reporting frequent suicidal ideation hallucinations and has had poor response to treatment. Patient has been treated with ECT previously at our facility with only moderate response. He has a history of some compliance difficulty outside the hospital. Does have a history of suicide attempts no known history of violence.  Past Medical History:  Past Medical History:  Diagnosis Date  . Anxiety   . Asthma   . Diabetes mellitus   . High blood pressure   . Sinus complaint    History reviewed. No pertinent surgical history. Family History: History reviewed. No pertinent  family history. Family Psychiatric  History: Does not know of any Social History:  History  Alcohol Use No     History  Drug Use No    Social History   Social History  . Marital status: Single    Spouse name: [name]  . Number of children: [name]  . Years of education: [name]   Social History Main Topics  . Smoking status: Current Every Day Smoker    Packs/day: 0.50    Types: Cigarettes  . Smokeless tobacco: Never Used  . Alcohol use No  . Drug use: No  . Sexual activity: Not Currently   Other Topics Concern  . None   Social History Narrative  . None    Hospital Course:  Patient was admitted to the psychiatric ward. Clozapine was continued as well as other psychiatric medicine. Patient was agreeable to restarting ECT which had previously seemed to be helpful to him. He has received 7 bilateral ECT treatments at the time of discharge. Treatments of been tolerated well and he seems to have shown significant improvement. Patient has not acted out to harm himself while in the hospital. He has been cooperative with treatment. For long stretches he would stay isolated to himself with poor hygiene but in the last week has begun to come out of the room interacting more. He now denies any suicidal thoughts. Hallucinations appear to be minimal. Patient has diabetes with labile blood sugars but gradually we have improved control with insulin and consistent diet. Patient was somewhat hesitant about discharge planning but we have managed to have his family agreed to discharge to have him stay with his mother in [place]. Patient will continue current medication and we have  made a request of the family did try to have him come back for maintenance ECT on the 28th if it is possible.  Physical Findings: AIMS: Facial and Oral Movements Muscles of Facial Expression: None, normal Lips and Perioral Area: None, normal Jaw: None, normal Tongue: None, normal,Extremity Movements Upper (arms, wrists, hands,  fingers): Mild Lower (legs, knees, ankles, toes): None, normal, Trunk Movements Neck, shoulders, hips: None, normal, Overall Severity Severity of abnormal movements (highest score from questions above): Minimal Incapacitation due to abnormal movements: None, normal Patient's awareness of abnormal movements (rate only patient's report): No Awareness, Dental Status Current problems with teeth and/or dentures?: No Does patient usually wear dentures?: No  CIWA:    COWS:  COWS Total Score: 1  Musculoskeletal: Strength & Muscle Tone: within normal limits Gait & Station: normal Patient leans: [name]  Psychiatric Specialty Exam: Physical Exam  Nursing note and vitals reviewed. Constitutional: He appears well-developed and well-nourished.  HENT:  Head: Normocephalic and atraumatic.  Eyes: Pupils are equal, round, and reactive to light. Conjunctivae are normal.  Neck: Normal range of motion.  Cardiovascular: Regular rhythm and normal heart sounds.   Respiratory: Effort normal and breath sounds normal. No respiratory distress.  GI: Soft.  Musculoskeletal: Normal range of motion.  Neurological: He is alert.  Skin: Skin is warm and dry.  Psychiatric: Judgment normal. His affect is blunt. His speech is delayed. He is slowed. Thought content is not paranoid. Cognition and memory are impaired. He expresses no homicidal and no suicidal ideation.    Review of Systems  Constitutional: Negative.   HENT: Negative.   Eyes: Negative.   Respiratory: Negative.   Cardiovascular: Negative.   Gastrointestinal: Negative.   Musculoskeletal: Negative.   Skin: Negative.   Neurological: Negative.   Psychiatric/Behavioral: Positive for memory loss. Negative for depression, hallucinations, substance abuse and suicidal ideas. The patient is nervous/anxious. The patient does not have insomnia.     Blood pressure 126/81, pulse 70, temperature 98.7 F (37.1 C), temperature source Oral, resp. rate 20, height 6'  (1.829 m), weight 78 kg (172 lb), SpO2 97 %.Body mass index is 23.33 kg/m.  General Appearance: Casual  Eye Contact:  Fair  Speech:  Slow  Volume:  Decreased  Mood:  Dysphoric  Affect:  Constricted  Thought Process:  Goal Directed  Orientation:  Full (Time, Place, and Person)  Thought Content:  Rumination  Suicidal Thoughts:  No  Homicidal Thoughts:  No  Memory:  Immediate;   Fair Recent;   Fair Remote;   Fair  Judgement:  Fair  Insight:  Fair  Psychomotor Activity:  Decreased  Concentration:  Concentration: Fair  Recall:  [employer] of Knowledge:  Fair  Language:  Fair  Akathisia:  No  Handed:  Right  AIMS (if indicated):     Assets:  Desire for Improvement Financial Resources/Insurance Housing Resilience Social Support  ADL's:  Impaired  Cognition:  Impaired,  Mild  Sleep:  Number of Hours: 7.45     Have you used any form of tobacco in the last 30 days? (Cigarettes, Smokeless Tobacco, Cigars, and/or Pipes): Yes  Has this patient used any form of tobacco in the last 30 days? (Cigarettes, Smokeless Tobacco, Cigars, and/or Pipes) Yes, Yes, Prescription not provided because: Patient has stated a desire to continue smoking  Blood Alcohol level:  Lab Results  Component Value Date   [facility] <5 01/16/2017   ETH <5 12/21/2016    Metabolic Disorder Labs:  Lab Results  Component  Value Date   HGBA1C 7.6 (H) 01/19/2017   MPG 171.42 01/19/2017   MPG 249 11/09/2016   Lab Results  Component Value Date   PROLACTIN 24.5 (H) 09/24/2016   PROLACTIN 3.4 (L) 07/07/2016   Lab Results  Component Value Date   CHOL 122 01/19/2017   TRIG 154 (H) 01/19/2017   HDL 36 (L) 01/19/2017   CHOLHDL 3.4 01/19/2017   VLDL 31 01/19/2017   LDLCALC 55 01/19/2017   LDLCALC 59 12/28/2016    See Psychiatric Specialty Exam and Suicide Risk Assessment completed by Attending Physician prior to discharge.  Discharge destination:  Home  Is patient on multiple antipsychotic therapies at  discharge:  No   Has Patient had three or more failed trials of antipsychotic monotherapy by history:  No  Recommended Plan for Multiple Antipsychotic Therapies: NA  Discharge Instructions    Diet - low sodium heart healthy    Complete by:  As directed    Increase activity slowly    Complete by:  As directed      Allergies as of 02/10/2017   No Known Allergies     Medication List    STOP taking these medications   amantadine 100 MG capsule Commonly known as:  SYMMETREL   benztropine 1 MG tablet Commonly known as:  COGENTIN   citalopram 20 MG tablet Commonly known as:  CELEXA   cyclobenzaprine 5 MG tablet Commonly known as:  FLEXERIL   diclofenac sodium 1 % Gel Commonly known as:  VOLTAREN   nicotine polacrilex 2 MG gum Commonly known as:  NICORETTE   polyethylene glycol packet Commonly known as:  MIRALAX / GLYCOLAX   simvastatin 20 MG tablet Commonly known as:  ZOCOR   temazepam 7.5 MG capsule Commonly known as:  RESTORIL   Vitamin D3 400 units tablet     TAKE these medications     Indication  clozapine 50 MG tablet Commonly known as:  CLOZARIL Take 3 tablets (150 mg total) by mouth at bedtime. What changed:  additional instructions  Indication:  Schizoaffective Disorder   FLUoxetine 40 MG capsule Commonly known as:  PROZAC Take 1 capsule (40 mg total) by mouth daily.  Indication:  Major Depressive Disorder   insulin aspart 100 UNIT/ML injection Commonly known as:  novoLOG Inject 5 Units into the skin 3 (three) times daily with meals. What changed:  how much to take  Indication:  Type 2 Diabetes   insulin glargine 100 UNIT/ML injection Commonly known as:  LANTUS Inject 0.4 mLs (40 Units total) into the skin at bedtime. What changed:  Another medication with the same name was removed. Continue taking this medication, and follow the directions you see here.  Indication:  Type 2 Diabetes   lisinopril 20 MG tablet Commonly known as:   PRINIVIL,ZESTRIL Take 1 tablet (20 mg total) by mouth daily. What changed:  additional instructions  Indication:  High Blood Pressure Disorder   metFORMIN 1000 MG tablet Commonly known as:  GLUCOPHAGE Take 1 tablet (1,000 mg total) by mouth 2 (two) times daily with a meal. What changed:  additional instructions  Indication:  Type 2 Diabetes   montelukast 10 MG tablet Commonly known as:  SINGULAIR Take 1 tablet (10 mg total) by mouth at bedtime. What changed:  additional instructions  Indication:  Asthma   pantoprazole 40 MG tablet Commonly known as:  PROTONIX Take 1 tablet (40 mg total) by mouth daily. What changed:  additional instructions  Indication:  Gastroesophageal Reflux Disease  propranolol 20 MG tablet Commonly known as:  INDERAL Take 1 tablet (20 mg total) by mouth 2 (two) times daily. What changed:  additional instructions  Indication:  High Blood Pressure Disorder      Follow-up Information    [facility] Follow up on 02/10/2017.   Why:  [name], your peer support specialist, will pick you up from the hospital and transport you back home. Contact information: [address] [place], [place] [address] [phone]       [facility] Follow up on 02/12/2017.   Why:  [facility] [name] will pick you up Friday, 02/12/17, between 8am-8:30am to transport you to their facility for your intake appointment. Contact information: [name]  [place], [place] [address]          Phone:  [phone]. F: [phone]         [facility]. Go on 02/12/2017.   Why:  Please attend your intake appointment on Friday, 02/12/17, at 1:45 pm.  Please bring photo ID, insurance card, and a copy of your discharge paperwork. Contact information: [address] [place] [address] [phone]           Follow-up recommendations:  Activity:  Activity as tolerated Diet:  Diabetic diet Other:  Outpatient treatment  in the community and follow-up with maintenance ECT if it is at all possible beginning next Friday the 28th  Comments:  Patient counseled about working on skills to handle his distress. Medications provided. Pierce support in place. Treatment team agreeable to plan for discharge.  Signed: Mordecai Rasmussen, MD 02/10/2017, 5:18 PM

## 2017-02-10 NOTE — Progress Notes (Signed)
  [facility] Adult Case Management Discharge Plan :  Will you be returning to the same living situation after discharge:  No. Home with mother. At discharge, do you have transportation home?: Yes,  peer support worker Do you have the ability to pay for your medications: Yes,  medicaid/medicare  Release of information consent forms completed and in the chart;  Patient's signature needed at discharge.  Patient to Follow up at: Follow-up Information    [facility] Follow up on 02/10/2017.   Why:  Tony Cunningham, your peer support specialist, will pick you up from the hospital and transport you back home. Contact information: [address] [place], [place] [address] [phone]       [facility] Follow up on 02/12/2017.   Why:  [facility] Tony Cunningham will pick you up Friday, 02/12/17, between 8am-8:30am to transport you to their facility for your intake appointment. Contact information: [address]  [place], [place] [address]          Phone:  [phone]. F: [phone]         Tony Cunningham, Fre[facility]. Go on 02/12/2017.   Why:  Please attend your intake appointment on Friday, 02/12/17, at 1:45 pm.  Please bring photo ID, insurance card, and a copy of your discharge paperwork. Contact information: [address] Tony Cunningham [place] [address] [phone]           Next level of care provider has access to [facility] Link:no  Safety Planning and Suicide Prevention discussed: Yes,  with sister  Have you used any form of tobacco in the last 30 days? (Cigarettes, Smokeless Tobacco, Cigars, and/or Pipes): Yes  Has patient been referred to the Quitline?: Patient refused referral  Patient has been referred for addiction treatment: Yes  Tony Frederick, LCSW 02/10/2017, 3:09 PM

## 2017-02-10 NOTE — Anesthesia Preprocedure Evaluation (Signed)
Anesthesia Evaluation  Patient identified by MRN, date of birth, ID band Patient awake    Reviewed: Allergy & Precautions, NPO status , Patient's Chart, lab work & pertinent test results  History of Anesthesia Complications Negative for: history of anesthetic complications  Airway Mallampati: II  TM Distance: >3 FB Neck ROM: Full    Dental  (+) Poor Dentition, Chipped   Pulmonary asthma , Current Smoker,    breath sounds clear to auscultation- rhonchi (-) wheezing      Cardiovascular hypertension, Pt. on medications (-) CAD, (-) Past MI and (-) Cardiac Stents  Rhythm:Regular Rate:Normal - Systolic murmurs and - Diastolic murmurs    Neuro/Psych PSYCHIATRIC DISORDERS Anxiety Depression Schizophrenia negative neurological ROS     GI/Hepatic negative GI ROS, Neg liver ROS,   Endo/Other  diabetes, Insulin Dependent  Renal/GU negative Renal ROS     Musculoskeletal negative musculoskeletal ROS (+)   Abdominal (+) - obese,   Peds  Hematology negative hematology ROS (+)   Anesthesia Other Findings   Reproductive/Obstetrics                             Anesthesia Physical  Anesthesia Plan  ASA: III  Anesthesia Plan: General   Post-op Pain Management:    Induction: Intravenous  PONV Risk Score and Plan:   Airway Management Planned: Mask  Additional Equipment:   Intra-op Plan:   Post-operative Plan:   Informed Consent: I have reviewed the patients History and Physical, chart, labs and discussed the procedure including the risks, benefits and alternatives for the proposed anesthesia with the patient or authorized representative who has indicated his/her understanding and acceptance.   Dental advisory given  Plan Discussed with: CRNA and Anesthesiologist  Anesthesia Plan Comments: (Patient consented for risks of anesthesia including but not limited to:  - adverse reactions to  medications - damage to teeth, lips or other oral mucosa - sore throat or hoarseness - Damage to heart, brain, lungs or loss of life  Patient voiced understanding.)        Anesthesia Quick Evaluation  

## 2017-02-10 NOTE — [facility] Group Notes (Signed)
[facility] Group Notes:  (Nursing/MHT/Case Management/Adjunct)  Date:  02/10/2017  Time:  1:53 AM  Type of Therapy:  Psychoeducational Skills  Participation Level:  Active  Participation Quality:  Appropriate, Attentive and Sharing  Affect:  Appropriate  Cognitive:  Appropriate  Insight:  Appropriate and Good  Engagement in Group:  Engaged  Modes of Intervention:  Discussion, Socialization and Support  Summary of Progress/Problems:  Tony Cunningham 02/10/2017, 1:53 AM

## 2017-02-10 NOTE — Tx Team (Signed)
Interdisciplinary Treatment and Diagnostic Plan Update  02/10/2017 Time of Session: 10:30am Tony Cunningham MRN: [id number]  Principal Diagnosis: Schizoaffective disorder, depressive type (HCC)  Secondary Diagnoses: Principal Problem:   Schizoaffective disorder, depressive type (HCC) Active Problems:   Diabetes (HCC)   HTN (hypertension)   Tobacco use disorder   Dyslipidemia   Tardive dyskinesia   Current Medications:  Current Facility-Administered Medications  Medication Dose Route Frequency Provider Last Rate Last Dose  . acetaminophen (TYLENOL) tablet 650 mg  650 mg Oral Q6H PRN [name], [name], MD   650 mg at 02/07/17 1927  . alum & mag hydroxide-simeth (MAALOX/MYLANTA) 200-200-20 MG/5ML suspension 30 mL  30 mL Oral Q4H PRN [name], [name], MD      . cloZAPine (CLOZARIL) tablet 150 mg  150 mg Oral QHS [name], [name], MD   150 mg at 02/09/17 2117  . feeding supplement (GLUCERNA SHAKE) (GLUCERNA SHAKE) liquid 237 mL  237 mL Oral TID BM [name], [name], MD   237 mL at 02/09/17 2117  . FLUoxetine (PROZAC) capsule 40 mg  40 mg Oral Daily [name], [name], MD   40 mg at 02/09/17 0742  . insulin aspart (novoLOG) injection 0-5 Units  0-5 Units Subcutaneous QHS [name], [name], MD   2 Units at 02/09/17 2121  . insulin aspart (novoLOG) injection 0-9 Units  0-9 Units Subcutaneous TID WC [name], [name], MD   5 Units at 02/09/17 1702  . insulin aspart (novoLOG) injection 5 Units  5 Units Subcutaneous TID WC [name], [name], MD   5 Units at 02/10/17 [phone]  . insulin glargine (LANTUS) injection 40 Units  40 Units Subcutaneous QHS [name], [name], MD   40 Units at 02/09/17 2142  . lisinopril (PRINIVIL,ZESTRIL) tablet 20 mg  20 mg Oral Daily [name], [name], MD   20 mg at 02/10/17 0740  . magnesium hydroxide (MILK OF MAGNESIA) suspension 30 mL  30 mL Oral Daily PRN [name], [name], MD      . metFORMIN (GLUCOPHAGE) tablet 1,000 mg  1,000 mg Oral BID WC [name], [name], MD   1,000 mg at 02/09/17 1703  .  midazolam (VERSED) injection 2 mg  2 mg Intravenous Once [name], [name], MD      . midazolam (VERSED) injection 4 mg  4 mg Intravenous Once [name], [name], MD      . montelukast (SINGULAIR) tablet 10 mg  10 mg Oral QHS [name], [name], MD   10 mg at 02/09/17 2117  . nicotine (NICODERM CQ - dosed in mg/24 hours) patch 21 mg  21 mg Transdermal Daily [name], [name], MD   21 mg at 02/07/17 0833  . pantoprazole (PROTONIX) EC tablet 40 mg  40 mg Oral Daily [name], [name], MD   40 mg at 02/10/17 0742  . propranolol (INDERAL) tablet 20 mg  20 mg Oral BID [name], [name], MD   20 mg at 02/10/17 7858   Facility-Administered Medications Ordered in Other Encounters  Medication Dose Route Frequency Provider Last Rate Last Dose  . 0.9 %  sodium chloride infusion    Continuous PRN Lily Kocher, CRNA      . methohexital Sodium   Intravenous Anesthesia Intra-op Lily Kocher, CRNA   80 mg at 02/10/17 1226  . succinylcholine (ANECTINE) syringe   Intravenous Anesthesia Intra-op Lily Kocher, CRNA   100 mg at 02/10/17 1228   PTA Medications: Prescriptions Prior to Admission  Medication Sig Dispense Refill Last  Dose  . amantadine (SYMMETREL) 100 MG capsule Take 1 capsule (100 mg total) by mouth 2 (two) times daily. For prevention of drug induced tremors 60 capsule 0 Unknown at Unknown  . benztropine (COGENTIN) 1 MG tablet Take 1 tablet (1 mg total) by mouth 2 (two) times daily. For prevention of drug induced tremors 60 tablet 0 Unknown at Unknown  . cholecalciferol 400 units tablet Take 1 tablet (400 Units total) by mouth daily. For bone health 30 each 0 Unknown at Unknown  . citalopram (CELEXA) 20 MG tablet Take 1 tablet (20 mg total) by mouth daily. For depression 30 tablet 0 Unknown at Unknown  . cloZAPine (CLOZARIL) 50 MG tablet Take 3 tablets (150 mg total) by mouth at bedtime. For mood control 90 tablet 0 Unknown at Unknown  . cyclobenzaprine (FLEXERIL) 5 MG tablet Take 1 tablet (5 mg total)  by mouth 3 (three) times daily as needed for muscle spasms. 15 tablet 0 Unknown at Unknown  . diclofenac sodium (VOLTAREN) 1 % GEL Apply 2 g topically 4 (four) times daily. For arthritis pain   Unknown at Unknown  . insulin aspart (NOVOLOG) 100 UNIT/ML injection Inject 6 Units into the skin 3 (three) times daily with meals. 10 mL 11 Unknown at Unknown  . insulin glargine (LANTUS) 100 UNIT/ML injection Inject 0.45 mLs (45 Units total) into the skin at bedtime. For diabetic management 10 mL 0   . insulin glargine (LANTUS) 100 UNIT/ML injection Inject 0.4 mLs (40 Units total) into the skin at bedtime. 10 mL 11   . lisinopril (PRINIVIL,ZESTRIL) 20 MG tablet Take 1 tablet (20 mg total) by mouth daily. For high blood pressure 30 tablet 0 Unknown at Unknown  . metFORMIN (GLUCOPHAGE) 1000 MG tablet Take 1 tablet (1,000 mg total) by mouth 2 (two) times daily with a meal. For diabetes management 60 tablet 0 Unknown at Unknown  . montelukast (SINGULAIR) 10 MG tablet Take 1 tablet (10 mg total) by mouth at bedtime. For shortness of breath 30 tablet 0 Unknown at Unknown  . nicotine polacrilex (NICORETTE) 2 MG gum Take 1 each (2 mg total) by mouth as needed for smoking cessation. 100 tablet 0 Unknown at Unknown  . pantoprazole (PROTONIX) 40 MG tablet Take 1 tablet (40 mg total) by mouth daily. For acid reflux 30 tablet 0 Unknown at Unknown  . polyethylene glycol (MIRALAX / GLYCOLAX) packet Take 17 g by mouth daily. For constipation 14 each 0 Unknown at Unknown  . propranolol (INDERAL) 20 MG tablet Take 1 tablet (20 mg total) by mouth 2 (two) times daily. For high blood pressure 60 tablet 0 01/27/2017 at 0619  . simvastatin (ZOCOR) 20 MG tablet Take 1 tablet (20 mg total) by mouth daily at 6 PM. For high Cholesterol 30 tablet 0 Unknown at Unknown  . temazepam (RESTORIL) 7.5 MG capsule Take 1 capsule (7.5 mg total) by mouth at bedtime. For sleep 14 capsule 0 Unknown at Unknown   Patient Stressors: Health  problems Medication change or noncompliance  Patient Strengths: Active sense of humor Motivation for treatment/growth  Treatment Modalities: Medication Management, Group therapy, Case management,  1 to 1 session with clinician, Psychoeducation, Recreational therapy.   Physician Treatment Plan for Primary Diagnosis: Schizoaffective disorder, depressive type (HCC) Long Term Goal(s): Improvement in symptoms so as ready for discharge NA   Short Term Goals: Ability to identify changes in lifestyle to reduce recurrence of condition will improve Ability to verbalize feelings will improve Ability to disclose and  discuss suicidal ideas Ability to demonstrate self-control will improve Ability to identify and develop effective coping behaviors will improve Ability to maintain clinical measurements within normal limits will improve Ability to identify triggers associated with substance abuse/mental health issues will improve NA  Medication Management: Evaluate patient's response, side effects, and tolerance of medication regimen.  Therapeutic Interventions: 1 to 1 sessions, Unit Group sessions and Medication administration.  Evaluation of Outcomes: Progressing  Physician Treatment Plan for Secondary Diagnosis: Principal Problem:   Schizoaffective disorder, depressive type (HCC) Active Problems:   Diabetes (HCC)   HTN (hypertension)   Tobacco use disorder   Dyslipidemia   Tardive dyskinesia  Long Term Goal(s): Improvement in symptoms so as ready for discharge NA   Short Term Goals: Ability to identify changes in lifestyle to reduce recurrence of condition will improve Ability to verbalize feelings will improve Ability to disclose and discuss suicidal ideas Ability to demonstrate self-control will improve Ability to identify and develop effective coping behaviors will improve Ability to maintain clinical measurements within normal limits will improve Ability to identify  triggers associated with substance abuse/mental health issues will improve NA     Medication Management: Evaluate patient's response, side effects, and tolerance of medication regimen.  Therapeutic Interventions: 1 to 1 sessions, Unit Group sessions and Medication administration.  Evaluation of Outcomes: Progressing   RN Treatment Plan for Primary Diagnosis: Schizoaffective disorder, depressive type (HCC) Long Term Goal(s): Knowledge of disease and therapeutic regimen to maintain health will improve  Short Term Goals: Ability to identify and develop effective coping behaviors will improve and Compliance with prescribed medications will improve  Medication Management: RN will administer medications as ordered by provider, will assess and evaluate patient's response and provide education to patient for prescribed medication. RN will report any adverse and/or side effects to prescribing provider.  Therapeutic Interventions: 1 on 1 counseling sessions, Psychoeducation, Medication administration, Evaluate responses to treatment, Monitor vital signs and CBGs as ordered, Perform/monitor CIWA, COWS, AIMS and Fall Risk screenings as ordered, Perform wound care treatments as ordered.  Evaluation of Outcomes: Progressing   LCSW Treatment Plan for Primary Diagnosis: Schizoaffective disorder, depressive type (HCC) Long Term Goal(s): Safe transition to appropriate next level of care at discharge, Engage patient in therapeutic group addressing interpersonal concerns.  Short Term Goals: Engage patient in aftercare planning with referrals and resources and Increase skills for wellness and recovery  Therapeutic Interventions: Assess for all discharge needs, 1 to 1 time with Social worker, Explore available resources and support systems, Assess for adequacy in community support network, Educate family and significant other(s) on suicide prevention, Complete Psychosocial Assessment, Interpersonal  group therapy.  Evaluation of Outcomes: Progressing   Progress in Treatment: Attending groups: No. Participating in groups: No. Taking medication as prescribed: Yes. Toleration medication: Yes. Family/Significant other contact made: No, will contact:  attempts made Patient understands diagnosis: Yes. Discussing patient identified problems/goals with staff: Yes. Medical problems stabilized or resolved: Yes. Denies suicidal/homicidal ideation: Yes. Issues/concerns per patient self-inventory: No. Other: none  New problem(s) identified: No, Describe:  none  New Short Term/Long Term Goal(s): Pt unable to formulate a goal.  Discharge Plan or Barriers: Treatment team meeting on 01/29/17 identified [facility] day program and peer support as after care plan.  Discharge home with mom in [place]  Reason for Continuation of Hospitalization:  Estimated Length of Stay: 1-2 days   Attendees: Patient:Tony Cunningham 02/10/2017 12:39 PM  Physician: Braulio Conte [name] 02/10/2017 12:39 PM  Nursing: Hulan Amato, RN 02/10/2017 12:39  PM  RN Care Manager: 02/10/2017 12:39 PM  Social Worker: Jake Shark, LCSW 02/10/2017 12:39 PM  Recreational Therapist:  02/10/2017 12:39 PM  Other:  02/10/2017 12:39 PM  Other:  02/10/2017 12:39 PM  Other: 02/10/2017 12:39 PM    Scribe for Treatment Team: Glennon Mac, LCSW 02/10/2017 12:39 PM

## 2017-02-11 ENCOUNTER — Other Ambulatory Visit: Payer: Self-pay | Admitting: Psychiatry

## 2017-02-12 ENCOUNTER — Telehealth: Payer: Self-pay | Admitting: *Deleted

## 2017-02-14 ENCOUNTER — Encounter (HOSPITAL_COMMUNITY): Payer: Self-pay

## 2017-02-14 ENCOUNTER — Inpatient Hospital Stay (HOSPITAL_COMMUNITY)
Admission: AD | Admit: 2017-02-14 | Discharge: 2017-03-08 | DRG: 885 | Disposition: A | Discharge status: Home / Self Care | Payer: Medicare Other | Source: Intra-hospital | Attending: Psychiatry | Admitting: Psychiatry

## 2017-02-14 DIAGNOSIS — R454 Irritability and anger: Secondary | ICD-10-CM | POA: Diagnosis not present

## 2017-02-14 DIAGNOSIS — F22 Delusional disorders: Secondary | ICD-10-CM | POA: Diagnosis not present

## 2017-02-14 DIAGNOSIS — E119 Type 2 diabetes mellitus without complications: Secondary | ICD-10-CM | POA: Diagnosis not present

## 2017-02-14 DIAGNOSIS — I10 Essential (primary) hypertension: Secondary | ICD-10-CM | POA: Diagnosis present

## 2017-02-14 DIAGNOSIS — Z915 Personal history of self-harm: Secondary | ICD-10-CM

## 2017-02-14 DIAGNOSIS — Z888 Allergy status to other drugs, medicaments and biological substances status: Secondary | ICD-10-CM

## 2017-02-14 DIAGNOSIS — G4700 Insomnia, unspecified: Secondary | ICD-10-CM | POA: Diagnosis present

## 2017-02-14 DIAGNOSIS — T43595A Adverse effect of other antipsychotics and neuroleptics, initial encounter: Secondary | ICD-10-CM | POA: Diagnosis present

## 2017-02-14 DIAGNOSIS — T43505A Adverse effect of unspecified antipsychotics and neuroleptics, initial encounter: Secondary | ICD-10-CM | POA: Diagnosis present

## 2017-02-14 DIAGNOSIS — R4701 Aphasia: Secondary | ICD-10-CM | POA: Diagnosis present

## 2017-02-14 DIAGNOSIS — J45909 Unspecified asthma, uncomplicated: Secondary | ICD-10-CM | POA: Diagnosis present

## 2017-02-14 DIAGNOSIS — F39 Unspecified mood [affective] disorder: Secondary | ICD-10-CM | POA: Diagnosis not present

## 2017-02-14 DIAGNOSIS — R450 Nervousness: Secondary | ICD-10-CM | POA: Diagnosis not present

## 2017-02-14 DIAGNOSIS — G2111 Neuroleptic induced parkinsonism: Secondary | ICD-10-CM | POA: Diagnosis present

## 2017-02-14 DIAGNOSIS — Z79899 Other long term (current) drug therapy: Secondary | ICD-10-CM | POA: Diagnosis not present

## 2017-02-14 DIAGNOSIS — E1165 Type 2 diabetes mellitus with hyperglycemia: Secondary | ICD-10-CM | POA: Diagnosis present

## 2017-02-14 DIAGNOSIS — K219 Gastro-esophageal reflux disease without esophagitis: Secondary | ICD-10-CM | POA: Diagnosis present

## 2017-02-14 DIAGNOSIS — E785 Hyperlipidemia, unspecified: Secondary | ICD-10-CM | POA: Diagnosis present

## 2017-02-14 DIAGNOSIS — F251 Schizoaffective disorder, depressive type: Principal | ICD-10-CM | POA: Diagnosis present

## 2017-02-14 DIAGNOSIS — Z794 Long term (current) use of insulin: Secondary | ICD-10-CM | POA: Diagnosis not present

## 2017-02-14 DIAGNOSIS — R32 Unspecified urinary incontinence: Secondary | ICD-10-CM | POA: Diagnosis present

## 2017-02-14 DIAGNOSIS — F203 Undifferentiated schizophrenia: Secondary | ICD-10-CM | POA: Diagnosis not present

## 2017-02-14 DIAGNOSIS — F209 Schizophrenia, unspecified: Secondary | ICD-10-CM | POA: Diagnosis present

## 2017-02-14 DIAGNOSIS — R45850 Homicidal ideations: Secondary | ICD-10-CM | POA: Diagnosis not present

## 2017-02-14 DIAGNOSIS — F419 Anxiety disorder, unspecified: Secondary | ICD-10-CM | POA: Diagnosis present

## 2017-02-14 DIAGNOSIS — E11649 Type 2 diabetes mellitus with hypoglycemia without coma: Secondary | ICD-10-CM | POA: Diagnosis present

## 2017-02-14 DIAGNOSIS — F17210 Nicotine dependence, cigarettes, uncomplicated: Secondary | ICD-10-CM | POA: Diagnosis not present

## 2017-02-14 DIAGNOSIS — R45851 Suicidal ideations: Secondary | ICD-10-CM | POA: Diagnosis not present

## 2017-02-14 DIAGNOSIS — R443 Hallucinations, unspecified: Secondary | ICD-10-CM | POA: Diagnosis not present

## 2017-02-14 DIAGNOSIS — R4587 Impulsiveness: Secondary | ICD-10-CM | POA: Diagnosis not present

## 2017-02-14 DIAGNOSIS — R413 Other amnesia: Secondary | ICD-10-CM | POA: Diagnosis not present

## 2017-02-14 DIAGNOSIS — R440 Auditory hallucinations: Secondary | ICD-10-CM | POA: Diagnosis not present

## 2017-02-14 LAB — GLUCOSE, CAPILLARY: Glucose-Capillary: 324 mg/dL — ABNORMAL HIGH (ref 65–99)

## 2017-02-14 MED ORDER — CLOZAPINE 25 MG PO TABS
150.0000 mg | ORAL_TABLET | Freq: Every day | ORAL | Status: DC
  Administered 2017-02-14 – 2017-03-07 (×22): 150 mg via ORAL
  Filled 2017-02-14 (×25): qty 2

## 2017-02-14 MED ORDER — PROPRANOLOL HCL 20 MG PO TABS
20.0000 mg | ORAL_TABLET | Freq: Two times a day (BID) | ORAL | Status: DC
  Administered 2017-02-15 – 2017-03-08 (×40): 20 mg via ORAL
  Filled 2017-02-14 (×7): qty 1
  Filled 2017-02-14: qty 14
  Filled 2017-02-14 (×7): qty 1
  Filled 2017-02-14 (×2): qty 14
  Filled 2017-02-14 (×5): qty 1
  Filled 2017-02-14: qty 14
  Filled 2017-02-14: qty 1
  Filled 2017-02-14: qty 14
  Filled 2017-02-14 (×12): qty 1
  Filled 2017-02-14: qty 14
  Filled 2017-02-14: qty 1
  Filled 2017-02-14: qty 2
  Filled 2017-02-14 (×2): qty 1
  Filled 2017-02-14: qty 14
  Filled 2017-02-14 (×3): qty 1
  Filled 2017-02-14: qty 14
  Filled 2017-02-14 (×2): qty 1

## 2017-02-14 MED ORDER — PANTOPRAZOLE SODIUM 40 MG PO TBEC
40.0000 mg | DELAYED_RELEASE_TABLET | Freq: Every day | ORAL | Status: DC
  Administered 2017-02-15 – 2017-03-08 (×22): 40 mg via ORAL
  Filled 2017-02-14 (×3): qty 1
  Filled 2017-02-14: qty 7
  Filled 2017-02-14 (×4): qty 1
  Filled 2017-02-14: qty 7
  Filled 2017-02-14 (×8): qty 1
  Filled 2017-02-14: qty 7
  Filled 2017-02-14 (×2): qty 1
  Filled 2017-02-14: qty 7
  Filled 2017-02-14 (×3): qty 1

## 2017-02-14 MED ORDER — MAGNESIUM HYDROXIDE 400 MG/5ML PO SUSP: 30.0000 mL | Freq: Every day | ORAL | Status: DC | PRN

## 2017-02-14 MED ORDER — ALUM & MAG HYDROXIDE-SIMETH 200-200-20 MG/5ML PO SUSP: 30.0000 mL | ORAL | Status: DC | PRN

## 2017-02-14 MED ORDER — TRAZODONE HCL 50 MG PO TABS
50.0000 mg | ORAL_TABLET | Freq: Every evening | ORAL | Status: DC | PRN
  Administered 2017-02-14 – 2017-03-07 (×36): 50 mg via ORAL
  Filled 2017-02-14 (×5): qty 1
  Filled 2017-02-14: qty 14
  Filled 2017-02-14 (×8): qty 1
  Filled 2017-02-14: qty 14
  Filled 2017-02-14 (×6): qty 1
  Filled 2017-02-14: qty 14
  Filled 2017-02-14: qty 1
  Filled 2017-02-14: qty 14
  Filled 2017-02-14 (×4): qty 1
  Filled 2017-02-14: qty 14
  Filled 2017-02-14 (×8): qty 1
  Filled 2017-02-14: qty 14
  Filled 2017-02-14 (×6): qty 1
  Filled 2017-02-14: qty 14
  Filled 2017-02-14 (×2): qty 1
  Filled 2017-02-14: qty 14
  Filled 2017-02-14: qty 1
  Filled 2017-02-14: qty 14
  Filled 2017-02-14 (×4): qty 1

## 2017-02-14 MED ORDER — FLUOXETINE HCL 20 MG PO CAPS
40.0000 mg | ORAL_CAPSULE | Freq: Every day | ORAL | Status: DC
  Administered 2017-02-15 – 2017-03-08 (×22): 40 mg via ORAL
  Filled 2017-02-14 (×4): qty 2
  Filled 2017-02-14: qty 14
  Filled 2017-02-14 (×2): qty 2
  Filled 2017-02-14: qty 14
  Filled 2017-02-14 (×2): qty 2
  Filled 2017-02-14: qty 14
  Filled 2017-02-14 (×13): qty 2
  Filled 2017-02-14: qty 14

## 2017-02-14 MED ORDER — METFORMIN HCL 500 MG PO TABS
1000.0000 mg | ORAL_TABLET | Freq: Two times a day (BID) | ORAL | Status: DC
  Administered 2017-02-15 – 2017-03-08 (×43): 1000 mg via ORAL
  Filled 2017-02-14: qty 28
  Filled 2017-02-14 (×3): qty 2
  Filled 2017-02-14: qty 28
  Filled 2017-02-14 (×10): qty 2
  Filled 2017-02-14: qty 28
  Filled 2017-02-14 (×2): qty 2
  Filled 2017-02-14: qty 28
  Filled 2017-02-14 (×4): qty 2
  Filled 2017-02-14: qty 28
  Filled 2017-02-14 (×8): qty 2
  Filled 2017-02-14: qty 28
  Filled 2017-02-14 (×12): qty 2
  Filled 2017-02-14 (×2): qty 28
  Filled 2017-02-14: qty 2

## 2017-02-14 MED ORDER — ACETAMINOPHEN 325 MG PO TABS
650.0000 mg | ORAL_TABLET | Freq: Four times a day (QID) | ORAL | Status: DC | PRN
  Administered 2017-02-19: 650 mg via ORAL
  Filled 2017-02-14: qty 2

## 2017-02-14 MED ORDER — INSULIN ASPART 100 UNIT/ML ~~LOC~~ SOLN
5.0000 [IU] | Freq: Three times a day (TID) | SUBCUTANEOUS | Status: DC
  Administered 2017-02-15 – 2017-02-23 (×15): 5 [IU] via SUBCUTANEOUS
  Administered 2017-02-23: 2 [IU] via SUBCUTANEOUS
  Administered 2017-02-23 – 2017-02-25 (×5): 5 [IU] via SUBCUTANEOUS
  Administered 2017-02-26: 2 [IU] via SUBCUTANEOUS
  Administered 2017-02-26 – 2017-03-04 (×9): 5 [IU] via SUBCUTANEOUS
  Administered 2017-03-04: 2 [IU] via SUBCUTANEOUS
  Administered 2017-03-04 – 2017-03-07 (×6): 5 [IU] via SUBCUTANEOUS
  Filled 2017-02-14: qty 0.05

## 2017-02-14 MED ORDER — MONTELUKAST SODIUM 10 MG PO TABS
10.0000 mg | ORAL_TABLET | Freq: Every day | ORAL | Status: DC
  Administered 2017-02-14 – 2017-03-07 (×22): 10 mg via ORAL
  Filled 2017-02-14 (×11): qty 1
  Filled 2017-02-14 (×2): qty 7
  Filled 2017-02-14: qty 1
  Filled 2017-02-14: qty 7
  Filled 2017-02-14 (×2): qty 1
  Filled 2017-02-14: qty 7
  Filled 2017-02-14 (×8): qty 1

## 2017-02-14 MED ORDER — LISINOPRIL 20 MG PO TABS
20.0000 mg | ORAL_TABLET | Freq: Every day | ORAL | Status: DC
  Administered 2017-02-16 – 2017-03-08 (×21): 20 mg via ORAL
  Filled 2017-02-14 (×8): qty 1
  Filled 2017-02-14: qty 7
  Filled 2017-02-14: qty 1
  Filled 2017-02-14: qty 7
  Filled 2017-02-14 (×3): qty 1
  Filled 2017-02-14: qty 7
  Filled 2017-02-14 (×6): qty 1
  Filled 2017-02-14: qty 7
  Filled 2017-02-14 (×3): qty 1

## 2017-02-14 MED ORDER — INSULIN GLARGINE 100 UNIT/ML ~~LOC~~ SOLN
40.0000 [IU] | Freq: Every day | SUBCUTANEOUS | Status: DC
  Administered 2017-02-14 – 2017-03-07 (×22): 40 [IU] via SUBCUTANEOUS
  Filled 2017-02-14: qty 0.4

## 2017-02-14 NOTE — Tx Team (Signed)
Initial Treatment Plan 02/14/2017 10:42 PM Tony Cunningham LEX:[id number]    PATIENT STRESSORS: Marital or family conflict Other: blood sugar   PATIENT STRENGTHS: General fund of knowledge Motivation for treatment/growth   PATIENT IDENTIFIED PROBLEMS: Risk for suicide  depression  Increased aggression  "someplace to live"               DISCHARGE CRITERIA:  Adequate post-discharge living arrangements Improved stabilization in mood, thinking, and/or behavior Verbal commitment to aftercare and medication compliance  PRELIMINARY DISCHARGE PLAN: Attend aftercare/continuing care group Outpatient therapy Placement in alternative living arrangements  PATIENT/FAMILY INVOLVEMENT: This treatment plan has been presented to and reviewed with the patient, Tony Cunningham.  The patient and family have been given the opportunity to ask questions and make suggestions.  Delos Haring, RN 02/14/2017, 10:42 PM
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
## 2017-02-14 NOTE — Progress Notes (Signed)
Patient ID: Tony Cunningham, male   DOB: 21-Dec-1975, 41 y.o.   MRN: [id number] PER STATE REGULATIONS 482.30  THIS CHART WAS REVIEWED FOR MEDICAL NECESSITY WITH RESPECT TO THE PATIENT'S ADMISSION/DURATION OF STAY.  NEXT REVIEW DATE:02/18/17  Loura Halt, RN, BSN CASE MANAGER
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
## 2017-02-15 DIAGNOSIS — R440 Auditory hallucinations: Secondary | ICD-10-CM

## 2017-02-15 DIAGNOSIS — G4700 Insomnia, unspecified: Secondary | ICD-10-CM

## 2017-02-15 DIAGNOSIS — R413 Other amnesia: Secondary | ICD-10-CM

## 2017-02-15 DIAGNOSIS — R4587 Impulsiveness: Secondary | ICD-10-CM

## 2017-02-15 DIAGNOSIS — R45850 Homicidal ideations: Secondary | ICD-10-CM

## 2017-02-15 DIAGNOSIS — F419 Anxiety disorder, unspecified: Secondary | ICD-10-CM

## 2017-02-15 DIAGNOSIS — F17210 Nicotine dependence, cigarettes, uncomplicated: Secondary | ICD-10-CM

## 2017-02-15 DIAGNOSIS — R454 Irritability and anger: Secondary | ICD-10-CM

## 2017-02-15 DIAGNOSIS — F22 Delusional disorders: Secondary | ICD-10-CM

## 2017-02-15 DIAGNOSIS — R45851 Suicidal ideations: Secondary | ICD-10-CM

## 2017-02-15 DIAGNOSIS — R450 Nervousness: Secondary | ICD-10-CM

## 2017-02-15 LAB — GLUCOSE, CAPILLARY
GLUCOSE-CAPILLARY: 125 mg/dL — AB (ref 65–99)
GLUCOSE-CAPILLARY: 188 mg/dL — AB (ref 65–99)
Glucose-Capillary: 310 mg/dL — ABNORMAL HIGH (ref 65–99)
Glucose-Capillary: 151 mg/dL — ABNORMAL HIGH (ref 65–99)
Glucose-Capillary: 375 mg/dL — ABNORMAL HIGH (ref 65–99)

## 2017-02-15 MED ORDER — NICOTINE 21 MG/24HR TD PT24
21.0000 mg | MEDICATED_PATCH | Freq: Every day | TRANSDERMAL | Status: DC
  Filled 2017-02-15 (×14): qty 1

## 2017-02-15 MED ORDER — OLANZAPINE 10 MG IM SOLR: 5.0000 mg | Freq: Three times a day (TID) | INTRAMUSCULAR | Status: DC | PRN

## 2017-02-15 MED ORDER — INSULIN ASPART 100 UNIT/ML ~~LOC~~ SOLN
0.0000 [IU] | Freq: Three times a day (TID) | SUBCUTANEOUS | Status: DC
  Administered 2017-02-15: 2 [IU] via SUBCUTANEOUS
  Administered 2017-02-15 – 2017-02-16 (×2): 3 [IU] via SUBCUTANEOUS
  Administered 2017-02-16: 5 [IU] via SUBCUTANEOUS
  Administered 2017-02-16: 15 [IU] via SUBCUTANEOUS
  Administered 2017-02-17: 5 [IU] via SUBCUTANEOUS
  Administered 2017-02-17 – 2017-02-18 (×2): 3 [IU] via SUBCUTANEOUS
  Administered 2017-02-18: 11 [IU] via SUBCUTANEOUS
  Administered 2017-02-18: 3 [IU] via SUBCUTANEOUS
  Administered 2017-02-19: 15 [IU] via SUBCUTANEOUS
  Administered 2017-02-20: 11 [IU] via SUBCUTANEOUS
  Administered 2017-02-21: 2 [IU] via SUBCUTANEOUS
  Administered 2017-02-21: 5 [IU] via SUBCUTANEOUS
  Administered 2017-02-22: 2 [IU] via SUBCUTANEOUS
  Administered 2017-02-22: 5 [IU] via SUBCUTANEOUS
  Administered 2017-02-22: 3 [IU] via SUBCUTANEOUS
  Administered 2017-02-23: 2 [IU] via SUBCUTANEOUS
  Administered 2017-02-23: 0 [IU] via SUBCUTANEOUS
  Administered 2017-02-23: 15 [IU] via SUBCUTANEOUS
  Administered 2017-02-24: 2 [IU] via SUBCUTANEOUS
  Administered 2017-02-24: 15 [IU] via SUBCUTANEOUS
  Administered 2017-02-25 – 2017-02-26 (×2): 5 [IU] via SUBCUTANEOUS
  Administered 2017-02-27 – 2017-02-28 (×2): 3 [IU] via SUBCUTANEOUS
  Administered 2017-03-01: 5 [IU] via SUBCUTANEOUS
  Administered 2017-03-01: 3 [IU] via SUBCUTANEOUS
  Administered 2017-03-02: 15 [IU] via SUBCUTANEOUS
  Administered 2017-03-03 – 2017-03-04 (×2): 11 [IU] via SUBCUTANEOUS
  Administered 2017-03-04 – 2017-03-05 (×2): 3 [IU] via SUBCUTANEOUS
  Administered 2017-03-05: 11 [IU] via SUBCUTANEOUS
  Administered 2017-03-06: 2 [IU] via SUBCUTANEOUS
  Administered 2017-03-06: 3 [IU] via SUBCUTANEOUS
  Administered 2017-03-07: 2 [IU] via SUBCUTANEOUS
  Administered 2017-03-07: 11 [IU] via SUBCUTANEOUS

## 2017-02-15 MED ORDER — OLANZAPINE 5 MG PO TBDP
5.0000 mg | ORAL_TABLET | Freq: Three times a day (TID) | ORAL | Status: DC | PRN
  Administered 2017-02-19: 5 mg via ORAL
  Filled 2017-02-15: qty 1

## 2017-02-15 MED ORDER — INSULIN ASPART 100 UNIT/ML ~~LOC~~ SOLN
0.0000 [IU] | Freq: Every day | SUBCUTANEOUS | Status: DC
  Administered 2017-02-15: 5 [IU] via SUBCUTANEOUS
  Administered 2017-02-16 – 2017-02-17 (×2): 2 [IU] via SUBCUTANEOUS
  Administered 2017-02-18: 4 [IU] via SUBCUTANEOUS
  Administered 2017-02-19: 3 [IU] via SUBCUTANEOUS
  Administered 2017-02-20: 5 [IU] via SUBCUTANEOUS
  Administered 2017-02-21 – 2017-02-22 (×2): 2 [IU] via SUBCUTANEOUS
  Administered 2017-02-23 – 2017-02-24 (×2): 4 [IU] via SUBCUTANEOUS
  Administered 2017-02-25: 3 [IU] via SUBCUTANEOUS
  Administered 2017-02-26: 5 [IU] via SUBCUTANEOUS
  Administered 2017-02-27: 4 [IU] via SUBCUTANEOUS
  Administered 2017-03-02: 3 [IU] via SUBCUTANEOUS
  Administered 2017-03-05 – 2017-03-06 (×2): 2 [IU] via SUBCUTANEOUS
  Administered 2017-03-07: 4 [IU] via SUBCUTANEOUS

## 2017-02-15 NOTE — Progress Notes (Addendum)
Patient walked up to medication window after multiple prompts.  Reached medication window and began swaying left and right, staff member assisted patient to balance- obtained standing blood pressure- normal but slightly tachycardic (see flowsheet).  Patient somnolent and pausing, not responding to all questions.  Patient unable to name year.  Grips equal bilaterally, face symetrical.  Smelled of urine.  MD notified and observed patient, patient sat in wheelchair- blood sugar 151.  Patient hadn't eaten breakfast yet.  MHT stayed with patient and encouraged patient to eat some breakfast.  Patient ate cinammon roll, some eggs, all of bacon.  Patient woke up some more after eating, was responding appropriately.  Agreed to notify staff before getting out of bed.  Will monitor per MD.Physical chart provided to MD for review of labs obtained at [facility] ED.

## 2017-02-15 NOTE — H&P (Signed)
Psychiatric Admission Assessment Adult  Patient Identification: Tony Cunningham  MRN:  [id number]  Date of Evaluation:  02/15/2017  Chief Complaint: "Depression and voices.'    Principal Diagnosis: Schizoaffective disorder, depressive type  Diagnosis:   Patient Active Problem List   Diagnosis Date Noted  . 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 Schizoaffective disorder, depressive type (HCC) [F25.1] 09/23/2016  . Polysubstance abuse [F19.10] 07/17/2016  . Tardive dyskinesia [G24.01] 07/16/2016  . Tobacco use disorder [F17.200] 07/07/2016  . Dyslipidemia [E78.5] 07/07/2016  . Asthma [J45.909] 07/07/2016  . HTN (hypertension) [I10] 07/06/2016  . Diabetes (HCC) [E11.9] 12/25/2010   History of Present Illness: Tony Cunningham is a 57 y old CM who is single , has a chronic hx of mental illness as well as several recent IP admissions, presented to [facility] from [facility] with worsening SI/HI [name].  Patient seen and chart reviewed.Discussed patient with treatment team. Attempted to evaluate patient several times today. This AM RN notified writer that patient appeared lethargic and had urinary incontinence. However as the day progressed he became more organized and was noted as taking shower , able to take care of ADL s as well as sit down for an evaluation.   Patient reported that he became more and more depressed and started hearing mean voices. He reported that he had thoughts to hurt his mother and that kind of scared him. Pt unable to elaborate what exactly the voices tell him. Pt noted as having some thought blocking as well as some difficulty remembering events from the past . Pt had ECT treatments at [facility] recently which could have led to his memory issues. Pt became tearful when Clinical research associate discussed this.  Pt being a poor historian majority of information obtained from EHR . Patient was just discharged from [facility] on 02/10/2017 . He was admitted there since the 4  th of august. Pt was admitted to [facility] with suicidal thoughts.Pt received ECT treatments at [facility] and was discharged with plan to have maintenance ECT on the 28 th September. Pt per notes had to be coached with discharge plan and was hesitant to get out of the hospital.   Pt also on Clozaril - unknown how effective . He has failed multiple medication trials in the past , he also has neuroleptic induced parkinsonian sx.      Associated Signs/Symptoms: Depression Symptoms:  depressed mood, difficulty concentrating, impaired memory, recurrent thoughts of death, anxiety,  (Hypo) Manic Symptoms:  Delusions, Hallucinations, Impulsivity, Irritable Mood,  Anxiety Symptoms:  Excessive Worry,  Psychotic Symptoms:  Delusions, Hallucinations: Auditory Command:  hurt self and hurt his mother Paranoia,  PTSD Symptoms: Negative  Total Time spent with patient: 45 minutes  Past Psychiatric History: Patient with long standing hx of mental illness as well as multiple hospitalziations . Recently was at [facility] , prior to that at [facility]. Pt was receiving ECT treatments at [facility] , was supposed to return for maintenance treatment on the 28 th of this month. Pt does report suicide attempts in the past as well as cutting behavior.    Is the patient at risk to self? Yes.    Has the patient been a risk to self in the past 6 months? Yes.    Has the patient been a risk to self within the distant past? Yes.    Is the patient a risk to others? Yes.    Has the patient been a risk to others in the past 6 months? No.  Has the  patient been a risk to others within the distant past? No.   Prior Inpatient Therapy: Yes, [facility] , [facility] x multiple times. Prior Outpatient Therapy: Yes  Alcohol Screening: 1. How often do you have a drink containing alcohol?: Never 3. How often do you have six or more drinks on one occasion?: Never 4. How often during the last year have you found that you were not able to stop drinking  once you had started?: Never 5. How often during the last year have you failed to do what was normally expected from you becasue of drinking?: Never 6. How often during the last year have you needed a first drink in the morning to get yourself going after a heavy drinking session?: Never 7. How often during the last year have you had a feeling of guilt of remorse after drinking?: Never 8. How often during the last year have you been unable to remember what happened the night before because you had been drinking?: Never 9. Have you or someone else been injured as a result of your drinking?: No 10. Has a relative or friend or a doctor or another health worker been concerned about your drinking or suggested you cut down?: No Alcohol Use Disorder Identification Test Final Score (AUDIT): 0 Brief Intervention: AUDIT score less than 7 or less-screening does not suggest unhealthy drinking-brief intervention not indicated  Substance Abuse History in the last 12 months:  No.  Consequences of Substance Abuse:NA  Previous Psychotropic Medications:YES , clozaril, luvox, trazodone ,abilify, zyprexa, geodon, haldol, latuda , effexor , celexa, remeron .  Psychological Evaluations: Yes  Past Medical History:  Past Medical History:  Diagnosis Date  . Anxiety   . Asthma   . Diabetes mellitus   . High blood pressure   . Sinus complaint    History reviewed. No pertinent surgical history.  Family History: unknown, will need collateral information  Family Psychiatric  History: Pt denies   Tobacco Screening: Have you used any form of tobacco in the last 30 days? (Cigarettes, Smokeless Tobacco, Cigars, and/or Pipes): Yes Tobacco use, Select all that apply: 4 or less cigarettes per day Are you interested in Tobacco Cessation Medications?: Yes, will notify MD for an order Counseled patient on smoking cessation including recognizing danger situations, developing coping skills and basic information about  quitting provided: Yes  Social History: Pt used to live in a [facility] . Pt most recently was living with mother in [place], [place] , has 11 th grade education. History  Alcohol Use No     History  Drug Use No    Additional Social History:  Allergies:   Allergies  Allergen Reactions  . Seroquel [Quetiapine Fumarate]     dizziness    Lab Results:  Results for orders placed or performed during the hospital encounter of 02/14/17 (from the past 48 hour(s))  Glucose, capillary     Status: Abnormal   Collection Time: 02/14/17  9:06 PM  Result Value Ref Range   Glucose-Capillary 324 (H) 65 - 99 mg/dL   Comment 1 Notify RN    Comment 2 Document in Chart   Glucose, capillary     Status: Abnormal   Collection Time: 02/15/17  6:37 AM  Result Value Ref Range   Glucose-Capillary 310 (H) 65 - 99 mg/dL  Glucose, capillary     Status: Abnormal   Collection Time: 02/15/17  9:25 AM  Result Value Ref Range   Glucose-Capillary 151 (H) 65 - 99 mg/dL  Glucose, capillary  Status: Abnormal   Collection Time: 02/15/17 11:55 AM  Result Value Ref Range   Glucose-Capillary 125 (H) 65 - 99 mg/dL   Blood Alcohol level:  Lab Results  Component Value Date   ETH <5 01/16/2017   ETH <5 12/21/2016   Metabolic Disorder Labs:  Lab Results  Component Value Date   HGBA1C 7.6 (H) 01/19/2017   MPG 171.42 01/19/2017   MPG 249 11/09/2016   Lab Results  Component Value Date   PROLACTIN 24.5 (H) 09/24/2016   PROLACTIN 3.4 (L) 07/07/2016   Lab Results  Component Value Date   CHOL 122 01/19/2017   TRIG 154 (H) 01/19/2017   HDL 36 (L) 01/19/2017   CHOLHDL 3.4 01/19/2017   VLDL 31 01/19/2017   LDLCALC 55 01/19/2017   LDLCALC 59 12/28/2016   Current Medications: Current Facility-Administered Medications  Medication Dose Route Frequency Provider Last Rate Last Dose  . acetaminophen (TYLENOL) tablet 650 mg  650 mg Oral Q6H PRN Nira Conn A, NP      . alum & mag hydroxide-simeth (MAALOX/MYLANTA)  200-200-20 MG/5ML suspension 30 mL  30 mL Oral Q4H PRN Nira Conn A, NP      . cloZAPine (CLOZARIL) tablet 150 mg  150 mg Oral QHS Nira Conn A, NP   150 mg at 02/14/17 2206  . FLUoxetine (PROZAC) capsule 40 mg  40 mg Oral Daily Nira Conn A, NP   40 mg at 02/15/17 1001  . insulin aspart (novoLOG) injection 0-15 Units  0-15 Units Subcutaneous TID WC Jomarie Longs, MD   2 Units at 02/15/17 1319  . insulin aspart (novoLOG) injection 0-5 Units  0-5 Units Subcutaneous QHS Allexus Ovens, MD      . insulin aspart (novoLOG) injection 5 Units  5 Units Subcutaneous TID WC Nira Conn A, NP   5 Units at 02/15/17 1316  . insulin glargine (LANTUS) injection 40 Units  40 Units Subcutaneous QHS Nira Conn A, NP   40 Units at 02/14/17 2208  . lisinopril (PRINIVIL,ZESTRIL) tablet 20 mg  20 mg Oral Daily Jackelyn Poling, NP   Stopped at 02/15/17 [phone]  . magnesium hydroxide (MILK OF MAGNESIA) suspension 30 mL  30 mL Oral Daily PRN Nira Conn A, NP      . metFORMIN (GLUCOPHAGE) tablet 1,000 mg  1,000 mg Oral BID WC Nira Conn A, NP   1,000 mg at 02/15/17 1002  . montelukast (SINGULAIR) tablet 10 mg  10 mg Oral QHS Nira Conn A, NP   10 mg at 02/14/17 2207  . OLANZapine zydis (ZYPREXA) disintegrating tablet 5 mg  5 mg Oral TID PRN Jomarie Longs, MD       Or  . OLANZapine (ZYPREXA) injection 5 mg  5 mg Intramuscular TID PRN Derrick Orris, MD      . pantoprazole (PROTONIX) EC tablet 40 mg  40 mg Oral Daily Nira Conn A, NP   40 mg at 02/15/17 1001  . propranolol (INDERAL) tablet 20 mg  20 mg Oral BID Jackelyn Poling, NP   Stopped at 02/15/17 [phone]  . traZODone (DESYREL) tablet 50 mg  50 mg Oral QHS,MR X 1 Nira Conn A, NP   50 mg at 02/14/17 2207   PTA Medications: Prescriptions Prior to Admission  Medication Sig Dispense Refill Last Dose  . amantadine (SYMMETREL) 100 MG capsule Take 100 mg by mouth 2 (two) times daily.   02/11/2017  . citalopram (CELEXA) 20 MG tablet Take 20 mg by mouth  daily.  02/11/2017  . cloZAPine (CLOZARIL) 50 MG tablet Take 3 tablets (150 mg total) by mouth at bedtime. 90 tablet 1 02/11/2017  . FLUoxetine (PROZAC) 40 MG capsule Take 1 capsule (40 mg total) by mouth daily. 30 capsule 1 02/11/2017  . insulin aspart (NOVOLOG) 100 UNIT/ML injection Inject 5 Units into the skin 3 (three) times daily with meals. (Patient taking differently: Inject 6 Units into the skin 3 (three) times daily with meals. ) 10 mL 2 02/11/2017  . insulin glargine (LANTUS) 100 UNIT/ML injection Inject 0.4 mLs (40 Units total) into the skin at bedtime. (Patient taking differently: Inject 45 Units into the skin at bedtime. ) 10 mL 2 02/11/2017  . lisinopril (PRINIVIL,ZESTRIL) 20 MG tablet Take 1 tablet (20 mg total) by mouth daily. 30 tablet 1 02/11/2017  . metFORMIN (GLUCOPHAGE) 1000 MG tablet Take 1 tablet (1,000 mg total) by mouth 2 (two) times daily with a meal. 60 tablet 1 02/11/2017  . pantoprazole (PROTONIX) 40 MG tablet Take 1 tablet (40 mg total) by mouth daily. 30 tablet 1 02/11/2017  . propranolol (INDERAL) 20 MG tablet Take 1 tablet (20 mg total) by mouth 2 (two) times daily. 60 tablet 1 02/11/2017  . simvastatin (ZOCOR) 20 MG tablet Take 20 mg by mouth daily.   02/11/2017  . temazepam (RESTORIL) 7.5 MG capsule Take 7.5 mg by mouth at bedtime.   02/11/2017  . montelukast (SINGULAIR) 10 MG tablet Take 1 tablet (10 mg total) by mouth at bedtime. (Patient not taking: Reported on 02/15/2017) 30 tablet 1 Not Taking at Unknown time   Musculoskeletal: Strength & Muscle Tone: within normal limits Gait & Station: normal Patient leans: [name]  Psychiatric Specialty Exam: Physical Exam  Nursing note and vitals reviewed. Eyes: Pupils are equal, round, and reactive to light.    Review of Systems  Psychiatric/Behavioral: Positive for depression, hallucinations, memory loss and suicidal ideas. The patient is nervous/anxious and has insomnia.     Blood pressure 116/81, pulse (!) 115, temperature  99.2 F (37.3 C), temperature source Oral, resp. rate 16, height 6' (1.829 m), weight 76.7 kg (169 lb).Body mass index is 22.92 kg/m.  General Appearance: Guarded  Eye Contact:  Poor  Speech:  Normal Rate thought blocking   Volume:  Decreased  Mood:  Depressed  Affect:  Tearful  Thought Process:  Disorganized and Descriptions of Associations: Circumstantial  Orientation:  Other:  self, place, situation  Thought Content:  Delusions, Hallucinations: Auditory Command:  hurt self and others, Paranoid Ideation and Rumination,  Suicidal Thoughts:  Yes.  without intent/plan  Homicidal Thoughts:  Yes.  without intent/plan  Memory:  Immediate;   Fair Recent;   Poor Remote;   Poor  Judgement:  Impaired  Insight:  Fair  Psychomotor Activity:  Decreased  Concentration:  Concentration: Poor and Attention Span: Poor  Recall:  Poor  Fund of Knowledge:  Poor  Language:  Fair  Akathisia:  Negative  Handed:  Right  AIMS (if indicated):     Assets:  Desire for Improvement  ADL's:  Intact  Cognition:  Impaired,  Mild  Sleep:  Number of Hours: 5.5   Treatment Plan/Recommendations: Patient with schizoaffective do , presents as disorganized , responding to internal stimuli, thought blocking as well as SI/HI. Pt has failed multiple trials of medications , some response to  ECT treatment . Pt with some cognitive changes , possible memory issues, unknown if this is due to the most recent ECT or likely due to him decompensating.  Pt will likely benefit from long term placement in a structured supervised setting. Will continue treatment.    Patient will benefit from inpatient treatment and stabilization.  Estimated length of stay is 5-7 days.  Clozaril 150 mg po daily for psychosis. Prozac 40 mg po daily for affective sx. Propranolol 20 mg po bid for anxiety, tachycardia. Trazodone 50 mg po qhs for insomnia. Reviewed past medical records,treatment plan.  Will continue to monitor vitals ,medication  compliance and treatment side effects while patient is here.  Will monitor for medical issues as well as call consult as needed.  Restart Diabetic medications as per MAR. CBGs as per unit protocol. Reviewed labs  Na+ - low , will repeat , rpr - NR, UA - negative , ,will order metabolic panel as well as EKG for qtc if not already done. CSW will start working on disposition. Will refer for ECT treatment at [facility] - / [facility] to [facility] . Patient to participate in therapeutic milieu .       Observation Level/Precautions:  15 minute checks    Psychotherapy: Individual and group therapy     Consultations: CSW  Discharge Concerns: Stability and safety        Long Term Goal(s): Improvement in symptoms so as ready for discharge  Short Term Goals: Ability to verbalize feelings will improve, Ability to demonstrate self-control will improve and Compliance with prescribed medications will improve  Physician Treatment Plan for Secondary Diagnosis: Principal Problem:   Schizoaffective disorder, depressive type (HCC) Active Problems:   Diabetes (HCC)   Neuroleptic-induced Parkinsonism (HCC)  Long Term Goal(s): Improvement in symptoms so as ready for discharge  Short Term Goals: Ability to verbalize feelings will improve, Ability to identify and develop effective coping behaviors will improve and Compliance with prescribed medications will improve  I certify that inpatient services furnished can reasonably be expected to improve the patient's condition.    Jomarie Longs, MD 9/24/20182:38 PM

## 2017-02-15 NOTE — [facility] Group Notes (Signed)
LCSW Group Therapy Note   02/15/2017 1:15pm   Type of Therapy and Topic:  Group Therapy:  Overcoming Obstacles   Participation Level:  Did Not Attend   Description of Group:    In this group patients will be encouraged to explore what they see as obstacles to their own wellness and recovery. They will be guided to discuss their thoughts, feelings, and behaviors related to these obstacles. The group will process together ways to cope with barriers, with attention given to specific choices patients can make. Each patient will be challenged to identify changes they are motivated to make in order to overcome their obstacles. This group will be process-oriented, with patients participating in exploration of their own experiences as well as giving and receiving support and challenge from other group members.   Therapeutic Goals: 1. Patient will identify personal and current obstacles as they relate to admission. 2. Patient will identify barriers that currently interfere with their wellness or overcoming obstacles.  3. Patient will identify feelings, thought process and behaviors related to these barriers. 4. Patient will identify two changes they are willing to make to overcome these obstacles:      Summary of Patient Progress      Therapeutic Modalities:   Cognitive Behavioral Therapy Solution Focused Therapy Motivational Interviewing Relapse Prevention Therapy  Ida Rogue, LCSW 02/15/2017 3:15 PM

## 2017-02-15 NOTE — [facility] Suicide Risk Assessment (Signed)
[facility] Admission Suicide Risk Assessment   Nursing information obtained from:    Demographic factors:    Current Mental Status:    Loss Factors:    Historical Factors:    Risk Reduction Factors:     Total Time spent with patient: 20 minutes Principal Problem: Schizoaffective disorder, depressive type (HCC) Diagnosis:   Patient Active Problem List   Diagnosis Date Noted  . 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 Schizoaffective disorder, depressive type (HCC) [F25.1] 09/23/2016  . Polysubstance abuse [F19.10] 07/17/2016  . Tardive dyskinesia [G24.01] 07/16/2016  . Tobacco use disorder [F17.200] 07/07/2016  . Dyslipidemia [E78.5] 07/07/2016  . Asthma [J45.909] 07/07/2016  . HTN (hypertension) [I10] 07/06/2016  . Diabetes (HCC) [E11.9] 12/25/2010   Subjective Data: Please see H&P.   Continued Clinical Symptoms:  Alcohol Use Disorder Identification Test Final Score (AUDIT): 0 The "Alcohol Use Disorders Identification Test", Guidelines for Use in Primary Care, Second Edition.  World Science writer [facility]). Score between 0-7:  no or low risk or alcohol related problems. Score between 8-15:  moderate risk of alcohol related problems. Score between 16-19:  high risk of alcohol related problems. Score 20 or above:  warrants further diagnostic evaluation for alcohol dependence and treatment.   CLINICAL FACTORS:   Currently Psychotic Previous Psychiatric Diagnoses and Treatments Medical Diagnoses and Treatments/Surgeries   Musculoskeletal: Strength & Muscle Tone: within normal limits Gait & Station: normal Patient leans: [name]  Psychiatric Specialty Exam: Physical Exam  ROS  Blood pressure 116/81, pulse (!) 115, temperature 99.2 F (37.3 C), temperature source Oral, resp. rate 16, height 6' (1.829 m), weight 76.7 kg (169 lb).Body mass index is 22.92 kg/m.   Please see H&P for MSE.   COGNITIVE FEATURES THAT CONTRIBUTE TO RISK:  Closed-mindedness,  Polarized thinking and Thought constriction (tunnel vision)    SUICIDE RISK:   Moderate:  Frequent suicidal ideation with limited intensity, and duration, some specificity in terms of plans, no associated intent, good self-control, limited dysphoria/symptomatology, some risk factors present, and identifiable protective factors, including available and accessible social support.  PLAN OF CARE: Please see H&P.   I certify that inpatient services furnished can reasonably be expected to improve the patient's condition.   Jomarie Longs, MD 02/15/2017, 2:02 PM

## 2017-02-15 NOTE — Progress Notes (Signed)
Nursing Note 02/15/2017 [id number]  Data Reports sleeping well.  Did not complete self-inventory. Affect flat.  Very lethargic this AM, confused, and disorganized.  Almost fell (see other note for interventions and response.)  Was pulse orthostatic this AM.  BP meds in AM held and fluids pushed.  After breakfast and fluids patient began responding appropriately. Denied HI, SI, AVH.   Smelled of urine this AM but improved after shower this afternoon (needed a lot of prompting).  Slept a lot today.  Action Spoke with patient 1:1, nurse offered support to patient throughout shift.  Patient needed a lot of support this AM (see other note re: lethargy.)  Encouraged to come out of room multiple times.  Continues to be monitored on 15 minute checks for safety.  Response In dayroom in evening after dinner, patient more alert in afternoon.  Staff to continue monitoring.

## 2017-02-15 NOTE — Progress Notes (Signed)
Did not attend group 

## 2017-02-15 NOTE — Progress Notes (Signed)
Recreation Therapy Notes  Patient admitted to unit 02/14/17. Due to admission within last year, no new assessment conducted at this time. Last assessment conducted 12/28/16. Patient reports no changes in stressors from previous admission. Patient reports not knowing why he is here.  Patient denies HI, AVH at this time but endorses SI.  Patient rated his SI a six and contracts for safely. Patient reports goal of trying to get better.   Information found below from assessment conducted 02/15/17.  Coping Skills: Family, Friends, Work  Engineer, building services: Anger, Communication, Concentration, Decision-Making, Expressing Yourself, Problem-Solving, Relationships, Self-Esteem/Confidence, Social Interaction, Stress Management, Time Management, Trusting Others  Leisure Interests: Games-Video Games   [place], LRT/CTRS          [name] A 02/15/2017 2:41 PM

## 2017-02-15 NOTE — [facility] Counselor (Signed)
Adult Comprehensive Assessment  Patient ID: Luisantonio Adinolfi, male   DOB: [birth year], 41 y.o.   MRN: [id number]   Information Source: Information source: Patient  Current Stressors:  Educational / Learning stressors: n/a Employment / Job issues: Pt is on disability. Family Relationships: Poor Financial / Lack of resources Fixed income Housing / Lack of housing: Pt reports he cannot return home to mother. He would like to go to an ALF.  Physical health (include injuries &life threatening diseases): diabetes, hypertension, dyslipidemia, asthma, and tardive dyskinesia.  Social relationships: n/a Substance abuse: n/a Bereavement / Loss: n/a   Living/Environment/Situation:  Living Arrangements: Parents  Living conditions (as described by patient or guardian): "I can't go back." He would not elaborate as to why he could not return.  How long has patient lived in current situation?: 4 days  What is atmosphere in current home: Temporary   Family History:  Marital status: Single Are you sexually active?: Yes What is your sexual orientation?: heterosexual Has your sexual activity been affected by drugs, alcohol, medication, or emotional stress?: none Does patient have children?: No  Childhood History:  By whom was/is the patient raised?: Both parents Additional childhood history information: Pt reports having a good childhood. No issues reported.  Description of patient's relationship with caregiver when they were a child: Pt reports being closer to his father.  Patient's description of current relationship with people who raised him/her: Pt sees his parents weekly. Pt reports he gets along with his parents. How were you disciplined when you got in trouble as a child/adolescent?: appropriate discipline Does patient have siblings?: Yes Number of Siblings: 3 Description of patient's current relationship with siblings: 2 brothers, 1 sister - Pt reports being close to 1 brother and his  sister.  Did patient suffer any verbal/emotional/physical/sexual abuse as a child?: No Did patient suffer from severe childhood neglect?: No Has patient ever been sexually abused/assaulted/raped as an adolescent or adult?: No Was the patient ever a victim of a crime or a disaster?: No Witnessed domestic violence?: No Has patient been effected by domestic violence as an adult?: No  Education:  Highest grade of school patient has completed: 11 Currently a Consulting civil engineer?: No Learning disability?: (unsure)  Employment/Work Situation:  Employment situation: On disability Why is patient on disability: mental health How long has patient been on disability: "all my life" Patient's job has been impacted by current illness: No What is the longest time patient has a held a job?: [employer] Where was the patient employed at that time?: 8-11 years Has patient ever been in the [employer]?: No Are There Guns or Other Weapons in Your Home?: No  Financial Resources:  Surveyor, quantity resources: Writer Does patient have a Lawyer or guardian?: No  Alcohol/Substance Abuse:  What has been your use of drugs/alcohol within the last 12 months?: Pt denies any alcohol or drug use If attempted suicide, did drugs/alcohol play a role in this?: No Alcohol/Substance Abuse Treatment Hx: Denies past history Has alcohol/substance abuse ever caused legal problems?: No  Social Support System: Conservation officer, nature Support System: Fair Museum/gallery exhibitions officer System: mom Type of faith/religion: Scientist, research (life sciences):  Leisure and Hobbies: video games  Strengths/Needs:  What things does the patient do well?: Friendly, resilient In what areas does patient struggle / problems for patient: depression, passive suicidal thoughts.   Discharge Plan:  Does patient have access to transportation?: Yes Will patient be returning to same living situation after discharge?: No,  he states he  cannot return.  Currently receiving community mental health services: No  Does patient have financial barriers related to discharge medications?: No    Summary/Recommendations:   Summary and Recommendations (to be completed by the evaluator): Izael is a 41 YO Hispanic male diagnosed with Schizoaffective D/O. He was discharged from [facility] to his mother's home on 9/19, and reported to [facility] on 9/23, reporting SI and HI. Ashaz reports that he became more and more depressed and started hearing mean voices, resulting in thoughts to hurt his mother, which scared him. Pt unable to elaborate what exactly the voices tell him.  Since May 2 of this year, a period of about 5 months, Amado has been hospitalized all but 23 days. He has been at both [facility] and [facility], on the [facility] wiatl list while in the ED, and has discharged to several different group homes and to his mother's home.  None of this has resulted in significant stabilization.  [facility] seeking other placement options.  In the meantime, Muaaz can benefit from crises stabilization, medication management, therapeutic milieu and referral to outpt provider.  Ida Rogue. 02/15/2017

## 2017-02-15 NOTE — Plan of Care (Signed)
Problem: Medication: Goal: Compliance with prescribed medication regimen will improve Outcome: Progressing Virat has been taking his medications as ordered today.  We will continue to monitor the progress towards his goals.

## 2017-02-15 NOTE — Progress Notes (Signed)
Recreation Therapy Notes  Date: 02/15/17 Time: 1000 Location: [address]  Group Topic: Coping Skills  Goal Area(s) Addresses:  Patients will be able to identify positive coping skills. Patients will be able to identify the benefits of positive coping skills. Patients will be able to identify benefits of using coping skills post d/c.  Intervention: Worksheet, pencils  Activity: Mind map.  Patients were given a blank mindmap.  Patients and LRT filled out the first 8 boxes together.  Patients identified mental health, stress, fear, loss of loved one, relationships, relapse/addiction, work and finances as situations they would need coping skills for.  Patients were to then identify 3 three coping skills for each of the areas identified.  Education: Pharmacologist, Building control surveyor.   Education Outcome: Acknowledges understanding/In group clarification offered/Needs additional education.   Clinical Observations/Feedback: Pt did not attend group.    [name], LRT/CTRS         [name] A 02/15/2017 12:07 PM

## 2017-02-16 LAB — GLUCOSE, CAPILLARY
Glucose-Capillary: 154 mg/dL — ABNORMAL HIGH (ref 65–99)
Glucose-Capillary: 245 mg/dL — ABNORMAL HIGH (ref 65–99)
Glucose-Capillary: 400 mg/dL — ABNORMAL HIGH (ref 65–99)
Glucose-Capillary: 246 mg/dL — ABNORMAL HIGH (ref 65–99)

## 2017-02-16 MED ORDER — CLOZAPINE 25 MG PO TABS
25.0000 mg | ORAL_TABLET | Freq: Every day | ORAL | Status: DC
  Administered 2017-02-17 – 2017-02-21 (×5): 25 mg via ORAL
  Filled 2017-02-16 (×7): qty 1

## 2017-02-16 NOTE — Progress Notes (Signed)
Recreation Therapy Notes  Animal-Assisted Activity (AAA) Program Checklist/Progress Notes Patient Eligibility Criteria Checklist & Daily Group note for Rec Tx Intervention  Date: 09.28.2018 Time: 2:45pm Location: [address]    AAA/T Program Assumption of Risk Form signed by Patient/ or Parent Legal Guardian Yes  Patient is free of allergies or sever asthma Yes  Patient reports no fear of animals Yes  Patient reports no history of cruelty to animals Yes  Patient understands his/her participation is voluntary Yes  Behavioral Response: Did not attend    Clinical Observations/Feedback: Patient discussed with MD for appropriateness in pet therapy session. Both LRT and MD agree patient is appropriate for participation. Patient offered participation in session, however declined invitation to participate in session.   Marykay Lex Margarine Grosshans, LRT/CTRS        Ainsley Deakins L 02/16/2017 3:15 PM

## 2017-02-16 NOTE — Progress Notes (Signed)
Recreation Therapy Notes  Date: 02/16/17 Time: 1000 Location: [address] Day[address]  Group Topic: Wellness  Goal Area(s) Addresses:  Patient will define components of whole wellness. Patient will verbalize benefit of whole wellness.  Intervention:  Chairs, 2 small beach balls     Activity: Group Juggle.  Patients were seated in a circle.  Patients were to toss the ball around to each other.  Once patients were able to get the first ball going, LRT would add second ball to the rotation.  Patients were to keep the balls going.  Patients could bounce the balls off the floor but the balls could not stop.  If one of the balls stopped, the person closest to it would throw it back into play.  If both balls stopped, play would start over with one ball.  Education: Wellness, Building control surveyor.   Education Outcome: Acknowledges education/In group clarification offered/Needs additional education.   Clinical Observations/Feedback: Pt did not attend group.   [name], LRT/CTRS         [name], Ridley Dileo A 02/16/2017 11:50 AM

## 2017-02-16 NOTE — Progress Notes (Signed)
[facility] MD Progress Note  02/16/2017 3:09 PM Ikaika Showers  MRN:  [id number] Subjective:  Patient states " I am depressed.'  Objective:Patient seen and chart reviewed.Discussed patient with treatment team. Pt today seen as depressed, withdrawn . Pt continues to be limited in milieu. Responds to questions are minimal. Pt continues to have AH. Pt reports SI , contracts for safety on the unit. Tolerating medications so far. Continue to support.     Principal Problem: Schizoaffective disorder, depressive type (HCC) Diagnosis:   Patient Active Problem List   Diagnosis Date Noted  . 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 Schizoaffective disorder, depressive type (HCC) [F25.1] 09/23/2016  . Polysubstance abuse [F19.10] 07/17/2016  . Tardive dyskinesia [G24.01] 07/16/2016  . Tobacco use disorder [F17.200] 07/07/2016  . Dyslipidemia [E78.5] 07/07/2016  . Asthma [J45.909] 07/07/2016  . HTN (hypertension) [I10] 07/06/2016  . Diabetes (HCC) [E11.9] 12/25/2010   Total Time spent with patient: 25 minutes  Past Psychiatric History: Please see H&P.   Past Medical History:  Past Medical History:  Diagnosis Date  . Anxiety   . Asthma   . Diabetes mellitus   . High blood pressure   . Sinus complaint    History reviewed. No pertinent surgical history. Family History:Please see H&P.  Family Psychiatric  History: Please see H&P.  Social History: Please see H&P.  History  Alcohol Use No     History  Drug Use No    Social History   Social History  . Marital status: Single    Spouse name: [name]  . Number of children: [name]  . Years of education: [name]   Social History Main Topics  . Smoking status: Current Every Day Smoker    Packs/day: 0.50    Types: Cigarettes  . Smokeless tobacco: Never Used  . Alcohol use No  . Drug use: No  . Sexual activity: Not Currently   Other Topics Concern  . None   Social History Narrative  . None   Additional Social  History:    Pain Medications: See MAR Prescriptions: See MAR Over the Counter: See MAR History of alcohol / drug use?: No history of alcohol / drug abuse                    Sleep: Fair  Appetite:  improving  Current Medications: Current Facility-Administered Medications  Medication Dose Route Frequency Provider Last Rate Last Dose  . acetaminophen (TYLENOL) tablet 650 mg  650 mg Oral Q6H PRN Nira Conn A, NP      . alum & mag hydroxide-simeth (MAALOX/MYLANTA) 200-200-20 MG/5ML suspension 30 mL  30 mL Oral Q4H PRN Nira Conn A, NP      . cloZAPine (CLOZARIL) tablet 150 mg  150 mg Oral QHS Nira Conn A, NP   150 mg at 02/15/17 2139  . [START ON 02/17/2017] cloZAPine (CLOZARIL) tablet 25 mg  25 mg Oral Daily Almando Brawley, MD      . FLUoxetine (PROZAC) capsule 40 mg  40 mg Oral Daily Nira Conn A, NP   40 mg at 02/16/17 0818  . insulin aspart (novoLOG) injection 0-15 Units  0-15 Units Subcutaneous TID WC Jomarie Longs, MD   5 Units at 02/16/17 1157  . insulin aspart (novoLOG) injection 0-5 Units  0-5 Units Subcutaneous QHS Jomarie Longs, MD   5 Units at 02/15/17 2140  . insulin aspart (novoLOG) injection 5 Units  5 Units Subcutaneous TID WC Jackelyn Poling, NP   5 Units at  02/16/17 1158  . insulin glargine (LANTUS) injection 40 Units  40 Units Subcutaneous QHS Nira Conn A, NP   40 Units at 02/15/17 2140  . lisinopril (PRINIVIL,ZESTRIL) tablet 20 mg  20 mg Oral Daily Nira Conn A, NP   20 mg at 02/16/17 0818  . magnesium hydroxide (MILK OF MAGNESIA) suspension 30 mL  30 mL Oral Daily PRN Nira Conn A, NP      . metFORMIN (GLUCOPHAGE) tablet 1,000 mg  1,000 mg Oral BID WC Nira Conn A, NP   1,000 mg at 02/16/17 0817  . montelukast (SINGULAIR) tablet 10 mg  10 mg Oral QHS Nira Conn A, NP   10 mg at 02/15/17 2139  . nicotine (NICODERM CQ - dosed in mg/24 hours) patch 21 mg  21 mg Transdermal Daily Arin Peral, MD      . OLANZapine zydis (ZYPREXA)  disintegrating tablet 5 mg  5 mg Oral TID PRN Jomarie Longs, MD       Or  . OLANZapine (ZYPREXA) injection 5 mg  5 mg Intramuscular TID PRN Azarius Lambson, MD      . pantoprazole (PROTONIX) EC tablet 40 mg  40 mg Oral Daily Nira Conn A, NP   40 mg at 02/16/17 0817  . propranolol (INDERAL) tablet 20 mg  20 mg Oral BID Nira Conn A, NP   20 mg at 02/16/17 0817  . traZODone (DESYREL) tablet 50 mg  50 mg Oral QHS,MR X 1 Nira Conn A, NP   50 mg at 02/15/17 2316    Lab Results:  Results for orders placed or performed during the hospital encounter of 02/14/17 (from the past 48 hour(s))  Glucose, capillary     Status: Abnormal   Collection Time: 02/14/17  9:06 PM  Result Value Ref Range   Glucose-Capillary 324 (H) 65 - 99 mg/dL   Comment 1 Notify RN    Comment 2 Document in Chart   Glucose, capillary     Status: Abnormal   Collection Time: 02/15/17  6:37 AM  Result Value Ref Range   Glucose-Capillary 310 (H) 65 - 99 mg/dL  Glucose, capillary     Status: Abnormal   Collection Time: 02/15/17  9:25 AM  Result Value Ref Range   Glucose-Capillary 151 (H) 65 - 99 mg/dL  Glucose, capillary     Status: Abnormal   Collection Time: 02/15/17 11:55 AM  Result Value Ref Range   Glucose-Capillary 125 (H) 65 - 99 mg/dL  Glucose, capillary     Status: Abnormal   Collection Time: 02/15/17  5:08 PM  Result Value Ref Range   Glucose-Capillary 188 (H) 65 - 99 mg/dL  Glucose, capillary     Status: Abnormal   Collection Time: 02/15/17  9:29 PM  Result Value Ref Range   Glucose-Capillary 375 (H) 65 - 99 mg/dL  Glucose, capillary     Status: Abnormal   Collection Time: 02/16/17  6:29 AM  Result Value Ref Range   Glucose-Capillary 154 (H) 65 - 99 mg/dL  Glucose, capillary     Status: Abnormal   Collection Time: 02/16/17 11:46 AM  Result Value Ref Range   Glucose-Capillary 245 (H) 65 - 99 mg/dL    Blood Alcohol level:  Lab Results  Component Value Date   ETH <5 01/16/2017   ETH <5  12/21/2016    Metabolic Disorder Labs: Lab Results  Component Value Date   HGBA1C 7.6 (H) 01/19/2017   MPG 171.42 01/19/2017   MPG 249 11/09/2016  Lab Results  Component Value Date   PROLACTIN 24.5 (H) 09/24/2016   PROLACTIN 3.4 (L) 07/07/2016   Lab Results  Component Value Date   CHOL 122 01/19/2017   TRIG 154 (H) 01/19/2017   HDL 36 (L) 01/19/2017   CHOLHDL 3.4 01/19/2017   VLDL 31 01/19/2017   LDLCALC 55 01/19/2017   LDLCALC 59 12/28/2016    Physical Findings: AIMS: Facial and Oral Movements Muscles of Facial Expression: None, normal Lips and Perioral Area: None, normal Jaw: None, normal Tongue: None, normal,Extremity Movements Upper (arms, wrists, hands, fingers): Mild Lower (legs, knees, ankles, toes): None, normal, Trunk Movements Neck, shoulders, hips: None, normal, Overall Severity Severity of abnormal movements (highest score from questions above): None, normal Incapacitation due to abnormal movements: None, normal Patient's awareness of abnormal movements (rate only patient's report): No Awareness, Dental Status Current problems with teeth and/or dentures?: No Does patient usually wear dentures?: No  CIWA:  CIWA-Ar Total: 4 COWS:     Musculoskeletal: Strength & Muscle Tone: within normal limits Gait & Station: normal Patient leans: [name]  Psychiatric Specialty Exam: Physical Exam  Nursing note and vitals reviewed.   Review of Systems  Psychiatric/Behavioral: Positive for depression and hallucinations. The patient is nervous/anxious.   All other systems reviewed and are negative.   Blood pressure 121/82, pulse 86, temperature 98.6 F (37 C), resp. rate 20, height 6' (1.829 m), weight 76.7 kg (169 lb).Body mass index is 22.92 kg/m.  General Appearance: Guarded  Eye Contact:  Minimal  Speech:  Slow  Volume:  Decreased  Mood:  Dysphoric  Affect:  Constricted  Thought Process:  Goal Directed and Descriptions of Associations: Circumstantial   Orientation:  Other:  self, situation  Thought Content:  Delusions, Hallucinations: Auditory, Paranoid Ideation and Rumination- unable to elaborate , however looks guarded  Suicidal Thoughts:  Yes.  without intent/plan, contracts for safety on the unit  Homicidal Thoughts:  Yes.  without intent/plan reports towards anyone  Memory:  Immediate;   Fair Recent;   Fair Remote;   Fair  Judgement:  Impaired  Insight:  Shallow  Psychomotor Activity:  Decreased  Concentration:  Concentration: Fair and Attention Span: Fair  Recall:  [employer] of Knowledge:  Fair  Language:  Fair  Akathisia:  No  Handed:  Right  AIMS (if indicated):     Assets:  Desire for Improvement  ADL's:  Impaired  Cognition:  Impaired,  Mild  Sleep:  Number of Hours: 4.75     Treatment Plan Summary:Patient with schizoaffective do presented as disorganized , responding to internal stimuli as well as with thought blocking and SI/HI. Patient continues to have psychosis , has memory issues - likely due to psychosis as well as S/P ect treatment ,will continue treatment. Daily contact with patient to assess and evaluate symptoms and progress in treatment, Medication management and Plan see below  Increase Clozaril to 25 mg po daily and 150 mg po qhs for psychosis. Prozac 40 mg po daily for affective sx. Propranolol 20 mg po bid for anxiety, tachycardia. Trazodone 50 mg po qhs prn for insomnia. Pharmacy consult for clozaril therapy. Repeat BMP for hx of hyponatremia. Consulted Dr.Clapac regarding ECT treatments - per Dr.Clapac - pt likely will not be a good candidate at this time. Will refer to [facility] for further management. Continue to support. CSW will continue to work on disposition.    Jomarie Longs, MD 02/16/2017, 3:09 PM

## 2017-02-16 NOTE — [facility] Group Notes (Signed)
[facility] LCSW Group Therapy  02/16/2017 1:43 PM   Type of Therapy:  Group Therapy  Participation Level:  Active  Participation Quality:  Attentive  Affect:  Appropriate  Cognitive:  Appropriate  Insight:  Improving  Engagement in Therapy:  Engaged  Modes of Intervention:  Clarification, Education, Exploration and Socialization  Summary of Progress/Problems: Today's group focused on resilience.  Invited.  Chose to not attend.  [name] 02/16/2017 , 1:43 PM

## 2017-02-16 NOTE — Progress Notes (Signed)
Nursing Note 02/16/2017 [id number]  Data Reports sleeping poorly without PRN sleep med.  Rates depression (blank)/10, hopelessness 8/10, and anxiety 7/10. Affect blunted.  Denies HI, SI but endorses stable AVH. Patient minimal and withdrawn.  Seen out of room more today than yesterday.  Leaving unit for all meals.     Action Spoke with patient 1:1, nurse offered support to patient throughout shift.  Continues to be monitored on 15 minute checks for safety.  Response Remains safe, seen more outside of room today.  Improved from yesterday.

## 2017-02-16 NOTE — Tx Team (Signed)
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 acetaminophen (TYLENOL) tablet 650 mg  650 mg Oral Q6H PRN Lindon Romp A, NP      . alum & mag hydroxide-simeth (MAALOX/MYLANTA) 200-200-20 MG/5ML suspension 30 mL  30 mL Oral Q4H PRN Lindon Romp A, NP      . cloZAPine (CLOZARIL) tablet 150 mg  150 mg Oral QHS Lindon Romp A, NP   150 mg at 02/15/17 2139  . FLUoxetine (PROZAC) capsule 40 mg  40 mg Oral Daily Lindon Romp A, NP   40 mg at 02/16/17 0818  . insulin aspart (novoLOG) injection 0-15 Units  0-15 Units Subcutaneous TID WC Ursula Alert, MD   5 Units at 02/16/17 1157  . insulin aspart (novoLOG) injection 0-5 Units  0-5 Units Subcutaneous QHS Ursula Alert, MD   5 Units at 02/15/17 2140  . insulin aspart (novoLOG) injection 5 Units  5 Units Subcutaneous TID WC Lindon Romp A, NP   5 Units at 02/16/17 1158  . insulin glargine (LANTUS) injection 40 Units  40 Units Subcutaneous QHS Lindon Romp A, NP   40 Units at 02/15/17 2140  . lisinopril (PRINIVIL,ZESTRIL) tablet 20 mg  20 mg Oral Daily Lindon Romp A, NP   20 mg at 02/16/17 0818  . magnesium hydroxide (MILK OF MAGNESIA) suspension 30 mL  30 mL Oral Daily PRN Lindon Romp A, NP      . metFORMIN (GLUCOPHAGE) tablet 1,000 mg  1,000 mg Oral BID WC Lindon Romp A, NP   1,000 mg at 02/16/17 0817  . montelukast (SINGULAIR) tablet 10 mg  10 mg Oral QHS Lindon Romp A, NP   10 mg at 02/15/17 2139  . nicotine (NICODERM CQ - dosed in mg/24 hours) patch 21 mg  21 mg Transdermal Daily Eappen, Saramma, MD       . OLANZapine zydis (ZYPREXA) disintegrating tablet 5 mg  5 mg Oral TID PRN Ursula Alert, MD       Or  . OLANZapine (ZYPREXA) injection 5 mg  5 mg Intramuscular TID PRN Eappen, Saramma, MD      . pantoprazole (PROTONIX) EC tablet 40 mg  40 mg Oral Daily Lindon Romp A, NP   40 mg at 02/16/17 0817  . propranolol (INDERAL) tablet 20 mg  20 mg Oral BID Lindon Romp A, NP   20 mg at 02/16/17 0817  . traZODone (DESYREL) tablet 50 mg  50 mg Oral QHS,MR X 1 Lindon Romp A, NP   50 mg at 02/15/17 2316    PTA Medications: Prescriptions Prior to Admission  Medication Sig Dispense Refill Last Dose  . amantadine (SYMMETREL) 100 MG capsule Take 100 mg by mouth 2 (two) times daily.   02/11/2017  . citalopram (CELEXA) 20 MG tablet Take 20 mg by mouth daily.   02/11/2017  . cloZAPine (CLOZARIL) 50 MG tablet Take 3 tablets (150 mg total) by mouth at bedtime. 90 tablet 1 02/11/2017  . FLUoxetine (PROZAC) 40 MG capsule Take 1 capsule (40 mg total) by mouth daily. 30 capsule 1 02/11/2017  . insulin aspart (NOVOLOG) 100 UNIT/ML injection Inject 5 Units into the skin 3 (  three) times daily with meals. (Patient taking differently: Inject 6 Units into the skin 3 (three) times daily with meals. ) 10 mL 2 02/11/2017  . insulin glargine (LANTUS) 100 UNIT/ML injection Inject 0.4 mLs (40 Units total) into the skin at bedtime. (Patient taking differently: Inject 45 Units into the skin at bedtime. ) 10 mL 2 02/11/2017  . lisinopril (PRINIVIL,ZESTRIL) 20 MG tablet Take 1 tablet (20 mg total) by mouth daily. 30 tablet 1 02/11/2017  . metFORMIN (GLUCOPHAGE) 1000 MG tablet Take 1 tablet (1,000 mg total) by mouth 2 (two) times daily with a meal. 60 tablet 1 02/11/2017  . pantoprazole (PROTONIX) 40 MG tablet Take 1 tablet (40 mg total) by mouth daily. 30 tablet 1 02/11/2017  . propranolol (INDERAL) 20 MG tablet Take 1 tablet (20 mg total) by mouth 2 (two) times daily. 60 tablet 1 02/11/2017  . simvastatin (ZOCOR) 20 MG tablet Take 20 mg  by mouth daily.   02/11/2017  . temazepam (RESTORIL) 7.5 MG capsule Take 7.5 mg by mouth at bedtime.   02/11/2017  . montelukast (SINGULAIR) 10 MG tablet Take 1 tablet (10 mg total) by mouth at bedtime. (Patient not taking: Reported on 02/15/2017) 30 tablet 1 Not Taking at Unknown time    Patient Stressors: Marital or family conflict Other: blood sugar  Patient Strengths: General fund of knowledge Motivation for treatment/growth  Treatment Modalities: Medication Management, Group therapy, Case management,  1 to 1 session with clinician, Psychoeducation, Recreational therapy.   Physician Treatment Plan for Primary Diagnosis: Schizoaffective disorder, depressive type ([place]) Long Term Goal(s): Improvement in symptoms so as ready for discharge  Short Term Goals: Ability to verbalize feelings will improve Ability to demonstrate self-control will improve Compliance with prescribed medications will improve Ability to verbalize feelings will improve Ability to identify and develop effective coping behaviors will improve Compliance with prescribed medications will improve  Medication Management: Evaluate patient's response, side effects, and tolerance of medication regimen.  Therapeutic Interventions: 1 to 1 sessions, Unit Group sessions and Medication administration.  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  Therapeutic Interventions: 1 to 1 sessions, Unit Group sessions and Medication administration.  Evaluation of Outcomes: Progressing   RN Treatment Plan for Primary Diagnosis: Schizoaffective disorder, depressive type ([place]) Long Term Goal(s): Knowledge of disease and therapeutic regimen to maintain health will improve  Short Term Goals: Ability to disclose and discuss suicidal ideas and Ability to identify and develop effective coping behaviors will improve  Medication Management: RN will administer medications as ordered by provider, will assess and evaluate patient's response and provide education to patient for prescribed medication. RN will report any adverse and/or side effects to prescribing provider.  Therapeutic Interventions: 1 on 1 counseling sessions, Psychoeducation, Medication administration, Evaluate responses to treatment, Monitor vital signs and CBGs as ordered, Perform/monitor CIWA, COWS, AIMS and Fall Risk screenings as ordered, Perform wound care treatments as ordered.  Evaluation of Outcomes: Progressing  LCSW Treatment Plan for Primary Diagnosis: Schizoaffective disorder, depressive type ([place]) Long Term Goal(s): Safe transition to appropriate next level of care at discharge, Engage patient in therapeutic group addressing interpersonal concerns.  Short Term Goals: Engage patient in aftercare planning with referrals and resources  Therapeutic Interventions: Assess for all discharge needs, 1 to 1 time with Social worker, Explore available resources and support systems, Assess for adequacy in community support network, Educate family and significant other(s) on suicide prevention, Complete Psychosocial Assessment, Interpersonal group therapy.  Evaluation of Outcomes: Not Met  Referring pt to both group home and [facility]   Progress in Treatment: Attending  groups:No Participating in groups: No Taking medication as prescribed: Yes Toleration medication: Yes, no side effects reported at this time Family/Significant other contact made: No Patient understands diagnosis: No Limited insight Discussing patient identified problems/goals with staff: Yes Medical problems stabilized or resolved: Yes Denies suicidal/homicidal ideation: Yes Issues/concerns per patient self-inventory: None Other: [name]  New problem(s) identified: None identified at this time.   New Short Term/Long Term Goal(s): Chose to not participate in treatment team process.  Discharge Plan or Barriers:   Reason for Continuation of Hospitalization:  Homicidal ideation  Medication stabilization Suicidal ideation   Estimated Length of Stay: 9/28  Attendees: Patient: 02/16/2017  1:47 PM  Physician: Ursula Alert, MD 02/16/2017  1:47 PM  Nursing: Sena Hitch, RN 02/16/2017  1:47 PM  RN Care Manager: Lars Pinks, RN 02/16/2017  1:47 PM  Social Worker: Ripley Fraise 02/16/2017  1:47 PM  Recreational Therapist: Winfield Cunas 02/16/2017  1:47 PM  Other: Norberto Sorenson 02/16/2017  1:47 PM  Other:  02/16/2017  1:47 PM    Scribe for Treatment Team:  Roque Lias LCSW 02/16/2017 1:47 PM

## 2017-02-16 NOTE — Progress Notes (Signed)
D:  Tony Cunningham has been in his room much of the evening.  He did not attend evening group.  He continues to report hearing voices and having passive SI.  He does agree to contract for safety on the unit.  His blood sugar was elevated at HS (375) and 5 units novolog coverage given.  He denies any pain or discomfort and appears to be in no physical distress.  He denied HI or visual hallucinations.   A:  1:1 interaction with RN for support and encouragement.  Medications as ordered.  Q 15 checks maintained for safety.  Encouraged fluids.  R:  Tony Cunningham remains safe on the unit.  Encouraged participation in group and unit activities. Encouraged him to follow low carb diet/snacks.  We will continue to monitor the progress towards his goals.

## 2017-02-16 NOTE — Progress Notes (Signed)
D:  Tony Cunningham has been out of his room this evening.  He did attend groups.  He stated his day was "so so."  He continues to have some suicidal ideation but is able to contract for safety on the unit.  He reported not hearing voices today.  He denies any pain or discomfort and appears to be in no physical distress.  He took his medication without difficulty.  He did voice that he had trouble sleeping last night and is hoping that he will sleep better tonight.  A:  1:1 interaction for support and encouragement.  Medications as ordered.  Q 15 minute checks maintained for safety.  Encouraged participation in group and unit activities. R:  Tony Cunningham remains safe on the unit.  We will continue to monitor the progress towards his goals.

## 2017-02-16 NOTE — Plan of Care (Signed)
Problem: Medication: Goal: Compliance with prescribed medication regimen will improve Outcome: Progressing He is taking his medications as prescribed.  He comes up to the med window with little prompting.  We will continue to monitor the progress towards his goals.

## 2017-02-16 NOTE — Progress Notes (Signed)
Adult Psychoeducational Group Note  Date:  02/16/2017 Time:  9:04 PM  Group Topic/Focus:  Wrap-Up Group:   The focus of this group is to help patients review their daily goal of treatment and discuss progress on daily workbooks.  Participation Level:  Active  Participation Quality:  Appropriate  Affect:  Appropriate  Cognitive:  Appropriate  Insight: Appropriate  Engagement in Group:  Engaged  Modes of Intervention:  Discussion  Additional Comments:  The patient expressed that he attend all groups.The patient also said that he rates today a 7.  Octavio Manns 02/16/2017, 9:04 PM

## 2017-02-17 LAB — CBC WITH DIFFERENTIAL/PLATELET
BASOS PCT: 1 %
Basophils Absolute: 0.1 10*3/uL (ref 0.0–0.1)
EOS ABS: 0.3 10*3/uL (ref 0.0–0.7)
Eosinophils Relative: 5 %
HEMATOCRIT: 39.4 % (ref 39.0–52.0)
HEMOGLOBIN: 13.7 g/dL (ref 13.0–17.0)
Lymphocytes Relative: 46 %
Lymphs Abs: 2.9 10*3/uL (ref 0.7–4.0)
MCH: 29.9 pg (ref 26.0–34.0)
MCHC: 34.8 g/dL (ref 30.0–36.0)
MCV: 86 fL (ref 78.0–100.0)
MONOS PCT: 5 %
Monocytes Absolute: 0.3 10*3/uL (ref 0.1–1.0)
NEUTROS ABS: 2.7 10*3/uL (ref 1.7–7.7)
NEUTROS PCT: 43 %
Platelets: 201 10*3/uL (ref 150–400)
RBC: 4.58 MIL/uL (ref 4.22–5.81)
RDW: 12.2 % (ref 11.5–15.5)
WBC: 6.3 10*3/uL (ref 4.0–10.5)

## 2017-02-17 LAB — GLUCOSE, CAPILLARY
GLUCOSE-CAPILLARY: 75 mg/dL (ref 65–99)
GLUCOSE-CAPILLARY: 222 mg/dL — AB (ref 65–99)
Glucose-Capillary: 162 mg/dL — ABNORMAL HIGH (ref 65–99)
Glucose-Capillary: 238 mg/dL — ABNORMAL HIGH (ref 65–99)

## 2017-02-17 LAB — BASIC METABOLIC PANEL
ANION GAP: 10 (ref 5–15)
BUN: 9 mg/dL (ref 6–20)
CHLORIDE: 104 mmol/L (ref 101–111)
CO2: 28 mmol/L (ref 22–32)
Calcium: 8.8 mg/dL — ABNORMAL LOW (ref 8.9–10.3)
Creatinine, Ser: 0.55 mg/dL — ABNORMAL LOW (ref 0.61–1.24)
GFR calc non Af Amer: >60 mL/min (ref >60)
Glucose, Bld: 170 mg/dL — ABNORMAL HIGH (ref 65–99)
POTASSIUM: 3.8 mmol/L (ref 3.5–5.1)
SODIUM: 142 mmol/L (ref 135–145)

## 2017-02-17 MED ORDER — TUBERCULIN PPD 5 UNIT/0.1ML ID SOLN
5.0000 [IU] | Freq: Once | INTRADERMAL | Status: AC
  Administered 2017-02-17: 5 [IU] via INTRADERMAL

## 2017-02-17 NOTE — Progress Notes (Signed)
Adult Psychoeducational Group Note  Date:  02/17/2017 Time:  8:25 PM  Group Topic/Focus:  Wrap-Up Group:   The focus of this group is to help patients review their daily goal of treatment and discuss progress on daily workbooks.  Participation Level:  Active  Participation Quality:  Appropriate  Affect:  Appropriate  Cognitive:  Appropriate  Insight: Appropriate  Engagement in Group:  Engaged  Modes of Intervention:  Discussion  Additional Comments: The patient expressed that he rates today a 8.The patient also said he did not attend group.  Octavio Manns 02/17/2017, 8:25 PM

## 2017-02-17 NOTE — [facility] Group Notes (Signed)
LCSW Group Therapy Note  02/17/2017 1:15pm  Type of Therapy/Topic:  Group Therapy:  Balance in Life  Participation Level:  Did Not Attend  Description of Group:    This group will address the concept of balance and how it feels and looks when one is unbalanced. Patients will be encouraged to process areas in their lives that are out of balance and identify reasons for remaining unbalanced. Facilitators will guide patients in utilizing problem-solving interventions to address and correct the stressor making their life unbalanced. Understanding and applying boundaries will be explored and addressed for obtaining and maintaining a balanced life. Patients will be encouraged to explore ways to assertively make their unbalanced needs known to significant others in their lives, using other group members and facilitator for support and feedback.  Therapeutic Goals: 1. Patient will identify two or more emotions or situations they have that consume much of in their lives. 2. Patient will identify signs/triggers that life has become out of balance:  3. Patient will identify two ways to set boundaries in order to achieve balance in their lives:  4. Patient will demonstrate ability to communicate their needs through discussion and/or role plays  Summary of Patient Progress:      Therapeutic Modalities:   Cognitive Behavioral Therapy Solution-Focused Therapy Assertiveness Training  Carlynn Herald Work 02/17/2017 3:51 PM

## 2017-02-17 NOTE — Progress Notes (Addendum)
[facility] MD Progress Note  02/17/2017 11:55 AM Tony Cunningham  MRN:  [id number] Subjective: Patient states " I am ok."   Objective:Patient seen and chart reviewed.Discussed patient with treatment team.  Pt seen as withdrawn, isolative , poor eye contact. Pt continues to have HI -does not discuss further. Pt denies SI today. Pt reports AH - unable to discuss what they say. Pt continues to have some thought blocking . He is oriented to self, situation and place. Pt continues to need encouragement to attend groups , as well as take care of his ADLS. He does ADLS with support from staff. Continue treatment.     Principal Problem: Schizoaffective disorder, depressive type ([place]) Diagnosis:   Patient Active Problem List   Diagnosis Date Noted  . 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 Schizoaffective disorder, depressive type ([place]) [F25.1] 09/23/2016  . Polysubstance abuse [F19.10] 07/17/2016  . Tardive dyskinesia [G24.01] 07/16/2016  . Tobacco use disorder [F17.200] 07/07/2016  . Dyslipidemia [E78.5] 07/07/2016  . Asthma [J45.909] 07/07/2016  . HTN (hypertension) [I10] 07/06/2016  . Diabetes ([place]) [E11.9] 12/25/2010   Total Time spent with patient: 20 minutes  Past Psychiatric History: Please see H&P.   Past Medical History:  Past Medical History:  Diagnosis Date  . Anxiety   . Asthma   . Diabetes mellitus   . High blood pressure   . Sinus complaint    History reviewed. No pertinent surgical history. Family History:Please see H&P.  Family Psychiatric  History: Please see H&P.  Social History: Please see H&P.  History  Alcohol Use No     History  Drug Use No    Social History   Social History  . Marital status: Single    Spouse name: [name]  . Number of children: [name]  . Years of education: [name]   Social History Main Topics  . Smoking status: Current Every Day Smoker    Packs/day: 0.50    Types: Cigarettes  . Smokeless tobacco: Never Used  .  Alcohol use No  . Drug use: No  . Sexual activity: Not Currently   Other Topics Concern  . None   Social History Narrative  . None   Additional Social History:    Pain Medications: See MAR Prescriptions: See MAR Over the Counter: See MAR History of alcohol / drug use?: No history of alcohol / drug abuse                    Sleep: Fair  Appetite:  improving  Current Medications: Current Facility-Administered Medications  Medication Dose Route Frequency Provider Last Rate Last Dose  . acetaminophen (TYLENOL) tablet 650 mg  650 mg Oral Q6H PRN Lindon Romp A, NP      . alum & mag hydroxide-simeth (MAALOX/MYLANTA) 200-200-20 MG/5ML suspension 30 mL  30 mL Oral Q4H PRN Lindon Romp A, NP      . cloZAPine (CLOZARIL) tablet 150 mg  150 mg Oral QHS Lindon Romp A, NP   150 mg at 02/16/17 2141  . cloZAPine (CLOZARIL) tablet 25 mg  25 mg Oral Daily Ursula Alert, MD   25 mg at 02/17/17 6659  . FLUoxetine (PROZAC) capsule 40 mg  40 mg Oral Daily Lindon Romp A, NP   40 mg at 02/17/17 9357  . insulin aspart (novoLOG) injection 0-15 Units  0-15 Units Subcutaneous TID WC Ursula Alert, MD   3 Units at 02/17/17 0651  . insulin aspart (novoLOG) injection 0-5 Units  0-5 Units Subcutaneous  Carrie Mew, MD   2 Units at 02/16/17 2138  . insulin aspart (novoLOG) injection 5 Units  5 Units Subcutaneous TID WC Lindon Romp A, NP   5 Units at 02/17/17 0650  . insulin glargine (LANTUS) injection 40 Units  40 Units Subcutaneous QHS Lindon Romp A, NP   40 Units at 02/16/17 2137  . lisinopril (PRINIVIL,ZESTRIL) tablet 20 mg  20 mg Oral Daily Lindon Romp A, NP   20 mg at 02/17/17 6237  . magnesium hydroxide (MILK OF MAGNESIA) suspension 30 mL  30 mL Oral Daily PRN Lindon Romp A, NP      . metFORMIN (GLUCOPHAGE) tablet 1,000 mg  1,000 mg Oral BID WC Lindon Romp A, NP   1,000 mg at 02/17/17 0821  . montelukast (SINGULAIR) tablet 10 mg  10 mg Oral QHS Lindon Romp A, NP   10 mg at  02/16/17 2141  . nicotine (NICODERM CQ - dosed in mg/24 hours) patch 21 mg  21 mg Transdermal Daily Katia Hannen, MD      . OLANZapine zydis (ZYPREXA) disintegrating tablet 5 mg  5 mg Oral TID PRN Ursula Alert, MD       Or  . OLANZapine (ZYPREXA) injection 5 mg  5 mg Intramuscular TID PRN Marguerite Barba, MD      . pantoprazole (PROTONIX) EC tablet 40 mg  40 mg Oral Daily Lindon Romp A, NP   40 mg at 02/17/17 6283  . propranolol (INDERAL) tablet 20 mg  20 mg Oral BID Lindon Romp A, NP   20 mg at 02/17/17 1517  . traZODone (DESYREL) tablet 50 mg  50 mg Oral QHS,MR X 1 Lindon Romp A, NP   50 mg at 02/16/17 2141    Lab Results:  Results for orders placed or performed during the hospital encounter of 02/14/17 (from the past 48 hour(s))  Glucose, capillary     Status: Abnormal   Collection Time: 02/15/17  5:08 PM  Result Value Ref Range   Glucose-Capillary 188 (H) 65 - 99 mg/dL  Glucose, capillary     Status: Abnormal   Collection Time: 02/15/17  9:29 PM  Result Value Ref Range   Glucose-Capillary 375 (H) 65 - 99 mg/dL  Glucose, capillary     Status: Abnormal   Collection Time: 02/16/17  6:29 AM  Result Value Ref Range   Glucose-Capillary 154 (H) 65 - 99 mg/dL  Glucose, capillary     Status: Abnormal   Collection Time: 02/16/17 11:46 AM  Result Value Ref Range   Glucose-Capillary 245 (H) 65 - 99 mg/dL  Glucose, capillary     Status: Abnormal   Collection Time: 02/16/17  4:55 PM  Result Value Ref Range   Glucose-Capillary 400 (H) 65 - 99 mg/dL  Glucose, capillary     Status: Abnormal   Collection Time: 02/16/17  8:39 PM  Result Value Ref Range   Glucose-Capillary 246 (H) 65 - 99 mg/dL  Glucose, capillary     Status: Abnormal   Collection Time: 02/17/17  6:34 AM  Result Value Ref Range   Glucose-Capillary 162 (H) 65 - 99 mg/dL  CBC with Differential/Platelet     Status: None   Collection Time: 02/17/17  6:42 AM  Result Value Ref Range   WBC 6.3 4.0 - 10.5 K/uL   RBC 4.58  4.22 - 5.81 MIL/uL   Hemoglobin 13.7 13.0 - 17.0 g/dL   HCT 39.4 39.0 - 52.0 %   MCV 86.0 78.0 - 100.0 fL  MCH 29.9 26.0 - 34.0 pg   MCHC 34.8 30.0 - 36.0 g/dL   RDW 12.2 11.5 - 15.5 %   Platelets 201 150 - 400 K/uL   Neutrophils Relative % 43 %   Neutro Abs 2.7 1.7 - 7.7 K/uL   Lymphocytes Relative 46 %   Lymphs Abs 2.9 0.7 - 4.0 K/uL   Monocytes Relative 5 %   Monocytes Absolute 0.3 0.1 - 1.0 K/uL   Eosinophils Relative 5 %   Eosinophils Absolute 0.3 0.0 - 0.7 K/uL   Basophils Relative 1 %   Basophils Absolute 0.1 0.0 - 0.1 K/uL    Comment: Performed at [facility], [place] [address], [place], [place] [address]  Basic metabolic panel     Status: Abnormal   Collection Time: 02/17/17  6:42 AM  Result Value Ref Range   Sodium 142 135 - 145 mmol/L   Potassium 3.8 3.5 - 5.1 mmol/L   Chloride 104 101 - 111 mmol/L   CO2 28 22 - 32 mmol/L   Glucose, Bld 170 (H) 65 - 99 mg/dL   BUN 9 6 - 20 mg/dL   Creatinine, Ser 0.55 (L) 0.61 - 1.24 mg/dL   Calcium 8.8 (L) 8.9 - 10.3 mg/dL   GFR calc non Af Amer >60 >60 mL/min   GFR calc Af Amer >60 >60 mL/min    Comment: (NOTE) The eGFR has been calculated using the CKD EPI equation. This calculation has not been validated in all clinical situations. eGFR's persistently <60 mL/min signify possible Chronic Kidney Disease.    Anion gap 10 5 - 15    Comment: Performed at [facility], [place] [address] [place], [place] [address]    Blood Alcohol level:  Lab Results  Component Value Date   [facility] <5 01/16/2017   ETH <5 29/79/8921    Metabolic Disorder Labs: Lab Results  Component Value Date   HGBA1C 7.6 (H) 01/19/2017   MPG 171.42 01/19/2017   MPG 249 11/09/2016   Lab Results  Component Value Date   PROLACTIN 24.5 (H) 09/24/2016   PROLACTIN 3.4 (L) 07/07/2016   Lab Results  Component Value Date   CHOL 122 01/19/2017   TRIG 154 (H) 01/19/2017   HDL 36 (L) 01/19/2017   CHOLHDL 3.4 01/19/2017    VLDL 31 01/19/2017   LDLCALC 55 01/19/2017   LDLCALC 59 12/28/2016    Physical Findings: AIMS: Facial and Oral Movements Muscles of Facial Expression: None, normal Lips and Perioral Area: None, normal Jaw: None, normal Tongue: None, normal,Extremity Movements Upper (arms, wrists, hands, fingers): Mild Lower (legs, knees, ankles, toes): None, normal, Trunk Movements Neck, shoulders, hips: None, normal, Overall Severity Severity of abnormal movements (highest score from questions above): Minimal Incapacitation due to abnormal movements: None, normal Patient's awareness of abnormal movements (rate only patient's report): No Awareness, Dental Status Current problems with teeth and/or dentures?: No Does patient usually wear dentures?: No  CIWA:  CIWA-Ar Total: 4 COWS:     Musculoskeletal: Strength & Muscle Tone: within normal limits Gait & Station: normal Patient leans: [name]  Psychiatric Specialty Exam: Physical Exam  Nursing note and vitals reviewed.   Review of Systems  Psychiatric/Behavioral: Positive for depression and hallucinations. The patient is nervous/anxious.   All other systems reviewed and are negative.   Blood pressure 127/80, pulse 81, temperature 98.6 F (37 C), temperature source Oral, resp. rate 20, height 6' (1.829 m), weight 76.7 kg (169 lb).Body mass index is 22.92 kg/m.  General Appearance: Guarded  Eye Contact:  Minimal  Speech:  Slow, thought blocking   Volume:  Decreased  Mood:  Dysphoric  Affect:  Constricted  Thought Process:  Goal Directed and Descriptions of Associations: Circumstantial  Orientation:  Other:  self, situation and place  Thought Content:  Delusions, Hallucinations: Auditory, Paranoid Ideation and Rumination unable to elaborate   Suicidal Thoughts:  No  Homicidal Thoughts:  Yes.  without intent/plan does not discuss further   Memory:  Immediate;   Fair Recent;   Fair Remote;   Poor  Judgement:  Impaired  Insight:  Shallow   Psychomotor Activity:  Decreased  Concentration:  Concentration: Fair and Attention Span: Fair  Recall:  [employer] of Knowledge:  Fair  Language:  Fair  Akathisia:  No  Handed:  Right  AIMS (if indicated):     Assets:  Desire for Improvement  ADL's:  Impaired  Cognition:  Impaired,  Mild  Sleep:  Number of Hours: 6    Will continue today 02/17/17  plan as below except where it is noted.  Treatment Plan Summary:Patient with schizoaffective do , continues to have thought blocking , negative sx as well as HI. Pt continues to need a lot of support to take care of his ADLs as well as his medications.   Pt will benefit from being in a supervised, structured place - since he has been in and out of long hospital stays since May 2018 .  He has failed multiple trials of medications as well as recent ECT treatments( this month).   Daily contact with patient to assess and evaluate symptoms and progress in treatment, Medication management and Plan see below  Increase Clozaril to 25 mg po daily and 150 mg po qhs for psychosis. CBC with diff as per Clozaril therapy needs - Pharmacy consult placed- Results for HEITH, HAIGLER (MRN [id number]) as of 02/17/2017 11:46  Ref. Range 02/17/2017 06:42  WBC Latest Ref Range: 4.0 - 10.5 K/uL 6.3  RBC Latest Ref Range: 4.22 - 5.81 MIL/uL 4.58  Hemoglobin Latest Ref Range: 13.0 - 17.0 g/dL 13.7  HCT Latest Ref Range: 39.0 - 52.0 % 39.4  MCV Latest Ref Range: 78.0 - 100.0 fL 86.0  MCH Latest Ref Range: 26.0 - 34.0 pg 29.9  MCHC Latest Ref Range: 30.0 - 36.0 g/dL 34.8  RDW Latest Ref Range: 11.5 - 15.5 % 12.2  Platelets Latest Ref Range: 150 - 400 K/uL 201   Prozac 40 mg po daily for affective sx. Propranolol 20 mg po bid for anxiety, tachycardia. Trazodone 50 mg po qhs prn for insomnia. Pharmacy consult for clozaril therapy. Repeat BMP for hx of hyponatremia - WNL. Consulted Dr.Clapac regarding ECT treatments - per Dr.Clapac - pt likely will not be a  good candidate at this time. Will refer to [facility] for further management. Will give PPD today 02/17/2017 for possible [facility] referral. Continue to support. CSW will continue to work on disposition.    Ursula Alert, MD 02/17/2017, 11:55 AM

## 2017-02-18 LAB — GLUCOSE, CAPILLARY
GLUCOSE-CAPILLARY: 179 mg/dL — AB (ref 65–99)
GLUCOSE-CAPILLARY: 179 mg/dL — AB (ref 65–99)
GLUCOSE-CAPILLARY: 305 mg/dL — AB (ref 65–99)
Glucose-Capillary: 324 mg/dL — ABNORMAL HIGH (ref 65–99)

## 2017-02-18 NOTE — Tx Team (Signed)
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 acetaminophen (TYLENOL) tablet 650 mg  650 mg Oral Q6H PRN Lindon Romp A, NP      . alum & mag hydroxide-simeth (MAALOX/MYLANTA) 200-200-20 MG/5ML suspension 30 mL  30 mL Oral Q4H PRN Lindon Romp A, NP      . cloZAPine (CLOZARIL) tablet 150 mg  150 mg Oral QHS Lindon Romp A, NP   150 mg at 02/17/17 2126  . cloZAPine (CLOZARIL) tablet 25 mg  25 mg Oral Daily Ursula Alert, MD   25 mg at 02/18/17 0837  . FLUoxetine (PROZAC) capsule 40 mg  40 mg Oral Daily Lindon Romp A, NP   40 mg at 02/18/17 0837  . insulin aspart (novoLOG) injection 0-15 Units  0-15 Units Subcutaneous TID WC Ursula Alert, MD   3 Units at 02/18/17 0729  . insulin aspart (novoLOG) injection 0-5 Units  0-5 Units Subcutaneous QHS Ursula Alert, MD   2 Units at 02/17/17 2127  . insulin aspart (novoLOG) injection 5 Units  5 Units Subcutaneous TID WC Lindon Romp A, NP   5 Units at 02/17/17 0650  . insulin glargine (LANTUS) injection 40 Units  40 Units Subcutaneous QHS Lindon Romp A, NP   40 Units at 02/17/17 2127  . lisinopril (PRINIVIL,ZESTRIL) tablet 20 mg  20 mg Oral Daily Lindon Romp A, NP   20 mg at 02/18/17 1478  . magnesium hydroxide (MILK OF MAGNESIA) suspension 30 mL  30 mL Oral Daily PRN Lindon Romp A, NP      . metFORMIN (GLUCOPHAGE) tablet 1,000 mg  1,000 mg Oral BID WC Lindon Romp A, NP   1,000 mg at 02/18/17 0836  . montelukast (SINGULAIR) tablet 10 mg  10 mg Oral QHS Lindon Romp A, NP   10 mg at 02/17/17 2126  .  nicotine (NICODERM CQ - dosed in mg/24 hours) patch 21 mg  21 mg Transdermal Daily Eappen, Saramma, MD      . OLANZapine zydis (ZYPREXA) disintegrating tablet 5 mg  5 mg Oral TID PRN Ursula Alert, MD       Or  . OLANZapine (ZYPREXA) injection 5 mg  5 mg Intramuscular TID PRN Eappen, Saramma, MD      . pantoprazole (PROTONIX) EC tablet 40 mg  40 mg Oral Daily Lindon Romp A, NP   40 mg at 02/18/17 0837  . propranolol (INDERAL) tablet 20 mg  20 mg Oral BID Lindon Romp A, NP   20 mg at 02/18/17 2956  . traZODone (DESYREL) tablet 50 mg  50 mg Oral QHS,MR X 1 Lindon Romp A, NP   50 mg at 02/17/17 2126  . tuberculin injection 5 Units  5 Units Intradermal Once Ursula Alert, MD   5 Units at 02/17/17 1402    PTA Medications: Prescriptions Prior to Admission  Medication Sig Dispense Refill Last Dose  . amantadine (SYMMETREL) 100 MG capsule Take 100 mg by mouth 2 (two) times daily.   02/11/2017  . citalopram (CELEXA) 20 MG tablet Take 20 mg by mouth daily.   02/11/2017  . cloZAPine (CLOZARIL) 50 MG tablet Take 3 tablets (150 mg total)  by mouth at bedtime. 90 tablet 1 02/11/2017  . FLUoxetine (PROZAC) 40 MG capsule Take 1 capsule (40 mg total) by mouth daily. 30 capsule 1 02/11/2017  . insulin aspart (NOVOLOG) 100 UNIT/ML injection Inject 5 Units into the skin 3 (three) times daily with meals. (Patient taking differently: Inject 6 Units into the skin 3 (three) times daily with meals. ) 10 mL 2 02/11/2017  . insulin glargine (LANTUS) 100 UNIT/ML injection Inject 0.4 mLs (40 Units total) into the skin at bedtime. (Patient taking differently: Inject 45 Units into the skin at bedtime. ) 10 mL 2 02/11/2017  . lisinopril (PRINIVIL,ZESTRIL) 20 MG tablet Take 1 tablet (20 mg total) by mouth daily. 30 tablet 1 02/11/2017  . metFORMIN (GLUCOPHAGE) 1000 MG tablet Take 1 tablet (1,000 mg total) by mouth 2 (two) times daily with a meal. 60 tablet 1 02/11/2017  . pantoprazole (PROTONIX) 40 MG tablet Take 1 tablet (40 mg  total) by mouth daily. 30 tablet 1 02/11/2017  . propranolol (INDERAL) 20 MG tablet Take 1 tablet (20 mg total) by mouth 2 (two) times daily. 60 tablet 1 02/11/2017  . simvastatin (ZOCOR) 20 MG tablet Take 20 mg by mouth daily.   02/11/2017  . temazepam (RESTORIL) 7.5 MG capsule Take 7.5 mg by mouth at bedtime.   02/11/2017  . montelukast (SINGULAIR) 10 MG tablet Take 1 tablet (10 mg total) by mouth at bedtime. (Patient not taking: Reported on 02/15/2017) 30 tablet 1 Not Taking at Unknown time    Patient Stressors: Marital or family conflict Other: blood sugar  Patient Strengths: General fund of knowledge Motivation for treatment/growth  Treatment Modalities: Medication Management, Group therapy, Case management,  1 to 1 session with clinician, Psychoeducation, Recreational therapy.   Physician Treatment Plan for Primary Diagnosis: Schizoaffective disorder, depressive type ([place]) Long Term Goal(s): Improvement in symptoms so as ready for discharge  Short Term Goals: Ability to verbalize feelings will improve Ability to demonstrate self-control will improve Compliance with prescribed medications will improve Ability to verbalize feelings will improve Ability to identify and develop effective coping behaviors will improve Compliance with prescribed medications will improve  Medication Management: Evaluate patient's response, side effects, and tolerance of medication regimen.  Therapeutic Interventions: 1 to 1 sessions, Unit Group sessions and Medication administration.  Evaluation of Outcomes: Progressing   9/27: Patient with schizoaffective do , continues to have thought blocking , negative sx as well as HI. Pt continues to need a lot of support to take care of his ADLs as well as his medications.   Pt will benefit from being in a supervised, structured place - since he has been in and out of long hospital stays since May 2018 .  He has failed multiple trials of medications as well  as recent ECT treatments( this month).   Increase Clozaril to 25 mg po daily and 150 mg po qhs for psychosis. CBC with diff as per Clozaril therapy needs - Pharmacy consult placed- Results for TORON, BOWRING (MRN [id number]) as of 02/17/2017 11:46  Prozac 40 mg po daily for affective sx. Propranolol 20 mg po bid for anxiety, tachycardia. Trazodone 50 mg po qhs prn for insomnia. Pharmacy consult for clozaril therapy. Repeat BMP for hx of hyponatremia - WNL. Consulted Dr.Clapac regarding ECT treatments - per Dr.Clapac - pt  will not be a good candidate at this time. Will refer to [facility] for further management. Will give PPD today 02/17/2017 for possible [facility] referral.   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  Therapeutic Interventions: 1 to 1 sessions, Unit Group sessions and Medication administration.  Evaluation of Outcomes: Progressing   RN Treatment Plan for Primary Diagnosis: Schizoaffective disorder, depressive type ([place]) Long Term Goal(s): Knowledge of disease and therapeutic regimen to maintain health will improve  Short Term Goals: Ability to disclose and discuss suicidal ideas and Ability to identify and develop effective coping behaviors will improve  Medication Management: RN will administer medications as ordered by  provider, will assess and evaluate patient's response and provide education to patient for prescribed medication. RN will report any adverse and/or side effects to prescribing provider.  Therapeutic Interventions: 1 on 1 counseling sessions, Psychoeducation, Medication administration, Evaluate responses to treatment, Monitor vital signs and CBGs as ordered, Perform/monitor CIWA, COWS, AIMS and Fall Risk screenings as ordered, Perform wound care treatments as ordered.  Evaluation of Outcomes: Progressing   LCSW Treatment Plan for Primary Diagnosis: Schizoaffective disorder, depressive type ([place]) Long Term Goal(s): Safe transition to appropriate next level of care at discharge, Engage patient in therapeutic group addressing interpersonal concerns.  Short Term Goals: Engage patient in aftercare planning with referrals and resources  Therapeutic Interventions: Assess for all discharge needs, 1 to 1 time with Social worker, Explore available resources and support systems, Assess for adequacy in community support network, Educate family and significant other(s) on suicide prevention, Complete Psychosocial Assessment, Interpersonal group therapy.  Evaluation of Outcomes: Not Met  Referring pt to both group home and [facility]. Have sent referral to [facility], they are asking for additional labs-will send when completed.  Pt and I are to call mother today with help of interpreter.    Progress in Treatment: Attending groups:No Participating in groups: No Taking medication as prescribed: Yes Toleration medication: Yes, no side effects reported at this time Family/Significant other contact made: No Patient understands diagnosis: No Limited insight Discussing patient identified problems/goals with staff: Yes Medical problems stabilized or resolved: Yes Denies suicidal/homicidal ideation: Yes Issues/concerns per patient self-inventory: None Other: [name]  New problem(s) identified: None identified at this time.    New Short Term/Long Term Goal(s): Chose to not participate in treatment team process.  Discharge Plan or Barriers:   Reason for Continuation of Hospitalization:  Homicidal ideation  Medication stabilization Suicidal ideation   Estimated Length of Stay:10/2  Attendees: Patient: 02/18/2017  11:03 AM  Physician: Marchelle Folks, MD 02/18/2017  11:03 AM  Nursing: Sena Hitch, RN 02/18/2017  11:03 AM  RN Care Manager: Lars Pinks, RN 02/18/2017  11:03 AM  Social Worker: Ripley Fraise 02/18/2017  11:03 AM  Recreational Therapist: Winfield Cunas 02/18/2017  11:03 AM  Other: Norberto Sorenson 02/18/2017  11:03 AM  Other:  02/18/2017  11:03 AM    Scribe for Treatment Team:  Roque Lias LCSW 02/18/2017 11:03 AM

## 2017-02-18 NOTE — Plan of Care (Signed)
Problem: Safety: Goal: Periods of time without injury will increase Outcome: Completed/Met Date Met: 02/18/17 No periods of self harm while on the unit.

## 2017-02-18 NOTE — Progress Notes (Signed)
Patient has been in his room for the majority of the shift.   Patient continues to endorse SI and VH.  Patient was encouraged out of bed and writer assisted patient with hygiene needs. Patient is in no acute distress had had no incidents of behavioral dyscontrol.    Assess patient for safety, offer medications as prescribed, engage patient in 1:1 staff talks.   Patient able to contract for safety, Continue to monitor as planned.  

## 2017-02-18 NOTE — Progress Notes (Signed)
D:  Tony Cunningham was in his room much of the evening.  He did get up for evening wrap up group.  He reported that he had a "so so" day.  He continues to voice some passive SI and is able to contract for safety on the unit.  He denies any pain or discomfort and appears to be in no physical distress.  He continues to hear voices but they don't tell him to do things.  No HI or visual hallucinations.   A:  1:1 interaction for support and encouragement.  Medications as ordered.  Encouraged continued participation in group and unit activities.  Q 15 minute checks maintained for safety.  CBG completed as ordered. R:  Tony Cunningham remains safe on the unit.  We will continue tom monitor the progress towards his goals.

## 2017-02-18 NOTE — Progress Notes (Signed)
Nursing Progress Note 1900-0730  D) Patient presents with anxious mood and preoccupation. Patient tearful at times with thought blocking but asks writer, "am I safe?" Patient very fixated on snacks this evening and requested extra from several staff members. CBG elevated this evening, 324. Patient medicated with sliding scale and educated about Sugar Free snacks. Patient attended group and was medication compliant. Patient reports passive SI and auditory hallucinations but denies HI or pain. Patient contracts for safety on the unit. Patient reports sleeping well with current regimen.  A) Emotional support given. 1:1 interaction and active listening provided. Patient medicated as prescribed. Medications and plan of care reviewed with patient. Patient verbalized understanding without further questions. Snacks and fluids provided. Opportunities for questions or concerns presented to patient. Patient encouraged to continue to work on treatment goals. Labs, vital signs and patient behavior monitored throughout shift. Patient safety maintained with q15 min safety checks. High fall risk precautions in place and reviewed with patient; patient verbalized understanding.  R) Patient receptive to interaction with nurse. Patient remains safe on the unit at this time. Patient denies any adverse medication reactions at this time. Patient is resting in bed without complaints. Will continue to monitor.

## 2017-02-18 NOTE — Progress Notes (Signed)
Patient attended group and said that his day was a 6.  Patient said that he was excited because he had a good conversation with his mom.

## 2017-02-18 NOTE — Progress Notes (Signed)
[facility] MD Progress Note  02/18/2017 3:46 PM Tony Cunningham  MRN:  [id number] Subjective: Patient reports he feels slightly better than he did. He continues to report auditory hallucinations , depression. He denies suicidal plan or intention at this time and contracts for safety on unit.  Does not endorse medication side effects.   Objective: I have discussed case with treatment team and have met with patient. Staff reports that patient continues to present withdrawn, isolative, with limited interactions, poor speech, but that with encouragement he does become more communicative and reactive.  Group /milieu participation has been limited. He reports ongoing depression, auditory hallucinations . ( Currently does not present internally preoccupied ) . He is denies medication side effects. Staff reports that he presents with some sedation. At this time alert and attentive. At present denies suicidal plan or intention and contracts for safety on unit .      Principal Problem: Schizoaffective disorder, depressive type ([place]) Diagnosis:   Patient Active Problem List   Diagnosis Date Noted  . 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 Schizoaffective disorder, depressive type ([place]) [F25.1] 09/23/2016  . Polysubstance abuse [F19.10] 07/17/2016  . Tardive dyskinesia [G24.01] 07/16/2016  . Tobacco use disorder [F17.200] 07/07/2016  . Dyslipidemia [E78.5] 07/07/2016  . Asthma [J45.909] 07/07/2016  . HTN (hypertension) [I10] 07/06/2016  . Diabetes ([place]) [E11.9] 12/25/2010   Total Time spent with patient: 20 minutes  Past Psychiatric History: Please see H&P.   Past Medical History:  Past Medical History:  Diagnosis Date  . Anxiety   . Asthma   . Diabetes mellitus   . High blood pressure   . Sinus complaint    History reviewed. No pertinent surgical history. Family History:Please see H&P.  Family Psychiatric  History: Please see H&P.  Social History: Please see  H&P.  History  Alcohol Use No     History  Drug Use No    Social History   Social History  . Marital status: Single    Spouse name: [name]  . Number of children: [name]  . Years of education: [name]   Social History Main Topics  . Smoking status: Current Every Day Smoker    Packs/day: 0.50    Types: Cigarettes  . Smokeless tobacco: Never Used  . Alcohol use No  . Drug use: No  . Sexual activity: Not Currently   Other Topics Concern  . None   Social History Narrative  . None   Additional Social History:    Pain Medications: See MAR Prescriptions: See MAR Over the Counter: See MAR History of alcohol / drug use?: No history of alcohol / drug abuse  Sleep: improving   Appetite:  improving  Current Medications: Current Facility-Administered Medications  Medication Dose Route Frequency Provider Last Rate Last Dose  . acetaminophen (TYLENOL) tablet 650 mg  650 mg Oral Q6H PRN Lindon Romp A, NP      . alum & mag hydroxide-simeth (MAALOX/MYLANTA) 200-200-20 MG/5ML suspension 30 mL  30 mL Oral Q4H PRN Lindon Romp A, NP      . cloZAPine (CLOZARIL) tablet 150 mg  150 mg Oral QHS Lindon Romp A, NP   150 mg at 02/17/17 2126  . cloZAPine (CLOZARIL) tablet 25 mg  25 mg Oral Daily Ursula Alert, MD   25 mg at 02/18/17 0837  . FLUoxetine (PROZAC) capsule 40 mg  40 mg Oral Daily Lindon Romp A, NP   40 mg at 02/18/17 0837  . insulin aspart (novoLOG) injection 0-15 Units  0-15 Units Subcutaneous TID WC Ursula Alert, MD   3 Units at 02/18/17 1230  . insulin aspart (novoLOG) injection 0-5 Units  0-5 Units Subcutaneous QHS Ursula Alert, MD   2 Units at 02/17/17 2127  . insulin aspart (novoLOG) injection 5 Units  5 Units Subcutaneous TID WC Lindon Romp A, NP   5 Units at 02/17/17 0650  . insulin glargine (LANTUS) injection 40 Units  40 Units Subcutaneous QHS Lindon Romp A, NP   40 Units at 02/17/17 2127  . lisinopril (PRINIVIL,ZESTRIL) tablet 20 mg  20 mg Oral Daily Lindon Romp A,  NP   20 mg at 02/18/17 5053  . magnesium hydroxide (MILK OF MAGNESIA) suspension 30 mL  30 mL Oral Daily PRN Lindon Romp A, NP      . metFORMIN (GLUCOPHAGE) tablet 1,000 mg  1,000 mg Oral BID WC Lindon Romp A, NP   1,000 mg at 02/18/17 0836  . montelukast (SINGULAIR) tablet 10 mg  10 mg Oral QHS Lindon Romp A, NP   10 mg at 02/17/17 2126  . nicotine (NICODERM CQ - dosed in mg/24 hours) patch 21 mg  21 mg Transdermal Daily Eappen, Saramma, MD      . OLANZapine zydis (ZYPREXA) disintegrating tablet 5 mg  5 mg Oral TID PRN Ursula Alert, MD       Or  . OLANZapine (ZYPREXA) injection 5 mg  5 mg Intramuscular TID PRN Eappen, Saramma, MD      . pantoprazole (PROTONIX) EC tablet 40 mg  40 mg Oral Daily Lindon Romp A, NP   40 mg at 02/18/17 0837  . propranolol (INDERAL) tablet 20 mg  20 mg Oral BID Lindon Romp A, NP   20 mg at 02/18/17 9767  . traZODone (DESYREL) tablet 50 mg  50 mg Oral QHS,MR X 1 Lindon Romp A, NP   50 mg at 02/17/17 2126  . tuberculin injection 5 Units  5 Units Intradermal Once Ursula Alert, MD   5 Units at 02/17/17 1402    Lab Results:  Results for orders placed or performed during the hospital encounter of 02/14/17 (from the past 48 hour(s))  Glucose, capillary     Status: Abnormal   Collection Time: 02/16/17  4:55 PM  Result Value Ref Range   Glucose-Capillary 400 (H) 65 - 99 mg/dL  Glucose, capillary     Status: Abnormal   Collection Time: 02/16/17  8:39 PM  Result Value Ref Range   Glucose-Capillary 246 (H) 65 - 99 mg/dL  Glucose, capillary     Status: Abnormal   Collection Time: 02/17/17  6:34 AM  Result Value Ref Range   Glucose-Capillary 162 (H) 65 - 99 mg/dL  CBC with Differential/Platelet     Status: None   Collection Time: 02/17/17  6:42 AM  Result Value Ref Range   WBC 6.3 4.0 - 10.5 K/uL   RBC 4.58 4.22 - 5.81 MIL/uL   Hemoglobin 13.7 13.0 - 17.0 g/dL   HCT 39.4 39.0 - 52.0 %   MCV 86.0 78.0 - 100.0 fL   MCH 29.9 26.0 - 34.0 pg   MCHC 34.8 30.0  - 36.0 g/dL   RDW 12.2 11.5 - 15.5 %   Platelets 201 150 - 400 K/uL   Neutrophils Relative % 43 %   Neutro Abs 2.7 1.7 - 7.7 K/uL   Lymphocytes Relative 46 %   Lymphs Abs 2.9 0.7 - 4.0 K/uL   Monocytes Relative 5 %   Monocytes Absolute 0.3 0.1 -  1.0 K/uL   Eosinophils Relative 5 %   Eosinophils Absolute 0.3 0.0 - 0.7 K/uL   Basophils Relative 1 %   Basophils Absolute 0.1 0.0 - 0.1 K/uL    Comment: Performed at [facility], [place] [address], [place], [place] [address]  Basic metabolic panel     Status: Abnormal   Collection Time: 02/17/17  6:42 AM  Result Value Ref Range   Sodium 142 135 - 145 mmol/L   Potassium 3.8 3.5 - 5.1 mmol/L   Chloride 104 101 - 111 mmol/L   CO2 28 22 - 32 mmol/L   Glucose, Bld 170 (H) 65 - 99 mg/dL   BUN 9 6 - 20 mg/dL   Creatinine, Ser 0.55 (L) 0.61 - 1.24 mg/dL   Calcium 8.8 (L) 8.9 - 10.3 mg/dL   GFR calc non Af Amer >60 >60 mL/min   GFR calc Af Amer >60 >60 mL/min    Comment: (NOTE) The eGFR has been calculated using the CKD EPI equation. This calculation has not been validated in all clinical situations. eGFR's persistently <60 mL/min signify possible Chronic Kidney Disease.    Anion gap 10 5 - 15    Comment: Performed at [facility], [place] [address], [place],  [address]  Glucose, capillary     Status: Abnormal   Collection Time: 02/17/17 12:03 PM  Result Value Ref Range   Glucose-Capillary 238 (H) 65 - 99 mg/dL  Glucose, capillary     Status: None   Collection Time: 02/17/17  4:51 PM  Result Value Ref Range   Glucose-Capillary 75 65 - 99 mg/dL  Glucose, capillary     Status: Abnormal   Collection Time: 02/17/17  9:00 PM  Result Value Ref Range   Glucose-Capillary 222 (H) 65 - 99 mg/dL   Comment 1 Notify RN   Glucose, capillary     Status: Abnormal   Collection Time: 02/18/17  6:24 AM  Result Value Ref Range   Glucose-Capillary 179 (H) 65 - 99 mg/dL  Glucose, capillary     Status: Abnormal    Collection Time: 02/18/17 12:22 PM  Result Value Ref Range   Glucose-Capillary 179 (H) 65 - 99 mg/dL   Comment 1 Notify RN    Comment 2 Document in Chart     Blood Alcohol level:  Lab Results  Component Value Date   ETH <5 01/16/2017   ETH <5 77/82/4235    Metabolic Disorder Labs: Lab Results  Component Value Date   HGBA1C 7.6 (H) 01/19/2017   MPG 171.42 01/19/2017   MPG 249 11/09/2016   Lab Results  Component Value Date   PROLACTIN 24.5 (H) 09/24/2016   PROLACTIN 3.4 (L) 07/07/2016   Lab Results  Component Value Date   CHOL 122 01/19/2017   TRIG 154 (H) 01/19/2017   HDL 36 (L) 01/19/2017   CHOLHDL 3.4 01/19/2017   VLDL 31 01/19/2017   LDLCALC 55 01/19/2017   LDLCALC 59 12/28/2016    Physical Findings: AIMS: Facial and Oral Movements Muscles of Facial Expression: None, normal Lips and Perioral Area: None, normal Jaw: None, normal Tongue: None, normal,Extremity Movements Upper (arms, wrists, hands, fingers): Mild Lower (legs, knees, ankles, toes): None, normal, Trunk Movements Neck, shoulders, hips: None, normal, Overall Severity Severity of abnormal movements (highest score from questions above): Minimal Incapacitation due to abnormal movements: None, normal Patient's awareness of abnormal movements (rate only patient's report): No Awareness, Dental Status Current problems with teeth and/or dentures?: No Does  patient usually wear dentures?: No  CIWA:  CIWA-Ar Total: 4 COWS:     Musculoskeletal: Strength & Muscle Tone: within normal limits Gait & Station: normal Patient leans: [name]  Psychiatric Specialty Exam: Physical Exam  Nursing note and vitals reviewed.   Review of Systems  Psychiatric/Behavioral: Positive for depression and hallucinations. The patient is nervous/anxious.   All other systems reviewed and are negative. denies chest pain, no shortness of breath  Blood pressure 127/80, pulse 81, temperature 98.6 F (37 C), temperature source Oral,  resp. rate 20, height 6' (1.829 m), weight 76.7 kg (169 lb).Body mass index is 22.92 kg/m.  General Appearance: Fairly Groomed  Eye Contact:  Fair  Speech:  Slow  Volume:  soft   Mood:  depressed  Affect:  blunted, but did smile briefly during session  Thought Process:  Goal Directed and Descriptions of Associations: Circumstantial thought process presents slowed, some thought blocking has been noticed by staff   Orientation:  Alert and attentive   Thought Content:  reports hallucinations telling him to die,at this time does not present internally preoccupied, no delusions are curently expressed     Suicidal Thoughts:  No denies suicidal or self injurious ideations at this time, and contracts for safety at this time   Homicidal Thoughts:  No currently does not endorse homicidal or violent ideations  Memory:  Recent and remote fair   Judgement:  Fair  Insight:  Fair and Shallow  Psychomotor Activity:  Decreased- presents with steady but slow gait   Concentration:  Concentration: Fair and Attention Span: Fair  Recall:  [employer] of Knowledge:  Fair  Language:  Fair  Akathisia:  No  Handed:  Right  AIMS (if indicated):     Assets:  Desire for Improvement  ADL's:  Impaired  Cognition:  Impaired,  Mild  Sleep:  Number of Hours: 6.75    Assessment - 41 year old male with history of chronic mental illness, has been diagnosed with Schizoaffective Disorder, Depressed . History of prior psychiatric admissions, presented due to worsening depression and hallucinations. Currently presents blunted in affect, with slowed thought process, and reports ongoing hallucinations, but does not present internally preoccupied at present. Denies suicidal plan or intentions and contracts for safety. Staff reports that although isolative,blunted, he is more reactive and responsive to support and encouragement. Denies medication side effects at this time.  Treatment Plan Summary: Treatment Plan reviewed as  below today 9/27  Continue to encourage group and milieu participation to work on coping skills and symptom reduction Continue Prozac 40 mgrs QDAY for depression Continue Clozaril , now titrated to 25 mgrs QAM and 150 mgrs QHS, for psychosis and for mood disorder  Continue Trazodone 50 mgrs QHS PRN for insomnia  Treatment team working on disposition planning , [facility] referral is being considered    Ref. Range 02/17/2017 06:42  WBC Latest Ref Range: 4.0 - 10.5 K/uL 6.3  RBC Latest Ref Range: 4.22 - 5.81 MIL/uL 4.58  Hemoglobin Latest Ref Range: 13.0 - 17.0 g/dL 13.7  HCT Latest Ref Range: 39.0 - 52.0 % 39.4  MCV Latest Ref Range: 78.0 - 100.0 fL 86.0  MCH Latest Ref Range: 26.0 - 34.0 pg 29.9  MCHC Latest Ref Range: 30.0 - 36.0 g/dL 34.8  RDW Latest Ref Range: 11.5 - 15.5 % 12.2  Platelets Latest Ref Range: 150 - 400 K/uL 201       Jenne Campus, MD 02/18/2017, 3:46 PM   Patient ID: Tony Cunningham, male   DOB: 12/12/1975, 41 y.o.   MRN: [id number]

## 2017-02-18 NOTE — Progress Notes (Signed)
Recreation Therapy Notes  Date: 02/18/17 Time: 1000 Location: 500 Hall Dayroom  Group Topic: Self-Esteem  Goal Area(s) Addresses:  Patient will successfully identify positive attributes about themselves.  Patient will successfully identify benefit of improved self-esteem.   Intervention: Worksheets, Freight forwarder  Activity: My Strengths and Qualities.  Patients were given a worksheet that was broken down into eight areas (things I'm good at, appearance, helped others, most value, compliments I received, challenges I've overcome, what makes you unique and times I've made others happy).  Patients were to list 3 things for each section.  Education: Self-Esteem, Building control surveyor.   Education Outcome: Acknowledges education/In group clarification offered/Needs additional education  Clinical Observations/Feedback: Pt did not attend group.  [name], LRT/CTRS        [name] A 02/18/2017 12:44 PM

## 2017-02-18 NOTE — [facility] Group Notes (Signed)
LCSW Group Therapy 02/18/2017 1:15pm  Type of Therapy and Topic:  Group Therapy:  Change and Accountability  Participation Level:  Did Not Attend  Description of Group In this group, patients discussed power and accountability for change.  The group identified the challenges related to accountability and the difficulty of accepting the outcomes of negative behaviors.  Patients were encouraged to openly discuss a challenge/change they could take responsibility for.  Patients discussed the use of "change talk" and positive thinking as ways to support achievement of personal goals.  The group discussed ways to give support and empowerment to peers.  Therapeutic Goals: 1. Patients will state the relationship between personal power and accountability in the change process 2. Patients will identify the positive and negative consequences of a personal choice they have made 3. Patients will identify one challenge/choice they will take responsibility for making 4. Patients will discuss the role of "change talk" and the impact of positive thinking as it supports successful personal change 5. Patients will verbalize support and affirmation of change efforts in peers  Summary of Patient Progress:    Therapeutic Modalities Solution Focused Brief Therapy Motivational Interviewing Cognitive Behavioral Therapy  Tony Cunningham Work 02/18/2017 2:12 PM

## 2017-02-19 LAB — GLUCOSE, CAPILLARY
GLUCOSE-CAPILLARY: 122 mg/dL — AB (ref 65–99)
GLUCOSE-CAPILLARY: 66 mg/dL (ref 65–99)
Glucose-Capillary: 351 mg/dL — ABNORMAL HIGH (ref 65–99)
Glucose-Capillary: 276 mg/dL — ABNORMAL HIGH (ref 65–99)

## 2017-02-19 NOTE — Progress Notes (Addendum)
Nursing Progress Note 1900-0730  D) Patient presents with labile mood. Patient was laughing and smiling with staff then tearful and asking "I need to see Dr. Toni Amend". Patient anxious and supported with PRN medications. Patient asks staff, "am I safe?" Patient does fixate on snacks and ginger ale but is accepting with staff when redirected with reasonable limits. Patient attended group this evening. Patient complained of a mild headache and requested Tylenol. CBG elevated this evening, patient medicated as prescribed. Patient denies SI/HI/AVH or pain. Patient contracts for safety on the unit. Patient reports sleeping well with current regimen. Patient compliant with medications.  A) Emotional support given. 1:1 interaction and active listening provided. Patient medicated as prescribed. Medications and plan of care reviewed with patient. Patient verbalized understanding without further questions. Snacks and fluids provided. Opportunities for questions or concerns presented to patient. Patient encouraged to continue to work on treatment goals. Labs, vital signs and patient behavior monitored throughout shift. Patient safety maintained with q15 min safety checks. High fall risk precautions in place and reviewed with patient; patient verbalized understanding.  R) Patient receptive to interaction with nurse. Patient remains safe on the unit at this time. Patient denies any adverse medication reactions at this time. Patient is resting in bed without complaints. Will continue to monitor.

## 2017-02-19 NOTE — Plan of Care (Signed)
Problem: Activity: Goal: Interest or engagement in activities will improve Outcome: Progressing Patient is observed up in the milieu and attended group.  Problem: Health Behavior/Discharge Planning: Goal: Compliance with treatment plan for underlying cause of condition will improve Outcome: Progressing Patient taking medications and attending groups per plan of care. Patient verbalizes understanding and is agreeable to current plan of care.

## 2017-02-19 NOTE — Progress Notes (Signed)
Patient has been in his room for the majority of the shift.   Patient continues to endorse SI and VH.  Patient was encouraged out of bed and writer assisted patient with hygiene needs. Patient is in no acute distress had had no incidents of behavioral dyscontrol.    Assess patient for safety, offer medications as prescribed, engage patient in 1:1 staff talks.   Patient able to contract for safety, Continue to monitor as planned.

## 2017-02-19 NOTE — [facility] Group Notes (Signed)
[facility] LCSW Group Therapy  02/19/2017  1:05 PM  Type of Therapy:  Group therapy  Participation Level:  Active  Participation Quality:  Attentive  Affect:  Flat  Cognitive:  Oriented  Insight:  Limited  Engagement in Therapy:  Limited  Modes of Intervention:  Discussion, Socialization  Summary of Progress/Problems:  Chaplain was here to lead a group on themes of hope and courage. "I like being around other people, but it is not easy for me.  Sometimes I feel hopeless, and I have to tell myself not to." Stayed the entire time, engaged throughout.  [name] 02/19/2017 1:26 PM

## 2017-02-19 NOTE — Progress Notes (Signed)
Patient attended group and said that his day was a 7. His coping skills for today were socializing, and talking to his Mom.

## 2017-02-19 NOTE — Progress Notes (Addendum)
[facility] MD Progress Note  02/19/2017 5:28 PM Tony Cunningham  MRN:  [id number] Subjective: patient reports he continues to feel very depressed, and reports ongoing auditory hallucinations, hearing voices that tell him to die. Denies medication side effects. Denies current suicidal plans or intentions and contracts for safety on unit. States that he feels ECT has helped in the past, and expresses hope that this treatment option may be available to him.    Objective: I have discussed case with treatment team and have met with patient. Patient continues to present with depressed mood, blunted and constricted affect, tearful at times . Denies suicidal plan or intention, and contracts for safety on unit . Describes ongoing hallucinations, but does not present internally preoccupied , no delusional ideations expressed / noted . As discussed with staff, with encouragement  from staff ADLs are improving and has been more visible on unit, visible on unit and going to some groups. Denies medication side effects. As noted, expresses interest in ECT, which he states he has had " many times " in the past   Principal Problem: Schizoaffective disorder, depressive type ([place]) Diagnosis:   Patient Active Problem List   Diagnosis Date Noted  . 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 Schizoaffective disorder, depressive type ([place]) [F25.1] 09/23/2016  . Polysubstance abuse [F19.10] 07/17/2016  . Tardive dyskinesia [G24.01] 07/16/2016  . Tobacco use disorder [F17.200] 07/07/2016  . Dyslipidemia [E78.5] 07/07/2016  . Asthma [J45.909] 07/07/2016  . HTN (hypertension) [I10] 07/06/2016  . Diabetes ([place]) [E11.9] 12/25/2010   Total Time spent with patient: 20 minutes  Past Psychiatric History: Please see H&P.   Past Medical History:  Past Medical History:  Diagnosis Date  . Anxiety   . Asthma   . Diabetes mellitus   . High blood pressure   . Sinus complaint    History reviewed. No  pertinent surgical history. Family History:Please see H&P.  Family Psychiatric  History: Please see H&P.  Social History: Please see H&P.  History  Alcohol Use No     History  Drug Use No    Social History   Social History  . Marital status: Single    Spouse name: [name]  . Number of children: [name]  . Years of education: [name]   Social History Main Topics  . Smoking status: Current Every Day Smoker    Packs/day: 0.50    Types: Cigarettes  . Smokeless tobacco: Never Used  . Alcohol use No  . Drug use: No  . Sexual activity: Not Currently   Other Topics Concern  . None   Social History Narrative  . None   Additional Social History:    Pain Medications: See MAR Prescriptions: See MAR Over the Counter: See MAR History of alcohol / drug use?: No history of alcohol / drug abuse  Sleep: improving   Appetite:  improving  Current Medications: Current Facility-Administered Medications  Medication Dose Route Frequency Provider Last Rate Last Dose  . acetaminophen (TYLENOL) tablet 650 mg  650 mg Oral Q6H PRN Lindon Romp A, NP      . alum & mag hydroxide-simeth (MAALOX/MYLANTA) 200-200-20 MG/5ML suspension 30 mL  30 mL Oral Q4H PRN Lindon Romp A, NP      . cloZAPine (CLOZARIL) tablet 150 mg  150 mg Oral QHS Lindon Romp A, NP   150 mg at 02/18/17 2125  . cloZAPine (CLOZARIL) tablet 25 mg  25 mg Oral Daily Ursula Alert, MD   25 mg at 02/19/17 0726  .  FLUoxetine (PROZAC) capsule 40 mg  40 mg Oral Daily Lindon Romp A, NP   40 mg at 02/19/17 0726  . insulin aspart (novoLOG) injection 0-15 Units  0-15 Units Subcutaneous TID WC Ursula Alert, MD   11 Units at 02/18/17 1720  . insulin aspart (novoLOG) injection 0-5 Units  0-5 Units Subcutaneous QHS Ursula Alert, MD   4 Units at 02/18/17 2130  . insulin aspart (novoLOG) injection 5 Units  5 Units Subcutaneous TID WC Lindon Romp A, NP   5 Units at 02/18/17 1721  . insulin glargine (LANTUS) injection 40 Units  40 Units  Subcutaneous QHS Lindon Romp A, NP   40 Units at 02/18/17 2129  . lisinopril (PRINIVIL,ZESTRIL) tablet 20 mg  20 mg Oral Daily Lindon Romp A, NP   20 mg at 02/19/17 0726  . magnesium hydroxide (MILK OF MAGNESIA) suspension 30 mL  30 mL Oral Daily PRN Lindon Romp A, NP      . metFORMIN (GLUCOPHAGE) tablet 1,000 mg  1,000 mg Oral BID WC Lindon Romp A, NP   1,000 mg at 02/19/17 0726  . montelukast (SINGULAIR) tablet 10 mg  10 mg Oral QHS Lindon Romp A, NP   10 mg at 02/18/17 2125  . nicotine (NICODERM CQ - dosed in mg/24 hours) patch 21 mg  21 mg Transdermal Daily Eappen, Saramma, MD      . OLANZapine zydis (ZYPREXA) disintegrating tablet 5 mg  5 mg Oral TID PRN Ursula Alert, MD       Or  . OLANZapine (ZYPREXA) injection 5 mg  5 mg Intramuscular TID PRN Eappen, Saramma, MD      . pantoprazole (PROTONIX) EC tablet 40 mg  40 mg Oral Daily Lindon Romp A, NP   40 mg at 02/19/17 0726  . propranolol (INDERAL) tablet 20 mg  20 mg Oral BID Lindon Romp A, NP   20 mg at 02/19/17 0726  . traZODone (DESYREL) tablet 50 mg  50 mg Oral QHS,MR X 1 Lindon Romp A, NP   50 mg at 02/18/17 2126    Lab Results:  Results for orders placed or performed during the hospital encounter of 02/14/17 (from the past 48 hour(s))  Glucose, capillary     Status: Abnormal   Collection Time: 02/17/17  9:00 PM  Result Value Ref Range   Glucose-Capillary 222 (H) 65 - 99 mg/dL   Comment 1 Notify RN   Glucose, capillary     Status: Abnormal   Collection Time: 02/18/17  6:24 AM  Result Value Ref Range   Glucose-Capillary 179 (H) 65 - 99 mg/dL  Glucose, capillary     Status: Abnormal   Collection Time: 02/18/17 12:22 PM  Result Value Ref Range   Glucose-Capillary 179 (H) 65 - 99 mg/dL   Comment 1 Notify RN    Comment 2 Document in Chart   Glucose, capillary     Status: Abnormal   Collection Time: 02/18/17  5:16 PM  Result Value Ref Range   Glucose-Capillary 305 (H) 65 - 99 mg/dL  Glucose, capillary     Status:  Abnormal   Collection Time: 02/18/17  8:40 PM  Result Value Ref Range   Glucose-Capillary 324 (H) 65 - 99 mg/dL   Comment 1 Notify RN    Comment 2 Document in Chart   Glucose, capillary     Status: Abnormal   Collection Time: 02/19/17  5:56 AM  Result Value Ref Range   Glucose-Capillary 122 (H) 65 - 99 mg/dL  Glucose, capillary     Status: None   Collection Time: 02/19/17 11:45 AM  Result Value Ref Range   Glucose-Capillary 66 65 - 99 mg/dL   Comment 1 Notify RN    Comment 2 Document in Chart   Glucose, capillary     Status: Abnormal   Collection Time: 02/19/17  4:58 PM  Result Value Ref Range   Glucose-Capillary 351 (H) 65 - 99 mg/dL    Blood Alcohol level:  Lab Results  Component Value Date   ETH <5 01/16/2017   ETH <5 33/54/5625    Metabolic Disorder Labs: Lab Results  Component Value Date   HGBA1C 7.6 (H) 01/19/2017   MPG 171.42 01/19/2017   MPG 249 11/09/2016   Lab Results  Component Value Date   PROLACTIN 24.5 (H) 09/24/2016   PROLACTIN 3.4 (L) 07/07/2016   Lab Results  Component Value Date   CHOL 122 01/19/2017   TRIG 154 (H) 01/19/2017   HDL 36 (L) 01/19/2017   CHOLHDL 3.4 01/19/2017   VLDL 31 01/19/2017   LDLCALC 55 01/19/2017   LDLCALC 59 12/28/2016    Physical Findings: AIMS: Facial and Oral Movements Muscles of Facial Expression: None, normal Lips and Perioral Area: None, normal Jaw: None, normal Tongue: None, normal,Extremity Movements Upper (arms, wrists, hands, fingers): Mild Lower (legs, knees, ankles, toes): None, normal, Trunk Movements Neck, shoulders, hips: None, normal, Overall Severity Severity of abnormal movements (highest score from questions above): Minimal Incapacitation due to abnormal movements: None, normal Patient's awareness of abnormal movements (rate only patient's report): No Awareness, Dental Status Current problems with teeth and/or dentures?: No Does patient usually wear dentures?: No  CIWA:  CIWA-Ar Total:  4 COWS:     Musculoskeletal: Strength & Muscle Tone: within normal limits Gait & Station: normal Patient leans: [name]  Psychiatric Specialty Exam: Physical Exam  Nursing note and vitals reviewed.   Review of Systems  Psychiatric/Behavioral: Positive for depression and hallucinations. The patient is nervous/anxious.   All other systems reviewed and are negative. denies headache, no chest pain, no shortness of breath  Blood pressure 107/73, pulse 76, temperature 98.6 F (37 C), temperature source Oral, resp. rate 20, height 6' (1.829 m), weight 76.7 kg (169 lb).Body mass index is 22.92 kg/m.  General Appearance: Fairly Groomed  Eye Contact:  improving   Speech:  Slow  Volume:  soft   Mood:  remains depressed, sad  Affect:  constricted, tearful at times   Thought Process:  Linear and Descriptions of Associations: Circumstantial , no thought blocking noted at this time, but thought process is slowed   Orientation:  Alert and attentive   Thought Content:  (+) auditory hallucinations, no delusions expressed, does not appeaer internally preoccupied at this time    Suicidal Thoughts:  No denies active suicidal or self injurious ideations at this time, and contracts for safety at this time   Homicidal Thoughts:  No currently does not endorse homicidal or violent ideations  Memory:  Recent and remote fair   Judgement:  Fair  Insight:  Fair  Psychomotor Activity:  improved today, noted to be walking less slowly and to be more visible on unit   Concentration:  Concentration: Fair and Attention Span: Fair  Recall:  [employer] of Knowledge:  Fair  Language:  Fair  Akathisia:  No  Handed:  Right  AIMS (if indicated):     Assets:  Desire for Improvement  ADL's:  Impaired  Cognition:  Impaired,  Mild  Sleep:  Number of Hours: 6.25    Assessment - Mr. Overby remains depressed, constricted and reporting auditory hallucinations. ADLs and milieu participation have improved with staff  encouragement and support . He reports being severely depressed, but denies suicidal ideations at this time. He is tolerating medications well thus far, and is expressing interested in ECT, which he has received before and which he states has helped in the past . Chart notes indicate that  Dr. Shea Evans discussed possible ECT management with Dr. Weber Cooks at [facility] , considered not to be an appropriate candidate at this time.   Treatment Plan Summary: Treatment Plan reviewed as below today 9/28 Continue to encourage group and milieu participation to work on coping skills and symptom reduction Continue Prozac 40 mgrs QDAY for depression Continue Clozaril  25 mgrs QAM and 150 mgrs QHS, for psychosis and for mood disorder  Continue Trazodone 50 mgrs QHS PRN for insomnia  Treatment team working on disposition planning , [place] referral is being considered     Ref. Range 02/17/2017 06:42  WBC Latest Ref Range: 4.0 - 10.5 K/uL 6.3  RBC Latest Ref Range: 4.22 - 5.81 MIL/uL 4.58  Hemoglobin Latest Ref Range: 13.0 - 17.0 g/dL 13.7  HCT Latest Ref Range: 39.0 - 52.0 % 39.4  MCV Latest Ref Range: 78.0 - 100.0 fL 86.0  MCH Latest Ref Range: 26.0 - 34.0 pg 29.9  MCHC Latest Ref Range: 30.0 - 36.0 g/dL 34.8  RDW Latest Ref Range: 11.5 - 15.5 % 12.2  Platelets Latest Ref Range: 150 - 400 K/uL 201       Jenne Campus, MD 02/19/2017, 5:28 PM   Patient ID: Sung Amabile, male   DOB: 14-Nov-1975, 41 y.o.   MRN: [id number]

## 2017-02-19 NOTE — Progress Notes (Signed)
PPD read this evening by Clinical research associate. Induration is 0 cm. PPD appears negative. NP Nira Conn notified.

## 2017-02-19 NOTE — Progress Notes (Signed)
Recreation Therapy Notes  Date: 02/19/17 Time: 1000 Location: 500 Hall Dayroom  Group Topic: Communication, Team Building, Problem Solving  Goal Area(s) Addresses:  Patient will effectively work with peer towards shared goal.  Patient will identify skill used to make activity successful.  Patient will identify how skills used during activity can be used to reach post d/c goals.   Intervention: STEM Activity   Activity: [employer]. In teams, patients were asked to build the tallest freestanding tower possible out of 15 pipe cleaners. Systematically resources were removed, for example patient ability to use both hands and patient ability to verbally communicate.    Education:Social Skills, Discharge Planning.   Education Outcome: Acknowledges education/In group clarification offered/Needs additional education.   Clinical Observations/Feedback: Pt did not attend group.   [name], LRT/CTRS         [name] A 02/19/2017 1:04 PM

## 2017-02-19 NOTE — Plan of Care (Signed)
Problem: Safety: Goal: Periods of time without injury will increase Outcome: Progressing Patient is on q15 minute safety checks and high fall risk precautions. Patient contracts for safety on the unit and remains safe at this time.  Problem: Education: Goal: Emotional status will improve Outcome: Progressing Patient with brighter affect this evening and is laughing/smiling with staff.

## 2017-02-20 DIAGNOSIS — R443 Hallucinations, unspecified: Secondary | ICD-10-CM

## 2017-02-20 LAB — GLUCOSE, CAPILLARY
GLUCOSE-CAPILLARY: 79 mg/dL (ref 65–99)
Glucose-Capillary: 101 mg/dL — ABNORMAL HIGH (ref 65–99)
Glucose-Capillary: 347 mg/dL — ABNORMAL HIGH (ref 65–99)
Glucose-Capillary: 384 mg/dL — ABNORMAL HIGH (ref 65–99)

## 2017-02-20 NOTE — Progress Notes (Signed)
D: Pt presents with a flat affect and depressed mood. Pt appears guarded on approach and forwarded little information. Writer had a difficult time getting pt to answer questions this morning during assessment. Pt denies AVH this morning. Pt verbalized endorsing AVH yesterday. Pt denies SI. Pt endorses HI, denies anyone in particular. Pt verbally contracts to not hurt anyone here in the hosp. Pt reports fair sleep last night at bedtime. No side effects to meds verbalized by pt. A: Medications reviewed with pt. Medications administered as ordered per MD. Verbal support provided.15 minute checks performed for safety. R: No concerns verbalized by pt.

## 2017-02-20 NOTE — [facility] Group Notes (Signed)
  [facility] LCSW Group Therapy Note  Date/Time:  02/20/2017 11:15AM-12:00PM  Type of Therapy and Topic:  Group Therapy:  Feelings About Hospitalization  Participation Level:  Did Not Attend   Description of Group This process group involved patients discussing their feelings related to being hospitalized, as well as the benefits they see to being in the hospital.  These feelings and benefits were itemized.  The group then brainstormed specific ways in which they could seek those same benefits when they discharge and return home.  Therapeutic Goals 1. Patient will identify and describe positive and negative feelings related to hospitalization 2. Patient will verbalize benefits of hospitalization to themselves personally 3. Patients will brainstorm together ways they can obtain similar benefits in the outpatient setting, identify barriers to wellness and possible solutions  Summary of Patient Progress:  [name]  Therapeutic Modalities Cognitive Behavioral Therapy Motivational Interviewing    Ambrose Mantle, LCSW 02/20/2017, 2:59 PM

## 2017-02-20 NOTE — Progress Notes (Addendum)
[facility] MD Progress Note  02/20/2017 12:01 PM Tony Cunningham  MRN:  [id number] Subjective:  Patient seen, chart reviewed and case discussed with nursing staff. He is a very poor historian and answers to questions with yes/no or nodding heads. He is interviewed in his room as he declines to come to the office.   He states "yes" to [facility] of hurting self. He says "yes" he attended the interview. He says "no" to concern about his medication and agrees that he feels frustrated not being able to start on ECT. He has insomnia. He has SI but denies intent or plans. He denies VH. He had bowel movement today. He denies chest pain.   Principal Problem: Schizoaffective disorder, depressive type (HCC) Diagnosis:   Patient Active Problem List   Diagnosis Date Noted  . 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 Schizoaffective disorder, depressive type (HCC) [F25.1] 09/23/2016  . Polysubstance abuse [F19.10] 07/17/2016  . Tardive dyskinesia [G24.01] 07/16/2016  . Tobacco use disorder [F17.200] 07/07/2016  . Dyslipidemia [E78.5] 07/07/2016  . Asthma [J45.909] 07/07/2016  . HTN (hypertension) [I10] 07/06/2016  . Diabetes (HCC) [E11.9] 12/25/2010   Total Time spent with patient: 30 minutes  Past Psychiatric History: see HPI  Past Medical History:  Past Medical History:  Diagnosis Date  . Anxiety   . Asthma   . Diabetes mellitus   . High blood pressure   . Sinus complaint    History reviewed. No pertinent surgical history. Family History: History reviewed. No pertinent family history. Family Psychiatric  History:  See HPI  Social History:  History  Alcohol Use No     History  Drug Use No    Social History   Social History  . Marital status: Single    Spouse name: [name]  . Number of children: [name]  . Years of education: [name]   Social History Main Topics  . Smoking status: Current Every Day Smoker    Packs/day: 0.50    Types: Cigarettes  . Smokeless tobacco: Never Used  .  Alcohol use No  . Drug use: No  . Sexual activity: Not Currently   Other Topics Concern  . None   Social History Narrative  . None   Additional Social History:    Pain Medications: See MAR Prescriptions: See MAR Over the Counter: See MAR History of alcohol / drug use?: No history of alcohol / drug abuse                    Sleep: Poor  Appetite:  Good  Current Medications: Current Facility-Administered Medications  Medication Dose Route Frequency Provider Last Rate Last Dose  . acetaminophen (TYLENOL) tablet 650 mg  650 mg Oral Q6H PRN Nira Conn A, NP   650 mg at 02/19/17 2129  . alum & mag hydroxide-simeth (MAALOX/MYLANTA) 200-200-20 MG/5ML suspension 30 mL  30 mL Oral Q4H PRN Nira Conn A, NP      . cloZAPine (CLOZARIL) tablet 150 mg  150 mg Oral QHS Nira Conn A, NP   150 mg at 02/19/17 2129  . cloZAPine (CLOZARIL) tablet 25 mg  25 mg Oral Daily Jomarie Longs, MD   25 mg at 02/20/17 0813  . FLUoxetine (PROZAC) capsule 40 mg  40 mg Oral Daily Nira Conn A, NP   40 mg at 02/20/17 0813  . insulin aspart (novoLOG) injection 0-15 Units  0-15 Units Subcutaneous TID WC Jomarie Longs, MD   15 Units at 02/19/17 1811  . insulin aspart (  novoLOG) injection 0-5 Units  0-5 Units Subcutaneous QHS Jomarie Longs, MD   3 Units at 02/19/17 2130  . insulin aspart (novoLOG) injection 5 Units  5 Units Subcutaneous TID WC Nira Conn A, NP   5 Units at 02/19/17 1811  . insulin glargine (LANTUS) injection 40 Units  40 Units Subcutaneous QHS Nira Conn A, NP   40 Units at 02/19/17 2130  . lisinopril (PRINIVIL,ZESTRIL) tablet 20 mg  20 mg Oral Daily Nira Conn A, NP   20 mg at 02/20/17 0814  . magnesium hydroxide (MILK OF MAGNESIA) suspension 30 mL  30 mL Oral Daily PRN Nira Conn A, NP      . metFORMIN (GLUCOPHAGE) tablet 1,000 mg  1,000 mg Oral BID WC Nira Conn A, NP   1,000 mg at 02/20/17 0814  . montelukast (SINGULAIR) tablet 10 mg  10 mg Oral QHS Nira Conn A, NP    10 mg at 02/19/17 2129  . nicotine (NICODERM CQ - dosed in mg/24 hours) patch 21 mg  21 mg Transdermal Daily Eappen, Saramma, MD      . OLANZapine zydis (ZYPREXA) disintegrating tablet 5 mg  5 mg Oral TID PRN Jomarie Longs, MD   5 mg at 02/19/17 2133   Or  . OLANZapine (ZYPREXA) injection 5 mg  5 mg Intramuscular TID PRN Eappen, Saramma, MD      . pantoprazole (PROTONIX) EC tablet 40 mg  40 mg Oral Daily Nira Conn A, NP   40 mg at 02/20/17 0814  . propranolol (INDERAL) tablet 20 mg  20 mg Oral BID Nira Conn A, NP   20 mg at 02/20/17 0814  . traZODone (DESYREL) tablet 50 mg  50 mg Oral QHS,MR X 1 Nira Conn A, NP   50 mg at 02/19/17 2129    Lab Results:  Results for orders placed or performed during the hospital encounter of 02/14/17 (from the past 48 hour(s))  Glucose, capillary     Status: Abnormal   Collection Time: 02/18/17 12:22 PM  Result Value Ref Range   Glucose-Capillary 179 (H) 65 - 99 mg/dL   Comment 1 Notify RN    Comment 2 Document in Chart   Glucose, capillary     Status: Abnormal   Collection Time: 02/18/17  5:16 PM  Result Value Ref Range   Glucose-Capillary 305 (H) 65 - 99 mg/dL  Glucose, capillary     Status: Abnormal   Collection Time: 02/18/17  8:40 PM  Result Value Ref Range   Glucose-Capillary 324 (H) 65 - 99 mg/dL   Comment 1 Notify RN    Comment 2 Document in Chart   Glucose, capillary     Status: Abnormal   Collection Time: 02/19/17  5:56 AM  Result Value Ref Range   Glucose-Capillary 122 (H) 65 - 99 mg/dL  Glucose, capillary     Status: None   Collection Time: 02/19/17 11:45 AM  Result Value Ref Range   Glucose-Capillary 66 65 - 99 mg/dL   Comment 1 Notify RN    Comment 2 Document in Chart   Glucose, capillary     Status: Abnormal   Collection Time: 02/19/17  4:58 PM  Result Value Ref Range   Glucose-Capillary 351 (H) 65 - 99 mg/dL  Glucose, capillary     Status: Abnormal   Collection Time: 02/19/17  8:24 PM  Result Value Ref Range    Glucose-Capillary 276 (H) 65 - 99 mg/dL   Comment 1 Notify RN   Glucose,  capillary     Status: Abnormal   Collection Time: 02/20/17  6:10 AM  Result Value Ref Range   Glucose-Capillary 101 (H) 65 - 99 mg/dL  Glucose, capillary     Status: None   Collection Time: 02/20/17 11:59 AM  Result Value Ref Range   Glucose-Capillary 79 65 - 99 mg/dL    Blood Alcohol level:  Lab Results  Component Value Date   ETH <5 01/16/2017   ETH <5 12/21/2016    Metabolic Disorder Labs: Lab Results  Component Value Date   HGBA1C 7.6 (H) 01/19/2017   MPG 171.42 01/19/2017   MPG 249 11/09/2016   Lab Results  Component Value Date   PROLACTIN 24.5 (H) 09/24/2016   PROLACTIN 3.4 (L) 07/07/2016   Lab Results  Component Value Date   CHOL 122 01/19/2017   TRIG 154 (H) 01/19/2017   HDL 36 (L) 01/19/2017   CHOLHDL 3.4 01/19/2017   VLDL 31 01/19/2017   LDLCALC 55 01/19/2017   LDLCALC 59 12/28/2016    Physical Findings: AIMS: Facial and Oral Movements Muscles of Facial Expression: None, normal Lips and Perioral Area: None, normal Jaw: None, normal Tongue: None, normal,Extremity Movements Upper (arms, wrists, hands, fingers): Mild Lower (legs, knees, ankles, toes): None, normal, Trunk Movements Neck, shoulders, hips: None, normal, Overall Severity Severity of abnormal movements (highest score from questions above): Minimal Incapacitation due to abnormal movements: None, normal Patient's awareness of abnormal movements (rate only patient's report): No Awareness, Dental Status Current problems with teeth and/or dentures?: No Does patient usually wear dentures?: No  CIWA:  CIWA-Ar Total: 4 COWS:     Musculoskeletal: Strength & Muscle Tone: within normal limits Gait & Station: normal Patient leans: [name]  Psychiatric Specialty Exam: Physical Exam  Review of Systems  Psychiatric/Behavioral: Positive for depression, hallucinations and suicidal ideas. Negative for substance abuse. The patient  is nervous/anxious and has insomnia.   All other systems reviewed and are negative.   Blood pressure 110/65, pulse 66, temperature 98.4 F (36.9 C), resp. rate 20, height 6' (1.829 m), weight 169 lb (76.7 kg).Body mass index is 22.92 kg/m.  General Appearance: Fairly Groomed  Eye Contact:  Good  Speech:  Blocked  Volume:  Decreased  Mood:  does not answer  Affect:  Restricted  Thought Process:  NA linear, but does not elaborate and difficult to assess  Orientation:  Other:  does not answer  Thought Content:  thought blocking Perceptions: CAH of killing self, denies VH  Suicidal Thoughts:  Yes.  without intent/plan  Homicidal Thoughts:  No  Memory:  unable to assess as he does not answer  Judgement:  Impaired  Insight:  unable to assess as he does not answer  Psychomotor Activity:  Decreased  Concentration:  Concentration: Poor and Attention Span: Poor  Recall:  unable to assess as he does not answer  Fund of Knowledge:  unable to assess as he does not answer  Language:  unable to assess as he does not answer  Akathisia:  No  Handed:  Right  AIMS (if indicated):    no tremors, mild cogwheel rigidity on bilateral arms  Assets:  Desire for Improvement  ADL's:  Intact  Cognition:  Impaired,  Mild  Sleep:  Number of Hours: 6.25   Assessment Tony Cunningham is a 41 year old male with schizoaffective disorder, tardive dyskinesia, history of polysubstance abuse (abusing opiates, Lyrica, Remeron and Flexeril) with recent several inpatient admission/prior ECT, who presented for SI, HI, psychosis.   #  Schizoaffective disorder Exam is notable for his psychomotor retardation, thought blocking and he endorses CAH to kill himself, although he does not elaborate it. His clozapine was uptitrated on 9/26 without significant side effect, though he has mild cogwheel rigidity on bilateral arms. Will plan for further uptitration after obtaining labs tomorrow. Will continue fluoxetine for depression. Will  not try adjunctive treatment for depression this time given risk of seizure (wellbutrin), and he has history of mirtazapine abuse per chart. He does not have features of catatonia except mutism; will continue to monitor.   Plan - Continue clozapine 150 mg qhs, 25 mg daily (Confirmed registration at [facility]) - Obtain labs ([facility]) tomorrow, obtain baseline EKG - monitor bowel movement - Continue fluoxetine 40 mg daily - continue propranolol 20 mg BID  - Continue trazodone 50 mg qhs  Treatment Plan Summary: Daily contact with patient to assess and evaluate symptoms and progress in treatment  Neysa Hotter, MD 02/20/2017, 12:01 PM

## 2017-02-20 NOTE — Progress Notes (Signed)
Pt on unit drinking Mt Dew.  Pt advised that sugar will increase BS.  Pt gets ice cream for snack.  Pt just smiles when his BS is discussed with him.  BS is 384 requiring coverage.  Pt is non-compliant with diet and resists efforts to teach him to control his intake of sugar.  Pt sts just to give him his shot.  Pt sts he had visitor tonight and took a shower.  Pt remains to have strong body odor. Pt is med compliant. Pt remains safe on unit.

## 2017-02-20 NOTE — Progress Notes (Signed)
Patient attended group and said that his day was a 9.  Patient said his mother and little cousins stopped by to visit him.

## 2017-02-20 NOTE — Plan of Care (Signed)
Problem: Safety: Goal: Periods of time without injury will increase Outcome: Progressing Patient has not attempted any self injury however continues to endorse command AH to hurt or kill self.   Problem: Spiritual Needs Goal: Ability to function at adequate level Outcome: Not Progressing Continues with poor hygiene, minimal to no interaction with others.  Problem: Activity: Goal: Interest or engagement in activities will improve Outcome: Not Progressing Remains isolative and does not participate in activities Goal: Sleeping patterns will improve Outcome: Not Progressing Reports poor sleep at night and about 3 hours of sleep during the day  Problem: Education: Goal: Emotional status will improve Outcome: Not Progressing Distressed about his situation and about his mental illness. Stated "Will I ever get better?" when asked what better would be, stated "not being crazy". Patient sad, depressed, hopeless. Goal: Mental status will improve Outcome: Not Progressing Continues with auditory hallucinations, states no improvement on current medication and treatment regimen.   Problem: Coping: Goal: Ability to demonstrate self-control will improve Outcome: Progressing No episodes of behavioral dyscontrol or aggression.   Problem: Health Behavior/Discharge Planning: Goal: Identification of resources available to assist in meeting health care needs will improve Outcome: Not Progressing Unable to verbalize resources available to him for mental health care. Goal: Compliance with treatment plan for underlying cause of condition will improve Outcome: Progressing Compliant with medications but does not participate in group activities.

## 2017-02-21 DIAGNOSIS — F203 Undifferentiated schizophrenia: Secondary | ICD-10-CM

## 2017-02-21 DIAGNOSIS — F251 Schizoaffective disorder, depressive type: Principal | ICD-10-CM

## 2017-02-21 DIAGNOSIS — G2111 Neuroleptic induced parkinsonism: Secondary | ICD-10-CM

## 2017-02-21 LAB — CBC WITH DIFFERENTIAL/PLATELET
BASOS PCT: 1 %
Basophils Absolute: 0 10*3/uL (ref 0.0–0.1)
EOS ABS: 0.2 10*3/uL (ref 0.0–0.7)
EOS PCT: 4 %
HCT: 38.3 % — ABNORMAL LOW (ref 39.0–52.0)
Hemoglobin: 13.4 g/dL (ref 13.0–17.0)
LYMPHS ABS: 2.6 10*3/uL (ref 0.7–4.0)
Lymphocytes Relative: 45 %
MCH: 30.6 pg (ref 26.0–34.0)
MCHC: 35 g/dL (ref 30.0–36.0)
MCV: 87.4 fL (ref 78.0–100.0)
MONO ABS: 0.4 10*3/uL (ref 0.1–1.0)
MONOS PCT: 7 %
NEUTROS ABS: 2.4 10*3/uL (ref 1.7–7.7)
Neutrophils Relative %: 43 %
Platelets: 196 10*3/uL (ref 150–400)
RBC: 4.38 MIL/uL (ref 4.22–5.81)
RDW: 12.3 % (ref 11.5–15.5)
WBC: 5.6 10*3/uL (ref 4.0–10.5)

## 2017-02-21 LAB — GLUCOSE, CAPILLARY
GLUCOSE-CAPILLARY: 128 mg/dL — AB (ref 65–99)
GLUCOSE-CAPILLARY: 171 mg/dL — AB (ref 65–99)
Glucose-Capillary: 45 mg/dL — ABNORMAL LOW (ref 65–99)
Glucose-Capillary: 56 mg/dL — ABNORMAL LOW (ref 65–99)
Glucose-Capillary: 226 mg/dL — ABNORMAL HIGH (ref 65–99)
Glucose-Capillary: 230 mg/dL — ABNORMAL HIGH (ref 65–99)

## 2017-02-21 LAB — CK: CK TOTAL: 56 U/L (ref 49–397)

## 2017-02-21 MED ORDER — CLOZAPINE 25 MG PO TABS
25.0000 mg | ORAL_TABLET | Freq: Once | ORAL | Status: AC
  Administered 2017-02-21: 25 mg via ORAL
  Filled 2017-02-21: qty 1

## 2017-02-21 MED ORDER — CLOZAPINE 100 MG PO TABS
50.0000 mg | ORAL_TABLET | Freq: Every day | ORAL | Status: DC
  Administered 2017-02-22 – 2017-03-08 (×15): 50 mg via ORAL
  Filled 2017-02-21: qty 2
  Filled 2017-02-21: qty 14
  Filled 2017-02-21 (×2): qty 2
  Filled 2017-02-21: qty 14
  Filled 2017-02-21 (×4): qty 2
  Filled 2017-02-21: qty 14
  Filled 2017-02-21 (×5): qty 2
  Filled 2017-02-21: qty 14
  Filled 2017-02-21: qty 2

## 2017-02-21 MED ORDER — INSULIN ASPART 100 UNIT/ML ~~LOC~~ SOLN
5.0000 [IU] | Freq: Once | SUBCUTANEOUS | Status: AC
  Administered 2017-02-21: 5 [IU] via SUBCUTANEOUS

## 2017-02-21 NOTE — Progress Notes (Signed)
CBG on 15 minute recheck was 56, patient went to lunch and ate greater than 50% of meal. Post-lunch CBG 171, 5 units meal coverage given and consult entered by MD. New orders obtained. Patient isolative to room and bed this afternoon.

## 2017-02-21 NOTE — Progress Notes (Signed)
Lunch time CBG obtained and is "45." Dr. Vanetta Shawl notified and RN instructed to give orange juice. Patient made aware and consumed juice. Will recheck CBG in 15 mins.

## 2017-02-21 NOTE — Progress Notes (Signed)
Tony Cunningham attended evening wrap-up group and reported that he had a good day, when asked why the day was good he replied "I don't know it was just a good day". He spoke about how his goal for tomorrow is to work on learning how to relax. Tony Cunningham also was able to receive a bath this evening with minimal staff assistance.

## 2017-02-21 NOTE — Progress Notes (Signed)
Patient ID: Tony Cunningham, male   DOB: 1976-01-03, 41 y.o.   MRN: [id number]  Pt currently presents with a blunted affect and cooperative behavior. Pt mood is pleasant, manipulates staff. Had to be redirected when seen touching a male patients arm tonight. Verbalizes understanding. Pt reports "her had a good day." Pt reports good sleep with current medication regimen. Pt makes poor meal choices, asks for regular soda often.  Pt provided with medications per providers orders. Pt's labs and vitals were monitored throughout the night. Pt given a 1:1 about emotional and mental status. Pt supported and encouraged to express concerns and questions. Pt educated on medications and carb modified diet. Reoriented to rules and regulations of the unit.   Pt's safety ensured with 15 minute and environmental checks. Endorses auditory hallucinations, that say "bad things" to me. Pt judgement is limited. Pt currently denies SI/HI and visual hallucinations. Pt verbally agrees to seek staff if SI/HI or A/VH occurs and to consult with staff before acting on any harmful thoughts. Will continue POC.

## 2017-02-21 NOTE — [facility] Group Notes (Signed)
[facility] LCSW Group Therapy Note  Date/Time:  02/21/2017  11:00AM-12:00PM  Type of Therapy and Topic:  Group Therapy:  Music and Mood  Participation Level:  None   Description of Group: In this process group, members listened to a variety of genres of music and identified that different types of music evoke different responses.  Patients were encouraged to identify music that was soothing for them and music that was energizing for them.  Patients discussed how this knowledge can help with wellness and recovery in various ways including managing depression and anxiety as well as encouraging healthy sleep habits.    Therapeutic Goals: 1. Patients will explore the impact of different varieties of music on mood 2. Patients will verbalize the thoughts they have when listening to different types of music 3. Patients will identify music that is soothing to them as well as music that is energizing to them 4. Patients will discuss how to use this knowledge to assist in maintaining wellness and recovery 5. Patients will explore the use of music as a coping skill  Summary of Patient Progress:  The patient did arrive until the last 7 minutes of group, and sat eating a snack, did not participate.  Therapeutic Modalities: Solution Focused Brief Therapy Motivational Interviewing Activity   Ambrose Mantle, LCSW 02/21/2017 8:13 AM

## 2017-02-21 NOTE — Consult Note (Signed)
Discussed case with Psychiatry. Chart reviewed. We 41-year-old male with history of schizoaffective disorder, history of polysubstance abuse, history of insulin-dependent diabetes who presented to the behavioral service with suicidal and homicidal ideations with psychosis. During his hospital stay, patient was noted to have a consistent meals and as a result, noted to have areas of hyperglycemia as well as hypoglycemia. Hospitalist service consulted for assistance with diabetes management.  Vitals:   02/20/17 0702 02/21/17 0620  BP: 110/65 124/82  Pulse: 66 83  Resp: 20 16  Temp: 98.4 F (36.9 C) 98.1 F (36.7 C)   A/P Schizoaffective d/o with SI/HI -Continue per primary service  Insulin Dependent Diabetes -Patient is aymptomatic -Recent a1c reviewed, noted to be 7.6, suggesting fair control -Would continue current diabetic regimen including pre-meal insulin, lantus, metformin, SSI -Have placed order to hold pre-meal insulin if pt misses meal and to decrease to 2units if patient eats 1/2 of meal -If possible, recommend Diabetic Coordinator consultation. -Continue with hypoglycemic protocol, as you already have ordered -Encourage routine PO intake if possible  We will continue to follow along with you. Please call if there are any further questions.

## 2017-02-21 NOTE — [facility] Group Notes (Signed)
[facility] Group Notes:  (Nursing/MHT/Case Management/Adjunct)  Date:  02/21/2017  Time:  1330   Type of Therapy:  Nurse Education - Guided Meditation for Relaxation and Mindfulness  Participation Level:  Did Not Attend  Participation Quality:    Affect:    Cognitive:    Insight:    Engagement in Group:    Modes of Intervention:    Summary of Progress/Problems: Patient invited however elected not to attend.  Lawrence Marseilles 02/21/2017, 2:32 PM

## 2017-02-21 NOTE — Progress Notes (Signed)
[facility] MD Progress Note  02/21/2017 8:31 AM Tony Cunningham  MRN:  [id number] Subjective:   Patient seen, chart reviewed and case discussed with nursing staff. He had a visitor yesterday and has more spontaneous speech and had brighter affect.   He is a very poor historian and does not elaborate the story, mostly closing his eyes lying in the bed. He states "yes" to feeling depressed. He had a big smile on his face when he was asked about the visitor, stating that he met with his mother yesterday. He does not elaborate further. He has CAH of killing himself. He denies SI, HI, VH. He denies chest pain. He denies constipation or dizziness.  Principal Problem: Schizoaffective disorder, depressive type ([place]) Diagnosis:   Patient Active Problem List   Diagnosis Date Noted  . 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 Schizoaffective disorder, depressive type ([place]) [F25.1] 09/23/2016  . Polysubstance abuse [F19.10] 07/17/2016  . Tardive dyskinesia [G24.01] 07/16/2016  . Tobacco use disorder [F17.200] 07/07/2016  . Dyslipidemia [E78.5] 07/07/2016  . Asthma [J45.909] 07/07/2016  . HTN (hypertension) [I10] 07/06/2016  . Diabetes ([place]) [E11.9] 12/25/2010   Total Time spent with patient: 30 minutes  Past Psychiatric History: see HPI  Past Medical History:  Past Medical History:  Diagnosis Date  . Anxiety   . Asthma   . Diabetes mellitus   . High blood pressure   . Sinus complaint    History reviewed. No pertinent surgical history. Family History: History reviewed. No pertinent family history. Family Psychiatric  History: see HPI Social History:  History  Alcohol Use No     History  Drug Use No    Social History   Social History  . Marital status: Single    Spouse name: [name]  . Number of children: [name]  . Years of education: [name]   Social History Main Topics  . Smoking status: Current Every Day Smoker    Packs/day: 0.50    Types: Cigarettes  . Smokeless tobacco:  Never Used  . Alcohol use No  . Drug use: No  . Sexual activity: Not Currently   Other Topics Concern  . None   Social History Narrative  . None   Additional Social History:    Pain Medications: See MAR Prescriptions: See MAR Over the Counter: See MAR History of alcohol / drug use?: No history of alcohol / drug abuse                    Sleep: Fair  Appetite:  Good  Current Medications: Current Facility-Administered Medications  Medication Dose Route Frequency Provider Last Rate Last Dose  . acetaminophen (TYLENOL) tablet 650 mg  650 mg Oral Q6H PRN Lindon Romp A, NP   650 mg at 02/19/17 2129  . alum & mag hydroxide-simeth (MAALOX/MYLANTA) 200-200-20 MG/5ML suspension 30 mL  30 mL Oral Q4H PRN Lindon Romp A, NP      . cloZAPine (CLOZARIL) tablet 150 mg  150 mg Oral QHS Lindon Romp A, NP   150 mg at 02/20/17 2107  . cloZAPine (CLOZARIL) tablet 25 mg  25 mg Oral Daily Ursula Alert, MD   25 mg at 02/21/17 0814  . FLUoxetine (PROZAC) capsule 40 mg  40 mg Oral Daily Lindon Romp A, NP   40 mg at 02/21/17 0814  . insulin aspart (novoLOG) injection 0-15 Units  0-15 Units Subcutaneous TID WC Ursula Alert, MD   2 Units at 02/21/17 0647  . insulin aspart (novoLOG) injection  0-5 Units  0-5 Units Subcutaneous QHS Ursula Alert, MD   5 Units at 02/20/17 2128  . insulin aspart (novoLOG) injection 5 Units  5 Units Subcutaneous TID WC Lindon Romp A, NP   5 Units at 02/21/17 [phone]  . insulin glargine (LANTUS) injection 40 Units  40 Units Subcutaneous QHS Lindon Romp A, NP   40 Units at 02/20/17 2129  . lisinopril (PRINIVIL,ZESTRIL) tablet 20 mg  20 mg Oral Daily Lindon Romp A, NP   20 mg at 02/21/17 0814  . magnesium hydroxide (MILK OF MAGNESIA) suspension 30 mL  30 mL Oral Daily PRN Lindon Romp A, NP      . metFORMIN (GLUCOPHAGE) tablet 1,000 mg  1,000 mg Oral BID WC Lindon Romp A, NP   1,000 mg at 02/21/17 0814  . montelukast (SINGULAIR) tablet 10 mg  10 mg Oral QHS  Lindon Romp A, NP   10 mg at 02/20/17 2108  . nicotine (NICODERM CQ - dosed in mg/24 hours) patch 21 mg  21 mg Transdermal Daily Eappen, Saramma, MD      . OLANZapine zydis (ZYPREXA) disintegrating tablet 5 mg  5 mg Oral TID PRN Ursula Alert, MD   5 mg at 02/19/17 2133   Or  . OLANZapine (ZYPREXA) injection 5 mg  5 mg Intramuscular TID PRN Eappen, Saramma, MD      . pantoprazole (PROTONIX) EC tablet 40 mg  40 mg Oral Daily Lindon Romp A, NP   40 mg at 02/21/17 0814  . propranolol (INDERAL) tablet 20 mg  20 mg Oral BID Lindon Romp A, NP   20 mg at 02/21/17 0814  . traZODone (DESYREL) tablet 50 mg  50 mg Oral QHS,MR X 1 Lindon Romp A, NP   50 mg at 02/20/17 2108    Lab Results:  Results for orders placed or performed during the hospital encounter of 02/14/17 (from the past 48 hour(s))  Glucose, capillary     Status: None   Collection Time: 02/19/17 11:45 AM  Result Value Ref Range   Glucose-Capillary 66 65 - 99 mg/dL   Comment 1 Notify RN    Comment 2 Document in Chart   Glucose, capillary     Status: Abnormal   Collection Time: 02/19/17  4:58 PM  Result Value Ref Range   Glucose-Capillary 351 (H) 65 - 99 mg/dL  Glucose, capillary     Status: Abnormal   Collection Time: 02/19/17  8:24 PM  Result Value Ref Range   Glucose-Capillary 276 (H) 65 - 99 mg/dL   Comment 1 Notify RN   Glucose, capillary     Status: Abnormal   Collection Time: 02/20/17  6:10 AM  Result Value Ref Range   Glucose-Capillary 101 (H) 65 - 99 mg/dL  Glucose, capillary     Status: None   Collection Time: 02/20/17 11:59 AM  Result Value Ref Range   Glucose-Capillary 79 65 - 99 mg/dL  Glucose, capillary     Status: Abnormal   Collection Time: 02/20/17  5:05 PM  Result Value Ref Range   Glucose-Capillary 347 (H) 65 - 99 mg/dL  Glucose, capillary     Status: Abnormal   Collection Time: 02/20/17  8:29 PM  Result Value Ref Range   Glucose-Capillary 384 (H) 65 - 99 mg/dL   Comment 1 Notify RN   Glucose,  capillary     Status: Abnormal   Collection Time: 02/21/17  6:09 AM  Result Value Ref Range   Glucose-Capillary 128 (H) 65 -  99 mg/dL  CBC with Differential/Platelet     Status: Abnormal   Collection Time: 02/21/17  7:38 AM  Result Value Ref Range   WBC 5.6 4.0 - 10.5 K/uL   RBC 4.38 4.22 - 5.81 MIL/uL   Hemoglobin 13.4 13.0 - 17.0 g/dL   HCT 38.3 (L) 39.0 - 52.0 %   MCV 87.4 78.0 - 100.0 fL   MCH 30.6 26.0 - 34.0 pg   MCHC 35.0 30.0 - 36.0 g/dL   RDW 12.3 11.5 - 15.5 %   Platelets 196 150 - 400 K/uL   Neutrophils Relative % 43 %   Neutro Abs 2.4 1.7 - 7.7 K/uL   Lymphocytes Relative 45 %   Lymphs Abs 2.6 0.7 - 4.0 K/uL   Monocytes Relative 7 %   Monocytes Absolute 0.4 0.1 - 1.0 K/uL   Eosinophils Relative 4 %   Eosinophils Absolute 0.2 0.0 - 0.7 K/uL   Basophils Relative 1 %   Basophils Absolute 0.0 0.0 - 0.1 K/uL    Comment: Performed at [facility], [place] [address] [place], [place] [address]    Blood Alcohol level:  Lab Results  Component Value Date   [facility] <5 01/16/2017   ETH <5 75/91/6384    Metabolic Disorder Labs: Lab Results  Component Value Date   HGBA1C 7.6 (H) 01/19/2017   MPG 171.42 01/19/2017   MPG 249 11/09/2016   Lab Results  Component Value Date   PROLACTIN 24.5 (H) 09/24/2016   PROLACTIN 3.4 (L) 07/07/2016   Lab Results  Component Value Date   CHOL 122 01/19/2017   TRIG 154 (H) 01/19/2017   HDL 36 (L) 01/19/2017   CHOLHDL 3.4 01/19/2017   VLDL 31 01/19/2017   LDLCALC 55 01/19/2017   LDLCALC 59 12/28/2016    Physical Findings: AIMS: Facial and Oral Movements Muscles of Facial Expression: None, normal Lips and Perioral Area: None, normal Jaw: None, normal Tongue: None, normal,Extremity Movements Upper (arms, wrists, hands, fingers): Mild Lower (legs, knees, ankles, toes): None, normal, Trunk Movements Neck, shoulders, hips: None, normal, Overall Severity Severity of abnormal movements (highest score from questions  above): Minimal Incapacitation due to abnormal movements: None, normal Patient's awareness of abnormal movements (rate only patient's report): No Awareness, Dental Status Current problems with teeth and/or dentures?: No Does patient usually wear dentures?: No  CIWA:  CIWA-Ar Total: 4 COWS:     Musculoskeletal: Strength & Muscle Tone: within normal limits Gait & Station: normal Patient leans: [name]  Psychiatric Specialty Exam: Physical Exam  Review of Systems  Unable to perform ROS: Psychiatric disorder  Psychiatric/Behavioral: Positive for depression and hallucinations. Negative for substance abuse and suicidal ideas. The patient is not nervous/anxious and does not have insomnia.     Blood pressure 124/82, pulse 83, temperature 98.1 F (36.7 C), temperature source Oral, resp. rate 16, height 6' (1.829 m), weight 169 lb (76.7 kg).Body mass index is 22.92 kg/m.  General Appearance: Disheveled  Eye Contact:  Poor  Speech:  Blocked  Volume:  Decreased  Mood:  Depressed  Affect:  Blunt  Thought Process:  Coherent but does not elaborate  Orientation:  Full (Time, Place, and Person)  Thought Content:  difficult to assess as patient does not elaborate story  Suicidal Thoughts:  No  Homicidal Thoughts:  No  Memory:  difficult to assess as patient does not elaborate story  Judgement:  Impaired  Insight:  Lacking  Psychomotor Activity:  Decreased  Concentration:  Concentration: Fair and Attention  Span: Fair  Recall:  difficult to assess as patient does not elaborate story  Fund of Knowledge:  difficult to assess as patient does not elaborate story  Language:  Fair  Akathisia:  No  Handed:  Right  AIMS (if indicated):     Assets:  Others:  amenable to treatment  ADL's:  Intact  Cognition:  WNL  Sleep:  Number of Hours: 5.5   Assessment Tony Cunningham is a 41 year old male with schizoaffective disorder, tardive dyskinesia, history of polysubstance abuse (abusing opiates, Lyrica,  Remeron and Flexeril) with recent several inpatient admission/prior ECT, diabetes, hypertension, asthma who presented for SI, HI, psychosis.   # Schizoaffective disorder He is minimally engaged on interview and endorses CAH to kill himself. Will uptitrate clozapine to optimize its effect on schizoaffective disorder. Will continue to monitor cogwheel rigidity. Will continue fluoxetine for depression. He does not have features of catatonia except mutism; will continue to monitor. Noted that he demonstrated big smile when he was asked about his mother, although he does not elaborate it. He was also reported to be more interactive with visitor (likely his mother) per nursing report. Will continue to monitor.   # DM He had poor DM control. He inconsistently take meals (did not eat this morning) and had episode of hypoglycemia. Will order DM consult.   Plan - Uptitrate clozapine 150 mg qhs, 50 mg daily (Confirmed patient registration at [facility]) - EKG reviewed; NSR, QTc 413 msec.  - Discussed with nurse to monitor bowel movement.  - Continue fluoxetine 40 mg daily - Continue propranolol 20 mg BID - Continue Trazodone 50 mg qhs  Treatment Plan Summary: Daily contact with patient to assess and evaluate symptoms and progress in treatment  Norman Clay, MD 02/21/2017, 8:31 AM

## 2017-02-22 DIAGNOSIS — E119 Type 2 diabetes mellitus without complications: Secondary | ICD-10-CM

## 2017-02-22 LAB — GLUCOSE, CAPILLARY
GLUCOSE-CAPILLARY: 152 mg/dL — AB (ref 65–99)
GLUCOSE-CAPILLARY: 128 mg/dL — AB (ref 65–99)
GLUCOSE-CAPILLARY: 239 mg/dL — AB (ref 65–99)
GLUCOSE-CAPILLARY: 231 mg/dL — AB (ref 65–99)

## 2017-02-22 NOTE — Plan of Care (Signed)
Problem: Education: Goal: Knowledge of [facility] Education information/materials will improve Outcome: Progressing Nurse discussed depression/anxiety/coping skills with patient.    

## 2017-02-22 NOTE — [facility] Group Notes (Signed)
LCSW Group Therapy Note   02/22/2017 1:15pm   Type of Therapy and Topic:  Group Therapy:  Overcoming Obstacles   Participation Level:  Did Not Attend   Description of Group:    In this group patients will be encouraged to explore what they see as obstacles to their own wellness and recovery. They will be guided to discuss their thoughts, feelings, and behaviors related to these obstacles. The group will process together ways to cope with barriers, with attention given to specific choices patients can make. Each patient will be challenged to identify changes they are motivated to make in order to overcome their obstacles. This group will be process-oriented, with patients participating in exploration of their own experiences as well as giving and receiving support and challenge from other group members.   Therapeutic Goals: 1. Patient will identify personal and current obstacles as they relate to admission. 2. Patient will identify barriers that currently interfere with their wellness or overcoming obstacles.  3. Patient will identify feelings, thought process and behaviors related to these barriers. 4. Patient will identify two changes they are willing to make to overcome these obstacles:      Summary of Patient Progress      Therapeutic Modalities:   Cognitive Behavioral Therapy Solution Focused Therapy Motivational Interviewing Relapse Prevention Therapy  Ida Rogue, LCSW 02/22/2017 2:17 PM

## 2017-02-22 NOTE — Progress Notes (Signed)
Patient ID: Tony Cunningham, male   DOB: Oct 05, 1975, 41 y.o.   MRN: [id number]  D: Patient coming to staff a lot for diet ginger-ale and snacks tonight. Limited snacks but sugar free drinks given within reason tonight. Patient continues to report not much change with his mood since admission. Still reports some passive SI but contracts and also reports hearing voices at times. Reports that he likes the group home he is at but information questionable since frequent hospital visits.  A: Staff will monitor on q 15 minute checks, follow treatment plan, and give medications as ordered. R: Cooperative on the unit

## 2017-02-22 NOTE — [facility] Counselor (Signed)
Pt is confirmed on [facility] wait list by Vivien Rossetti this AM after getting confirmation that pt. Is not currently receiving ECT.

## 2017-02-22 NOTE — Progress Notes (Signed)
Adult Psychoeducational Group Note  Date:  02/22/2017 Time:  9:03 PM  Group Topic/Focus:  Wrap-Up Group:   The focus of this group is to help patients review their daily goal of treatment and discuss progress on daily workbooks.  Participation Level:  Active  Participation Quality:  Appropriate  Affect:  Appropriate  Cognitive:  Appropriate  Insight: Appropriate  Engagement in Group:  Engaged  Modes of Intervention:  Discussion  Additional Comments:    Meriam Sprague Marcille Blanco 02/22/2017, 9:03 PMThe focus of this group is to help patients establish daily goals to achieve during treatment and discuss how the patient can incorporate goal setting into their daily lives to aide in recovery.

## 2017-02-22 NOTE — Progress Notes (Signed)
Patient was prompted by several staff members this morning to eat his breakfast.   Patient was asked numerous times to eat his lunch but patient continued to lay in bed. Patient laid in bed one hour after being asked to sit up and eat lunch.   Nurse filled out his self inventory sheet after patient gave short answers to questions.  Staff member fed patient his lunch after being asked several times to sit up and eat.  Patient has stayed in bed most of the morning.  Refused to attend groups.  Patient stated "I'm tired, let me be here and sleep."  NP informed of patient's actions.   Patient told nurse this morning that he did not have any SI or HI thoughts, contracts for safety.  Denied visual hallucinations.  Stated bad voices continue, "I'm tired, let me be here and sleep."  Patient's self inventory sheet, patient has fair sleep, sleep medication helpful.  Good appetite, low energy level, poor concentration.  Rated depression, anxiety and hopeless #10.   Denied withdrawals.  Physical problems, feeling very tired.  SI, no plan, contracts for safety.  Denied pain.  Goal is to feel better physically.  Plans to rest.  No discharge plans. Respirations even and unlabored.  No signs/symptoms of pain/distress noted on patient's face/body movements  Safety maintained with 15 minute checks.

## 2017-02-22 NOTE — Progress Notes (Signed)
[name] MD Progress Note  02/22/2017 1:24 PM Tony Cunningham  MRN:  [id number]  Subjective: Mandeep reports, "I'm doing so - so. I'm still hearing voices. Please, can you leave, I'm trying to sleep"    Objective: Tony Cunningham is seen, chart reviewed. Discussed this case with the nursing staff. He is alert, oriented to his name. He is lying down in the bed with his eyes closed. He is verbally responsive to some extent, but is not making any eye contacts. He presents tremulous. He also is  endorsing SIHI, however, unable to elaborate any further. He is a very poor historian and does not elaborate any of his story. He is mostly closing his eyes while lying in the bed. He states "yes" to feeling depressed. He presents with a restricted/flat affects. He endorses SI, HI, VH. He denies chest pain. He denies constipation or dizziness. Staff reports that Tony Cunningham could not feed himself today. He was fed by the nursing staff. He is not attending any group sessions & not visible on the unit. He spends most of his time in bed in his room. Staff continues to provide support & encouragement.  Principal Problem: Schizoaffective disorder, depressive type (HCC) Diagnosis:   Patient Active Problem List   Diagnosis Date Noted  . 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 Schizoaffective disorder, depressive type (HCC) [F25.1] 09/23/2016  . Polysubstance abuse (HCC) [F19.10] 07/17/2016  . Tardive dyskinesia [G24.01] 07/16/2016  . Tobacco use disorder [F17.200] 07/07/2016  . Dyslipidemia [E78.5] 07/07/2016  . Asthma [J45.909] 07/07/2016  . HTN (hypertension) [I10] 07/06/2016  . Diabetes (HCC) [E11.9] 12/25/2010   Total Time spent with patient: 20 minutes  Past Psychiatric History: See H&P  Past Medical History:  Past Medical History:  Diagnosis Date  . Anxiety   . Asthma   . Diabetes mellitus   . High blood pressure   . Sinus complaint    History reviewed. No pertinent surgical history.  Family  History: History reviewed. No pertinent family history.  Family Psychiatric  History: See H&P  Social History:  History  Alcohol Use No     History  Drug Use No    Social History   Social History  . Marital status: Single    Spouse name: [name]  . Number of children: [name]  . Years of education: [name]   Social History Main Topics  . Smoking status: Current Every Day Smoker    Packs/day: 0.50    Types: Cigarettes  . Smokeless tobacco: Never Used  . Alcohol use No  . Drug use: No  . Sexual activity: Not Currently   Other Topics Concern  . None   Social History Narrative  . None   Additional Social History:    Pain Medications: See MAR Prescriptions: See MAR Over the Counter: See MAR History of alcohol / drug use?: No history of alcohol / drug abuse  Sleep: Fair  Appetite:  Good  Current Medications: Current Facility-Administered Medications  Medication Dose Route Frequency Provider Last Rate Last Dose  . acetaminophen (TYLENOL) tablet 650 mg  650 mg Oral Q6H PRN Nira Conn A, NP   650 mg at 02/19/17 2129  . alum & mag hydroxide-simeth (MAALOX/MYLANTA) 200-200-20 MG/5ML suspension 30 mL  30 mL Oral Q4H PRN Nira Conn A, NP      . cloZAPine (CLOZARIL) tablet 150 mg  150 mg Oral QHS Nira Conn A, NP   150 mg at 02/21/17 2127  . cloZAPine (CLOZARIL) tablet 50 mg  50 mg  Oral Daily Hisada, Barbee Cough, MD   50 mg at 02/22/17 0800  . FLUoxetine (PROZAC) capsule 40 mg  40 mg Oral Daily Nira Conn A, NP   40 mg at 02/22/17 0800  . insulin aspart (novoLOG) injection 0-15 Units  0-15 Units Subcutaneous TID WC Jomarie Longs, MD   3 Units at 02/22/17 0629  . insulin aspart (novoLOG) injection 0-5 Units  0-5 Units Subcutaneous QHS Jomarie Longs, MD   2 Units at 02/21/17 2129  . insulin aspart (novoLOG) injection 5 Units  5 Units Subcutaneous TID WC Jerald Kief, MD   5 Units at 02/22/17 [phone]  . insulin glargine (LANTUS) injection 40 Units  40 Units Subcutaneous QHS Nira Conn A, NP   40 Units at 02/21/17 2128  . lisinopril (PRINIVIL,ZESTRIL) tablet 20 mg  20 mg Oral Daily Nira Conn A, NP   20 mg at 02/22/17 0759  . magnesium hydroxide (MILK OF MAGNESIA) suspension 30 mL  30 mL Oral Daily PRN Nira Conn A, NP      . metFORMIN (GLUCOPHAGE) tablet 1,000 mg  1,000 mg Oral BID WC Nira Conn A, NP   1,000 mg at 02/22/17 0800  . montelukast (SINGULAIR) tablet 10 mg  10 mg Oral QHS Nira Conn A, NP   10 mg at 02/21/17 2127  . nicotine (NICODERM CQ - dosed in mg/24 hours) patch 21 mg  21 mg Transdermal Daily Eappen, Saramma, MD      . OLANZapine zydis (ZYPREXA) disintegrating tablet 5 mg  5 mg Oral TID PRN Jomarie Longs, MD   5 mg at 02/19/17 2133   Or  . OLANZapine (ZYPREXA) injection 5 mg  5 mg Intramuscular TID PRN Eappen, Saramma, MD      . pantoprazole (PROTONIX) EC tablet 40 mg  40 mg Oral Daily Nira Conn A, NP   40 mg at 02/22/17 0800  . propranolol (INDERAL) tablet 20 mg  20 mg Oral BID Nira Conn A, NP   20 mg at 02/22/17 0800  . traZODone (DESYREL) tablet 50 mg  50 mg Oral QHS,MR X 1 Nira Conn A, NP   50 mg at 02/21/17 2127   Lab Results:  Results for orders placed or performed during the hospital encounter of 02/14/17 (from the past 48 hour(s))  Glucose, capillary     Status: Abnormal   Collection Time: 02/20/17  5:05 PM  Result Value Ref Range   Glucose-Capillary 347 (H) 65 - 99 mg/dL  Glucose, capillary     Status: Abnormal   Collection Time: 02/20/17  8:29 PM  Result Value Ref Range   Glucose-Capillary 384 (H) 65 - 99 mg/dL   Comment 1 Notify RN   Glucose, capillary     Status: Abnormal   Collection Time: 02/21/17  6:09 AM  Result Value Ref Range   Glucose-Capillary 128 (H) 65 - 99 mg/dL  CBC with Differential/Platelet     Status: Abnormal   Collection Time: 02/21/17  7:38 AM  Result Value Ref Range   WBC 5.6 4.0 - 10.5 K/uL   RBC 4.38 4.22 - 5.81 MIL/uL   Hemoglobin 13.4 13.0 - 17.0 g/dL   HCT 23.5 (L) 36.1 - 44.3 %   MCV  87.4 78.0 - 100.0 fL   MCH 30.6 26.0 - 34.0 pg   MCHC 35.0 30.0 - 36.0 g/dL   RDW 15.4 00.8 - 67.6 %   Platelets 196 150 - 400 K/uL   Neutrophils Relative % 43 %   Neutro  Abs 2.4 1.7 - 7.7 K/uL   Lymphocytes Relative 45 %   Lymphs Abs 2.6 0.7 - 4.0 K/uL   Monocytes Relative 7 %   Monocytes Absolute 0.4 0.1 - 1.0 K/uL   Eosinophils Relative 4 %   Eosinophils Absolute 0.2 0.0 - 0.7 K/uL   Basophils Relative 1 %   Basophils Absolute 0.0 0.0 - 0.1 K/uL    Comment: Performed at [facility], [address], [place], [place] [address]  [place]     Status: None   Collection Time: 02/21/17  7:38 AM  Result Value Ref Range   Total CK 56 49 - 397 U/L    Comment: Performed at [facility], [address] [place], [place] [address]  Glucose, capillary     Status: Abnormal   Collection Time: 02/21/17 11:52 AM  Result Value Ref Range   Glucose-Capillary 45 (L) 65 - 99 mg/dL  Glucose, capillary     Status: Abnormal   Collection Time: 02/21/17 12:11 PM  Result Value Ref Range   Glucose-Capillary 56 (L) 65 - 99 mg/dL  Glucose, capillary     Status: Abnormal   Collection Time: 02/21/17  1:00 PM  Result Value Ref Range   Glucose-Capillary 171 (H) 65 - 99 mg/dL  Glucose, capillary     Status: Abnormal   Collection Time: 02/21/17  5:08 PM  Result Value Ref Range   Glucose-Capillary 226 (H) 65 - 99 mg/dL  Glucose, capillary     Status: Abnormal   Collection Time: 02/21/17  8:35 PM  Result Value Ref Range   Glucose-Capillary 230 (H) 65 - 99 mg/dL  Glucose, capillary     Status: Abnormal   Collection Time: 02/22/17  6:16 AM  Result Value Ref Range   Glucose-Capillary 152 (H) 65 - 99 mg/dL   Comment 1 Notify RN    Comment 2 Document in Chart   Glucose, capillary     Status: Abnormal   Collection Time: 02/22/17 11:56 AM  Result Value Ref Range   Glucose-Capillary 128 (H) 65 - 99 mg/dL   Comment 1 Notify RN    Comment 2 Document in Chart    Blood Alcohol  level:  Lab Results  Component Value Date   ETH <5 01/16/2017   ETH <5 12/21/2016   Metabolic Disorder Labs: Lab Results  Component Value Date   HGBA1C 7.6 (H) 01/19/2017   MPG 171.42 01/19/2017   MPG 249 11/09/2016   Lab Results  Component Value Date   PROLACTIN 24.5 (H) 09/24/2016   PROLACTIN 3.4 (L) 07/07/2016   Lab Results  Component Value Date   CHOL 122 01/19/2017   TRIG 154 (H) 01/19/2017   HDL 36 (L) 01/19/2017   CHOLHDL 3.4 01/19/2017   VLDL 31 01/19/2017   LDLCALC 55 01/19/2017   LDLCALC 59 12/28/2016   Physical Findings: AIMS: Facial and Oral Movements Muscles of Facial Expression: None, normal Lips and Perioral Area: None, normal Jaw: None, normal Tongue: None, normal,Extremity Movements Upper (arms, wrists, hands, fingers): Mild Lower (legs, knees, ankles, toes): None, normal, Trunk Movements Neck, shoulders, hips: None, normal, Overall Severity Severity of abnormal movements (highest score from questions above): Minimal Incapacitation due to abnormal movements: None, normal Patient's awareness of abnormal movements (rate only patient's report): No Awareness, Dental Status Current problems with teeth and/or dentures?: No Does patient usually wear dentures?: No  CIWA:  CIWA-Ar Total: 4 COWS:     Musculoskeletal: Strength & Muscle Tone: within normal  limits Gait & Station: normal Patient leans: [name]  Psychiatric Specialty Exam: Physical Exam  Review of Systems  Unable to perform ROS: Psychiatric disorder  Psychiatric/Behavioral: Positive for depression and hallucinations. Negative for substance abuse and suicidal ideas. The patient is not nervous/anxious and does not have insomnia.     Blood pressure 103/71, pulse (!) 104, temperature 97.9 F (36.6 C), temperature source Oral, resp. rate 16, height 6' (1.829 m), weight 76.7 kg (169 lb).Body mass index is 22.92 kg/m.  General Appearance: Disheveled  Eye Contact:  Poor  Speech:  Blocked  Volume:   Decreased  Mood:  Depressed  Affect:  Blunt  Thought Process:  Coherent but does not elaborate  Orientation:  Full (Time, Place, and Person)  Thought Content:  difficult to assess as patient does not elaborate story  Suicidal Thoughts:  No  Homicidal Thoughts:  No  Memory:  difficult to assess as patient does not elaborate story  Judgement:  Impaired  Insight:  Lacking  Psychomotor Activity:  Decreased  Concentration:  Concentration: Fair and Attention Span: Fair  Recall:  difficult to assess as patient does not elaborate story  Fund of Knowledge:  difficult to assess as patient does not elaborate story  Language:  Fair  Akathisia:  No  Handed:  Right  AIMS (if indicated):     Assets:  Others:  amenable to treatment  ADL's:  Intact  Cognition:  WNL  Sleep:  Number of Hours: 6.75   Assessment Tony Cunningham is a 41 year old male with schizoaffective disorder, tardive dyskinesia, history of polysubstance abuse (abusing opiates, Lyrica, Remeron and Flexeril) with recent several inpatient admission/prior ECT, diabetes, hypertension, asthma who presented for SI, HI, psychosis.   # Schizoaffective disorder He is minimally engaged on interview and endorses CAH to kill himself. Will uptitrate clozapine to optimize its effect on schizoaffective disorder. Will continue to monitor cogwheel rigidity. Will continue fluoxetine for depression. He does not have features of catatonia except mutism; will continue to monitor. Noted that he demonstrated big smile when he was asked about his mother, although he does not elaborate it. He was also reported to be more interactive with visitor (likely his mother) per nursing report. Will continue to monitor.   # DM He had poor DM control. He inconsistently take meals (did not eat this morning) and had episode of hypoglycemia. Will order DM consult.   Treatment Summary: Daily contact with patient to assess and evaluate symptoms and progress in  treatment.  Will continue today 03/24/17 plan as below except where it is noted.  Plan - Continue clozapine 150 mg qhs, 50 mg daily (Confirmed patient registration at [facility]) - EKG reviewed; NSR, QTc 413 msec.  - Discussed with nurse to monitor bowel movement.  - Continue fluoxetine 40 mg daily for depression - Continue propranolol 20 mg BID for anxiety/fine tremors. - Continue Trazodone 50 mg q hs for insomnia.   Tony Kava, NP, PMHNP, FNP-BC. 02/22/2017, 1:24 PMPatient ID: [name], male   DOB: 01/02/1976, 41 y.o.   MRN: [id number]

## 2017-02-22 NOTE — Plan of Care (Signed)
Problem: Education: Goal: Utilization of techniques to improve thought processes will improve Outcome: Progressing Nurse discussed depression/anxiety/coping skills with patient.    

## 2017-02-22 NOTE — Progress Notes (Signed)
Inpatient Diabetes Program Recommendations  AACE/ADA: New Consensus Statement on Inpatient Glycemic Control (2015)  Target Ranges:  Prepandial:   less than 140 mg/dL      Peak postprandial:   less than 180 mg/dL (1-2 hours)      Critically ill patients:  140 - 180 mg/dL   Results for Tony Cunningham, Tony Cunningham (MRN [id number]) as of 02/22/2017 08:48  Ref. Range 02/19/2017 05:56 02/19/2017 11:45 02/19/2017 16:58 02/19/2017 20:24  Glucose-Capillary Latest Ref Range: 65 - 99 mg/dL 366 (H)  0 units Novolog 66  0 units Novolog 351 (H)  20 units Novolog 276 (H)  3 units Novolog +  40 units Lantus   Results for Tony Cunningham, Tony Cunningham (MRN [id number]) as of 02/22/2017 08:48  Ref. Range 02/20/2017 06:10 02/20/2017 11:59 02/20/2017 17:05 02/20/2017 20:29  Glucose-Capillary Latest Ref Range: 65 - 99 mg/dL 035 (H)  0 units Novolog 79  0 units Novolog 347 (H)  16 units Novolog 384 (H)  5 units Novolog +   40 units Lantus   Results for Tony Cunningham, Tony Cunningham (MRN [id number]) as of 02/22/2017 08:48  Ref. Range 02/21/2017 06:09 02/21/2017 11:52 02/21/2017 12:11 02/21/2017 13:00 02/21/2017 17:08 02/21/2017 20:35  Glucose-Capillary Latest Ref Range: 65 - 99 mg/dL 170 (H)  7 units Novolog 45 (L) 56 (L) 171 (H)  5 units Novolog 226 (H)  5 units Novolog 230 (H)  2 units Novolog +   40 units Lantus   Results for Tony Cunningham, Tony Cunningham (MRN [id number]) as of 02/22/2017 08:48  Ref. Range 02/22/2017 06:16  Glucose-Capillary Latest Ref Range: 65 - 99 mg/dL 759 (H)  8 units Novolog    Home DM Meds: Lantus 45 units QHS       Novolog 6 units TID       Metformin 1000 mg BID  Current Insulin Orders: Lantus 40 units QHS      Novolog Moderate Correction Scale/ SSI (0-15 units) TID AC + HS      Novolog 5 units TID with meals      Metformin 1000 mg BID      -Note patient having Hypoglycemic events followed by Hyperglycemia.  -Fasting glucose levels have been stable.  Looks as if patient is getting an appropriate amount of basal insulin  (Lantus).     MD- Note that Dr. Rhona Leavens placed specific parameters on Novolog Meal Coverage orders yesterday (09/30) to only give 2 units of Novolog if patient eats <50% of meal and to hold dose if patient does not eat meal.  Agree with these modifications.  Will speak with nursing staff to make sure they understand the current Novolog orders.      --Will follow patient during hospitalization--  Ambrose Finland RN, MSN, CDE Diabetes Coordinator Inpatient Glycemic Control Team Team Pager: [phone] (8a-5p)

## 2017-02-22 NOTE — Progress Notes (Signed)
Recreation Therapy Notes  Date: 02/22/17 Time: 1000 Location: 500 [address] Dayroom  Group Topic: Coping Skills  Goal Area(s) Addresses:  Patients will be able to identify positive coping skills. Patients will be able to identify benefits of using coping skills post d/c.  Intervention:  Magazines, scissors, glue sticks, Holiday representative paper  Activity: Coping Skills.  Patients were given a worksheet divided into five categories (diversions, social, tension releaser, cognitive and physical.  Patients were to cut out and identify at least 2 coping skills for each section from the magazines provided.  Education: Pharmacologist, Building control surveyor.   Education Outcome: Acknowledges understanding/In group clarification offered/Needs additional education.   Clinical Observations/Feedback: Pt did not attend group.  [name], LRT/CTRS         [name], [name] A 02/22/2017 3:21 PM

## 2017-02-23 LAB — GLUCOSE, CAPILLARY
GLUCOSE-CAPILLARY: 94 mg/dL (ref 65–99)
GLUCOSE-CAPILLARY: 360 mg/dL — AB (ref 65–99)
GLUCOSE-CAPILLARY: 309 mg/dL — AB (ref 65–99)
Glucose-Capillary: 130 mg/dL — ABNORMAL HIGH (ref 65–99)

## 2017-02-23 NOTE — Tx Team (Signed)
Interdisciplinary Treatment and Diagnostic Plan Update  02/26/2017 Time of Session: 8:43 AM  Tony Cunningham MRN: [id number]  Principal Diagnosis: Schizoaffective disorder, depressive type (HCC)  Secondary Diagnoses: Principal Problem:   Schizoaffective disorder, depressive type (HCC) Active Problems:   Diabetes (HCC)   Neuroleptic-induced Parkinsonism (HCC)   Current Medications:  Current Facility-Administered Medications  Medication Dose Route Frequency Provider Last Rate Last Dose  . acetaminophen (TYLENOL) tablet 650 mg  650 mg Oral Q6H PRN Nira Conn A, NP   650 mg at 02/19/17 2129  . alum & mag hydroxide-simeth (MAALOX/MYLANTA) 200-200-20 MG/5ML suspension 30 mL  30 mL Oral Q4H PRN Nira Conn A, NP      . cloZAPine (CLOZARIL) tablet 150 mg  150 mg Oral QHS Nira Conn A, NP   150 mg at 02/25/17 2142  . cloZAPine (CLOZARIL) tablet 50 mg  50 mg Oral Daily Neysa Hotter, MD   50 mg at 02/25/17 0808  . FLUoxetine (PROZAC) capsule 40 mg  40 mg Oral Daily Nira Conn A, NP   40 mg at 02/25/17 3500  . insulin aspart (novoLOG) injection 0-15 Units  0-15 Units Subcutaneous TID WC Jomarie Longs, MD   5 Units at 02/25/17 1811  . insulin aspart (novoLOG) injection 0-5 Units  0-5 Units Subcutaneous QHS Jomarie Longs, MD   3 Units at 02/25/17 2147  . insulin aspart (novoLOG) injection 5 Units  5 Units Subcutaneous TID WC Jerald Kief, MD   5 Units at 02/25/17 1809  . insulin glargine (LANTUS) injection 40 Units  40 Units Subcutaneous QHS Nira Conn A, NP   40 Units at 02/25/17 2147  . lisinopril (PRINIVIL,ZESTRIL) tablet 20 mg  20 mg Oral Daily Nira Conn A, NP   20 mg at 02/25/17 0809  . magnesium hydroxide (MILK OF MAGNESIA) suspension 30 mL  30 mL Oral Daily PRN Nira Conn A, NP      . metFORMIN (GLUCOPHAGE) tablet 1,000 mg  1,000 mg Oral BID WC Nira Conn A, NP   1,000 mg at 02/25/17 1700  . montelukast (SINGULAIR) tablet 10 mg  10 mg Oral QHS Nira Conn A, NP   10 mg at  02/25/17 2142  . nicotine (NICODERM CQ - dosed in mg/24 hours) patch 21 mg  21 mg Transdermal Daily Eappen, Saramma, MD      . OLANZapine zydis (ZYPREXA) disintegrating tablet 5 mg  5 mg Oral TID PRN Jomarie Longs, MD   5 mg at 02/19/17 2133   Or  . OLANZapine (ZYPREXA) injection 5 mg  5 mg Intramuscular TID PRN Eappen, Saramma, MD      . pantoprazole (PROTONIX) EC tablet 40 mg  40 mg Oral Daily Nira Conn A, NP   40 mg at 02/25/17 0809  . propranolol (INDERAL) tablet 20 mg  20 mg Oral BID Nira Conn A, NP   20 mg at 02/25/17 1700  . traZODone (DESYREL) tablet 50 mg  50 mg Oral QHS,MR X 1 Nira Conn A, NP   50 mg at 02/25/17 2205    PTA Medications: Prescriptions Prior to Admission  Medication Sig Dispense Refill Last Dose  . amantadine (SYMMETREL) 100 MG capsule Take 100 mg by mouth 2 (two) times daily.   02/11/2017  . citalopram (CELEXA) 20 MG tablet Take 20 mg by mouth daily.   02/11/2017  . cloZAPine (CLOZARIL) 50 MG tablet Take 3 tablets (150 mg total) by mouth at bedtime. 90 tablet 1 02/11/2017  . FLUoxetine (PROZAC) 40 MG capsule  Take 1 capsule (40 mg total) by mouth daily. 30 capsule 1 02/11/2017  . insulin aspart (NOVOLOG) 100 UNIT/ML injection Inject 5 Units into the skin 3 (three) times daily with meals. (Patient taking differently: Inject 6 Units into the skin 3 (three) times daily with meals. ) 10 mL 2 02/11/2017  . insulin glargine (LANTUS) 100 UNIT/ML injection Inject 0.4 mLs (40 Units total) into the skin at bedtime. (Patient taking differently: Inject 45 Units into the skin at bedtime. ) 10 mL 2 02/11/2017  . lisinopril (PRINIVIL,ZESTRIL) 20 MG tablet Take 1 tablet (20 mg total) by mouth daily. 30 tablet 1 02/11/2017  . metFORMIN (GLUCOPHAGE) 1000 MG tablet Take 1 tablet (1,000 mg total) by mouth 2 (two) times daily with a meal. 60 tablet 1 02/11/2017  . pantoprazole (PROTONIX) 40 MG tablet Take 1 tablet (40 mg total) by mouth daily. 30 tablet 1 02/11/2017  . propranolol  (INDERAL) 20 MG tablet Take 1 tablet (20 mg total) by mouth 2 (two) times daily. 60 tablet 1 02/11/2017  . simvastatin (ZOCOR) 20 MG tablet Take 20 mg by mouth daily.   02/11/2017  . temazepam (RESTORIL) 7.5 MG capsule Take 7.5 mg by mouth at bedtime.   02/11/2017  . montelukast (SINGULAIR) 10 MG tablet Take 1 tablet (10 mg total) by mouth at bedtime. (Patient not taking: Reported on 02/15/2017) 30 tablet 1 Not Taking at Unknown time    Patient Stressors: Marital or family conflict Other: blood sugar  Patient Strengths: General fund of knowledge Motivation for treatment/growth  Treatment Modalities: Medication Management, Group therapy, Case management,  1 to 1 session with clinician, Psychoeducation, Recreational therapy.   Physician Treatment Plan for Primary Diagnosis: Schizoaffective disorder, depressive type (HCC) Long Term Goal(s): Improvement in symptoms so as ready for discharge  Short Term Goals: Ability to verbalize feelings will improve Ability to demonstrate self-control will improve Compliance with prescribed medications will improve Ability to verbalize feelings will improve Ability to identify and develop effective coping behaviors will improve Compliance with prescribed medications will improve  Medication Management: Evaluate patient's response, side effects, and tolerance of medication regimen.  Therapeutic Interventions: 1 to 1 sessions, Unit Group sessions and Medication administration.  Evaluation of Outcomes: Progressing   9/27: Patient with schizoaffective do , continues to have thought blocking , negative sx as well as HI. Pt continues to need a lot of support to take care of his ADLs as well as his medications.   Pt will benefit from being in a supervised, structured place - since he has been in and out of long hospital stays since May 2018 .  He has failed multiple trials of medications as well as recent ECT treatments( this month).   Increase  Clozaril to 25 mg po daily and 150 mg po qhs for psychosis. CBC with diff as per Clozaril therapy needs - Pharmacy consult placed- Results for Tony Cunningham, Tony Cunningham (MRN [id number]) as of 02/17/2017 11:46  Prozac 40 mg po daily for affective sx. Propranolol 20 mg po bid for anxiety, tachycardia. Trazodone 50 mg po qhs prn for insomnia. Pharmacy consult for clozaril therapy. Repeat BMP for hx of hyponatremia - WNL. Consulted Dr.Clapac regarding ECT treatments - per Dr.Clapac - pt  will not be a good candidate at this time. Will refer to [facility] for further management. Will give PPD today 02/17/2017 for possible [facility] referral.  10/2: Continue clozapine 150 mg qhs, 50 mg daily (Confirmed patient registration at [facility]) - EKG reviewed; NSR, QTc 413 msec.  -  Discussed with nurse to monitor bowel movement.  - Continue fluoxetine 40 mg daily for depression - Continue propranolol 20 mg BID for anxiety/fine tremors. - Continue Trazodone 50 mg q hs for insomnia.   Physician Treatment Plan for Secondary Diagnosis: Principal Problem:   Schizoaffective disorder, depressive type (HCC) Active Problems:   Diabetes (HCC)   Neuroleptic-induced Parkinsonism (HCC)   Long Term Goal(s): Improvement in symptoms so as ready for discharge  Short Term Goals: Ability to verbalize feelings will improve Ability to demonstrate self-control will improve Compliance with prescribed medications will improve Ability to verbalize feelings will improve Ability to identify and develop effective coping behaviors will improve Compliance with prescribed medications will improve  Medication Management: Evaluate patient's response, side effects, and tolerance of medication regimen.  Therapeutic Interventions: 1 to 1 sessions, Unit Group sessions and Medication administration.  Evaluation of Outcomes: Progressing   RN Treatment Plan for Primary Diagnosis: Schizoaffective disorder, depressive type (HCC) Long Term Goal(s): Knowledge of  disease and therapeutic regimen to maintain health will improve  Short Term Goals: Ability to disclose and discuss suicidal ideas and Ability to identify and develop effective coping behaviors will improve  Medication Management: RN will administer medications as ordered by provider, will assess and evaluate patient's response and provide education to patient for prescribed medication. RN will report any adverse and/or side effects to prescribing provider.  Therapeutic Interventions: 1 on 1 counseling sessions, Psychoeducation, Medication administration, Evaluate responses to treatment, Monitor vital signs and CBGs as ordered, Perform/monitor CIWA, COWS, AIMS and Fall Risk screenings as ordered, Perform wound care treatments as ordered.  Evaluation of Outcomes: Progressing   LCSW Treatment Plan for Primary Diagnosis: Schizoaffective disorder, depressive type (HCC) Long Term Goal(s): Safe transition to appropriate next level of care at discharge, Engage patient in therapeutic group addressing interpersonal concerns.  Short Term Goals: Engage patient in aftercare planning with referrals and resources  Therapeutic Interventions: Assess for all discharge needs, 1 to 1 time with Social worker, Explore available resources and support systems, Assess for adequacy in community support network, Educate family and significant other(s) on suicide prevention, Complete Psychosocial Assessment, Interpersonal group therapy.  Evaluation of Outcomes: Progressing,  Referred pt to both group home and [facility]. Additional labs-will send when completed.  Pt and I are to call mother on weekend when mother is available with help of interpreter.   10/2:  Pt on [facility] wait list.  Visit tomorrow with [facility] representative for possible placement.  Have been unable to reach pt's mother via phone with pt.  Pt told me yesterday that she is not available until after 5PM.  Possible treatment team meeting coordinated by [facility] on Thursday at 11:00  10/5:  Remains on [facility] wait list.  Accepted at [facility] in [place].  CSW to update care coordinator and mother re continued discharge planning to community.    Progress in Treatment: Attending groups:Intermittently Participating in groups: Minimally Taking medication as prescribed: Yes Toleration medication: Yes, no side effects reported at this time Family/Significant other contact made: No Patient understands diagnosis: No Limited insight Discussing patient identified problems/goals with staff: Yes Medical problems stabilized or resolved: Yes Denies suicidal/homicidal ideation: Yes Issues/concerns per patient self-inventory: None Other: [name]  New problem(s) identified: None identified at this time.   New Short Term/Long Term Goal(s): Chose to not participate in treatment team process. 10/5:  Patient referred for group home placement, [facility] accepted patient.  CSW to contact mother to discuss visitation options.  Will  arrange aftercare in area of group home, CSW to discuss options for transportation  Discharge Plan or Barriers: Patient remaining agreeable to group home placement, weekend CSW to discuss options for visitation and contact w mother and update [facility] care coordinator.    Reason for Continuation of Hospitalization:  Medication stabilization  Estimated Length of Stay: 03/01/17  Attendees: Patient: declined to participate 02/26/2017  8:43 AM  Physician: Nehemiah Massed, MD 02/26/2017  8:43 AM  Nursing: Erskine Squibb RN 02/26/2017  8:43 AM  RN Care Manager:  02/26/2017  8:43 AM  Social Worker: Santa Genera LCSW 02/26/2017  8:43 AM  Recreational Therapist:  02/26/2017  8:43 AM  Other:  02/26/2017  8:43 AM  Other:  02/26/2017  8:43 AM    Scribe for Treatment Team:  Santa Genera LCSW 02/26/2017 8:43 AM

## 2017-02-23 NOTE — Progress Notes (Signed)
Adult Psychoeducational Group Note  Date:  02/23/2017 Time:  8:37 PM  Group Topic/Focus:  Wrap-Up Group:   The focus of this group is to help patients review their daily goal of treatment and discuss progress on daily workbooks.  Participation Level:  Active  Participation Quality:  Appropriate  Affect:  Appropriate  Cognitive:  Alert  Insight: Appropriate  Engagement in Group:  Engaged  Modes of Intervention:  Discussion  Additional Comments:  Patient stated having an okay day. Patient's goal for today was to keep up and smile.  [name] [name] 02/23/2017, 8:37 PM

## 2017-02-23 NOTE — Progress Notes (Signed)
[facility] MD Progress Note  02/23/2017 3:08 PM Kaiden Pech  MRN:  [id number]  Objective: [place] was evaluated  by NP and MD. Patient seen resting in bed reports he has been attending group sessions on today. States his mood "so so". Fionn continues to express concerns with restarting  ECT therapy. Patient reports he is not interested in going to a group home at this time. Reports suicidal ideation that are chronic in nature, however patient is able to contract for safety.  Reports he is medication compliant and tolerating medications well. CSW to continue with disposition -[facility]. Support, Encouragement and Reassurances was provided.    Principal Problem: Schizoaffective disorder, depressive type (HCC) Diagnosis:   Patient Active Problem List   Diagnosis Date Noted  . 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 Schizoaffective disorder, depressive type (HCC) [F25.1] 09/23/2016  . Polysubstance abuse (HCC) [F19.10] 07/17/2016  . Tardive dyskinesia [G24.01] 07/16/2016  . Tobacco use disorder [F17.200] 07/07/2016  . Dyslipidemia [E78.5] 07/07/2016  . Asthma [J45.909] 07/07/2016  . HTN (hypertension) [I10] 07/06/2016  . Diabetes (HCC) [E11.9] 12/25/2010   Total Time spent with patient: 20 minutes  Past Psychiatric History: See H&P  Past Medical History:  Past Medical History:  Diagnosis Date  . Anxiety   . Asthma   . Diabetes mellitus   . High blood pressure   . Sinus complaint    History reviewed. No pertinent surgical history.  Family History: History reviewed. No pertinent family history.  Family Psychiatric  History: See H&P  Social History:  History  Alcohol Use No     History  Drug Use No    Social History   Social History  . Marital status: Single    Spouse name: [name]  . Number of children: [name]  . Years of education: [name]   Social History Main Topics  . Smoking status: Current Every Day Smoker    Packs/day: 0.50    Types: Cigarettes  . Smokeless  tobacco: Never Used  . Alcohol use No  . Drug use: No  . Sexual activity: Not Currently   Other Topics Concern  . None   Social History Narrative  . None   Additional Social History:    Pain Medications: See MAR Prescriptions: See MAR Over the Counter: See MAR History of alcohol / drug use?: No history of alcohol / drug abuse  Sleep: Fair  Appetite:  Good  Current Medications: Current Facility-Administered Medications  Medication Dose Route Frequency Provider Last Rate Last Dose  . acetaminophen (TYLENOL) tablet 650 mg  650 mg Oral Q6H PRN Nira Conn A, NP   650 mg at 02/19/17 2129  . alum & mag hydroxide-simeth (MAALOX/MYLANTA) 200-200-20 MG/5ML suspension 30 mL  30 mL Oral Q4H PRN Nira Conn A, NP      . cloZAPine (CLOZARIL) tablet 150 mg  150 mg Oral QHS Nira Conn A, NP   150 mg at 02/22/17 2133  . cloZAPine (CLOZARIL) tablet 50 mg  50 mg Oral Daily Neysa Hotter, MD   50 mg at 02/23/17 9379  . FLUoxetine (PROZAC) capsule 40 mg  40 mg Oral Daily Nira Conn A, NP   40 mg at 02/23/17 0240  . insulin aspart (novoLOG) injection 0-15 Units  0-15 Units Subcutaneous TID WC Eappen, Saramma, MD   0 Units at 02/23/17 1254  . insulin aspart (novoLOG) injection 0-5 Units  0-5 Units Subcutaneous QHS Jomarie Longs, MD   2 Units at 02/22/17 2121  . insulin aspart (novoLOG) injection  5 Units  5 Units Subcutaneous TID WC Jerald Kief, MD   5 Units at 02/23/17 1254  . insulin glargine (LANTUS) injection 40 Units  40 Units Subcutaneous QHS Nira Conn A, NP   40 Units at 02/22/17 2119  . lisinopril (PRINIVIL,ZESTRIL) tablet 20 mg  20 mg Oral Daily Nira Conn A, NP   20 mg at 02/23/17 1610  . magnesium hydroxide (MILK OF MAGNESIA) suspension 30 mL  30 mL Oral Daily PRN Nira Conn A, NP      . metFORMIN (GLUCOPHAGE) tablet 1,000 mg  1,000 mg Oral BID WC Nira Conn A, NP   1,000 mg at 02/23/17 0821  . montelukast (SINGULAIR) tablet 10 mg  10 mg Oral QHS Nira Conn A, NP   10  mg at 02/22/17 2132  . nicotine (NICODERM CQ - dosed in mg/24 hours) patch 21 mg  21 mg Transdermal Daily Eappen, Saramma, MD      . OLANZapine zydis (ZYPREXA) disintegrating tablet 5 mg  5 mg Oral TID PRN Jomarie Longs, MD   5 mg at 02/19/17 2133   Or  . OLANZapine (ZYPREXA) injection 5 mg  5 mg Intramuscular TID PRN Eappen, Saramma, MD      . pantoprazole (PROTONIX) EC tablet 40 mg  40 mg Oral Daily Nira Conn A, NP   40 mg at 02/23/17 0821  . propranolol (INDERAL) tablet 20 mg  20 mg Oral BID Nira Conn A, NP   20 mg at 02/23/17 9604  . traZODone (DESYREL) tablet 50 mg  50 mg Oral QHS,MR X 1 Nira Conn A, NP   50 mg at 02/22/17 2246   Lab Results:  Results for orders placed or performed during the hospital encounter of 02/14/17 (from the past 48 hour(s))  Glucose, capillary     Status: Abnormal   Collection Time: 02/21/17  5:08 PM  Result Value Ref Range   Glucose-Capillary 226 (H) 65 - 99 mg/dL  Glucose, capillary     Status: Abnormal   Collection Time: 02/21/17  8:35 PM  Result Value Ref Range   Glucose-Capillary 230 (H) 65 - 99 mg/dL  Glucose, capillary     Status: Abnormal   Collection Time: 02/22/17  6:16 AM  Result Value Ref Range   Glucose-Capillary 152 (H) 65 - 99 mg/dL   Comment 1 Notify RN    Comment 2 Document in Chart   Glucose, capillary     Status: Abnormal   Collection Time: 02/22/17 11:56 AM  Result Value Ref Range   Glucose-Capillary 128 (H) 65 - 99 mg/dL   Comment 1 Notify RN    Comment 2 Document in Chart   Glucose, capillary     Status: Abnormal   Collection Time: 02/22/17  4:54 PM  Result Value Ref Range   Glucose-Capillary 239 (H) 65 - 99 mg/dL   Comment 1 Notify RN    Comment 2 Document in Chart   Glucose, capillary     Status: Abnormal   Collection Time: 02/22/17  8:14 PM  Result Value Ref Range   Glucose-Capillary 231 (H) 65 - 99 mg/dL  Glucose, capillary     Status: Abnormal   Collection Time: 02/23/17  6:24 AM  Result Value Ref Range    Glucose-Capillary 130 (H) 65 - 99 mg/dL  Glucose, capillary     Status: None   Collection Time: 02/23/17 11:58 AM  Result Value Ref Range   Glucose-Capillary 94 65 - 99 mg/dL   Blood Alcohol  level:  Lab Results  Component Value Date   ETH <5 01/16/2017   ETH <5 12/21/2016   Metabolic Disorder Labs: Lab Results  Component Value Date   HGBA1C 7.6 (H) 01/19/2017   MPG 171.42 01/19/2017   MPG 249 11/09/2016   Lab Results  Component Value Date   PROLACTIN 24.5 (H) 09/24/2016   PROLACTIN 3.4 (L) 07/07/2016   Lab Results  Component Value Date   CHOL 122 01/19/2017   TRIG 154 (H) 01/19/2017   HDL 36 (L) 01/19/2017   CHOLHDL 3.4 01/19/2017   VLDL 31 01/19/2017   LDLCALC 55 01/19/2017   LDLCALC 59 12/28/2016   Physical Findings: AIMS: Facial and Oral Movements Muscles of Facial Expression: None, normal Lips and Perioral Area: None, normal Jaw: None, normal Tongue: None, normal,Extremity Movements Upper (arms, wrists, hands, fingers): None, normal Lower (legs, knees, ankles, toes): None, normal, Trunk Movements Neck, shoulders, hips: None, normal, Overall Severity Severity of abnormal movements (highest score from questions above): None, normal Incapacitation due to abnormal movements: None, normal Patient's awareness of abnormal movements (rate only patient's report): No Awareness, Dental Status Current problems with teeth and/or dentures?: No Does patient usually wear dentures?: No  CIWA:  CIWA-Ar Total: 1 COWS:  COWS Total Score: 1  Musculoskeletal: Strength & Muscle Tone: within normal limits Gait & Station: normal Patient leans: [name]  Psychiatric Specialty Exam: Physical Exam  Constitutional: He appears well-developed.  Respiratory: Effort normal.  Neurological: He is alert.  Psychiatric: He has a normal mood and affect. His behavior is normal.    Review of Systems  Unable to perform ROS: Psychiatric disorder  Psychiatric/Behavioral: Positive for depression  and hallucinations. Negative for substance abuse and suicidal ideas. The patient is not nervous/anxious and does not have insomnia.     Blood pressure 111/82, pulse (!) 101, temperature 98.6 F (37 C), temperature source Oral, resp. rate 16, height 6' (1.829 m), weight 76.7 kg (169 lb).Body mass index is 22.92 kg/m.  General Appearance: Disheveled and Guarded  Eye Contact:  Poor  Speech:  Blocked  Volume:  Decreased  Mood:  Depressed  Affect:  Blunt  Thought Process:  Coherent but does not elaborate  Orientation:  Full (Time, Place, and Person)  Thought Content:  Hallucinations: Auditory  Suicidal Thoughts:  No  Homicidal Thoughts:  No  Memory:  Immediate;   Fair Recent;   Fair  Judgement:  Impaired  Insight:  Lacking  Psychomotor Activity:  Decreased-some improvement reports attending group session today  Concentration:  Concentration: Poor  Recall:  difficult to assess as patient does not elaborate story  Fund of Knowledge:  difficult to assess as patient does not elaborate story  Language:  Fair  Akathisia:  No  Handed:  Right  AIMS (if indicated):     Assets:  Desire for Improvement  ADL's:  Intact  Cognition:  WNL  Sleep:  Number of Hours: 5.75    I agree with current treatment plan on 02/23/2017, Patient seen face-to-face for psychiatric evaluation follow-up, chart reviewed and case discussed with the MD Verna Hamon, CSW and Treatment team. Reviewed the information documented and agree with the treatment plan.   Treatment Summary: Daily contact with patient to assess and evaluate symptoms and progress in treatment.  Will continue today 02/23/17 plan as below except where it is noted.   - Continue clozapine 150 mg qhs, 50 mg daily (Confirmed patient registration at [facility]) - EKG reviewed; NSR, QTc 413 msec.  - Discussed with nurse to  monitor bowel movement.  - Continue fluoxetine 40 mg daily for depression - Continue propranolol 20 mg BID for anxiety/fine tremors. -  Continue Trazodone 50 mg q hs for insomnia.  DM consult- pending    Will continue to monitor vitals ,medication compliance and treatment side effects while patient is here.  CSW will start working on disposition.  Patient to participate in therapeutic milieu   Oneta Rack, NP, 02/23/2017, 3:08 PM\  Agree with NP Progress Note

## 2017-02-23 NOTE — [facility] Group Notes (Signed)
[facility] LCSW Group Therapy 02/23/2017 1:15pm  Type of Therapy: Group Therapy- Feelings Around Discharge & Establishing a Supportive Framework  Participation Level:  Did Not Attend  Description of Group:   What is a supportive framework? What does it look like feel like and how do I discern it from and unhealthy non-supportive network? Learn how to cope when supports are not helpful and don't support you. Discuss what to do when your family/friends are not supportive.  Summary of Patient Progress   Therapeutic Modalities:   Cognitive Behavioral Therapy Person-Centered Therapy Motivational Interviewing   Ida Rogue, [place] 02/23/2017 12:10 PM

## 2017-02-23 NOTE — Plan of Care (Signed)
Problem: Medication: Goal: Compliance with prescribed medication regimen will improve Outcome: Progressing Pt is taking meds as prescribed.    

## 2017-02-23 NOTE — Progress Notes (Signed)
  DATA ACTION RESPONSE  Objective- Pt. is visible in the dayroom, seen eating a snack. Presents with an anxious/depressed/tearful    affect and mood. Brightens mildly with interaction. Pt appears to Animal nutritionist.  requesting lots of ginger-ale.  Subjective- Denies having any SI/HI/AVH/Pain at this time. Pt. states "Do you think I am okay?". Is cooperative and remain safe on the unit.  1:1 interaction in private to establish rapport. Encouragement, education, & support given from staff.  BS was 309 at bedtime.   Safety maintained with Q 15 checks. Continue with POC.

## 2017-02-23 NOTE — Progress Notes (Signed)
Recreation Therapy Notes  Date: 02/23/17 Time: 1000 Location: 500 Hall Dayroom  Group Topic: Communication  Goal Area(s) Addresses:  Patient will effectively communicate with peers in group.  Patient will verbalize benefit of healthy communication. Patient will verbalize positive effect of healthy communication on post d/c goals.  Patient will identify communication techniques that made activity effective for group.   Intervention:  Geometrical pictures, pencils, blank paper, something to write on       Activity: Geometrical Shapes.  Patients were spit into groups of two.  One person would be the listener while the other would be the talker.  The talker was given a geometrical picture which they had to describe to the listener.  The listener was to draw the picture as is was being described to them but they could not ask any questions of the talker.  At the end of the round, patients would switch roles.  Education: Communication, Discharge Planning  Education Outcome: Acknowledges understanding/In group clarification offered/Needs additional education.   Clinical Observations/Feedback:  Pt did not attend group.    [name], LRT/CTRS         [name] A 02/23/2017 11:41 AM

## 2017-02-23 NOTE — Progress Notes (Signed)
DAR NOTE: Patient presents with anxious affect and depressed mood. Pt has been isolating this am in his room. Pt brighten up on approached, not sharing much, answer question by nodding his head. Pt has body odor, pt encouraged to take a shower and shave, but not receptive. Pt reports good night sleep, poor appetite, low energy and poor concentration. Denies pain, auditory and visual hallucinations.  Rates depression at 5, hopelessness at 5, and anxiety at 5.  Maintained on routine safety checks.  Medications given as prescribed.  Support and encouragement offered as needed. Will continue to monitor.

## 2017-02-24 LAB — GLUCOSE, CAPILLARY
GLUCOSE-CAPILLARY: 106 mg/dL — AB (ref 65–99)
GLUCOSE-CAPILLARY: 397 mg/dL — AB (ref 65–99)
GLUCOSE-CAPILLARY: 301 mg/dL — AB (ref 65–99)
Glucose-Capillary: 125 mg/dL — ABNORMAL HIGH (ref 65–99)

## 2017-02-24 NOTE — Progress Notes (Signed)
[facility] MD Progress Note  02/24/2017 11:35 AM Masiyah Engen  MRN:  [id number]  Objective: Chart was discussed and reviewed by treatment team. Patient present with some irritability today. " States I just want to rest and leave me alone." States " everything is ok". Patient was interviewed by group home with minimal response to questions. Patient continues to  express apprehension with discharging to a group home/ family house.CSW to continue with disposition -[facility]/assisted living. Support, Encouragement and Reassurances was provided.    Principal Problem: Schizoaffective disorder, depressive type (HCC) Diagnosis:   Patient Active Problem List   Diagnosis Date Noted  . 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 Schizoaffective disorder, depressive type (HCC) [F25.1] 09/23/2016  . Polysubstance abuse (HCC) [F19.10] 07/17/2016  . Tardive dyskinesia [G24.01] 07/16/2016  . Tobacco use disorder [F17.200] 07/07/2016  . Dyslipidemia [E78.5] 07/07/2016  . Asthma [J45.909] 07/07/2016  . HTN (hypertension) [I10] 07/06/2016  . Diabetes (HCC) [E11.9] 12/25/2010   Total Time spent with patient: 20 minutes  Past Psychiatric History: See H&P  Past Medical History:  Past Medical History:  Diagnosis Date  . Anxiety   . Asthma   . Diabetes mellitus   . High blood pressure   . Sinus complaint    History reviewed. No pertinent surgical history.  Family History: History reviewed. No pertinent family history.  Family Psychiatric  History: See H&P  Social History:  History  Alcohol Use No     History  Drug Use No    Social History   Social History  . Marital status: Single    Spouse name: [name]  . Number of children: [name]  . Years of education: [name]   Social History Main Topics  . Smoking status: Current Every Day Smoker    Packs/day: 0.50    Types: Cigarettes  . Smokeless tobacco: Never Used  . Alcohol use No  . Drug use: No  . Sexual activity: Not Currently   Other  Topics Concern  . None   Social History Narrative  . None   Additional Social History:    Pain Medications: See MAR Prescriptions: See MAR Over the Counter: See MAR History of alcohol / drug use?: No history of alcohol / drug abuse  Sleep: Fair  Appetite:  Good  Current Medications: Current Facility-Administered Medications  Medication Dose Route Frequency Provider Last Rate Last Dose  . acetaminophen (TYLENOL) tablet 650 mg  650 mg Oral Q6H PRN Nira Conn A, NP   650 mg at 02/19/17 2129  . alum & mag hydroxide-simeth (MAALOX/MYLANTA) 200-200-20 MG/5ML suspension 30 mL  30 mL Oral Q4H PRN Nira Conn A, NP      . cloZAPine (CLOZARIL) tablet 150 mg  150 mg Oral QHS Nira Conn A, NP   150 mg at 02/23/17 2125  . cloZAPine (CLOZARIL) tablet 50 mg  50 mg Oral Daily Hisada, Barbee Cough, MD   50 mg at 02/24/17 0823  . FLUoxetine (PROZAC) capsule 40 mg  40 mg Oral Daily Nira Conn A, NP   40 mg at 02/24/17 8250  . insulin aspart (novoLOG) injection 0-15 Units  0-15 Units Subcutaneous TID WC Jomarie Longs, MD   15 Units at 02/23/17 1811  . insulin aspart (novoLOG) injection 0-5 Units  0-5 Units Subcutaneous QHS Jomarie Longs, MD   4 Units at 02/23/17 2124  . insulin aspart (novoLOG) injection 5 Units  5 Units Subcutaneous TID WC Jerald Kief, MD   5 Units at 02/23/17 1810  . insulin glargine (  LANTUS) injection 40 Units  40 Units Subcutaneous QHS Nira Conn A, NP   40 Units at 02/23/17 2125  . lisinopril (PRINIVIL,ZESTRIL) tablet 20 mg  20 mg Oral Daily Nira Conn A, NP   20 mg at 02/24/17 0823  . magnesium hydroxide (MILK OF MAGNESIA) suspension 30 mL  30 mL Oral Daily PRN Nira Conn A, NP      . metFORMIN (GLUCOPHAGE) tablet 1,000 mg  1,000 mg Oral BID WC Nira Conn A, NP   1,000 mg at 02/24/17 0823  . montelukast (SINGULAIR) tablet 10 mg  10 mg Oral QHS Nira Conn A, NP   10 mg at 02/23/17 2125  . nicotine (NICODERM CQ - dosed in mg/24 hours) patch 21 mg  21 mg Transdermal  Daily Eappen, Saramma, MD      . OLANZapine zydis (ZYPREXA) disintegrating tablet 5 mg  5 mg Oral TID PRN Jomarie Longs, MD   5 mg at 02/19/17 2133   Or  . OLANZapine (ZYPREXA) injection 5 mg  5 mg Intramuscular TID PRN Eappen, Saramma, MD      . pantoprazole (PROTONIX) EC tablet 40 mg  40 mg Oral Daily Nira Conn A, NP   40 mg at 02/24/17 0823  . propranolol (INDERAL) tablet 20 mg  20 mg Oral BID Nira Conn A, NP   20 mg at 02/24/17 0823  . traZODone (DESYREL) tablet 50 mg  50 mg Oral QHS,MR X 1 Nira Conn A, NP   50 mg at 02/23/17 2231   Lab Results:  Results for orders placed or performed during the hospital encounter of 02/14/17 (from the past 48 hour(s))  Glucose, capillary     Status: Abnormal   Collection Time: 02/22/17 11:56 AM  Result Value Ref Range   Glucose-Capillary 128 (H) 65 - 99 mg/dL   Comment 1 Notify RN    Comment 2 Document in Chart   Glucose, capillary     Status: Abnormal   Collection Time: 02/22/17  4:54 PM  Result Value Ref Range   Glucose-Capillary 239 (H) 65 - 99 mg/dL   Comment 1 Notify RN    Comment 2 Document in Chart   Glucose, capillary     Status: Abnormal   Collection Time: 02/22/17  8:14 PM  Result Value Ref Range   Glucose-Capillary 231 (H) 65 - 99 mg/dL  Glucose, capillary     Status: Abnormal   Collection Time: 02/23/17  6:24 AM  Result Value Ref Range   Glucose-Capillary 130 (H) 65 - 99 mg/dL  Glucose, capillary     Status: None   Collection Time: 02/23/17 11:58 AM  Result Value Ref Range   Glucose-Capillary 94 65 - 99 mg/dL  Glucose, capillary     Status: Abnormal   Collection Time: 02/23/17  5:05 PM  Result Value Ref Range   Glucose-Capillary 360 (H) 65 - 99 mg/dL   Comment 1 Notify RN    Comment 2 Document in Chart   Glucose, capillary     Status: Abnormal   Collection Time: 02/23/17  8:28 PM  Result Value Ref Range   Glucose-Capillary 309 (H) 65 - 99 mg/dL  Glucose, capillary     Status: Abnormal   Collection Time:  02/24/17  6:22 AM  Result Value Ref Range   Glucose-Capillary 106 (H) 65 - 99 mg/dL   Blood Alcohol level:  Lab Results  Component Value Date   ETH <5 01/16/2017   ETH <5 12/21/2016   Metabolic Disorder Labs:  Lab Results  Component Value Date   HGBA1C 7.6 (H) 01/19/2017   MPG 171.42 01/19/2017   MPG 249 11/09/2016   Lab Results  Component Value Date   PROLACTIN 24.5 (H) 09/24/2016   PROLACTIN 3.4 (L) 07/07/2016   Lab Results  Component Value Date   CHOL 122 01/19/2017   TRIG 154 (H) 01/19/2017   HDL 36 (L) 01/19/2017   CHOLHDL 3.4 01/19/2017   VLDL 31 01/19/2017   LDLCALC 55 01/19/2017   LDLCALC 59 12/28/2016   Physical Findings: AIMS: Facial and Oral Movements Muscles of Facial Expression: None, normal Lips and Perioral Area: None, normal Jaw: None, normal Tongue: None, normal,Extremity Movements Upper (arms, wrists, hands, fingers): None, normal Lower (legs, knees, ankles, toes): None, normal, Trunk Movements Neck, shoulders, hips: None, normal, Overall Severity Severity of abnormal movements (highest score from questions above): None, normal Incapacitation due to abnormal movements: None, normal Patient's awareness of abnormal movements (rate only patient's report): No Awareness, Dental Status Current problems with teeth and/or dentures?: No Does patient usually wear dentures?: No  CIWA:  CIWA-Ar Total: 1 COWS:  COWS Total Score: 1  Musculoskeletal: Strength & Muscle Tone: within normal limits Gait & Station: normal Patient leans: [name]  Psychiatric Specialty Exam: Physical Exam  Constitutional: He appears well-developed.  Respiratory: Effort normal.  Neurological: He is alert.  Psychiatric: He has a normal mood and affect. His behavior is normal.    Review of Systems  Unable to perform ROS: Psychiatric disorder  Psychiatric/Behavioral: Positive for depression and hallucinations. Negative for substance abuse and suicidal ideas. The patient is not  nervous/anxious and does not have insomnia.     Blood pressure 106/73, pulse 96, temperature 97.8 F (36.6 C), temperature source Oral, resp. rate 18, height 6' (1.829 m), weight 76.7 kg (169 lb).Body mass index is 22.92 kg/m.  General Appearance: Disheveled and Guarded slight irritability   Eye Contact:  Poor  Speech:  Blocked  Volume:  Decreased  Mood:  Depressed  Affect:  Blunt  Thought Process:  Coherent but does not elaborate  Orientation:  Full (Time, Place, and Person)  Thought Content:  Hallucinations: Auditory  Suicidal Thoughts:  No  Homicidal Thoughts:  No  Memory:  Immediate;   Fair Recent;   Fair  Judgement:  Impaired  Insight:  Lacking  Psychomotor Activity:  Decreased-some improvement with a lot of encouragement. Patient was escorted outside  Concentration:  Concentration: Poor  Recall:  difficult to assess as patient does not elaborate story  Fund of Knowledge:  difficult to assess as patient does not elaborate story  Language:  Fair  Akathisia:  No  Handed:  Right  AIMS (if indicated):     Assets:  Desire for Improvement  ADL's:  Intact  Cognition:  WNL  Sleep:  Number of Hours: 6    I agree with current treatment plan on 02/24/2017, Patient seen face-to-face for psychiatric evaluation follow-up, chart reviewed and case discussed with the MD Ashford Clouse, CSW and Treatment team. Reviewed the information documented and agree with the treatment plan.   Treatment Summary: Daily contact with patient to assess and evaluate symptoms and progress in treatment.  Will continue today 02/24/17 plan as below except where it is noted.   - Continue clozapine 150 mg qhs, 50 mg daily (Confirmed patient registration at [facility]) - EKG reviewed; NSR, QTc 413 msec.  - Discussed with nurse to monitor bowel movement.  - Continue fluoxetine 40 mg daily for depression - Continue propranolol 20 mg  BID for anxiety/fine tremors. - Continue Trazodone 50 mg q hs for insomnia.  DM consult-  pending    Will continue to monitor vitals ,medication compliance and treatment side effects while patient is here.  CSW will start working on disposition.  Patient to participate in therapeutic milieu   Oneta Rack, NP, 02/24/2017, 11:35 AM\  Agree with NP Progress Note

## 2017-02-24 NOTE — [facility] Group Notes (Signed)
LCSW Group Therapy Note  02/24/2017 1:15pm  Type of Therapy/Topic:  Group Therapy:  Balance in Life  Participation Level:  Minimal  Description of Group:    This group will address the concept of balance and how it feels and looks when one is unbalanced. Patients will be encouraged to process areas in their lives that are out of balance and identify reasons for remaining unbalanced. Facilitators will guide patients in utilizing problem-solving interventions to address and correct the stressor making their life unbalanced. Understanding and applying boundaries will be explored and addressed for obtaining and maintaining a balanced life. Patients will be encouraged to explore ways to assertively make their unbalanced needs known to significant others in their lives, using other group members and facilitator for support and feedback.  Therapeutic Goals: 1. Patient will identify two or more emotions or situations they have that consume much of in their lives. 2. Patient will identify signs/triggers that life has become out of balance:  3. Patient will identify two ways to set boundaries in order to achieve balance in their lives:  4. Patient will demonstrate ability to communicate their needs through discussion and/or role plays  Summary of Patient Progress:  Derrick came to group late.  He states that he feels happy when he is with his mom.  When he gets upset he distracts himself by getting a soda or some ice cream.     Therapeutic Modalities:   Cognitive Behavioral Therapy Solution-Focused Therapy Assertiveness Training  [name], Student-Social Work 02/24/2017 1:45 PM

## 2017-02-24 NOTE — Plan of Care (Signed)
Problem: Coping: Goal: Ability to verbalize feelings will improve Outcome: Progressing Pt stated he was feeling ok, pt said he had a good day

## 2017-02-24 NOTE — Progress Notes (Signed)
DAR NOTE: Patient presents with anxious affect and depressed mood. Pt continues to isolate himself in the room, pt only came out for medication after several attempts. Pt has poor hygiene, pt encourage to take a shower but became angry stating, " I took shower yesterday." Pt refused breakfast this morning, his meal coverage with held, Dr aware. Denies pain, auditory and visual hallucinations.  Rates depression at 4, hopelessness at 4, and anxiety at 5.  Maintained on routine safety checks.  Medications given as prescribed.  Support and encouragement offered as needed.  Will continue to monitor.

## 2017-02-24 NOTE — Progress Notes (Signed)
[facility] Group Notes:  (Nursing/MHT/Case Management/Adjunct)  Date:  02/24/2017  Time:  9:49 PM  Type of Therapy:  Psychoeducational Skills  Participation Level:  Active  Participation Quality:  Attentive  Affect:  Appropriate  Cognitive:  Appropriate  Insight:  Improving  Engagement in Group:  Developing/Improving  Modes of Intervention:  Education  Summary of Progress/Problems: Patient states that he had a good day overall since he went outside and because he was able to rest. As for the theme of the day, his personal development will include working on his "breathing" skills.   Hazle Coca S 02/24/2017, 9:49 PM

## 2017-02-24 NOTE — Progress Notes (Signed)
Recreation Therapy Notes  Date: 02/24/17 Time: 1000 Location: 500 Hall Dayroom  Group Topic: Goal Setting  Goal Area(s) Addresses:  Patient will be able to identify at least 3 life goals.  Patient will be able to identify benefit of investing in life goals.  Patient will be able to identify benefit of setting life goals.   Intervention: Life goals worksheet, pencils  Activity: Life Goals.  Patients were given a worksheet with six categories ( family, friends, work/school, spirituality, body and mental health).  For each category, patients were to identify what they were doing well, where they needed to improve and set a goal for how to make that improvement.  Education:  Discharge Planning, Coping Skills, Goal Setting   Education Outcome: Acknowledges Education/In Group Clarification Provided/Needs Additional Education  Clinical Observations: Pt did not attend group.   [name], LRT/CTRS         [name] A 02/24/2017 12:17 PM

## 2017-02-24 NOTE — Progress Notes (Signed)
D: Pt denies SI/HI/AVH. Pt is pleasant and cooperative. Pt said he had a good day, pt brightens his affect when Clinical research associate approaches.   A: Pt was offered support and encouragement. Pt was given scheduled medications. Pt was encourage to attend groups. Q 15 minute checks were done for safety.   R:Pt attends groups and interacts well with peers and staff. Pt is taking medication. Pt has no complaints.Pt receptive to treatment and safety maintained on unit.

## 2017-02-25 LAB — GLUCOSE, CAPILLARY
GLUCOSE-CAPILLARY: 83 mg/dL (ref 65–99)
GLUCOSE-CAPILLARY: 97 mg/dL (ref 65–99)
Glucose-Capillary: 208 mg/dL — ABNORMAL HIGH (ref 65–99)

## 2017-02-25 NOTE — Progress Notes (Signed)
Recreation Therapy Notes  Date: 02/25/17 Time: 1000 Location: 500 Hall Dayroom  Group Topic: Communication, Team Building, Problem Solving  Goal Area(s) Addresses:  Patient will effectively work with peer towards shared goal.  Patient will identify skill used to make activity successful.  Patient will identify how skills used during activity can be used to reach post d/c goals.   Intervention: Rubber discs  Activity: Each patient was given a rubber disc with one extra disc on the nurse's station.  Patients were to work together to get to the end of the hall and back to their starting point.    Education: Pharmacist, community, Building control surveyor.   Education Outcome: Acknowledges education/In group clarification offered/Needs additional education.   Clinical Observations/Feedback:  Pt did not attend group.   [name], LRT/CTRS         [name], Donaven Criswell A 02/25/2017 11:08 AM

## 2017-02-25 NOTE — [facility] Group Notes (Signed)
LCSW Group Therapy 02/25/2017 1:15pm  Type of Therapy and Topic:  Group Therapy:  Change and Accountability  Participation Level:  None  Description of Group In this group, patients discussed power and accountability for change.  The group identified the challenges related to accountability and the difficulty of accepting the outcomes of negative behaviors.  Patients were encouraged to openly discuss a challenge/change they could take responsibility for.  Patients discussed the use of "change talk" and positive thinking as ways to support achievement of personal goals.  The group discussed ways to give support and empowerment to peers.  Therapeutic Goals: 1. Patients will state the relationship between personal power and accountability in the change process 2. Patients will identify the positive and negative consequences of a personal choice they have made 3. Patients will identify one challenge/choice they will take responsibility for making 4. Patients will discuss the role of "change talk" and the impact of positive thinking as it supports successful personal change 5. Patients will verbalize support and affirmation of change efforts in peers  Summary of Patient Progress:  Tony Cunningham attended group.  He was called out of the room to meet with a lady from a group home in [facility].  He did not return.    Therapeutic Modalities Solution Focused Brief Therapy Motivational Interviewing Cognitive Behavioral Therapy  Carlynn Herald Work 02/25/2017 1:14 PM

## 2017-02-25 NOTE — Progress Notes (Signed)
Inpatient Diabetes Program Recommendations  AACE/ADA: New Consensus Statement on Inpatient Glycemic Control (2015)  Target Ranges:  Prepandial:   less than 140 mg/dL      Peak postprandial:   less than 180 mg/dL (1-2 hours)      Critically ill patients:  140 - 180 mg/dL   Results for DANE, KOPKE (MRN [id number]) as of 02/25/2017 10:01  Ref. Range 02/23/2017 06:24 02/23/2017 11:58 02/23/2017 17:05 02/23/2017 20:28 02/24/2017 06:22  Glucose-Capillary Latest Ref Range: 65 - 99 mg/dL 637 (H) 94 858 (H) 850 (H) 106 (H)   Results for LOGIN, MUCKLEROY (MRN [id number]) as of 02/25/2017 10:01  Ref. Range 02/24/2017 06:22 02/24/2017 12:03 02/24/2017 16:38 02/24/2017 20:34 02/25/2017 05:48  Glucose-Capillary Latest Ref Range: 65 - 99 mg/dL 676 (H) 720 (H) 947 (H) 301 (H) 83   Review of Glycemic Control  Diabetes history: DM Outpatient Diabetes medications: Lantus 45 units QHS, Metformin 1000 mg BID, Novolog 6 units tid with meals Current orders for Inpatient glycemic control: Lantus 40 units QHS, Novolog Moderate 0-15 units tid + Novolog 5 units tid meal coverage + Novolog 0-5 units QHS bedtime correction  Inpatient Diabetes Program Recommendations:    Glucose increases significantly after meals.. Patient is already on Novolog 5 units tid meal coverage.  Consider increasing meal coverage further to 8 units tid only if patient eats at least 50% of meals.  Thanks,  Christena Deem RN, MSN, [facility] Inpatient Diabetes Coordinator Team Pager [phone] (8a-5p)

## 2017-02-25 NOTE — [facility] Counselor (Signed)
Called Tony Cunningham, [facility] Manager, with patient for treatment plan meeting.  Mother and sister were supposed to call in as well, but did not.  Dois Davenport informed Osie that he was no longer eligible for Peer Support through [facility] as it had not successfully prevented him from rehospitalization, and that [facility] is recommending placement in [facility], ACT team and DBT therapy.  She also confronted him on the fact that he appears to be more invested in being sick than being well.  He vehemently denied this.   Pt was visited by Ms Allyne Gee from [place] who has a GH there.  [phone].  She offered him a bed, and he stated he is commited to going there, "but only when I am well enough to leave the hospital."

## 2017-02-25 NOTE — Progress Notes (Signed)
[facility] MD Progress Note  02/25/2017 12:22 PM Tony Cunningham  MRN:  [id number]   Subjective: "I'm doing fine and trying to do my best to participation in the unit"  Objective: Patient seen by this MD, chart reviewed case discussed with treatment team. Patient appeared in milieu, calm and cooperative and has some irritability from time to time. Patient has withdrawn, constricted affect, soft voice and limited in brief verbal response. Reportedly patient was interviewed by group home with minimal response to questions. Patient continues to  express apprehension with discharging to a group home/ family house.CSW to continue with disposition -[facility]/assisted living. Support, Encouragement and Reassurances was provided.    Principal Problem: Schizoaffective disorder, depressive type (HCC) Diagnosis:   Patient Active Problem List   Diagnosis Date Noted  . 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 Schizoaffective disorder, depressive type (HCC) [F25.1] 09/23/2016  . Polysubstance abuse (HCC) [F19.10] 07/17/2016  . Tardive dyskinesia [G24.01] 07/16/2016  . Tobacco use disorder [F17.200] 07/07/2016  . Dyslipidemia [E78.5] 07/07/2016  . Asthma [J45.909] 07/07/2016  . HTN (hypertension) [I10] 07/06/2016  . Diabetes (HCC) [E11.9] 12/25/2010   Total Time spent with patient: 20 minutes  Past Psychiatric History: Patient with long standing hx of mental illness as well as multiple hospitalziations . Recently was at [facility] , prior to that at [facility]. Pt was receiving ECT treatments at [facility] , was supposed to return for maintenance treatment on the 28 th of this month. Patient has a history of cutting behaviors and suicidal attempts.  Past Medical History:  Past Medical History:  Diagnosis Date  . Anxiety   . Asthma   . Diabetes mellitus   . High blood pressure   . Sinus complaint    History reviewed. No pertinent surgical history.  Family History: History reviewed. No pertinent family  history.  Family Psychiatric  History: Unknown  Social History:  History  Alcohol Use No     History  Drug Use No    Social History   Social History  . Marital status: Single    Spouse name: [name]  . Number of children: [name]  . Years of education: [name]   Social History Main Topics  . Smoking status: Current Every Day Smoker    Packs/day: 0.50    Types: Cigarettes  . Smokeless tobacco: Never Used  . Alcohol use No  . Drug use: No  . Sexual activity: Not Currently   Other Topics Concern  . None   Social History Narrative  . None   Additional Social History:    Pain Medications: See MAR Prescriptions: See MAR Over the Counter: See MAR History of alcohol / drug use?: No history of alcohol / drug abuse  Sleep: Fair  Appetite:  Good  Current Medications: Current Facility-Administered Medications  Medication Dose Route Frequency Provider Last Rate Last Dose  . acetaminophen (TYLENOL) tablet 650 mg  650 mg Oral Q6H PRN Nira Conn A, NP   650 mg at 02/19/17 2129  . alum & mag hydroxide-simeth (MAALOX/MYLANTA) 200-200-20 MG/5ML suspension 30 mL  30 mL Oral Q4H PRN Nira Conn A, NP      . cloZAPine (CLOZARIL) tablet 150 mg  150 mg Oral QHS Nira Conn A, NP   150 mg at 02/24/17 2128  . cloZAPine (CLOZARIL) tablet 50 mg  50 mg Oral Daily Neysa Hotter, MD   50 mg at 02/25/17 0808  . FLUoxetine (PROZAC) capsule 40 mg  40 mg Oral Daily Jackelyn Poling, NP  40 mg at 02/25/17 0808  . insulin aspart (novoLOG) injection 0-15 Units  0-15 Units Subcutaneous TID WC Jomarie Longs, MD   15 Units at 02/24/17 1645  . insulin aspart (novoLOG) injection 0-5 Units  0-5 Units Subcutaneous QHS Jomarie Longs, MD   4 Units at 02/24/17 2134  . insulin aspart (novoLOG) injection 5 Units  5 Units Subcutaneous TID WC Jerald Kief, MD   Stopped at 02/25/17 [phone]  . insulin glargine (LANTUS) injection 40 Units  40 Units Subcutaneous QHS Nira Conn A, NP   40 Units at 02/24/17 2134  .  lisinopril (PRINIVIL,ZESTRIL) tablet 20 mg  20 mg Oral Daily Nira Conn A, NP   20 mg at 02/25/17 0809  . magnesium hydroxide (MILK OF MAGNESIA) suspension 30 mL  30 mL Oral Daily PRN Nira Conn A, NP      . metFORMIN (GLUCOPHAGE) tablet 1,000 mg  1,000 mg Oral BID WC Nira Conn A, NP   1,000 mg at 02/25/17 0807  . montelukast (SINGULAIR) tablet 10 mg  10 mg Oral QHS Nira Conn A, NP   10 mg at 02/24/17 2128  . nicotine (NICODERM CQ - dosed in mg/24 hours) patch 21 mg  21 mg Transdermal Daily Eappen, Saramma, MD      . OLANZapine zydis (ZYPREXA) disintegrating tablet 5 mg  5 mg Oral TID PRN Jomarie Longs, MD   5 mg at 02/19/17 2133   Or  . OLANZapine (ZYPREXA) injection 5 mg  5 mg Intramuscular TID PRN Eappen, Saramma, MD      . pantoprazole (PROTONIX) EC tablet 40 mg  40 mg Oral Daily Nira Conn A, NP   40 mg at 02/25/17 0809  . propranolol (INDERAL) tablet 20 mg  20 mg Oral BID Nira Conn A, NP   20 mg at 02/25/17 0809  . traZODone (DESYREL) tablet 50 mg  50 mg Oral QHS,MR X 1 Nira Conn A, NP   50 mg at 02/24/17 2204   Lab Results:  Results for orders placed or performed during the hospital encounter of 02/14/17 (from the past 48 hour(s))  Glucose, capillary     Status: Abnormal   Collection Time: 02/23/17  5:05 PM  Result Value Ref Range   Glucose-Capillary 360 (H) 65 - 99 mg/dL   Comment 1 Notify RN    Comment 2 Document in Chart   Glucose, capillary     Status: Abnormal   Collection Time: 02/23/17  8:28 PM  Result Value Ref Range   Glucose-Capillary 309 (H) 65 - 99 mg/dL  Glucose, capillary     Status: Abnormal   Collection Time: 02/24/17  6:22 AM  Result Value Ref Range   Glucose-Capillary 106 (H) 65 - 99 mg/dL  Glucose, capillary     Status: Abnormal   Collection Time: 02/24/17 12:03 PM  Result Value Ref Range   Glucose-Capillary 125 (H) 65 - 99 mg/dL  Glucose, capillary     Status: Abnormal   Collection Time: 02/24/17  4:38 PM  Result Value Ref Range    Glucose-Capillary 397 (H) 65 - 99 mg/dL  Glucose, capillary     Status: Abnormal   Collection Time: 02/24/17  8:34 PM  Result Value Ref Range   Glucose-Capillary 301 (H) 65 - 99 mg/dL  Glucose, capillary     Status: None   Collection Time: 02/25/17  5:48 AM  Result Value Ref Range   Glucose-Capillary 83 65 - 99 mg/dL  Glucose, capillary  Status: None   Collection Time: 02/25/17 12:01 PM  Result Value Ref Range   Glucose-Capillary 97 65 - 99 mg/dL   Blood Alcohol level:  Lab Results  Component Value Date   ETH <5 01/16/2017   ETH <5 12/21/2016   Metabolic Disorder Labs: Lab Results  Component Value Date   HGBA1C 7.6 (H) 01/19/2017   MPG 171.42 01/19/2017   MPG 249 11/09/2016   Lab Results  Component Value Date   PROLACTIN 24.5 (H) 09/24/2016   PROLACTIN 3.4 (L) 07/07/2016   Lab Results  Component Value Date   CHOL 122 01/19/2017   TRIG 154 (H) 01/19/2017   HDL 36 (L) 01/19/2017   CHOLHDL 3.4 01/19/2017   VLDL 31 01/19/2017   LDLCALC 55 01/19/2017   LDLCALC 59 12/28/2016   Physical Findings: AIMS: Facial and Oral Movements Muscles of Facial Expression: None, normal Lips and Perioral Area: None, normal Jaw: None, normal Tongue: None, normal,Extremity Movements Upper (arms, wrists, hands, fingers): None, normal Lower (legs, knees, ankles, toes): None, normal, Trunk Movements Neck, shoulders, hips: None, normal, Overall Severity Severity of abnormal movements (highest score from questions above): None, normal Incapacitation due to abnormal movements: None, normal Patient's awareness of abnormal movements (rate only patient's report): No Awareness, Dental Status Current problems with teeth and/or dentures?: No Does patient usually wear dentures?: No  CIWA:  CIWA-Ar Total: 1 COWS:  COWS Total Score: 1  Musculoskeletal: Strength & Muscle Tone: within normal limits Gait & Station: normal Patient leans: [name]  Psychiatric Specialty Exam: Physical Exam   Constitutional: He appears well-developed.  Respiratory: Effort normal.  Neurological: He is alert.  Psychiatric: He has a normal mood and affect. His behavior is normal.    Review of Systems  Unable to perform ROS: Psychiatric disorder  Psychiatric/Behavioral: Positive for depression and hallucinations. Negative for substance abuse and suicidal ideas. The patient is not nervous/anxious and does not have insomnia.     Blood pressure 105/62, pulse (!) 106, temperature 98.2 F (36.8 C), temperature source Oral, resp. rate 16, height 6' (1.829 m), weight 76.7 kg (169 lb).Body mass index is 22.92 kg/m.  General Appearance: Disheveled and Guarded    Eye Contact:  Fair  Speech:  Blocked, responds with brief and single word responses  Volume:  Decreased, soft voice  Mood:  Depressed  Affect:  Blunt to constricted  Thought Process:  Coherent limited TP, but does not elaborate  Orientation:  Full (Time, Place, and Person)  Thought Content:  Hallucinations: Auditory and vague visual, could not describe.   Suicidal Thoughts:  No, says some times  Homicidal Thoughts:  No  Memory:  Immediate;   Fair Recent;   Fair  Judgement:  Impaired  Insight:  Lacking  Psychomotor Activity:  Decreased-some improvement with a lot of encouragement.Able to appear in milieu and group participation  Concentration:  Concentration: Poor  Recall:  difficult to assess as patient does not elaborate story  Fund of Knowledge:  difficult to assess as patient does not elaborate story  Language:  Fair  Akathisia:  No  Handed:  Right  AIMS (if indicated):     Assets:  Desire for Improvement  ADL's:  Intact  Cognition:  WNL  Sleep:  Number of Hours: 6.25   Tony Cunningham s a 41 years old male with history of schizoaffective disorder and polysubstance abuse, insulin-dependent diabetes with the suicidal/homicidal ideation and psychosis. Patient was received multiple acute psychiatric hospitalization treatment and also ECT  treatment at [facility] in  the past and failed to participate in maintenance ECT treatment. Patient seen face-to-face for psychiatric evaluation follow-up, chart reviewed and case discussed with the treatment team including CSW. Reviewed the information documented and agree with the treatment plan.  I agree with current treatment plan on 02/25/2017,   Treatment Summary: Daily contact with patient to assess and evaluate symptoms and progress in treatment.  Will continue today 02/25/17 plan as below except where it is noted.  - Continue clozapine 150 mg qhs, 50 mg daily (Confirmed patient registration at [facility]) - EKG reviewed; NSR, QTc 413 msec with is within normal range.  - Discussed with nurse to monitor bowel movement.  - Continue Fluoxetine 40 mg daily for depression - Continue propranolol 20 mg BID for anxiety/fine tremors. - Continue Trazodone 50 mg q hs for insomnia.  DM consult- pending  Will continue to monitor vitals ,medication compliance and treatment side effects  CSW will start working on disposition.  Patient to participate in therapeutic milieu    Leata Mouse, MD, 02/25/2017, 12:22 PM\

## 2017-02-25 NOTE — Progress Notes (Signed)
DAR NOTE: Patient presents with anxious affect and depressed mood.  Denies pain, auditory and visual hallucinations. Report poor sleep, poor appetite, low energy, and poor concentration. Pt ate 100% of his lunch, 5 units novolog given. Rates depression at 8, hopelessness at 8, and anxiety at 6.  Maintained on routine safety checks.  Medications given as prescribed.  Support and encouragement offered as needed.  Will continue to monitor.

## 2017-02-25 NOTE — Progress Notes (Signed)
Adult Psychoeducational Group Note  Date:  02/25/2017 Time:  9:55 PM  Group Topic/Focus:  Wrap-Up Group:   The focus of this group is to help patients review their daily goal of treatment and discuss progress on daily workbooks.  Participation Level:  Active  Participation Quality:  Appropriate  Affect:  Appropriate  Cognitive:  Appropriate  Insight: Appropriate  Engagement in Group:  Engaged  Modes of Intervention:  Discussion  Additional Comments:  Pt stated his goal for today was to make it through the day with no set backs. Pt rated his day a 5 out of 10. Pt stated his goal for tomorrow was to attend all groups held.  [name] 02/25/2017, 9:55 PM

## 2017-02-26 LAB — GLUCOSE, CAPILLARY
GLUCOSE-CAPILLARY: 272 mg/dL — AB (ref 65–99)
GLUCOSE-CAPILLARY: 106 mg/dL — AB (ref 65–99)
GLUCOSE-CAPILLARY: 73 mg/dL (ref 65–99)
GLUCOSE-CAPILLARY: 224 mg/dL — AB (ref 65–99)
GLUCOSE-CAPILLARY: 406 mg/dL — AB (ref 65–99)
Glucose-Capillary: 377 mg/dL — ABNORMAL HIGH (ref 65–99)

## 2017-02-26 MED ORDER — TUBERCULIN PPD 5 UNIT/0.1ML ID SOLN: 5.0000 [IU] | Freq: Once | INTRADERMAL | Status: DC

## 2017-02-26 NOTE — Progress Notes (Signed)
Critical Lab Value CBG = 406 at 2032  NP Nira Conn notified. No new orders at this time due to patient's history of brittle CBG readings. Will medicate with current sliding scale regimen and scheduled Lantus and recheck CBG within an hour. Patient asymptomatic at this time. PO fluids pushed. Will continue to monitor.

## 2017-02-26 NOTE — Progress Notes (Signed)
Nursing Progress Note [id number]  D) Patient presents with a flat affect. Patient is up in the milieu and calm/cooperative. Patient CBG elevated this evening, patient medicated as prescribed. Carb modified diet reviewed with patient; patient needs reinforcement. Patient denies SI/HI/AVH or pain. Patient contracts for safety on the unit. Patient compliant with medications.  A) Emotional support given. 1:1 interaction and active listening provided. Patient medicated as prescribed. Medications and plan of care reviewed with patient. Patient verbalized understanding without further questions. Snacks and fluids provided. Opportunities for questions or concerns presented to patient. Patient encouraged to continue to work on treatment goals. Labs, vital signs and patient behavior monitored throughout shift. Patient safety maintained with q15 min safety checks. Low fall risk precautions in place and reviewed with patient; patient verbalized understanding.  R) Patient receptive to interaction with nurse. Patient remains safe on the unit at this time. Patient denies any adverse medication reactions at this time. Patient is resting in bed without complaints. Will continue to monitor.

## 2017-02-26 NOTE — Progress Notes (Signed)
D: Pt denies SI/HI/AVH. Pt is pleasant and cooperative. Pt stated he will be going to a place in [place] on D/C  A: Pt was offered support and encouragement. Pt was given scheduled medications. Pt was encourage to attend groups. Q 15 minute checks were done for safety.   R:Pt attends groups and interacts well with peers and staff. Pt is taking medication. Pt has no complaints at this time. Pt receptive to treatment and safety maintained on unit.

## 2017-02-26 NOTE — Progress Notes (Signed)
Patient ID: Tony Cunningham, male   DOB: 1975-12-01, 41 y.o.   MRN: [id number] PER STATE REGULATIONS 482.30  THIS CHART WAS REVIEWED FOR MEDICAL NECESSITY WITH RESPECT TO THE PATIENT'S ADMISSION/ DURATION OF STAY.  NEXT REVIEW DATE: 03/02/2017 Willa Rough, RN, BSN CASE MANAGER

## 2017-02-26 NOTE — Plan of Care (Signed)
Problem: Safety: Goal: Periods of time without injury will increase Outcome: Progressing Patient is on q15 minute safety checks and low fall risk precautions. Patient contracts for safety on the unit and remains safe at this time.   

## 2017-02-26 NOTE — Progress Notes (Signed)
Recreation Therapy Notes  Date: 02/26/17 Time: 1000 Location: 500 Hall Dayroom  Group Topic: Leisure Education  Goal Area(s) Addresses:  Patient will identify positive leisure activities.  Patient will identify one positive benefit of participation in leisure activities.   Intervention: [employer], dry erase marker, eraser, container with words  Activity: Leisure Pictionary.  Patients were divided into two groups.  One person from the group would pick a slip of paper from the container.  Patient would draw the picture of whatever word they picked on the board.  That team would have one minute to draw and guess what the picture is.  After the minute is up, they would get one final guess.  If the team doesn't guess the picture, the other team gets a chance to steal the point.  Education:  Leisure Education, Building control surveyor  Education Outcome: Acknowledges education/In group clarification offered/Needs additional education  Clinical Observations/Feedback: Pt did not attend group.   [name], LRT/CTRS         [name] A 02/26/2017 12:18 PM

## 2017-02-26 NOTE — Progress Notes (Signed)
Patient CBG trending down. NP Nira Conn notified. No new orders at this time due to CBG brittle nature/history of readings and patient history of refusing breakfast. Patient remains asymptomatic. Will continue to monitor.

## 2017-02-26 NOTE — Progress Notes (Signed)
DAR NOTE: Pt present with flat affect and depressed mood in the unit. Pt has been isolating himself and has been bed most of the time. Pt denies physical pain, took all his meds as scheduled. As per self inventory, pt did not fill in one. Pt rate depression at 4, hopeless ness at 04, and anxiety at 5. Pt's safety ensured with 15 minute and environmental checks. Pt currently denies SI/HI and A/V hallucinations. Pt verbally agrees to seek staff if SI/HI or A/VH occurs and to consult with staff before acting on these thoughts. Will continue POC.

## 2017-02-26 NOTE — Progress Notes (Signed)
Adult Psychoeducational Group Note  Date:  02/26/2017 Time:  10:20 PM  Group Topic/Focus:  Wrap-Up Group:   The focus of this group is to help patients review their daily goal of treatment and discuss progress on daily workbooks.  Participation Level:  Active  Participation Quality:  Appropriate  Affect:  Appropriate  Cognitive:  Appropriate  Insight: Appropriate  Engagement in Group:  Engaged  Modes of Intervention:  Discussion  Additional Comments:  Pt stated his goal for today was to get some rest. Pt stated he accomplished his goal for today. Pt rated his over all day an 8 of 10. Pt stated his goal for tomorrow was to attended all groups held.  Tony Cunningham 02/26/2017, 10:20 PM

## 2017-02-26 NOTE — Plan of Care (Signed)
Problem: Coping: Goal: Ability to verbalize frustrations and anger appropriately will improve Outcome: Progressing Pt stated he was glad to be going to the group home

## 2017-02-26 NOTE — Progress Notes (Signed)
[facility] MD Progress Note  02/26/2017 11:37 AM Tony Cunningham  MRN:  [id number]   Subjective:  Tony Cunningham reports " I am so, so today."  Objective: Tony Cunningham seen resting in bed. Patient present with a flat and guarded affect. Reports he just wanted to rest today. Patient continues to isolate with limiting interaction and brief engagement during this assessment. Patient reports the voices are "okay". thought blocking noted. Slight irritation noted with response. CWS continue seeking placing placement. PPD was order,placed and read on 9/26. Support, encouragement and reassurance was provided.   Principal Problem: Schizoaffective disorder, depressive type (HCC) Diagnosis:   Patient Active Problem List   Diagnosis Date Noted  . 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 Schizoaffective disorder, depressive type (HCC) [F25.1] 09/23/2016  . Polysubstance abuse (HCC) [F19.10] 07/17/2016  . Tardive dyskinesia [G24.01] 07/16/2016  . Tobacco use disorder [F17.200] 07/07/2016  . Dyslipidemia [E78.5] 07/07/2016  . Asthma [J45.909] 07/07/2016  . HTN (hypertension) [I10] 07/06/2016  . Diabetes (HCC) [E11.9] 12/25/2010   Total Time spent with patient: 20 minutes  Past Psychiatric History: Patient with long standing hx of mental illness as well as multiple hospitalziations . Recently was at [facility] , prior to that at [facility]. Pt was receiving ECT treatments at [facility] , was supposed to return for maintenance treatment on the 28 th of this month. Patient has a history of cutting behaviors and suicidal attempts.  Past Medical History:  Past Medical History:  Diagnosis Date  . Anxiety   . Asthma   . Diabetes mellitus   . High blood pressure   . Sinus complaint    History reviewed. No pertinent surgical history.  Family History: History reviewed. No pertinent family history.  Family Psychiatric  History: Unknown  Social History:  History  Alcohol Use No     History  Drug Use No    Social  History   Social History  . Marital status: Single    Spouse name: [name]  . Number of children: [name]  . Years of education: [name]   Social History Main Topics  . Smoking status: Current Every Day Smoker    Packs/day: 0.50    Types: Cigarettes  . Smokeless tobacco: Never Used  . Alcohol use No  . Drug use: No  . Sexual activity: Not Currently   Other Topics Concern  . None   Social History Narrative  . None   Additional Social History:    Pain Medications: See MAR Prescriptions: See MAR Over the Counter: See MAR History of alcohol / drug use?: No history of alcohol / drug abuse  Sleep: Fair  Appetite:  Good  Current Medications: Current Facility-Administered Medications  Medication Dose Route Frequency Provider Last Rate Last Dose  . acetaminophen (TYLENOL) tablet 650 mg  650 mg Oral Q6H PRN Nira Conn A, NP   650 mg at 02/19/17 2129  . alum & mag hydroxide-simeth (MAALOX/MYLANTA) 200-200-20 MG/5ML suspension 30 mL  30 mL Oral Q4H PRN Nira Conn A, NP      . cloZAPine (CLOZARIL) tablet 150 mg  150 mg Oral QHS Nira Conn A, NP   150 mg at 02/25/17 2142  . cloZAPine (CLOZARIL) tablet 50 mg  50 mg Oral Daily Hisada, Barbee Cough, MD   50 mg at 02/26/17 0933  . FLUoxetine (PROZAC) capsule 40 mg  40 mg Oral Daily Nira Conn A, NP   40 mg at 02/26/17 0934  . insulin aspart (novoLOG) injection 0-15 Units  0-15 Units Subcutaneous TID  WC Jomarie Longs, MD   5 Units at 02/25/17 1811  . insulin aspart (novoLOG) injection 0-5 Units  0-5 Units Subcutaneous QHS Jomarie Longs, MD   3 Units at 02/25/17 2147  . insulin aspart (novoLOG) injection 5 Units  5 Units Subcutaneous TID WC Jerald Kief, MD   5 Units at 02/25/17 1809  . insulin glargine (LANTUS) injection 40 Units  40 Units Subcutaneous QHS Nira Conn A, NP   40 Units at 02/25/17 2147  . lisinopril (PRINIVIL,ZESTRIL) tablet 20 mg  20 mg Oral Daily Nira Conn A, NP   20 mg at 02/26/17 0934  . magnesium hydroxide (MILK OF  MAGNESIA) suspension 30 mL  30 mL Oral Daily PRN Nira Conn A, NP      . metFORMIN (GLUCOPHAGE) tablet 1,000 mg  1,000 mg Oral BID WC Nira Conn A, NP   1,000 mg at 02/26/17 0933  . montelukast (SINGULAIR) tablet 10 mg  10 mg Oral QHS Nira Conn A, NP   10 mg at 02/25/17 2142  . nicotine (NICODERM CQ - dosed in mg/24 hours) patch 21 mg  21 mg Transdermal Daily Eappen, Saramma, MD      . OLANZapine zydis (ZYPREXA) disintegrating tablet 5 mg  5 mg Oral TID PRN Jomarie Longs, MD   5 mg at 02/19/17 2133   Or  . OLANZapine (ZYPREXA) injection 5 mg  5 mg Intramuscular TID PRN Eappen, Saramma, MD      . pantoprazole (PROTONIX) EC tablet 40 mg  40 mg Oral Daily Nira Conn A, NP   40 mg at 02/26/17 0934  . propranolol (INDERAL) tablet 20 mg  20 mg Oral BID Nira Conn A, NP   20 mg at 02/26/17 0933  . traZODone (DESYREL) tablet 50 mg  50 mg Oral QHS,MR X 1 Nira Conn A, NP   50 mg at 02/25/17 2205   Lab Results:  Results for orders placed or performed during the hospital encounter of 02/14/17 (from the past 48 hour(s))  Glucose, capillary     Status: Abnormal   Collection Time: 02/24/17 12:03 PM  Result Value Ref Range   Glucose-Capillary 125 (H) 65 - 99 mg/dL  Glucose, capillary     Status: Abnormal   Collection Time: 02/24/17  4:38 PM  Result Value Ref Range   Glucose-Capillary 397 (H) 65 - 99 mg/dL  Glucose, capillary     Status: Abnormal   Collection Time: 02/24/17  8:34 PM  Result Value Ref Range   Glucose-Capillary 301 (H) 65 - 99 mg/dL  Glucose, capillary     Status: None   Collection Time: 02/25/17  5:48 AM  Result Value Ref Range   Glucose-Capillary 83 65 - 99 mg/dL  Glucose, capillary     Status: None   Collection Time: 02/25/17 12:01 PM  Result Value Ref Range   Glucose-Capillary 97 65 - 99 mg/dL  Glucose, capillary     Status: Abnormal   Collection Time: 02/25/17  4:56 PM  Result Value Ref Range   Glucose-Capillary 208 (H) 65 - 99 mg/dL   Comment 1 Notify RN     Comment 2 Document in Chart   Glucose, capillary     Status: Abnormal   Collection Time: 02/25/17  7:50 PM  Result Value Ref Range   Glucose-Capillary 272 (H) 65 - 99 mg/dL   Comment 1 Notify RN   Glucose, capillary     Status: Abnormal   Collection Time: 02/26/17  6:15 AM  Result  Value Ref Range   Glucose-Capillary 106 (H) 65 - 99 mg/dL   Blood Alcohol level:  Lab Results  Component Value Date   ETH <5 01/16/2017   ETH <5 12/21/2016   Metabolic Disorder Labs: Lab Results  Component Value Date   HGBA1C 7.6 (H) 01/19/2017   MPG 171.42 01/19/2017   MPG 249 11/09/2016   Lab Results  Component Value Date   PROLACTIN 24.5 (H) 09/24/2016   PROLACTIN 3.4 (L) 07/07/2016   Lab Results  Component Value Date   CHOL 122 01/19/2017   TRIG 154 (H) 01/19/2017   HDL 36 (L) 01/19/2017   CHOLHDL 3.4 01/19/2017   VLDL 31 01/19/2017   LDLCALC 55 01/19/2017   LDLCALC 59 12/28/2016   Physical Findings: AIMS: Facial and Oral Movements Muscles of Facial Expression: None, normal Lips and Perioral Area: None, normal Jaw: None, normal Tongue: None, normal,Extremity Movements Upper (arms, wrists, hands, fingers): None, normal Lower (legs, knees, ankles, toes): None, normal, Trunk Movements Neck, shoulders, hips: None, normal, Overall Severity Severity of abnormal movements (highest score from questions above): None, normal Incapacitation due to abnormal movements: None, normal Patient's awareness of abnormal movements (rate only patient's report): No Awareness, Dental Status Current problems with teeth and/or dentures?: No Does patient usually wear dentures?: No  CIWA:  CIWA-Ar Total: 1 COWS:  COWS Total Score: 1  Musculoskeletal: Strength & Muscle Tone: within normal limits Gait & Station: normal Patient leans: [name]  Psychiatric Specialty Exam: Physical Exam  Nursing note and vitals reviewed. Respiratory: Effort normal.  Neurological: He is alert.  Psychiatric: He has a normal  mood and affect. His behavior is normal.    Review of Systems  Unable to perform ROS: Psychiatric disorder  Psychiatric/Behavioral: Positive for depression and hallucinations. Negative for substance abuse and suicidal ideas. The patient is not nervous/anxious and does not have insomnia.     Blood pressure 109/83, pulse 100, temperature 98.6 F (37 C), temperature source Oral, resp. rate 18, height 6' (1.829 m), weight 76.7 kg (169 lb).Body mass index is 22.92 kg/m.  General Appearance: Disheveled and Guarded malodors     Eye Contact:  Fair  Speech:  Blocked and Slow, responds with brief and single word responses  Volume:  Decreased, soft voice  Mood:  Depressed  Affect:  Blunt to constricted  Thought Process:  Coherent   Orientation:  Full (Time, Place, and Person)  Thought Content:  Hallucinations: Auditory minimizing resposes  Suicidal Thoughts:  No, says some times  Homicidal Thoughts:  No  Memory:  Immediate;   Fair Recent;   Fair  Judgement:  Impaired  Insight:  Lacking  Psychomotor Activity:  Decreased-some improvement with a lot of encouragement.Able to appear in milieu and group participation  Concentration:  Concentration: Poor  Recall:  difficult to assess as patient does not elaborate story  Fund of Knowledge:  difficult to assess as patient does not elaborate story  Language:  Fair  Akathisia:  No  Handed:  Right  AIMS (if indicated):     Assets:  Desire for Improvement  ADL's:  Intact  Cognition:  WNL  Sleep:  Number of Hours: 6     I agree with current treatment plan on 02/26/2017, Patient seen face-to-face for psychiatric evaluation follow-up, chart reviewed. Reviewed the information documented and agree with the treatment plan.  Treatment Summary: Daily contact with patient to assess and evaluate symptoms and progress in treatment.  Will continue today 02/26/17 plan as below except where it is  noted.  - Continue clozapine 150 mg qhs, 50 mg daily (Confirmed  patient registration at [facility]) - EKG reviewed; NSR, QTc 413 msec with is within normal range.  - Discussed with nurse to monitor bowel movement.  - Continue Fluoxetine 40 mg daily for depression - Continue propranolol 20 mg BID for anxiety/fine tremors. - Continue Trazodone 50 mg q hs for insomnia.  DM consult- pending  Will continue to monitor vitals ,medication compliance and treatment side effects  CSW will start working on disposition.  Patient to participate in therapeutic milieu    Oneta Rack, NP, 02/26/2017, 11:37 AM\  Agree with NP Progress Note

## 2017-02-26 NOTE — [facility] Group Notes (Signed)
.  [facility] LCSW Group Therapy  02/26/2017 2:33 PM  Type of Therapy:  Group Therapy  Participation Level: invited, did not attend  Modes of Intervention:  Discussion, Exploration, Socialization and Support  Summary of Progress/Problems:  Chaplain led group explored concept of hope and its relevance to mental health recovery.  Patients explored themes including what matters to them personally, how others responses are similar/different, and what they are hopeful for.  Group members discussed relevance of social supports, innter strength and using their own stories to craft a recovery path.    Tony Cunningham

## 2017-02-27 LAB — GLUCOSE, CAPILLARY
GLUCOSE-CAPILLARY: 173 mg/dL — AB (ref 65–99)
GLUCOSE-CAPILLARY: 302 mg/dL — AB (ref 65–99)
Glucose-Capillary: 83 mg/dL (ref 65–99)
Glucose-Capillary: 140 mg/dL — ABNORMAL HIGH (ref 65–99)

## 2017-02-27 NOTE — Plan of Care (Signed)
Problem: Safety: Goal: Periods of time without injury will increase Outcome: Progressing Patient is on q15 minute safety checks and low fall risk precautions. Patient contracts for safety on the unit and remains safe at this time.   

## 2017-02-27 NOTE — Progress Notes (Signed)
Inpatient Diabetes Program Recommendations  AACE/ADA: New Consensus Statement on Inpatient Glycemic Control (2015)  Target Ranges:  Prepandial:   less than 140 mg/dL      Peak postprandial:   less than 180 mg/dL (1-2 hours)      Critically ill patients:  140 - 180 mg/dL   Lab Results  Component Value Date   GLUCAP 302 (H) 02/27/2017   HGBA1C 7.6 (H) 01/19/2017    Review of Glycemic ControlResults for JASER, FULLEN (MRN [id number]) as of 02/27/2017 20:16  Ref. Range 02/26/2017 21:39 02/27/2017 06:06 02/27/2017 11:58 02/27/2017 17:01 02/27/2017 20:00  Glucose-Capillary Latest Ref Range: 65 - 99 mg/dL 696 (H) 83 295 (H) 284 (H) 302 (H)    Diabetes history: DM Outpatient Diabetes medications: Novolog 6 units tid with meals, Lantus 45 units q HS, Metformin 1000 mg bid Current orders for Inpatient glycemic control:  Novolog moderate tid with meals and HS, Lantus 40 units q HS, Novolog 5 units tid with meals  Inpatient Diabetes Program Recommendations:    Note fluctuations in blood sugars.  It appears that fasting blood sugars are okay, however blood sugars vary greatly after meals.  Patient did not eat breakfast or lunch according to documentation, however appears to have eaten supper and blood sugars increased to 302 mg/dL.  Please consider increasing Novolog meal coverage to 8 units tid with meals however it is very important that this be held if patient does not eat or eats less than 50%.    Thanks, Beryl Meager, RN, BC-ADM Inpatient Diabetes Coordinator Pager [phone] (8a-5p)

## 2017-02-27 NOTE — Progress Notes (Signed)
Nursing Progress Note [id number]  D) Patient presents with a flat affect. Patient is up in the milieu and calm/cooperative. Patient CBG elevated this evening, patient medicated as prescribed. Carb modified diet reviewed with patient; patient needs reinforcement. Patient denies SI/HI/AVH or pain. Patient contracts for safety on the unit. Patient compliant with medications. Patient affect brightens upon interaction. Patient requested second Trazodone this evening.  A) Emotional support given. 1:1 interaction and active listening provided. Patient medicated as prescribed. Medications and plan of care reviewed with patient. Patient verbalized understanding without further questions. Snacks and fluids provided. Opportunities for questions or concerns presented to patient. Patient encouraged to continue to work on treatment goals. Labs, vital signs and patient behavior monitored throughout shift. Patient safety maintained with q15 min safety checks. Low fall risk precautions in place and reviewed with patient; patient verbalized understanding.  R) Patient receptive to interaction with nurse. Patient remains safe on the unit at this time. Patient denies any adverse medication reactions at this time. Patient is resting in bed without complaints. Will continue to monitor.

## 2017-02-27 NOTE — Progress Notes (Signed)
Patient ID: Tony Cunningham, male   DOB: February 07, 1976, 40 y.o.   MRN: [id number]    D: Pt has been very flat and depressed on the unit today, he did not attend any groups nor did he engage in treatment. Pt did not eat breakfast or lunch. Pt reported that he was not feeling well, all vitals were WNL. Pt reported that his depression was a 4, his hopelessness was a 4, and his anxiety was a 5. Pt reported that he had no goal for today. Pt reported being negative SI/HI, no AH/VH noted. A: 15 min checks continued for patient safety. R: Pt safety maintained.

## 2017-02-27 NOTE — Progress Notes (Signed)
[facility] MD Progress Note  02/27/2017 1:28 PM Tony Cunningham  MRN:  [id number]   Subjective: Patient states " I am still hearing voices.'   Objective: Patient seen and chart reviewed.Discussed patient with treatment team.  Pt today seen as anxious , continues to report AH. Pt states AH is command asks him to hurt self. He contracts for safety on the unit. Pt reports he continues to feel sad, however his appetite , sleep has improved. Per RN - continues to need a lot of encouragement and support on the unit.Continue to have CBG variation- highs and lows - poor control . Will get diabetic consult.     Principal Problem: Schizoaffective disorder, depressive type (HCC) Diagnosis:   Patient Active Problem List   Diagnosis Date Noted  . 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 Schizoaffective disorder, depressive type (HCC) [F25.1] 09/23/2016  . Polysubstance abuse (HCC) [F19.10] 07/17/2016  . Tardive dyskinesia [G24.01] 07/16/2016  . Tobacco use disorder [F17.200] 07/07/2016  . Dyslipidemia [E78.5] 07/07/2016  . Asthma [J45.909] 07/07/2016  . HTN (hypertension) [I10] 07/06/2016  . Diabetes (HCC) [E11.9] 12/25/2010   Total Time spent with patient: 20 minutes  Past Psychiatric History: Patient with long standing hx of mental illness as well as multiple hospitalziations . Recently was at [facility] , prior to that at [facility]. Pt was receiving ECT treatments at [facility] , was supposed to return for maintenance treatment on the 28 th of this month. Patient has a history of cutting behaviors and suicidal attempts.  Past Medical History:  Past Medical History:  Diagnosis Date  . Anxiety   . Asthma   . Diabetes mellitus   . High blood pressure   . Sinus complaint    History reviewed. No pertinent surgical history.  Family History: History reviewed. No pertinent family history.  Family Psychiatric  History: Unknown  Social History:  History  Alcohol Use No     History  Drug Use  No    Social History   Social History  . Marital status: Single    Spouse name: [name]  . Number of children: [name]  . Years of education: [name]   Social History Main Topics  . Smoking status: Current Every Day Smoker    Packs/day: 0.50    Types: Cigarettes  . Smokeless tobacco: Never Used  . Alcohol use No  . Drug use: No  . Sexual activity: Not Currently   Other Topics Concern  . None   Social History Narrative  . None   Additional Social History:    Pain Medications: See MAR Prescriptions: See MAR Over the Counter: See MAR History of alcohol / drug use?: No history of alcohol / drug abuse  Sleep: Fair  Appetite:  Fair  Current Medications: Current Facility-Administered Medications  Medication Dose Route Frequency Provider Last Rate Last Dose  . acetaminophen (TYLENOL) tablet 650 mg  650 mg Oral Q6H PRN Nira Conn A, NP   650 mg at 02/19/17 2129  . alum & mag hydroxide-simeth (MAALOX/MYLANTA) 200-200-20 MG/5ML suspension 30 mL  30 mL Oral Q4H PRN Nira Conn A, NP      . cloZAPine (CLOZARIL) tablet 150 mg  150 mg Oral QHS Nira Conn A, NP   150 mg at 02/26/17 2104  . cloZAPine (CLOZARIL) tablet 50 mg  50 mg Oral Daily Hisada, Barbee Cough, MD   50 mg at 02/27/17 0840  . FLUoxetine (PROZAC) capsule 40 mg  40 mg Oral Daily Jackelyn Poling, NP   40  mg at 02/27/17 0841  . insulin aspart (novoLOG) injection 0-15 Units  0-15 Units Subcutaneous TID WC Jomarie Longs, MD   5 Units at 02/26/17 1816  . insulin aspart (novoLOG) injection 0-5 Units  0-5 Units Subcutaneous QHS Jomarie Longs, MD   5 Units at 02/26/17 2101  . insulin aspart (novoLOG) injection 5 Units  5 Units Subcutaneous TID WC Jerald Kief, MD   5 Units at 02/26/17 1817  . insulin glargine (LANTUS) injection 40 Units  40 Units Subcutaneous QHS Nira Conn A, NP   40 Units at 02/26/17 2102  . lisinopril (PRINIVIL,ZESTRIL) tablet 20 mg  20 mg Oral Daily Nira Conn A, NP   20 mg at 02/27/17 0841  . magnesium  hydroxide (MILK OF MAGNESIA) suspension 30 mL  30 mL Oral Daily PRN Nira Conn A, NP      . metFORMIN (GLUCOPHAGE) tablet 1,000 mg  1,000 mg Oral BID WC Nira Conn A, NP   1,000 mg at 02/27/17 0841  . montelukast (SINGULAIR) tablet 10 mg  10 mg Oral QHS Nira Conn A, NP   10 mg at 02/26/17 2104  . OLANZapine zydis (ZYPREXA) disintegrating tablet 5 mg  5 mg Oral TID PRN Jomarie Longs, MD   5 mg at 02/19/17 2133   Or  . OLANZapine (ZYPREXA) injection 5 mg  5 mg Intramuscular TID PRN Joycie Aerts, MD      . pantoprazole (PROTONIX) EC tablet 40 mg  40 mg Oral Daily Nira Conn A, NP   40 mg at 02/27/17 0841  . propranolol (INDERAL) tablet 20 mg  20 mg Oral BID Nira Conn A, NP   20 mg at 02/27/17 0841  . traZODone (DESYREL) tablet 50 mg  50 mg Oral QHS,MR X 1 Nira Conn A, NP   50 mg at 02/26/17 2103   Lab Results:  Results for orders placed or performed during the hospital encounter of 02/14/17 (from the past 48 hour(s))  Glucose, capillary     Status: Abnormal   Collection Time: 02/25/17  4:56 PM  Result Value Ref Range   Glucose-Capillary 208 (H) 65 - 99 mg/dL   Comment 1 Notify RN    Comment 2 Document in Chart   Glucose, capillary     Status: Abnormal   Collection Time: 02/25/17  7:50 PM  Result Value Ref Range   Glucose-Capillary 272 (H) 65 - 99 mg/dL   Comment 1 Notify RN   Glucose, capillary     Status: Abnormal   Collection Time: 02/26/17  6:15 AM  Result Value Ref Range   Glucose-Capillary 106 (H) 65 - 99 mg/dL  Glucose, capillary     Status: None   Collection Time: 02/26/17 12:14 PM  Result Value Ref Range   Glucose-Capillary 73 65 - 99 mg/dL  Glucose, capillary     Status: Abnormal   Collection Time: 02/26/17  5:06 PM  Result Value Ref Range   Glucose-Capillary 224 (H) 65 - 99 mg/dL   Comment 1 Notify RN    Comment 2 Document in Chart   Glucose, capillary     Status: Abnormal   Collection Time: 02/26/17  8:32 PM  Result Value Ref Range    Glucose-Capillary 406 (H) 65 - 99 mg/dL  Glucose, capillary     Status: Abnormal   Collection Time: 02/26/17  9:39 PM  Result Value Ref Range   Glucose-Capillary 377 (H) 65 - 99 mg/dL  Glucose, capillary     Status: None  Collection Time: 02/27/17  6:06 AM  Result Value Ref Range   Glucose-Capillary 83 65 - 99 mg/dL  Glucose, capillary     Status: Abnormal   Collection Time: 02/27/17 11:58 AM  Result Value Ref Range   Glucose-Capillary 140 (H) 65 - 99 mg/dL   Blood Alcohol level:  Lab Results  Component Value Date   ETH <5 01/16/2017   ETH <5 12/21/2016   Metabolic Disorder Labs: Lab Results  Component Value Date   HGBA1C 7.6 (H) 01/19/2017   MPG 171.42 01/19/2017   MPG 249 11/09/2016   Lab Results  Component Value Date   PROLACTIN 24.5 (H) 09/24/2016   PROLACTIN 3.4 (L) 07/07/2016   Lab Results  Component Value Date   CHOL 122 01/19/2017   TRIG 154 (H) 01/19/2017   HDL 36 (L) 01/19/2017   CHOLHDL 3.4 01/19/2017   VLDL 31 01/19/2017   LDLCALC 55 01/19/2017   LDLCALC 59 12/28/2016   Physical Findings: AIMS: Facial and Oral Movements Muscles of Facial Expression: None, normal Lips and Perioral Area: None, normal Jaw: None, normal Tongue: None, normal,Extremity Movements Upper (arms, wrists, hands, fingers): None, normal Lower (legs, knees, ankles, toes): None, normal, Trunk Movements Neck, shoulders, hips: None, normal, Overall Severity Severity of abnormal movements (highest score from questions above): None, normal Incapacitation due to abnormal movements: None, normal Patient's awareness of abnormal movements (rate only patient's report): No Awareness, Dental Status Current problems with teeth and/or dentures?: No Does patient usually wear dentures?: No  CIWA:  CIWA-Ar Total: 1 COWS:  COWS Total Score: 1  Musculoskeletal: Strength & Muscle Tone: within normal limits Gait & Station: normal Patient leans: [name]  Psychiatric Specialty Exam: Physical Exam   Nursing note and vitals reviewed.   Review of Systems  Unable to perform ROS: Psychiatric disorder  Psychiatric/Behavioral: Positive for depression and hallucinations. The patient is nervous/anxious.   All other systems reviewed and are negative.   Blood pressure 112/81, pulse 96, temperature 98.6 F (37 C), temperature source Oral, resp. rate 16, height 6' (1.829 m), weight 76.7 kg (169 lb).Body mass index is 22.92 kg/m.  General Appearance: Guarded   Eye Contact:  Fair  Speech:  Slow  Volume:  Decreased,  Mood:  Anxious  Affect:  Appropriate   Thought Process:  Goal Directed and Descriptions of Associations: Circumstantial   Orientation:  Other:  self, situation, place  Thought Content:  Hallucinations: Auditory Command:  kill self - on and off, Paranoid Ideation and Rumination   Suicidal Thoughts:  No  Homicidal Thoughts:  No  Memory:  Immediate;   Fair Recent;   Fair Remote;   Poor  Judgement:  Impaired  Insight:  Lacking  Psychomotor Activity:  Decreased  Concentration:  Concentration: Fair and Attention Span: Fair  Recall:  [employer] of Knowledge:  Fair  Language:  Fair  Akathisia:  No  Handed:  Right  AIMS (if indicated):   denies tremors , some rigidity - although improved  Assets:  Desire for Improvement  ADL's:  Intact  Cognition:  WNL  Sleep:  Number of Hours: 5.75      Treatment Summary:Patient with schizoaffective do , presented as disorganized , thought blocking and had SI/HI , is S/P ECT treatments most recently in September . Pt however had cognitive issues and did not have much benefit from the same.  Pt continues to have psychosis , depressive sx - although with some improvement.  Will continue treatment. Daily contact with patient to  assess and evaluate symptoms and progress in treatment.  Will continue today 02/27/17 plan as below except where it is noted.  - Continue clozapine 150 mg po  qhs, 50 mg po  daily (Confirmed patient registration at  [facility]) - EKG reviewed; NSR, QTc 413 msec with is within normal range.  - Discussed with nurse to monitor bowel movement.  - Continue Fluoxetine 40 mg po daily for depression - Continue propranolol 20 mg po BID for anxiety/fine tremors. - Continue Trazodone 50 mg po q hs for insomnia. -Order diabetic consult . -CSW will continue to work on disposition.   Jomarie Longs, MD, 02/27/2017, 1:28 PM\

## 2017-02-27 NOTE — [facility] Group Notes (Signed)
  [facility] LCSW Group Therapy Note  Date/Time:  02/27/2017 11:15AM-12:00PM  Type of Therapy and Topic:  Group Therapy:  Feelings About Hospitalization  Participation Level:  Did Not Attend   Description of Group This process group involved patients discussing their feelings related to being hospitalized, as well as the benefits they see to being in the hospital.  These feelings and benefits were itemized.  The group then brainstormed specific ways in which they could seek those same benefits when they discharge and return home.  Therapeutic Goals 1. Patient will identify and describe positive and negative feelings related to hospitalization 2. Patient will verbalize benefits of hospitalization to themselves personally 3. Patients will brainstorm together ways they can obtain similar benefits in the outpatient setting, identify barriers to wellness and possible solutions  Summary of Patient Progress:  [name]  Therapeutic Modalities Cognitive Behavioral Therapy Motivational Interviewing    Ambrose Mantle, LCSW 02/27/2017, 9:51 AM

## 2017-02-28 LAB — GLUCOSE, CAPILLARY
GLUCOSE-CAPILLARY: 119 mg/dL — AB (ref 65–99)
Glucose-Capillary: 98 mg/dL (ref 65–99)
Glucose-Capillary: 167 mg/dL — ABNORMAL HIGH (ref 65–99)
Glucose-Capillary: 195 mg/dL — ABNORMAL HIGH (ref 65–99)

## 2017-02-28 NOTE — [facility] Group Notes (Signed)
[facility] LCSW Group Therapy Note  Date/Time:  02/28/2017  11:00AM-12:00PM  Type of Therapy and Topic:  Group Therapy:  Music and Mood  Participation Level:  Did Not Attend   Description of Group: In this process group, members listened to a variety of genres of music and identified that different types of music evoke different responses.  Patients were encouraged to identify music that was soothing for them and music that was energizing for them.  Patients discussed how this knowledge can help with wellness and recovery in various ways including managing depression and anxiety as well as encouraging healthy sleep habits.    Therapeutic Goals: 1. Patients will explore the impact of different varieties of music on mood 2. Patients will verbalize the thoughts they have when listening to different types of music 3. Patients will identify music that is soothing to them as well as music that is energizing to them 4. Patients will discuss how to use this knowledge to assist in maintaining wellness and recovery 5. Patients will explore the use of music as a coping skill  Summary of Patient Progress:  [name]  Therapeutic Modalities: Solution Focused Brief Therapy Motivational Interviewing Activity   Tony Mantle, LCSW 02/28/2017 8:31 AM

## 2017-02-28 NOTE — Progress Notes (Signed)
Patient ID: Tony Cunningham, male   DOB: October 25, 1975, 41 y.o.   MRN: [id number]    D: Pt has been very flat and depressed on the unit today, he did not attend any groups nor did he engage in treatment. Pt did not eat breakfast or lunch. Pt reported that he was not feeling well, all vitals were WNL. Pt reported that his depression was a 5, his hopelessness was a 5, and his anxiety was a 5. Pt reported that he had no goal for today, and refused to fill out his patient self inventory sheet. Pt reported being negative SI/HI, no AH/VH noted. A: 15 min checks continued for patient safety. R: Pt safety maintained.

## 2017-02-28 NOTE — Progress Notes (Signed)
Nursing Progress Note [id number]  D)Patient presents with a flat affect. Patient is up in the milieu and calm/cooperative. Patient CBG 195 this evening, no sliding scale coverage needed. Patient medicated as prescribed. Carb modified diet reviewed with patient; patient needs reinforcement but is agreeable with limits set by staff.Patient denies SI/HI/AVH or pain. Patient contracts for safety on the unit. Patient compliant with medications. Patient affect brightens upon interaction.  A)Emotional support given. 1:1 interaction and active listening provided. Patient medicated as prescribed. Medications and plan of care reviewed with patient. Patient verbalized understanding without further questions. Snacks and fluids provided. Opportunities for questions or concerns presented to patient. Patient encouraged to continue to work on treatment goals. Labs, vital signs and patient behavior monitored throughout shift. Patient safety maintained with q15 min safety checks. Low fall risk precautions in place and reviewed with patient; patient verbalized understanding.  R) Patient receptive to interaction with nurse. Patient remains safe on the unit at this time. Patient denies any adverse medication reactions at this time. Patient is resting in bed without complaints. Will continue to monitor.

## 2017-02-28 NOTE — [facility] Counselor (Signed)
Clinical Social Work Note  With use of phone interpreter [phone], 30-minute phone call was held with patient's mother to discuss him going to a group home.  Mother stated she does not know of any particular reason that pt has been struggling recently, but said when he gets home to her house, he wants almost immediately to go back to the hospital.  She tries to get him to "give it time" and encourages him to wait, but he just walks out of the house and starts walking to the hospital.  She stated that she gives him his medications herself, so she knows that he stays on them.  She talked frequently of "Dois Davenport," a staff person at his most recent program, and how she said he was difficult to work with, and this made pt protest and tearful.   She gave her daughter Jocelyn's number in case more information about this is needed, [phone].  Mother is willing for pt to go to a group home and was given the owner's name and phone number to follow up.  She definitely feels he needs more assistance than she can provide currently due to her work schedule.  She reiterated her love and support for him numerous times, and said when he is stable he can then come home.  Ambrose Mantle, LCSW 02/28/2017, 3:56 PM

## 2017-02-28 NOTE — Progress Notes (Signed)
[facility] Group Notes:  (Nursing/MHT/Case Management/Adjunct)  Date:  02/28/2017  Time: 2030  Type of Therapy:  wrap up group  Participation Level:  Minimal  Participation Quality:  Attentive  Affect:  Flat  Cognitive:  Lacking  Insight:  Lacking  Engagement in Group:  Limited  Modes of Intervention:  Clarification, Education and Support  Summary of Progress/Problems: Pt shared that he spoke to the doctor today about ECT and believes it would be a good thing for him. Pt was unable to come up with something he would change about his life if he could, pt replied "I have no idea".  Marcille Buffy 02/28/2017, 8:44 PM

## 2017-02-28 NOTE — Progress Notes (Signed)
[facility] MD Progress Note  02/28/2017 10:29 AM Tony Cunningham  MRN:  [id number]   Subjective: Tony Cunningham reports " I am feeling a lot better today"   Objective: Tony Cunningham is awake, alert. Seen resting in bed. Patient present with a flat and depressed affect/mood. Per staff patient continues to isolate to his room. Patient is taken medications as prescribed and tolerating medications well. Patient is not motivated to initiate ADL's or attend group sessions. Denies suicidal or homicidal ideation.Support, encouragement and reassurance was provided.     Principal Problem: Schizoaffective disorder, depressive type (HCC) Diagnosis:   Patient Active Problem List   Diagnosis Date Noted  . 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 Schizoaffective disorder, depressive type (HCC) [F25.1] 09/23/2016  . Polysubstance abuse (HCC) [F19.10] 07/17/2016  . Tardive dyskinesia [G24.01] 07/16/2016  . Tobacco use disorder [F17.200] 07/07/2016  . Dyslipidemia [E78.5] 07/07/2016  . Asthma [J45.909] 07/07/2016  . HTN (hypertension) [I10] 07/06/2016  . Diabetes (HCC) [E11.9] 12/25/2010   Total Time spent with patient: 20 minutes  Past Psychiatric History: Patient with long standing hx of mental illness as well as multiple hospitalziations . Recently was at [facility] , prior to that at [facility]. Pt was receiving ECT treatments at [facility] , was supposed to return for maintenance treatment on the 28 th of this month. Patient has a history of cutting behaviors and suicidal attempts.  Past Medical History:  Past Medical History:  Diagnosis Date  . Anxiety   . Asthma   . Diabetes mellitus   . High blood pressure   . Sinus complaint    History reviewed. No pertinent surgical history.  Family History: History reviewed. No pertinent family history.  Family Psychiatric  History: Unknown  Social History:  History  Alcohol Use No     History  Drug Use No    Social History   Social History  . Marital  status: Single    Spouse name: [name]  . Number of children: [name]  . Years of education: [name]   Social History Main Topics  . Smoking status: Current Every Day Smoker    Packs/day: 0.50    Types: Cigarettes  . Smokeless tobacco: Never Used  . Alcohol use No  . Drug use: No  . Sexual activity: Not Currently   Other Topics Concern  . None   Social History Narrative  . None   Additional Social History:    Pain Medications: See MAR Prescriptions: See MAR Over the Counter: See MAR History of alcohol / drug use?: No history of alcohol / drug abuse  Sleep: Fair  Appetite:  Fair  Current Medications: Current Facility-Administered Medications  Medication Dose Route Frequency Provider Last Rate Last Dose  . acetaminophen (TYLENOL) tablet 650 mg  650 mg Oral Q6H PRN Nira Conn A, NP   650 mg at 02/19/17 2129  . alum & mag hydroxide-simeth (MAALOX/MYLANTA) 200-200-20 MG/5ML suspension 30 mL  30 mL Oral Q4H PRN Nira Conn A, NP      . cloZAPine (CLOZARIL) tablet 150 mg  150 mg Oral QHS Nira Conn A, NP   150 mg at 02/27/17 2032  . cloZAPine (CLOZARIL) tablet 50 mg  50 mg Oral Daily Hisada, Reina, MD   50 mg at 02/28/17 1005  . FLUoxetine (PROZAC) capsule 40 mg  40 mg Oral Daily Nira Conn A, NP   40 mg at 02/28/17 1006  . insulin aspart (novoLOG) injection 0-15 Units  0-15 Units Subcutaneous TID WC Jomarie Longs, MD  3 Units at 02/27/17 1800  . insulin aspart (novoLOG) injection 0-5 Units  0-5 Units Subcutaneous QHS Jomarie Longs, MD   4 Units at 02/27/17 2036  . insulin aspart (novoLOG) injection 5 Units  5 Units Subcutaneous TID WC Jerald Kief, MD   5 Units at 02/27/17 1800  . insulin glargine (LANTUS) injection 40 Units  40 Units Subcutaneous QHS Nira Conn A, NP   40 Units at 02/27/17 2036  . lisinopril (PRINIVIL,ZESTRIL) tablet 20 mg  20 mg Oral Daily Nira Conn A, NP   20 mg at 02/28/17 1007  . magnesium hydroxide (MILK OF MAGNESIA) suspension 30 mL  30 mL Oral  Daily PRN Nira Conn A, NP      . metFORMIN (GLUCOPHAGE) tablet 1,000 mg  1,000 mg Oral BID WC Nira Conn A, NP   1,000 mg at 02/28/17 1005  . montelukast (SINGULAIR) tablet 10 mg  10 mg Oral QHS Nira Conn A, NP   10 mg at 02/27/17 2032  . OLANZapine zydis (ZYPREXA) disintegrating tablet 5 mg  5 mg Oral TID PRN Jomarie Longs, MD   5 mg at 02/19/17 2133   Or  . OLANZapine (ZYPREXA) injection 5 mg  5 mg Intramuscular TID PRN Eappen, Saramma, MD      . pantoprazole (PROTONIX) EC tablet 40 mg  40 mg Oral Daily Nira Conn A, NP   40 mg at 02/28/17 1006  . propranolol (INDERAL) tablet 20 mg  20 mg Oral BID Nira Conn A, NP   20 mg at 02/28/17 1005  . traZODone (DESYREL) tablet 50 mg  50 mg Oral QHS,MR X 1 Nira Conn A, NP   50 mg at 02/27/17 2211   Lab Results:  Results for orders placed or performed during the hospital encounter of 02/14/17 (from the past 48 hour(s))  Glucose, capillary     Status: None   Collection Time: 02/26/17 12:14 PM  Result Value Ref Range   Glucose-Capillary 73 65 - 99 mg/dL  Glucose, capillary     Status: Abnormal   Collection Time: 02/26/17  5:06 PM  Result Value Ref Range   Glucose-Capillary 224 (H) 65 - 99 mg/dL   Comment 1 Notify RN    Comment 2 Document in Chart   Glucose, capillary     Status: Abnormal   Collection Time: 02/26/17  8:32 PM  Result Value Ref Range   Glucose-Capillary 406 (H) 65 - 99 mg/dL  Glucose, capillary     Status: Abnormal   Collection Time: 02/26/17  9:39 PM  Result Value Ref Range   Glucose-Capillary 377 (H) 65 - 99 mg/dL  Glucose, capillary     Status: None   Collection Time: 02/27/17  6:06 AM  Result Value Ref Range   Glucose-Capillary 83 65 - 99 mg/dL  Glucose, capillary     Status: Abnormal   Collection Time: 02/27/17 11:58 AM  Result Value Ref Range   Glucose-Capillary 140 (H) 65 - 99 mg/dL  Glucose, capillary     Status: Abnormal   Collection Time: 02/27/17  5:01 PM  Result Value Ref Range    Glucose-Capillary 173 (H) 65 - 99 mg/dL  Glucose, capillary     Status: Abnormal   Collection Time: 02/27/17  8:00 PM  Result Value Ref Range   Glucose-Capillary 302 (H) 65 - 99 mg/dL  Glucose, capillary     Status: Abnormal   Collection Time: 02/28/17  6:05 AM  Result Value Ref Range   Glucose-Capillary 119 (  H) 65 - 99 mg/dL   Blood Alcohol level:  Lab Results  Component Value Date   ETH <5 01/16/2017   ETH <5 12/21/2016   Metabolic Disorder Labs: Lab Results  Component Value Date   HGBA1C 7.6 (H) 01/19/2017   MPG 171.42 01/19/2017   MPG 249 11/09/2016   Lab Results  Component Value Date   PROLACTIN 24.5 (H) 09/24/2016   PROLACTIN 3.4 (L) 07/07/2016   Lab Results  Component Value Date   CHOL 122 01/19/2017   TRIG 154 (H) 01/19/2017   HDL 36 (L) 01/19/2017   CHOLHDL 3.4 01/19/2017   VLDL 31 01/19/2017   LDLCALC 55 01/19/2017   LDLCALC 59 12/28/2016   Physical Findings: AIMS: Facial and Oral Movements Muscles of Facial Expression: None, normal Lips and Perioral Area: None, normal Jaw: None, normal Tongue: None, normal,Extremity Movements Upper (arms, wrists, hands, fingers): None, normal Lower (legs, knees, ankles, toes): None, normal, Trunk Movements Neck, shoulders, hips: None, normal, Overall Severity Severity of abnormal movements (highest score from questions above): None, normal Incapacitation due to abnormal movements: None, normal Patient's awareness of abnormal movements (rate only patient's report): No Awareness, Dental Status Current problems with teeth and/or dentures?: No Does patient usually wear dentures?: No  CIWA:  CIWA-Ar Total: 1 COWS:  COWS Total Score: 1  Musculoskeletal: Strength & Muscle Tone: within normal limits Gait & Station: normal Patient leans: [name]  Psychiatric Specialty Exam: Physical Exam  Nursing note and vitals reviewed. Constitutional: He appears well-developed.    Review of Systems  Unable to perform ROS: Psychiatric  disorder  Psychiatric/Behavioral: Positive for depression and hallucinations. The patient is nervous/anxious.     Blood pressure 122/68, pulse (!) 101, temperature 98.3 F (36.8 C), temperature source Oral, resp. rate 18, height 6' (1.829 m), weight 76.7 kg (169 lb).Body mass index is 22.92 kg/m.  General Appearance: Guarded malodorous, flat and depressed   Eye Contact:  Fair  Speech:  Slow  Volume:  Decreased,  Mood:  Anxious  Affect:  Blunt, Depressed and Flat   Thought Process:  Goal Directed and Descriptions of Associations: Circumstantial   Orientation:  Other:  self, situation, place  Thought Content:  Hallucinations: Auditory Command:  kill self - on and off, Paranoid Ideation and Rumination   Suicidal Thoughts:  No  Homicidal Thoughts:  No  Memory:  Immediate;   Fair Recent;   Fair Remote;   Poor  Judgement:  Impaired  Insight:  Lacking  Psychomotor Activity:  Decreased  Concentration:  Concentration: Fair and Attention Span: Fair  Recall:  [employer] of Knowledge:  Fair  Language:  Fair  Akathisia:  No  Handed:  Right  AIMS (if indicated):     Assets:  Desire for Improvement  ADL's:  Intact  Cognition:  WNL  Sleep:  Number of Hours: 5     I agree with current treatment plan on 02/28/2017, Patient seen face-to-face for psychiatric evaluation follow-up, chart reviewed. Reviewed the information documented and agree with the treatment plan.  Will continue treatment. Daily contact with patient to assess and evaluate symptoms and progress in treatment.  Will continue today 02/28/17 plan as below except where it is noted.  - Continue clozapine 150 mg po  qhs, 50 mg po  daily (Confirmed patient registration at [facility]) - EKG reviewed; NSR, QTc 413 msec with is within normal range.  - Discussed with nurse to monitor bowel movement.  - Continue Fluoxetine 40 mg po daily for depression -  Continue propranolol 20 mg po BID for anxiety/fine tremors. - Continue Trazodone 50 mg  po q hs for insomnia. -Order diabetic consult . -CSW will continue to work on disposition.   Oneta Rack, NP, 02/28/2017, 10:29 AM\ Agree with NP Progress Note

## 2017-02-28 NOTE — Progress Notes (Signed)
Adult Psychoeducational Group Note  Date:  02/28/2017 Time:  1:49 AM  Group Topic/Focus:  Wrap-Up Group:   The focus of this group is to help patients review their daily goal of treatment and discuss progress on daily workbooks.  Participation Level:  Active  Participation Quality:  Appropriate  Affect:  Appropriate  Cognitive:  Appropriate  Insight: Appropriate  Engagement in Group:  Engaged  Modes of Intervention:  Discussion  Additional Comments:  Pt stated his goal for today was to attend all groups held today. Pt stated he accomplished his goal today. Pt rated his day a 9 out of 10.  Tony Cunningham 02/28/2017, 1:49 AM

## 2017-02-28 NOTE — Plan of Care (Signed)
Problem: Education: Goal: Verbalization of understanding the information provided will improve Outcome: Progressing Patient understanding of limits set by staff for diabetic nutrition compliance. Patient provided sugar free snacks and water this evening.

## 2017-03-01 LAB — GLUCOSE, CAPILLARY
GLUCOSE-CAPILLARY: 169 mg/dL — AB (ref 65–99)
GLUCOSE-CAPILLARY: 189 mg/dL — AB (ref 65–99)
Glucose-Capillary: 91 mg/dL (ref 65–99)
Glucose-Capillary: 218 mg/dL — ABNORMAL HIGH (ref 65–99)

## 2017-03-01 NOTE — [facility] Group Notes (Signed)
LCSW Group Therapy Note   03/01/2017 1:15pm   Type of Therapy and Topic:  Group Therapy:  Overcoming Obstacles   Participation Level:  Did Not Attend   Description of Group:    In this group patients will be encouraged to explore what they see as obstacles to their own wellness and recovery. They will be guided to discuss their thoughts, feelings, and behaviors related to these obstacles. The group will process together ways to cope with barriers, with attention given to specific choices patients can make. Each patient will be challenged to identify changes they are motivated to make in order to overcome their obstacles. This group will be process-oriented, with patients participating in exploration of their own experiences as well as giving and receiving support and challenge from other group members.   Therapeutic Goals: 1. Patient will identify personal and current obstacles as they relate to admission. 2. Patient will identify barriers that currently interfere with their wellness or overcoming obstacles.  3. Patient will identify feelings, thought process and behaviors related to these barriers. 4. Patient will identify two changes they are willing to make to overcome these obstacles:      Summary of Patient Progress      Therapeutic Modalities:   Cognitive Behavioral Therapy Solution Focused Therapy Motivational Interviewing Relapse Prevention Therapy  Ida Rogue, LCSW 03/01/2017 3:21 PM

## 2017-03-01 NOTE — Progress Notes (Signed)
Patient asking to see Dr. Jama Flavors. "He mentioned me going home on Thursday but I don't think I will be ready. How about Saturday? I promise I will go then." Patient standing at the hall doors, watchful. Encouraged patient to speak with MD tomorrow and to remember to take it day by day as he continues to make progress. Patient verbalized understanding.

## 2017-03-01 NOTE — Plan of Care (Signed)
Problem: Education: Goal: Knowledge of [facility] Education information/materials will improve Outcome: Completed/Met Date Met: 03/01/17 Patient verbalizes understanding of [facility] policies, treatment regimen.  Problem: Medication: Goal: Compliance with prescribed medication regimen will improve Outcome: Completed/Met Date Met: 03/01/17 Patient has been med compliant. Verbalizes understanding of need to remain so upon discharge.

## 2017-03-01 NOTE — Progress Notes (Addendum)
[facility] MD Progress Note  03/01/2017 4:33 PM Tony Cunningham  MRN:  [id number]   Subjective: Tony Cunningham reports " I am so, so"    Objective: Tony Cunningham seen resting in bed. Patient was evaluated by NP and MD.- Discussed discharge and follow-up plan with patient. Patient reports he doesn't feel ready to discharge today or tomorrow, however is agreeable to [facility] or Thursday with apprehension and anxiety.    MD Rubie Ficco contacted MD [place] to restart ECT therapy on outpatient basis. Patient continues to need a lot of encouragement for ADL's and group attendances as reported by staff. CSW to follow-up with a schedule time for discharge. Support, encouragement and reassurance was provided.    Principal Problem: Schizoaffective disorder, depressive type (HCC) Diagnosis:   Patient Active Problem List   Diagnosis Date Noted  . 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 Schizoaffective disorder, depressive type (HCC) [F25.1] 09/23/2016  . Polysubstance abuse (HCC) [F19.10] 07/17/2016  . Tardive dyskinesia [G24.01] 07/16/2016  . Tobacco use disorder [F17.200] 07/07/2016  . Dyslipidemia [E78.5] 07/07/2016  . Asthma [J45.909] 07/07/2016  . HTN (hypertension) [I10] 07/06/2016  . Diabetes (HCC) [E11.9] 12/25/2010   Total Time spent with patient: 20 minutes  Past Psychiatric History: Patient with long standing hx of mental illness as well as multiple hospitalizations . Recently was at [facility] , prior to that at [facility]. Pt was receiving ECT treatments at [facility] , was supposed to return for maintenance treatment on the 28 th of this month. Patient has a history of cutting behaviors and suicidal attempts.  Past Medical History:  Past Medical History:  Diagnosis Date  . Anxiety   . Asthma   . Diabetes mellitus   . High blood pressure   . Sinus complaint    History reviewed. No pertinent surgical history.  Family History: History reviewed. No pertinent family history.  Family Psychiatric  History:  Unknown  Social History:  History  Alcohol Use No     History  Drug Use No    Social History   Social History  . Marital status: Single    Spouse name: [name]  . Number of children: [name]  . Years of education: [name]   Social History Main Topics  . Smoking status: Current Every Day Smoker    Packs/day: 0.50    Types: Cigarettes  . Smokeless tobacco: Never Used  . Alcohol use No  . Drug use: No  . Sexual activity: Not Currently   Other Topics Concern  . None   Social History Narrative  . None   Additional Social History:    Pain Medications: See MAR Prescriptions: See MAR Over the Counter: See MAR History of alcohol / drug use?: No history of alcohol / drug abuse  Sleep: Fair  Appetite:  Fair  Current Medications: Current Facility-Administered Medications  Medication Dose Route Frequency Provider Last Rate Last Dose  . acetaminophen (TYLENOL) tablet 650 mg  650 mg Oral Q6H PRN Nira Conn A, NP   650 mg at 02/19/17 2129  . alum & mag hydroxide-simeth (MAALOX/MYLANTA) 200-200-20 MG/5ML suspension 30 mL  30 mL Oral Q4H PRN Nira Conn A, NP      . cloZAPine (CLOZARIL) tablet 150 mg  150 mg Oral QHS Nira Conn A, NP   150 mg at 02/28/17 2100  . cloZAPine (CLOZARIL) tablet 50 mg  50 mg Oral Daily Hisada, Barbee Cough, MD   50 mg at 03/01/17 0819  . FLUoxetine (PROZAC) capsule 40 mg  40 mg Oral Daily  Nira Conn A, NP   40 mg at 03/01/17 0819  . insulin aspart (novoLOG) injection 0-15 Units  0-15 Units Subcutaneous TID WC Jomarie Longs, MD   Stopped at 03/01/17 1208  . insulin aspart (novoLOG) injection 0-5 Units  0-5 Units Subcutaneous QHS Jomarie Longs, MD   4 Units at 02/27/17 2036  . insulin aspart (novoLOG) injection 5 Units  5 Units Subcutaneous TID WC Jerald Kief, MD   5 Units at 03/01/17 1259  . insulin glargine (LANTUS) injection 40 Units  40 Units Subcutaneous QHS Nira Conn A, NP   40 Units at 02/28/17 2100  . lisinopril (PRINIVIL,ZESTRIL) tablet 20 mg  20  mg Oral Daily Nira Conn A, NP   20 mg at 03/01/17 0818  . magnesium hydroxide (MILK OF MAGNESIA) suspension 30 mL  30 mL Oral Daily PRN Nira Conn A, NP      . metFORMIN (GLUCOPHAGE) tablet 1,000 mg  1,000 mg Oral BID WC Nira Conn A, NP   1,000 mg at 03/01/17 0817  . montelukast (SINGULAIR) tablet 10 mg  10 mg Oral QHS Nira Conn A, NP   10 mg at 02/28/17 2100  . OLANZapine zydis (ZYPREXA) disintegrating tablet 5 mg  5 mg Oral TID PRN Jomarie Longs, MD   5 mg at 02/19/17 2133   Or  . OLANZapine (ZYPREXA) injection 5 mg  5 mg Intramuscular TID PRN Eappen, Saramma, MD      . pantoprazole (PROTONIX) EC tablet 40 mg  40 mg Oral Daily Nira Conn A, NP   40 mg at 03/01/17 0818  . propranolol (INDERAL) tablet 20 mg  20 mg Oral BID Nira Conn A, NP   20 mg at 03/01/17 0818  . traZODone (DESYREL) tablet 50 mg  50 mg Oral QHS,MR X 1 Nira Conn A, NP   50 mg at 02/28/17 2207   Lab Results:  Results for orders placed or performed during the hospital encounter of 02/14/17 (from the past 48 hour(s))  Glucose, capillary     Status: Abnormal   Collection Time: 02/27/17  5:01 PM  Result Value Ref Range   Glucose-Capillary 173 (H) 65 - 99 mg/dL  Glucose, capillary     Status: Abnormal   Collection Time: 02/27/17  8:00 PM  Result Value Ref Range   Glucose-Capillary 302 (H) 65 - 99 mg/dL  Glucose, capillary     Status: Abnormal   Collection Time: 02/28/17  6:05 AM  Result Value Ref Range   Glucose-Capillary 119 (H) 65 - 99 mg/dL  Glucose, capillary     Status: None   Collection Time: 02/28/17 12:01 PM  Result Value Ref Range   Glucose-Capillary 98 65 - 99 mg/dL  Glucose, capillary     Status: Abnormal   Collection Time: 02/28/17  4:47 PM  Result Value Ref Range   Glucose-Capillary 167 (H) 65 - 99 mg/dL  Glucose, capillary     Status: Abnormal   Collection Time: 02/28/17  7:40 PM  Result Value Ref Range   Glucose-Capillary 195 (H) 65 - 99 mg/dL  Glucose, capillary     Status:  Abnormal   Collection Time: 03/01/17  5:44 AM  Result Value Ref Range   Glucose-Capillary 169 (H) 65 - 99 mg/dL   Comment 1 Notify RN    Comment 2 Document in Chart   Glucose, capillary     Status: None   Collection Time: 03/01/17 12:05 PM  Result Value Ref Range   Glucose-Capillary 91 65 -  99 mg/dL   Comment 1 Notify RN    Comment 2 Document in Chart    Blood Alcohol level:  Lab Results  Component Value Date   ETH <5 01/16/2017   ETH <5 12/21/2016   Metabolic Disorder Labs: Lab Results  Component Value Date   HGBA1C 7.6 (H) 01/19/2017   MPG 171.42 01/19/2017   MPG 249 11/09/2016   Lab Results  Component Value Date   PROLACTIN 24.5 (H) 09/24/2016   PROLACTIN 3.4 (L) 07/07/2016   Lab Results  Component Value Date   CHOL 122 01/19/2017   TRIG 154 (H) 01/19/2017   HDL 36 (L) 01/19/2017   CHOLHDL 3.4 01/19/2017   VLDL 31 01/19/2017   LDLCALC 55 01/19/2017   LDLCALC 59 12/28/2016   Physical Findings: AIMS: Facial and Oral Movements Muscles of Facial Expression: None, normal Lips and Perioral Area: None, normal Jaw: None, normal Tongue: None, normal,Extremity Movements Upper (arms, wrists, hands, fingers): None, normal Lower (legs, knees, ankles, toes): None, normal, Trunk Movements Neck, shoulders, hips: None, normal, Overall Severity Severity of abnormal movements (highest score from questions above): None, normal Incapacitation due to abnormal movements: None, normal Patient's awareness of abnormal movements (rate only patient's report): No Awareness, Dental Status Current problems with teeth and/or dentures?: No Does patient usually wear dentures?: No  CIWA:  CIWA-Ar Total: 1 COWS:  COWS Total Score: 1  Musculoskeletal: Strength & Muscle Tone: within normal limits Gait & Station: normal Patient leans: [name]  Psychiatric Specialty Exam: Physical Exam  Nursing note and vitals reviewed. Constitutional: He is oriented to person, place, and time. He appears  well-developed.  Neurological: He is oriented to person, place, and time.  Psychiatric: He has a normal mood and affect.    Review of Systems  Unable to perform ROS: Psychiatric disorder  Psychiatric/Behavioral: Positive for depression, hallucinations and suicidal ideas. The patient is nervous/anxious.     Blood pressure 103/66, pulse (!) 102, temperature 98.3 F (36.8 C), temperature source Oral, resp. rate 18, height 6' (1.829 m), weight 76.7 kg (169 lb).Body mass index is 22.92 kg/m.  General Appearance: Guarded malodorous, flat and depressed   Eye Contact:  Fair  Speech:  Slow  Volume:  Decreased,  Mood:  Anxious  Affect:  Blunt, Depressed and Flat   Thought Process:  Goal Directed and Descriptions of Associations: Circumstantial   Orientation:  Other:  self, situation  Thought Content:  Paranoid Ideation and Rumination   Suicidal Thoughts:  No  Homicidal Thoughts:  No  Memory:  Immediate;   Fair Recent;   Fair Remote;   Poor  Judgement:  Impaired  Insight:  Lacking  Psychomotor Activity:  Decreased  Concentration:  Concentration: Fair and Attention Span: Fair  Recall:  [employer] of Knowledge:  Fair  Language:  Fair  Akathisia:  No  Handed:  Right  AIMS (if indicated):     Assets:  Desire for Improvement Financial Resources/Insurance Housing Physical Health Social Support  ADL's:  Intact  Cognition:  WNL  Sleep:  Number of Hours: 4.75     I agree with current treatment plan on 03/01/2017, Patient seen face-to-face for psychiatric evaluation follow-up, chart reviewed. Reviewed the information documented and agree with the treatment plan.  Will continue treatment. Daily contact with patient to assess and evaluate symptoms and progress in treatment.  Will continue today 03/01/17 plan as below except where it is noted.  - Continue clozapine 150 mg po  qhs, 50 mg po  daily (  Confirmed patient registration at [facility]) - EKG reviewed; NSR, QTc 413 msec with is within  normal range.  - Discussed with nurse to monitor bowel movement.  - Continue Fluoxetine 40 mg po daily for depression - Continue propranolol 20 mg po BID for anxiety/fine tremors. - Continue Trazodone 50 mg po q hs for insomnia. -Order diabetic consult . -CSW will continue to work on disposition.   Oneta Rack, NP, 03/01/2017, 4:33 PM\  Agree with NP Progress Note

## 2017-03-01 NOTE — Progress Notes (Signed)
D: Patient observed to be resting in bed this AM. Did come up medications when asked. Patient verbalizes to this Clinical research associate he continues to have AVH. "They tell me negative things and I see shadows." Denies that they are command and denies SI/HI. Patient's affect flat, anxious with anxious and depressed mood. Per self inventory and discussions with writer, rates depression, hopelessness and anxiety all at a 5/10. Rates sleep as good, appetite as good, energy as normal and concentration as good.  States goal for today is to "go to every group, take long shower." Denies pain, physical problems.   A: Medicated per orders, no prns requested or required. Level III obs in place for safety. Emotional support offered and self inventory reviewed. Encouraged programming participation. Discussed POC with MD, SW.    R: Patient verbalizes understanding of POC. Patient remains safe on level III obs. Will continue to monitor closely and make verbal contact frequently.

## 2017-03-01 NOTE — Progress Notes (Signed)
Recreation Therapy Notes  Date: 03/01/17 Time: 1000 Location: 500 Hall Dayroom  Group Topic: Coping Skills  Goal Area(s) Addresses:  Patients will be able to identify positive coping skills. Patients will be able to identify the benefits of using coping skills post d/c.  Intervention:  Worksheet, pencils  Activity: Mind map.  Patients were given a blank mind map.  LRT and patients filled in the first eight boxes together with things that would cause them to use coping skills.  Patients identified depression, anxiety, family, resentment, anguish, placement, tiredness and loneliness.  Patients were to then identify three coping skills for each area identified.  Education: Pharmacologist, Building control surveyor.   Education Outcome: Acknowledges understanding/In group clarification offered/Needs additional education.   Clinical Observations/Feedback: Pt did not attend group.    [name], LRT/CTRS         [name], Kasondra Junod A 03/01/2017 1:06 PM

## 2017-03-01 NOTE — Progress Notes (Signed)
Nursing Progress Note: 7p-7a D: Pt currently presents with a anxious/preoccupied affect and behavior. Pt states "Can the doctor discharge me on Saturday instead of Thursday? I need to be discharge Saturday instead." Interacting appropriately with the milieu. Pt reports ok sleep during the previous night with current medication regimen. Pt did attend wrap-up group.  A: Pt provided with medications per providers orders. Pt's labs and vitals were monitored throughout the night. Pt supported emotionally and encouraged to express concerns and questions. Pt educated on medications.  R: Pt's safety ensured with 15 minute and environmental checks. Pt currently denies SI and HI and endorses AVH. Pt verbally contracts to seek staff if SI,HI, or AVH occurs and to consult with staff before acting on any harmful thoughts. Will continue to monitor.

## 2017-03-02 LAB — CBC WITH DIFFERENTIAL/PLATELET
BASOS PCT: 1 %
Basophils Absolute: 0 10*3/uL (ref 0.0–0.1)
EOS ABS: 0.3 10*3/uL (ref 0.0–0.7)
EOS PCT: 3 %
HCT: 43.4 % (ref 39.0–52.0)
HEMOGLOBIN: 15.1 g/dL (ref 13.0–17.0)
Lymphocytes Relative: 31 %
Lymphs Abs: 2.4 10*3/uL (ref 0.7–4.0)
MCH: 30.2 pg (ref 26.0–34.0)
MCHC: 34.8 g/dL (ref 30.0–36.0)
MCV: 86.8 fL (ref 78.0–100.0)
MONO ABS: 0.5 10*3/uL (ref 0.1–1.0)
MONOS PCT: 7 %
NEUTROS PCT: 58 %
Neutro Abs: 4.6 10*3/uL (ref 1.7–7.7)
PLATELETS: 210 10*3/uL (ref 150–400)
RBC: 5 MIL/uL (ref 4.22–5.81)
RDW: 12.4 % (ref 11.5–15.5)
WBC: 7.8 10*3/uL (ref 4.0–10.5)

## 2017-03-02 LAB — COMPREHENSIVE METABOLIC PANEL
ALBUMIN: 4 g/dL (ref 3.5–5.0)
ALT: 20 U/L (ref 17–63)
ANION GAP: 12 (ref 5–15)
AST: 20 U/L (ref 15–41)
Alkaline Phosphatase: 77 U/L (ref 38–126)
BUN: 13 mg/dL (ref 6–20)
CHLORIDE: 93 mmol/L — AB (ref 101–111)
CO2: 27 mmol/L (ref 22–32)
Calcium: 8.9 mg/dL (ref 8.9–10.3)
Creatinine, Ser: 0.87 mg/dL (ref 0.61–1.24)
GFR calc Af Amer: >60 mL/min (ref >60)
GFR calc non Af Amer: >60 mL/min (ref >60)
Glucose, Bld: 410 mg/dL — ABNORMAL HIGH (ref 65–99)
POTASSIUM: 4.3 mmol/L (ref 3.5–5.1)
SODIUM: 132 mmol/L — AB (ref 135–145)
TOTAL PROTEIN: 6.8 g/dL (ref 6.5–8.1)
Total Bilirubin: 0.4 mg/dL (ref 0.3–1.2)

## 2017-03-02 LAB — GLUCOSE, CAPILLARY
GLUCOSE-CAPILLARY: 68 mg/dL (ref 65–99)
GLUCOSE-CAPILLARY: 400 mg/dL — AB (ref 65–99)
GLUCOSE-CAPILLARY: 259 mg/dL — AB (ref 65–99)
Glucose-Capillary: 57 mg/dL — ABNORMAL LOW (ref 65–99)
Glucose-Capillary: 357 mg/dL — ABNORMAL HIGH (ref 65–99)

## 2017-03-02 NOTE — [facility] Group Notes (Signed)
[facility] LCSW Group Therapy 03/02/2017 1:15pm  Type of Therapy: Group Therapy- Feelings Around Discharge & Establishing a Supportive Framework  Participation Level:  Did Not Attend  Description of Group:   What is a supportive framework? What does it look like feel like and how do I discern it from and unhealthy non-supportive network? Learn how to cope when supports are not helpful and don't support you. Discuss what to do when your family/friends are not supportive.  Summary of Patient Progress   Therapeutic Modalities:   Cognitive Behavioral Therapy Person-Centered Therapy Motivational Interviewing   Ida Rogue, [place] 03/02/2017 1:38 PM

## 2017-03-02 NOTE — Progress Notes (Addendum)
[facility] MD Progress Note  03/02/2017 10:25 AM Tony Cunningham  MRN:  [id number]   Subjective: Tony Cunningham reports " I am okay, but where is Doctor Cobos"  Objective: Tony Cunningham continues to present with apprehension and anxiety with his current discharge plan. Patient was reassured that he will be able to follow-up with ECT. Denies depression or depressive symptoms. Patient continues to isolate to is bedroom. Patient continues to need a lot of encouragement  for daily ADL's. Discussed group home to pick patient up on 03/03/2017 Support, encouragement and reassurance was provided.    Principal Problem: Schizoaffective disorder, depressive type (HCC) Diagnosis:   Patient Active Problem List   Diagnosis Date Noted  . 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 Schizoaffective disorder, depressive type (HCC) [F25.1] 09/23/2016  . Polysubstance abuse (HCC) [F19.10] 07/17/2016  . Tardive dyskinesia [G24.01] 07/16/2016  . Tobacco use disorder [F17.200] 07/07/2016  . Dyslipidemia [E78.5] 07/07/2016  . Asthma [J45.909] 07/07/2016  . HTN (hypertension) [I10] 07/06/2016  . Diabetes (HCC) [E11.9] 12/25/2010   Total Time spent with patient: 20 minutes  Past Psychiatric History: Patient with long standing hx of mental illness as well as multiple hospitalizations . Recently was at [facility] , prior to that at [facility]. Pt was receiving ECT treatments at [facility] , was supposed to return for maintenance treatment on the 28 th of this month. Patient has a history of cutting behaviors and suicidal attempts.  Past Medical History:  Past Medical History:  Diagnosis Date  . Anxiety   . Asthma   . Diabetes mellitus   . High blood pressure   . Sinus complaint    History reviewed. No pertinent surgical history.  Family History: History reviewed. No pertinent family history.  Family Psychiatric  History: Unknown  Social History:  History  Alcohol Use No     History  Drug Use No    Social History    Social History  . Marital status: Single    Spouse name: [name]  . Number of children: [name]  . Years of education: [name]   Social History Main Topics  . Smoking status: Current Every Day Smoker    Packs/day: 0.50    Types: Cigarettes  . Smokeless tobacco: Never Used  . Alcohol use No  . Drug use: No  . Sexual activity: Not Currently   Other Topics Concern  . None   Social History Narrative  . None   Additional Social History:    Pain Medications: See MAR Prescriptions: See MAR Over the Counter: See MAR History of alcohol / drug use?: No history of alcohol / drug abuse  Sleep: Fair  Appetite:  Fair  Current Medications: Current Facility-Administered Medications  Medication Dose Route Frequency Provider Last Rate Last Dose  . acetaminophen (TYLENOL) tablet 650 mg  650 mg Oral Q6H PRN Nira Conn A, NP   650 mg at 02/19/17 2129  . alum & mag hydroxide-simeth (MAALOX/MYLANTA) 200-200-20 MG/5ML suspension 30 mL  30 mL Oral Q4H PRN Nira Conn A, NP      . cloZAPine (CLOZARIL) tablet 150 mg  150 mg Oral QHS Nira Conn A, NP   150 mg at 03/01/17 2152  . cloZAPine (CLOZARIL) tablet 50 mg  50 mg Oral Daily Hisada, Barbee Cough, MD   50 mg at 03/02/17 0743  . FLUoxetine (PROZAC) capsule 40 mg  40 mg Oral Daily Nira Conn A, NP   40 mg at 03/02/17 0743  . insulin aspart (novoLOG) injection 0-15 Units  0-15 Units  Subcutaneous TID WC Jomarie Longs, MD   5 Units at 03/01/17 1814  . insulin aspart (novoLOG) injection 0-5 Units  0-5 Units Subcutaneous QHS Jomarie Longs, MD   4 Units at 02/27/17 2036  . insulin aspart (novoLOG) injection 5 Units  5 Units Subcutaneous TID WC Jerald Kief, MD   5 Units at 03/01/17 1812  . insulin glargine (LANTUS) injection 40 Units  40 Units Subcutaneous QHS Nira Conn A, NP   40 Units at 03/01/17 2157  . lisinopril (PRINIVIL,ZESTRIL) tablet 20 mg  20 mg Oral Daily Nira Conn A, NP   20 mg at 03/02/17 0743  . magnesium hydroxide (MILK OF MAGNESIA)  suspension 30 mL  30 mL Oral Daily PRN Nira Conn A, NP      . metFORMIN (GLUCOPHAGE) tablet 1,000 mg  1,000 mg Oral BID WC Nira Conn A, NP   1,000 mg at 03/02/17 0743  . montelukast (SINGULAIR) tablet 10 mg  10 mg Oral QHS Nira Conn A, NP   10 mg at 03/01/17 2153  . OLANZapine zydis (ZYPREXA) disintegrating tablet 5 mg  5 mg Oral TID PRN Jomarie Longs, MD   5 mg at 02/19/17 2133   Or  . OLANZapine (ZYPREXA) injection 5 mg  5 mg Intramuscular TID PRN Eappen, Saramma, MD      . pantoprazole (PROTONIX) EC tablet 40 mg  40 mg Oral Daily Nira Conn A, NP   40 mg at 03/02/17 0742  . propranolol (INDERAL) tablet 20 mg  20 mg Oral BID Nira Conn A, NP   20 mg at 03/02/17 0742  . traZODone (DESYREL) tablet 50 mg  50 mg Oral QHS,MR X 1 Nira Conn A, NP   50 mg at 03/01/17 2219   Lab Results:  Results for orders placed or performed during the hospital encounter of 02/14/17 (from the past 48 hour(s))  Glucose, capillary     Status: None   Collection Time: 02/28/17 12:01 PM  Result Value Ref Range   Glucose-Capillary 98 65 - 99 mg/dL  Glucose, capillary     Status: Abnormal   Collection Time: 02/28/17  4:47 PM  Result Value Ref Range   Glucose-Capillary 167 (H) 65 - 99 mg/dL  Glucose, capillary     Status: Abnormal   Collection Time: 02/28/17  7:40 PM  Result Value Ref Range   Glucose-Capillary 195 (H) 65 - 99 mg/dL  Glucose, capillary     Status: Abnormal   Collection Time: 03/01/17  5:44 AM  Result Value Ref Range   Glucose-Capillary 169 (H) 65 - 99 mg/dL   Comment 1 Notify RN    Comment 2 Document in Chart   Glucose, capillary     Status: None   Collection Time: 03/01/17 12:05 PM  Result Value Ref Range   Glucose-Capillary 91 65 - 99 mg/dL   Comment 1 Notify RN    Comment 2 Document in Chart   Glucose, capillary     Status: Abnormal   Collection Time: 03/01/17  5:02 PM  Result Value Ref Range   Glucose-Capillary 218 (H) 65 - 99 mg/dL   Comment 1 Notify RN    Comment 2  Document in Chart   Glucose, capillary     Status: Abnormal   Collection Time: 03/01/17  8:42 PM  Result Value Ref Range   Glucose-Capillary 189 (H) 65 - 99 mg/dL  Glucose, capillary     Status: None   Collection Time: 03/02/17  6:13 AM  Result  Value Ref Range   Glucose-Capillary 68 65 - 99 mg/dL   Blood Alcohol level:  Lab Results  Component Value Date   ETH <5 01/16/2017   ETH <5 12/21/2016   Metabolic Disorder Labs: Lab Results  Component Value Date   HGBA1C 7.6 (H) 01/19/2017   MPG 171.42 01/19/2017   MPG 249 11/09/2016   Lab Results  Component Value Date   PROLACTIN 24.5 (H) 09/24/2016   PROLACTIN 3.4 (L) 07/07/2016   Lab Results  Component Value Date   CHOL 122 01/19/2017   TRIG 154 (H) 01/19/2017   HDL 36 (L) 01/19/2017   CHOLHDL 3.4 01/19/2017   VLDL 31 01/19/2017   LDLCALC 55 01/19/2017   LDLCALC 59 12/28/2016   Physical Findings: AIMS: Facial and Oral Movements Muscles of Facial Expression: None, normal Lips and Perioral Area: None, normal Jaw: None, normal Tongue: None, normal,Extremity Movements Upper (arms, wrists, hands, fingers): None, normal Lower (legs, knees, ankles, toes): None, normal, Trunk Movements Neck, shoulders, hips: None, normal, Overall Severity Severity of abnormal movements (highest score from questions above): None, normal Incapacitation due to abnormal movements: None, normal Patient's awareness of abnormal movements (rate only patient's report): No Awareness, Dental Status Current problems with teeth and/or dentures?: No Does patient usually wear dentures?: No  CIWA:  CIWA-Ar Total: 1 COWS:  COWS Total Score: 1  Musculoskeletal: Strength & Muscle Tone: within normal limits Gait & Station: normal Patient leans: [name]  Psychiatric Specialty Exam: Physical Exam  Nursing note and vitals reviewed. Constitutional: He is oriented to person, place, and time. He appears well-developed.  Cardiovascular: Normal rate.    Neurological: He is oriented to person, place, and time.  Psychiatric: He has a normal mood and affect.    Review of Systems  Unable to perform ROS: Psychiatric disorder  Psychiatric/Behavioral: Positive for depression, hallucinations and suicidal ideas. The patient is nervous/anxious.     Blood pressure 116/77, pulse 86, temperature 98.6 F (37 C), temperature source Oral, resp. rate 16, height 6' (1.829 m), weight 76.7 kg (169 lb).Body mass index is 22.92 kg/m.  General Appearance: Guarded paper scrubs and  malodorous  Eye Contact:  Fair  Speech:  Slow  Volume:  Decreased,  Mood:  Anxious  Affect:  Blunt, Depressed and Flat   Thought Process:  Goal Directed and Descriptions of Associations: Circumstantial   Orientation:  Other:  self, situation  Thought Content:  Paranoid Ideation and Rumination   Suicidal Thoughts:  No  Homicidal Thoughts:  No  Memory:  Immediate;   Fair Recent;   Fair Remote;   Poor  Judgement:  Impaired  Insight:  Lacking  Psychomotor Activity:  Decreased- mild improvement   Concentration:  Concentration: Fair and Attention Span: Fair  Recall:  [employer] of Knowledge:  Fair  Language:  Fair  Akathisia:  No  Handed:  Right  AIMS (if indicated):     Assets:  Desire for Improvement Financial Resources/Insurance Housing Physical Health Social Support  ADL's:  Intact  Cognition:  WNL  Sleep:  Number of Hours: 4     I agree with current treatment plan on 03/02/2017, Patient seen face-to-face for psychiatric evaluation follow-up, chart reviewed. Reviewed the information documented and agree with the treatment plan.  Will continue treatment. Daily contact with patient to assess and evaluate symptoms and progress in treatment.  Will continue today 03/02/17 plan as below except where it is noted.  - Continue clozapine 150 mg po  qhs, 50 mg po  daily (Confirmed patient registration at [facility]) - EKG reviewed; NSR, QTc 413 msec with is within normal  range.  - Discussed with nurse to monitor bowel movement.  - Continue Fluoxetine 40 mg po daily for depression - Continue propranolol 20 mg po BID for anxiety/fine tremors. - Continue Trazodone 50 mg po q hs for insomnia. -Order diabetic consult . -CSW will continue to work on disposition. Schedule to discharge on 03/03/2017   Oneta Rack, NP, 03/02/2017, 10:25 AM\

## 2017-03-02 NOTE — Tx Team (Signed)
Interdisciplinary Treatment and Diagnostic Plan Update  03/02/2017 Time of Session: 1:39 PM  Clois Treanor MRN: [id number]  Principal Diagnosis: Schizoaffective disorder, depressive type [facility])  Secondary Diagnoses: Principal Problem:   Schizoaffective disorder, depressive type (HCC) Active Problems:   Diabetes (HCC)   Neuroleptic-induced Parkinsonism (HCC)   Current Medications:  Current Facility-Administered Medications  Medication Dose Route Frequency Provider Last Rate Last Dose  . acetaminophen (TYLENOL) tablet 650 mg  650 mg Oral Q6H PRN Nira Conn A, NP   650 mg at 02/19/17 2129  . alum & mag hydroxide-simeth (MAALOX/MYLANTA) 200-200-20 MG/5ML suspension 30 mL  30 mL Oral Q4H PRN Nira Conn A, NP      . cloZAPine (CLOZARIL) tablet 150 mg  150 mg Oral QHS Nira Conn A, NP   150 mg at 03/01/17 2152  . cloZAPine (CLOZARIL) tablet 50 mg  50 mg Oral Daily Hisada, Barbee Cough, MD   50 mg at 03/02/17 0743  . FLUoxetine (PROZAC) capsule 40 mg  40 mg Oral Daily Nira Conn A, NP   40 mg at 03/02/17 0743  . insulin aspart (novoLOG) injection 0-15 Units  0-15 Units Subcutaneous TID WC Jomarie Longs, MD   5 Units at 03/01/17 1814  . insulin aspart (novoLOG) injection 0-5 Units  0-5 Units Subcutaneous QHS Jomarie Longs, MD   4 Units at 02/27/17 2036  . insulin aspart (novoLOG) injection 5 Units  5 Units Subcutaneous TID WC Jerald Kief, MD   5 Units at 03/01/17 1812  . insulin glargine (LANTUS) injection 40 Units  40 Units Subcutaneous QHS Nira Conn A, NP   40 Units at 03/01/17 2157  . lisinopril (PRINIVIL,ZESTRIL) tablet 20 mg  20 mg Oral Daily Nira Conn A, NP   20 mg at 03/02/17 0743  . magnesium hydroxide (MILK OF MAGNESIA) suspension 30 mL  30 mL Oral Daily PRN Nira Conn A, NP      . metFORMIN (GLUCOPHAGE) tablet 1,000 mg  1,000 mg Oral BID WC Nira Conn A, NP   1,000 mg at 03/02/17 0743  . montelukast (SINGULAIR) tablet 10 mg  10 mg Oral QHS Nira Conn A, NP   10 mg at  03/01/17 2153  . OLANZapine zydis (ZYPREXA) disintegrating tablet 5 mg  5 mg Oral TID PRN Jomarie Longs, MD   5 mg at 02/19/17 2133   Or  . OLANZapine (ZYPREXA) injection 5 mg  5 mg Intramuscular TID PRN Eappen, Saramma, MD      . pantoprazole (PROTONIX) EC tablet 40 mg  40 mg Oral Daily Nira Conn A, NP   40 mg at 03/02/17 0742  . propranolol (INDERAL) tablet 20 mg  20 mg Oral BID Nira Conn A, NP   20 mg at 03/02/17 0742  . traZODone (DESYREL) tablet 50 mg  50 mg Oral QHS,MR X 1 Nira Conn A, NP   50 mg at 03/01/17 2219    PTA Medications: Prescriptions Prior to Admission  Medication Sig Dispense Refill Last Dose  . amantadine (SYMMETREL) 100 MG capsule Take 100 mg by mouth 2 (two) times daily.   02/11/2017  . citalopram (CELEXA) 20 MG tablet Take 20 mg by mouth daily.   02/11/2017  . cloZAPine (CLOZARIL) 50 MG tablet Take 3 tablets (150 mg total) by mouth at bedtime. 90 tablet 1 02/11/2017  . FLUoxetine (PROZAC) 40 MG capsule Take 1 capsule (40 mg total) by mouth daily. 30 capsule 1 02/11/2017  . insulin aspart (NOVOLOG) 100 UNIT/ML injection Inject 5 Units into  the skin 3 (three) times daily with meals. (Patient taking differently: Inject 6 Units into the skin 3 (three) times daily with meals. ) 10 mL 2 02/11/2017  . insulin glargine (LANTUS) 100 UNIT/ML injection Inject 0.4 mLs (40 Units total) into the skin at bedtime. (Patient taking differently: Inject 45 Units into the skin at bedtime. ) 10 mL 2 02/11/2017  . lisinopril (PRINIVIL,ZESTRIL) 20 MG tablet Take 1 tablet (20 mg total) by mouth daily. 30 tablet 1 02/11/2017  . metFORMIN (GLUCOPHAGE) 1000 MG tablet Take 1 tablet (1,000 mg total) by mouth 2 (two) times daily with a meal. 60 tablet 1 02/11/2017  . pantoprazole (PROTONIX) 40 MG tablet Take 1 tablet (40 mg total) by mouth daily. 30 tablet 1 02/11/2017  . propranolol (INDERAL) 20 MG tablet Take 1 tablet (20 mg total) by mouth 2 (two) times daily. 60 tablet 1 02/11/2017  . simvastatin  (ZOCOR) 20 MG tablet Take 20 mg by mouth daily.   02/11/2017  . temazepam (RESTORIL) 7.5 MG capsule Take 7.5 mg by mouth at bedtime.   02/11/2017  . montelukast (SINGULAIR) 10 MG tablet Take 1 tablet (10 mg total) by mouth at bedtime. (Patient not taking: Reported on 02/15/2017) 30 tablet 1 Not Taking at Unknown time    Patient Stressors: Marital or family conflict Other: blood sugar  Patient Strengths: General fund of knowledge Motivation for treatment/growth  Treatment Modalities: Medication Management, Group therapy, Case management,  1 to 1 session with clinician, Psychoeducation, Recreational therapy.   Physician Treatment Plan for Primary Diagnosis: Schizoaffective disorder, depressive type (HCC) Long Term Goal(s): Improvement in symptoms so as ready for discharge  Short Term Goals: Ability to verbalize feelings will improve Ability to demonstrate self-control will improve Compliance with prescribed medications will improve Ability to verbalize feelings will improve Ability to identify and develop effective coping behaviors will improve Compliance with prescribed medications will improve  Medication Management: Evaluate patient's response, side effects, and tolerance of medication regimen.  Therapeutic Interventions: 1 to 1 sessions, Unit Group sessions and Medication administration.  Evaluation of Outcomes: Adequate for Discharge   9/27: Patient with schizoaffective do , continues to have thought blocking , negative sx as well as HI. Pt continues to need a lot of support to take care of his ADLs as well as his medications.   Pt will benefit from being in a supervised, structured place - since he has been in and out of long hospital stays since May 2018 .  He has failed multiple trials of medications as well as recent ECT treatments( this month).   Increase Clozaril to 25 mg po daily and 150 mg po qhs for psychosis. CBC with diff as per Clozaril therapy needs -  Pharmacy consult placed- Results for DAVONTRE, ACKLAND (MRN [id number]) as of 02/17/2017 11:46  Prozac 40 mg po daily for affective sx. Propranolol 20 mg po bid for anxiety, tachycardia. Trazodone 50 mg po qhs prn for insomnia. Pharmacy consult for clozaril therapy. Repeat BMP for hx of hyponatremia - WNL. Consulted Dr.Clapac regarding ECT treatments - per Dr.Clapac - pt  will not be a good candidate at this time. Will refer to [facility] for further management. Will give PPD today 02/17/2017 for possible [facility] referral.  10/2: Continue clozapine 150 mg qhs, 50 mg daily (Confirmed patient registration at [facility]) - EKG reviewed; NSR, QTc 413 msec.  - Discussed with nurse to monitor bowel movement.  - Continue fluoxetine 40 mg daily for depression - Continue propranolol 20 mg  BID for anxiety/fine tremors. - Continue Trazodone 50 mg q hs for insomnia.   Physician Treatment Plan for Secondary Diagnosis: Principal Problem:   Schizoaffective disorder, depressive type (HCC) Active Problems:   Diabetes (HCC)   Neuroleptic-induced Parkinsonism (HCC)   Long Term Goal(s): Improvement in symptoms so as ready for discharge  Short Term Goals: Ability to verbalize feelings will improve Ability to demonstrate self-control will improve Compliance with prescribed medications will improve Ability to verbalize feelings will improve Ability to identify and develop effective coping behaviors will improve Compliance with prescribed medications will improve  Medication Management: Evaluate patient's response, side effects, and tolerance of medication regimen.  Therapeutic Interventions: 1 to 1 sessions, Unit Group sessions and Medication administration.  Evaluation of Outcomes: Adequate for Discharge   RN Treatment Plan for Primary Diagnosis: Schizoaffective disorder, depressive type (HCC) Long Term Goal(s): Knowledge of disease and therapeutic regimen to maintain health will improve  Short Term Goals: Ability to  disclose and discuss suicidal ideas and Ability to identify and develop effective coping behaviors will improve  Medication Management: RN will administer medications as ordered by provider, will assess and evaluate patient's response and provide education to patient for prescribed medication. RN will report any adverse and/or side effects to prescribing provider.  Therapeutic Interventions: 1 on 1 counseling sessions, Psychoeducation, Medication administration, Evaluate responses to treatment, Monitor vital signs and CBGs as ordered, Perform/monitor CIWA, COWS, AIMS and Fall Risk screenings as ordered, Perform wound care treatments as ordered.  Evaluation of Outcomes: Adequate for Discharge   LCSW Treatment Plan for Primary Diagnosis: Schizoaffective disorder, depressive type (HCC) Long Term Goal(s): Safe transition to appropriate next level of care at discharge, Engage patient in therapeutic group addressing interpersonal concerns.  Short Term Goals: Engage patient in aftercare planning with referrals and resources  Therapeutic Interventions: Assess for all discharge needs, 1 to 1 time with Social worker, Explore available resources and support systems, Assess for adequacy in community support network, Educate family and significant other(s) on suicide prevention, Complete Psychosocial Assessment, Interpersonal group therapy.  Evaluation of Outcomes: Progressing,  Referred pt to both group home and [facility]. Additional labs-will send when completed.  Pt and I are to call mother on weekend when mother is available with help of interpreter.   10/2:  Pt on [facility] wait list.  Visit tomorrow with [facility] representative for possible placement.  Have been unable to reach pt's mother via phone with pt.  Pt told me yesterday that she is not available until after 5PM.  Possible treatment team meeting coordinated by [name] on Thursday at 11:00  10/5:  Remains on [facility] wait list.  Accepted at [facility] in  [place].  CSW to update care coordinator and mother re continued discharge planning to community.    10/8:  Pt to [facility] tomorrow-Ms [phone]  Progress in Treatment: Attending groups:Intermittently Participating in groups: Minimally Taking medication as prescribed: Yes Toleration medication: Yes, no side effects reported at this time Family/Significant other contact made: No Patient understands diagnosis: No Limited insight Discussing patient identified problems/goals with staff: Yes Medical problems stabilized or resolved: Yes Denies suicidal/homicidal ideation: Yes Issues/concerns per patient self-inventory: None Other: [name]  New problem(s) identified: None identified at this time.   New Short Term/Long Term Goal(s): Chose to not participate in treatment team process. 10/5:  Patient referred for group home placement, [facility] accepted patient.  CSW to contact mother to discuss visitation options.  Will arrange aftercare in area of group home,  CSW to discuss options for transportation  Discharge Plan or Barriers: Patient remaining agreeable to group home placement, weekend CSW to discuss options for visitation and contact w mother and update [facility] care coordinator.    Reason for Continuation of Hospitalization:  Medication stabilization  Estimated Length of Stay: Likely d/c tomorrow  Attendees: Patient:  03/02/2017  1:39 PM  Physician: Nehemiah Massed, MD 03/02/2017  1:39 PM  Nursing: Georgiana Shore, RN 03/02/2017  1:39 PM  RN Care Manager:  03/02/2017  1:39 PM  Social Worker: Richelle Ito LCSW 03/02/2017  1:39 PM  Recreational Therapist:  03/02/2017  1:39 PM  Other:  03/02/2017  1:39 PM  Other:  03/02/2017  1:39 PM    Scribe for Treatment Team:  Richelle Ito LCSW 03/02/2017 1:39 PM

## 2017-03-02 NOTE — Progress Notes (Signed)
Patient denies SI, HI and AVH.  Patient has been attending groups and engaged in unit activities.  Patient had no incidents of behavioral dyscontrol.   Assess patient for safety, offer medications as prescribed, and engage patient in 1:1 staff talks, encourage patient to use effective coping skills.   Patient able to contract for safety, Continue to monitor for safety.  

## 2017-03-02 NOTE — Progress Notes (Signed)
Patient ID: Tony Cunningham, male   DOB: 1975-12-21, 41 y.o.   MRN: [id number] PER STATE REGULATIONS 482.30  THIS CHART WAS REVIEWED FOR MEDICAL NECESSITY WITH RESPECT TO THE PATIENT'S ADMISSION/ DURATION OF STAY.  NEXT REVIEW DATE: 03/06/2017 Willa Rough, RN, BSN CASE MANAGER

## 2017-03-02 NOTE — Progress Notes (Signed)
DATA ACTION RESPONSE  Objective- Pt. is visible in the dayroom, seen eating a snack. Presents with an animated/anxious affect and mood. Brightens mildly with interaction. Pt had a good day after mom came to visit. Pt states he feel anxious about possible d/c on Thursday to a new group home.   Subjective- Denies having any SI/HI/AVH/Pain at this time. Is cooperative and remain safe on the unit.  1:1 interaction in private to establish rapport. Encouragement, education, & support given from staff. BS was 259 at bedtime.   Safety maintained with Q 15 checks. Continue with POC.

## 2017-03-02 NOTE — Progress Notes (Signed)
Recreation Therapy Notes  Date: 03/02/17 Time: 1000 Location: 500 Dayroom  Group Topic: Wellness  Goal Area(s) Addresses:  Patient will define components of whole wellness. Patient will verbalize benefit of whole wellness.  Intervention:  2 Decks of Cards   Activity: Deck of Chance.  LRT had 2 decks of cards.  Each patient was given 2 cards from one deck of playing cards.  LRT would pull a card from a separate deck of cards.  If a patients number was pulled, they had to perform the exercise that goes with that card.  Education: Wellness, Building control surveyor.   Education Outcome: Acknowledges education/In group clarification offered/Needs additional education.   Clinical Observations/Feedback:  Pt did not attend group.    [name], LRT/CTRS         [name] A 03/02/2017 12:36 PM

## 2017-03-02 NOTE — Progress Notes (Signed)
Inpatient Diabetes Program Recommendations  AACE/ADA: New Consensus Statement on Inpatient Glycemic Control (2015)  Target Ranges:  Prepandial:   less than 140 mg/dL      Peak postprandial:   less than 180 mg/dL (1-2 hours)      Critically ill patients:  140 - 180 mg/dL   Lab Results  Component Value Date   GLUCAP 57 (L) 03/02/2017   HGBA1C 7.6 (H) 01/19/2017    Review of Glycemic Control  Hypoglycemia this am. Insulin adjustment with basal insulin.  Inpatient Diabetes Program Recommendations:   Decrease Lantus to 36 units QHS  Thank you. Ailene Ards, RD, LDN, CDE Inpatient Diabetes Coordinator [phone]

## 2017-03-02 NOTE — Progress Notes (Signed)
Adult Psychoeducational Group Note  Date:  03/02/2017 Time:  8:42 PM  Group Topic/Focus:  Wrap-Up Group:   The focus of this group is to help patients review their daily goal of treatment and discuss progress on daily workbooks.  Participation Level:  Active  Participation Quality:  Appropriate  Affect:  Appropriate  Cognitive:  Appropriate  Insight: Appropriate  Engagement in Group:  Engaged  Modes of Intervention:  Discussion  Additional Comments:  The patient expressed that he attend groups and rates today a 10.The patient also said that he was ready for discharge.  Octavio Manns 03/02/2017, 8:42 PM

## 2017-03-02 NOTE — [facility] Suicide Risk Assessment (Signed)
[facility] INPATIENT:  Family/Significant Other Suicide Prevention Education  Suicide Prevention Education:  Patient Discharged to Other Healthcare Facility:  Suicide Prevention Education Not Provided: Ms Allyne Gee [facility] in [place], [phone] The patient is discharging to another healthcare facility for continuation of treatment.  The patient's medical information, including suicide ideations and risk factors, are a part of the medical information shared with the receiving healthcare facility.  Tony Cunningham 03/02/2017, 1:46 PM

## 2017-03-03 LAB — GLUCOSE, CAPILLARY
GLUCOSE-CAPILLARY: 58 mg/dL — AB (ref 65–99)
GLUCOSE-CAPILLARY: 99 mg/dL (ref 65–99)
GLUCOSE-CAPILLARY: 198 mg/dL — AB (ref 65–99)
Glucose-Capillary: 89 mg/dL (ref 65–99)
Glucose-Capillary: 329 mg/dL — ABNORMAL HIGH (ref 65–99)

## 2017-03-03 NOTE — Progress Notes (Signed)
Adult Psychoeducational Group Note  Date:  03/03/2017 Time:  8:46 PM  Group Topic/Focus:  Wrap-Up Group:   The focus of this group is to help patients review their daily goal of treatment and discuss progress on daily workbooks.  Participation Level:  Active  Participation Quality:  Appropriate  Affect:  Appropriate  Cognitive:  Appropriate  Insight: Appropriate  Engagement in Group:  Engaged  Modes of Intervention:  Discussion  Additional Comments:  The patient expressed that he attended all groups.The patient also said that rates today a 7.  Octavio Manns 03/03/2017, 8:46 PM

## 2017-03-03 NOTE — [facility] Group Notes (Signed)
LCSW Group Therapy Note  03/03/2017 1:15pm  Type of Therapy/Topic:  Group Therapy:  Balance in Life  Participation Level:  Minimal  Description of Group:    This group will address the concept of balance and how it feels and looks when one is unbalanced. Patients will be encouraged to process areas in their lives that are out of balance and identify reasons for remaining unbalanced. Facilitators will guide patients in utilizing problem-solving interventions to address and correct the stressor making their life unbalanced. Understanding and applying boundaries will be explored and addressed for obtaining and maintaining a balanced life. Patients will be encouraged to explore ways to assertively make their unbalanced needs known to significant others in their lives, using other group members and facilitator for support and feedback.  Therapeutic Goals: 1. Patient will identify two or more emotions or situations they have that consume much of in their lives. 2. Patient will identify signs/triggers that life has become out of balance:  3. Patient will identify two ways to set boundaries in order to achieve balance in their lives:  4. Patient will demonstrate ability to communicate their needs through discussion and/or role plays  Summary of Patient Progress:   Tony Cunningham came to group late.  He participated in the group deep breathing exercise.  He was an active participant while he was in group.  He shared that being outside and using breathing techniques have been helpful for him in keeping balance in his life.    Therapeutic Modalities:   Cognitive Behavioral Therapy Solution-Focused Therapy Assertiveness Training  [name], Student-Social Work 03/03/2017 2:35 PM

## 2017-03-03 NOTE — Progress Notes (Signed)
Recreation Therapy Notes  Date: 03/03/17 Time: 1000 Location: 500 Hall Dayroom  Group Topic: Self-Esteem  Goal Area(s) Addresses:  Patient will successfully identify positive attributes about themselves.  Patient will successfully identify benefit of improved self-esteem.  Intervention: Pencils, paper  Activity: Creative Writing.  Patients were divided into two groups of 4.  Each person was to identify the positive traits they posses.  The group was to then create Cunningham story based on Cunningham superhero the created using the qualities they identified in themselves.  They were to include in the story how the superhero would use their positive traits to help others overcome their struggles such as depression, anxiety, addiction, etc.  Education:  Self-Esteem, Discharge Planning.   Education Outcome: Acknowledges education/In group clarification offered/Needs additional education  Clinical Observations/Feedback: Pt did not attend group.   Marshall Kampf, LRT/CTRS         Tony Cunningham 03/03/2017 12:55 PM 

## 2017-03-03 NOTE — Progress Notes (Signed)
   03/03/17 0618  Provider Notification  Reason for Communication Critical lab value (low BS )  Provider Name Donell Sievert, PA   Provider Role Physician assistant  Method of Communication Face to face  Response No new orders  Notification Time 0618  Shift Event Other (low BS )   Will follow hypoglycemic protocol.

## 2017-03-03 NOTE — Progress Notes (Signed)
[facility] MD Progress Note  03/03/2017 3:05 PM Zebulun Deman  MRN:  [id number]   Subjective: Nikki states " I am okay"   Objective: Sung Amabile seen sitting in dayroom interacting with peers and staff. Patient is participating  with group session on today. Medication complaint and tolerating medication well. Per staff patient has to be reminded about excessive sugar intake due to is diabetes. Support, encouragement and reassurance was provided.   Principal Problem: Schizoaffective disorder, depressive type ([facility]) Diagnosis:   Patient Active Problem List   Diagnosis Date Noted  . 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 Schizoaffective disorder, depressive type ([place]) [F25.1] 09/23/2016  . Polysubstance abuse ([place]) [F19.10] 07/17/2016  . Tardive dyskinesia [G24.01] 07/16/2016  . Tobacco use disorder [F17.200] 07/07/2016  . Dyslipidemia [E78.5] 07/07/2016  . Asthma [J45.909] 07/07/2016  . HTN (hypertension) [I10] 07/06/2016  . Diabetes ([place]) [E11.9] 12/25/2010   Total Time spent with patient: 20 minutes  Past Psychiatric History: Patient with long standing hx of mental illness as well as multiple hospitalizations . Recently was at [facility] , prior to that at [facility]. Pt was receiving ECT treatments at [facility] , was supposed to return for maintenance treatment on the 28 th of this month. Patient has a history of cutting behaviors and suicidal attempts.  Past Medical History:  Past Medical History:  Diagnosis Date  . Anxiety   . Asthma   . Diabetes mellitus   . High blood pressure   . Sinus complaint    History reviewed. No pertinent surgical history.  Family History: History reviewed. No pertinent family history.  Family Psychiatric  History: Unknown  Social History:  History  Alcohol Use No     History  Drug Use No    Social History   Social History  . Marital status: Single    Spouse name: [name]  . Number of children: [name]  . Years of education: [name]   Social  History Main Topics  . Smoking status: Current Every Day Smoker    Packs/day: 0.50    Types: Cigarettes  . Smokeless tobacco: Never Used  . Alcohol use No  . Drug use: No  . Sexual activity: Not Currently   Other Topics Concern  . None   Social History Narrative  . None   Additional Social History:    Pain Medications: See MAR Prescriptions: See MAR Over the Counter: See MAR History of alcohol / drug use?: No history of alcohol / drug abuse  Sleep: Fair  Appetite:  Fair  Current Medications: Current Facility-Administered Medications  Medication Dose Route Frequency Provider Last Rate Last Dose  . acetaminophen (TYLENOL) tablet 650 mg  650 mg Oral Q6H PRN Lindon Romp A, NP   650 mg at 02/19/17 2129  . alum & mag hydroxide-simeth (MAALOX/MYLANTA) 200-200-20 MG/5ML suspension 30 mL  30 mL Oral Q4H PRN Lindon Romp A, NP      . cloZAPine (CLOZARIL) tablet 150 mg  150 mg Oral QHS Lindon Romp A, NP   150 mg at 03/02/17 2113  . cloZAPine (CLOZARIL) tablet 50 mg  50 mg Oral Daily Hisada, Elie Goody, MD   50 mg at 03/03/17 0853  . FLUoxetine (PROZAC) capsule 40 mg  40 mg Oral Daily Lindon Romp A, NP   40 mg at 03/03/17 0853  . insulin aspart (novoLOG) injection 0-15 Units  0-15 Units Subcutaneous TID WC Ursula Alert, MD   15 Units at 03/02/17 1826  . insulin aspart (novoLOG) injection 0-5 Units  0-5  Units Subcutaneous QHS Ursula Alert, MD   3 Units at 03/02/17 2112  . insulin aspart (novoLOG) injection 5 Units  5 Units Subcutaneous TID WC Donne Hazel, MD   5 Units at 03/02/17 1828  . insulin glargine (LANTUS) injection 40 Units  40 Units Subcutaneous QHS Lindon Romp A, NP   40 Units at 03/02/17 2113  . lisinopril (PRINIVIL,ZESTRIL) tablet 20 mg  20 mg Oral Daily Lindon Romp A, NP   20 mg at 03/03/17 0854  . magnesium hydroxide (MILK OF MAGNESIA) suspension 30 mL  30 mL Oral Daily PRN Lindon Romp A, NP      . metFORMIN (GLUCOPHAGE) tablet 1,000 mg  1,000 mg Oral BID WC Lindon Romp A, NP   1,000 mg at 03/03/17 0854  . montelukast (SINGULAIR) tablet 10 mg  10 mg Oral QHS Lindon Romp A, NP   10 mg at 03/02/17 2113  . OLANZapine zydis (ZYPREXA) disintegrating tablet 5 mg  5 mg Oral TID PRN Ursula Alert, MD   5 mg at 02/19/17 2133   Or  . OLANZapine (ZYPREXA) injection 5 mg  5 mg Intramuscular TID PRN Eappen, Saramma, MD      . pantoprazole (PROTONIX) EC tablet 40 mg  40 mg Oral Daily Lindon Romp A, NP   40 mg at 03/03/17 0854  . propranolol (INDERAL) tablet 20 mg  20 mg Oral BID Lindon Romp A, NP   20 mg at 03/03/17 0854  . traZODone (DESYREL) tablet 50 mg  50 mg Oral QHS,MR X 1 Lindon Romp A, NP   50 mg at 03/02/17 2211   Lab Results:  Results for orders placed or performed during the hospital encounter of 02/14/17 (from the past 48 hour(s))  Glucose, capillary     Status: Abnormal   Collection Time: 03/01/17  5:02 PM  Result Value Ref Range   Glucose-Capillary 218 (H) 65 - 99 mg/dL   Comment 1 Notify RN    Comment 2 Document in Chart   Glucose, capillary     Status: Abnormal   Collection Time: 03/01/17  8:42 PM  Result Value Ref Range   Glucose-Capillary 189 (H) 65 - 99 mg/dL  Glucose, capillary     Status: None   Collection Time: 03/02/17  6:13 AM  Result Value Ref Range   Glucose-Capillary 68 65 - 99 mg/dL  Glucose, capillary     Status: Abnormal   Collection Time: 03/02/17 12:03 PM  Result Value Ref Range   Glucose-Capillary 57 (L) 65 - 99 mg/dL  Glucose, capillary     Status: Abnormal   Collection Time: 03/02/17  2:25 PM  Result Value Ref Range   Glucose-Capillary 357 (H) 65 - 99 mg/dL  Glucose, capillary     Status: Abnormal   Collection Time: 03/02/17  5:01 PM  Result Value Ref Range   Glucose-Capillary 400 (H) 65 - 99 mg/dL  CBC with Differential/Platelet     Status: None   Collection Time: 03/02/17  6:30 PM  Result Value Ref Range   WBC 7.8 4.0 - 10.5 K/uL   RBC 5.00 4.22 - 5.81 MIL/uL   Hemoglobin 15.1 13.0 - 17.0 g/dL   HCT 43.4  39.0 - 52.0 %   MCV 86.8 78.0 - 100.0 fL   MCH 30.2 26.0 - 34.0 pg   MCHC 34.8 30.0 - 36.0 g/dL   RDW 12.4 11.5 - 15.5 %   Platelets 210 150 - 400 K/uL   Neutrophils Relative % 58 %  Neutro Abs 4.6 1.7 - 7.7 K/uL   Lymphocytes Relative 31 %   Lymphs Abs 2.4 0.7 - 4.0 K/uL   Monocytes Relative 7 %   Monocytes Absolute 0.5 0.1 - 1.0 K/uL   Eosinophils Relative 3 %   Eosinophils Absolute 0.3 0.0 - 0.7 K/uL   Basophils Relative 1 %   Basophils Absolute 0.0 0.0 - 0.1 K/uL    Comment: Performed at [facility], [place] [address], [place], [place] [address]  Comprehensive metabolic panel     Status: Abnormal   Collection Time: 03/02/17  6:30 PM  Result Value Ref Range   Sodium 132 (L) 135 - 145 mmol/L   Potassium 4.3 3.5 - 5.1 mmol/L   Chloride 93 (L) 101 - 111 mmol/L   CO2 27 22 - 32 mmol/L   Glucose, Bld 410 (H) 65 - 99 mg/dL   BUN 13 6 - 20 mg/dL   Creatinine, Ser 0.87 0.61 - 1.24 mg/dL   Calcium 8.9 8.9 - 10.3 mg/dL   Total Protein 6.8 6.5 - 8.1 g/dL   Albumin 4.0 3.5 - 5.0 g/dL   AST 20 15 - 41 U/L   ALT 20 17 - 63 U/L   Alkaline Phosphatase 77 38 - 126 U/L   Total Bilirubin 0.4 0.3 - 1.2 mg/dL   GFR calc non Af Amer >60 >60 mL/min   GFR calc Af Amer >60 >60 mL/min    Comment: (NOTE) The eGFR has been calculated using the CKD EPI equation. This calculation has not been validated in all clinical situations. eGFR's persistently <60 mL/min signify possible Chronic Kidney Disease.    Anion gap 12 5 - 15    Comment: Performed at [facility], [place] [address], [place], [place] [address]  Glucose, capillary     Status: Abnormal   Collection Time: 03/02/17  8:28 PM  Result Value Ref Range   Glucose-Capillary 259 (H) 65 - 99 mg/dL  Glucose, capillary     Status: Abnormal   Collection Time: 03/03/17  6:11 AM  Result Value Ref Range   Glucose-Capillary 58 (L) 65 - 99 mg/dL  Glucose, capillary     Status: None   Collection Time: 03/03/17  6:29  AM  Result Value Ref Range   Glucose-Capillary 89 65 - 99 mg/dL  Glucose, capillary     Status: None   Collection Time: 03/03/17 12:10 PM  Result Value Ref Range   Glucose-Capillary 99 65 - 99 mg/dL   Blood Alcohol level:  Lab Results  Component Value Date   ETH <5 01/16/2017   ETH <5 84/16/6063   Metabolic Disorder Labs: Lab Results  Component Value Date   HGBA1C 7.6 (H) 01/19/2017   MPG 171.42 01/19/2017   MPG 249 11/09/2016   Lab Results  Component Value Date   PROLACTIN 24.5 (H) 09/24/2016   PROLACTIN 3.4 (L) 07/07/2016   Lab Results  Component Value Date   CHOL 122 01/19/2017   TRIG 154 (H) 01/19/2017   HDL 36 (L) 01/19/2017   CHOLHDL 3.4 01/19/2017   VLDL 31 01/19/2017   LDLCALC 55 01/19/2017   LDLCALC 59 12/28/2016   Physical Findings: AIMS: Facial and Oral Movements Muscles of Facial Expression: None, normal Lips and Perioral Area: None, normal Jaw: None, normal Tongue: None, normal,Extremity Movements Upper (arms, wrists, hands, fingers): None, normal Lower (legs, knees, ankles, toes): None, normal, Trunk Movements Neck, shoulders, hips: None, normal, Overall Severity Severity of abnormal movements (highest score from  questions above): None, normal Incapacitation due to abnormal movements: None, normal Patient's awareness of abnormal movements (rate only patient's report): No Awareness, Dental Status Current problems with teeth and/or dentures?: No Does patient usually wear dentures?: No  CIWA:  CIWA-Ar Total: 1 COWS:  COWS Total Score: 1  Musculoskeletal: Strength & Muscle Tone: within normal limits Gait & Station: normal Patient leans: [name]  Psychiatric Specialty Exam: Physical Exam  Nursing note and vitals reviewed. Constitutional: He is oriented to person, place, and time. He appears well-developed.  Cardiovascular: Normal rate.   Neurological: He is oriented to person, place, and time.  Psychiatric: He has a normal mood and affect.     Review of Systems  Unable to perform ROS: Psychiatric disorder  Psychiatric/Behavioral: Positive for depression, hallucinations and suicidal ideas. The patient is nervous/anxious.     Blood pressure 124/80, pulse 89, temperature (!) 97.5 F (36.4 C), temperature source Oral, resp. rate 20, height 6' (1.829 m), weight 76.7 kg (169 lb).Body mass index is 22.92 kg/m.  General Appearance: Casual paper scrubs and  Malodorous  Eye Contact:  Fair  Speech:  Slow  Volume:  Normal  Mood:  Anxious  Affect:  Congruent and Depressed   Thought Process:  Goal Directed and Descriptions of Associations: Circumstantial   Orientation:  Other:  self, situation  Thought Content:  Paranoid Ideation and Rumination   Suicidal Thoughts:  No  Homicidal Thoughts:  No  Memory:  Immediate;   Fair Recent;   Fair Remote;   Poor  Judgement:  Impaired  Insight:  Lacking  Psychomotor Activity:  Decreased- more visible throughout the miliue today  Concentration:  Concentration: Fair and Attention Span: Fair  Recall:  [employer] of Knowledge:  Fair  Language:  Fair  Akathisia:  No  Handed:  Right  AIMS (if indicated):     Assets:  Desire for Improvement Financial Resources/Insurance Housing Physical Health Social Support  ADL's:  Intact  Cognition:  WNL  Sleep:  Number of Hours: 6.25     I agree with current treatment plan on 03/03/2017, Patient seen face-to-face for psychiatric evaluation follow-up, chart reviewed. Reviewed the information documented and agree with the treatment plan.  Will continue treatment. Daily contact with patient to assess and evaluate symptoms and progress in treatment.  Will continue today 03/03/17 plan as below except where it is noted.  - Continue clozapine 150 mg po  qhs, 50 mg po  daily (Confirmed patient registration at [facility]) - EKG reviewed; NSR, QTc 413 msec with is within normal range.  - Discussed with nurse to monitor bowel movement.  - Continue Fluoxetine 40 mg  po daily for depression - Continue propranolol 20 mg po BID for anxiety/fine tremors. - Continue Trazodone 50 mg po q hs for insomnia. -Order diabetic consult . -CSW will continue to work on disposition. Schedule to discharge on 03/04/2017 Labs reviewed: CBC wnl CMP: Na and Chloride decreased - f/u with PCP    Derrill Center, NP, 03/03/2017, 3:05 PM\   I agree with NP Progress Note

## 2017-03-03 NOTE — Progress Notes (Signed)
Patient denies SI, HI and AVH.  Patient has been attending groups and engaged in unit activities.  Patient had no incidents of behavioral dyscontrol.   Assess patient for safety, offer medications as prescribed, and engage patient in 1:1 staff talks, encourage patient to use effective coping skills.   Patient able to contract for safety, Continue to monitor for safety.  

## 2017-03-03 NOTE — Progress Notes (Addendum)
Results for HARLOW, CARRIZALES (MRN [id number]) as of 03/03/2017 06:17  Ref. Range 03/03/2017 06:11  Glucose-Capillary Latest Ref Range: 65 - 99 mg/dL 58 (L)    Gave Pt carton of milk to drink. Asymptomatic at this time. Provider on call Donell Sievert, PA notified. Will follow hypoglycemic protocol and re-check in 15 mins. Insulin held this a.m. Will monitor.

## 2017-03-03 NOTE — Progress Notes (Signed)
15 mins CBG recheck:   After 15 mins, BS was 89. Pt. continues to be asymptomatic. Will monitor.

## 2017-03-04 DIAGNOSIS — F39 Unspecified mood [affective] disorder: Secondary | ICD-10-CM

## 2017-03-04 LAB — GLUCOSE, CAPILLARY
GLUCOSE-CAPILLARY: 68 mg/dL (ref 65–99)
Glucose-Capillary: 154 mg/dL — ABNORMAL HIGH (ref 65–99)
Glucose-Capillary: 57 mg/dL — ABNORMAL LOW (ref 65–99)
Glucose-Capillary: 304 mg/dL — ABNORMAL HIGH (ref 65–99)
Glucose-Capillary: 161 mg/dL — ABNORMAL HIGH (ref 65–99)

## 2017-03-04 NOTE — Progress Notes (Signed)
D: Pt observed pacing the hallway. Pt at the time of assessment endorsed moderate anxiety and depression; "I am not ready to go; I feel very anxious and depressed right now." Pt denied AVH at the time however reports hearing voices earlier in the day. Pt denied SI, HI or pain.  A: Medications offered as prescribed. Pt given the opportunity to ask questions and state concerns. Support, encouragement, and safe environment provided. R: Pt was med compliant. All patient's questions and concerns addressed. 15-minute safety checks continue. Safety checks continue. Pt attended wrap-up group.

## 2017-03-04 NOTE — Progress Notes (Signed)
Adult Psychoeducational Group Note  Date:  03/04/2017 Time:  8:32 PM  Group Topic/Focus:  Wrap-Up Group:   The focus of this group is to help patients review their daily goal of treatment and discuss progress on daily workbooks.  Participation Level:  Active  Participation Quality:  Appropriate  Affect:  Appropriate  Cognitive:  Appropriate  Insight: Appropriate  Engagement in Group:  Engaged  Modes of Intervention:  Discussion  Additional Comments: The patient expressed that he attended the communication group.The patient also said that he rates today a  8.  Tony Cunningham 03/04/2017, 8:32 PM

## 2017-03-04 NOTE — Progress Notes (Signed)
DAR NOTE: Patient presents with anxious affect and depressed mood.  Denies pain, auditory and visual hallucinations.  Rates depression at 8, hopelessness at 6, and anxiety at 9.  Maintained on routine safety checks.  Medications given as prescribed.  Support and encouragement offered as needed.  Attended group and participated.  States goal for today is "to get discharged."  Patient remained in his room most of this shift.  Offered no complaint.

## 2017-03-04 NOTE — Progress Notes (Signed)
[facility] MD Progress Note  03/04/2017 2:55 PM Tony Cunningham  MRN:  [id number]   Subjective:  "I guess I'm ready to go." Denies SI/HI/AVH and contracts for safety  Objective: Patient's chart and findings reviewed and discussed with treatment team. Patient has remained calm and cooperative. He will be discharged tomorrow to a group home.    Principal Problem: Schizoaffective disorder, depressive type ([place]) Diagnosis:   Patient Active Problem List   Diagnosis Date Noted  . 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 Schizoaffective disorder, depressive type ([place]) [F25.1] 09/23/2016  . Polysubstance abuse ([place]) [F19.10] 07/17/2016  . Tardive dyskinesia [G24.01] 07/16/2016  . Tobacco use disorder [F17.200] 07/07/2016  . Dyslipidemia [E78.5] 07/07/2016  . Asthma [J45.909] 07/07/2016  . HTN (hypertension) [I10] 07/06/2016  . Diabetes ([place]) [E11.9] 12/25/2010   Total Time spent with patient: 15 minutes  Past Psychiatric History: See H&P  Past Medical History:  Past Medical History:  Diagnosis Date  . Anxiety   . Asthma   . Diabetes mellitus   . High blood pressure   . Sinus complaint    History reviewed. No pertinent surgical history. Family History: History reviewed. No pertinent family history. Family Psychiatric  History: See H&P Social History:  History  Alcohol Use No     History  Drug Use No    Social History   Social History  . Marital status: Single    Spouse name: [name]  . Number of children: [name]  . Years of education: [name]   Social History Main Topics  . Smoking status: Current Every Day Smoker    Packs/day: 0.50    Types: Cigarettes  . Smokeless tobacco: Never Used  . Alcohol use No  . Drug use: No  . Sexual activity: Not Currently   Other Topics Concern  . None   Social History Narrative  . None   Additional Social History:    Pain Medications: See MAR Prescriptions: See MAR Over the Counter: See MAR History of alcohol / drug use?:  No history of alcohol / drug abuse                    Sleep: Good  Appetite:  Good  Current Medications: Current Facility-Administered Medications  Medication Dose Route Frequency Provider Last Rate Last Dose  . acetaminophen (TYLENOL) tablet 650 mg  650 mg Oral Q6H PRN Lindon Romp A, NP   650 mg at 02/19/17 2129  . alum & mag hydroxide-simeth (MAALOX/MYLANTA) 200-200-20 MG/5ML suspension 30 mL  30 mL Oral Q4H PRN Lindon Romp A, NP      . cloZAPine (CLOZARIL) tablet 150 mg  150 mg Oral QHS Lindon Romp A, NP   150 mg at 03/03/17 2119  . cloZAPine (CLOZARIL) tablet 50 mg  50 mg Oral Daily Hisada, Elie Goody, MD   50 mg at 03/04/17 0902  . FLUoxetine (PROZAC) capsule 40 mg  40 mg Oral Daily Lindon Romp A, NP   40 mg at 03/04/17 0903  . insulin aspart (novoLOG) injection 0-15 Units  0-15 Units Subcutaneous TID WC Ursula Alert, MD   3 Units at 03/04/17 0916  . insulin aspart (novoLOG) injection 0-5 Units  0-5 Units Subcutaneous QHS Ursula Alert, MD   3 Units at 03/02/17 2112  . insulin aspart (novoLOG) injection 5 Units  5 Units Subcutaneous TID WC Donne Hazel, MD   5 Units at 03/04/17 1303  . insulin glargine (LANTUS) injection 40 Units  40 Units Subcutaneous QHS Lindon Romp  A, NP   40 Units at 03/03/17 2122  . lisinopril (PRINIVIL,ZESTRIL) tablet 20 mg  20 mg Oral Daily Lindon Romp A, NP   20 mg at 03/04/17 0902  . magnesium hydroxide (MILK OF MAGNESIA) suspension 30 mL  30 mL Oral Daily PRN Lindon Romp A, NP      . metFORMIN (GLUCOPHAGE) tablet 1,000 mg  1,000 mg Oral BID WC Lindon Romp A, NP   1,000 mg at 03/04/17 0903  . montelukast (SINGULAIR) tablet 10 mg  10 mg Oral QHS Lindon Romp A, NP   10 mg at 03/03/17 2120  . OLANZapine zydis (ZYPREXA) disintegrating tablet 5 mg  5 mg Oral TID PRN Ursula Alert, MD   5 mg at 02/19/17 2133   Or  . OLANZapine (ZYPREXA) injection 5 mg  5 mg Intramuscular TID PRN Eappen, Saramma, MD      . pantoprazole (PROTONIX) EC tablet 40 mg   40 mg Oral Daily Lindon Romp A, NP   40 mg at 03/04/17 0902  . propranolol (INDERAL) tablet 20 mg  20 mg Oral BID Lindon Romp A, NP   20 mg at 03/04/17 0903  . traZODone (DESYREL) tablet 50 mg  50 mg Oral QHS,MR X 1 Lindon Romp A, NP   50 mg at 03/03/17 2225    Lab Results:  Results for orders placed or performed during the hospital encounter of 02/14/17 (from the past 48 hour(s))  Glucose, capillary     Status: Abnormal   Collection Time: 03/02/17  5:01 PM  Result Value Ref Range   Glucose-Capillary 400 (H) 65 - 99 mg/dL  CBC with Differential/Platelet     Status: None   Collection Time: 03/02/17  6:30 PM  Result Value Ref Range   WBC 7.8 4.0 - 10.5 K/uL   RBC 5.00 4.22 - 5.81 MIL/uL   Hemoglobin 15.1 13.0 - 17.0 g/dL   HCT 43.4 39.0 - 52.0 %   MCV 86.8 78.0 - 100.0 fL   MCH 30.2 26.0 - 34.0 pg   MCHC 34.8 30.0 - 36.0 g/dL   RDW 12.4 11.5 - 15.5 %   Platelets 210 150 - 400 K/uL   Neutrophils Relative % 58 %   Neutro Abs 4.6 1.7 - 7.7 K/uL   Lymphocytes Relative 31 %   Lymphs Abs 2.4 0.7 - 4.0 K/uL   Monocytes Relative 7 %   Monocytes Absolute 0.5 0.1 - 1.0 K/uL   Eosinophils Relative 3 %   Eosinophils Absolute 0.3 0.0 - 0.7 K/uL   Basophils Relative 1 %   Basophils Absolute 0.0 0.0 - 0.1 K/uL    Comment: Performed at [facility], [place] [address], [place], [place] [address]  Comprehensive metabolic panel     Status: Abnormal   Collection Time: 03/02/17  6:30 PM  Result Value Ref Range   Sodium 132 (L) 135 - 145 mmol/L   Potassium 4.3 3.5 - 5.1 mmol/L   Chloride 93 (L) 101 - 111 mmol/L   CO2 27 22 - 32 mmol/L   Glucose, Bld 410 (H) 65 - 99 mg/dL   BUN 13 6 - 20 mg/dL   Creatinine, Ser 0.87 0.61 - 1.24 mg/dL   Calcium 8.9 8.9 - 10.3 mg/dL   Total Protein 6.8 6.5 - 8.1 g/dL   Albumin 4.0 3.5 - 5.0 g/dL   AST 20 15 - 41 U/L   ALT 20 17 - 63 U/L   Alkaline Phosphatase 77 38 - 126 U/L  Total Bilirubin 0.4 0.3 - 1.2 mg/dL   GFR calc non Af Amer >60  >60 mL/min   GFR calc Af Amer >60 >60 mL/min    Comment: (NOTE) The eGFR has been calculated using the CKD EPI equation. This calculation has not been validated in all clinical situations. eGFR's persistently <60 mL/min signify possible Chronic Kidney Disease.    Anion gap 12 5 - 15    Comment: Performed at [facility], [place] [address], [place], [place] [address]  Glucose, capillary     Status: Abnormal   Collection Time: 03/02/17  8:28 PM  Result Value Ref Range   Glucose-Capillary 259 (H) 65 - 99 mg/dL  Glucose, capillary     Status: Abnormal   Collection Time: 03/03/17  6:11 AM  Result Value Ref Range   Glucose-Capillary 58 (L) 65 - 99 mg/dL  Glucose, capillary     Status: None   Collection Time: 03/03/17  6:29 AM  Result Value Ref Range   Glucose-Capillary 89 65 - 99 mg/dL  Glucose, capillary     Status: None   Collection Time: 03/03/17 12:10 PM  Result Value Ref Range   Glucose-Capillary 99 65 - 99 mg/dL  Glucose, capillary     Status: Abnormal   Collection Time: 03/03/17  4:50 PM  Result Value Ref Range   Glucose-Capillary 329 (H) 65 - 99 mg/dL   Comment 1 Notify RN    Comment 2 Document in Chart   Glucose, capillary     Status: Abnormal   Collection Time: 03/03/17  8:37 PM  Result Value Ref Range   Glucose-Capillary 198 (H) 65 - 99 mg/dL  Glucose, capillary     Status: Abnormal   Collection Time: 03/04/17  6:21 AM  Result Value Ref Range   Glucose-Capillary 154 (H) 65 - 99 mg/dL  Glucose, capillary     Status: Abnormal   Collection Time: 03/04/17 11:34 AM  Result Value Ref Range   Glucose-Capillary 57 (L) 65 - 99 mg/dL  Glucose, capillary     Status: None   Collection Time: 03/04/17 12:02 PM  Result Value Ref Range   Glucose-Capillary 68 65 - 99 mg/dL    Blood Alcohol level:  Lab Results  Component Value Date   ETH <5 01/16/2017   ETH <5 35/32/9924    Metabolic Disorder Labs: Lab Results  Component Value Date   HGBA1C 7.6 (H)  01/19/2017   MPG 171.42 01/19/2017   MPG 249 11/09/2016   Lab Results  Component Value Date   PROLACTIN 24.5 (H) 09/24/2016   PROLACTIN 3.4 (L) 07/07/2016   Lab Results  Component Value Date   CHOL 122 01/19/2017   TRIG 154 (H) 01/19/2017   HDL 36 (L) 01/19/2017   CHOLHDL 3.4 01/19/2017   VLDL 31 01/19/2017   LDLCALC 55 01/19/2017   LDLCALC 59 12/28/2016    Physical Findings: AIMS: Facial and Oral Movements Muscles of Facial Expression: None, normal Lips and Perioral Area: None, normal Jaw: None, normal Tongue: None, normal,Extremity Movements Upper (arms, wrists, hands, fingers): None, normal Lower (legs, knees, ankles, toes): None, normal, Trunk Movements Neck, shoulders, hips: None, normal, Overall Severity Severity of abnormal movements (highest score from questions above): None, normal Incapacitation due to abnormal movements: None, normal Patient's awareness of abnormal movements (rate only patient's report): No Awareness, Dental Status Current problems with teeth and/or dentures?: No Does patient usually wear dentures?: No  CIWA:  CIWA-Ar Total: 1 COWS:  COWS Total Score: 1  Musculoskeletal: Strength & Muscle Tone: within normal limits Gait & Station: normal Patient leans: [name]  Psychiatric Specialty Exam: Physical Exam  Nursing note and vitals reviewed. Constitutional: He is oriented to person, place, and time. He appears well-developed and well-nourished.  Respiratory: Effort normal.  Musculoskeletal: Normal range of motion.  Neurological: He is alert and oriented to person, place, and time.  Skin: Skin is warm.    Review of Systems  Constitutional: Negative.   HENT: Negative.   Eyes: Negative.   Respiratory: Negative.   Cardiovascular: Negative.   Gastrointestinal: Negative.   Genitourinary: Negative.   Musculoskeletal: Negative.   Skin: Negative.   Neurological: Negative.   Endo/Heme/Allergies: Negative.   Psychiatric/Behavioral: Negative for  hallucinations and suicidal ideas.    Blood pressure 103/79, pulse (!) 107, temperature 98.3 F (36.8 C), temperature source Oral, resp. rate 16, height 6' (1.829 m), weight 76.7 kg (169 lb).Body mass index is 22.92 kg/m.  General Appearance: Casual  Eye Contact:  Fair  Speech:  Clear and Coherent and Normal Rate  Volume:  Normal  Mood:  Depressed  Affect:  Flat  Thought Process:  Goal Directed and Descriptions of Associations: Intact  Orientation:  Full (Time, Place, and Person)  Thought Content:  WDL  Suicidal Thoughts:  No  Homicidal Thoughts:  No  Memory:  Immediate;   Good Recent;   Good  Judgement:  Fair  Insight:  Fair  Psychomotor Activity:  Normal  Concentration:  Concentration: Good and Attention Span: Good  Recall:  Good  Fund of Knowledge:  Good  Language:  Good  Akathisia:  No  Handed:  Right  AIMS (if indicated):     Assets:  Financial Resources/Insurance Housing Social Support  ADL's:  Intact  Cognition:  WNL  Sleep:  Number of Hours: 5.75     Treatment Plan Summary: Daily contact with patient to assess and evaluate symptoms and progress in treatment, Medication management and Plan is to:  -Continue Clozaril 50 mg PO Daily for mood stability -Continue Prozac 40 mg PO Daily for mood stability -Continue Clozaril 150 mg PO QHS for mood stability -Continue Propranolol 20 mg PO BID  -Continue Trazodone 50 mg PO QHS PRN for insomnia -Encourage group therapy participation   Lewis Shock, FNP 03/04/2017, 2:55 PM   I agree with NP Progress Note

## 2017-03-04 NOTE — [facility] Group Notes (Signed)
LCSW Group Therapy 03/04/2017 1:15pm  Type of Therapy and Topic:  Group Therapy:  Change and Accountability  Participation Level:  Did Not Attend  Description of Group In this group, patients discussed power and accountability for change.  The group identified the challenges related to accountability and the difficulty of accepting the outcomes of negative behaviors.  Patients were encouraged to openly discuss a challenge/change they could take responsibility for.  Patients discussed the use of "change talk" and positive thinking as ways to support achievement of personal goals.  The group discussed ways to give support and empowerment to peers.  Therapeutic Goals: 1. Patients will state the relationship between personal power and accountability in the change process 2. Patients will identify the positive and negative consequences of a personal choice they have made 3. Patients will identify one challenge/choice they will take responsibility for making 4. Patients will discuss the role of "change talk" and the impact of positive thinking as it supports successful personal change 5. Patients will verbalize support and affirmation of change efforts in peers  Summary of Patient Progress:    Therapeutic Modalities Solution Focused Brief Therapy Motivational Interviewing Cognitive Behavioral Therapy  Carlynn Herald Work 03/04/2017 2:40 PM

## 2017-03-04 NOTE — Progress Notes (Signed)
Recreation Therapy Notes  Date: 03/04/17 Time: 1000 Location: [address] Dayroom  Group Topic: Communication, Team Building, Problem Solving  Goal Area(s) Addresses:  Patient will effectively work with peer towards shared goal.  Patient will identify skill used to make activity successful.  Patient will identify how skills used during activity can be used to reach post d/c goals.   Behavioral Response: Engaged  Intervention: STEM Activity   Activity: Straw Bridge. In teams, patients were asked to build a straw bridge that could hold a small puzzle box.  Each group was given 20 straws and 2 ft of masking tape to complete the project.    Education: Pharmacist, community, Building control surveyor.   Education Outcome: Acknowledges education/In group clarification offered/Needs additional education.   Clinical Observations/Feedback: Pt worked well with his group.  Pt assisted with taping the straws together to get the bridge to stand.  Pt did not participate in processing.    [name], LRT/CTRS        [name] A 03/04/2017 11:33 AM

## 2017-03-05 LAB — GLUCOSE, CAPILLARY
GLUCOSE-CAPILLARY: 184 mg/dL — AB (ref 65–99)
GLUCOSE-CAPILLARY: 207 mg/dL — AB (ref 65–99)
Glucose-Capillary: 50 mg/dL — ABNORMAL LOW (ref 65–99)
Glucose-Capillary: 91 mg/dL (ref 65–99)
Glucose-Capillary: 315 mg/dL — ABNORMAL HIGH (ref 65–99)

## 2017-03-05 MED ORDER — INSULIN GLARGINE 100 UNIT/ML ~~LOC~~ SOLN: 40.0000 [IU] | Freq: Every day | SUBCUTANEOUS | 0 refills | Status: DC

## 2017-03-05 MED ORDER — PROPRANOLOL HCL 20 MG PO TABS: 20.0000 mg | ORAL_TABLET | Freq: Two times a day (BID) | ORAL | 0 refills | Status: DC

## 2017-03-05 MED ORDER — LISINOPRIL 20 MG PO TABS: 20.0000 mg | ORAL_TABLET | Freq: Every day | ORAL | 0 refills | Status: DC

## 2017-03-05 MED ORDER — CLOZAPINE 50 MG PO TABS: 50.0000 mg | ORAL_TABLET | Freq: Every day | ORAL | 0 refills | Status: DC

## 2017-03-05 MED ORDER — PANTOPRAZOLE SODIUM 40 MG PO TBEC: 40.0000 mg | DELAYED_RELEASE_TABLET | Freq: Every day | ORAL | 0 refills | Status: AC

## 2017-03-05 MED ORDER — TRAZODONE HCL 50 MG PO TABS
50.0000 mg | ORAL_TABLET | Freq: Every day | ORAL | Status: DC
  Filled 2017-03-05: qty 7

## 2017-03-05 MED ORDER — METFORMIN HCL 1000 MG PO TABS: 1000.0000 mg | ORAL_TABLET | Freq: Two times a day (BID) | ORAL | 0 refills | Status: DC

## 2017-03-05 MED ORDER — CLOZAPINE 50 MG PO TABS: 150.0000 mg | ORAL_TABLET | Freq: Every day | ORAL | 0 refills | Status: DC

## 2017-03-05 MED ORDER — INSULIN ASPART 100 UNIT/ML ~~LOC~~ SOLN: 5.0000 [IU] | Freq: Three times a day (TID) | SUBCUTANEOUS | 0 refills | Status: DC

## 2017-03-05 MED ORDER — FLUOXETINE HCL 40 MG PO CAPS: 40.0000 mg | ORAL_CAPSULE | Freq: Every day | ORAL | 0 refills | Status: DC

## 2017-03-05 MED ORDER — MONTELUKAST SODIUM 10 MG PO TABS: 10.0000 mg | ORAL_TABLET | Freq: Every day | ORAL | 0 refills | Status: AC

## 2017-03-05 MED ORDER — TRAZODONE HCL 50 MG PO TABS: ORAL_TABLET | ORAL | 0 refills | Status: DC

## 2017-03-05 NOTE — Progress Notes (Signed)
DAR NOTE: Patient presents with anxious affect and depressed mood.  Denies pain, auditory and visual hallucinations.  Rates depression at 7, hopelessness at 6, and anxiety at 8.  Maintained on routine safety checks.  Medications given as prescribed.  Support and encouragement offered as needed.  Attended group and participated.  States goal for today is "discharge."  Patient observed socializing with peers in the dayroom.  Offered no complaint.

## 2017-03-05 NOTE — Discharge Summary (Signed)
Physician Discharge Summary Note  Patient:  Tony Cunningham is an 41 y.o., male MRN:  [id number] DOB:  09/30/1975  Patient phone:  [phone] (home)   Patient address:   [address] [place] [address],   Total Time spent with patient: Greater than 30 minutes  Date of Admission:  02/14/2017 Date of Discharge: 03-08-17  Reason for Admission: .Per HPI-Paulino Gerling is a 52 y old CM who is single , has a chronic hx of mental illness as well as several recent IP admissions, presented to [facility] from [facility] with worsening SI/HI [name]. Patient seen and chart reviewed.Discussed patient with treatment team. Attempted to evaluate patient several times today. This AM RN notified writer that patient appeared lethargic and had urinary incontinence. However as the day progressed he became more organized and was noted as taking shower , able to take care of ADL s as well as sit down for an evaluation.  Patient reported that he became more and more depressed and started hearing mean voices. He reported that he had thoughts to hurt his mother and that kind of scared him. Pt unable to elaborate what exactly the voices tell him. Pt noted as having some thought blocking as well as some difficulty remembering events from the past . Pt had ECT treatments at [facility] recently which could have led to his memory issues. Pt became tearful when Clinical research associate discussed this. Pt being a poor historian majority of information obtained from EHR . Patient was just discharged from [facility] on 02/10/2017 . He was admitted there since the 50 th of august. Pt was admitted to [facility] with suicidal thoughts.Pt received ECT treatments at [facility] and was discharged with plan to have maintenance ECT on the 28 th September. Pt per notes had to be coached with discharge plan and was hesitant to get out of the hospital. Pt also on Clozaril - unknown how effective . He has failed multiple medication trials in the past , he also has  neuroleptic induced parkinsonian sx.  Principal Problem: Schizoaffective disorder, depressive type [facility])  Discharge Diagnoses: Patient Active Problem List   Diagnosis Date Noted  . 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 Schizoaffective disorder, depressive type (HCC) [F25.1] 09/23/2016  . Polysubstance abuse (HCC) [F19.10] 07/17/2016  . Tardive dyskinesia [G24.01] 07/16/2016  . Tobacco use disorder [F17.200] 07/07/2016  . Dyslipidemia [E78.5] 07/07/2016  . Asthma [J45.909] 07/07/2016  . HTN (hypertension) [I10] 07/06/2016  . Diabetes (HCC) [E11.9] 12/25/2010   Past Psychiatric History: Major depression  Past Medical History:  Past Medical History:  Diagnosis Date  . Anxiety   . Asthma   . Diabetes mellitus   . High blood pressure   . Sinus complaint    History reviewed. No pertinent surgical history.  Family History: History reviewed. No pertinent family history.  Family Psychiatric  History: See H&P  Social History:  History  Alcohol Use No     History  Drug Use No    Social History   Social History  . Marital status: Single    Spouse name: [name]  . Number of children: [name]  . Years of education: [name]   Social History Main Topics  . Smoking status: Current Every Day Smoker    Packs/day: 0.50    Types: Cigarettes  . Smokeless tobacco: Never Used  . Alcohol use No  . Drug use: No  . Sexual activity: Not Currently   Other Topics Concern  . None   Social  History Narrative  . None   Hospital Course:.   During the course of his hospitalization, L[place]is received & was  discharged on, Amantadine 100 mg for parkinsonian tremors, Benztropine 1 mg for EPS, Nicorette gum  2 mg for smoking cessation, Citalopram 20 mg for depression, Clozaril 150 mg for mood control & Restoril 7.5 mg for insomnia. He was enrolled & seldom participated in the group counseling sessions being offered & held on this unit. This is to learn coping skills that should  help him cope better & maintain mood stability after discharge. He was resumed on all his pertinent home medications for the other previously existing medical issues that he presented. He tolerated his treatment regimen without any adverse effects or reactions reported.   While his treatment was on going, Elfego was assessed on daily basis by the clinicians to assure that his symptoms were responding to his treatment regimen. It took quite some time & some medication dose changes to get his symptoms to subside. His improvement was noted by observation & his daily reports of gradual symptom reduction. He was evaluated daily by the treatment team for mood stability & the need for continued recovery after discharge. He was offered further treatment option on an outpatient basis as noted below. And because of the chronic nature of his mental health conditions, limited ability to care for himself & the problem with medication compliance, Jontavius is now placed in a group home setting upon discharge. This is to assure continuity of his mental health, medical health & self care after discharge from here on.      Upon discharge, Flora was both mentally & medically stable. He is currently denying suicidal, homicidal ideation, auditory, visual/tactile hallucinations, delusional thoughts & or paranoia. He presents alert, attentive, improved grooming. He is calm & cooperative. Speech is limited and answers most questions with monosyllables or head gestures. Of note, when RN and CSW he is familiar with came into the room, he brightened considerably, smiled and became more verbal. Reports mood as "OK" , affect is blunted & yet, reactive, currently denies hallucinations. Does not appear internally preoccupied . No delusions are expressed at this time, denies suicidal or self injurious ideations, denies homicidal or violent ideations. No disruptive or overtly agitated behaviors on unit. FS improved to 205. PPD was read several  days ago- reported as negative. Maurie left [facility] with all personal belongings in no apparent distress. Transportation per his group home staff.  Physical Findings: AIMS: Facial and Oral Movements Muscles of Facial Expression: None, normal Lips and Perioral Area: None, normal Jaw: None, normal Tongue: None, normal,Extremity Movements Upper (arms, wrists, hands, fingers): None, normal Lower (legs, knees, ankles, toes): None, normal, Trunk Movements Neck, shoulders, hips: None, normal, Overall Severity Severity of abnormal movements (highest score from questions above): None, normal Incapacitation due to abnormal movements: None, normal Patient's awareness of abnormal movements (rate only patient's report): No Awareness, Dental Status Current problems with teeth and/or dentures?: No Does patient usually wear dentures?: No  CIWA:  CIWA-Ar Total: 1 COWS:  COWS Total Score: 1  Musculoskeletal: Strength & Muscle Tone: within normal limits Gait & Station: normal Patient leans: [name]  Psychiatric Specialty Exam: See SRA by MD  Physical Exam  Constitutional: He appears well-developed.  HENT:  Head: Normocephalic.  Eyes: Pupils are equal, round, and reactive to light.  Neck: Normal range of motion.  Cardiovascular: Normal rate.   Respiratory: Effort normal.  GI: Soft.  Genitourinary:  Genitourinary Comments: Deferred  Musculoskeletal: Normal range of motion.  Neurological: He is alert.  Skin: Skin is warm.    Review of Systems  Constitutional: Negative.   HENT: Negative.   Eyes: Negative.   Respiratory: Negative.   Cardiovascular: Negative.   Gastrointestinal: Negative.   Genitourinary: Negative.   Musculoskeletal: Negative.   Skin: Negative.   Neurological: Negative.   Endo/Heme/Allergies: Negative.   Psychiatric/Behavioral: Positive for depression (Stable ) and hallucinations (Hx. psychosis). Negative for memory loss, substance abuse and suicidal ideas. The patient has  insomnia (Stable). The patient is not nervous/anxious.     Blood pressure 132/85, pulse 72, temperature 98.3 F (36.8 C), temperature source Oral, resp. rate 16, height 6' (1.829 m), weight 76.7 kg (169 lb).Body mass index is 22.92 kg/m.  See Md's SRA   Have you used any form of tobacco in the last 30 days? (Cigarettes, Smokeless Tobacco, Cigars, and/or Pipes): Yes  Has this patient used any form of tobacco in the last 30 days? (Cigarettes, Smokeless Tobacco, Cigars, and/or Pipes):Yes, provided with nicotine patch prescription.  Blood Alcohol level:  Lab Results  Component Value Date   [facility] <5 01/16/2017   ETH <5 12/21/2016   Metabolic Disorder Labs:  Lab Results  Component Value Date   HGBA1C 7.6 (H) 01/19/2017   MPG 171.42 01/19/2017   MPG 249 11/09/2016   Lab Results  Component Value Date   PROLACTIN 24.5 (H) 09/24/2016   PROLACTIN 3.4 (L) 07/07/2016   Lab Results  Component Value Date   CHOL 122 01/19/2017   TRIG 154 (H) 01/19/2017   HDL 36 (L) 01/19/2017   CHOLHDL 3.4 01/19/2017   VLDL 31 01/19/2017   LDLCALC 55 01/19/2017   LDLCALC 59 12/28/2016   See Psychiatric Specialty Exam and Suicide Risk Assessment completed by Attending Physician prior to discharge.  Discharge destination:  Other:  [place]  Is patient on multiple antipsychotic therapies at discharge:  No   Has Patient had three or more failed trials of antipsychotic monotherapy by history:  No  Recommended Plan for Multiple Antipsychotic Therapies: NA  Allergies as of 03/05/2017      Reactions   Seroquel [quetiapine Fumarate]    dizziness      Medication List    STOP taking these medications   amantadine 100 MG capsule Commonly known as:  SYMMETREL   citalopram 20 MG tablet Commonly known as:  CELEXA   simvastatin 20 MG tablet Commonly known as:  ZOCOR   temazepam 7.5 MG capsule Commonly known as:  RESTORIL     TAKE these medications     Indication  clozapine 50 MG  tablet Commonly known as:  CLOZARIL Take 3 tablets (150 mg total) by mouth at bedtime. For mood control What changed:  additional instructions  Indication:  Schizoaffective Disorder   clozapine 50 MG tablet Commonly known as:  CLOZARIL Take 1 tablet (50 mg total) by mouth daily. For mood control What changed:  You were already taking a medication with the same name, and this prescription was added. Make sure you understand how and when to take each.  Indication:  Mood control   FLUoxetine 40 MG capsule Commonly known as:  PROZAC Take 1 capsule (40 mg total) by mouth daily. For depression What changed:  additional instructions  Indication:  Major Depressive Disorder   insulin aspart 100 UNIT/ML injection Commonly known as:  novoLOG Inject 5 Units into the skin 3 (three) times daily with meals. For diabetes management What changed:  additional instructions  Indication:  Type 2 Diabetes   insulin glargine 100 UNIT/ML injection Commonly known as:  LANTUS Inject 0.4 mLs (40 Units total) into the skin at bedtime. For diabetes management What changed:  additional instructions  Indication:  Type 2 Diabetes   lisinopril 20 MG tablet Commonly known as:  PRINIVIL,ZESTRIL Take 1 tablet (20 mg total) by mouth daily. For high blood pressure What changed:  additional instructions  Indication:  High Blood Pressure Disorder   metFORMIN 1000 MG tablet Commonly known as:  GLUCOPHAGE Take 1 tablet (1,000 mg total) by mouth 2 (two) times daily with a meal. For diabetes management What changed:  additional instructions  Indication:  Type 2 Diabetes   montelukast 10 MG tablet Commonly known as:  SINGULAIR Take 1 tablet (10 mg total) by mouth at bedtime. For Asthma What changed:  additional instructions  Indication:  Asthma   pantoprazole 40 MG tablet Commonly known as:  PROTONIX Take 1 tablet (40 mg total) by mouth daily. For acid reflux What changed:  additional instructions   Indication:  Gastroesophageal Reflux Disease   propranolol 20 MG tablet Commonly known as:  INDERAL Take 1 tablet (20 mg total) by mouth 2 (two) times daily. For tremors/anxiety What changed:  additional instructions  Indication:  High Blood Pressure Disorder, anxiety, tremors   traZODone 50 MG tablet Commonly known as:  DESYREL Take 1 tablet (50 mg) by mouth at bedtime: For sleep  Indication:  Trouble Sleeping      Follow-up Information    [facility] Follow up on 03/17/2017.   Why:  Wednesday at12:45 to fill out paperwork for a 1:20 appointment.  Bring along copy of insurance cards Contact information: [address] [place]  [919] [phone]         Follow-up recommendations: Activity:  As tolerated Diet: As recommended by your primary care doctor. Keep all scheduled follow-up appointments as recommended.  Comments:Take all medications as prescribed. Keep all follow-up appointments as scheduled.  Do not consume alcohol or use illegal drugs while on prescription medications. Report any adverse effects from your medications to your primary care provider promptly.  In the event of recurrent symptoms or worsening symptoms, call 911, a crisis hotline, or go to the nearest emergency department for evaluation.    Signed: Oneta Rack, NP 03/05/2017, 9:57 AM Patient seen, Suicide Assessment Completed.  Disposition Plan Reviewed

## 2017-03-05 NOTE — Progress Notes (Signed)
Nursing Progress Note [id number]  D) Patient presents cooperative but is anxious this evening. Patient compliant with medications and attended group this evening. Patient observed up in the milieu. Patient seeks reassurance for his safety and general wellfare. Patient asks, "what will happen to me?" Support and encouragement provided by staff. Patient appears reassured with staff communication. Patient  Denies SI/HI/AVH or pain. Patient contracts for safety on the unit. Patient reports sleeping well with current regimen. CBG monitored and treated as ordered.  A) Emotional support given. 1:1 interaction and active listening provided. Patient medicated as prescribed. Medications and plan of care reviewed with patient. Patient verbalized understanding without further questions. Snacks and fluids provided. Opportunities for questions or concerns presented to patient. Patient encouraged to continue to work on treatment goals. Labs, vital signs and patient behavior monitored throughout shift. Patient safety maintained with q15 min safety checks. Low fall risk precautions in place and reviewed with patient; patient verbalized understanding.  R) Patient receptive to interaction with nurse. Patient remains safe on the unit at this time. Patient denies any adverse medication reactions at this time. Patient is resting in bed without complaints. Will continue to monitor.

## 2017-03-05 NOTE — Tx Team (Signed)
Interdisciplinary Treatment and Diagnostic Plan Update  03/05/2017 Time of Session: 2:42 PM  Tony Cunningham MRN: [id number]  Principal Diagnosis: Schizoaffective disorder, depressive type [facility])  Secondary Diagnoses: Principal Problem:   Schizoaffective disorder, depressive type (HCC) Active Problems:   Diabetes (HCC)   Neuroleptic-induced Parkinsonism (HCC)   Current Medications:  Current Facility-Administered Medications  Medication Dose Route Frequency Provider Last Rate Last Dose  . acetaminophen (TYLENOL) tablet 650 mg  650 mg Oral Q6H PRN Nira Conn A, NP   650 mg at 02/19/17 2129  . alum & mag hydroxide-simeth (MAALOX/MYLANTA) 200-200-20 MG/5ML suspension 30 mL  30 mL Oral Q4H PRN Nira Conn A, NP      . cloZAPine (CLOZARIL) tablet 150 mg  150 mg Oral QHS Nira Conn A, NP   150 mg at 03/04/17 2141  . cloZAPine (CLOZARIL) tablet 50 mg  50 mg Oral Daily Hisada, Barbee Cough, MD   50 mg at 03/05/17 0848  . FLUoxetine (PROZAC) capsule 40 mg  40 mg Oral Daily Nira Conn A, NP   40 mg at 03/05/17 0848  . insulin aspart (novoLOG) injection 0-15 Units  0-15 Units Subcutaneous TID WC Jomarie Longs, MD   11 Units at 03/04/17 1657  . insulin aspart (novoLOG) injection 0-5 Units  0-5 Units Subcutaneous QHS Jomarie Longs, MD   3 Units at 03/02/17 2112  . insulin aspart (novoLOG) injection 5 Units  5 Units Subcutaneous TID WC Jerald Kief, MD   5 Units at 03/04/17 1810  . insulin glargine (LANTUS) injection 40 Units  40 Units Subcutaneous QHS Nira Conn A, NP   40 Units at 03/04/17 2145  . lisinopril (PRINIVIL,ZESTRIL) tablet 20 mg  20 mg Oral Daily Nira Conn A, NP   20 mg at 03/05/17 0849  . magnesium hydroxide (MILK OF MAGNESIA) suspension 30 mL  30 mL Oral Daily PRN Nira Conn A, NP      . metFORMIN (GLUCOPHAGE) tablet 1,000 mg  1,000 mg Oral BID WC Nira Conn A, NP   1,000 mg at 03/05/17 0848  . montelukast (SINGULAIR) tablet 10 mg  10 mg Oral QHS Nira Conn A, NP   10 mg at  03/04/17 2143  . OLANZapine zydis (ZYPREXA) disintegrating tablet 5 mg  5 mg Oral TID PRN Jomarie Longs, MD   5 mg at 02/19/17 2133   Or  . OLANZapine (ZYPREXA) injection 5 mg  5 mg Intramuscular TID PRN Eappen, Saramma, MD      . pantoprazole (PROTONIX) EC tablet 40 mg  40 mg Oral Daily Nira Conn A, NP   40 mg at 03/05/17 0847  . propranolol (INDERAL) tablet 20 mg  20 mg Oral BID Nira Conn A, NP   20 mg at 03/05/17 0848  . traZODone (DESYREL) tablet 50 mg  50 mg Oral QHS,MR X 1 Nira Conn A, NP   50 mg at 03/04/17 2300    PTA Medications: Prescriptions Prior to Admission  Medication Sig Dispense Refill Last Dose  . amantadine (SYMMETREL) 100 MG capsule Take 100 mg by mouth 2 (two) times daily.   02/11/2017  . citalopram (CELEXA) 20 MG tablet Take 20 mg by mouth daily.   02/11/2017  . cloZAPine (CLOZARIL) 50 MG tablet Take 3 tablets (150 mg total) by mouth at bedtime. 90 tablet 1 02/11/2017  . FLUoxetine (PROZAC) 40 MG capsule Take 1 capsule (40 mg total) by mouth daily. 30 capsule 1 02/11/2017  . insulin glargine (LANTUS) 100 UNIT/ML injection Inject 0.4 mLs (40  Units total) into the skin at bedtime. (Patient taking differently: Inject 45 Units into the skin at bedtime. ) 10 mL 2 02/11/2017  . lisinopril (PRINIVIL,ZESTRIL) 20 MG tablet Take 1 tablet (20 mg total) by mouth daily. 30 tablet 1 02/11/2017  . metFORMIN (GLUCOPHAGE) 1000 MG tablet Take 1 tablet (1,000 mg total) by mouth 2 (two) times daily with a meal. 60 tablet 1 02/11/2017  . pantoprazole (PROTONIX) 40 MG tablet Take 1 tablet (40 mg total) by mouth daily. 30 tablet 1 02/11/2017  . propranolol (INDERAL) 20 MG tablet Take 1 tablet (20 mg total) by mouth 2 (two) times daily. 60 tablet 1 02/11/2017  . simvastatin (ZOCOR) 20 MG tablet Take 20 mg by mouth daily.   02/11/2017  . temazepam (RESTORIL) 7.5 MG capsule Take 7.5 mg by mouth at bedtime.   02/11/2017  . [DISCONTINUED] insulin aspart (NOVOLOG) 100 UNIT/ML injection Inject 5 Units  into the skin 3 (three) times daily with meals. (Patient taking differently: Inject 6 Units into the skin 3 (three) times daily with meals. ) 10 mL 2 02/11/2017  . montelukast (SINGULAIR) 10 MG tablet Take 1 tablet (10 mg total) by mouth at bedtime. (Patient not taking: Reported on 02/15/2017) 30 tablet 1 Not Taking at Unknown time    Patient Stressors: Marital or family conflict Other: blood sugar  Patient Strengths: General fund of knowledge Motivation for treatment/growth  Treatment Modalities: Medication Management, Group therapy, Case management,  1 to 1 session with clinician, Psychoeducation, Recreational therapy.   Physician Treatment Plan for Primary Diagnosis: Schizoaffective disorder, depressive type (HCC) Long Term Goal(s): Improvement in symptoms so as ready for discharge  Short Term Goals: Ability to verbalize feelings will improve Ability to demonstrate self-control will improve Compliance with prescribed medications will improve Ability to verbalize feelings will improve Ability to identify and develop effective coping behaviors will improve Compliance with prescribed medications will improve  Medication Management: Evaluate patient's response, side effects, and tolerance of medication regimen.  Therapeutic Interventions: 1 to 1 sessions, Unit Group sessions and Medication administration.  Evaluation of Outcomes: Adequate for Discharge   9/27: Patient with schizoaffective do , continues to have thought blocking , negative sx as well as HI. Pt continues to need a lot of support to take care of his ADLs as well as his medications.   Pt will benefit from being in a supervised, structured place - since he has been in and out of long hospital stays since May 2018 .  He has failed multiple trials of medications as well as recent ECT treatments( this month).   Increase Clozaril to 25 mg po daily and 150 mg po qhs for psychosis. CBC with diff as per Clozaril therapy  needs - Pharmacy consult placed- Results for Tony, Cunningham (MRN [id number]) as of 02/17/2017 11:46  Prozac 40 mg po daily for affective sx. Propranolol 20 mg po bid for anxiety, tachycardia. Trazodone 50 mg po qhs prn for insomnia. Pharmacy consult for clozaril therapy. Repeat BMP for hx of hyponatremia - WNL. Consulted Dr.Clapac regarding ECT treatments - per Dr.Clapac - pt  will not be a good candidate at this time. Will refer to [facility] for further management. Will give PPD today 02/17/2017 for possible [facility] referral.  10/2: Continue clozapine 150 mg qhs, 50 mg daily (Confirmed patient registration at [facility]) - EKG reviewed; NSR, QTc 413 msec.  - Discussed with nurse to monitor bowel movement.  - Continue fluoxetine 40 mg daily for depression - Continue propranolol 20  mg BID for anxiety/fine tremors. - Continue Trazodone 50 mg q hs for insomnia.   Physician Treatment Plan for Secondary Diagnosis: Principal Problem:   Schizoaffective disorder, depressive type (HCC) Active Problems:   Diabetes (HCC)   Neuroleptic-induced Parkinsonism (HCC)   Long Term Goal(s): Improvement in symptoms so as ready for discharge  Short Term Goals: Ability to verbalize feelings will improve Ability to demonstrate self-control will improve Compliance with prescribed medications will improve Ability to verbalize feelings will improve Ability to identify and develop effective coping behaviors will improve Compliance with prescribed medications will improve  Medication Management: Evaluate patient's response, side effects, and tolerance of medication regimen.  Therapeutic Interventions: 1 to 1 sessions, Unit Group sessions and Medication administration.  Evaluation of Outcomes: Adequate for Discharge   RN Treatment Plan for Primary Diagnosis: Schizoaffective disorder, depressive type (HCC) Long Term Goal(s): Knowledge of disease and therapeutic regimen to maintain health will improve  Short Term Goals:  Ability to disclose and discuss suicidal ideas and Ability to identify and develop effective coping behaviors will improve  Medication Management: RN will administer medications as ordered by provider, will assess and evaluate patient's response and provide education to patient for prescribed medication. RN will report any adverse and/or side effects to prescribing provider.  Therapeutic Interventions: 1 on 1 counseling sessions, Psychoeducation, Medication administration, Evaluate responses to treatment, Monitor vital signs and CBGs as ordered, Perform/monitor CIWA, COWS, AIMS and Fall Risk screenings as ordered, Perform wound care treatments as ordered.  Evaluation of Outcomes: Adequate for Discharge   LCSW Treatment Plan for Primary Diagnosis: Schizoaffective disorder, depressive type (HCC) Long Term Goal(s): Safe transition to appropriate next level of care at discharge, Engage patient in therapeutic group addressing interpersonal concerns.  Short Term Goals: Engage patient in aftercare planning with referrals and resources  Therapeutic Interventions: Assess for all discharge needs, 1 to 1 time with Social worker, Explore available resources and support systems, Assess for adequacy in community support network, Educate family and significant other(s) on suicide prevention, Complete Psychosocial Assessment, Interpersonal group therapy.  Evaluation of Outcomes: Progressing,  Referred pt to both group home and [facility]. Additional labs-will send when completed.  Pt and I are to call mother on weekend when mother is available with help of interpreter.   10/2:  Pt on [facility] wait list.  Visit tomorrow with [facility] representative for possible placement.  Have been unable to reach pt's mother via phone with pt.  Pt told me yesterday that she is not available until after 5PM.  Possible treatment team meeting coordinated by [facility] on Thursday at 11:00  10/5:  Remains on [facility] wait list.   Accepted at [facility] in [place].  CSW to update care coordinator and mother re continued discharge planning to community.    10/8:  Pt to [facility] tomorrow-Ms Sanders-[phone]  10/12:  Ms Allyne Gee did not come Tuesday due to needing to address possible obstacles of MCD since family cannot produce a card, and making sure that getting his Clozaril and accompanying labs will not be an issue when he gets there.  Did not come Thursday as planned due to weather. Did not come today as Wendle called her and told her he was not ready until Monday. Today she states she will come Monday.  Progress in Treatment: Attending groups:Intermittently Participating in groups: Minimally Taking medication as prescribed: Yes Toleration medication: Yes, no side effects reported at this time Family/Significant other contact made: No Patient understands diagnosis: No Limited  insight Discussing patient identified problems/goals with staff: Yes Medical problems stabilized or resolved: Yes Denies suicidal/homicidal ideation: Yes Issues/concerns per patient self-inventory: None Other: [name]  New problem(s) identified: None identified at this time.   New Short Term/Long Term Goal(s): Chose to not participate in treatment team process. 10/5:  Patient referred for group home placement, [facility] accepted patient.  CSW to contact mother to discuss visitation options.  Will arrange aftercare in area of group home, CSW to discuss options for transportation  Discharge Plan or Barriers: Patient remaining agreeable to group home placement, weekend CSW to discuss options for visitation and contact w mother and update [facility] care coordinator.    Reason for Continuation of Hospitalization:  Medication stabilization  Estimated Length of Stay: Likely d/c Monday  Attendees: Patient:  03/05/2017  2:42 PM  Physician: Nehemiah Massed, MD 03/05/2017  2:42 PM  Nursing: Roddie Mc, RN 03/05/2017  2:42 PM  RN Care Manager:   03/05/2017  2:42 PM  Social Worker: Richelle Ito LCSW 03/05/2017  2:42 PM  Recreational Therapist:  03/05/2017  2:42 PM  Other:  03/05/2017  2:42 PM  Other:  03/05/2017  2:42 PM    Scribe for Treatment Team:  Richelle Ito LCSW 03/05/2017 2:42 PM

## 2017-03-05 NOTE — Plan of Care (Signed)
Problem: [facility] Participation in Recreation Therapeutic Interventions Goal: STG-Patient will attend/participate in Rec Therapy Group Ses STG-The Patient will attend and participate in Recreation Therapy Group Sessions  Outcome: Adequate for Discharge Pt attended and participated in team building recreation therapy sessions.  [name], LRT/CTRS

## 2017-03-05 NOTE — [facility] Group Notes (Signed)
[facility] LCSW Group Therapy  03/05/2017  1:05 PM  Type of Therapy:  Group therapy  Participation Level:  Active  Participation Quality:  Attentive  Affect:  Flat  Cognitive:  Oriented  Insight:  Limited  Engagement in Therapy:  Limited  Modes of Intervention:  Discussion, Socialization  Summary of Progress/Problems:  Chaplain was here to lead a group on themes of hope and courage.Invited.  Chose to not attend.  [name] 03/05/2017 1:47 PM

## 2017-03-05 NOTE — Plan of Care (Signed)
Problem: Safety: Goal: Periods of time without injury will increase Outcome: Progressing Patient is on q15 minute safety checks and low fall risk precautions. Patient contracts for safety on the unit and remains safe at this time.   

## 2017-03-05 NOTE — Progress Notes (Signed)
Hypoglycemic Event  CBG: 50 @.  Treatment: 15 GM carbohydrate snack  Symptoms: None  Follow-up CBG: Time: 0644 CBG Result: 91  Possible Reasons for Event: other: brittle diabetes  Comments/MD notified:Oncall PA notified    Ryin Schillo T Sergei Delo

## 2017-03-05 NOTE — Progress Notes (Signed)
Recreation Therapy Notes  Date: 03/05/17 Time: 1015 Location: 500 Hall Dayroom  Group Topic: Communication  Goal Area(s) Addresses:  Patient will effectively communicate with peers in group.  Patient will verbalize benefit of healthy communication. Patient will verbalize positive effect of healthy communication on post d/c goals.  Patient will identify communication techniques that made activity effective for group.   Intervention:  [employer], writing utensils, blank paper  Activity:  Back to Back Drawings.  Patients were seated in pairs back to back.  One patient would give directions and the other patient would listen.  The patients doing the talking were given a geometrical picture which they were to give instructions for their partner to draw.  The patients listening where to draw the pictures being described.  Patients would switch roles on the second round.  Education: Communication, Discharge Planning  Education Outcome: Acknowledges understanding/In group clarification offered/Needs additional education.   Clinical Observations/Feedback: Pt did not attend group.    [name], LRT/CTRS         [name] A 03/05/2017 11:24 AM

## 2017-03-05 NOTE — Progress Notes (Signed)
Recreation Therapy Notes  INPATIENT RECREATION TR PLAN  Patient Details Name: Tony Cunningham MRN: [id number] DOB: 07-Feb-1976 Today's Date: 03/05/2017  Rec Therapy Plan Is patient appropriate for Therapeutic Recreation?: Yes Treatment times per week: about 3 days Estimated Length of Stay: 5-7 days TR Treatment/Interventions: Group participation (Comment)  Discharge Criteria Pt will be discharged from therapy if:: Discharged Treatment plan/goals/alternatives discussed and agreed upon by:: Patient/family  Discharge Summary Short term goals set: Patient will attend and participate in Recreation Therapy Group Sessions  Short term goals met: Adequate for discharge Progress toward goals comments: Groups attended Which groups?: Other (Comment) (Team building) Reason goals not met: Pt only attended one group. Therapeutic equipment acquired: [name] Reason patient discharged from therapy: Discharge from hospital Pt/family agrees with progress & goals achieved: Yes Date patient discharged from therapy: 03/05/17   Victorino Sparrow, LRT/CTRS  Ria Comment, Shuntay Everetts A 03/05/2017, 11:47 AM

## 2017-03-05 NOTE — [facility] Group Notes (Signed)
LCSW Group Therapy Note   03/05/2017 1:15pm   Type of Therapy and Topic:  Group Therapy:  Positive Affirmations   Participation Level:  Did Not Attend  Description of Group: This group addressed positive affirmation toward self and others. Patients went around the room and identified two positive things about themselves and two positive things about a peer in the room. Patients reflected on how it felt to share something positive with others, to identify positive things about themselves, and to hear positive things from others. Patients were encouraged to have a daily reflection of positive characteristics or circumstances.  Therapeutic Goals 1. Patient will verbalize two of their positive qualities 2. Patient will demonstrate empathy for others by stating two positive qualities about a peer in the group 3. Patient will verbalize their feelings when voicing positive self affirmations and when voicing positive affirmations of others 4. Patients will discuss the potential positive impact on their wellness/recovery of focusing on positive traits of self and others. Summary of Patient Progress:    Therapeutic Modalities Cognitive Behavioral Therapy Motivational Interviewing  Carlynn Herald Work 03/05/2017 3:04 PM

## 2017-03-05 NOTE — Progress Notes (Signed)
[facility] Group Notes:  (Nursing/MHT/Case Management/Adjunct)  Date:  03/05/2017  Time:  9:24 PM  Type of Therapy:  Psychoeducational Skills  Participation Level:  Minimal  Participation Quality:  Attentive  Affect:  Flat  Cognitive:  Lacking  Insight:  Limited  Engagement in Group:  Limited  Modes of Intervention:  Education  Summary of Progress/Problems: Patient shared in group that he had a good day but did not go into further detail. His goal for tomorrow is to get more "rest".   Hazle Coca S 03/05/2017, 9:24 PM

## 2017-03-05 NOTE — Progress Notes (Signed)
D: Pt observed pacing the hallway for most of the evening. Pt at the time of assessment endorsed moderate anxiety and depression and kept asking the nurse if he was going to be fine in his new group home; "I hope I will be fine in the new place I'm going; I am very anxious about that" Pt denied AVH at the time. Pt also denied SI, HI or pain.  A: Medications offered as prescribed. Pt given the opportunity to ask questions and state concerns. Support, encouragement, and safe environment provided. R: Pt was med compliant. All patient's questions and concerns addressed. 15-minute safety checks continue. Safety checks continue. Pt attended wrap-up group.

## 2017-03-05 NOTE — Progress Notes (Signed)
[facility] MD Progress Note  03/05/2017 1:42 PM Tony Cunningham  MRN:  [id number]   Subjective:  Patient states " the lady told me she would pick me up Monday" ( referring to [place] Staff Member) . Expresses ongoing anxiety, apprehension about discharging from unit- at this time denies suicidal ideations. Denies medication side effects.   Objective: I have discussed case with treatment team and have met with patient. Tony Cunningham has improved partially compared to admission- he presents better groomed, with better eye contact and less paucity of speech. He reports ongoing depression but has acknowledged to some improvement and affect is less constricted, although still blunted .  He has been apprehensive about discharge, and as discharge date approaches, apprehension has increased . He responds partially to support and reassurance, and does state that he feels he will be "OK" in [place], and has told staff/CSW that he " liked the lady " ( [place] staff member who has visited him) . As discussed with staff and CSW Tony Cunningham, Group Home has confirmed that they will be picking him up Monday. Discharge had initially been scheduled for yesterday but was postponed by Group Home due to inclement weather, dangerous driving conditions. He denies medication side effects.  Principal Problem: Schizoaffective disorder, depressive type ([place]) Diagnosis:   Patient Active Problem List   Diagnosis Date Noted  . 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 Schizoaffective disorder, depressive type ([place]) [F25.1] 09/23/2016  . Polysubstance abuse ([place]) [F19.10] 07/17/2016  . Tardive dyskinesia [G24.01] 07/16/2016  . Tobacco use disorder [F17.200] 07/07/2016  . Dyslipidemia [E78.5] 07/07/2016  . Asthma [J45.909] 07/07/2016  . HTN (hypertension) [I10] 07/06/2016  . Diabetes ([place]) [E11.9] 12/25/2010   Total Time spent with patient: 20 minutes  Past Psychiatric History: See H&P  Past Medical  History:  Past Medical History:  Diagnosis Date  . Anxiety   . Asthma   . Diabetes mellitus   . High blood pressure   . Sinus complaint    History reviewed. No pertinent surgical history. Family History: History reviewed. No pertinent family history. Family Psychiatric  History: See H&P Social History:  History  Alcohol Use No     History  Drug Use No    Social History   Social History  . Marital status: Single    Spouse name: [name]  . Number of children: [name]  . Years of education: [name]   Social History Main Topics  . Smoking status: Current Every Day Smoker    Packs/day: 0.50    Types: Cigarettes  . Smokeless tobacco: Never Used  . Alcohol use No  . Drug use: No  . Sexual activity: Not Currently   Other Topics Concern  . None   Social History Narrative  . None   Additional Social History:    Pain Medications: See MAR Prescriptions: See MAR Over the Counter: See MAR History of alcohol / drug use?: No history of alcohol / drug abuse  Sleep: improved   Appetite:  improved   Current Medications: Current Facility-Administered Medications  Medication Dose Route Frequency Provider Last Rate Last Dose  . acetaminophen (TYLENOL) tablet 650 mg  650 mg Oral Q6H PRN Lindon Romp A, NP   650 mg at 02/19/17 2129  . alum & mag hydroxide-simeth (MAALOX/MYLANTA) 200-200-20 MG/5ML suspension 30 mL  30 mL Oral Q4H PRN Lindon Romp A, NP      . cloZAPine (CLOZARIL) tablet 150 mg  150 mg Oral QHS Rozetta Nunnery, NP  150 mg at 03/04/17 2141  . cloZAPine (CLOZARIL) tablet 50 mg  50 mg Oral Daily Hisada, Elie Goody, MD   50 mg at 03/05/17 0848  . FLUoxetine (PROZAC) capsule 40 mg  40 mg Oral Daily Lindon Romp A, NP   40 mg at 03/05/17 0848  . insulin aspart (novoLOG) injection 0-15 Units  0-15 Units Subcutaneous TID WC Ursula Alert, MD   11 Units at 03/04/17 1657  . insulin aspart (novoLOG) injection 0-5 Units  0-5 Units Subcutaneous QHS Ursula Alert, MD   3 Units at 03/02/17  2112  . insulin aspart (novoLOG) injection 5 Units  5 Units Subcutaneous TID WC Donne Hazel, MD   5 Units at 03/04/17 1810  . insulin glargine (LANTUS) injection 40 Units  40 Units Subcutaneous QHS Lindon Romp A, NP   40 Units at 03/04/17 2145  . lisinopril (PRINIVIL,ZESTRIL) tablet 20 mg  20 mg Oral Daily Lindon Romp A, NP   20 mg at 03/05/17 0849  . magnesium hydroxide (MILK OF MAGNESIA) suspension 30 mL  30 mL Oral Daily PRN Lindon Romp A, NP      . metFORMIN (GLUCOPHAGE) tablet 1,000 mg  1,000 mg Oral BID WC Lindon Romp A, NP   1,000 mg at 03/05/17 0848  . montelukast (SINGULAIR) tablet 10 mg  10 mg Oral QHS Lindon Romp A, NP   10 mg at 03/04/17 2143  . OLANZapine zydis (ZYPREXA) disintegrating tablet 5 mg  5 mg Oral TID PRN Ursula Alert, MD   5 mg at 02/19/17 2133   Or  . OLANZapine (ZYPREXA) injection 5 mg  5 mg Intramuscular TID PRN Eappen, Saramma, MD      . pantoprazole (PROTONIX) EC tablet 40 mg  40 mg Oral Daily Lindon Romp A, NP   40 mg at 03/05/17 0847  . propranolol (INDERAL) tablet 20 mg  20 mg Oral BID Lindon Romp A, NP   20 mg at 03/05/17 0848  . traZODone (DESYREL) tablet 50 mg  50 mg Oral QHS,MR X 1 Lindon Romp A, NP   50 mg at 03/04/17 2300    Lab Results:  Results for orders placed or performed during the hospital encounter of 02/14/17 (from the past 48 hour(s))  Glucose, capillary     Status: Abnormal   Collection Time: 03/03/17  4:50 PM  Result Value Ref Range   Glucose-Capillary 329 (H) 65 - 99 mg/dL   Comment 1 Notify RN    Comment 2 Document in Chart   Glucose, capillary     Status: Abnormal   Collection Time: 03/03/17  8:37 PM  Result Value Ref Range   Glucose-Capillary 198 (H) 65 - 99 mg/dL  Glucose, capillary     Status: Abnormal   Collection Time: 03/04/17  6:21 AM  Result Value Ref Range   Glucose-Capillary 154 (H) 65 - 99 mg/dL  Glucose, capillary     Status: Abnormal   Collection Time: 03/04/17 11:34 AM  Result Value Ref Range    Glucose-Capillary 57 (L) 65 - 99 mg/dL  Glucose, capillary     Status: None   Collection Time: 03/04/17 12:02 PM  Result Value Ref Range   Glucose-Capillary 68 65 - 99 mg/dL  Glucose, capillary     Status: Abnormal   Collection Time: 03/04/17  4:50 PM  Result Value Ref Range   Glucose-Capillary 304 (H) 65 - 99 mg/dL  Glucose, capillary     Status: Abnormal   Collection Time: 03/04/17  7:59 PM  Result Value Ref Range   Glucose-Capillary 161 (H) 65 - 99 mg/dL  Glucose, capillary     Status: Abnormal   Collection Time: 03/05/17  6:02 AM  Result Value Ref Range   Glucose-Capillary 50 (L) 65 - 99 mg/dL  Glucose, capillary     Status: None   Collection Time: 03/05/17  6:44 AM  Result Value Ref Range   Glucose-Capillary 91 65 - 99 mg/dL  Glucose, capillary     Status: Abnormal   Collection Time: 03/05/17 11:48 AM  Result Value Ref Range   Glucose-Capillary 184 (H) 65 - 99 mg/dL    Blood Alcohol level:  Lab Results  Component Value Date   ETH <5 01/16/2017   ETH <5 81/05/7508    Metabolic Disorder Labs: Lab Results  Component Value Date   HGBA1C 7.6 (H) 01/19/2017   MPG 171.42 01/19/2017   MPG 249 11/09/2016   Lab Results  Component Value Date   PROLACTIN 24.5 (H) 09/24/2016   PROLACTIN 3.4 (L) 07/07/2016   Lab Results  Component Value Date   CHOL 122 01/19/2017   TRIG 154 (H) 01/19/2017   HDL 36 (L) 01/19/2017   CHOLHDL 3.4 01/19/2017   VLDL 31 01/19/2017   LDLCALC 55 01/19/2017   LDLCALC 59 12/28/2016    Physical Findings: AIMS: Facial and Oral Movements Muscles of Facial Expression: None, normal Lips and Perioral Area: None, normal Jaw: None, normal Tongue: None, normal,Extremity Movements Upper (arms, wrists, hands, fingers): None, normal Lower (legs, knees, ankles, toes): None, normal, Trunk Movements Neck, shoulders, hips: None, normal, Overall Severity Severity of abnormal movements (highest score from questions above): None, normal Incapacitation due  to abnormal movements: None, normal Patient's awareness of abnormal movements (rate only patient's report): No Awareness, Dental Status Current problems with teeth and/or dentures?: No Does patient usually wear dentures?: No  CIWA:  CIWA-Ar Total: 1 COWS:  COWS Total Score: 1  Musculoskeletal: Strength & Muscle Tone: within normal limits Gait & Station: normal Patient leans: [name]  Psychiatric Specialty Exam: Physical Exam  Nursing note and vitals reviewed. Constitutional: He is oriented to person, place, and time. He appears well-developed and well-nourished.  Respiratory: Effort normal.  Musculoskeletal: Normal range of motion.  Neurological: He is alert and oriented to person, place, and time.  Skin: Skin is warm.    Review of Systems  Constitutional: Negative.   HENT: Negative.   Eyes: Negative.   Respiratory: Negative.   Cardiovascular: Negative.   Gastrointestinal: Negative.   Genitourinary: Negative.   Musculoskeletal: Negative.   Skin: Negative.   Neurological: Negative.   Endo/Heme/Allergies: Negative.   Psychiatric/Behavioral: Negative for hallucinations and suicidal ideas.    Blood pressure 132/85, pulse 72, temperature 98.3 F (36.8 C), temperature source Oral, resp. rate 16, height 6' (1.829 m), weight 76.7 kg (169 lb).Body mass index is 22.92 kg/m.  General Appearance: fairly groomed, improved compared to admission  Eye Contact:  Fair  Speech:  Normal Rate  Volume:  Decreased  Mood:  less depressed   Affect:  blunted, anxious about discharge  Thought Process:  Linear and Descriptions of Associations: Circumstantial  Orientation:  Other:  fully alert and attentive  Thought Content:  today denies hallucinations, and does not appear internally preoccupied   Suicidal Thoughts:  No- denies any active suicidal or self injurious ideations, denies homicidal ideations  Homicidal Thoughts:  No  Memory:  Recent and remote fair   Judgement:  Fair  Insight:  Fair   Psychomotor Activity:  decreased, but visible on unit, going to some groups, no psychomotor agitation  Concentration:  Concentration: improved and Attention Span: improved  Recall:  [employer] of Knowledge:  Fair  Language:  Fair  Akathisia:  No  Handed:  Right  AIMS (if indicated):     Assets:  Financial Resources/Insurance Housing Social Support  ADL's:  Intact  Cognition:  WNL  Sleep:  Number of Hours: 6.75   Assessment - patient has improved gradually and is improved  compared to admission presentation. Currently focusing on discharge. Discharge to a Group Home was scheduled for yesterday but is now postponed until Monday by [facility] due to recent inclement weather and road conditions. Patient anxious about discharge but stating he thinks [place] will be a good disposition plan for him. Currently denies SI.   Treatment Plan Summary: Treatment plan reviewed as below 10/12 Daily contact with patient to assess and evaluate symptoms and progress in treatment, Medication management and Plan is to:  -Continue Clozaril 50 mg PO Daily for mood stability -Continue Prozac 40 mg PO Daily for mood stability -Continue Clozaril 150 mg PO QHS for mood stability ( 10/9 WBC is 7.8 and ABN is 4.6)  -Continue Propranolol 20 mg PO BID  -Continue Trazodone 50 mg PO QHS PRN for insomnia -Encourage group therapy participation -Treatment Team working on disposition planning - see above    Jenne Campus, MD 03/05/2017, 1:42 PM   Patient ID: Tony Cunningham, male   DOB: [birth year], 41 y.o.   MRN: [id number]

## 2017-03-06 LAB — GLUCOSE, CAPILLARY
GLUCOSE-CAPILLARY: 184 mg/dL — AB (ref 65–99)
GLUCOSE-CAPILLARY: 240 mg/dL — AB (ref 65–99)
Glucose-Capillary: 76 mg/dL (ref 65–99)
Glucose-Capillary: 131 mg/dL — ABNORMAL HIGH (ref 65–99)

## 2017-03-06 NOTE — Progress Notes (Signed)
D: Pt observed pacing the hallway for most of the evening. Pt at the time of assessment endorsed moderate anxiety, depression and visual hallucinations; "I can see shadows, there are many of them; can't you see them too?"-Pt could still see the shadows in his dark room. Pt denied SI, HI or pain.  A: Medications offered as prescribed. Pt given the opportunity to ask questions and state concerns. Support, encouragement, and safe environment provided. R: Pt was med compliant. All patient's questions and concerns addressed. 15-minute safety checks continue. Safety checks continue. Pt did not attend wrap-up group.

## 2017-03-06 NOTE — Progress Notes (Signed)
[facility] MD Progress Note  03/06/2017 10:33 AM Tony Cunningham  MRN:  [id number]   Subjective:  Tony Cunningham reports "  I am okay, I am leaving Monday."   Objective: Tony Cunningham is awake, alert and oriented. Seen resting in bed. Patient reports taken medications as directed and states he is tolerating medications well. Patient reports a good appetite and states he is resting well during the night. Denies depression or depressive symptoms. Noted that patient disposition has changed/pending due to incomplete enrollment. Per staff patient needs support with management of diabetes and food choices during meal times. NP will f/u with CSW. Support, encouragement and reassurance was provided.   Principal Problem: Schizoaffective disorder, depressive type ([facility]) Diagnosis:   Patient Active Problem List   Diagnosis Date Noted  . 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 Schizoaffective disorder, depressive type ([facility]) [F25.1] 09/23/2016  . Polysubstance abuse ([facility]) [F19.10] 07/17/2016  . Tardive dyskinesia [G24.01] 07/16/2016  . Tobacco use disorder [F17.200] 07/07/2016  . Dyslipidemia [E78.5] 07/07/2016  . Asthma [J45.909] 07/07/2016  . HTN (hypertension) [I10] 07/06/2016  . Diabetes ([facility]) [E11.9] 12/25/2010   Total Time spent with patient: 20 minutes  Past Psychiatric History: See H&P  Past Medical History:  Past Medical History:  Diagnosis Date  . Anxiety   . Asthma   . Diabetes mellitus   . High blood pressure   . Sinus complaint    History reviewed. No pertinent surgical history. Family History: History reviewed. No pertinent family history. Family Psychiatric  History: See H&P Social History:  History  Alcohol Use No     History  Drug Use No    Social History   Social History  . Marital status: Single    Spouse name: [name]  . Number of children: [name]  . Years of education: [name]   Social History Main Topics  . Smoking status: Current Every Day Smoker    Packs/day:  0.50    Types: Cigarettes  . Smokeless tobacco: Never Used  . Alcohol use No  . Drug use: No  . Sexual activity: Not Currently   Other Topics Concern  . None   Social History Narrative  . None   Additional Social History:    Pain Medications: See MAR Prescriptions: See MAR Over the Counter: See MAR History of alcohol / drug use?: No history of alcohol / drug abuse  Sleep: improved   Appetite:  improved   Current Medications: Current Facility-Administered Medications  Medication Dose Route Frequency Provider Last Rate Last Dose  . acetaminophen (TYLENOL) tablet 650 mg  650 mg Oral Q6H PRN Nira Conn A, NP   650 mg at 02/19/17 2129  . alum & mag hydroxide-simeth (MAALOX/MYLANTA) 200-200-20 MG/5ML suspension 30 mL  30 mL Oral Q4H PRN Nira Conn A, NP      . cloZAPine (CLOZARIL) tablet 150 mg  150 mg Oral QHS Nira Conn A, NP   150 mg at 03/05/17 2122  . cloZAPine (CLOZARIL) tablet 50 mg  50 mg Oral Daily Neysa Hotter, MD   50 mg at 03/06/17 0958  . FLUoxetine (PROZAC) capsule 40 mg  40 mg Oral Daily Nira Conn A, NP   40 mg at 03/06/17 0959  . insulin aspart (novoLOG) injection 0-15 Units  0-15 Units Subcutaneous TID WC Jomarie Longs, MD   11 Units at 03/05/17 1804  . insulin aspart (novoLOG) injection 0-5 Units  0-5 Units Subcutaneous QHS Jomarie Longs, MD   2 Units at 03/05/17 2122  . insulin  aspart (novoLOG) injection 5 Units  5 Units Subcutaneous TID WC Jerald Kief, MD   5 Units at 03/05/17 1803  . insulin glargine (LANTUS) injection 40 Units  40 Units Subcutaneous QHS Nira Conn A, NP   40 Units at 03/05/17 2123  . lisinopril (PRINIVIL,ZESTRIL) tablet 20 mg  20 mg Oral Daily Nira Conn A, NP   20 mg at 03/06/17 1012  . magnesium hydroxide (MILK OF MAGNESIA) suspension 30 mL  30 mL Oral Daily PRN Nira Conn A, NP      . metFORMIN (GLUCOPHAGE) tablet 1,000 mg  1,000 mg Oral BID WC Nira Conn A, NP   1,000 mg at 03/06/17 1000  . montelukast (SINGULAIR)  tablet 10 mg  10 mg Oral QHS Nira Conn A, NP   10 mg at 03/05/17 2122  . OLANZapine zydis (ZYPREXA) disintegrating tablet 5 mg  5 mg Oral TID PRN Jomarie Longs, MD   5 mg at 02/19/17 2133   Or  . OLANZapine (ZYPREXA) injection 5 mg  5 mg Intramuscular TID PRN Eappen, Saramma, MD      . pantoprazole (PROTONIX) EC tablet 40 mg  40 mg Oral Daily Nira Conn A, NP   40 mg at 03/06/17 0955  . propranolol (INDERAL) tablet 20 mg  20 mg Oral BID Nira Conn A, NP   20 mg at 03/06/17 1011  . traZODone (DESYREL) tablet 50 mg  50 mg Oral QHS,MR X 1 Nira Conn A, NP   50 mg at 03/05/17 2200    Lab Results:  Results for orders placed or performed during the hospital encounter of 02/14/17 (from the past 48 hour(s))  Glucose, capillary     Status: Abnormal   Collection Time: 03/04/17 11:34 AM  Result Value Ref Range   Glucose-Capillary 57 (L) 65 - 99 mg/dL  Glucose, capillary     Status: None   Collection Time: 03/04/17 12:02 PM  Result Value Ref Range   Glucose-Capillary 68 65 - 99 mg/dL  Glucose, capillary     Status: Abnormal   Collection Time: 03/04/17  4:50 PM  Result Value Ref Range   Glucose-Capillary 304 (H) 65 - 99 mg/dL  Glucose, capillary     Status: Abnormal   Collection Time: 03/04/17  7:59 PM  Result Value Ref Range   Glucose-Capillary 161 (H) 65 - 99 mg/dL  Glucose, capillary     Status: Abnormal   Collection Time: 03/05/17  6:02 AM  Result Value Ref Range   Glucose-Capillary 50 (L) 65 - 99 mg/dL  Glucose, capillary     Status: None   Collection Time: 03/05/17  6:44 AM  Result Value Ref Range   Glucose-Capillary 91 65 - 99 mg/dL  Glucose, capillary     Status: Abnormal   Collection Time: 03/05/17 11:48 AM  Result Value Ref Range   Glucose-Capillary 184 (H) 65 - 99 mg/dL  Glucose, capillary     Status: Abnormal   Collection Time: 03/05/17  4:51 PM  Result Value Ref Range   Glucose-Capillary 315 (H) 65 - 99 mg/dL   Comment 1 Notify RN    Comment 2 Document in Chart    Glucose, capillary     Status: Abnormal   Collection Time: 03/05/17  8:23 PM  Result Value Ref Range   Glucose-Capillary 207 (H) 65 - 99 mg/dL   Comment 1 Notify RN   Glucose, capillary     Status: None   Collection Time: 03/06/17  5:40 AM  Result  Value Ref Range   Glucose-Capillary 76 65 - 99 mg/dL    Blood Alcohol level:  Lab Results  Component Value Date   ETH <5 01/16/2017   ETH <5 12/21/2016    Metabolic Disorder Labs: Lab Results  Component Value Date   HGBA1C 7.6 (H) 01/19/2017   MPG 171.42 01/19/2017   MPG 249 11/09/2016   Lab Results  Component Value Date   PROLACTIN 24.5 (H) 09/24/2016   PROLACTIN 3.4 (L) 07/07/2016   Lab Results  Component Value Date   CHOL 122 01/19/2017   TRIG 154 (H) 01/19/2017   HDL 36 (L) 01/19/2017   CHOLHDL 3.4 01/19/2017   VLDL 31 01/19/2017   LDLCALC 55 01/19/2017   LDLCALC 59 12/28/2016    Physical Findings: AIMS: Facial and Oral Movements Muscles of Facial Expression: None, normal Lips and Perioral Area: None, normal Jaw: None, normal Tongue: None, normal,Extremity Movements Upper (arms, wrists, hands, fingers): None, normal Lower (legs, knees, ankles, toes): None, normal, Trunk Movements Neck, shoulders, hips: None, normal, Overall Severity Severity of abnormal movements (highest score from questions above): None, normal Incapacitation due to abnormal movements: None, normal Patient's awareness of abnormal movements (rate only patient's report): No Awareness, Dental Status Current problems with teeth and/or dentures?: No Does patient usually wear dentures?: No  CIWA:  CIWA-Ar Total: 1 COWS:  COWS Total Score: 1  Musculoskeletal: Strength & Muscle Tone: within normal limits Gait & Station: normal Patient leans: [name]  Psychiatric Specialty Exam: Physical Exam  Nursing note and vitals reviewed. Constitutional: He is oriented to person, place, and time. He appears well-developed and well-nourished.  Respiratory:  Effort normal.  Musculoskeletal: Normal range of motion.  Neurological: He is alert and oriented to person, place, and time.  Skin: Skin is warm.    Review of Systems  Psychiatric/Behavioral: Negative for hallucinations and suicidal ideas.  All other systems reviewed and are negative.   Blood pressure (!) 132/101, pulse 69, temperature 97.7 F (36.5 C), resp. rate 20, height 6' (1.829 m), weight 76.7 kg (169 lb).Body mass index is 22.92 kg/m.  General Appearance: Disheveled slight improvement   Eye Contact:  Fair  Speech:  Normal Rate  Volume:  Decreased  Mood:  less depressed   Affect:  blunted, anxious regarding discharge  Thought Process:  Linear  Orientation:  Other:  fully alert and attentive  Thought Content:  today denies hallucinations, and does not appear internally preoccupied   Suicidal Thoughts:  No-   Homicidal Thoughts:  No  Memory:  Recent and remote fair   Judgement:  Fair  Insight:  Fair  Psychomotor Activity:  decreased, but visible on unit, going to some groups, no psychomotor agitation  Concentration:  Concentration: Fair  Recall:  [employer] of Knowledge:  Fair  Language:  Fair  Akathisia:  No  Handed:  Right  AIMS (if indicated):     Assets:  Financial Resources/Insurance Housing Social Support  ADL's:  Intact  Cognition:  WNL  Sleep:  Number of Hours: 5.75     I agree with current treatment plan on 03/06/2017, Patient seen face-to-face for psychiatric evaluation follow-up, chart reviewed. Reviewed the information documented and agree with the treatment plan.  Treatment Plan Summary: Treatment plan reviewed as below 10/13  Daily contact with patient to assess and evaluate symptoms and progress in treatment and Medication management    Noted per MD on 03/05/2017 Discharge to a Group Home was scheduled for yesterday but is now postponed until Monday  by [facility] due to recent inclement weather and road conditions  -Continue Clozaril 50 mg PO Daily for  mood stability -Continue Prozac 40 mg PO Daily for mood stability -Continue Clozaril 150 mg PO QHS for mood stability ( 10/9 WBC is 7.8 and ABN is 4.6)  -Continue Propranolol 20 mg PO BID  -Continue Trazodone 50 mg PO QHS PRN for insomnia -Encourage group therapy participation -Treatment Team working on disposition planning - see above    Oneta Rack, NP 03/06/2017, 10:33 AM

## 2017-03-06 NOTE — Progress Notes (Signed)
D. Pt presents with a flat affect and isolative behavior, resting in bed for most of the morning. Pt currently denies SI/HI and AVH and agrees to contact staff before acting on any harmful thoughts.  A. Labs and vitals monitored. . Pt compliant with meds. Support and encouragement given .  R. Pt remains safe with 15 minute checks. Will continue POC.

## 2017-03-06 NOTE — [facility] Group Notes (Signed)
[facility] Group Notes: (Clinical Social Work)   03/06/2017      Type of Therapy:  Group Therapy   Participation Level:  Did Not Attend despite MHT prompting   Ariz Terrones Grossman-Orr, LCSW 03/06/2017, 12:43 PM     

## 2017-03-06 NOTE — Progress Notes (Signed)
Psychoeducational Group Note  Date:  03/06/2017 Time:  2151  Group Topic/Focus:  Wrap-Up Group:   The focus of this group is to help patients review their daily goal of treatment and discuss progress on daily workbooks.  Participation Level: Did Not Attend  Participation Quality:  Not Applicable  Affect:  Not Applicable  Cognitive:  Not Applicable  Insight:  Not Applicable  Engagement in Group: Not Applicable  Additional Comments:  The patient did not attend group this evening.   Hazle Coca S 03/06/2017, 9:51 PM

## 2017-03-07 LAB — GLUCOSE, CAPILLARY
GLUCOSE-CAPILLARY: 346 mg/dL — AB (ref 65–99)
Glucose-Capillary: 121 mg/dL — ABNORMAL HIGH (ref 65–99)
Glucose-Capillary: 70 mg/dL (ref 65–99)
Glucose-Capillary: 343 mg/dL — ABNORMAL HIGH (ref 65–99)

## 2017-03-07 NOTE — BHH Counselor (Signed)
Clinical Social Work Note  Tried to call patient's mother with patient present yesterday, and he refused, stating that he had already talked to her and she said his debit card was at his last group home.  Therefore, called her alone today.  Spoke with patient's mother Tony Cunningham using [employer] [phone] to discuss getting patient's debit card for the group home to be able to purchase his medications.  She initially stated that she does not know if there is money in the account because she does not know if his disability has been cut off because of being in the hospital, and does not know if the hospital is being paid from his disability.  CSW assured her no money was being removed from the account by hospital, but she was not convinced.  She said she had received a phone call from the group home asking her to send a $980 money gram but could not do so because the patient is the only one who has access.  CSW informed her that the group home can only accept him Monday if they have his debit card to pay for his medications.  She talked initially about having no ride, but then identified her neighbor as a possible source of a ride, saying she would like to see him before he goes to the group home.  She also talked about bringing his medications in case they could be used, which CSW told her would be acceptable.  She was also informed that if this group placement falls through, he will have to be sent home to her, was informed he is stable for discharge and this plan must move forward immediately.  She said she could not take care of him unless he could return to his previous program that picked him up at 8am and kept him until 4pm (perhaps a psychosocial rehabilitation program, although she was not sure of the name).    After emphasizing the importance of her bringing the debit card today, mother agreed to call CSW and give up an update on her arrangements and time of arrival.  Now awaiting that  call.  Tony Mantle, LCSW 03/07/2017, 9:06 AM

## 2017-03-07 NOTE — Progress Notes (Signed)
D: Patient presents with anxious affect at nurse's station voicing concern about upcoming discharge. Pt states, "change is hard". Encouraged pt to voice concerns and offered reassurance. Pt denies SI/HI and A/V hallucinations at this time. Pt's mother to visit at 1400 and to bring clothes/debit card  A: Pt compliant with meds. NP notified of pt's CBG result of 70.   R: Pt remains safe on unit with 15 minute checks. Pt currently in dayroom interacting with staff

## 2017-03-07 NOTE — Progress Notes (Signed)
[facility] MD Progress Note  03/07/2017 9:32 AM Tony Cunningham  MRN:  [id number]   Subjective:  Tony Cunningham reports " I am okay"  Objective: Tony Cunningham seen resting in bed. Continues to isolate,however will attend group session with much encouragement. Medication compliant and tolerating medications well. Patient reports he was able to see his family during visitation and reports the visit went well on Tuesday.- Noted that patient disposition has changed/pending due to incomplete enrollment and inclement weather. NP will f/u with CSW. Support, encouragement and reassurance was provided.   Principal Problem: Schizoaffective disorder, depressive type (HCC) Diagnosis:   Patient Active Problem List   Diagnosis Date Noted  . 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 Schizoaffective disorder, depressive type (HCC) [F25.1] 09/23/2016  . Polysubstance abuse (HCC) [F19.10] 07/17/2016  . Tardive dyskinesia [G24.01] 07/16/2016  . Tobacco use disorder [F17.200] 07/07/2016  . Dyslipidemia [E78.5] 07/07/2016  . Asthma [J45.909] 07/07/2016  . HTN (hypertension) [I10] 07/06/2016  . Diabetes (HCC) [E11.9] 12/25/2010   Total Time spent with patient: 20 minutes  Past Psychiatric History: See H&P  Past Medical History:  Past Medical History:  Diagnosis Date  . Anxiety   . Asthma   . Diabetes mellitus   . High blood pressure   . Sinus complaint    History reviewed. No pertinent surgical history. Family History: History reviewed. No pertinent family history. Family Psychiatric  History: See H&P Social History:  History  Alcohol Use No     History  Drug Use No    Social History   Social History  . Marital status: Single    Spouse name: [name]  . Number of children: [name]  . Years of education: [name]   Social History Main Topics  . Smoking status: Current Every Day Smoker    Packs/day: 0.50    Types: Cigarettes  . Smokeless tobacco: Never Used  . Alcohol use No  . Drug use: No  .  Sexual activity: Not Currently   Other Topics Concern  . None   Social History Narrative  . None   Additional Social History:    Pain Medications: See MAR Prescriptions: See MAR Over the Counter: See MAR History of alcohol / drug use?: No history of alcohol / drug abuse  Sleep: improved   Appetite:  improved   Current Medications: Current Facility-Administered Medications  Medication Dose Route Frequency Provider Last Rate Last Dose  . acetaminophen (TYLENOL) tablet 650 mg  650 mg Oral Q6H PRN Nira Conn A, NP   650 mg at 02/19/17 2129  . alum & mag hydroxide-simeth (MAALOX/MYLANTA) 200-200-20 MG/5ML suspension 30 mL  30 mL Oral Q4H PRN Nira Conn A, NP      . cloZAPine (CLOZARIL) tablet 150 mg  150 mg Oral QHS Nira Conn A, NP   150 mg at 03/06/17 2020  . cloZAPine (CLOZARIL) tablet 50 mg  50 mg Oral Daily Neysa Hotter, MD   50 mg at 03/07/17 0837  . FLUoxetine (PROZAC) capsule 40 mg  40 mg Oral Daily Nira Conn A, NP   40 mg at 03/07/17 0839  . insulin aspart (novoLOG) injection 0-15 Units  0-15 Units Subcutaneous TID WC Jomarie Longs, MD   2 Units at 03/07/17 0823  . insulin aspart (novoLOG) injection 0-5 Units  0-5 Units Subcutaneous QHS Jomarie Longs, MD   2 Units at 03/06/17 2027  . insulin aspart (novoLOG) injection 5 Units  5 Units Subcutaneous TID WC Jerald Kief, MD   5 Units at  03/06/17 1713  . insulin glargine (LANTUS) injection 40 Units  40 Units Subcutaneous QHS Nira Conn A, NP   40 Units at 03/06/17 2027  . lisinopril (PRINIVIL,ZESTRIL) tablet 20 mg  20 mg Oral Daily Nira Conn A, NP   20 mg at 03/07/17 0838  . magnesium hydroxide (MILK OF MAGNESIA) suspension 30 mL  30 mL Oral Daily PRN Nira Conn A, NP      . metFORMIN (GLUCOPHAGE) tablet 1,000 mg  1,000 mg Oral BID WC Nira Conn A, NP   1,000 mg at 03/07/17 0840  . montelukast (SINGULAIR) tablet 10 mg  10 mg Oral QHS Nira Conn A, NP   10 mg at 03/06/17 2020  . OLANZapine zydis (ZYPREXA)  disintegrating tablet 5 mg  5 mg Oral TID PRN Jomarie Longs, MD   5 mg at 02/19/17 2133   Or  . OLANZapine (ZYPREXA) injection 5 mg  5 mg Intramuscular TID PRN Eappen, Saramma, MD      . pantoprazole (PROTONIX) EC tablet 40 mg  40 mg Oral Daily Nira Conn A, NP   40 mg at 03/07/17 0840  . propranolol (INDERAL) tablet 20 mg  20 mg Oral BID Nira Conn A, NP   20 mg at 03/07/17 0839  . traZODone (DESYREL) tablet 50 mg  50 mg Oral QHS,MR X 1 Nira Conn A, NP   50 mg at 03/06/17 2152    Lab Results:  Results for orders placed or performed during the hospital encounter of 02/14/17 (from the past 48 hour(s))  Glucose, capillary     Status: Abnormal   Collection Time: 03/05/17 11:48 AM  Result Value Ref Range   Glucose-Capillary 184 (H) 65 - 99 mg/dL  Glucose, capillary     Status: Abnormal   Collection Time: 03/05/17  4:51 PM  Result Value Ref Range   Glucose-Capillary 315 (H) 65 - 99 mg/dL   Comment 1 Notify RN    Comment 2 Document in Chart   Glucose, capillary     Status: Abnormal   Collection Time: 03/05/17  8:23 PM  Result Value Ref Range   Glucose-Capillary 207 (H) 65 - 99 mg/dL   Comment 1 Notify RN   Glucose, capillary     Status: None   Collection Time: 03/06/17  5:40 AM  Result Value Ref Range   Glucose-Capillary 76 65 - 99 mg/dL  Glucose, capillary     Status: Abnormal   Collection Time: 03/06/17 11:46 AM  Result Value Ref Range   Glucose-Capillary 131 (H) 65 - 99 mg/dL   Comment 1 Notify RN    Comment 2 Document in Chart   Glucose, capillary     Status: Abnormal   Collection Time: 03/06/17  4:37 PM  Result Value Ref Range   Glucose-Capillary 184 (H) 65 - 99 mg/dL   Comment 1 Notify RN    Comment 2 Document in Chart   Glucose, capillary     Status: Abnormal   Collection Time: 03/06/17  8:17 PM  Result Value Ref Range   Glucose-Capillary 240 (H) 65 - 99 mg/dL  Glucose, capillary     Status: Abnormal   Collection Time: 03/07/17  5:54 AM  Result Value Ref Range    Glucose-Capillary 121 (H) 65 - 99 mg/dL    Blood Alcohol level:  Lab Results  Component Value Date   ETH <5 01/16/2017   ETH <5 12/21/2016    Metabolic Disorder Labs: Lab Results  Component Value Date   HGBA1C  7.6 (H) 01/19/2017   MPG 171.42 01/19/2017   MPG 249 11/09/2016   Lab Results  Component Value Date   PROLACTIN 24.5 (H) 09/24/2016   PROLACTIN 3.4 (L) 07/07/2016   Lab Results  Component Value Date   CHOL 122 01/19/2017   TRIG 154 (H) 01/19/2017   HDL 36 (L) 01/19/2017   CHOLHDL 3.4 01/19/2017   VLDL 31 01/19/2017   LDLCALC 55 01/19/2017   LDLCALC 59 12/28/2016    Physical Findings: AIMS: Facial and Oral Movements Muscles of Facial Expression: None, normal Lips and Perioral Area: None, normal Jaw: None, normal Tongue: None, normal,Extremity Movements Upper (arms, wrists, hands, fingers): None, normal Lower (legs, knees, ankles, toes): None, normal, Trunk Movements Neck, shoulders, hips: None, normal, Overall Severity Severity of abnormal movements (highest score from questions above): None, normal Incapacitation due to abnormal movements: None, normal Patient's awareness of abnormal movements (rate only patient's report): No Awareness, Dental Status Current problems with teeth and/or dentures?: No Does patient usually wear dentures?: No  CIWA:  CIWA-Ar Total: 1 COWS:  COWS Total Score: 1  Musculoskeletal: Strength & Muscle Tone: within normal limits Gait & Station: normal Patient leans: [name]  Psychiatric Specialty Exam: Physical Exam  Nursing note and vitals reviewed. Constitutional: He is oriented to person, place, and time. He appears well-developed and well-nourished.  Respiratory: Effort normal.  Musculoskeletal: Normal range of motion.  Neurological: He is alert and oriented to person, place, and time.  Skin: Skin is warm.    Review of Systems  Psychiatric/Behavioral: Negative for hallucinations and suicidal ideas.  All other systems  reviewed and are negative.   Blood pressure 120/65, pulse 98, temperature 98.5 F (36.9 C), temperature source Oral, resp. rate 16, height 6' (1.829 m), weight 76.7 kg (169 lb).Body mass index is 22.92 kg/m.  General Appearance: Disheveled slight improvement   Eye Contact:  Fair  Speech:  Normal Rate  Volume:  Decreased  Mood:  improving   Affect:  blunted, anxious regarding discharge  Thought Process:  Linear  Orientation:  Other:  fully alert and attentive  Thought Content:  today denies hallucinations, and does not appear internally preoccupied   Suicidal Thoughts:  No-   Homicidal Thoughts:  No  Memory:  Recent and remote fair   Judgement:  Fair  Insight:  Fair  Psychomotor Activity:  decreased, but visible on unit, going to some groups, no psychomotor agitation  Concentration:  Concentration: Fair  Recall:  [employer] of Knowledge:  Fair  Language:  Fair  Akathisia:  No  Handed:  Right  AIMS (if indicated):     Assets:  Financial Resources/Insurance Housing Social Support  ADL's:  Intact  Cognition:  WNL  Sleep:  Number of Hours: 5.75     I agree with current treatment plan on 03/07/2017, Patient seen face-to-face for psychiatric evaluation follow-up, chart reviewed. Reviewed the information documented and agree with the treatment plan.  Treatment Plan Summary: Treatment plan reviewed as below 10/14  Daily contact with patient to assess and evaluate symptoms and progress in treatment and Medication management    Noted per MD on 03/05/2017 Discharge to a Group Home was scheduled for yesterday but is now postponed until Monday by [facility] due to recent inclement weather and road conditions  -Continue Clozaril 50 mg PO Daily for mood stability -Continue Prozac 40 mg PO Daily for mood stability -Continue Clozaril 150 mg PO QHS for mood stability ( 10/9 WBC is 7.8 and ABN is 4.6)  -  Continue Propranolol 20 mg PO BID  -Continue Trazodone 50 mg PO QHS PRN for  insomnia -Encourage group therapy participation -Treatment Team working on disposition planning - see above    Oneta Rack, NP 03/07/2017, 9:32 AM

## 2017-03-07 NOTE — BHH Counselor (Signed)
Clinical Social Work Note  Met with mother, interpreter, and patient.  Debit card and clothing to be secured by RN in patient's locker and given to him when he leave for group home tomorrow.  Mother started asking him if he would rather go to the group home or back to her home, and CSW interjected that he needed to try this different plan first and go to the group home for perhaps a month or two.  CSW informed both that services have not been put in place for him to go home, reminded them that the last time he went home he did not feel safe and left within 2 days to return to hospital.  Patient kept saying he does not know if he can "do it" and CSW provided encouragement with motivational interviewing.  He was tearful, but then stopped crying and said he is going to give it a try, said he really likes the group home owner.    Selmer Dominion, LCSW 03/07/2017, 3:06 PM

## 2017-03-07 NOTE — BHH Group Notes (Signed)
[facility] LCSW Group Therapy Note  Date/Time:  03/07/2017  11:00AM-12:00PM  Type of Therapy and Topic:  Group Therapy:  Music and Mood  Participation Level:  Did Not Attend   Description of Group: In this process group, members listened to a variety of genres of music and identified that different types of music evoke different responses.  Patients were encouraged to identify music that was soothing for them and music that was energizing for them.  Patients discussed how this knowledge can help with wellness and recovery in various ways including managing depression and anxiety as well as encouraging healthy sleep habits.    Therapeutic Goals: 1. Patients will explore the impact of different varieties of music on mood 2. Patients will verbalize the thoughts they have when listening to different types of music 3. Patients will identify music that is soothing to them as well as music that is energizing to them 4. Patients will discuss how to use this knowledge to assist in maintaining wellness and recovery 5. Patients will explore the use of music as a coping skill  Summary of Patient Progress:  [name]  Therapeutic Modalities: Solution Focused Brief Therapy Motivational Interviewing Activity   Ambrose Mantle, LCSW 03/07/2017 8:26 AM

## 2017-03-08 LAB — GLUCOSE, CAPILLARY
GLUCOSE-CAPILLARY: 137 mg/dL — AB (ref 65–99)
Glucose-Capillary: 112 mg/dL — ABNORMAL HIGH (ref 65–99)
Glucose-Capillary: 67 mg/dL (ref 65–99)

## 2017-03-08 MED ORDER — LORAZEPAM 1 MG PO TABS
1.0000 mg | ORAL_TABLET | Freq: Once | ORAL | Status: AC
  Administered 2017-03-08: 1 mg via ORAL
  Filled 2017-03-08: qty 1

## 2017-03-08 NOTE — BHH Suicide Risk Assessment (Signed)
[facility] Discharge Suicide Risk Assessment   Principal Problem: Schizoaffective disorder, depressive type [facility]) Discharge Diagnoses:  Patient Active Problem List   Diagnosis Date Noted  . 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 Schizoaffective disorder, depressive type (HCC) [F25.1] 09/23/2016  . Polysubstance abuse (HCC) [F19.10] 07/17/2016  . Tardive dyskinesia [G24.01] 07/16/2016  . Tobacco use disorder [F17.200] 07/07/2016  . Dyslipidemia [E78.5] 07/07/2016  . Asthma [J45.909] 07/07/2016  . HTN (hypertension) [I10] 07/06/2016  . Diabetes (HCC) [E11.9] 12/25/2010    Total Time spent with patient: 30 minutes  Musculoskeletal: Strength & Muscle Tone: within normal limits Gait & Station: normal Patient leans: [name]  Psychiatric Specialty Exam: ROS denies headache, no chest pain, reports nausea, no vomiting, no fever   Blood pressure (!) 150/80, pulse 79, temperature 98.5 F (36.9 C), resp. rate 18, height 6' (1.829 m), weight 76.7 kg (169 lb).Body mass index is 22.92 kg/m.  General Appearance: Fairly Groomed  Patent attorney::  Fair  Speech:  decreased 409  Volume:  Decreased  Mood:  reports he is feeling " anxious" , but does endorse improvement compared to admission  Affect:  improving, less flat, less blunted, remains anxioius   Thought Process:  Linear and Descriptions of Associations: Circumstantial  Orientation:  Other:  fully alert and attentive  Thought Content:  describes intermittent hallucinations, does not appear internally preoccupied at this time, no delusions are expressed   Suicidal Thoughts:  No at this time denies suicidal or self injurious ideations, denies homicidal or violent ideations   Homicidal Thoughts:  No  Memory:  recent and remote grossly intact   Judgement:  Fair- improved compared to admission  Insight:  Fair  Psychomotor Activity:  Decreased  Concentration:  Fair  Recall:  [employer] of Knowledge:Fair  Language: Good   Akathisia:  Yes  Handed:  Right  AIMS (if indicated):     Assets:  Desire for Improvement Resilience  Sleep:  Number of Hours: 5.75  Cognition: WNL  ADL's:  Intact   Mental Status Per Nursing Assessment::   On Admission:     Demographic Factors:  41 year old single male  Loss Factors: Chronic mental illness, multiple psychiatric  admissions  Historical Factors: History of chronic psychiatric illness , has been diagnosed with Schizoaffective Disorder in the past, history of multiple psychiatric admissions   Risk Reduction Factors:   resilience   Continued Clinical Symptoms:  At this time patient is partially improved compared to admission presentation- mood is partially improved, affect remains anxious, vaguely constricted, reports persistent but improving hallucinations . At this time does not appear internally preoccupied and does not express delusions . Denies suicidal ideations at this time, denies homicidal or violent ideations. No disruptive or agitated behaviors on unit. At present does not endorse medication side effects.  Cognitive Features That Contribute To Risk:  No gross cognitive deficits noted upon discharge. Is alert , attentive, and oriented x 3   Suicide Risk:  Mild:  Suicidal ideation of limited frequency, intensity, duration, and specificity.  There are no identifiable plans, no associated intent, mild dysphoria and related symptoms, good self-control (both objective and subjective assessment), few other risk factors, and identifiable protective factors, including available and accessible social support.  Follow-up Information    [facility] Follow up on 03/17/2017.   Why:  Wednesday at12:45 to fill out paperwork for a 1:20 appointment.  Bring along copy of insurance cards Contact information: [address] [place]  [[phone]] [phone]  Plan Of Care/Follow-up recommendations:  Activity:  as tolerated  Diet:  diabetic diet  Tests:  NA Other:   see below  Patient is being discharged to Group Home Setting Follow up as above   Craige Cotta, MD 03/08/2017, 1:01 PM

## 2017-03-08 NOTE — Progress Notes (Signed)
Per Hillery Jacks, NP., verbal order given to order Ativan 1 mg po once at noontime for increased anxiety.

## 2017-03-08 NOTE — Discharge Instructions (Signed)
Patient to follow-up with PCP for medication maintenance and Lab follow-up, consider CBC and CMP

## 2017-03-08 NOTE — [place] FL2 (Signed)
[place] MEDICAID FL2 LEVEL OF CARE SCREENING TOOL     IDENTIFICATION  Patient Name: Tony Cunningham Birthdate: 31-Jan-1976 Sex: male Admission Date (Current Location): 02/14/2017  [place] and [place] Number:  [name] [id number] [name] MCD; Medicare [id number] Facility and Address:   [facility], [address], [place]  [address])      Provider Number:    Attending Physician Name and Address:  Jomarie Longs, MD  Relative Name and Phone Number:  [name], mother.  [phone]    Current Level of Care: Hospital Recommended Level of Care: [facility] Prior Approval Number:    Date Approved/Denied:   PASRR Number: [id number]  Discharge Plan: Domiciliary (Rest home)    Current Diagnoses: Patient Active Problem List   Diagnosis Date Noted  . 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 Schizoaffective disorder, depressive type (HCC) 09/23/2016  . Polysubstance abuse (HCC) 07/17/2016  . Tardive dyskinesia 07/16/2016  . Tobacco use disorder 07/07/2016  . Dyslipidemia 07/07/2016  . Asthma 07/07/2016  . HTN (hypertension) 07/06/2016  . Diabetes (HCC) 12/25/2010    Orientation RESPIRATION BLADDER Height & Weight     Self, Time, Situation, Place  Normal Continent Weight: 169 lb (76.7 kg) Height:  6' (182.9 cm)  BEHAVIORAL SYMPTOMS/MOOD NEUROLOGICAL BOWEL NUTRITION STATUS     (none) Continent Diet (Diabetic diet)  AMBULATORY STATUS COMMUNICATION OF NEEDS Skin   Independent Verbally Normal                       Personal Care Assistance Level of Assistance  Bathing, Feeding, Dressing Bathing Assistance: Limited assistance Feeding assistance: Independent Dressing Assistance: Limited assistance     Functional Limitations Info  Sight, Hearing, Speech Sight Info: Adequate Hearing Info: Adequate Speech Info: Adequate    SPECIAL CARE FACTORS FREQUENCY  Diabetic urine testing Blood Pressure Frequency: has  hypertension;  Diabetic Urine Testing Frequency: Sliding scale insulin required, see MAR                Contractures Contractures Info: Not present    Additional Factors Info  Psychotropic, Allergies   Allergies Info: Seroquel Psychotropic Info: See MAR         Current Medications (03/08/2017):  This is the current hospital active medication list Current Facility-Administered Medications  Medication Dose Route Frequency Provider Last Rate Last Dose  . acetaminophen (TYLENOL) tablet 650 mg  650 mg Oral Q6H PRN Nira Conn A, NP   650 mg at 02/19/17 2129  . alum & mag hydroxide-simeth (MAALOX/MYLANTA) 200-200-20 MG/5ML suspension 30 mL  30 mL Oral Q4H PRN Nira Conn A, NP      . cloZAPine (CLOZARIL) tablet 150 mg  150 mg Oral QHS Nira Conn A, NP   150 mg at 03/07/17 2119  . cloZAPine (CLOZARIL) tablet 50 mg  50 mg Oral Daily Neysa Hotter, MD   50 mg at 03/08/17 0300  . FLUoxetine (PROZAC) capsule 40 mg  40 mg Oral Daily Nira Conn A, NP   40 mg at 03/08/17 0838  . insulin aspart (novoLOG) injection 0-15 Units  0-15 Units Subcutaneous TID WC Jomarie Longs, MD   11 Units at 03/07/17 1717  . insulin aspart (novoLOG) injection 0-5 Units  0-5 Units Subcutaneous QHS Jomarie Longs, MD   4 Units at 03/07/17 2148  . insulin aspart (novoLOG) injection 5 Units  5 Units Subcutaneous TID WC Jerald Kief, MD   5 Units at 03/07/17 1717  . insulin glargine (LANTUS) injection  40 Units  40 Units Subcutaneous QHS Nira Conn A, NP   40 Units at 03/07/17 2148  . lisinopril (PRINIVIL,ZESTRIL) tablet 20 mg  20 mg Oral Daily Nira Conn A, NP   20 mg at 03/08/17 0838  . magnesium hydroxide (MILK OF MAGNESIA) suspension 30 mL  30 mL Oral Daily PRN Nira Conn A, NP      . metFORMIN (GLUCOPHAGE) tablet 1,000 mg  1,000 mg Oral BID WC Nira Conn A, NP   1,000 mg at 03/08/17 7282  . montelukast (SINGULAIR) tablet 10 mg  10 mg Oral QHS Nira Conn A, NP   10 mg at 03/07/17 2119  .  OLANZapine zydis (ZYPREXA) disintegrating tablet 5 mg  5 mg Oral TID PRN Jomarie Longs, MD   5 mg at 02/19/17 2133   Or  . OLANZapine (ZYPREXA) injection 5 mg  5 mg Intramuscular TID PRN Eappen, Saramma, MD      . pantoprazole (PROTONIX) EC tablet 40 mg  40 mg Oral Daily Nira Conn A, NP   40 mg at 03/08/17 0838  . propranolol (INDERAL) tablet 20 mg  20 mg Oral BID Nira Conn A, NP   20 mg at 03/08/17 0838  . traZODone (DESYREL) tablet 50 mg  50 mg Oral QHS,MR X 1 Nira Conn A, NP   50 mg at 03/07/17 2227     Discharge Medications: Please see discharge summary for a list of discharge medications.  Relevant Imaging Results:  Relevant Lab Results:   Additional Information Patient also has asthma and is on psychotropics. SSN [id number]  Sallee Lange, LCSW

## 2017-03-08 NOTE — Progress Notes (Signed)
Hypoglycemic Event  CBG: 67  Treatment: 15 GM carbohydrate snack  Symptoms: None  Follow-up CBG: Time:11:50 am. CBG Result:112  Possible Reasons for Event: Inadequate meal intake  Comments/MD notified:Travis, Np.    Tony Cunningham

## 2017-03-08 NOTE — Progress Notes (Signed)
Discharge note: Pt received both written and verbal discharge instructions. Pt verbalized understanding of d/c instructions. Pt agreed to f/u appt and med regimen. Pt received prescriptions,  Sample meds, SRA, AVS and transitional record. Pt gathered belonging from room and locker. Pt meds and d/c packet given to Ms. Lelon Perla.

## 2017-03-08 NOTE — Progress Notes (Signed)
Patient ID: Tony Cunningham, male   DOB: Oct 25, 1975, 41 y.o.   MRN: [id number] PER STATE REGULATIONS 482.30  THIS CHART WAS REVIEWED FOR MEDICAL NECESSITY WITH RESPECT TO THE PATIENT'S ADMISSION/ DURATION OF STAY.  NEXT REVIEW DATE: 03/12/2017  Willa Rough, RN, BSN CASE MANAGER

## 2017-03-08 NOTE — Progress Notes (Signed)
D: Pt at the time of assessment was flat, isolative and withdrawn to self even when in the dayroom. Pt at the time endorsed moderate depression and severe anxiety; states, "I can't just get my mind straight; I am anxious about going the group home; I don't think I'm ready." Pt denied SI, HI or AVH. Pt was slow to answer questions.  A: Medications offered as prescribed. All patient's questions and concerns addressed. Support, encouragement, and safe environment provided. 15-minute safety checks continue. R: Pt was med compliant. Pt attended wrap-up group. Safety checks continue.

## 2017-03-08 NOTE — Progress Notes (Signed)
  [facility] Adult Case Management Discharge Plan :  Will you be returning to the same living situation after discharge:  No, will admit to [facility] At discharge, do you have transportation home?: Yes,  [facility] staff, Ms Amie Critchley you have the ability to pay for your medications: Yes,  insured  Release of information consent forms completed and in the chart;  Patient's signature needed at discharge.  Patient to Follow up at: Follow-up Information    [facility] Follow up on 03/17/2017.   Why:  Wednesday at12:45 to fill out paperwork for a 1:20 appointment.  Bring along copy of insurance cards Contact information: [address] [place] Phone [phone] Fax  [phone]          Next level of care provider has access to [facility] Link:no  Safety Planning and Suicide Prevention discussed: [facility] staff, Ms Lelon Perla  Have you used any form of tobacco in the last 30 days? (Cigarettes, Smokeless Tobacco, Cigars, and/or Pipes): Yes  Has patient been referred to the Quitline?: Patient refused referral  Patient has been referred for addiction treatment: Yes  Sallee Lange, LCSW 03/08/2017, 2:19 PM

## 2017-03-08 NOTE — Tx Team (Signed)
Interdisciplinary Treatment and Diagnostic Plan Update  03/08/2017 Time of Session: 9:00 AM Tony Cunningham MRN: [id number]  Principal Diagnosis: Schizoaffective disorder, depressive type (HCC)  Secondary Diagnoses: Principal Problem:   Schizoaffective disorder, depressive type (HCC) Active Problems:   Diabetes (HCC)   Neuroleptic-induced Parkinsonism (HCC)   Current Medications:  Current Facility-Administered Medications  Medication Dose Route Frequency Provider Last Rate Last Dose  . acetaminophen (TYLENOL) tablet 650 mg  650 mg Oral Q6H PRN Nira Conn A, NP   650 mg at 02/19/17 2129  . alum & mag hydroxide-simeth (MAALOX/MYLANTA) 200-200-20 MG/5ML suspension 30 mL  30 mL Oral Q4H PRN Nira Conn A, NP      . cloZAPine (CLOZARIL) tablet 150 mg  150 mg Oral QHS Nira Conn A, NP   150 mg at 03/07/17 2119  . cloZAPine (CLOZARIL) tablet 50 mg  50 mg Oral Daily Neysa Hotter, MD   50 mg at 03/08/17 7579  . FLUoxetine (PROZAC) capsule 40 mg  40 mg Oral Daily Nira Conn A, NP   40 mg at 03/08/17 0838  . insulin aspart (novoLOG) injection 0-15 Units  0-15 Units Subcutaneous TID WC Jomarie Longs, MD   11 Units at 03/07/17 1717  . insulin aspart (novoLOG) injection 0-5 Units  0-5 Units Subcutaneous QHS Jomarie Longs, MD   4 Units at 03/07/17 2148  . insulin aspart (novoLOG) injection 5 Units  5 Units Subcutaneous TID WC Jerald Kief, MD   5 Units at 03/07/17 1717  . insulin glargine (LANTUS) injection 40 Units  40 Units Subcutaneous QHS Nira Conn A, NP   40 Units at 03/07/17 2148  . lisinopril (PRINIVIL,ZESTRIL) tablet 20 mg  20 mg Oral Daily Nira Conn A, NP   20 mg at 03/08/17 0838  . LORazepam (ATIVAN) tablet 1 mg  1 mg Oral Once Oneta Rack, NP      . magnesium hydroxide (MILK OF MAGNESIA) suspension 30 mL  30 mL Oral Daily PRN Nira Conn A, NP      . metFORMIN (GLUCOPHAGE) tablet 1,000 mg  1,000 mg Oral BID WC Nira Conn A, NP   1,000 mg at 03/08/17 7282  .  montelukast (SINGULAIR) tablet 10 mg  10 mg Oral QHS Nira Conn A, NP   10 mg at 03/07/17 2119  . OLANZapine zydis (ZYPREXA) disintegrating tablet 5 mg  5 mg Oral TID PRN Jomarie Longs, MD   5 mg at 02/19/17 2133   Or  . OLANZapine (ZYPREXA) injection 5 mg  5 mg Intramuscular TID PRN Eappen, Saramma, MD      . pantoprazole (PROTONIX) EC tablet 40 mg  40 mg Oral Daily Nira Conn A, NP   40 mg at 03/08/17 0838  . propranolol (INDERAL) tablet 20 mg  20 mg Oral BID Nira Conn A, NP   20 mg at 03/08/17 0838  . traZODone (DESYREL) tablet 50 mg  50 mg Oral QHS,MR X 1 Nira Conn A, NP   50 mg at 03/07/17 2227    PTA Medications: Prescriptions Prior to Admission  Medication Sig Dispense Refill Last Dose  . amantadine (SYMMETREL) 100 MG capsule Take 100 mg by mouth 2 (two) times daily.   02/11/2017  . citalopram (CELEXA) 20 MG tablet Take 20 mg by mouth daily.   02/11/2017  . cloZAPine (CLOZARIL) 50 MG tablet Take 3 tablets (150 mg total) by mouth at bedtime. 90 tablet 1 02/11/2017  . FLUoxetine (PROZAC) 40 MG capsule Take 1 capsule (40 mg total)  by mouth daily. 30 capsule 1 02/11/2017  . insulin glargine (LANTUS) 100 UNIT/ML injection Inject 0.4 mLs (40 Units total) into the skin at bedtime. (Patient taking differently: Inject 45 Units into the skin at bedtime. ) 10 mL 2 02/11/2017  . lisinopril (PRINIVIL,ZESTRIL) 20 MG tablet Take 1 tablet (20 mg total) by mouth daily. 30 tablet 1 02/11/2017  . metFORMIN (GLUCOPHAGE) 1000 MG tablet Take 1 tablet (1,000 mg total) by mouth 2 (two) times daily with a meal. 60 tablet 1 02/11/2017  . pantoprazole (PROTONIX) 40 MG tablet Take 1 tablet (40 mg total) by mouth daily. 30 tablet 1 02/11/2017  . propranolol (INDERAL) 20 MG tablet Take 1 tablet (20 mg total) by mouth 2 (two) times daily. 60 tablet 1 02/11/2017  . simvastatin (ZOCOR) 20 MG tablet Take 20 mg by mouth daily.   02/11/2017  . temazepam (RESTORIL) 7.5 MG capsule Take 7.5 mg by mouth at bedtime.    02/11/2017  . [DISCONTINUED] insulin aspart (NOVOLOG) 100 UNIT/ML injection Inject 5 Units into the skin 3 (three) times daily with meals. (Patient taking differently: Inject 6 Units into the skin 3 (three) times daily with meals. ) 10 mL 2 02/11/2017  . montelukast (SINGULAIR) 10 MG tablet Take 1 tablet (10 mg total) by mouth at bedtime. (Patient not taking: Reported on 02/15/2017) 30 tablet 1 Not Taking at Unknown time    Patient Stressors: Marital or family conflict Other: blood sugar  Patient Strengths: General fund of knowledge Motivation for treatment/growth  Treatment Modalities: Medication Management, Group therapy, Case management,  1 to 1 session with clinician, Psychoeducation, Recreational therapy.   Physician Treatment Plan for Primary Diagnosis: Schizoaffective disorder, depressive type (HCC) Long Term Goal(s): Improvement in symptoms so as ready for discharge  Short Term Goals: Ability to verbalize feelings will improve Ability to demonstrate self-control will improve Compliance with prescribed medications will improve Ability to verbalize feelings will improve Ability to identify and develop effective coping behaviors will improve Compliance with prescribed medications will improve  Medication Management: Evaluate patient's response, side effects, and tolerance of medication regimen.  Therapeutic Interventions: 1 to 1 sessions, Unit Group sessions and Medication administration.  Evaluation of Outcomes: Adequate for Discharge   9/27: Patient with schizoaffective do , continues to have thought blocking , negative sx as well as HI. Pt continues to need a lot of support to take care of his ADLs as well as his medications.   Pt will benefit from being in a supervised, structured place - since he has been in and out of long hospital stays since May 2018 .  He has failed multiple trials of medications as well as recent ECT treatments( this month).   Increase Clozaril  to 25 mg po daily and 150 mg po qhs for psychosis. CBC with diff as per Clozaril therapy needs - Pharmacy consult placed- Results for JADARIUS, COMMONS (MRN [id number]) as of 02/17/2017 11:46  Prozac 40 mg po daily for affective sx. Propranolol 20 mg po bid for anxiety, tachycardia. Trazodone 50 mg po qhs prn for insomnia. Pharmacy consult for clozaril therapy. Repeat BMP for hx of hyponatremia - WNL. Consulted Dr.Clapac regarding ECT treatments - per Dr.Clapac - pt  will not be a good candidate at this time. Will refer to [facility] for further management. Will give PPD today 02/17/2017 for possible [facility] referral.  10/2: Continue clozapine 150 mg qhs, 50 mg daily (Confirmed patient registration at [facility]) - EKG reviewed; NSR, QTc 413 msec.  - Discussed  with nurse to monitor bowel movement.  - Continue fluoxetine 40 mg daily for depression - Continue propranolol 20 mg BID for anxiety/fine tremors. - Continue Trazodone 50 mg q hs for insomnia.   Physician Treatment Plan for Secondary Diagnosis: Principal Problem:   Schizoaffective disorder, depressive type (HCC) Active Problems:   Diabetes (HCC)   Neuroleptic-induced Parkinsonism (HCC)   Long Term Goal(s): Improvement in symptoms so as ready for discharge  Short Term Goals: Ability to verbalize feelings will improve Ability to demonstrate self-control will improve Compliance with prescribed medications will improve Ability to verbalize feelings will improve Ability to identify and develop effective coping behaviors will improve Compliance with prescribed medications will improve  Medication Management: Evaluate patient's response, side effects, and tolerance of medication regimen.  Therapeutic Interventions: 1 to 1 sessions, Unit Group sessions and Medication administration.  Evaluation of Outcomes: Adequate for Discharge   RN Treatment Plan for Primary Diagnosis: Schizoaffective disorder, depressive type (HCC) Long Term Goal(s): Knowledge of  disease and therapeutic regimen to maintain health will improve  Short Term Goals: Ability to disclose and discuss suicidal ideas and Ability to identify and develop effective coping behaviors will improve  Medication Management: RN will administer medications as ordered by provider, will assess and evaluate patient's response and provide education to patient for prescribed medication. RN will report any adverse and/or side effects to prescribing provider.  Therapeutic Interventions: 1 on 1 counseling sessions, Psychoeducation, Medication administration, Evaluate responses to treatment, Monitor vital signs and CBGs as ordered, Perform/monitor CIWA, COWS, AIMS and Fall Risk screenings as ordered, Perform wound care treatments as ordered.  Evaluation of Outcomes: Adequate for Discharge   LCSW Treatment Plan for Primary Diagnosis: Schizoaffective disorder, depressive type (HCC) Long Term Goal(s): Safe transition to appropriate next level of care at discharge, Engage patient in therapeutic group addressing interpersonal concerns.  Short Term Goals: Engage patient in aftercare planning with referrals and resources  Therapeutic Interventions: Assess for all discharge needs, 1 to 1 time with Social worker, Explore available resources and support systems, Assess for adequacy in community support network, Educate family and significant other(s) on suicide prevention, Complete Psychosocial Assessment, Interpersonal group therapy.  Evaluation of Outcomes: Progressing,  Referred pt to both group home and [facility]. Additional labs-will send when completed.  Pt and I are to call mother on weekend when mother is available with help of interpreter.   10/2:  Pt on [facility] wait list.  Visit tomorrow with [facility] representative for possible placement.  Have been unable to reach pt's mother via phone with pt.  Pt told me yesterday that she is not available until after 5PM.  Possible treatment team meeting coordinated by  [facility] on Thursday at 11:00  10/5:  Remains on [facility] wait list.  Accepted at [facility] in [place].  CSW to update care coordinator and mother re continued discharge planning to community.    10/8:  Pt to [facility] tomorrow-Ms Sanders-[phone]  10/12:  Ms Allyne Gee did not come Tuesday due to needing to address possible obstacles of MCD since family cannot produce a card, and making sure that getting his Clozaril and accompanying labs will not be an issue when he gets there.  Did not come Thursday as planned due to weather. Did not come today as Daymien called her and told her he was not ready until Monday. Today she states she will come Monday.  Progress in Treatment: Attending groups:Intermittently Participating in groups: Minimally Taking medication as prescribed: Yes Toleration  medication: Yes, no side effects reported at this time Family/Significant other contact made: No Patient understands diagnosis: No Limited insight Discussing patient identified problems/goals with staff: Yes Medical problems stabilized or resolved: Yes Denies suicidal/homicidal ideation: Yes Issues/concerns per patient self-inventory: None Other: [name]  New problem(s) identified: None identified at this time.   New Short Term/Long Term Goal(s): Chose to not participate in treatment team process. 10/5:  Patient referred for group home placement, [facility] accepted patient.  CSW to contact mother to discuss visitation options.  Will arrange aftercare in area of group home, CSW to discuss options for transportation  Discharge Plan or Barriers: Patient remaining agreeable to group home placement, weekend CSW to discuss options for visitation and contact w mother and update [facility] care coordinator.   10/15:  Group Home arriving to transport patient today.  No further discharge planning needs; mother visited w patient over weekend and encouraged him to work on adjusting to new setting; Shelly Coss has withdrawn  peer support services as these were not deemed effective; will follow up w community provider near location of new group home.  Reason for Continuation of Hospitalization:  Medication stabilization  Estimated Length of Stay: Likely d/c Monday  Attendees: Patient:  03/08/2017  11:41 AM  Physician: Nehemiah Massed, MD 03/08/2017  11:41 AM  Nursing: Roddie Mc, RN; Pierson RN 03/08/2017  11:41 AM  RN Care Manager:  03/08/2017  11:41 AM  Social Worker: Santa Genera LCSW 03/08/2017  11:41 AM  Recreational Therapist:  03/08/2017  11:41 AM  Other:  03/08/2017  11:41 AM  Other:  03/08/2017  11:41 AM    Scribe for Treatment Team:  Santa Genera LCSW 03/08/2017 11:41 AM

## 2017-03-08 NOTE — Progress Notes (Signed)
Recreation Therapy Notes  Date: 03/08/17 Time: 1000 Location: 500 Hall Dayroom  Group Topic: Goal Setting  Goal Area(s) Addresses:  Patient will be able to identify at least 3 life goals.   Patient will be able to identify benefit of investing in life goals.  Patient will be able to identify benefit of setting life goals.   Intervention: Worksheets, pens  Activity: Life Goals.  Patients were given a worksheet with six categories (family, friends, work/school, spirituality, body and mental health).  Patients were to explain what they were doing well, what they needed to improve and make a goal for each category.    Education:  Discharge Planning, Pharmacologist, Leisure Education   Education Outcome: Acknowledges Education/In Group Clarification Provided/Needs Additional Education  Clinical Observations: Pt did not attend group.   [name], LRT/CTRS         [name] A 03/08/2017 12:44 PM

## 2017-10-11 ENCOUNTER — Emergency Department
Admission: EM | Admit: 2017-10-11 | Discharge: 2017-10-11 | Discharge status: Absent without leave | Payer: Medicare Other

## 2017-10-11 ENCOUNTER — Other Ambulatory Visit: Payer: Self-pay

## 2017-10-11 ENCOUNTER — Inpatient Hospital Stay
Admission: AD | Admit: 2017-10-11 | Discharge: 2017-10-15 | DRG: 883 | Disposition: A | Discharge status: Home / Self Care | Payer: Medicare Other | Attending: Psychiatry | Admitting: Psychiatry

## 2017-10-11 DIAGNOSIS — I10 Essential (primary) hypertension: Secondary | ICD-10-CM | POA: Diagnosis not present

## 2017-10-11 DIAGNOSIS — Z888 Allergy status to other drugs, medicaments and biological substances status: Secondary | ICD-10-CM

## 2017-10-11 DIAGNOSIS — E119 Type 2 diabetes mellitus without complications: Secondary | ICD-10-CM | POA: Diagnosis not present

## 2017-10-11 DIAGNOSIS — F250 Schizoaffective disorder, bipolar type: Secondary | ICD-10-CM | POA: Diagnosis not present

## 2017-10-11 DIAGNOSIS — F6810 Factitious disorder, unspecified: Principal | ICD-10-CM | POA: Diagnosis present

## 2017-10-11 DIAGNOSIS — Z794 Long term (current) use of insulin: Secondary | ICD-10-CM | POA: Diagnosis not present

## 2017-10-11 DIAGNOSIS — Z79899 Other long term (current) drug therapy: Secondary | ICD-10-CM | POA: Diagnosis not present

## 2017-10-11 DIAGNOSIS — F17210 Nicotine dependence, cigarettes, uncomplicated: Secondary | ICD-10-CM | POA: Diagnosis present

## 2017-10-11 DIAGNOSIS — Z8659 Personal history of other mental and behavioral disorders: Secondary | ICD-10-CM

## 2017-10-11 DIAGNOSIS — F419 Anxiety disorder, unspecified: Secondary | ICD-10-CM | POA: Diagnosis present

## 2017-10-11 DIAGNOSIS — R4183 Borderline intellectual functioning: Secondary | ICD-10-CM | POA: Diagnosis not present

## 2017-10-11 DIAGNOSIS — K219 Gastro-esophageal reflux disease without esophagitis: Secondary | ICD-10-CM | POA: Diagnosis present

## 2017-10-11 DIAGNOSIS — R45851 Suicidal ideations: Secondary | ICD-10-CM | POA: Diagnosis present

## 2017-10-11 DIAGNOSIS — R45850 Homicidal ideations: Secondary | ICD-10-CM | POA: Diagnosis present

## 2017-10-11 DIAGNOSIS — J45909 Unspecified asthma, uncomplicated: Secondary | ICD-10-CM | POA: Diagnosis present

## 2017-10-11 DIAGNOSIS — F603 Borderline personality disorder: Secondary | ICD-10-CM | POA: Diagnosis not present

## 2017-10-11 LAB — GLUCOSE, CAPILLARY: Glucose-Capillary: 235 mg/dL — ABNORMAL HIGH (ref 65–99)

## 2017-10-11 MED ORDER — HYDROXYZINE HCL 50 MG PO TABS: 50.0000 mg | ORAL_TABLET | Freq: Three times a day (TID) | ORAL | Status: DC | PRN

## 2017-10-11 MED ORDER — PANTOPRAZOLE SODIUM 40 MG PO TBEC
40.0000 mg | DELAYED_RELEASE_TABLET | Freq: Every day | ORAL | Status: DC
  Administered 2017-10-11 – 2017-10-15 (×5): 40 mg via ORAL
  Filled 2017-10-11 (×5): qty 1

## 2017-10-11 MED ORDER — ACETAMINOPHEN 325 MG PO TABS: 650.0000 mg | ORAL_TABLET | Freq: Four times a day (QID) | ORAL | Status: DC | PRN

## 2017-10-11 MED ORDER — INSULIN ASPART 100 UNIT/ML ~~LOC~~ SOLN
0.0000 [IU] | Freq: Three times a day (TID) | SUBCUTANEOUS | Status: DC
  Administered 2017-10-12: 15 [IU] via SUBCUTANEOUS
  Administered 2017-10-12: 5 [IU] via SUBCUTANEOUS
  Administered 2017-10-13: 2 [IU] via SUBCUTANEOUS
  Administered 2017-10-13: 3 [IU] via SUBCUTANEOUS
  Administered 2017-10-13: 2 [IU] via SUBCUTANEOUS
  Administered 2017-10-14: 11 [IU] via SUBCUTANEOUS
  Administered 2017-10-14: 5 [IU] via SUBCUTANEOUS
  Administered 2017-10-14: 3 [IU] via SUBCUTANEOUS
  Administered 2017-10-15: 11 [IU] via SUBCUTANEOUS
  Administered 2017-10-15: 8 [IU] via SUBCUTANEOUS
  Filled 2017-10-11 (×6): qty 1

## 2017-10-11 MED ORDER — METFORMIN HCL 500 MG PO TABS
1000.0000 mg | ORAL_TABLET | Freq: Two times a day (BID) | ORAL | Status: DC
  Administered 2017-10-12 – 2017-10-15 (×8): 1000 mg via ORAL
  Filled 2017-10-11 (×8): qty 2

## 2017-10-11 MED ORDER — CLOZAPINE 25 MG PO TABS
50.0000 mg | ORAL_TABLET | Freq: Every day | ORAL | Status: DC
  Administered 2017-10-12: 50 mg via ORAL
  Filled 2017-10-11: qty 2

## 2017-10-11 MED ORDER — ALUM & MAG HYDROXIDE-SIMETH 200-200-20 MG/5ML PO SUSP: 30.0000 mL | ORAL | Status: DC | PRN

## 2017-10-11 MED ORDER — CLOZAPINE 25 MG PO TABS
150.0000 mg | ORAL_TABLET | Freq: Every day | ORAL | Status: DC
  Administered 2017-10-11: 150 mg via ORAL
  Filled 2017-10-11: qty 1

## 2017-10-11 MED ORDER — LISINOPRIL 20 MG PO TABS
20.0000 mg | ORAL_TABLET | Freq: Every day | ORAL | Status: DC
  Administered 2017-10-12 – 2017-10-15 (×3): 20 mg via ORAL
  Filled 2017-10-11 (×5): qty 1

## 2017-10-11 MED ORDER — FLUOXETINE HCL 20 MG PO CAPS
40.0000 mg | ORAL_CAPSULE | Freq: Every day | ORAL | Status: DC
  Administered 2017-10-11 – 2017-10-12 (×2): 40 mg via ORAL
  Filled 2017-10-11 (×2): qty 2

## 2017-10-11 MED ORDER — PROPRANOLOL HCL 20 MG PO TABS
20.0000 mg | ORAL_TABLET | Freq: Two times a day (BID) | ORAL | Status: DC
  Administered 2017-10-12 – 2017-10-15 (×7): 20 mg via ORAL
  Filled 2017-10-11 (×9): qty 1

## 2017-10-11 MED ORDER — TRAZODONE HCL 100 MG PO TABS
100.0000 mg | ORAL_TABLET | Freq: Every evening | ORAL | Status: DC | PRN
  Administered 2017-10-12: 100 mg via ORAL
  Filled 2017-10-11: qty 1

## 2017-10-11 MED ORDER — TRAZODONE HCL 50 MG PO TABS
50.0000 mg | ORAL_TABLET | Freq: Every day | ORAL | Status: DC
  Administered 2017-10-11: 50 mg via ORAL
  Filled 2017-10-11: qty 1

## 2017-10-11 MED ORDER — MAGNESIUM HYDROXIDE 400 MG/5ML PO SUSP: 30.0000 mL | Freq: Every day | ORAL | Status: DC | PRN

## 2017-10-11 MED ORDER — INSULIN GLARGINE 100 UNIT/ML ~~LOC~~ SOLN
40.0000 [IU] | Freq: Every day | SUBCUTANEOUS | Status: DC
  Administered 2017-10-11 – 2017-10-14 (×4): 40 [IU] via SUBCUTANEOUS
  Filled 2017-10-11 (×5): qty 0.4

## 2017-10-11 MED ORDER — INSULIN ASPART 100 UNIT/ML ~~LOC~~ SOLN
5.0000 [IU] | Freq: Three times a day (TID) | SUBCUTANEOUS | Status: DC
  Administered 2017-10-11 – 2017-10-15 (×12): 5 [IU] via SUBCUTANEOUS
  Filled 2017-10-11 (×11): qty 1

## 2017-10-11 NOTE — Progress Notes (Signed)
MEDICATION RELATED CONSULT NOTE - INITIAL   Pharmacy Consult for Clozaril  Indication: schizophrenia   Allergies  Allergen Reactions  . Seroquel [Quetiapine Fumarate]     dizziness    Patient Measurements: Height: 6' (182.9 cm) Weight: 150 lb (68 kg) IBW/kg (Calculated) : 77.6 Adjusted Body Weight:   Vital Signs: Temp: 98.2 F (36.8 C) (05/20 1815) Temp Source: Oral (05/20 1815) BP: 116/70 (05/20 1815) Pulse Rate: 78 (05/20 1815) Intake/Output from previous day: No intake/output data recorded. Intake/Output from this shift: No intake/output data recorded.  Labs: No results for input(s): WBC, HGB, HCT, PLT, APTT, CREATININE, LABCREA, CREATININE, CREAT24HRUR, MG, PHOS, ALBUMIN, PROT, ALBUMIN, AST, ALT, ALKPHOS, BILITOT, BILIDIR, IBILI in the last 72 hours. CrCl cannot be calculated (Patient's most recent lab result is older than the maximum 21 days allowed.).   Microbiology: No results found for this or any previous visit (from the past 720 hour(s)).  Medical History: Past Medical History:  Diagnosis Date  . Anxiety   . Asthma   . Diabetes mellitus   . High blood pressure   . Sinus complaint     Medications:  Medications Prior to Admission  Medication Sig Dispense Refill Last Dose  . clozapine (CLOZARIL) 50 MG tablet Take 3 tablets (150 mg total) by mouth at bedtime. For mood control 90 tablet 0   . cloZAPine (CLOZARIL) 50 MG tablet Take 1 tablet (50 mg total) by mouth daily. For mood control 30 tablet 0   . FLUoxetine (PROZAC) 40 MG capsule Take 1 capsule (40 mg total) by mouth daily. For depression 30 capsule 0   . insulin aspart (NOVOLOG) 100 UNIT/ML injection Inject 5 Units into the skin 3 (three) times daily with meals. For diabetes management 10 mL 0   . insulin glargine (LANTUS) 100 UNIT/ML injection Inject 0.4 mLs (40 Units total) into the skin at bedtime. For diabetes management 10 mL 0   . lisinopril (PRINIVIL,ZESTRIL) 20 MG tablet Take 1 tablet (20 mg  total) by mouth daily. For high blood pressure 10 tablet 0   . metFORMIN (GLUCOPHAGE) 1000 MG tablet Take 1 tablet (1,000 mg total) by mouth 2 (two) times daily with a meal. For diabetes management 30 tablet 0   . montelukast (SINGULAIR) 10 MG tablet Take 1 tablet (10 mg total) by mouth at bedtime. For Asthma 15 tablet 0   . pantoprazole (PROTONIX) 40 MG tablet Take 1 tablet (40 mg total) by mouth daily. For acid reflux 15 tablet 0   . propranolol (INDERAL) 20 MG tablet Take 1 tablet (20 mg total) by mouth 2 (two) times daily. For tremors/anxiety 60 tablet 0   . traZODone (DESYREL) 50 MG tablet Take 1 tablet (50 mg) by mouth at bedtime: For sleep 30 tablet 0     Assessment: ANC :    09/17/17 - 2214   ;   09/21/17 - 2900 Pt requires ANC checks every 4 weeks.   Goal of Therapy:  ANC within normal limits  Plan:  Pt registered with [facility] and ok to dispense as of 5/20. ANC ordered for 10/21/17.   Jamyia Fortune D 10/11/2017,7:16 PM

## 2017-10-11 NOTE — Tx Team (Signed)
Initial Treatment Plan 10/11/2017 7:42 PM Tony Cunningham YSA:[id number]    PATIENT STRESSORS: Health problems Medication change or noncompliance   PATIENT STRENGTHS: Communication skills Supportive family/friends   PATIENT IDENTIFIED PROBLEMS: Homicidal ideations   Auditory hallucinations                   DISCHARGE CRITERIA:  Adequate post-discharge living arrangements Reduction of life-threatening or endangering symptoms to within safe limits Verbal commitment to aftercare and medication compliance  PRELIMINARY DISCHARGE PLAN: Attend aftercare/continuing care group Return to previous living arrangement  PATIENT/FAMILY INVOLVEMENT: This treatment plan has been presented to and reviewed with the patient, Tony Cunningham, and/or family member, .  The patient and family have been given the opportunity to ask questions and make suggestions.  Leonarda Salon, RN 10/11/2017, 7:42 PM

## 2017-10-11 NOTE — Plan of Care (Signed)
Pt out in the milieu. Pt had an odor and was encouraged to take a shower in which he did. Pt denies SI/HI. Due to Pt's Bp being 102/69, evening Bp medications were held. Pt was compliant with medications. Due to patient being admitted right before nursing report, pt did not receive the 1900 insulin till 2055. Pt is receptive to treatment and safety maintained on unit.  Will continue to monitor.  Problem: Health Behavior/Discharge Planning: Goal: Identification of resources available to assist in meeting health care needs will improve Outcome: Progressing   Problem: Coping: Goal: Ability to interact with others will improve Outcome: Progressing Goal: Ability to use eye contact when communicating with others will improve Outcome: Progressing   Problem: Self-Concept: Goal: Will verbalize positive feelings about self Outcome: Progressing

## 2017-10-11 NOTE — Progress Notes (Deleted)
Patient ID: Tony Cunningham, male   DOB: 09-Oct-1975, 42 y.o.   MRN: [id number] PER STATE REGULATIONS 482.30  THIS CHART WAS REVIEWED FOR MEDICAL NECESSITY WITH RESPECT TO THE PATIENT'S ADMISSION/DURATION OF STAY.  NEXT REVIEW DATE:10/15/17  Loura Halt, RN, BSN CASE MANAGER

## 2017-10-11 NOTE — BH Assessment (Signed)
Patient has been accepted to [facility].  Accepting physician is Dr. Johnella Moloney  Attending Physician will be Dr. Johnella Moloney  Patient has been assigned to room 313-A, by [facility] Charge Nurse Gigi.  Call report to [phone].  Representative/Transfer Coordinator is Warden/ranger Patient pre-admitted by [facility] Patient Access Ivin Booty)  [facility] Staff Carney Bern, Disposition Social Worker) made aware of acceptance.

## 2017-10-11 NOTE — BH Assessment (Signed)
Writer received phone call from patient's sister-n-law ([name]-[phone]) stating she was calling for his mother but she was unable to give his pass code. She states she was giving [facility] number by [facility]. Writer took her name and phone number and passed it to the patient. He stated he knew who she was.

## 2017-10-11 NOTE — Progress Notes (Signed)
Patient calm and cooperative during admission assessment. Patient verbalized positive for homicidal thoughts.Contracts for safety.Denies SI at this time.Patient states that the voices telling him to mess up with others. Patient informed of fall risk status, fall risk assessed "low" at this time. Patient oriented to unit/staff/room. Patient denies any questions/concerns at this time. Patient safe on unit with Q15 minute checks for safety. Skin assessment and body search done ,no contraband found.

## 2017-10-12 DIAGNOSIS — F250 Schizoaffective disorder, bipolar type: Secondary | ICD-10-CM

## 2017-10-12 DIAGNOSIS — F6810 Factitious disorder, unspecified: Secondary | ICD-10-CM | POA: Diagnosis not present

## 2017-10-12 LAB — GLUCOSE, CAPILLARY
GLUCOSE-CAPILLARY: 381 mg/dL — AB (ref 65–99)
Glucose-Capillary: 73 mg/dL (ref 65–99)
Glucose-Capillary: 205 mg/dL — ABNORMAL HIGH (ref 65–99)
Glucose-Capillary: 308 mg/dL — ABNORMAL HIGH (ref 65–99)

## 2017-10-12 LAB — LIPID PANEL
CHOL/HDL RATIO: 3.2 ratio
Cholesterol: 151 mg/dL (ref 0–200)
HDL: 47 mg/dL (ref >40)
LDL CALC: 80 mg/dL (ref 0–99)
Triglycerides: 122 mg/dL (ref <150)
VLDL: 24 mg/dL (ref 0–40)

## 2017-10-12 LAB — TSH: TSH: 2.688 u[IU]/mL (ref 0.350–4.500)

## 2017-10-12 LAB — HEMOGLOBIN A1C
HEMOGLOBIN A1C: 7.9 % — AB (ref 4.8–5.6)
Mean Plasma Glucose: 180.03 mg/dL

## 2017-10-12 MED ORDER — RISPERIDONE MICROSPHERES 50 MG IM SUSR: 50.0000 mg | INTRAMUSCULAR | Status: DC

## 2017-10-12 MED ORDER — DIVALPROEX SODIUM ER 500 MG PO TB24
500.0000 mg | ORAL_TABLET | Freq: Every day | ORAL | Status: DC
  Administered 2017-10-12: 500 mg via ORAL
  Filled 2017-10-12: qty 1

## 2017-10-12 MED ORDER — HALOPERIDOL LACTATE 5 MG/ML IJ SOLN
10.0000 mg | Freq: Four times a day (QID) | INTRAMUSCULAR | Status: DC | PRN
  Administered 2017-10-12: 10 mg via INTRAMUSCULAR
  Filled 2017-10-12: qty 2

## 2017-10-12 MED ORDER — LORAZEPAM 2 MG PO TABS
2.0000 mg | ORAL_TABLET | Freq: Four times a day (QID) | ORAL | Status: DC | PRN
  Administered 2017-10-14: 2 mg via ORAL
  Filled 2017-10-12: qty 1

## 2017-10-12 MED ORDER — DIPHENHYDRAMINE HCL 25 MG PO CAPS: 50.0000 mg | ORAL_CAPSULE | Freq: Four times a day (QID) | ORAL | Status: DC | PRN

## 2017-10-12 MED ORDER — DIPHENHYDRAMINE HCL 50 MG/ML IJ SOLN
50.0000 mg | Freq: Four times a day (QID) | INTRAMUSCULAR | Status: DC | PRN
  Administered 2017-10-12: 50 mg via INTRAMUSCULAR
  Filled 2017-10-12: qty 1

## 2017-10-12 MED ORDER — LORAZEPAM 2 MG/ML IJ SOLN
2.0000 mg | Freq: Four times a day (QID) | INTRAMUSCULAR | Status: DC | PRN
  Administered 2017-10-12: 2 mg via INTRAMUSCULAR
  Filled 2017-10-12: qty 1

## 2017-10-12 MED ORDER — HALOPERIDOL 5 MG PO TABS
10.0000 mg | ORAL_TABLET | Freq: Four times a day (QID) | ORAL | Status: DC | PRN
  Administered 2017-10-14: 10 mg via ORAL
  Filled 2017-10-12: qty 2

## 2017-10-12 MED ORDER — CLOZAPINE 25 MG PO TABS: 50.0000 mg | ORAL_TABLET | Freq: Every day | ORAL | Status: DC

## 2017-10-12 NOTE — BHH Counselor (Signed)
CSW called Nelma Rothman [phone]) the patients Care Coordinator with [facility] to obtain collateral information for the patient. Mrs.Reiman is out of the office for vacation and will not be returning until Thursday 10/14/2017. CSW left a voicemail for her to return the call. CSW will call again later in the week.    [name], MSW, [place], LCASA 10/12/2017 10:43 AM

## 2017-10-12 NOTE — Progress Notes (Signed)
Recreation Therapy Notes  Date: 10/12/2017  Time: 9:30 am   Location: Craft Room   Behavioral response: [name]   Intervention Topic: Self-esteem  Discussion/Intervention: Patient did not attend group.   Clinical Observations/Feedback:  Patient did not attend group.   Diamond Jentz LRT/CTRS        Docie Abramovich 10/12/2017 10:43 AM
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
## 2017-10-12 NOTE — [facility] Group Notes (Signed)
CSW Group Therapy Note  10/12/2017  Time:  0900  Type of Therapy and Topic: Group Therapy: Goals Group: SMART Goals    Participation Level:  Did Not Attend    Description of Group:   The purpose of a daily goals group is to assist and guide patients in setting recovery/wellness-related goals. The objective is to set goals as they relate to the crisis in which they were admitted. Patients will be using SMART goal modalities to set measurable goals. Characteristics of realistic goals will be discussed and patients will be assisted in setting and processing how one will reach their goal. Facilitator will also assist patients in applying interventions and coping skills learned in psycho-education groups to the SMART goal and process how one will achieve defined goal.    Therapeutic Goals:  -Patients will develop and document one goal related to or their crisis in which brought them into treatment.  -Patients will be guided by LCSW using SMART goal setting modality in how to set a measurable, attainable, realistic and time sensitive goal.  -Patients will process barriers in reaching goal.  -Patients will process interventions in how to overcome and successful in reaching goal.    Patient's Goal:  Pt was invited to attend group but chose not to attend. CSW will continue to encourage pt to attend group throughout their admission.   Therapeutic Modalities:  Motivational Interviewing  Cognitive Behavioral Therapy  Crisis Intervention Model  SMART goals setting  Heidi Dach, MSW, LCSW Clinical Social Worker 10/12/2017 9:46 AM
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
## 2017-10-12 NOTE — Progress Notes (Signed)
Patient ID: Tony Cunningham, male   DOB: Jun 25, 1975, 42 y.o.   MRN: [id number] Per State regulations 482.30 this chart was reviewed for medical necessity with respect to the patient's admission/duration of stay.    Next review date: 10/15/17  Thurman Coyer, BSN, RN-BC  Case Manager
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
## 2017-10-12 NOTE — B[facility] Group Notes (Signed)
[facility] Group Notes:  (Nursing/MHT/Case Management/Adjunct)  Date:  10/12/2017  Time:  10:21 PM  Type of Therapy:  Group Therapy  Participation Level:  Did Not Attend  Summary of Progress/Problems:  Tony Cunningham 10/12/2017, 10:21 PM

## 2017-10-12 NOTE — BHH Counselor (Signed)
CSW requested hospital records on the patient from [facility].   [name], MSW, [place], [place] 10/12/2017 3:17 PM

## 2017-10-12 NOTE — BHH Counselor (Signed)
CSW called the patients sister Migel Hannis [phone]) to obtain collateral information. CSW left a voicemail with a callback number.  [name], MSW, [place], LCASA 10/12/2017 11:30 AM

## 2017-10-12 NOTE — BHH Counselor (Signed)
Adult Comprehensive Assessment  Patient ID: Tony Cunningham, male   DOB: [birth year], 42 y.o.   MRN: [id number]  Information source: Tony Cunningham, sister-in-law   Current Stressors:  Educational / Learning stressors: n/a Employment / Job issues: Pt is on disability. Family Relationships: n/a Surveyor, quantity / Lack of resources Fixed income Housing / Lack of housing: n/a Physical health (include injuries & life threatening diseases): diabetes, hypertension, dyslipidemia, asthma, and tardive dyskinesia.  Social relationships: n/a Substance abuse: n/a Bereavement / Loss: n/a    Living/Environment/Situation:  Living Arrangements: Parent-mother Living conditions (as described by patient or guardian): Tony Cunningham reports that the patient has been living with his mother for at least 2 years. He was previously at a group home but walked out. Since then he has been back living with his mother. How long has patient lived in current situation?: 2 years  What is atmosphere in current home: Good, supportive, loving.    Family History:  Marital status: Single Are you sexually active?: No What is your sexual orientation?: heterosexual Has your sexual activity been affected by drugs, alcohol, medication, or emotional stress?: none Does patient have children?: No   Childhood History:  By whom was/is the patient raised?: Both parents Additional childhood history information: Pt reports having a good childhood.  No issues reported.  Description of patient's relationship with caregiver when they were a child: Pt reports being closer to his father.  Patient's description of current relationship with people who raised him/her: Pt sees his parents weekly.  Pt reports he gets along with his parents. How were you disciplined when you got in trouble as a child/adolescent?: appropriate discipline Does patient have siblings?: Yes Number of Siblings: 3 Description of patient's current relationship with siblings: 2 brothers, 1  sister - Pt reports being close to 1 brother and his sister.   Did patient suffer any verbal/emotional/physical/sexual abuse as a child?: No Did patient suffer from severe childhood neglect?: No Has patient ever been sexually abused/assaulted/raped as an adolescent or adult?: No Was the patient ever a victim of a crime or a disaster?: No Witnessed domestic violence?: No Has patient been effected by domestic violence as an adult?: No   Education:  Highest grade of school patient has completed: 11 Currently a Consulting civil engineer?: No Learning disability?:  (unsure)   Employment/Work Situation:   Employment situation: On disability Why is patient on disability: mental health How long has patient been on disability: "all my life" Patient's job has been impacted by current illness: No What is the longest time patient has a held a job?: [employer] Where was the patient employed at that time?: 8-11 years Has patient ever been in the [employer]?: No Are There Guns or Other Weapons in Your Home?: No   Financial Resources:   Surveyor, quantity resources: Writer, Medicaid/Medicare,  Does patient have a Lawyer or guardian?: No   Alcohol/Substance Abuse:   What has been your use of drugs/alcohol within the last 12 months?: None reported If attempted suicide, did drugs/alcohol play a role in this?: No Alcohol/Substance Abuse Treatment Hx: Denies past history Has alcohol/substance abuse ever caused legal problems?: No   Social Support System:   Conservation officer, nature Support System: Good Describe Community Support System: Mother, sister, sister-n-law, family members, friends.  Type of faith/religion: Catholic   Leisure/Recreation:   Leisure and Hobbies: video games   Strengths/Needs:   What things does the patient do well?: Friendly, resilient In what areas does patient struggle / problems for patient: medication management.  Discharge Plan:   Does patient have access to  transportation?: Yes- Tony Cunningham will come at discharge.  Will patient be returning to same living situation after discharge?: Yes, his mothers house.  Currently receiving community mental health services: No  Does patient have financial barriers related to discharge medications?: No  Summary/Recommendations:   Summary and Recommendations (to be completed by the evaluator): Patient is a 42 year old Hispanic/Latino male admitted involuntarily due to [facility] and SI. Per IVC paperwork, the patient presented to ED for HI and SI and AH with voices saying, "awful things." He reported feeling suicidal with a plan to jump from a window. He was also threatening to hurt everyone. Patient lives in [place] with his mother. Pts. affect was constricted, and he was a poor historian. Pts. Sister-in-law Tony Cunningham was able to provide collateral information for the patient. She denies any alcohol/substance use. At discharge, patient will return home with his mother and be referred to an ACTT Team. While here, patient will benefit from crisis stabilization, medication evaluation, group therapy and psychoeducation, in addition to case management for discharge planning. At discharge, it is recommended that patient remain compliant with the established discharge plan and continue treatment.   Tony Cunningham. 10/12/2017

## 2017-10-12 NOTE — [facility] Suicide Risk Assessment (Signed)
[facility] INPATIENT:  Family/Significant Other Suicide Prevention Education  Suicide Prevention Education:  Contact Attempts: [name], patients sister in law, [phone] and Janace Litten, patients mother, [phone] has been identified by the patient as the family member/significant other with whom the patient will be residing, and identified as the person(s) who will aid the patient in the event of a mental health crisis.  With written consent from the patient, two attempts were made to provide suicide prevention education, prior to and/or following the patient's discharge.  We were unsuccessful in providing suicide prevention education.  A suicide education pamphlet was given to the patient to share with family/significant other.  Date and time of first attempt: CSW called to complete SPE and obtain collateral information. Pts. Sister-in-law [name]'s phone is currently disconnected. CSW left a message on the patients mother Janace Litten) phone to give the CSW a call back. 10/12/2017 at 10:30am.  Date and time of second attempt  [name] 10/12/2017, 10:37 AM

## 2017-10-12 NOTE — Progress Notes (Signed)
Recreation Therapy Notes  INPATIENT RECREATION THERAPY ASSESSMENT  Patient Details Name: Tony Cunningham MRN: [id number] DOB: 09-23-1975 Today's Date: 10/12/2017   Patient refused assessment.       Information Obtained From:    Able to Participate in Assessment/Interview:    Patient Presentation:    Reason for Admission (Per Patient):    Patient Stressors:    Coping Skills:      Leisure Interests (2+):     Frequency of Recreation/Participation:    Awareness of Community Resources:     [employer]:     Current Use:    If no, Barriers?:    Expressed Interest in [employer] Information:    [place] of Residence:     Patient Main Form of Transportation:    Patient Strengths:     Patient Identified Areas of Improvement:     Patient Goal for Hospitalization:     Current SI (including self-harm):     Current HI:     Current AVH:    Staff Intervention Plan:    Consent to Intern Participation:    Jenesis Martin 10/12/2017, 4:11 PM

## 2017-10-12 NOTE — Plan of Care (Signed)
Patient is alert and responsive when spoken to. Oriented to self and place. Lies in bed resting with eyes closed majority of the time. When this writer entered the room of the patient it smelled of urine and feces. Patient left feces in the toilet clogged with toilet paper with a towel on the floor that was dirty. This writer flushed the toilet and patient was asked to get up out of bed to pick up his dirty clothes to dispose of them properly. Patient took medications but otherwise isolated himself to his room. BS monitored before meals and coverage administered per orders. Denies hearing voices at this time and does not express any thoughts of wanting to self harm or anyone else. Milieu remains safe with q 15 minute safety.

## 2017-10-13 DIAGNOSIS — F250 Schizoaffective disorder, bipolar type: Secondary | ICD-10-CM | POA: Diagnosis not present

## 2017-10-13 DIAGNOSIS — F6810 Factitious disorder, unspecified: Secondary | ICD-10-CM | POA: Diagnosis not present

## 2017-10-13 LAB — GLUCOSE, CAPILLARY
GLUCOSE-CAPILLARY: 150 mg/dL — AB (ref 65–99)
GLUCOSE-CAPILLARY: 180 mg/dL — AB (ref 65–99)
GLUCOSE-CAPILLARY: 239 mg/dL — AB (ref 65–99)
Glucose-Capillary: 122 mg/dL — ABNORMAL HIGH (ref 65–99)

## 2017-10-13 LAB — CLOZAPINE (CLOZARIL)
CLOZAPINE LVL: 56 ng/mL — AB (ref 350–650)
NORCLOZAPINE: 32 ng/mL
TOTAL(CLOZ+ NORCLOZ): 88 ng/mL

## 2017-10-13 MED ORDER — DIVALPROEX SODIUM 500 MG PO DR TAB
500.0000 mg | DELAYED_RELEASE_TABLET | Freq: Two times a day (BID) | ORAL | Status: DC
Start: 2017-10-13 — End: 2017-10-15
  Administered 2017-10-13 – 2017-10-15 (×5): 500 mg via ORAL
  Filled 2017-10-13 (×6): qty 1

## 2017-10-13 MED ORDER — RISPERIDONE 1 MG PO TABS
1.0000 mg | ORAL_TABLET | Freq: Two times a day (BID) | ORAL | Status: DC
  Administered 2017-10-13: 1 mg via ORAL
  Filled 2017-10-13: qty 1

## 2017-10-13 NOTE — Progress Notes (Signed)
800: Patient laying down in bed awake, nurse introduced self to patient and asked assessment questions, patient denies SI,HI and AVH this morning. Patient took medications and stated he was going to eat breakfast. Patient states he slept fine last night. Patient appears to be anxious this morning, and quiet. Nurse will continue to monitor.  900: Patient laying in bed awake. Patient has no signs of discomfort. Nurse will continue to monitor.  10 am-11 am: Patient escorted down to treatment team with MHT and Security guard. Patient does not appear to be in any discomfort. Nurse will continue to monitor.
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
## 2017-10-13 NOTE — Progress Notes (Signed)
Patient on 1:1 no distress noted sitter at bedside, and security, patient is lying in bed no distress noted, respiration even non labored, will continue to monitor.

## 2017-10-13 NOTE — Progress Notes (Signed)
Patient sitting up in bed picking at an old wound on his knee making it bleed, nurse gave cleansed with water placed band aid on top of wound.  Patient talked to nurse about wanting a games system and to use the telephone.  RN instructed MHT to let patient use telephone. No distress noted. MHT and security with in view of patient. Safety maintained.

## 2017-10-13 NOTE — [facility] Counselor (Signed)
CSW sent off an ACTT team referral to [facility].   [name], MSW, [place], LCASA 10/13/2017 10:19 AM

## 2017-10-13 NOTE — Progress Notes (Addendum)
Patient was reassessed again for the face to face assessment; patient appears calmer less verbally or physically aggressive. Patient apologized for his actions he appeared remorseful and explained he wasn't hearing any more voices.. patient was taken out of the seclusion room and placed on 1:1 per MD's order. Patient has a Comptroller and security at bedside, no distress noted, respiration even non labored able to move all extremities ROME 4  will continue to closely monitor.

## 2017-10-13 NOTE — Progress Notes (Signed)
Patient awake in bed talking with MHT. Patient in no distress noted RN will continue to monitor.

## 2017-10-13 NOTE — Tx Team (Addendum)
Interdisciplinary Treatment and Diagnostic Plan Update  10/13/2017 Time of Session: 11am Tony Cunningham MRN: [id number]  Principal Diagnosis: Factitious disorder  Secondary Diagnoses: Principal Problem:   Factitious disorder   Current Medications:  Current Facility-Administered Medications  Medication Dose Route Frequency Provider Last Rate Last Dose  . acetaminophen (TYLENOL) tablet 650 mg  650 mg Oral Q6H PRN [name], [name], MD      . alum & mag hydroxide-simeth (MAALOX/MYLANTA) 200-200-20 MG/5ML suspension 30 mL  30 mL Oral Q4H PRN [name], [name], MD      . diphenhydrAMINE (BENADRYL) capsule 50 mg  50 mg Oral Q6H PRN [name], [name], MD       Or  . diphenhydrAMINE (BENADRYL) injection 50 mg  50 mg Intramuscular Q6H PRN [name], [name], MD   50 mg at 10/12/17 2028  . divalproex (DEPAKOTE) DR tablet 500 mg  500 mg Oral Q12H McNew, Ileene Hutchinson, MD   500 mg at 10/13/17 0951  . haloperidol (HALDOL) tablet 10 mg  10 mg Oral Q6H PRN [name], [name], MD       Or  . haloperidol lactate (HALDOL) injection 10 mg  10 mg Intramuscular Q6H PRN [name], [name], MD   10 mg at 10/12/17 2027  . hydrOXYzine (ATARAX/VISTARIL) tablet 50 mg  50 mg Oral TID PRN [name], [name], MD      . insulin aspart (novoLOG) injection 0-15 Units  0-15 Units Subcutaneous TID WC [name], [name], MD   2 Units at 10/13/17 0800  . insulin aspart (novoLOG) injection 5 Units  5 Units Subcutaneous TID WC [name], [name], MD   5 Units at 10/13/17 [phone]  . insulin glargine (LANTUS) injection 40 Units  40 Units Subcutaneous QHS [name], [name], MD   40 Units at 10/12/17 2100  . lisinopril (PRINIVIL,ZESTRIL) tablet 20 mg  20 mg Oral Daily [name], [name], MD   20 mg at 10/12/17 0829  . LORazepam (ATIVAN) tablet 2 mg  2 mg Oral Q6H PRN [name], [name], MD       Or  . LORazepam (ATIVAN) injection 2 mg  2 mg Intramuscular Q6H PRN [name], [name], MD   2 mg at 10/12/17 2030  . magnesium hydroxide (MILK  OF MAGNESIA) suspension 30 mL  30 mL Oral Daily PRN [name], [name], MD      . metFORMIN (GLUCOPHAGE) tablet 1,000 mg  1,000 mg Oral BID WC [name], [name], MD   1,000 mg at 10/13/17 0802  . pantoprazole (PROTONIX) EC tablet 40 mg  40 mg Oral Daily [name], [name], MD   40 mg at 10/13/17 0802  . propranolol (INDERAL) tablet 20 mg  20 mg Oral BID [name], [name], MD   20 mg at 10/12/17 1708  . [START ON 10/17/2017] risperiDONE microspheres (RISPERDAL CONSTA) injection 50 mg  50 mg Intramuscular Q14 Days McNew, Ileene Hutchinson, MD      . traZODone (DESYREL) tablet 100 mg  100 mg Oral QHS PRN [name], [name], MD   100 mg at 10/12/17 2101   PTA Medications: Medications Prior to Admission  Medication Sig Dispense Refill Last Dose  . clozapine (CLOZARIL) 50 MG tablet Take 3 tablets (150 mg total) by mouth at bedtime. For mood control 90 tablet 0   . cloZAPine (CLOZARIL) 50 MG tablet Take 1 tablet (50 mg total) by mouth daily. For mood control 30 tablet 0   . FLUoxetine (PROZAC) 40 MG capsule Take 1 capsule (  40 mg total) by mouth daily. For depression 30 capsule 0   . insulin aspart (NOVOLOG) 100 UNIT/ML injection Inject 5 Units into the skin 3 (three) times daily with meals. For diabetes management 10 mL 0   . insulin glargine (LANTUS) 100 UNIT/ML injection Inject 0.4 mLs (40 Units total) into the skin at bedtime. For diabetes management 10 mL 0   . lisinopril (PRINIVIL,ZESTRIL) 20 MG tablet Take 1 tablet (20 mg total) by mouth daily. For high blood pressure 10 tablet 0   . metFORMIN (GLUCOPHAGE) 1000 MG tablet Take 1 tablet (1,000 mg total) by mouth 2 (two) times daily with a meal. For diabetes management 30 tablet 0   . montelukast (SINGULAIR) 10 MG tablet Take 1 tablet (10 mg total) by mouth at bedtime. For Asthma 15 tablet 0   . pantoprazole (PROTONIX) 40 MG tablet Take 1 tablet (40 mg total) by mouth daily. For acid reflux 15 tablet 0   . propranolol (INDERAL) 20 MG tablet Take 1 tablet (20 mg total) by  mouth 2 (two) times daily. For tremors/anxiety 60 tablet 0   . 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   Physician Treatment Plan for Primary Diagnosis: Factitious disorder Long Term Goal(s): Improvement in symptoms so as ready for discharge   Short Term Goals: Ability to demonstrate self-control will improve  Medication Management: Evaluate patient's response, side effects, and tolerance of medication regimen.  Therapeutic Interventions: 1 to 1 sessions, Unit Group sessions and Medication administration.  Evaluation of Outcomes: Progressing  Physician Treatment Plan for Secondary Diagnosis: Principal Problem:   Factitious disorder  Long Term Goal(s): Improvement in symptoms so as ready for discharge   Short Term Goals: Ability to demonstrate self-control will improve     Medication Management: Evaluate patient's response, side effects, and tolerance of medication regimen.  Therapeutic Interventions: 1 to 1 sessions, Unit Group sessions and Medication administration.  Evaluation of Outcomes: Progressing   RN Treatment Plan for Primary Diagnosis: Factitious disorder Long Term Goal(s): Knowledge of disease and therapeutic regimen to maintain health will improve  Short Term Goals: Ability to remain free from injury will improve, Ability to verbalize frustration and anger appropriately will improve, Ability to demonstrate self-control, Ability to participate in decision making will improve, Ability to identify and develop effective coping behaviors will improve and Compliance with prescribed medications will improve  Medication  Management: RN will administer medications as ordered by provider, will assess and evaluate patient's response and provide education to patient for prescribed medication. RN will report any adverse and/or side effects to prescribing provider.  Therapeutic Interventions: 1 on 1 counseling sessions, Psychoeducation, Medication administration, Evaluate responses to treatment, Monitor vital signs and CBGs as ordered, Perform/monitor CIWA, COWS, AIMS and Fall Risk screenings as ordered, Perform wound care treatments as ordered.  Evaluation of Outcomes: Progressing   LCSW Treatment Plan for Primary Diagnosis: Factitious disorder Long Term Goal(s): Safe transition to appropriate next level of care at discharge, Engage patient in therapeutic group addressing interpersonal concerns.  Short Term Goals: Engage patient in aftercare planning with referrals and resources, Increase emotional regulation, Facilitate acceptance of mental health diagnosis and concerns, Facilitate patient progression through stages of change regarding substance  use diagnoses and concerns, Identify triggers associated with mental health/substance abuse issues and Increase skills for wellness and recovery  Therapeutic Interventions: Assess for all discharge needs, 1 to 1 time with Social worker, Explore available resources and support systems, Assess for adequacy in community support network, Educate family and significant other(s) on suicide prevention, Complete Psychosocial Assessment, Interpersonal group therapy.  Evaluation of Outcomes: Progressing   Progress in Treatment: Attending groups: No. Participating in groups: No. Taking medication as prescribed: Yes. Toleration medication: Yes. Family/Significant other contact made: Yes, individual(s) contacted:  Sister in [place] [phone] Patient understands diagnosis: Yes. Discussing patient identified problems/goals with staff: Yes. Medical problems stabilized or  resolved: Yes. Denies suicidal/homicidal ideation: Yes. Issues/concerns per patient self-inventory: No. Other:  New problem(s) identified: No, Describe:  None  New Short Term/Long Term Goal(s): "To work on my behavior being better."  Discharge Plan or Barriers: To return home and follow up with outpatient services or ACTT Team.  Reason for Continuation of Hospitalization: Hallucinations Homicidal ideation Medication stabilization  Estimated Length of Stay:  2 days  Recreational Therapy: Patient Stressors: [name] Patient Goal: Patient will engage in groups without prompting or encouragement from LRT x3 group sessions within 5 recreation therapy group sessions  Attendees: Patient: Tony Cunningham 10/13/2017 11:31 AM  Physician: Corinna Gab, MD 10/13/2017 11:31 AM  Nursing: Leonia Reader, RN 10/13/2017 11:31 AM  RN Care Manager: 10/13/2017 11:31 AM  Social Worker: [name], LCSWA 10/13/2017 11:31 AM  Recreational Therapist: Danella Deis. Dreama Saa, LRT 10/13/2017 11:31 AM  Other: Heidi Dach, LCSW 10/13/2017 11:31 AM  Other: Jake Shark, LCSW 10/13/2017 11:31 AM  Other: 10/13/2017 11:31 AM    Scribe for Treatment Team: [name], LCSW 10/13/2017 11:31 AM

## 2017-10-13 NOTE — [facility] Group Notes (Signed)
10/13/2017 1PM  Type of Therapy/Topic:  Group Therapy:  Emotion Regulation  Participation Level:  Did Not Attend   Description of Group:   The purpose of this group is to assist patients in learning to regulate negative emotions and experience positive emotions. Patients will be guided to discuss ways in which they have been vulnerable to their negative emotions. These vulnerabilities will be juxtaposed with experiences of positive emotions or situations, and patients will be challenged to use positive emotions to combat negative ones. Special emphasis will be placed on coping with negative emotions in conflict situations, and patients will process healthy conflict resolution skills.  Therapeutic Goals: 1. Patient will identify two positive emotions or experiences to reflect on in order to balance out negative emotions 2. Patient will label two or more emotions that they find the most difficult to experience 3. Patient will demonstrate positive conflict resolution skills through discussion and/or role plays  Summary of Patient Progress: Patient was encouraged and invited to attend group. Patient did not attend group. Social worker will continue to encourage group participation in the future.        Therapeutic Modalities:   Cognitive Behavioral Therapy Feelings Identification Dialectical Behavioral Therapy   [name], LCSW 10/13/2017 2:12 PM

## 2017-10-13 NOTE — Progress Notes (Signed)
Patient is alert sitting in the day room watching televison, no distress noted. MHT and security within reach. Nurse will continue to monitor.

## 2017-10-13 NOTE — Progress Notes (Signed)
Patient laying in room asleep, breaths even, no distress noted. MHT and security within reach. Nurse will continue to monitor.

## 2017-10-13 NOTE — Progress Notes (Signed)
patient continues to be on 1:1,no distress noted sitter at bedside will continue to monitor.

## 2017-10-13 NOTE — Progress Notes (Signed)
Recreation Therapy Notes  Date: 10/13/2017  Time: 9:30 am   Location: Craft Room   Behavioral response: [name]   Intervention Topic: Creative Expression  Discussion/Intervention: Patient did not attend group.   Clinical Observations/Feedback:  Patient did not attend group.   Sven Pinheiro LRT/CTRS        Makyia Erxleben 10/13/2017 11:59 AM

## 2017-10-13 NOTE — [facility] Group Notes (Signed)
  LCSW Group Therapy Note  10/12/2017 1:00pm  Type of Therapy/Topic:  Group Therapy:  Feelings about Diagnosis  Participation Level:  Did Not Attend   Description of Group:   This group will allow patients to explore their thoughts and feelings about diagnoses they have received. Patients will be guided to explore their level of understanding and acceptance of these diagnoses. Facilitator will encourage patients to process their thoughts and feelings about the reactions of others to their diagnosis and will guide patients in identifying ways to discuss their diagnosis with significant others in their lives. This group will be process-oriented, with patients participating in exploration of their own experiences, giving and receiving support, and processing challenge from other group members.   Therapeutic Goals: 1. Patient will demonstrate understanding of diagnosis as evidenced by identifying two or more symptoms of the disorder 2. Patient will be able to express two feelings regarding the diagnosis 3. Patient will demonstrate their ability to communicate their needs through discussion and/or role play  Summary of Patient Progress:       Therapeutic Modalities:   Cognitive Behavioral Therapy Brief Therapy Feelings Identification    Collis Thede P Niva Murren, LCSW 10/13/2017 4:01 PM   

## 2017-10-13 NOTE — Progress Notes (Signed)
Patient continues to be on 1:1 no distress no distress noted sitter and security at bedside, respiration even non labored will continue to monitor.

## 2017-10-13 NOTE — Progress Notes (Signed)
1600-1700 Patient sitting in dayroom awaiting blood glucose to be checked. Patient received insulin and evening medication and ate his dinner. There was no distress noted. MHT along with Security noted. RN will continue to monitor.

## 2017-10-13 NOTE — Progress Notes (Signed)
Patient alert, sitting up in day room eating lunch, patient received his insulin injection and stated he was doing fine and did not need anything right now. Patient continues to be on 1:1, MHT within reach. Nurse will continue to monitor.
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
## 2017-10-14 DIAGNOSIS — F250 Schizoaffective disorder, bipolar type: Secondary | ICD-10-CM | POA: Diagnosis not present

## 2017-10-14 DIAGNOSIS — F6810 Factitious disorder, unspecified: Secondary | ICD-10-CM | POA: Diagnosis not present

## 2017-10-14 LAB — GLUCOSE, CAPILLARY
GLUCOSE-CAPILLARY: 163 mg/dL — AB (ref 65–99)
GLUCOSE-CAPILLARY: 244 mg/dL — AB (ref 65–99)
GLUCOSE-CAPILLARY: 224 mg/dL — AB (ref 65–99)
Glucose-Capillary: 317 mg/dL — ABNORMAL HIGH (ref 65–99)

## 2017-10-14 MED ORDER — INSULIN ASPART 100 UNIT/ML ~~LOC~~ SOLN: 5.0000 [IU] | Freq: Three times a day (TID) | SUBCUTANEOUS | 0 refills | Status: AC

## 2017-10-14 MED ORDER — LISINOPRIL 20 MG PO TABS: 20.0000 mg | ORAL_TABLET | Freq: Every day | ORAL | 0 refills | Status: AC

## 2017-10-14 MED ORDER — DIVALPROEX SODIUM 500 MG PO DR TAB: 500.0000 mg | DELAYED_RELEASE_TABLET | Freq: Two times a day (BID) | ORAL | 0 refills | Status: AC

## 2017-10-14 MED ORDER — METFORMIN HCL 1000 MG PO TABS: 1000.0000 mg | ORAL_TABLET | Freq: Two times a day (BID) | ORAL | 0 refills | Status: AC

## 2017-10-14 MED ORDER — PROPRANOLOL HCL 20 MG PO TABS: 20.0000 mg | ORAL_TABLET | Freq: Two times a day (BID) | ORAL | 0 refills | Status: AC

## 2017-10-14 MED ORDER — INSULIN GLARGINE 100 UNIT/ML ~~LOC~~ SOLN: 40.0000 [IU] | Freq: Every day | SUBCUTANEOUS | 0 refills | Status: AC

## 2017-10-14 NOTE — Progress Notes (Signed)
[facility] MD Progress Note  10/14/2017 11:03 AM Tony Cunningham  MRN:  [id number] Subjective:  Pt's team had a stern discussion with  Patient about aggression on the unit. He responded well to this and did not have any aggressive episodes yesterday or through the night. He is actually quite pleasant and happy today. He responded well to positive reinforcement that he did a good job controlling himself. When asked how he was able to control himself, he states, "Staying in my room and talking to people and myself." He states that the "urges were hard."  He went to pet therapy this morning and he was smiling and really enjoying this. He states that he loves dogs and talks about his dog who passed away and really misses him. He becomes tearful through this. He is able to control himself well through this and was very calm. He states that the voices are much less today and quieter. He has not been responding to any internal stimuli and has no delayed responses to questions. He denies SI or thoughts of self harm currently. He asks if we have any video game consoles here so he can play because he enjoys that. He also likes playing connect 4. He is organized and goal directed. HE does not appear manic, psychotic or depressed. HE ate breakfast. Encouraged him to take a shower today and also do some laundry as his clothes are dirty. HE states, "Yeah I can do that today." When asked what he likes about the hospital, he states, "To talk to people" and the fact that the staff takes care of him.    Principal Problem: Factitious disorder Diagnosis:   Patient Active Problem List   Diagnosis Date Noted  . 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 Schizoaffective disorder, depressive type (HCC) [F25.1] 09/23/2016  . Polysubstance abuse (HCC) [F19.10] 07/17/2016  . Tardive dyskinesia [G24.01] 07/16/2016  . Tobacco use disorder [F17.200] 07/07/2016   . Dyslipidemia [E78.5] 07/07/2016  . Asthma [J45.909] 07/07/2016  . HTN (hypertension) [I10] 07/06/2016  . Diabetes (HCC) [E11.9] 12/25/2010   Total Time spent with patient: 30 minutes  Past Psychiatric History: See H&P  Past Medical History:  Past Medical History:  Diagnosis Date  . Anxiety   . Asthma   . Diabetes mellitus   . High blood pressure   . Sinus complaint    History reviewed. No pertinent surgical history. Family History: History reviewed. No pertinent family history. Family Psychiatric  History: See H&P Social History:  Social History   Substance and Sexual Activity  Alcohol Use No     Social History   Substance and Sexual Activity  Drug Use No    Social History   Socioeconomic History  . Marital status: Single    Spouse name: Not on file  . Number of children: Not on file  . Years of education: Not on file  . Highest education level: Not on file  Occupational History  . Not on file  Social Needs  . Financial resource strain: Not on file  . Food insecurity:    Worry: Not on file    Inability: Not on file  . Transportation needs:    Medical: Not on file    Non-medical: Not on file  Tobacco Use  . Smoking status: Current Every Day Smoker    Packs/day: 0.50    Types: Cigarettes  . Smokeless tobacco: Never Used  Substance and Sexual Activity  .  Alcohol use: No  . Drug use: No  . Sexual activity: Not Currently  Lifestyle  . Physical activity:    Days per week: Not on file    Minutes per session: Not on file  . Stress: Not on file  Relationships  . Social connections:    Talks on phone: Not on file    Gets together: Not on file    Attends religious service: Not on file    Active member of club or organization: Not on file    Attends meetings of clubs or organizations: Not on file    Relationship status: Not on file  Other Topics Concern  . Not on file  Social History Narrative  . Not on file   Additional Social History:                          Sleep: Good  Appetite:  Good  Current Medications: Current Facility-Administered Medications  Medication Dose Route Frequency Provider Last Rate Last Dose  . acetaminophen (TYLENOL) tablet 650 mg  650 mg Oral Q6H PRN [name], [name], MD      . alum & mag hydroxide-simeth (MAALOX/MYLANTA) 200-200-20 MG/5ML suspension 30 mL  30 mL Oral Q4H PRN [name], [name], MD      . diphenhydrAMINE (BENADRYL) capsule 50 mg  50 mg Oral Q6H PRN [name], [name], MD       Or  . diphenhydrAMINE (BENADRYL) injection 50 mg  50 mg Intramuscular Q6H PRN [name], [name], MD   50 mg at 10/12/17 2028  . divalproex (DEPAKOTE) DR tablet 500 mg  500 mg Oral Q12H Saydee Zolman, Ileene Hutchinson, MD   500 mg at 10/14/17 0752  . haloperidol (HALDOL) tablet 10 mg  10 mg Oral Q6H PRN [name], [name], MD       Or  . haloperidol lactate (HALDOL) injection 10 mg  10 mg Intramuscular Q6H PRN [name], [name], MD   10 mg at 10/12/17 2027  . hydrOXYzine (ATARAX/VISTARIL) tablet 50 mg  50 mg Oral TID PRN [name], [name], MD      . insulin aspart (novoLOG) injection 0-15 Units  0-15 Units Subcutaneous TID WC [name], [name], MD   11 Units at 10/14/17 0753  . insulin aspart (novoLOG) injection 5 Units  5 Units Subcutaneous TID WC [name], [name], MD   5 Units at 10/14/17 0753  . insulin glargine (LANTUS) injection 40 Units  40 Units Subcutaneous QHS [name], [name], MD   40 Units at 10/13/17 2154  . lisinopril (PRINIVIL,ZESTRIL) tablet 20 mg  20 mg Oral Daily [name], [name], MD   20 mg at 10/14/17 0752  . LORazepam (ATIVAN) tablet 2 mg  2 mg Oral Q6H PRN [name], [name], MD       Or  . LORazepam (ATIVAN) injection 2 mg  2 mg Intramuscular Q6H PRN [name], [name], MD   2 mg at 10/12/17 2030  . magnesium hydroxide (MILK OF MAGNESIA) suspension 30 mL  30 mL Oral Daily PRN [name], [name], MD      . metFORMIN (GLUCOPHAGE) tablet 1,000 mg  1,000 mg Oral BID WC [name], [name], MD   1,000 mg at  10/14/17 0752  . pantoprazole (PROTONIX) EC tablet 40 mg  40 mg Oral Daily [name], [name], MD   40 mg at 10/14/17 0752  . propranolol (INDERAL) tablet 20 mg  20 mg Oral BID [name], [name],  MD   20 mg at 10/14/17 0753  . [START ON 10/17/2017] risperiDONE microspheres (RISPERDAL CONSTA) injection 50 mg  50 mg Intramuscular Q14 Days Claribel Sachs, Ileene Hutchinson, MD      . traZODone (DESYREL) tablet 100 mg  100 mg Oral QHS PRN [name], [name], MD   100 mg at 10/12/17 2101    Lab Results:  Results for orders placed or performed during the hospital encounter of 10/11/17 (from the past 48 hour(s))  Glucose, capillary     Status: None   Collection Time: 10/12/17 11:34 AM  Result Value Ref Range   Glucose-Capillary 73 65 - 99 mg/dL  Glucose, capillary     Status: Abnormal   Collection Time: 10/12/17  4:26 PM  Result Value Ref Range   Glucose-Capillary 205 (H) 65 - 99 mg/dL   Comment 1 Notify RN   Glucose, capillary     Status: Abnormal   Collection Time: 10/12/17  9:14 PM  Result Value Ref Range   Glucose-Capillary 308 (H) 65 - 99 mg/dL  Glucose, capillary     Status: Abnormal   Collection Time: 10/13/17  7:10 AM  Result Value Ref Range   Glucose-Capillary 150 (H) 65 - 99 mg/dL   Comment 1 Notify RN   Glucose, capillary     Status: Abnormal   Collection Time: 10/13/17 11:44 AM  Result Value Ref Range   Glucose-Capillary 122 (H) 65 - 99 mg/dL   Comment 1 Notify RN   Glucose, capillary     Status: Abnormal   Collection Time: 10/13/17  4:26 PM  Result Value Ref Range   Glucose-Capillary 180 (H) 65 - 99 mg/dL  Glucose, capillary     Status: Abnormal   Collection Time: 10/13/17  8:13 PM  Result Value Ref Range   Glucose-Capillary 239 (H) 65 - 99 mg/dL  Glucose, capillary     Status: Abnormal   Collection Time: 10/14/17  7:06 AM  Result Value Ref Range   Glucose-Capillary 317 (H) 65 - 99 mg/dL   Comment 1 Notify RN     Blood Alcohol level:  Lab Results  Component Value Date   ETH <5 01/16/2017    ETH <5 12/21/2016    Metabolic Disorder Labs: Lab Results  Component Value Date   HGBA1C 7.9 (H) 10/12/2017   MPG 180.03 10/12/2017   MPG 171.42 01/19/2017   Lab Results  Component Value Date   PROLACTIN 24.5 (H) 09/24/2016   PROLACTIN 3.4 (L) 07/07/2016   Lab Results  Component Value Date   CHOL 151 10/12/2017   TRIG 122 10/12/2017   HDL 47 10/12/2017   CHOLHDL 3.2 10/12/2017   VLDL 24 10/12/2017   LDLCALC 80 10/12/2017   LDLCALC 55 01/19/2017    Physical Findings: AIMS:  , ,  ,  ,    CIWA:    COWS:     Musculoskeletal: Strength & Muscle Tone: within normal limits Gait & Station: normal Patient leans: [name]  Psychiatric Specialty Exam: Physical Exam  Nursing note and vitals reviewed.   Review of Systems  All other systems reviewed and are negative.   Blood pressure 113/79, pulse 91, temperature 98.2 F (36.8 C), temperature source Oral, resp. rate 16, height 6' (1.829 m), weight 68 kg (150 lb), SpO2 100 %.Body mass index is 20.34 kg/m.  General Appearance: Disheveled  Eye Contact:  Fair  Speech:  Slow  Volume:  Normal  Mood:  Euthymic  Affect:  Appropriate  Thought Process:  Coherent and  Goal Directed  Orientation:  Full (Time, Place, and Person)  Thought Content:  Hallucinations: Auditory  Suicidal Thoughts:  No  Homicidal Thoughts:  No  Memory:  Immediate;   Fair  Judgement:  Impaired  Insight:  Lacking  Psychomotor Activity:  Normal  Concentration:  Concentration: Fair  Recall:  [employer] of Knowledge:  Fair  Language:  Fair  Akathisia:  No      Assets:  Resilience  ADL's:  Intact  Cognition:  WNL  Sleep:  Number of Hours: 7     Treatment Plan Summary: 42 yo male admitted due to endorsing AH, VH, SI, HI and agitation. Pt responded well to discussion about any aggression will lead to discharge. He did not have any episodes of aggression or outburst after this. This further corroborates the fact that his behaviors of acting out are a  learned, extremely maladaptive behavior for him gain longer admissions and if discharge is threatened he is able to control himself. He has been organized and goal directed through hospitalization so far with no evidence of hallucinations that he endorses. Will likely discharge tomorrow as long term hospitalizations will foster this dependence on hospitalizations and has potential to increase aggression as he is using this behavior to stay in the hospital. There is a general consensus, which I agree with, that he does not have psychotic or mood disorder and likely does not require anti-psychotics. He has been trialed on many and none of them have helped with behaviors or aggression. He was started on Depakote to help with some mood stabilization. He is tolerating it so far.   Plan:  Factitious disorder, aggression -Continue Depakote 500 mg BID -He was given Risperdal Consta 50 mg in ED on 5/12. Will not recommend continuing this as he likely does not have psychotic disorder  HTN -Lisinopril 20 mg Daily -Propranolol 20 mg BID, This can also help with agitation  DM -Insulin  Dispo -He will return to mother's on discharge. He was referred to ACTT team but they do not have openings at this time. He will follow up with [facility] in the meantime   .Haskell Riling, MD 10/14/2017, 11:03 AM

## 2017-10-14 NOTE — Plan of Care (Signed)
Patient is alert and oriented to self, place and time. Affect is pleasant and mood is calm. Ambulatory on the unit today with a steady gait. Patient smiles upon approach. Denies SI/HI and when asked about hearing voices patient stated, "No not right now, if I do I will come get you." Compliant with meals and medications, attended pet therapy today and thoroughly enjoyed it, states, "I use to have a dog but he died years ago, I like dogs." Milieu remains safe with q 15 minute safety checks. Will continue to monitor.

## 2017-10-14 NOTE — BHH Group Notes (Signed)
  10/14/2017  Time: 1PM  Type of Therapy/Topic:  Group Therapy:  Balance in Life  Participation Level:  None  Description of Group:   This group will address the concept of balance and how it feels and looks when one is unbalanced. Patients will be encouraged to process areas in their lives that are out of balance and identify reasons for remaining unbalanced. Facilitators will guide patients in utilizing problem-solving interventions to address and correct the stressor making their life unbalanced. Understanding and applying boundaries will be explored and addressed for obtaining and maintaining a balanced life. Patients will be encouraged to explore ways to assertively make their unbalanced needs known to significant others in their lives, using other group members and facilitator for support and feedback.  Therapeutic Goals: 1. Patient will identify two or more emotions or situations they have that consume much of in their lives. 2. Patient will identify signs/triggers that life has become out of balance:  3. Patient will identify two ways to set boundaries in order to achieve balance in their lives:  4. Patient will demonstrate ability to communicate their needs through discussion and/or role plays  Summary of Patient Progress: Pt attended group but did not participate in group discussion. Pt arrived to group for the last fifteen minutes. When prompted by CSW, pt reported he would like to focus more on his mental health. Pt did not contribute further to group discussion.    Therapeutic Modalities:   Cognitive Behavioral Therapy Solution-Focused Therapy Assertiveness Training  Heidi Dach, MSW, LCSW Clinical Social Worker 10/14/2017 1:57 PM

## 2017-10-14 NOTE — Plan of Care (Signed)
Pt out constantly walking around the unit. Pt stated he was hearing voices saying, "Hurt him". Gave patient a dose of haldol 10 mg at  2019. Pt denies SI/HI. Pt is receptive to treatment and safety maintained on unit. Will continue to monitor. Problem: Education: Goal: Knowledge of [facility] Education information/materials will improve Outcome: Progressing   Problem: Health Behavior/Discharge Planning: Goal: Identification of resources available to assist in meeting health care needs will improve Outcome: Progressing Goal: Compliance with treatment plan for underlying cause of condition will improve Outcome: Progressing   Problem: Activity: Goal: Will identify at least one activity in which they can participate Outcome: Progressing   Problem: Coping: Goal: Ability to use eye contact when communicating with others will improve Outcome: Progressing   Problem: Self-Concept: Goal: Will verbalize positive feelings about self Outcome: Progressing

## 2017-10-14 NOTE — Progress Notes (Signed)
Recreation Therapy Notes    Date: 10/14/2017  Time: 9:30 am  Location: Craft Room  Behavioral response: Appropriate  Intervention Topic: Animal Assisted Therapy  Discussion/Intervention:  Patient participated in Animal Assisted Therapy during group today. Group facilitator defined Animal Assisted Therapy as is the use of animals as a therapeutic tool to assist a person in restoring balance to their life.  The group facilitator also described the benefits of Animal Assisted Therapy as improving patients' mental, physical, social and emotional functioning with the aid of animals; depending on the needs of the patient. Individuals in the group were able to pet the dogs as well as ask questions. Clinical Observations/Feedback:  Patient came to group and was focused on the group topic. He engaged in group with peers and staff and had excellent behavior. Individual asked on topic questions and kept peers on topic.  Kishon Garriga LRT/CTRS         Nicklaus Alviar 10/14/2017 10:40 AM

## 2017-10-14 NOTE — BHH Group Notes (Signed)
BHH Group Notes:  (Nursing/MHT/Case Management/Adjunct)  Date:  10/14/2017  Time:  9:59 PM  Type of Therapy:  Group Therapy  Participation Level:  Active  Participation Quality:  Appropriate  Affect:  Appropriate  Cognitive:  Alert  Insight:  Good  Engagement in Group:  Engaged  Modes of Intervention:  Support  Summary of Progress/Problems:  Tony Cunningham 10/14/2017, 9:59 PM

## 2017-10-14 NOTE — BHH Counselor (Signed)
CSW spoke with the patients sister-in-law [name] and she reports that she can come and pick the patient up tomorrow at 10:30am.   [name], MSW, LCSWA, LCASA 10/14/2017 3:06 PM

## 2017-10-14 NOTE — Plan of Care (Signed)
Patient is off !:1 and complying with unit safety, contract for safety of self and others, take his medicines and able to verbalize positive attributes of self, teach patient safety and 15 minute rounding is maintained, patient denies any SI/HI no distress noted. Problem: Education: Goal: Knowledge of [facility] Education information/materials will improve Outcome: Progressing   Problem: Health Behavior/Discharge Planning: Goal: Identification of resources available to assist in meeting health care needs will improve Outcome: Progressing Goal: Compliance with treatment plan for underlying cause of condition will improve Outcome: Progressing   Problem: Activity: Goal: Will identify at least one activity in which they can participate Outcome: Progressing   Problem: Coping: Goal: Ability to identify and develop effective coping behavior will improve Outcome: Progressing Goal: Ability to interact with others will improve Outcome: Progressing Goal: Demonstration of participation in decision-making regarding own care will improve Outcome: Progressing Goal: Ability to use eye contact when communicating with others will improve Outcome: Progressing   Problem: Self-Concept: Goal: Will verbalize positive feelings about self Outcome: Progressing

## 2017-10-14 NOTE — BHH Group Notes (Signed)
BHH Group Notes:  (Nursing/MHT/Case Management/Adjunct)  Date:  10/14/2017  Time:  1:08 AM  Type of Therapy:  Group Therapy  Participation Level:  Active  Participation Quality:  Appropriate  Affect:  Appropriate  Cognitive:  Appropriate  Insight:  Appropriate  Engagement in Group:  Engaged  Modes of Intervention:  Discussion  Summary of Progress/Problems:  Burt Ek 10/14/2017, 1:08 AM

## 2017-10-14 NOTE — BHH Group Notes (Signed)
LCSW Group Therapy Note 10/14/2017 9:00 AM  Type of Therapy and Topic:  Group Therapy:  Setting Goals  Participation Level:  Minimal  Description of Group: In this process group, patients discussed using strengths to work toward goals and address challenges.  Patients identified two positive things about themselves and one goal they were working on.  Patients were given the opportunity to share openly and support each other's plan for self-empowerment.  The group discussed the value of gratitude and were encouraged to have a daily reflection of positive characteristics or circumstances.  Patients were encouraged to identify a plan to utilize their strengths to work on current challenges and goals.  Therapeutic Goals 1. Patient will verbalize personal strengths/positive qualities and relate how these can assist with achieving desired personal goals 2. Patients will verbalize affirmation of peers plans for personal change and goal setting 3. Patients will explore the value of gratitude and positive focus as related to successful achievement of goals 4. Patients will verbalize a plan for regular reinforcement of personal positive qualities and circumstances.  Summary of Patient Progress:  Tony Cunningham participated some in today's group discussion on setting goals using the SMART model.  Tony Cunningham presented with appropriate mood and affect.  Tony Cunningham shared that his goal for today is "stay awake" and was able to be more specific in his goal as "walk around more;  Interact with others more; go to the rec room and stay out of my room".  Tony Cunningham shared that he had a good understanding of how he can use the SMART model to establish goals for himself when he is discharged from the hospital and go back into his community.     Therapeutic Modalities Cognitive Behavioral Therapy Motivational Interviewing    Alease Frame, [place] 10/14/2017 11:22 AM

## 2017-10-15 DIAGNOSIS — F6810 Factitious disorder, unspecified: Secondary | ICD-10-CM | POA: Diagnosis not present

## 2017-10-15 DIAGNOSIS — F250 Schizoaffective disorder, bipolar type: Secondary | ICD-10-CM | POA: Diagnosis not present

## 2017-10-15 LAB — GLUCOSE, CAPILLARY
GLUCOSE-CAPILLARY: 267 mg/dL — AB (ref 65–99)
Glucose-Capillary: 109 mg/dL — ABNORMAL HIGH (ref 65–99)
Glucose-Capillary: 314 mg/dL — ABNORMAL HIGH (ref 65–99)

## 2017-10-15 MED ORDER — HALOPERIDOL 5 MG PO TABS
5.0000 mg | ORAL_TABLET | Freq: Once | ORAL | Status: AC
  Administered 2017-10-15: 5 mg via ORAL
  Filled 2017-10-15: qty 1

## 2017-10-15 NOTE — Progress Notes (Addendum)
Recreation Therapy Notes  Date: 10/15/2017  Time: 9:30 am  Location: Craft Room  Behavioral response: Appropriate   Intervention Topic: Leisure  Discussion/Intervention:  Group content on today was focused on coping skills. The group defined what coping skills are and when they can be used. Individuals described how they normally cope with things and the coping skills they normally use. Patients expressed why it is important to cope with things and how not coping with things can affect you. The group participated in the intervention "Exploring coping skills" where they had a chance to test new coping skills they could use in the future.  Clinical Observations/Feedback:  Patient came to group and stated leisure keeps his mind busy. Individual participated in the intervention and was social with peers and staff during group. Anuj Summons LRT/CTRS        Mckinzy Fuller 10/15/2017 11:57 AM

## 2017-10-15 NOTE — Progress Notes (Signed)
Patient denies SI/HI, denies A/V hallucinations. Patient verbalizes understanding of discharge instructions, follow up care and prescriptions. Patient given all belongings from  [place].Discharge instructions explained to family also,verbalized understanding. Patient escorted out by staff, transported by family.

## 2017-10-15 NOTE — BHH Suicide Risk Assessment (Signed)
[facility] Discharge Suicide Risk Assessment   Principal Problem: Factitious disorder Discharge Diagnoses:  Patient Active Problem List   Diagnosis Date Noted  . 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 Schizoaffective disorder, depressive type (HCC) [F25.1] 09/23/2016  . Polysubstance abuse (HCC) [F19.10] 07/17/2016  . Tardive dyskinesia [G24.01] 07/16/2016  . Tobacco use disorder [F17.200] 07/07/2016  . Dyslipidemia [E78.5] 07/07/2016  . Asthma [J45.909] 07/07/2016  . HTN (hypertension) [I10] 07/06/2016  . Diabetes (HCC) [E11.9] 12/25/2010    Mental Status Per Nursing Assessment::   On Admission:  Thoughts of violence towards others  Demographic Factors:  Male and Unemployed  Loss Factors: Legal issues  Historical Factors: Impulsivity  Risk Reduction Factors:   Living with another person, especially a relative  Continued Clinical Symptoms:  Personality Disorders:   Cluster B, borderline low Intellectual functioning, low adaptive functioning  Cognitive Features That Contribute To Risk:  Polarized thinking    Suicide Risk:  Mild:  Suicidal ideation of limited frequency, intensity, duration, and specificity.  There are no identifiable plans, no associated intent, mild dysphoria and related symptoms, good self-control (both objective and subjective assessment), few other risk factors, and identifiable protective factors, including available and accessible social support.  Follow-up Information    [facility] Follow up.   Why:  Please follow up with this ACTT Team towards the end of June 2019 to inquire about [facility]. The referral has alreaday been sent off. Thank you. Contact information: [address] [place] [address] [phone]        Inc, [facility]. Go on 10/19/2017.   Why:  Please follow up with [facility] on Tuesday  Oct 19, 2017 at 1pm. Please bring your identification, social security card and insurance information. Thank you.  Contact information: [address] [place] [address] [phone]           Plan Of Care/Follow-up recommendations: See above Haskell Riling, MD 10/15/2017, 10:53 AM

## 2017-10-15 NOTE — Progress Notes (Signed)
  [facility] Adult Case Management Discharge Plan :  Will you be returning to the same living situation after discharge:  Yes,  To live with mother At discharge, do you have transportation home?: Yes,  Family will come at discharge Do you have the ability to pay for your medications: Yes,  Medicare  Release of information consent forms completed and in the chart;  Patient's signature needed at discharge.  Patient to Follow up at: Follow-up Information    [facility] Follow up.   Why:  Please follow up with this ACTT Team towards the end of June 2019 to inquire about [facility]. The referral has alreaday been sent off. Thank you. Contact information: [address] [place] [address] [phone]        Inc, [facility]. Go on 10/19/2017.   Why:  Please follow up with [facility] on Tuesday Oct 19, 2017 at 1pm. Please bring your identification, social security card and insurance information. Thank you.  Contact information: [address] [place] [address] [phone]           Next level of care provider has access to [facility] Link:no  Safety Planning and Suicide Prevention discussed: Yes,  Pts. sister-in-law [name]  Have you used any form of tobacco in the last 30 days? (Cigarettes, Smokeless Tobacco, Cigars, and/or Pipes): No  Has patient been referred to the Quitline?: [name] patient is not a smoker  Patient has been referred for addiction treatment: [name]  [name], LCSW 10/15/2017, 9:55 AM

## 2017-10-15 NOTE — Progress Notes (Signed)
Recreation Therapy Notes  INPATIENT RECREATION TR PLAN  Patient Details Name: Tony Cunningham MRN: [id number] DOB: 1975/10/26 Today's Date: 10/15/2017  Rec Therapy Plan Is patient appropriate for Therapeutic Recreation?: Yes Treatment times per week: at least 3 Estimated Length of Stay: 5-7 days TR Treatment/Interventions: Group participation (Comment)  Discharge Criteria Pt will be discharged from therapy if:: Discharged Treatment plan/goals/alternatives discussed and agreed upon by:: Patient/family  Discharge Summary Short term goals set: Patient will engage in groups without prompting or encouragement from LRT x3 group sessions within 5 recreation therapy group sessions Short term goals met: Adequate for discharge Progress toward goals comments: Groups attended Which groups?: Leisure education, AAA/T Reason goals not met: [name] Therapeutic equipment acquired: [name] Reason patient discharged from therapy: Discharge from hospital Pt/family agrees with progress & goals achieved: Yes Date patient discharged from therapy: 10/22/17   [name] 10/15/2017, 4:05 PM

## 2017-10-15 NOTE — BHH Group Notes (Signed)
10/15/2017 1PM  Type of Therapy and Topic:  Group Therapy:  Feelings around Relapse and Recovery  Participation Level:  Active   Description of Group:    Patients in this group will discuss emotions they experience before and after a relapse. They will process how experiencing these feelings, or avoidance of experiencing them, relates to having a relapse. Facilitator will guide patients to explore emotions they have related to recovery. Patients will be encouraged to process which emotions are more powerful. They will be guided to discuss the emotional reaction significant others in their lives may have to patients' relapse or recovery. Patients will be assisted in exploring ways to respond to the emotions of others without this contributing to a relapse.  Therapeutic Goals: 1. Patient will identify two or more emotions that lead to a relapse for them 2. Patient will identify two emotions that result when they relapse 3. Patient will identify two emotions related to recovery 4. Patient will demonstrate ability to communicate their needs through discussion and/or role plays   Summary of Patient Progress: Actively and appropriately engaged in the group.. Patient practiced active listening when interacting with the facilitator and other group members. Pt. Reports that he uses playing video games as a coping skill that he can use to precent relapse in the future. Patient in still in the process of obtaining treatment goals.      Therapeutic Modalities:   Cognitive Behavioral Therapy Solution-Focused Therapy Assertiveness Training Relapse Prevention Therapy   [name], [place] 10/15/2017 3:33 PM
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
## 2017-10-15 NOTE — Progress Notes (Signed)
Inpatient Diabetes Program Recommendations  AACE/ADA: New Consensus Statement on Inpatient Glycemic Control (2015)  Target Ranges:  Prepandial:   less than 140 mg/dL      Peak postprandial:   less than 180 mg/dL (1-2 hours)      Critically ill patients:  140 - 180 mg/dL   Lab Results  Component Value Date   GLUCAP 267 (H) 10/15/2017   HGBA1C 7.9 (H) 10/12/2017    Review of Glycemic ControlResults for INAKI, VANTINE (MRN [id number]) as of 10/15/2017 10:47  Ref. Range 10/14/2017 07:06 10/14/2017 11:45 10/14/2017 16:15 10/14/2017 20:22 10/15/2017 07:02  Glucose-Capillary Latest Ref Range: 65 - 99 mg/dL 935 (H) 701 (H) 779 (H) 224 (H) 267 (H)    Diabetes history: Type 2 DM Outpatient Diabetes medications:  Lantus 40 units q HS, Novolog 5 units tid with meals, Glucophage 1000 mg bid Current orders for Inpatient glycemic control:  Novolog moderate tid with meals, Novolog 5 units tid with meals, Lantus 40 units q HS, Metformin 1000 mg bid  Inpatient Diabetes Program Recommendations:   Please consider increasing Lantus to 45 units q HS.   Thanks,  Beryl Meager, RN, BC-ADM Inpatient Diabetes Coordinator Pager [phone] (8a-5p)

## 2017-11-03 ENCOUNTER — Other Ambulatory Visit: Payer: Self-pay

## 2017-11-03 ENCOUNTER — Emergency Department (HOSPITAL_COMMUNITY)
Admission: EM | Admit: 2017-11-03 | Discharge: 2017-11-03 | Disposition: A | Discharge status: Home / Self Care | Payer: Medicare Other | Attending: Emergency Medicine | Admitting: Emergency Medicine

## 2017-11-03 ENCOUNTER — Encounter (HOSPITAL_COMMUNITY): Payer: Self-pay | Admitting: Emergency Medicine

## 2017-11-03 DIAGNOSIS — Z79899 Other long term (current) drug therapy: Secondary | ICD-10-CM | POA: Insufficient documentation

## 2017-11-03 DIAGNOSIS — F17210 Nicotine dependence, cigarettes, uncomplicated: Secondary | ICD-10-CM | POA: Diagnosis not present

## 2017-11-03 DIAGNOSIS — F209 Schizophrenia, unspecified: Secondary | ICD-10-CM | POA: Diagnosis not present

## 2017-11-03 DIAGNOSIS — R739 Hyperglycemia, unspecified: Secondary | ICD-10-CM

## 2017-11-03 DIAGNOSIS — I10 Essential (primary) hypertension: Secondary | ICD-10-CM | POA: Insufficient documentation

## 2017-11-03 DIAGNOSIS — J45909 Unspecified asthma, uncomplicated: Secondary | ICD-10-CM | POA: Insufficient documentation

## 2017-11-03 DIAGNOSIS — Z794 Long term (current) use of insulin: Secondary | ICD-10-CM | POA: Diagnosis not present

## 2017-11-03 DIAGNOSIS — E1165 Type 2 diabetes mellitus with hyperglycemia: Secondary | ICD-10-CM | POA: Diagnosis not present

## 2017-11-03 DIAGNOSIS — R456 Violent behavior: Secondary | ICD-10-CM | POA: Diagnosis present

## 2017-11-03 LAB — CBC
HEMATOCRIT: 39.2 % (ref 39.0–52.0)
Hemoglobin: 13.2 g/dL (ref 13.0–17.0)
MCH: 29.3 pg (ref 26.0–34.0)
MCHC: 33.7 g/dL (ref 30.0–36.0)
MCV: 87.1 fL (ref 78.0–100.0)
Platelets: 229 10*3/uL (ref 150–400)
RBC: 4.5 MIL/uL (ref 4.22–5.81)
RDW: 13.2 % (ref 11.5–15.5)
WBC: 6.6 10*3/uL (ref 4.0–10.5)

## 2017-11-03 LAB — I-STAT CHEM 8, ED
BUN: 11 mg/dL (ref 6–20)
CALCIUM ION: 1.06 mmol/L — AB (ref 1.15–1.40)
CREATININE: 0.6 mg/dL — AB (ref 0.61–1.24)
Chloride: 96 mmol/L — ABNORMAL LOW (ref 101–111)
GLUCOSE: 341 mg/dL — AB (ref 65–99)
HCT: 36 % — ABNORMAL LOW (ref 39.0–52.0)
Hemoglobin: 12.2 g/dL — ABNORMAL LOW (ref 13.0–17.0)
Potassium: 4.6 mmol/L (ref 3.5–5.1)
SODIUM: 133 mmol/L — AB (ref 135–145)
TCO2: 25 mmol/L (ref 22–32)

## 2017-11-03 LAB — COMPREHENSIVE METABOLIC PANEL
ALT: 110 U/L — ABNORMAL HIGH (ref 17–63)
AST: 40 U/L (ref 15–41)
Albumin: 3.2 g/dL — ABNORMAL LOW (ref 3.5–5.0)
Alkaline Phosphatase: 104 U/L (ref 38–126)
Anion gap: 12 (ref 5–15)
BUN: 13 mg/dL (ref 6–20)
CO2: 27 mmol/L (ref 22–32)
Calcium: 8.7 mg/dL — ABNORMAL LOW (ref 8.9–10.3)
Chloride: 92 mmol/L — ABNORMAL LOW (ref 101–111)
Creatinine, Ser: 0.87 mg/dL (ref 0.61–1.24)
GFR calc Af Amer: >60 mL/min (ref >60)
GFR calc non Af Amer: >60 mL/min (ref >60)
Glucose, Bld: 500 mg/dL — ABNORMAL HIGH (ref 65–99)
Potassium: 5.9 mmol/L — ABNORMAL HIGH (ref 3.5–5.1)
Sodium: 131 mmol/L — ABNORMAL LOW (ref 135–145)
Total Bilirubin: 0.6 mg/dL (ref 0.3–1.2)
Total Protein: 6.1 g/dL — ABNORMAL LOW (ref 6.5–8.1)

## 2017-11-03 LAB — RAPID URINE DRUG SCREEN, HOSP PERFORMED
Amphetamines: NOT DETECTED
BARBITURATES: NOT DETECTED
BENZODIAZEPINES: NOT DETECTED
Cocaine: NOT DETECTED
Opiates: NOT DETECTED
Tetrahydrocannabinol: NOT DETECTED

## 2017-11-03 LAB — ACETAMINOPHEN LEVEL: Acetaminophen (Tylenol), Serum: <10 ug/mL — ABNORMAL LOW (ref 10–30)

## 2017-11-03 LAB — ETHANOL

## 2017-11-03 LAB — CBG MONITORING, ED
GLUCOSE-CAPILLARY: 531 mg/dL — AB (ref 65–99)
Glucose-Capillary: 333 mg/dL — ABNORMAL HIGH (ref 65–99)
Glucose-Capillary: 292 mg/dL — ABNORMAL HIGH (ref 65–99)
Glucose-Capillary: 237 mg/dL — ABNORMAL HIGH (ref 65–99)

## 2017-11-03 LAB — SALICYLATE LEVEL: Salicylate Lvl: <7 mg/dL (ref 2.8–30.0)

## 2017-11-03 MED ORDER — LISINOPRIL 20 MG PO TABS
20.0000 mg | ORAL_TABLET | Freq: Every day | ORAL | Status: DC
  Administered 2017-11-03: 20 mg via ORAL
  Filled 2017-11-03: qty 1

## 2017-11-03 MED ORDER — MONTELUKAST SODIUM 10 MG PO TABS: 10.0000 mg | ORAL_TABLET | Freq: Every day | ORAL | Status: DC

## 2017-11-03 MED ORDER — PANTOPRAZOLE SODIUM 40 MG PO TBEC
40.0000 mg | DELAYED_RELEASE_TABLET | Freq: Every day | ORAL | Status: DC
  Administered 2017-11-03: 40 mg via ORAL
  Filled 2017-11-03: qty 1

## 2017-11-03 MED ORDER — METFORMIN HCL 500 MG PO TABS
1000.0000 mg | ORAL_TABLET | Freq: Two times a day (BID) | ORAL | Status: DC
  Administered 2017-11-03 (×2): 1000 mg via ORAL
  Filled 2017-11-03 (×2): qty 2

## 2017-11-03 MED ORDER — DIVALPROEX SODIUM 250 MG PO DR TAB
500.0000 mg | DELAYED_RELEASE_TABLET | Freq: Two times a day (BID) | ORAL | Status: DC
  Administered 2017-11-03: 500 mg via ORAL
  Filled 2017-11-03: qty 2

## 2017-11-03 MED ORDER — INSULIN GLARGINE 100 UNIT/ML ~~LOC~~ SOLN
40.0000 [IU] | Freq: Every day | SUBCUTANEOUS | Status: DC
  Administered 2017-11-03: 40 [IU] via SUBCUTANEOUS
  Filled 2017-11-03 (×2): qty 0.4

## 2017-11-03 MED ORDER — INSULIN ASPART 100 UNIT/ML ~~LOC~~ SOLN
0.0000 [IU] | Freq: Three times a day (TID) | SUBCUTANEOUS | Status: DC
  Administered 2017-11-03: 3 [IU] via SUBCUTANEOUS
  Administered 2017-11-03: 10 [IU] via SUBCUTANEOUS
  Filled 2017-11-03 (×2): qty 1

## 2017-11-03 MED ORDER — PROPRANOLOL HCL 40 MG PO TABS
20.0000 mg | ORAL_TABLET | Freq: Two times a day (BID) | ORAL | Status: DC
  Administered 2017-11-03: 20 mg via ORAL
  Filled 2017-11-03: qty 1

## 2017-11-03 NOTE — ED Notes (Addendum)
SW in w/pt. Pt w/no shoes so cannot ride the bus. Pt also does not have any clothing - neighbor took them when brought him to ED.

## 2017-11-03 NOTE — ED Notes (Signed)
Carb Modified Diet ordered for Lunch. 

## 2017-11-03 NOTE — ED Notes (Signed)
Patient was given [place] Cloud and a Sprite.

## 2017-11-03 NOTE — ED Notes (Signed)
Mother is going home, [employer] [place] 0 [phone].

## 2017-11-03 NOTE — ED Notes (Addendum)
Dr Fayrene Fearing aware pt's CBG 531 and pt ate graham crackers 1 hours ago - order received to administer Novolog 10 units and Lantus 40 units now d/t pt did not receive last night's dose and recheck in 1 hour.
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
## 2017-11-03 NOTE — ED Notes (Signed)
Telepsych being performed. 
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
## 2017-11-03 NOTE — ED Notes (Signed)
TTS device at bedside. Assessment underway.  

## 2017-11-03 NOTE — ED Notes (Signed)
Pt voiced understanding of Medical Clearance Pt Policy form - copy given.

## 2017-11-03 NOTE — ED Notes (Signed)
Pt attempted to call family member about pick up. No answer.

## 2017-11-03 NOTE — ED Notes (Addendum)
Regular diet tray ordered for breakfast at [phone]
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
## 2017-11-03 NOTE — Discharge Instructions (Signed)
Make sure that you are taking all of your medicines as directed.  Also, stay on a low carbohydrate diet and drink 1 to 2 L of water each day.  Do not drink any fluids or eat any foods which contain a lot of sugar.  It is important to follow-up with your primary care provider for a checkup in 1 or 2 weeks.

## 2017-11-03 NOTE — ED Provider Notes (Signed)
1:05 PM-I discussed the case with TTS, who feel that the patient is psychiatrically cleared and stable for discharge.  She is plans on sending information for follow-up for him.  1:15 PM-patient with persistent CBG elevation, will recheck chemistry panel, with i-STAT 8.  Potassium was elevated initially.  Patient apparently did not get his nighttime insulin yesterday, and it was given to him about 45 minutes ago.  After missing metformin it was given to him 2 hours ago.   3:05 PM-labs repeated i-STAT 8 shows improvement with normal potassium and decreasing glucose.  After discussion with nurse, it appears that the patient was not on a low carbohydrate diet until lunchtime today.  This in the lack of medication administration last evening probably contributed to his hyperglycemia.  He is metabolically stable, has access to his medications, and plans to take them.  Patient is currently comfortable and ready to go home.  He should has help at his home.  The patient is comfortable at this time.  He states that he has his medicines at home.  He states that he needs help getting a ride home.   Mancel Bale, MD 11/03/17 [phone]

## 2017-11-03 NOTE — ED Notes (Signed)
Patient Family member took belongings home

## 2017-11-03 NOTE — ED Provider Notes (Signed)
[facility] EMERGENCY DEPARTMENT Provider Note   CSN: [id number] Arrival date & time: 11/03/17  0022     History   Chief Complaint Chief Complaint  Patient presents with  . Schizophrenia  . Aggressive Behavior    HPI Tony Cunningham is a 42 y.o. male.  Patient is a 42 year old male with past medical history of schizophrenia.  He presents today with complaints of hearing voices and increasingly aggressive behavior.  This is been worsening "for quite some time".  He has been seen at [facility], however discharged.  He denies any suicidal or homicidal ideation.  He adds little history as he is somewhat uncooperative.  The history is provided by the patient.    Past Medical History:  Diagnosis Date  . Anxiety   . Asthma   . Diabetes mellitus   . High blood pressure   . Sinus complaint     Patient Active Problem List   Diagnosis Date Noted  . 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 Polysubstance abuse (HCC) 07/17/2016  . Tardive dyskinesia 07/16/2016  . Tobacco use disorder 07/07/2016  . Dyslipidemia 07/07/2016  . Asthma 07/07/2016  . HTN (hypertension) 07/06/2016  . Diabetes (HCC) 12/25/2010    History reviewed. No pertinent surgical history.      Home Medications    Prior to Admission medications   Medication Sig Start Date End Date Taking? Authorizing Provider  divalproex (DEPAKOTE) 500 MG DR tablet Take 1 tablet (500 mg total) by mouth every 12 (twelve) hours. 10/14/17   McNew, Ileene Hutchinson, MD  insulin aspart (NOVOLOG) 100 UNIT/ML injection Inject 5 Units into the skin 3 (three) times daily with meals. For diabetes management 10/14/17   McNew, Ileene Hutchinson, MD  insulin glargine (LANTUS) 100 UNIT/ML injection Inject 0.4 mLs (40 Units total) into the skin at bedtime. For diabetes management 10/14/17   McNew, Ileene Hutchinson, MD  lisinopril (PRINIVIL,ZESTRIL) 20 MG tablet Take 1 tablet (20 mg total) by mouth daily.  For high blood pressure 10/14/17   McNew, Ileene Hutchinson, MD  metFORMIN (GLUCOPHAGE) 1000 MG tablet Take 1 tablet (1,000 mg total) by mouth 2 (two) times daily with a meal. For diabetes management 10/14/17   McNew, Ileene Hutchinson, MD  montelukast (SINGULAIR) 10 MG tablet Take 1 tablet (10 mg total) by mouth at bedtime. For Asthma 03/05/17   Armandina Stammer I, NP  pantoprazole (PROTONIX) 40 MG tablet Take 1 tablet (40 mg total) by mouth daily. For acid reflux 03/06/17   Armandina Stammer I, NP  propranolol (INDERAL) 20 MG tablet Take 1 tablet (20 mg total) by mouth 2 (two) times daily. For tremors/anxiety 10/14/17   Haskell Riling, MD    Family History No family history on file.  Social History Social History   Tobacco Use  . Smoking status: Current Every Day Smoker    Packs/day: 0.50    Types: Cigarettes  . Smokeless tobacco: Never Used  Substance Use Topics  . Alcohol use: No  . Drug use: No     Allergies   Seroquel [quetiapine fumarate]   Review of Systems Review of Systems  All other systems reviewed and are negative.    Physical Exam Updated Vital Signs BP 130/83 (BP Location: Right Arm)   Pulse 88   Temp 98.3 F (36.8 C) (Oral)   Resp 18   SpO2 100%   Physical Exam  Constitutional: He is oriented to person, place, and time. He appears well-developed and  well-nourished. No distress.  HENT:  Head: Normocephalic and atraumatic.  Mouth/Throat: Oropharynx is clear and moist.  Neck: Normal range of motion. Neck supple.  Cardiovascular: Normal rate and regular rhythm. Exam reveals no friction rub.  No murmur heard. Pulmonary/Chest: Effort normal and breath sounds normal. No respiratory distress. He has no wheezes. He has no rales.  Abdominal: Soft. Bowel sounds are normal. He exhibits no distension. There is no tenderness.  Musculoskeletal: Normal range of motion. He exhibits no edema.  Neurological: He is alert and oriented to person, place, and time. Coordination normal.  Skin: Skin is  warm and dry. He is not diaphoretic.  Psychiatric: His affect is labile. His speech is rapid and/or pressured. He is agitated. He expresses no homicidal and no suicidal ideation.  Nursing note and vitals reviewed.    ED Treatments / Results  Labs (all labs ordered are listed, but only abnormal results are displayed) Labs Reviewed  COMPREHENSIVE METABOLIC PANEL - Abnormal; Notable for the following components:      Result Value   Sodium 131 (*)    Potassium 5.9 (*)    Chloride 92 (*)    Glucose, Bld 500 (*)    Calcium 8.7 (*)    Total Protein 6.1 (*)    Albumin 3.2 (*)    ALT 110 (*)    All other components within normal limits  ACETAMINOPHEN LEVEL - Abnormal; Notable for the following components:   Acetaminophen (Tylenol), Serum <10 (*)    All other components within normal limits  ETHANOL  SALICYLATE LEVEL  CBC  RAPID URINE DRUG SCREEN, HOSP PERFORMED    EKG EKG Interpretation  Date/Time:  Wednesday November 03 2017 04:34:14 EDT Ventricular Rate:  88 PR Interval:    QRS Duration: 99 QT Interval:  351 QTC Calculation: 425 R Axis:   93 Text Interpretation:  Sinus rhythm Borderline right axis deviation Confirmed by Geoffery Lyons ([id number]) on 11/03/2017 4:41:29 AM   Radiology No results found.  Procedures Procedures (including critical care time)  Medications Ordered in ED Medications - No data to display   Initial Impression / Assessment and Plan / ED Course  I have reviewed the triage vital signs and the nursing notes.  Pertinent labs & imaging results that were available during my care of the patient were reviewed by me and considered in my medical decision making (see chart for details).  Patient appears medically clear.  He will remain in the emergency department overnight and will be reassessed by psychiatry in the morning.  Final Clinical Impressions(s) / ED Diagnoses   Final diagnoses:  None    ED Discharge Orders    None       Geoffery Lyons,  MD 11/03/17 [phone]

## 2017-11-03 NOTE — ED Notes (Signed)
Called service response re: meal tray. Kitchen advised this RN that meal tray "will be up shortly."
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
## 2017-11-03 NOTE — ED Notes (Signed)
ED Provider at bedside. 

## 2017-11-03 NOTE — ED Notes (Addendum)
Pt aware his mother advised SW someone would be coming to pick him up w/in the hour. Pt on phone w/his mother at desk. Pt advised his mother advised him his neighbor is coming to pick him up.

## 2017-11-03 NOTE — ED Notes (Signed)
Pharm Tech attempted to obtain Med Rec - pt not cooperative.

## 2017-11-03 NOTE — ED Notes (Addendum)
Pt presents as tense, tight-jawed with delayed and limited responses to questions. Hands appear fidgety, possibly tremulous.

## 2017-11-03 NOTE — ED Notes (Signed)
Patient parents arrived to pick up patient and asking for paperwork which states he is unable to make court date tomorrow due to being sick; patient and parents advised that patient is well enough to make court date in the morning and provided discharge paperwork-Monique,RN

## 2017-11-03 NOTE — Progress Notes (Signed)
CSW received call from [facility] APS Caseworker [phone]) looking for Mr. Crampton.  An APS report was made to them today and worker was trying to determine if he should come to [facility] Psych ED to interview patient.  CSW related that patient had been recommended for discharge and that it was my understanding that family was going to be contacted to pick him up.  APS worker had already spoken to pt's [facility], Nelma Rothman (see this writer's previous note), who related that patient may be malingering.  APS worker noted that pt has a court hearing for assault charges scheduled for tomorrow and there was information that pt may be intentionally neglecting his diabetes care in order to be admitted to the hospital so as to avoid going to court.  CSW called and left voice mail for Ms. Reiman, notifying her of decision to recommend discharge. CSW contacted [facility] Psych ED Nurse, Olin Pia, RN to notify of same.  Timmothy Euler. Kaylyn Lim, MSW, LCSWA Disposition Clinical Social Work [phone] (cell) [phone] (office)

## 2017-11-03 NOTE — Consult Note (Signed)
  Tele Assessment   Tony Cunningham, 42 y.o., male patient presented to [facility] with complaints of schizophrenia and aggressive behavior.  Patient seen via telepsych by this provider; chart reviewed and consulted with Dr. Lucianne Muss on 11/03/17.  Patient has multiple hospital ED visits; Last inpatient hospitalization 5/20 - 10/15/2017.  Patient has been offered multiple outpatient resources for rehabilitation, detox, therapy, and medication management. Patient frequently fails to follow up with these resources.  On evaluation Tony Cunningham reports that he meets help with anger.  Patient states that he did follow-up with [facility] for his intake assessment and his first appointment was today at 9 AM.  Patient was unable to tell any other resources to follow-up with.  Patient states that he needs to be put into a mental hospital but would not elaborate on why he needed to quit in the hospital.  At this time patient denied any auditory/visual hallucinations.  Patient also denies patient denies suicidal/self-harm/homicidal ideation, and paranoia.  Patient asked about the [employer] [facility] " I been one time it was fun."  Patient was also informed that he needed to take care of his diabetes because they could also make him feel odd if his blood sugars are not controlled.  Patient's current fingerstick glucose is 531. During evaluation Tony Cunningham is alert/oriented x 4; calm/cooperative; and mood congruent with affect.  He does not appear to be responding to internal/external stimuli or delusional thoughts.  Patient denied suicidal/self-harm/homicidal ideation, psychosis, and paranoia.  Patient answered question appropriately.  Patient's UDS and alcohol levels are negative.  Patient appears to have more problems with diabetes management.  Patient may need referral/resources they can help him with diabetic management at home.  Patient psychiatrically cleared  Please see note by Scarlette Ar (social worker) related to  conversation with [facility].  Recommendations: Follow-up with [facility] and [employer].   Disposition: No evidence of imminent risk to self or others at present.   Patient does not meet criteria for psychiatric inpatient admission. Spoke with Dr Effie Shy related to above recommendation and disposition.   Tony Found, NP

## 2017-11-03 NOTE — Progress Notes (Signed)
Pt reassessed by [facility] Physician Extender, Assunta Found, NP.  Pt des not meet criteria for inpatient treatment and is recommended for discharge.  [facility] Psych ED notified.  Timmothy Euler. Kaylyn Lim, MSW, LCSWA Disposition Clinical Social Work [phone] (cell) [phone] (office)

## 2017-11-03 NOTE — ED Notes (Signed)
Pt left message for his mother to return call d/t being d/c'd to home. Mother returned call and advised she is concerned about pt returning home. Voiced understanding of pt being d/c'd. States she does not have a vehicle and will call her neighbor to ask if will bring pt and will call back.

## 2017-11-03 NOTE — ED Notes (Signed)
Pt given Sprite Zero and sugar free cookies as requested
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
## 2017-11-03 NOTE — ED Notes (Signed)
This RN called Service Response about pt's meal tray that did not come up with the other Pod F trays. [facility] advised this RN that tray order has target arrival time of 8:26.

## 2017-11-17 ENCOUNTER — Emergency Department (HOSPITAL_COMMUNITY)
Admission: EM | Admit: 2017-11-17 | Discharge: 2017-11-18 | Disposition: A | Discharge status: Home / Self Care | Payer: Medicare Other | Attending: Emergency Medicine | Admitting: Emergency Medicine

## 2017-11-17 ENCOUNTER — Encounter (HOSPITAL_COMMUNITY): Payer: Self-pay

## 2017-11-17 DIAGNOSIS — F6810 Factitious disorder, unspecified: Secondary | ICD-10-CM | POA: Insufficient documentation

## 2017-11-17 DIAGNOSIS — J45909 Unspecified asthma, uncomplicated: Secondary | ICD-10-CM | POA: Diagnosis not present

## 2017-11-17 DIAGNOSIS — R4689 Other symptoms and signs involving appearance and behavior: Secondary | ICD-10-CM | POA: Diagnosis present

## 2017-11-17 DIAGNOSIS — Z794 Long term (current) use of insulin: Secondary | ICD-10-CM | POA: Insufficient documentation

## 2017-11-17 DIAGNOSIS — Z79899 Other long term (current) drug therapy: Secondary | ICD-10-CM | POA: Insufficient documentation

## 2017-11-17 DIAGNOSIS — E119 Type 2 diabetes mellitus without complications: Secondary | ICD-10-CM | POA: Diagnosis not present

## 2017-11-17 DIAGNOSIS — F17210 Nicotine dependence, cigarettes, uncomplicated: Secondary | ICD-10-CM | POA: Insufficient documentation

## 2017-11-17 DIAGNOSIS — F209 Schizophrenia, unspecified: Secondary | ICD-10-CM | POA: Diagnosis not present

## 2017-11-17 HISTORY — DX: Schizophrenia, unspecified: F20.9

## 2017-11-17 LAB — URINALYSIS, ROUTINE W REFLEX MICROSCOPIC
BACTERIA UA: NONE SEEN
Bilirubin Urine: NEGATIVE
HGB URINE DIPSTICK: NEGATIVE
Ketones, ur: 20 mg/dL — AB
NITRITE: NEGATIVE
PROTEIN: NEGATIVE mg/dL
SPECIFIC GRAVITY, URINE: 1.023 (ref 1.005–1.030)
pH: 6 (ref 5.0–8.0)

## 2017-11-17 LAB — RAPID URINE DRUG SCREEN, HOSP PERFORMED
AMPHETAMINES: NOT DETECTED
Benzodiazepines: NOT DETECTED
Cocaine: NOT DETECTED
OPIATES: NOT DETECTED
Tetrahydrocannabinol: NOT DETECTED

## 2017-11-17 LAB — BASIC METABOLIC PANEL
ANION GAP: 12 (ref 5–15)
BUN: 13 mg/dL (ref 6–20)
CHLORIDE: 95 mmol/L — AB (ref 98–111)
CO2: 27 mmol/L (ref 22–32)
CREATININE: 0.73 mg/dL (ref 0.61–1.24)
Calcium: 9 mg/dL (ref 8.9–10.3)
GFR calc non Af Amer: >60 mL/min (ref >60)
Glucose, Bld: 353 mg/dL — ABNORMAL HIGH (ref 70–99)
POTASSIUM: 4.7 mmol/L (ref 3.5–5.1)
SODIUM: 134 mmol/L — AB (ref 135–145)

## 2017-11-17 LAB — HEPATIC FUNCTION PANEL
ALK PHOS: 94 U/L (ref 38–126)
ALT: 21 U/L (ref 0–44)
AST: 19 U/L (ref 15–41)
Albumin: 3.2 g/dL — ABNORMAL LOW (ref 3.5–5.0)
BILIRUBIN DIRECT: 0.2 mg/dL (ref 0.0–0.2)
Indirect Bilirubin: 0.5 mg/dL (ref 0.3–0.9)
Total Bilirubin: 0.7 mg/dL (ref 0.3–1.2)
Total Protein: 5.9 g/dL — ABNORMAL LOW (ref 6.5–8.1)

## 2017-11-17 LAB — CBC
HCT: 43.1 % (ref 39.0–52.0)
Hemoglobin: 14.2 g/dL (ref 13.0–17.0)
MCH: 29 pg (ref 26.0–34.0)
MCHC: 32.9 g/dL (ref 30.0–36.0)
MCV: 88.1 fL (ref 78.0–100.0)
PLATELETS: 300 10*3/uL (ref 150–400)
RBC: 4.89 MIL/uL (ref 4.22–5.81)
RDW: 13.5 % (ref 11.5–15.5)
WBC: 8.7 10*3/uL (ref 4.0–10.5)

## 2017-11-17 LAB — I-STAT CG4 LACTIC ACID, ED: LACTIC ACID, VENOUS: 2.56 mmol/L — AB (ref 0.5–1.9)

## 2017-11-17 LAB — VALPROIC ACID LEVEL: VALPROIC ACID LVL: 40 ug/mL — AB (ref 50.0–100.0)

## 2017-11-17 LAB — CK: CK TOTAL: 190 U/L (ref 49–397)

## 2017-11-17 LAB — CBG MONITORING, ED: Glucose-Capillary: 283 mg/dL — ABNORMAL HIGH (ref 70–99)

## 2017-11-17 LAB — ETHANOL: Alcohol, Ethyl (B): <10 mg/dL (ref <10)

## 2017-11-17 MED ORDER — PROPRANOLOL HCL 40 MG PO TABS
20.0000 mg | ORAL_TABLET | Freq: Two times a day (BID) | ORAL | Status: DC
  Administered 2017-11-17 – 2017-11-18 (×2): 20 mg via ORAL
  Filled 2017-11-17 (×4): qty 1

## 2017-11-17 MED ORDER — VALPROATE SODIUM 500 MG/5ML IV SOLN
250.0000 mg | INTRAVENOUS | Status: AC
  Administered 2017-11-17: 250 mg via INTRAVENOUS
  Filled 2017-11-17: qty 2.5

## 2017-11-17 MED ORDER — SODIUM CHLORIDE 0.9 % IV BOLUS
500.0000 mL | Freq: Once | INTRAVENOUS | Status: AC
  Administered 2017-11-17: 500 mL via INTRAVENOUS

## 2017-11-17 MED ORDER — LORAZEPAM 2 MG/ML IJ SOLN
1.0000 mg | Freq: Four times a day (QID) | INTRAMUSCULAR | Status: DC
  Administered 2017-11-17: 1 mg via INTRAVENOUS
  Filled 2017-11-17: qty 1

## 2017-11-17 MED ORDER — LISINOPRIL 20 MG PO TABS
20.0000 mg | ORAL_TABLET | Freq: Every day | ORAL | Status: DC
  Administered 2017-11-17 – 2017-11-18 (×2): 20 mg via ORAL
  Filled 2017-11-17 (×2): qty 1

## 2017-11-17 MED ORDER — INSULIN GLARGINE 100 UNIT/ML ~~LOC~~ SOLN
40.0000 [IU] | Freq: Every day | SUBCUTANEOUS | Status: DC
  Administered 2017-11-17: 40 [IU] via SUBCUTANEOUS
  Filled 2017-11-17 (×2): qty 0.4

## 2017-11-17 MED ORDER — NICOTINE 21 MG/24HR TD PT24
21.0000 mg | MEDICATED_PATCH | Freq: Once | TRANSDERMAL | Status: AC
  Administered 2017-11-17: 21 mg via TRANSDERMAL
  Filled 2017-11-17: qty 1

## 2017-11-17 MED ORDER — SODIUM CHLORIDE 0.9 % IV BOLUS
1000.0000 mL | Freq: Once | INTRAVENOUS | Status: AC
  Administered 2017-11-17: 1000 mL via INTRAVENOUS

## 2017-11-17 MED ORDER — INSULIN ASPART 100 UNIT/ML ~~LOC~~ SOLN
8.0000 [IU] | Freq: Once | SUBCUTANEOUS | Status: AC
  Administered 2017-11-17: 8 [IU] via SUBCUTANEOUS
  Filled 2017-11-17: qty 1

## 2017-11-17 MED ORDER — PANTOPRAZOLE SODIUM 40 MG PO TBEC
40.0000 mg | DELAYED_RELEASE_TABLET | Freq: Every day | ORAL | Status: DC
  Administered 2017-11-17 – 2017-11-18 (×2): 40 mg via ORAL
  Filled 2017-11-17 (×2): qty 1

## 2017-11-17 MED ORDER — SODIUM CHLORIDE 0.9 % IV SOLN
Freq: Once | INTRAVENOUS | Status: AC
  Administered 2017-11-17: 16:00:00 via INTRAVENOUS

## 2017-11-17 MED ORDER — INSULIN ASPART 100 UNIT/ML ~~LOC~~ SOLN
5.0000 [IU] | Freq: Three times a day (TID) | SUBCUTANEOUS | Status: DC
  Administered 2017-11-18 (×2): 5 [IU] via SUBCUTANEOUS
  Filled 2017-11-17 (×2): qty 1

## 2017-11-17 MED ORDER — LORAZEPAM 2 MG/ML IJ SOLN
1.0000 mg | INTRAMUSCULAR | Status: AC | PRN
  Administered 2017-11-17 (×2): 1 mg via INTRAVENOUS
  Filled 2017-11-17 (×2): qty 1

## 2017-11-17 MED ORDER — DIVALPROEX SODIUM 250 MG PO DR TAB
500.0000 mg | DELAYED_RELEASE_TABLET | Freq: Two times a day (BID) | ORAL | Status: DC
Start: 2017-11-17 — End: 2017-11-19
  Administered 2017-11-17 – 2017-11-18 (×2): 500 mg via ORAL
  Filled 2017-11-17 (×2): qty 2

## 2017-11-17 MED ORDER — MONTELUKAST SODIUM 10 MG PO TABS: 10.0000 mg | ORAL_TABLET | Freq: Every day | ORAL | Status: DC

## 2017-11-17 NOTE — ED Notes (Signed)
Sitter at the bedside.

## 2017-11-17 NOTE — ED Triage Notes (Signed)
Family reports that they are here to have daily medications filled and concerned of behavioral issues, states that they were sent from [facility] for further evaluation of reported suicidal thoughts and aggressive behavior. Patient denies both. Appears restless. Family reports that they decided not to civil IVC from magistrate earlier today
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
## 2017-11-17 NOTE — ED Notes (Signed)
Pt. Found to be diaphoretic and shaky. Fayrene Fearing, MD made aware.

## 2017-11-17 NOTE — Progress Notes (Signed)
After discussion with Dr Fayrene Fearing and reviewing the patient, he will be kept over night to return him to his medical baseline.  Then, psych should re-evaluate him for discharge, as per notes, this appears to be a regular pattern for him.  His mother is his caregiver and they were arguing prior to admission.  When altercations occur or court dates, the coping mechanism appears to come to the ED which gives everyone a chance to de-escalate.  He should be stable psychiatrically to return home tomorrow.  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  Nanine Means, PMHNP

## 2017-11-17 NOTE — ED Notes (Signed)
Requested Depacon from pharmacy.
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
## 2017-11-17 NOTE — ED Notes (Signed)
Pt and bed were wet with urine. Bed linen changed, new brief and new pants put on pt.

## 2017-11-17 NOTE — ED Notes (Signed)
ED Provider at bedside. 

## 2017-11-17 NOTE — ED Notes (Signed)
RN helped stand pt. Pt done well standing, we also walked around in the room. Pt said it helped. Pt now laying down in the bed resting

## 2017-11-17 NOTE — ED Notes (Signed)
Sitter at bedside.

## 2017-11-17 NOTE — ED Notes (Signed)
Pt combative, screaming,refusing labs, IV, and medications.

## 2017-11-17 NOTE — ED Provider Notes (Signed)
Patient placed in Quick Look pathway, seen and evaluated   Chief Complaint pscyh problems  HPI:   : patient bib family for for psych issues. Not sleeping, not taking meds. Denies si/hi; family says he is  ROS: paranoid (one)  Physical Exam:   Gen: No distress  Neuro: Awake and Alert  Skin: Warm    Focused Exam:  Lungs   Initiation of care has begun. The patient has been counseled on the process, plan, and necessity for staying for the completion/evaluation, and the remainder of the medical screening examination    Arthor Captain, PA-C 11/17/17 1326    Margarita Grizzle, MD 11/17/17 1348

## 2017-11-17 NOTE — ED Notes (Addendum)
Pt had additional family members enter the room (4), pt became aggressive and combative due to family members arguing. This RN asked if only 2 family members could be present, family became loud and stormed out.

## 2017-11-18 ENCOUNTER — Encounter (HOSPITAL_COMMUNITY): Payer: Self-pay | Admitting: Registered Nurse

## 2017-11-18 LAB — CBG MONITORING, ED
GLUCOSE-CAPILLARY: 273 mg/dL — AB (ref 70–99)
Glucose-Capillary: 225 mg/dL — ABNORMAL HIGH (ref 70–99)
Glucose-Capillary: 368 mg/dL — ABNORMAL HIGH (ref 70–99)

## 2017-11-18 NOTE — ED Notes (Signed)
Fayrene Fearing, MD made aware of BP. Was told to let the pt. Rest. Will continue to monitor

## 2017-11-18 NOTE — Consult Note (Signed)
  Tele Assessment   Tony Cunningham, 42 y.o., male patient seen via telepsych by TTS and  this provider; chart reviewed and consulted with Dr. Lucianne Muss on 11/18/17.  On evaluation Tony Cunningham denies suicidal/self-harm/homicidal ideation, psychosis, and paranoia.  ETOH, and UDS negative.  Valproic acid lever 40.  During evaluation Tony Cunningham is alert/oriented x 4; calm/cooperative; and mood congruent with affect.  Tremors noted in up[er ext bilateral.  He does not appear to be responding to internal/external stimuli or delusional thoughts.  Patient denies suicidal/self-harm/homicidal ideation, psychosis, and paranoia.  Patient answered question appropriately.  Psychiatrically cleared    Recommendations:  Outpatient psychiatric services  Disposition: No evidence of imminent risk to self or others at present.   Patient does not meet criteria for psychiatric inpatient admission.   Spoke with Dr. Effie Shy; informed of above recommendation and disposition  Donnis Pecha B. Dinari Stgermaine, NP

## 2017-11-18 NOTE — ED Notes (Signed)
Pt wanting to walk around unit. Pt redirectable to walking around room. Offered coloring books and magazines. Pt deciding to lay in bed and watch tv. Will continue to monitor.

## 2017-11-18 NOTE — ED Notes (Signed)
Pt's CBG result was 273. [facility] - RN.

## 2017-11-18 NOTE — ED Notes (Addendum)
Pt speaking to mother on phone about coming to pick him up. Mother told this RN that she would be coming with clothing to pick up patient around 8pm. Pt has no belongings at this time.

## 2017-11-18 NOTE — ED Notes (Addendum)
Pt eating dinner tray independently. Tremors slightly decreased. Pt not spilling on floor but spilled sauce on gown.

## 2017-11-18 NOTE — ED Notes (Signed)
Pt is unable to feed self due to shaking-- pt attempted to feed self, dropping a large amount of food on floor, sitter fed pt.  Pt ambulatory with sitter - steady on feet with assistance

## 2017-11-18 NOTE — ED Notes (Signed)
[facility] notified - will be re-evaluated sometime today- pt denies SI

## 2017-11-18 NOTE — BH Assessment (Signed)
Tele Assessment Note   Patient Name: Tony Cunningham MRN: [id number] Referring Physician: TTS Location of Patient: [facility] Location of Provider: Behavioral Health TTS Department  Tony Cunningham is an 42 y.o. male. Pt denies SI/HI and AVH. Pt reports concerns about his diabetes. Pt denies current psychiatric needs. Pt has a history of past hospitalizations. Pt is not receiving current mental health treatment. Pt's mother is his guardian.  Diagnosis: From past diagnosis F20.9 Schizophrenia  Past Medical History:  Past Medical History:  Diagnosis Date  . Anxiety   . Asthma   . Diabetes mellitus   . High blood pressure   . Schizophrenia (HCC)   . Sinus complaint     History reviewed. No pertinent surgical history.  Family History: History reviewed. No pertinent family history.  Social History:  reports that he has been smoking cigarettes.  He has been smoking about 0.50 packs per day. He has never used smokeless tobacco. He reports that he does not drink alcohol or use drugs.  Additional Social History:  Alcohol / Drug Use Pain Medications: please see mar Prescriptions: please see mar Over the Counter: please see mar History of alcohol / drug use?: No history of alcohol / drug abuse Longest period of sobriety (when/how long): NA  CIWA: CIWA-Ar BP: 120/70 Pulse Rate: 70 COWS:    Allergies:  Allergies  Allergen Reactions  . 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 AVOID typical antipsychotics  . Seroquel [Quetiapine Fumarate] Other (See Comments)    dizziness  . Omeprazole     Patient on clozapine. Do NOT use omeprazole, which is the ONLY PPI that will reduce his clozapine level, thanks  . Valproic Acid And Related Other (See Comments)    Thrombocytopenia (see xp 09/08/17 note)    Home Medications:  (Not in a hospital admission)  OB/GYN Status:  No LMP for male patient.  General Assessment Data Location of Assessment:  [facility] ED TTS Assessment: In system Is this a Tele or Face-to-Face Assessment?: Tele Assessment Is this an Initial Assessment or a Re-assessment for this encounter?: Initial Assessment Marital status: Widowed [place] name: NA Is patient pregnant?: No Pregnancy Status: No Living Arrangements: Parent Can pt return to current living arrangement?: Yes Admission Status: Voluntary Is patient capable of signing voluntary admission?: Yes Referral Source: Self/Family/Friend Insurance type: Medicare     Crisis Care Plan Living Arrangements: Parent Legal Guardian: Mother Name of Psychiatrist: NA Name of Therapist: NA  Education Status Is patient currently in school?: No Is the patient employed, unemployed or receiving disability?: Unemployed  Risk to self with the past 6 months Suicidal Ideation: No Has patient been a risk to self within the past 6 months prior to admission? : Yes Suicidal Intent: No Has patient had any suicidal intent within the past 6 months prior to admission? : No Is patient at risk for suicide?: No Suicidal Plan?: No Has patient had any suicidal plan within the past 6 months prior to admission? : No Access to Means: No What has been your use of drugs/alcohol within the last 12 months?: NA Previous Attempts/Gestures: No How many times?: 0 Other Self Harm Risks: NA Triggers for Past Attempts: None known Intentional Self Injurious Behavior: None Comment - Self Injurious Behavior: NA Family Suicide History: No Recent stressful life event(s): Other (Comment)(physical health) Persecutory voices/beliefs?: No Depression: No Depression Symptoms: (pt denies) Substance abuse history and/or treatment for substance abuse?: No Suicide prevention information given to non-admitted patients: Not  applicable  Risk to Others within the past 6 months Homicidal Ideation: No Does patient have any lifetime risk of violence toward others beyond the six months prior to admission? :  No Thoughts of Harm to Others: No Comment - Thoughts of Harm to Others: NA Current Homicidal Intent: No Current Homicidal Plan: No Access to Homicidal Means: No Identified Victim: NA History of harm to others?: No Assessment of Violence: None Noted Violent Behavior Description: NA Does patient have access to weapons?: No Criminal Charges Pending?: No Does patient have a court date: No Is patient on probation?: No  Psychosis Hallucinations: None noted Delusions: None noted  Mental Status Report Appearance/Hygiene: Unremarkable Eye Contact: Fair Motor Activity: Tremors Speech: Logical/coherent Level of Consciousness: Alert Mood: Euthymic Affect: Flat Anxiety Level: Minimal Thought Processes: Coherent, Relevant Judgement: Unimpaired Orientation: Person, Place, Time, Situation Obsessive Compulsive Thoughts/Behaviors: None  Cognitive Functioning Concentration: Normal Memory: Recent Intact, Remote Intact Is patient IDD: No Is patient DD?: No Insight: Fair Impulse Control: Fair Appetite: Fair Have you had any weight changes? : No Change Amount of the weight change? (lbs): 6 lbs Sleep: No Change Total Hours of Sleep: 6 Vegetative Symptoms: None  ADLScreening [facility] Assessment Services) Patient's cognitive ability adequate to safely complete daily activities?: Yes Patient able to express need for assistance with ADLs?: Yes Independently performs ADLs?: Yes (appropriate for developmental age)  Prior Inpatient Therapy Prior Inpatient Therapy: Yes Prior Therapy Dates: May 2019 Prior Therapy Facilty/Provider(s): [facility] Reason for Treatment: Schizophrenia  Prior Outpatient Therapy Prior Outpatient Therapy: Yes Prior Therapy Dates: unknown Prior Therapy Facilty/Provider(s): unknown Reason for Treatment: unknown Does patient have an ACCT team?: No Does patient have Intensive In-House Services?  : No Does patient have [facility] services? : No Does patient have P4CC  services?: No  ADL Screening (condition at time of admission) Patient's cognitive ability adequate to safely complete daily activities?: Yes Is the patient deaf or have difficulty hearing?: No Does the patient have difficulty seeing, even when wearing glasses/contacts?: No Does the patient have difficulty concentrating, remembering, or making decisions?: No Patient able to express need for assistance with ADLs?: Yes Does the patient have difficulty dressing or bathing?: No Independently performs ADLs?: Yes (appropriate for developmental age)       Abuse/Neglect Assessment (Assessment to be complete while patient is alone) Abuse/Neglect Assessment Can Be Completed: Yes Physical Abuse: Denies Verbal Abuse: Denies Sexual Abuse: Denies Exploitation of patient/patient's resources: Denies     Merchant navy officer (For Healthcare) Does Patient Have a Medical Advance Directive?: No Would patient like information on creating a medical advance directive?: No - Patient declined    Additional Information 1:1 In Past 12 Months?: No CIRT Risk: No Elopement Risk: No Does patient have medical clearance?: Yes     Disposition:  Disposition Initial Assessment Completed for this Encounter: Yes Disposition of Patient: Discharge Patient refused recommended treatment: No  This service was provided via telemedicine using a 2-way, interactive audio and video technology.  Names of all persons participating in this telemedicine service and their role in this encounter. Name: Assunta Found Role: NP  Name:  Role:   Name:  Role:   Name:  Role:     Baptiste Littler D 11/18/2017 1:32 PM

## 2017-11-18 NOTE — ED Notes (Signed)
MD Wentz at bedside.  

## 2017-11-18 NOTE — ED Notes (Signed)
Report received from [facility], RN- pt awake, alert- moved into [facility]

## 2017-11-18 NOTE — ED Notes (Signed)
Ordered heart healthy meal tray for pt. 

## 2017-11-18 NOTE — ED Provider Notes (Signed)
He has been seen by TTS and recommended for d/c. He is calm and cooperative. He states he is ready to go home to his mother's house where he lives.   Mancel Bale, MD 11/18/17 1524

## 2017-11-18 NOTE — Progress Notes (Signed)
Disposition CSW called pt's [facility], Nelma Rothman, to advise that he was in the ED again.  Timmothy Euler. Kaylyn Lim, MSW, LCSWA Disposition Clinical Social Work [phone] (cell) [phone] (office)

## 2017-11-18 NOTE — Discharge Instructions (Addendum)
See your psychiatrist as usual.  Take all of your medicine as usual.

## 2017-11-18 NOTE — ED Notes (Signed)
Spoke with EDP on pod E regarding pt's shaking and tremors. Pt still has saline lock in left AC. Pt remains alert/oriented x 4.

## 2017-11-18 NOTE — ED Notes (Signed)
RN and NT changed pt's soiled brief. NT fed pt due to pt stating he could not feed himself. Pt continues to not answer RN other than short yes or no with much prompting.

## 2017-11-18 NOTE — ED Notes (Signed)
Patient's mother arrived to pick patient up-Monique,RN

## 2018-02-16 IMAGING — CR DG CERVICAL SPINE COMPLETE 4+V
1 series · 7 of 7 positions shown · non-contrast
Comparison: None.

CLINICAL DATA: Posterior bilateral neck pain that runs down to both
shoulders. Symptoms for 2 weeks. Fell 2 weeks ago.

EXAM:
CERVICAL SPINE - COMPLETE 4+ VIEW

[Series 1: dg cervical spine complete · 0.14mm/px · 7 of 7 slices shown]
[im 1/7]
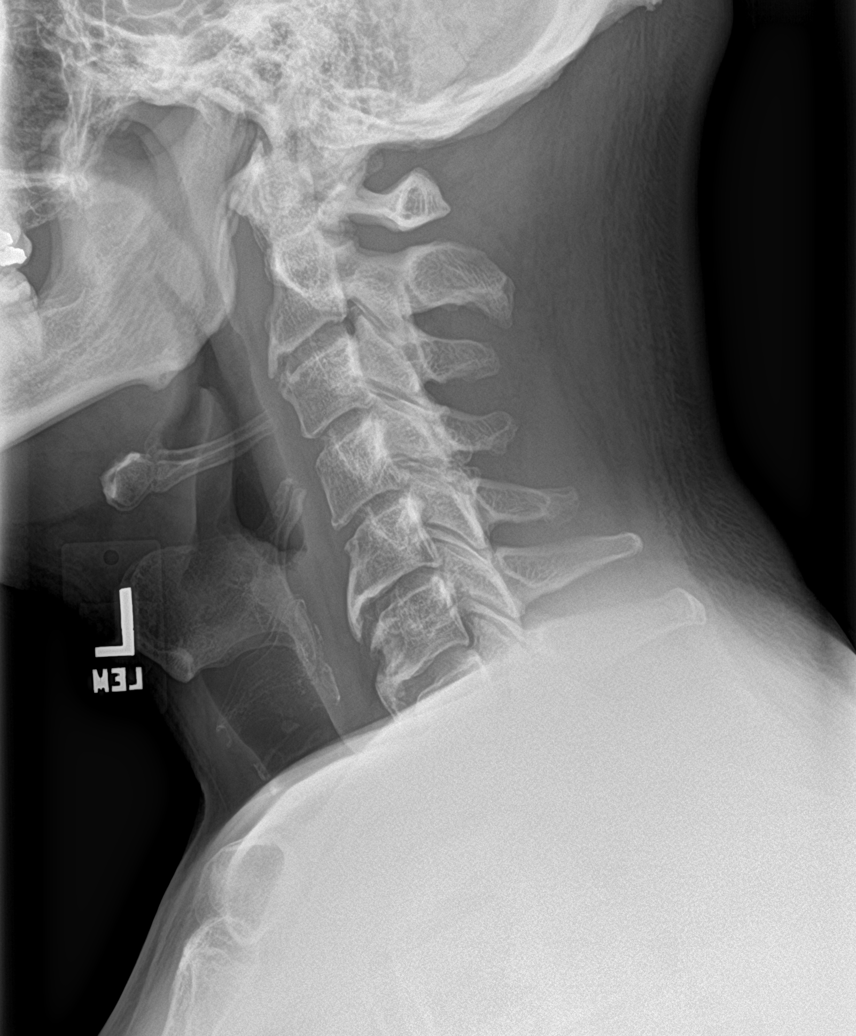
[im 2/7]
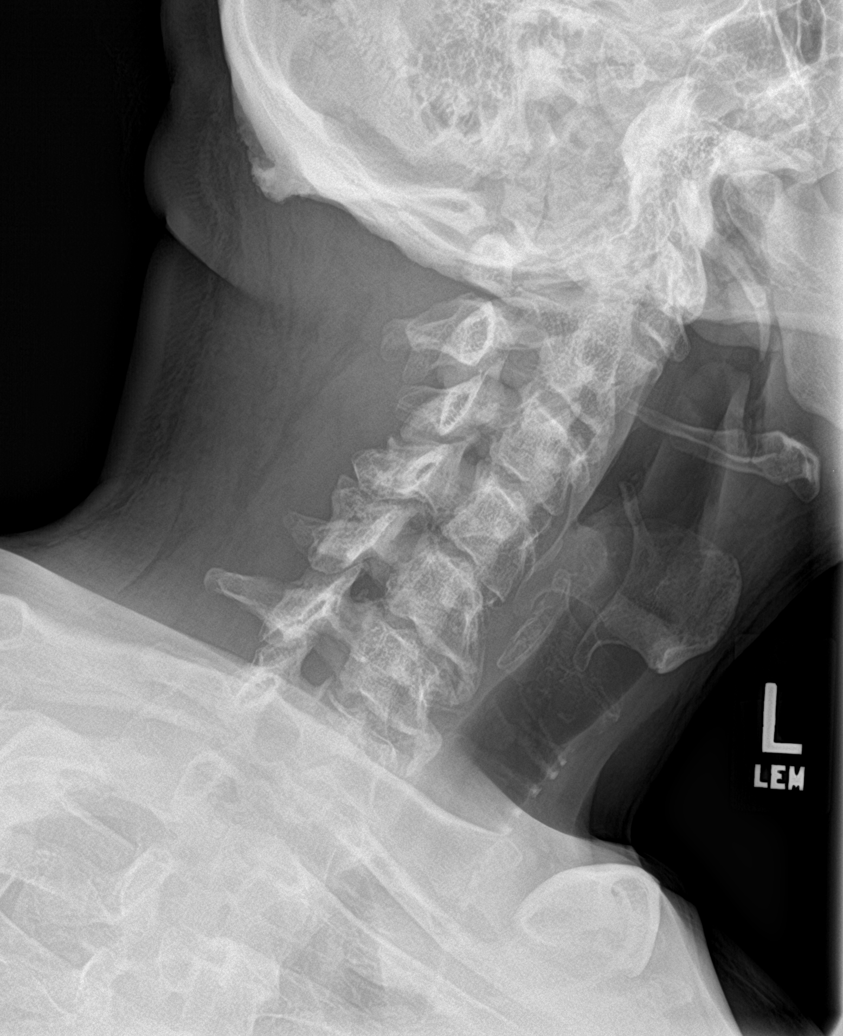
[im 3/7]
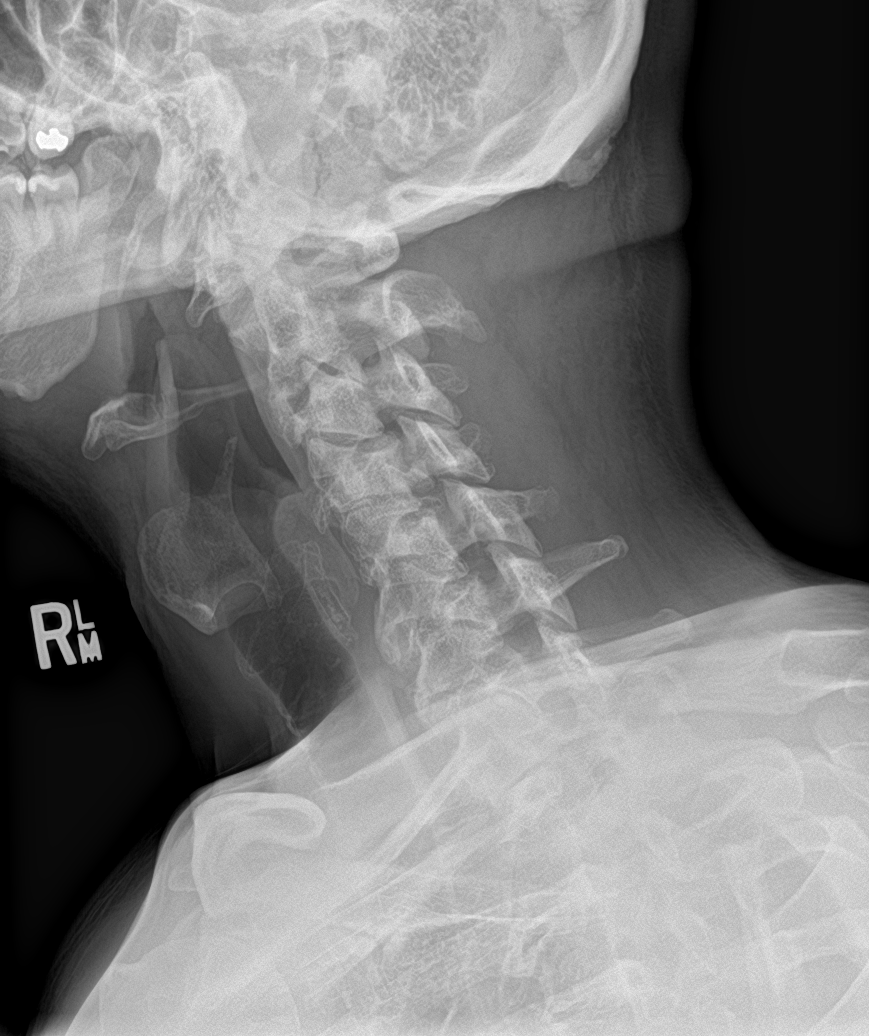
[im 4/7]
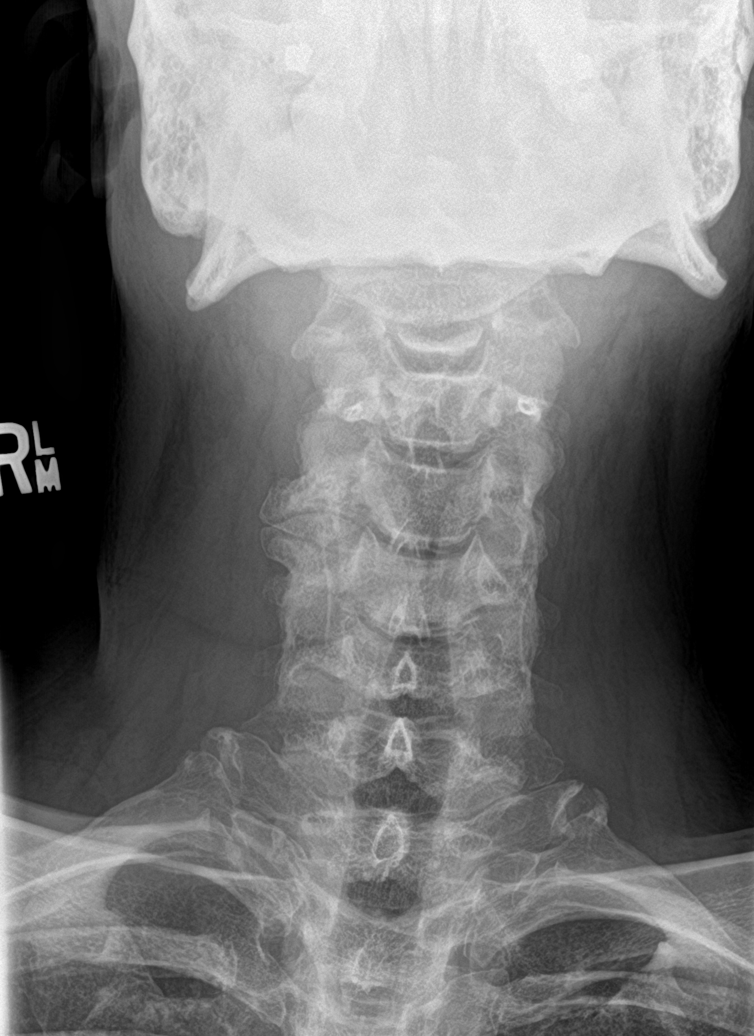
[im 5/7]
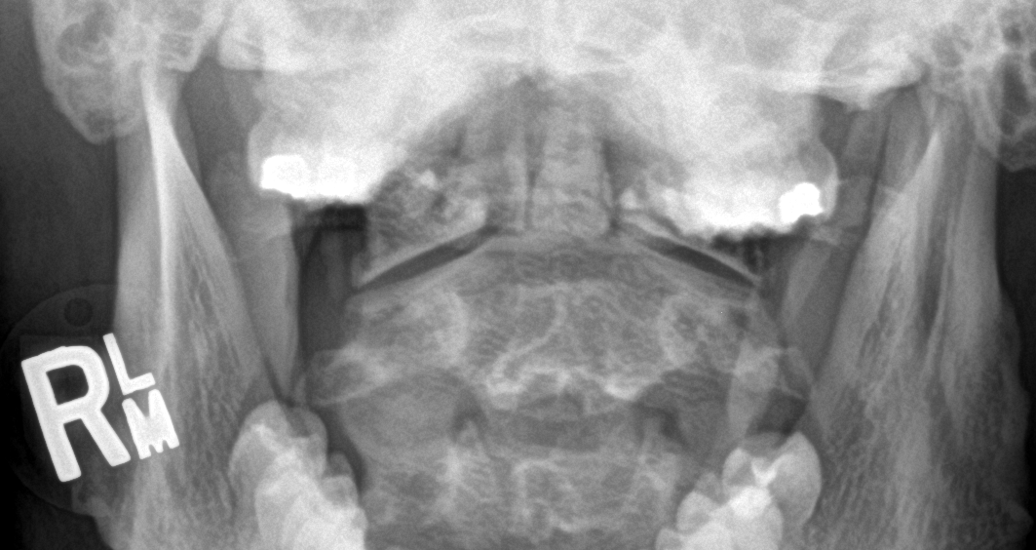
[im 6/7]
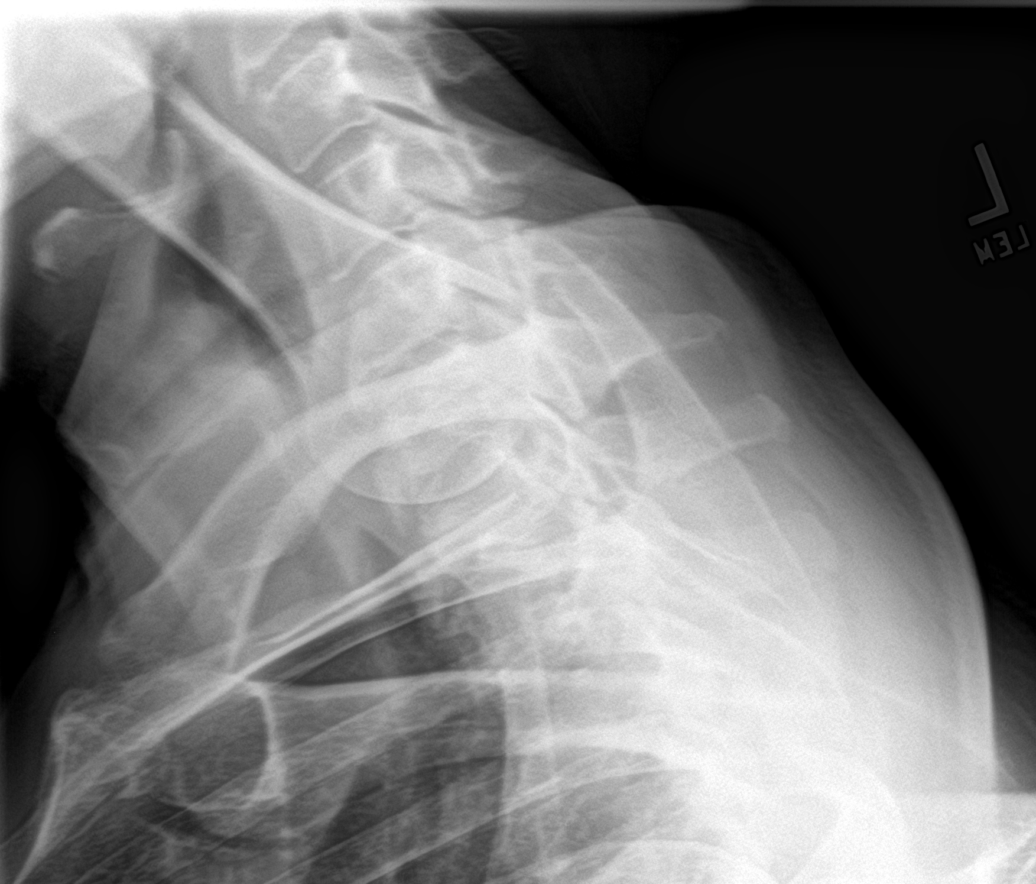
[im 7/7]
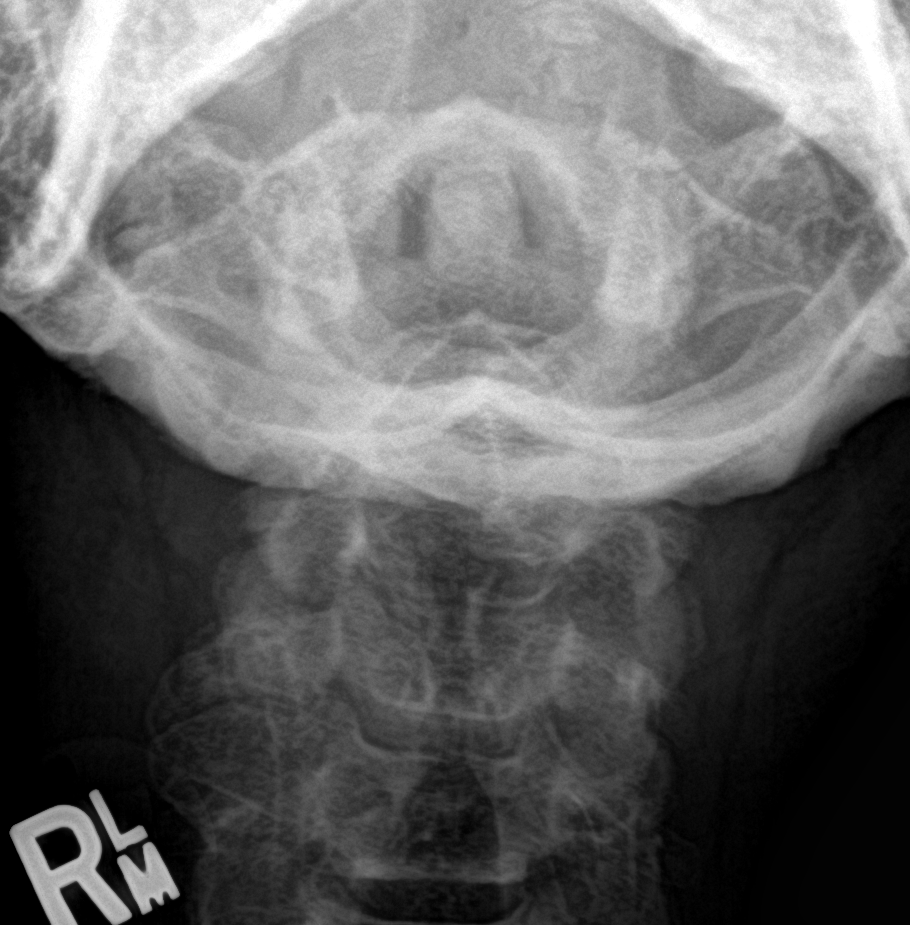

[7 of 7 positions shown; findings below may reference images not displayed]

FINDINGS: Mild straightening of the cervical spine. Alignment at the
cervicothoracic junction appears to be grossly normal but the bone
detail is limited at this level. Anterior osteophytes at C5 and C6.
Prevertebral soft tissues are within normal limits. No significant
disc space narrowing. Bilateral facet disease most prominent along
the right side of C4-C5. Mild bony encroachment of the right foramen
at C3-C4 and C4-C5.
IMPRESSION: Degenerative changes in cervical spine.  No acute bone abnormality.

## 2018-07-05 DIAGNOSIS — I10 Essential (primary) hypertension: Secondary | ICD-10-CM

## 2018-07-05 DIAGNOSIS — F209 Schizophrenia, unspecified: Secondary | ICD-10-CM

## 2018-07-05 DIAGNOSIS — F23 Brief psychotic disorder: Secondary | ICD-10-CM | POA: Diagnosis not present

## 2018-07-05 DIAGNOSIS — E119 Type 2 diabetes mellitus without complications: Secondary | ICD-10-CM

## 2018-07-05 DIAGNOSIS — R8290 Unspecified abnormal findings in urine: Secondary | ICD-10-CM | POA: Diagnosis not present

## 2018-07-05 DIAGNOSIS — E1110 Type 2 diabetes mellitus with ketoacidosis without coma: Secondary | ICD-10-CM | POA: Diagnosis not present

## 2018-07-05 DIAGNOSIS — E785 Hyperlipidemia, unspecified: Secondary | ICD-10-CM

## 2018-07-05 DIAGNOSIS — J9601 Acute respiratory failure with hypoxia: Secondary | ICD-10-CM

## 2018-07-06 DIAGNOSIS — I10 Essential (primary) hypertension: Secondary | ICD-10-CM

## 2018-07-06 DIAGNOSIS — J9601 Acute respiratory failure with hypoxia: Secondary | ICD-10-CM | POA: Diagnosis not present

## 2018-07-06 DIAGNOSIS — F319 Bipolar disorder, unspecified: Secondary | ICD-10-CM

## 2018-07-06 DIAGNOSIS — E1310 Other specified diabetes mellitus with ketoacidosis without coma: Secondary | ICD-10-CM

## 2018-07-06 DIAGNOSIS — F23 Brief psychotic disorder: Secondary | ICD-10-CM | POA: Diagnosis not present

## 2018-07-06 DIAGNOSIS — N481 Balanitis: Secondary | ICD-10-CM | POA: Diagnosis not present

## 2018-07-07 DIAGNOSIS — F23 Brief psychotic disorder: Secondary | ICD-10-CM | POA: Diagnosis not present

## 2018-07-07 DIAGNOSIS — F319 Bipolar disorder, unspecified: Secondary | ICD-10-CM | POA: Diagnosis not present

## 2018-07-07 DIAGNOSIS — N481 Balanitis: Secondary | ICD-10-CM | POA: Diagnosis not present

## 2018-07-07 DIAGNOSIS — J9601 Acute respiratory failure with hypoxia: Secondary | ICD-10-CM | POA: Diagnosis not present

## 2018-07-08 DIAGNOSIS — F319 Bipolar disorder, unspecified: Secondary | ICD-10-CM | POA: Diagnosis not present

## 2018-07-08 DIAGNOSIS — J9601 Acute respiratory failure with hypoxia: Secondary | ICD-10-CM | POA: Diagnosis not present

## 2018-07-08 DIAGNOSIS — F23 Brief psychotic disorder: Secondary | ICD-10-CM | POA: Diagnosis not present

## 2018-07-08 DIAGNOSIS — N481 Balanitis: Secondary | ICD-10-CM | POA: Diagnosis not present

## 2018-07-09 DIAGNOSIS — F319 Bipolar disorder, unspecified: Secondary | ICD-10-CM | POA: Diagnosis not present

## 2018-07-09 DIAGNOSIS — N481 Balanitis: Secondary | ICD-10-CM | POA: Diagnosis not present

## 2018-07-09 DIAGNOSIS — F23 Brief psychotic disorder: Secondary | ICD-10-CM | POA: Diagnosis not present

## 2018-07-09 DIAGNOSIS — J9601 Acute respiratory failure with hypoxia: Secondary | ICD-10-CM | POA: Diagnosis not present

## 2018-07-10 DIAGNOSIS — N481 Balanitis: Secondary | ICD-10-CM | POA: Diagnosis not present

## 2018-07-10 DIAGNOSIS — J9601 Acute respiratory failure with hypoxia: Secondary | ICD-10-CM | POA: Diagnosis not present

## 2018-07-10 DIAGNOSIS — F23 Brief psychotic disorder: Secondary | ICD-10-CM | POA: Diagnosis not present

## 2018-07-10 DIAGNOSIS — F319 Bipolar disorder, unspecified: Secondary | ICD-10-CM | POA: Diagnosis not present

## 2018-07-11 DIAGNOSIS — F23 Brief psychotic disorder: Secondary | ICD-10-CM | POA: Diagnosis not present

## 2018-07-11 DIAGNOSIS — F319 Bipolar disorder, unspecified: Secondary | ICD-10-CM | POA: Diagnosis not present

## 2018-07-11 DIAGNOSIS — J9601 Acute respiratory failure with hypoxia: Secondary | ICD-10-CM | POA: Diagnosis not present

## 2018-07-11 DIAGNOSIS — N481 Balanitis: Secondary | ICD-10-CM | POA: Diagnosis not present

## 2018-07-12 DIAGNOSIS — N481 Balanitis: Secondary | ICD-10-CM | POA: Diagnosis not present

## 2018-07-12 DIAGNOSIS — F319 Bipolar disorder, unspecified: Secondary | ICD-10-CM | POA: Diagnosis not present

## 2018-07-12 DIAGNOSIS — F23 Brief psychotic disorder: Secondary | ICD-10-CM | POA: Diagnosis not present

## 2018-07-12 DIAGNOSIS — J9601 Acute respiratory failure with hypoxia: Secondary | ICD-10-CM | POA: Diagnosis not present

## 2018-07-13 DIAGNOSIS — E1110 Type 2 diabetes mellitus with ketoacidosis without coma: Secondary | ICD-10-CM | POA: Diagnosis not present

## 2018-07-13 DIAGNOSIS — R8290 Unspecified abnormal findings in urine: Secondary | ICD-10-CM | POA: Diagnosis not present

## 2018-07-13 DIAGNOSIS — F23 Brief psychotic disorder: Secondary | ICD-10-CM | POA: Diagnosis not present

## 2018-07-13 DIAGNOSIS — J9601 Acute respiratory failure with hypoxia: Secondary | ICD-10-CM | POA: Diagnosis not present

## 2018-07-14 DIAGNOSIS — J9601 Acute respiratory failure with hypoxia: Secondary | ICD-10-CM | POA: Diagnosis not present

## 2018-07-14 DIAGNOSIS — R8290 Unspecified abnormal findings in urine: Secondary | ICD-10-CM | POA: Diagnosis not present

## 2018-07-14 DIAGNOSIS — F23 Brief psychotic disorder: Secondary | ICD-10-CM | POA: Diagnosis not present

## 2018-07-14 DIAGNOSIS — E1110 Type 2 diabetes mellitus with ketoacidosis without coma: Secondary | ICD-10-CM | POA: Diagnosis not present

## 2018-07-15 DIAGNOSIS — J9601 Acute respiratory failure with hypoxia: Secondary | ICD-10-CM | POA: Diagnosis not present

## 2018-07-15 DIAGNOSIS — F23 Brief psychotic disorder: Secondary | ICD-10-CM | POA: Diagnosis not present

## 2018-07-15 DIAGNOSIS — E1110 Type 2 diabetes mellitus with ketoacidosis without coma: Secondary | ICD-10-CM | POA: Diagnosis not present

## 2018-07-15 DIAGNOSIS — R8290 Unspecified abnormal findings in urine: Secondary | ICD-10-CM | POA: Diagnosis not present

## 2018-07-16 DIAGNOSIS — E1110 Type 2 diabetes mellitus with ketoacidosis without coma: Secondary | ICD-10-CM | POA: Diagnosis not present

## 2018-07-16 DIAGNOSIS — R8290 Unspecified abnormal findings in urine: Secondary | ICD-10-CM | POA: Diagnosis not present

## 2018-07-16 DIAGNOSIS — J9601 Acute respiratory failure with hypoxia: Secondary | ICD-10-CM | POA: Diagnosis not present

## 2018-07-16 DIAGNOSIS — F23 Brief psychotic disorder: Secondary | ICD-10-CM | POA: Diagnosis not present

## 2018-07-17 DIAGNOSIS — E1110 Type 2 diabetes mellitus with ketoacidosis without coma: Secondary | ICD-10-CM | POA: Diagnosis not present

## 2018-07-17 DIAGNOSIS — R8290 Unspecified abnormal findings in urine: Secondary | ICD-10-CM | POA: Diagnosis not present

## 2018-07-17 DIAGNOSIS — F23 Brief psychotic disorder: Secondary | ICD-10-CM | POA: Diagnosis not present

## 2018-07-17 DIAGNOSIS — J9601 Acute respiratory failure with hypoxia: Secondary | ICD-10-CM | POA: Diagnosis not present

## 2018-07-18 DIAGNOSIS — F23 Brief psychotic disorder: Secondary | ICD-10-CM | POA: Diagnosis not present

## 2018-07-18 DIAGNOSIS — R8290 Unspecified abnormal findings in urine: Secondary | ICD-10-CM | POA: Diagnosis not present

## 2018-07-18 DIAGNOSIS — E1110 Type 2 diabetes mellitus with ketoacidosis without coma: Secondary | ICD-10-CM | POA: Diagnosis not present

## 2018-07-18 DIAGNOSIS — J9601 Acute respiratory failure with hypoxia: Secondary | ICD-10-CM | POA: Diagnosis not present

## 2018-07-19 DIAGNOSIS — E1110 Type 2 diabetes mellitus with ketoacidosis without coma: Secondary | ICD-10-CM | POA: Diagnosis not present

## 2018-07-19 DIAGNOSIS — R8290 Unspecified abnormal findings in urine: Secondary | ICD-10-CM | POA: Diagnosis not present

## 2018-07-19 DIAGNOSIS — F23 Brief psychotic disorder: Secondary | ICD-10-CM | POA: Diagnosis not present

## 2018-07-19 DIAGNOSIS — J9601 Acute respiratory failure with hypoxia: Secondary | ICD-10-CM | POA: Diagnosis not present

## 2018-07-20 DIAGNOSIS — E1110 Type 2 diabetes mellitus with ketoacidosis without coma: Secondary | ICD-10-CM | POA: Diagnosis not present

## 2018-07-20 DIAGNOSIS — F23 Brief psychotic disorder: Secondary | ICD-10-CM | POA: Diagnosis not present

## 2018-07-20 DIAGNOSIS — J9601 Acute respiratory failure with hypoxia: Secondary | ICD-10-CM | POA: Diagnosis not present

## 2018-07-20 DIAGNOSIS — R8290 Unspecified abnormal findings in urine: Secondary | ICD-10-CM | POA: Diagnosis not present

## 2018-07-21 DIAGNOSIS — F23 Brief psychotic disorder: Secondary | ICD-10-CM | POA: Diagnosis not present

## 2018-07-21 DIAGNOSIS — R8290 Unspecified abnormal findings in urine: Secondary | ICD-10-CM | POA: Diagnosis not present

## 2018-07-21 DIAGNOSIS — E1110 Type 2 diabetes mellitus with ketoacidosis without coma: Secondary | ICD-10-CM | POA: Diagnosis not present

## 2018-07-21 DIAGNOSIS — J9601 Acute respiratory failure with hypoxia: Secondary | ICD-10-CM | POA: Diagnosis not present

## 2018-07-22 DIAGNOSIS — R8290 Unspecified abnormal findings in urine: Secondary | ICD-10-CM | POA: Diagnosis not present

## 2018-07-22 DIAGNOSIS — J9601 Acute respiratory failure with hypoxia: Secondary | ICD-10-CM | POA: Diagnosis not present

## 2018-07-22 DIAGNOSIS — E1110 Type 2 diabetes mellitus with ketoacidosis without coma: Secondary | ICD-10-CM | POA: Diagnosis not present

## 2018-07-22 DIAGNOSIS — F23 Brief psychotic disorder: Secondary | ICD-10-CM | POA: Diagnosis not present

## 2018-07-23 DIAGNOSIS — E1110 Type 2 diabetes mellitus with ketoacidosis without coma: Secondary | ICD-10-CM | POA: Diagnosis not present

## 2018-07-23 DIAGNOSIS — F23 Brief psychotic disorder: Secondary | ICD-10-CM | POA: Diagnosis not present

## 2018-07-23 DIAGNOSIS — R8290 Unspecified abnormal findings in urine: Secondary | ICD-10-CM | POA: Diagnosis not present

## 2018-07-23 DIAGNOSIS — J9601 Acute respiratory failure with hypoxia: Secondary | ICD-10-CM | POA: Diagnosis not present

## 2018-07-24 DIAGNOSIS — E1110 Type 2 diabetes mellitus with ketoacidosis without coma: Secondary | ICD-10-CM | POA: Diagnosis not present

## 2018-07-24 DIAGNOSIS — F23 Brief psychotic disorder: Secondary | ICD-10-CM | POA: Diagnosis not present

## 2018-07-24 DIAGNOSIS — R8290 Unspecified abnormal findings in urine: Secondary | ICD-10-CM | POA: Diagnosis not present

## 2018-07-24 DIAGNOSIS — J9601 Acute respiratory failure with hypoxia: Secondary | ICD-10-CM | POA: Diagnosis not present

## 2018-07-25 DIAGNOSIS — J9601 Acute respiratory failure with hypoxia: Secondary | ICD-10-CM | POA: Diagnosis not present

## 2018-07-25 DIAGNOSIS — E1110 Type 2 diabetes mellitus with ketoacidosis without coma: Secondary | ICD-10-CM | POA: Diagnosis not present

## 2018-07-25 DIAGNOSIS — F23 Brief psychotic disorder: Secondary | ICD-10-CM | POA: Diagnosis not present

## 2018-07-25 DIAGNOSIS — R8290 Unspecified abnormal findings in urine: Secondary | ICD-10-CM | POA: Diagnosis not present

## 2018-07-26 DIAGNOSIS — J9601 Acute respiratory failure with hypoxia: Secondary | ICD-10-CM | POA: Diagnosis not present

## 2018-07-26 DIAGNOSIS — E1110 Type 2 diabetes mellitus with ketoacidosis without coma: Secondary | ICD-10-CM | POA: Diagnosis not present

## 2018-07-26 DIAGNOSIS — F23 Brief psychotic disorder: Secondary | ICD-10-CM | POA: Diagnosis not present

## 2018-07-26 DIAGNOSIS — R8290 Unspecified abnormal findings in urine: Secondary | ICD-10-CM | POA: Diagnosis not present

## 2018-07-27 DIAGNOSIS — J9601 Acute respiratory failure with hypoxia: Secondary | ICD-10-CM | POA: Diagnosis not present

## 2018-07-27 DIAGNOSIS — R8290 Unspecified abnormal findings in urine: Secondary | ICD-10-CM | POA: Diagnosis not present

## 2018-07-27 DIAGNOSIS — F23 Brief psychotic disorder: Secondary | ICD-10-CM | POA: Diagnosis not present

## 2018-07-27 DIAGNOSIS — E1110 Type 2 diabetes mellitus with ketoacidosis without coma: Secondary | ICD-10-CM | POA: Diagnosis not present

## 2018-07-28 DIAGNOSIS — J9601 Acute respiratory failure with hypoxia: Secondary | ICD-10-CM | POA: Diagnosis not present

## 2018-07-28 DIAGNOSIS — R8290 Unspecified abnormal findings in urine: Secondary | ICD-10-CM | POA: Diagnosis not present

## 2018-07-28 DIAGNOSIS — F23 Brief psychotic disorder: Secondary | ICD-10-CM | POA: Diagnosis not present

## 2018-07-28 DIAGNOSIS — E1110 Type 2 diabetes mellitus with ketoacidosis without coma: Secondary | ICD-10-CM | POA: Diagnosis not present

## 2018-07-29 DIAGNOSIS — F23 Brief psychotic disorder: Secondary | ICD-10-CM | POA: Diagnosis not present

## 2018-07-29 DIAGNOSIS — E1110 Type 2 diabetes mellitus with ketoacidosis without coma: Secondary | ICD-10-CM | POA: Diagnosis not present

## 2018-07-29 DIAGNOSIS — R8290 Unspecified abnormal findings in urine: Secondary | ICD-10-CM | POA: Diagnosis not present

## 2018-07-29 DIAGNOSIS — J9601 Acute respiratory failure with hypoxia: Secondary | ICD-10-CM | POA: Diagnosis not present

## 2018-07-30 DIAGNOSIS — R8290 Unspecified abnormal findings in urine: Secondary | ICD-10-CM | POA: Diagnosis not present

## 2018-07-30 DIAGNOSIS — E1110 Type 2 diabetes mellitus with ketoacidosis without coma: Secondary | ICD-10-CM | POA: Diagnosis not present

## 2018-07-30 DIAGNOSIS — J9601 Acute respiratory failure with hypoxia: Secondary | ICD-10-CM | POA: Diagnosis not present

## 2018-07-30 DIAGNOSIS — F23 Brief psychotic disorder: Secondary | ICD-10-CM | POA: Diagnosis not present

## 2018-07-31 DIAGNOSIS — E1110 Type 2 diabetes mellitus with ketoacidosis without coma: Secondary | ICD-10-CM | POA: Diagnosis not present

## 2018-07-31 DIAGNOSIS — J9601 Acute respiratory failure with hypoxia: Secondary | ICD-10-CM | POA: Diagnosis not present

## 2018-07-31 DIAGNOSIS — F23 Brief psychotic disorder: Secondary | ICD-10-CM | POA: Diagnosis not present

## 2018-07-31 DIAGNOSIS — R8290 Unspecified abnormal findings in urine: Secondary | ICD-10-CM | POA: Diagnosis not present

## 2018-08-01 DIAGNOSIS — F23 Brief psychotic disorder: Secondary | ICD-10-CM | POA: Diagnosis not present

## 2018-08-01 DIAGNOSIS — R8290 Unspecified abnormal findings in urine: Secondary | ICD-10-CM | POA: Diagnosis not present

## 2018-08-01 DIAGNOSIS — J9601 Acute respiratory failure with hypoxia: Secondary | ICD-10-CM | POA: Diagnosis not present

## 2018-08-01 DIAGNOSIS — E1110 Type 2 diabetes mellitus with ketoacidosis without coma: Secondary | ICD-10-CM | POA: Diagnosis not present

## 2018-08-02 DIAGNOSIS — J9601 Acute respiratory failure with hypoxia: Secondary | ICD-10-CM | POA: Diagnosis not present

## 2018-08-02 DIAGNOSIS — R8290 Unspecified abnormal findings in urine: Secondary | ICD-10-CM | POA: Diagnosis not present

## 2018-08-02 DIAGNOSIS — F23 Brief psychotic disorder: Secondary | ICD-10-CM | POA: Diagnosis not present

## 2018-08-02 DIAGNOSIS — E1110 Type 2 diabetes mellitus with ketoacidosis without coma: Secondary | ICD-10-CM | POA: Diagnosis not present

## 2018-08-03 DIAGNOSIS — E1110 Type 2 diabetes mellitus with ketoacidosis without coma: Secondary | ICD-10-CM | POA: Diagnosis not present

## 2018-08-03 DIAGNOSIS — J9601 Acute respiratory failure with hypoxia: Secondary | ICD-10-CM | POA: Diagnosis not present

## 2018-08-03 DIAGNOSIS — F23 Brief psychotic disorder: Secondary | ICD-10-CM | POA: Diagnosis not present

## 2018-08-03 DIAGNOSIS — R8290 Unspecified abnormal findings in urine: Secondary | ICD-10-CM | POA: Diagnosis not present

## 2018-08-04 DIAGNOSIS — E1110 Type 2 diabetes mellitus with ketoacidosis without coma: Secondary | ICD-10-CM | POA: Diagnosis not present

## 2018-08-04 DIAGNOSIS — J9601 Acute respiratory failure with hypoxia: Secondary | ICD-10-CM | POA: Diagnosis not present

## 2018-08-04 DIAGNOSIS — F23 Brief psychotic disorder: Secondary | ICD-10-CM | POA: Diagnosis not present

## 2018-08-04 DIAGNOSIS — R8290 Unspecified abnormal findings in urine: Secondary | ICD-10-CM | POA: Diagnosis not present

## 2018-08-05 DIAGNOSIS — J9601 Acute respiratory failure with hypoxia: Secondary | ICD-10-CM | POA: Diagnosis not present

## 2018-08-05 DIAGNOSIS — R8290 Unspecified abnormal findings in urine: Secondary | ICD-10-CM | POA: Diagnosis not present

## 2018-08-05 DIAGNOSIS — F23 Brief psychotic disorder: Secondary | ICD-10-CM | POA: Diagnosis not present

## 2018-08-05 DIAGNOSIS — E1110 Type 2 diabetes mellitus with ketoacidosis without coma: Secondary | ICD-10-CM | POA: Diagnosis not present

## 2018-08-06 DIAGNOSIS — J9601 Acute respiratory failure with hypoxia: Secondary | ICD-10-CM | POA: Diagnosis not present

## 2018-08-06 DIAGNOSIS — F23 Brief psychotic disorder: Secondary | ICD-10-CM | POA: Diagnosis not present

## 2018-08-06 DIAGNOSIS — E1110 Type 2 diabetes mellitus with ketoacidosis without coma: Secondary | ICD-10-CM | POA: Diagnosis not present

## 2018-08-06 DIAGNOSIS — R8290 Unspecified abnormal findings in urine: Secondary | ICD-10-CM | POA: Diagnosis not present

## 2018-08-07 DIAGNOSIS — E1110 Type 2 diabetes mellitus with ketoacidosis without coma: Secondary | ICD-10-CM | POA: Diagnosis not present

## 2018-08-07 DIAGNOSIS — J9601 Acute respiratory failure with hypoxia: Secondary | ICD-10-CM | POA: Diagnosis not present

## 2018-08-07 DIAGNOSIS — R8290 Unspecified abnormal findings in urine: Secondary | ICD-10-CM | POA: Diagnosis not present

## 2018-08-07 DIAGNOSIS — F23 Brief psychotic disorder: Secondary | ICD-10-CM | POA: Diagnosis not present

## 2018-08-08 DIAGNOSIS — F23 Brief psychotic disorder: Secondary | ICD-10-CM | POA: Diagnosis not present

## 2018-08-08 DIAGNOSIS — R8290 Unspecified abnormal findings in urine: Secondary | ICD-10-CM | POA: Diagnosis not present

## 2018-08-08 DIAGNOSIS — E1110 Type 2 diabetes mellitus with ketoacidosis without coma: Secondary | ICD-10-CM | POA: Diagnosis not present

## 2018-08-08 DIAGNOSIS — J9601 Acute respiratory failure with hypoxia: Secondary | ICD-10-CM | POA: Diagnosis not present

## 2018-08-09 DIAGNOSIS — J9601 Acute respiratory failure with hypoxia: Secondary | ICD-10-CM | POA: Diagnosis not present

## 2018-08-09 DIAGNOSIS — F23 Brief psychotic disorder: Secondary | ICD-10-CM | POA: Diagnosis not present

## 2018-08-09 DIAGNOSIS — E1110 Type 2 diabetes mellitus with ketoacidosis without coma: Secondary | ICD-10-CM | POA: Diagnosis not present

## 2018-08-09 DIAGNOSIS — R8290 Unspecified abnormal findings in urine: Secondary | ICD-10-CM | POA: Diagnosis not present

## 2018-08-10 DIAGNOSIS — R8290 Unspecified abnormal findings in urine: Secondary | ICD-10-CM | POA: Diagnosis not present

## 2018-08-10 DIAGNOSIS — J9601 Acute respiratory failure with hypoxia: Secondary | ICD-10-CM | POA: Diagnosis not present

## 2018-08-10 DIAGNOSIS — F23 Brief psychotic disorder: Secondary | ICD-10-CM | POA: Diagnosis not present

## 2018-08-10 DIAGNOSIS — E1110 Type 2 diabetes mellitus with ketoacidosis without coma: Secondary | ICD-10-CM | POA: Diagnosis not present

## 2018-08-11 DIAGNOSIS — J9601 Acute respiratory failure with hypoxia: Secondary | ICD-10-CM | POA: Diagnosis not present

## 2018-08-11 DIAGNOSIS — R8290 Unspecified abnormal findings in urine: Secondary | ICD-10-CM | POA: Diagnosis not present

## 2018-08-11 DIAGNOSIS — E1110 Type 2 diabetes mellitus with ketoacidosis without coma: Secondary | ICD-10-CM | POA: Diagnosis not present

## 2018-08-11 DIAGNOSIS — F23 Brief psychotic disorder: Secondary | ICD-10-CM | POA: Diagnosis not present

## 2018-08-12 DIAGNOSIS — J9601 Acute respiratory failure with hypoxia: Secondary | ICD-10-CM | POA: Diagnosis not present

## 2018-08-12 DIAGNOSIS — R8290 Unspecified abnormal findings in urine: Secondary | ICD-10-CM | POA: Diagnosis not present

## 2018-08-12 DIAGNOSIS — E1110 Type 2 diabetes mellitus with ketoacidosis without coma: Secondary | ICD-10-CM | POA: Diagnosis not present

## 2018-08-12 DIAGNOSIS — F23 Brief psychotic disorder: Secondary | ICD-10-CM | POA: Diagnosis not present

## 2018-08-13 DIAGNOSIS — R8290 Unspecified abnormal findings in urine: Secondary | ICD-10-CM | POA: Diagnosis not present

## 2018-08-13 DIAGNOSIS — E1110 Type 2 diabetes mellitus with ketoacidosis without coma: Secondary | ICD-10-CM | POA: Diagnosis not present

## 2018-08-13 DIAGNOSIS — F23 Brief psychotic disorder: Secondary | ICD-10-CM | POA: Diagnosis not present

## 2018-08-13 DIAGNOSIS — J9601 Acute respiratory failure with hypoxia: Secondary | ICD-10-CM | POA: Diagnosis not present

## 2018-08-14 DIAGNOSIS — F23 Brief psychotic disorder: Secondary | ICD-10-CM | POA: Diagnosis not present

## 2018-08-14 DIAGNOSIS — R8290 Unspecified abnormal findings in urine: Secondary | ICD-10-CM | POA: Diagnosis not present

## 2018-08-14 DIAGNOSIS — J9601 Acute respiratory failure with hypoxia: Secondary | ICD-10-CM | POA: Diagnosis not present

## 2018-08-14 DIAGNOSIS — E1110 Type 2 diabetes mellitus with ketoacidosis without coma: Secondary | ICD-10-CM | POA: Diagnosis not present

## 2018-08-15 DIAGNOSIS — R8290 Unspecified abnormal findings in urine: Secondary | ICD-10-CM | POA: Diagnosis not present

## 2018-08-15 DIAGNOSIS — F23 Brief psychotic disorder: Secondary | ICD-10-CM | POA: Diagnosis not present

## 2018-08-15 DIAGNOSIS — E1110 Type 2 diabetes mellitus with ketoacidosis without coma: Secondary | ICD-10-CM | POA: Diagnosis not present

## 2018-08-15 DIAGNOSIS — J9601 Acute respiratory failure with hypoxia: Secondary | ICD-10-CM | POA: Diagnosis not present

## 2018-08-16 DIAGNOSIS — R8290 Unspecified abnormal findings in urine: Secondary | ICD-10-CM | POA: Diagnosis not present

## 2018-08-16 DIAGNOSIS — F23 Brief psychotic disorder: Secondary | ICD-10-CM | POA: Diagnosis not present

## 2018-08-16 DIAGNOSIS — J9601 Acute respiratory failure with hypoxia: Secondary | ICD-10-CM | POA: Diagnosis not present

## 2018-08-16 DIAGNOSIS — E1110 Type 2 diabetes mellitus with ketoacidosis without coma: Secondary | ICD-10-CM | POA: Diagnosis not present

## 2018-08-17 DIAGNOSIS — R8290 Unspecified abnormal findings in urine: Secondary | ICD-10-CM | POA: Diagnosis not present

## 2018-08-17 DIAGNOSIS — J9601 Acute respiratory failure with hypoxia: Secondary | ICD-10-CM | POA: Diagnosis not present

## 2018-08-17 DIAGNOSIS — E1110 Type 2 diabetes mellitus with ketoacidosis without coma: Secondary | ICD-10-CM | POA: Diagnosis not present

## 2018-08-17 DIAGNOSIS — F23 Brief psychotic disorder: Secondary | ICD-10-CM | POA: Diagnosis not present

## 2018-08-18 DIAGNOSIS — J9601 Acute respiratory failure with hypoxia: Secondary | ICD-10-CM | POA: Diagnosis not present

## 2018-08-18 DIAGNOSIS — R8290 Unspecified abnormal findings in urine: Secondary | ICD-10-CM | POA: Diagnosis not present

## 2018-08-18 DIAGNOSIS — F23 Brief psychotic disorder: Secondary | ICD-10-CM | POA: Diagnosis not present

## 2018-08-18 DIAGNOSIS — E1110 Type 2 diabetes mellitus with ketoacidosis without coma: Secondary | ICD-10-CM | POA: Diagnosis not present

## 2018-08-19 DIAGNOSIS — R8290 Unspecified abnormal findings in urine: Secondary | ICD-10-CM | POA: Diagnosis not present

## 2018-08-19 DIAGNOSIS — F23 Brief psychotic disorder: Secondary | ICD-10-CM | POA: Diagnosis not present

## 2018-08-19 DIAGNOSIS — E1110 Type 2 diabetes mellitus with ketoacidosis without coma: Secondary | ICD-10-CM | POA: Diagnosis not present

## 2018-08-19 DIAGNOSIS — J9601 Acute respiratory failure with hypoxia: Secondary | ICD-10-CM | POA: Diagnosis not present

## 2018-08-20 DIAGNOSIS — J9601 Acute respiratory failure with hypoxia: Secondary | ICD-10-CM | POA: Diagnosis not present

## 2018-08-20 DIAGNOSIS — F23 Brief psychotic disorder: Secondary | ICD-10-CM | POA: Diagnosis not present

## 2018-08-20 DIAGNOSIS — E1110 Type 2 diabetes mellitus with ketoacidosis without coma: Secondary | ICD-10-CM | POA: Diagnosis not present

## 2018-08-20 DIAGNOSIS — R8290 Unspecified abnormal findings in urine: Secondary | ICD-10-CM | POA: Diagnosis not present

## 2018-08-21 DIAGNOSIS — E1110 Type 2 diabetes mellitus with ketoacidosis without coma: Secondary | ICD-10-CM | POA: Diagnosis not present

## 2018-08-21 DIAGNOSIS — J9601 Acute respiratory failure with hypoxia: Secondary | ICD-10-CM | POA: Diagnosis not present

## 2018-08-21 DIAGNOSIS — R8290 Unspecified abnormal findings in urine: Secondary | ICD-10-CM | POA: Diagnosis not present

## 2018-08-21 DIAGNOSIS — F23 Brief psychotic disorder: Secondary | ICD-10-CM | POA: Diagnosis not present

## 2018-08-22 DIAGNOSIS — J9601 Acute respiratory failure with hypoxia: Secondary | ICD-10-CM | POA: Diagnosis not present

## 2018-08-22 DIAGNOSIS — E1110 Type 2 diabetes mellitus with ketoacidosis without coma: Secondary | ICD-10-CM | POA: Diagnosis not present

## 2018-08-22 DIAGNOSIS — R8290 Unspecified abnormal findings in urine: Secondary | ICD-10-CM | POA: Diagnosis not present

## 2018-08-22 DIAGNOSIS — F23 Brief psychotic disorder: Secondary | ICD-10-CM | POA: Diagnosis not present

## 2018-08-23 DIAGNOSIS — J9601 Acute respiratory failure with hypoxia: Secondary | ICD-10-CM | POA: Diagnosis not present

## 2018-08-23 DIAGNOSIS — R8290 Unspecified abnormal findings in urine: Secondary | ICD-10-CM | POA: Diagnosis not present

## 2018-08-23 DIAGNOSIS — E1110 Type 2 diabetes mellitus with ketoacidosis without coma: Secondary | ICD-10-CM | POA: Diagnosis not present

## 2018-08-23 DIAGNOSIS — F23 Brief psychotic disorder: Secondary | ICD-10-CM | POA: Diagnosis not present

## 2018-08-24 DIAGNOSIS — R8290 Unspecified abnormal findings in urine: Secondary | ICD-10-CM

## 2018-08-24 DIAGNOSIS — E785 Hyperlipidemia, unspecified: Secondary | ICD-10-CM

## 2018-08-24 DIAGNOSIS — E119 Type 2 diabetes mellitus without complications: Secondary | ICD-10-CM

## 2018-08-24 DIAGNOSIS — E1010 Type 1 diabetes mellitus with ketoacidosis without coma: Secondary | ICD-10-CM

## 2018-08-24 DIAGNOSIS — I10 Essential (primary) hypertension: Secondary | ICD-10-CM

## 2018-08-24 DIAGNOSIS — J9601 Acute respiratory failure with hypoxia: Secondary | ICD-10-CM

## 2018-08-24 DIAGNOSIS — F209 Schizophrenia, unspecified: Secondary | ICD-10-CM

## 2018-08-24 DIAGNOSIS — F320 Major depressive disorder, single episode, mild: Secondary | ICD-10-CM | POA: Diagnosis not present

## 2018-08-25 DIAGNOSIS — F320 Major depressive disorder, single episode, mild: Secondary | ICD-10-CM | POA: Diagnosis not present

## 2018-08-25 DIAGNOSIS — R8290 Unspecified abnormal findings in urine: Secondary | ICD-10-CM | POA: Diagnosis not present

## 2018-08-25 DIAGNOSIS — J9601 Acute respiratory failure with hypoxia: Secondary | ICD-10-CM | POA: Diagnosis not present

## 2018-08-25 DIAGNOSIS — E1010 Type 1 diabetes mellitus with ketoacidosis without coma: Secondary | ICD-10-CM | POA: Diagnosis not present

## 2018-08-26 DIAGNOSIS — J9601 Acute respiratory failure with hypoxia: Secondary | ICD-10-CM | POA: Diagnosis not present

## 2018-08-26 DIAGNOSIS — R8290 Unspecified abnormal findings in urine: Secondary | ICD-10-CM | POA: Diagnosis not present

## 2018-08-26 DIAGNOSIS — E1010 Type 1 diabetes mellitus with ketoacidosis without coma: Secondary | ICD-10-CM | POA: Diagnosis not present

## 2018-08-26 DIAGNOSIS — F320 Major depressive disorder, single episode, mild: Secondary | ICD-10-CM | POA: Diagnosis not present

## 2018-08-27 DIAGNOSIS — E1010 Type 1 diabetes mellitus with ketoacidosis without coma: Secondary | ICD-10-CM | POA: Diagnosis not present

## 2018-08-27 DIAGNOSIS — R8290 Unspecified abnormal findings in urine: Secondary | ICD-10-CM | POA: Diagnosis not present

## 2018-08-27 DIAGNOSIS — F320 Major depressive disorder, single episode, mild: Secondary | ICD-10-CM | POA: Diagnosis not present

## 2018-08-27 DIAGNOSIS — J9601 Acute respiratory failure with hypoxia: Secondary | ICD-10-CM | POA: Diagnosis not present

## 2018-08-28 DIAGNOSIS — E1010 Type 1 diabetes mellitus with ketoacidosis without coma: Secondary | ICD-10-CM | POA: Diagnosis not present

## 2018-08-28 DIAGNOSIS — R8290 Unspecified abnormal findings in urine: Secondary | ICD-10-CM | POA: Diagnosis not present

## 2018-08-28 DIAGNOSIS — J9601 Acute respiratory failure with hypoxia: Secondary | ICD-10-CM | POA: Diagnosis not present

## 2018-08-28 DIAGNOSIS — F320 Major depressive disorder, single episode, mild: Secondary | ICD-10-CM | POA: Diagnosis not present

## 2018-08-29 DIAGNOSIS — R8290 Unspecified abnormal findings in urine: Secondary | ICD-10-CM | POA: Diagnosis not present

## 2018-08-29 DIAGNOSIS — E1010 Type 1 diabetes mellitus with ketoacidosis without coma: Secondary | ICD-10-CM | POA: Diagnosis not present

## 2018-08-29 DIAGNOSIS — J9601 Acute respiratory failure with hypoxia: Secondary | ICD-10-CM | POA: Diagnosis not present

## 2018-08-29 DIAGNOSIS — F320 Major depressive disorder, single episode, mild: Secondary | ICD-10-CM | POA: Diagnosis not present

## 2018-08-30 DIAGNOSIS — E1010 Type 1 diabetes mellitus with ketoacidosis without coma: Secondary | ICD-10-CM | POA: Diagnosis not present

## 2018-08-30 DIAGNOSIS — F320 Major depressive disorder, single episode, mild: Secondary | ICD-10-CM | POA: Diagnosis not present

## 2018-08-30 DIAGNOSIS — J9601 Acute respiratory failure with hypoxia: Secondary | ICD-10-CM | POA: Diagnosis not present

## 2018-08-30 DIAGNOSIS — R8290 Unspecified abnormal findings in urine: Secondary | ICD-10-CM | POA: Diagnosis not present

## 2018-08-31 DIAGNOSIS — R8290 Unspecified abnormal findings in urine: Secondary | ICD-10-CM | POA: Diagnosis not present

## 2018-08-31 DIAGNOSIS — E1010 Type 1 diabetes mellitus with ketoacidosis without coma: Secondary | ICD-10-CM | POA: Diagnosis not present

## 2018-08-31 DIAGNOSIS — F320 Major depressive disorder, single episode, mild: Secondary | ICD-10-CM | POA: Diagnosis not present

## 2018-08-31 DIAGNOSIS — J9601 Acute respiratory failure with hypoxia: Secondary | ICD-10-CM | POA: Diagnosis not present

## 2018-09-01 DIAGNOSIS — E1010 Type 1 diabetes mellitus with ketoacidosis without coma: Secondary | ICD-10-CM | POA: Diagnosis not present

## 2018-09-01 DIAGNOSIS — R8290 Unspecified abnormal findings in urine: Secondary | ICD-10-CM | POA: Diagnosis not present

## 2018-09-01 DIAGNOSIS — J9601 Acute respiratory failure with hypoxia: Secondary | ICD-10-CM | POA: Diagnosis not present

## 2018-09-01 DIAGNOSIS — F320 Major depressive disorder, single episode, mild: Secondary | ICD-10-CM | POA: Diagnosis not present

## 2018-09-02 DIAGNOSIS — E1010 Type 1 diabetes mellitus with ketoacidosis without coma: Secondary | ICD-10-CM | POA: Diagnosis not present

## 2018-09-02 DIAGNOSIS — R8290 Unspecified abnormal findings in urine: Secondary | ICD-10-CM | POA: Diagnosis not present

## 2018-09-02 DIAGNOSIS — F320 Major depressive disorder, single episode, mild: Secondary | ICD-10-CM | POA: Diagnosis not present

## 2018-09-02 DIAGNOSIS — J9601 Acute respiratory failure with hypoxia: Secondary | ICD-10-CM | POA: Diagnosis not present

## 2018-09-03 DIAGNOSIS — E1010 Type 1 diabetes mellitus with ketoacidosis without coma: Secondary | ICD-10-CM | POA: Diagnosis not present

## 2018-09-03 DIAGNOSIS — J9601 Acute respiratory failure with hypoxia: Secondary | ICD-10-CM | POA: Diagnosis not present

## 2018-09-03 DIAGNOSIS — F320 Major depressive disorder, single episode, mild: Secondary | ICD-10-CM | POA: Diagnosis not present

## 2018-09-03 DIAGNOSIS — R8290 Unspecified abnormal findings in urine: Secondary | ICD-10-CM | POA: Diagnosis not present

## 2018-09-04 DIAGNOSIS — J9601 Acute respiratory failure with hypoxia: Secondary | ICD-10-CM | POA: Diagnosis not present

## 2018-09-04 DIAGNOSIS — F320 Major depressive disorder, single episode, mild: Secondary | ICD-10-CM | POA: Diagnosis not present

## 2018-09-04 DIAGNOSIS — E1010 Type 1 diabetes mellitus with ketoacidosis without coma: Secondary | ICD-10-CM | POA: Diagnosis not present

## 2018-09-04 DIAGNOSIS — R8290 Unspecified abnormal findings in urine: Secondary | ICD-10-CM | POA: Diagnosis not present

## 2018-09-05 DIAGNOSIS — E1010 Type 1 diabetes mellitus with ketoacidosis without coma: Secondary | ICD-10-CM | POA: Diagnosis not present

## 2018-09-05 DIAGNOSIS — R8290 Unspecified abnormal findings in urine: Secondary | ICD-10-CM | POA: Diagnosis not present

## 2018-09-05 DIAGNOSIS — J9601 Acute respiratory failure with hypoxia: Secondary | ICD-10-CM | POA: Diagnosis not present

## 2018-09-05 DIAGNOSIS — F320 Major depressive disorder, single episode, mild: Secondary | ICD-10-CM | POA: Diagnosis not present

## 2018-09-06 DIAGNOSIS — E1010 Type 1 diabetes mellitus with ketoacidosis without coma: Secondary | ICD-10-CM | POA: Diagnosis not present

## 2018-09-06 DIAGNOSIS — R8290 Unspecified abnormal findings in urine: Secondary | ICD-10-CM | POA: Diagnosis not present

## 2018-09-06 DIAGNOSIS — J9601 Acute respiratory failure with hypoxia: Secondary | ICD-10-CM | POA: Diagnosis not present

## 2018-09-06 DIAGNOSIS — F320 Major depressive disorder, single episode, mild: Secondary | ICD-10-CM | POA: Diagnosis not present

## 2018-09-07 DIAGNOSIS — E1010 Type 1 diabetes mellitus with ketoacidosis without coma: Secondary | ICD-10-CM | POA: Diagnosis not present

## 2018-09-07 DIAGNOSIS — J9601 Acute respiratory failure with hypoxia: Secondary | ICD-10-CM | POA: Diagnosis not present

## 2018-09-07 DIAGNOSIS — R8290 Unspecified abnormal findings in urine: Secondary | ICD-10-CM | POA: Diagnosis not present

## 2018-09-07 DIAGNOSIS — F320 Major depressive disorder, single episode, mild: Secondary | ICD-10-CM | POA: Diagnosis not present

## 2018-09-08 DIAGNOSIS — F320 Major depressive disorder, single episode, mild: Secondary | ICD-10-CM | POA: Diagnosis not present

## 2018-09-08 DIAGNOSIS — R8290 Unspecified abnormal findings in urine: Secondary | ICD-10-CM | POA: Diagnosis not present

## 2018-09-08 DIAGNOSIS — J9601 Acute respiratory failure with hypoxia: Secondary | ICD-10-CM | POA: Diagnosis not present

## 2018-09-08 DIAGNOSIS — E1010 Type 1 diabetes mellitus with ketoacidosis without coma: Secondary | ICD-10-CM | POA: Diagnosis not present

## 2018-09-09 DIAGNOSIS — F320 Major depressive disorder, single episode, mild: Secondary | ICD-10-CM | POA: Diagnosis not present

## 2018-09-09 DIAGNOSIS — E1010 Type 1 diabetes mellitus with ketoacidosis without coma: Secondary | ICD-10-CM | POA: Diagnosis not present

## 2018-09-09 DIAGNOSIS — J9601 Acute respiratory failure with hypoxia: Secondary | ICD-10-CM | POA: Diagnosis not present

## 2018-09-09 DIAGNOSIS — R8290 Unspecified abnormal findings in urine: Secondary | ICD-10-CM | POA: Diagnosis not present

## 2018-09-10 DIAGNOSIS — R8290 Unspecified abnormal findings in urine: Secondary | ICD-10-CM | POA: Diagnosis not present

## 2018-09-10 DIAGNOSIS — F320 Major depressive disorder, single episode, mild: Secondary | ICD-10-CM | POA: Diagnosis not present

## 2018-09-10 DIAGNOSIS — E1010 Type 1 diabetes mellitus with ketoacidosis without coma: Secondary | ICD-10-CM | POA: Diagnosis not present

## 2018-09-10 DIAGNOSIS — J9601 Acute respiratory failure with hypoxia: Secondary | ICD-10-CM | POA: Diagnosis not present

## 2018-09-11 DIAGNOSIS — R8290 Unspecified abnormal findings in urine: Secondary | ICD-10-CM | POA: Diagnosis not present

## 2018-09-11 DIAGNOSIS — F320 Major depressive disorder, single episode, mild: Secondary | ICD-10-CM | POA: Diagnosis not present

## 2018-09-11 DIAGNOSIS — E1010 Type 1 diabetes mellitus with ketoacidosis without coma: Secondary | ICD-10-CM | POA: Diagnosis not present

## 2018-09-11 DIAGNOSIS — J9601 Acute respiratory failure with hypoxia: Secondary | ICD-10-CM | POA: Diagnosis not present

## 2018-09-12 DIAGNOSIS — E1010 Type 1 diabetes mellitus with ketoacidosis without coma: Secondary | ICD-10-CM | POA: Diagnosis not present

## 2018-09-12 DIAGNOSIS — J9601 Acute respiratory failure with hypoxia: Secondary | ICD-10-CM | POA: Diagnosis not present

## 2018-09-12 DIAGNOSIS — F320 Major depressive disorder, single episode, mild: Secondary | ICD-10-CM | POA: Diagnosis not present

## 2018-09-12 DIAGNOSIS — R8290 Unspecified abnormal findings in urine: Secondary | ICD-10-CM | POA: Diagnosis not present

## 2018-09-13 DIAGNOSIS — J9601 Acute respiratory failure with hypoxia: Secondary | ICD-10-CM | POA: Diagnosis not present

## 2018-09-13 DIAGNOSIS — F320 Major depressive disorder, single episode, mild: Secondary | ICD-10-CM | POA: Diagnosis not present

## 2018-09-13 DIAGNOSIS — E1010 Type 1 diabetes mellitus with ketoacidosis without coma: Secondary | ICD-10-CM | POA: Diagnosis not present

## 2018-09-13 DIAGNOSIS — R8290 Unspecified abnormal findings in urine: Secondary | ICD-10-CM | POA: Diagnosis not present

## 2018-09-14 DIAGNOSIS — J9601 Acute respiratory failure with hypoxia: Secondary | ICD-10-CM | POA: Diagnosis not present

## 2018-09-14 DIAGNOSIS — F320 Major depressive disorder, single episode, mild: Secondary | ICD-10-CM | POA: Diagnosis not present

## 2018-09-14 DIAGNOSIS — E1010 Type 1 diabetes mellitus with ketoacidosis without coma: Secondary | ICD-10-CM | POA: Diagnosis not present

## 2018-09-14 DIAGNOSIS — R8290 Unspecified abnormal findings in urine: Secondary | ICD-10-CM | POA: Diagnosis not present

## 2018-09-15 DIAGNOSIS — F320 Major depressive disorder, single episode, mild: Secondary | ICD-10-CM | POA: Diagnosis not present

## 2018-09-15 DIAGNOSIS — R8290 Unspecified abnormal findings in urine: Secondary | ICD-10-CM | POA: Diagnosis not present

## 2018-09-15 DIAGNOSIS — E1010 Type 1 diabetes mellitus with ketoacidosis without coma: Secondary | ICD-10-CM | POA: Diagnosis not present

## 2018-09-15 DIAGNOSIS — J9601 Acute respiratory failure with hypoxia: Secondary | ICD-10-CM | POA: Diagnosis not present

## 2018-09-16 DIAGNOSIS — J9601 Acute respiratory failure with hypoxia: Secondary | ICD-10-CM | POA: Diagnosis not present

## 2018-09-16 DIAGNOSIS — R8290 Unspecified abnormal findings in urine: Secondary | ICD-10-CM | POA: Diagnosis not present

## 2018-09-16 DIAGNOSIS — F320 Major depressive disorder, single episode, mild: Secondary | ICD-10-CM | POA: Diagnosis not present

## 2018-09-16 DIAGNOSIS — E1010 Type 1 diabetes mellitus with ketoacidosis without coma: Secondary | ICD-10-CM | POA: Diagnosis not present

## 2018-09-17 DIAGNOSIS — E1010 Type 1 diabetes mellitus with ketoacidosis without coma: Secondary | ICD-10-CM | POA: Diagnosis not present

## 2018-09-17 DIAGNOSIS — J9601 Acute respiratory failure with hypoxia: Secondary | ICD-10-CM | POA: Diagnosis not present

## 2018-09-17 DIAGNOSIS — R8290 Unspecified abnormal findings in urine: Secondary | ICD-10-CM | POA: Diagnosis not present

## 2018-09-17 DIAGNOSIS — F320 Major depressive disorder, single episode, mild: Secondary | ICD-10-CM | POA: Diagnosis not present

## 2018-09-18 DIAGNOSIS — R8290 Unspecified abnormal findings in urine: Secondary | ICD-10-CM | POA: Diagnosis not present

## 2018-09-18 DIAGNOSIS — F320 Major depressive disorder, single episode, mild: Secondary | ICD-10-CM | POA: Diagnosis not present

## 2018-09-18 DIAGNOSIS — E1010 Type 1 diabetes mellitus with ketoacidosis without coma: Secondary | ICD-10-CM | POA: Diagnosis not present

## 2018-09-18 DIAGNOSIS — J9601 Acute respiratory failure with hypoxia: Secondary | ICD-10-CM | POA: Diagnosis not present

## 2018-09-19 DIAGNOSIS — J9601 Acute respiratory failure with hypoxia: Secondary | ICD-10-CM | POA: Diagnosis not present

## 2018-09-19 DIAGNOSIS — R8290 Unspecified abnormal findings in urine: Secondary | ICD-10-CM | POA: Diagnosis not present

## 2018-09-19 DIAGNOSIS — E1010 Type 1 diabetes mellitus with ketoacidosis without coma: Secondary | ICD-10-CM | POA: Diagnosis not present

## 2018-09-19 DIAGNOSIS — F320 Major depressive disorder, single episode, mild: Secondary | ICD-10-CM | POA: Diagnosis not present

## 2018-09-20 DIAGNOSIS — F320 Major depressive disorder, single episode, mild: Secondary | ICD-10-CM | POA: Diagnosis not present

## 2018-09-20 DIAGNOSIS — R8290 Unspecified abnormal findings in urine: Secondary | ICD-10-CM | POA: Diagnosis not present

## 2018-09-20 DIAGNOSIS — J9601 Acute respiratory failure with hypoxia: Secondary | ICD-10-CM | POA: Diagnosis not present

## 2018-09-20 DIAGNOSIS — E1010 Type 1 diabetes mellitus with ketoacidosis without coma: Secondary | ICD-10-CM | POA: Diagnosis not present

## 2018-09-21 DIAGNOSIS — F320 Major depressive disorder, single episode, mild: Secondary | ICD-10-CM | POA: Diagnosis not present

## 2018-09-21 DIAGNOSIS — R8290 Unspecified abnormal findings in urine: Secondary | ICD-10-CM | POA: Diagnosis not present

## 2018-09-21 DIAGNOSIS — J9601 Acute respiratory failure with hypoxia: Secondary | ICD-10-CM | POA: Diagnosis not present

## 2018-09-21 DIAGNOSIS — E1010 Type 1 diabetes mellitus with ketoacidosis without coma: Secondary | ICD-10-CM | POA: Diagnosis not present

## 2018-09-22 DIAGNOSIS — F320 Major depressive disorder, single episode, mild: Secondary | ICD-10-CM | POA: Diagnosis not present

## 2018-09-22 DIAGNOSIS — E1010 Type 1 diabetes mellitus with ketoacidosis without coma: Secondary | ICD-10-CM | POA: Diagnosis not present

## 2018-09-22 DIAGNOSIS — J9601 Acute respiratory failure with hypoxia: Secondary | ICD-10-CM | POA: Diagnosis not present

## 2018-09-22 DIAGNOSIS — R8290 Unspecified abnormal findings in urine: Secondary | ICD-10-CM | POA: Diagnosis not present

## 2018-09-23 DIAGNOSIS — E1010 Type 1 diabetes mellitus with ketoacidosis without coma: Secondary | ICD-10-CM | POA: Diagnosis not present

## 2018-09-23 DIAGNOSIS — R8290 Unspecified abnormal findings in urine: Secondary | ICD-10-CM | POA: Diagnosis not present

## 2018-09-23 DIAGNOSIS — F320 Major depressive disorder, single episode, mild: Secondary | ICD-10-CM | POA: Diagnosis not present

## 2018-09-23 DIAGNOSIS — J9601 Acute respiratory failure with hypoxia: Secondary | ICD-10-CM | POA: Diagnosis not present

## 2018-09-24 DIAGNOSIS — E1010 Type 1 diabetes mellitus with ketoacidosis without coma: Secondary | ICD-10-CM | POA: Diagnosis not present

## 2018-09-24 DIAGNOSIS — R8290 Unspecified abnormal findings in urine: Secondary | ICD-10-CM | POA: Diagnosis not present

## 2018-09-24 DIAGNOSIS — J9601 Acute respiratory failure with hypoxia: Secondary | ICD-10-CM | POA: Diagnosis not present

## 2018-09-24 DIAGNOSIS — F320 Major depressive disorder, single episode, mild: Secondary | ICD-10-CM | POA: Diagnosis not present

## 2018-09-25 DIAGNOSIS — R8290 Unspecified abnormal findings in urine: Secondary | ICD-10-CM | POA: Diagnosis not present

## 2018-09-25 DIAGNOSIS — J9601 Acute respiratory failure with hypoxia: Secondary | ICD-10-CM | POA: Diagnosis not present

## 2018-09-25 DIAGNOSIS — F320 Major depressive disorder, single episode, mild: Secondary | ICD-10-CM | POA: Diagnosis not present

## 2018-09-25 DIAGNOSIS — E1010 Type 1 diabetes mellitus with ketoacidosis without coma: Secondary | ICD-10-CM | POA: Diagnosis not present

## 2018-09-26 DIAGNOSIS — F320 Major depressive disorder, single episode, mild: Secondary | ICD-10-CM | POA: Diagnosis not present

## 2018-09-26 DIAGNOSIS — J9601 Acute respiratory failure with hypoxia: Secondary | ICD-10-CM | POA: Diagnosis not present

## 2018-09-26 DIAGNOSIS — E1010 Type 1 diabetes mellitus with ketoacidosis without coma: Secondary | ICD-10-CM | POA: Diagnosis not present

## 2018-09-26 DIAGNOSIS — R8290 Unspecified abnormal findings in urine: Secondary | ICD-10-CM | POA: Diagnosis not present

## 2018-09-27 DIAGNOSIS — E1010 Type 1 diabetes mellitus with ketoacidosis without coma: Secondary | ICD-10-CM | POA: Diagnosis not present

## 2018-09-27 DIAGNOSIS — F320 Major depressive disorder, single episode, mild: Secondary | ICD-10-CM | POA: Diagnosis not present

## 2018-09-27 DIAGNOSIS — R8290 Unspecified abnormal findings in urine: Secondary | ICD-10-CM | POA: Diagnosis not present

## 2018-09-27 DIAGNOSIS — J9601 Acute respiratory failure with hypoxia: Secondary | ICD-10-CM | POA: Diagnosis not present

## 2018-09-28 DIAGNOSIS — E1010 Type 1 diabetes mellitus with ketoacidosis without coma: Secondary | ICD-10-CM | POA: Diagnosis not present

## 2018-09-28 DIAGNOSIS — R8290 Unspecified abnormal findings in urine: Secondary | ICD-10-CM | POA: Diagnosis not present

## 2018-09-28 DIAGNOSIS — J9601 Acute respiratory failure with hypoxia: Secondary | ICD-10-CM | POA: Diagnosis not present

## 2018-09-28 DIAGNOSIS — F320 Major depressive disorder, single episode, mild: Secondary | ICD-10-CM | POA: Diagnosis not present

## 2018-09-29 DIAGNOSIS — J9601 Acute respiratory failure with hypoxia: Secondary | ICD-10-CM | POA: Diagnosis not present

## 2018-09-29 DIAGNOSIS — R8290 Unspecified abnormal findings in urine: Secondary | ICD-10-CM | POA: Diagnosis not present

## 2018-09-29 DIAGNOSIS — F320 Major depressive disorder, single episode, mild: Secondary | ICD-10-CM | POA: Diagnosis not present

## 2018-09-29 DIAGNOSIS — E1010 Type 1 diabetes mellitus with ketoacidosis without coma: Secondary | ICD-10-CM | POA: Diagnosis not present

## 2018-09-30 DIAGNOSIS — J9601 Acute respiratory failure with hypoxia: Secondary | ICD-10-CM | POA: Diagnosis not present

## 2018-09-30 DIAGNOSIS — E1010 Type 1 diabetes mellitus with ketoacidosis without coma: Secondary | ICD-10-CM | POA: Diagnosis not present

## 2018-09-30 DIAGNOSIS — F320 Major depressive disorder, single episode, mild: Secondary | ICD-10-CM | POA: Diagnosis not present

## 2018-09-30 DIAGNOSIS — R8290 Unspecified abnormal findings in urine: Secondary | ICD-10-CM | POA: Diagnosis not present

## 2018-10-01 DIAGNOSIS — R8290 Unspecified abnormal findings in urine: Secondary | ICD-10-CM | POA: Diagnosis not present

## 2018-10-01 DIAGNOSIS — J9601 Acute respiratory failure with hypoxia: Secondary | ICD-10-CM | POA: Diagnosis not present

## 2018-10-01 DIAGNOSIS — E1010 Type 1 diabetes mellitus with ketoacidosis without coma: Secondary | ICD-10-CM | POA: Diagnosis not present

## 2018-10-01 DIAGNOSIS — F320 Major depressive disorder, single episode, mild: Secondary | ICD-10-CM | POA: Diagnosis not present

## 2018-10-02 DIAGNOSIS — J9601 Acute respiratory failure with hypoxia: Secondary | ICD-10-CM | POA: Diagnosis not present

## 2018-10-02 DIAGNOSIS — E1010 Type 1 diabetes mellitus with ketoacidosis without coma: Secondary | ICD-10-CM | POA: Diagnosis not present

## 2018-10-02 DIAGNOSIS — F320 Major depressive disorder, single episode, mild: Secondary | ICD-10-CM | POA: Diagnosis not present

## 2018-10-02 DIAGNOSIS — R8290 Unspecified abnormal findings in urine: Secondary | ICD-10-CM | POA: Diagnosis not present

## 2018-10-03 DIAGNOSIS — J9601 Acute respiratory failure with hypoxia: Secondary | ICD-10-CM | POA: Diagnosis not present

## 2018-10-03 DIAGNOSIS — R8290 Unspecified abnormal findings in urine: Secondary | ICD-10-CM | POA: Diagnosis not present

## 2018-10-03 DIAGNOSIS — E1010 Type 1 diabetes mellitus with ketoacidosis without coma: Secondary | ICD-10-CM | POA: Diagnosis not present

## 2018-10-03 DIAGNOSIS — F320 Major depressive disorder, single episode, mild: Secondary | ICD-10-CM | POA: Diagnosis not present

## 2018-10-04 DIAGNOSIS — E1010 Type 1 diabetes mellitus with ketoacidosis without coma: Secondary | ICD-10-CM | POA: Diagnosis not present

## 2018-10-04 DIAGNOSIS — J9601 Acute respiratory failure with hypoxia: Secondary | ICD-10-CM | POA: Diagnosis not present

## 2018-10-04 DIAGNOSIS — R8290 Unspecified abnormal findings in urine: Secondary | ICD-10-CM | POA: Diagnosis not present

## 2018-10-04 DIAGNOSIS — F320 Major depressive disorder, single episode, mild: Secondary | ICD-10-CM | POA: Diagnosis not present

## 2018-10-05 DIAGNOSIS — E1010 Type 1 diabetes mellitus with ketoacidosis without coma: Secondary | ICD-10-CM | POA: Diagnosis not present

## 2018-10-05 DIAGNOSIS — J9601 Acute respiratory failure with hypoxia: Secondary | ICD-10-CM | POA: Diagnosis not present

## 2018-10-05 DIAGNOSIS — R8290 Unspecified abnormal findings in urine: Secondary | ICD-10-CM | POA: Diagnosis not present

## 2018-10-05 DIAGNOSIS — F320 Major depressive disorder, single episode, mild: Secondary | ICD-10-CM | POA: Diagnosis not present

## 2018-10-06 DIAGNOSIS — R8290 Unspecified abnormal findings in urine: Secondary | ICD-10-CM | POA: Diagnosis not present

## 2018-10-06 DIAGNOSIS — E1010 Type 1 diabetes mellitus with ketoacidosis without coma: Secondary | ICD-10-CM | POA: Diagnosis not present

## 2018-10-06 DIAGNOSIS — J9601 Acute respiratory failure with hypoxia: Secondary | ICD-10-CM | POA: Diagnosis not present

## 2018-10-06 DIAGNOSIS — F320 Major depressive disorder, single episode, mild: Secondary | ICD-10-CM | POA: Diagnosis not present

## 2018-10-07 DIAGNOSIS — F320 Major depressive disorder, single episode, mild: Secondary | ICD-10-CM | POA: Diagnosis not present

## 2018-10-07 DIAGNOSIS — J9601 Acute respiratory failure with hypoxia: Secondary | ICD-10-CM | POA: Diagnosis not present

## 2018-10-07 DIAGNOSIS — R8290 Unspecified abnormal findings in urine: Secondary | ICD-10-CM | POA: Diagnosis not present

## 2018-10-07 DIAGNOSIS — E1010 Type 1 diabetes mellitus with ketoacidosis without coma: Secondary | ICD-10-CM | POA: Diagnosis not present

## 2018-10-08 DIAGNOSIS — F320 Major depressive disorder, single episode, mild: Secondary | ICD-10-CM | POA: Diagnosis not present

## 2018-10-08 DIAGNOSIS — E1010 Type 1 diabetes mellitus with ketoacidosis without coma: Secondary | ICD-10-CM | POA: Diagnosis not present

## 2018-10-08 DIAGNOSIS — J9601 Acute respiratory failure with hypoxia: Secondary | ICD-10-CM | POA: Diagnosis not present

## 2018-10-08 DIAGNOSIS — R8290 Unspecified abnormal findings in urine: Secondary | ICD-10-CM | POA: Diagnosis not present

## 2018-10-09 DIAGNOSIS — F320 Major depressive disorder, single episode, mild: Secondary | ICD-10-CM | POA: Diagnosis not present

## 2018-10-09 DIAGNOSIS — J9601 Acute respiratory failure with hypoxia: Secondary | ICD-10-CM | POA: Diagnosis not present

## 2018-10-09 DIAGNOSIS — R8290 Unspecified abnormal findings in urine: Secondary | ICD-10-CM | POA: Diagnosis not present

## 2018-10-09 DIAGNOSIS — E1010 Type 1 diabetes mellitus with ketoacidosis without coma: Secondary | ICD-10-CM | POA: Diagnosis not present

## 2018-10-10 DIAGNOSIS — J9601 Acute respiratory failure with hypoxia: Secondary | ICD-10-CM | POA: Diagnosis not present

## 2018-10-10 DIAGNOSIS — E1010 Type 1 diabetes mellitus with ketoacidosis without coma: Secondary | ICD-10-CM | POA: Diagnosis not present

## 2018-10-10 DIAGNOSIS — F320 Major depressive disorder, single episode, mild: Secondary | ICD-10-CM | POA: Diagnosis not present

## 2018-10-10 DIAGNOSIS — R8290 Unspecified abnormal findings in urine: Secondary | ICD-10-CM | POA: Diagnosis not present

## 2018-10-11 DIAGNOSIS — E1010 Type 1 diabetes mellitus with ketoacidosis without coma: Secondary | ICD-10-CM | POA: Diagnosis not present

## 2018-10-11 DIAGNOSIS — R8290 Unspecified abnormal findings in urine: Secondary | ICD-10-CM | POA: Diagnosis not present

## 2018-10-11 DIAGNOSIS — F320 Major depressive disorder, single episode, mild: Secondary | ICD-10-CM | POA: Diagnosis not present

## 2018-10-11 DIAGNOSIS — J9601 Acute respiratory failure with hypoxia: Secondary | ICD-10-CM | POA: Diagnosis not present

## 2018-10-12 DIAGNOSIS — F320 Major depressive disorder, single episode, mild: Secondary | ICD-10-CM | POA: Diagnosis not present

## 2018-10-12 DIAGNOSIS — J9601 Acute respiratory failure with hypoxia: Secondary | ICD-10-CM | POA: Diagnosis not present

## 2018-10-12 DIAGNOSIS — E1010 Type 1 diabetes mellitus with ketoacidosis without coma: Secondary | ICD-10-CM | POA: Diagnosis not present

## 2018-10-12 DIAGNOSIS — R8290 Unspecified abnormal findings in urine: Secondary | ICD-10-CM | POA: Diagnosis not present

## 2018-10-13 DIAGNOSIS — E1310 Other specified diabetes mellitus with ketoacidosis without coma: Secondary | ICD-10-CM

## 2018-10-13 DIAGNOSIS — F319 Bipolar disorder, unspecified: Secondary | ICD-10-CM | POA: Diagnosis not present

## 2018-10-13 DIAGNOSIS — E785 Hyperlipidemia, unspecified: Secondary | ICD-10-CM

## 2018-10-13 DIAGNOSIS — J9601 Acute respiratory failure with hypoxia: Secondary | ICD-10-CM

## 2018-10-13 DIAGNOSIS — F209 Schizophrenia, unspecified: Secondary | ICD-10-CM

## 2018-10-13 DIAGNOSIS — I10 Essential (primary) hypertension: Secondary | ICD-10-CM

## 2018-10-13 DIAGNOSIS — F23 Brief psychotic disorder: Secondary | ICD-10-CM

## 2018-10-13 DIAGNOSIS — N481 Balanitis: Secondary | ICD-10-CM

## 2018-10-14 DIAGNOSIS — F319 Bipolar disorder, unspecified: Secondary | ICD-10-CM | POA: Diagnosis not present

## 2018-10-14 DIAGNOSIS — N481 Balanitis: Secondary | ICD-10-CM | POA: Diagnosis not present

## 2018-10-14 DIAGNOSIS — F23 Brief psychotic disorder: Secondary | ICD-10-CM | POA: Diagnosis not present

## 2018-10-14 DIAGNOSIS — J9601 Acute respiratory failure with hypoxia: Secondary | ICD-10-CM | POA: Diagnosis not present

## 2018-10-15 DIAGNOSIS — J9601 Acute respiratory failure with hypoxia: Secondary | ICD-10-CM | POA: Diagnosis not present

## 2018-10-15 DIAGNOSIS — F319 Bipolar disorder, unspecified: Secondary | ICD-10-CM | POA: Diagnosis not present

## 2018-10-15 DIAGNOSIS — N481 Balanitis: Secondary | ICD-10-CM | POA: Diagnosis not present

## 2018-10-15 DIAGNOSIS — F23 Brief psychotic disorder: Secondary | ICD-10-CM | POA: Diagnosis not present

## 2018-10-16 DIAGNOSIS — N481 Balanitis: Secondary | ICD-10-CM | POA: Diagnosis not present

## 2018-10-16 DIAGNOSIS — F23 Brief psychotic disorder: Secondary | ICD-10-CM | POA: Diagnosis not present

## 2018-10-16 DIAGNOSIS — J9601 Acute respiratory failure with hypoxia: Secondary | ICD-10-CM | POA: Diagnosis not present

## 2018-10-16 DIAGNOSIS — F319 Bipolar disorder, unspecified: Secondary | ICD-10-CM | POA: Diagnosis not present

## 2018-10-17 DIAGNOSIS — J9601 Acute respiratory failure with hypoxia: Secondary | ICD-10-CM | POA: Diagnosis not present

## 2018-10-17 DIAGNOSIS — F319 Bipolar disorder, unspecified: Secondary | ICD-10-CM | POA: Diagnosis not present

## 2018-10-17 DIAGNOSIS — N481 Balanitis: Secondary | ICD-10-CM | POA: Diagnosis not present

## 2018-10-17 DIAGNOSIS — F23 Brief psychotic disorder: Secondary | ICD-10-CM | POA: Diagnosis not present

## 2018-10-18 DIAGNOSIS — F23 Brief psychotic disorder: Secondary | ICD-10-CM | POA: Diagnosis not present

## 2018-10-18 DIAGNOSIS — F319 Bipolar disorder, unspecified: Secondary | ICD-10-CM | POA: Diagnosis not present

## 2018-10-18 DIAGNOSIS — J9601 Acute respiratory failure with hypoxia: Secondary | ICD-10-CM | POA: Diagnosis not present

## 2018-10-18 DIAGNOSIS — N481 Balanitis: Secondary | ICD-10-CM | POA: Diagnosis not present

## 2018-10-19 DIAGNOSIS — F319 Bipolar disorder, unspecified: Secondary | ICD-10-CM | POA: Diagnosis not present

## 2018-10-19 DIAGNOSIS — J9601 Acute respiratory failure with hypoxia: Secondary | ICD-10-CM | POA: Diagnosis not present

## 2018-10-19 DIAGNOSIS — F23 Brief psychotic disorder: Secondary | ICD-10-CM | POA: Diagnosis not present

## 2018-10-19 DIAGNOSIS — N481 Balanitis: Secondary | ICD-10-CM | POA: Diagnosis not present

## 2018-10-20 DIAGNOSIS — J9601 Acute respiratory failure with hypoxia: Secondary | ICD-10-CM | POA: Diagnosis not present

## 2018-10-20 DIAGNOSIS — F319 Bipolar disorder, unspecified: Secondary | ICD-10-CM | POA: Diagnosis not present

## 2018-10-20 DIAGNOSIS — F23 Brief psychotic disorder: Secondary | ICD-10-CM | POA: Diagnosis not present

## 2018-10-20 DIAGNOSIS — N481 Balanitis: Secondary | ICD-10-CM | POA: Diagnosis not present

## 2018-10-21 DIAGNOSIS — N481 Balanitis: Secondary | ICD-10-CM | POA: Diagnosis not present

## 2018-10-21 DIAGNOSIS — J9601 Acute respiratory failure with hypoxia: Secondary | ICD-10-CM | POA: Diagnosis not present

## 2018-10-21 DIAGNOSIS — F319 Bipolar disorder, unspecified: Secondary | ICD-10-CM | POA: Diagnosis not present

## 2018-10-21 DIAGNOSIS — F23 Brief psychotic disorder: Secondary | ICD-10-CM | POA: Diagnosis not present

## 2018-10-22 DIAGNOSIS — F319 Bipolar disorder, unspecified: Secondary | ICD-10-CM | POA: Diagnosis not present

## 2018-10-22 DIAGNOSIS — N481 Balanitis: Secondary | ICD-10-CM | POA: Diagnosis not present

## 2018-10-22 DIAGNOSIS — F23 Brief psychotic disorder: Secondary | ICD-10-CM | POA: Diagnosis not present

## 2018-10-22 DIAGNOSIS — J9601 Acute respiratory failure with hypoxia: Secondary | ICD-10-CM | POA: Diagnosis not present

## 2018-10-23 DIAGNOSIS — N481 Balanitis: Secondary | ICD-10-CM | POA: Diagnosis not present

## 2018-10-23 DIAGNOSIS — F319 Bipolar disorder, unspecified: Secondary | ICD-10-CM | POA: Diagnosis not present

## 2018-10-23 DIAGNOSIS — J9601 Acute respiratory failure with hypoxia: Secondary | ICD-10-CM | POA: Diagnosis not present

## 2018-10-23 DIAGNOSIS — F23 Brief psychotic disorder: Secondary | ICD-10-CM | POA: Diagnosis not present

## 2018-10-24 DIAGNOSIS — N481 Balanitis: Secondary | ICD-10-CM | POA: Diagnosis not present

## 2018-10-24 DIAGNOSIS — J9601 Acute respiratory failure with hypoxia: Secondary | ICD-10-CM | POA: Diagnosis not present

## 2018-10-24 DIAGNOSIS — F319 Bipolar disorder, unspecified: Secondary | ICD-10-CM | POA: Diagnosis not present

## 2018-10-24 DIAGNOSIS — F23 Brief psychotic disorder: Secondary | ICD-10-CM | POA: Diagnosis not present

## 2018-10-25 DIAGNOSIS — F319 Bipolar disorder, unspecified: Secondary | ICD-10-CM | POA: Diagnosis not present

## 2018-10-25 DIAGNOSIS — F23 Brief psychotic disorder: Secondary | ICD-10-CM | POA: Diagnosis not present

## 2018-10-25 DIAGNOSIS — J9601 Acute respiratory failure with hypoxia: Secondary | ICD-10-CM | POA: Diagnosis not present

## 2018-10-25 DIAGNOSIS — N481 Balanitis: Secondary | ICD-10-CM | POA: Diagnosis not present

## 2018-10-26 DIAGNOSIS — F23 Brief psychotic disorder: Secondary | ICD-10-CM | POA: Diagnosis not present

## 2018-10-26 DIAGNOSIS — F319 Bipolar disorder, unspecified: Secondary | ICD-10-CM | POA: Diagnosis not present

## 2018-10-26 DIAGNOSIS — J9601 Acute respiratory failure with hypoxia: Secondary | ICD-10-CM | POA: Diagnosis not present

## 2018-10-26 DIAGNOSIS — N481 Balanitis: Secondary | ICD-10-CM | POA: Diagnosis not present

## 2018-10-27 DIAGNOSIS — J9601 Acute respiratory failure with hypoxia: Secondary | ICD-10-CM | POA: Diagnosis not present

## 2018-10-27 DIAGNOSIS — N481 Balanitis: Secondary | ICD-10-CM | POA: Diagnosis not present

## 2018-10-27 DIAGNOSIS — F23 Brief psychotic disorder: Secondary | ICD-10-CM | POA: Diagnosis not present

## 2018-10-27 DIAGNOSIS — F319 Bipolar disorder, unspecified: Secondary | ICD-10-CM | POA: Diagnosis not present

## 2018-10-28 DIAGNOSIS — N481 Balanitis: Secondary | ICD-10-CM | POA: Diagnosis not present

## 2018-10-28 DIAGNOSIS — F319 Bipolar disorder, unspecified: Secondary | ICD-10-CM | POA: Diagnosis not present

## 2018-10-28 DIAGNOSIS — J9601 Acute respiratory failure with hypoxia: Secondary | ICD-10-CM | POA: Diagnosis not present

## 2018-10-28 DIAGNOSIS — F23 Brief psychotic disorder: Secondary | ICD-10-CM | POA: Diagnosis not present

## 2018-10-29 DIAGNOSIS — N481 Balanitis: Secondary | ICD-10-CM | POA: Diagnosis not present

## 2018-10-29 DIAGNOSIS — F319 Bipolar disorder, unspecified: Secondary | ICD-10-CM | POA: Diagnosis not present

## 2018-10-29 DIAGNOSIS — J9601 Acute respiratory failure with hypoxia: Secondary | ICD-10-CM | POA: Diagnosis not present

## 2018-10-29 DIAGNOSIS — F23 Brief psychotic disorder: Secondary | ICD-10-CM | POA: Diagnosis not present

## 2018-10-30 DIAGNOSIS — F319 Bipolar disorder, unspecified: Secondary | ICD-10-CM | POA: Diagnosis not present

## 2018-10-30 DIAGNOSIS — F23 Brief psychotic disorder: Secondary | ICD-10-CM | POA: Diagnosis not present

## 2018-10-30 DIAGNOSIS — N481 Balanitis: Secondary | ICD-10-CM | POA: Diagnosis not present

## 2018-10-30 DIAGNOSIS — J9601 Acute respiratory failure with hypoxia: Secondary | ICD-10-CM | POA: Diagnosis not present

## 2018-10-31 DIAGNOSIS — N481 Balanitis: Secondary | ICD-10-CM | POA: Diagnosis not present

## 2018-10-31 DIAGNOSIS — F319 Bipolar disorder, unspecified: Secondary | ICD-10-CM | POA: Diagnosis not present

## 2018-10-31 DIAGNOSIS — J9601 Acute respiratory failure with hypoxia: Secondary | ICD-10-CM | POA: Diagnosis not present

## 2018-10-31 DIAGNOSIS — F23 Brief psychotic disorder: Secondary | ICD-10-CM | POA: Diagnosis not present

## 2018-11-01 DIAGNOSIS — F319 Bipolar disorder, unspecified: Secondary | ICD-10-CM | POA: Diagnosis not present

## 2018-11-01 DIAGNOSIS — J9601 Acute respiratory failure with hypoxia: Secondary | ICD-10-CM | POA: Diagnosis not present

## 2018-11-01 DIAGNOSIS — N481 Balanitis: Secondary | ICD-10-CM | POA: Diagnosis not present

## 2018-11-01 DIAGNOSIS — F23 Brief psychotic disorder: Secondary | ICD-10-CM | POA: Diagnosis not present

## 2018-11-02 DIAGNOSIS — F23 Brief psychotic disorder: Secondary | ICD-10-CM | POA: Diagnosis not present

## 2018-11-02 DIAGNOSIS — F319 Bipolar disorder, unspecified: Secondary | ICD-10-CM | POA: Diagnosis not present

## 2018-11-02 DIAGNOSIS — J9601 Acute respiratory failure with hypoxia: Secondary | ICD-10-CM | POA: Diagnosis not present

## 2018-11-02 DIAGNOSIS — N481 Balanitis: Secondary | ICD-10-CM | POA: Diagnosis not present

## 2018-11-03 DIAGNOSIS — F319 Bipolar disorder, unspecified: Secondary | ICD-10-CM | POA: Diagnosis not present

## 2018-11-03 DIAGNOSIS — N481 Balanitis: Secondary | ICD-10-CM | POA: Diagnosis not present

## 2018-11-03 DIAGNOSIS — J9601 Acute respiratory failure with hypoxia: Secondary | ICD-10-CM | POA: Diagnosis not present

## 2018-11-03 DIAGNOSIS — F23 Brief psychotic disorder: Secondary | ICD-10-CM | POA: Diagnosis not present

## 2018-11-04 DIAGNOSIS — J9601 Acute respiratory failure with hypoxia: Secondary | ICD-10-CM | POA: Diagnosis not present

## 2018-11-04 DIAGNOSIS — F319 Bipolar disorder, unspecified: Secondary | ICD-10-CM | POA: Diagnosis not present

## 2018-11-04 DIAGNOSIS — N481 Balanitis: Secondary | ICD-10-CM | POA: Diagnosis not present

## 2018-11-04 DIAGNOSIS — F23 Brief psychotic disorder: Secondary | ICD-10-CM | POA: Diagnosis not present

## 2018-11-05 DIAGNOSIS — F23 Brief psychotic disorder: Secondary | ICD-10-CM | POA: Diagnosis not present

## 2018-11-05 DIAGNOSIS — N481 Balanitis: Secondary | ICD-10-CM | POA: Diagnosis not present

## 2018-11-05 DIAGNOSIS — F319 Bipolar disorder, unspecified: Secondary | ICD-10-CM | POA: Diagnosis not present

## 2018-11-05 DIAGNOSIS — J9601 Acute respiratory failure with hypoxia: Secondary | ICD-10-CM | POA: Diagnosis not present

## 2018-11-06 DIAGNOSIS — N481 Balanitis: Secondary | ICD-10-CM | POA: Diagnosis not present

## 2018-11-06 DIAGNOSIS — J9601 Acute respiratory failure with hypoxia: Secondary | ICD-10-CM | POA: Diagnosis not present

## 2018-11-06 DIAGNOSIS — F23 Brief psychotic disorder: Secondary | ICD-10-CM | POA: Diagnosis not present

## 2018-11-06 DIAGNOSIS — F319 Bipolar disorder, unspecified: Secondary | ICD-10-CM | POA: Diagnosis not present

## 2018-11-07 DIAGNOSIS — F319 Bipolar disorder, unspecified: Secondary | ICD-10-CM | POA: Diagnosis not present

## 2018-11-07 DIAGNOSIS — J9601 Acute respiratory failure with hypoxia: Secondary | ICD-10-CM | POA: Diagnosis not present

## 2018-11-07 DIAGNOSIS — F23 Brief psychotic disorder: Secondary | ICD-10-CM | POA: Diagnosis not present

## 2018-11-07 DIAGNOSIS — N481 Balanitis: Secondary | ICD-10-CM | POA: Diagnosis not present

## 2018-11-08 DIAGNOSIS — F319 Bipolar disorder, unspecified: Secondary | ICD-10-CM | POA: Diagnosis not present

## 2018-11-08 DIAGNOSIS — F23 Brief psychotic disorder: Secondary | ICD-10-CM | POA: Diagnosis not present

## 2018-11-08 DIAGNOSIS — J9601 Acute respiratory failure with hypoxia: Secondary | ICD-10-CM | POA: Diagnosis not present

## 2018-11-08 DIAGNOSIS — N481 Balanitis: Secondary | ICD-10-CM | POA: Diagnosis not present

## 2018-11-09 DIAGNOSIS — J9601 Acute respiratory failure with hypoxia: Secondary | ICD-10-CM | POA: Diagnosis not present

## 2018-11-09 DIAGNOSIS — F23 Brief psychotic disorder: Secondary | ICD-10-CM | POA: Diagnosis not present

## 2018-11-09 DIAGNOSIS — N481 Balanitis: Secondary | ICD-10-CM | POA: Diagnosis not present

## 2018-11-09 DIAGNOSIS — F319 Bipolar disorder, unspecified: Secondary | ICD-10-CM | POA: Diagnosis not present

## 2018-11-10 DIAGNOSIS — N481 Balanitis: Secondary | ICD-10-CM | POA: Diagnosis not present

## 2018-11-10 DIAGNOSIS — J9601 Acute respiratory failure with hypoxia: Secondary | ICD-10-CM | POA: Diagnosis not present

## 2018-11-10 DIAGNOSIS — F23 Brief psychotic disorder: Secondary | ICD-10-CM | POA: Diagnosis not present

## 2018-11-10 DIAGNOSIS — F319 Bipolar disorder, unspecified: Secondary | ICD-10-CM | POA: Diagnosis not present

## 2018-11-11 DIAGNOSIS — J9601 Acute respiratory failure with hypoxia: Secondary | ICD-10-CM | POA: Diagnosis not present

## 2018-11-11 DIAGNOSIS — F319 Bipolar disorder, unspecified: Secondary | ICD-10-CM | POA: Diagnosis not present

## 2018-11-11 DIAGNOSIS — F23 Brief psychotic disorder: Secondary | ICD-10-CM | POA: Diagnosis not present

## 2018-11-11 DIAGNOSIS — N481 Balanitis: Secondary | ICD-10-CM | POA: Diagnosis not present

## 2018-11-12 DIAGNOSIS — F319 Bipolar disorder, unspecified: Secondary | ICD-10-CM | POA: Diagnosis not present

## 2018-11-12 DIAGNOSIS — F23 Brief psychotic disorder: Secondary | ICD-10-CM | POA: Diagnosis not present

## 2018-11-12 DIAGNOSIS — N481 Balanitis: Secondary | ICD-10-CM | POA: Diagnosis not present

## 2018-11-12 DIAGNOSIS — J9601 Acute respiratory failure with hypoxia: Secondary | ICD-10-CM | POA: Diagnosis not present

## 2018-11-13 DIAGNOSIS — F23 Brief psychotic disorder: Secondary | ICD-10-CM | POA: Diagnosis not present

## 2018-11-13 DIAGNOSIS — F319 Bipolar disorder, unspecified: Secondary | ICD-10-CM | POA: Diagnosis not present

## 2018-11-13 DIAGNOSIS — N481 Balanitis: Secondary | ICD-10-CM | POA: Diagnosis not present

## 2018-11-13 DIAGNOSIS — J9601 Acute respiratory failure with hypoxia: Secondary | ICD-10-CM | POA: Diagnosis not present

## 2018-11-14 DIAGNOSIS — J9601 Acute respiratory failure with hypoxia: Secondary | ICD-10-CM | POA: Diagnosis not present

## 2018-11-14 DIAGNOSIS — N481 Balanitis: Secondary | ICD-10-CM | POA: Diagnosis not present

## 2018-11-14 DIAGNOSIS — F319 Bipolar disorder, unspecified: Secondary | ICD-10-CM | POA: Diagnosis not present

## 2018-11-14 DIAGNOSIS — F23 Brief psychotic disorder: Secondary | ICD-10-CM | POA: Diagnosis not present

## 2018-11-15 DIAGNOSIS — J9601 Acute respiratory failure with hypoxia: Secondary | ICD-10-CM | POA: Diagnosis not present

## 2018-11-15 DIAGNOSIS — N481 Balanitis: Secondary | ICD-10-CM | POA: Diagnosis not present

## 2018-11-15 DIAGNOSIS — F23 Brief psychotic disorder: Secondary | ICD-10-CM | POA: Diagnosis not present

## 2018-11-15 DIAGNOSIS — F319 Bipolar disorder, unspecified: Secondary | ICD-10-CM | POA: Diagnosis not present

## 2018-11-16 DIAGNOSIS — F23 Brief psychotic disorder: Secondary | ICD-10-CM | POA: Diagnosis not present

## 2018-11-16 DIAGNOSIS — F319 Bipolar disorder, unspecified: Secondary | ICD-10-CM | POA: Diagnosis not present

## 2018-11-16 DIAGNOSIS — J9601 Acute respiratory failure with hypoxia: Secondary | ICD-10-CM | POA: Diagnosis not present

## 2018-11-16 DIAGNOSIS — N481 Balanitis: Secondary | ICD-10-CM | POA: Diagnosis not present

## 2018-11-17 DIAGNOSIS — J9601 Acute respiratory failure with hypoxia: Secondary | ICD-10-CM | POA: Diagnosis not present

## 2018-11-17 DIAGNOSIS — F319 Bipolar disorder, unspecified: Secondary | ICD-10-CM | POA: Diagnosis not present

## 2018-11-17 DIAGNOSIS — N481 Balanitis: Secondary | ICD-10-CM | POA: Diagnosis not present

## 2018-11-17 DIAGNOSIS — F23 Brief psychotic disorder: Secondary | ICD-10-CM | POA: Diagnosis not present

## 2018-11-18 DIAGNOSIS — J9601 Acute respiratory failure with hypoxia: Secondary | ICD-10-CM | POA: Diagnosis not present

## 2018-11-18 DIAGNOSIS — N481 Balanitis: Secondary | ICD-10-CM | POA: Diagnosis not present

## 2018-11-18 DIAGNOSIS — F23 Brief psychotic disorder: Secondary | ICD-10-CM | POA: Diagnosis not present

## 2018-11-18 DIAGNOSIS — F319 Bipolar disorder, unspecified: Secondary | ICD-10-CM | POA: Diagnosis not present

## 2018-11-19 DIAGNOSIS — F23 Brief psychotic disorder: Secondary | ICD-10-CM | POA: Diagnosis not present

## 2018-11-19 DIAGNOSIS — J9601 Acute respiratory failure with hypoxia: Secondary | ICD-10-CM | POA: Diagnosis not present

## 2018-11-19 DIAGNOSIS — N481 Balanitis: Secondary | ICD-10-CM | POA: Diagnosis not present

## 2018-11-19 DIAGNOSIS — F319 Bipolar disorder, unspecified: Secondary | ICD-10-CM | POA: Diagnosis not present

## 2018-11-20 DIAGNOSIS — F319 Bipolar disorder, unspecified: Secondary | ICD-10-CM | POA: Diagnosis not present

## 2018-11-20 DIAGNOSIS — F23 Brief psychotic disorder: Secondary | ICD-10-CM | POA: Diagnosis not present

## 2018-11-20 DIAGNOSIS — J9601 Acute respiratory failure with hypoxia: Secondary | ICD-10-CM | POA: Diagnosis not present

## 2018-11-20 DIAGNOSIS — N481 Balanitis: Secondary | ICD-10-CM | POA: Diagnosis not present

## 2018-11-21 DIAGNOSIS — F319 Bipolar disorder, unspecified: Secondary | ICD-10-CM | POA: Diagnosis not present

## 2018-11-21 DIAGNOSIS — N481 Balanitis: Secondary | ICD-10-CM | POA: Diagnosis not present

## 2018-11-21 DIAGNOSIS — F23 Brief psychotic disorder: Secondary | ICD-10-CM | POA: Diagnosis not present

## 2018-11-21 DIAGNOSIS — J9601 Acute respiratory failure with hypoxia: Secondary | ICD-10-CM | POA: Diagnosis not present

## 2018-11-22 DIAGNOSIS — F23 Brief psychotic disorder: Secondary | ICD-10-CM | POA: Diagnosis not present

## 2018-11-22 DIAGNOSIS — N481 Balanitis: Secondary | ICD-10-CM | POA: Diagnosis not present

## 2018-11-22 DIAGNOSIS — F319 Bipolar disorder, unspecified: Secondary | ICD-10-CM | POA: Diagnosis not present

## 2018-11-22 DIAGNOSIS — J9601 Acute respiratory failure with hypoxia: Secondary | ICD-10-CM | POA: Diagnosis not present

## 2018-11-23 DIAGNOSIS — F319 Bipolar disorder, unspecified: Secondary | ICD-10-CM | POA: Diagnosis not present

## 2018-11-23 DIAGNOSIS — J9601 Acute respiratory failure with hypoxia: Secondary | ICD-10-CM | POA: Diagnosis not present

## 2018-11-23 DIAGNOSIS — F23 Brief psychotic disorder: Secondary | ICD-10-CM | POA: Diagnosis not present

## 2018-11-23 DIAGNOSIS — N481 Balanitis: Secondary | ICD-10-CM | POA: Diagnosis not present
